# Patient Record
Sex: Female | Born: 1940
Health system: Southern US, Community
[De-identification: ages and names within clinical notes are randomized; demographics above are authoritative.]

## PROBLEM LIST (undated history)

## (undated) DIAGNOSIS — K589 Irritable bowel syndrome without diarrhea: Secondary | ICD-10-CM

## (undated) DIAGNOSIS — Z9289 Personal history of other medical treatment: Secondary | ICD-10-CM

## (undated) DIAGNOSIS — E785 Hyperlipidemia, unspecified: Secondary | ICD-10-CM

## (undated) DIAGNOSIS — R001 Bradycardia, unspecified: Secondary | ICD-10-CM

## (undated) DIAGNOSIS — R197 Diarrhea, unspecified: Secondary | ICD-10-CM

## (undated) DIAGNOSIS — Z860100 Personal history of colon polyps, unspecified: Secondary | ICD-10-CM

## (undated) DIAGNOSIS — I471 Supraventricular tachycardia: Secondary | ICD-10-CM

## (undated) DIAGNOSIS — I4719 Other supraventricular tachycardia: Secondary | ICD-10-CM

## (undated) DIAGNOSIS — E669 Obesity, unspecified: Secondary | ICD-10-CM

## (undated) DIAGNOSIS — K5732 Diverticulitis of large intestine without perforation or abscess without bleeding: Secondary | ICD-10-CM

## (undated) DIAGNOSIS — R931 Abnormal findings on diagnostic imaging of heart and coronary circulation: Secondary | ICD-10-CM

## (undated) DIAGNOSIS — D472 Monoclonal gammopathy: Secondary | ICD-10-CM

## (undated) DIAGNOSIS — K2970 Gastritis, unspecified, without bleeding: Secondary | ICD-10-CM

## (undated) DIAGNOSIS — R413 Other amnesia: Secondary | ICD-10-CM

## (undated) DIAGNOSIS — K449 Diaphragmatic hernia without obstruction or gangrene: Secondary | ICD-10-CM

## (undated) DIAGNOSIS — R296 Repeated falls: Secondary | ICD-10-CM

## (undated) DIAGNOSIS — I2510 Atherosclerotic heart disease of native coronary artery without angina pectoris: Secondary | ICD-10-CM

## (undated) DIAGNOSIS — E8881 Metabolic syndrome: Secondary | ICD-10-CM

## (undated) DIAGNOSIS — I639 Cerebral infarction, unspecified: Secondary | ICD-10-CM

## (undated) DIAGNOSIS — G8929 Other chronic pain: Secondary | ICD-10-CM

## (undated) DIAGNOSIS — I951 Orthostatic hypotension: Secondary | ICD-10-CM

## (undated) DIAGNOSIS — I4891 Unspecified atrial fibrillation: Secondary | ICD-10-CM

## (undated) DIAGNOSIS — G4700 Insomnia, unspecified: Secondary | ICD-10-CM

## (undated) DIAGNOSIS — G4733 Obstructive sleep apnea (adult) (pediatric): Secondary | ICD-10-CM

## (undated) DIAGNOSIS — J189 Pneumonia, unspecified organism: Secondary | ICD-10-CM

## (undated) DIAGNOSIS — G5600 Carpal tunnel syndrome, unspecified upper limb: Secondary | ICD-10-CM

## (undated) DIAGNOSIS — R32 Unspecified urinary incontinence: Secondary | ICD-10-CM

## (undated) DIAGNOSIS — D539 Nutritional anemia, unspecified: Secondary | ICD-10-CM

## (undated) DIAGNOSIS — K219 Gastro-esophageal reflux disease without esophagitis: Secondary | ICD-10-CM

## (undated) DIAGNOSIS — K222 Esophageal obstruction: Secondary | ICD-10-CM

## (undated) DIAGNOSIS — M1990 Unspecified osteoarthritis, unspecified site: Secondary | ICD-10-CM

## (undated) DIAGNOSIS — I482 Chronic atrial fibrillation, unspecified: Secondary | ICD-10-CM

## (undated) DIAGNOSIS — D649 Anemia, unspecified: Secondary | ICD-10-CM

## (undated) DIAGNOSIS — D509 Iron deficiency anemia, unspecified: Secondary | ICD-10-CM

## (undated) DIAGNOSIS — F419 Anxiety disorder, unspecified: Secondary | ICD-10-CM

## (undated) DIAGNOSIS — M549 Dorsalgia, unspecified: Secondary | ICD-10-CM

## (undated) DIAGNOSIS — I5032 Chronic diastolic (congestive) heart failure: Secondary | ICD-10-CM

## (undated) DIAGNOSIS — I10 Essential (primary) hypertension: Secondary | ICD-10-CM

## (undated) DIAGNOSIS — I209 Angina pectoris, unspecified: Secondary | ICD-10-CM

## (undated) DIAGNOSIS — F329 Major depressive disorder, single episode, unspecified: Secondary | ICD-10-CM

## (undated) DIAGNOSIS — F32A Depression, unspecified: Secondary | ICD-10-CM

## (undated) HISTORY — DX: Personal history of other medical treatment: Z92.89

## (undated) HISTORY — PX: DILATION AND CURETTAGE OF UTERUS: SHX78

## (undated) HISTORY — DX: Obesity, unspecified: E66.9

## (undated) HISTORY — DX: Chronic atrial fibrillation, unspecified: I48.20

## (undated) HISTORY — DX: Essential (primary) hypertension: I10

## (undated) HISTORY — DX: Metabolic syndrome: E88.81

## (undated) HISTORY — DX: Diverticulitis of large intestine without perforation or abscess without bleeding: K57.32

## (undated) HISTORY — DX: Hyperlipidemia, unspecified: E78.5

## (undated) HISTORY — DX: Gastritis, unspecified, without bleeding: K29.70

## (undated) HISTORY — DX: Diaphragmatic hernia without obstruction or gangrene: K44.9

## (undated) HISTORY — PX: JOINT REPLACEMENT: SHX530

## (undated) HISTORY — DX: Nutritional anemia, unspecified: D53.9

## (undated) HISTORY — DX: Carpal tunnel syndrome, unspecified upper limb: G56.00

## (undated) HISTORY — DX: Irritable bowel syndrome, unspecified: K58.9

## (undated) HISTORY — DX: Personal history of colonic polyps: Z86.010

## (undated) HISTORY — DX: Depression, unspecified: F32.A

## (undated) HISTORY — DX: Obstructive sleep apnea (adult) (pediatric): G47.33

## (undated) HISTORY — DX: Diarrhea, unspecified: R19.7

## (undated) HISTORY — DX: Esophageal obstruction: K22.2

## (undated) HISTORY — PX: TOTAL KNEE ARTHROPLASTY: SHX125

## (undated) HISTORY — DX: Gastro-esophageal reflux disease without esophagitis: K21.9

## (undated) HISTORY — PX: CATARACT EXTRACTION: SUR2

## (undated) HISTORY — DX: Abnormal findings on diagnostic imaging of heart and coronary circulation: R93.1

## (undated) HISTORY — PX: CORONARY STENT PLACEMENT: SHX1402

## (undated) HISTORY — PX: ESOPHAGOGASTRODUODENOSCOPY (EGD) WITH ESOPHAGEAL DILATION: SHX5812

## (undated) HISTORY — DX: Iron deficiency anemia, unspecified: D50.9

## (undated) HISTORY — PX: BACK SURGERY: SHX140

## (undated) HISTORY — DX: Insomnia, unspecified: G47.00

## (undated) HISTORY — DX: Personal history of colon polyps, unspecified: Z86.0100

## (undated) HISTORY — DX: Angina pectoris, unspecified: I20.9

## (undated) HISTORY — DX: Major depressive disorder, single episode, unspecified: F32.9

## (undated) HISTORY — DX: Other amnesia: R41.3

## (undated) HISTORY — DX: Unspecified urinary incontinence: R32

---

## 1979-08-22 HISTORY — PX: CARPAL TUNNEL RELEASE: SHX101

## 1993-12-21 HISTORY — PX: KNEE ARTHROSCOPY: SHX127

## 1998-04-02 ENCOUNTER — Other Ambulatory Visit: Admission: RE | Admit: 1998-04-02 | Discharge: 1998-04-02 | Payer: Self-pay | Admitting: Obstetrics and Gynecology

## 1998-07-31 ENCOUNTER — Ambulatory Visit (HOSPITAL_COMMUNITY): Admission: RE | Admit: 1998-07-31 | Discharge: 1998-07-31 | Payer: Self-pay | Admitting: Orthopedic Surgery

## 1998-10-07 ENCOUNTER — Encounter: Payer: Self-pay | Admitting: Orthopedic Surgery

## 1998-10-08 ENCOUNTER — Inpatient Hospital Stay (HOSPITAL_COMMUNITY): Admission: RE | Admit: 1998-10-08 | Discharge: 1998-10-10 | Payer: Self-pay | Admitting: Orthopedic Surgery

## 1998-10-10 ENCOUNTER — Inpatient Hospital Stay (HOSPITAL_COMMUNITY)
Admission: RE | Admit: 1998-10-10 | Discharge: 1998-10-16 | Payer: Self-pay | Admitting: Physical Medicine and Rehabilitation

## 1998-10-23 ENCOUNTER — Encounter: Admission: RE | Admit: 1998-10-23 | Discharge: 1999-01-21 | Payer: Self-pay | Admitting: Orthopedic Surgery

## 1999-01-24 ENCOUNTER — Encounter: Admission: RE | Admit: 1999-01-24 | Discharge: 1999-04-24 | Payer: Self-pay | Admitting: Orthopedic Surgery

## 1999-03-07 ENCOUNTER — Emergency Department (HOSPITAL_COMMUNITY): Admission: EM | Admit: 1999-03-07 | Discharge: 1999-03-07 | Payer: Self-pay | Admitting: Emergency Medicine

## 1999-03-07 ENCOUNTER — Encounter: Payer: Self-pay | Admitting: Emergency Medicine

## 1999-03-08 ENCOUNTER — Encounter: Payer: Self-pay | Admitting: Emergency Medicine

## 1999-03-13 ENCOUNTER — Ambulatory Visit (HOSPITAL_COMMUNITY): Admission: RE | Admit: 1999-03-13 | Discharge: 1999-03-13 | Payer: Self-pay | Admitting: Urology

## 1999-03-13 ENCOUNTER — Encounter: Payer: Self-pay | Admitting: Urology

## 1999-03-24 ENCOUNTER — Other Ambulatory Visit: Admission: RE | Admit: 1999-03-24 | Discharge: 1999-03-24 | Payer: Self-pay | Admitting: Obstetrics and Gynecology

## 1999-08-22 HISTORY — PX: CATARACT EXTRACTION W/ INTRAOCULAR LENS  IMPLANT, BILATERAL: SHX1307

## 2000-05-27 ENCOUNTER — Other Ambulatory Visit: Admission: RE | Admit: 2000-05-27 | Discharge: 2000-05-27 | Payer: Self-pay | Admitting: Obstetrics and Gynecology

## 2000-06-15 ENCOUNTER — Encounter: Payer: Self-pay | Admitting: Orthopedic Surgery

## 2000-06-15 ENCOUNTER — Ambulatory Visit (HOSPITAL_COMMUNITY): Admission: RE | Admit: 2000-06-15 | Discharge: 2000-06-15 | Payer: Self-pay | Admitting: Orthopedic Surgery

## 2000-06-29 ENCOUNTER — Encounter: Payer: Self-pay | Admitting: Orthopedic Surgery

## 2000-06-29 ENCOUNTER — Ambulatory Visit (HOSPITAL_COMMUNITY): Admission: RE | Admit: 2000-06-29 | Discharge: 2000-06-29 | Payer: Self-pay | Admitting: Orthopedic Surgery

## 2000-07-14 ENCOUNTER — Ambulatory Visit (HOSPITAL_COMMUNITY): Admission: RE | Admit: 2000-07-14 | Discharge: 2000-07-14 | Payer: Self-pay | Admitting: Orthopedic Surgery

## 2000-07-14 ENCOUNTER — Encounter: Payer: Self-pay | Admitting: Orthopedic Surgery

## 2000-10-23 ENCOUNTER — Emergency Department (HOSPITAL_COMMUNITY): Admission: EM | Admit: 2000-10-23 | Discharge: 2000-10-23 | Payer: Self-pay | Admitting: Emergency Medicine

## 2000-10-23 ENCOUNTER — Encounter: Payer: Self-pay | Admitting: Emergency Medicine

## 2000-10-23 ENCOUNTER — Encounter: Payer: Self-pay | Admitting: Specialist

## 2000-11-16 ENCOUNTER — Encounter: Admission: RE | Admit: 2000-11-16 | Discharge: 2000-12-08 | Payer: Self-pay | Admitting: Specialist

## 2001-01-31 ENCOUNTER — Encounter
Admission: RE | Admit: 2001-01-31 | Discharge: 2001-02-21 | Payer: Self-pay | Admitting: Physical Medicine and Rehabilitation

## 2001-02-23 ENCOUNTER — Ambulatory Visit (HOSPITAL_COMMUNITY): Admission: RE | Admit: 2001-02-23 | Discharge: 2001-02-23 | Payer: Self-pay | Admitting: Gastroenterology

## 2001-02-23 ENCOUNTER — Encounter: Payer: Self-pay | Admitting: Gastroenterology

## 2001-04-13 ENCOUNTER — Ambulatory Visit (HOSPITAL_COMMUNITY): Admission: RE | Admit: 2001-04-13 | Discharge: 2001-04-13 | Payer: Self-pay | Admitting: Specialist

## 2001-04-13 ENCOUNTER — Encounter: Payer: Self-pay | Admitting: Specialist

## 2001-12-06 ENCOUNTER — Inpatient Hospital Stay (HOSPITAL_COMMUNITY): Admission: EM | Admit: 2001-12-06 | Discharge: 2001-12-10 | Payer: Self-pay | Admitting: Emergency Medicine

## 2001-12-06 ENCOUNTER — Encounter: Payer: Self-pay | Admitting: Emergency Medicine

## 2001-12-08 ENCOUNTER — Encounter: Payer: Self-pay | Admitting: Emergency Medicine

## 2001-12-21 HISTORY — PX: LAPAROSCOPIC CHOLECYSTECTOMY: SUR755

## 2001-12-28 ENCOUNTER — Encounter: Admission: RE | Admit: 2001-12-28 | Discharge: 2001-12-28 | Payer: Self-pay | Admitting: Family Medicine

## 2002-01-04 ENCOUNTER — Other Ambulatory Visit: Admission: RE | Admit: 2002-01-04 | Discharge: 2002-01-04 | Payer: Self-pay | Admitting: Obstetrics and Gynecology

## 2002-01-11 ENCOUNTER — Encounter: Payer: Self-pay | Admitting: Surgery

## 2002-01-11 ENCOUNTER — Encounter (INDEPENDENT_AMBULATORY_CARE_PROVIDER_SITE_OTHER): Payer: Self-pay | Admitting: Specialist

## 2002-01-11 ENCOUNTER — Ambulatory Visit (HOSPITAL_COMMUNITY): Admission: RE | Admit: 2002-01-11 | Discharge: 2002-01-12 | Payer: Self-pay | Admitting: Surgery

## 2002-03-25 ENCOUNTER — Ambulatory Visit (HOSPITAL_COMMUNITY): Admission: RE | Admit: 2002-03-25 | Discharge: 2002-03-25 | Payer: Self-pay | Admitting: Family Medicine

## 2002-03-25 ENCOUNTER — Encounter: Payer: Self-pay | Admitting: Family Medicine

## 2002-04-04 ENCOUNTER — Encounter: Payer: Self-pay | Admitting: Orthopedic Surgery

## 2002-04-05 ENCOUNTER — Inpatient Hospital Stay (HOSPITAL_COMMUNITY): Admission: RE | Admit: 2002-04-05 | Discharge: 2002-04-11 | Payer: Self-pay | Admitting: Orthopedic Surgery

## 2002-04-11 ENCOUNTER — Inpatient Hospital Stay (HOSPITAL_COMMUNITY)
Admission: AD | Admit: 2002-04-11 | Discharge: 2002-04-15 | Payer: Self-pay | Admitting: Physical Medicine & Rehabilitation

## 2002-05-04 ENCOUNTER — Encounter: Admission: RE | Admit: 2002-05-04 | Discharge: 2002-06-15 | Payer: Self-pay | Admitting: Orthopedic Surgery

## 2003-10-25 ENCOUNTER — Encounter (INDEPENDENT_AMBULATORY_CARE_PROVIDER_SITE_OTHER): Payer: Self-pay | Admitting: Gastroenterology

## 2004-03-29 ENCOUNTER — Ambulatory Visit (HOSPITAL_COMMUNITY): Admission: RE | Admit: 2004-03-29 | Discharge: 2004-03-29 | Payer: Self-pay | Admitting: Neurology

## 2004-04-13 ENCOUNTER — Emergency Department (HOSPITAL_COMMUNITY): Admission: EM | Admit: 2004-04-13 | Discharge: 2004-04-13 | Payer: Self-pay | Admitting: Emergency Medicine

## 2004-05-08 ENCOUNTER — Ambulatory Visit (HOSPITAL_COMMUNITY): Admission: RE | Admit: 2004-05-08 | Discharge: 2004-05-08 | Payer: Self-pay | Admitting: Gastroenterology

## 2004-06-10 ENCOUNTER — Encounter: Admission: RE | Admit: 2004-06-10 | Discharge: 2004-06-10 | Payer: Self-pay | Admitting: Specialist

## 2004-08-28 ENCOUNTER — Emergency Department (HOSPITAL_COMMUNITY): Admission: EM | Admit: 2004-08-28 | Discharge: 2004-08-28 | Payer: Self-pay | Admitting: Emergency Medicine

## 2004-08-28 ENCOUNTER — Inpatient Hospital Stay (HOSPITAL_COMMUNITY): Admission: AD | Admit: 2004-08-28 | Discharge: 2004-09-02 | Payer: Self-pay | Admitting: Internal Medicine

## 2004-08-29 ENCOUNTER — Encounter: Payer: Self-pay | Admitting: Cardiology

## 2004-09-25 ENCOUNTER — Ambulatory Visit (HOSPITAL_COMMUNITY): Admission: RE | Admit: 2004-09-25 | Discharge: 2004-09-25 | Payer: Self-pay | Admitting: Cardiology

## 2004-12-01 ENCOUNTER — Encounter: Admission: RE | Admit: 2004-12-01 | Discharge: 2004-12-29 | Payer: Self-pay | Admitting: Neurosurgery

## 2005-03-10 ENCOUNTER — Ambulatory Visit: Admission: RE | Admit: 2005-03-10 | Discharge: 2005-03-10 | Payer: Self-pay | Admitting: Family Medicine

## 2005-08-31 ENCOUNTER — Ambulatory Visit (HOSPITAL_COMMUNITY): Admission: RE | Admit: 2005-08-31 | Discharge: 2005-08-31 | Payer: Self-pay | Admitting: Gastroenterology

## 2005-09-03 ENCOUNTER — Ambulatory Visit: Payer: Self-pay | Admitting: Gastroenterology

## 2005-09-07 ENCOUNTER — Ambulatory Visit: Payer: Self-pay | Admitting: Cardiology

## 2006-09-21 ENCOUNTER — Ambulatory Visit: Payer: Self-pay | Admitting: Cardiology

## 2006-10-06 ENCOUNTER — Ambulatory Visit: Payer: Self-pay | Admitting: Pulmonary Disease

## 2006-10-12 ENCOUNTER — Encounter: Payer: Self-pay | Admitting: Cardiology

## 2006-10-12 ENCOUNTER — Ambulatory Visit: Payer: Self-pay

## 2006-11-03 ENCOUNTER — Ambulatory Visit (HOSPITAL_BASED_OUTPATIENT_CLINIC_OR_DEPARTMENT_OTHER): Admission: RE | Admit: 2006-11-03 | Discharge: 2006-11-03 | Payer: Self-pay | Admitting: Cardiology

## 2006-11-13 ENCOUNTER — Ambulatory Visit: Payer: Self-pay | Admitting: Pulmonary Disease

## 2006-11-23 ENCOUNTER — Ambulatory Visit: Payer: Self-pay | Admitting: Pulmonary Disease

## 2006-12-06 ENCOUNTER — Ambulatory Visit: Payer: Self-pay | Admitting: Cardiology

## 2007-07-07 ENCOUNTER — Ambulatory Visit: Payer: Self-pay | Admitting: Pulmonary Disease

## 2007-08-02 ENCOUNTER — Ambulatory Visit (HOSPITAL_BASED_OUTPATIENT_CLINIC_OR_DEPARTMENT_OTHER): Admission: RE | Admit: 2007-08-02 | Discharge: 2007-08-02 | Payer: Self-pay | Admitting: Pulmonary Disease

## 2007-08-02 ENCOUNTER — Ambulatory Visit: Payer: Self-pay | Admitting: Pulmonary Disease

## 2007-08-18 ENCOUNTER — Ambulatory Visit: Payer: Self-pay | Admitting: Cardiology

## 2007-10-07 ENCOUNTER — Ambulatory Visit: Payer: Self-pay | Admitting: Pulmonary Disease

## 2007-10-07 ENCOUNTER — Encounter: Payer: Self-pay | Admitting: Internal Medicine

## 2007-10-31 ENCOUNTER — Ambulatory Visit: Payer: Self-pay | Admitting: Pulmonary Disease

## 2007-11-28 ENCOUNTER — Ambulatory Visit: Payer: Self-pay | Admitting: Cardiology

## 2008-01-09 ENCOUNTER — Ambulatory Visit (HOSPITAL_COMMUNITY): Admission: RE | Admit: 2008-01-09 | Discharge: 2008-01-09 | Payer: Self-pay | Admitting: Cardiology

## 2008-01-09 ENCOUNTER — Ambulatory Visit: Payer: Self-pay | Admitting: Cardiology

## 2008-04-18 ENCOUNTER — Ambulatory Visit: Payer: Self-pay | Admitting: Cardiology

## 2008-04-18 LAB — CONVERTED CEMR LAB
BUN: 14 mg/dL (ref 6–23)
Basophils Absolute: 0 10*3/uL (ref 0.0–0.1)
Basophils Relative: 0 % (ref 0.0–1.0)
CO2: 29 meq/L (ref 19–32)
Calcium: 9.1 mg/dL (ref 8.4–10.5)
Chloride: 102 meq/L (ref 96–112)
Creatinine, Ser: 1 mg/dL (ref 0.4–1.2)
Eosinophils Absolute: 0.2 10*3/uL (ref 0.0–0.7)
Eosinophils Relative: 2.5 % (ref 0.0–5.0)
GFR calc Af Amer: 71 mL/min
GFR calc non Af Amer: 59 mL/min
Glucose, Bld: 145 mg/dL — ABNORMAL HIGH (ref 70–99)
HCT: 39.4 % (ref 36.0–46.0)
Hemoglobin: 13.1 g/dL (ref 12.0–15.0)
INR: 1 (ref 0.8–1.0)
Lymphocytes Relative: 39.3 % (ref 12.0–46.0)
MCHC: 33.4 g/dL (ref 30.0–36.0)
MCV: 92.4 fL (ref 78.0–100.0)
Monocytes Absolute: 0.3 10*3/uL (ref 0.1–1.0)
Monocytes Relative: 4.3 % (ref 3.0–12.0)
Neutro Abs: 3.7 10*3/uL (ref 1.4–7.7)
Neutrophils Relative %: 53.9 % (ref 43.0–77.0)
Platelets: 219 10*3/uL (ref 150–400)
Potassium: 3.6 meq/L (ref 3.5–5.1)
Prothrombin Time: 12.1 s (ref 10.9–13.3)
RBC: 4.26 M/uL (ref 3.87–5.11)
RDW: 12.9 % (ref 11.5–14.6)
Sodium: 139 meq/L (ref 135–145)
WBC: 7 10*3/uL (ref 4.5–10.5)
aPTT: 30.4 s — ABNORMAL HIGH (ref 21.7–29.8)

## 2008-04-25 ENCOUNTER — Inpatient Hospital Stay (HOSPITAL_COMMUNITY): Admission: AD | Admit: 2008-04-25 | Discharge: 2008-04-26 | Payer: Self-pay | Admitting: Cardiology

## 2008-04-25 ENCOUNTER — Ambulatory Visit: Payer: Self-pay | Admitting: Cardiology

## 2008-05-01 ENCOUNTER — Encounter: Payer: Self-pay | Admitting: Cardiology

## 2008-05-01 ENCOUNTER — Ambulatory Visit: Payer: Self-pay

## 2008-05-02 ENCOUNTER — Ambulatory Visit: Payer: Self-pay | Admitting: Cardiology

## 2008-05-08 ENCOUNTER — Ambulatory Visit: Payer: Self-pay | Admitting: Cardiology

## 2008-05-08 ENCOUNTER — Ambulatory Visit: Payer: Self-pay

## 2008-05-08 LAB — CONVERTED CEMR LAB
Basophils Absolute: 0.1 10*3/uL (ref 0.0–0.1)
Basophils Relative: 0.9 % (ref 0.0–1.0)
Eosinophils Absolute: 0.2 10*3/uL (ref 0.0–0.7)
Eosinophils Relative: 3.3 % (ref 0.0–5.0)
HCT: 35.2 % — ABNORMAL LOW (ref 36.0–46.0)
Hemoglobin: 11.9 g/dL — ABNORMAL LOW (ref 12.0–15.0)
Lymphocytes Relative: 33.4 % (ref 12.0–46.0)
MCHC: 33.7 g/dL (ref 30.0–36.0)
MCV: 90.4 fL (ref 78.0–100.0)
Monocytes Absolute: 0.4 10*3/uL (ref 0.1–1.0)
Monocytes Relative: 7.1 % (ref 3.0–12.0)
Neutro Abs: 3.4 10*3/uL (ref 1.4–7.7)
Neutrophils Relative %: 55.3 % (ref 43.0–77.0)
Platelets: 201 10*3/uL (ref 150–400)
RBC: 3.9 M/uL (ref 3.87–5.11)
RDW: 12.7 % (ref 11.5–14.6)
WBC: 6.1 10*3/uL (ref 4.5–10.5)

## 2008-05-22 DIAGNOSIS — I10 Essential (primary) hypertension: Secondary | ICD-10-CM | POA: Insufficient documentation

## 2008-05-22 DIAGNOSIS — E669 Obesity, unspecified: Secondary | ICD-10-CM | POA: Insufficient documentation

## 2008-05-22 DIAGNOSIS — G4700 Insomnia, unspecified: Secondary | ICD-10-CM | POA: Insufficient documentation

## 2008-05-22 DIAGNOSIS — F32A Depression, unspecified: Secondary | ICD-10-CM | POA: Insufficient documentation

## 2008-05-22 DIAGNOSIS — F329 Major depressive disorder, single episode, unspecified: Secondary | ICD-10-CM | POA: Insufficient documentation

## 2008-05-22 DIAGNOSIS — E785 Hyperlipidemia, unspecified: Secondary | ICD-10-CM | POA: Insufficient documentation

## 2008-05-23 ENCOUNTER — Ambulatory Visit: Payer: Self-pay | Admitting: Pulmonary Disease

## 2008-05-23 DIAGNOSIS — R0602 Shortness of breath: Secondary | ICD-10-CM | POA: Insufficient documentation

## 2008-05-23 DIAGNOSIS — G4733 Obstructive sleep apnea (adult) (pediatric): Secondary | ICD-10-CM | POA: Insufficient documentation

## 2008-06-20 ENCOUNTER — Encounter (INDEPENDENT_AMBULATORY_CARE_PROVIDER_SITE_OTHER): Payer: Self-pay | Admitting: *Deleted

## 2008-06-20 ENCOUNTER — Ambulatory Visit: Payer: Self-pay | Admitting: Pulmonary Disease

## 2008-06-28 ENCOUNTER — Ambulatory Visit (HOSPITAL_COMMUNITY): Admission: RE | Admit: 2008-06-28 | Discharge: 2008-06-28 | Payer: Self-pay | Admitting: Pulmonary Disease

## 2008-06-28 ENCOUNTER — Encounter: Payer: Self-pay | Admitting: Internal Medicine

## 2008-07-04 ENCOUNTER — Ambulatory Visit: Payer: Self-pay | Admitting: Internal Medicine

## 2008-07-05 ENCOUNTER — Ambulatory Visit: Payer: Self-pay | Admitting: Cardiology

## 2008-08-07 ENCOUNTER — Ambulatory Visit: Payer: Self-pay | Admitting: Pulmonary Disease

## 2008-09-05 ENCOUNTER — Ambulatory Visit: Payer: Self-pay | Admitting: Cardiology

## 2008-11-12 DIAGNOSIS — K2980 Duodenitis without bleeding: Secondary | ICD-10-CM | POA: Insufficient documentation

## 2008-11-12 DIAGNOSIS — K5730 Diverticulosis of large intestine without perforation or abscess without bleeding: Secondary | ICD-10-CM | POA: Insufficient documentation

## 2008-11-12 DIAGNOSIS — Z860100 Personal history of colon polyps, unspecified: Secondary | ICD-10-CM | POA: Insufficient documentation

## 2008-11-12 DIAGNOSIS — J986 Disorders of diaphragm: Secondary | ICD-10-CM | POA: Insufficient documentation

## 2008-11-12 DIAGNOSIS — K222 Esophageal obstruction: Secondary | ICD-10-CM | POA: Insufficient documentation

## 2008-11-12 DIAGNOSIS — K219 Gastro-esophageal reflux disease without esophagitis: Secondary | ICD-10-CM | POA: Insufficient documentation

## 2008-11-12 DIAGNOSIS — K2900 Acute gastritis without bleeding: Secondary | ICD-10-CM | POA: Insufficient documentation

## 2008-11-12 DIAGNOSIS — K449 Diaphragmatic hernia without obstruction or gangrene: Secondary | ICD-10-CM

## 2008-11-13 ENCOUNTER — Ambulatory Visit: Payer: Self-pay | Admitting: Gastroenterology

## 2008-11-13 DIAGNOSIS — K589 Irritable bowel syndrome without diarrhea: Secondary | ICD-10-CM | POA: Insufficient documentation

## 2008-11-13 LAB — CONVERTED CEMR LAB: Tissue Transglutaminase Ab, IgA: 0.1 units (ref <7)

## 2008-11-14 ENCOUNTER — Telehealth: Payer: Self-pay | Admitting: Gastroenterology

## 2008-11-14 LAB — CONVERTED CEMR LAB
Basophils Absolute: 0 10*3/uL (ref 0.0–0.1)
Basophils Relative: 0.5 % (ref 0.0–3.0)
Eosinophils Absolute: 0.1 10*3/uL (ref 0.0–0.7)
Eosinophils Relative: 2.2 % (ref 0.0–5.0)
Ferritin: 7.8 ng/mL — ABNORMAL LOW (ref 10.0–291.0)
Folate: >20 ng/mL
HCT: 37.4 % (ref 36.0–46.0)
Hemoglobin: 12.8 g/dL (ref 12.0–15.0)
Iron: 42 ug/dL (ref 42–145)
Lymphocytes Relative: 38.4 % (ref 12.0–46.0)
MCHC: 34.3 g/dL (ref 30.0–36.0)
MCV: 92.2 fL (ref 78.0–100.0)
Monocytes Absolute: 0.4 10*3/uL (ref 0.1–1.0)
Monocytes Relative: 6.6 % (ref 3.0–12.0)
Neutro Abs: 3.6 10*3/uL (ref 1.4–7.7)
Neutrophils Relative %: 52.3 % (ref 43.0–77.0)
Platelets: 188 10*3/uL (ref 150–400)
RBC: 4.06 M/uL (ref 3.87–5.11)
RDW: 12.2 % (ref 11.5–14.6)
Saturation Ratios: 11.6 % — ABNORMAL LOW (ref 20.0–50.0)
Sed Rate: 23 mm/hr — ABNORMAL HIGH (ref 0–22)
Transferrin: 258.3 mg/dL (ref >=212.0)
Vitamin B-12: 559 pg/mL (ref 211–911)
WBC: 6.7 10*3/uL (ref 4.5–10.5)

## 2008-11-20 ENCOUNTER — Encounter: Payer: Self-pay | Admitting: Gastroenterology

## 2008-11-20 ENCOUNTER — Ambulatory Visit: Payer: Self-pay | Admitting: Gastroenterology

## 2008-11-26 ENCOUNTER — Telehealth: Payer: Self-pay | Admitting: Gastroenterology

## 2008-12-06 ENCOUNTER — Ambulatory Visit: Payer: Self-pay | Admitting: Gastroenterology

## 2008-12-06 ENCOUNTER — Encounter: Payer: Self-pay | Admitting: Gastroenterology

## 2008-12-06 ENCOUNTER — Ambulatory Visit (HOSPITAL_COMMUNITY): Admission: RE | Admit: 2008-12-06 | Discharge: 2008-12-06 | Payer: Self-pay | Admitting: Gastroenterology

## 2008-12-07 ENCOUNTER — Encounter: Payer: Self-pay | Admitting: Gastroenterology

## 2009-01-17 ENCOUNTER — Ambulatory Visit: Payer: Self-pay | Admitting: Gastroenterology

## 2009-01-17 DIAGNOSIS — R197 Diarrhea, unspecified: Secondary | ICD-10-CM | POA: Insufficient documentation

## 2009-04-04 DIAGNOSIS — G4721 Circadian rhythm sleep disorder, delayed sleep phase type: Secondary | ICD-10-CM | POA: Insufficient documentation

## 2009-04-04 DIAGNOSIS — G5600 Carpal tunnel syndrome, unspecified upper limb: Secondary | ICD-10-CM | POA: Insufficient documentation

## 2009-04-04 DIAGNOSIS — E669 Obesity, unspecified: Secondary | ICD-10-CM | POA: Insufficient documentation

## 2009-04-08 ENCOUNTER — Encounter: Payer: Self-pay | Admitting: Cardiology

## 2009-04-08 ENCOUNTER — Ambulatory Visit: Payer: Self-pay | Admitting: Cardiology

## 2009-04-17 ENCOUNTER — Encounter: Payer: Self-pay | Admitting: Cardiology

## 2009-04-17 ENCOUNTER — Encounter (INDEPENDENT_AMBULATORY_CARE_PROVIDER_SITE_OTHER): Payer: Self-pay

## 2009-04-17 LAB — CONVERTED CEMR LAB
ALT: 28 units/L
AST: 24 units/L
Alkaline Phosphatase: 77 units/L
BUN: 12 mg/dL
CO2: 25 meq/L
Calcium: 9.4 mg/dL
Chloride: 102 meq/L
Creatinine, Ser: 0.95 mg/dL
Glucose, Bld: 106 mg/dL
Potassium: 4.5 meq/L
Sodium: 138 meq/L
Total Bilirubin: 0.4 mg/dL

## 2009-04-30 ENCOUNTER — Encounter (INDEPENDENT_AMBULATORY_CARE_PROVIDER_SITE_OTHER): Payer: Self-pay

## 2009-09-16 ENCOUNTER — Encounter: Payer: Self-pay | Admitting: Cardiology

## 2009-10-25 ENCOUNTER — Encounter: Payer: Self-pay | Admitting: Cardiology

## 2009-11-05 ENCOUNTER — Ambulatory Visit: Payer: Self-pay | Admitting: Cardiology

## 2010-01-28 ENCOUNTER — Encounter: Admission: RE | Admit: 2010-01-28 | Discharge: 2010-04-28 | Payer: Self-pay | Admitting: Specialist

## 2010-03-05 ENCOUNTER — Encounter: Payer: Self-pay | Admitting: Cardiology

## 2010-03-10 ENCOUNTER — Ambulatory Visit (HOSPITAL_COMMUNITY): Admission: RE | Admit: 2010-03-10 | Discharge: 2010-03-10 | Payer: Self-pay | Admitting: Otolaryngology

## 2010-03-24 ENCOUNTER — Telehealth: Payer: Self-pay | Admitting: Gastroenterology

## 2010-04-08 ENCOUNTER — Ambulatory Visit: Payer: Self-pay | Admitting: Gastroenterology

## 2010-04-08 DIAGNOSIS — Z8669 Personal history of other diseases of the nervous system and sense organs: Secondary | ICD-10-CM | POA: Insufficient documentation

## 2010-04-08 DIAGNOSIS — M1990 Unspecified osteoarthritis, unspecified site: Secondary | ICD-10-CM | POA: Insufficient documentation

## 2010-04-10 LAB — CONVERTED CEMR LAB
ALT: 18 units/L (ref 0–35)
AST: 19 units/L (ref 0–37)
Albumin: 3.7 g/dL (ref 3.5–5.2)
Alkaline Phosphatase: 62 units/L (ref 39–117)
BUN: 13 mg/dL (ref 6–23)
Basophils Absolute: 0 10*3/uL (ref 0.0–0.1)
Basophils Relative: 0.4 % (ref 0.0–3.0)
Bilirubin, Direct: 0.1 mg/dL (ref 0.0–0.3)
CO2: 31 meq/L (ref 19–32)
CRP, High Sensitivity: 0.8 (ref 0.00–5.00)
Calcium: 9.2 mg/dL (ref 8.4–10.5)
Chloride: 104 meq/L (ref 96–112)
Creatinine, Ser: 0.9 mg/dL (ref 0.4–1.2)
Eosinophils Absolute: 0.1 10*3/uL (ref 0.0–0.7)
Eosinophils Relative: 1.1 % (ref 0.0–5.0)
Ferritin: 17.9 ng/mL (ref 10.0–291.0)
Folate: >20 ng/mL
GFR calc non Af Amer: 65.67 mL/min (ref >60)
Glucose, Bld: 92 mg/dL (ref 70–99)
HCT: 36.7 % (ref 36.0–46.0)
Hemoglobin: 12.4 g/dL (ref 12.0–15.0)
IgA: 170 mg/dL (ref 68–378)
Iron: 76 ug/dL (ref 42–145)
Lymphocytes Relative: 30.1 % (ref 12.0–46.0)
Lymphs Abs: 2.3 10*3/uL (ref 0.7–4.0)
MCHC: 33.7 g/dL (ref 30.0–36.0)
MCV: 93 fL (ref 78.0–100.0)
Magnesium: 1.9 mg/dL (ref 1.5–2.5)
Monocytes Absolute: 0.4 10*3/uL (ref 0.1–1.0)
Monocytes Relative: 6 % (ref 3.0–12.0)
Neutro Abs: 4.7 10*3/uL (ref 1.4–7.7)
Neutrophils Relative %: 62.4 % (ref 43.0–77.0)
Platelets: 213 10*3/uL (ref 150.0–400.0)
Potassium: 4 meq/L (ref 3.5–5.1)
RBC: 3.95 M/uL (ref 3.87–5.11)
RDW: 13.4 % (ref 11.5–14.6)
Rheumatoid fact SerPl-aCnc: <20 intl units/mL (ref 0.0–20.0)
Saturation Ratios: 26.2 % (ref 20.0–50.0)
Sed Rate: 25 mm/hr — ABNORMAL HIGH (ref 0–22)
Sodium: 142 meq/L (ref 135–145)
TSH: 1.82 microintl units/mL (ref 0.35–5.50)
Total Bilirubin: 0.4 mg/dL (ref 0.3–1.2)
Total Protein: 6.7 g/dL (ref 6.0–8.3)
Transferrin: 207 mg/dL — ABNORMAL LOW (ref 212.0–360.0)
Vitamin B-12: 591 pg/mL (ref 211–911)
WBC: 7.5 10*3/uL (ref 4.5–10.5)

## 2010-04-14 ENCOUNTER — Telehealth: Payer: Self-pay | Admitting: Gastroenterology

## 2010-04-14 LAB — CONVERTED CEMR LAB: Anti Nuclear Antibody(ANA): NEGATIVE

## 2010-04-16 ENCOUNTER — Encounter: Payer: Self-pay | Admitting: Gastroenterology

## 2010-04-19 ENCOUNTER — Encounter: Admission: RE | Admit: 2010-04-19 | Discharge: 2010-04-19 | Payer: Self-pay | Admitting: Family Medicine

## 2010-06-19 ENCOUNTER — Encounter: Admission: RE | Admit: 2010-06-19 | Discharge: 2010-06-19 | Payer: Self-pay | Admitting: Specialist

## 2010-06-24 ENCOUNTER — Encounter: Payer: Self-pay | Admitting: Cardiology

## 2010-06-25 ENCOUNTER — Ambulatory Visit: Payer: Self-pay | Admitting: Cardiology

## 2010-06-25 DIAGNOSIS — R609 Edema, unspecified: Secondary | ICD-10-CM | POA: Insufficient documentation

## 2010-07-04 ENCOUNTER — Ambulatory Visit: Payer: Self-pay

## 2010-07-04 ENCOUNTER — Ambulatory Visit (HOSPITAL_COMMUNITY): Admission: RE | Admit: 2010-07-04 | Discharge: 2010-07-04 | Payer: Self-pay | Admitting: Cardiology

## 2010-07-04 ENCOUNTER — Encounter: Payer: Self-pay | Admitting: Cardiology

## 2010-07-04 ENCOUNTER — Ambulatory Visit: Payer: Self-pay | Admitting: Cardiovascular Disease

## 2010-07-04 ENCOUNTER — Ambulatory Visit: Payer: Self-pay | Admitting: Cardiology

## 2010-10-28 ENCOUNTER — Ambulatory Visit: Payer: Self-pay | Admitting: Gastroenterology

## 2010-10-28 LAB — CONVERTED CEMR LAB
Basophils Absolute: 0 10*3/uL (ref 0.0–0.1)
Bilirubin, Direct: 0.1 mg/dL (ref 0.0–0.3)
CRP, High Sensitivity: 2.88 (ref 0.00–5.00)
Calcium: 9.3 mg/dL (ref 8.4–10.5)
Creatinine, Ser: 0.9 mg/dL (ref 0.4–1.2)
Eosinophils Absolute: 0.1 10*3/uL (ref 0.0–0.7)
Ferritin: 14.3 ng/mL (ref 10.0–291.0)
GFR calc non Af Amer: 63.13 mL/min (ref >60)
HCT: 35.5 % — ABNORMAL LOW (ref 36.0–46.0)
Hemoglobin: 12.2 g/dL (ref 12.0–15.0)
IgA: 141 mg/dL (ref 68–378)
Iron: 51 ug/dL (ref 42–145)
Lymphs Abs: 2 10*3/uL (ref 0.7–4.0)
MCHC: 34.3 g/dL (ref 30.0–36.0)
Neutro Abs: 4.3 10*3/uL (ref 1.4–7.7)
RDW: 12.8 % (ref 11.5–14.6)
Total Bilirubin: 0.3 mg/dL (ref 0.3–1.2)
Transferrin: 245.5 mg/dL (ref 212.0–360.0)
Vitamin B-12: 504 pg/mL (ref 211–911)

## 2010-10-29 ENCOUNTER — Encounter: Payer: Self-pay | Admitting: Gastroenterology

## 2010-10-31 ENCOUNTER — Ambulatory Visit: Payer: Self-pay | Admitting: Cardiology

## 2010-11-05 ENCOUNTER — Encounter: Payer: Self-pay | Admitting: Gastroenterology

## 2010-11-06 ENCOUNTER — Telehealth (INDEPENDENT_AMBULATORY_CARE_PROVIDER_SITE_OTHER): Payer: Self-pay | Admitting: *Deleted

## 2010-12-02 ENCOUNTER — Encounter: Payer: Self-pay | Admitting: Gastroenterology

## 2011-01-06 ENCOUNTER — Encounter: Payer: Self-pay | Admitting: Cardiology

## 2011-01-22 NOTE — Miscellaneous (Signed)
Summary: Orders Update  Clinical Lists Changes  Orders: Added new Test order of T-2 View CXR (71020TC) - Signed 

## 2011-01-22 NOTE — Letter (Signed)
Summary: Karie Soda MD/[facility]  Karie Soda MD/[facility]   Imported By: Lester  01/09/2011 09:43:53  _____________________________________________________________________  External Attachment:    Type:   Image     Comment:   External Document

## 2011-01-22 NOTE — Progress Notes (Signed)
Summary: Triage  Phone Note Call from Patient Call back at Home Phone [phone]   Caller: Patient Call For: Dr. Jarold Motto Reason for Call: Talk to Nurse Summary of Call: Pt was referred to a Neurologist on 05-12-10 and wants to know if she can get a sooner appt. Initial call taken by: Karna Christmas,  April 14, 2010 3:58 PM  Follow-up for Phone Call        Pt. will call his office to see if she can be put on the cx. list. Follow-up by: Teryl Lucy RN,  April 14, 2010 4:24 PM

## 2011-01-22 NOTE — Letter (Signed)
Summary: [facility]   Imported By: Lennie Odor 12/03/2010 16:12:55  _____________________________________________________________________  External Attachment:    Type:   Image     Comment:   External Document

## 2011-01-22 NOTE — Letter (Signed)
Summary: [facility]  [facility]   Imported By: Sherian Rein 04/25/2010 13:11:41  _____________________________________________________________________  External Attachment:    Type:   Image     Comment:   External Document

## 2011-01-22 NOTE — Assessment & Plan Note (Signed)
Summary: hernia--ch.   History of Present Illness Visit Type: Follow-up Visit Primary GI MD: Sheryn Bison MD FACP FAGA Primary Provider: Rudi Heap, MD Requesting Provider: Rudi Heap, MD Chief Complaint: Ongoing acid reflux symptoms that pt describes them as buring sensation and hoarseness that she has seen a ENT about. She keeps saying she doesn't know why she is here.  History of Present Illness:   70 year old Caucasian female with chronic gastroesophageal reflux disease manifested mostly by hoarseness, coughing, and throat clearing. She underwent endoscopy and colonoscopy approximately year ago and is doing well on daily Nexium 40 mg before bedtime. She recently saw ENT who of course diagnosed acid reflux causing pharyngitis and she took twice a day Nexium for one month's time. Today her main complaints are one of multiple neurological issues including loss of balance, ataxia, proximal muscular weakness in her legs, intentional tremor, general malaise, increased depression, sleep disorder, fatigue, and multiple somatic complaints.  She is on 15 different medications and is chronically on Plavix. She does take Crestor and WelChol, p.r.n. hydrocodone, fentanyl patch, Lunesta at bedtime, Prozac, Benicar, potassium chloride, and calcium with vitamin D. She has chronic back pain and is under the care of neurosurgery and orthopedics. Apparently neurosurgery has been denied because of her other multiple medical problems. She denies abuse of cigarettes, alcohol, or NSAIDs.she denies constipation, melena, hematochezia, or any specific hepatobiliary complaints.   GI Review of Systems    Reports acid reflux and  heartburn.      Denies abdominal pain, belching, bloating, chest pain, dysphagia with liquids, dysphagia with solids, loss of appetite, nausea, vomiting, vomiting blood, weight loss, and  weight gain.      Reports diarrhea.     Denies anal fissure, black tarry stools, change in bowel  habit, constipation, diverticulosis, fecal incontinence, heme positive stool, hemorrhoids, irritable bowel syndrome, jaundice, light color stool, liver problems, rectal bleeding, and  rectal pain.    Current Medications (verified): 1)  Norvasc 2.5 Mg  Tabs (Amlodipine Besylate) .... Take Once Daily.Marland Kitchen 2)  Lasix 40 Mg  Tabs (Furosemide) .... Take One Tablet Daily 3)  Plavix 75 Mg  Tabs (Clopidogrel Bisulfate) .... One Tablet Daily. 4)  Crestor 10 Mg  Tabs (Rosuvastatin Calcium) .... One Tablet Daily. 5)  Welchol 625 Mg  Tabs (Colesevelam Hcl) .... Take One Tablet Two Times A Day 6)  Nexium 40 Mg  Cpdr (Esomeprazole Magnesium) .... One Tablet Daily 7)  Hydrocodone-Acetaminophen 10-325 Mg  Tabs (Hydrocodone-Acetaminophen) .... One Tablet Two Times A Day. As Needed 8)  Fentanyl 50 Mcg/hr  Pt72 (Fentanyl) .... Use One Patch For Three Days. 9)  Lunesta 3 Mg  Tabs (Eszopiclone) .... Use One Tablet At Bedtime 10)  Prozac 20 Mg Caps (Fluoxetine Hcl) .Marland Kitchen.. 1 Tablet By Mouth Once Daily 11)  Vitamin C 500 Mg  Tabs (Ascorbic Acid) .Marland Kitchen.. 1 Tablet By Mouth Once Daily 12)  Multivitamins   Tabs (Multiple Vitamin) .Marland Kitchen.. 1 Tablet By Mouth Once Daily 13)  Benicar 20 Mg Tabs (Olmesartan Medoxomil) .... Take 1/2 Tablet Once Daily 14)  Klor-Con M20 20 Meq Cr-Tabs (Potassium Chloride Crys Cr) .... Take 1 Tablet By Mouth Once A Day 15)  Calcium 500 Mg Tabs (Calcium Carbonate) .... Take 1 Tablet By Mouth Two Times A Day  Allergies (verified): 1)  ! Percocet  Past History:  Past medical, surgical, family and social histories (including risk factors) reviewed for relevance to current acute and chronic problems.  Past Medical History: Reviewed history from 04/04/2009  and no changes required. OVERWEIGHT/OBESITY (ICD-278.02) CIRCADIAN RHYTHM SLEEP D/O DELAY SLEEP PHSE TYPE (ICD-327.31) CARPAL TUNNEL SYNDROME (ICD-354.0) DIASTOLIC HEART FAILURE, CHRONIC (ICD-428.32) CAD, UNSPECIFIED SITE (ICD-414.00) DIARRHEA  (ICD-787.91) IRRITABLE BOWEL SYNDROME (ICD-564.1) DIVERTICULOSIS, COLON (ICD-562.10) DUODENITIS, WITHOUT HEMORRHAGE (ICD-535.60) GASTRITIS, ACUTE (ICD-535.00) ESOPHAGEAL STRICTURE (ICD-530.3) COLONIC POLYPS, HX OF (ICD-V12.72) HIATAL HERNIA (ICD-553.3) GERD (ICD-530.81) DYSPNEA (ICD-786.05) OBSTRUCTIVE SLEEP APNEA (ICD-327.23) INSOMNIA (ICD-780.52) OBESITY (ICD-278.00) HYPERTENSION (ICD-401.9) HYPERLIPIDEMIA (ICD-272.4) DEPRESSION (ICD-311)    Past Surgical History: Reviewed history from 05/23/2008 and no changes required. Carpal Tunnel Release Cholecystectomy 01/03, Dr. Ovidio Kin Right total Knee Arthroplasty 04/03, Dr. Trudee Grip  Family History: Reviewed history from 04/08/2009 and no changes required. Father - CAD, PVD Mother - CAD, tetanus Brother - CAD, PVD, Emphysema Sister - Colon cancer, Emphysema Son - OSA  Social History: Reviewed history from 01/17/2009 and no changes required. Widowed.  One son, one daughter.  Retired Comptroller.  Disabled due to arthritis.  No history of alcohol or tobacco abuse. Daily Caffeine Use  Review of Systems       The patient complains of arthritis/joint pain, back pain, confusion, and fatigue.  The patient denies allergy/sinus, anemia, anxiety-new, blood in urine, breast changes/lumps, change in vision, cough, coughing up blood, depression-new, fainting, fever, headaches-new, hearing problems, heart murmur, heart rhythm changes, itching, menstrual pain, muscle pains/cramps, night sweats, nosebleeds, pregnancy symptoms, shortness of breath, skin rash, sleeping problems, swelling of feet/legs, swollen lymph glands, thirst - excessive , urination - excessive , urination changes/pain, urine leakage, vision changes, and voice change.   General:  Complains of fatigue, weakness, and malaise; denies fever, chills, sweats, anorexia, weight loss, and sleep disorder. Eyes:  Denies blurring, diplopia, irritation, discharge, vision loss,  scotoma, eye pain, and photophobia. ENT:  Complains of sore throat and hoarseness; denies earache, ear discharge, tinnitus, decreased hearing, nasal congestion, loss of smell, nosebleeds, and difficulty swallowing. CV:  Denies chest pains, angina, palpitations, syncope, dyspnea on exertion, orthopnea, PND, peripheral edema, and claudication. Resp:  Denies dyspnea at rest, dyspnea with exercise, cough, sputum, wheezing, coughing up blood, and pleurisy. GI:  Denies difficulty swallowing, pain on swallowing, nausea, indigestion/heartburn, vomiting, vomiting blood, abdominal pain, jaundice, gas/bloating, diarrhea, constipation, change in bowel habits, bloody BM's, black BMs, and fecal incontinence. GU:  Denies urinary burning, blood in urine, nocturnal urination, urinary frequency, urinary incontinence, abnormal vaginal bleeding, amenorrhea, menorrhagia, vaginal discharge, pelvic pain, genital sores, painful intercourse, and decreased libido. MS:  Complains of joint stiffness, low back pain, and muscle weakness; denies joint pain / LOM, joint swelling, joint deformity, muscle cramps, muscle atrophy, leg pain at night, leg pain with exertion, and shoulder pain / LOM hand / wrist pain (CTS). Neuro:  Complains of weakness, abnormal sensation, tremors, and difficulty walking; denies paralysis, seizures, syncope, vertigo, transient blindness, frequent falls, frequent headaches, headache, sciatica, radiculopathy other:, restless legs, memory loss, and confusion. Psych:  Complains of depression and memory loss; denies anxiety, suicidal ideation, hallucinations, paranoia, phobia, and confusion. Endo:  Complains of cold intolerance; denies heat intolerance, polydipsia, polyphagia, polyuria, unusual weight change, and hirsutism. Heme:  Denies bruising, bleeding, enlarged lymph nodes, and pagophagia.  Vital Signs:  Patient profile:   70 year old female Height:      67 inches Weight:      212.50 pounds BMI:      33.40 Pulse rate:   70 / minute Pulse rhythm:   regular BP sitting:   124 / 76  (right arm) Cuff size:   large  Vitals Entered  By: Christie Nottingham CMA Duncan Dull) (April 08, 2010 3:04 PM)  Physical Exam  General:  Well developed, well nourished, no acute distress.Spontaneous crying throughout the interview and exam. Head:  Normocephalic and atraumatic. Eyes:  PERRLA, no icterus.exam deferred to patient's ophthalmologist.   Mouth:  No deformity or lesions, dentition normal. Neck:  Supple; no masses or thyromegaly. Lungs:  Clear throughout to auscultation. Heart:  Regular rate and rhythm; no murmurs, rubs,  or bruits. Abdomen:  Soft, nontender and nondistended. No masses, hepatosplenomegaly or hernias noted. Normal bowel sounds. Msk:  Symmetrical with no gross deformities. Normal posture.arthritic changes.   Extremities:  No clubbing, cyanosis, edema or deformities noted.trace pedal edema.   Neurologic:  ataxia.  I cannot appreciate any specific proximal muscular weakness, tremor, or other neurologic dysfunction except for generalized ataxia. Cerebellar test and otherwise appears normal. Her mental status is entirely normal. Cervical Nodes:  No significant cervical adenopathy. Psych:  depressed affect and agitated.     Impression & Recommendations:  Problem # 1:  OTHER DISORDERS OF NERVOUS SYSTEM&SENSE ORGANS (ICD-V12.49) Assessment Deteriorated Multiple labs ordered to exclude vasculitis, autoimmune disorders, nutritional deficiencies. I have referred her to neurology for evaluation and also will check thyroid function tests. I suspect most of her problems are related to worsening depression, and dependency on hydrocodone and fentanyl. She is on statin medications and I have ordered aldolase and CPK levels.  Problem # 2:  IRRITABLE BOWEL SYNDROME (ICD-564.1) Assessment: Improved  Problem # 3:  GERD (ICD-530.81) Assessment: Improved Reflux regime reviewed with patient and she is to take  her Nexium before supper in the evening with twice a day usage if needed. She is not a good candidate for fundoplication surgery per her other multiple medical problems and medications. Orders: TLB-CBC Platelet - w/Differential (85025-CBCD) TLB-BMP (Basic Metabolic Panel-BMET) (80048-METABOL) TLB-Hepatic/Liver Function Pnl (80076-HEPATIC) TLB-TSH (Thyroid Stimulating Hormone) (84443-TSH) TLB-B12, Serum-Total ONLY (66063-K16) TLB-Folic Acid (Folate) (82746-FOL) TLB-Ferritin (82728-FER) TLB-IBC Pnl (Iron/FE;Transferrin) (83550-IBC) TLB-CRP-High Sensitivity (C-Reactive Protein) (86140-FCRP) TLB-Magnesium (Mg) (83735-MG) TLB-Rheumatoid Factor (RA) (01093-AT) TLB-Sedimentation Rate (ESR) (85652-ESR) T-ANA (55732-20254) TLB-IgA (Immunoglobulin A) (82784-IGA) T-Sprue Panel (Celiac Disease Aby Eval) (83516x3/[address]) T- * Misc. Laboratory test [phone])  Problem # 4:  DEPRESSION (ICD-311) Assessment: Deteriorated Consider psychiatric referral depending on clinical course and workup. Orders: TLB-CBC Platelet - w/Differential (85025-CBCD) TLB-BMP (Basic Metabolic Panel-BMET) (80048-METABOL) TLB-Hepatic/Liver Function Pnl (80076-HEPATIC) TLB-TSH (Thyroid Stimulating Hormone) (84443-TSH) TLB-B12, Serum-Total ONLY (37628-B15) TLB-Folic Acid (Folate) (82746-FOL) TLB-Ferritin (82728-FER) TLB-IBC Pnl (Iron/FE;Transferrin) (83550-IBC) TLB-CRP-High Sensitivity (C-Reactive Protein) (86140-FCRP) TLB-Magnesium (Mg) (83735-MG) TLB-Rheumatoid Factor (RA) (17616-WV) TLB-Sedimentation Rate (ESR) (85652-ESR) T-ANA (37106-26948) TLB-IgA (Immunoglobulin A) (82784-IGA) T-Sprue Panel (Celiac Disease Aby Eval) (83516x3/[address]) T- * Misc. Laboratory test [phone])  Patient Instructions: 1)  Please go to the basement for lab work. 2)  You are being referred to a neurologist for evaluation of weakness and tremor. 3)  Please continue current medications.  4)  The medication list was reviewed and  reconciled.  All changed / newly prescribed medications were explained.  A complete medication list was provided to the patient / caregiver. 5)  Copy sent to : Dr. Rudi Heap in [facility] 6)  Please continue current medications.  7)  Avoid foods high in acid content ( tomatoes, citrus juices, spicy foods) . Avoid eating within 3 to 4 hours of lying down or before exercising. Do not over eat; try smaller more frequent meals. Elevate head of bed four inches when sleeping.   Appended Document: hernia--ch.  Clinical Lists Changes  Orders: Added new Test order of TLB-CK Total Only(Creatine Kinase/CPK) (82550-CK) - Signed Added new Test order of T-Aldolase ([id number]) - Signed

## 2011-01-22 NOTE — Assessment & Plan Note (Addendum)
Summary: ROV   Visit Type:  Follow-up Referring Provider:  Rudi Heap, MD Primary Provider:  Rudi Heap, MD  CC:  Edema -Sob-Shaking.  History of Present Illness: [name] [name] is in for a follow up visit, and she has had recurrence of many of her symptoms.  She complains of exhaustion, fatigue, and shortness of breath.  She was doing much better for quite some time, but now is back to similar status.  She shakes quite a bit.  She admits to alot of stress.  She is accompanied by her daughter who seems perhaps even more terrified by the prospects of her mother being ill.  We had an extensive discussion in the clinic today about past workup, and evaluation.  Not having chest pain at rest, or even really with exertion to speak of.  She has been recently plagued by low back issues, and has also been evaluated by the neuro service for much of the same things.  Has also seen Dr. Jarold Motto, and seen Dr. Ethelene Hal for treatment.  Labs were done in Dr. Kathi Der office yesterday, and look pretty good.    Current Medications (verified): 1)  Norvasc 2.5 Mg  Tabs (Amlodipine Besylate) .... Take Once Daily.Marland Kitchen 2)  Lasix 40 Mg  Tabs (Furosemide) .... Take One Tablet Daily 3)  Plavix 75 Mg  Tabs (Clopidogrel Bisulfate) .... One Tablet Daily. 4)  Crestor 10 Mg  Tabs (Rosuvastatin Calcium) .... Take 1 Tablet By Mouth Every Other Day 5)  Welchol 625 Mg  Tabs (Colesevelam Hcl) .... Take One Tablet Two Times A Day 6)  Nexium 40 Mg  Cpdr (Esomeprazole Magnesium) .... One Tablet Daily 7)  Hydrocodone-Acetaminophen 10-325 Mg  Tabs (Hydrocodone-Acetaminophen) .... One Tablet Two Times A Day. As Needed 8)  Fentanyl 50 Mcg/hr  Pt72 (Fentanyl) .... Use One Patch For Three Days. 9)  Lunesta 3 Mg  Tabs (Eszopiclone) .... Use One Tablet At Bedtime 10)  Prozac 40 Mg Caps (Fluoxetine Hcl) .... Take 1 Tablet By Mouth Once A Day 11)  Vitamin C 500 Mg  Tabs (Ascorbic Acid) .Marland Kitchen.. 1 Tablet By Mouth Once Daily 12)  Multivitamins   Tabs  (Multiple Vitamin) .Marland Kitchen.. 1 Tablet By Mouth Once Daily 13)  Benicar 20 Mg Tabs (Olmesartan Medoxomil) .... Take 1/2 Tablet Once Daily 14)  Klor-Con M20 20 Meq Cr-Tabs (Potassium Chloride Crys Cr) .... Take 1 Tablet By Mouth Once A Day 15)  Calcium 500 Mg Tabs (Calcium Carbonate) .... Take 1 Tablet By Mouth Two Times A Day 16)  Crestor 20 Mg Tabs (Rosuvastatin Calcium) .... Take One Tablet By Mouth Every Other Day  Allergies: 1)  ! Percocet 2)  ! * Cymbalta  Past History:  Past Medical History: Last updated: 04/04/2009 OVERWEIGHT/OBESITY (ICD-278.02) CIRCADIAN RHYTHM SLEEP D/O DELAY SLEEP PHSE TYPE (ICD-327.31) CARPAL TUNNEL SYNDROME (ICD-354.0) DIASTOLIC HEART FAILURE, CHRONIC (ICD-428.32) CAD, UNSPECIFIED SITE (ICD-414.00) DIARRHEA (ICD-787.91) IRRITABLE BOWEL SYNDROME (ICD-564.1) DIVERTICULOSIS, COLON (ICD-562.10) DUODENITIS, WITHOUT HEMORRHAGE (ICD-535.60) GASTRITIS, ACUTE (ICD-535.00) ESOPHAGEAL STRICTURE (ICD-530.3) COLONIC POLYPS, HX OF (ICD-V12.72) HIATAL HERNIA (ICD-553.3) GERD (ICD-530.81) DYSPNEA (ICD-786.05) OBSTRUCTIVE SLEEP APNEA (ICD-327.23) INSOMNIA (ICD-780.52) OBESITY (ICD-278.00) HYPERTENSION (ICD-401.9) HYPERLIPIDEMIA (ICD-272.4) DEPRESSION (ICD-311)    Past Surgical History: Last updated: 05/23/2008 Carpal Tunnel Release Cholecystectomy 01/03, Dr. Ovidio Kin Right total Knee Arthroplasty 04/03, Dr. Trudee Grip  Family History: Last updated: 04/08/2009 Father - CAD, PVD Mother - CAD, tetanus Brother - CAD, PVD, Emphysema Sister - Colon cancer, Emphysema Son - OSA  Social History: Last updated: 01/17/2009 Widowed.  One son,  one daughter.  Retired Comptroller.  Disabled due to arthritis.  No history of alcohol or tobacco abuse. Daily Caffeine Use  Vital Signs:  Patient profile:   70 year old female Height:      67 inches Weight:      220.25 pounds BMI:     34.62 O2 Sat:      97 % on Room air Pulse rate:   74 / minute Pulse rhythm:    regular Resp:     20 per minute BP sitting:   116 / 64  (left arm) Cuff size:   large  Vitals Entered By: Vikki Ports (June 25, 2010 4:38 PM)  O2 Flow:  Room air  Physical Exam  General:  anxious, overweight white female in no major distress.  Appears a bit jittery, and a bit tearful. Head:  normocephalic and atraumatic Eyes:  PERRLA/EOM intact; conjunctiva and lids normal. Lungs:  Clear bilaterally to auscultation and percussion. Heart:  PMI non displaced.  Normal S1 and S2.  No definite murmur or rub.  No diastolic murmurs.   Abdomen:  Bowel sounds positive; abdomen soft and non-tender without masses, organomegaly, or hernias noted. No hepatosplenomegaly. Pulses:  pulses normal in all 4 extremities Extremities:  Puffy, but no major edema.  Neurologic:  Alert and oriented x 3.   EKG  Procedure date:  06/25/2010  Findings:      NSR.  WNL.  No acute ST or T wave changes.   MRI EXAM  Procedure date:  04/19/2010  Findings:       IMPRESSION:   1.  Multilevel lumbar spondylosis.  Central stenosis most   pronounced at L4-L5 associated with broad-based disc extrusion and   facet hypertrophy along with ligamentum flavum redundancy.  Left   greater than right lateral recess and foraminal stenosis.   2.  L3-L4 mild to moderate central stenosis with bilateral lateral   recess stenosis.  Moderate bilateral foraminal stenosis is roughly   symmetric.   3.  L5-S1 desiccated and degenerated disc with endplate   osteophytes.  Crowding of the lateral recesses without neural   compression.   4.  L2-L3 right lateral recess narrowing with the flexion of the   right descending L3 nerve root.    Read By:  Wynn Banker   Released By:  Andreas Newport T.  UGI  Procedure date:  03/10/2010  Findings:       IMPRESSION:   There is marked esophageal dysmotility with tertiary contractions   and delayed emptying of the esophagus.  There is a large hiatal   paraesophageal hernia  measures 9.7 x 6.3 cm.  No evidence of   gastric volvulus.  No gastroesophageal reflux was noted.  Reflux   was noted from distal esophagus in proximal esophagus.    Fluoroscopy time was 3.5 minutes.    Read By:  Kennieth Francois,  M.D.  CT Scan  Procedure date:  03/10/2010  Findings:       CT CHEST    Findings: There is no superior mediastinal lesion.  There is a   hiatal hernia.  The patient has rather extensive coronary artery   calcification.  No mass or adenopathy in the region.  The lungs are   clear.  No pleural or pericardial fluid.  Scans in the upper   abdomen show no significant lesion.    IMPRESSION:   No pathologic finding in the chest relating to the course of the   recurrent  laryngeal nerve.    Rather extensive coronary artery calcification.    Read By:  Thomasenia Sales,  M.D.  Cardiac Cath  Procedure date:  09/01/2004  Findings:       ANGIOGRAPHIC DATA:  1.  Ventriculography was performed in the RAO projection.  Overall systolic      function was preserved.  No segmental abnormalities or contracture were      identified.  2.  The coronary arteries were heavily calcified.  The right coronary was      particularly calcified.  However, no highly obstructive lesions were      noted.  3.  The left main was free of critical disease.  4.  The LAD coursed to the apex.  There is about 30-40% narrowing at most      just beyond the origin of the major diagonal.  The major diagonal itself      has about 40-50% ostial narrowing, but looks no worse than on the      previous angiogram.  The mid and distal LAD and the distal diagonal      branch are without critical narrowing.  5.  The circumflex has about 40% narrowing at the ostium.  There is a tiny      insignificant first marginal branch with probably 50% narrowing.  The      remainder of the vessel has luminal irregularity, but not significant      focal stenosis.  6.  The right coronary artery is a heavily  calcified vessel.  There is      probably no more than about 30-40% mid narrowing in the right coronary.      No other high grade lesions are identified.   CONCLUSION:  1.  Well preserved left ventricular function.  2.  Heavily calcified coronary arteries without significant focal      obstruction.   Impression & Recommendations:  Problem # 1:  DYSPNEA (ICD-786.05) Her main issue for cardiology today is that she has alot of dyspnea.  She also seems quite anxious about a number of things, and this could be playing a component. She has extensive calcification noted on CT, and by cath in 2005, had extensive calcification of the coronaries without significant obstruction.  The goal has been to be agressive with her prevention, although her BMI remains high.  She was tearful today, and she does not have chest pain.  Quite mindful that dyspnea could be a presenting symptom, I would prefer presently to follow her closely for a short bit, and recommend cath if she is not settled down symptom wise.  2 D echo would be valuable to eliminate WMA, or give other compellling reason to proceed, and CXR to eliminate other findings.  She has multiple issues, as noted with upper GI, and neuro status with MRI as noted.   Her updated medication list for this problem includes:    Norvasc 2.5 Mg Tabs (Amlodipine besylate) .Marland Kitchen... Take once daily..    Lasix 40 Mg Tabs (Furosemide) .Marland Kitchen... Take one tablet daily    Benicar 20 Mg Tabs (Olmesartan medoxomil) .Marland Kitchen... Take 1/2 tablet once daily  Orders: EKG w/ Interpretation (93000) Echocardiogram (Echo)  Problem # 2:  EDEMA (ICD-782.3) Not really extensive at this point Orders: EKG w/ Interpretation (93000) Echocardiogram (Echo)  Problem # 3:  HYPERTENSION, BENIGN (ICD-401.1) currently controlled at this point.   Her updated medication list for this problem includes:    Norvasc 2.5 Mg Tabs (Amlodipine besylate) .Marland KitchenMarland KitchenMarland KitchenMarland Kitchen  Take once daily..    Lasix 40 Mg Tabs (Furosemide)  .Marland Kitchen... Take one tablet daily    Benicar 20 Mg Tabs (Olmesartan medoxomil) .Marland Kitchen... Take 1/2 tablet once daily  Problem # 4:  HYPERCHOLESTEROLEMIA  IIA (ICD-272.0) being followed closely by Dr. Christell Constant Her updated medication list for this problem includes:    Crestor 10 Mg Tabs (Rosuvastatin calcium) .Marland Kitchen... Take 1 tablet by mouth every other day    Welchol 625 Mg Tabs (Colesevelam hcl) .Marland Kitchen... Take one tablet two times a day    Crestor 20 Mg Tabs (Rosuvastatin calcium) .Marland Kitchen... Take one tablet by mouth every other day  Patient Instructions: 1)  A chest x-ray takes a picture of the organs and structures inside the chest, including the heart, lungs, and blood vessels. This test can show several things, including, whether the heart is enlarged; whether fluid is building up in the lungs; and whether pacemaker / defibrillator leads are still in place. 2)  Your physician has requested that you have an echocardiogram.  Echocardiography is a painless test that uses sound waves to create images of your heart. It provides your doctor with information about the size and shape of your heart and how well your heart's chambers and valves are working.  This procedure takes approximately one hour. There are no restrictions for this procedure. 3)  Please decrease the salt in your diet.  4)  Your physician recommends that you schedule a follow-up appointment in: 2 MONTHS

## 2011-01-22 NOTE — Letter (Signed)
Summary: [facility]   Imported By: Lester [place] Lucas 12/16/2010 10:29:24  _____________________________________________________________________  External Attachment:    Type:   Image     Comment:   External Document

## 2011-01-22 NOTE — Progress Notes (Signed)
Summary: Change MD at [facility]  Phone Note Outgoing Call   Call placed by: Harlow Mares CMA [facility]),  November 06, 2010 10:03 AM Call placed to: Patient Summary of Call: called pt to cx her appt for next week since she has already been refered to [facility], she states that she did not like  Dr. Ezzard Standing and would not be returning to see him and asked if she could be refered to another MD. I have called [facility] and asked to have her change MDs they have changed her one the MDs who do this type of hernia. patient now has an appt with Dr. Michaell Cowing 12/02/2010 arriving at 10:15am for a 10:20am appt. Patient is aware but states that she can not do morning appts and she wants it resch it to the afternoon, I gave her the number for [facility] and advised her that she can call herself and resch her appt if needed I already had her appt changed to a different MD. She verablized understanding. Initial call taken by: Harlow Mares CMA [facility]),  November 06, 2010 10:09 AM

## 2011-01-22 NOTE — Assessment & Plan Note (Signed)
Summary: FOLLOW UP/YF   History of Present Illness Visit Type: Follow-up Visit Primary GI MD: Sheryn Bison MD FACP FAGA Primary Provider: Rudi Heap, MD Requesting Provider: na Chief Complaint: chronic abdominal pain, dysphagia, gas History of Present Illness:   Very Complex 70 year old white female with multiple medical problems 15 different medications including chronic fentanyl patch for  back pain.She Continues to Complain of Gas, bloating, and abdominal pain in the entire left side of her abdomen with associated diarrhea but no rectal bleeding. She denies any specific food intolerances, anorexia, weight loss, food intolerance, history of hepatitis or pancreatitis.  She had previous endoscopy and colonoscopy with random colon biopsies which were unremarkable. She continues on Nexium 40 mg a day for chronic GERD. She is with her daughter today who has severe IBS herself. Patient is tearful and agitated and obviously unhappy. Because of her multiple neurological complaints, would refer her to neurology, and she had a negative neurologic evaluation. She also has noncardiac chest pain. I think some of her current problem is withdrawal from hydrocodone which she is taken for many years. She has chronic anxiety and depression, obesity, and sleep apnea. She apparently had a psychotic break with Cymbalta therapy. It is unclear to me if this patient is under psychiatric therapy and care.  She says that within minutes of eating she has generalized abdominal discomfort and rectal urgency. She has not tried Imodium or probiotics. She has not had previous hepatitis or pancreatitis, and despite these complaints has had no anorexia or weight loss. Her daughter has such severe IBS we had refer to [facility] for therapy. The patient apparently has had previous laproscopic cholecystectomy. There is no history of severe peripheral vascular disease.   GI Review of Systems    Reports abdominal pain, acid  reflux, bloating, dysphagia with solids, loss of appetite, and  nausea.     Location of  Abdominal pain: left side.    Denies belching, chest pain, dysphagia with liquids, heartburn, vomiting, vomiting blood, weight loss, and  weight gain.      Reports diarrhea, fecal incontinence, and  light color stool.     Denies anal fissure, black tarry stools, change in bowel habit, constipation, diverticulosis, heme positive stool, hemorrhoids, irritable bowel syndrome, jaundice, liver problems, rectal bleeding, and  rectal pain.    Current Medications (verified): 1)  Norvasc 2.5 Mg  Tabs (Amlodipine Besylate) .... Take Once Daily.Marland Kitchen 2)  Lasix 40 Mg  Tabs (Furosemide) .... Take One Tablet Daily 3)  Plavix 75 Mg  Tabs (Clopidogrel Bisulfate) .... One Tablet Daily. 4)  Crestor 10 Mg  Tabs (Rosuvastatin Calcium) .... Take 1 Tablet By Mouth Every Other Day 5)  Welchol 625 Mg  Tabs (Colesevelam Hcl) .... Take One Tablet Two Times A Day 6)  Nexium 40 Mg  Cpdr (Esomeprazole Magnesium) .... One Tablet Daily 7)  Hydrocodone-Acetaminophen 10-325 Mg  Tabs (Hydrocodone-Acetaminophen) .... One Tablet Two Times A Day. As Needed 8)  Fentanyl 50 Mcg/hr  Pt72 (Fentanyl) .... Use One Patch For Three Days. 9)  Lunesta 3 Mg  Tabs (Eszopiclone) .... Use One Tablet At Bedtime 10)  Prozac 40 Mg Caps (Fluoxetine Hcl) .... Take 1 Tablet By Mouth Once A Day 11)  Vitamin C 500 Mg  Tabs (Ascorbic Acid) .Marland Kitchen.. 1 Tablet By Mouth Once Daily 12)  Multivitamins   Tabs (Multiple Vitamin) .Marland Kitchen.. 1 Tablet By Mouth Once Daily 13)  Benicar 20 Mg Tabs (Olmesartan Medoxomil) .... Take 1/2 Tablet Once Daily 14)  Klor-Con M20 20 Meq Cr-Tabs (Potassium Chloride Crys Cr) .... Take 1 Tablet By Mouth Once A Day 15)  Calcium 500 Mg Tabs (Calcium Carbonate) .... Take 1 Tablet By Mouth Two Times A Day  Allergies (verified): 1)  ! Percocet 2)  ! * Cymbalta  Past History:  Past medical, surgical, family and social histories (including risk factors)  reviewed for relevance to current acute and chronic problems.  Past Medical History: Reviewed history from 04/04/2009 and no changes required. OVERWEIGHT/OBESITY (ICD-278.02) CIRCADIAN RHYTHM SLEEP D/O DELAY SLEEP PHSE TYPE (ICD-327.31) CARPAL TUNNEL SYNDROME (ICD-354.0) DIASTOLIC HEART FAILURE, CHRONIC (ICD-428.32) CAD, UNSPECIFIED SITE (ICD-414.00) DIARRHEA (ICD-787.91) IRRITABLE BOWEL SYNDROME (ICD-564.1) DIVERTICULOSIS, COLON (ICD-562.10) DUODENITIS, WITHOUT HEMORRHAGE (ICD-535.60) GASTRITIS, ACUTE (ICD-535.00) ESOPHAGEAL STRICTURE (ICD-530.3) COLONIC POLYPS, HX OF (ICD-V12.72) HIATAL HERNIA (ICD-553.3) GERD (ICD-530.81) DYSPNEA (ICD-786.05) OBSTRUCTIVE SLEEP APNEA (ICD-327.23) INSOMNIA (ICD-780.52) OBESITY (ICD-278.00) HYPERTENSION (ICD-401.9) HYPERLIPIDEMIA (ICD-272.4) DEPRESSION (ICD-311)    Past Surgical History: Reviewed history from 05/23/2008 and no changes required. Carpal Tunnel Release Cholecystectomy 01/03, Dr. Ovidio Kin Right total Knee Arthroplasty 04/03, Dr. Trudee Grip  Family History: Reviewed history from 04/08/2009 and no changes required. Father - CAD, PVD Mother - CAD, tetanus Brother - CAD, PVD, Emphysema Sister - Colon cancer, Emphysema Son - OSA  Social History: Reviewed history from 01/17/2009 and no changes required. Widowed.  One son, one daughter.  Retired Comptroller.  Disabled due to arthritis.  No history of alcohol or tobacco abuse. Daily Caffeine Use  Review of Systems       The patient complains of arthritis/joint pain, back pain, depression-new, and fatigue.  The patient denies allergy/sinus, anemia, anxiety-new, blood in urine, breast changes/lumps, change in vision, confusion, cough, coughing up blood, fainting, fever, headaches-new, hearing problems, heart murmur, heart rhythm changes, itching, menstrual pain, muscle pains/cramps, night sweats, nosebleeds, pregnancy symptoms, shortness of breath, skin rash, sleeping problems,  sore throat, swelling of feet/legs, swollen lymph glands, thirst - excessive , urination - excessive , urination changes/pain, urine leakage, vision changes, and voice change.   General:  Complains of fatigue and weakness; denies fever, chills, sweats, anorexia, malaise, weight loss, and sleep disorder. CV:  Complains of dyspnea on exertion; denies chest pains, angina, palpitations, syncope, orthopnea, PND, peripheral edema, and claudication; previous extensive cardiac evaluations have been unremarkable without evidence of progressive cardiovascular coronary artery disease.Marland Kitchen Resp:  Complains of dyspnea with exercise; denies dyspnea at rest, cough, sputum, wheezing, coughing up blood, and pleurisy. GI:  Complains of difficulty swallowing, indigestion/heartburn, abdominal pain, gas/bloating, and diarrhea; denies pain on swallowing, nausea, vomiting, vomiting blood, jaundice, constipation, change in bowel habits, bloody BM's, black BMs, and fecal incontinence. Neuro:  Complains of weakness and radiculopathy other:; denies paralysis, abnormal sensation, seizures, syncope, tremors, vertigo, transient blindness, frequent falls, frequent headaches, difficulty walking, headache, sciatica, restless legs, memory loss, and confusion. Psych:  Complains of depression and anxiety; denies memory loss, suicidal ideation, hallucinations, paranoia, phobia, and confusion. Heme:  Complains of bruising; denies bleeding, enlarged lymph nodes, and pagophagia.  Vital Signs:  Patient profile:   70 year old female Height:      67 inches Weight:      214.38 pounds BMI:     33.70 Pulse rate:   60 / minute Pulse rhythm:   regular BP sitting:   122 / 68  (left arm) Cuff size:   regular  Vitals Entered By: June McMurray CMA Duncan Dull) (October 28, 2010 4:05 PM)  Physical Exam  General:  Well developed, well nourished, no acute distress.Agitated  and tearful white female who has no acute distress in terms of medical  problems. Head:  Normocephalic and atraumatic. Eyes:  PERRLA, no icterus. Lungs:  Clear throughout to auscultation. Heart:  Regular rate and rhythm; no murmurs, rubs,  or bruits. Abdomen:  Distended abdomen with mild hepatosplenomegaly noted but no definite ascites. Bowel sounds are very active and high pitched. There are no abdominal bruits noted. Rectal:  Normal exam.Formed stool in the rectal vault which is guaiac negative. Again,, her stool is formed and not liquidy or diarrhea like Msk:  Limited ambulation and movement because of chronic back pain which is generalized in nature. Extremities:  trace pedal edema.   Neurologic:  Alert and  oriented x4;  grossly normal neurologically. Psych:  anxious, easily distracted, poor concentration, and agitated.     Impression & Recommendations:  Problem # 1:  IRRITABLE BOWEL SYNDROME (ICD-564.1) Assessment Deteriorated I will be surprised if we uncover a different diagnosis in this patient. I think a lot of her problems currently are related to withdrawal from narcotics, particularly hydrocodone. She continues on fentanyl patch. She also is on Lunesta, Prozac, and has had reactions in the past to Cymbalta. She is markedly obese and has has not had weight loss despite her multiple complaints. I think she has IBS with some rapid intestinal transit related to her previous cholecystectomy. To complete her workup I have ordered CT scan of the abdomen and pelvis, repeat screening labs, stool fecalelastase-1, and will prescribe Colestid 1 g daily, p.r.n. tramadol 50 mg every 6-8 hours in place of hydrocodone, probiotic therapy, and consider referral to a tertiary Medical Center for repeat GI evaluation and psychiatric evaluation.She Does have mild hepatomegaly and probably has underlying fatty liver disease, and perhaps occult cirrhosis. Again CT scan labs have been ordered. There is no history of previous alcohol abuse.  Problem # 2:  DYSPNEA  (ICD-786.05) Assessment: Unchanged this appears related to her obesity and deconditioning. She had cardiac evaluation with Dr. Riley Kill, pulmonary evaluation with Dr. Shan Levans, and thorough neurological evaluation recently. For now she is to continue her Norvasc, Lasix, Plavix, Crestor, Benicar, and potassium supplementation. She is followed medically by Dr. Rudi Heap. She is a very difficult patient obviously.  Problem # 3:  OTHER DISORDERS OF NERVOUS SYSTEM&SENSE ORGANS (ICD-V12.49) Assessment: Deteriorated Exact neurologic diagnosis remains unclear, and exactly why she is on fentanyl may remains unclear.  Problem # 4:  DEGENERATIVE JOINT DISEASE (ICD-715.90) Assessment: Unchanged related to her obesity.  Problem # 5:  DIARRHEA (ICD-787.91) Assessment: Unchanged It is of note, her stool rectal exam today he is firm and hard. Repeat GI valuation for structural lesion that malabsorption again has been ordered as mentioned above. Orders: T- * Misc. Laboratory test [phone]) TLB-CBC Platelet - w/Differential (85025-CBCD) TLB-BMP (Basic Metabolic Panel-BMET) (80048-METABOL) TLB-Hepatic/Liver Function Pnl (80076-HEPATIC) TLB-TSH (Thyroid Stimulating Hormone) (84443-TSH) TLB-B12, Serum-Total ONLY (62130-Q65) TLB-Ferritin (82728-FER) TLB-Folic Acid (Folate) (82746-FOL) TLB-IBC Pnl (Iron/FE;Transferrin) (83550-IBC) TLB-Amylase (82150-AMYL) TLB-Lipase (83690-LIPASE) TLB-CRP-High Sensitivity (C-Reactive Protein) (86140-FCRP) TLB-Sedimentation Rate (ESR) (85652-ESR) T-igA (78469) T-Sprue Panel (Celiac Disease Aby Eval) (83516x3/[address]) CT Abdomen/Pelvis with Contrast (CT Abd/Pelvis w/con)  Patient Instructions: 1)  Copy sent to : Rudi Heap, MD 2)  Your Ct Scan is scheduled for 10/31/2010, please follow seperate instructions.  3)  Your prescription(s) have been sent to you pharmacy.  4)  Buy Align OTC and take one a day.  5)  Please go to the basement today for your labs.  6)   The medication list  was reviewed and reconciled.  All changed / newly prescribed medications were explained.  A complete medication list was provided to the patient / caregiver. Prescriptions: TRAMADOL HCL 50 MG TABS (TRAMADOL HCL) take one by mouth as needed  #60 x 0   Entered by:   Harlow Mares CMA (AAMA)   Authorized by:   Mardella Layman MD [facility]   Signed by:   Mardella Layman MD [facility] on 10/28/2010   Method used:   Electronically to        [facility] [phone]* (retail)       [address] [place], [place]  [address]       Ph: [phone] or [phone]       Fax: [phone]   RxID:   [id number] COLESTID 1 GM TABS (COLESTIPOL HCL) take one by mouth once daily  #30 x 6   Entered by:   Harlow Mares CMA (AAMA)   Authorized by:   Mardella Layman MD [facility]   Signed by:   Mardella Layman MD [facility] on 10/28/2010   Method used:   Electronically to        [facility] [phone]* (retail)       [address] [place], [place]  [address]       Ph: [phone] or [phone]       Fax: [phone]   RxID:   [id number]

## 2011-01-22 NOTE — Progress Notes (Signed)
Summary: switch doctors  Phone Note From Other Clinic Call back at [phone]   Caller: Debbie from Dr Rudi Heap office Call For: Jarold Motto Reason for Call: Schedule Patient Appt Summary of Call: Patient wants to switch from Dr Jarold Motto from Dr Christella Hartigan because she saw Dr Christella Hartigan last for a procedure and thinks she might need the same procedure and wants him to do it  Initial call taken by: Tawni Levy,  March 24, 2010 2:28 PM  Follow-up for Phone Call        ok Follow-up by: Mardella Layman MD Clementeen Graham,  March 25, 2010 8:44 AM  Additional Follow-up for Phone Call Additional follow up Details #1::        she should see Dr Jarold Motto to assess whether she needs repeat procedure (she just had colon/EGD 2 years ago, not sure she is going to really need them repeated) Additional Follow-up by: Rachael Fee MD,  March 25, 2010 9:30 AM    Additional Follow-up for Phone Call Additional follow up Details #2::    LM for pt to call... Lupita Leash Surface RN  March 26, 2010 3:28 PM  Debbie informed.  She will talk to pt. Follow-up by: Ashok Cordia RN,  March 26, 2010 3:54 PM

## 2011-02-06 ENCOUNTER — Telehealth: Payer: Self-pay | Admitting: Gastroenterology

## 2011-02-17 NOTE — Progress Notes (Signed)
Summary: Appt sooner than first avail  Phone Note From Other Clinic   Caller: Debbie @ Dr [facility] office [phone] Call For: Dr Jarold Motto Reason for Call: Schedule Patient Appt Summary of Call: Wants pt seen sooner tahn first avail on 02-19-11 for throat burning x1wk feels like someone sprayed pepper in her throat. Initial call taken by: Leanor Kail [facility],  February 06, 2011 8:29 AM  Follow-up for Phone Call        [facility] for Debbie @ [facility] office to call back. Graciella Freer RN  February 06, 2011 11:19 AM     Spoke with patient since Dr Kathi Der office did not call back. Patient was very apologetic stating they made an appointment with an ENT and she was diagnosed w/ Shingles and doesn't need to see [place]. Follow-up by: Graciella Freer RN,  February 09, 2011 3:27 PM

## 2011-03-07 ENCOUNTER — Emergency Department (HOSPITAL_COMMUNITY)
Admission: EM | Admit: 2011-03-07 | Discharge: 2011-03-07 | Disposition: A | Discharge status: Home / Self Care | Payer: 59 | Attending: Emergency Medicine | Admitting: Emergency Medicine

## 2011-03-07 DIAGNOSIS — F411 Generalized anxiety disorder: Secondary | ICD-10-CM | POA: Insufficient documentation

## 2011-03-07 DIAGNOSIS — I10 Essential (primary) hypertension: Secondary | ICD-10-CM | POA: Insufficient documentation

## 2011-03-07 DIAGNOSIS — Z79899 Other long term (current) drug therapy: Secondary | ICD-10-CM | POA: Insufficient documentation

## 2011-03-07 DIAGNOSIS — E7800 Pure hypercholesterolemia, unspecified: Secondary | ICD-10-CM | POA: Insufficient documentation

## 2011-03-07 DIAGNOSIS — M79609 Pain in unspecified limb: Secondary | ICD-10-CM | POA: Insufficient documentation

## 2011-03-07 LAB — CBC
MCH: 30.4 pg (ref 26.0–34.0)
MCHC: 34.8 g/dL (ref 30.0–36.0)
Platelets: 187 10*3/uL (ref 150–400)
RDW: 12.8 % (ref 11.5–15.5)

## 2011-03-07 LAB — POCT I-STAT, CHEM 8
Creatinine, Ser: 0.9 mg/dL (ref 0.4–1.2)
HCT: 37 % (ref 36.0–46.0)
Hemoglobin: 12.6 g/dL (ref 12.0–15.0)
Sodium: 139 mEq/L (ref 135–145)
TCO2: 24 mmol/L (ref 0–100)

## 2011-03-07 LAB — CK: Total CK: 168 U/L (ref 7–177)

## 2011-03-07 LAB — DIFFERENTIAL
Eosinophils Relative: 1 % (ref 0–5)
Lymphocytes Relative: 33 % (ref 12–46)
Lymphs Abs: 2.1 10*3/uL (ref 0.7–4.0)
Neutrophils Relative %: 59 % (ref 43–77)

## 2011-05-05 NOTE — Assessment & Plan Note (Signed)
HEALTHCARE                            CARDIOLOGY OFFICE NOTE   NAME:[name] [name], [name] [name]                      MRN:          [id number]  DATE:04/18/2008                            DOB:          27-Mar-1941    CHIEF COMPLAINT:  Shortness of breath.   HISTORY OF PRESENT ILLNESS:  [name] [name] comes in for a follow-up visit.  She is somewhat distressed.  Her overall activity level is significantly  reduced.  Her daughter feels as though there has been a substantial  change over the past few months.  In the past, the patient would be  given to talk, and develop a little bit of what sounds like dysphonia;  however, this has progressed over the past couple months to where, when  she exerts herself, she gets profoundly short of breath.  She has never  been a smoker.  This seems to have progressed.  She does not get a whole  lot of chest tightness.  She has had a fair amount of difficulty with  what is regarded to be pretty severe sleep apnea.  She has problems  wearing the mask, and adjustments have been tried to be made to try to  adjust for this.  She has seen Dr. Craige Cotta in followup, and he has done a  very nice job of trying to work with what appears to be fairly severe  sleep apnea.  The patient has had an extensive workup in the past.  She  had an echocardiogram done in October 2007.  At that time her left  ventricular function was normal without regional wall motion  abnormalities, and there was not significant right-sided enlargement.  A  chest x-ray in 2006 revealed minimal bronchitic changes.  More recently,  she has had a chest x-ray in January of this year which revealed  moderate peribronchial thickening suggesting chronic bronchitis and a  small-to-moderate hiatal hernia.  There was no acute cardiopulmonary  disease.  She has had a CAT scan done in the past in 2005, and this was  relatively unremarkable, but this was not a PE protocol.  Again, there  was  no evidence of any significant right-sided cardiac enlargement, or  changes on electrocardiogram.   PAST MEDICAL HISTORY:  1. History of hypertension  2. Hyperlipidemia.  3. Depression.  4. Insomnia.  5. Cholecystectomy.   ALLERGIES:  The patient has allergies to PERCOCET.   MEDICATIONS INCLUDE:  1. Norvasc 2.5 mg daily.  2. Prozac 20 mg daily.  3. Furosemide 40 mg daily.  4. Benicar 10 mg daily.  5. Vitamin C 500 mg daily.  6. Calcium 1000 mg daily.  7. Multivitamin daily.  8. Plavix 75 mg daily.  9. K-Dur 20 mEq daily.  10.Fentanyl patch 50 mg daily.  11.Crestor 10 mg daily.  12.Lunesta 3 mg a night.  13.Welchol 625 b.i.d.  14.Lovaza.  15.Omega 3 b.i.d.   She has not had sputum production, hemoptysis, diarrhea, bloody stools,  or asthma symptoms.   She has had a knee replacement in 1996.  A cardiac catheterization done  in 2002 revealed about  a 30% ostial stenosis in the circumflex, 40% LAD,  and 50% diagonal.  She had 30% and 30%-40% lesions in the right coronary  with otherwise normal left ventricular systolic function.   SOCIAL HISTORY:  The patient is widowed.  She does not smoke.  She has a  fair amount of stress related to a variety of issues.  She has had  previous evaluation with to rule out pheochromocytoma which has been  negative.  She has had previous pulmonary function studies done in 2005  with an FEV-1 of 1.8.  Her diffusion capacity, at that time, was normal.   FAMILY HISTORY:  Father died at 89 of heart failure.  Mother died at 20  had tetanus. She has a brother who has peripheral vascular disease, and  another brother who is in good health.  Sister died at 64 of colon  cancer.  She is 2 other surviving sisters.   PHYSICAL EXAMINATION:  She is alert and oriented, somewhat tearful.  Blood pressure 144/74, pulse is 78.  Lung fields reveal no significant expiratory wheezes presently.  The cardiac rhythm is regular.  ABDOMEN:  Soft.  Peripheral  pulses are intact.  NEUROLOGIC EXAM:  Nonfocal.  EXTREMITIES:  Without significant edema.   ELECTROCARDIOGRAM:  Demonstrates a normal sinus rhythm entirely within  normal limits.   IMPRESSION:  1. Marked exercise intolerance with progressive dyspnea of effort on      exertion.  2. Anxiety.  3. Prior cholecystectomy.  4. Prior knee replacement.  5. Hypertension.  6. Hyperlipidemia.   RECOMMENDATIONS:  I had a lengthy discussion with the patient and her  daughter.  They both feel that cardiac catheterization is necessary to  exclude progressive coronary disease.  We have talked about the various  options and strategies.  Our initial plan would be to go ahead and  proceed with cardiac catheterization.  Depending upon the results of  these findings, we would then consider whether or not to do further  studies.  We will likely obtain a D-dimer at the time of her  presentation, and we may consider a CT scan of chest.  I have also  encouraged the patient and her family to consider going back to Dr. Craige Cotta  to try to work out problems related to her current issues with regard to  the severe sleep apnea.  We may also decide to get another  echocardiogram depending upon the findings.  She is agreeable to this,  and desires to proceed.     [name] [name]. Riley Kill, MD, [facility]  Electronically Signed    TDS/MedQ  DD: 04/19/2008  DT: 04/19/2008  Job #: [id number]

## 2011-05-05 NOTE — Letter (Signed)
August 18, 2007    Ernestina Penna, M.D.  [address] [place], [place] [address]   RE:  Takeda, KALE  MRN:  [id number]  /  DOB:  10-24-1941   Dear Roe Coombs,   I had the pleasure of seeing Ms. Dalesandro in the office today in followup.  Her chief complaint continues to be that of moderate shortness of  breath. She says it continues to get worse. When she tries to have a  conversation she has a great deal of difficultly. She has gone through 3  or 4 masks now and not been able to find something successful for her  sleep apnea.   Today on examination, the blood pressure by me was 130/70 and the pulse  was 64. There was some delay in expiration. There were no rales noted.  The cardiac rhythm was regular.   Her electrocardiogram demonstrated normal sinus rhythm and was  essentially within normal limits.   She has sleep apnea. Dr. Craige Cotta has emphasized weight loss and reduction  and there is potentially a component based on her previous contrast  esophageal motility study of mild distal esophagitis. One wonders  whether she could have reflux at night. She is not on any specific  therapy for that and that might be something that would be worthwhile  considering. With regard to her lung exam, she does have prolonged  expiration. I have taken the liberty of ordering some  pulmonary function studies to see if there is some airway obstruction. I  appreciate the opportunity of sharing in her care. We will see her back  in follow up in a few months and call her regarding her pulmonary  function studies, and I will send them to your office.    Sincerely,      Arturo Morton. Riley Kill, MD, [facility]  Electronically Signed    TDS/MedQ  DD: 08/18/2007  DT: 08/19/2007  Job #: [id number]   CC:    Coralyn Helling, MD

## 2011-05-05 NOTE — Procedures (Signed)
NAME:  Enright, [name] [name]               ACCOUNT NO.:  [id number]   MEDICAL RECORD NO.:  [id number]          PATIENT TYPE:  OUT   LOCATION:  SLEEP CENTER                 FACILITY:  [facility]   PHYSICIAN:  Coralyn Helling, MD        DATE OF BIRTH:  October 21, 1941   DATE OF STUDY:  08/02/2007                            NOCTURNAL POLYSOMNOGRAM   REFERRING PHYSICIAN:   INDICATION FOR STUDY:  This is an individual who had an overnight  polysomnogram on November 03, 2006 that showed severe obstructive sleep  apnea with an apnea-hypopnea index of 68 and an oxygen saturation nadir  of 86%.  She returned to the sleep lab for a CPAP titration study.   EPWORTH SLEEPINESS SCORE:  2.   MEDICATIONS:  Zegerid, Norvasc, Prozac, Lasix, Benicar, Crestor, Plavix,  Lunesta, potassium, hydrocodone, Lovaza, vitamin C, calcium, Welchol,  fentanyl patch.  The patient took a Lunesta at 10 p.m. on the night of  the study.   SLEEP ARCHITECTURE:  The total recording time was 419 minutes.  Total  sleep time was 285 minutes.  Sleep efficiency is 68%, which is reduced.  Sleep latency was 21 minutes.  The study was notable for the lack of  slow wave sleep and REM sleep.  The patient slept in both the supine and  nonsupine positions.   RESPIRATORY DATA:  The average respiratory rate was 10.  The patient was  titrated initially from a CPAP pressure setting of 6 to 21 cm H2O.  She  was changed to BiPAP from 23/21 to 24/21.  She continued to have  persistent respiratory events with an apnea-hypopnea index of 13.3 on a  BiPAP pressure setting of 24/21.  She also had persistent episodes of  snoring.   OXYGEN DATA:  The baseline oxygenation was 98%.  The oxygen saturation  nadir was 87% at a BiPAP pressure setting of 24/21.  Her mean  oxygenation during non-REM sleep was 96%.  Her minimal oxygenation  during non-REM sleep was 91%.   CARDIAC DATA:  The rhythm strip showed normal sinus rhythm with  occasional PVCs.   MOVEMENT-PARASOMNIA:  The periodic limb movement index was 12.   IMPRESSIONS-RECOMMENDATIONS:  This was a suboptimal CPAP titration  study.  It appears that the patient would require increased pressure  levels to alleviate her sleep apnea.  What I would recommend is that she  undergo  an auto-BiPAP titration study for two weeks, then depending upon her  response to this, she will likely need to return to the sleep lab for an  in-lab BiPAP titration study after she has acclimatized to the use of  PAP therapy.      Coralyn Helling, MD  Diplomat, American Board of Sleep Medicine  Electronically Signed     VS/MEDQ  D:  08/11/2007 16:38:05  T:  08/12/2007 17:44:22  Job:  [id number]

## 2011-05-05 NOTE — Letter (Signed)
September 05, 2008    [name] [name], M.D.  [address] [place], [place] [address]   RE:  [name] [name]  MRN:  [id number]  /  DOB:  [birth year]   Dear [name] [name]:   I had the pleasure of seeing [name] [name] in the office today in a  followup visit.  To briefly summarize, she is really quite a lot better.  She stop taking Lovaza, and lost about 8 pounds in fluid weight.  She  thinks that this has made a substantial difference.  She denies any  ongoing chest pain at the present time.  She looks physically better and  stronger.   CURRENT MEDICATIONS:  1. Norvasc 2.5 daily.  2. Prozac 20 mg daily.  3. Furosemide 40 mg daily.  4. Benicar 10 mg daily.  5. Vitamin C.  6. Calcium.  7. Multivitamin.  8. Plavix 75 mg daily.  9. K-Dur 20 mEq daily.  10.Fentanyl patch 50 mcg daily.  11.Crestor 10 mg daily.  12.Lunesta 3 mg nightly.  13.Welchol b.i.d.  14.Nexium.   PHYSICAL EXAMINATION:  GENERAL:  She is alert and oriented.  VITAL SIGNS:  The weight is now 226 pounds, blood pressure 124/69 with  pulse of 65.  LUNGS:  Lung fields are clear.  CARDIAC:  Rhythm is really regular.  I did not appreciate a significant  murmur.   Her electrocardiogram demonstrates normal sinus rhythm, essentially  within normal limits.  The QT interval is 416 milliseconds.   Overall, I am quite pleased with how she is doing.  Her groin pain has  resolved since she had some shots in her back.  Her shortness of breath  is really dramatically better, and I have tried to encourage her to lose  an additional 10 pounds.  I think this would help her substantially.  We  will see her back in followup in 12 months' time.  I appreciate the  opportunity of sharing her care.    Sincerely,      [name] Morton. Riley Kill, MD, [facility]  Electronically Signed    TDS/MedQ  DD: 09/05/2008  DT: 09/06/2008  Job #: [id number]

## 2011-05-05 NOTE — Assessment & Plan Note (Signed)
[facility]                             PULMONARY OFFICE NOTE   NAME:Shrewsberry, SHERISE GEERDES                      MRN:          [id number]  DATE:07/07/2007                            DOB:          02-04-1941    PULMONARY FOLLOWUP VISIT   I saw Ms. Larayah today in followup for her obstructive sleep apnea.  I  had last seen her in December of 2007.  At that time, I had advised that  she be started on CPAP.  She said that she wanted to talk to Dr. Riley Kill  prior to doing this.  She said that she finally has come to the  realization that it would be in her best interest to do more for her  sleep apnea and, as a result, she has come back for further evaluation.  In summary again, she had her overnight polysomnogram on November 03, 2006 and this showed an apnea/hypopnea index of 68 with an oxygen  saturation of 86%.  I have again reviewed the treatment options for  sleep apnea, including CPAP therapy, oral appliance, and surgical  intervention.  She is agreeable to undergo CPAP therapy.  I will,  therefore, make arrangements for her to undergo a CPAP titration study.  In addition, I have emphasized to her the importance of diet, exercise,  and weight reduction, and I had reviewed various techniques she could do  to try and assist with this.   Once I have a chance to review her sleep study, I will initiate her on  CPAP therapy, and then follow up with her in the office approximately 4  weeks after that.     Coralyn Helling, MD  Electronically Signed    VS/MedQ  DD: 07/07/2007  DT: 07/08/2007  Job #: [id number]   cc:   Arturo Morton. Riley Kill, MD, [facility]

## 2011-05-05 NOTE — Assessment & Plan Note (Signed)
[facility]                            CARDIOLOGY OFFICE NOTE   NAME:[name] [name], [name] [name]                      MRN:          [id number]  DATE:05/02/2008                            DOB:          Dec 03, 1941    [name] [name] is in for followup.  Her chief complaint has been for some time  significant shortness of breath.  She has had some fairly significant  exercise limitation.  She says it gets bad enough at times that she can  barely speak.  She has had moderate complaints of this for some time.  We had been working with her closely.  We subsequently decided to bring  her in for a repeat cardiac catheterization.  At catheterization, the  patient had fairly significant calcification of the coronary vessels.  She did not have critical left main disease.  The left anterior  descending had some intermittent diffuse 30% and 40% area of disease.  The circumflex ostium had about a 50% area of stenosis.  The right  coronary artery also is heavily calcified but without significant  luminal reduction.  She also had well-preserved left ventricular  function.  At cath we did do a blood gas, and her PO2, interestingly  enough, was 65.  Her D-dimer was normal.  We went ahead and did a CT  scan.  The CT scan was done at [facility].  The CT scan  demonstrated some mild calcification in the right thyroid lobe.  The  heart size was normal.  There was no significant pericardial effusion.  The study was able to evaluate pulmonary embolism down to the second  order vessels, and there was no significant evidence of PE.  Moreover,  the D-dimer was normal.  We had her come in today for a 2-D  echocardiogram.  Overall ejection fraction was 55% to 65%.  The right  side was not significantly enlarged.  There was trivial AI and MR.  The  peak pulmonary artery systolic pressure was only mildly increased.  Importantly, the right-sided chambers were not significantly enlarged.   We  had previously had her undergo pulmonary function testing.  She has  also had a chest x-ray in January which suggested moderate peribronchial  thickening, possibly with bronchitis.  Her PFTs revealed an FEV1 of  1.81.  Her study was read by Dr. Fannie Knee as mild obstructive airways  disease with response to bronchodilators with normal lung volumes and  mildly reduced diffusion capacity.   We have made an appointment for her to be seen in the pulmonary clinic.   Her current medications include:  1. Norvasc 2.5 mg daily.  2. Prozac 20 mg daily.  3. Furosemide 40 mg daily.  4. Benicar 10 mg daily.  5. Vitamin C daily.  6. Calcium 1000 mg daily.  7. Multivitamin daily.  8. Plavix 75 mg daily.  9. K-Dur 20 mEq daily.  10.Fentanyl patch 50 mcg daily.  11.Crestor 10 mg daily.  12.Lunesta 3 mg nightly.  13.Welchol b.i.d.  14.Lovaza b.i.d.   PHYSICAL EXAMINATION:  On physical, she is alert and oriented  in no  distress.  The blood pressure is 130/72, the pulse is 60.  The lung fields reveal some mild expiratory prolongation without  definite rhonchi, but with a suggestion of slight wheezes.  There are no  inspiratory findings.  Her cardiac rhythm is regular.   [name] [name] has been a perplexing situation.  She does have a slightly low  PO2 by arterial blood gas testing.  She has some coarse bronchitic  changes by chest x-ray, and very mild pulmonary findings suggesting  possibly mild obstruction.  She does not have progression of her  coronary disease, and her right-sided chambers are normal.  While  chronic pulmonary embolism cannot be excluded, the right-sided chambers  are not enlarged.  There is no evidence of acute pulmonary embolism as  the etiology of her symptoms.  The patient does have evidence of sleep  apnea and is on continuous positive airway pressure.   In summary, we will try to have her see the pulmonologists to see if  they have an alternative opinion to explain some  of her shortness of  breath.  I would not pursue further cardiac studies at the present time.  I have spent nearly 30 to 45 minutes going over all of these studies  with [name] [name].  She will return to see [place] in 2 months.     Arturo Morton. Riley Kill, MD, [facility]  Electronically Signed    TDS/MedQ  DD: 05/02/2008  DT: 05/02/2008  Job #: [id number]   cc:   Ernestina Penna, M.D.  Coralyn Helling, MD

## 2011-05-05 NOTE — Cardiovascular Report (Signed)
NAME:  [name] [name], [name] [name]               ACCOUNT NO.:  [id number]   MEDICAL RECORD NO.:  [id number]          [name] TYPE:  INP   LOCATION:  6523                         FACILITY:  [facility]   PHYSICIAN:  Arturo Morton. Riley Kill, MD, FACCDATE OF BIRTH:  04-07-1941   DATE OF PROCEDURE:  04/25/2008  DATE OF DISCHARGE:                            CARDIAC CATHETERIZATION   INDICATIONS:  [name] [name] is a very nice 70 year old female well known to  me.  [name] [name] has a long history of shortness of breath, chest pain, and  hyperlipidemia.  [name] [name] has had prior cardiac catheterization.  [name] [name] has had  increasing shortness of breath as of late, and [name] [name] and [name] [name] family were  extensively concerned that perhaps [name] [name] had progression of coronary  disease.  We have been following [name] [name] fairly closely recently and after a  thorough discussion elected to recommend repeat catheterization.  [name] [name]  and [name] [name] family were pushing to proceed.   PROCEDURES:  1. Left heart catheterization.  2. Selective coronary arteriography.  3. Selective left ventriculography.   DESCRIPTION OF PROCEDURE:  [name] [name] was brought to [name] cath lab,  prepped and draped in [name] usual fashion.  Through an anterior puncture,  [name] femoral artery was easily entered, and 5-French sheath was placed.  Standard left Judkins catheter was utilized to engage [name] left coronary  and Gatha Mayer was used to engage [name] right coronary artery.  Central aortic  and left ventricular pressures were measured with [name] pigtail.  Ventriculography was then performed in [name] RAO projection.  There were  no complications.  [name] [name] was taken to [name] holding area in satisfactory  clinical condition after completion of [name] films.  I showed some of [name]  films to [name] [name] in [name] catheterization laboratory, then reviewed  [name] films with [name] [name] daughters in [name] holding area.  [name] [name] had no major  complications.   HEMODYNAMIC DATA:  1. Central aortic pressure 141/71, mean 93.  2. Left ventricular pressure  139/90.  3. There was no gradient pullback across [name] aortic valve.   ANGIOGRAPHIC DATA:  1. Ventriculography done in [name] RAO projection reveals vigorous global      systolic function without segmental wall motion abnormality.  2. [name] coronaries are generally calcified, similar to [name] previous      study with some perhaps advancement and calcification in [name] LAD.  3. [name] left main is free of critical disease.  4. [name] LAD has some diffuse mild plaquing of approximately 30% in [name]      mid vessel.  [name] first diagonal also has perhaps 30-40% narrowing.      There is some moderate calcification near [name] LAD diagonal      bifurcation.  [name] remainder of [name] vessel has luminal irregularity,      but no significant focal obstruction.  5. [name] circumflex demonstrates an ostial 50% stenosis.  [name] lumen does      not appear to be significantly compromised, and comparing shot per      shot with [name] previous angiographic study does not appear to      demonstrate significant progression.  Mild progression can      certainly not be excluded.  [name] remainder of [name] circumflex system      is without critical narrowing.  6. [name] right coronary artery is a large-caliber vessel with a smaller      bifurcating posterior descending and a larger posterolateral      branch.  [name] mid vessel demonstrates a fairly long segmental area      of plaquing that would measure 30-40% luminal reduction diffusely.   CONCLUSIONS:  1. Well-preserved left ventricular function.  2. Calcification of [name] coronary arteries.  3. Nonobstructive coronary plaque.   DISPOSITION:  We will continue to evaluate [name] [name].  We did do a  blood gas today; [name] pO2 was 65 and pH was normal.  Oxygen saturation is  92%.  D-dimer is being obtained.  We may consider CT scanning of [name]  chest and continued followup with Pulmonary Medicine.  [name] [name] has  severe sleep apnea.      Arturo Morton. Riley Kill, MD, [facility]  Electronically  Signed     TDS/MEDQ  D:  04/25/2008  T:  04/26/2008  Job:  [id number]   cc:   Arturo Morton. Riley Kill, MD, [facility]  Ernestina Penna, M.D.  CV Laboratory

## 2011-05-05 NOTE — Discharge Summary (Signed)
NAME:  [name], [name]               ACCOUNT NO.:  [id number]   MEDICAL RECORD NO.:  [id number]          PATIENT TYPE:  INP   LOCATION:  6523                         FACILITY:  [facility]   PHYSICIAN:  Arturo Morton. Riley Kill, MD, FACCDATE OF BIRTH:  Mar 28, 1941   DATE OF ADMISSION:  04/25/2008  DATE OF DISCHARGE:  04/26/2008                               DISCHARGE SUMMARY   PRIMARY FINAL DISCHARGE DIAGNOSES:  Dyspnea, cardiac catheterization  without critical coronary artery disease and chest CT without  significant abnormality.   SECONDARY DIAGNOSES:  1. Hypertension.  2. Hyperlipidemia.  3. Depression.  4. Insomnia.  5. Obesity.  6. Status post cholecystectomy.  7. Allergy or intolerance to PERCOCET.  8. Status post total knee replacement in 1996.  9. Family history of coronary artery disease and peripheral vascular      disease in her father and brother.   TIME AT DISCHARGE:  43 minutes.   HOSPITAL COURSE:  [name] [name] is a 70 year old female with a history of  nonobstructive coronary artery disease.  She was seen by Dr. Riley Kill in  the office complaining of significant dyspnea on exertion.  She was  admitted for further evaluation and catheterization.   The cardiac catheterization showed diffuse 30%-50% lesions in the  diagonal LAD, circumflex and RCA.  Her EF was normal.  A CT scan was  ordered.  The CT angiogram showed no pulmonary embolus.  She had a 4 mm  calcification in the right thyroid lobe as well as worsened central  trabeculation in the partly visualized L1 that likely represents a  benign hemangioma.  There was no acute cardiopulmonary process.   [name] [name] had an ABG performed, which showed a pH of 7.381, pCO2 of 45.4,  pO2 of 65, and bicarb 26.9.  Her O2 saturation was 92% at that time.  On  the day of discharge, her O2 saturation was 96% on room air and 99%  after ambulation.  She did complain of dyspnea on exertion despite the  normal O2 saturation.  Dr. Riley Kill felt  that no further inpatient workup  was indicated and she could follow up as an outpatient with Cardiology,  Pulmonary, and primary care.   DISCHARGE INSTRUCTIONS:  Her activity level is to be increased  gradually.  She is to call our office for any problems with the cath  site.  She is to stick to a low-sodium heart-healthy diet.  She is to  follow up with Dr. Riley Kill, on May 02, 2008, at 4:00 p.m. and with Rubye Oaks, nurse practitioner for Dr. Craige Cotta, whom she has seen in the past  on May 03, 2008, at 11:30.  She is to get an echocardiogram on May 01, 2008, at 7:30.  She is to follow up with Dr. Christell Constant, at [facility] as needed.   DISCHARGE MEDICATIONS:  1. Nexium 40 mg daily.  2. Vitamin C and calcium with vitamin D as well as Centrum Silver      daily.  3. Hydrocodone 10/325 mg b.i.d.  4. Plavix 75 mg a day.  5. Norvasc  2.5 mg daily.  6. Prozac 20 mg a day.  7. Lasix 40 mg a day.  8. Benicar 10 mg a day.  9. K-Dur 20 mEq a day.  10.Fentanyl patch 50 mcg every three days.  11.Crestor 10 mg a day.  12.Lunesta 3 mg nightly.  13.WelChol 625 mg b.i.d.  14.Lovaza 1 g b.i.d.      Theodore Demark, PA-C      Arturo Morton. Riley Kill, MD, [facility]  Electronically Signed    RB/MEDQ  D:  04/26/2008  T:  04/27/2008  Job:  [id number]   cc:   Rubye Oaks, NP  Ernestina Penna, M.D.

## 2011-05-05 NOTE — Assessment & Plan Note (Signed)
[facility]                            CARDIOLOGY OFFICE NOTE   NAME:Leap, SHAWNICE TILMON                      MRN:          [id number]  DATE:11/28/2007                            DOB:          [birth year]    [name] [name] is in for followup.  In general, she has been stable.  She does  think that the treatment of the sleep apnea has helped.  Unfortunately,  she says that it effects her face if she wears the night before, so if  she is going somewhere the next day, she does not use it.  She does have  sudden episodes of shortness of breath.  These do not awaken her from  her sleep.  She says they occur almost always when she is talking to  someone.  If she continues to talk, she suddenly gets short of breath  and has difficulty continuing her conversation.  The reasons for this do  remain unclear.  The patient had an echocardiogram done in October 2007,  and at that time, her overall left ventricular function was normal with  no regional wall motion abnormalities.  Her last chest x-ray in 2006 had  minimal bronchitic changes.  She also had a CT scan done because of  right paratracheal prominence on x-ray, and this was normal in the past.  She has not, however, had a PE protocol.   MEDICATIONS:  1. Norvasc 2.5 mg daily.  2. Prozac 20 mg daily.  3. Furosemide 40 mg daily.  4. Benicar 10 mg daily.  5. Vitamin C daily.  6. Calcium daily.  7. Multivitamin daily.  8. Plavix 75 mg daily.  9. K-Dur 20 mEq daily.  10.Fentanyl patch daily.  11.Zegerid 40 mg daily.  12.Crestor 10 mg daily.  13.Lunesta 3 mg at bedtime.  14.Welchol daily.  15.Lovaza.   PHYSICAL EXAMINATION:  She is alert and oriented, in no acute distress.  Blood pressure is 116/71, pulse 67.  The lung fields are entirely clear.  The cardiac rhythm is regular.  I do not appreciate a significant  murmur.   The EKG reveals normal sinus rhythm, essentially within normal limits.  There is no  increase in rate.   Overall, [name] remains relatively stable.  She has not had shortness  of breath episodes in general, and they are really quite sporadic.  They  also seem to be related to talking.  Her  echocardiogram has been unremarkable.  I will review her findings, but  at the present time, she appears relatively stable.     Arturo Morton. Riley Kill, MD, [facility]  Electronically Signed    TDS/MedQ  DD: 12/07/2007  DT: 12/07/2007  Job #: [id number]

## 2011-05-08 NOTE — H&P (Signed)
[place]. [facility]  Patient:    [name], [name] DORGAN Visit Number: [id number] MRN: [id number]          Service Type: [facility] Location: [id number] Attending Physician:  Herold Harms Dictated by:   Sammuel Cooper Mahar, P.A. Admit Date:  04/11/2002                           History and Physical  DATE OF BIRTH: 1941/02/11  CHIEF COMPLAINT: Right knee pain.  HISTORY OF PRESENT ILLNESS: The patient is a 70 year old female, with a long history of right knee pain.  She is status post left total knee arthroplasty by Dr. Fannie Knee in 1999 with good results.  Her right knee has failed conservative management.  She did have the right knee scoped back in 1995.  She has failed cortisone injections to the right knee more recently.  It has gotten to the point that it is near constant in nature, interfering with her activities of daily living and quality of life.  Risks and benefits of the proposed surgery were discussed with the patient by Dr. Ollen Gross as well as myself at length.  She indicated understanding and does wish to proceed with the surgery at this point.  ALLERGIES: No known drug allergies.  MEDICATIONS:  1. Norvasc 2.5 mg p.o. q.d.  2. Prilosec 20 mg p.o. q.d.  3. Prozac 20 mg p.o. b.i.d.  4. Lasix 40 mg p.o. q.d.  5. Ditropan XL 10 mg p.o. q.d.  6. Vicodin p.r.n.  7. Vitamin C supplement.  8. Vitamin E supplement.  9. Calcium supplement. 10. Aspirin 325 mg p.o. q.d., which the patient stopped on March 26, 2002 in     preparation for surgery. 11. Bextra 20 mg p.o. q.d.  The patient will discontinue her vitamins and calcium at this point.  PAST MEDICAL HISTORY:  1. Hypertension.  2. Coronary artery disease.  3. Osteoarthritis.  4. Incontinence.  5. Hiatal hernia.  6. Gastroesophageal reflux disease.  7. Anxiety.  PAST SURGICAL HISTORY:  1. Cardiac catheterization in January 2003.  2. Left knee total knee arthroplasty in 1999.  3.  Cholecystectomy in January 2003.  4. Carpel tunnel release in May 2000.  5. Arthroscopic right knee surgery in 1995.  SOCIAL HISTORY: The patient denies tobacco use.  She denies alcohol use.  She is a widow.  She is a retired Comptroller.  She has two grown children.  Her daughter as well as her son and her daughter-in-law will be available to help her through her postoperative course, though they will not be available until after 5 p.m.  She is very interested in [place] inpatient rehab if at all possible.  She did go there after her total knee replacement back in 1999 as well and was very pleased with her service in their rehabilitation program. The patient lives in a two-level home, with her bedroom on the second floor.  FAMILY HISTORY: Significant for osteoarthritis, coronary artery disease, hypertension, diabetes mellitus, and colon cancer.  REVIEW OF SYSTEMS: The patient denies any fever, chills, sweats, or bleeding tendencies.  CNS: Denies blurred vision, double vision, seizures, headaches, paralysis.  CARDIOVASCULAR: Denies chest pain, angina, orthopnea, claudication, or palpitations.  PULMONARY: Denies shortness of breath, productive cough, or hemoptysis.  GI: Denies nausea, vomiting, constipation. Positive for diarrhea off and on since her cholecystectomy.  Denies melena or bloody stool.  GU: Denies dysuria, hematuria, or  discharge.  Positive for incontinence.  PHYSICAL EXAMINATION:  VITAL SIGNS: Blood pressure 162/94, respirations 16 and unlabored, pulse 60 and regular.  GENERAL APPEARANCE: The patient is a 70 year old white female.  She is alert and oriented and in no acute distress.  She is well-nourished, well-developed, appears her stated age.  She is pleasant and cooperative to examination; however, she is very anxious about her upcoming surgery.  HEENT: Head normocephalic, atraumatic.  PERRL.  EOMI.  Nares patent.  Pharynx clear.  NECK: Soft to palpation.  No  lymphadenopathy.  No bruits.  No thyromegaly.  CHEST: Clear to auscultation.  No rales, rhonchi, stridor, wheezes, or friction rub.  BREAST: Not pertinent, not performed.  HEART: S1 and S2.  Regular rate and rhythm.  No murmurs, gallops, or rubs noted on examination today.  ABDOMEN: Positive bowel sounds.  Nontender, nondistended.  No organomegaly noted.  GU: Not pertinent, not performed.  EXTREMITIES: The patient has limited motion of the right knee.  Sensation and motor function are grossly intact.  Neurovascularly intact.  Positive posterior tibialis as well as dorsalis pedis pulses on todays examination.  SKIN: Intact without any lesions or rashes.  LABORATORY DATA: X-rays show severe medial compartment arthritis as well as patellofemoral degenerative changes.  IMPRESSION:  1. Right knee severe osteoarthritis.  2. Coronary artery disease, status post cardiac catheterization in January     2003.  She has two-vessel disease; however, did not require any stent     placement at that point.  3. Hypertension.  4. Urinary incontinence.  5. Hiatal hernia.  6. Gastroesophageal reflux disease.  7. Anxiety.  8. Status post cholecystectomy in January 2003.  PLAN:  1. Admit to [employer] [facility] for right total knee arthroplasty     by Dr. Ollen Gross on Wednesday, April 05, 2002.  2. The patients primary care physician is Dr. Rudi Heap.  She is     contacting him for medical clearance, to have faxed to our office.  This     is pending at this point.  3. The patients cardiologist is Dr. Riley Kill.  She is also contacting his     office to have cardiac clearance forwarded to [place].  Again, this is pending     at this point.Dictated by:   Sammuel Cooper. Mahar, P.A. Attending Physician:  Herold Harms DD:  03/29/02 TD:  03/29/02 Job: [id number]

## 2011-05-08 NOTE — H&P (Signed)
NAME:  Druckenmiller, MAKINZEE DURLEY                         ACCOUNT NO.:  [id number]   MEDICAL RECORD NO.:  [id number]                   PATIENT TYPE:  EMS   LOCATION:  ED                                   FACILITY:  [facility]   PHYSICIAN:  Pricilla Riffle, M.D.                 DATE OF BIRTH:  1941/02/14   DATE OF ADMISSION:  08/28/2004  DATE OF DISCHARGE:                                HISTORY & PHYSICAL   IDENTIFICATION:  Ms. Finklea is a 70 year old woman who presents for evaluation  of shortness of breath.   HISTORY OF PRESENT ILLNESS:  The patient has known coronary artery disease  by cath in 2002, by report has a 40% LAD lesion, 50% diagonal, 50% OM1, 30%  circumflex, 30% RCA, EF was normal, plan for medical therapy.  In talking  with the patient, she was doing fairly well.  She is followed by Dr. Riley Kill  periodically.  She denies chest pain recently, has had it intermittently in  the past but very short lived.  Today, she was in the car where a friend was  driving to [place], when she became acutely short of breath, worse with  talking.  Denied chest pain.  No diaphoresis, no palpitations, no nausea.  Her friend recommended she get evaluated and they drove to the [facility]  ER.   On arrival, blood pressure was 140/86, the patient appeared anxious.  The  symptoms eased off and then recurred during the stay.  The patient notes at  home she hears some wheezing, more often at night when she is lying in bed.  Overall, she is not very active secondary to back and knee problems.   ALLERGIES:  Morphine and Percocet.   MEDICATIONS ON ADMISSION:  Norvasc 2.5 mg daily, Prilosec p.r.n., Prozac 20  daily, Lasix 40 daily, Benicar 10 daily, Ambien 10 daily, Crestor 5 daily,  Plavix, and triglyceride pill started September 07.   PAST MEDICAL HISTORY:  1.  CAD.  2.  DJD.  3.  Obesity.  4.  Chronic pain.  5.  Dyslipidemia, no results.   SOCIAL HISTORY:  The patient lives in [place], has two children,  husband died  of PE at [employer].  Denies tobacco, no ETOH.   FAMILY HISTORY:  Mother died at age 62 with history of an MI, father died in  41, no history of CAD.  One brother died at age 19 with history of an MI.   REVIEW OF SYMPTOMS:  Overall, all systems reviewed, note some GE reflux.  Otherwise, all systems reviewed negative except as noted.   PHYSICAL EXAMINATION:  GENERAL:  She is a 70 year old who is intermittently tearful and  intermittently anxious individual, currently denies chest pain, mild  shortness of breath with talking.  VITAL SIGNS:  Blood pressure 140/86, pulse 66 and regular, respiratory rate  18, temperature 97.6.  HEENT:  Normocephalic, atraumatic, PERRLA.  NECK:  JVP is normal, no bruits.  LUNGS:  Mild bilateral wheezes, no rales.  CARDIAC:  Regular rate and rhythm, S1 and S2, no S3, S4, or murmurs.  ABDOMEN:  Supple, no hepatomegaly.  EXTREMITIES:  Good distal pulses, no lower extremity edema.   LABORATORY DATA:  12 lead EKG shows normal sinus rhythm, no acute ST  changes.  Chest x-ray pending.  Hemoglobin 11.8, hematocrit 35, WBC 8.9.  BUN 27, creatinine 1.2.  CK 599, MB 14.3 with a ratio of 2.4 (normal),  troponin 0.01 and D-Dimer 0.23.   IMPRESSION:  The patient is a 70 year old with a history of CAD who was  doing OK until today when she became short of breath, no chest pain, she  appears very anxious, notes some wheezing, and on exam, she has mild  wheezing bilaterally.  EKG is negative.  Labs show CK up but MB ratio is  normal, troponin negative, D-Dimer negative.  I am not convinced the  patient's shortness of breath is cardiac, question pulmonary, question if  this is exacerbated by reflux (especially with her timing history).   I would recommend admitting, rule out for MI, Lovenox, inhaler, Protonix,  echo, PFTs, question noninvasive evaluation.  If all above negative or not,  the patient needs reconsideration for anxiety Rx.                                                 Pricilla Riffle, M.D.    PVR/MEDQ  D:  08/28/2004  T:  08/28/2004  Job:  [id number]

## 2011-05-08 NOTE — Procedures (Signed)
NAME:  [name] [name], [name] [name] NO.:  [id number]   MEDICAL RECORD NO.:  [id number]          PATIENT TYPE:  OUT   LOCATION:  SLEEP CENTER                 FACILITY:  [facility]   PHYSICIAN:  Barbaraann Share, MD,FCCPDATE OF BIRTH:  04-17-39   DATE OF STUDY:  11/03/2006                              NOCTURNAL POLYSOMNOGRAM   REFERRING PHYSICIAN:  Dr. Shawnie Pons   INDICATION FOR STUDY:  Hypersomnia with sleep apnea. Also persistent  disorder of initiating and maintaining sleep.   EPWORTH SCORE:  0.   SLEEP ARCHITECTURE:  The patient had full sleep time of 359 minutes and  never achieved slow wave sleep or REM. Sleep onset latency was very  prolonged at 88 minutes. Sleep efficiency was decreased at 76%.   RESPIRATORY DATA:  The patient was found to have 235 obstructive hypopneas  with 163 obstructive apneas and 9 central apneas for a respiratory  disturbance index of 68 events per hour. There was moderate to loud snoring  noted throughout. The events were not positional, but the patient spent very  little time in the supine position.   OXYGEN DATA:  There was O2 desaturation as low as 86% with the patient's  obstructive events.   CARDIAC DATA:  Occasional PACs were noted but no clinically significant  arrhythmias.   MOVEMENT/PARASOMNIA:  Small numbers of leg jerks with no significant sleep  disruption.   IMPRESSION/RECOMMENDATION:  Severe obstructive sleep apnea/hypopnea syndrome  with a respiratory disturbance index of 68 events per hour and O2  desaturation as low as 86%. Treatment for this degree of sleep apnea should  focus primarily on weight loss as well as CPAP.      Barbaraann Share, MD,FCCP  Diplomate, American Board of Sleep  Medicine     KMC/MEDQ  D:  11/12/2006 15:12:17  T:  11/12/2006 15:31:04  Job:  [id number]   cc:   Arturo Morton. Riley Kill, MD, [facility]  [address]  [address]  [place]  [place] [address]

## 2011-05-08 NOTE — Assessment & Plan Note (Signed)
[facility]                              CARDIOLOGY OFFICE NOTE   NAME:[name], [name]                      MRN:          [id number]  DATE:09/21/2006                            DOB:          11-28-1941    [name] [name] is in for a followup visit.  In general, she has been stable. The  biggest problem that she has had is that she says she gets acute problems  when she tries to talk.  She is able to walk without any difficulty or  shortness of breath but she gets extremely short of breath when she tries to  talk.  She is not sure why this is.  The patient underwent cardiac  catheterization in December 2002 and, at that time, had a 40% lesion in the  LAD, 50% diagonal, 70 to 80% small marginal, 30 to 40% RCA stenosis.  She  has been on lipid-lowering therapy as well as Plavix.  She is also continued  on Norvasc and has been taking diuretics.  Her last echocardiogram suggested  perhaps mild increase in pulmonary artery pressures and she underwent a CT  of the pulmonary arteries to rule out pulmonary embolism.  There has been an  issue of possible mild esophagitis.   PHYSICAL EXAMINATION:  The blood pressure is 114/64.  The pulse is 59.  The  weight is 215 pounds.  We walked around the hallways for up to about 5 to 6 minutes and then on  repeat examination, the lungs were clear.  There was no definite expiratory  wheezing.  The cardiac rhythm was regular, and there was a systolic ejection murmur  that was 1-2/6.  There was no extremity edema.   EKG reveals normal sinus rhythm.  It is within normal limits with a rate of  60.   The patient has had some difficulty sleeping and this has been a lifelong  pattern; however, it has been more acute recently.  The exact etiology of  this is unclear and I think it would be appropriate for her to consider a  sleep study.  We will refer her to the pulmonary division.  She says that  she snores at times, and she does  have a thick neck.  I think that we will  also get a 2-dimensional echocardiogram given the mild increase in pulmonary  pressures previously.  We will see her back in 6 weeks and go over these  studies.       Arturo Morton. Riley Kill, MD, [facility]     TDS/MedQ  DD:  09/21/2006  DT:  09/22/2006  Job #:  [id number]   cc:   Ernestina Penna, M.D.

## 2011-05-08 NOTE — Assessment & Plan Note (Signed)
[place] [facility]                             PULMONARY OFFICE NOTE   NAME:Regino, [name] [name]                      MRN:          [id number]  DATE:11/23/2006                            DOB:          Sep 07, 1941    HISTORY:  I saw Ms. Loeffelholz today in followup, after she had undergone her  overnight polysomnogram on November 03, 2006.  This showed evidence for  severe obstructive sleep apnea with an apnea/hypopnea index of 68 and an  oxygen saturation Nadir of 86%.   I have reviewed the results of her sleep study with her.  I had also  gone over again the average consequences of untreated sleep apnea,  including the increased risks of hypertension, coronary artery disease,  cerebrovascular disease and diabetes.  I discussed with her the  importance of diet, exercise and weight reduction, as well as the  avoidance of alcohol and sedatives.  Driving precautions were discussed  with her as well.  I had reviewed the various treatment options for  sleep apnea with her, including CPAP therapy, oral appliance and  surgical intervention.   At this time it was my recommendation that she undergo CPAP therapy, and  to initiate this she would need to have a CPAP titration study.  She  would prefer to have a discussion with Dr. Arturo Morton. Stuckey prior to  proceeding with any form of treatment for her sleep apnea.  I had  advised her that this would be okay, but that she would definitely need  to have some form of treatment for her sleep apnea, particularly in  light of her other cardiovascular diseases.   I have tentatively scheduled her for a follow-up appointment with me in  four to six weeks, but have asked her to give me a call prior to this,  once she makes the decision as far as what form of therapy she would  like for her sleep apnea.     Coralyn Helling, MD  Electronically Signed    VS/MedQ  DD: 11/23/2006  DT: 11/24/2006  Job #: [id number]   cc:   Arturo Morton. Riley Kill,  MD, [facility]

## 2011-05-08 NOTE — Op Note (Signed)
[place]. [facility]  Patient:    [name] [name], [name] [name] Visit Number: [id number] MRN: [id number]          Service Type: DSU Location: [id number] Attending Physician:  [name] Dictated by:   Sandria Bales. Ezzard Standing, M.D. Proc. Date: 01/11/02 Admit Date:  01/11/2002   CC:         [name], M.D.  [name], M.D. [facility]  [name], M.D. [facility]   Operative Report  DATE OF BIRTH:  September 09, 1941  PREOPERATIVE DIAGNOSIS:  Symptomatic cholelithiasis.  POSTOPERATIVE DIAGNOSIS:  Chronic cholecystitis with cholelithiasis.  OPERATION PERFORMED:  Laparoscopic cholecystectomy, intraoperative cholangiogram.  SURGEON:  Sandria Bales. Ezzard Standing, M.D.  ASSISTANT:  Rose Phi. Maple Hudson, M.D.  ANESTHESIA:  General endotracheal.  ESTIMATED BLOOD LOSS:  Minimal.  INDICATIONS FOR PROCEDURE:  The patient is a 70 year old white female who was hospitalized in December for possible coronary artery disease.  Her evaluation showed no significant coronary artery disease.  She was also found to have gallstones and mildly elevated liver functions.  These were felt to be probably symptomatic.  She now comes for attempted laparoscopic cholecystectomy.  A discussion of the indications and possible complications were carried out with the patient.  I think she also is requesting a private room which I could not guarantee her while she was in the hospital.  DESCRIPTION OF PROCEDURE:  The patient was placed in supine position, given a general endotracheal anesthetic.  She was given 1 gm of Ancef at the initiation of the procedure, had PAS stockings in place.  Her abdomen was prepped with Betadine solution and sterilely draped.  An infraumbilical incision was made with sharp dissection carried down to the abdominal cavity. A 0 degree 10 mm laparoscope was inserted through a 12 mm Hasson trocar and this was secured with a 0 Vicryl suture.  The abdomen was then  inspected.  The right and left lobes of the liver were unremarkable.  The anterior wall of the stomach was unremarkable.  The remainder of the bowel was covered with omentum.  Three additional trocars were placed.  A 10 mm Ethicon trocar in the subxiphoid location, a 5 mm Ethicon trocar in the right midsubcostal and a 5 mm Ethicon trocar in the right lateral subcostal location.  The gallbladder was grasped and rotated cephalad.  It was noted to have some fatty infiltration around it.  Dissection was carried out along the gallbladder cystic duct junction.  The anterior branch of the cystic artery was divided, a clip placed on the cystic duct and intraoperative cholangiogram was obtained.  The intraoperative cholangiogram was obtained using a cut-off taut catheter inserted into the abdominal wall with a 14 gauge Jelco.  Under direct fluoroscopy, I injected about 8 cc of half strength Hypaque solution showing free flow of contrast down the cystic duct, into the common bile duct, into the duodenum and then refluxing back into the hepatic radicals.  There was no bile leak, no filling defect and this was felt to be a normal intraoperative cholangiogram.  The cystic duct was then triply endoclipped and divided.  The posterior branch of the cystic artery was then doubly endoclipped and divided.  The gallbladder was then sharply and bluntly dissected in the gallbladder bed using primarily hooked Bovie coagulation.  Because I got in to the gallbladder, I did place the gallbladder in a bag and delivered it through the umbilicus.  I delivered the stone to the  patient.  The gallbladder bed was then revisualized.  Bleeding was controlled with Bovie electrocautery.  There was no bile leak, no bleeding after I had irrigated and dried up the liver bed.  I then removed the trocars under direct visualization.  I have a 0 Vicryl suture in the umbilical port and the other ports all had no evidence of  bleeding.  The skinny site was closed with 5-0 Vicryl suture, painted with tincture of benzoin, steri-stripped and sterilely dressed.  The sponge and needle counts were correct at end of Ferrari.  The patient was transferred to the recovery room in good condition. Dictated by:   Sandria Bales. Ezzard Standing, M.D. Attending Physician:  [name] DD:  01/11/02 TD:  01/12/02 Job: [id number]

## 2011-05-08 NOTE — Assessment & Plan Note (Signed)
[place] [facility]                             PULMONARY OFFICE NOTE   NAME:[name], [name] [name]                      MRN:          [id number]  DATE:09/16/2007                            DOB:          29-Sep-1941    I received the auto BiPAP titration download for [name] and it  appeared that at a pressure of 17/13 would be optimal for her with a  significant improvement in her apnea-hypopnea index, although she had  somewhat limited usage of the machine.  I will start her on BiPAP of  17/13 with heated humidification and follow up with her in the office to  assess how well she is doing with this.     Coralyn Helling, MD  Electronically Signed    VS/MedQ  DD: 09/18/2007  DT: 09/18/2007  Job #: [id number]

## 2011-05-08 NOTE — Discharge Summary (Signed)
[name] [name]. [facility]  Patient:    [name], [name] [name] [name] Visit Number: [id number] MRN: [id number]          Service Type: [facility] Location: [id number] Attending Physician:  Herold Harms Dictated by:   Alexzandrew L. Perkins, P.A.-C. Admit Date:  04/11/2002 Discharge Date: 04/15/2002                             Discharge Summary  ADMISSION DIAGNOSIS  1. Right knee severe osteoarthritis.  2. Coronary arterial disease.  3. Status post cardiac catheterization, January 2003.  4. Hypertension.  5. Urinary incontinence.  6. Hiatal hernia.  7. Gastroesophageal reflux disease.  8. Anxiety.  9. Status post cholecystectomy in January 2003.  DISCHARGE DIAGNOSES  1. Osteoarthritis, right knee, status post right total knee replacement     arthroplasty.  2. Coronary arterial disease.  3. Status post cardiac catheterization, January 2003.  4. Hypertension.  5. Urinary incontinence.  6. Hiatal hernia.  7. Gastroesophageal reflux disease.  8. Anxiety.  9. Status post cholecystectomy in January 2003. 10. Mild postoperative hemorrhagic anemia.  DESCRIPTION OF THE PROCEDURE: The patient was taken to the operating room on on April 05, 2002, and underwent a right total knee replacement arthroplasty, surgeon Ollen Gross, M.D., assistant Marcie Bal. Troncale, P.A.C.. Surgery under general anesthesia. Hemovac drain x 1. Tourniquet time 53 minutes at 350 mmHg.  CONSULTS: Rehabilitation services.  BRIEF HISTORY: The patient is a 70 year old female with a long-standing history of right knee pain. She had a previous left total knee done by Dr. Fannie Knee back in 1999 with good results. Her right has failed conservative management. It was scoped back in 1995. She has failed cortisone injections recently. It has reached the point where it has interfered with her daily activities and would like to proceed with surgery.  LABORATORY DATA: CBC on admission hemoglobin 13.8,  hematocrit 39.8, white cell count 9.4, red cell count 4.58. Differential: Neutrophils 55, lymphocytes 36, monocytes 6, eosinophils 2, basos 1.  Postoperative hemoglobin and hematocrit 9.8 and 28.5. Last noted hemoglobin and hematocrit 9.4 and 27.4. PT and PTT were 13.3 and 30, respectively with an INR of 1.0 on admission. Serum protime was followed per Coumadin protocol, last noted PT and INR were 22.5 and 2.5 respectively. Chem panel on admission all within normal limits. Follow up BMET on postoperative day #1 all within normal limits. Urinalysis on admission was negative. Blood group type is O+.  I do not see a chest x-ray or EKG on this chart.  HOSPITAL COURSE: The patient was admitted to [facility] and was taken to the operating room and underwent the above stated procedure without complication. The patient tolerated the procedure well. She was later transferred to the recovery room and then to a lower floor for postoperative care. The patient was placed on PCA analgesia for pain control following surgery, supplemented with p.o. analgesia. She was placed on a Coumadin protocol. Physical therapy and occupational therapy were consulted postoperatively to assist with gait training, ambulation and ADLs. A Hemovac drain which was placed at the time of surgery was pulled on postoperative day #1. A rehabilitation consult was called.  The patient had some difficulty with her pain control the first 24 to 48 hours and medications were adjusted.  She did become quite anxious at times. Her concerns were addressed by the staff and also was given Ativan for anxiety. By postoperative day #2  she was improving a little better. She had been seen by rehabilitation. Rehabilitation services felt she was an appropriate candidate for an inpatient stay. It was decided the patient would be transferred at which time a bed became available.  The PCA and IV analgesics were discontinued on  postoperative day #2. A dressing change was initiated and the incision was healing well. She did proceed with a total knee protocol, utilizing CPM and also gait training ambulation. She was initially slow to progress on physical therapy, only ambulating approximately 3 feet by postoperative day #1. She did progress throughout the hospital course and got up to 100 feet by postoperative day #2 and 150 feet by postoperative day #3 and continued to make strides with physical therapy.  She had some increased pain in her right leg on the evening of postoperative day #4 and to the morning of postoperative day #5. The patient was quite concerned that she had developed a clot. Her INR was therapeutic at 2.5, and although there was low evidence suggesting a clot, Dopplers were ordered. The patient underwent a Doppler study on April 13, 2002, which did show no evidence of DVT, SVT or Bakers cyst. She continued to improve. It was felt that the increased pain was due to swelling.  By April 11, 2002, the following day she was much better.  She had been seen in rounds by Dr. Lequita Halt. Luckily a bed did become available later that  day, and it was decided since the patient was improving she could be transferred to [facility] rehabilitation.  DISCHARGE PLAN: The patient was transferred to the [facility] rehabilitation unit.  DISCHARGE MEDICATIONS  1. Benadryl 25 mg p.o. q.4-6h. p.r.n. itching.  2. Ativan 0.5 to 1 mg p.o. or IM q.6h. p.r.n. agitation.  3. Percocet 1 to 2 p.o. q.4-6h. p.r.n. pain.  4. Robaxin 500 mg p.o. or IV q.6h. p.r.n. spasm.  5. Trinsicon 1 p.o. t.i.d.  6. Laxative of choice.  7. Colace 100 mg p.o. b.i.d.  8. Norvasc 2.5 mg p.o. q.a.m.  9. Prozac 20 mg p.o. b.i.d. 10. Lasix 40 mg p.o. q.a.m. 11. Ditropan XL 10 mg p.o. q.d. 12. Prilosec 20 mg p.o. q.d. 13. Potassium chloride 20 mEq p.o. q.d. 14. Tylenol 1 to 2 p.o. q.4-6h. p.r.n. for mild pain.  DISCHARGE INSTRUCTIONS:  Diet as tolerated. Activity, weightbearing as tolerated on the right lower extremity. Continue gait training and ambulation  while in the [facility] rehabilitation unit, PT and OT for ADLs, gait training and ambulation per total knee protocol.  FOLLOW UP: The patient is to follow up with Dr. Lequita Halt in the office two weeks from the date of surgery or following discharge from the [facility] rehabilitation unit.  CONDITION ON DISCHARGE: Improved.  DISPOSITION: To [facility] rehabilitation. Dictated by:   Alexzandrew L. Perkins, P.A.-C. Attending Physician:  Herold Harms DD:  05/22/02 TD:  05/23/02 Job: [id number]

## 2011-05-08 NOTE — Discharge Summary (Signed)
NAME:  [name] [name], [name] [name]                         ACCOUNT NO.:  [id number]   MEDICAL RECORD NO.:  [id number]                   PATIENT TYPE:  INP   LOCATION:  3708                                 FACILITY:  [facility]   PHYSICIAN:  Arturo Morton. Riley Kill, M.D. [facility]         DATE OF BIRTH:  1941/10/08   DATE OF ADMISSION:  08/28/2004  DATE OF DISCHARGE:  09/02/2004                           DISCHARGE SUMMARY - REFERRING   SUMMARY OF HISTORY:  Ms. Scarola is a 70 year old obese white female who  presented with exertional chest discomfort.  She is supposed to see Dr.  Arturo Morton. Stuckey later this month, however, while driving to [place]  from [place], she developed sudden shortness of breath.  Her friend, who was  driving, recommended to go to the office, thus she drove to [facility], then  they decided to go to [employer].  On arrival to the  emergency room, vital signs were stable and her EKG did not show any acute  changes.  It was noted that the patient had reported a 4-1/2-hour drive to  [place], [place] approximately 4 weeks ago with frequent stops.   PAST MEDICAL HISTORY:  Her history is notable for:  1.  Obesity.  2.  Hypertension.  3.  Hyperlipidemia.  4.  TIA in 2005.  5.  Nonobstructive coronary artery disease, with her last cath being in '02;      she had a 40% LAD, 50% diagonal, 50% small OM-1, 30% ostial circumflex,      30% RCA with an EF of 65%.   She also has a history of:  1.  Chronic pain.  2.  Lumbar DJD.  3.  Bilateral TKR.  4.  Occasional wheezing.   LABORATORY DATA:  Admission CKs and MBs were elevated, however, relatives  indices were within normal limits; they ranged from the 400s to low 500s and  the MBs only 0.5 to 12.2; troponins were negative.  Sodium as 138, potassium  4.6, BUN 26, creatinine 1.3, glucose 135.  Subsequent chemistries were  unremarkable.  Hemoglobin and hematocrit 11.3 and 32.6, normal indices,  platelets 190,000, WBC was  5.0.   Chest x-ray did not show any active disease.   EKGs showed sinus bradycardia, normal sinus rhythm, normal axis, nonspecific  ST-T wave changes.  PFTs were performed and preliminary report shows a  minimal obstructive lung defect, confirmed by the decrease in flow rate at  peak flow and flow at 50% and 75% of a flow volume curve.  It was  recommended that a more detailed pulmonary function study may be useful if  clinically indicated.  FEV-1 was 1.83, 76% of predicted.   HOSPITAL COURSE:  Ms. Christell Constant was admitted to 3700 for further evaluation of  her shortness of breath.  Dr. Pricilla Riffle admitted the patient and noted  mild wheezing on exam, however, she had good O2 saturations.  She was put on  CVA prophylaxis.  Overnight, she did not have any further chest discomfort  or shortness of breath and an echocardiogram was performed.  Dr. Riley Kill  felt that her elevated CK-MBs may be related to Crestor, but he was not  sure.  D-dimer was within normal limits.  Dr. Riley Kill discussed the  possibility of a stress Cardiolite with the patient, however, the patient  did not want to do this; Dr. Riley Kill also felt like she may have had a panic  episode without chest discomfort.  Overnight, she did have some dyspnea and  some chest heaviness associated with palpitations at times.  Guy Franco,  P.A. noted that the patient has a history of increased leg pain while on  Zocor.  Dr. Watseka Bing saw the patient in conjunction with Guy Franco,  who felt that her symptoms were of a noncardiac source; however, with her  upcoming back surgery, Dr. Dietrich Pates felt that we needed to specifically  exclude significant coronary artery disease, thus a cardiac catheterization  was arranged.  The echocardiogram showed an EF of 55% to 65%, left atrial  enlargement.  Wall motion abnormalities were not able to be detected.  Catheterization on September 01, 2004 by Dr. Riley Kill showed a normal EF, 30%  to 40% mid  RCA, 40% to 50% ostial diagonal, 30% to 40% proximal LAD, 40%  ostial circumflex, 50% small OM-1.  Dr. Riley Kill felt that she should  continue medical treatment.  Due to the lateness of the catheterization and  her prolonged bedrest, she remained overnight.  Catheterization site was  intact and Dr. Riley Kill assessed her on September 02, 2004.  Hemoglobin and  hematocrit were 11.9 and 34.2, platelets 205,000, BUN 15 and creatinine 0.9,  potassium 3.5.  He felt that the patient could be discharged home.  Prior to  discharge, she did receive some potassium; apparently, the patient was on  potassium at home, however, this was not included on her list.  Dr. Riley Kill  discharged the patient home and will contact Dr. Christell Constant to discuss her  situation with him.   DISCHARGE DIAGNOSES:  1.  Shortness of breath of uncertain etiology.  2.  Nonobstructive coronary artery disease.  3.  Anxiety.  4.  History as previously.   DISPOSITION:  She is discharged home on the following medications:  1.  Norvasc 2.5 mg daily.  2.  Prozac 20 mg daily.  3.  Lasix 40 mg daily.  4.  Prilosec as needed.  5.  Plavix 75 mg daily.  6.  Ambien 10 mg nightly.  7.  Nitroglycerin 0.4 mg p.r.n.  8.  Benicar 10 mg daily.  9.  K-Dur 10 mEq daily.   SPECIAL DISCHARGE INSTRUCTIONS:  She was asked to bring all medicines to all  appointments.  Dr.  Riley Kill instructed her not to take her Crestor.   ACTIVITY:  She was advised no lifting, driving, sexual activity or heavy  exertion for 2 days.   DIET:  Maintain low-salt/-fat/-cholesterol diet.   WOUND CARE:  If she has any problems with the catheterization site, she was  asked to call.   FOLLOWUP:  She will see Dr. Riley Kill on her already previously scheduled  appointment on September 10, 2004 at 11:30 a.m.  She was asked to arrange a  followup appointment with Dr. Christell Constant in regards to her elevated CK-MBs, which Dr.  Riley Kill may feel to be chronic.  She was asked to call Dr.  Christell Constant to  discuss the continuation of her  triglyceride medication of which she does  not know the name.  At the time of followup with Dr. Riley Kill, Dr.  Riley Kill will review all information and determine if she is cleared from a  cardiovascular standpoint for her upcoming back surgery.  Again, the patient  was asked to bring all medicines to all appointments.  Dr. Christell Constant will  arrange referral to pulmonary clinic for further evaluation of shortness of  breath and wheezing.      Joellyn Rued, P.A. [facility]                    Thomas D. Riley Kill, M.D. [facility]    EW/MEDQ  D:  09/02/2004  T:  09/02/2004  Job:  [id number]   cc:   Ernestina Penna, M.D.  [address] [place]  [place] [address]  Fax: [phone]   Arturo Morton. Riley Kill, M.D. [facility]

## 2011-05-08 NOTE — Assessment & Plan Note (Signed)
[facility]                            CARDIOLOGY OFFICE NOTE   NAME:Partin, AUDRE CENCI                      MRN:          [id number]  DATE:12/06/2006                            DOB:          10-Oct-1941    Ms. Hartje is in for followup.  In general, she has done reasonably well.  She does get a little bit of shortness of breath.  She underwent sleep  study.  She has also seen Dr. Craige Cotta.  He thinks that she has severe sleep  apnea.  He has recommended CPAP.  She, however, does not want to do this  and she wanted to see me prior to initiating that potential therapy.  She denies any ongoing chest pain.   On her exam today, the blood pressure is 114/61, the pulse is 70.  The  lung fields are clear.  The cardiac rhythm is regular without a  significant murmur.   The patient has significant sleep apnea.  I have strongly encouraged her  to go on a program that would require CPAP.  She, from a cardiac  standpoint, has been stable on her medications.  She follows up with Dr.  Christell Constant.  I would recommend continued medical therapy from her coronary  standpoint, and I would strongly recommend that she consider going on to  the CPAP program.  She promised me that she would make an appointment  with Dr. Craige Cotta.     Arturo Morton. Riley Kill, MD, [facility]  Electronically Signed    TDS/MedQ  DD: 12/06/2006  DT: 12/06/2006  Job #: [id number]

## 2011-05-08 NOTE — Discharge Summary (Signed)
[place]. [facility]  Patient:    Limehouse, [name] [name] Visit Number: [id number] MRN: [id number]          Service Type: [facility] Location: [phone] Attending Physician:  Herold Harms Dictated by:   Lucita Lora, M.D. Admit Date:  04/11/2002 Discharge Date: 04/15/2002   CC:         Trudee Grip, M.D.  [name], M.D.   Discharge Summary  DISCHARGE DIAGNOSES: 1. Status post right total knee arthroplasty secondary to osteoarthritis. 2. Anemia. 3. Urinary tract infection.  HISTORY OF PRESENT ILLNESS:  The patient is a 70 year old white female with past medical history of left total knee arthroplasty and coronary artery disease and anxiety, who underwent a right total knee arthroplasty secondary to OA on April 05, 2002, by Trudee Grip, M.D.  The patient is on Coumadin for DVT prophylaxis.  Hospital course significant for anxiety and pain intolerance. PT report at this time indicated the patient is ambulating 150 feet with rolling walker at supervision level, could transfer sit to stand at supervision level, and is weightbearing as tolerated.  The patient was transferred to inpatient rehabilitation department.  PAST MEDICAL HISTORY:  Significant for coronary artery disease, hypertension, GERD, anxiety, hiatal hernia, urinary incontinence, and diverticulitis.  PRIMARY CARE Kinzi Frediani:  Dr. Christell Constant.  She sees Dr. Riley Kill at [facility].  ALLERGIES:  No known drug allergies.  FAMILY HISTORY:  Noncontributory.  SOCIAL HISTORY:  The patient lives alone [place]-level [place] 17 steps to entry.  Her daughter will assist her after discharge in the evenings.  She was independent prior to admission.  She is a widow and is on disability.  REVIEW OF SYSTEMS:  Denies any chest pain, shortness of breath, or nausea and vomiting.  HOSPITAL COURSE:  [name] was admitted to [facility] Department  on April 11, 2002, for comprehensive inpatient rehabilitation and received more than three hours of PT and OT therapy daily.  Overall [name] did fairly well during her four-day stay in rehabilitation and she was ambulating at supervision level at the time of discharge.  She remained on Coumadin for DVT prophylaxis during her entire stay.  Her hospital course was significant for a urinary tract infection, anxiety, and muscle spasms.  The patient received Ativan q.h.s. p.r.n. as needed for anxiety and she was started on Bactrim DS one tablet p.o. b.i.d. on April 24, for Escherichia coli UTI.  Pharmacy recommended discontinuing Bactrim and switching to Tequin for urinary tract infection since the patient is on Coumadin and therefore Bactrim was discontinued and the patient was started on Tequin on April 13, 2002.  She received Robaxin p.r.n. as needed for muscle spasm, after addition of this medication, her spasms did improve.  She also was started on Oxycontin CR p.o. q.12h. on April 12, 2002, due to increasing pain.  At the time of admission the patient was placed on K-Dur 20 mEq p.o. q.d. for hypokalemia secondary to Lasix.  Potassium stabilized.  No other major complications occurred while in rehabilitation.  Latest labs indicated that her hemoglobin was 9.4, hematocrit 27.3, white blood cell count 7.2, and platelet count 257.  Sodium 142, potassium 4.3, chloride 107, CO2 27, glucose 118, BUN 7, creatinine 0.7.  AST 25, ALT 25. The patients urine culture was performed on April 11, 2002, which demonstrated greater than 100,000 colonies of Escherichia coli.  At the time of discharge, blood pressure was 144/55, respiratory  rate 20, pulse 75, temperature 98.4. The surgical incision demonstrated no signs of infection.  PT report indicated that the patient was ambulating approximately 150 feet with standard walker at close supervision, could transfer sit to stand with close supervision,  could stand pivot close supervision.  She could perform most ADLs with minimal assist to independent level for setup.  The patient had approximately 60 degree flexion in her right knee.  She was discharged home with her family.  DISCHARGE MEDICATIONS: 1. Trinsicon one tablet twice daily for one month. 2. Coumadin 2 mg daily on April 27 through May 14. 3. Oxycontin standard release 10 mg every 12 hours for one week and then    taper off. 4. Oxycodone IR 5 to 10 mg every six hours as needed for pain. 5. Tequin 400 mg one tablet daily for four more days for urinary tract    infection. 6. Robaxin 5 mg tablet every six hours as needed for spasm.  The patient may resume aspirin when complete.  DIET:  Regular.  WOUND CARE:  Keep wound clean and dry.  ACTIVITY:  No driving until follow-up with Dr. Despina Hick.  FOLLOW-UP:  She can follow up with Dr. Thomasena Edis as needed.  Follow up with Dr. Despina Hick within two weeks, call for appointment.  She will have PT and OT from [facility] and they will monitor Coumadin and INR, first draw will be April 17, 2002. Dictated by:   Lucita Lora, M.D. Attending Physician:  Herold Harms DD:  06/07/02 TD:  06/08/02 Job: [id number]

## 2011-05-08 NOTE — Discharge Summary (Signed)
[place]. [facility]  Patient:    [name] [name], [name] [name] Visit Number: [id number] MRN: [id number]          Service Type: MED Location: [phone] Attending Physician:  Sanjuana Letters Dictated by:   Mont Dutton, M.D. Admit Date:  12/06/2001 Discharge Date: 12/10/2001   CC:         Rudi Heap, M.D., [facility] D. Riley Kill, M.D. [facility]   Discharge Summary  DATE OF BIRTH:  Apr 28, 1941  ADMITTING DIAGNOSES: 1. Abdominal pain, rule out myocardial infarction. 2. Urinary tract infection. 3. Hypertension. 4. Insomnia. 5. Depression. 6. Arthritis.  DISCHARGE DIAGNOSES: 1. Urinary tract infection. 2. Hypertension. 3. Hyperlipidemia. 4. Depression. 5. Arthritis.  CONSULTS:  [facility], Drs. Rothbart and Stuckey.  PROCEDURES:  On 12/09/01, the patient had cardiac catheterization by Dr. Riley Kill.  Findings include 30% narrowing in four areas of vessel, 40% narrowing in one area vessel, 50% narrowing in one area vessel, and 70-80% narrowing of other vessel.  Report to follow.  ADMISSION HISTORY AND PHYSICAL:  The patient is a 70 year old female with history of hypertension, hypercholesterolemia, and postmenopausal, presented with abdominal pain, felt that she was going to explode.  No radiation into jaw, did have some left shoulder discomfort but no lightheadedness, no numbness, tingling, nausea or vomiting.  Did complain of some shortness of breath and diaphoresis.  Pain continued into the ER.  No similar pain in past. The patient recently started on a new cholesterol medication.  Upon discussing with primary M.D., it was noted to be Zetia within the past approximately 2-3 weeks.  The patient also has history of heartburn, takes Prilosec.  Pain was relieved immediately with sublingual nitroglycerin.  The patient was admitted for rule out MI.  For remaining details of admission history and  physical, please consult the dictated history and physical.  HOSPITAL COURSE: #1 - ABDOMINAL PAIN:  The patients CK slightly elevated at 339, then 253, then 279 with MB fractions of 6.8, 5.4, and 4.8.  However, index was 2.0, 2.1, and 1.7 and troponins were .0, 1.01.  EKG showed no changes, no other acute findings.  The patient was noted to have a urinalysis suggestive of infection. Admission UA:  Positive nitrites, small leukocyte esterase, many bacteria, 7-10 white blood cells, 0-2 red blood cells.  On culture, was noted to be Proteus mirabilis appearing 100,000.  The patient treated with Bactrim for 3 days for this urinary tract infection.  The patient did rule out for MI; however, consulted cardiology, as the patient has a history of hypertension, hypercholesterolemia and postmenopausal placing her at higher risk for coronary artery disease.  Cardiology saw patient and recommended Cardiolite versus cardiac cath.  The patient had ultrasound to rule out cholecystitis. It was noted to have cholelithiasis but no findings of acute cholecystitis. Cardiology recommended possibly doing cath if patient did have a cholecystitis in the future, would need cardiac clearance.  Cardiac catheterization performed on 12/10/01.  No stents were placed.  Some mild narrowing noted in multiple areas with one area 70-80%.  Recommendations per cardiology were to check a D-dimer, treat the UTI, questionable spiral CT in that the patient would need to be placed on statin.  D-dimer was .35 which is within normal limits.  As patient was at high risk for pulmonary embolus and did not otherwise suspect it and was normal D-dimer, a CT scan was not performed.  The patients abdominal pain had resolved  throughout hospitalization.  As noted, this abdominal pain most likely is secondary to GI source, as when discussed with her primary physician, she did have a history of esophageal stricture, gastritis, and duodenitis as  well as reflux esophagitis which she had taken Prilosec for.  #2 - URINARY TRACT INFECTION:  Proteus mirabilis sensitive to Bactrim, 3 days of Bactrim treatment in hospital.  No fever or sequelae.  #3 - HYPERTENSION:  The patient was started on low-dose beta blocker in addition to her calcium channel blocker when in hospital.  However, patient on 12/10/01, morning prior to discharge, noted to have on rhythm strip bradycardia.  She was asymptomatic during that time.  Decision made to discharge patient home on calcium channel blocker regimen only.  #4 - HYPERCHOLESTEROLEMIA:  While in hospital, the patients lipid profile obtained.  Total cholesterol 218, triglycerides 206, HDL 45, LDL 132.  These numbers were improved from the reported values from primary M.D.; however still recommended to be placed on statin per cardiology.  Will defer this decision to primary M.D., as patient with history of trial of every statin per primary M.D. and now on Zetia.  Will allow primary M.D. to weight benefits and risks of various side effects the patient experienced on these statin medications against the risk of coronary artery disease.  Also, will hold on Zetia at this point in time as possibility of abdominal/chest pain resulting from the Zetia causing spasm.  The patient was discharged to home on 12/10/01.  DISCHARGE MEDICATIONS:  Discharged on her home medication regimen including her calcium channel blocker. 1. Norvasc 2.5 mg p.o. q.d. 2. Aspirin 325 mg 1 tab p.o. q.d.  ACTIVITY:  No restrictions.  DIET:  Low sodium diet.  WOUND CARE:  Not applicable.  The patient was to discuss with her doctor a cholesterol medication, as the cardiologist recommended her to be on one.  FOLLOW-UP:  The patient is to call Dr. Christell Constant, per primary M.D.s office in the next week for an appointment in 1-2 weeks for follow-up for hospital visit. She is also to call [facility], Dr. Riley Kill, [phone], for a  follow-up  appointment in the next 2-3 weeks. Dictated by:   Mont Dutton, M.D. Attending Physician:  Sanjuana Letters DD:  12/10/01 TD:  12/12/01 Job: [id number]

## 2011-05-08 NOTE — Assessment & Plan Note (Signed)
[facility]                             PULMONARY OFFICE NOTE   NAME:[name], [name] BELKO                      MRN:          [id number]  DATE:10/31/2007                            DOB:          September 10, 1941    SUBJECTIVE:  I saw [name] [name] today in followup for her severe sleep apnea  as well as sleep onset insomnia.   She is currently on BiPAP at 17/13 and she appears to be doing  reasonably well with this.  She is using a nasal mask with a heated  humidifier.  She says she always has to sleep on her right side.  As a  result, she feels that the mask is pressuring on the left side of her  face, but this is not too much of a problem.  She is still having quite  a bit of difficulty as far as maintaining a desirable sleep pattern.  She says she will usually go to bed at around 2 a.m.  She takes her  Lunesta at around 1 a.m.  She says sometimes it can take her quite a bit  of time to fall asleep, during which time she will watch TV or read.  She says she has a lot of thoughts on her mind and feels like she has  lots of racing thoughts.  She also is complaining of feeling depressed,  particularly with regards to her inability to do activities she was able  to do before, due to her arthritis; however, once she does fall asleep,  she will be able to sleep through until about 10 a.m. to 12 noon.  She  says that she would prefer, however, to be able to go to bed earlier and  wake up at a more reasonable time in the morning.   She also complains of having dryness in her mouth since she has been  started on BiPAP.   CURRENT MEDICATIONS:  The list was reviewed.   IMPRESSION/PLAN:  1. Severe obstructive sleep apnea:  I believe she is doing reasonably      well with her current BiPAP settings of 17/13.  I will continue her      on this.  I discussed with her the proper fitting of her mask.  I      have also advised her to try using a chin strap, to see if this  improves her mouth dryness.  If she is still having difficulty      after this, we may need to have her try an alternative mask.  In      addition, I have encouraged her to try to maintain a      diet/exercise/weight reduction program.  2. Sleep onset insomnia:  I discussed with her various techniques with      regards to improving her sleep hygiene, as well as stimulus      control, sleep restriction and relaxation techniques.  I am      concerned that a good portion of these difficulties may also be      related to underlying depression and anxiety,  and I have advised      her to follow up with her primary care physician for the assessment      of this.  3. Delayed sleep phase:  I discussed with her various techniques to      try to improve this.   FOLLOWUP:  I will follow up with her in approximately four months.     Coralyn Helling, MD  Electronically Signed    VS/MedQ  DD: 11/01/2007  DT: 11/01/2007  Job #: [id number]   cc:   Ernestina Penna, M.D.  Arturo Morton. Riley Kill, MD, [facility]

## 2011-05-08 NOTE — Assessment & Plan Note (Signed)
[facility]                               PULMONARY OFFICE NOTE   NAME:[name] [name], [name] [name]                      MRN:          [id number]  DATE:10/06/2006                            DOB:          September 03, 1941    I saw [name] [name] today for evaluation of her possible sleep difficulties.  She  says that she has always had difficulties with her sleep since she has been  an adult.  She has tried several different medications to help with her  sleep, including amitriptyline and Ambien.  She is currently on Lunesta in  addition to taking pain medications for her chronic back pain.  Her current  sleep pattern is that she will go to bed between 11 and 1 o'clock in the  morning.  She will take a pain pill as well as her Lunesta approximately 1  to 2 hours before going to bed.  She says that she must read for a good  while before she is able to get sleepy enough to go to sleep.  However, once  she is asleep, she wakes up only once to use the bathroom, but then goes  back to sleep, and does not get up out of bed until about 10 or 11 o'clock  in the morning.  She says that, in spite of sleeping for this amount of time  she will sometimes still feel tired initially in the morning, but that, if  she gets up and moves around then the tired feeling goes away.  She has been  told that she snores at night and she will often times wake up with a  choking feeling or feels like something is stuck in her throat.  She also  complains of a cough occasionally at night.  She has been told that she  talks in her sleep, as well as grinds her teeth when she is asleep, and she  tends to breathe more through her mouth.  She does also have very vivid  dreams.  She is not currently using anything to help her stay awake during  the day.  Her effort score today is zero.  She denies any symptoms of  restless leg syndrome.  She denies sleep hallucinations, sleep paralysis, or  cataplexy with no  history of sleep walking.   PAST MEDICAL HISTORY:  Otherwise, significant for gastroesophageal reflux  disease.  Hypertension.  Hyperlipidemia.  Coronary artery disease.  Borderline diabetes.  Obesity.  Vocal cord dysfunction.  Colon polyps.  Carpal tunnel syndrome.  She is status post cholecystectomy.  She says she  has valvular heart disease as well as a hiatal hernia.  She has had a  stroke.   CURRENT MEDICATIONS:  1. Norvasc 45 mg daily.  2. Prozac 20 mg daily.  3. Lasix 40 mg daily.  4. Benicar 10 mg daily.  5. Vitamin C 500 mg daily.  6. Calcium 1000 mg daily.  7. Multivitamin once daily.  8. Plavix 75 mg daily.  9. K-Dur 20 mEq daily.  10.Fentanyl patch 50 mcg q.72 hours.  11.Omacor 1 mg b.i.d.  12.Zegerid 40 mg daily.  13.Crestor 10 mg daily.  14.Lunesta 3 mg nightly.  15.Welchol 625 mg b.i.d.  16.Hydrocodone APAP p.r.n.   ALLERGIES:  PERCOCET.   FAMILY HISTORY:  Significant for a sister and brother who have emphysema.  Her parents also have heart disease, and she has a son who has sleep apnea.   SOCIAL HISTORY:  She denies a history of smoking or alcohol use.  She is  widowed.  She is a retired Catering manager.  She is currently on  disability because of knee pains.   REVIEW OF SYSTEMS:  Essentially negative, except for that stated above.   PHYSICAL EXAM:  She is 5 feet 6 inches tall, 214 pounds, temperature 98.6,  blood pressure 110/68.  Heart rate is 62, oxygen saturation 98% on room air.  HEENT:  Pupils are reactive.  There is no sinus tenderness.  No nasal  discharge.  She has Mallampati IV airway with an enlarged tongue.  There is  no lymphadenopathy.  HEART:  S1 and S2 regular rate and rhythm.  CHEST:  Clear to auscultation.  ABDOMEN:  Obese, soft, and nontender.  Positive bowel sounds.  EXTREMITIES:  There is no edema, cyanosis, or clubbing.  NEUROLOGIC:  There are no focal deficits appreciated.   IMPRESSION:  1. She certainly has symptoms as well  as clinical findings, which would be      suggestive of obstructive sleep disordered breathing.  Given the fact      that she has had a history of hypertension, heart disease, and      cerebrovascular disease, I do feel that it would be warranted for her      to undergo further evaluation with an overnight polysomnogram.  She      apparently has already been scheduled for this in the middle part of      November.  I will check with the sleep lab to make sure that she is on      the schedule.  Then I would plan on following up with her shortly after      this to review the results of her sleep study and then make further      recommendations with regards to this.  2. Sleep onset insomnia.  I would not make any adjustments in her medical      regimen at this time, but I would review her sleep study and then,      after her other sleep issues are optimized, then I would address this      issue as well.       Coralyn Helling, MD     VS/MedQ  DD:  10/06/2006  DT:  10/07/2006  Job #:  [id number]   cc:   Arturo Morton. Riley Kill, MD, [facility]

## 2011-05-08 NOTE — Op Note (Signed)
[place]. [facility]  Patient:    [name] [name], [name] [name] Visit Number: [id number] MRN: [id number]          Service Type: [facility] Location: [phone] Attending Physician:  Herold Harms Dictated by:   Ollen Gross, M.D. Proc. Date: 04/05/02 Admit Date:  04/11/2002 Discharge Date: 04/15/2002                             Operative Report  PREOPERATIVE DIAGNOSIS:  Osteoarthritis, right knee.  POSTOPERATIVE DIAGNOSIS:  Osteoarthritis, right knee.  PROCEDURE:  Right total knee arthroplasty.  SURGEON: Ollen Gross, M.D.  ASSISTANT: Marcie Bal. Troncale, P.A.C.  ANESTHESIA:  General.  ESTIMATED BLOOD LOSS:  Minimal.  DRAINS:  Hemovac x1.  TOURNIQUET TIME:  53 minutes at 350 mmHg.  COMPLICATIONS:  None.  CONDITION:  Stable to recovery.  BRIEF CLINICAL NOTE:  Ms. Brauner is a 70 year old female with significant osteoarthritis of the right knee, with pain refractory to nonoperative management.  She presents now for right total knee arthroplasty.  DESCRIPTION OF PROCEDURE:  After successful administration of general anesthetic, a tourniquet was placed high on the right thigh and the right lower extremity prepped and draped in the usual sterile fashion.  The extremity was wrapped in an Esmarch, the knee flexed, and a tourniquet inflated to 350 mmHg.  Standard midline incision was made with a 10 blade through the subcutaneous tissue to the level of the extensor mechanism.  A fresh blade was used to make a medial parapatellar arthrotomy and soft tissue over the proximal medial tibia subperiosteally elevated to the joint line with a knife and to the semimembranosus bursa with a curved osteotome.  Soft tissue over the proximal lateral tibia was also elevated with attention being paid to avoid the patellar tendon or tibial tubercle.  Lateral patellofemoral ligament is cut, patella everted, and the knee flexed to 90 degrees and the ACL and PCL removed.  A  drill is used to create a starting hole in the distal femur, and then the canal is irrigated and a 5 degree right valgus alignment guide placed. Referencing on the posterior condyles, rotation is marked and a block pinned to remove 10 mm off the distal femur.  Distal femoral resection is made with an oscillating saw.  The sizing block is placed, and a size 3 is the most appropriate.  Rotation is marked with the epicondylar axis.  The AP block is pinned and anterior-posterior cuts made.  Tibia subluxed forward and menisci removed.  Extramedullary tibial alignment guide is placed referencing proximally at the medial third of the tibial tubercle distally along the second metatarsal axis and tibial crest.  The block is pinned to remove 10 mm off the nondeficient lateral side.  Tibial resection is made with an oscillating saw.  The size 3 is the most appropriate tibia.  The base plate is placed on the bone with the area of most coverage, and then a block is pinned and proximal tibia prepared with the modular drill and keel punch for the mobile bearing and tibial tray.  The chamfer block is then placed on the femur and the intercondylar and chamfer cuts made.  Size 3 posterior-stabilized femoral trial with a size 3 mobile bearing tibial tray and a 10 mm PS rotating platform inserter placed. Just slight bit of varus-valgus laxity; thus, a 12.5 mm insert was placed, and she achieved full extension with excellent varus-valgus balance down past  100 degrees of flexion.  The patella was then everted, thickness measured to be 23 mm, free-hand resection taken down to 12 mm, and then the 38 template placed.  Lug holes are drilled and trial patella placed.  The patella tracks normally.  The osteophytes are subsequently removed off the posterior aspect of the femur with the trial in place.  All trials are removed and the cut bone surfaces prepared with pulsatile lavage.  The cement is mixed and once  ready for implantation, a size 3 mobile bearing tibial tray, size 3 posterior-stabilized femur, and 38 patella are cemented in place.  The patella is held with a clamp.  Trial 12.5 insert is placed, the knee held in full extension, all extruded cement removed.  Once the cement is fully hardened, then the permanent 12.5 size 3 rotating platform insert is placed into the tibial tray.  The knee is reduced and there is outstanding varus and valgus balance throughout her full range of motion.  The wound is copiously irrigated with antibiotic solution and the extensor mechanism closed over a Hemovac drain with interrupted #1 PDS.  Flexion against gravity is approximately 120 degrees.  The tourniquet was released with a total time of 53 minutes. The subcu was closed with interrupted 2-0 Vicryl, subcuticular running 4-0 Monocryl.  The incision was cleaned and dried and Steri-Strips and a bulky sterile dressing applied.  She was placed into a knee immobilizer, awakened, and transported to recovery in stable condition. Dictated by:   Ollen Gross, M.D. Attending Physician:  Herold Harms DD:  04/05/02 TD:  04/05/02 Job: [id number]

## 2011-05-08 NOTE — Cardiovascular Report (Signed)
[place]. [facility]  Patient:    [name], [name] PEERY Visit Number: [id number] MRN: [id number]          Service Type: MED Location: [id number] Attending Physician:  Sanjuana Letters Dictated by:   Arturo Morton Riley Kill, M.D. [facility] Proc. Date: 12/09/01 Admit Date:  12/06/2001 Discharge Date: 12/10/2001   CC:         William A. Leveda Anna, M.D.  Dietrich Pates, M.D. [facility]  CV Laboratory   Cardiac Catheterization  INDICATIONS:  Ms. Boberg is a 70 year old white female who presents with chest pain.  She had borderline CK-MB positivity.  However, the troponins were negative.  The current study is done to assess coronary anatomy.  PROCEDURE: 1. Left heart catheterization. 2. Selective coronary arteriography. 3. Selective left ventriculography.  CARDIOLOGIST:  Arturo Morton. Riley Kill, M.D. [facility]  DESCRIPTION OF THE PROCEDURE:  The procedure was performed from the right femoral artery using 6-French catheter.  She tolerated the procedure well, and there were no complications.  She was taken to the holding area in satisfactory clinical condition.  HEMODYNAMIC DATA: 1. The central aortic pressure was 145/65. 2. LV pressure 149/17. 3. No aortic-to-left ventricular gradient.  ANGIOGRAPHIC DATA: 1. The left main coronary artery was free of critical disease. 2. The left anterior descending artery coursed to the apex.  There was    mild coronary calcification.  There was also about 40% narrowing in    the LAD beyond the takeoff of the large diagonal.  The diagonal itself    had about 50% ostial narrowing.  Neither of these appeared to be really    high grade.  The second diagonal was free of critical disease. 3. The circumflex consisted of a tiny first marginal with about 50%    narrowing.  The second marginal had mild luminal irregularity but no    critical disease.  There were three additional marginals, all of which    were small and without critical narrowing.  There  was also about 30%    narrowing at the ostium of the circumflex 4. The right coronary artery was a fairly heavily calcified vessel.  There    was about 20 to 30% narrowing in the mid vessel followed by a 30% area    of narrowing in the mid vessel as well.  The distal vessel consisted of    a tiny posterior descending branch and then a large posterolateral    branch.  VENTRICULOGRAPHY:  Ventriculography in the RAO projected revealed preserved global systolic function.  Ejection fraction was calculated at 65%.  CONCLUSIONS: 1. Preserved left ventricular function. 2. Scattered coronary abnormalities with diffuse coronary calcification    and scattered focal irregularities without high-grade areas of narrowing.  DISPOSITION:  Aggressive medical therapy would be recommended.  If not previously present, one would consider adding a "statin" to her regimen.  We would also get a D-dimer Dictated by:   Arturo Morton. Riley Kill, M.D. [facility] Attending Physician:  Sanjuana Letters DD:  12/09/01 TD:  12/10/01 Job: [id number]

## 2011-05-08 NOTE — Cardiovascular Report (Signed)
NAME:  [name] [name], [name] [name]                         ACCOUNT NO.:  [id number]   MEDICAL RECORD NO.:  [id number]                   PATIENT TYPE:  INP   LOCATION:  3708                                 FACILITY:  [facility]   PHYSICIAN:  Arturo Morton. Riley Kill, M.D. [facility]         DATE OF BIRTH:  1941-10-31   DATE OF PROCEDURE:  09/01/2004  DATE OF DISCHARGE:                              CARDIAC CATHETERIZATION   INDICATIONS:  The patient is a 70 year old female well known to me.  She  presents with some recurrent sudden onset of shortness of breath.  D-dimer  was not significantly elevated.  Pulmonary function tests have not been  clearly revealing.  Current study was done to assess coronary anatomy.  She  has known disease.   PROCEDURES:  1.  Left heart catheterization.  2.  Selective coronary arteriography.  3.  Selective left ventriculography.   DESCRIPTION OF PROCEDURE:  The patient was brought to the catheterization  lab and prepped and draped in the usual fashion.  Through an anterior  puncture, the  right femoral artery was easily entered.  6 French sheath was  placed.  Central aortic and left ventricular pressures were measured with  pigtail catheter.  Ventriculography was performed in the RAO projection.  Standard coronary arteriograms were then performed using standard Judkins  catheters.  She tolerated the procedure well.  There were no complications.  She was taken to the holding area in satisfactory clinical condition.   HEMODYNAMIC DATA:  1.  Central aorta:  135/62, mean 90.  2.  Left ventricle:  130/11.  3.  No gradient on pullback across the aortic valve.   ANGIOGRAPHIC DATA:  1.  Ventriculography was performed in the RAO projection.  Overall systolic      function was preserved.  No segmental abnormalities or contracture were      identified.  2.  The coronary arteries were heavily calcified.  The right coronary was      particularly calcified.  However, no highly obstructive  lesions were      noted.  3.  The left main was free of critical disease.  4.  The LAD coursed to the apex.  There is about 30-40% narrowing at most      just beyond the origin of the major diagonal.  The major diagonal itself      has about 40-50% ostial narrowing, but looks no worse than on the      previous angiogram.  The mid and distal LAD and the distal diagonal      branch are without critical narrowing.  5.  The circumflex has about 40% narrowing at the ostium.  There is a tiny      insignificant first marginal branch with probably 50% narrowing.  The      remainder of the vessel has luminal irregularity, but not significant      focal stenosis.  6.  The right coronary  artery is a heavily calcified vessel.  There is      probably no more than about 30-40% mid narrowing in the right coronary.      No other high grade lesions are identified.   CONCLUSION:  1.  Well preserved left ventricular function.  2.  Heavily calcified coronary arteries without significant focal      obstruction.   DISPOSITION:  We will try to reassure the patient.  She may need to have  statins stopped as her CPKs and MBs are slightly elevated.  Whether or not  this from Crestor is uncertain, but certainly could be.                                               Arturo Morton. Riley Kill, M.D. [facility]    TDS/MEDQ  D:  09/01/2004  T:  09/01/2004  Job:  [id number]   cc:   Ernestina Penna, M.D.  [address] [place]  [place] [address]  Fax: [phone]

## 2011-08-27 ENCOUNTER — Encounter: Payer: Self-pay | Admitting: Cardiology

## 2011-09-02 ENCOUNTER — Encounter (HOSPITAL_BASED_OUTPATIENT_CLINIC_OR_DEPARTMENT_OTHER)
Admission: RE | Admit: 2011-09-02 | Discharge: 2011-09-02 | Disposition: A | Discharge status: Home / Self Care | Payer: Medicare Other | Source: Ambulatory Visit | Attending: Physical Medicine and Rehabilitation | Admitting: Physical Medicine and Rehabilitation

## 2011-09-02 ENCOUNTER — Other Ambulatory Visit: Payer: Self-pay | Admitting: Physical Medicine and Rehabilitation

## 2011-09-02 ENCOUNTER — Ambulatory Visit
Admission: RE | Admit: 2011-09-02 | Discharge: 2011-09-02 | Disposition: A | Discharge status: Home / Self Care | Payer: Medicare Other | Source: Ambulatory Visit | Attending: Physical Medicine and Rehabilitation | Admitting: Physical Medicine and Rehabilitation

## 2011-09-02 DIAGNOSIS — Z01811 Encounter for preprocedural respiratory examination: Secondary | ICD-10-CM

## 2011-09-02 LAB — BASIC METABOLIC PANEL
Calcium: 9.3 mg/dL (ref 8.4–10.5)
GFR calc Af Amer: >60 mL/min (ref >60)
GFR calc non Af Amer: >60 mL/min (ref >60)
Glucose, Bld: 89 mg/dL (ref 70–99)
Sodium: 140 mEq/L (ref 135–145)

## 2011-09-04 ENCOUNTER — Ambulatory Visit (HOSPITAL_BASED_OUTPATIENT_CLINIC_OR_DEPARTMENT_OTHER)
Admission: RE | Admit: 2011-09-04 | Discharge: 2011-09-04 | Disposition: A | Discharge status: Home / Self Care | Payer: Medicare Other | Source: Ambulatory Visit | Attending: Physical Medicine and Rehabilitation | Admitting: Physical Medicine and Rehabilitation

## 2011-09-04 ENCOUNTER — Ambulatory Visit (HOSPITAL_COMMUNITY): Payer: Medicare Other

## 2011-09-04 DIAGNOSIS — M48061 Spinal stenosis, lumbar region without neurogenic claudication: Secondary | ICD-10-CM | POA: Insufficient documentation

## 2011-09-04 DIAGNOSIS — M545 Low back pain, unspecified: Secondary | ICD-10-CM | POA: Insufficient documentation

## 2011-09-04 DIAGNOSIS — K219 Gastro-esophageal reflux disease without esophagitis: Secondary | ICD-10-CM | POA: Insufficient documentation

## 2011-09-04 DIAGNOSIS — G4733 Obstructive sleep apnea (adult) (pediatric): Secondary | ICD-10-CM | POA: Insufficient documentation

## 2011-09-04 DIAGNOSIS — G609 Hereditary and idiopathic neuropathy, unspecified: Secondary | ICD-10-CM | POA: Insufficient documentation

## 2011-09-04 DIAGNOSIS — F411 Generalized anxiety disorder: Secondary | ICD-10-CM | POA: Insufficient documentation

## 2011-09-04 DIAGNOSIS — E669 Obesity, unspecified: Secondary | ICD-10-CM | POA: Insufficient documentation

## 2011-09-04 DIAGNOSIS — IMO0002 Reserved for concepts with insufficient information to code with codable children: Secondary | ICD-10-CM | POA: Insufficient documentation

## 2011-09-04 DIAGNOSIS — Z01810 Encounter for preprocedural cardiovascular examination: Secondary | ICD-10-CM | POA: Insufficient documentation

## 2011-09-04 DIAGNOSIS — G894 Chronic pain syndrome: Secondary | ICD-10-CM | POA: Insufficient documentation

## 2011-09-04 DIAGNOSIS — I10 Essential (primary) hypertension: Secondary | ICD-10-CM | POA: Insufficient documentation

## 2011-09-04 DIAGNOSIS — Z01812 Encounter for preprocedural laboratory examination: Secondary | ICD-10-CM | POA: Insufficient documentation

## 2011-09-16 LAB — BASIC METABOLIC PANEL
BUN: 12
Calcium: 9
GFR calc non Af Amer: >60
Glucose, Bld: 112 — ABNORMAL HIGH
Sodium: 141

## 2011-09-16 LAB — POCT I-STAT 3, ART BLOOD GAS (G3+)
Acid-Base Excess: 1
Bicarbonate: 26.9 — ABNORMAL HIGH
Operator id: 149272

## 2011-09-25 LAB — DIFFERENTIAL
Eosinophils Absolute: 0.1 10*3/uL (ref 0.0–0.7)
Eosinophils Relative: 1 % (ref 0–5)
Lymphs Abs: 1.8 10*3/uL (ref 0.7–4.0)
Monocytes Absolute: 0.3 10*3/uL (ref 0.1–1.0)

## 2011-09-25 LAB — CBC
HCT: 37.1 % (ref 36.0–46.0)
Hemoglobin: 12.5 g/dL (ref 12.0–15.0)
MCV: 92.1 fL (ref 78.0–100.0)
Platelets: 175 10*3/uL (ref 150–400)
WBC: 5.6 10*3/uL (ref 4.0–10.5)

## 2011-09-25 LAB — COMPREHENSIVE METABOLIC PANEL
ALT: 31 U/L (ref 0–35)
AST: 33 U/L (ref 0–37)
Albumin: 3.9 g/dL (ref 3.5–5.2)
Alkaline Phosphatase: 69 U/L (ref 39–117)
Potassium: 4.5 mEq/L (ref 3.5–5.1)
Sodium: 140 mEq/L (ref 135–145)
Total Protein: 6.6 g/dL (ref 6.0–8.3)

## 2011-10-19 ENCOUNTER — Telehealth: Payer: Self-pay | Admitting: Cardiology

## 2011-10-19 NOTE — Telephone Encounter (Signed)
Pt returning your call

## 2011-10-19 NOTE — Telephone Encounter (Signed)
I spoke with the pt and she is trying to get clearance for cosmetic surgery (eyelids and breast reduction).  These surgeries are not scheduled at this time.  The pt said that she thinks she has been on plavix since 2003 or 2005.  She said that Dr Riley Kill started this medication due to a "slight stroke in 2005" and a blockage by cardiac cath.  I made the pt aware that she will need to be evaluated by Dr Riley Kill to address surgical clearance.  Appointment scheduled on 11/16/11.

## 2011-10-21 NOTE — Op Note (Signed)
  NAME:  [name], [name]               ACCOUNT NO.:  [id number]  MEDICAL RECORD NO.:  [id number]  LOCATION:                                 FACILITY:  PHYSICIAN:  Delrick Dehart D. Ethelene Hal, M.D. DATE OF BIRTH:  Jun 19, 1941  DATE OF PROCEDURE: DATE OF DISCHARGE:                              OPERATIVE REPORT   [name] is a 70 year old female who has chronic pain syndrome.  She has had chronic pain in her left side of her lower back going into the buttock and into the left lower limb.  She has been evaluated by Dr. Shon Baton, but surgery has not been recommended at this point in time.  She has failed conservative care including medications and physical therapy as well as interventional pain procedures.  DIAGNOSES:  Spinal stenosis, chronic pain syndrome, left leg pain, radiculopathy.  PROCEDURE:  Percutaneous placement of the St. Jude Medical Octrode trial lead kit, 60 cm length, reference number [id number], lot number [id number].  ANESTHESIA:  Conscious sedation.  DESCRIPTION:  After informed consent was signed, the patient was brought back to the operating room and placed prone on the operating table.  She was premedicated with a gram of Ancef IV.  The skin was cleansed with Chlorascrub prep.  She was sterilely draped.  With a 14 gauge Tuohy 3.5- inch spinal needle, I placed this in the L2-3 interspace in the midline using AP fluoroscopic imaging.  Epidural space was localized using loss of resistance technique.  I then placed the St. Jude trial lead into the introducer needle and got this to the left of midline to the T9 vertebral body.  I was primarily staying to the left of midline since she was only experiencing pain in the left lower limb.  We then hooked up the spinal cord stimulator to the impulse generator and got excellent coverage in her back.  She had been into the left lower limb.  She might have had some lack of coverage in the left lateral hip area and this was the only area that did  not cover her usual pain.  I felt that this was the most optimal position that I could get it in today.  I withdrew the introducer needle as well as the stylet.  I checked the spinal cord stimulator lead under AP fluoroscopic imaging to make sure that it had not moved and to make sure that it was appropriately in the posterior epidural space.  I then sutured the lead to the skin with 2-0 Prolene x2.  Pressure bandage was applied.  The patient tolerated the procedure fairly well.  She was brought back to the recovery room in stable condition.  She will follow up with me in a week.  Appropriate discharge instructions were given prior to the intervention.  I will assess her in a week to see if she got good coverage and whether or not she wants to convert this to a permanent stimulator.     Kashae Carstens D. Ethelene Hal, M.D.     RDR/MEDQ  D:  09/04/2011  T:  09/04/2011  Job:  [id number]  Electronically Signed by Sheran Luz M.D. on 10/21/2011 01:35:34 PM

## 2011-11-10 ENCOUNTER — Telehealth: Payer: Self-pay | Admitting: Gastroenterology

## 2011-11-10 NOTE — Telephone Encounter (Signed)
Spoke with pt to inform her she needs to keep her appt on 11/24/11 with Dr Jarold Motto; he will decide if Dr Christella Hartigan needs to do the procedure. Pt stated understanding.

## 2011-11-16 ENCOUNTER — Ambulatory Visit: Payer: Medicare Other | Admitting: Cardiology

## 2011-11-17 ENCOUNTER — Ambulatory Visit (INDEPENDENT_AMBULATORY_CARE_PROVIDER_SITE_OTHER): Payer: Medicare Other | Admitting: Cardiology

## 2011-11-17 VITALS — BP 107/68 | HR 78 | Wt 193.0 lb

## 2011-11-17 DIAGNOSIS — G4733 Obstructive sleep apnea (adult) (pediatric): Secondary | ICD-10-CM

## 2011-11-17 DIAGNOSIS — K449 Diaphragmatic hernia without obstruction or gangrene: Secondary | ICD-10-CM

## 2011-11-17 DIAGNOSIS — I2510 Atherosclerotic heart disease of native coronary artery without angina pectoris: Secondary | ICD-10-CM

## 2011-11-17 NOTE — Progress Notes (Signed)
Patient ID: [name] [name], female   DOB: 01-11-1941, 70 y.o.   MRN: [id number]

## 2011-11-17 NOTE — Assessment & Plan Note (Addendum)
She had non obstructive disease and was last studied in 2009.  Her activity level is enough that this should noit be prohibitive for eye surgery.  Does she really need this is the question---she does not seem to know that answer and I asked her to ask.  She does not have a stent, and plavix could be held.  She has had prior stroke, although I do not have information on this.  She will remain on chronic plavix.

## 2011-11-17 NOTE — Assessment & Plan Note (Signed)
On multiple drugs under the care of Dr. Christell Constant.

## 2011-11-17 NOTE — Progress Notes (Signed)
HPI:  Patient is in for follow up.  She wants to have a belpharoplasty with Co2 laser.  She has some chest discomfort, but she is convinced it is all related to her reflux.  It is there most of the time.  She is on plavix, which she has taken since the time of her stroke.  I did not see her at that time.  Cardiac wise, she gets around without exertional chest pain, and her cath day from 2009 is in the note.  This demonstrated non critical disease in a number of areas.  She also wants to have hiatal hernia surgery.  She does not have problems with exertion.  It should also be noted that she has a history of OSA, and was never able or successful using a CPAP device.  I do not know where this stands.  She is not having excessive fatigue, and has lost weight during which she had "esophageal" zoster.   The patient does have a large paraesophageal gastric hernia.    Current Outpatient Prescriptions  Medication Sig Dispense Refill  . amLODipine (NORVASC) 2.5 MG tablet Take 2.5 mg by mouth daily.        . calcium carbonate (TUMS - DOSED IN MG ELEMENTAL CALCIUM) 500 MG chewable tablet Chew 1 tablet by mouth 2 (two) times daily.        . colesevelam (WELCHOL) 625 MG tablet Take 1,875 mg by mouth 2 (two) times daily with a meal.        . esomeprazole (NEXIUM) 40 MG capsule Take 40 mg by mouth daily before breakfast.        . fentaNYL (DURAGESIC - DOSED MCG/HR) 50 MCG/HR Place 1 patch onto the skin every 3 (three) days.        . furosemide (LASIX) 40 MG tablet Take 40 mg by mouth at bedtime.        Marland Kitchen HYDROcodone-acetaminophen (NORCO) 10-325 MG per tablet Take 1 tablet by mouth every 6 (six) hours as needed.        . Multiple Vitamin (MULTIVITAMIN) tablet Take 1 tablet by mouth daily.        Marland Kitchen olmesartan (BENICAR) 20 MG tablet Take 20 mg by mouth daily. One half tablet daily       . potassium chloride SA (K-DUR,KLOR-CON) 20 MEQ tablet Take 20 mEq by mouth daily.        . rosuvastatin (CRESTOR) 10 MG tablet Take 10  mg by mouth daily. One tablet every other day       . vitamin C (ASCORBIC ACID) 500 MG tablet Take 500 mg by mouth daily.          Allergies  Allergen Reactions  . Duloxetine     REACTION: agitaded- weight gain  . Oxycodone-Acetaminophen     Past Medical History  Diagnosis Date  . Obesity   . Circadian rhythm sleep disorder   . CTS (carpal tunnel syndrome)   . Diarrhea   . IBS (irritable bowel syndrome)   . Diverticulitis of colon   . Gastritis   . Esophageal stricture   . Colonic polyp   . Hiatal hernia   . GERD (gastroesophageal reflux disease)   . Obstructive sleep apnea   . Insomnia   . HTN (hypertension)   . Hyperlipidemia   . Depression     Past Surgical History  Procedure Date  . Carpal tunnel release   . Cholecystectomy 2003  . Knee arthroplasty 2003    Right total  knee    Family History  Problem Relation Age of Onset  . Coronary artery disease Father   . Peripheral vascular disease Father   . Coronary artery disease Mother   . Coronary artery disease Brother   . Peripheral vascular disease Brother   . Emphysema Brother   . Colon cancer Sister   . Emphysema Sister   . Sleep apnea Son     History   Social History  . Marital Status: Widowed    Spouse Name: N/A    Number of Children: 2  . Years of Education: N/A   Occupational History  . librarian     retired   Social History Main Topics  . Smoking status: Never Smoker   . Smokeless tobacco: Not on file  . Alcohol Use: No  . Drug Use: Not on file  . Sexually Active: Not on file   Other Topics Concern  . Not on file   Social History Narrative  . No narrative on file    ROS: Please see the HPI.  All other systems reviewed and negative.  PHYSICAL EXAM:  BP 107/68  Pulse 78  Wt 87.544 kg (193 lb)  General: Well developed, well nourished, in no acute distress. Head:  Normocephalic and atraumatic. Neck: no JVD Lungs: Clear to auscultation and percussion. Heart: Normal S1 and  S2.  No murmur, rubs or gallops.  Abdomen:  Normal bowel sounds; soft; non tender; no organomegaly Pulses: Pulses normal in all 4 extremities. Extremities: No clubbing or cyanosis. No edema. Neurologic: Alert and oriented x 3.  EKG:  CATH 2009 ANGIOGRAPHIC DATA:   1. Ventriculography done in the RAO projection reveals vigorous global       systolic function without segmental wall motion abnormality.   2. The coronaries are generally calcified, similar to the previous       study with some perhaps advancement and calcification in the LAD.   3. The left main is free of critical disease.   4. The LAD has some diffuse mild plaquing of approximately 30% in the       mid vessel.  The first diagonal also has perhaps 30-40% narrowing.       There is some moderate calcification near the LAD diagonal       bifurcation.  The remainder of the vessel has luminal irregularity,       but no significant focal obstruction.   5. The circumflex demonstrates an ostial 50% stenosis.  The lumen does       not appear to be significantly compromised, and comparing shot per       shot with the previous angiographic study does not appear to       demonstrate significant progression.  Mild progression can       certainly not be excluded.  The remainder of the circumflex system       is without critical narrowing.   6. The right coronary artery is a large-caliber vessel with a smaller       bifurcating posterior descending and a larger posterolateral       branch.  The mid vessel demonstrates a fairly long segmental area       of plaquing that would measure 30-40% luminal reduction diffusely.      CONCLUSIONS:   1. Well-preserved left ventricular function.   2. Calcification of the coronary arteries.   3. Nonobstructive coronary plaque.      DISPOSITION:  We will continue to evaluate the  patient.  We did do a   blood gas today; the pO2 was 65 and pH was normal.  Oxygen saturation is   92%.  D-dimer is  being obtained.  We may consider CT scanning of the   chest and continued followup with Pulmonary Medicine.  The patient has   severe sleep apnea.       ASSESSMENT AND PLAN:

## 2011-11-17 NOTE — Patient Instructions (Signed)
Your physician recommends that you continue on your current medications as directed. Please refer to the Current Medication list given to you today.  Your physician wants you to follow-up in: 6 MONTHS. You will receive a reminder letter in the mail two months in advance. If you don't receive a letter, please call our office to schedule the follow-up appointment.  

## 2011-11-17 NOTE — Assessment & Plan Note (Signed)
Stable.  Controlled today.

## 2011-11-23 ENCOUNTER — Encounter: Payer: Self-pay | Admitting: *Deleted

## 2011-11-24 ENCOUNTER — Encounter: Payer: Self-pay | Admitting: Gastroenterology

## 2011-11-24 ENCOUNTER — Other Ambulatory Visit (INDEPENDENT_AMBULATORY_CARE_PROVIDER_SITE_OTHER): Payer: Medicare Other

## 2011-11-24 ENCOUNTER — Ambulatory Visit (INDEPENDENT_AMBULATORY_CARE_PROVIDER_SITE_OTHER): Payer: Medicare Other | Admitting: Gastroenterology

## 2011-11-24 DIAGNOSIS — K6389 Other specified diseases of intestine: Secondary | ICD-10-CM

## 2011-11-24 DIAGNOSIS — E739 Lactose intolerance, unspecified: Secondary | ICD-10-CM | POA: Insufficient documentation

## 2011-11-24 DIAGNOSIS — K219 Gastro-esophageal reflux disease without esophagitis: Secondary | ICD-10-CM

## 2011-11-24 DIAGNOSIS — R197 Diarrhea, unspecified: Secondary | ICD-10-CM

## 2011-11-24 DIAGNOSIS — Z86010 Personal history of colonic polyps: Secondary | ICD-10-CM

## 2011-11-24 DIAGNOSIS — K529 Noninfective gastroenteritis and colitis, unspecified: Secondary | ICD-10-CM

## 2011-11-24 MED ORDER — PEG-KCL-NACL-NASULF-NA ASC-C 100 G PO SOLR: 1.0000 | Freq: Once | ORAL | Status: DC

## 2011-11-24 MED ORDER — RIFAXIMIN 550 MG PO TABS: 550.0000 mg | ORAL_TABLET | Freq: Two times a day (BID) | ORAL | Status: DC

## 2011-11-24 MED ORDER — ALIGN 4 MG PO CAPS: 1.0000 | ORAL_CAPSULE | Freq: Every day | ORAL | Status: DC

## 2011-11-24 NOTE — Progress Notes (Addendum)
History of Present Illness:  This is a 70 year old Caucasian female with multiple medical problems and chronic somatic syndrome. She has numerous GI issues, mostly chronic diarrhea syndrome postcholecystectomy ,partially relieved with Welchol 2 tablets a day. She continues to complain of gas and bloating and acid reflux symptoms. She takes Nexium 40 mg twice a day. Her main complaint is mid abdominal pain with gas and bloating, no true reflux symptoms or dysphagia, melena or  hematochezia , anorexia or weight loss. She uses a large amount of sorbitol with diet gum that she uses because of dry mouth syndrome. Apparently she has not been diagnosed with Sjgren syndrome or any type of collagen vascular disease. She has multiple arthralgias and myalgias, but does not have Raynaud's phenomena. There is a history of previous TIA, and she apparently takes Plavix 75 mg a day, and is followed by Dr. Bonnee Quin in cardiology. There is no history of previous hepatitis or pancreatitis.  I have reviewed this patient's present history, medical and surgical past history, allergies and medications.     ROS: The remainder of the 10 point ROS is negative.... numerous arthralgias and myalgias. She apparently had shingles of her head and neck area in February of this year. Family history is remarkable for 2 sisters with colon cancer. Her last colonoscopy was 3 years ago with removal of 2 adenomatous polyps.     Physical Exam: General well developed well nourished patient in no acute distress, appearing her stated age Eyes PERRLA, no icterus, fundoscopic exam per opthamologist Skin no lesions noted Neck supple, no adenopathy, no thyroid enlargement, no tenderness Chest clear to percussion and auscultation Heart no significant murmurs, gallops or rubs noted Abdomen no hepatosplenomegaly masses or tenderness, BS normal.  Extremities no acute joint lesions, edema, phlebitis or evidence of cellulitis. Neurologic patient  oriented x 3, cranial nerves intact, no focal neurologic deficits noted. Psychological mental status normal and normal affect.  Assessment and plan:Probable bacterial overgrowth syndrome accounting for her abdominal discomfort, gas, bloating, and diarrhea with an element of bile-salt enteropathy post cholecystectomy. I have placed her on Xifaxan 550 mg twice a day for 2 weeks with Align probiotic therapy. I have scheduled followup colonoscopy and endoscopy with propofol sedation. We will hold her Plavix 5 days before these procedures. Hopefully we will be a to decrease her PPI doses.is to continue her other medications as listed and reviewed. She is to discontinue the use of sorbitol and fructose in her diet, and In have given her a list of these products to avoid. No diagnosis found.

## 2011-11-24 NOTE — Patient Instructions (Signed)
Your procedure has been scheduled for 12/02/2011, please follow the seperate instructions.  We will contact you about holding your Plavix, after we hear from Dr Tedra Senegal. Your prescription(s) have been sent to you pharmacy.

## 2011-11-25 ENCOUNTER — Telehealth: Payer: Self-pay | Admitting: *Deleted

## 2011-11-25 LAB — CELIAC PANEL 10
Endomysial Screen: NEGATIVE
Gliadin IgA: 6.5 U/mL (ref <20)
Tissue Transglut Ab: 5 U/mL (ref <20)
Tissue Transglutaminase Ab, IgA: 4 U/mL (ref <20)

## 2011-11-25 NOTE — Telephone Encounter (Signed)
Message copied by Leonette Monarch on Wed Nov 25, 2011  3:41 PM ------      Message from: Jarold Motto, DAVID R      Created: Wed Nov 25, 2011  3:26 PM       ALL LABS NORMAL,,PLEASE CALL HER.Marland KitchenMarland Kitchen

## 2011-11-25 NOTE — Telephone Encounter (Signed)
Pt aware.

## 2011-11-27 ENCOUNTER — Telehealth: Payer: Self-pay | Admitting: *Deleted

## 2011-11-27 NOTE — Telephone Encounter (Signed)
Pt aware.

## 2011-11-27 NOTE — Telephone Encounter (Signed)
Dr Jarold Motto I have sent the coag letter to Dr Riley Kill twice and flagged him and his nurse about the letter and clearance. I also called and have had no luck getting a response that way either. I called pt and she says that Dr Riley Kill told her to hold her Plavix but I can not see a note of that except in his office note from 11/17/2011. Cardiology says that Dr Riley Kill will not be back in the office until 12/02/2011 and pts appt is 12/02/2011. Do you want to hold plavix, keep pt on plavix or reschedule until we can get an answer from Dr Riley Kill??

## 2011-11-27 NOTE — Telephone Encounter (Signed)
HOLD PLAVIX 5 DAYS.Marland KitchenMarland Kitchen

## 2011-11-27 NOTE — Telephone Encounter (Signed)
Message copied by Leonette Monarch on Fri Nov 27, 2011  9:25 AM ------      Message from: Harlow Mares D      Created: Tue Nov 24, 2011  3:59 PM       Check on plavix letter

## 2011-11-30 ENCOUNTER — Encounter: Payer: Self-pay | Admitting: Cardiology

## 2011-12-02 ENCOUNTER — Encounter: Payer: Self-pay | Admitting: Gastroenterology

## 2011-12-02 ENCOUNTER — Ambulatory Visit (AMBULATORY_SURGERY_CENTER): Payer: Medicare Other | Admitting: Gastroenterology

## 2011-12-02 DIAGNOSIS — D1330 Benign neoplasm of unspecified part of small intestine: Secondary | ICD-10-CM

## 2011-12-02 DIAGNOSIS — K449 Diaphragmatic hernia without obstruction or gangrene: Secondary | ICD-10-CM

## 2011-12-02 DIAGNOSIS — Z86010 Personal history of colonic polyps: Secondary | ICD-10-CM

## 2011-12-02 DIAGNOSIS — K6389 Other specified diseases of intestine: Secondary | ICD-10-CM

## 2011-12-02 DIAGNOSIS — R197 Diarrhea, unspecified: Secondary | ICD-10-CM

## 2011-12-02 DIAGNOSIS — Z800 Family history of malignant neoplasm of digestive organs: Secondary | ICD-10-CM

## 2011-12-02 DIAGNOSIS — Z1211 Encounter for screening for malignant neoplasm of colon: Secondary | ICD-10-CM

## 2011-12-02 DIAGNOSIS — K219 Gastro-esophageal reflux disease without esophagitis: Secondary | ICD-10-CM

## 2011-12-02 DIAGNOSIS — D126 Benign neoplasm of colon, unspecified: Secondary | ICD-10-CM

## 2011-12-02 MED ORDER — SODIUM CHLORIDE 0.9 % IV SOLN: 500.0000 mL | INTRAVENOUS | Status: DC

## 2011-12-02 NOTE — Patient Instructions (Signed)
FOLLOW DISCHARGE INSTRUCTIONS ON THE GREEN AND BLUE INSTRUCTION SHEETS.  CONTINUE YOUR MEDICATIONS.  RESTART YOUR PLAVIX.  CONTINUE YOUR NEXIUM.  AWAIT PATHOLOGY RESULTS.

## 2011-12-02 NOTE — Progress Notes (Signed)
PROPOFOL ADMINISTERED BY S CAMP CRNA. SEE SCANNED INTRA PROCEDURE REPORT. EWM

## 2011-12-02 NOTE — Op Note (Signed)
[facility] [address] [place], [place]  [address]  ENDOSCOPY PROCEDURE REPORT  PATIENT:  [name], [name]  MR#:  [id number] BIRTHDATE:  1941-03-28, 70 yrs. old  GENDER:  female  ENDOSCOPIST:  Vania Rea. Jarold Motto, MD, [facility] Referred by:  PROCEDURE DATE:  12/02/2011 PROCEDURE:  EGD with biopsy, 19147 ASA CLASS:  Class III INDICATIONS:  GERD gas and bloating  MEDICATIONS:   There was residual sedation effect present from prior procedure., propofol (Diprivan) 140 mg IV TOPICAL ANESTHETIC:  DESCRIPTION OF PROCEDURE:   After the risks and benefits of the procedure were explained, informed consent was obtained.  The LB [id number] [id number] endoscope was introduced through the mouth and advanced to the second portion of the duodenum.  The instrument was slowly withdrawn as the mucosa was fully examined. <<PROCEDUREIMAGES>>  A hiatal hernia was found. prominant 5 cm HH NOTED.  A stricture was found at the gastroesophageal junction. DILATED WITH SCOPE The duodenal bulb was normal in appearance, as was the postbulbar duodenum. BIOPSIES DONE.    Retroflexed views revealed a hiatal hernia.    The scope was then withdrawn from the patient and the procedure completed.  COMPLICATIONS:  None  ENDOSCOPIC IMPRESSION: 1) Hiatal hernia 2) Stricture at the gastroesophageal junction 3) Normal duodenum 1.R/O CELIAC DISEASE 2.HH AND STRICTURE RECOMMENDATIONS: 1) Await biopsy results RESTART PPI RX.NEXIUM RX.  ______________________________ Vania Rea. Jarold Motto, MD, [facility]  CC:  Rudi Heap, MD  n. Rosalie Doctor:   Vania Rea. Mikenzi Raysor at 12/02/2011 03:19 PM  [place], [place], [phone]

## 2011-12-02 NOTE — Op Note (Signed)
[facility] [address] [place], [place]  [address]  COLONOSCOPY PROCEDURE REPORT  PATIENT:  [name], [name]  MR#:  [id number] BIRTHDATE:  08-29-1941, 70 yrs. old  GENDER:  female ENDOSCOPIST:  Vania Rea. Jarold Motto, MD, [facility] REF. BY: PROCEDURE DATE:  12/02/2011 PROCEDURE:  Colonoscopy with snare polypectomy ASA CLASS:  Class III INDICATIONS:  history of pre-cancerous (adenomatous) colon polyps, Abdominal pain gas and bloating MEDICATIONS:   propofol (Diprivan) 120 mg IV  DESCRIPTION OF PROCEDURE:   After the risks and benefits and of the procedure were explained, informed consent was obtained. Digital rectal exam was performed and revealed no abnormalities. The [id number] endoscope was introduced through the anus and advanced to the cecum, which was identified by both the appendix and ileocecal valve.  The quality of the prep was excellent, using MoviPrep.  The instrument was then slowly withdrawn as the colon was fully examined. <<PROCEDUREIMAGES>>  FINDINGS:  ENDOSCOPIC FINDINGS:   A sessile polyp was found at the hepatic flexure. 0mm sessile hepatic flexure polyp hot snare excised. There were mild diverticular changes in left colon. diverticulosis was found.  A sessile polyp was found in the cecum. 15 mm cecal polyp hot snare excised   Retroflexed views in the rectum revealed no abnormalities.    The scope was then withdrawn from the patient and the procedure completed.  COMPLICATIONS:  None ENDOSCOPIC IMPRESSION: 1) Diverticulosis,mild,left sided diverticulosis 2) Sessile polyp in the cecum 3) Sessile polyp at the hepatic flexure RECURRENT ADENOMAS. RECOMMENDATIONS: 1) Titrate to need 2) Repeat Colonoscopy in 3 years.  REPEAT EXAM:  No  ______________________________ Vania Rea. Jarold Motto, MD, [facility]  CC:  Rudi Heap, MD  n. Rosalie Doctor:   Vania Rea. Kalianna Verbeke at 12/02/2011 03:07 PM  [place], [place], [phone]

## 2011-12-02 NOTE — Progress Notes (Signed)
PATIENT GIVEN SAMPLES OF XIFAXAN BY DR. PATTERSON. PATIENT TO BATHROOM WITH DAUGHTER PRIOR TO DISCHARGE.

## 2011-12-02 NOTE — Progress Notes (Signed)
Patient did not experience any of the following events: a burn prior to discharge; a fall within the facility; wrong site/side/patient/procedure/implant event; or a hospital transfer or hospital admission upon discharge from the facility. (G8907) Patient did not have preoperative order for IV antibiotic SSI prophylaxis. (G8918)  

## 2011-12-03 ENCOUNTER — Telehealth: Payer: Self-pay | Admitting: Cardiology

## 2011-12-03 ENCOUNTER — Telehealth: Payer: Self-pay | Admitting: *Deleted

## 2011-12-03 NOTE — Telephone Encounter (Signed)
Spoke with Tiffany, pt is not going to have surgery until the end of jan. Will forward to dr Riley Kill and his nurse for review

## 2011-12-03 NOTE — Telephone Encounter (Signed)
Pt is having a breast reduction and needs a letter of clearance sent to office

## 2011-12-03 NOTE — Telephone Encounter (Signed)
Called number given in admitting 2 times. Someone picked up the phone each time and then would not speak. Called pts name but still no answer. ewm

## 2011-12-08 ENCOUNTER — Encounter: Payer: Self-pay | Admitting: Gastroenterology

## 2011-12-16 NOTE — Assessment & Plan Note (Signed)
Untreated and without long term plan.  This is likely an issue from a medical standpoint.

## 2011-12-16 NOTE — Assessment & Plan Note (Signed)
This apparently is significant, and she has a goal of having surgery.  If this is major, then she might need imaging before surgery, although her activity level does not suggest active ischemia is a problem.  If minimally invasive, further work up would not be required.  If an open chest procedure is required, then that might require reconsideration.

## 2011-12-18 NOTE — Telephone Encounter (Signed)
Per Dr Riley Kill he will append the pt's last office note about clearance for breast reduction.

## 2011-12-29 NOTE — Telephone Encounter (Signed)
Per Dr Riley Kill this pt can discontinue plavix all together. I will clarify if the pt can proceed with breast reduction.

## 2011-12-31 ENCOUNTER — Ambulatory Visit: Payer: Medicare Other | Admitting: Gastroenterology

## 2012-01-04 NOTE — Telephone Encounter (Signed)
She underwent cath in 2009, and is currently stable.  She has other co-morbid processes.  There is not a firm reason to limit her ability at present to undergo elective surgery.  She is a candidate and does not present prohibitive coronary risk..  Conversely, the type of procedure, how it will help her, and its importance to her long term outcome must be considered in the matrix.  The surgeon and patient must decide the relative risks and benefits in the overall context of risk given this type of procedure.  She has OSA, prior CVA, and is over 70 years.  This must be considered in all elective, noncritical procedures.

## 2012-01-06 NOTE — Telephone Encounter (Signed)
I spoke with the pt and made her aware that from a cardiac standpoint she can discontinue plavix.  At this time the pt would like to proceed with breast reduction.  I read the pt all of Dr Rosalyn Charters comments and made her aware that is up to her and the surgeon to make a final decision about proceeding with elective surgery. I will send this note to Dr Shon Hough.

## 2012-01-20 ENCOUNTER — Encounter: Payer: Self-pay | Admitting: *Deleted

## 2012-01-25 ENCOUNTER — Encounter: Payer: Self-pay | Admitting: Gastroenterology

## 2012-01-25 ENCOUNTER — Ambulatory Visit (INDEPENDENT_AMBULATORY_CARE_PROVIDER_SITE_OTHER): Payer: Medicare Other | Admitting: Gastroenterology

## 2012-01-25 VITALS — BP 100/60 | HR 65 | Ht 66.0 in | Wt 197.2 lb

## 2012-01-25 DIAGNOSIS — R197 Diarrhea, unspecified: Secondary | ICD-10-CM

## 2012-01-25 DIAGNOSIS — Z8719 Personal history of other diseases of the digestive system: Secondary | ICD-10-CM

## 2012-01-25 DIAGNOSIS — K529 Noninfective gastroenteritis and colitis, unspecified: Secondary | ICD-10-CM

## 2012-01-25 MED ORDER — RABEPRAZOLE SODIUM 20 MG PO TBEC: 20.0000 mg | DELAYED_RELEASE_TABLET | Freq: Every day | ORAL | Status: DC

## 2012-01-25 MED ORDER — BUDESONIDE 3 MG PO CP24: 9.0000 mg | ORAL_CAPSULE | Freq: Two times a day (BID) | ORAL | Status: DC

## 2012-01-25 NOTE — Progress Notes (Signed)
This is a complex 71 year old Caucasian female with multiple medical problems who has chronic, chronic diarrhea of unexplained etiology refractory over the last 10 years to all attempts to treat her diarrhea including recent treatment for bacterial overgrowth syndrome with probiotics and Xifaxan. Status post cholecystectomy and is on Welchol 625 mg twice a day. Recent colonoscopy was unremarkable except for adenomatous polyps which were excised. Ileal biopsy was negative. Cannot see where colon biopsies were obtained. Review of her records otherwise shows normal labs. This particular problem goes back over 10 years. She does have chronic GERD and has been on Nexium 40 mg a day for several years. She denies episodes of constipation or food intolerances. No systemic complaints such as fever, chills, skin rash, joint pains et Karie Soda. She has a daughter with severe IBS.  Current Medications, Allergies, Past Medical History, Past Surgical History, Family History and Social History were reviewed in [employer] record.  Pertinent Review of Systems Negative   Physical Exam: Healthy appearing patient with no evidence of chronic liver disease. Abdominal exam is unremarkable.    Assessment and Plan: Functional GI issues versus microscopic colitis. I still feel that much of her problem is IBS with an element of bile-salt enteropathy, but she has not responded to any therapy. I will give her an empiric 2 week trial OF Entocort 9 mg a day and discontinue Nexium and try AcipHex 20 mg a day. The patient complains that her diarrhea is worse since her colonoscopy exam supporting a functional diagnosis. She continues to have serious diarrhea, we will refer her to [facility] for another opinion before starting serotonin antagonists {lotronex]. Again, I am surprised that we did not obtain colon biopsies, but I am  reluctant to put her through another procedure at this time. She is to call in 2  weeks time for progress report. No diagnosis found.

## 2012-01-25 NOTE — Patient Instructions (Addendum)
Stop your Nexium and start Aciphex one tablet by mouth once a day.  Take Entocort three tabs every morning and call us in 3 weeks to give an update on how you are doing.     after pt left she decided she wanted rxs sent to local pharmacy I called mail order pharmacy and spoke with Renae Fickle, PhD and sent the rxs to the patients local pharmacy per her request.

## 2012-01-26 DIAGNOSIS — K529 Noninfective gastroenteritis and colitis, unspecified: Secondary | ICD-10-CM | POA: Insufficient documentation

## 2012-01-26 DIAGNOSIS — Z8719 Personal history of other diseases of the digestive system: Secondary | ICD-10-CM | POA: Insufficient documentation

## 2012-02-19 ENCOUNTER — Other Ambulatory Visit: Payer: Self-pay | Admitting: Gastroenterology

## 2012-04-14 ENCOUNTER — Telehealth: Payer: Self-pay | Admitting: Gastroenterology

## 2012-04-15 MED ORDER — DIPHENOXYLATE-ATROPINE 2.5-0.025 MG PO TABS: 1.0000 | ORAL_TABLET | Freq: Four times a day (QID) | ORAL | Status: DC | PRN

## 2012-04-15 NOTE — Telephone Encounter (Signed)
On 01/25/12 visit, pt was started on Budesonide 3mg  capsules. Per our records she was to take 9mg  bid and then cut back to 9mg  daily. Pt reports her s&s returned when she decreased the Entocort- diarrhea. Pt would like to increase to 9mg  BID again. Please advise. Thanks.

## 2012-04-15 NOTE — Telephone Encounter (Signed)
Faxed order for Lomotil. Called [facility], [phone] and pt scheduled for 07/18/12, 2013 at 10:30am with Dr Jacinto Reap. Marilynne Drivers will mail her information. Faxed records to [phone]. Mailed pt a letter with appt info.

## 2012-04-15 NOTE — Telephone Encounter (Signed)
Informed pt Dr Jarold Motto would like to refer her to [facility]; we will order Lomotil and he will not refill the Entocort. Pt stated understanding and will be out of town for a week in scheduling the referral.

## 2012-04-15 NOTE — Telephone Encounter (Signed)
Pt reports she can't go anywhere due to the diarrhea; she is taking welchol. Can we order lomotil for her? Ok to refill the Entocort for 9mg  daily? Thanks.

## 2012-04-15 NOTE — Telephone Encounter (Signed)
NEEDS REFERRAL TO [facility] FOR EVALUATION...LOMOTIL OK,STOP ENTOCORT

## 2012-04-15 NOTE — Telephone Encounter (Signed)
tOO MUCH...9 MG/DAY MAX !!!!

## 2012-05-03 ENCOUNTER — Ambulatory Visit (INDEPENDENT_AMBULATORY_CARE_PROVIDER_SITE_OTHER): Payer: Medicare Other | Admitting: Cardiology

## 2012-05-03 ENCOUNTER — Ambulatory Visit: Payer: Medicare Other | Admitting: Cardiology

## 2012-05-03 ENCOUNTER — Encounter: Payer: Self-pay | Admitting: Cardiology

## 2012-05-03 VITALS — BP 123/69 | HR 79 | Resp 18 | Ht 66.0 in | Wt 210.0 lb

## 2012-05-03 DIAGNOSIS — G4733 Obstructive sleep apnea (adult) (pediatric): Secondary | ICD-10-CM

## 2012-05-03 DIAGNOSIS — I2510 Atherosclerotic heart disease of native coronary artery without angina pectoris: Secondary | ICD-10-CM

## 2012-05-03 DIAGNOSIS — E785 Hyperlipidemia, unspecified: Secondary | ICD-10-CM

## 2012-05-03 NOTE — Patient Instructions (Signed)
Your physician wants you to follow-up in: 6 MONTHS.  You will receive a reminder letter in the mail two months in advance. If you don't receive a letter, please call our office to schedule the follow-up appointment.  Your physician recommends that you continue on your current medications as directed. Please refer to the Current Medication list given to you today.  

## 2012-05-03 NOTE — Progress Notes (Signed)
HPI:  Patient returns in followup.  She's been having frequent diarrhea, and according to her daughter it is way out of control. She has loose bowels all the time. Dr. Jarold Motto referred her to Dr. Jacinto Reap at [facility].  She has not been having any chest pain. The patient has a paraesophageal hernia, but has elected not to have this operated on. She wanted to have a breast reduction, but she said the surgeon felt that she should not have this done. She woke this morning with a conjunctival hemorrhage.      Current Outpatient Prescriptions  Medication Sig Dispense Refill  . amLODipine (NORVASC) 2.5 MG tablet Take 2.5 mg by mouth daily.        . colesevelam (WELCHOL) 625 MG tablet Take 1,875 mg by mouth 2 (two) times daily with a meal.        . diphenoxylate-atropine (LOMOTIL) 2.5-0.025 MG per tablet Take 1 tablet by mouth 4 (four) times daily as needed for diarrhea or loose stools.  30 tablet  1  . fentaNYL (DURAGESIC - DOSED MCG/HR) 50 MCG/HR Place 1 patch onto the skin every 3 (three) days.        Marland Kitchen FLUoxetine (PROZAC) 40 MG capsule Take 40 mg by mouth daily.        . furosemide (LASIX) 40 MG tablet Take 40 mg by mouth at bedtime.        Marland Kitchen HYDROcodone-acetaminophen (NORCO) 10-325 MG per tablet Take 1 tablet by mouth every 6 (six) hours as needed.        . Multiple Vitamin (MULTIVITAMIN) tablet Take 1 tablet by mouth daily.        Marland Kitchen olmesartan (BENICAR) 20 MG tablet Take 20 mg by mouth daily. One half tablet daily       . potassium chloride SA (K-DUR,KLOR-CON) 20 MEQ tablet Take 20 mEq by mouth daily.        . Probiotic Product (ALIGN) 4 MG CAPS Take 1 capsule by mouth daily.  14 capsule  0  . RABEprazole (ACIPHEX) 20 MG tablet Take 1 tablet (20 mg total) by mouth daily.  30 tablet  1  . rifaximin (XIFAXAN) 550 MG TABS Take 1 tablet (550 mg total) by mouth 2 (two) times daily.  20 tablet  0  . rosuvastatin (CRESTOR) 10 MG tablet Take 10 mg by mouth daily. One tablet every other day          Allergies  Allergen Reactions  . Duloxetine     REACTION: agitaded- weight gain  . Oxycodone-Acetaminophen     Past Medical History  Diagnosis Date  . Obesity   . Circadian rhythm sleep disorder   . CTS (carpal tunnel syndrome)   . Diarrhea   . IBS (irritable bowel syndrome)   . Diverticulitis of colon   . Gastritis   . Esophageal stricture   . Personal history of colonic polyps 10/25/2011 & 12/02/11    not retrieved Dr Victorino Dike & tubular adenomas  . Hiatal hernia   . GERD (gastroesophageal reflux disease)   . Obstructive sleep apnea   . Insomnia   . HTN (hypertension)   . Hyperlipidemia   . Depression     Past Surgical History  Procedure Date  . Carpal tunnel release   . Cholecystectomy 2003  . Total knee arthroplasty     bilateral    Family History  Problem Relation Age of Onset  . Coronary artery disease Father   . Peripheral vascular disease Father   .  Coronary artery disease Mother   . Coronary artery disease Brother   . Peripheral vascular disease Brother   . Emphysema Brother   . Colon cancer Sister   . Emphysema Sister   . Sleep apnea Son   . Colon cancer Sister     spread to her brain    History   Social History  . Marital Status: Widowed    Spouse Name: N/A    Number of Children: 2  . Years of Education: N/A   Occupational History  . librarian- retired     retired   Social History Main Topics  . Smoking status: Never Smoker   . Smokeless tobacco: Never Used  . Alcohol Use: No  . Drug Use: No  . Sexually Active: Not on file   Other Topics Concern  . Not on file   Social History Narrative  . No narrative on file    ROS: Please see the HPI.  All other systems reviewed and negative.  PHYSICAL EXAM:  BP 123/69  Pulse 79  Resp 18  Ht 5\' 6"  (1.676 m)  Wt 210 lb (95.255 kg)  BMI 33.89 kg/m2  General: Well developed, well nourished, in no acute distress. Eyes: Subconjunctival hemorrhage.  No vision changes.   Head:   Normocephalic and atraumatic. Neck: no JVD Lungs: Clear to auscultation and percussion. Heart: Normal S1 and S2.  Minimal SEM.  No DM.   Pulses: Pulses normal in all 4 extremities. Extremities: No clubbing or cyanosis. No edema. Neurologic: Alert and oriented x 3.  EKG:  NSR.  WNL.    ASSESSMENT AND PLAN:

## 2012-05-04 NOTE — Assessment & Plan Note (Signed)
This is likely severe.  Unfortunately, she and her daughter maintain that she is unable to use any of the the equipment.

## 2012-05-04 NOTE — Assessment & Plan Note (Signed)
Being followed by Dr. Christell Constant.

## 2012-05-04 NOTE — Assessment & Plan Note (Addendum)
See my notes from 2009.  The patient has been on plavix, but no cardiac symptoms at present.  I explained to her that from a cardiac standpoint, there is not a clear indication to continue plavix at present.  She could take either baby ASA or spread it out to three times per week.  Risk of bleeding with plavix would be increased.  Discussed medication options at length.  If she were to use ASA, she would need to be careful about use of ASA.

## 2012-05-09 ENCOUNTER — Other Ambulatory Visit: Payer: Self-pay | Admitting: Neurology

## 2012-05-09 DIAGNOSIS — M545 Low back pain, unspecified: Secondary | ICD-10-CM

## 2012-05-09 DIAGNOSIS — R251 Tremor, unspecified: Secondary | ICD-10-CM

## 2012-05-09 DIAGNOSIS — I679 Cerebrovascular disease, unspecified: Secondary | ICD-10-CM

## 2012-05-09 DIAGNOSIS — R413 Other amnesia: Secondary | ICD-10-CM

## 2012-05-09 DIAGNOSIS — R269 Unspecified abnormalities of gait and mobility: Secondary | ICD-10-CM

## 2012-05-12 ENCOUNTER — Other Ambulatory Visit: Payer: Medicare Other

## 2012-05-13 ENCOUNTER — Other Ambulatory Visit: Payer: Self-pay | Admitting: Neurology

## 2012-05-14 LAB — BUN: BUN: 19 mg/dL (ref 6–23)

## 2012-05-14 LAB — CREATININE, SERUM: Creat: 1.15 mg/dL — ABNORMAL HIGH (ref 0.50–1.10)

## 2012-05-17 ENCOUNTER — Ambulatory Visit
Admission: RE | Admit: 2012-05-17 | Discharge: 2012-05-17 | Disposition: A | Discharge status: Home / Self Care | Payer: Medicare Other | Source: Ambulatory Visit | Attending: Neurology | Admitting: Neurology

## 2012-05-17 DIAGNOSIS — M545 Low back pain, unspecified: Secondary | ICD-10-CM

## 2012-05-17 DIAGNOSIS — R251 Tremor, unspecified: Secondary | ICD-10-CM

## 2012-05-17 DIAGNOSIS — I679 Cerebrovascular disease, unspecified: Secondary | ICD-10-CM

## 2012-05-17 DIAGNOSIS — R269 Unspecified abnormalities of gait and mobility: Secondary | ICD-10-CM

## 2012-05-17 DIAGNOSIS — R413 Other amnesia: Secondary | ICD-10-CM

## 2012-05-17 MED ORDER — GADOBENATE DIMEGLUMINE 529 MG/ML IV SOLN
20.0000 mL | Freq: Once | INTRAVENOUS | Status: AC | PRN
  Administered 2012-05-17: 20 mL via INTRAVENOUS

## 2012-05-18 ENCOUNTER — Other Ambulatory Visit: Payer: Self-pay | Admitting: *Deleted

## 2012-05-18 MED ORDER — DIPHENOXYLATE-ATROPINE 2.5-0.025 MG PO TABS: 1.0000 | ORAL_TABLET | Freq: Four times a day (QID) | ORAL | Status: DC | PRN

## 2012-05-21 ENCOUNTER — Emergency Department (HOSPITAL_COMMUNITY): Payer: Medicare Other

## 2012-05-21 ENCOUNTER — Encounter (HOSPITAL_COMMUNITY): Payer: Self-pay | Admitting: *Deleted

## 2012-05-21 ENCOUNTER — Emergency Department (HOSPITAL_COMMUNITY)
Admission: EM | Admit: 2012-05-21 | Discharge: 2012-05-21 | Disposition: A | Discharge status: Home / Self Care | Payer: Medicare Other | Attending: Emergency Medicine | Admitting: Emergency Medicine

## 2012-05-21 DIAGNOSIS — I10 Essential (primary) hypertension: Secondary | ICD-10-CM | POA: Insufficient documentation

## 2012-05-21 DIAGNOSIS — S0990XA Unspecified injury of head, initial encounter: Secondary | ICD-10-CM

## 2012-05-21 DIAGNOSIS — F3289 Other specified depressive episodes: Secondary | ICD-10-CM | POA: Insufficient documentation

## 2012-05-21 DIAGNOSIS — R51 Headache: Secondary | ICD-10-CM | POA: Insufficient documentation

## 2012-05-21 DIAGNOSIS — E785 Hyperlipidemia, unspecified: Secondary | ICD-10-CM | POA: Insufficient documentation

## 2012-05-21 DIAGNOSIS — W108XXA Fall (on) (from) other stairs and steps, initial encounter: Secondary | ICD-10-CM | POA: Insufficient documentation

## 2012-05-21 DIAGNOSIS — S32509A Unspecified fracture of unspecified pubis, initial encounter for closed fracture: Secondary | ICD-10-CM | POA: Insufficient documentation

## 2012-05-21 DIAGNOSIS — M25559 Pain in unspecified hip: Secondary | ICD-10-CM | POA: Insufficient documentation

## 2012-05-21 DIAGNOSIS — Y92009 Unspecified place in unspecified non-institutional (private) residence as the place of occurrence of the external cause: Secondary | ICD-10-CM | POA: Insufficient documentation

## 2012-05-21 DIAGNOSIS — S0003XA Contusion of scalp, initial encounter: Secondary | ICD-10-CM | POA: Insufficient documentation

## 2012-05-21 DIAGNOSIS — S32599A Other specified fracture of unspecified pubis, initial encounter for closed fracture: Secondary | ICD-10-CM

## 2012-05-21 DIAGNOSIS — F329 Major depressive disorder, single episode, unspecified: Secondary | ICD-10-CM | POA: Insufficient documentation

## 2012-05-21 DIAGNOSIS — Z96659 Presence of unspecified artificial knee joint: Secondary | ICD-10-CM | POA: Insufficient documentation

## 2012-05-21 MED ORDER — HYDROCODONE-ACETAMINOPHEN 5-500 MG PO TABS: 1.0000 | ORAL_TABLET | Freq: Four times a day (QID) | ORAL | Status: AC | PRN

## 2012-05-21 NOTE — ED Provider Notes (Addendum)
History    71 year old female presenting with left groin pain after fall. Mechanical in nature. Patient was walking down a single step her house into the garage when she realized she forgot something. She turned around to return inside she lost her balance and fell. She fell in her left side. She did check her head. No loss of consciousness. Denies any significant headache. Denies use of blood thinning medication. Pain in left groin which is worse with movement and ambulation. All skin tear to left elbow but no significant pain there. No confusion per family.  CSN: [id number]  Arrival date & time 05/21/12  1152   First MD Initiated Contact with Patient 05/21/12 1304      Chief Complaint  Patient presents with  . Groin Pain    left  . Headache    (Consider location/radiation/quality/duration/timing/severity/associated sxs/prior treatment) HPI  Past Medical History  Diagnosis Date  . Obesity   . Circadian rhythm sleep disorder   . CTS (carpal tunnel syndrome)   . Diarrhea   . IBS (irritable bowel syndrome)   . Diverticulitis of colon   . Gastritis   . Esophageal stricture   . Personal history of colonic polyps 10/25/2011 & 12/02/11    not retrieved Dr Victorino Dike & tubular adenomas  . Hiatal hernia   . GERD (gastroesophageal reflux disease)   . Obstructive sleep apnea   . Insomnia   . HTN (hypertension)   . Hyperlipidemia   . Depression     Past Surgical History  Procedure Date  . Carpal tunnel release   . Cholecystectomy 2003  . Total knee arthroplasty     bilateral    Family History  Problem Relation Age of Onset  . Coronary artery disease Father   . Peripheral vascular disease Father   . Coronary artery disease Mother   . Coronary artery disease Brother   . Peripheral vascular disease Brother   . Emphysema Brother   . Colon cancer Sister   . Emphysema Sister   . Sleep apnea Son   . Colon cancer Sister     spread to her brain    History  Substance Use  Topics  . Smoking status: Never Smoker   . Smokeless tobacco: Never Used  . Alcohol Use: No    OB History    Grav Para Term Preterm Abortions TAB SAB Ect Mult Living                  Review of Systems   Review of symptoms negative unless otherwise noted in HPI.   Allergies  Morphine and related; Duloxetine; and Oxycodone-acetaminophen  Home Medications   Current Outpatient Rx  Name Route Sig Dispense Refill  . AMLODIPINE BESYLATE 2.5 MG PO TABS Oral Take 2.5 mg by mouth daily.      . AZITHROMYCIN 250 MG PO TABS Oral Take 250-500 mg by mouth daily. Started on 05/18/12; 5 day course of therapy    . COLESEVELAM HCL 625 MG PO TABS Oral Take 1,875 mg by mouth 2 (two) times daily with a meal.      . DIPHENOXYLATE-ATROPINE 2.5-0.025 MG PO TABS Oral Take 1 tablet by mouth 4 (four) times daily as needed for diarrhea or loose stools. 30 tablet 1  . ESOMEPRAZOLE MAGNESIUM 40 MG PO CPDR Oral Take 40 mg by mouth daily before breakfast.    . FENTANYL 50 MCG/HR TD PT72 Transdermal Place 1 patch onto the skin every 3 (three) days.      Marland Kitchen  FUROSEMIDE 40 MG PO TABS Oral Take 40 mg by mouth daily.     Marland Kitchen HYDROCODONE-ACETAMINOPHEN 10-325 MG PO TABS Oral Take 1 tablet by mouth every 6 (six) hours as needed. For pain.    Marland Kitchen LORATADINE 10 MG PO TABS Oral Take 10 mg by mouth daily. 5 day course of therapy, paired with zithromax.  Started 05/18/12    . ONE-DAILY MULTI VITAMINS PO TABS Oral Take 1 tablet by mouth daily.      Marland Kitchen OLMESARTAN MEDOXOMIL 20 MG PO TABS Oral Take 10 mg by mouth daily.     Marland Kitchen POTASSIUM CHLORIDE CRYS ER 20 MEQ PO TBCR Oral Take 20 mEq by mouth daily.      Marland Kitchen ROSUVASTATIN CALCIUM 10 MG PO TABS Oral Take 10-20 mg by mouth See admin instructions. Alternating 10mg  and 20mg  daily.      BP 118/56  Pulse 75  Temp(Src) 98.2 F (36.8 C) (Oral)  Resp 18  SpO2 94%  Physical Exam  Nursing note and vitals reviewed. Constitutional: She appears well-developed and well-nourished. No distress.    HENT:  Head: Normocephalic.       Left temporoparietal hematoma. Significant bony tenderness.  What appears to be sebaceous cyst right frontal region.  Eyes: Conjunctivae are normal. Right eye exhibits no discharge. Left eye exhibits no discharge.  Neck: Neck supple.  Cardiovascular: Normal rate, regular rhythm and normal heart sounds.  Exam reveals no gallop and no friction rub.   No murmur heard. Pulmonary/Chest: Effort normal and breath sounds normal. No respiratory distress.  Abdominal: Soft. She exhibits no distension. There is no tenderness.  Musculoskeletal: She exhibits no edema and no tenderness.       TNo midline tenderness. Pelvis is stable. Patient has severe pain with range of motion of her left hip, particularly flexion. LLE with no shortening or malrotation.  Neurological: She is alert.  Skin: Skin is warm and dry.       All skin tear to left elbow with minimal swelling ecchymosis. No active bleeding. The stomach and bony tenderness. No significant increase in pain with range of motion of the elbow. Neurovascular intact distally.  Psychiatric: She has a normal mood and affect. Her behavior is normal. Thought content normal.    ED Course  Procedures (including critical care time)  Labs Reviewed - No data to display Dg Hip Complete Left  05/21/2012  *RADIOLOGY REPORT*  Clinical Data: Fall, groin pain.  Left hip pain.  LEFT HIP - COMPLETE 2+ VIEW  Comparison: None  Findings: Mild degenerative changes in the hips bilaterally.  SI joints are symmetric and unremarkable. No acute bony abnormality. Specifically, no fracture, subluxation, or dislocation.  Soft tissues are intact.  IMPRESSION: No acute bony abnormality.  Original Report Authenticated By: Cyndie Chime, M.D.   Ct Head Wo Contrast  05/21/2012  *RADIOLOGY REPORT*  Clinical Data: Status post fall.  Left temporo-parietal hematoma and headache.  CT HEAD WITHOUT CONTRAST  Technique:  Contiguous axial images were obtained from  the base of the skull through the vertex without contrast.  Comparison: 05/17/2012  Findings: Mild nonspecific white matter abnormality is noted which may be due to chronic microvascular disease.  The brain has an otherwise normal appearance without evidence for hemorrhage, infarction, hydrocephalus, or mass lesion.  There is no extra axial fluid collection. There are multiple subcutaneous calcified nodules within the scalp.  These may represent calcified sebaceous cysts. The skull appears intact.  IMPRESSION:  1.  No acute intracranial  abnormalities.  Original Report Authenticated By: Rosealee Albee, M.D.   Ct Hip Left Wo Contrast  05/21/2012  *RADIOLOGY REPORT*  Clinical Data: Fall, left groin pain.  CT OF THE LEFT HIP WITHOUT CONTRAST  Technique:  Multidetector CT imaging was performed according to the standard protocol. Multiplanar CT image reconstructions were also generated.  Comparison: Plain films earlier today.  Findings: There is a nondisplaced left inferior pubic ramus fracture.  No superior pubic ramus fracture.  No femoral neck fracture.  Fracture is best seen on the axial imaging, image 34 of series 3.  No additional acute bony abnormality.  IMPRESSION: Nondisplaced left inferior pubic ramus fracture.  Original Report Authenticated By: Cyndie Chime, M.D.     1. Inferior pubic ramus fracture       MDM  70y female with left hip pain after fall. CT scan of her hip is significant for a nondisplaced left inferior pubic ramus fracture. This is a stable injury. Weightbearing as tolerated. Pain medication as needed. Orthopedic followup as needed. Pt has walker she can use for assistance. Nonfocal neurological exam. CT head neg for emrgent acute intracranial injury or fracture.        Raeford Razor, MD 05/21/12 1455  Raeford Razor, MD 05/21/12 1455

## 2012-05-21 NOTE — ED Notes (Signed)
Pt from home with reports of falling onto concrete after losing balance and hitting left side of head, left elbow (small skin tear noted) and left hip area. Pt denies LOC or taking blood thinners and reports headache and left groin pain.

## 2012-05-21 NOTE — Discharge Instructions (Signed)
Pelvic Fracture, Adult You have a fracture (this means there is a break in the bones) of the pelvis. The pelvis is the ring of bones that make up your hipbones. These are the bones you sit on and the lower part of the spine. It is like a boney ring where your legs attach and which supports your upper body. You have an un-displaced fracture. This means the bones are in good position. The pelvic fracture you have is a simple (uncomplicated) fracture. DIAGNOSIS  X-rays usually diagnose these fractures. TREATMENT  The goals of treating pelvic fractures are to get the bones to heal in a good position. The patient should return to normal activities as soon as possible. Such fractures are often treated with normal bed rest and conservative measures.  HOME CARE INSTRUCTIONS   You should be at bed rest for as long as directed by your caregiver. Following this, you may do usual activities, but avoid strenuous activities for as long as directed by your caregiver.   Only take over-the-counter or prescription medicines for pain, discomfort, or fever as directed by your caregiver.   Bed-rest may also be used for discomfort.   Resume your activities when you are able.   If you develop increased pain or discomfort not relieved with medications, contact your caregiver.   Warning: Do not drive a car or operate a motor vehicle until your caregiver specifically tells you it is safe to do so.  SEEK IMMEDIATE MEDICAL CARE IF:   You feel light-headed or faint, develop chest pain or shortness of breath.   An unexplained oral temperature above 102 F (38.9 C) develops.   You develop blood in the urine or in the stools.   There is difficulty urinating, and/or having a bowel movement, or pain with these efforts.   There is a difficulty or increased pain with walking.   There is swelling in one or both legs that is not normal.  Document Released: 02/15/2002 Document Revised: 11/26/2011 Document Reviewed:  07/20/2008 [employer] Patient Information 2012 [employer]. 

## 2012-06-13 ENCOUNTER — Encounter: Payer: Self-pay | Admitting: Cardiology

## 2012-06-16 ENCOUNTER — Encounter: Payer: Self-pay | Admitting: Cardiology

## 2012-09-20 ENCOUNTER — Encounter: Payer: Self-pay | Admitting: Cardiology

## 2012-10-13 DIAGNOSIS — R32 Unspecified urinary incontinence: Secondary | ICD-10-CM | POA: Insufficient documentation

## 2012-10-13 HISTORY — DX: Unspecified urinary incontinence: R32

## 2012-10-31 ENCOUNTER — Encounter: Payer: Self-pay | Admitting: Cardiology

## 2012-10-31 ENCOUNTER — Ambulatory Visit (INDEPENDENT_AMBULATORY_CARE_PROVIDER_SITE_OTHER): Payer: Medicare Other | Admitting: Cardiology

## 2012-10-31 VITALS — BP 112/66 | HR 71 | Ht 66.0 in | Wt 205.0 lb

## 2012-10-31 DIAGNOSIS — I2510 Atherosclerotic heart disease of native coronary artery without angina pectoris: Secondary | ICD-10-CM

## 2012-10-31 DIAGNOSIS — I10 Essential (primary) hypertension: Secondary | ICD-10-CM

## 2012-10-31 DIAGNOSIS — E7800 Pure hypercholesterolemia, unspecified: Secondary | ICD-10-CM

## 2012-10-31 NOTE — Assessment & Plan Note (Signed)
Mainly non obstructive plaque treated medically.  No change.

## 2012-10-31 NOTE — Patient Instructions (Addendum)
Your physician recommends that you schedule a follow-up appointment in: MARCH 2014  Your physician recommends that you continue on your current medications as directed. Please refer to the Current Medication list given to you today.  

## 2012-10-31 NOTE — Progress Notes (Signed)
HPI:  Patient seen today in a followup visit. From a cardiac standpoint she is stable. She's not been having chest pain. Her major problems are related to her back. She also has some diarrhea. Both her shoulders and arms on the right bother her as well. She is fairly limited in terms of her activity from all of this. She has a trigger finger that they talked about operating.  The patient has a paraesophageal hernia that is unrepaired.    Current Outpatient Prescriptions  Medication Sig Dispense Refill  . amLODipine (NORVASC) 2.5 MG tablet Take 2.5 mg by mouth daily.        . colesevelam (WELCHOL) 625 MG tablet Take 1,875 mg by mouth 2 (two) times daily with a meal.        . diphenoxylate-atropine (LOMOTIL) 2.5-0.025 MG per tablet Take 1 tablet by mouth 4 (four) times daily as needed for diarrhea or loose stools.  30 tablet  1  . esomeprazole (NEXIUM) 40 MG capsule Take 40 mg by mouth daily before breakfast.      . fentaNYL (DURAGESIC - DOSED MCG/HR) 50 MCG/HR Place 1 patch onto the skin every 3 (three) days.        . furosemide (LASIX) 40 MG tablet Take 40 mg by mouth daily.       Marland Kitchen HYDROcodone-acetaminophen (NORCO) 10-325 MG per tablet Take 1 tablet by mouth every 6 (six) hours as needed. For pain.      . Multiple Vitamin (MULTIVITAMIN) tablet Take 1 tablet by mouth daily.        Marland Kitchen olmesartan (BENICAR) 20 MG tablet Take 10 mg by mouth daily.       . potassium chloride SA (K-DUR,KLOR-CON) 20 MEQ tablet Take 20 mEq by mouth daily.        . rosuvastatin (CRESTOR) 10 MG tablet Take 10-20 mg by mouth See admin instructions. Alternating 10mg  and 20mg  daily.        Allergies  Allergen Reactions  . Morphine And Related Itching  . Duloxetine     REACTION: agitaded- weight gain  . Oxycodone-Acetaminophen     Past Medical History  Diagnosis Date  . Obesity   . Circadian rhythm sleep disorder   . CTS (carpal tunnel syndrome)   . Diarrhea   . IBS (irritable bowel syndrome)   . Diverticulitis  of colon   . Gastritis   . Esophageal stricture   . Personal history of colonic polyps 10/25/2011 & 12/02/11    not retrieved Dr Victorino Dike & tubular adenomas  . Hiatal hernia   . GERD (gastroesophageal reflux disease)   . Obstructive sleep apnea   . Insomnia   . HTN (hypertension)   . Hyperlipidemia   . Depression     Past Surgical History  Procedure Date  . Carpal tunnel release   . Cholecystectomy 2003  . Total knee arthroplasty     bilateral    Family History  Problem Relation Age of Onset  . Coronary artery disease Father   . Peripheral vascular disease Father   . Coronary artery disease Mother   . Coronary artery disease Brother   . Peripheral vascular disease Brother   . Emphysema Brother   . Colon cancer Sister   . Emphysema Sister   . Sleep apnea Son   . Colon cancer Sister     spread to her brain    History   Social History  . Marital Status: Widowed    Spouse Name: N/A  Number of Children: 2  . Years of Education: N/A   Occupational History  . librarian- retired     retired   Social History Main Topics  . Smoking status: Never Smoker   . Smokeless tobacco: Never Used  . Alcohol Use: No  . Drug Use: No  . Sexually Active: Not on file   Other Topics Concern  . Not on file   Social History Narrative  . No narrative on file    ROS: Please see the HPI.  All other systems reviewed and negative.  PHYSICAL EXAM:  BP 112/66  Pulse 71  Ht 5\' 6"  (1.676 m)  Wt 205 lb (92.987 kg)  BMI 33.09 kg/m2  General: Well developed, well nourished, in no acute distress. Head:  Normocephalic and atraumatic. Neck: no JVD Lungs: Clear to auscultation and percussion.  Minimal expiratory wheeze.   Heart: Normal S1 and S2.  No murmur, rubs or gallops.  Pulses: Pulses normal in all 4 extremities. Extremities: No clubbing or cyanosis. No edema. Neurologic: Alert and oriented x 3.  EKG:  NSR with sinus arrythmia.    ASSESSMENT AND PLAN:

## 2012-10-31 NOTE — Assessment & Plan Note (Signed)
Controlled at present.  

## 2012-10-31 NOTE — Assessment & Plan Note (Signed)
Managed by Dr. Christell Constant.

## 2013-03-02 ENCOUNTER — Ambulatory Visit (INDEPENDENT_AMBULATORY_CARE_PROVIDER_SITE_OTHER): Payer: Medicare Other | Admitting: Cardiology

## 2013-03-02 ENCOUNTER — Encounter: Payer: Self-pay | Admitting: Cardiology

## 2013-03-02 VITALS — BP 125/70 | HR 66 | Ht 66.0 in | Wt 204.8 lb

## 2013-03-02 DIAGNOSIS — I10 Essential (primary) hypertension: Secondary | ICD-10-CM

## 2013-03-02 DIAGNOSIS — I2510 Atherosclerotic heart disease of native coronary artery without angina pectoris: Secondary | ICD-10-CM

## 2013-03-02 DIAGNOSIS — E785 Hyperlipidemia, unspecified: Secondary | ICD-10-CM

## 2013-03-02 NOTE — Progress Notes (Signed)
HPI:  This very nice lady who I have known for many years is in for followup. She is continuing to have problems in her low back, and she is seen another surgeon for possible surgery. She is being sent for second opinion. She was somewhat tearful today, and feels like she needs to get something done but is afraid. She denies any chest pain, but is been somewhat limited over the past year or 2 because of back problems. She denies any specific ongoing cardiac problems.  Current Outpatient Prescriptions  Medication Sig Dispense Refill  . amLODipine (NORVASC) 2.5 MG tablet Take 2.5 mg by mouth daily.        . colesevelam (WELCHOL) 625 MG tablet Take 1,875 mg by mouth 2 (two) times daily with a meal.        . diphenoxylate-atropine (LOMOTIL) 2.5-0.025 MG per tablet Take 1 tablet by mouth 4 (four) times daily as needed for diarrhea or loose stools.  30 tablet  1  . esomeprazole (NEXIUM) 40 MG capsule Take 40 mg by mouth daily before breakfast.      . fentaNYL (DURAGESIC - DOSED MCG/HR) 50 MCG/HR Place 1 patch onto the skin every 3 (three) days.        . furosemide (LASIX) 40 MG tablet Take 40 mg by mouth daily.       Marland Kitchen HYDROcodone-acetaminophen (NORCO) 10-325 MG per tablet Take 1 tablet by mouth every 6 (six) hours as needed. For pain.      . Multiple Vitamin (MULTIVITAMIN) tablet Take 1 tablet by mouth daily.        Marland Kitchen olmesartan (BENICAR) 20 MG tablet Take 10 mg by mouth daily.       . potassium chloride SA (K-DUR,KLOR-CON) 20 MEQ tablet Take 20 mEq by mouth daily.        . rosuvastatin (CRESTOR) 10 MG tablet Take 10-20 mg by mouth See admin instructions. Alternating 10mg  and 20mg  daily.       No current facility-administered medications for this visit.    Allergies  Allergen Reactions  . Morphine And Related Itching  . Duloxetine     REACTION: agitaded- weight gain  . Oxycodone-Acetaminophen     Past Medical History  Diagnosis Date  . Obesity   . Circadian rhythm sleep disorder   . CTS  (carpal tunnel syndrome)   . Diarrhea   . IBS (irritable bowel syndrome)   . Diverticulitis of colon   . Gastritis   . Esophageal stricture   . Personal history of colonic polyps 10/25/2011 & 12/02/11    not retrieved Dr Victorino Dike & tubular adenomas  . Hiatal hernia   . GERD (gastroesophageal reflux disease)   . Obstructive sleep apnea   . Insomnia   . HTN (hypertension)   . Hyperlipidemia   . Depression     Past Surgical History  Procedure Laterality Date  . Carpal tunnel release    . Cholecystectomy  2003  . Total knee arthroplasty      bilateral    Family History  Problem Relation Age of Onset  . Coronary artery disease Father   . Peripheral vascular disease Father   . Coronary artery disease Mother   . Coronary artery disease Brother   . Peripheral vascular disease Brother   . Emphysema Brother   . Colon cancer Sister   . Emphysema Sister   . Sleep apnea Son   . Colon cancer Sister     spread to her brain  History   Social History  . Marital Status: Widowed    Spouse Name: N/A    Number of Children: 2  . Years of Education: N/A   Occupational History  . librarian- retired     retired   Social History Main Topics  . Smoking status: Never Smoker   . Smokeless tobacco: Never Used  . Alcohol Use: No  . Drug Use: No  . Sexually Active: Not on file   Other Topics Concern  . Not on file   Social History Narrative  . No narrative on file    ROS: Please see the HPI.  All other systems reviewed and negative.  PHYSICAL EXAM:  BP 125/70  Pulse 66  Ht 5\' 6"  (1.676 m)  Wt 204 lb 12.8 oz (92.897 kg)  BMI 33.07 kg/m2  SpO2 95%  General: Well developed, well nourished, in no acute distress. Head:  Normocephalic and atraumatic. Neck: no JVD Lungs: Clear to auscultation and percussion. Heart: Normal S1 and S2.  No murmur, rubs or gallops.  Pulses: Pulses normal in all 4 extremities. Extremities: No clubbing or cyanosis. No edema. Neurologic:  Alert and oriented x 3.  EKG:  NSR. WNL.    CATH 2009 ANGIOGRAPHIC DATA:  1. Ventriculography done in the RAO projection reveals vigorous global  systolic function without segmental wall motion abnormality.  2. The coronaries are generally calcified, similar to the previous  study with some perhaps advancement and calcification in the LAD.  3. The left main is free of critical disease.  4. The LAD has some diffuse mild plaquing of approximately 30% in the  mid vessel. The first diagonal also has perhaps 30-40% narrowing.  There is some moderate calcification near the LAD diagonal  bifurcation. The remainder of the vessel has luminal irregularity,  but no significant focal obstruction.  5. The circumflex demonstrates an ostial 50% stenosis. The lumen does  not appear to be significantly compromised, and comparing shot per  shot with the previous angiographic study does not appear to  demonstrate significant progression. Mild progression can  certainly not be excluded. The remainder of the circumflex system  is without critical narrowing.  6. The right coronary artery is a large-caliber vessel with a smaller  bifurcating posterior descending and a larger posterolateral  branch. The mid vessel demonstrates a fairly long segmental area  of plaquing that would measure 30-40% luminal reduction diffusely.  CONCLUSIONS:  1. Well-preserved left ventricular function.  2. Calcification of the coronary arteries.  3. Nonobstructive coronary plaque.  DISPOSITION: We will continue to evaluate the patient. We did do a  blood gas today; the pO2 was 65 and pH was normal. Oxygen saturation is  92%. D-dimer is being obtained. We may consider CT scanning of the  chest and continued followup with Pulmonary Medicine. The patient has  severe sleep apnea.  Arturo Morton. Riley Kill, MD, [facility]  Electronically Signed  TDS/MEDQ D: 04/25/2008 T: 04/26/2008 Job: [id number]     ASSESSMENT AND PLAN:  1.  Continue  medical therapy 2.  She will need to be evaluated prior to any surgery. 3.  FU with Dr. Shirlee Latch  (Dr. Christell Constant is her primary)

## 2013-03-02 NOTE — Patient Instructions (Addendum)
Your physician wants you to follow-up in: 6 MONTHS with Dr McLean (previous pt of Dr Stuckey). You will receive a reminder letter in the mail two months in advance. If you don't receive a letter, please call our office to schedule the follow-up appointment.  Your physician recommends that you continue on your current medications as directed. Please refer to the Current Medication list given to you today.  

## 2013-03-04 NOTE — Assessment & Plan Note (Signed)
BP is well controlled 

## 2013-03-04 NOTE — Assessment & Plan Note (Signed)
Dr. Christell Constant manages her lipid status.

## 2013-03-04 NOTE — Assessment & Plan Note (Signed)
No current cardiac symptoms.  She has been fine off of clopidogrel.

## 2013-04-13 ENCOUNTER — Ambulatory Visit (INDEPENDENT_AMBULATORY_CARE_PROVIDER_SITE_OTHER): Payer: Medicare Other | Admitting: Family Medicine

## 2013-04-13 ENCOUNTER — Encounter: Payer: Self-pay | Admitting: Family Medicine

## 2013-04-13 VITALS — BP 139/76 | HR 78 | Temp 99.5°F | Ht 66.0 in | Wt 204.2 lb

## 2013-04-13 DIAGNOSIS — E559 Vitamin D deficiency, unspecified: Secondary | ICD-10-CM

## 2013-04-13 DIAGNOSIS — R5383 Other fatigue: Secondary | ICD-10-CM

## 2013-04-13 DIAGNOSIS — E785 Hyperlipidemia, unspecified: Secondary | ICD-10-CM

## 2013-04-13 DIAGNOSIS — F4323 Adjustment disorder with mixed anxiety and depressed mood: Secondary | ICD-10-CM

## 2013-04-13 DIAGNOSIS — R5381 Other malaise: Secondary | ICD-10-CM

## 2013-04-13 LAB — POCT CBC
Granulocyte percent: 67.1 %G (ref 37–80)
HCT, POC: 38.7 % (ref 37.7–47.9)
Hemoglobin: 13.4 g/dL (ref 12.2–16.2)
MCHC: 34.5 g/dL (ref 31.8–35.4)
MCV: 91.4 fL (ref 80–97)
RDW, POC: 12.9 %
WBC: 7.5 10*3/uL (ref 4.6–10.2)

## 2013-04-13 LAB — THYROID PANEL WITH TSH
T3 Uptake: 38.5 % — ABNORMAL HIGH (ref 22.5–37.0)
T4, Total: 8.1 ug/dL (ref 5.0–12.5)
TSH: 1.603 u[IU]/mL (ref 0.350–4.500)

## 2013-04-13 LAB — HEPATIC FUNCTION PANEL
Alkaline Phosphatase: 67 U/L (ref 39–117)
Bilirubin, Direct: 0.1 mg/dL (ref 0.0–0.3)
Indirect Bilirubin: 0.4 mg/dL (ref 0.0–0.9)
Total Bilirubin: 0.5 mg/dL (ref 0.3–1.2)
Total Protein: 6.3 g/dL (ref 6.0–8.3)

## 2013-04-13 MED ORDER — DIPHENOXYLATE-ATROPINE 2.5-0.025 MG PO TABS: 1.0000 | ORAL_TABLET | Freq: Four times a day (QID) | ORAL | Status: AC | PRN

## 2013-04-13 MED ORDER — FENTANYL 50 MCG/HR TD PT72: 1.0000 | MEDICATED_PATCH | TRANSDERMAL | Status: DC

## 2013-04-13 MED ORDER — LORAZEPAM 0.5 MG PO TABS: 0.5000 mg | ORAL_TABLET | Freq: Two times a day (BID) | ORAL | Status: DC | PRN

## 2013-04-13 MED ORDER — OLMESARTAN MEDOXOMIL 20 MG PO TABS: 10.0000 mg | ORAL_TABLET | Freq: Every day | ORAL | Status: DC

## 2013-04-13 MED ORDER — AMLODIPINE BESYLATE 2.5 MG PO TABS: 2.5000 mg | ORAL_TABLET | Freq: Every day | ORAL | Status: DC

## 2013-04-13 MED ORDER — HYDROCODONE-ACETAMINOPHEN 10-325 MG PO TABS: 1.0000 | ORAL_TABLET | Freq: Four times a day (QID) | ORAL | Status: DC | PRN

## 2013-04-13 MED ORDER — COLESEVELAM HCL 625 MG PO TABS: 1875.0000 mg | ORAL_TABLET | Freq: Two times a day (BID) | ORAL | Status: DC

## 2013-04-13 MED ORDER — ESOMEPRAZOLE MAGNESIUM 40 MG PO CPDR: 40.0000 mg | DELAYED_RELEASE_CAPSULE | Freq: Every day | ORAL | Status: DC

## 2013-04-13 MED ORDER — ROSUVASTATIN CALCIUM 10 MG PO TABS: 10.0000 mg | ORAL_TABLET | ORAL | Status: DC

## 2013-04-13 MED ORDER — FUROSEMIDE 40 MG PO TABS: 40.0000 mg | ORAL_TABLET | Freq: Every day | ORAL | Status: DC

## 2013-04-13 NOTE — Progress Notes (Signed)
  Subjective:    Patient ID: [name] [name], female    DOB: 1941/09/05, 72 y.o.   MRN: [id number]  HPI  Patient continues to have trouble with her back. She is seeing a Dr. Lorenso Courier at [facility] and contemplating surgery.   Review of Systems  Constitutional: Positive for fatigue.  HENT: Positive for postnasal drip.   Eyes: Positive for itching (relieved by taking Allegra).  Respiratory: Positive for shortness of breath (with exertion).   Cardiovascular: Negative.   Gastrointestinal: Positive for abdominal pain and constipation (intermitent). Diarrhea: intermitent.  Genitourinary: Negative.   Musculoskeletal: Positive for back pain (arthritis) and arthralgias (bilateral knees).  Allergic/Immunologic: Positive for environmental allergies.  Neurological: Positive for dizziness (ocasional), tremors, weakness and headaches (occasional).  Psychiatric/Behavioral: The patient is nervous/anxious (constant).        Objective:   Physical Exam BP 139/76  Pulse 78  Temp(Src) 99.5 F (37.5 C) (Oral)  Ht 5\' 6"  (1.676 m)  Wt 204 lb 3.2 oz (92.625 kg)  BMI 32.97 kg/m2  The patient appeared well nourished and normally developed, alert and oriented to time and place. Speech, behavior and judgement appear normal except that she was quite anxious today and tearful regarding her impending back surgery and back pain Vital signs as documented.  Head exam is unremarkable. No scleral icterus or pallor noted. Nose ears and throat were normal. Neck is without jugular venous distension, thyromegally, or carotid bruits. Carotid upstrokes are brisk bilaterally. No cervical adenopathy. Lungs are clear anteriorly and posteriorly to auscultation. Normal respiratory effort. Cardiac exam reveals regular rate and rhythm. First and second heart sounds normal. No murmurs, rubs or gallops.  Abdominal exam reveals normal bowl sounds, no masses, no organomegaly and no aortic enlargement. There is abdominal  obesity. No inguinal adenopathy.  She does have pain in her back with laying and arising on the table. She is also tender in her low back Extremities are nonedematous and both femoral and pedal pulses are normal. Skin without pallor or jaundice.  Warm and dry, without rash. Neurologic exam reveals normal deep tendon reflexes and normal sensation.          Assessment & Plan:  1. Fatigue - POCT CBC; Standing - BASIC METABOLIC PANEL WITH GFR; Standing - Thyroid Panel With TSH - POCT CBC - BASIC METABOLIC PANEL WITH GFR  2. Vitamin D deficiency - Vitamin D 25 hydroxy; Standing - Vitamin D 25 hydroxy  3. Hyperlipemia - NMR Lipoprofile with Lipids; Standing - Hepatic function panel; Standing - NMR Lipoprofile with Lipids - Hepatic function panel  4. Adjustment disorder with mixed anxiety and depressed mood - LORazepam (ATIVAN) 0.5 MG tablet; Take 1 tablet (0.5 mg total) by mouth 2 (two) times daily as needed for anxiety.  Dispense: 30 tablet; Refill: 1  5. All of her prescriptions were written for her to mail off today   Patient Instructions  Continue current meds and therapeutic style changes Be careful don't fall   [facility] [place] is the name of the rehabilitation center.

## 2013-04-13 NOTE — Patient Instructions (Signed)
Continue current meds and therapeutic style changes Be careful don't fall

## 2013-04-14 ENCOUNTER — Telehealth: Payer: Self-pay | Admitting: Family Medicine

## 2013-04-14 LAB — NMR LIPOPROFILE WITH LIPIDS
Cholesterol, Total: 148 mg/dL (ref <200)
HDL Particle Number: 34.7 umol/L (ref >=30.5)
HDL-C: 46 mg/dL (ref >=40)
LDL (calc): 48 mg/dL (ref <100)
LDL Size: 19.8 nm — ABNORMAL LOW (ref >20.5)
LP-IR Score: 44 (ref <=45)
Large HDL-P: 8.6 umol/L (ref >=4.8)

## 2013-04-14 LAB — BASIC METABOLIC PANEL WITH GFR
BUN: 20 mg/dL (ref 6–23)
CO2: 29 mEq/L (ref 19–32)
GFR, Est African American: 76 mL/min
Glucose, Bld: 91 mg/dL (ref 70–99)
Potassium: 4.1 mEq/L (ref 3.5–5.3)
Sodium: 140 mEq/L (ref 135–145)

## 2013-04-14 LAB — VITAMIN D 25 HYDROXY (VIT D DEFICIENCY, FRACTURES): Vit D, 25-Hydroxy: 39 ng/mL (ref 30–89)

## 2013-04-17 ENCOUNTER — Telehealth: Payer: Self-pay | Admitting: *Deleted

## 2013-04-17 NOTE — Telephone Encounter (Signed)
Pt notified of results

## 2013-04-17 NOTE — Telephone Encounter (Signed)
Message copied by Bearl Mulberry on Mon Apr 17, 2013  9:56 AM ------      Message from: Ernestina Penna      Created: Fri Apr 14, 2013  8:23 PM       Electrolytes are good, blood sugar is good, creatinine a kidney function test is slightly elevated--- recheck BMP in 4 weeks      On the advanced lipid panel the total LDL particle number is at goal at 804, triglycerides are elevated at 268--- continue aggressive therapeutic lifestyle                      changes, as she is intolerant of fenofibrate      Vitamin D is good at 39      Liver function tests within normal limits       ------

## 2013-04-17 NOTE — Telephone Encounter (Signed)
No samples available Pt notified 

## 2013-05-01 ENCOUNTER — Other Ambulatory Visit: Payer: Self-pay | Admitting: *Deleted

## 2013-05-01 MED ORDER — FUROSEMIDE 40 MG PO TABS: 40.0000 mg | ORAL_TABLET | Freq: Every day | ORAL | Status: DC

## 2013-05-01 MED ORDER — ESZOPICLONE 3 MG PO TABS: 3.0000 mg | ORAL_TABLET | Freq: Every day | ORAL | Status: DC

## 2013-05-11 ENCOUNTER — Other Ambulatory Visit (INDEPENDENT_AMBULATORY_CARE_PROVIDER_SITE_OTHER): Payer: Medicare Other

## 2013-05-11 DIAGNOSIS — E559 Vitamin D deficiency, unspecified: Secondary | ICD-10-CM

## 2013-05-11 DIAGNOSIS — R5383 Other fatigue: Secondary | ICD-10-CM

## 2013-05-11 DIAGNOSIS — R5381 Other malaise: Secondary | ICD-10-CM

## 2013-05-11 DIAGNOSIS — E785 Hyperlipidemia, unspecified: Secondary | ICD-10-CM

## 2013-05-11 LAB — POCT CBC
Granulocyte percent: 64 %G (ref 37–80)
Hemoglobin: 12.3 g/dL (ref 12.2–16.2)
Lymph, poc: 2.3 (ref 0.6–3.4)
MCH, POC: 30.5 pg (ref 27–31.2)
MPV: 8.2 fL (ref 0–99.8)
POC Granulocyte: 4.8 (ref 2–6.9)
POC LYMPH PERCENT: 31 %L (ref 10–50)
Platelet Count, POC: 207 10*3/uL (ref 142–424)
RBC: 4 M/uL — AB (ref 4.04–5.48)

## 2013-05-11 LAB — HEPATIC FUNCTION PANEL
ALT: 31 U/L (ref 0–35)
AST: 29 U/L (ref 0–37)
Albumin: 3.9 g/dL (ref 3.5–5.2)
Total Bilirubin: 0.4 mg/dL (ref 0.3–1.2)

## 2013-05-12 LAB — BASIC METABOLIC PANEL WITH GFR
CO2: 30 mEq/L (ref 19–32)
Chloride: 103 mEq/L (ref 96–112)
Creat: 0.88 mg/dL (ref 0.50–1.10)
GFR, Est Non African American: 65 mL/min
Potassium: 4.9 mEq/L (ref 3.5–5.3)
Sodium: 139 mEq/L (ref 135–145)

## 2013-05-16 ENCOUNTER — Ambulatory Visit: Payer: Medicare Other | Admitting: Family Medicine

## 2013-05-16 LAB — NMR LIPOPROFILE WITH LIPIDS
Cholesterol, Total: 156 mg/dL (ref <200)
HDL Size: 8.8 nm — ABNORMAL LOW (ref >=9.2)
LDL (calc): 65 mg/dL (ref <100)
LDL Particle Number: 1264 nmol/L — ABNORMAL HIGH (ref <1000)
LP-IR Score: 66 — ABNORMAL HIGH (ref <=45)
Large VLDL-P: 6.8 nmol/L — ABNORMAL HIGH (ref <=2.7)
Small LDL Particle Number: 900 nmol/L — ABNORMAL HIGH (ref <=527)
Triglycerides: 239 mg/dL — ABNORMAL HIGH (ref <150)
VLDL Size: 52.3 nm — ABNORMAL HIGH (ref <=46.6)

## 2013-05-18 NOTE — Progress Notes (Signed)
Patient came in for labs only.

## 2013-05-22 ENCOUNTER — Telehealth: Payer: Self-pay | Admitting: *Deleted

## 2013-05-22 MED ORDER — ESOMEPRAZOLE MAGNESIUM 40 MG PO CPDR: 40.0000 mg | DELAYED_RELEASE_CAPSULE | Freq: Two times a day (BID) | ORAL | Status: DC

## 2013-05-22 NOTE — Telephone Encounter (Signed)
Pt notified of lab results RX printed and at the front for pick up

## 2013-07-06 DIAGNOSIS — K449 Diaphragmatic hernia without obstruction or gangrene: Secondary | ICD-10-CM | POA: Insufficient documentation

## 2013-07-06 DIAGNOSIS — R109 Unspecified abdominal pain: Secondary | ICD-10-CM | POA: Insufficient documentation

## 2013-07-06 HISTORY — DX: Diaphragmatic hernia without obstruction or gangrene: K44.9

## 2013-07-13 ENCOUNTER — Other Ambulatory Visit: Payer: Self-pay | Admitting: *Deleted

## 2013-07-13 MED ORDER — POTASSIUM CHLORIDE CRYS ER 20 MEQ PO TBCR: 20.0000 meq | EXTENDED_RELEASE_TABLET | Freq: Every day | ORAL | Status: DC

## 2013-08-07 ENCOUNTER — Other Ambulatory Visit: Payer: Self-pay | Admitting: *Deleted

## 2013-08-07 MED ORDER — ESZOPICLONE 3 MG PO TABS: 3.0000 mg | ORAL_TABLET | Freq: Every day | ORAL | Status: DC

## 2013-08-07 NOTE — Progress Notes (Signed)
Pt called requesting refill on [facility] for mail order RX printed and put up front for pick up

## 2013-08-16 ENCOUNTER — Ambulatory Visit: Payer: Medicare Other | Admitting: Family Medicine

## 2013-09-07 ENCOUNTER — Encounter: Payer: Self-pay | Admitting: Family Medicine

## 2013-09-07 ENCOUNTER — Ambulatory Visit (INDEPENDENT_AMBULATORY_CARE_PROVIDER_SITE_OTHER): Payer: Medicare Other | Admitting: Family Medicine

## 2013-09-07 ENCOUNTER — Ambulatory Visit (INDEPENDENT_AMBULATORY_CARE_PROVIDER_SITE_OTHER): Payer: Medicare Other

## 2013-09-07 VITALS — BP 115/67 | HR 59 | Temp 99.9°F | Ht 66.0 in | Wt 201.0 lb

## 2013-09-07 DIAGNOSIS — I10 Essential (primary) hypertension: Secondary | ICD-10-CM

## 2013-09-07 DIAGNOSIS — E785 Hyperlipidemia, unspecified: Secondary | ICD-10-CM

## 2013-09-07 DIAGNOSIS — F3289 Other specified depressive episodes: Secondary | ICD-10-CM

## 2013-09-07 DIAGNOSIS — E559 Vitamin D deficiency, unspecified: Secondary | ICD-10-CM

## 2013-09-07 DIAGNOSIS — F329 Major depressive disorder, single episode, unspecified: Secondary | ICD-10-CM

## 2013-09-07 DIAGNOSIS — E7800 Pure hypercholesterolemia, unspecified: Secondary | ICD-10-CM

## 2013-09-07 DIAGNOSIS — R5381 Other malaise: Secondary | ICD-10-CM

## 2013-09-07 LAB — POCT CBC
HCT, POC: 37 % — AB (ref 37.7–47.9)
Lymph, poc: 2 (ref 0.6–3.4)
MCHC: 33.9 g/dL (ref 31.8–35.4)
MCV: 89.3 fL (ref 80–97)
Platelet Count, POC: 197 10*3/uL (ref 142–424)
RDW, POC: 13 %
WBC: 7 10*3/uL (ref 4.6–10.2)

## 2013-09-07 NOTE — Progress Notes (Signed)
Subjective:    Patient ID: [name] [name], female    DOB: 19-Jul-1941, 72 y.o.   MRN: [id number]  HPI Pt here for follow up of chronic medical problems. He is still having a lot of problems with her back. She is trying to make a decision to go for surgery with a neurosurgery from [facility]. She is unable to walk but only short distances because of back pain. She is scheduled to see the cardiologist soon. She is followed by a gastroenterologist Dr. Jacinto Reap at [facility] also.    Patient Active Problem List   Diagnosis Date Noted  . Chronic diarrhea of unknown origin 01/26/2012  . History of IBS 01/26/2012  . Bacterial overgrowth syndrome 11/24/2011  . 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 EDEMA 06/25/2010  . DEGENERATIVE JOINT DISEASE 04/08/2010  . OTHER DISORDERS OF NERVOUS SYSTEM&SENSE ORGANS 04/08/2010  . HYPERCHOLESTEROLEMIA  IIA 04/08/2009  . OVERWEIGHT/OBESITY 04/04/2009  . CIRCADIAN RHYTHM SLEEP D/O DELAY SLEEP PHSE TYPE 04/04/2009  . CARPAL TUNNEL SYNDROME 04/04/2009  . CAD, UNSPECIFIED SITE 04/04/2009  . DIASTOLIC HEART FAILURE, CHRONIC 04/04/2009  . DIARRHEA 01/17/2009  . IRRITABLE BOWEL SYNDROME 11/13/2008  . ESOPHAGEAL STRICTURE 11/12/2008  . GERD 11/12/2008  . GASTRITIS, ACUTE 11/12/2008  . DUODENITIS, WITHOUT HEMORRHAGE 11/12/2008  . Diaphragmatic Hernia without Mention of Obstruction or Gangrene 11/12/2008  . DIVERTICULOSIS, COLON 11/12/2008  . COLONIC POLYPS, HX OF 11/12/2008  . OBSTRUCTIVE SLEEP APNEA 05/23/2008  . DYSPNEA 05/23/2008  . HYPERLIPIDEMIA 05/22/2008  . OBESITY 05/22/2008  . DEPRESSION 05/22/2008  . HYPERTENSION 05/22/2008  . INSOMNIA 05/22/2008   Outpatient Encounter Prescriptions as of 09/07/2013  Medication Sig Dispense Refill  . amLODipine (NORVASC) 2.5 MG tablet Take 1 tablet (2.5 mg total) by mouth daily.  90 tablet  3  . colesevelam (WELCHOL) 625 MG tablet Take 3 tablets (1,875 mg total) by  mouth 2 (two) times daily with a meal.  180 tablet  3  . diphenoxylate-atropine (LOMOTIL) 2.5-0.025 MG per tablet Take 1 tablet by mouth 4 (four) times daily as needed for diarrhea or loose stools.  30 tablet  1  . esomeprazole (NEXIUM) 40 MG capsule Take 1 capsule (40 mg total) by mouth daily before breakfast.  90 capsule  3  . esomeprazole (NEXIUM) 40 MG capsule Take 1 capsule (40 mg total) by mouth 2 (two) times daily.  180 capsule  3  . Eszopiclone (ESZOPICLONE) 3 MG TABS Take 1 tablet (3 mg total) by mouth at bedtime. Take immediately before bedtime  90 tablet  1  . fentaNYL (DURAGESIC - DOSED MCG/HR) 50 MCG/HR Place 1 patch (50 mcg total) onto the skin every 3 (three) days.  15 patch  0  . furosemide (LASIX) 40 MG tablet Take 1 tablet (40 mg total) by mouth daily.  90 tablet  3  . furosemide (LASIX) 40 MG tablet Take 1 tablet (40 mg total) by mouth daily.  90 tablet  3  . HYDROcodone-acetaminophen (NORCO) 10-325 MG per tablet Take 1 tablet by mouth every 6 (six) hours as needed. For pain.  120 tablet  1  . LORazepam (ATIVAN) 0.5 MG tablet Take 1 tablet (0.5 mg total) by mouth 2 (two) times daily as needed for anxiety.  30 tablet  1  . Multiple Vitamin (MULTIVITAMIN) tablet Take 1 tablet by mouth daily.        Marland Kitchen olmesartan (BENICAR) 20 MG tablet Take 0.5 tablets (10 mg total) by mouth daily.  90  tablet  4  . potassium chloride SA (K-DUR,KLOR-CON) 20 MEQ tablet Take 1 tablet (20 mEq total) by mouth daily.  90 tablet  4  . rosuvastatin (CRESTOR) 10 MG tablet Take 1-2 tablets (10-20 mg total) by mouth See admin instructions. Alternating 10mg  and 20mg  daily.  90 tablet  4   No facility-administered encounter medications on file as of 09/07/2013.     Review of Systems  Constitutional: Negative.   HENT: Negative.   Eyes: Positive for discharge (watery ). Eye itching: relieved by taking Allegra.  Respiratory: Positive for shortness of breath (with exertion).   Cardiovascular: Negative.    Gastrointestinal: Positive for abdominal pain (goes to GI for this) and constipation (intermitent). Diarrhea: intermitent.  Endocrine: Negative.   Genitourinary: Negative.   Musculoskeletal: Positive for back pain (arthritis) and arthralgias (bilateral knees).  Skin: Negative.   Allergic/Immunologic: Negative.   Neurological: Negative.   Hematological: Negative.   Psychiatric/Behavioral: The patient is nervous/anxious (better today).        Objective:   Physical Exam  Nursing note and vitals reviewed. Constitutional: She is oriented to person, place, and time. She appears well-developed and well-nourished.  Calmer than usual today  HENT:  Head: Normocephalic and atraumatic.  Right Ear: External ear normal.  Left Ear: External ear normal.  Nose: Mucosal edema present.  Mouth/Throat: Oropharynx is clear and moist.  patient has multiple epidermal cysts on her scalp  Eyes: Conjunctivae and EOM are normal. Pupils are equal, round, and reactive to light. Right eye exhibits no discharge. Left eye exhibits no discharge. No scleral icterus.  Neck: Normal range of motion. Neck supple. No JVD present. No thyromegaly present.  Cardiovascular: Normal rate, regular rhythm, normal heart sounds and intact distal pulses.  Exam reveals no gallop.   No murmur heard. At 60 per minute  Pulmonary/Chest: Effort normal and breath sounds normal. No respiratory distress. She has no wheezes. She has no rales.  Abdominal: Soft. Normal appearance and bowel sounds are normal. She exhibits no mass. There is tenderness (generalized). There is no rebound and no guarding.  Musculoskeletal: She exhibits tenderness (upper low back).  Range of motion was limited due to severe pain and stiffness and upper low back  Lymphadenopathy:    She has no cervical adenopathy.  Neurological: She is alert and oriented to person, place, and time. She has normal reflexes. No cranial nerve deficit.  Skin: Skin is warm and dry. No  rash noted. No erythema.  Psychiatric: She has a normal mood and affect. Her behavior is normal. Judgment and thought content normal.  More positive today in demeanor    BP 115/67  Pulse 59  Temp(Src) 99.9 F (37.7 C) (Oral)  Ht 5\' 6"  (1.676 m)  Wt 201 lb (91.173 kg)  BMI 32.46 kg/m2  Chest x-ray; degenerative changes and high diaphragm on the right Preliminary reading by Ernestina Penna, MD       Assessment & Plan:   1. HYPERCHOLESTEROLEMIA  IIA   2. HYPERLIPIDEMIA   3. DEPRESSION   4. HYPERTENSION   5. Vitamin D deficiency   6. Other malaise and fatigue    Orders Placed This Encounter  Procedures  . DG Chest 2 View    Standing Status: Future     Number of Occurrences: 1     Standing Expiration Date: 11/07/2014    Order Specific Question:  Reason for Exam (SYMPTOM  OR DIAGNOSIS REQUIRED)    Answer:  HTN    Order Specific Question:  Preferred imaging location?    Answer:  Internal  . Hepatic function panel  . NMR, lipoprofile  . BMP8+EGFR  . Vit D  25 hydroxy (rtn osteoporosis monitoring)  . POCT CBC   Continue current medication  Get flu shot in October Consider talking seriously with neurosurgeon about surgery on her back We will get the name of the  rehabilitation Center in [place] for recovery from back surgery  Nyra Capes MD

## 2013-09-07 NOTE — Patient Instructions (Addendum)
Continue current medications. Continue good therapeutic lifestyle changes.  Fall precautions discussed with patient. Schedule your flu vaccine the first of October. Follow up as planned and earlier as needed.   

## 2013-09-09 LAB — BMP8+EGFR
BUN: 14 mg/dL (ref 8–27)
Calcium: 9.3 mg/dL (ref 8.6–10.2)
Creatinine, Ser: 0.81 mg/dL (ref 0.57–1.00)
GFR calc non Af Amer: 72 mL/min/{1.73_m2} (ref >59)
Glucose: 93 mg/dL (ref 65–99)
Potassium: 4.4 mmol/L (ref 3.5–5.2)

## 2013-09-09 LAB — VITAMIN D 25 HYDROXY (VIT D DEFICIENCY, FRACTURES): Vit D, 25-Hydroxy: 36.3 ng/mL (ref 30.0–100.0)

## 2013-09-09 LAB — HEPATIC FUNCTION PANEL
ALT: 18 IU/L (ref 0–32)
AST: 20 IU/L (ref 0–40)
Alkaline Phosphatase: 72 IU/L (ref 39–117)

## 2013-09-09 LAB — NMR, LIPOPROFILE
LDL Particle Number: 1279 nmol/L — ABNORMAL HIGH (ref <1000)
LDLC SERPL CALC-MCNC: 69 mg/dL (ref <100)
LP-IR Score: 56 — ABNORMAL HIGH (ref <=45)

## 2013-10-09 ENCOUNTER — Encounter: Payer: Self-pay | Admitting: Cardiology

## 2013-10-09 ENCOUNTER — Ambulatory Visit (INDEPENDENT_AMBULATORY_CARE_PROVIDER_SITE_OTHER): Payer: Medicare Other | Admitting: Cardiology

## 2013-10-09 VITALS — BP 140/68 | Wt 203.0 lb

## 2013-10-09 DIAGNOSIS — I2510 Atherosclerotic heart disease of native coronary artery without angina pectoris: Secondary | ICD-10-CM

## 2013-10-09 DIAGNOSIS — I10 Essential (primary) hypertension: Secondary | ICD-10-CM

## 2013-10-09 DIAGNOSIS — E7800 Pure hypercholesterolemia, unspecified: Secondary | ICD-10-CM

## 2013-10-09 DIAGNOSIS — I5032 Chronic diastolic (congestive) heart failure: Secondary | ICD-10-CM

## 2013-10-09 DIAGNOSIS — R0602 Shortness of breath: Secondary | ICD-10-CM

## 2013-10-09 DIAGNOSIS — Z01818 Encounter for other preprocedural examination: Secondary | ICD-10-CM | POA: Insufficient documentation

## 2013-10-09 NOTE — Patient Instructions (Signed)
Your physician has requested that you have a lexiscan myoview. For further information please visit [email or web address] Please follow instruction sheet, as given.  Your physician recommends that you continue on your current medications as directed. Please refer to the Current Medication list given to you today.  Your physician wants you to follow-up in: 6 months with Dr. Shirlee Latch. You will receive a reminder letter in the mail two months in advance. If you don't receive a letter, please call our office to schedule the follow-up appointment.

## 2013-10-09 NOTE — Progress Notes (Signed)
Patient ID: [name] [name], female   DOB: Aug 15, 1941, 72 y.o.   MRN: [id number] PCP: Christell Constant  72 yo with history of HTN, hyperlipidemia, and nonobstructive CAD presents for cardiology followup. She has seen Dr. Riley Kill in the past and is seeing me for the first time today.  Main problem recently has been chronic low back pain.  She has had this for a while now but is now at the point where she cannot stand for more than five minutes at a time and any walking is painful.  She is unable to climb steps and unable to exercise.  No chest pain.  She has had chronic exertional dyspnea but not much recently as she has not been active.  She has seen a neurosurgeon at [facility] and is thinking about laminectomy.   ECG: NSR, normal  Labs (9/14) K 4.4, creatinine 0.81, LDL particle number 1279, LDL 69  PMH: 1. Chronic low back pain: spinal stenosis. 2. Obesity 3. H/o diverticulitis 4. IBS 5. H/o colon polyps 6. GERD: h/o hiatal hernia 7. OSA 8. HTN 9. Hyperlipidemia 10. Depression 11. Cholecystectomy 12. H/o bilateral TKR 13. CAD: LHC (5/09) with 50% ostial LCx, 30-40% mRCA, 30% mLAD.   SH: Widow, 2 children, retired Comptroller, nonsmoker.    FH: CAD in father, mother, brother.    ROS: All systems reviewed and negative except as per HPI.   Current Outpatient Prescriptions  Medication Sig Dispense Refill  . amLODipine (NORVASC) 2.5 MG tablet Take 1 tablet (2.5 mg total) by mouth daily.  90 tablet  3  . colesevelam (WELCHOL) 625 MG tablet Take 3 tablets (1,875 mg total) by mouth 2 (two) times daily with a meal.  180 tablet  3  . diphenoxylate-atropine (LOMOTIL) 2.5-0.025 MG per tablet Take 1 tablet by mouth 4 (four) times daily as needed for diarrhea or loose stools.  30 tablet  1  . esomeprazole (NEXIUM) 40 MG capsule Take 1 capsule (40 mg total) by mouth daily before breakfast.  90 capsule  3  . Eszopiclone (ESZOPICLONE) 3 MG TABS Take 1 tablet (3 mg total) by mouth at bedtime. Take immediately  before bedtime  90 tablet  1  . fentaNYL (DURAGESIC - DOSED MCG/HR) 50 MCG/HR Place 1 patch (50 mcg total) onto the skin every 3 (three) days.  15 patch  0  . furosemide (LASIX) 40 MG tablet Take 1 tablet (40 mg total) by mouth daily.  90 tablet  3  . HYDROcodone-acetaminophen (NORCO) 10-325 MG per tablet Take 1 tablet by mouth every 6 (six) hours as needed. For pain.  120 tablet  1  . LORazepam (ATIVAN) 0.5 MG tablet Take 1 tablet (0.5 mg total) by mouth 2 (two) times daily as needed for anxiety.  30 tablet  1  . Multiple Vitamin (MULTIVITAMIN) tablet Take 1 tablet by mouth daily.        Marland Kitchen olmesartan (BENICAR) 20 MG tablet Take 0.5 tablets (10 mg total) by mouth daily.  90 tablet  4  . potassium chloride SA (K-DUR,KLOR-CON) 20 MEQ tablet Take 1 tablet (20 mEq total) by mouth daily.  90 tablet  4  . PROZAC 40 MG capsule Take 40 mg by mouth daily.      . rosuvastatin (CRESTOR) 10 MG tablet Take 1-2 tablets (10-20 mg total) by mouth See admin instructions. Alternating 10mg  and 20mg  daily.  90 tablet  4   No current facility-administered medications for this visit.   BP 140/68  Wt 92.08 kg (  203 lb)  BMI 32.78 kg/m2 General: NAD Neck: No JVD, no thyromegaly or thyroid nodule.  Lungs: Clear to auscultation bilaterally with normal respiratory effort. CV: Nondisplaced PMI.  Heart regular S1/S2, no S3/S4, no murmur.  No peripheral edema.  No carotid bruit.  Normal pedal pulses.  Abdomen: Soft, nontender, no hepatosplenomegaly, no distention.  Skin: Intact without lesions or rashes.  Neurologic: Alert and oriented x 3.  Psych: Normal affect. Extremities: No clubbing or cyanosis.   Assessment/Plan: 1. Preoperative evaluation: Patient will likely undergo back surgery in the near future.  Given the severity of her symptoms, I think that this is needed.  She is very limited physically.  She does have a history of moderate nonobstructive CAD.  I think that pre-operative stress testing would be warranted  given her lack of mobility.  I will arrange a Lexiscan Cardiolite.  2. CAD: Moderate nonobstructive CAD on 5/09 cardiac cath.  No ischemic symptoms (chronic exertional dyspnea) but not very active due to back pain.  Continue ASA 81 mg daily and statin.  3. Hyperlipidemia: Excellent LDL though LDL particle number is still elevated.  Continue current Crestor dosing.  4. HTN: BP is borderline elevated today but she is in pain.  No changes.   Marca Ancona 10/09/2013

## 2013-10-13 ENCOUNTER — Ambulatory Visit: Payer: Medicare Other | Admitting: Cardiology

## 2013-10-16 ENCOUNTER — Ambulatory Visit (INDEPENDENT_AMBULATORY_CARE_PROVIDER_SITE_OTHER): Payer: Medicare Other

## 2013-10-16 DIAGNOSIS — Z23 Encounter for immunization: Secondary | ICD-10-CM

## 2013-10-23 ENCOUNTER — Other Ambulatory Visit: Payer: Self-pay | Admitting: *Deleted

## 2013-10-23 ENCOUNTER — Encounter (HOSPITAL_COMMUNITY): Payer: Medicare Other

## 2013-10-24 ENCOUNTER — Ambulatory Visit (HOSPITAL_COMMUNITY): Payer: Medicare Other | Attending: Cardiovascular Disease | Admitting: Radiology

## 2013-10-24 ENCOUNTER — Other Ambulatory Visit: Payer: Self-pay | Admitting: *Deleted

## 2013-10-24 VITALS — BP 110/65 | Ht 66.0 in | Wt 205.0 lb

## 2013-10-24 DIAGNOSIS — I5032 Chronic diastolic (congestive) heart failure: Secondary | ICD-10-CM

## 2013-10-24 DIAGNOSIS — I10 Essential (primary) hypertension: Secondary | ICD-10-CM | POA: Insufficient documentation

## 2013-10-24 DIAGNOSIS — E785 Hyperlipidemia, unspecified: Secondary | ICD-10-CM | POA: Insufficient documentation

## 2013-10-24 DIAGNOSIS — R0602 Shortness of breath: Secondary | ICD-10-CM

## 2013-10-24 DIAGNOSIS — Z01810 Encounter for preprocedural cardiovascular examination: Secondary | ICD-10-CM | POA: Insufficient documentation

## 2013-10-24 DIAGNOSIS — Z8249 Family history of ischemic heart disease and other diseases of the circulatory system: Secondary | ICD-10-CM | POA: Insufficient documentation

## 2013-10-24 DIAGNOSIS — Z8673 Personal history of transient ischemic attack (TIA), and cerebral infarction without residual deficits: Secondary | ICD-10-CM | POA: Insufficient documentation

## 2013-10-24 DIAGNOSIS — R0609 Other forms of dyspnea: Secondary | ICD-10-CM | POA: Insufficient documentation

## 2013-10-24 DIAGNOSIS — R0989 Other specified symptoms and signs involving the circulatory and respiratory systems: Secondary | ICD-10-CM | POA: Insufficient documentation

## 2013-10-24 DIAGNOSIS — I2510 Atherosclerotic heart disease of native coronary artery without angina pectoris: Secondary | ICD-10-CM

## 2013-10-24 MED ORDER — REGADENOSON 0.4 MG/5ML IV SOLN
0.4000 mg | Freq: Once | INTRAVENOUS | Status: AC
  Administered 2013-10-24: 0.4 mg via INTRAVENOUS

## 2013-10-24 MED ORDER — TECHNETIUM TC 99M SESTAMIBI GENERIC - CARDIOLITE
33.0000 | Freq: Once | INTRAVENOUS | Status: AC | PRN
  Administered 2013-10-24: 33 via INTRAVENOUS

## 2013-10-24 MED ORDER — FLUOXETINE HCL 40 MG PO CAPS: 40.0000 mg | ORAL_CAPSULE | Freq: Every day | ORAL | Status: DC

## 2013-10-24 MED ORDER — TECHNETIUM TC 99M SESTAMIBI GENERIC - CARDIOLITE
11.0000 | Freq: Once | INTRAVENOUS | Status: AC | PRN
  Administered 2013-10-24: 11 via INTRAVENOUS

## 2013-10-24 NOTE — Progress Notes (Addendum)
[facility] SITE 3 NUCLEAR MED [address], [place] [address] [phone]    Cardiology Nuclear Med [name] [name] is a 72 y.o. female     MRN : [id number]     DOB: [birth year]  Procedure Date: 10/24/2013  Nuclear Med Background Indication for Stress Test:  Evaluation for Ischemia and Surgical Clearance:Back Surgery with Dr.Alexander Powers([place]) History:  MPI: Done at hospital Cardiac Risk Factors: CVA, Family History - CAD, Hypertension and Lipids  Symptoms:  DOE and SOB   Nuclear Pre-Procedure Caffeine/Decaff Intake:  None NPO After: 11:30pm   Lungs:  clear O2 Sat: 99% on room air. IV 0.9% NS with Angio Cath:  22g  IV Site: R Hand  IV Started by:  Cathlyn Parsons, RN  Chest Size (in):  44 Cup Size: D  Height: 5\' 6"  (1.676 m)  Weight:  205 lb (92.987 kg)  BMI:  Body mass index is 33.1 kg/(m^2). Tech Comments:  Aminophylline 75mg  given for symptoms and all were resolved.    Nuclear Med Study 1 or 2 day study: 1 day  Stress Test Type:  Eugenie Birks  Reading MD: Charlton Haws, MD  Order Authorizing Provider:  Fransico Meadow  Resting Radionuclide: Technetium 77m Sestamibi  Resting Radionuclide Dose: 11.0 mCi   Stress Radionuclide:  Technetium 35m Sestamibi  Stress Radionuclide Dose: 33.0 mCi           Stress Protocol Rest HR: 58 Stress HR: 85  Rest BP: 110/65 Stress BP: 131/51  Exercise Time (min): n/a METS: n/a   Predicted Max HR: 146 bpm % Max HR: 58.22 bpm Rate Pressure Product: 21308   Dose of Adenosine (mg):  n/a Dose of Lexiscan: 0.4 mg  Dose of Atropine (mg): n/a Dose of Dobutamine: n/a mcg/kg/min (at max HR)  Stress Test Technologist: Milana Na, EMT-P  Nuclear Technologist:  Harlow Asa, CNMT     Rest Procedure:  Myocardial perfusion imaging was performed at rest 45 minutes following the intravenous administration of Technetium 78m Sestamibi. Rest ECG: NSR - Normal EKG  Stress Procedure:  The patient received  IV Lexiscan 0.4 mg over 15-seconds.  Technetium 92m Sestamibi injected at 30-seconds. This patient was sob and had abdominal pain with the Lexiscan injection. Quantitative spect images were obtained after a 45 minute delay. Stress ECG: No significant change from baseline ECG  QPS Raw Data Images:  There is interference from nuclear activity from structures below the diaphragm. This does not affect the ability to read the study. Stress Images:  Normal homogeneous uptake in all areas of the myocardium. Rest Images:  Normal homogeneous uptake in all areas of the myocardium. Subtraction (SDS):  Normal Transient Ischemic Dilatation (Normal <1.22):  0.80 Lung/Heart Ratio (Normal <0.45):  0.30  Quantitative Gated Spect Images QGS EDV:  86 ml QGS ESV:  27 ml  Impression Exercise Capacity:  Lexiscan with no exercise. BP Response:  Normal blood pressure response. Clinical Symptoms:  There is dyspnea. ECG Impression:  No significant ST segment change suggestive of ischemia. Comparison with Prior Nuclear Study: No images to compare  Overall Impression:  Normal stress nuclear study.Significant lateral bowel artifact on resting images  LV Ejection Fraction: 68%.  LV Wall Motion:  NL LV Function; NL Wall Motion   [name] [name]  Normal study.  Please inform patient.   [name] [name] 10/25/2013

## 2013-10-25 ENCOUNTER — Encounter: Payer: Self-pay | Admitting: Internal Medicine

## 2013-10-26 NOTE — Progress Notes (Signed)
The patient is aware of her results.  

## 2013-12-20 ENCOUNTER — Encounter (INDEPENDENT_AMBULATORY_CARE_PROVIDER_SITE_OTHER): Payer: Self-pay

## 2013-12-20 ENCOUNTER — Ambulatory Visit (INDEPENDENT_AMBULATORY_CARE_PROVIDER_SITE_OTHER): Payer: Medicare Other | Admitting: Family Medicine

## 2013-12-20 ENCOUNTER — Encounter: Payer: Self-pay | Admitting: Family Medicine

## 2013-12-20 VITALS — BP 115/65 | HR 62 | Temp 98.8°F | Ht 66.0 in | Wt 204.0 lb

## 2013-12-20 DIAGNOSIS — F4323 Adjustment disorder with mixed anxiety and depressed mood: Secondary | ICD-10-CM

## 2013-12-20 DIAGNOSIS — M47817 Spondylosis without myelopathy or radiculopathy, lumbosacral region: Secondary | ICD-10-CM

## 2013-12-20 DIAGNOSIS — E785 Hyperlipidemia, unspecified: Secondary | ICD-10-CM

## 2013-12-20 DIAGNOSIS — R5381 Other malaise: Secondary | ICD-10-CM

## 2013-12-20 DIAGNOSIS — M47816 Spondylosis without myelopathy or radiculopathy, lumbar region: Secondary | ICD-10-CM

## 2013-12-20 DIAGNOSIS — M545 Low back pain, unspecified: Secondary | ICD-10-CM

## 2013-12-20 DIAGNOSIS — R5383 Other fatigue: Secondary | ICD-10-CM

## 2013-12-20 DIAGNOSIS — I10 Essential (primary) hypertension: Secondary | ICD-10-CM

## 2013-12-20 DIAGNOSIS — K219 Gastro-esophageal reflux disease without esophagitis: Secondary | ICD-10-CM

## 2013-12-20 DIAGNOSIS — Z23 Encounter for immunization: Secondary | ICD-10-CM

## 2013-12-20 DIAGNOSIS — E559 Vitamin D deficiency, unspecified: Secondary | ICD-10-CM

## 2013-12-20 DIAGNOSIS — I2510 Atherosclerotic heart disease of native coronary artery without angina pectoris: Secondary | ICD-10-CM

## 2013-12-20 MED ORDER — ALPRAZOLAM 0.25 MG PO TABS: 0.2500 mg | ORAL_TABLET | Freq: Every evening | ORAL | Status: DC | PRN

## 2013-12-20 NOTE — Addendum Note (Signed)
Addended by: Magdalene River on: 12/20/2013 01:08 PM   Modules accepted: Orders

## 2013-12-20 NOTE — Progress Notes (Signed)
Subjective:    Patient ID: [name] [name], female    DOB: 1941/06/22, 72 y.o.   MRN: [id number]  HPI Pt here for follow up and management of chronic medical problems. : Health maintenance her DEXA scan is not view until April of 2015. Patient refuses to do an FOBT. She will get her lab work today. She is also due a Prevnar vaccine. She be visiting with the neurosurgeon in late January to schedule surgery for her back in [place].     Patient Active Problem List   Diagnosis Date Noted  . Preoperative evaluation of a medical condition to rule out surgical contraindications (TAR required) 10/09/2013  . History of IBS 01/26/2012  . 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 EDEMA 06/25/2010  . DEGENERATIVE JOINT DISEASE 04/08/2010  . OTHER DISORDERS OF NERVOUS SYSTEM&SENSE ORGANS 04/08/2010  . HYPERCHOLESTEROLEMIA  IIA 04/08/2009  . OVERWEIGHT/OBESITY 04/04/2009  . CIRCADIAN RHYTHM SLEEP D/O DELAY SLEEP PHSE TYPE 04/04/2009  . CARPAL TUNNEL SYNDROME 04/04/2009  . CAD, UNSPECIFIED SITE 04/04/2009  . DIASTOLIC HEART FAILURE, CHRONIC 04/04/2009  . DIARRHEA 01/17/2009  . IRRITABLE BOWEL SYNDROME 11/13/2008  . ESOPHAGEAL STRICTURE 11/12/2008  . GERD 11/12/2008  . DUODENITIS, WITHOUT HEMORRHAGE 11/12/2008  . Diaphragmatic Hernia without Mention of Obstruction or Gangrene 11/12/2008  . DIVERTICULOSIS, COLON 11/12/2008  . COLONIC POLYPS, HX OF 11/12/2008  . OBSTRUCTIVE SLEEP APNEA 05/23/2008  . DYSPNEA 05/23/2008  . HYPERLIPIDEMIA 05/22/2008  . DEPRESSION 05/22/2008  . HYPERTENSION 05/22/2008  . INSOMNIA 05/22/2008   Outpatient Encounter Prescriptions as of 12/20/2013  Medication Sig  . amLODipine (NORVASC) 2.5 MG tablet Take 1 tablet (2.5 mg total) by mouth daily.  Marland Kitchen aspirin EC 81 MG tablet Take 81 mg by mouth daily.  . colesevelam (WELCHOL) 625 MG tablet Take 3 tablets (1,875 mg total) by mouth 2 (two) times daily with a meal.  . esomeprazole (NEXIUM) 40 MG capsule Take 1  capsule (40 mg total) by mouth daily before breakfast.  . fentaNYL (DURAGESIC - DOSED MCG/HR) 50 MCG/HR Place 1 patch onto the skin every other day.  Marland Kitchen FLUoxetine (PROZAC) 40 MG capsule Take 1 capsule (40 mg total) by mouth daily.  . furosemide (LASIX) 40 MG tablet Take 1 tablet (40 mg total) by mouth daily.  Marland Kitchen HYDROcodone-acetaminophen (NORCO) 10-325 MG per tablet Take 1 tablet by mouth every 6 (six) hours as needed. For pain.  . Multiple Vitamin (MULTIVITAMIN) tablet Take 1 tablet by mouth daily.    Marland Kitchen olmesartan (BENICAR) 20 MG tablet Take 0.5 tablets (10 mg total) by mouth daily.  . potassium chloride SA (K-DUR,KLOR-CON) 20 MEQ tablet Take 1 tablet (20 mEq total) by mouth daily.  . rosuvastatin (CRESTOR) 10 MG tablet Take 1-2 tablets (10-20 mg total) by mouth See admin instructions. Alternating 10mg  and 20mg  daily.  . [DISCONTINUED] LORazepam (ATIVAN) 0.5 MG tablet Take 1 tablet (0.5 mg total) by mouth 2 (two) times daily as needed for anxiety.  . diphenoxylate-atropine (LOMOTIL) 2.5-0.025 MG per tablet Take 1 tablet by mouth 4 (four) times daily as needed for diarrhea or loose stools.  . Eszopiclone (ESZOPICLONE) 3 MG TABS Take 1 tablet (3 mg total) by mouth at bedtime. Take immediately before bedtime    Review of Systems  Constitutional: Negative.   HENT: Negative.   Eyes: Negative.   Respiratory: Negative.   Cardiovascular: Negative.   Gastrointestinal: Negative.   Endocrine: Negative.   Genitourinary: Negative.   Musculoskeletal: Positive for back pain (surgury to be scheduled).  Skin:  Negative.   Allergic/Immunologic: Negative.   Neurological: Negative.   Hematological: Negative.   Psychiatric/Behavioral: Positive for sleep disturbance (not sleeping well at night).       Objective:   Physical Exam  Nursing note and vitals reviewed. Constitutional: She is oriented to person, place, and time. She appears well-developed and well-nourished. She appears distressed (continues to  be distressed about her chronic pain in her back which seems to never go away and his words with standing and walking especially).  HENT:  Head: Normocephalic and atraumatic.  Right Ear: External ear normal.  Left Ear: External ear normal.  Nose: Nose normal.  Mouth/Throat: Oropharynx is clear and moist.  Eyes: Conjunctivae and EOM are normal. Pupils are equal, round, and reactive to light. Right eye exhibits no discharge. Left eye exhibits no discharge. No scleral icterus.  Neck: Normal range of motion. Neck supple. No thyromegaly present.  No carotid bruits audible  Cardiovascular: Normal rate, regular rhythm, normal heart sounds and intact distal pulses.  Exam reveals no gallop and no friction rub.   No murmur heard. regular rate and rhythm at 72 per minute  Pulmonary/Chest: Effort normal and breath sounds normal. No respiratory distress. She has no wheezes. She has no rales. She exhibits no tenderness.  Abdominal: Soft. Bowel sounds are normal. She exhibits no mass. There is tenderness (slight upper abdominal tenderness). There is no rebound and no guarding.  Musculoskeletal: She exhibits no edema and no tenderness.  Limited range of motion secondary to back pain  Lymphadenopathy:    She has no cervical adenopathy.  Neurological: She is alert and oriented to person, place, and time. She has normal reflexes. No cranial nerve deficit.  Skin: Skin is warm and dry.  Psychiatric: She has a normal mood and affect. Her behavior is normal. Judgment and thought content normal.  Depressed   BP 115/65  Pulse 62  Temp(Src) 98.8 F (37.1 C) (Oral)  Ht 5\' 6"  (1.676 m)  Wt 204 lb (92.534 kg)  BMI 32.94 kg/m2        Assessment & Plan:  1. CAD, UNSPECIFIED SITE - POCT CBC  2. GERD - POCT CBC  3. HYPERLIPIDEMIA - POCT CBC - NMR, lipoprofile  4. HYPERTENSION - POCT CBC - Hepatic function panel - BMP8+EGFR  5. Vitamin D deficiency - Vit D  25 hydroxy (rtn osteoporosis  monitoring)  6. Fatigue  7. Adjustment disorder with mixed anxiety and depressed mood  8. Low back pain  9. Osteoarthritis of lumbar spine -Pending surgery, has had approval by the cardiologist  Orders Placed This Encounter  Procedures  . Pneumococcal conjugate vaccine 13-valent  . Hepatic function panel  . BMP8+EGFR  . NMR, lipoprofile  . Vit D  25 hydroxy (rtn osteoporosis monitoring)  . POCT CBC   No orders of the defined types were placed in this encounter.   Patient Instructions  Continue current medications. Continue good therapeutic lifestyle changes which include good diet and exercise. Fall precautions discussed with patient. Schedule your flu vaccine if you haven't had it yet If you are over 78 years old - you may need Prevnar 13 or the adult Pneumonia vaccine. FOBT!!---- please return it Try to just walk as much as possible   Nyra Capes MD

## 2013-12-20 NOTE — Patient Instructions (Addendum)
Continue current medications. Continue good therapeutic lifestyle changes which include good diet and exercise. Fall precautions discussed with patient. Schedule your flu vaccine if you haven't had it yet If you are over 74 years old - you may need Prevnar 13 or the adult Pneumonia vaccine. FOBT!!---- please return it Try to just walk as much as possible

## 2013-12-22 LAB — HEPATIC FUNCTION PANEL
ALT: 22 IU/L (ref 0–32)
AST: 22 IU/L (ref 0–40)
Albumin: 4.3 g/dL (ref 3.5–4.8)
Alkaline Phosphatase: 68 IU/L (ref 39–117)
Bilirubin, Direct: 0.09 mg/dL (ref 0.00–0.40)
Total Bilirubin: 0.3 mg/dL (ref 0.0–1.2)
Total Protein: 6.1 g/dL (ref 6.0–8.5)

## 2013-12-22 LAB — POCT CBC
Granulocyte percent: 64.4 %G (ref 37–80)
HCT, POC: 38.7 % (ref 37.7–47.9)
Hemoglobin: 12.5 g/dL (ref 12.2–16.2)
Lymph, poc: 1.9 (ref 0.6–3.4)
MCH, POC: 29.4 pg (ref 27–31.2)
MCHC: 32.3 g/dL (ref 31.8–35.4)
MCV: 91.1 fL (ref 80–97)
MPV: 7.9 fL (ref 0–99.8)
POC Granulocyte: 4.2 (ref 2–6.9)
POC LYMPH PERCENT: 29.1 %L (ref 10–50)
Platelet Count, POC: 171 10*3/uL (ref 142–424)
RBC: 4.2 M/uL (ref 4.04–5.48)
RDW, POC: 12.8 %
WBC: 6.5 10*3/uL (ref 4.6–10.2)

## 2013-12-22 LAB — BMP8+EGFR
BUN/Creatinine Ratio: 17 (ref 11–26)
BUN: 14 mg/dL (ref 8–27)
CO2: 29 mmol/L (ref 18–29)
Calcium: 9.2 mg/dL (ref 8.6–10.2)
Chloride: 99 mmol/L (ref 97–108)
Creatinine, Ser: 0.81 mg/dL (ref 0.57–1.00)
GFR calc Af Amer: 83 mL/min/{1.73_m2} (ref >59)
GFR calc non Af Amer: 72 mL/min/{1.73_m2} (ref >59)
Glucose: 112 mg/dL — ABNORMAL HIGH (ref 65–99)
Potassium: 4.3 mmol/L (ref 3.5–5.2)
Sodium: 141 mmol/L (ref 134–144)

## 2013-12-22 LAB — VITAMIN D 25 HYDROXY (VIT D DEFICIENCY, FRACTURES): Vit D, 25-Hydroxy: 46.6 ng/mL (ref 30.0–100.0)

## 2013-12-22 NOTE — Addendum Note (Signed)
Addended by: Pollyann Kennedy F on: 12/22/2013 10:28 AM   Modules accepted: Orders

## 2013-12-24 LAB — NMR, LIPOPROFILE
CHOLESTEROL: 142 mg/dL (ref <200)
HDL Cholesterol by NMR: 52 mg/dL (ref >=40)
HDL Particle Number: 37.4 umol/L (ref >=30.5)
LDL PARTICLE NUMBER: 933 nmol/L (ref <1000)
LDL Size: 20.1 nm — ABNORMAL LOW (ref >20.5)
LDLC SERPL CALC-MCNC: 56 mg/dL (ref <100)
LP-IR Score: 63 — ABNORMAL HIGH (ref <=45)
SMALL LDL PARTICLE NUMBER: 737 nmol/L — AB (ref <=527)
TRIGLYCERIDES BY NMR: 172 mg/dL — AB (ref <150)

## 2014-02-01 ENCOUNTER — Other Ambulatory Visit: Payer: Self-pay | Admitting: Family Medicine

## 2014-02-13 ENCOUNTER — Other Ambulatory Visit: Payer: Self-pay

## 2014-02-13 MED ORDER — HYDROMORPHONE HCL 2 MG PO TABS: ORAL_TABLET | ORAL | Status: DC

## 2014-02-13 MED ORDER — FENTANYL 50 MCG/HR TD PT72: MEDICATED_PATCH | TRANSDERMAL | Status: DC

## 2014-02-13 MED ORDER — ESZOPICLONE 3 MG PO TABS: 3.0000 mg | ORAL_TABLET | Freq: Every day | ORAL | Status: DC

## 2014-02-13 NOTE — Telephone Encounter (Signed)
Rx faxed to [facility] @ [phone], phone number [phone]  

## 2014-02-14 ENCOUNTER — Non-Acute Institutional Stay (SKILLED_NURSING_FACILITY): Payer: Medicare Other | Admitting: Adult Health

## 2014-02-14 DIAGNOSIS — F3289 Other specified depressive episodes: Secondary | ICD-10-CM

## 2014-02-14 DIAGNOSIS — G4700 Insomnia, unspecified: Secondary | ICD-10-CM

## 2014-02-14 DIAGNOSIS — Z981 Arthrodesis status: Secondary | ICD-10-CM

## 2014-02-14 DIAGNOSIS — D62 Acute posthemorrhagic anemia: Secondary | ICD-10-CM

## 2014-02-14 DIAGNOSIS — K219 Gastro-esophageal reflux disease without esophagitis: Secondary | ICD-10-CM

## 2014-02-14 DIAGNOSIS — E785 Hyperlipidemia, unspecified: Secondary | ICD-10-CM

## 2014-02-14 DIAGNOSIS — F329 Major depressive disorder, single episode, unspecified: Secondary | ICD-10-CM

## 2014-02-14 DIAGNOSIS — G894 Chronic pain syndrome: Secondary | ICD-10-CM

## 2014-02-14 DIAGNOSIS — I10 Essential (primary) hypertension: Secondary | ICD-10-CM

## 2014-02-23 ENCOUNTER — Encounter: Payer: Self-pay | Admitting: Internal Medicine

## 2014-02-23 ENCOUNTER — Non-Acute Institutional Stay (SKILLED_NURSING_FACILITY): Payer: Medicare Other | Admitting: Internal Medicine

## 2014-02-23 ENCOUNTER — Encounter: Payer: Self-pay | Admitting: *Deleted

## 2014-02-23 DIAGNOSIS — G894 Chronic pain syndrome: Secondary | ICD-10-CM

## 2014-02-23 DIAGNOSIS — K589 Irritable bowel syndrome without diarrhea: Secondary | ICD-10-CM

## 2014-02-23 DIAGNOSIS — D62 Acute posthemorrhagic anemia: Secondary | ICD-10-CM

## 2014-02-23 DIAGNOSIS — K219 Gastro-esophageal reflux disease without esophagitis: Secondary | ICD-10-CM

## 2014-02-23 DIAGNOSIS — I10 Essential (primary) hypertension: Secondary | ICD-10-CM

## 2014-02-23 DIAGNOSIS — Z981 Arthrodesis status: Secondary | ICD-10-CM

## 2014-02-23 DIAGNOSIS — E785 Hyperlipidemia, unspecified: Secondary | ICD-10-CM

## 2014-02-23 DIAGNOSIS — E669 Obesity, unspecified: Secondary | ICD-10-CM

## 2014-02-23 DIAGNOSIS — G4733 Obstructive sleep apnea (adult) (pediatric): Secondary | ICD-10-CM

## 2014-02-23 NOTE — Assessment & Plan Note (Signed)
Discharge Hb was 9.3; will follow

## 2014-02-23 NOTE — Assessment & Plan Note (Signed)
Benicar, lasix, norvasc to continue

## 2014-02-23 NOTE — Assessment & Plan Note (Signed)
Would agree with this;pt is already on prozac 40 mg;psych consult

## 2014-02-23 NOTE — Assessment & Plan Note (Signed)
Correct diet and rehab;s/p surgery hopefully pain will br dec and acticity can be inc

## 2014-02-23 NOTE — Assessment & Plan Note (Signed)
Reported pt can not tolerate Cpap or Bipap

## 2014-02-23 NOTE — Assessment & Plan Note (Addendum)
For lumbar scoliosis and chronic pain; pt is having trouble with pain and pain meds are being reworked today to inc coverage within pt's allergies

## 2014-02-23 NOTE — Progress Notes (Signed)
MRN: [id number] Name: Chapin Hally Wiles  Sex: female Age: 73 y.o. DOB: [birth year]  [facility]: [facility] Facility/Room: 1202 Level Of Care: SNF Provider: Inocencio Homes D Emergency Contacts: Extended Emergency Contact Information Primary Emergency Contact: Kindall,[name] Address: [place], [place] [address] Johnnette Litter of [place] Phone: [phone] Mobile Phone: [phone] Relation: Daughter Secondary Emergency Contact: Larene Pickett Address: [place], [place] Phone: [phone] Work Phone: [phone] Relation: Relative    Allergies: Morphine and related; Altace; Duloxetine; Floxin; Lipitor; Lovaza; Oxycodone-acetaminophen; Percocet; Pravachol; Trilipix; Zetia; and Zocor  Chief Complaint  Patient presents with  . nursing home admission    HPI: Patient is 73 y.o. female who has been admitted for OT/PT after spinal fusion low back for stenosis and chronic pain.  Past Medical History  Diagnosis Date  . Obesity   . Circadian rhythm sleep disorder   . CTS (carpal tunnel syndrome)   . Diarrhea   . IBS (irritable bowel syndrome)   . Diverticulitis of colon   . Gastritis   . Esophageal stricture   . Personal history of colonic polyps 10/25/2011 & 12/02/11    not retrieved Dr Lyla Son & tubular adenomas  . Hiatal hernia   . GERD (gastroesophageal reflux disease)   . Obstructive sleep apnea   . Insomnia   . HTN (hypertension)   . Hyperlipidemia   . Depression   . DIASTOLIC HEART FAILURE, CHRONIC 04/04/2009    Qualifier: Diagnosis of  By: Romona Curls      Past Surgical History  Procedure Laterality Date  . Carpal tunnel release    . Cholecystectomy  2003  . Total knee arthroplasty      bilateral      Medication List       This list is accurate as of: 02/23/14  9:51 PM.  Always use your most recent med list.               ALPRAZolam 0.25 MG tablet  Commonly known as:  XANAX   Take 1 tablet (0.25 mg total) by mouth at bedtime as needed for anxiety.     amLODipine 2.5 MG tablet  Commonly known as:  NORVASC  Take 1 tablet by mouth  daily. For HTN     aspirin EC 81 MG tablet  Take 81 mg by mouth daily. For prophylasxis     colesevelam 625 MG tablet  Commonly known as:  WELCHOL  Take 1,875 mg by mouth 2 (two) times daily with a meal. For hyperlipidemia     diphenoxylate-atropine 2.5-0.025 MG per tablet  Commonly known as:  LOMOTIL  Take 1 tablet by mouth 4 (four) times daily as needed for diarrhea or loose stools.     esomeprazole 40 MG capsule  Commonly known as:  NEXIUM  Take 40 mg by mouth daily before breakfast. For GERD     Eszopiclone 3 MG Tabs  Take 3 mg by mouth at bedtime. For insomnia,take immediately before bedtime.     fentaNYL 50 MCG/HR  Commonly known as:  DURAGESIC - dosed mcg/hr  Apply 1 patch every 72 hours *REOMVE OLD PATCH* EXTERNAL USE ONLY *ROTATE SITES*CHECK PLACEMENT OF PATCH EVERY SHIFT*     FLUoxetine 40 MG capsule  Commonly known as:  PROZAC  Take 40 mg by mouth daily. For depression     furosemide  40 MG tablet  Commonly known as:  LASIX  Take 40 mg by mouth daily. For HTN/ Edema     HYDROcodone-acetaminophen 10-325 MG per tablet  Commonly known as:  NORCO  Take 1 tablet by mouth every 6 (six) hours as needed. For pain.     HYDROmorphone 2 MG tablet  Commonly known as:  DILAUDID  1/2 tablet (1 mg) by mouth every 4 hours as needed for pain     hyoscyamine 0.125 MG SL tablet  Commonly known as:  LEVSIN SL  Place 0.125 mg under the tongue every 4 (four) hours as needed. For abd pain.     multivitamin tablet  Take 1 tablet by mouth daily. For supplement     olmesartan 20 MG tablet  Commonly known as:  BENICAR  Take 10 mg by mouth daily. For HTN     potassium chloride SA 20 MEQ tablet  Commonly known as:  K-DUR,KLOR-CON  Take 20 mEq by mouth daily. Potassium supplement     rosuvastatin 10 MG tablet  Commonly  known as:  CRESTOR  - Take 10-20 mg by mouth See admin instructions. Alternating 10mg  and 20mg  daily.  - For hyperlipidemia        No orders of the defined types were placed in this encounter.    Immunization History  Administered Date(s) Administered  . Influenza,inj,Quad PF,36+ Mos 10/16/2013  . PPD Test 02/12/2014  . Pneumococcal Conjugate-13 12/20/2013    History  Substance Use Topics  . Smoking status: Never Smoker   . Smokeless tobacco: Never Used  . Alcohol Use: Yes     Comment: very rare    Family history is noncontributory    Review of Systems  DATA OBTAINED: from patient; pt c/o continued back pain GENERAL: Feels well no fevers, fatigue, appetite changes SKIN: No itching, rash or wounds EYES: No eye pain, redness, discharge EARS: No earache, tinnitus, change in hearing NOSE: No congestion, drainage or bleeding  MOUTH/THROAT: No mouth or tooth pain RESPIRATORY: No cough, wheezing, SOB CARDIAC: No chest pain, palpitations, lower extremity edema  GI: No abdominal pain, No N/V/D or constipation, No heartburn or reflux  GU: No dysuria, frequency or urgency, or incontinence  MUSCULOSKELETAL: No unrelieved bone/joint pain NEUROLOGIC: No headache, dizziness or focal weakness PSYCHIATRIC: admits anxious  Filed Vitals:   02/23/14 2117  BP: 142/58  Pulse: 88  Temp: 98.5 F (36.9 C)  Resp: 20    Physical Exam  GENERAL APPEARANCE: Alert, conversant. Appropriately groomed obese; appears in great distress SKIN: No diaphoresis r HEAD: Normocephalic, atraumatic  EYES: Conjunctiva/lids clear. Pupils round, reactive. EOMs intact.  EARS: External exam WNL, canals clear. Hearing grossly normal.  NOSE: No deformity or discharge.  MOUTH/THROAT: Lips w/o lesions. RESPIRATORY: Breathing is even, unlabored. Lung sounds are clear   CARDIOVASCULAR: Heart RRR no murmurs, rubs or gallops. No peripheral edema.   GASTROINTESTINAL: Abdomen is soft, non-tender, not distended  w/ normal bowel sounds GENITOURINARY: Bladder non tender, not distended  MUSCULOSKELETAL: No abnormal joints or musculature NEUROLOGIC: Oriented X3. Cranial nerves 2-12 grossly intact. Moves all extremities no tremor. PSYCHIATRIC: large emotional overlay to back pain  Patient Active Problem List   Diagnosis Date Noted  . S/P spinal fusion 02/23/2014  . Acute blood loss anemia 02/23/2014  . Chronic pain associated with significant psychosocial dysfunction 02/23/2014  . Preoperative evaluation of a medical condition to rule out surgical contraindications (TAR required) 10/09/2013  . History of IBS 01/26/2012  . 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  EDEMA 06/25/2010  . DEGENERATIVE JOINT DISEASE 04/08/2010  . OTHER DISORDERS OF NERVOUS SYSTEM&SENSE ORGANS 04/08/2010  . HYPERCHOLESTEROLEMIA  IIA 04/08/2009  . Obesity (BMI 30.0-34.9) 04/04/2009  . CIRCADIAN RHYTHM SLEEP D/O DELAY SLEEP PHSE TYPE 04/04/2009  . CARPAL TUNNEL SYNDROME 04/04/2009  . CAD, UNSPECIFIED SITE 04/04/2009  . DIASTOLIC HEART FAILURE, CHRONIC 04/04/2009  . DIARRHEA 01/17/2009  . IRRITABLE BOWEL SYNDROME 11/13/2008  . ESOPHAGEAL STRICTURE 11/12/2008  . GERD 11/12/2008  . DUODENITIS, WITHOUT HEMORRHAGE 11/12/2008  . Diaphragmatic Hernia without Mention of Obstruction or Gangrene 11/12/2008  . DIVERTICULOSIS, COLON 11/12/2008  . COLONIC POLYPS, HX OF 11/12/2008  . OBSTRUCTIVE SLEEP APNEA 05/23/2008  . DYSPNEA 05/23/2008  . HYPERLIPIDEMIA 05/22/2008  . DEPRESSION 05/22/2008  . HYPERTENSION 05/22/2008  . INSOMNIA 05/22/2008    CBC    Component Value Date/Time   WBC 6.5 12/22/2013 0923   WBC 6.2 03/07/2011 1436   RBC 4.2 12/22/2013 0923   RBC 4.28 03/07/2011 1436   HGB 12.5 12/22/2013 0923   HGB 11.9* 09/04/2011 0928   HCT 38.7 12/22/2013 0923   HCT 37.0 03/07/2011 1526   PLT 187 03/07/2011 1436   MCV 91.1 12/22/2013 0923   MCV 87.4 03/07/2011 1436   LYMPHSABS 2.1 03/07/2011 1436   MONOABS 0.4 03/07/2011 1436    EOSABS 0.0 03/07/2011 1436   BASOSABS 0.0 03/07/2011 1436    CMP     Component Value Date/Time   NA 141 12/20/2013 1231   NA 139 05/11/2013 1536   K 4.3 12/20/2013 1231   CL 99 12/20/2013 1231   CO2 29 12/20/2013 1231   GLUCOSE 112* 12/20/2013 1231   GLUCOSE 98 05/11/2013 1536   BUN 14 12/20/2013 1231   BUN 13 05/11/2013 1536   CREATININE 0.81 12/20/2013 1231   CREATININE 0.88 05/11/2013 1536   CALCIUM 9.2 12/20/2013 1231   PROT 6.1 12/20/2013 1231   PROT 6.2 05/11/2013 1536   ALBUMIN 3.9 05/11/2013 1536   AST 22 12/20/2013 1231   ALT 22 12/20/2013 1231   ALKPHOS 68 12/20/2013 1231   BILITOT 0.3 12/20/2013 1231   GFRNONAA 72 12/20/2013 1231   GFRAA 83 12/20/2013 1231    Assessment and Plan  S/P spinal fusion For lumbar scoliosis and chronic pain; pt is having trouble with pain and pain meds are being reworked today to [employer] coverage within pt's allergies  Acute blood loss anemia Discharge Hb was 9.3; will follow  Chronic pain associated with significant psychosocial dysfunction Would agree with this;pt is already on prozac 40 mg;psych consult  Obesity (BMI 30.0-34.9) Correct diet and rehab;s/p surgery hopefully pain will br dec and acticity can be inc  OBSTRUCTIVE SLEEP APNEA Reported pt can not tolerate Cpap or Bipap  HYPERTENSION Benicar, lasix, norvasc to continue  GERD Continue nexium 40 mg  IRRITABLE BOWEL SYNDROME levsin and lomotil prn  HYPERLIPIDEMIA Pt on crestor and welchol with an LDL of 65; LDL goal < 70 sec to non critical at this point) CAD    Hennie Duos, MD

## 2014-02-23 NOTE — Assessment & Plan Note (Signed)
levsin and lomotil prn

## 2014-02-23 NOTE — Assessment & Plan Note (Signed)
Continue nexium 40 mg

## 2014-02-23 NOTE — Assessment & Plan Note (Signed)
Pt on crestor and welchol with an LDL of 65; LDL goal < 70 sec to non critical at this point) CAD

## 2014-02-26 ENCOUNTER — Other Ambulatory Visit: Payer: Self-pay | Admitting: *Deleted

## 2014-02-26 MED ORDER — AMBULATORY NON FORMULARY MEDICATION: Status: DC

## 2014-02-26 NOTE — Telephone Encounter (Signed)
[facility] 

## 2014-03-06 ENCOUNTER — Other Ambulatory Visit: Payer: Self-pay | Admitting: *Deleted

## 2014-03-06 MED ORDER — HYDROCODONE-ACETAMINOPHEN 10-325 MG PO TABS: ORAL_TABLET | ORAL | Status: DC

## 2014-03-06 NOTE — Telephone Encounter (Signed)
[facility] 

## 2014-03-15 ENCOUNTER — Encounter: Payer: Self-pay | Admitting: Adult Health

## 2014-03-15 ENCOUNTER — Non-Acute Institutional Stay (SKILLED_NURSING_FACILITY): Payer: Medicare Other | Admitting: Adult Health

## 2014-03-15 DIAGNOSIS — D62 Acute posthemorrhagic anemia: Secondary | ICD-10-CM

## 2014-03-15 DIAGNOSIS — G894 Chronic pain syndrome: Secondary | ICD-10-CM

## 2014-03-15 DIAGNOSIS — E785 Hyperlipidemia, unspecified: Secondary | ICD-10-CM

## 2014-03-15 DIAGNOSIS — K219 Gastro-esophageal reflux disease without esophagitis: Secondary | ICD-10-CM

## 2014-03-15 DIAGNOSIS — I10 Essential (primary) hypertension: Secondary | ICD-10-CM

## 2014-03-15 DIAGNOSIS — G4700 Insomnia, unspecified: Secondary | ICD-10-CM

## 2014-03-15 DIAGNOSIS — F329 Major depressive disorder, single episode, unspecified: Secondary | ICD-10-CM

## 2014-03-15 DIAGNOSIS — F3289 Other specified depressive episodes: Secondary | ICD-10-CM

## 2014-03-15 DIAGNOSIS — Z981 Arthrodesis status: Secondary | ICD-10-CM

## 2014-03-15 NOTE — Progress Notes (Signed)
Patient ID: [name] [name], female   DOB: [birth year], 73 y.o.   MRN: [id number]                 PROGRESS NOTE  DATE: 03/15/14  FACILITY: Nursing Home Location: [facility]  LEVEL OF CARE: SNF (31)  Acute Visit  CHIEF COMPLAINT: Discharge Notes  HISTORY OF PRESENT ILLNESS: This is a 73 year old female who is for discharge home with Home health PT, OT and Nursing. DME: 3-in-1 commode. She has been admitted to [facility] on 02/12/14 from [facility] with Lumbar degenerative Scoliosis and chronic back pain S/P Spinal fusion. Patient was admitted to this facility for short-term rehabilitation after the patient's recent hospitalization.  Patient has completed SNF rehabilitation and therapy has cleared the patient for discharge.  REASSESSMENT OF ONGOING PROBLEM(S):  HTN: Pt 's HTN remains stable.  Denies CP, sob, DOE, pedal edema, headaches, dizziness or visual disturbances.  No complications from the medications currently being used.  Last BP : 114/60  INSOMNIA: The insomnia remains stable.  No complications noted from the medications presently being used. Patient denies ongoing insomnia, pain, hallucinations, delusions.Marland Kitchen  HYPERLIPIDEMIA: No complications from the medications presently being used.   PAST MEDICAL HISTORY : Reviewed.  No changes.  CURRENT MEDICATIONS: Reviewed per [facility]  REVIEW OF SYSTEMS:  GENERAL: no change in appetite, no fatigue, no weight changes, no fever, chills or weakness RESPIRATORY: no cough, SOB, DOE, wheezing, hemoptysis CARDIAC: no chest pain, edema or palpitations GI: no abdominal pain, diarrhea, constipation, heart burn, nausea or vomiting  PHYSICAL EXAMINATION  GENERAL: no acute distress, normal body habitus SKIN: surgical incision on back is healed EYES: conjunctivae normal, sclerae normal, normal eye lids NECK: supple, trachea midline, no neck masses, no thyroid tenderness, no thyromegaly LYMPHATICS: no  LAN in the neck, no supraclavicular LAN RESPIRATORY: breathing is even & unlabored, BS CTAB CARDIAC: RRR, no murmur,no extra heart sounds, no edema GI: abdomen soft, normal BS, no masses, no tenderness, no hepatomegaly, no splenomegaly EXTREMITIES:  Ambulates with walker PSYCHIATRIC: the patient is alert & oriented to person, affect and behavior are appropriate  LABS/RADIOLOGY: 3/10./15  WBC 5.6 hemoglobin 9.3 hematocrit 31.4 sodium 139 potassium 4.1 and glucose 134  BUN 13 creatinine 1.1 calcium 8.9 02/15/14  sodium 144 potassium 3.2 glucose 141 BUN 12 creatinine 1.5 calcium 8.0 02/13/14  WBC 7.5 hemoglobin 8.0 hematocrit 35.9 Labs reviewed: Basic Metabolic Panel:  Recent Labs  05/11/13 1536 09/07/13 1227 12/20/13 1231  NA 139 140 141  K 4.9 4.4 4.3  CL 103 98 99  CO2 30 28 29   GLUCOSE 98 93 112*  BUN 13 14 14   CREATININE 0.88 0.81 0.81  CALCIUM 9.3 9.3 9.2   Liver Function Tests:  Recent Labs  04/13/13 1320 05/11/13 1536 09/07/13 1227 12/20/13 1231  AST 25 29 20 22   ALT 19 31 18 22   ALKPHOS 67 60 72 68  BILITOT 0.5 0.4 0.4 0.3  PROT 6.3 6.2 6.0 6.1  ALBUMIN 3.8 3.9  --   --    CBC:  Recent Labs  05/11/13 1546 09/07/13 1250 12/22/13 0923  WBC 7.5 7.0 6.5  HGB 12.3 12.5 12.5  HCT 36.9* 37.0* 38.7  MCV 91.8 89.3 91.1    ASSESSMENT/PLAN:  Lumbar Degenerative scoliosis status post spinal fusion - for home health PT, OT and nursing Hypertension - well controlled; continue Amlodipine and Benicar Hyperlipidemia - continue WelChol and Crestor GERD - continue Prilosec Insomnia -  continue Lunesta Chronic back pain -  continue Duragesic patch and Norco Depression - continue Prozac Anemia - continue FeSO4   I have filled out patient's discharge paperwork and written prescriptions.  Patient will receive home health PT, OT and Nursing. DME provided: 3-in-1 commode  Total discharge time: Greater than 30 minutes Discharge time involved coordination of the  discharge process with social worker, nursing staff and therapy department. Medical justification for home health services/DME verified.   CPT CODE: 89211  Seth Bake - NP [facility] [phone]

## 2014-03-15 NOTE — Progress Notes (Signed)
Patient ID: [name] [name], female   DOB: June 24, 1941, 73 y.o.   MRN: [id number]               PROGRESS NOTE  DATE: 02/14/2014  FACILITY: Nursing Home Location: [facility]  LEVEL OF CARE: SNF (31)  Acute Visit  CHIEF COMPLAINT: Follow-up Hospitalization  HISTORY OF PRESENT ILLNESS: This is a 73 year old female who has been admitted to [facility] on 02/12/14 from [facility] with Lumbar degenerative Scoliosis and chronic back pain S/P Spinal fusion. She has been admitted for a short-term rehabilitation.  REASSESSMENT OF ONGOING PROBLEM(S):  HTN: Pt 's HTN remains stable.  Denies CP, sob, DOE, pedal edema, headaches, dizziness or visual disturbances.  No complications from the medications currently being used.  Last BP : 129/69  DEPRESSION: The depression remains stable. Patient denies ongoing feelings of sadness, insomnia, anedhonia or lack of appetite. No complications reported from the medications currently being used. Staff do not report behavioral problems.  GERD: pt's GERD is stable.  Denies ongoing heartburn, abd. Pain, nausea or vomiting.  Currently on a PPI & tolerates it without any adverse reactions.  PAST MEDICAL HISTORY : Reviewed.  No changes.  CURRENT MEDICATIONS: Reviewed per [facility]  REVIEW OF SYSTEMS:  GENERAL: no change in appetite, no fatigue, no weight changes, no fever, chills or weakness RESPIRATORY: no cough, SOB, DOE, wheezing, hemoptysis CARDIAC: no chest pain, edema or palpitations GI: no abdominal pain, diarrhea, constipation, heart burn, nausea or vomiting  PHYSICAL EXAMINATION  GENERAL: no acute distress, normal body habitus SKIN: surgical incision on back is dry, no redness EYES: conjunctivae normal, sclerae normal, normal eye lids NECK: supple, trachea midline, no neck masses, no thyroid tenderness, no thyromegaly LYMPHATICS: no LAN in the neck, no supraclavicular LAN RESPIRATORY: breathing is even &  unlabored, BS CTAB CARDIAC: RRR, no murmur,no extra heart sounds, no edema GI: abdomen soft, normal BS, no masses, no tenderness, no hepatomegaly, no splenomegaly PSYCHIATRIC: the patient is alert & oriented to person, screaming with back pain  LABS/RADIOLOGY: Labs reviewed: Basic Metabolic Panel:  Recent Labs  05/11/13 1536 09/07/13 1227 12/20/13 1231  NA 139 140 141  K 4.9 4.4 4.3  CL 103 98 99  CO2 30 28 29   GLUCOSE 98 93 112*  BUN 13 14 14   CREATININE 0.88 0.81 0.81  CALCIUM 9.3 9.3 9.2   Liver Function Tests:  Recent Labs  04/13/13 1320 05/11/13 1536 09/07/13 1227 12/20/13 1231  AST 25 29 20 22   ALT 19 31 18 22   ALKPHOS 67 60 72 68  BILITOT 0.5 0.4 0.4 0.3  PROT 6.3 6.2 6.0 6.1  ALBUMIN 3.8 3.9  --   --    CBC:  Recent Labs  05/11/13 1546 09/07/13 1250 12/22/13 0923  WBC 7.5 7.0 6.5  HGB 12.3 12.5 12.5  HCT 36.9* 37.0* 38.7  MCV 91.8 89.3 91.1    ASSESSMENT/PLAN:  Lumbar Degenerative scoliosis status post spinal fusion - for rehabilitation; remove staples from surgical back today Hypertension - well controlled; continue Amlodipine and Benicar Hyperlipidemia - continue WelChol and Crestor GERD - continue Nexium Insomnia - continue Lunesta Chronic back pain -  increase Duragesic 50 mcg/hr patch  Q 48 hours and add Norco 10/325 mg 1 tab by mouth twice a day Norco 10/325 mg 1 tab by mouth every 6 hours when necessary; start Robaxin 500 mg 1 tab by mouth every 6 hours when necessary  Depression - continue Prozac  Anemia - start FeSO4 325 mg PO BID   CPT CODE: 53794  [name] [name] - NP [facility] [phone]

## 2014-03-20 ENCOUNTER — Telehealth: Payer: Self-pay | Admitting: *Deleted

## 2014-03-20 MED ORDER — AZITHROMYCIN 250 MG PO TABS: 250.0000 mg | ORAL_TABLET | ORAL | Status: DC

## 2014-03-20 NOTE — Telephone Encounter (Signed)
Patient recently released from rehabilitation after undergoing back surgery. She is unable to come in to the office at this time but complains of chest congestion and is requesting an antibiotic.  Discussed with Dr. Laurance Flatten and he ordered a Zpak and suggested Mucinex OTC. Patient's daughter-in-law made aware and she will pass on this information.

## 2014-03-21 DIAGNOSIS — J189 Pneumonia, unspecified organism: Secondary | ICD-10-CM

## 2014-03-21 HISTORY — DX: Pneumonia, unspecified organism: J18.9

## 2014-03-27 ENCOUNTER — Ambulatory Visit (INDEPENDENT_AMBULATORY_CARE_PROVIDER_SITE_OTHER): Payer: Medicare Other | Admitting: Family Medicine

## 2014-03-27 ENCOUNTER — Ambulatory Visit (INDEPENDENT_AMBULATORY_CARE_PROVIDER_SITE_OTHER): Payer: Medicare Other

## 2014-03-27 VITALS — BP 134/82 | HR 84 | Temp 96.3°F

## 2014-03-27 DIAGNOSIS — R059 Cough, unspecified: Secondary | ICD-10-CM

## 2014-03-27 DIAGNOSIS — R0609 Other forms of dyspnea: Secondary | ICD-10-CM

## 2014-03-27 DIAGNOSIS — R0603 Acute respiratory distress: Secondary | ICD-10-CM

## 2014-03-27 DIAGNOSIS — R062 Wheezing: Secondary | ICD-10-CM

## 2014-03-27 DIAGNOSIS — R0989 Other specified symptoms and signs involving the circulatory and respiratory systems: Secondary | ICD-10-CM

## 2014-03-27 DIAGNOSIS — R05 Cough: Secondary | ICD-10-CM

## 2014-03-27 DIAGNOSIS — J189 Pneumonia, unspecified organism: Secondary | ICD-10-CM | POA: Insufficient documentation

## 2014-03-27 MED ORDER — LEVALBUTEROL HCL 1.25 MG/0.5ML IN NEBU: 1.2500 mg | INHALATION_SOLUTION | Freq: Once | RESPIRATORY_TRACT | Status: DC

## 2014-03-27 NOTE — Progress Notes (Signed)
   Subjective:    Patient ID: [name] [name], female    DOB: Aug 21, 1941, 73 y.o.   MRN: [id number]  HPI This 73 y.o. female presents for evaluation of shortness of breath and severe pain in her Side and back.  She is anxious and she is accompanied by her daughters.  She had back surgery Over a month ago and she was dc'd to rehab where she has recently been dc'd home.  She is having uri sx's and she is coughing and she is SOB.   Review of Systems C/o severe back and side pain, sob, cough, and uri sx's No chest pain, SOB, HA, dizziness, vision change, N/V, diarrhea, constipation, dysuria, urinary urgency or frequency, myalgias, arthralgias or rash.     Objective:   Physical Exam Vital signs noted  Anxious appearing female in respiratory distress.  HEENT - Head atraumatic Normocephalic                Eyes - PERRLA, Conjuctiva - clear Sclera- Clear EOMI                Ears - EAC's Wnl TM's Wnl Gross Hearing WNL                 Throat - oropharanx wnl Respiratory - Lungs with diffuse expiratory wheezes throughout  sonorohous rhonchi bilateral.  Oxygen sat 98% on room air Cardiac - RRR S1 and S2 without murmur GI - Abdomen soft Nontender and bowel sounds active x 4 MS - TTP bilateral lateral thorax       Assessment & Plan:  Cough - Plan: DG Chest 2 View Neb tx   Wheezing - Plan: DG Chest 2  View neb tx  Respiratory distress - Neb tx given immediately and patient does cough up copious amount of  Clear white resp secretions but is still tight and wheezing bilateral.  She is very Anxious and dyspniec and c/o severe pain in back and rib cage.  Discussed with patient that She is at risk of having respiratory failure and needs to go to ED and have labs done And she needs assistance with pulmonary toileting and adequate pain control.  911 is called and she Will be taken to the ED at [place]

## 2014-03-30 MED ORDER — LEVALBUTEROL HCL 1.25 MG/3ML IN NEBU
1.2500 mg | INHALATION_SOLUTION | Freq: Once | RESPIRATORY_TRACT | Status: AC
  Administered 2014-03-27: 1.25 mg via RESPIRATORY_TRACT

## 2014-03-30 NOTE — Addendum Note (Signed)
Addended by: Marin Olp on: 03/30/2014 05:08 PM   Modules accepted: Orders

## 2014-04-04 ENCOUNTER — Encounter: Payer: Self-pay | Admitting: Family Medicine

## 2014-04-04 ENCOUNTER — Ambulatory Visit (INDEPENDENT_AMBULATORY_CARE_PROVIDER_SITE_OTHER): Payer: Medicare Other | Admitting: Family Medicine

## 2014-04-04 VITALS — BP 159/98 | HR 68 | Temp 98.5°F | Ht 66.0 in | Wt 195.0 lb

## 2014-04-04 DIAGNOSIS — Z9889 Other specified postprocedural states: Secondary | ICD-10-CM

## 2014-04-04 DIAGNOSIS — F411 Generalized anxiety disorder: Secondary | ICD-10-CM

## 2014-04-04 DIAGNOSIS — R030 Elevated blood-pressure reading, without diagnosis of hypertension: Secondary | ICD-10-CM

## 2014-04-04 DIAGNOSIS — J189 Pneumonia, unspecified organism: Secondary | ICD-10-CM

## 2014-04-04 LAB — POCT CBC
GRANULOCYTE PERCENT: 73.5 % (ref 37–80)
HEMATOCRIT: 35.2 % — AB (ref 37.7–47.9)
HEMOGLOBIN: 11 g/dL — AB (ref 12.2–16.2)
Lymph, poc: 1.9 (ref 0.6–3.4)
MCH: 28.2 pg (ref 27–31.2)
MCHC: 31.2 g/dL — AB (ref 31.8–35.4)
MCV: 90.3 fL (ref 80–97)
MPV: 8.1 fL (ref 0–99.8)
PLATELET COUNT, POC: 231 10*3/uL (ref 142–424)
POC Granulocyte: 5.7 (ref 2–6.9)
POC LYMPH PERCENT: 24.1 %L (ref 10–50)
RBC: 3.9 M/uL — AB (ref 4.04–5.48)
RDW, POC: 15.2 %
WBC: 7.7 10*3/uL (ref 4.6–10.2)

## 2014-04-04 NOTE — Patient Instructions (Signed)
Continue to practice deep breathing Continue to walk as much as possible Continue to be careful and not fall Check blood pressures regularly at home Review these readings in 10-14 day Avoid sodium intake as much as possible At your next visit we will repeat the chest x-ray Continue Mucinex as needed for cough

## 2014-04-04 NOTE — Progress Notes (Signed)
Subjective:    Patient ID: [name] [name], female    DOB: [birth year], 73 y.o.   MRN: [id number]  HPI Patient here today for [facility]. Patient is accompanied today by her daughter. The patient has had recent back surgery with a long recuperation followed by a healthcare associated pneumonia for which she is here for followup today she has completed her Levaquin. We will check a CBC and BMP and we will have her come back in about 3 weeks for a chest x-ray. She is still having some back pain and congestion but she is improving. Temperature note that the patient has an additional stress fracture adjacent to the rod placement. She is going to [facility] and it will be decided be done about his stress fracture. She indicates she is breathing better. Her blood pressure is elevated today and before we had the Lasix back to her treatment regimen we will review some home blood pressures first. The family will bring these blood pressure readings in for review in 10-14 days.      Patient Active Problem List   Diagnosis Date Noted  . S/P spinal fusion 02/23/2014  . Acute blood loss anemia 02/23/2014  . Chronic pain associated with significant psychosocial dysfunction 02/23/2014  . Preoperative evaluation of a medical condition to rule out surgical contraindications (TAR required) 10/09/2013  . History of IBS 01/26/2012  . 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 EDEMA 06/25/2010  . DEGENERATIVE JOINT DISEASE 04/08/2010  . OTHER DISORDERS OF NERVOUS SYSTEM&SENSE ORGANS 04/08/2010  . HYPERCHOLESTEROLEMIA  IIA 04/08/2009  . Obesity (BMI 30.0-34.9) 04/04/2009  . CIRCADIAN RHYTHM SLEEP D/O DELAY SLEEP PHSE TYPE 04/04/2009  . CARPAL TUNNEL SYNDROME 04/04/2009  . CAD, UNSPECIFIED SITE 04/04/2009  . DIASTOLIC HEART FAILURE, CHRONIC 04/04/2009  . DIARRHEA 01/17/2009  . IRRITABLE BOWEL SYNDROME 11/13/2008  . ESOPHAGEAL STRICTURE 11/12/2008  . GERD  11/12/2008  . DUODENITIS, WITHOUT HEMORRHAGE 11/12/2008  . Diaphragmatic Hernia without Mention of Obstruction or Gangrene 11/12/2008  . DIVERTICULOSIS, COLON 11/12/2008  . COLONIC POLYPS, HX OF 11/12/2008  . OBSTRUCTIVE SLEEP APNEA 05/23/2008  . DYSPNEA 05/23/2008  . HYPERLIPIDEMIA 05/22/2008  . DEPRESSION 05/22/2008  . HYPERTENSION 05/22/2008  . INSOMNIA 05/22/2008   Outpatient Encounter Prescriptions as of 04/04/2014  Medication Sig  . ALPRAZolam (XANAX) 0.25 MG tablet Take 1 tablet (0.25 mg total) by mouth at bedtime as needed for anxiety.  Marland Kitchen amLODipine (NORVASC) 2.5 MG tablet Take 1 tablet by mouth  daily. For HTN  . aspirin EC 81 MG tablet Take 81 mg by mouth daily. For prophylasxis  . Calcium Carbonate-Vitamin D (CALCIUM-VITAMIN D) 500-200 MG-UNIT per tablet Take 1 tablet by mouth daily.  . colesevelam (WELCHOL) 625 MG tablet Take 1,875 mg by mouth 2 (two) times daily with a meal. For hyperlipidemia  . diphenoxylate-atropine (LOMOTIL) 2.5-0.025 MG per tablet Take 1 tablet by mouth 4 (four) times daily as needed for diarrhea or loose stools.  Marland Kitchen esomeprazole (NEXIUM) 40 MG capsule Take 40 mg by mouth daily before breakfast. For GERD  . Eszopiclone 3 MG TABS Take 3 mg by mouth at bedtime. For insomnia,take immediately before bedtime.  . fentaNYL (DURAGESIC - DOSED MCG/HR) 50 MCG/HR 75 mcg. Apply 1 patch every 72 hours *REOMVE OLD PATCH* EXTERNAL USE ONLY *ROTATE SITES*CHECK PLACEMENT OF PATCH EVERY SHIFT*  . ferrous sulfate 325 (65 FE) MG tablet Take 325 mg by mouth 2 (two) times daily.  Marland Kitchen FLUoxetine (PROZAC) 40  MG capsule Take 40 mg by mouth daily. For depression  . furosemide (LASIX) 40 MG tablet Take 40 mg by mouth daily. For HTN/ Edema  . HYDROmorphone (DILAUDID) 2 MG tablet Take 2 mg by mouth every 4 (four) hours as needed for severe pain.  . hyoscyamine (LEVSIN SL) 0.125 MG SL tablet Place 0.125 mg under the tongue every 4 (four) hours as needed. For abd pain.  . methocarbamol  (ROBAXIN) 500 MG tablet Take 500 mg by mouth every 6 (six) hours as needed for muscle spasms.  . Multiple Vitamin (MULTIVITAMIN) tablet Take 1 tablet by mouth daily. For supplement  . olmesartan (BENICAR) 20 MG tablet Take 10 mg by mouth daily. For HTN  . omeprazole (PRILOSEC) 20 MG capsule Take 20 mg by mouth daily.  . polyethylene glycol (MIRALAX / GLYCOLAX) packet Take 17 g by mouth daily.  . potassium chloride SA (K-DUR,KLOR-CON) 20 MEQ tablet Take 20 mEq by mouth daily. Potassium supplement  . rosuvastatin (CRESTOR) 10 MG tablet Take 10-20 mg by mouth See admin instructions. Alternating 68m and 216mdaily. For hyperlipidemia  . [DISCONTINUED] azithromycin (ZITHROMAX) 250 MG tablet Take 1 tablet (250 mg total) by mouth as directed.  . [DISCONTINUED] fentaNYL (DURAGESIC - DOSED MCG/HR) 12 MCG/HR Place 12.5 mcg onto the skin every 3 (three) days. Together with Fentanyl 50 mcg/hr q 72 hours  . [DISCONTINUED] fentaNYL (DURAGESIC - DOSED MCG/HR) 50 MCG/HR Apply 1 patch every 72 hours *REOMVE OLD PATCH* EXTERNAL USE ONLY *ROTATE SITES*CHECK PLACEMENT OF PATCH EVERY SHIFT*  . [DISCONTINUED] HYDROcodone-acetaminophen (NORCO) 10-325 MG per tablet Take one tablet by mouth every night at bedtime; Take one tablet by mouth every 6 hours as needed for pain    Review of Systems  Constitutional: Negative.  Negative for fever.  HENT: Positive for congestion.   Eyes: Negative.   Respiratory: Positive for cough (little).   Cardiovascular: Negative.   Gastrointestinal: Negative.   Endocrine: Negative.   Genitourinary: Negative.   Musculoskeletal: Positive for back pain and myalgias.  Skin: Negative.   Allergic/Immunologic: Negative.   Neurological: Positive for tremors.  Hematological: Negative.   Psychiatric/Behavioral: Negative.        Objective:   Physical Exam  Nursing note and vitals reviewed. Constitutional: She is oriented to person, place, and time. She appears well-developed and  well-nourished. She appears distressed.  HENT:  Head: Normocephalic and atraumatic.  Right Ear: External ear normal.  Left Ear: External ear normal.  Nose: Nose normal.  Mouth/Throat: Oropharynx is clear and moist.  Eyes: Conjunctivae and EOM are normal. Pupils are equal, round, and reactive to light. Right eye exhibits no discharge. Left eye exhibits no discharge. No scleral icterus.  Neck: Normal range of motion. Neck supple. No thyromegaly present.  Cardiovascular: Normal rate, regular rhythm, normal heart sounds and intact distal pulses.   No murmur heard. Pulmonary/Chest: Effort normal and breath sounds normal. No respiratory distress. She has no wheezes. She has no rales. She exhibits no tenderness.  Abdominal: Soft. Bowel sounds are normal. She exhibits no mass.  Musculoskeletal: Normal range of motion. She exhibits edema (minimal pedal edema). She exhibits no tenderness.  Lymphadenopathy:    She has no cervical adenopathy.  Neurological: She is alert and oriented to person, place, and time.  Skin: Skin is warm and dry. No rash noted.  Surgical wound healing well  Psychiatric: She has a normal mood and affect. Her behavior is normal. Judgment and thought content normal.  Depressed affect   BP  159/98  Pulse 68  Temp(Src) 98.5 F (36.9 C) (Oral)  Ht 5' 6"  (1.676 m)  Wt 195 lb (88.451 kg)  BMI 31.49 kg/m2        Assessment & Plan:  1. Pneumonia - POCT CBC - BMP8+EGFR  2. Status post lumbar surgery  3. Anxiety state, unspecified  4. Increased blood pressure  Patient Instructions  Continue to practice deep breathing Continue to walk as much as possible Continue to be careful and not fall Check blood pressures regularly at home Review these readings in 10-14 day Avoid sodium intake as much as possible At your next visit we will repeat the chest x-ray Continue Mucinex as needed for cough   Arrie Senate MD

## 2014-04-05 LAB — BMP8+EGFR
BUN / CREAT RATIO: 9 — AB (ref 11–26)
BUN: 7 mg/dL — AB (ref 8–27)
CO2: 22 mmol/L (ref 18–29)
Calcium: 9 mg/dL (ref 8.7–10.3)
Chloride: 102 mmol/L (ref 97–108)
Creatinine, Ser: 0.8 mg/dL (ref 0.57–1.00)
GFR calc Af Amer: 84 mL/min/{1.73_m2} (ref >59)
GFR, EST NON AFRICAN AMERICAN: 73 mL/min/{1.73_m2} (ref >59)
Glucose: 97 mg/dL (ref 65–99)
Potassium: 3.5 mmol/L (ref 3.5–5.2)
SODIUM: 141 mmol/L (ref 134–144)

## 2014-04-13 ENCOUNTER — Ambulatory Visit: Payer: Medicare Other | Admitting: Cardiology

## 2014-04-17 ENCOUNTER — Ambulatory Visit: Payer: Medicare Other | Admitting: Family Medicine

## 2014-04-24 ENCOUNTER — Ambulatory Visit (INDEPENDENT_AMBULATORY_CARE_PROVIDER_SITE_OTHER): Payer: Medicare Other | Admitting: Family Medicine

## 2014-04-24 ENCOUNTER — Ambulatory Visit (INDEPENDENT_AMBULATORY_CARE_PROVIDER_SITE_OTHER): Payer: Medicare Other

## 2014-04-24 ENCOUNTER — Encounter: Payer: Self-pay | Admitting: Family Medicine

## 2014-04-24 VITALS — BP 101/58 | HR 69 | Temp 99.5°F | Ht 66.0 in | Wt 190.0 lb

## 2014-04-24 DIAGNOSIS — K219 Gastro-esophageal reflux disease without esophagitis: Secondary | ICD-10-CM

## 2014-04-24 DIAGNOSIS — I10 Essential (primary) hypertension: Secondary | ICD-10-CM

## 2014-04-24 DIAGNOSIS — M47817 Spondylosis without myelopathy or radiculopathy, lumbosacral region: Secondary | ICD-10-CM

## 2014-04-24 DIAGNOSIS — E8881 Metabolic syndrome: Secondary | ICD-10-CM

## 2014-04-24 DIAGNOSIS — E559 Vitamin D deficiency, unspecified: Secondary | ICD-10-CM

## 2014-04-24 DIAGNOSIS — E785 Hyperlipidemia, unspecified: Secondary | ICD-10-CM

## 2014-04-24 DIAGNOSIS — I2510 Atherosclerotic heart disease of native coronary artery without angina pectoris: Secondary | ICD-10-CM

## 2014-04-24 DIAGNOSIS — J189 Pneumonia, unspecified organism: Secondary | ICD-10-CM

## 2014-04-24 DIAGNOSIS — M47816 Spondylosis without myelopathy or radiculopathy, lumbar region: Secondary | ICD-10-CM

## 2014-04-24 HISTORY — DX: Metabolic syndrome: E88.810

## 2014-04-24 HISTORY — DX: Metabolic syndrome: E88.81

## 2014-04-24 LAB — POCT CBC
Granulocyte percent: 59.6 %G (ref 37–80)
HCT, POC: 44.9 % (ref 37.7–47.9)
HEMOGLOBIN: 14 g/dL (ref 12.2–16.2)
LYMPH, POC: 1.7 (ref 0.6–3.4)
MCH: 27.5 pg (ref 27–31.2)
MCHC: 31.2 g/dL — AB (ref 31.8–35.4)
MCV: 88 fL (ref 80–97)
MPV: 9.2 fL (ref 0–99.8)
PLATELET COUNT, POC: 176 10*3/uL (ref 142–424)
POC GRANULOCYTE: 2.7 (ref 2–6.9)
POC LYMPH %: 37.3 % (ref 10–50)
RBC: 5.1 M/uL (ref 4.04–5.48)
RDW, POC: 14.2 %
WBC: 4.5 10*3/uL — AB (ref 4.6–10.2)

## 2014-04-24 LAB — POCT GLYCOSYLATED HEMOGLOBIN (HGB A1C): Hemoglobin A1C: 5.3

## 2014-04-24 NOTE — Progress Notes (Signed)
 Subjective:    Patient ID: [name] [name], female    DOB: 05/11/1941, 73 y.o.   MRN: [id number]  HPI Pt here for follow up and management of chronic medical problems. The patient is improving. She is walking more. She is having some swelling in her legs. She of course has had a recent bout with pneumonia but she is doing better with this.        Patient Active Problem List   Diagnosis Date Noted  . Metabolic syndrome 04/24/2014  . S/P spinal fusion 02/23/2014  . Acute blood loss anemia 02/23/2014  . Chronic pain associated with significant psychosocial dysfunction 02/23/2014  . Preoperative evaluation of a medical condition to rule out surgical contraindications (TAR required) 10/09/2013  . History of IBS 01/26/2012  . 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 EDEMA 06/25/2010  . DEGENERATIVE JOINT DISEASE 04/08/2010  . OTHER DISORDERS OF NERVOUS SYSTEM&SENSE ORGANS 04/08/2010  . HYPERCHOLESTEROLEMIA  IIA 04/08/2009  . Obesity (BMI 30.0-34.9) 04/04/2009  . CIRCADIAN RHYTHM SLEEP D/O DELAY SLEEP PHSE TYPE 04/04/2009  . CARPAL TUNNEL SYNDROME 04/04/2009  . CAD, UNSPECIFIED SITE 04/04/2009  . DIASTOLIC HEART FAILURE, CHRONIC 04/04/2009  . DIARRHEA 01/17/2009  . IRRITABLE BOWEL SYNDROME 11/13/2008  . ESOPHAGEAL STRICTURE 11/12/2008  . GERD 11/12/2008  . DUODENITIS, WITHOUT HEMORRHAGE 11/12/2008  . Diaphragmatic Hernia without Mention of Obstruction or Gangrene 11/12/2008  . DIVERTICULOSIS, COLON 11/12/2008  . COLONIC POLYPS, HX OF 11/12/2008  . OBSTRUCTIVE SLEEP APNEA 05/23/2008  . DYSPNEA 05/23/2008  . HYPERLIPIDEMIA 05/22/2008  . DEPRESSION 05/22/2008  . HYPERTENSION 05/22/2008  . INSOMNIA 05/22/2008   Outpatient Encounter Prescriptions as of 04/24/2014  Medication Sig  . albuterol (PROVENTIL HFA;VENTOLIN HFA) 108 (90 BASE) MCG/ACT inhaler Inhale into the lungs every 6 (six) hours as needed for wheezing or shortness of breath.  . ALPRAZolam (XANAX) 0.25 MG tablet Take 1  tablet (0.25 mg total) by mouth at bedtime as needed for anxiety.  . amLODipine (NORVASC) 2.5 MG tablet Take 1 tablet by mouth  daily. For HTN  . aspirin EC 81 MG tablet Take 81 mg by mouth daily. For prophylasxis  . Calcium Carbonate-Vitamin D (CALCIUM-VITAMIN D) 500-200 MG-UNIT per tablet Take 1 tablet by mouth daily.  . colesevelam (WELCHOL) 625 MG tablet Take 1,875 mg by mouth 2 (two) times daily with a meal. For hyperlipidemia  . esomeprazole (NEXIUM) 40 MG capsule Take 40 mg by mouth 2 (two) times daily before a meal. For GERD  . Eszopiclone 3 MG TABS Take 3 mg by mouth at bedtime. For insomnia,take immediately before bedtime.  . fentaNYL (DURAGESIC - DOSED MCG/HR) 50 MCG/HR 75 mcg. Apply 1 patch every 72 hours *REOMVE OLD PATCH* EXTERNAL USE ONLY *ROTATE SITES*CHECK PLACEMENT OF PATCH EVERY SHIFT*  . ferrous sulfate 325 (65 FE) MG tablet Take 325 mg by mouth 2 (two) times daily.  . FLUoxetine (PROZAC) 40 MG capsule Take 40 mg by mouth daily. For depression  . furosemide (LASIX) 40 MG tablet Take 40 mg by mouth daily. For HTN/ Edema  . HYDROmorphone (DILAUDID) 2 MG tablet Take 4 mg by mouth every 4 (four) hours as needed for severe pain.   . hyoscyamine (LEVSIN SL) 0.125 MG SL tablet Place 0.125 mg under the tongue every 4 (four) hours as needed. For abd pain.  . methocarbamol (ROBAXIN) 500 MG tablet Take 500 mg by mouth every 6 (six) hours as needed for muscle spasms.  . Multiple Vitamin (MULTIVITAMIN) tablet Take 1 tablet by mouth daily.   For supplement  . olmesartan (BENICAR) 20 MG tablet Take 10 mg by mouth daily. For HTN  . polyethylene glycol (MIRALAX / GLYCOLAX) packet Take 17 g by mouth daily.  . potassium chloride SA (K-DUR,KLOR-CON) 20 MEQ tablet Take 20 mEq by mouth daily. Potassium supplement  . rosuvastatin (CRESTOR) 10 MG tablet Take 10-20 mg by mouth See admin instructions. Alternating 46m and 220mdaily. For hyperlipidemia  . [DISCONTINUED] omeprazole (PRILOSEC) 20 MG  capsule Take 20 mg by mouth daily.  . [DISCONTINUED] levalbuterol (XOPENEX) nebulizer solution 1.25 mg     Review of Systems  Constitutional: Negative.   HENT: Negative.   Eyes: Negative.   Respiratory: Negative.   Cardiovascular: Positive for leg swelling.  Gastrointestinal: Negative.   Endocrine: Negative.   Genitourinary: Negative.   Musculoskeletal: Negative.   Skin: Negative.   Allergic/Immunologic: Negative.   Neurological: Negative.   Hematological: Negative.   Psychiatric/Behavioral: Negative.        Objective:   Physical Exam  Nursing note and vitals reviewed. Constitutional: She is oriented to person, place, and time. She appears well-developed and well-nourished. No distress.  HENT:  Head: Normocephalic and atraumatic.  Right Ear: External ear normal.  Left Ear: External ear normal.  Nose: Nose normal.  Mouth/Throat: Oropharynx is clear and moist. No oropharyngeal exudate.  Eyes: Conjunctivae and EOM are normal. Pupils are equal, round, and reactive to light. Right eye exhibits no discharge. Left eye exhibits no discharge. No scleral icterus.  Neck: Normal range of motion. Neck supple. No thyromegaly present.  Cardiovascular: Normal rate, regular rhythm, normal heart sounds and intact distal pulses.  Exam reveals no gallop and no friction rub.   No murmur heard. Regular rate and rhythm at 60 per minute  Pulmonary/Chest: Effort normal and breath sounds normal. No respiratory distress. She has no wheezes. She has no rales. She exhibits no tenderness.  The lungs were clear anteriorly and posteriorly  Abdominal: Soft. Bowel sounds are normal. She exhibits no mass. There is tenderness. There is no rebound and no guarding.  Generally tender, no masses  Musculoskeletal: Normal range of motion. She exhibits no edema and no tenderness.  20 good strength in lower extremities. Some diminished strength with flexing the lower leg. There were good pulses in both feet and skin  was intact bilaterally.  Lymphadenopathy:    She has no cervical adenopathy.  Neurological: She is alert and oriented to person, place, and time. She has normal reflexes. No cranial nerve deficit.  Skin: Skin is warm and dry. No rash noted.  Psychiatric: She has a normal mood and affect. Her behavior is normal. Judgment and thought content normal.   BP 101/58  Pulse 69  Temp(Src) 99.5 F (37.5 C) (Oral)  Ht 5' 6" (1.676 m)  Wt 190 lb (86.183 kg)  BMI 30.68 kg/m2  [facility] reading (PRIMARY) by  Dr. MoBrunilda Payor-ray-no active disease no indication of any residual pneumonia      Assessment & Plan:  1. CAD, UNSPECIFIED SITE - POCT CBC - BMP8+EGFR  2. GERD - POCT CBC  3. HYPERLIPIDEMIA - POCT CBC - NMR, lipoprofile  4. HYPERTENSION - POCT CBC - BMP8+EGFR - Hepatic function panel  5. Metabolic syndrome - POCT CBC - POCT glycosylated hemoglobin (Hb A1C)  6. Vitamin D deficiency - Vit D  25 hydroxy (rtn osteoporosis monitoring)  7. Pneumonia - DG Chest 2 View; Future  8. Osteoarthritis of lumbar spine Patient Instructions  Medicare Annual Wellness Visit  [place] and the medical providers at [facility] strive to bring you the best medical care.  In doing so we not only want to address your current medical conditions and concerns but also to detect new conditions early and prevent illness, disease and health-related problems.    Medicare offers a yearly Wellness Visit which allows our clinical staff to assess your need for preventative services including immunizations, lifestyle education, counseling to decrease risk of preventable diseases and screening for fall risk and other medical concerns.    This visit is provided free of charge (no copay) for all Medicare recipients. The clinical pharmacists at [facility] have begun to conduct these Wellness Visits which will also include a thorough review of all  your medications.    As you primary medical provider recommend that you make an appointment for your Annual Wellness Visit if you have not done so already this year.  You may set up this appointment before you leave today or you may call back ([phone]) and schedule an appointment.  Please make sure when you call that you mention that you are scheduling your Annual Wellness Visit with the clinical pharmacist so that the appointment may be made for the proper length of time.     Continue current medications. Continue good therapeutic lifestyle changes which include good diet and exercise. Fall precautions discussed with patient. If an FOBT was given today- please return it to our front desk. If you are over 50 years old - you may need Prevnar 13 or the adult Pneumonia vaccine.  Continue walking regularly and do resistance exercises with lower extremitys Continue drinking plenty of fluids Prior to decrease the quantity and frequency of pain medication Continue followup with neurosurgeon and orthopedist Continue Lasix only one half pill daily Continue to watch sodium intake   Don W. Moore MD   

## 2014-04-24 NOTE — Patient Instructions (Addendum)
Medicare Annual Wellness Visit  [place] and the medical providers at [facility] strive to bring you the best medical care.  In doing so we not only want to address your current medical conditions and concerns but also to detect new conditions early and prevent illness, disease and health-related problems.    Medicare offers a yearly Wellness Visit which allows our clinical staff to assess your need for preventative services including immunizations, lifestyle education, counseling to decrease risk of preventable diseases and screening for fall risk and other medical concerns.    This visit is provided free of charge (no copay) for all Medicare recipients. The clinical pharmacists at [facility] have begun to conduct these Wellness Visits which will also include a thorough review of all your medications.    As you primary medical provider recommend that you make an appointment for your Annual Wellness Visit if you have not done so already this year.  You may set up this appointment before you leave today or you may call back ([phone]) and schedule an appointment.  Please make sure when you call that you mention that you are scheduling your Annual Wellness Visit with the clinical pharmacist so that the appointment may be made for the proper length of time.     Continue current medications. Continue good therapeutic lifestyle changes which include good diet and exercise. Fall precautions discussed with patient. If an FOBT was given today- please return it to our front desk. If you are over 80 years old - you may need Prevnar 15 or the adult Pneumonia vaccine.  Continue walking regularly and do resistance exercises with lower extremitys Continue drinking plenty of fluids Prior to decrease the quantity and frequency of pain medication Continue followup with neurosurgeon and orthopedist Continue Lasix only one half pill  daily Continue to watch sodium intake

## 2014-04-25 LAB — BMP8+EGFR
BUN/Creatinine Ratio: 18 (ref 11–26)
BUN: 14 mg/dL (ref 8–27)
CO2: 25 mmol/L (ref 18–29)
CREATININE: 0.8 mg/dL (ref 0.57–1.00)
Calcium: 9.3 mg/dL (ref 8.7–10.3)
Chloride: 97 mmol/L (ref 97–108)
GFR calc Af Amer: 84 mL/min/{1.73_m2} (ref >59)
GFR calc non Af Amer: 73 mL/min/{1.73_m2} (ref >59)
Glucose: 95 mg/dL (ref 65–99)
POTASSIUM: 4.5 mmol/L (ref 3.5–5.2)
SODIUM: 138 mmol/L (ref 134–144)

## 2014-04-25 LAB — HEPATIC FUNCTION PANEL
ALBUMIN: 4 g/dL (ref 3.5–4.8)
ALT: 20 IU/L (ref 0–32)
AST: 46 IU/L — ABNORMAL HIGH (ref 0–40)
Alkaline Phosphatase: 107 IU/L (ref 39–117)
BILIRUBIN DIRECT: 0.09 mg/dL (ref 0.00–0.40)
TOTAL PROTEIN: 6.2 g/dL (ref 6.0–8.5)
Total Bilirubin: 0.3 mg/dL (ref 0.0–1.2)

## 2014-04-25 LAB — NMR, LIPOPROFILE
CHOLESTEROL: 132 mg/dL (ref <200)
HDL Cholesterol by NMR: 52 mg/dL (ref >=40)
HDL PARTICLE NUMBER: 33.5 umol/L (ref >=30.5)
LDL Particle Number: 745 nmol/L (ref <1000)
LDL SIZE: 20.6 nm (ref >20.5)
LDLC SERPL CALC-MCNC: 38 mg/dL (ref <100)
LP-IR Score: 44 (ref <=45)
SMALL LDL PARTICLE NUMBER: 240 nmol/L (ref <=527)
TRIGLYCERIDES BY NMR: 209 mg/dL — AB (ref <150)

## 2014-04-25 LAB — VITAMIN D 25 HYDROXY (VIT D DEFICIENCY, FRACTURES): Vit D, 25-Hydroxy: 43.1 ng/mL (ref 30.0–100.0)

## 2014-04-30 ENCOUNTER — Other Ambulatory Visit: Payer: Self-pay | Admitting: *Deleted

## 2014-04-30 MED ORDER — ROSUVASTATIN CALCIUM 10 MG PO TABS: ORAL_TABLET | ORAL | Status: DC

## 2014-04-30 MED ORDER — OLMESARTAN MEDOXOMIL 20 MG PO TABS: 10.0000 mg | ORAL_TABLET | Freq: Every day | ORAL | Status: DC

## 2014-04-30 MED ORDER — COLESEVELAM HCL 625 MG PO TABS: 1875.0000 mg | ORAL_TABLET | Freq: Two times a day (BID) | ORAL | Status: DC

## 2014-04-30 MED ORDER — ESZOPICLONE 3 MG PO TABS: 3.0000 mg | ORAL_TABLET | Freq: Every day | ORAL | Status: DC

## 2014-04-30 MED ORDER — FLUOXETINE HCL 40 MG PO CAPS: 40.0000 mg | ORAL_CAPSULE | Freq: Every day | ORAL | Status: DC

## 2014-04-30 NOTE — Telephone Encounter (Signed)
Needs mail order prescription signed

## 2014-05-08 ENCOUNTER — Telehealth: Payer: Self-pay | Admitting: *Deleted

## 2014-05-08 NOTE — Telephone Encounter (Signed)
How many Welchol is patient suppose to be taking daily? It is listed as 6. She normally receives samples and only takes one a day.   She is also taking one Tylenol BID. She states that she has done this for years per our instructions. This isn't listed on her med list. Do you want her to continue taking the Tylenol?

## 2014-05-08 NOTE — Telephone Encounter (Signed)
Continue taking WelChol as she has been doing and she may continue to take extra strength Tylenol twice daily as needed. Please put the correct quantity and dosing instructions in her record

## 2014-05-09 NOTE — Telephone Encounter (Signed)
Discussed with daughter in law who will notify patient.

## 2014-05-16 ENCOUNTER — Encounter: Payer: Self-pay | Admitting: Cardiology

## 2014-05-16 ENCOUNTER — Ambulatory Visit (INDEPENDENT_AMBULATORY_CARE_PROVIDER_SITE_OTHER): Payer: Medicare Other | Admitting: Cardiology

## 2014-05-16 VITALS — BP 120/64 | HR 72 | Ht 66.0 in | Wt 188.0 lb

## 2014-05-16 DIAGNOSIS — I2510 Atherosclerotic heart disease of native coronary artery without angina pectoris: Secondary | ICD-10-CM

## 2014-05-16 DIAGNOSIS — E785 Hyperlipidemia, unspecified: Secondary | ICD-10-CM

## 2014-05-16 DIAGNOSIS — I10 Essential (primary) hypertension: Secondary | ICD-10-CM

## 2014-05-16 NOTE — Progress Notes (Signed)
Patient ID: [name] [name], female   DOB: 10-17-1941, 73 y.o.   MRN: [id number] PCP: Laurance Flatten  73 y.o. with history of HTN, hyperlipidemia, and nonobstructive CAD presents for cardiology followup.  Since I last saw her, she had back surgery with spinal fusions.  She has rods in place now.  Prior to surgery, she had Lexiscan Cardiolite in 11/14 with no ischemic or infarction.  She is still in considerable pain.  Her back pain is actually worsening at this time and is considerably limiting her quality of life.  She is very discouraged.  She has been trying to walk in her driveway with her walker.  No exertional dyspnea though pain sometimes "takes her breath away."  No chest pain.  Weight is down 15 lbs.  She says that she has not had an appetite due to pain.   Labs (9/14) K 4.4, creatinine 0.81, LDL particle number 1279, LDL 69 Labs (5/15): K 4.5, creatinine 0.8, LDL-P 745, LDL 38  PMH: 1. Chronic low back pain: spinal stenosis.  Spinal fusion with rods in 2/15 at [facility].  2. Obesity 3. H/o diverticulitis 4. IBS 5. H/o colon polyps 6. GERD: h/o hiatal hernia 7. OSA 8. HTN 9. Hyperlipidemia 10. Depression 11. Cholecystectomy 12. H/o bilateral TKR 13. CAD: LHC (5/09) with 50% ostial LCx, 30-40% mRCA, 30% mLAD.  Lexiscan Cardiolite in 11/14 with EF 68%, no ischemia or infarction.  14. PNA in 3/15  SH: Widow, 2 children, retired Licensed conveyancer, nonsmoker.    FH: CAD in father, mother, brother.    ROS: All systems reviewed and negative except as per HPI.   Current Outpatient Prescriptions  Medication Sig Dispense Refill  . ALPRAZolam (XANAX) 0.25 MG tablet Take 1 tablet (0.25 mg total) by mouth at bedtime as needed for anxiety.  30 tablet  1  . amLODipine (NORVASC) 2.5 MG tablet Take 1 tablet by mouth  daily. For HTN      . aspirin EC 81 MG tablet Take 81 mg by mouth daily. For prophylasxis      . Calcium Carbonate-Vitamin D (CALCIUM-VITAMIN D) 500-200 MG-UNIT per tablet Take 1 tablet by mouth  daily.      . colesevelam (WELCHOL) 625 MG tablet Take 625 mg by mouth daily with breakfast.      . esomeprazole (NEXIUM) 40 MG capsule Take 40 mg by mouth 2 (two) times daily before a meal. For GERD      . Eszopiclone 3 MG TABS Take 1 tablet (3 mg total) by mouth at bedtime. For insomnia,take immediately before bedtime.  90 tablet  1  . fentaNYL (DURAGESIC - DOSED MCG/HR) 75 MCG/HR Place 75 mcg onto the skin as directed.      Marland Kitchen FLUoxetine (PROZAC) 40 MG capsule Take 1 capsule (40 mg total) by mouth daily. For depression  90 capsule  1  . HYDROmorphone (DILAUDID) 2 MG tablet Take 4 mg by mouth every 4 (four) hours as needed for severe pain.       . hyoscyamine (LEVSIN SL) 0.125 MG SL tablet Place 0.125 mg under the tongue every 4 (four) hours as needed. For abd pain.      . methocarbamol (ROBAXIN) 500 MG tablet Take 500 mg by mouth every 6 (six) hours as needed for muscle spasms.      . Multiple Vitamin (MULTIVITAMIN) tablet Take 1 tablet by mouth daily. For supplement      . olmesartan (BENICAR) 20 MG tablet Take 0.5 tablets (10 mg total) by  mouth daily. For HTN  45 tablet  1  . rosuvastatin (CRESTOR) 10 MG tablet Alternate 10mg  & 20mg  daily as directed  90 tablet  1  . furosemide (LASIX) 20 MG tablet Take 1 tablet (20 mg total) by mouth daily.       No current facility-administered medications for this visit.   BP 120/64  Pulse 72  Ht 5\' 6"  (1.676 m)  Wt 188 lb (85.276 kg)  BMI 30.36 kg/m2 General: NAD Neck: No JVD, no thyromegaly or thyroid nodule.  Lungs: Clear to auscultation bilaterally with normal respiratory effort. CV: Nondisplaced PMI.  Heart regular S1/S2, no S3/S4, no murmur.  No peripheral edema.  No carotid bruit.  Normal pedal pulses.  Abdomen: Soft, nontender, no hepatosplenomegaly, no distention.  Skin: Intact without lesions or rashes.  Neurologic: Alert and oriented x 3.  Psych: Normal affect.\  Assessment/Plan: 1. CAD: Moderate nonobstructive CAD on 5/09 cardiac  cath.  No ischemic symptoms but not very active due to ongoing back pain.  Continue ASA 81 mg daily and statin.  2. Hyperlipidemia: Excellent LDL and improved LDL particle number on lipid profile in 5/15.  Continue current Crestor dosing.  3. HTN: BP controllled on current medications.    4. Back pain: This continues to be her main problem.  She is going to call [facility] to try to get a followup appointment.   Larey Dresser 05/16/2014

## 2014-05-16 NOTE — Patient Instructions (Signed)
Your physician wants you to follow-up in: 6 months with Dr McLean. (November 2015).  You will receive a reminder letter in the mail two months in advance. If you don't receive a letter, please call our office to schedule the follow-up appointment.    

## 2014-05-17 ENCOUNTER — Encounter: Payer: Self-pay | Admitting: Cardiology

## 2014-05-17 ENCOUNTER — Encounter: Payer: Self-pay | Admitting: Pulmonary Disease

## 2014-05-21 ENCOUNTER — Ambulatory Visit: Payer: Medicare Other | Attending: Neurological Surgery | Admitting: Physical Therapy

## 2014-05-21 DIAGNOSIS — I10 Essential (primary) hypertension: Secondary | ICD-10-CM | POA: Diagnosis not present

## 2014-05-21 DIAGNOSIS — M545 Low back pain, unspecified: Secondary | ICD-10-CM | POA: Insufficient documentation

## 2014-05-21 DIAGNOSIS — Z981 Arthrodesis status: Secondary | ICD-10-CM | POA: Diagnosis not present

## 2014-05-21 DIAGNOSIS — IMO0001 Reserved for inherently not codable concepts without codable children: Secondary | ICD-10-CM | POA: Diagnosis present

## 2014-05-21 DIAGNOSIS — R5381 Other malaise: Secondary | ICD-10-CM | POA: Insufficient documentation

## 2014-05-21 DIAGNOSIS — R293 Abnormal posture: Secondary | ICD-10-CM | POA: Diagnosis not present

## 2014-05-23 ENCOUNTER — Ambulatory Visit: Payer: Medicare Other | Admitting: Physical Therapy

## 2014-05-23 ENCOUNTER — Other Ambulatory Visit (INDEPENDENT_AMBULATORY_CARE_PROVIDER_SITE_OTHER): Payer: Medicare Other

## 2014-05-23 DIAGNOSIS — R7989 Other specified abnormal findings of blood chemistry: Secondary | ICD-10-CM

## 2014-05-23 DIAGNOSIS — IMO0001 Reserved for inherently not codable concepts without codable children: Secondary | ICD-10-CM | POA: Diagnosis not present

## 2014-05-23 LAB — POCT CBC
GRANULOCYTE PERCENT: 62 % (ref 37–80)
HEMATOCRIT: 39.6 % (ref 37.7–47.9)
Hemoglobin: 12.1 g/dL — AB (ref 12.2–16.2)
Lymph, poc: 1.9 (ref 0.6–3.4)
MCH, POC: 26.6 pg — AB (ref 27–31.2)
MCHC: 30.6 g/dL — AB (ref 31.8–35.4)
MCV: 86.8 fL (ref 80–97)
MPV: 8.3 fL (ref 0–99.8)
POC GRANULOCYTE: 3.5 (ref 2–6.9)
POC LYMPH PERCENT: 33.4 %L (ref 10–50)
Platelet Count, POC: 205 10*3/uL (ref 142–424)
RBC: 4.6 M/uL (ref 4.04–5.48)
RDW, POC: 14.3 %
WBC: 5.7 10*3/uL (ref 4.6–10.2)

## 2014-05-23 NOTE — Progress Notes (Signed)
Patient came in for labs only.

## 2014-05-24 LAB — BMP8+EGFR
BUN/Creatinine Ratio: 17 (ref 11–26)
BUN: 13 mg/dL (ref 8–27)
CALCIUM: 9.5 mg/dL (ref 8.7–10.3)
CO2: 31 mmol/L — ABNORMAL HIGH (ref 18–29)
Chloride: 98 mmol/L (ref 97–108)
Creatinine, Ser: 0.77 mg/dL (ref 0.57–1.00)
GFR, EST AFRICAN AMERICAN: 88 mL/min/{1.73_m2} (ref >59)
GFR, EST NON AFRICAN AMERICAN: 76 mL/min/{1.73_m2} (ref >59)
GLUCOSE: 89 mg/dL (ref 65–99)
Potassium: 4.6 mmol/L (ref 3.5–5.2)
Sodium: 140 mmol/L (ref 134–144)

## 2014-05-28 ENCOUNTER — Ambulatory Visit: Payer: Medicare Other | Admitting: *Deleted

## 2014-05-28 DIAGNOSIS — IMO0001 Reserved for inherently not codable concepts without codable children: Secondary | ICD-10-CM | POA: Diagnosis not present

## 2014-05-30 ENCOUNTER — Ambulatory Visit: Payer: Medicare Other | Admitting: Physical Therapy

## 2014-05-30 DIAGNOSIS — IMO0001 Reserved for inherently not codable concepts without codable children: Secondary | ICD-10-CM | POA: Diagnosis not present

## 2014-06-01 ENCOUNTER — Ambulatory Visit: Payer: Medicare Other | Admitting: Physical Therapy

## 2014-06-01 DIAGNOSIS — IMO0001 Reserved for inherently not codable concepts without codable children: Secondary | ICD-10-CM | POA: Diagnosis not present

## 2014-06-04 ENCOUNTER — Ambulatory Visit: Payer: Medicare Other | Admitting: Physical Therapy

## 2014-06-04 DIAGNOSIS — IMO0001 Reserved for inherently not codable concepts without codable children: Secondary | ICD-10-CM | POA: Diagnosis not present

## 2014-06-06 ENCOUNTER — Ambulatory Visit: Payer: Medicare Other | Admitting: Physical Therapy

## 2014-06-06 DIAGNOSIS — IMO0001 Reserved for inherently not codable concepts without codable children: Secondary | ICD-10-CM | POA: Diagnosis not present

## 2014-06-08 ENCOUNTER — Encounter: Payer: Self-pay | Admitting: *Deleted

## 2014-06-11 ENCOUNTER — Ambulatory Visit: Payer: Medicare Other | Admitting: Physical Therapy

## 2014-06-11 DIAGNOSIS — IMO0001 Reserved for inherently not codable concepts without codable children: Secondary | ICD-10-CM | POA: Diagnosis not present

## 2014-06-13 ENCOUNTER — Encounter: Payer: Medicare Other | Admitting: Physical Therapy

## 2014-06-25 ENCOUNTER — Ambulatory Visit: Payer: Medicare Other | Attending: Neurological Surgery | Admitting: Physical Therapy

## 2014-06-25 DIAGNOSIS — IMO0001 Reserved for inherently not codable concepts without codable children: Secondary | ICD-10-CM | POA: Insufficient documentation

## 2014-06-25 DIAGNOSIS — Z981 Arthrodesis status: Secondary | ICD-10-CM | POA: Insufficient documentation

## 2014-06-25 DIAGNOSIS — M545 Low back pain, unspecified: Secondary | ICD-10-CM | POA: Diagnosis not present

## 2014-06-25 DIAGNOSIS — I10 Essential (primary) hypertension: Secondary | ICD-10-CM | POA: Insufficient documentation

## 2014-06-25 DIAGNOSIS — R5381 Other malaise: Secondary | ICD-10-CM | POA: Diagnosis not present

## 2014-06-25 DIAGNOSIS — R293 Abnormal posture: Secondary | ICD-10-CM | POA: Diagnosis not present

## 2014-06-27 ENCOUNTER — Encounter: Payer: Medicare Other | Admitting: Physical Therapy

## 2014-07-02 ENCOUNTER — Ambulatory Visit: Payer: Medicare Other | Admitting: Physical Therapy

## 2014-07-02 DIAGNOSIS — IMO0001 Reserved for inherently not codable concepts without codable children: Secondary | ICD-10-CM | POA: Diagnosis not present

## 2014-07-05 ENCOUNTER — Ambulatory Visit: Payer: Medicare Other | Admitting: Physical Therapy

## 2014-07-05 DIAGNOSIS — IMO0001 Reserved for inherently not codable concepts without codable children: Secondary | ICD-10-CM | POA: Diagnosis not present

## 2014-07-10 ENCOUNTER — Ambulatory Visit: Payer: Medicare Other | Admitting: Physical Therapy

## 2014-07-10 DIAGNOSIS — IMO0001 Reserved for inherently not codable concepts without codable children: Secondary | ICD-10-CM | POA: Diagnosis not present

## 2014-08-01 ENCOUNTER — Other Ambulatory Visit: Payer: Self-pay | Admitting: *Deleted

## 2014-08-01 MED ORDER — ESOMEPRAZOLE MAGNESIUM 40 MG PO CPDR: 40.0000 mg | DELAYED_RELEASE_CAPSULE | Freq: Two times a day (BID) | ORAL | Status: DC

## 2014-08-21 ENCOUNTER — Encounter: Payer: Self-pay | Admitting: Family Medicine

## 2014-08-21 ENCOUNTER — Ambulatory Visit (INDEPENDENT_AMBULATORY_CARE_PROVIDER_SITE_OTHER): Payer: Medicare Other | Admitting: Family Medicine

## 2014-08-21 VITALS — BP 126/80 | HR 71 | Temp 98.3°F | Ht 66.0 in | Wt 183.0 lb

## 2014-08-21 DIAGNOSIS — I10 Essential (primary) hypertension: Secondary | ICD-10-CM

## 2014-08-21 DIAGNOSIS — I2510 Atherosclerotic heart disease of native coronary artery without angina pectoris: Secondary | ICD-10-CM

## 2014-08-21 DIAGNOSIS — E8881 Metabolic syndrome: Secondary | ICD-10-CM

## 2014-08-21 DIAGNOSIS — E559 Vitamin D deficiency, unspecified: Secondary | ICD-10-CM

## 2014-08-21 DIAGNOSIS — M549 Dorsalgia, unspecified: Secondary | ICD-10-CM

## 2014-08-21 DIAGNOSIS — E785 Hyperlipidemia, unspecified: Secondary | ICD-10-CM

## 2014-08-21 LAB — POCT CBC
GRANULOCYTE PERCENT: 65.8 % (ref 37–80)
HCT, POC: 39.1 % (ref 37.7–47.9)
Hemoglobin: 12.8 g/dL (ref 12.2–16.2)
LYMPH, POC: 2.1 (ref 0.6–3.4)
MCH, POC: 28.9 pg (ref 27–31.2)
MCHC: 32.7 g/dL (ref 31.8–35.4)
MCV: 88.6 fL (ref 80–97)
MPV: 8.4 fL (ref 0–99.8)
PLATELET COUNT, POC: 221 10*3/uL (ref 142–424)
POC GRANULOCYTE: 4.7 (ref 2–6.9)
POC LYMPH PERCENT: 29.2 %L (ref 10–50)
RBC: 4.4 M/uL (ref 4.04–5.48)
RDW, POC: 14.5 %
WBC: 7.2 10*3/uL (ref 4.6–10.2)

## 2014-08-21 LAB — POCT GLYCOSYLATED HEMOGLOBIN (HGB A1C): Hemoglobin A1C: 5.6

## 2014-08-21 NOTE — Progress Notes (Signed)
Subjective:    Patient ID: [name] [name], female    DOB: 22-Apr-1941, 73 y.o.   MRN: [id number]  HPI Pt here for follow up and management of chronic medical problems. The patient comes in today with her daughter. She is using a rolling walker. He still is followed by the neurosurgeon at [facility]. She will get lab work done today. She does not need any refills. She is to have additional back surgery because of kyphosis and increased pain above the previous surgery. She sees the neurosurgeon again next week        Patient Active Problem List   Diagnosis Date Noted  . Metabolic syndrome 74/07/1447  . S/P spinal fusion 02/23/2014  . Acute blood loss anemia 02/23/2014  . Chronic pain associated with significant psychosocial dysfunction 02/23/2014  . Preoperative evaluation of a medical condition to rule out surgical contraindications (TAR required) 10/09/2013  . History of IBS 01/26/2012  . 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 EDEMA 06/25/2010  . DEGENERATIVE JOINT DISEASE 04/08/2010  . OTHER DISORDERS OF NERVOUS SYSTEM&SENSE ORGANS 04/08/2010  . HYPERCHOLESTEROLEMIA  IIA 04/08/2009  . Obesity (BMI 30.0-34.9) 04/04/2009  . CIRCADIAN RHYTHM SLEEP D/O DELAY SLEEP PHSE TYPE 04/04/2009  . CARPAL TUNNEL SYNDROME 04/04/2009  . CAD, UNSPECIFIED SITE 04/04/2009  . DIASTOLIC HEART FAILURE, CHRONIC 04/04/2009  . DIARRHEA 01/17/2009  . IRRITABLE BOWEL SYNDROME 11/13/2008  . ESOPHAGEAL STRICTURE 11/12/2008  . GERD 11/12/2008  . DUODENITIS, WITHOUT HEMORRHAGE 11/12/2008  . Diaphragmatic Hernia without Mention of Obstruction or Gangrene 11/12/2008  . DIVERTICULOSIS, COLON 11/12/2008  . COLONIC POLYPS, HX OF 11/12/2008  . OBSTRUCTIVE SLEEP APNEA 05/23/2008  . DYSPNEA 05/23/2008  . HYPERLIPIDEMIA 05/22/2008  . DEPRESSION 05/22/2008  . HYPERTENSION 05/22/2008  . INSOMNIA 05/22/2008   Outpatient Encounter Prescriptions as of 08/21/2014  Medication Sig  . ALPRAZolam  (XANAX) 0.25 MG tablet Take 1 tablet (0.25 mg total) by mouth at bedtime as needed for anxiety.  Marland Kitchen amLODipine (NORVASC) 2.5 MG tablet Take 1 tablet by mouth  daily. For HTN  . aspirin EC 81 MG tablet Take 81 mg by mouth daily. For prophylasxis  . Calcium Carbonate-Vitamin D (CALCIUM-VITAMIN D) 500-200 MG-UNIT per tablet Take 1 tablet by mouth daily.  . colesevelam (WELCHOL) 625 MG tablet Take 625 mg by mouth daily with breakfast.  . diphenoxylate-atropine (LOMOTIL) 2.5-0.025 MG per tablet TAKE 1 TABLET BY MOUTH 4 TIMES A DAY AS NEEDED  . esomeprazole (NEXIUM) 40 MG capsule Take 1 capsule (40 mg total) by mouth 2 (two) times daily before a meal. For GERD  . Eszopiclone 3 MG TABS Take 1 tablet (3 mg total) by mouth at bedtime. For insomnia,take immediately before bedtime.  . fentaNYL (DURAGESIC - DOSED MCG/HR) 75 MCG/HR Place 75 mcg onto the skin as directed.  Marland Kitchen FLUoxetine (PROZAC) 40 MG capsule Take 1 capsule (40 mg total) by mouth daily. For depression  . furosemide (LASIX) 20 MG tablet Take 1 tablet (20 mg total) by mouth daily.  Marland Kitchen HYDROmorphone (DILAUDID) 2 MG tablet Take 4 mg by mouth every 4 (four) hours as needed for severe pain.   . hyoscyamine (LEVSIN SL) 0.125 MG SL tablet Place 0.125 mg under the tongue every 4 (four) hours as needed. For abd pain.  . methocarbamol (ROBAXIN) 500 MG tablet Take 500 mg by mouth every 6 (six) hours as needed for muscle spasms.  . Multiple Vitamin (MULTIVITAMIN) tablet Take 1 tablet by mouth daily. For supplement  . rosuvastatin (CRESTOR)  10 MG tablet Alternate 19m & 248mdaily as directed  . [DISCONTINUED] olmesartan (BENICAR) 20 MG tablet Take 0.5 tablets (10 mg total) by mouth daily. For HTN    Review of Systems  Constitutional: Negative.   HENT: Negative.   Eyes: Negative.   Respiratory: Negative.   Cardiovascular: Negative.   Gastrointestinal: Negative.   Endocrine: Negative.   Genitourinary: Negative.   Musculoskeletal: Positive for back pain  (Dr PoPrince R[facility] follows this).  Skin: Negative.   Allergic/Immunologic: Negative.   Neurological: Negative.   Hematological: Negative.   Psychiatric/Behavioral: Negative.        Objective:   Physical Exam  Nursing note and vitals reviewed. Constitutional: She is oriented to person, place, and time. She appears distressed.  The patient is anxious because of ongoing upper back pain following her previous back surgery. She is somewhat tearful during the visit.  HENT:  Head: Normocephalic and atraumatic.  Right Ear: External ear normal.  Left Ear: External ear normal.  Nose: Nose normal.  Mouth/Throat: Oropharynx is clear and moist.  Eyes: Conjunctivae and EOM are normal. Pupils are equal, round, and reactive to light. Right eye exhibits no discharge. Left eye exhibits no discharge. No scleral icterus.  Neck: Normal range of motion. Neck supple. No JVD present. No thyromegaly present.  Cardiovascular: Normal rate, regular rhythm and normal heart sounds.   No murmur heard. At 72 per minute  Pulmonary/Chest: Effort normal and breath sounds normal. No respiratory distress. She has no wheezes. She has no rales. She exhibits no tenderness.  Abdominal: Soft. Bowel sounds are normal. She exhibits no mass. There is tenderness. There is no rebound and no guarding.  There is generalized abdominal tenderness  Musculoskeletal: She exhibits no edema and no tenderness.  The patient's range of motion is severely limited due to her back pain. Even in the exam chair she was unable to sit in one position for a period of time due to the pain.  Lymphadenopathy:    She has no cervical adenopathy.  Neurological: She is alert and oriented to person, place, and time. She has normal reflexes. No cranial nerve deficit.  Skin: Skin is warm and dry. No rash noted.  Psychiatric: She has a normal mood and affect. Her behavior is normal. Judgment and thought content normal.   BP 126/80  Pulse 71  Temp(Src)  98.3 F (36.8 C) (Oral)  Ht 5' 6"  (1.676 m)  Wt 183 lb (83.008 kg)  BMI 29.55 kg/m2        Assessment & Plan:  1. CAD, UNSPECIFIED SITE - POCT CBC  2. Metabolic syndrome - POCT CBC - POCT glycosylated hemoglobin (Hb A1C)  3. HYPERLIPIDEMIA - POCT CBC - NMR, lipoprofile  4. HYPERTENSION - POCT CBC - BMP8+EGFR - Hepatic function panel  5. Vitamin D deficiency - Vit D  25 hydroxy (rtn osteoporosis monitoring)  6. Upper back pain -Followup with neurosurgeon  Patient Instructions                       Medicare Annual Wellness Visit  [facility] the medical providers at [facility] to bring you the best medical care.  In doing so we not only want to address your current medical conditions and concerns but also to detect new conditions early and prevent illness, disease and health-related problems.    Medicare offers a yearly Wellness Visit which allows our clinical staff to assess your need for preventative  services including immunizations, lifestyle education, counseling to decrease risk of preventable diseases and screening for fall risk and other medical concerns.    This visit is provided free of charge (no copay) for all Medicare recipients. The clinical pharmacists at [facility] have begun to conduct these Wellness Visits which will also include a thorough review of all your medications.    As you primary medical provider recommend that you make an appointment for your Annual Wellness Visit if you have not done so already this year.  You may set up this appointment before you leave today or you may call back ([phone]) and schedule an appointment.  Please make sure when you call that you mention that you are scheduling your Annual Wellness Visit with the clinical pharmacist so that the appointment may be made for the proper length of time.      Continue current medications. Continue good therapeutic lifestyle  changes which include good diet and exercise. Fall precautions discussed with patient. If an FOBT was given today- please return it to our front desk. If you are over 77 years old - you may need Prevnar 40 or the adult Pneumonia vaccine.  Flu Shots will be available at our office starting mid- September. Please call and schedule a FLU CLINIC APPOINTMENT.   Please get your surgery done so you can start feeling better Continue current medication We will call you with your lab work results as soon as those results are available   Arrie Senate MD

## 2014-08-21 NOTE — Patient Instructions (Addendum)
Medicare Annual Wellness Visit  [facility] and the medical providers at [facility] strive to bring you the best medical care.  In doing so we not only want to address your current medical conditions and concerns but also to detect new conditions early and prevent illness, disease and health-related problems.    Medicare offers a yearly Wellness Visit which allows our clinical staff to assess your need for preventative services including immunizations, lifestyle education, counseling to decrease risk of preventable diseases and screening for fall risk and other medical concerns.    This visit is provided free of charge (no copay) for all Medicare recipients. The clinical pharmacists at [facility] have begun to conduct these Wellness Visits which will also include a thorough review of all your medications.    As you primary medical provider recommend that you make an appointment for your Annual Wellness Visit if you have not done so already this year.  You may set up this appointment before you leave today or you may call back ([phone]) and schedule an appointment.  Please make sure when you call that you mention that you are scheduling your Annual Wellness Visit with the clinical pharmacist so that the appointment may be made for the proper length of time.      Continue current medications. Continue good therapeutic lifestyle changes which include good diet and exercise. Fall precautions discussed with patient. If an FOBT was given today- please return it to our front desk. If you are over 25 years old - you may need Prevnar 68 or the adult Pneumonia vaccine.  Flu Shots will be available at our office starting mid- September. Please call and schedule a FLU CLINIC APPOINTMENT.   Please get your surgery done so you can start feeling better Continue current medication We will call you with your lab work results as soon as those  results are available

## 2014-08-22 ENCOUNTER — Other Ambulatory Visit: Payer: Self-pay | Admitting: Family Medicine

## 2014-08-22 DIAGNOSIS — R748 Abnormal levels of other serum enzymes: Secondary | ICD-10-CM

## 2014-08-22 LAB — NMR, LIPOPROFILE
CHOLESTEROL: 138 mg/dL (ref 100–199)
HDL Cholesterol by NMR: 54 mg/dL (ref >39)
HDL Particle Number: 31.3 umol/L (ref >=30.5)
LDL Particle Number: 693 nmol/L (ref <1000)
LDL SIZE: 20.3 nm (ref >20.5)
LDLC SERPL CALC-MCNC: 50 mg/dL (ref 0–99)
LP-IR Score: 44 (ref <=45)
Small LDL Particle Number: 289 nmol/L (ref <=527)
TRIGLYCERIDES BY NMR: 171 mg/dL — AB (ref 0–149)

## 2014-08-22 LAB — BMP8+EGFR
BUN/Creatinine Ratio: 14 (ref 11–26)
BUN: 12 mg/dL (ref 8–27)
CALCIUM: 9.5 mg/dL (ref 8.7–10.3)
CO2: 29 mmol/L (ref 18–29)
Chloride: 98 mmol/L (ref 97–108)
Creatinine, Ser: 0.84 mg/dL (ref 0.57–1.00)
GFR, EST AFRICAN AMERICAN: 79 mL/min/{1.73_m2} (ref >59)
GFR, EST NON AFRICAN AMERICAN: 69 mL/min/{1.73_m2} (ref >59)
GLUCOSE: 103 mg/dL — AB (ref 65–99)
Potassium: 4.8 mmol/L (ref 3.5–5.2)
Sodium: 141 mmol/L (ref 134–144)

## 2014-08-22 LAB — HEPATIC FUNCTION PANEL
ALBUMIN: 3.9 g/dL (ref 3.5–4.8)
ALK PHOS: 278 IU/L — AB (ref 39–117)
ALT: 42 IU/L — ABNORMAL HIGH (ref 0–32)
AST: 27 IU/L (ref 0–40)
BILIRUBIN DIRECT: 0.16 mg/dL (ref 0.00–0.40)
Total Bilirubin: 0.4 mg/dL (ref 0.0–1.2)
Total Protein: 6.2 g/dL (ref 6.0–8.5)

## 2014-08-22 LAB — VITAMIN D 25 HYDROXY (VIT D DEFICIENCY, FRACTURES): Vit D, 25-Hydroxy: 36.5 ng/mL (ref 30.0–100.0)

## 2014-08-28 ENCOUNTER — Other Ambulatory Visit (INDEPENDENT_AMBULATORY_CARE_PROVIDER_SITE_OTHER): Payer: Medicare Other

## 2014-08-28 ENCOUNTER — Other Ambulatory Visit: Payer: Self-pay

## 2014-08-28 DIAGNOSIS — R7989 Other specified abnormal findings of blood chemistry: Secondary | ICD-10-CM

## 2014-08-28 DIAGNOSIS — R945 Abnormal results of liver function studies: Principal | ICD-10-CM

## 2014-08-28 NOTE — Progress Notes (Signed)
Lab only 

## 2014-08-29 ENCOUNTER — Encounter: Payer: Self-pay | Admitting: Physician Assistant

## 2014-08-29 ENCOUNTER — Ambulatory Visit (INDEPENDENT_AMBULATORY_CARE_PROVIDER_SITE_OTHER): Payer: Medicare Other | Admitting: Physician Assistant

## 2014-08-29 VITALS — BP 122/70 | HR 72 | Ht 66.0 in | Wt 184.0 lb

## 2014-08-29 DIAGNOSIS — I2510 Atherosclerotic heart disease of native coronary artery without angina pectoris: Secondary | ICD-10-CM

## 2014-08-29 DIAGNOSIS — E669 Obesity, unspecified: Secondary | ICD-10-CM

## 2014-08-29 DIAGNOSIS — I10 Essential (primary) hypertension: Secondary | ICD-10-CM

## 2014-08-29 DIAGNOSIS — Z981 Arthrodesis status: Secondary | ICD-10-CM

## 2014-08-29 DIAGNOSIS — Z01810 Encounter for preprocedural cardiovascular examination: Secondary | ICD-10-CM

## 2014-08-29 DIAGNOSIS — I5032 Chronic diastolic (congestive) heart failure: Secondary | ICD-10-CM

## 2014-08-29 DIAGNOSIS — E785 Hyperlipidemia, unspecified: Secondary | ICD-10-CM

## 2014-08-29 LAB — HEPATIC FUNCTION PANEL
ALBUMIN: 3.9 g/dL (ref 3.5–4.8)
ALT: 21 IU/L (ref 0–32)
AST: 22 IU/L (ref 0–40)
Alkaline Phosphatase: 176 IU/L — ABNORMAL HIGH (ref 39–117)
BILIRUBIN TOTAL: 0.2 mg/dL (ref 0.0–1.2)
Bilirubin, Direct: 0.07 mg/dL (ref 0.00–0.40)
Total Protein: 5.9 g/dL — ABNORMAL LOW (ref 6.0–8.5)

## 2014-08-29 LAB — CK: Total CK: 167 U/L (ref 24–173)

## 2014-08-29 NOTE — Progress Notes (Signed)
Date:  08/29/2014   ID:  [name] [name], DOB Oct 05, 1941, MRN [id number]  PCP:  Redge Gainer, MD  Primary Cardiologist:  Aundra Dubin    History of Present Illness: [name] [name] is a 73 y.o. female with history of HTN, hyperlipidemia, and nonobstructive CAD presents for cardiology followup. She had back surgery with spinal fusions in Feb 2015. She has rods in place now. Prior to surgery, she had Lexiscan Cardiolite in 11/14 with no ischemic or infarction. She is still in considerable pain. Her back pain is actually worsening at this time and is considerably limiting her quality of life.  She is scheduled for another surgery in two weeks and wanted to make sure she was ok from a cardiology standpoint.  She reports no CP or exertional dyspnea however, the pain sometimes "takes her breath away."   She also denies nausea, vomiting, fever, orthopnea, dizziness, PND, cough, congestion, abdominal pain, hematochezia, melena, lower extremity edema, claudication.  Wt Readings from Last 3 Encounters:  08/29/14 184 lb (83.462 kg)  08/21/14 183 lb (83.008 kg)  05/16/14 188 lb (85.276 kg)     Past Medical History  Diagnosis Date  . Obesity   . Circadian rhythm sleep disorder   . CTS (carpal tunnel syndrome)   . Diarrhea   . IBS (irritable bowel syndrome)   . Diverticulitis of colon   . Gastritis   . Esophageal stricture   . Personal history of colonic polyps 10/25/2011 & 12/02/11    not retrieved Dr Lyla Son & tubular adenomas  . Hiatal hernia   . GERD (gastroesophageal reflux disease)   . Obstructive sleep apnea   . Insomnia   . HTN (hypertension)   . Hyperlipidemia   . Depression   . DIASTOLIC HEART FAILURE, CHRONIC 04/04/2009    Qualifier: Diagnosis of  By: Mare Ferrari, RMA, Sherri      Current Outpatient Prescriptions  Medication Sig Dispense Refill  . ALPRAZolam (XANAX) 0.25 MG tablet Take 1 tablet (0.25 mg total) by mouth at bedtime as needed for anxiety.  30 tablet  1  . amLODipine  (NORVASC) 2.5 MG tablet Take 1 tablet by mouth  daily. For HTN      . aspirin EC 81 MG tablet Take 81 mg by mouth daily. For prophylasxis      . Calcium Carbonate-Vitamin D (CALCIUM-VITAMIN D) 500-200 MG-UNIT per tablet Take 1 tablet by mouth daily.      . colesevelam (WELCHOL) 625 MG tablet Take 625 mg by mouth daily with breakfast.      . diphenoxylate-atropine (LOMOTIL) 2.5-0.025 MG per tablet TAKE 1 TABLET BY MOUTH 4 TIMES A DAY AS NEEDED      . esomeprazole (NEXIUM) 40 MG capsule Take 1 capsule (40 mg total) by mouth 2 (two) times daily before a meal. For GERD  180 capsule  1  . Eszopiclone 3 MG TABS Take 1 tablet (3 mg total) by mouth at bedtime. For insomnia,take immediately before bedtime.  90 tablet  1  . fentaNYL (DURAGESIC - DOSED MCG/HR) 75 MCG/HR Place 75 mcg onto the skin. Every other day      . FLUoxetine (PROZAC) 40 MG capsule Take 1 capsule (40 mg total) by mouth daily. For depression  90 capsule  1  . furosemide (LASIX) 20 MG tablet Take 1 tablet (20 mg total) by mouth daily.      Marland Kitchen HYDROmorphone (DILAUDID) 2 MG tablet Take 4 mg by mouth every 4 (four) hours as needed for  severe pain.       . hyoscyamine (LEVSIN SL) 0.125 MG SL tablet Place 0.125 mg under the tongue every 4 (four) hours as needed. For abd pain.      . methocarbamol (ROBAXIN) 500 MG tablet Take 500 mg by mouth every 6 (six) hours as needed for muscle spasms.      . Multiple Vitamin (MULTIVITAMIN) tablet Take 1 tablet by mouth daily. For supplement      . rosuvastatin (CRESTOR) 10 MG tablet Alternate 10mg  & 20mg  daily as directed  90 tablet  1   No current facility-administered medications for this visit.    Allergies:    Allergies  Allergen Reactions  . Morphine And Related Itching  . Altace [Ramipril]   . Duloxetine     REACTION: agitaded- weight gain  . Floxin [Ofloxacin]   . Lipitor [Atorvastatin]   . Lovaza [Omega-3-Acid Ethyl Esters]   . Oxycodone-Acetaminophen   . Percocet  [Oxycodone-Acetaminophen]   . Pravachol [Pravastatin Sodium]   . Trilipix [Choline Fenofibrate]   . Zetia [Ezetimibe]   . Zocor [Simvastatin]     Social History:  The patient  reports that she has never smoked. She has never used smokeless tobacco. She reports that she drinks alcohol. She reports that she does not use illicit drugs.   Family history:   Family History  Problem Relation Age of Onset  . Coronary artery disease Father   . Peripheral vascular disease Father   . Coronary artery disease Mother   . Coronary artery disease Brother   . Colon cancer Sister   . Emphysema Sister   . Sleep apnea Son   . Colon cancer Sister     spread to her brain    ROS:  Please see the history of present illness.  All other systems reviewed and negative.   PHYSICAL EXAM: VS:  BP 122/70  Pulse 72  Ht 5\' 6"  (1.676 m)  Wt 184 lb (83.462 kg)  BMI 29.71 kg/m2 Well nourished, well developed, in no acute distress HEENT: Pupils are equal round react to light accommodation extraocular movements are intact.  Neck:  No cervical lymphadenopathy. Cardiac: Regular rate and rhythm without murmurs rubs or gallops. Lungs:  clear to auscultation bilaterally, no wheezing, rhonchi or rales Abd: soft, Mildly tender in the LUQ, positive bowel sounds all quadrants, no hepatosplenomegaly Ext: no lower extremity edema.  2+ radial and dorsalis pedis pulses. Skin: warm and dry Neuro:  Grossly normal  EKG:  NSR 72 BPM, PACs.     ASSESSMENT AND PLAN:  Problem List Items Addressed This Visit   HYPERLIPIDEMIA       Lipid Panel     Component Value Date/Time   CHOL 138 08/21/2014 1141   TRIG 171* 08/21/2014 1141   TRIG 239* 05/11/2013 1536   HDL 54 08/21/2014 1141   LDLCALC 50 08/21/2014 1141   LDLCALC 65 05/11/2013 1536   Overall lipids have improved.  Continue statin     Obesity (BMI 30.0-34.9)     Weight decreased     HYPERTENSION     BP well controlled    CAD, UNSPECIFIED SITE     Lexiscan last  November was nonischemic.  No complaints of angina.  She did fine with surgery in Feb and I think she low risk again.    DIASTOLIC HEART FAILURE, CHRONIC     Euvolemic    S/P spinal fusion     She is scheduled for surgery in two weeks with  Dr. Prince Rome at [facility].     Other Visit Diagnoses   Preop cardiovascular exam    -  Primary    Relevant Orders       EKG 12-Lead

## 2014-08-29 NOTE — Patient Instructions (Signed)
[place] for surgery.  2.  Follow up with Dr. Aundra Dubin in 6 months.

## 2014-08-29 NOTE — Assessment & Plan Note (Signed)
Weight decreased

## 2014-08-29 NOTE — Assessment & Plan Note (Signed)
She is scheduled for surgery in two weeks with  Dr. Prince Rome at [facility].

## 2014-08-29 NOTE — Assessment & Plan Note (Signed)
  Lipid Panel     Component Value Date/Time   CHOL 138 08/21/2014 1141   TRIG 171* 08/21/2014 1141   TRIG 239* 05/11/2013 1536   HDL 54 08/21/2014 1141   LDLCALC 50 08/21/2014 1141   LDLCALC 65 05/11/2013 1536   Overall lipids have improved.  Continue statin

## 2014-08-29 NOTE — Assessment & Plan Note (Signed)
Euvolemic. 

## 2014-08-29 NOTE — Assessment & Plan Note (Signed)
BP well controlled.

## 2014-08-29 NOTE — Assessment & Plan Note (Signed)
Lexiscan last November was nonischemic.  No complaints of angina.  She did fine with surgery in Feb and I think she low risk again.

## 2014-09-18 DIAGNOSIS — M40209 Unspecified kyphosis, site unspecified: Secondary | ICD-10-CM | POA: Insufficient documentation

## 2014-09-18 DIAGNOSIS — M438X9 Other specified deforming dorsopathies, site unspecified: Secondary | ICD-10-CM | POA: Insufficient documentation

## 2014-10-04 ENCOUNTER — Telehealth: Payer: Self-pay | Admitting: *Deleted

## 2014-10-04 NOTE — Telephone Encounter (Signed)
Called pt lvm to call back need to move pt's appointment to 3:00 pm same day October 26.

## 2014-10-08 NOTE — Telephone Encounter (Signed)
Pt's daughter called up to r/s pt's appointment, pt still in rehab.  Moved appointment to November 11th.  Will try and get records from [facility] faxed over.

## 2014-10-08 NOTE — Telephone Encounter (Signed)
Moved pt's appointment on Monday October 26 from 1:30 to 3:00 pm.  LMOVM for pt to call back to confirm.  Per Truitt Merle, NP request.  Provider has [employer].

## 2014-10-15 ENCOUNTER — Encounter: Payer: Medicare Other | Admitting: Nurse Practitioner

## 2014-10-23 ENCOUNTER — Other Ambulatory Visit (HOSPITAL_COMMUNITY): Payer: Self-pay | Admitting: *Deleted

## 2014-10-24 ENCOUNTER — Ambulatory Visit (HOSPITAL_COMMUNITY)
Admission: RE | Admit: 2014-10-24 | Discharge: 2014-10-24 | Disposition: A | Discharge status: Home / Self Care | Payer: Medicare Other | Source: Ambulatory Visit | Attending: Family Medicine | Admitting: Family Medicine

## 2014-10-24 DIAGNOSIS — Z01812 Encounter for preprocedural laboratory examination: Secondary | ICD-10-CM | POA: Diagnosis not present

## 2014-10-24 LAB — PREPARE RBC (CROSSMATCH)

## 2014-10-24 LAB — ABO/RH: ABO/RH(D): O POS

## 2014-10-24 MED ORDER — SODIUM CHLORIDE 0.9 % IV SOLN: Freq: Once | INTRAVENOUS | Status: DC

## 2014-10-24 MED ORDER — METHOCARBAMOL 500 MG PO TABS
500.0000 mg | ORAL_TABLET | ORAL | Status: DC | PRN
Start: 2014-10-24 — End: 2014-10-25
  Filled 2014-10-24: qty 1

## 2014-10-24 MED ORDER — HYDROCODONE-ACETAMINOPHEN 5-325 MG PO TABS: 1.0000 | ORAL_TABLET | ORAL | Status: DC | PRN

## 2014-10-25 LAB — TYPE AND SCREEN
ABO/RH(D): O POS
Antibody Screen: NEGATIVE
UNIT DIVISION: 0
Unit division: 0

## 2014-10-27 ENCOUNTER — Encounter (HOSPITAL_COMMUNITY): Payer: Self-pay | Admitting: *Deleted

## 2014-10-27 ENCOUNTER — Observation Stay (HOSPITAL_COMMUNITY)
Admission: EM | Admit: 2014-10-27 | Discharge: 2014-10-28 | Disposition: A | Discharge status: Skilled Nursing Facility | Payer: Medicare Other | Attending: Internal Medicine | Admitting: Internal Medicine

## 2014-10-27 ENCOUNTER — Emergency Department (HOSPITAL_COMMUNITY): Payer: Medicare Other

## 2014-10-27 DIAGNOSIS — I10 Essential (primary) hypertension: Secondary | ICD-10-CM | POA: Diagnosis not present

## 2014-10-27 DIAGNOSIS — D509 Iron deficiency anemia, unspecified: Secondary | ICD-10-CM | POA: Diagnosis not present

## 2014-10-27 DIAGNOSIS — K219 Gastro-esophageal reflux disease without esophagitis: Secondary | ICD-10-CM | POA: Insufficient documentation

## 2014-10-27 DIAGNOSIS — Z6831 Body mass index (BMI) 31.0-31.9, adult: Secondary | ICD-10-CM | POA: Insufficient documentation

## 2014-10-27 DIAGNOSIS — F329 Major depressive disorder, single episode, unspecified: Secondary | ICD-10-CM | POA: Insufficient documentation

## 2014-10-27 DIAGNOSIS — S0003XA Contusion of scalp, initial encounter: Secondary | ICD-10-CM | POA: Insufficient documentation

## 2014-10-27 DIAGNOSIS — W19XXXA Unspecified fall, initial encounter: Secondary | ICD-10-CM

## 2014-10-27 DIAGNOSIS — E669 Obesity, unspecified: Secondary | ICD-10-CM | POA: Diagnosis not present

## 2014-10-27 DIAGNOSIS — R42 Dizziness and giddiness: Secondary | ICD-10-CM | POA: Diagnosis not present

## 2014-10-27 DIAGNOSIS — I5032 Chronic diastolic (congestive) heart failure: Secondary | ICD-10-CM | POA: Diagnosis not present

## 2014-10-27 DIAGNOSIS — Z79899 Other long term (current) drug therapy: Secondary | ICD-10-CM | POA: Insufficient documentation

## 2014-10-27 DIAGNOSIS — F32A Depression, unspecified: Secondary | ICD-10-CM | POA: Diagnosis present

## 2014-10-27 DIAGNOSIS — W1830XA Fall on same level, unspecified, initial encounter: Secondary | ICD-10-CM | POA: Diagnosis not present

## 2014-10-27 DIAGNOSIS — S060X0A Concussion without loss of consciousness, initial encounter: Secondary | ICD-10-CM

## 2014-10-27 DIAGNOSIS — Z7982 Long term (current) use of aspirin: Secondary | ICD-10-CM | POA: Insufficient documentation

## 2014-10-27 DIAGNOSIS — S161XXA Strain of muscle, fascia and tendon at neck level, initial encounter: Secondary | ICD-10-CM

## 2014-10-27 DIAGNOSIS — Z79891 Long term (current) use of opiate analgesic: Secondary | ICD-10-CM | POA: Insufficient documentation

## 2014-10-27 DIAGNOSIS — G4733 Obstructive sleep apnea (adult) (pediatric): Secondary | ICD-10-CM | POA: Diagnosis present

## 2014-10-27 DIAGNOSIS — R609 Edema, unspecified: Secondary | ICD-10-CM

## 2014-10-27 DIAGNOSIS — E785 Hyperlipidemia, unspecified: Secondary | ICD-10-CM | POA: Insufficient documentation

## 2014-10-27 LAB — BASIC METABOLIC PANEL
Anion gap: 10 (ref 5–15)
BUN: 11 mg/dL (ref 6–23)
CO2: 29 meq/L (ref 19–32)
CREATININE: 0.77 mg/dL (ref 0.50–1.10)
Calcium: 9.3 mg/dL (ref 8.4–10.5)
Chloride: 98 mEq/L (ref 96–112)
GFR calc Af Amer: >90 mL/min (ref >90)
GFR calc non Af Amer: 80 mL/min — ABNORMAL LOW (ref >90)
Glucose, Bld: 100 mg/dL — ABNORMAL HIGH (ref 70–99)
Potassium: 4.6 mEq/L (ref 3.7–5.3)
Sodium: 137 mEq/L (ref 137–147)

## 2014-10-27 LAB — URINALYSIS, ROUTINE W REFLEX MICROSCOPIC
Bilirubin Urine: NEGATIVE
GLUCOSE, UA: NEGATIVE mg/dL
HGB URINE DIPSTICK: NEGATIVE
Ketones, ur: NEGATIVE mg/dL
LEUKOCYTES UA: NEGATIVE
Nitrite: NEGATIVE
PH: 5.5 (ref 5.0–8.0)
Protein, ur: NEGATIVE mg/dL
Specific Gravity, Urine: 1.013 (ref 1.005–1.030)
Urobilinogen, UA: 0.2 mg/dL (ref 0.0–1.0)

## 2014-10-27 LAB — CBC
HCT: 35.1 % — ABNORMAL LOW (ref 36.0–46.0)
Hemoglobin: 10.8 g/dL — ABNORMAL LOW (ref 12.0–15.0)
MCH: 25.9 pg — ABNORMAL LOW (ref 26.0–34.0)
MCHC: 30.8 g/dL (ref 30.0–36.0)
MCV: 84.2 fL (ref 78.0–100.0)
Platelets: 235 10*3/uL (ref 150–400)
RBC: 4.17 MIL/uL (ref 3.87–5.11)
RDW: 14.5 % (ref 11.5–15.5)
WBC: 6.6 10*3/uL (ref 4.0–10.5)

## 2014-10-27 LAB — TROPONIN I
Troponin I: <0.3 ng/mL (ref <0.30)
Troponin I: <0.3 ng/mL (ref <0.30)

## 2014-10-27 LAB — LIPASE, BLOOD: Lipase: 13 U/L (ref 11–59)

## 2014-10-27 LAB — MRSA PCR SCREENING: MRSA by PCR: NEGATIVE

## 2014-10-27 MED ORDER — POLYSACCHARIDE IRON COMPLEX 150 MG PO CAPS
150.0000 mg | ORAL_CAPSULE | Freq: Every day | ORAL | Status: DC
  Administered 2014-10-27 – 2014-10-28 (×2): 150 mg via ORAL
  Filled 2014-10-27 (×2): qty 1

## 2014-10-27 MED ORDER — ALBUTEROL SULFATE HFA 108 (90 BASE) MCG/ACT IN AERS: 2.0000 | INHALATION_SPRAY | Freq: Four times a day (QID) | RESPIRATORY_TRACT | Status: DC | PRN

## 2014-10-27 MED ORDER — ENOXAPARIN SODIUM 40 MG/0.4ML ~~LOC~~ SOLN
40.0000 mg | SUBCUTANEOUS | Status: DC
  Administered 2014-10-27 – 2014-10-28 (×2): 40 mg via SUBCUTANEOUS
  Filled 2014-10-27 (×2): qty 0.4

## 2014-10-27 MED ORDER — PANTOPRAZOLE SODIUM 40 MG PO TBEC
40.0000 mg | DELAYED_RELEASE_TABLET | Freq: Every day | ORAL | Status: DC
  Administered 2014-10-27 – 2014-10-28 (×2): 40 mg via ORAL

## 2014-10-27 MED ORDER — FLUOXETINE HCL 20 MG PO CAPS
40.0000 mg | ORAL_CAPSULE | Freq: Every day | ORAL | Status: DC
  Administered 2014-10-27 – 2014-10-28 (×2): 40 mg via ORAL
  Filled 2014-10-27 (×2): qty 2

## 2014-10-27 MED ORDER — ASPIRIN EC 81 MG PO TBEC
81.0000 mg | DELAYED_RELEASE_TABLET | Freq: Every day | ORAL | Status: DC
  Administered 2014-10-27 – 2014-10-28 (×2): 81 mg via ORAL
  Filled 2014-10-27 (×3): qty 1

## 2014-10-27 MED ORDER — ROSUVASTATIN CALCIUM 20 MG PO TABS
20.0000 mg | ORAL_TABLET | ORAL | Status: DC
Start: 2014-10-27 — End: 2014-10-28
  Administered 2014-10-27: 20 mg via ORAL
  Filled 2014-10-27: qty 1

## 2014-10-27 MED ORDER — FLUOXETINE HCL 40 MG PO CAPS: 40.0000 mg | ORAL_CAPSULE | Freq: Every day | ORAL | Status: DC

## 2014-10-27 MED ORDER — SODIUM CHLORIDE 0.9 % IV SOLN
INTRAVENOUS | Status: DC
  Administered 2014-10-27 – 2014-10-28 (×3): via INTRAVENOUS

## 2014-10-27 MED ORDER — MECLIZINE HCL 25 MG PO TABS
25.0000 mg | ORAL_TABLET | Freq: Three times a day (TID) | ORAL | Status: DC
  Administered 2014-10-27 – 2014-10-28 (×4): 25 mg via ORAL
  Filled 2014-10-27 (×5): qty 1

## 2014-10-27 MED ORDER — ALBUTEROL SULFATE (2.5 MG/3ML) 0.083% IN NEBU: 2.5000 mg | INHALATION_SOLUTION | Freq: Four times a day (QID) | RESPIRATORY_TRACT | Status: DC | PRN

## 2014-10-27 MED ORDER — SODIUM CHLORIDE 0.9 % IJ SOLN: 3.0000 mL | Freq: Two times a day (BID) | INTRAMUSCULAR | Status: DC

## 2014-10-27 MED ORDER — FENTANYL 25 MCG/HR TD PT72
25.0000 ug | MEDICATED_PATCH | TRANSDERMAL | Status: DC
  Administered 2014-10-27: 25 ug via TRANSDERMAL
  Filled 2014-10-27: qty 1

## 2014-10-27 MED ORDER — SODIUM CHLORIDE 0.9 % IV BOLUS (SEPSIS)
500.0000 mL | Freq: Once | INTRAVENOUS | Status: AC
  Administered 2014-10-27: 500 mL via INTRAVENOUS

## 2014-10-27 NOTE — ED Notes (Addendum)
Spoke with MD regarding pt bed status. MD still discussing pt admission with hospitalist at this time. Pt still unassigned. MD will notify when report may be called. Charge notified.

## 2014-10-27 NOTE — ED Provider Notes (Signed)
CSN: [id number]     Arrival date & time 10/27/14  0419 History   First MD Initiated Contact with Patient 10/27/14 [phone]     Chief Complaint  Patient presents with  . Fall  . Dizziness      Patient is a 73 y.o. female presenting with fall and dizziness. The history is provided by the patient.  Fall This is a new problem. Episode onset: just prior to arrival. The problem has not changed since onset.Associated symptoms include headaches. Pertinent negatives include no chest pain, no abdominal pain and no shortness of breath. The symptoms are aggravated by standing. The symptoms are relieved by rest.  Dizziness Associated symptoms: headaches   Associated symptoms: no chest pain, no shortness of breath and no vomiting   Pt reports she was trying to stand up to use the restroom from her bed and felt dizzy and fell to the ground No LOC She did hit the crown of her head during the fall.  She has headache and neck pain No cp/sob While at rest, she has no dizziness However anytime she moves she has symptoms of vertigo  She reports these episodes of dizziness have been present for several weeks and she has fallen recently.  She is currently staying in nursing facility/rehab after back surgery She can usually ambulate during the day with walker, but at night episodes of dizziness will occur  No new back pain No cp/abd pain No new weakness No visual loss No diplopia  Past Medical History  Diagnosis Date  . Obesity   . Circadian rhythm sleep disorder   . CTS (carpal tunnel syndrome)   . Diarrhea   . IBS (irritable bowel syndrome)   . Diverticulitis of colon   . Gastritis   . Esophageal stricture   . Personal history of colonic polyps 10/25/2011 & 12/02/11    not retrieved Dr Lyla Son & tubular adenomas  . Hiatal hernia   . GERD (gastroesophageal reflux disease)   . Obstructive sleep apnea   . Insomnia   . HTN (hypertension)   . Hyperlipidemia   . Depression   . DIASTOLIC HEART  FAILURE, CHRONIC 04/04/2009    Qualifier: Diagnosis of  By: Romona Curls     Past Surgical History  Procedure Laterality Date  . Carpal tunnel release    . Cholecystectomy  2003  . Total knee arthroplasty      bilateral   Family History  Problem Relation Age of Onset  . Coronary artery disease Father   . Peripheral vascular disease Father   . Coronary artery disease Mother   . Coronary artery disease Brother   . Colon cancer Sister   . Emphysema Sister   . Sleep apnea Son   . Colon cancer Sister     spread to her brain   History  Substance Use Topics  . Smoking status: Never Smoker   . Smokeless tobacco: Never Used  . Alcohol Use: Yes     Comment: very rare   OB History    No data available     Review of Systems  Constitutional: Negative for fever.  Respiratory: Negative for shortness of breath.   Cardiovascular: Negative for chest pain.  Gastrointestinal: Negative for vomiting and abdominal pain.  Musculoskeletal: Positive for neck pain.  Neurological: Positive for dizziness and headaches. Negative for syncope.  All other systems reviewed and are negative.     Allergies  Morphine and related; Altace; Duloxetine; Floxin; Hydromorphone; Lipitor; Lovaza; Oxycodone-acetaminophen;  Percocet; Pravachol; Trilipix; Zetia; and Zocor  Home Medications   Prior to Admission medications   Medication Sig Start Date End Date Taking? Authorizing Provider  albuterol (PROVENTIL HFA;VENTOLIN HFA) 108 (90 BASE) MCG/ACT inhaler Inhale 2 puffs into the lungs every 6 (six) hours as needed for wheezing or shortness of breath.   Yes Historical Provider, MD  amLODipine (NORVASC) 2.5 MG tablet Take 1 tablet by mouth  daily. For HTN   Yes [name], MD  Calcium Carbonate-Vitamin D (CALCIUM-VITAMIN D) 500-200 MG-UNIT per tablet Take 1 tablet by mouth daily.   Yes Historical Provider, MD  diazepam (VALIUM) 5 MG tablet Take 5 mg by mouth every 6 (six) hours as needed for anxiety.    Yes Historical Provider, MD  diphenoxylate-atropine (LOMOTIL) 2.5-0.025 MG per tablet Take 1 tablet by mouth 4 (four) times daily as needed for diarrhea or loose stools.  08/17/14  Yes Historical Provider, MD  esomeprazole (NEXIUM) 40 MG capsule Take 1 capsule (40 mg total) by mouth 2 (two) times daily before a meal. For GERD 08/01/14  Yes [name], MD  Eszopiclone 3 MG TABS Take 1 tablet (3 mg total) by mouth at bedtime. For insomnia,take immediately before bedtime. 04/30/14  Yes [name], MD  fentaNYL (DURAGESIC - DOSED MCG/HR) 75 MCG/HR Place 75 mcg onto the skin every other day. Every other day   Yes Historical Provider, MD  FLUoxetine (PROZAC) 40 MG capsule Take 1 capsule (40 mg total) by mouth daily. For depression 04/30/14  Yes [name], MD  furosemide (LASIX) 20 MG tablet Take 20 mg by mouth daily.   Yes Historical Provider, MD  HYDROcodone-acetaminophen (NORCO/VICODIN) 5-325 MG per tablet Take 1 tablet by mouth every 4 (four) hours as needed for moderate pain.   Yes Historical Provider, MD  iron polysaccharides (NIFEREX) 150 MG capsule Take 150 mg by mouth daily.   Yes Historical Provider, MD  meclizine (ANTIVERT) 12.5 MG tablet Take 12.5 mg by mouth every 4 (four) hours as needed for dizziness.   Yes Historical Provider, MD  methocarbamol (ROBAXIN) 500 MG tablet Take 500 mg by mouth every 4 (four) hours as needed for muscle spasms.    Yes Historical Provider, MD  metoprolol tartrate (LOPRESSOR) 25 MG tablet Take 12.5 mg by mouth 2 (two) times daily.   Yes Historical Provider, MD  Multiple Vitamin (MULTIVITAMIN) tablet Take 1 tablet by mouth daily. For supplement   Yes Historical Provider, MD  PRESCRIPTION MEDICATION Take 120 mLs by mouth 3 (three) times daily. Med Pass   Yes Historical Provider, MD  rosuvastatin (CRESTOR) 10 MG tablet Alternate 10mg  & 20mg  daily as directed Patient taking differently: Take 10 mg by mouth every other day. Alternate with 20 mg 04/30/14  Yes  [name], MD  rosuvastatin (CRESTOR) 20 MG tablet Take 20 mg by mouth every other day. Alternate with 10 mg   Yes Historical Provider, MD  senna-docusate (SENOKOT-S) 8.6-50 MG per tablet Take 2 tablets by mouth at bedtime.   Yes Historical Provider, MD  ALPRAZolam (XANAX) 0.25 MG tablet Take 1 tablet (0.25 mg total) by mouth at bedtime as needed for anxiety. 12/20/13   [name], MD  aspirin EC 81 MG tablet Take 81 mg by mouth daily. For prophylasxis    Historical Provider, MD  colesevelam (WELCHOL) 625 MG tablet Take 625 mg by mouth daily with breakfast.    Historical Provider, MD  furosemide (LASIX) 20 MG tablet Take 1 tablet (  20 mg total) by mouth daily. 05/16/14   Larey Dresser, MD  HYDROmorphone (DILAUDID) 2 MG tablet Take 4 mg by mouth every 4 (four) hours as needed for severe pain.     Historical Provider, MD  hyoscyamine (LEVSIN SL) 0.125 MG SL tablet Place 0.125 mg under the tongue every 4 (four) hours as needed. For abd pain.    Historical Provider, MD   BP 107/49 mmHg  Pulse 55  Temp(Src) 98.1 F (36.7 C) (Oral)  Resp 15  Ht 5\' 7"  (1.702 m)  Wt 180 lb (81.647 kg)  BMI 28.19 kg/m2  SpO2 93% Physical Exam CONSTITUTIONAL: Well developed/well nourished HEAD: tenderness/hematoma to scalp.  No active bleeding EYES: EOMI/PERRL.  Small amt of swelling to left eyebrow from previous fall with bruising noted No nystagmus.  No ptosis ENMT: Mucous membranes moist, No evidence of facial/nasal trauma SPINE:cervical spine tenderness.  Well healed incision to low back.  No new tenderness to low back Patient maintained in spinal precautions/logroll utilized CV: S1/S2 noted, no murmurs/rubs/gallops noted LUNGS: Lungs are clear to auscultation bilaterally, no apparent distress ABDOMEN: soft, nontender, no rebound or guarding GU:no cva tenderness NEURO: Pt is awake/alert, moves all extremitiesx4.  No facial droop.  No arm/leg drift.  GCS 15 EXTREMITIES: pulses normal, full ROM, All  extremities/joints palpated/ranged and nontender SKIN: warm, color normal PSYCH: no abnormalities of mood noted  ED Course  Procedures   7:20 AM Pt reports episodes usually occur at night During the day she can ambulate with walker without dizziness It is noted from NH paperwork that she was given lunesta/valium/norco in the evening She also utiizes fentanyl patches Suspect this may be medication related.   Daughters report that this seems to occur with valium use Pt still reports dizziness with minimal movement  7:35 AM Imaging negative Pt stable At signout to dr ward, followup on urinalysis.  Pt can be ambulated.  If she fails ambulation test, she will likely need admitted She has no current signs of acute CVA (pt woke up with symptoms, no focal weakness, has had episode of dizziness intermittently for weeks) I doubt acute cardiac/dysrhythmia as cause of fall  Labs Review Labs Reviewed  CBC - Abnormal; Notable for the following:    Hemoglobin 10.8 (*)    HCT 35.1 (*)    MCH 25.9 (*)    All other components within normal limits  BASIC METABOLIC PANEL - Abnormal; Notable for the following:    Glucose, Bld 100 (*)    GFR calc non Af Amer 80 (*)    All other components within normal limits  LIPASE, BLOOD  TROPONIN I  URINALYSIS, ROUTINE W REFLEX MICROSCOPIC    Imaging Review Ct Head Wo Contrast  10/27/2014   CLINICAL DATA:  Dizzy and fell about a 12/2013 this morning. Hematoma to the top of the head. Head and neck pain in the posterior occipital region.  EXAM: CT HEAD WITHOUT CONTRAST  TECHNIQUE: Contiguous axial images were obtained from the base of the skull through the vertex without intravenous contrast.  COMPARISON:  05/21/2012  FINDINGS: Diffuse cerebral atrophy. Mild low attenuation change in the deep white matter consistent with small vessel ischemia. No mass effect or midline shift. No abnormal extra-axial fluid collections. Gray-white matter junctions are distinct.  Basal cisterns are not effaced. No evidence of acute intracranial hemorrhage. No depressed skull fractures. Visualized paranasal sinuses and mastoid air cells are not opacified. Vascular calcifications. Nodular soft tissue changes along the scalp with calcification  consistent with sebaceous cysts. No change since prior study.  IMPRESSION: No acute intracranial abnormalities. Chronic atrophy and small vessel ischemic changes.   Electronically Signed   By: Lucienne Capers M.D.   On: 10/27/2014 05:43     EKG Interpretation   Date/Time:  Saturday October 27 2014 04:23:38 EST Ventricular Rate:  57 PR Interval:  156 QRS Duration: 83 QT Interval:  457 QTC Calculation: 445 R Axis:   40 Text Interpretation:  Sinus rhythm Sinus bradycardia artifact noted No  significant change since last tracing Confirmed by Christy Gentles  MD, Elenore Rota  [phone]) on 10/27/2014 5:08:46 AM      MDM   Final diagnoses:  Cervical strain, acute, initial encounter  Vertigo  Concussion, without loss of consciousness, initial encounter    Nursing notes including past medical history and social history reviewed and considered in documentation Labs/vital reviewed and considered     Sharyon Cable, MD 10/27/14 [phone]

## 2014-10-27 NOTE — ED Notes (Signed)
Patient transported to CT 

## 2014-10-27 NOTE — Progress Notes (Signed)
NURSING PROGRESS NOTE  [name] [name] [id number] Admission Data: 10/27/2014 12:08 PM Attending Provider: Annia Belt, MD [id number], DONALD, MD Code Status: [name] [name] is a 73 y.o. female patient admitted from ED:  -No acute distress noted.  -No complaints of shortness of breath.  -No complaints of chest pain.    Blood pressure 124/67, pulse 57, temperature 98.5 F (36.9 C), temperature source Oral, resp. rate 18, height 5\' 6"  (1.676 m), weight 88.769 kg (195 lb 11.2 oz), SpO2 96 %.   IV Fluids:  IV in place, occlusive dsg intact without redness, IV cath antecubital right, condition patent and no redness normal saline.   Allergies:  Morphine and related; Altace; Duloxetine; Floxin; Hydromorphone; Lipitor; Lovaza; Oxycodone-acetaminophen; Percocet; Pravachol; Trilipix; Zetia; and Zocor  Past Medical History:   has a past medical history of Obesity; Circadian rhythm sleep disorder; CTS (carpal tunnel syndrome); Diarrhea; IBS (irritable bowel syndrome); Diverticulitis of colon; Gastritis; Esophageal stricture; Personal history of colonic polyps (10/25/2011 & 12/02/11); Hiatal hernia; GERD (gastroesophageal reflux disease); Obstructive sleep apnea; Insomnia; HTN (hypertension); Hyperlipidemia; Depression; and DIASTOLIC HEART FAILURE, CHRONIC (04/04/2009).  Past Surgical History:   has past surgical history that includes Carpal tunnel release; Cholecystectomy (2003); and Total knee arthroplasty.   Skin: intact. Healed mid back incision. No drainage or s/s of infection noted. Bruise noted to L side of face - pt states "when i fell"  Patient/Family orientated to room. Information packet given to patient/family. Admission inpatient armband information verified with patient/family to include name and date of birth and placed on patient arm. Side rails up x 2, fall assessment and education completed with patient/family. Patient/family able to verbalize understanding of risk  associated with falls and verbalized understanding to call for assistance before getting out of bed. Call light within reach. Patient/family able to voice and demonstrate understanding of unit orientation instructions.    Will continue to evaluate and treat per MD orders.  Wallie Renshaw, RN

## 2014-10-27 NOTE — ED Notes (Signed)
Pt. Was feeling dizzy at nursing home and fell and hit her head. Pt. Denies LOC. There is a hematoma present on top of head. Pt. Denies nausea and vomiting. Pt. Has a hx of vertigo. On EMS arrival pt. BP while sitting was 154/88. Then dropped to 121/64 while standing. Pt. Is alert and oriented x4.

## 2014-10-27 NOTE — ED Notes (Signed)
C-Collar placed on pt due to c/o neck pain

## 2014-10-27 NOTE — ED Notes (Signed)
MD at bedside. 

## 2014-10-27 NOTE — ED Notes (Signed)
Report given to 5W RN

## 2014-10-27 NOTE — ED Provider Notes (Addendum)
7:55 AM  Assumed care from Dr. Christy Gentles.  Pt is a 73 y.o. F with history of hypertension, hyperlipidemia, CHF who is currently living at a rehabilitation facility after back surgery who normally ambulates with a walker has had 2 weeks of intermittent vertigo that was worse last night. She had a fall today. No new injury on exam. Head and neck CT showed no acute abnormality. Labs including troponin normal. No urinary tract infection. She was not orthostatic in the emergency department. Family reports patient received Lunesta, Valium, Norco at 9 PM last night. She is also wearing a fentanyl patch. They believe this may be polypharmacy related. Patient has been in the emergency department several hours and is still symptomatic and unable to ambulate. Patient cannot even stand at the side of the bed without becoming extremely symptomatic. Per Dr. Christy Gentles, otherwise her neurologic exam is nonfocal. He thinks this is less likely stroke. Head CT showed no acute abnormalities.   Plan is for admission to medicine.   8:10 AM  Pt's PCP is Redge Gainer with [facility].  Admitted to unassigned IM.  Residents to admit.  9:13 AM  IM residents state [place] is assigned to [facility].  D/w Dr. Wynelle Cleveland for admission to medical obs bed.  9:25 AM  House sup confirmed that pt is unassigned.  Dr. Wynelle Cleveland updated.  She will be admitted to IM teaching service to Dr. Azucena Freed service.  [place], DO 10/27/14 [place], DO 10/27/14 [place], DO 10/27/14 [id number]

## 2014-10-27 NOTE — ED Notes (Signed)
Attempted to walk pt with assistance. Pt reports "too dizzy to sit up." Pt swaying while trying to sit her up. MD notified.

## 2014-10-27 NOTE — H&P (Signed)
Date: 10/27/2014               Patient Name:  [name] [name] MRN: [id number]  DOB: [birth year] Age / Sex: 73 y.o., female   PCP: [name], MD         Medical Service: Internal Medicine Teaching Service         Attending Physician: Dr. Annia Belt, MD    First Contact: Dr. Charlott Rakes Pager: [phone]  Second Contact: Dr. Bing Neighbors Pager: [phone]       After Hours (After 5p/  First Contact Pager: [phone]  weekends / holidays): Second Contact Pager: [phone]   Chief Complaint: fall, dizziness  History of Present Illness: [name] [name] is a 73 year old female with HTN, CAD, HLD, spinal stenosis s/p spinal fusion with rods (Feb 2015) who presents with fall and dizziness. Her daughters, [name] [name] and [name] [name], were present at bedside and contributed to the interview.  Last night, she reported feeling dizzy when she got up to go use the bathroom. She felt "the room started spinning" and remembers being "jarred" by the door of the bathroom before falling on the floor and hitting her head and neck. She feels this sensation whenever she gets up quickly or lies down quickly.  Per the daughters, she is coming from a rehab facility and is able to ambulated with a walker. She has had two back surgeries, one in February and one in late September. Both times, she was given Valium post-operatively after which she experienced intermittent dizziness/confusion which resolved the first time when it was discontinued one week post-op. After her most recent surgery however, the medication was continued, and they feel her symptoms have worsened. She had a small CVA about 10 years ago.  In addition to a family medicine doctor (Dr. Laurance Flatten), she sees a cardiologist and pain specialist. The majority of her medications are prescribed by her PCP and included Lunesta, Norco, Dilaudid, Soma, Robaxin.    She reports a headache from the pain in her head, back pain, hearing loss (chronic), dizziness,  diarrhea (IBS), palpitations when she gets dizzy but no changes in vision, tinnitus, chest pain, abdominal pain, nausea, vomiting, focal deficits.   In the ED, she received a 500cc NS bolus.   Meds: Current Facility-Administered Medications  Medication Dose Route Frequency Provider Last Rate Last Dose  . 0.9 %  sodium chloride infusion   Intravenous Continuous Kristen N Ward, DO 75 mL/hr at 10/27/14 3785      Allergies: Allergies as of 10/27/2014 - Review Complete 10/27/2014  Allergen Reaction Noted  . Morphine and related Itching 05/21/2012  . Altace [ramipril]  04/13/2013  . Duloxetine  06/25/2010  . Floxin [ofloxacin]  04/13/2013  . Hydromorphone Other (See Comments) 10/27/2014  . Lipitor [atorvastatin]  04/13/2013  . Lovaza [omega-3-acid ethyl esters]  04/13/2013  . Oxycodone-acetaminophen  05/22/2008  . Percocet [oxycodone-acetaminophen]  04/13/2013  . Pravachol [pravastatin sodium]  04/13/2013  . Trilipix [choline fenofibrate]  04/13/2013  . Zetia [ezetimibe]  04/13/2013  . Zocor [simvastatin]  04/13/2013   Past Medical History  Diagnosis Date  . Obesity   . Circadian rhythm sleep disorder   . CTS (carpal tunnel syndrome)   . Diarrhea   . IBS (irritable bowel syndrome)   . Diverticulitis of colon   . Gastritis   . Esophageal stricture   . Personal history of colonic polyps 10/25/2011 & 12/02/11    not retrieved Dr Lyla Son & tubular adenomas  .  Hiatal hernia   . GERD (gastroesophageal reflux disease)   . Obstructive sleep apnea   . Insomnia   . HTN (hypertension)   . Hyperlipidemia   . Depression   . DIASTOLIC HEART FAILURE, CHRONIC 04/04/2009    Qualifier: Diagnosis of  By: Romona Curls     Past Surgical History  Procedure Laterality Date  . Carpal tunnel release    . Cholecystectomy  2003  . Total knee arthroplasty      bilateral   Family History  Problem Relation Age of Onset  . Coronary artery disease Father   . Peripheral vascular disease  Father   . Coronary artery disease Mother   . Coronary artery disease Brother   . Colon cancer Sister   . Emphysema Sister   . Sleep apnea Son   . Colon cancer Sister     spread to her brain   History   Social History  . Marital Status: Widowed    Spouse Name: N/A    Number of Children: 2  . Years of Education: N/A   Occupational History  . librarian- retired     retired   Social History Main Topics  . Smoking status: Never Smoker   . Smokeless tobacco: Never Used  . Alcohol Use: Yes     Comment: very rare  . Drug Use: No  . Sexual Activity: Not on file   Other Topics Concern  . Not on file   Social History Narrative    Review of Systems: Review of Systems  Constitutional: Negative for fever.  HENT: Negative for congestion and tinnitus.   Eyes:       No changes in vision  Cardiovascular: Positive for palpitations. Negative for chest pain.       Mainly when she gets dizzy  Gastrointestinal: Positive for diarrhea. Negative for abdominal pain.  Musculoskeletal: Positive for back pain and falls.  Neurological: Positive for dizziness and headaches.     Physical Exam: Blood pressure 124/67, pulse 57, temperature 98.5 F (36.9 C), temperature source Oral, resp. rate 18, height 5\' 6"  (1.676 m), weight 195 lb 11.2 oz (88.769 kg), SpO2 96 %.  General: resting in bed, NAD HEENT: PERRL, EOMI, no scleral icterus, attempted Dix-Hallpike maneuver but unable to see nystagmus though worsen dizziness Cardiac: RRR, no rubs, murmurs or gallops Pulm: clear to auscultation bilaterally, no wheezes, rales, or rhonchi Abd: soft, nontender, nondistended, BS present Ext: scars present along her spinal column and knees bilaterally, warm and well perfused, trace pitting edema BLE, R>L though without warmth, erythematous lesion overlying R lateral leg just superior to ankle Neuro: alert and oriented to name Darrick Grinder), place [facility]), and year (2015) but not to month  (November by multiple choice), date ("a few days into the month"), cranial nerves II-XII intact   Lab results: Basic Metabolic Panel:  Recent Labs  10/27/14 0455  NA 137  K 4.6  CL 98  CO2 29  GLUCOSE 100*  BUN 11  CREATININE 0.77  CALCIUM 9.3    Recent Labs  10/27/14 0455  LIPASE 13   CBC:  Recent Labs  10/27/14 0455  WBC 6.6  HGB 10.8*  HCT 35.1*  MCV 84.2  PLT 235   Cardiac Enzymes:  Recent Labs  10/27/14 0533  TROPONINI <0.30   Urinalysis:  Recent Labs  10/27/14 0729  COLORURINE YELLOW  LABSPEC 1.013  PHURINE 5.5  GLUCOSEU NEGATIVE  HGBUR NEGATIVE  BILIRUBINUR NEGATIVE  KETONESUR NEGATIVE  PROTEINUR  NEGATIVE  UROBILINOGEN 0.2  NITRITE NEGATIVE  LEUKOCYTESUR NEGATIVE    Imaging results:  Ct Head Wo Contrast  10/27/2014   CLINICAL DATA:  Dizzy and fell about a 12/2013 this morning. Hematoma to the top of the head. Head and neck pain in the posterior occipital region.  EXAM: CT HEAD WITHOUT CONTRAST  TECHNIQUE: Contiguous axial images were obtained from the base of the skull through the vertex without intravenous contrast.  COMPARISON:  05/21/2012  FINDINGS: Diffuse cerebral atrophy. Mild low attenuation change in the deep white matter consistent with small vessel ischemia. No mass effect or midline shift. No abnormal extra-axial fluid collections. Gray-white matter junctions are distinct. Basal cisterns are not effaced. No evidence of acute intracranial hemorrhage. No depressed skull fractures. Visualized paranasal sinuses and mastoid air cells are not opacified. Vascular calcifications. Nodular soft tissue changes along the scalp with calcification consistent with sebaceous cysts. No change since prior study.  IMPRESSION: No acute intracranial abnormalities. Chronic atrophy and small vessel ischemic changes.   Electronically Signed   By: Lucienne Capers M.D.   On: 10/27/2014 05:43   Ct Cervical Spine Wo Contrast  10/27/2014   CLINICAL DATA:   73 year old female with dizziness resulting in fall earlier this morning. Patient complains of head and neck pain. Scalp hematoma.  EXAM: CT CERVICAL SPINE WITHOUT CONTRAST  TECHNIQUE: Multidetector CT imaging of the cervical spine was performed without intravenous contrast. Multiplanar CT image reconstructions were also generated.  COMPARISON:  Concurrently obtained CT scan of the head earlier today  FINDINGS: No acute fracture, malalignment or prevertebral soft tissue swelling. Unremarkable CT appearance of the thyroid gland. No acute soft tissue abnormality. The lung apices are unremarkable. Incompletely imaged posterior stabilization hardware beginning at T3. Multilevel cervical spondylosis including multilevel degenerative disc disease and uncovertebral joint hypertrophy without focality. Additionally, there is bilateral left slightly worse than right facet arthropathy across multiple levels. Degenerative changes also noted about the atlantodental interval. Normal bony mineralization. No lytic or blastic osseous lesion.  IMPRESSION: 1. No evidence of acute fracture or malalignment. 2. Multilevel cervical spondylosis and facet arthropathy without focality. 3. Atherosclerotic vascular calcifications in both carotid arteries.   Electronically Signed   By: Jacqulynn Cadet M.D.   On: 10/27/2014 07:12    Other results: EKG: Reviewed and compared with 08/29/14 Normal sinus rhythm Bradycardic: HR 55   Assessment & Plan by Problem: Principal Problem:   Vertigo Active Problems:   Obesity (BMI 30.0-34.9)   Depression   Obstructive sleep apnea   Essential hypertension  Ms. Mullan is a 73 year old female with HTN, CAD, HLD, spinal stenosis s/p spinal fusion with rods (Feb 2015) who presents with a fall 2/2 vertigo found to have anemia, bradycardia, and LE edema.  Vertigo: Differential includes polypharmacy, BPPV, TIA, cardiogenic. Polypharmacy suspect given the number of sedatives as well as how symptoms  resolved once Valium was discontinued following first surgery. BPPV possible given nature of symptoms though Dix-Hallpike unremarkable for nystagmus and time course does not fit. Cardiogenic possible given the associated palpitations when does feel dizzy and initial EKG with bradycardia. TIA possible given her risk factors and prior CVA though she does not report focal deficits. Head CT reassuring for no hemorrhage.  -Monitor on telemetry -Continue Fentanyl patch 23mcg but hold other sedatives, including Valium -Collect MAR from rehab facility -Continue Antivert 25mg  -Consult PT -Continue Neuro Checks q4h -Hold HTN meds: amlodipine, metoprolol, Lasix  Anemia: Normocytic but presumably Fe deficiency based off her home medication.  Per family, she has received blood transfusions and were seeking a hematologist. -Continue home Niferix -Review chart for more information  LE edema: DVT possible given asymmetry though leg did not appear inflamed. -Check LE Dupplex  HLD: Continue Crestor.  CAD: Continue ASA 81mg .  Depression: Continue Prozac.  #FEN:  -Diet: Heart Healthy -NS @ 75cc/hr  #DVT prophylaxis: Lovenox  #CODE STATUS: FULL CODE  Dispo: Disposition is deferred at this time, awaiting improvement of current medical problems. Anticipated discharge in approximately 1-2 day(s).   The patient does have a current PCP [name], MD) and does not need an [facility] hospital follow-up appointment after discharge.  The patient does have transportation limitations that hinder transportation to clinic appointments.  Signed: Charlott Rakes, MD 10/27/2014, 10:59 AM

## 2014-10-27 NOTE — Progress Notes (Signed)
Attempted to get report from [facility] who informed me that ED MD verbal order not to give report to 5w until patient has assigned MD. Shonna Chock states that she will call 5w back

## 2014-10-28 DIAGNOSIS — R42 Dizziness and giddiness: Secondary | ICD-10-CM

## 2014-10-28 DIAGNOSIS — M7989 Other specified soft tissue disorders: Secondary | ICD-10-CM

## 2014-10-28 MED ORDER — FENTANYL 25 MCG/HR TD PT72: 25.0000 ug | MEDICATED_PATCH | TRANSDERMAL | Status: DC

## 2014-10-28 MED ORDER — MECLIZINE HCL 25 MG PO TABS: 25.0000 mg | ORAL_TABLET | Freq: Three times a day (TID) | ORAL | Status: DC

## 2014-10-28 MED ORDER — MECLIZINE HCL 12.5 MG PO TABS: 25.0000 mg | ORAL_TABLET | ORAL | Status: DC | PRN

## 2014-10-28 MED ORDER — ROSUVASTATIN CALCIUM 20 MG PO TABS: 20.0000 mg | ORAL_TABLET | Freq: Every day | ORAL | Status: DC

## 2014-10-28 NOTE — Evaluation (Addendum)
Physical Therapy Evaluation Patient Details Name: [name] [name] MRN: [id number] DOB: 1941/07/05 Today's Date: 10/28/2014   History of Present Illness  Patient is a 73 yo female admitted 10/27/14 with dizziness and mult falls.  Patient with chronic back problems related to spinal stenosis. She had an initial surgical procedure in February of this year and a second procedure in September. She is currently in a rehabilitation facility since Sept 29 following back surgery.  She presents with clearly orthostatic dizziness resulting in a fall at the rehabilitation facility. There is a strong suspicion that this may be medication related per chart.  Clinical Impression  Patient presents with pain, weakness, and decreased mobility.  Patient with no dizziness during entire PT session.  Vestibular testing negative (see below).  Feel patient can return to SNF at discharge to receive needed PT to address problems listed below.  Patient to return to SNF.    Follow Up Recommendations SNF;Supervision/Assistance - 24 hour    Equipment Recommendations  None recommended by PT    Recommendations for Other Services       Precautions / Restrictions Precautions Precautions: Fall;Back Restrictions Weight Bearing Restrictions: No      Mobility  Bed Mobility Overal bed mobility: Needs Assistance Bed Mobility: Rolling;Sidelying to Sit;Sit to Sidelying Rolling: Modified independent (Device/Increase time) Sidelying to sit: Min assist     Sit to sidelying: Min guard General bed mobility comments: Verbal cues for technique.  Patient using rail to roll.  Assist to move trunk to sitting.  Once upright, patient with good balance.  Patient able to return to sidelying with min guard assist for safety.  Transfers Overall transfer level: Needs assistance Equipment used: Rolling walker (2 wheeled) Transfers: Sit to/from Stand Sit to Stand: Min assist         General transfer comment: Verbal cues for hand  placement.  Assist to power up to standing.  Assist for balance - improved with use of RW.  Performed orthostatic BP's - see vital sign section.  No orthostasis noted.  Ambulation/Gait Ambulation/Gait assistance: Min assist Ambulation Distance (Feet): 86 Feet Assistive device: Rolling walker (2 wheeled) Gait Pattern/deviations: Step-through pattern;Decreased step length - right;Decreased step length - left;Decreased stride length;Trunk flexed Gait velocity: Decreased Gait velocity interpretation: Below normal speed for age/gender General Gait Details: Verbal cues for safe use of RW.  Cues to stand upright during gait.  Balance slightly decreased with gait.  Stairs            Wheelchair Mobility    Modified Rankin (Stroke Patients Only)       Balance                                             Pertinent Vitals/Pain Pain Assessment: 0-10 Pain Score: 6  Pain Location: Back (chronic), and neck/head (acute from fall) Pain Descriptors / Indicators: Aching;Sore Pain Intervention(s): Monitored during session;Repositioned    Home Living Family/patient expects to be discharged to:: Skilled nursing facility                 Additional Comments: Patient to return to [facility] for continued therapy at discharge.    Prior Function Level of Independence: Needs assistance   Gait / Transfers Assistance Needed: Patient receiving gait training at SNF with RW.  ADL's / Homemaking Assistance Needed: Assist with ADL's  Hand Dominance        Extremity/Trunk Assessment   Upper Extremity Assessment: Generalized weakness           Lower Extremity Assessment: Generalized weakness         Communication   Communication: No difficulties  Cognition Arousal/Alertness: Awake/alert Behavior During Therapy: Anxious Overall Cognitive Status: Within Functional Limits for tasks assessed                      General Comments       Vestibular Evaluation *Oculomotor:  Normal smooth pursuits and saccades.  Good ROM. *Head thrust:  Tested normal to both sides. *Roll Test:  No nystagmus or dizziness during test to both sides. *Modified Dix-Hallpike:  Tested using trendelenberg position of bed due to prior surgery.  No nystagmus or dizziness to both directions.      Assessment/Plan    PT Assessment All further PT needs can be met in the next venue of care  PT Diagnosis Abnormality of gait;Difficulty walking;Acute pain   PT Problem List Decreased strength;Decreased activity tolerance;Decreased balance;Decreased mobility;Decreased knowledge of use of DME;Obesity;Pain  PT Treatment Interventions     PT Goals (Current goals can be found in the Care Plan section) Acute Rehab PT Goals Patient Stated Goal: To get stronger and eventually return home.    Frequency     Barriers to discharge        Co-evaluation               End of Session Equipment Utilized During Treatment: Gait belt Activity Tolerance: Patient limited by fatigue Patient left: in bed;with call bell/phone within reach;with bed alarm set;with family/visitor present Nurse Communication: Mobility status    Functional Assessment Tool Used: Clinical judgement Functional Limitation: Mobility: Walking and moving around Mobility: Walking and Moving Around Current Status (U1314): At least 20 percent but less than 40 percent impaired, limited or restricted Mobility: Walking and Moving Around Goal Status [phone]): At least 20 percent but less than 40 percent impaired, limited or restricted Mobility: Walking and Moving Around Discharge Status [phone]): At least 20 percent but less than 40 percent impaired, limited or restricted    Time: 1240-1330 PT Time Calculation (min): 50 min   Charges:   PT Evaluation $Initial PT Evaluation Tier I: 1 Procedure PT Treatments $Gait Training: 8-22 mins $Therapeutic Activity: 8-22 mins   PT G Codes:   Functional  Assessment Tool Used: Clinical judgement Functional Limitation: Mobility: Walking and moving around    Despina Pole 10/28/2014, 2:00 PM Carita Pian. Sanjuana Kava, [place] Pager [phone]

## 2014-10-28 NOTE — Progress Notes (Signed)
VASCULAR LAB PRELIMINARY  PRELIMINARY  PRELIMINARY  PRELIMINARY  Bilateral lower extremity venous Dopplers completed.    Preliminary report:  There is no DVT or SVT noted in the bilateral lower extremities.   Lynnsey Barbara, RVT 10/28/2014, 11:51 AM

## 2014-10-28 NOTE — Progress Notes (Signed)
Report given to nurse at [employer] [facility] [employer]

## 2014-10-28 NOTE — Discharge Instructions (Signed)
Thank you for trusting [place] with your medical care!  You were hospitalized for dizziness which we think was related to some of your medications. We made the following changes: -Valium was stopped. -Fentanyl was decreased to 36mcg/hour. Please change every THREE days (NOT every TWO days). -Crestor should be taken 20mg  every day (NOT alternated with 10mg ). -Antivert was increased to 25mg  THREE times a day to help with dizziness  Please follow-up with your family doctor, Dr. Laurance Flatten, to discuss these changes within the next week if possible.

## 2014-10-28 NOTE — Plan of Care (Signed)
Problem: Phase II Progression Outcomes Goal: Obtain order to discontinue catheter if appropriate Outcome: Not Applicable Date Met:  10/28/14     

## 2014-10-28 NOTE — Plan of Care (Signed)
Problem: Phase I Progression Outcomes Goal: Voiding-avoid urinary catheter unless indicated Outcome: Completed/Met Date Met:  10/28/14

## 2014-10-28 NOTE — Clinical Social Work Note (Signed)
CSW made aware patient ready for d/c to [facility]. CSW contacted facility and spoke with Judson Roch, who confirmed patient can return on this date. CSW spoke with patient's daughter [name] Buccheri who was present at beside regarding d/c. Patient's daughter agreeable to d/c plan. CSW faxed d/c summary to facility and prepared d/c packet. CSW provided patient's RN, Vaughan Basta with number for report. CSW to arrange transportation via [place]. No further needs. CSW signing off.  [place], [place] Weekend Clinical Social Worker [phone]

## 2014-10-28 NOTE — Progress Notes (Signed)
PTAR called to pick up patient to return to her facility. PTAR present, VS checked, and patient placed on stretcher to transport. [facility] RN, Loma Sousa, called to notify that patient is on her way back. All paperwork given along with prescriptions and all questions answered for patient.

## 2014-10-28 NOTE — Progress Notes (Signed)
UR completed 

## 2014-10-28 NOTE — Discharge Summary (Signed)
Name: [name] [name] MRN: [id number] DOB: May 03, 1941 73 y.o. PCP: [name], MD  Date of Admission: 10/27/2014  4:19 AM Date of Discharge: 10/28/2014 Attending Physician: Murriel Hopper, MD  Discharge Diagnosis: Principal Problem:   Vertigo Active Problems:   Obesity (BMI 30.0-34.9)   Depression   Obstructive sleep apnea   Essential hypertension  Discharge Medications:   Medication List    STOP taking these medications        ALPRAZolam 0.25 MG tablet  Commonly known as:  XANAX     diazepam 5 MG tablet  Commonly known as:  VALIUM     fentaNYL 75 MCG/HR  Commonly known as:  DURAGESIC - dosed mcg/hr  Replaced by:  fentaNYL 25 MCG/HR patch     HYDROmorphone 2 MG tablet  Commonly known as:  DILAUDID     hyoscyamine 0.125 MG SL tablet  Commonly known as:  LEVSIN SL     PRESCRIPTION MEDICATION      TAKE these medications        albuterol 108 (90 BASE) MCG/ACT inhaler  Commonly known as:  PROVENTIL HFA;VENTOLIN HFA  Inhale 2 puffs into the lungs every 6 (six) hours as needed for wheezing or shortness of breath.     amLODipine 2.5 MG tablet  Commonly known as:  NORVASC  Take 1 tablet by mouth  daily. For HTN     aspirin EC 81 MG tablet  Take 81 mg by mouth daily. For prophylasxis     calcium-vitamin D 500-200 MG-UNIT per tablet  Take 1 tablet by mouth daily.     colesevelam 625 MG tablet  Commonly known as:  WELCHOL  Take 625 mg by mouth daily with breakfast.     diphenoxylate-atropine 2.5-0.025 MG per tablet  Commonly known as:  LOMOTIL  Take 1 tablet by mouth 4 (four) times daily as needed for diarrhea or loose stools.     esomeprazole 40 MG capsule  Commonly known as:  NEXIUM  Take 1 capsule (40 mg total) by mouth 2 (two) times daily before a meal. For GERD     Eszopiclone 3 MG Tabs  Take 1 tablet (3 mg total) by mouth at bedtime. For insomnia,take immediately before bedtime.     fentaNYL 25 MCG/HR patch  Commonly known as:  DURAGESIC -  dosed mcg/hr  Place 1 patch (25 mcg total) onto the skin every 3 (three) days.     FLUoxetine 40 MG capsule  Commonly known as:  PROZAC  Take 1 capsule (40 mg total) by mouth daily. For depression     furosemide 20 MG tablet  Commonly known as:  LASIX  Take 1 tablet (20 mg total) by mouth daily.     HYDROcodone-acetaminophen 5-325 MG per tablet  Commonly known as:  NORCO/VICODIN  Take 1 tablet by mouth every 4 (four) hours as needed for moderate pain.     iron polysaccharides 150 MG capsule  Commonly known as:  NIFEREX  Take 150 mg by mouth daily.     meclizine 25 MG tablet  Commonly known as:  ANTIVERT  Take 1 tablet (25 mg total) by mouth 3 (three) times daily.     methocarbamol 500 MG tablet  Commonly known as:  ROBAXIN  Take 500 mg by mouth every 4 (four) hours as needed for muscle spasms.     metoprolol tartrate 25 MG tablet  Commonly known as:  LOPRESSOR  Take 12.5 mg by mouth 2 (two) times daily.  multivitamin tablet  Take 1 tablet by mouth daily. For supplement     rosuvastatin 20 MG tablet  Commonly known as:  CRESTOR  Take 1 tablet (20 mg total) by mouth daily.     senna-docusate 8.6-50 MG per tablet  Commonly known as:  Senokot-S  Take 2 tablets by mouth at bedtime.        Disposition and follow-up:   Ms.Xiadani W Hardiman was discharged from [facility] in Stable condition.  At the hospital follow up visit please address:  1.  Vertigo: resolution of symptoms, adjustment of Antivert  2.  Pain control: adjustment of Fentanyl  3.  Polypharmacy: need for dual statin therapy  4.  Labs / imaging needed at time of follow-up: none  5.  Pending labs/ test needing follow-up: none  Follow-up Appointments: Follow-up Information    Follow up with Redge Gainer, MD. Schedule an appointment as soon as possible for a visit in 1 week.   Specialty:  Family Medicine   Contact information:   [place] [address] [phone]         Discharge Instructions: Discharge Instructions    Call MD for:  difficulty breathing, headache or visual disturbances    Complete by:  As directed      Call MD for:  extreme fatigue    Complete by:  As directed      Call MD for:  persistant dizziness or light-headedness    Complete by:  As directed      Call MD for:  severe uncontrolled pain    Complete by:  As directed      Diet - low sodium heart healthy    Complete by:  As directed      Increase activity slowly    Complete by:  As directed            Consultations:    Procedures Performed:  Ct Head Wo Contrast  10/27/2014   CLINICAL DATA:  Dizzy and fell about a 12/2013 this morning. Hematoma to the top of the head. Head and neck pain in the posterior occipital region.  EXAM: CT HEAD WITHOUT CONTRAST  TECHNIQUE: Contiguous axial images were obtained from the base of the skull through the vertex without intravenous contrast.  COMPARISON:  05/21/2012  FINDINGS: Diffuse cerebral atrophy. Mild low attenuation change in the deep white matter consistent with small vessel ischemia. No mass effect or midline shift. No abnormal extra-axial fluid collections. Gray-white matter junctions are distinct. Basal cisterns are not effaced. No evidence of acute intracranial hemorrhage. No depressed skull fractures. Visualized paranasal sinuses and mastoid air cells are not opacified. Vascular calcifications. Nodular soft tissue changes along the scalp with calcification consistent with sebaceous cysts. No change since prior study.  IMPRESSION: No acute intracranial abnormalities. Chronic atrophy and small vessel ischemic changes.   Electronically Signed   By: Lucienne Capers M.D.   On: 10/27/2014 05:43   Ct Cervical Spine Wo Contrast  10/27/2014   CLINICAL DATA:  73 year old female with dizziness resulting in fall earlier this morning. Patient complains of head and neck pain. Scalp hematoma.  EXAM: CT CERVICAL SPINE WITHOUT CONTRAST  TECHNIQUE:  Multidetector CT imaging of the cervical spine was performed without intravenous contrast. Multiplanar CT image reconstructions were also generated.  COMPARISON:  Concurrently obtained CT scan of the head earlier today  FINDINGS: No acute fracture, malalignment or prevertebral soft tissue swelling. Unremarkable CT appearance of the thyroid gland. No acute  soft tissue abnormality. The lung apices are unremarkable. Incompletely imaged posterior stabilization hardware beginning at T3. Multilevel cervical spondylosis including multilevel degenerative disc disease and uncovertebral joint hypertrophy without focality. Additionally, there is bilateral left slightly worse than right facet arthropathy across multiple levels. Degenerative changes also noted about the atlantodental interval. Normal bony mineralization. No lytic or blastic osseous lesion.  IMPRESSION: 1. No evidence of acute fracture or malalignment. 2. Multilevel cervical spondylosis and facet arthropathy without focality. 3. Atherosclerotic vascular calcifications in both carotid arteries.   Electronically Signed   By: Jacqulynn Cadet M.D.   On: 10/27/2014 07:12    Admission HPI: Ms. Kalp is a 73 year old female with HTN, CAD, HLD, spinal stenosis s/p spinal fusion with rods (Feb 2015) who presents with fall and dizziness. Her daughters, Floyde Parkins and Bee Rost, were present at bedside and contributed to the interview.  Last night, she reported feeling dizzy when she got up to go use the bathroom. She felt "the room started spinning" and remembers being "jarred" by the door of the bathroom before falling on the floor and hitting her head and neck. She feels this sensation whenever she gets up quickly or lies down quickly.  Per the daughters, she is coming from a rehab facility and is able to ambulated with a walker. She has had two back surgeries, one in February and one in late September. Both times, she was given Valium post-operatively after  which she experienced intermittent dizziness/confusion which resolved the first time when it was discontinued one week post-op. After her most recent surgery however, the medication was continued, and they feel her symptoms have worsened. She had a small CVA about 10 years ago.  In addition to a family medicine doctor (Dr. Laurance Flatten), she sees a cardiologist and pain specialist. The majority of her medications are prescribed by her PCP and included Lunesta, Norco, Dilaudid, Soma, Robaxin.   She reports a headache from the pain in her head, back pain, hearing loss (chronic), dizziness, diarrhea (IBS), palpitations when she gets dizzy but no changes in vision, tinnitus, chest pain, abdominal pain, nausea, vomiting, focal deficits.   In the ED, she received a 500cc NS bolus.   Hospital Course by problem list:   Vertigo: Likely 2/2 polypharmacy. C-spine CT and head CT were unremarkable for trauma. Valium was discontinued as it had been noted to cause similar symptoms in the past. Her Fentanyl patch was adjusted to 56mcg/hour q72h. Her other pain medications were held overnight, and her symptoms resolved today. She was not found to have orthostatic hypotension. Physical therapy was consulted and felt she could return to SNF. Upon return to the SNF, she should NOT resume Valium, take a decreased dose of Fentanyl (71mcg/hour q72h), but may resume the remainder of her pain medications. Her home dose of Antivert was increased to 25mg  TID to help with dizziness. For more information, please corroborate with the list of medications above. Benzodiazepines should be avoided in her if possible given the adverse effects she has experienced.  Fe deficiency anemia: Hb 10.8 on admission. Niferix was continued, and her anemia should be followed up as an outpatient.  Hyperlipidemia: She was continued on Crestor 20mg  and should resume ONLY this dose at discharge. STOP Crestor 10mg  altogether.  GERD: Remained stable on home  medications.  Depression: Remained stable on home medications.  HTN: Remained stable on home medications.    Discharge Vitals:   BP 148/58 mmHg  Pulse 66  Temp(Src) 98.4 F (36.9  C) (Oral)  Resp 18  Ht 5\' 6"  (1.676 m)  Wt 195 lb 11.2 oz (88.769 kg)  BMI 31.60 kg/m2  SpO2 95%  Discharge Labs:  Results for orders placed or performed during the hospital encounter of 10/27/14 (from the past 24 hour(s))  MRSA PCR Screening     Status: None   Collection Time: 10/27/14  5:35 PM  Result Value Ref Range   MRSA by PCR NEGATIVE NEGATIVE  Troponin I     Status: None   Collection Time: 10/27/14  9:28 PM  Result Value Ref Range   Troponin I <0.30 <0.30 ng/mL    Signed: Charlott Rakes, MD 10/28/2014, 3:27 PM    Services Ordered on Discharge: SNF Equipment Ordered on Discharge: None

## 2014-10-28 NOTE — Progress Notes (Addendum)
Subjective: LE Dopplers unremarkable for DVT.  This PM, she was in bed eating lunch. Daughters and grandson were present at bedside.  She feels better, and her daughters report their mother is able to stand up without dizziness. Per PT, she is stable for discharge back to SNF.    Objective: Vital signs in last 24 hours: Filed Vitals:   10/27/14 1005 10/27/14 1314 10/27/14 2111 10/28/14 0523  BP: 124/67 123/50 132/63 148/58  Pulse: 57 63 69 66  Temp: 98.5 F (36.9 C) 97.8 F (36.6 C) 98.4 F (36.9 C) 98.4 F (36.9 C)  TempSrc: Oral Oral Oral Oral  Resp: 18 20 18 18   Height: 5\' 6"  (1.676 m)     Weight: 195 lb 11.2 oz (88.769 kg)     SpO2: 96% 98% 93% 95%   Weight change: 15 lb 11.2 oz (7.121 kg)  Intake/Output Summary (Last 24 hours) at 10/28/14 1554 Last data filed at 10/28/14 1358  Gross per 24 hour  Intake    600 ml  Output   2000 ml  Net  -1400 ml   General: resting in bed, NAD HEENT: PERRL, EOMI, no scleral icterus Cardiac: RRR, no rubs, murmurs or gallops Pulm: clear to auscultation bilaterally, no wheezes, rales, or rhonchi Abd: soft, nontender, nondistended, BS present Ext: warm and well perfused, no pedal edema, erythematous lesion overlying R lateral leg just superior to ankle Neuro: alert and oriented X3,   Lab Results: Basic Metabolic Panel:  Recent Labs Lab 10/27/14 0455  NA 137  K 4.6  CL 98  CO2 29  GLUCOSE 100*  BUN 11  CREATININE 0.77  CALCIUM 9.3    Recent Labs Lab 10/27/14 0455  LIPASE 13   CBC:  Recent Labs Lab 10/27/14 0455  WBC 6.6  HGB 10.8*  HCT 35.1*  MCV 84.2  PLT 235   Cardiac Enzymes:  Recent Labs Lab 10/27/14 0533 10/27/14 2128  TROPONINI <0.30 <0.30   Urinalysis:  Recent Labs Lab 10/27/14 0729  COLORURINE YELLOW  LABSPEC 1.013  PHURINE 5.5  GLUCOSEU NEGATIVE  HGBUR NEGATIVE  BILIRUBINUR NEGATIVE  KETONESUR NEGATIVE  PROTEINUR NEGATIVE  UROBILINOGEN 0.2  NITRITE NEGATIVE  LEUKOCYTESUR  NEGATIVE    Micro Results: Recent Results (from the past 240 hour(s))  MRSA PCR Screening     Status: None   Collection Time: 10/27/14  5:35 PM  Result Value Ref Range Status   MRSA by PCR NEGATIVE NEGATIVE Final    Comment:        The GeneXpert MRSA Assay (FDA approved for NASAL specimens only), is one component of a comprehensive MRSA colonization surveillance program. It is not intended to diagnose MRSA infection nor to guide or monitor treatment for MRSA infections.    Studies/Results: Ct Head Wo Contrast  10/27/2014   CLINICAL DATA:  Dizzy and fell about a 12/2013 this morning. Hematoma to the top of the head. Head and neck pain in the posterior occipital region.  EXAM: CT HEAD WITHOUT CONTRAST  TECHNIQUE: Contiguous axial images were obtained from the base of the skull through the vertex without intravenous contrast.  COMPARISON:  05/21/2012  FINDINGS: Diffuse cerebral atrophy. Mild low attenuation change in the deep white matter consistent with small vessel ischemia. No mass effect or midline shift. No abnormal extra-axial fluid collections. Gray-white matter junctions are distinct. Basal cisterns are not effaced. No evidence of acute intracranial hemorrhage. No depressed skull fractures. Visualized paranasal sinuses and mastoid air cells are not opacified. Vascular calcifications.  Nodular soft tissue changes along the scalp with calcification consistent with sebaceous cysts. No change since prior study.  IMPRESSION: No acute intracranial abnormalities. Chronic atrophy and small vessel ischemic changes.   Electronically Signed   By: Lucienne Capers M.D.   On: 10/27/2014 05:43   Ct Cervical Spine Wo Contrast  10/27/2014   CLINICAL DATA:  73 year old female with dizziness resulting in fall earlier this morning. Patient complains of head and neck pain. Scalp hematoma.  EXAM: CT CERVICAL SPINE WITHOUT CONTRAST  TECHNIQUE: Multidetector CT imaging of the cervical spine was performed  without intravenous contrast. Multiplanar CT image reconstructions were also generated.  COMPARISON:  Concurrently obtained CT scan of the head earlier today  FINDINGS: No acute fracture, malalignment or prevertebral soft tissue swelling. Unremarkable CT appearance of the thyroid gland. No acute soft tissue abnormality. The lung apices are unremarkable. Incompletely imaged posterior stabilization hardware beginning at T3. Multilevel cervical spondylosis including multilevel degenerative disc disease and uncovertebral joint hypertrophy without focality. Additionally, there is bilateral left slightly worse than right facet arthropathy across multiple levels. Degenerative changes also noted about the atlantodental interval. Normal bony mineralization. No lytic or blastic osseous lesion.  IMPRESSION: 1. No evidence of acute fracture or malalignment. 2. Multilevel cervical spondylosis and facet arthropathy without focality. 3. Atherosclerotic vascular calcifications in both carotid arteries.   Electronically Signed   By: Jacqulynn Cadet M.D.   On: 10/27/2014 07:12   Medications: I have reviewed the patient's current medications. Scheduled Meds: . aspirin EC  81 mg Oral Daily  . enoxaparin (LOVENOX) injection  40 mg Subcutaneous Q24H  . fentaNYL  25 mcg Transdermal Q48H  . FLUoxetine  40 mg Oral Daily  . iron polysaccharides  150 mg Oral Daily  . meclizine  25 mg Oral TID  . pantoprazole  40 mg Oral Daily  . rosuvastatin  20 mg Oral QODAY  . sodium chloride  3 mL Intravenous Q12H   Continuous Infusions: . sodium chloride 75 mL/hr at 10/28/14 1147   PRN Meds:.albuterol Assessment/Plan:  Ms. Shedrick is a 73 year old female with HTN, CAD, HLD, spinal stenosis s/p spinal fusion with rods (Feb 2015) who presents with a fall 2/2 vertigo likely from polypharmacy.  Vertigo: Likely polypharmacy as her symptoms improved with the discontinuation of Valium. Fentanyl was also dosed down to 3mcg/hr with a reduced  frequency (q72h).  -Continue meclizine 25mg  TID  -Ask her to follow-up with PCP  Anemia: Continue home Niferix.   HLD: Continue Crestor.  CAD: Continue ASA 81mg .  Depression: Continue Prozac.  #FEN:  -Diet: Heart Healthy -NS @ 75cc/hr  #DVT prophylaxis: Lovenox  #CODE STATUS: FULL CODE  Dispo: Today  The patient does have a current PCP [name], MD) and does not need an [facility] hospital follow-up appointment after discharge.  The patient does have transportation limitations that hinder transportation to clinic appointments.  .Services Needed at time of discharge: Y = Yes, Blank = No PT:   OT:   RN:   Equipment:   Other:     LOS: 1 day   Charlott Rakes, MD 10/28/2014, 3:54 PM

## 2014-10-31 ENCOUNTER — Telehealth: Payer: Self-pay | Admitting: *Deleted

## 2014-10-31 ENCOUNTER — Encounter: Payer: Medicare Other | Admitting: Nurse Practitioner

## 2014-10-31 MED ORDER — LORAZEPAM 0.5 MG PO TABS
0.5000 mg | ORAL_TABLET | Freq: Two times a day (BID) | ORAL | Status: DC | PRN
Start: 2014-10-31 — End: 2019-03-08

## 2014-10-31 MED ORDER — FENTANYL 50 MCG/HR TD PT72: 50.0000 ug | MEDICATED_PATCH | TRANSDERMAL | Status: DC

## 2014-10-31 NOTE — Telephone Encounter (Signed)
[name] [name] from [facility] called in reference to patients pain. They are unable to do Physical Therapy because of her pain. She was on fentanyl patch 75 mcg, hospital changed it to 25 mcg this past weekend. (See hosp notes). Daughter is requesting to go back up to atleast 50 mcg? Daughter thinks she is in withdrawals. They have also requested ativan.

## 2014-10-31 NOTE — Telephone Encounter (Signed)
[name] and Suanne Marker both think she needs these meds

## 2014-10-31 NOTE — Telephone Encounter (Signed)
Please discuss with patient's daughter and daughter-in-law before we address these 2 medications

## 2014-10-31 NOTE — Telephone Encounter (Signed)
Pt aware of change and aware to pick up the rx's in the am

## 2014-10-31 NOTE — Telephone Encounter (Signed)
Please refill of medication as requested with the 50 micrograms patch and the lorazepam.

## 2014-11-02 ENCOUNTER — Telehealth: Payer: Self-pay | Admitting: Hematology and Oncology

## 2014-11-02 NOTE — Telephone Encounter (Signed)
LEFT MESSAGE FOR PATIENT TO RETURN CALL TO SCHEDULE NP APPT.  °

## 2014-11-08 ENCOUNTER — Ambulatory Visit: Payer: Medicare Other

## 2014-11-08 ENCOUNTER — Ambulatory Visit (HOSPITAL_BASED_OUTPATIENT_CLINIC_OR_DEPARTMENT_OTHER): Payer: Medicare Other

## 2014-11-08 ENCOUNTER — Encounter: Payer: Self-pay | Admitting: Hematology

## 2014-11-08 ENCOUNTER — Ambulatory Visit (HOSPITAL_BASED_OUTPATIENT_CLINIC_OR_DEPARTMENT_OTHER): Payer: Medicare Other | Admitting: Hematology

## 2014-11-08 ENCOUNTER — Telehealth: Payer: Self-pay | Admitting: Hematology

## 2014-11-08 VITALS — BP 124/62 | HR 80 | Temp 98.9°F | Resp 18 | Ht 66.0 in | Wt 184.6 lb

## 2014-11-08 DIAGNOSIS — D649 Anemia, unspecified: Secondary | ICD-10-CM

## 2014-11-08 LAB — CBC & DIFF AND RETIC
BASO%: 0.5 % (ref 0.0–2.0)
BASOS ABS: 0 10*3/uL (ref 0.0–0.1)
EOS ABS: 0.3 10*3/uL (ref 0.0–0.5)
EOS%: 5.2 % (ref 0.0–7.0)
HCT: 38 % (ref 34.8–46.6)
HEMOGLOBIN: 11.8 g/dL (ref 11.6–15.9)
Immature Retic Fract: 3.4 % (ref 1.60–10.00)
LYMPH%: 48.1 % (ref 14.0–49.7)
MCH: 26.5 pg (ref 25.1–34.0)
MCHC: 31.1 g/dL — ABNORMAL LOW (ref 31.5–36.0)
MCV: 85.4 fL (ref 79.5–101.0)
MONO#: 0.4 10*3/uL (ref 0.1–0.9)
MONO%: 6.4 % (ref 0.0–14.0)
NEUT#: 2.6 10*3/uL (ref 1.5–6.5)
NEUT%: 39.8 % (ref 38.4–76.8)
Platelets: 247 10*3/uL (ref 145–400)
RBC: 4.45 10*6/uL (ref 3.70–5.45)
RDW: 16.2 % — ABNORMAL HIGH (ref 11.2–14.5)
RETIC CT ABS: 46.28 10*3/uL (ref 33.70–90.70)
Retic %: 1.04 % (ref 0.70–2.10)
WBC: 6.6 10*3/uL (ref 3.9–10.3)
lymph#: 3.2 10*3/uL (ref 0.9–3.3)

## 2014-11-08 LAB — PROTHROMBIN TIME
INR: 1.01 (ref <1.50)
Prothrombin Time: 13.4 seconds (ref 11.6–15.2)

## 2014-11-08 LAB — COMPREHENSIVE METABOLIC PANEL (CC13)
ALK PHOS: 104 U/L (ref 40–150)
ALT: 12 U/L (ref 0–55)
AST: 20 U/L (ref 5–34)
Albumin: 3.7 g/dL (ref 3.5–5.0)
Anion Gap: 7 mEq/L (ref 3–11)
BUN: 10.9 mg/dL (ref 7.0–26.0)
CO2: 29 meq/L (ref 22–29)
Calcium: 9.5 mg/dL (ref 8.4–10.4)
Chloride: 102 mEq/L (ref 98–109)
Creatinine: 0.7 mg/dL (ref 0.6–1.1)
GLUCOSE: 94 mg/dL (ref 70–140)
POTASSIUM: 4.3 meq/L (ref 3.5–5.1)
Sodium: 139 mEq/L (ref 136–145)
Total Bilirubin: 0.26 mg/dL (ref 0.20–1.20)
Total Protein: 6.7 g/dL (ref 6.4–8.3)

## 2014-11-08 LAB — MORPHOLOGY: PLT EST: ADEQUATE

## 2014-11-08 LAB — LACTATE DEHYDROGENASE (CC13): LDH: 189 U/L (ref 125–245)

## 2014-11-08 LAB — APTT: APTT: 28.8 s (ref 24–37)

## 2014-11-08 NOTE — Telephone Encounter (Signed)
Gave avs & cal for Feb 2016. °

## 2014-11-08 NOTE — Progress Notes (Signed)
Checked in new pt with no financial concerns. °

## 2014-11-08 NOTE — Progress Notes (Signed)
[facility] CONSULT NOTE  Patient Care Team: [name], MD as PCP - General Cindee Salt, MD (Physical Medicine and Rehabilitation) Hillary Bow, MD (Cardiology) Melina Schools, MD (Obstetrics and Gynecology)  CHIEF COMPLAINTS/PURPOSE OF CONSULTATION:  Anemia   HISTORY OF PRESENTING ILLNESS:  [name] [name] 73 y.o. female is being referred by Dr. Deatra Ina because of anemia.   She had lumbar and thoracic spine fusion surgery for kyphosis in Feb 2015 and Sep 2015, and required blood transfusion 2u each time after surgery. She was discharged to rehab after surgery, and she received 2u RBC in Nov 4th, 2015 for Hb 7.8g/dl. She was fatigued when her Hg was low, but still able to do some rehab. She had multiple full in the past few month and was hospitalized last month. She still has diffuse body pain from the recent fall, quite fatigued, is only able to walk a short distance, (+) dyspnea and chest heaviness on exertion. No nausea, no change of her bowel habits. Her appetite is moderate to low, she lost about 15-20lbs in the past year.  She denied any bleeding episodes including hematochezia, melana, hemoptysis, hematuria or epitaxis. No mucosal bleeding or easy bruising. She was finally discharged home on 10/30/14, still getting home PT/OT. She came in today with a wheelchair.   Per her daughter, she has had intermittent confusion and anxiety lately, she seems to have some cognitive function dificiency also. Pt her self contributes to some of her medications.   She has last colonoscopy about 3 years ago, she probably had polyps removed. Her last mammogram was one year ago which was normal. Per her daughter, she has a normal CT abdomen and pelvis about 5-6 month ago which was ordered for some abdominal symptoms. She has been on iron pill (1 daily) a few weeks ago.     MEDICAL HISTORY:  Past Medical History  Diagnosis Date  . Obesity   . Atrial fibrillation    . CTS  (carpal tunnel syndrome)   . Diarrhea   . IBS (irritable bowel syndrome)   . Diverticulitis of colon   . Gastritis   . Esophageal stricture, s/p dilation    . Personal history of colonic polyps 10/25/2011 & 12/02/11    not retrieved Dr Lyla Son & tubular adenomas  . Hiatal hernia   . GERD (gastroesophageal reflux disease)   . Obstructive sleep apnea   . Insomnia   . HTN (hypertension)   . Hyperlipidemia   . Depression/anxiety    . DIASTOLIC HEART FAILURE, CHRONIC 04/04/2009    Qualifier: Diagnosis of  By: Mare Ferrari, RMA, Sherri      SURGICAL HISTORY: Past Surgical History  Procedure Laterality Date  . Carpal tunnel release    . Cholecystectomy  2003  . Total knee arthroplasty      bilateral    SOCIAL HISTORY: History   Social History  . Marital Status: Widowed    Spouse Name: N/A    Number of Children: 2  . Years of Education: N/A   Occupational History  . librarian- retired     retired   Social History Main Topics  . Smoking status: Never Smoker   . Smokeless tobacco: Never Used  . Alcohol Use: Yes     Comment: very rare  . Drug Use: No  . Sexual Activity: Not on file   Other Topics Concern  . Not on file   Social History Narrative    FAMILY HISTORY:  Family History  Problem Relation Age of Onset  . Coronary artery disease Father   . Peripheral vascular disease Father   . Coronary artery disease Mother   . Coronary artery disease Brother   . Colon cancer Sister at 68   . Emphysema Sister   . Sleep apnea Son   . Colon cancer Sister at 41     spread to her brain  No family history of blood disorders or other malignancy   ALLERGIES:  is allergic to morphine and related; altace; cymbalta; duloxetine; floxin; hydromorphone; lipitor; lovaza; oxycodone-acetaminophen; percocet; pravachol; trilipix; valium; zetia; and zocor.  MEDICATIONS:  Current Outpatient Prescriptions  Medication Sig Dispense Refill  . amLODipine (NORVASC) 2.5 MG tablet Take 1  tablet by mouth  daily. For HTN    . aspirin EC 81 MG tablet Take 81 mg by mouth daily. For prophylasxis    . Calcium Carbonate-Vitamin D (CALCIUM-VITAMIN D) 500-200 MG-UNIT per tablet Take 1 tablet by mouth daily. 1213m-1000mg    . colesevelam (WELCHOL) 625 MG tablet Take 625 mg by mouth daily with breakfast.    . diphenoxylate-atropine (LOMOTIL) 2.5-0.025 MG per tablet Take 1 tablet by mouth 4 (four) times daily as needed for diarrhea or loose stools.     .Marland Kitchenesomeprazole (NEXIUM) 40 MG capsule Take 1 capsule (40 mg total) by mouth 2 (two) times daily before a meal. For GERD 180 capsule 1  . Eszopiclone 3 MG TABS Take 1 tablet (3 mg total) by mouth at bedtime. For insomnia,take immediately before bedtime. 90 tablet 1  . fentaNYL (DURAGESIC - DOSED MCG/HR) 50 MCG/HR Place 1 patch (50 mcg total) onto the skin every 3 (three) days. 10 patch 0  . FLUoxetine (PROZAC) 40 MG capsule Take 1 capsule (40 mg total) by mouth daily. For depression 90 capsule 1  . furosemide (LASIX) 20 MG tablet Take 1 tablet (20 mg total) by mouth daily.    .Marland KitchenHYDROcodone-acetaminophen (NORCO) 10-325 MG per tablet Take 1 tablet by mouth every 4 (four) hours as needed.    . iron polysaccharides (NIFEREX) 150 MG capsule Take 150 mg by mouth daily.    .Marland KitchenLORazepam (ATIVAN) 0.5 MG tablet Take 1 tablet (0.5 mg total) by mouth 2 (two) times daily as needed for anxiety. 60 tablet 1  . meclizine (ANTIVERT) 25 MG tablet Take 1 tablet (25 mg total) by mouth 3 (three) times daily. 30 tablet 0  . methocarbamol (ROBAXIN) 500 MG tablet Take 500 mg by mouth every 6 (six) hours as needed for muscle spasms.     . Multiple Vitamin (MULTIVITAMIN) tablet Take 1 tablet by mouth daily. For supplement    . rosuvastatin (CRESTOR) 20 MG tablet Take 1 tablet (20 mg total) by mouth daily. (Patient taking differently: Take 20 mg by mouth daily. 137malternating with 207maily)     No current facility-administered medications for this visit.    REVIEW  OF SYSTEMS:   Constitutional: Denies fevers, chills or abnormal night sweats Eyes: Denies blurriness of vision, double vision or watery eyes Ears, nose, mouth, throat, and face: Denies mucositis or sore throat Respiratory: Denies cough, dyspnea or wheezes Cardiovascular: Denies palpitation, chest discomfort or lower extremity swelling Gastrointestinal:  Denies nausea, heartburn or change in bowel habits Skin: Denies abnormal skin rashes Lymphatics: Denies new lymphadenopathy or easy bruising Neurological:Denies numbness, tingling or new weaknesses Behavioral/Psych: Mood is stable, no new changes  All other systems were reviewed with the patient and are negative.  PHYSICAL EXAMINATION:  Filed Vitals:   11/08/14 1343  BP: 124/62  Pulse: 80  Temp: 98.9 F (37.2 C)  Resp: 18   Filed Weights   11/08/14 1343  Weight: 184 lb 9.6 oz (83.734 kg)    GENERAL:alert, no distress and comfortable, sitting in wheelchair  SKIN: skin color, texture, turgor are normal, no rashes or significant lesions EYES: normal, conjunctiva are pink and non-injected, sclera clear OROPHARYNX:no exudate, no erythema and lips, buccal mucosa, and tongue normal  NECK: supple, thyroid normal size, non-tender, without nodularity LYMPH:  no palpable lymphadenopathy in the cervical, axillary or inguinal LUNGS: clear to auscultation and percussion with normal breathing effort HEART: regular rate & rhythm and no murmurs and no lower extremity edema ABDOMEN:abdomen soft, non-tender and normal bowel sounds Musculoskeletal:no cyanosis of digits and no clubbing. (+) surgical scar at her thoracic and lumbar spine  PSYCH: alert & oriented x 3 with fluent speech, but argues with her daughter a lot  NEURO: no focal motor/sensory deficits  LABORATORY DATA:  I have reviewed the data as listed Lab Results  Component Value Date   WBC 6.6 10/27/2014   HGB 10.8* 10/27/2014   HCT 35.1* 10/27/2014   MCV 84.2 10/27/2014   PLT  235 10/27/2014    Recent Labs  04/24/14 1205 05/23/14 1438 08/21/14 1141 08/28/14 1604 10/27/14 0455  NA 138 140 141  --  137  K 4.5 4.6 4.8  --  4.6  CL 97 98 98  --  98  CO2 25 31* 29  --  29  GLUCOSE 95 89 103*  --  100*  BUN 14 13 12   --  11  CREATININE 0.80 0.77 0.84  --  0.77  CALCIUM 9.3 9.5 9.5  --  9.3  GFRNONAA 73 76 69  --  80*  GFRAA 84 88 79  --  >90  PROT 6.2  --  6.2 5.9*  --   AST 46*  --  27 22  --   ALT 20  --  42* 21  --   ALKPHOS 107  --  278* 176*  --   BILITOT 0.3  --  0.4 0.2  --   BILIDIR 0.09  --  0.16 0.07  --    Her outside lab on 10/22/2014 showed serum iron of 16, TIBC 254, saturation 6.3%, ferritin was not checked.  RADIOGRAPHIC STUDIES: I have personally reviewed the radiological images as listed and agreed with the findings in the report. Ct Head Wo Contrast 10/27/2014   IMPRESSION: No acute intracranial abnormalities. Chronic atrophy and small vessel ischemic changes.     Ct Cervical Spine Wo Contrast 10/27/2014 IMPRESSION:  1. No evidence of acute fracture or malalignment.  2. Multilevel cervical spondylosis and facet arthropathy without focality.  3. Atherosclerotic vascular calcifications in both carotid arteries.      ASSESSMENT & PLAN:  Ms Wattenbarger is a 73 year old female with multiple comorbidities, who presents with moderate persistent anemia after her spinal fusion surgery which required blood transfusion 3 times.  1. Normocytic anemia, iron deficient anemia versus anemia of chronic disease. -Her outside lab reviewed low serum iron level, but her ferritin level is not available at this point. Her TIBC was at the lower normal limit, and MCV is normal, which supports anemia of chronic disease. To confirm it, I will check her reticular count to see if this is a hypo-productive anemia. She does have a lot of medical comorbidities, certainly anemia of chronic disease is also a possibility  if her lab shows a hypo-productive anemia but low iron  level and normal or high ferritin level. We'll check chronic inflammation marker ESR. -To complete anemia workup, I'll also check her E83 and folic acid levels and TSH. I would like to ruled out hemolytic anemia by checking reticular count, haptoglobin LDH and Coombs test.  -If her about anemia workup supports anemia of iron deficient anemia, I would suggest to repeat colonoscopy to ruled out GI bleeding. She had last colonoscopy 3 years ago was some polyps removed.  -If her anemia persistence, anemia of bone marrow disorder, such as MDS, is also possible. The diagnosis was dependent on bone marrow biopsy. Her CBC shows normal white count and differential and normal platelet count, so my suspicion is not very high. I'll review her peripheral blood smear, and hold off bone marrow biopsy at this point. -I'll also obtain SPEP to ruled out multiple myeloma, although she does not have any other multiple myeloma signs such as abnormal renal function or hypercalcemia.  All questions were answered. The patient knows to call the clinic with any problems, questions or concerns. I spent 25 minutes counseling the patient face to face. The total time spent in the appointment was 40 minutes and more than 50% was on counseling.  I will call her daughter Raelyn Ensign at [phone] (cell) next week to go over the above test results. I plan to see her back in 3 months for follow-up. If her anemia gets worse, we may consider further investigation such as bone marrow biopsy.  Orders Placed This Encounter  Procedures  . CBC & Diff and Retic    Standing Status: Future     Number of Occurrences:      Standing Expiration Date: 11/09/2015  . Morphology    Standing Status: Future     Number of Occurrences:      Standing Expiration Date: 11/09/2015  . Comprehensive metabolic panel (Cmet) - [facility]    Standing Status: Future     Number of Occurrences:      Standing Expiration Date: 11/09/2015  . Lactate dehydrogenase (LDH) - [facility]     Standing Status: Future     Number of Occurrences:      Standing Expiration Date: 11/09/2015  . Protime-INR    Standing Status: Future     Number of Occurrences:      Standing Expiration Date: 11/09/2015  . Sedimentation rate    Standing Status: Future     Number of Occurrences:      Standing Expiration Date: 11/09/2015  . Ferritin    Standing Status: Future     Number of Occurrences:      Standing Expiration Date: 11/09/2015  . Erythropoietin    Standing Status: Future     Number of Occurrences:      Standing Expiration Date: 11/09/2015  . Iron and TIBC [facility]    Standing Status: Future     Number of Occurrences:      Standing Expiration Date: 11/09/2015  . Folate RBC    Standing Status: Future     Number of Occurrences:      Standing Expiration Date: 11/09/2015  . Vitamin B12    Standing Status: Future     Number of Occurrences:      Standing Expiration Date: 11/09/2015  . Haptoglobin    Standing Status: Future     Number of Occurrences:      Standing Expiration Date: 11/09/2015  . Transferrin Receptor, Soluble    Standing  Status: Future     Number of Occurrences:      Standing Expiration Date: 11/09/2015  . SPEP with reflex to IFE    Standing Status: Future     Number of Occurrences:      Standing Expiration Date: 11/09/2015  . APTT    Standing Status: Future     Number of Occurrences:      Standing Expiration Date: 11/09/2015  . Direct antiglobulin test ([facility])    Standing Status: Future     Number of Occurrences:      Standing Expiration Date: 11/09/2015       Truitt Merle, MD 11/08/2014 3:22 PM

## 2014-11-09 LAB — IRON AND TIBC CHCC
%SAT: 13 % — ABNORMAL LOW (ref 21–57)
Iron: 44 ug/dL (ref 41–142)
TIBC: 324 ug/dL (ref 236–444)
UIBC: 280 ug/dL (ref 120–384)

## 2014-11-09 LAB — DIRECT ANTIGLOBULIN TEST (NOT AT ARMC)
DAT (Complement): NEGATIVE
DAT IgG: NEGATIVE

## 2014-11-09 LAB — FERRITIN CHCC: Ferritin: 107 ng/ml (ref 9–269)

## 2014-11-10 ENCOUNTER — Other Ambulatory Visit: Payer: Self-pay | Admitting: Hematology

## 2014-11-10 DIAGNOSIS — D539 Nutritional anemia, unspecified: Secondary | ICD-10-CM

## 2014-11-10 HISTORY — DX: Nutritional anemia, unspecified: D53.9

## 2014-11-12 ENCOUNTER — Encounter: Payer: Self-pay | Admitting: Family Medicine

## 2014-11-12 ENCOUNTER — Ambulatory Visit (INDEPENDENT_AMBULATORY_CARE_PROVIDER_SITE_OTHER): Payer: Medicare Other | Admitting: Family Medicine

## 2014-11-12 VITALS — BP 124/71 | HR 77 | Temp 98.8°F | Ht 66.0 in | Wt 183.0 lb

## 2014-11-12 DIAGNOSIS — S0990XA Unspecified injury of head, initial encounter: Secondary | ICD-10-CM

## 2014-11-12 DIAGNOSIS — R519 Headache, unspecified: Secondary | ICD-10-CM

## 2014-11-12 DIAGNOSIS — R42 Dizziness and giddiness: Secondary | ICD-10-CM

## 2014-11-12 DIAGNOSIS — W19XXXA Unspecified fall, initial encounter: Secondary | ICD-10-CM

## 2014-11-12 DIAGNOSIS — R410 Disorientation, unspecified: Secondary | ICD-10-CM

## 2014-11-12 DIAGNOSIS — F068 Other specified mental disorders due to known physiological condition: Secondary | ICD-10-CM

## 2014-11-12 DIAGNOSIS — M47812 Spondylosis without myelopathy or radiculopathy, cervical region: Secondary | ICD-10-CM

## 2014-11-12 DIAGNOSIS — Z09 Encounter for follow-up examination after completed treatment for conditions other than malignant neoplasm: Secondary | ICD-10-CM

## 2014-11-12 DIAGNOSIS — R51 Headache: Secondary | ICD-10-CM

## 2014-11-12 LAB — POCT CBC
GRANULOCYTE PERCENT: 57.4 % (ref 37–80)
HCT, POC: 38.9 % (ref 37.7–47.9)
Hemoglobin: 12 g/dL — AB (ref 12.2–16.2)
Lymph, poc: 2.4 (ref 0.6–3.4)
MCH: 25.3 pg — AB (ref 27–31.2)
MCHC: 30.8 g/dL — AB (ref 31.8–35.4)
MCV: 82.3 fL (ref 80–97)
MPV: 8.4 fL (ref 0–99.8)
PLATELET COUNT, POC: 246 10*3/uL (ref 142–424)
POC Granulocyte: 3.8 (ref 2–6.9)
POC LYMPH PERCENT: 37 %L (ref 10–50)
RBC: 4.7 M/uL (ref 4.04–5.48)
RDW, POC: 18.3 %
WBC: 6.6 10*3/uL (ref 4.6–10.2)

## 2014-11-12 LAB — HAPTOGLOBIN: Haptoglobin: 197 mg/dL (ref 45–215)

## 2014-11-12 LAB — VITAMIN B12: VITAMIN B 12: 861 pg/mL (ref 211–911)

## 2014-11-12 LAB — ERYTHROPOIETIN: ERYTHROPOIETIN: 13.5 m[IU]/mL (ref 2.6–18.5)

## 2014-11-12 LAB — FOLATE RBC: RBC Folate: 1296 ng/mL (ref >280)

## 2014-11-12 LAB — SEDIMENTATION RATE: SED RATE: 11 mm/h (ref 0–22)

## 2014-11-12 NOTE — Patient Instructions (Signed)
Please continue with physical therapy We will arrange for you to see the neurologist regarding the episodes of confusion and lack of understanding issues that you have since you hit your head. Try to be more positive and accept your family's willingness to help you recuperate Moves slowly and continue to be careful and not fall Use warm wet compresses to your posterior neck

## 2014-11-12 NOTE — Progress Notes (Signed)
Subjective:    Patient ID: [name] [name], female    DOB: February 07, 1941, 73 y.o.   MRN: [id number]  HPI Patient here today for hospital follow up. She has had a second back surgery on 09/12/14, been in Rehab, had 2 falls and has had recent low HGB. The patient had a CT of the cervical spine and this showed no evidence of acute fracture even though she does have multilevel cervical spondylosis. A CT of her head showed no acute intracranial abnormalities with chronic atrophy and small vessel ischemic changes. The daughter who comes with her today says that since this fall the patient seems to have more episodes of confusion and continued headache.          Patient Active Problem List   Diagnosis Date Noted  . Deficiency anemia 11/10/2014  . Vertigo 10/27/2014  . Metabolic syndrome 97/14/1067  . S/P spinal fusion 02/23/2014  . Acute blood loss anemia 02/23/2014  . Chronic pain associated with significant psychosocial dysfunction 02/23/2014  . Preoperative evaluation of a medical condition to rule out surgical contraindications (TAR required) 10/09/2013  . History of IBS 01/26/2012  . 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 EDEMA 06/25/2010  . DEGENERATIVE JOINT DISEASE 04/08/2010  . OTHER DISORDERS OF NERVOUS SYSTEM&SENSE ORGANS 04/08/2010  . HYPERCHOLESTEROLEMIA  IIA 04/08/2009  . Obesity (BMI 30.0-34.9) 04/04/2009  . CIRCADIAN RHYTHM SLEEP D/O DELAY SLEEP PHSE TYPE 04/04/2009  . CARPAL TUNNEL SYNDROME 04/04/2009  . CAD, UNSPECIFIED SITE 04/04/2009  . DIASTOLIC HEART FAILURE, CHRONIC 04/04/2009  . DIARRHEA 01/17/2009  . IRRITABLE BOWEL SYNDROME 11/13/2008  . ESOPHAGEAL STRICTURE 11/12/2008  . GERD 11/12/2008  . DUODENITIS, WITHOUT HEMORRHAGE 11/12/2008  . Diaphragmatic Hernia without Mention of Obstruction or Gangrene 11/12/2008  . DIVERTICULOSIS, COLON 11/12/2008  . COLONIC POLYPS, HX OF 11/12/2008  . Obstructive sleep apnea 05/23/2008  . DYSPNEA 05/23/2008  .  HYPERLIPIDEMIA 05/22/2008  . Depression 05/22/2008  . Essential hypertension 05/22/2008  . INSOMNIA 05/22/2008   Outpatient Encounter Prescriptions as of 11/12/2014  Medication Sig  . amLODipine (NORVASC) 2.5 MG tablet Take 1 tablet by mouth  daily. For HTN  . aspirin EC 81 MG tablet Take 81 mg by mouth daily. For prophylasxis  . Calcium Carbonate-Vitamin D (CALCIUM-VITAMIN D) 500-200 MG-UNIT per tablet Take 1 tablet by mouth daily. 1220m-1000mg  . colesevelam (WELCHOL) 625 MG tablet Take 625 mg by mouth daily with breakfast.  . diphenoxylate-atropine (LOMOTIL) 2.5-0.025 MG per tablet Take 1 tablet by mouth 4 (four) times daily as needed for diarrhea or loose stools.   .Marland Kitchenesomeprazole (NEXIUM) 40 MG capsule Take 1 capsule (40 mg total) by mouth 2 (two) times daily before a meal. For GERD  . Eszopiclone 3 MG TABS Take 1 tablet (3 mg total) by mouth at bedtime. For insomnia,take immediately before bedtime.  . fentaNYL (DURAGESIC - DOSED MCG/HR) 50 MCG/HR Place 1 patch (50 mcg total) onto the skin every 3 (three) days.  .Marland KitchenFLUoxetine (PROZAC) 40 MG capsule Take 1 capsule (40 mg total) by mouth daily. For depression  . furosemide (LASIX) 20 MG tablet Take 1 tablet (20 mg total) by mouth daily.  .Marland KitchenHYDROcodone-acetaminophen (NORCO) 10-325 MG per tablet Take 1 tablet by mouth every 4 (four) hours as needed.  . iron polysaccharides (NIFEREX) 150 MG capsule Take 150 mg by mouth daily.  .Marland KitchenLORazepam (ATIVAN) 0.5 MG tablet Take 1 tablet (0.5 mg total) by mouth 2 (two) times daily as needed for anxiety.  . meclizine (ANTIVERT) 25  MG tablet Take 1 tablet (25 mg total) by mouth 3 (three) times daily.  . methocarbamol (ROBAXIN) 500 MG tablet Take 500 mg by mouth every 6 (six) hours as needed for muscle spasms.   . Multiple Vitamin (MULTIVITAMIN) tablet Take 1 tablet by mouth daily. For supplement  . rosuvastatin (CRESTOR) 20 MG tablet Take 1 tablet (20 mg total) by mouth daily. (Patient taking differently:  Take 20 mg by mouth daily. 49m alternating with 238mdaily)    Review of Systems  Constitutional: Positive for fatigue.  Eyes: Negative.   Respiratory: Negative.   Cardiovascular: Negative.   Gastrointestinal: Negative.   Endocrine: Negative.   Genitourinary: Negative.   Musculoskeletal: Positive for back pain. Neck stiffness: since fall.  Skin: Negative.   Allergic/Immunologic: Negative.   Neurological: Positive for dizziness (doing some better), weakness and headaches.  Hematological: Negative.   Psychiatric/Behavioral: Positive for confusion. The patient is nervous/anxious.        Objective:   Physical Exam  Constitutional: She is oriented to person, place, and time. She appears well-developed and well-nourished. She appears distressed.  Somewhat depressed affect with tearfulness and crying  HENT:  Head: Normocephalic and atraumatic.  Eyes: Conjunctivae and EOM are normal. Pupils are equal, round, and reactive to light. Right eye exhibits no discharge. Left eye exhibits no discharge.  Neck: Neck supple. No thyromegaly present.  Range of motion of neck is somewhat limited due to pain.  Cardiovascular: Normal rate, regular rhythm and normal heart sounds.   No murmur heard. The rhythm is regular at 72/m  Pulmonary/Chest: Effort normal and breath sounds normal. She has no wheezes. She has no rales.  Abdominal: Soft. She exhibits no distension. There is no tenderness.  Musculoskeletal: She exhibits no edema.  Patient moves slowly and cautiously and has a rolling walker.  Neurological: She is alert and oriented to person, place, and time.  Skin: Skin is warm and dry. No rash noted.  Psychiatric: Her behavior is normal. Judgment and thought content normal.  Depressed and anxious  Nursing note and vitals reviewed.  BP 124/71 mmHg  Pulse 77  Temp(Src) 98.8 F (37.1 C) (Oral)  Ht 5' 6" (1.676 m)  Wt 183 lb (83.008 kg)  BMI 29.55 kg/m2        Assessment & Plan:  1.  Hospital discharge follow-up - POCT CBC - BMP8+EGFR  2. Fall, initial encounter - Ambulatory referral to Neurology  3. Dizziness - Ambulatory referral to Neurology  4. Headache, unspecified headache type - Ambulatory referral to Neurology  5. Confusion, postoperative - Ambulatory referral to Neurology  6. Cervical spondylosis without myelopathy  7. Head injury, initial encounter  Patient Instructions  Please continue with physical therapy We will arrange for you to see the neurologist regarding the episodes of confusion and lack of understanding issues that you have since you hit your head. Try to be more positive and accept your family's willingness to help you recuperate Moves slowly and continue to be careful and not fall Use warm wet compresses to your posterior neck   DoArrie SenateD

## 2014-11-13 ENCOUNTER — Ambulatory Visit: Payer: Medicare Other | Admitting: Hematology and Oncology

## 2014-11-13 ENCOUNTER — Ambulatory Visit: Payer: Medicare Other

## 2014-11-13 ENCOUNTER — Other Ambulatory Visit: Payer: Self-pay | Admitting: Hematology

## 2014-11-13 LAB — BMP8+EGFR
BUN / CREAT RATIO: 15 (ref 11–26)
BUN: 12 mg/dL (ref 8–27)
CO2: 27 mmol/L (ref 18–29)
CREATININE: 0.79 mg/dL (ref 0.57–1.00)
Calcium: 9.6 mg/dL (ref 8.7–10.3)
Chloride: 98 mmol/L (ref 97–108)
GFR calc Af Amer: 85 mL/min/{1.73_m2} (ref >59)
GFR calc non Af Amer: 73 mL/min/{1.73_m2} (ref >59)
Glucose: 102 mg/dL — ABNORMAL HIGH (ref 65–99)
Potassium: 4.7 mmol/L (ref 3.5–5.2)
Sodium: 140 mmol/L (ref 134–144)

## 2014-11-13 LAB — PROTEIN ELECTROPHORESIS, SERUM
ALPHA-2-GLOBULIN: 13.5 % — AB (ref 7.1–11.8)
Albumin ELP: 54.8 % — ABNORMAL LOW (ref 55.8–66.1)
Alpha-1-Globulin: 5.3 % — ABNORMAL HIGH (ref 2.9–4.9)
Beta 2: 5.2 % (ref 3.2–6.5)
Beta Globulin: 6.5 % (ref 4.7–7.2)
GAMMA GLOBULIN: 14.7 % (ref 11.1–18.8)
M-Spike, %: 0.26 g/dL
TOTAL PROTEIN, SERUM ELECTROPHOR: 6.6 g/dL (ref 6.0–8.3)

## 2014-11-13 LAB — SOLUBLE TRANSFERRIN RECEPTOR: Transferrin Receptor, Soluble: 2.57 mg/L — ABNORMAL HIGH (ref 0.76–1.76)

## 2014-11-13 MED ORDER — FLUOXETINE HCL 40 MG PO CAPS: 40.0000 mg | ORAL_CAPSULE | Freq: Every day | ORAL | Status: DC

## 2014-11-13 MED ORDER — POLYSACCHARIDE IRON COMPLEX 150 MG PO CAPS: 150.0000 mg | ORAL_CAPSULE | Freq: Every day | ORAL | Status: DC

## 2014-11-13 MED ORDER — ESOMEPRAZOLE MAGNESIUM 40 MG PO CPDR: 40.0000 mg | DELAYED_RELEASE_CAPSULE | Freq: Two times a day (BID) | ORAL | Status: DC

## 2014-11-13 MED ORDER — AMLODIPINE BESYLATE 2.5 MG PO TABS
ORAL_TABLET | ORAL | Status: DC
Start: 2014-11-13 — End: 2015-10-05

## 2014-11-13 MED ORDER — FUROSEMIDE 20 MG PO TABS: 20.0000 mg | ORAL_TABLET | Freq: Every day | ORAL | Status: DC

## 2014-11-13 MED ORDER — ROSUVASTATIN CALCIUM 20 MG PO TABS: 20.0000 mg | ORAL_TABLET | Freq: Every day | ORAL | Status: DC

## 2014-11-13 MED ORDER — ESZOPICLONE 3 MG PO TABS
3.0000 mg | ORAL_TABLET | Freq: Every day | ORAL | Status: DC
Start: 2014-11-13 — End: 2015-04-22

## 2014-11-13 NOTE — Addendum Note (Signed)
Addended by: Zannie Cove on: 11/13/2014 03:42 PM   Modules accepted: Orders

## 2014-11-14 ENCOUNTER — Telehealth: Payer: Self-pay | Admitting: Hematology

## 2014-11-14 NOTE — Telephone Encounter (Signed)
S/w daughter [name] confirming MD visit per 11/24 POF..... KJ

## 2014-11-20 HISTORY — PX: BONE MARROW BIOPSY: SHX1253

## 2014-11-26 ENCOUNTER — Encounter: Payer: Self-pay | Admitting: Hematology

## 2014-11-26 ENCOUNTER — Telehealth: Payer: Self-pay | Admitting: Hematology

## 2014-11-26 ENCOUNTER — Ambulatory Visit (HOSPITAL_BASED_OUTPATIENT_CLINIC_OR_DEPARTMENT_OTHER): Payer: Medicare Other | Admitting: Hematology

## 2014-11-26 VITALS — BP 114/48 | HR 69 | Temp 99.2°F | Resp 17 | Ht 66.0 in | Wt 186.7 lb

## 2014-11-26 DIAGNOSIS — D472 Monoclonal gammopathy: Secondary | ICD-10-CM

## 2014-11-26 DIAGNOSIS — D649 Anemia, unspecified: Secondary | ICD-10-CM

## 2014-11-26 DIAGNOSIS — E785 Hyperlipidemia, unspecified: Secondary | ICD-10-CM

## 2014-11-26 DIAGNOSIS — R410 Disorientation, unspecified: Secondary | ICD-10-CM

## 2014-11-26 DIAGNOSIS — I4891 Unspecified atrial fibrillation: Secondary | ICD-10-CM

## 2014-11-26 DIAGNOSIS — I10 Essential (primary) hypertension: Secondary | ICD-10-CM

## 2014-11-26 DIAGNOSIS — F419 Anxiety disorder, unspecified: Secondary | ICD-10-CM

## 2014-11-26 NOTE — Progress Notes (Signed)
[facility] CONSULT NOTE  Patient Care Team: [name], MD as PCP - General Cindee Salt, MD (Physical Medicine and Rehabilitation) Hillary Bow, MD (Cardiology) Melina Schools, MD (Obstetrics and Gynecology)  CHIEF COMPLAINTS Anemia   INITIAL PRESENTATION (first consult):  [name] [name] 73 y.o. female is being referred by Dr. Deatra Ina because of anemia.   She had lumbar and thoracic spine fusion surgery for kyphosis in Feb 2015 and Sep 2015, and required blood transfusion 2u each time after surgery. She was discharged to rehab after surgery, and she received 2u RBC in Nov 4th, 2015 for Hb 7.8g/dl. She was fatigued when her Hg was low, but still able to do some rehab. She had multiple full in the past few month and was hospitalized last month. She still has diffuse body pain from the recent fall, quite fatigued, is only able to walk a short distance, (+) dyspnea and chest heaviness on exertion. No nausea, no change of her bowel habits. Her appetite is moderate to low, she lost about 15-20lbs in the past year.  She denied any bleeding episodes including hematochezia, melana, hemoptysis, hematuria or epitaxis. No mucosal bleeding or easy bruising. She was finally discharged home on 10/30/14, still getting home PT/OT. She came in today with a wheelchair.   Per her daughter, she has had intermittent confusion and anxiety lately, she seems to have some cognitive function dificiency also. Pt her self contributes to some of her medications.   She has last colonoscopy about 3 years ago, she probably had polyps removed. Her last mammogram was one year ago which was normal. Per her daughter, she has a normal CT abdomen and pelvis about 5-6 month ago which was ordered for some abdominal symptoms. She has been on iron pill (1 daily) a few weeks ago.    INTERVAL HISTORY  She returns for follow up. She has finished her home PT last week, she is able to walk with a walker. Pt and  her daughter reports intermittent confusion, and short term memory lost since the spine surgery. She has goo appetite, She was seen by her PCP Dr. Laurance Flatten a few weeks ago, and is being referred to neurology. Her back pain is much better.      MEDICAL HISTORY:  Past Medical History  Diagnosis Date  . Obesity   . Atrial fibrillation    . CTS (carpal tunnel syndrome)   . Diarrhea   . IBS (irritable bowel syndrome)   . Diverticulitis of colon   . Gastritis   . Esophageal stricture, s/p dilation    . Personal history of colonic polyps 10/25/2011 & 12/02/11    not retrieved Dr Lyla Son & tubular adenomas  . Hiatal hernia   . GERD (gastroesophageal reflux disease)   . Obstructive sleep apnea   . Insomnia   . HTN (hypertension)   . Hyperlipidemia   . Depression/anxiety    . DIASTOLIC HEART FAILURE, CHRONIC 04/04/2009    Qualifier: Diagnosis of  By: Mare Ferrari, RMA, Sherri      SURGICAL HISTORY: Past Surgical History  Procedure Laterality Date  . Carpal tunnel release    . Cholecystectomy  2003  . Total knee arthroplasty      bilateral    SOCIAL HISTORY: History   Social History  . Marital Status: Widowed    Spouse Name: N/A    Number of Children: 2  . Years of Education: N/A   Occupational History  . librarian-  retired     retired   Social History Main Topics  . Smoking status: Never Smoker   . Smokeless tobacco: Never Used  . Alcohol Use: Yes     Comment: very rare  . Drug Use: No  . Sexual Activity: Not on file   Other Topics Concern  . Not on file   Social History Narrative    FAMILY HISTORY: Family History  Problem Relation Age of Onset  . Coronary artery disease Father   . Peripheral vascular disease Father   . Coronary artery disease Mother   . Coronary artery disease Brother   . Colon cancer Sister at 26   . Emphysema Sister   . Sleep apnea Son   . Colon cancer Sister at 14     spread to her brain  No family history of blood disorders or other  malignancy   ALLERGIES:  is allergic to morphine and related; altace; cymbalta; duloxetine; floxin; hydromorphone; lipitor; lovaza; oxycodone-acetaminophen; percocet; pravachol; trilipix; valium; zetia; and zocor.  MEDICATIONS:  Current Outpatient Prescriptions  Medication Sig Dispense Refill  . amLODipine (NORVASC) 2.5 MG tablet Take 1 tablet by mouth  daily. For HTN 90 tablet 3  . aspirin EC 81 MG tablet Take 81 mg by mouth daily. For prophylasxis    . Calcium Carbonate-Vitamin D (CALCIUM-VITAMIN D) 500-200 MG-UNIT per tablet Take 1 tablet by mouth daily. 121m-1000mg    . colesevelam (WELCHOL) 625 MG tablet Take 625 mg by mouth daily with breakfast.    . diphenoxylate-atropine (LOMOTIL) 2.5-0.025 MG per tablet Take 1 tablet by mouth 4 (four) times daily as needed for diarrhea or loose stools.     .Marland Kitchenesomeprazole (NEXIUM) 40 MG capsule Take 1 capsule (40 mg total) by mouth 2 (two) times daily before a meal. For GERD 180 capsule 3  . Eszopiclone 3 MG TABS Take 1 tablet (3 mg total) by mouth at bedtime. For insomnia,take immediately before bedtime. 90 tablet 1  . fentaNYL (DURAGESIC - DOSED MCG/HR) 50 MCG/HR Place 1 patch (50 mcg total) onto the skin every 3 (three) days. 10 patch 0  . FLUoxetine (PROZAC) 40 MG capsule Take 1 capsule (40 mg total) by mouth daily. For depression 90 capsule 3  . furosemide (LASIX) 20 MG tablet Take 1 tablet (20 mg total) by mouth daily. 90 tablet 3  . HYDROcodone-acetaminophen (NORCO) 10-325 MG per tablet Take 1 tablet by mouth every 4 (four) hours as needed.    . iron polysaccharides (NIFEREX) 150 MG capsule Take 1 capsule (150 mg total) by mouth daily. 90 capsule 3  . LORazepam (ATIVAN) 0.5 MG tablet Take 1 tablet (0.5 mg total) by mouth 2 (two) times daily as needed for anxiety. 60 tablet 1  . meclizine (ANTIVERT) 25 MG tablet Take 1 tablet (25 mg total) by mouth 3 (three) times daily. 30 tablet 0  . methocarbamol (ROBAXIN) 500 MG tablet Take 500 mg by mouth  every 6 (six) hours as needed for muscle spasms.     . Multiple Vitamin (MULTIVITAMIN) tablet Take 1 tablet by mouth daily. For supplement    . rosuvastatin (CRESTOR) 20 MG tablet Take 1 tablet (20 mg total) by mouth daily. 90 tablet 3   No current facility-administered medications for this visit.    REVIEW OF SYSTEMS:   Constitutional: Denies fevers, chills or abnormal night sweats Eyes: Denies blurriness of vision, double vision or watery eyes Ears, nose, mouth, throat, and face: Denies mucositis or sore throat Respiratory: Denies cough,  dyspnea or wheezes Cardiovascular: Denies palpitation, chest discomfort or lower extremity swelling Gastrointestinal:  Denies nausea, heartburn or change in bowel habits Skin: Denies abnormal skin rashes Lymphatics: Denies new lymphadenopathy or easy bruising Neurological:Denies numbness, tingling or new weaknesses, (+) intermittent confusion  Behavioral/Psych: (+) anxiety, no new changes  All other systems were reviewed with the patient and are negative.  PHYSICAL EXAMINATION:  Filed Vitals:   11/26/14 0858  BP: 114/48  Pulse: 69  Temp: 99.2 F (37.3 C)  Resp: 17   Filed Weights   11/26/14 0858  Weight: 186 lb 11.2 oz (84.687 kg)    GENERAL:alert, no distress and comfortable, sitting in wheelchair  SKIN: skin color, texture, turgor are normal, no rashes or significant lesions EYES: normal, conjunctiva are pink and non-injected, sclera clear OROPHARYNX:no exudate, no erythema and lips, buccal mucosa, and tongue normal  NECK: supple, thyroid normal size, non-tender, without nodularity LYMPH:  no palpable lymphadenopathy in the cervical, axillary or inguinal LUNGS: clear to auscultation and percussion with normal breathing effort HEART: regular rate & rhythm and no murmurs and no lower extremity edema ABDOMEN:abdomen soft, non-tender and normal bowel sounds Musculoskeletal:no cyanosis of digits and no clubbing. (+) surgical scar at her  thoracic and lumbar spine  PSYCH: alert & oriented x 3 with fluent speech, but argues with her daughter a lot  NEURO: no focal motor/sensory deficits  LABORATORY DATA:  I have reviewed the data as listed CBC Latest Ref Rng 11/12/2014 11/08/2014 10/27/2014  WBC 4.6 - 10.2 K/uL 6.6 6.6 6.6  Hemoglobin 12.2 - 16.2 g/dL 12.0(A) 11.8 10.8(L)  Hematocrit 37.7 - 47.9 % 38.9 38.0 35.1(L)  Platelets 145 - 400 10e3/uL - 247 235      Recent Labs  04/24/14 1205  08/21/14 1141 08/28/14 1604 10/27/14 0455 11/08/14 1552 11/12/14 1155  NA 138  < > 141  --  137 139 140  K 4.5  < > 4.8  --  4.6 4.3 4.7  CL 97  < > 98  --  98  --  98  CO2 25  < > 29  --  29 29 27   GLUCOSE 95  < > 103*  --  100* 94 102*  BUN 14  < > 12  --  11 10.9 12  CREATININE 0.80  < > 0.84  --  0.77 0.7 0.79  CALCIUM 9.3  < > 9.5  --  9.3 9.5 9.6  GFRNONAA 73  < > 69  --  80*  --  73  GFRAA 84  < > 79  --  >90  --  85  PROT 6.2  --  6.2 5.9*  --  6.7  --   ALBUMIN  --   --   --   --   --  3.7  --   AST 46*  --  27 22  --  20  --   ALT 20  --  42* 21  --  12  --   ALKPHOS 107  --  278* 176*  --  104  --   BILITOT 0.3  --  0.4 0.2  --  0.26  --   BILIDIR 0.09  --  0.16 0.07  --   --   --   < > = values in this interval not displayed.  Protein electrophoresis, serum  Status: Finalresult Visible to patient:  MyChart Nextappt: 11/28/2014 at 02:30 PM in Cardiology Truitt Merle, NP)  Ref Range 2wk ago    Total Protein, Serum Electrophoresis 6.0 - 8.3 g/dL 6.6   Albumin ELP 55.8 - 66.1 % 54.8 (L)   Alpha-1-Globulin 2.9 - 4.9 % 5.3 (H)   Alpha-2-Globulin 7.1 - 11.8 % 13.5 (H)   Beta Globulin 4.7 - 7.2 % 6.5   Beta 2 3.2 - 6.5 % 5.2   Gamma Globulin 11.1 - 18.8 % 14.7   M-Spike, % g/dL 0.26      Vitamin B12 (Order [id number])      Vitamin B12  Status: Finalresult Visible to patient:  MyChart Nextappt: 11/28/2014 at 02:30 PM in Cardiology Truitt Merle, NP)             Ref Range 2wk ago (11/08/14) 8yrago (10/28/10) 465yrgo (04/08/10) 6y25yro (11/13/08)    Vitamin B-12 211 - 911 pg/mL 861 504R 591R 559R      Ferritin  Status: Finalresult Visible to patient:  MyChart Nextappt: 11/28/2014 at 02:30 PM in Cardiology (LOTruitt MerleP) Dx:  Anemia, unspecified anemia type           Ref Range 2wk ago (11/08/14) 23yr71yr (10/28/10) 23yr 63yr(04/08/10) 58yr a72yr11/24/09)    Ferritin 9 - 269 ng/ml 107 14.3R 17.9R 7.8 (L)R      Iron and TIBC [facility]  Status: Finalresult Visible to patient:  MyChart Nextappt: 11/28/2014 at 02:30 PM in Cardiology (LORI Truitt MerleDx:  Anemia, unspecified anemia type              Ref Range 2wk ago (11/08/14) 23yr ag75yr1/8/11) 25yr ago22yr19/11) 60yr ago 55yr24/09)    Iron 41 - 142 ug/dL 44 51R 76R 42R    TIBC 236 - 444 ug/dL 324       UIBC 120 - 384 ug/dL 280       %SAT 21 - 57 % 13 (L)         Soluble transferrin receptor (Order [id number] transferrin receptor  Status: Finalresult Visible to patient:  MyChart Nextappt: 11/28/2014 at 02:30 PM in Cardiology (LORI GERCecille Rubin, NP)            Ref Range 2wk ago    Transferrin Receptor, Soluble 0.76 - 1.76 mg/L 2.57 (H)       Erythropoietin  Status: Finalresult Visible to patient:  MyChart Nextappt: 11/28/2014 at 02:30 PM in Cardiology (LORI GERTruitt Merle  Anemia, unspecified anemia type         Ref Range 2wk ago    Erythropoietin 2.6 - 18.5 mIU/mL 13.5      Morphology  Status: Finalresult Visible to patient:  MyChart Nextappt: 11/28/2014 at 02:30 PM in Cardiology (LORI GERTruitt Merle  Anemia, unspecified anemia type         Ref Range 2wk ago    Polychromasia Slight  Slight   Tear Drop Cells Negative  Few   White Cell Comments  Variant Lymphs   PLT EST Adequate  Adequate   Platelet Morphology Within Normal Limits   Large Platelets         RADIOGRAPHIC STUDIES: I have personally reviewed the radiological images as listed and agreed with the findings in the report. Ct Head Wo Contrast 10/27/2014   IMPRESSION: No acute intracranial abnormalities. Chronic atrophy and small vessel ischemic changes.     Ct Cervical Spine Wo Contrast 10/27/2014 IMPRESSION:  1. No evidence of acute fracture or malalignment.  2. Multilevel cervical spondylosis and facet arthropathy without focality.  3. Atherosclerotic vascular calcifications in both carotid arteries.      ASSESSMENT & PLAN:  [name] [name] is a 73 year old female with multiple comorbidities, who presents with moderate persistent anemia after her spinal fusion surgery which required blood transfusion 3 times.  1. Normocytic hypoproductive anemia, mild anemia of iron deficiency, rule out MM  -I reviewed her the above lab result with her and  Her daughter. No lab evidence of megaloblastic anemia, or hemolysis.   -She has normal iron level and ferritin, but low iron saturation and elevated soluble transferrin receptor, and history of very low ferritin in the past (<10), which support IDA.  -given her positive M-protein and, will like to rule out MM.   2. MGUS -SPEP showed low level M-protein, she likely has MGUS. Will obtain IFE and immunoglobin levels, and light chain  -given her anemia, will do a bone survey and bone marrow biopsy to rule out MM -follow up   3. Confusion, anxiety -she is scheduled to see neurology   4. HTN, AF, dyslipidemia  -follow up with PCP   Plan -bone marrow biopsy -IFE, Ig, light chain level, UPEP and IFE -Return in one month    All questions were answered. The patient knows to call the clinic with any problems, questions or concerns. I spent 20 minutes counseling the patient face to face. The total time spent in the appointment was 30 minutes and more than 50% was on counseling.     Orders Placed This Encounter   Procedures  . DG Bone Survey Met    Standing Status: Future     Number of Occurrences:      Standing Expiration Date: 01/28/2016    Order Specific Question:  Reason for Exam (SYMPTOM  OR DIAGNOSIS REQUIRED)    Answer:  MGUS, rule out MM    Order Specific Question:  Preferred imaging location?    Answer:  [facility]  . CT Biopsy    Standing Status: Future     Number of Occurrences:      Standing Expiration Date: 11/26/2015    Order Specific Question:  Lab orders requested (DO NOT place separate lab orders, these will be automatically ordered during procedure specimen collection):    Answer:  Surgical Pathology    Order Specific Question:  Reason for Exam (SYMPTOM  OR DIAGNOSIS REQUIRED)    Answer:  MGUS and anemia, rule out multiple myeloma    Order Specific Question:  Preferred imaging location?    Answer:  [facility]  . CBC & Diff and Retic    Standing Status: Future     Number of Occurrences:      Standing Expiration Date: 11/26/2015       Truitt Merle, MD 11/26/2014 8:56 PM

## 2014-11-26 NOTE — Telephone Encounter (Signed)
Gave avs & cal for Dec. °

## 2014-11-28 ENCOUNTER — Encounter: Payer: Self-pay | Admitting: Nurse Practitioner

## 2014-11-28 ENCOUNTER — Ambulatory Visit (INDEPENDENT_AMBULATORY_CARE_PROVIDER_SITE_OTHER): Payer: Medicare Other | Admitting: Nurse Practitioner

## 2014-11-28 ENCOUNTER — Emergency Department (HOSPITAL_COMMUNITY)
Admission: EM | Admit: 2014-11-28 | Discharge: 2014-11-29 | Disposition: A | Discharge status: Home / Self Care | Payer: Medicare Other | Attending: Emergency Medicine | Admitting: Emergency Medicine

## 2014-11-28 ENCOUNTER — Telehealth: Payer: Self-pay | Admitting: Physician Assistant

## 2014-11-28 ENCOUNTER — Other Ambulatory Visit: Payer: Self-pay

## 2014-11-28 ENCOUNTER — Emergency Department (HOSPITAL_COMMUNITY): Payer: Medicare Other

## 2014-11-28 ENCOUNTER — Encounter (HOSPITAL_COMMUNITY): Payer: Self-pay | Admitting: Adult Health

## 2014-11-28 ENCOUNTER — Other Ambulatory Visit: Payer: Self-pay | Admitting: Nurse Practitioner

## 2014-11-28 VITALS — BP 128/62 | HR 62 | Ht 67.0 in | Wt 185.8 lb

## 2014-11-28 DIAGNOSIS — F329 Major depressive disorder, single episode, unspecified: Secondary | ICD-10-CM | POA: Diagnosis not present

## 2014-11-28 DIAGNOSIS — R079 Chest pain, unspecified: Secondary | ICD-10-CM | POA: Diagnosis not present

## 2014-11-28 DIAGNOSIS — R7989 Other specified abnormal findings of blood chemistry: Secondary | ICD-10-CM | POA: Diagnosis not present

## 2014-11-28 DIAGNOSIS — Z7982 Long term (current) use of aspirin: Secondary | ICD-10-CM | POA: Insufficient documentation

## 2014-11-28 DIAGNOSIS — I10 Essential (primary) hypertension: Secondary | ICD-10-CM

## 2014-11-28 DIAGNOSIS — E7800 Pure hypercholesterolemia, unspecified: Secondary | ICD-10-CM

## 2014-11-28 DIAGNOSIS — I5032 Chronic diastolic (congestive) heart failure: Secondary | ICD-10-CM | POA: Diagnosis not present

## 2014-11-28 DIAGNOSIS — G4700 Insomnia, unspecified: Secondary | ICD-10-CM | POA: Insufficient documentation

## 2014-11-28 DIAGNOSIS — E669 Obesity, unspecified: Secondary | ICD-10-CM | POA: Insufficient documentation

## 2014-11-28 DIAGNOSIS — Z79899 Other long term (current) drug therapy: Secondary | ICD-10-CM | POA: Insufficient documentation

## 2014-11-28 DIAGNOSIS — E785 Hyperlipidemia, unspecified: Secondary | ICD-10-CM | POA: Insufficient documentation

## 2014-11-28 DIAGNOSIS — K219 Gastro-esophageal reflux disease without esophagitis: Secondary | ICD-10-CM | POA: Insufficient documentation

## 2014-11-28 DIAGNOSIS — I480 Paroxysmal atrial fibrillation: Secondary | ICD-10-CM

## 2014-11-28 DIAGNOSIS — K2970 Gastritis, unspecified, without bleeding: Secondary | ICD-10-CM | POA: Diagnosis not present

## 2014-11-28 DIAGNOSIS — Z86010 Personal history of colonic polyps: Secondary | ICD-10-CM | POA: Diagnosis not present

## 2014-11-28 LAB — I-STAT TROPONIN, ED: Troponin i, poc: 0 ng/mL (ref 0.00–0.08)

## 2014-11-28 LAB — PRO B NATRIURETIC PEPTIDE: Pro B Natriuretic peptide (BNP): 365.9 pg/mL (ref 0–450)

## 2014-11-28 LAB — BASIC METABOLIC PANEL
Anion gap: 12 (ref 5–15)
BUN: 11 mg/dL (ref 6–23)
CALCIUM: 9.1 mg/dL (ref 8.4–10.5)
CO2: 24 mEq/L (ref 19–32)
Chloride: 99 mEq/L (ref 96–112)
Creatinine, Ser: 0.63 mg/dL (ref 0.50–1.10)
GFR calc non Af Amer: 86 mL/min — ABNORMAL LOW (ref >90)
GLUCOSE: 141 mg/dL — AB (ref 70–99)
Potassium: 4 mEq/L (ref 3.7–5.3)
Sodium: 135 mEq/L — ABNORMAL LOW (ref 137–147)

## 2014-11-28 LAB — CBC
HCT: 36.3 % (ref 36.0–46.0)
Hemoglobin: 11.6 g/dL — ABNORMAL LOW (ref 12.0–15.0)
MCH: 26.7 pg (ref 26.0–34.0)
MCHC: 32 g/dL (ref 30.0–36.0)
MCV: 83.6 fL (ref 78.0–100.0)
PLATELETS: 194 10*3/uL (ref 150–400)
RBC: 4.34 MIL/uL (ref 3.87–5.11)
RDW: 17.2 % — AB (ref 11.5–15.5)
WBC: 4.7 10*3/uL (ref 4.0–10.5)

## 2014-11-28 LAB — D-DIMER, QUANTITATIVE: D-Dimer, Quant: 1.12 ug/mL-FEU — ABNORMAL HIGH (ref 0.00–0.48)

## 2014-11-28 MED ORDER — IOHEXOL 350 MG/ML SOLN: 100.0000 mL | Freq: Once | INTRAVENOUS | Status: AC | PRN

## 2014-11-28 NOTE — Progress Notes (Addendum)
 [name] [name] Date of Birth: 08/27/1941 Medical Record #[id number]  History of Present Illness: [name] [name] is seen back today for a follow up visit - seen for Dr. McLean. She is a 73 year old female with HTN, HLD and nonobstructive CAD. She has had back issues with past fusion/rods, etc. CHADSVASC is 5 with annual stroke risk of 6.7% (due to age, gender, HTN, vascular disease)  Seen here in early September for a pre op clearance - was felt to be doing well.   Had posterior T3-12 spinal fusion later in September over at [facility] and had post op AF - she was cardioverted to NSR. Seen by EP at [facility]. Was to consider amiodarone if had recurrence. EF 65 to 70% noted.   Comes back today. Here with her daughter. Her daughter provides most of the history. She has lots of complaints. She has had chest soreness - pretty constant since her surgery in September. She is short of breath. Has had swelling in her legs/feet. Hard to say how much salt she is getting. She has been to rehab for PT. Has had 2 falls. Has gotten anemic - required transfusion. Now with an elevated protein and M spike - to see oncology and have bone marrow biopsy and bone survey. She has had some confusion as well. No real palpitations reported. She had no awareness of her AF. No cough. Pretty sedentary.   Current Outpatient Prescriptions  Medication Sig Dispense Refill  . amLODipine (NORVASC) 2.5 MG tablet Take 1 tablet by mouth  daily. For HTN 90 tablet 3  . aspirin EC 81 MG tablet Take 81 mg by mouth daily. For prophylasxis    . Calcium Carbonate-Vitamin D (CALCIUM-VITAMIN D) 500-200 MG-UNIT per tablet Take 1 tablet by mouth daily. 1200mg-1000mg    . colesevelam (WELCHOL) 625 MG tablet Take 625 mg by mouth daily with breakfast.    . diphenoxylate-atropine (LOMOTIL) 2.5-0.025 MG per tablet Take 1 tablet by mouth 4 (four) times daily as needed for diarrhea or loose stools.     . esomeprazole (NEXIUM) 40 MG capsule Take 1 capsule (40 mg  total) by mouth 2 (two) times daily before a meal. For GERD 180 capsule 3  . Eszopiclone 3 MG TABS Take 1 tablet (3 mg total) by mouth at bedtime. For insomnia,take immediately before bedtime. 90 tablet 1  . fentaNYL (DURAGESIC - DOSED MCG/HR) 50 MCG/HR Place 1 patch (50 mcg total) onto the skin every 3 (three) days. 10 patch 0  . FLUoxetine (PROZAC) 40 MG capsule Take 1 capsule (40 mg total) by mouth daily. For depression 90 capsule 3  . furosemide (LASIX) 20 MG tablet Take 1 tablet (20 mg total) by mouth daily. 90 tablet 3  . HYDROcodone-acetaminophen (NORCO) 10-325 MG per tablet Take 1 tablet by mouth every 4 (four) hours as needed.    . iron polysaccharides (NIFEREX) 150 MG capsule Take 1 capsule (150 mg total) by mouth daily. 90 capsule 3  . LORazepam (ATIVAN) 0.5 MG tablet Take 1 tablet (0.5 mg total) by mouth 2 (two) times daily as needed for anxiety. 60 tablet 1  . meclizine (ANTIVERT) 25 MG tablet Take 1 tablet (25 mg total) by mouth 3 (three) times daily. (Patient taking differently: Take 25 mg by mouth 3 (three) times daily as needed. ) 30 tablet 0  . methocarbamol (ROBAXIN) 500 MG tablet Take 500 mg by mouth every 6 (six) hours as needed for muscle spasms.     . Multiple   Vitamin (MULTIVITAMIN) tablet Take 1 tablet by mouth daily. For supplement    . rosuvastatin (CRESTOR) 20 MG tablet Take 1 tablet (20 mg total) by mouth daily. (Patient taking differently: Take 20 mg by mouth daily. 20 mg one day alternate 30 mg the next day) 90 tablet 3   No current facility-administered medications for this visit.    Allergies  Allergen Reactions  . Morphine And Related Itching  . Altace [Ramipril]   . Cymbalta [Duloxetine Hcl] Other (See Comments)    Personality changes - crying  2014  . Duloxetine     REACTION: agitaded- weight gain  . Floxin [Ofloxacin]   . Hydromorphone Other (See Comments)  . Lipitor [Atorvastatin]   . Lovaza [Omega-3-Acid Ethyl Esters]   . Oxycodone-Acetaminophen   .  Percocet [Oxycodone-Acetaminophen]   . Pravachol [Pravastatin Sodium]   . Trilipix [Choline Fenofibrate]   . Valium [Diazepam] Other (See Comments)    Personality changes - "in another world" ;  Hallucinations  2015  . Zetia [Ezetimibe]   . Zocor [Simvastatin]     Past Medical History  Diagnosis Date  . Obesity   . Circadian rhythm sleep disorder   . CTS (carpal tunnel syndrome)   . Diarrhea   . IBS (irritable bowel syndrome)   . Diverticulitis of colon   . Gastritis   . Esophageal stricture   . Personal history of colonic polyps 10/25/2011 & 12/02/11    not retrieved Dr Lyla Son & tubular adenomas  . Hiatal hernia   . GERD (gastroesophageal reflux disease)   . Obstructive sleep apnea   . Insomnia   . HTN (hypertension)   . Hyperlipidemia   . Depression   . DIASTOLIC HEART FAILURE, CHRONIC 04/04/2009    Qualifier: Diagnosis of  By: Romona Curls      Past Surgical History  Procedure Laterality Date  . Carpal tunnel release    . Cholecystectomy  2003  . Total knee arthroplasty      bilateral    History  Smoking status  . Never Smoker   Smokeless tobacco  . Never Used    History  Alcohol Use  . Yes    Comment: very rare    Family History  Problem Relation Age of Onset  . Coronary artery disease Father   . Peripheral vascular disease Father   . Coronary artery disease Mother   . Coronary artery disease Brother   . Colon cancer Sister   . Emphysema Sister   . Sleep apnea Son   . Colon cancer Sister     spread to her brain    Review of Systems: The review of systems is per the HPI.  All other systems were reviewed and are negative.  Physical Exam: BP 128/62 mmHg  Pulse 62  Ht 5' 7" (1.702 m)  Wt 185 lb 12.8 oz (84.278 kg)  BMI 29.09 kg/m2 Patient is alert and in no acute distress. Affect flat. Seems depressed. Crying at times. Skin is warm and dry. Color is normal.  HEENT is unremarkable. Normocephalic/atraumatic. PERRL. Sclera are  nonicteric. Neck is supple. No masses. No JVD. Lungs are clear. Cardiac exam shows a regular rate and rhythm. Abdomen is soft. Extremities are fleshy with trace edema. Gait and ROM are intact. She is using a walker.  No gross neurologic deficits noted.  Wt Readings from Last 3 Encounters:  11/28/14 185 lb 12.8 oz (84.278 kg)  11/26/14 186 lb 11.2 oz (84.687 kg)  11/12/14 183  lb (83.008 kg)    LABORATORY DATA/PROCEDURES:  Lab Results  Component Value Date   WBC 6.6 11/12/2014   HGB 12.0* 11/12/2014   HCT 38.9 11/12/2014   PLT 247 11/08/2014   GLUCOSE 102* 11/12/2014   CHOL 138 08/21/2014   TRIG 171* 08/21/2014   HDL 54 08/21/2014   LDLCALC 50 08/21/2014   ALT 12 11/08/2014   AST 20 11/08/2014   NA 140 11/12/2014   K 4.7 11/12/2014   CL 98 11/12/2014   CREATININE 0.79 11/12/2014   BUN 12 11/12/2014   CO2 27 11/12/2014   TSH 1.603 04/13/2013   INR 1.01 11/08/2014   HGBA1C 5.6 08/21/2014    BNP (last 3 results) No results for input(s): PROBNP in the last 8760 hours.   CARDIAC CATHETERIZATION  INDICATIONS: Ms. Detamore is a very nice 72 year old female well known to me. She has a long history of shortness of breath, chest pain, and hyperlipidemia. She has had prior cardiac catheterization. She has had increasing shortness of breath as of late, and she and her family were extensively concerned that perhaps she had progression of coronary disease. We have been following her fairly closely recently and after a thorough discussion elected to recommend repeat catheterization. She and her family were pushing to proceed.  PROCEDURES: 1. Left heart catheterization. 2. Selective coronary arteriography. 3. Selective left ventriculography.  DESCRIPTION OF PROCEDURE: The patient was brought to the cath lab, prepped and draped in the usual fashion. Through an anterior puncture, the femoral artery was easily entered, and 5-French sheath was  placed. Standard left Judkins catheter was utilized to engage the left coronary and Jodelle Red was used to engage the right coronary artery. Central aortic and left ventricular pressures were measured with the pigtail. Ventriculography was then performed in the RAO projection. There were no complications. She was taken to the holding area in satisfactory clinical condition after completion of the films. I showed some of the films to the patient in the catheterization laboratory, then reviewed the films with her daughters in the holding area. She had no major complications.  HEMODYNAMIC DATA: 1. Central aortic pressure 141/71, mean 93. 2. Left ventricular pressure 139/90. 3. There was no gradient pullback across the aortic valve.  ANGIOGRAPHIC DATA: 1. Ventriculography done in the RAO projection reveals vigorous global  systolic function without segmental wall motion abnormality. 2. The coronaries are generally calcified, similar to the previous  study with some perhaps advancement and calcification in the LAD. 3. The left main is free of critical disease. 4. The LAD has some diffuse mild plaquing of approximately 30% in the  mid vessel. The first diagonal also has perhaps 30-40% narrowing.  There is some moderate calcification near the LAD diagonal  bifurcation. The remainder of the vessel has luminal irregularity,  but no significant focal obstruction. 5. The circumflex demonstrates an ostial 50% stenosis. The lumen does  not appear to be significantly compromised, and comparing shot per  shot with the previous angiographic study does not appear to  demonstrate significant progression. Mild progression can  certainly not be excluded. The remainder of the circumflex system  is without critical narrowing. 6. The right coronary artery is a large-caliber vessel with a smaller  bifurcating posterior descending and  a larger posterolateral  branch. The mid vessel demonstrates a fairly long segmental area  of plaquing that would measure 30-40% luminal reduction diffusely.  CONCLUSIONS: 1. Well-preserved left ventricular function. 2. Calcification of the coronary arteries. 3. Nonobstructive coronary plaque.  DISPOSITION: We will continue to evaluate the patient. We did do a blood gas today; the pO2 was 65 and pH was normal. Oxygen saturation is 92%. D-dimer is being obtained. We may consider CT scanning of the chest and continued followup with Pulmonary Medicine. The patient has severe sleep apnea.   Thomas D. Stuckey, MD, FACC Electronically Signed   Myoview Impression from November 2014 Exercise Capacity: Lexiscan with no exercise. BP Response: Normal blood pressure response. Clinical Symptoms: There is dyspnea. ECG Impression: No significant ST segment change suggestive of ischemia. Comparison with Prior Nuclear Study: No images to compare  Overall Impression: Normal stress nuclear study.Significant lateral bowel artifact on resting images  LV Ejection Fraction: 68%. LV Wall Motion: NL LV Function; NL Wall Motion  Peter Nishan  Normal study. Please inform patient.   Dalton McLean 10/25/2013   Assessment / Plan: 1. PAF - cardioverted back in September following back surgery -  EKG today shows sinus. May have short PR but looks unchanged from prior tracings. Lots of artifact on today's tracing as well. She has an elevated CHADSVASC score but has had falls, is unsteady and has been transfused and now with elevated protein and M spike reported. I do not think anticoagulation other than aspirin is warranted at this time.   2. HTN -  BP ok at this time  3. HLD  4. Chest pain - known CAD - does not sound cardiac - will update her Myoview. EKG not acute.   5. Dyspnea - negative doppler for DVT last month - recheck d dimer - may need further  testing. Most likely from deconditioning.   Prognosis tenuous at best. Will get her back to see Dr. McLean for discussion.   Patient is agreeable to this plan and will call if any problems develop in the interim.    C. , RN, ANP-C [facility] [address] [place], [place]  [address] [phone]   

## 2014-11-28 NOTE — Patient Instructions (Addendum)
We will be checking the following labs today BNP, BMET, d dimer  Based on these labs - we may need to do further testing  We need to update your stress test Carlton Adam)   See Dr. Aundra Dubin in follow up for discussion.  Call the [place] office at [phone] if you have any questions, problems or concerns.

## 2014-11-28 NOTE — Telephone Encounter (Signed)
Contacted by [facility] regarding abnormal d-dimer of 1.12, normal range 0.00-0.48. Called the patient's home phoen, there was no answer. Called her cell phone, patient's daughter picked up. Explained positive d-dimer may means blood clot however nonspecific, her mother should seek medical attention if continue to have SOB at local ED for CTA of chest or U/S of leg. Patient's daughter is aware and will relay the message to her mother.   Hilbert Corrigan PA Pager: [phone]

## 2014-11-28 NOTE — ED Notes (Signed)
IV start unsuccessful; 2nd RN to attempt IV start for CT Angio

## 2014-11-28 NOTE — ED Notes (Signed)
Patient transported to CT 

## 2014-11-28 NOTE — Telephone Encounter (Signed)
I have also called this result to the patient's daughter. Patient does not wish to go to ER. Explained the risks associated with a + d dimer. She wishes to come back here in the AM for CT angio of the chest. Order has been placed. Explained multiple times to the daughter - [name] - to go to the nearest ER or call EMS if problems arise or she has worsening symptoms.

## 2014-11-28 NOTE — ED Notes (Signed)
PA at bedside.

## 2014-11-28 NOTE — ED Notes (Signed)
Presents from doctor office with an elevated D-Dimer. Pt had back surgery in September and is recently having confusion and being tested for Multiple myeloma-seeing Dr. Morey Hummingbird, she reports chest soreness and pain with inspiration as well as SOB that comes and goes. Left leg more edamatous than right.

## 2014-11-28 NOTE — ED Provider Notes (Signed)
CSN: [id number]     Arrival date & time 11/28/14  2031 History   First MD Initiated Contact with Patient 11/28/14 2120     Chief Complaint  Patient presents with  . Abnormal Lab     (Consider location/radiation/quality/duration/timing/severity/associated sxs/prior Treatment) The history is provided by the patient and medical records.   This is a 73 year old female with past medical history significant for hypertension, hyperlipidemia, depression, GERD, chronic diastolic heart failure, sleep apnea, presenting to the ED for abnormal lab.  Patient was seen in the cardiology office today for routine scheduled follow-up. She had a d-dimer drawn which was positive at 1.12.  Patient initially was going to follow-up in the office tomorrow to have CTA of chest performed, however family convinced her to come to the ED for more emergent evaluation.  Patient states since having back surgery in September she has been having a "soreness" in the left side of her chest with radiation to her back. She denies any recent cough, fever, sweats, or chills.  No prior history of DVT or PE. She does note some increased peripheral edema since her cardiologist, Dr. Aundra Dubin, decreased her lasix from 40mg  to 20mg .  She denies any calf pain.  Vital signs stable on arrival.  Past Medical History  Diagnosis Date  . Obesity   . Circadian rhythm sleep disorder   . CTS (carpal tunnel syndrome)   . Diarrhea   . IBS (irritable bowel syndrome)   . Diverticulitis of colon   . Gastritis   . Esophageal stricture   . Personal history of colonic polyps 10/25/2011 & 12/02/11    not retrieved Dr Lyla Son & tubular adenomas  . Hiatal hernia   . GERD (gastroesophageal reflux disease)   . Obstructive sleep apnea   . Insomnia   . HTN (hypertension)   . Hyperlipidemia   . Depression   . DIASTOLIC HEART FAILURE, CHRONIC 04/04/2009    Qualifier: Diagnosis of  By: Romona Curls     Past Surgical History  Procedure Laterality  Date  . Carpal tunnel release    . Cholecystectomy  2003  . Total knee arthroplasty      bilateral   Family History  Problem Relation Age of Onset  . Coronary artery disease Father   . Peripheral vascular disease Father   . Coronary artery disease Mother   . Coronary artery disease Brother   . Colon cancer Sister   . Emphysema Sister   . Sleep apnea Son   . Colon cancer Sister     spread to her brain   History  Substance Use Topics  . Smoking status: Never Smoker   . Smokeless tobacco: Never Used  . Alcohol Use: Yes     Comment: very rare   OB History    No data available     Review of Systems  Respiratory: Positive for shortness of breath.   Cardiovascular: Positive for chest pain.  All other systems reviewed and are negative.     Allergies  Morphine and related; Altace; Cymbalta; Duloxetine; Floxin; Hydromorphone; Lipitor; Lovaza; Oxycodone-acetaminophen; Percocet; Pravachol; Trilipix; Valium; Zetia; and Zocor  Home Medications   Prior to Admission medications   Medication Sig Start Date End Date Taking? Authorizing Provider  amLODipine (NORVASC) 2.5 MG tablet Take 1 tablet by mouth  daily. For HTN 11/13/14   [name], MD  aspirin EC 81 MG tablet Take 81 mg by mouth daily. For prophylasxis    Historical Provider, MD  Calcium Carbonate-Vitamin D (CALCIUM-VITAMIN D) 500-200 MG-UNIT per tablet Take 1 tablet by mouth daily. 1200mg -1000mg     Historical Provider, MD  colesevelam (WELCHOL) 625 MG tablet Take 625 mg by mouth daily with breakfast.    Historical Provider, MD  diphenoxylate-atropine (LOMOTIL) 2.5-0.025 MG per tablet Take 1 tablet by mouth 4 (four) times daily as needed for diarrhea or loose stools.  08/17/14   Historical Provider, MD  esomeprazole (NEXIUM) 40 MG capsule Take 1 capsule (40 mg total) by mouth 2 (two) times daily before a meal. For GERD 11/13/14   [name], MD  Eszopiclone 3 MG TABS Take 1 tablet (3 mg total) by mouth at bedtime. For  insomnia,take immediately before bedtime. 11/13/14   [name], MD  fentaNYL (DURAGESIC - DOSED MCG/HR) 50 MCG/HR Place 1 patch (50 mcg total) onto the skin every 3 (three) days. 10/31/14   [name], MD  FLUoxetine (PROZAC) 40 MG capsule Take 1 capsule (40 mg total) by mouth daily. For depression 11/13/14   [name], MD  furosemide (LASIX) 20 MG tablet Take 1 tablet (20 mg total) by mouth daily. 11/13/14   [name], MD  HYDROcodone-acetaminophen [facility]) 10-325 MG per tablet Take 1 tablet by mouth every 4 (four) hours as needed.    Historical Provider, MD  iron polysaccharides (NIFEREX) 150 MG capsule Take 1 capsule (150 mg total) by mouth daily. 11/13/14   [name], MD  LORazepam (ATIVAN) 0.5 MG tablet Take 1 tablet (0.5 mg total) by mouth 2 (two) times daily as needed for anxiety. 10/31/14   [name], MD  meclizine (ANTIVERT) 25 MG tablet Take 1 tablet (25 mg total) by mouth 3 (three) times daily. Patient taking differently: Take 25 mg by mouth 3 (three) times daily as needed.  10/28/14   Charlott Rakes, MD  methocarbamol (ROBAXIN) 500 MG tablet Take 500 mg by mouth every 6 (six) hours as needed for muscle spasms.     Historical Provider, MD  Multiple Vitamin (MULTIVITAMIN) tablet Take 1 tablet by mouth daily. For supplement    Historical Provider, MD  rosuvastatin (CRESTOR) 20 MG tablet Take 1 tablet (20 mg total) by mouth daily. Patient taking differently: Take 20 mg by mouth daily. 20 mg one day alternate 30 mg the next day 11/13/14   [name], MD   BP 135/50 mmHg  Pulse 64  Temp(Src) 98.6 F (37 C) (Oral)  Resp 16  SpO2 97%   Physical Exam  Constitutional: She is oriented to person, place, and time. She appears well-developed and well-nourished. No distress.  HENT:  Head: Normocephalic and atraumatic.  Mouth/Throat: Oropharynx is clear and moist.  Eyes: Conjunctivae and EOM are normal. Pupils are equal, round, and reactive to light.  Neck: Normal  range of motion. Neck supple.  Cardiovascular: Normal rate, regular rhythm and normal heart sounds.   Pulmonary/Chest: Effort normal and breath sounds normal. No respiratory distress. She has no wheezes.  Abdominal: Soft. Bowel sounds are normal. There is no tenderness. There is no guarding.  Musculoskeletal: Normal range of motion. She exhibits no edema.  Trace pitting edema bilateral lower extremities No appreciable calf asymmetry, tenderness, or palpable cords; no overlying erythema or warmth to touch; DP pulses intact bilaterally  Neurological: She is alert and oriented to person, place, and time.  Skin: Skin is warm and dry. She is not diaphoretic.  Psychiatric: She has a normal mood and affect.  Nursing note and vitals  reviewed.   ED Course  Procedures (including critical care time) Labs Review Labs Reviewed  CBC - Abnormal; Notable for the following:    Hemoglobin 11.6 (*)    RDW 17.2 (*)    All other components within normal limits  BASIC METABOLIC PANEL - Abnormal; Notable for the following:    Sodium 135 (*)    Glucose, Bld 141 (*)    GFR calc non Af Amer 86 (*)    All other components within normal limits  PRO B NATRIURETIC PEPTIDE  I-STAT TROPOININ, ED    Imaging Review Ct Angio Chest Pe W/cm &/or Wo Cm  11/28/2014   CLINICAL DATA:  Elevated D-dimer, chest soreness, pain with inspiration, shortness of breath, leg edema LEFT greater than RIGHT  EXAM: CT ANGIOGRAPHY CHEST WITH CONTRAST  TECHNIQUE: Multidetector CT imaging of the chest was performed using the standard protocol during bolus administration of intravenous contrast. Multiplanar CT image reconstructions and MIPs were obtained to evaluate the vascular anatomy.  CONTRAST:  100 cc Omnipaque 350 IV  COMPARISON:  03/10/2010  FINDINGS: Aorta normal caliber were without aneurysm or dissection.  Scattered atherosclerotic calcifications aorta and coronary arteries.  Extensive beam hardening artifacts from orthopedic prior  hardware in the spine.  Pulmonary arteries patent.  No evidence of pulmonary embolism.  Large hiatal hernia.  No thoracic adenopathy.  Visualized portion of upper abdomen significant only for prior cholecystectomy.  Within limitations of beam hardening artifacts, no gross pulmonary infiltrate, pleural effusion or pneumothorax identified.  No acute osseous findings.  Review of the MIP images confirms the above findings.  IMPRESSION: No evidence of pulmonary embolism.  Large hiatal hernia.   Electronically Signed   By: Lavonia Dana M.D.   On: 11/28/2014 23:47     EKG Interpretation None      MDM   Final diagnoses:  Chest pain   73 year old female here for evaluation of positive d-dimer drawn at cardiology office. On exam, patient no acute distress. She has no peripheral signs of DVT, mild pitting edema of BLE. Her respirations are unlabored and lungs are clear.  Will proceed with lab work and CT angio of chest.  EKG sinus rhythm without ischemic change. Labwork as above.  Troponin negative.  CT angiogram negative for PE, hiatal hernia noted.  At this time, low suspicion for ACS, PE, dissection, or other acute cardiac event. Clinically do not feel that patient has a DVT, however I have discussed option of vascular studies of her bilateral lower extremities to exclude DVT-- patient has deferred at this time.  Patient will follow up with her cardiologist.  Discussed plan with patient, he/she acknowledged understanding and agreed with plan of care.  Return precautions given for new or worsening symptoms.  [place] discussed with attending physician, Dr. Colin Rhein, who evaluated patient and agrees with assessment and plan of care.  Larene Pickett, PA-C 11/29/14 0101  Debby Freiberg, MD 12/03/14 [phone]

## 2014-11-28 NOTE — ED Notes (Signed)
Pt placed in gown and in bed. Pt monitored by pulse ox, bp cuff, and 12-lead. 

## 2014-11-29 ENCOUNTER — Inpatient Hospital Stay: Admission: RE | Admit: 2014-11-29 | Payer: Medicare Other | Source: Ambulatory Visit

## 2014-11-29 LAB — BASIC METABOLIC PANEL
BUN: 11 mg/dL (ref 6–23)
CO2: 29 mEq/L (ref 19–32)
Calcium: 9 mg/dL (ref 8.4–10.5)
Chloride: 101 mEq/L (ref 96–112)
Creatinine, Ser: 0.7 mg/dL (ref 0.4–1.2)
GFR: 85.24 mL/min (ref >60.00)
Glucose, Bld: 85 mg/dL (ref 70–99)
Potassium: 4.5 mEq/L (ref 3.5–5.1)
Sodium: 137 mEq/L (ref 135–145)

## 2014-11-29 LAB — BRAIN NATRIURETIC PEPTIDE: Pro B Natriuretic peptide (BNP): 120 pg/mL — ABNORMAL HIGH (ref 0.0–100.0)

## 2014-11-29 NOTE — Discharge Instructions (Signed)
Your chest CT was negative for blood clot in your lungs today. Follow-up with Dr. Aundra Dubin. Return to the ED for new or worsening symptoms.

## 2014-12-03 ENCOUNTER — Other Ambulatory Visit: Payer: Medicare Other

## 2014-12-03 ENCOUNTER — Telehealth: Payer: Self-pay | Admitting: *Deleted

## 2014-12-03 MED ORDER — SULFAMETHOXAZOLE-TRIMETHOPRIM 800-160 MG PO TABS: 1.0000 | ORAL_TABLET | Freq: Two times a day (BID) | ORAL | Status: DC

## 2014-12-03 NOTE — Telephone Encounter (Signed)
Sick - antiobio-  CIPRO - per dwm sent

## 2014-12-04 ENCOUNTER — Ambulatory Visit (INDEPENDENT_AMBULATORY_CARE_PROVIDER_SITE_OTHER): Payer: Medicare Other | Admitting: Neurology

## 2014-12-04 ENCOUNTER — Encounter: Payer: Self-pay | Admitting: Neurology

## 2014-12-04 VITALS — BP 119/69 | HR 68 | Ht 67.0 in | Wt 184.6 lb

## 2014-12-04 DIAGNOSIS — R5382 Chronic fatigue, unspecified: Secondary | ICD-10-CM

## 2014-12-04 DIAGNOSIS — R413 Other amnesia: Secondary | ICD-10-CM | POA: Insufficient documentation

## 2014-12-04 DIAGNOSIS — R5381 Other malaise: Secondary | ICD-10-CM

## 2014-12-04 HISTORY — DX: Other amnesia: R41.3

## 2014-12-04 NOTE — Progress Notes (Signed)
Reason for visit: Memory disturbance  [name] [name] is a 73 y.o. female  History of present illness:  [name] [name] is a 73 year old white female with a history of a memory disturbance that has been present for at least one year, possibly several years. The patient has had at least 2 back surgeries within the last year. The patient been placed on medications that include hydrocodone and Robaxin. The patient believes that the medications have had an adverse effect on her ability to remember things and to concentrate. The family indicates that the patient may have had some mild memory issues dating back as far 3 years. The patient is having some difficulty with her medications, and she gets assistance with this from her daughter. The patient oftentimes goes into rooms and she cannot remember why she went in. She will misplace things about the house frequently. She has not operated a motor vehicle since the beginning of 2015, but this has mainly been secondary to her back issues. The patient still does her finances, but this is becoming increasingly difficult. The patient does have some occasional episodes of dizziness when she first gets out of bed, and she will have brief dizziness lasting only a few seconds. She does not have dizziness at other times during the day. The patient does report some problems with fatigue, but she indicates that she sleeps fairly well. She does have some balance issues, and she uses a walker for ambulation. The patient fell at in the beginning of November 2015, and a CT scan of the brain was done around that time, and it was unremarkable. This was reviewed on line. The patient does have some tingling in the feet at times, and she has had a long-standing history of bowel and bladder incontinence issues. The patient is being evaluated for a monoclonal antibody, she will be sent for a bone marrow biopsy in the near future. She is sent to this office for further evaluation. A vitamin  B12 level previously was unremarkable.  Past Medical History  Diagnosis Date  . Obesity   . Circadian rhythm sleep disorder   . CTS (carpal tunnel syndrome)   . Diarrhea   . IBS (irritable bowel syndrome)   . Diverticulitis of colon   . Gastritis   . Esophageal stricture   . Personal history of colonic polyps 10/25/2011 & 12/02/11    not retrieved Dr Lyla Son & tubular adenomas  . Hiatal hernia   . GERD (gastroesophageal reflux disease)   . Obstructive sleep apnea   . Insomnia   . HTN (hypertension)   . Hyperlipidemia   . Depression   . DIASTOLIC HEART FAILURE, CHRONIC 04/04/2009    Qualifier: Diagnosis of  By: Mare Ferrari, RMA, Sherri    . Memory disorder 12/04/2014    Past Surgical History  Procedure Laterality Date  . Carpal tunnel release    . Cholecystectomy  2003  . Total knee arthroplasty      bilateral  . Cataract extraction Bilateral     Family History  Problem Relation Age of Onset  . Coronary artery disease Father   . Peripheral vascular disease Father   . Coronary artery disease Mother   . Coronary artery disease Brother   . Colon cancer Sister   . Emphysema Sister   . Sleep apnea Son   . Colon cancer Sister     spread to her brain  . Dementia Neg Hx   . Emphysema Brother  Social history:  reports that she has never smoked. She has never used smokeless tobacco. She reports that she drinks alcohol. She reports that she does not use illicit drugs.  Medications:  Current Outpatient Prescriptions on File Prior to Visit  Medication Sig Dispense Refill  . amLODipine (NORVASC) 2.5 MG tablet Take 1 tablet by mouth  daily. For HTN (Patient taking differently: Take 2.5 mg by mouth daily. ) 90 tablet 3  . aspirin EC 81 MG tablet Take 81 mg by mouth daily. For prophylasxis    . Calcium Carbonate-Vit D-Min (CALCIUM 1200) 1200-1000 MG-UNIT CHEW Chew 1 each by mouth daily.    . colesevelam (WELCHOL) 625 MG tablet Take 625 mg by mouth daily with breakfast.    .  diphenoxylate-atropine (LOMOTIL) 2.5-0.025 MG per tablet Take 1 tablet by mouth 4 (four) times daily as needed for diarrhea or loose stools.     Marland Kitchen esomeprazole (NEXIUM) 40 MG capsule Take 1 capsule (40 mg total) by mouth 2 (two) times daily before a meal. For GERD 180 capsule 3  . Eszopiclone 3 MG TABS Take 1 tablet (3 mg total) by mouth at bedtime. For insomnia,take immediately before bedtime. 90 tablet 1  . fentaNYL (DURAGESIC - DOSED MCG/HR) 50 MCG/HR Place 1 patch (50 mcg total) onto the skin every 3 (three) days. 10 patch 0  . FLUoxetine (PROZAC) 40 MG capsule Take 1 capsule (40 mg total) by mouth daily. For depression 90 capsule 3  . furosemide (LASIX) 20 MG tablet Take 1 tablet (20 mg total) by mouth daily. 90 tablet 3  . HYDROcodone-acetaminophen (NORCO) 10-325 MG per tablet Take 1 tablet by mouth every 4 (four) hours as needed for moderate pain.     . iron polysaccharides (NIFEREX) 150 MG capsule Take 1 capsule (150 mg total) by mouth daily. 90 capsule 3  . LORazepam (ATIVAN) 0.5 MG tablet Take 1 tablet (0.5 mg total) by mouth 2 (two) times daily as needed for anxiety. 60 tablet 1  . meclizine (ANTIVERT) 25 MG tablet Take 1 tablet (25 mg total) by mouth 3 (three) times daily. (Patient taking differently: Take 25 mg by mouth 3 (three) times daily as needed for dizziness. ) 30 tablet 0  . methocarbamol (ROBAXIN) 500 MG tablet Take 500 mg by mouth every 6 (six) hours as needed for muscle spasms.     . Multiple Vitamin (MULTIVITAMIN) tablet Take 1 tablet by mouth daily. For supplement    . rosuvastatin (CRESTOR) 20 MG tablet Take 1 tablet (20 mg total) by mouth daily. (Patient taking differently: Take 10-20 mg by mouth every other day. 20 mg one day alternate 10 mg the next day and repeats) 90 tablet 3  . sulfamethoxazole-trimethoprim (BACTRIM DS,SEPTRA DS) 800-160 MG per tablet Take 1 tablet by mouth 2 (two) times daily. 20 tablet 0   No current facility-administered medications on file prior to  visit.      Allergies  Allergen Reactions  . Cymbalta [Duloxetine Hcl] Other (See Comments)    Personality changes - crying  2014  . Valium [Diazepam] Other (See Comments)    Personality changes - "in another world" ;  Hallucinations  2015  . Duloxetine Other (See Comments)    REACTION: agitated- weight gain  . Morphine And Related Itching  . Altace [Ramipril] Other (See Comments)    unknown  . Floxin [Ofloxacin] Other (See Comments)    unknown  . Hydromorphone Other (See Comments)    unknown  . Lipitor [Atorvastatin] Other (  See Comments)    unknown  . Lovaza [Omega-3-Acid Ethyl Esters] Other (See Comments)    unknown  . Oxycodone-Acetaminophen Other (See Comments)    unknown  . Percocet [Oxycodone-Acetaminophen] Other (See Comments)    unknown  . Pravachol [Pravastatin Sodium] Other (See Comments)    unknown  . Trilipix [Choline Fenofibrate] Other (See Comments)    unknown  . Zetia [Ezetimibe] Other (See Comments)    unknown  . Zocor [Simvastatin] Other (See Comments)    unknown    ROS:  Out of a complete 14 system review of symptoms, the patient complains only of the following symptoms, and all other reviewed systems are negative.  Fatigue Chest pain, heart murmur, swelling in the legs Difficulty swallowing Shortness of breath Diarrhea, constipation, irritable bowel syndrome Urination problems, incontinence Anemia Joint pain, achy muscles Memory loss, confusion, weakness, dizziness, tremor Depression, anxiety  Blood pressure 119/69, pulse 68, height $RemoveBe'5\' 7"'ieAHclvVT$  (1.702 m), weight 184 lb 9.6 oz (83.734 kg).  Physical Exam  General: The patient is alert and cooperative at the time of the examination.  Eyes: Pupils are equal, round, and reactive to light. Discs are flat bilaterally.  Neck: The neck is supple, no carotid bruits are noted.  Respiratory: The respiratory examination is clear.  Cardiovascular: The cardiovascular examination reveals a regular rate  and rhythm, no obvious murmurs or rubs are noted.  Skin: Extremities are without significant edema.  Neurologic Exam  Mental status: The Mini-Mental Status Examination done today shows a total score 25/30.  Cranial nerves: Facial symmetry is present. There is good sensation of the face to pinprick and soft touch bilaterally. The strength of the facial muscles and the muscles to head turning and shoulder shrug are normal bilaterally. Speech is well enunciated, no aphasia or dysarthria is noted. Extraocular movements are full. Visual fields are full. The tongue is midline, and the patient has symmetric elevation of the soft palate. No obvious hearing deficits are noted.  Motor: The motor testing reveals 5 over 5 strength of all 4 extremities. Good symmetric motor tone is noted throughout.  Sensory: Sensory testing is intact to pinprick, soft touch, vibration sensation, and position sense on all 4 extremities, with exception that there is some slight decrease in pinprick sensation on the left arm and left leg. No evidence of extinction is noted.  Coordination: Cerebellar testing reveals good finger-nose-finger and heel-to-shin bilaterally.  Gait and station: Gait is wide-based, unsteady. Tandem gait was not attempted. The patient uses a walker for ambulation. Romberg is negative. No drift is seen.  Reflexes: Deep tendon reflexes are symmetric, but are depressed bilaterally. Toes are downgoing bilaterally.   CT head 10/27/14:  IMPRESSION: No acute intracranial abnormalities. Chronic atrophy and small vessel ischemic changes.   Assessment/Plan:  1. Memory disturbance  2. Gait disturbance  The patient is having some mild issues with memory. She has been on several medications for her low back that may impair her cognitive functioning. The patient likely does have a mild memory impairment underlying this, however. The patient has already had a CT scan of the brain, further blood work will  be done today. I have offered to start the patient on medication for memory, but she does not wish to pursue this at this time. We will follow-up in 6 months and reassess the memory at that time.   Jill Alexanders MD 12/04/2014 7:35 PM  [facility] [address] [place], [place] [address]  Phone [phone] Fax [phone]

## 2014-12-06 ENCOUNTER — Other Ambulatory Visit: Payer: Self-pay | Admitting: Radiology

## 2014-12-06 LAB — COPPER, SERUM: Copper: 134 ug/dL (ref 72–166)

## 2014-12-06 LAB — RPR: RPR: NONREACTIVE

## 2014-12-06 LAB — TSH: TSH: 3.12 u[IU]/mL (ref 0.450–4.500)

## 2014-12-07 ENCOUNTER — Ambulatory Visit (HOSPITAL_COMMUNITY)
Admission: RE | Admit: 2014-12-07 | Discharge: 2014-12-07 | Disposition: A | Discharge status: Home / Self Care | Payer: Medicare Other | Source: Ambulatory Visit | Attending: Hematology | Admitting: Hematology

## 2014-12-07 ENCOUNTER — Encounter (HOSPITAL_COMMUNITY): Payer: Self-pay

## 2014-12-07 VITALS — BP 132/58 | HR 77 | Temp 98.2°F | Resp 16

## 2014-12-07 DIAGNOSIS — D472 Monoclonal gammopathy: Secondary | ICD-10-CM

## 2014-12-07 DIAGNOSIS — D649 Anemia, unspecified: Secondary | ICD-10-CM | POA: Diagnosis present

## 2014-12-07 LAB — PROTIME-INR
INR: 1 (ref 0.00–1.49)
Prothrombin Time: 13.3 seconds (ref 11.6–15.2)

## 2014-12-07 LAB — CBC
HEMATOCRIT: 37.2 % (ref 36.0–46.0)
Hemoglobin: 11.4 g/dL — ABNORMAL LOW (ref 12.0–15.0)
MCH: 26.7 pg (ref 26.0–34.0)
MCHC: 30.6 g/dL (ref 30.0–36.0)
MCV: 87.1 fL (ref 78.0–100.0)
Platelets: 201 10*3/uL (ref 150–400)
RBC: 4.27 MIL/uL (ref 3.87–5.11)
RDW: 17.8 % — ABNORMAL HIGH (ref 11.5–15.5)
WBC: 5.5 10*3/uL (ref 4.0–10.5)

## 2014-12-07 LAB — BONE MARROW EXAM

## 2014-12-07 LAB — APTT: APTT: 27 s (ref 24–37)

## 2014-12-07 MED ORDER — MIDAZOLAM HCL 2 MG/2ML IJ SOLN
INTRAMUSCULAR | Status: AC | PRN
  Administered 2014-12-07: 2 mg via INTRAVENOUS
  Administered 2014-12-07: 1 mg via INTRAVENOUS

## 2014-12-07 MED ORDER — MIDAZOLAM HCL 2 MG/2ML IJ SOLN
INTRAMUSCULAR | Status: AC
  Filled 2014-12-07: qty 4

## 2014-12-07 MED ORDER — FENTANYL CITRATE 0.05 MG/ML IJ SOLN
INTRAMUSCULAR | Status: AC
  Filled 2014-12-07: qty 4

## 2014-12-07 MED ORDER — FENTANYL CITRATE 0.05 MG/ML IJ SOLN
INTRAMUSCULAR | Status: AC | PRN
  Administered 2014-12-07: 100 ug via INTRAVENOUS
  Administered 2014-12-07 (×2): 50 ug via INTRAVENOUS

## 2014-12-07 MED ORDER — SODIUM CHLORIDE 0.9 % IV SOLN
INTRAVENOUS | Status: DC
  Administered 2014-12-07: 10:00:00 via INTRAVENOUS

## 2014-12-07 NOTE — Procedures (Signed)
Interventional Radiology Procedure Note  Procedure: CT guided aspirate and core biopsy of right posterior iliac bone Complications: None Recommendations: - Bedrest supine x 3 hrs  - Follow biopsy results  Signed,  Dulcy Fanny. Earleen Newport, DO

## 2014-12-07 NOTE — H&P (Signed)
Chief Complaint: "I'm having a bone marrow biopsy"  Referring Physician(s): Feng,Yan  History of Present Illness: [name] [name] is a 73 y.o. female with history of moderate persistent anemia who presents today for CT guided bone marrow biopsy for further evaluation.  Past Medical History  Diagnosis Date  . Obesity   . Circadian rhythm sleep disorder   . CTS (carpal tunnel syndrome)   . Diarrhea   . IBS (irritable bowel syndrome)   . Diverticulitis of colon   . Gastritis   . Esophageal stricture   . Personal history of colonic polyps 10/25/2011 & 12/02/11    not retrieved Dr Lyla Son & tubular adenomas  . Hiatal hernia   . GERD (gastroesophageal reflux disease)   . Obstructive sleep apnea   . Insomnia   . HTN (hypertension)   . Hyperlipidemia   . Depression   . DIASTOLIC HEART FAILURE, CHRONIC 04/04/2009    Qualifier: Diagnosis of  By: Mare Ferrari, RMA, Sherri    . Memory disorder 12/04/2014    Past Surgical History  Procedure Laterality Date  . Carpal tunnel release    . Cholecystectomy  2003  . Total knee arthroplasty      bilateral  . Cataract extraction Bilateral     Allergies: Cymbalta; Morphine and related; Oxycodone-acetaminophen; Valium; Altace; Floxin; Lipitor; Lovaza; Pravachol; Trilipix; Zetia; and Zocor  Medications: Prior to Admission medications   Medication Sig Start Date End Date Taking? Authorizing Provider  amLODipine (NORVASC) 2.5 MG tablet Take 1 tablet by mouth  daily. For HTN Patient taking differently: Take 2.5 mg by mouth daily.  11/13/14  Yes [name], MD  aspirin EC 81 MG tablet Take 81 mg by mouth daily. For prophylasxis   Yes Historical Provider, MD  Calcium Carbonate-Vit D-Min (CALCIUM 1200) 1200-1000 MG-UNIT CHEW Chew 1 each by mouth daily.   Yes Historical Provider, MD  colesevelam (WELCHOL) 625 MG tablet Take 625 mg by mouth daily with breakfast.   Yes Historical Provider, MD  diphenoxylate-atropine (LOMOTIL) 2.5-0.025 MG  per tablet Take 1 tablet by mouth 4 (four) times daily as needed for diarrhea or loose stools.  08/17/14  Yes Historical Provider, MD  esomeprazole (NEXIUM) 40 MG capsule Take 1 capsule (40 mg total) by mouth 2 (two) times daily before a meal. For GERD 11/13/14  Yes [name], MD  Eszopiclone 3 MG TABS Take 1 tablet (3 mg total) by mouth at bedtime. For insomnia,take immediately before bedtime. 11/13/14  Yes [name], MD  fentaNYL (DURAGESIC - DOSED MCG/HR) 50 MCG/HR Place 1 patch (50 mcg total) onto the skin every 3 (three) days. 10/31/14  Yes [name], MD  FLUoxetine (PROZAC) 40 MG capsule Take 1 capsule (40 mg total) by mouth daily. For depression 11/13/14  Yes [name], MD  furosemide (LASIX) 20 MG tablet Take 1 tablet (20 mg total) by mouth daily. 11/13/14  Yes [name], MD  HYDROcodone-acetaminophen [facility]) 10-325 MG per tablet Take 1 tablet by mouth every 4 (four) hours as needed for moderate pain.    Yes Historical Provider, MD  iron polysaccharides (NIFEREX) 150 MG capsule Take 1 capsule (150 mg total) by mouth daily. 11/13/14  Yes [name], MD  LORazepam (ATIVAN) 0.5 MG tablet Take 1 tablet (0.5 mg total) by mouth 2 (two) times daily as needed for anxiety. 10/31/14  Yes [name], MD  meclizine (ANTIVERT) 25 MG tablet Take 1 tablet (25 mg total) by  mouth 3 (three) times daily. Patient taking differently: Take 25 mg by mouth 3 (three) times daily as needed for dizziness.  10/28/14  Yes Charlott Rakes, MD  methocarbamol (ROBAXIN) 500 MG tablet Take 500 mg by mouth every 6 (six) hours as needed for muscle spasms.    Yes Historical Provider, MD  Multiple Vitamin (MULTIVITAMIN) tablet Take 1 tablet by mouth daily. For supplement   Yes Historical Provider, MD  rosuvastatin (CRESTOR) 20 MG tablet Take 1 tablet (20 mg total) by mouth daily. Patient taking differently: Take 10-20 mg by mouth every other day. 20 mg one day alternate 10 mg the next day and repeats  11/13/14  Yes [name], MD  sulfamethoxazole-trimethoprim (BACTRIM DS,SEPTRA DS) 800-160 MG per tablet Take 1 tablet by mouth 2 (two) times daily. Patient not taking: Reported on 12/07/2014 12/03/14   [name], MD    Family History  Problem Relation Age of Onset  . Coronary artery disease Father   . Peripheral vascular disease Father   . Coronary artery disease Mother   . Coronary artery disease Brother   . Colon cancer Sister   . Emphysema Sister   . Sleep apnea Son   . Colon cancer Sister     spread to her brain  . Dementia Neg Hx   . Emphysema Brother     History   Social History  . Marital Status: Widowed    Spouse Name: N/A    Number of Children: 2  . Years of Education: HS   Occupational History  . librarian- retired     retired   Social History Main Topics  . Smoking status: Never Smoker   . Smokeless tobacco: Never Used  . Alcohol Use: Yes     Comment: very rare  . Drug Use: No  . Sexual Activity: None   Other Topics Concern  . None   Social History Narrative   Patient is right handed   Patient drinks some caffeine daily.   patlient lives alone.            Review of Systems  Constitutional: Positive for fatigue. Negative for fever and chills.  Respiratory: Positive for shortness of breath. Negative for cough.   Cardiovascular: Negative for chest pain.  Gastrointestinal: Negative for nausea, vomiting, abdominal pain and blood in stool.  Genitourinary: Negative for dysuria and hematuria.  Musculoskeletal: Positive for back pain.  Neurological: Negative for headaches.  Hematological: Does not bruise/bleed easily.    Vital Signs: Temp(Src) 98.5 F (36.9 C)  Physical Exam  Constitutional: She is oriented to person, place, and time. She appears well-developed and well-nourished.  Cardiovascular: Normal rate and regular rhythm.   Pulmonary/Chest: Effort normal and breath sounds normal.  Abdominal: Soft. Bowel sounds are normal.  There is tenderness.  Musculoskeletal: Normal range of motion. She exhibits no edema.  Neurological: She is alert and oriented to person, place, and time.    Imaging: Ct Angio Chest Pe W/cm &/or Wo Cm  11/28/2014   CLINICAL DATA:  Elevated D-dimer, chest soreness, pain with inspiration, shortness of breath, leg edema LEFT greater than RIGHT  EXAM: CT ANGIOGRAPHY CHEST WITH CONTRAST  TECHNIQUE: Multidetector CT imaging of the chest was performed using the standard protocol during bolus administration of intravenous contrast. Multiplanar CT image reconstructions and MIPs were obtained to evaluate the vascular anatomy.  CONTRAST:  100 cc Omnipaque 350 IV  COMPARISON:  03/10/2010  FINDINGS: Aorta normal caliber were without aneurysm or dissection.  Scattered atherosclerotic calcifications aorta and coronary arteries.  Extensive beam hardening artifacts from orthopedic prior hardware in the spine.  Pulmonary arteries patent.  No evidence of pulmonary embolism.  Large hiatal hernia.  No thoracic adenopathy.  Visualized portion of upper abdomen significant only for prior cholecystectomy.  Within limitations of beam hardening artifacts, no gross pulmonary infiltrate, pleural effusion or pneumothorax identified.  No acute osseous findings.  Review of the MIP images confirms the above findings.  IMPRESSION: No evidence of pulmonary embolism.  Large hiatal hernia.   Electronically Signed   By: Lavonia Dana M.D.   On: 11/28/2014 23:47    Labs:  CBC:  Recent Labs  10/27/14 0455 11/08/14 1546 11/12/14 1218 11/28/14 2106 12/07/14 0945  WBC 6.6 6.6 6.6 4.7 5.5  HGB 10.8* 11.8 12.0* 11.6* 11.4*  HCT 35.1* 38.0 38.9 36.3 37.2  PLT 235 247  --  194 201    COAGS:  Recent Labs  11/08/14 1552 12/07/14 0945  INR 1.01 1.00  APTT 28.8 27    BMP:  Recent Labs  08/21/14 1141 10/27/14 0455 11/08/14 1552 11/12/14 1155 11/28/14 1608 11/28/14 2106  NA 141 137 139 140 137 135*  K 4.8 4.6 4.3 4.7 4.5  4.0  CL 98 98  --  98 101 99  CO2 _0 GLUCOSE 103* 100* 94 102* 85 141*  BUN 12 11 10._1 CALCIUM 9.5 9.3 9.5 9.6 9.0 9.1  CREATININE 0.84 0.77 0.7 0.79 0.7 0.63  GFRNONAA 69 80*  --  73  --  86*  GFRAA 79 >90  --  85  --  >90    LIVER FUNCTION TESTS:  Recent Labs  04/24/14 1205 08/21/14 1141 08/28/14 1604 11/08/14 1552  BILITOT 0.3 0.4 0.2 0.26  AST 46* _2 ALT 20 42* 21 12  ALKPHOS 107 278* 176* 104  PROT 6.2 6.2 5.9* 6.7  ALBUMIN  --   --   --  3.7    TUMOR MARKERS: No results for input(s): AFPTM, CEA, CA199, CHROMGRNA in the last 8760 hours.  Assessment and Plan: [address] is a 73 y.o. female with history of moderate persistent anemia who presents today for CT guided bone marrow biopsy for further evaluation. Details/risks of procedure d/w pt/daughter with their understanding and consent.        Signed: Autumn Messing 12/07/2014, 10:56 AM

## 2014-12-07 NOTE — Discharge Instructions (Signed)
May remove your gauze tomorrow and shower. Call MD if red, swollen, or draining or running a fever.      Conscious Sedation Sedation is the use of medicines to promote relaxation and relieve discomfort and anxiety. Conscious sedation is a type of sedation. Under conscious sedation you are less alert than normal but are still able to respond to instructions or stimulation. Conscious sedation is used during short medical and dental procedures. It is milder than deep sedation or general anesthesia and allows you to return to your regular activities sooner.  LET [facility] CARE PROVIDER KNOW ABOUT:   Any allergies you have.  All medicines you are taking, including vitamins, herbs, eye drops, creams, and over-the-counter medicines.  Use of steroids (by mouth or creams).  Previous problems you or members of your family have had with the use of anesthetics.  Any blood disorders you have.  Previous surgeries you have had.  Medical conditions you have.  Possibility of pregnancy, if this applies.  Use of cigarettes, alcohol, or illegal drugs. RISKS AND COMPLICATIONS Generally, this is a safe procedure. However, as with any procedure, problems can occur. Possible problems include:  Oversedation.  Trouble breathing on your own. You may need to have a breathing tube until you are awake and breathing on your own.  Allergic reaction to any of the medicines used for the procedure. BEFORE THE PROCEDURE  You may have blood tests done. These tests can help show how well your kidneys and liver are working. They can also show how well your blood clots.  A physical exam will be done.  Only take medicines as directed by your health care provider. You may need to stop taking medicines (such as blood thinners, aspirin, or nonsteroidal anti-inflammatory drugs) before the procedure.   Do not eat or drink at least 6 hours before the procedure or as directed by your health care provider.  Arrange  for a responsible adult, family member, or friend to take you home after the procedure. He or she should stay with you for at least 24 hours after the procedure, until the medicine has worn off. PROCEDURE   An intravenous (IV) catheter will be inserted into one of your veins. Medicine will be able to flow directly into your body through this catheter. You may be given medicine through this tube to help prevent pain and help you relax.  The medical or dental procedure will be done. AFTER THE PROCEDURE  You will stay in a recovery area until the medicine has worn off. Your blood pressure and pulse will be checked.   Depending on the procedure you had, you may be allowed to go home when you can tolerate liquids and your pain is under control. Document Released: 09/01/2001 Document Revised: 12/12/2013 Document Reviewed: 08/14/2013 [facility] Patient Information 2015 [place], [place]. This information is not intended to replace advice given to you by your health care provider. Make sure you discuss any questions you have with your health care provider.    Bone Marrow Aspiration and Bone Biopsy Examination of the bone marrow is a valuable test to diagnose blood disorders. A bone marrow biopsy takes a sample of bone and a small amount of fluid and cells from inside the bone. A bone marrow aspiration removes only the marrow. Bone marrow aspiration and bone biopsies are used to stage different disorders of the blood, such as leukemia. Staging will help your caregiver understand how far the disease has progressed.  The tests are also  useful in diagnosing:  Fever of unknown origin (FUO).  Bacterial infections and other widespread fungal infections.  Cancers that have spread (metastasized) to the bone marrow.  Diseases that are characterized by a deficiency of an enzyme (storage diseases). This includes:  Niemann-Pick disease.  Gaucher disease. PROCEDURE  Sites used to get samples include:    Back of your hip bone (posterior iliac crest).  Both aspiration and biopsy.  Front of your hip bone (anterior iliac crest).  Both aspiration and biopsy.  Breastbone (sternum).  Aspiration from your breastbone (done only in adults). This method is rarely used. When you get a hip bone aspiration:  You are placed lying on your side with the upper knee brought up and flexed with the lower leg straight.  The site is prepared, cleaned with an antiseptic scrub, and draped. This keeps the biopsy area clean.  The skin and the area down to the lining of the bone (periosteum) are made numb with a local anesthetic.  The bone marrow aspiration needle is inserted. You will feel pressure on your bone.  Once inside the marrow cavity, a sample of bone marrow is sucked out (aspirated) for pathology slides.  The material collected for bone marrow slides is processed immediately by a technologist.  The technician selects the marrow particles to make the slides for pathology.  The marrow aspiration needle is removed. Then pressure is applied to the site with gauze until bleeding has stopped. Following an aspiration, a bone marrow biopsy may be performed as well. The technique for this is very similar. A dressing is then applied.  RISKS AND COMPLICATIONS  The main complications of a bone marrow aspiration and biopsy include infection and bleeding.  Complications are uncommon. The procedure may not be performed in patients with bleeding tendencies.  A very rare complication from the procedure is injury to the heart during a breastbone (sternal) marrow aspiration. Only bone marrow aspirations are performed in this area.  Long-lasting pain at the site of the bone marrow aspiration and biopsy is uncommon. Your caregiver will let you know when you are to get your results and will discuss them with you. You may make an appointment with your caregiver to find out the results. Do not assume everything is  normal if you have not heard from your caregiver or the medical facility. It is important for you to follow up on all of your test results. Document Released: 12/10/2004 Document Revised: 02/29/2012 Document Reviewed: 12/04/2008 [facility] Patient Information 2015 [place], [place]. This information is not intended to replace advice given to you by your health care provider. Make sure you discuss any questions you have with your health care provider.

## 2014-12-11 ENCOUNTER — Ambulatory Visit (HOSPITAL_COMMUNITY): Payer: Medicare Other | Attending: Cardiology | Admitting: Radiology

## 2014-12-11 DIAGNOSIS — R0602 Shortness of breath: Secondary | ICD-10-CM | POA: Diagnosis not present

## 2014-12-11 DIAGNOSIS — I480 Paroxysmal atrial fibrillation: Secondary | ICD-10-CM

## 2014-12-11 DIAGNOSIS — E7800 Pure hypercholesterolemia, unspecified: Secondary | ICD-10-CM

## 2014-12-11 DIAGNOSIS — E785 Hyperlipidemia, unspecified: Secondary | ICD-10-CM | POA: Insufficient documentation

## 2014-12-11 DIAGNOSIS — R0789 Other chest pain: Secondary | ICD-10-CM

## 2014-12-11 DIAGNOSIS — I10 Essential (primary) hypertension: Secondary | ICD-10-CM | POA: Diagnosis not present

## 2014-12-11 DIAGNOSIS — R079 Chest pain, unspecified: Secondary | ICD-10-CM | POA: Diagnosis present

## 2014-12-11 MED ORDER — AMINOPHYLLINE 25 MG/ML IV SOLN
75.0000 mg | Freq: Once | INTRAVENOUS | Status: AC
  Administered 2014-12-11: 75 mg via INTRAVENOUS

## 2014-12-11 MED ORDER — REGADENOSON 0.4 MG/5ML IV SOLN
0.4000 mg | Freq: Once | INTRAVENOUS | Status: AC
Start: 2014-12-11 — End: 2014-12-11
  Administered 2014-12-11: 0.4 mg via INTRAVENOUS

## 2014-12-11 MED ORDER — TECHNETIUM TC 99M SESTAMIBI GENERIC - CARDIOLITE
30.0000 | Freq: Once | INTRAVENOUS | Status: AC | PRN
  Administered 2014-12-11: 30 via INTRAVENOUS

## 2014-12-11 MED ORDER — TECHNETIUM TC 99M SESTAMIBI GENERIC - CARDIOLITE
10.0000 | Freq: Once | INTRAVENOUS | Status: AC | PRN
  Administered 2014-12-11: 10 via INTRAVENOUS

## 2014-12-11 NOTE — Progress Notes (Signed)
[facility] NUCLEAR [facility] [address] [place], [place] [address] [phone]    Cardiology Nuclear Med [name] [name] is a 73 y.o. female     MRN : [id number]     DOB: 12-Sep-1941  Procedure Date: 12/11/2014  Nuclear Med Background Indication for Stress Test:  Evaluation for Ischemia and Follow up CAD History:  CAD, '14 MPI: 68% NL Cardiac Risk Factors: Hypertension and Lipids  Symptoms:  Chest Pain and SOB   Nuclear Pre-Procedure Caffeine/Decaff Intake:  11:00pm NPO After: 11:00pm   Lungs:  clear O2 Sat: 98% on room air. IV 0.9% NS with Angio Cath:  22g  IV Site: R Hand  IV Started by:  Matilde Haymaker, RN  Chest Size (in):  44 Cup Size: D  Height: 5\' 7"  (1.702 m)  Weight:  185 lb (83.915 kg)  BMI:  Body mass index is 28.97 kg/(m^2). Tech Comments:  n/a    Nuclear Med Study 1 or 2 day study: 1 day  Stress Test Type:  Lexiscan  Reading MD: n/a  Order Authorizing Provider:  Theador Hawthorne and Kathrene Alu  Resting Radionuclide: Technetium 72m Sestamibi  Resting Radionuclide Dose: 11.0 mCi   Stress Radionuclide:  Technetium 59m Sestamibi  Stress Radionuclide Dose: 33.0 mCi           Stress Protocol Rest HR: 61 Stress HR: 91  Rest BP: 162//79 Stress BP: 153/55  Exercise Time (min): n/a METS: n/a   Predicted Max HR: 145 bpm % Max HR: 62.76 bpm Rate Pressure Product: 14742   Dose of Adenosine (mg):  n/a Dose of Lexiscan: 0.4 mg  Dose of Atropine (mg): n/a Dose of Dobutamine: n/a mcg/kg/min (at max HR)  Stress Test Technologist: Perrin Maltese, EMT-P  Nuclear Technologist:  Earl Many, CNMT     Rest Procedure:  Myocardial perfusion imaging was performed at rest 45 minutes following the intravenous administration of Technetium 29m Sestamibi. Rest ECG: NSR - Normal EKG  Stress Procedure:  The patient received IV Lexiscan 0.4 mg over 15-seconds.  Technetium 38m Sestamibi injected at 30-seconds. This patient had chest tightness and  felt weird with the Lexiscan injection. Quantitative spect images were obtained after a 45 minute delay. Stress ECG: No significant change from baseline ECG  QPS Raw Data Images:  Patient motion noted. Stress Images:  Abnormal Rest Images:  Abnormal Subtraction (SDS):  Abnormal Transient Ischemic Dilatation (Normal <1.22):  0.82  Lung/Heart ratio:0.20  Quantitative Gated Spect Images QGS EDV:  111 ml QGS ESV:  60 ml  Impression Exercise Capacity:  Lexiscan with no exercise. BP Response:  Normal blood pressure response. Clinical Symptoms:  Atypical chest pain. ECG Impression:  No significant ST segment change suggestive of ischemia. Comparison with Prior Nuclear Study: No images to compare  Overall Impression:  Intermediate risk stress nuclear study Moderate area of anteroapical and anteroseptal ischemia Also inferolateral wall infarct with moderate peri infarct ischemia  Study suggests multi vessel diseae.  LV Ejection Fraction: 57%.  LV Wall Motion:  NL LV Function; NL Wall Motion   Jenkins Rouge

## 2014-12-12 ENCOUNTER — Telehealth: Payer: Self-pay | Admitting: Nurse Practitioner

## 2014-12-12 NOTE — Telephone Encounter (Signed)
Follow up  Pt daughter wants to speak with Rn about pt stress test (12/22).  Please call daughter back on her cellphone. Not the Pt's phone. [employer]

## 2014-12-12 NOTE — Telephone Encounter (Signed)
New Msg      Pt daughter returning call.    Please contact [name] Blazina pt daughter about results of Echo.

## 2014-12-12 NOTE — Telephone Encounter (Signed)
Told pt's daughter DPR was not filled out cannot give any information

## 2014-12-12 NOTE — Telephone Encounter (Signed)
New Msg     Pt daughter returning call.     Please contact [name] Tesmer pt daughter about results of Echo.

## 2014-12-17 ENCOUNTER — Ambulatory Visit (HOSPITAL_BASED_OUTPATIENT_CLINIC_OR_DEPARTMENT_OTHER): Payer: Medicare Other

## 2014-12-17 ENCOUNTER — Ambulatory Visit (HOSPITAL_COMMUNITY)
Admission: RE | Admit: 2014-12-17 | Discharge: 2014-12-17 | Disposition: A | Discharge status: Home / Self Care | Payer: Medicare Other | Source: Ambulatory Visit | Attending: Hematology | Admitting: Hematology

## 2014-12-17 ENCOUNTER — Ambulatory Visit (HOSPITAL_COMMUNITY): Payer: Medicare Other

## 2014-12-17 ENCOUNTER — Other Ambulatory Visit: Payer: Medicare Other

## 2014-12-17 DIAGNOSIS — D649 Anemia, unspecified: Secondary | ICD-10-CM | POA: Insufficient documentation

## 2014-12-17 DIAGNOSIS — D472 Monoclonal gammopathy: Secondary | ICD-10-CM

## 2014-12-18 LAB — CHROMOSOME ANALYSIS, BONE MARROW

## 2014-12-19 LAB — KAPPA/LAMBDA LIGHT CHAINS
KAPPA LAMBDA RATIO: 1.01 (ref 0.26–1.65)
Kappa free light chain: 2.39 mg/dL — ABNORMAL HIGH (ref 0.33–1.94)
Lambda Free Lght Chn: 2.37 mg/dL (ref 0.57–2.63)

## 2014-12-19 LAB — IMMUNOFIXATION ELECTROPHORESIS
IGG (IMMUNOGLOBIN G), SERUM: 906 mg/dL (ref 690–1700)
IgA: 167 mg/dL (ref 69–380)
IgM, Serum: 115 mg/dL (ref 52–322)
TOTAL PROTEIN, SERUM ELECTROPHOR: 6.2 g/dL (ref 6.0–8.3)

## 2014-12-20 ENCOUNTER — Encounter (HOSPITAL_COMMUNITY): Payer: Self-pay | Admitting: Cardiology

## 2014-12-20 ENCOUNTER — Other Ambulatory Visit: Payer: Self-pay

## 2014-12-20 ENCOUNTER — Ambulatory Visit (HOSPITAL_COMMUNITY)
Admission: RE | Admit: 2014-12-20 | Discharge: 2014-12-20 | Disposition: A | Discharge status: Home / Self Care | Payer: Medicare Other | Source: Ambulatory Visit | Attending: Cardiology | Admitting: Cardiology

## 2014-12-20 ENCOUNTER — Encounter (HOSPITAL_COMMUNITY): Payer: Self-pay

## 2014-12-20 VITALS — BP 120/62 | HR 67 | Wt 188.1 lb

## 2014-12-20 DIAGNOSIS — E669 Obesity, unspecified: Secondary | ICD-10-CM | POA: Diagnosis not present

## 2014-12-20 DIAGNOSIS — Z7982 Long term (current) use of aspirin: Secondary | ICD-10-CM | POA: Insufficient documentation

## 2014-12-20 DIAGNOSIS — F329 Major depressive disorder, single episode, unspecified: Secondary | ICD-10-CM | POA: Insufficient documentation

## 2014-12-20 DIAGNOSIS — I2510 Atherosclerotic heart disease of native coronary artery without angina pectoris: Secondary | ICD-10-CM | POA: Insufficient documentation

## 2014-12-20 DIAGNOSIS — I480 Paroxysmal atrial fibrillation: Secondary | ICD-10-CM

## 2014-12-20 DIAGNOSIS — G4733 Obstructive sleep apnea (adult) (pediatric): Secondary | ICD-10-CM | POA: Insufficient documentation

## 2014-12-20 DIAGNOSIS — K449 Diaphragmatic hernia without obstruction or gangrene: Secondary | ICD-10-CM | POA: Diagnosis not present

## 2014-12-20 DIAGNOSIS — K219 Gastro-esophageal reflux disease without esophagitis: Secondary | ICD-10-CM | POA: Insufficient documentation

## 2014-12-20 DIAGNOSIS — I10 Essential (primary) hypertension: Secondary | ICD-10-CM | POA: Insufficient documentation

## 2014-12-20 DIAGNOSIS — E785 Hyperlipidemia, unspecified: Secondary | ICD-10-CM | POA: Insufficient documentation

## 2014-12-20 DIAGNOSIS — Z79899 Other long term (current) drug therapy: Secondary | ICD-10-CM | POA: Diagnosis not present

## 2014-12-20 DIAGNOSIS — I25118 Atherosclerotic heart disease of native coronary artery with other forms of angina pectoris: Secondary | ICD-10-CM

## 2014-12-20 NOTE — Patient Instructions (Signed)
Your physician has requested that you have a cardiac catheterization. Cardiac catheterization is used to diagnose and/or treat various heart conditions. Doctors may recommend this procedure for a number of different reasons. The most common reason is to evaluate chest pain. Chest pain can be a symptom of coronary artery disease (CAD), and cardiac catheterization can show whether plaque is narrowing or blocking your heart's arteries. This procedure is also used to evaluate the valves, as well as measure the blood flow and oxygen levels in different parts of your heart. For further information please visit [email or web address]. Please follow instruction sheet, as given.  Your physician recommends that you schedule a follow-up appointment in:  3 weeks with Dr. Aundra Dubin at [facility]

## 2014-12-22 ENCOUNTER — Emergency Department (HOSPITAL_COMMUNITY): Payer: Medicare Other

## 2014-12-22 ENCOUNTER — Encounter (HOSPITAL_COMMUNITY): Payer: Self-pay | Admitting: Vascular Surgery

## 2014-12-22 ENCOUNTER — Emergency Department (HOSPITAL_COMMUNITY)
Admission: EM | Admit: 2014-12-22 | Discharge: 2014-12-22 | Disposition: A | Discharge status: Home / Self Care | Payer: Medicare Other | Attending: Emergency Medicine | Admitting: Emergency Medicine

## 2014-12-22 DIAGNOSIS — Y9389 Activity, other specified: Secondary | ICD-10-CM | POA: Diagnosis not present

## 2014-12-22 DIAGNOSIS — E669 Obesity, unspecified: Secondary | ICD-10-CM | POA: Diagnosis not present

## 2014-12-22 DIAGNOSIS — Z79899 Other long term (current) drug therapy: Secondary | ICD-10-CM | POA: Diagnosis not present

## 2014-12-22 DIAGNOSIS — S023XXA Fracture of orbital floor, initial encounter for closed fracture: Secondary | ICD-10-CM | POA: Insufficient documentation

## 2014-12-22 DIAGNOSIS — S01511A Laceration without foreign body of lip, initial encounter: Secondary | ICD-10-CM | POA: Insufficient documentation

## 2014-12-22 DIAGNOSIS — I10 Essential (primary) hypertension: Secondary | ICD-10-CM | POA: Insufficient documentation

## 2014-12-22 DIAGNOSIS — K2970 Gastritis, unspecified, without bleeding: Secondary | ICD-10-CM | POA: Diagnosis not present

## 2014-12-22 DIAGNOSIS — Y9259 Other trade areas as the place of occurrence of the external cause: Secondary | ICD-10-CM | POA: Diagnosis not present

## 2014-12-22 DIAGNOSIS — Y998 Other external cause status: Secondary | ICD-10-CM | POA: Diagnosis not present

## 2014-12-22 DIAGNOSIS — I2510 Atherosclerotic heart disease of native coronary artery without angina pectoris: Secondary | ICD-10-CM | POA: Insufficient documentation

## 2014-12-22 DIAGNOSIS — G4700 Insomnia, unspecified: Secondary | ICD-10-CM | POA: Diagnosis not present

## 2014-12-22 DIAGNOSIS — Z7982 Long term (current) use of aspirin: Secondary | ICD-10-CM | POA: Diagnosis not present

## 2014-12-22 DIAGNOSIS — F329 Major depressive disorder, single episode, unspecified: Secondary | ICD-10-CM | POA: Insufficient documentation

## 2014-12-22 DIAGNOSIS — W109XXA Fall (on) (from) unspecified stairs and steps, initial encounter: Secondary | ICD-10-CM | POA: Diagnosis not present

## 2014-12-22 DIAGNOSIS — I5030 Unspecified diastolic (congestive) heart failure: Secondary | ICD-10-CM | POA: Insufficient documentation

## 2014-12-22 DIAGNOSIS — I4821 Permanent atrial fibrillation: Secondary | ICD-10-CM | POA: Insufficient documentation

## 2014-12-22 DIAGNOSIS — W228XXA Striking against or struck by other objects, initial encounter: Secondary | ICD-10-CM | POA: Insufficient documentation

## 2014-12-22 DIAGNOSIS — S299XXA Unspecified injury of thorax, initial encounter: Secondary | ICD-10-CM | POA: Diagnosis present

## 2014-12-22 DIAGNOSIS — G8929 Other chronic pain: Secondary | ICD-10-CM | POA: Insufficient documentation

## 2014-12-22 DIAGNOSIS — Z86010 Personal history of colonic polyps: Secondary | ICD-10-CM | POA: Diagnosis not present

## 2014-12-22 DIAGNOSIS — K219 Gastro-esophageal reflux disease without esophagitis: Secondary | ICD-10-CM | POA: Diagnosis not present

## 2014-12-22 DIAGNOSIS — W19XXXA Unspecified fall, initial encounter: Secondary | ICD-10-CM

## 2014-12-22 DIAGNOSIS — T1490XA Injury, unspecified, initial encounter: Secondary | ICD-10-CM

## 2014-12-22 LAB — COMPREHENSIVE METABOLIC PANEL
ALK PHOS: 113 U/L (ref 39–117)
ALT: 24 U/L (ref 0–35)
ANION GAP: 10 (ref 5–15)
AST: 30 U/L (ref 0–37)
Albumin: 3.5 g/dL (ref 3.5–5.2)
BILIRUBIN TOTAL: 0.4 mg/dL (ref 0.3–1.2)
BUN: 14 mg/dL (ref 6–23)
CO2: 27 mmol/L (ref 19–32)
CREATININE: 0.74 mg/dL (ref 0.50–1.10)
Calcium: 9.2 mg/dL (ref 8.4–10.5)
Chloride: 103 mEq/L (ref 96–112)
GFR calc Af Amer: >90 mL/min (ref >90)
GFR, EST NON AFRICAN AMERICAN: 81 mL/min — AB (ref >90)
GLUCOSE: 104 mg/dL — AB (ref 70–99)
Potassium: 3.7 mmol/L (ref 3.5–5.1)
Sodium: 140 mmol/L (ref 135–145)
Total Protein: 6.3 g/dL (ref 6.0–8.3)

## 2014-12-22 LAB — CBC WITH DIFFERENTIAL/PLATELET
Basophils Absolute: 0 10*3/uL (ref 0.0–0.1)
Basophils Relative: 0 % (ref 0–1)
EOS ABS: 0.1 10*3/uL (ref 0.0–0.7)
Eosinophils Relative: 1 % (ref 0–5)
HCT: 39.5 % (ref 36.0–46.0)
HEMOGLOBIN: 12.5 g/dL (ref 12.0–15.0)
Lymphocytes Relative: 14 % (ref 12–46)
Lymphs Abs: 1.1 10*3/uL (ref 0.7–4.0)
MCH: 27.6 pg (ref 26.0–34.0)
MCHC: 31.6 g/dL (ref 30.0–36.0)
MCV: 87.2 fL (ref 78.0–100.0)
MONO ABS: 0.6 10*3/uL (ref 0.1–1.0)
Monocytes Relative: 8 % (ref 3–12)
NEUTROS ABS: 5.7 10*3/uL (ref 1.7–7.7)
Neutrophils Relative %: 77 % (ref 43–77)
Platelets: 183 10*3/uL (ref 150–400)
RBC: 4.53 MIL/uL (ref 3.87–5.11)
RDW: 18.7 % — ABNORMAL HIGH (ref 11.5–15.5)
WBC: 7.4 10*3/uL (ref 4.0–10.5)

## 2014-12-22 MED ORDER — AMOXICILLIN-POT CLAVULANATE 875-125 MG PO TABS: 1.0000 | ORAL_TABLET | Freq: Two times a day (BID) | ORAL | Status: DC

## 2014-12-22 MED ORDER — HYDROMORPHONE HCL 1 MG/ML IJ SOLN
1.0000 mg | Freq: Once | INTRAMUSCULAR | Status: AC
  Administered 2014-12-22: 1 mg via INTRAVENOUS
  Filled 2014-12-22: qty 1

## 2014-12-22 MED ORDER — ONDANSETRON HCL 4 MG/2ML IJ SOLN
4.0000 mg | Freq: Once | INTRAMUSCULAR | Status: AC
  Administered 2014-12-22: 4 mg via INTRAVENOUS
  Filled 2014-12-22: qty 2

## 2014-12-22 NOTE — Discharge Instructions (Signed)
Take augmentin twice a day for a week.   Increase your hydrocodone for pain.   Follow up with ENT and your spine doctor.   You will likely have more bruising.   Return to ER if you have severe pain, vomiting, neck pain, passing out.

## 2014-12-22 NOTE — ED Notes (Signed)
Patient returned from Radiology. 
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
## 2014-12-22 NOTE — Progress Notes (Signed)
Patient ID: [name] [name], female   DOB: [birth year], 74 y.o.   MRN: [id number] PCP: Laurance Flatten  74 yo with history of HTN, hyperlipidemia, and nonobstructive CAD presents for cardiology followup.  Prior to a back surgery, she had Lexiscan Cardiolite in 11/14 with no ischemia or infarction.  After her first operation, she remained in considerable pain.  She had repeat operation in 9/15 (spinal fusion T3-T12).  Post-op, she had atrial fibrillation.  She had DCCV to NSR but was not sent home on anticoagulation.  She remains in NSR. Her post-operative course was difficult.  She was orthostatic with multiple falls as well as anemia and confusion.  She was kept off anticoagulation because of this.  Confusion has improved.  She is still mildly lightheaded with standing but no falls over the last few weeks.    In 12/15, she was seen by Truitt Merle with chest pain and dyspnea.  Lexiscan Cardiolite was done in 12/15 showing normal EF but areas of ischemia concerning for multivessel disease. She has episodes of chest soreness that occur daily.  Not related to exertion.  She also has pain in her back related to recent surgery.    Labs (9/14) K 4.4, creatinine 0.81, LDL particle number 1279, LDL 69 Labs (5/15): K 4.5, creatinine 0.8, LDL-P 745, LDL 38 Labs (12/15): K 4, creatinine 0.63, HCT 37.2, BNP 366  PMH: 1. Chronic low back pain: spinal stenosis.  Spinal fusion with rods in 2/15 at [facility].  Repeat spinal fusion 9/15.  2. Obesity 3. H/o diverticulitis 4. IBS 5. H/o colon polyps 6. GERD: h/o hiatal hernia 7. OSA 8. HTN 9. Hyperlipidemia 10. Depression 11. Cholecystectomy 12. H/o bilateral TKR 13. CAD: LHC (5/09) with 50% ostial LCx, 30-40% mRCA, 30% mLAD.  Lexiscan Cardiolite in 11/14 with EF 68%, no ischemia or infarction. Lexiscan Cardiolite (12/15) with EF 57%, moderate anteroapical and anteroseptal ischemia, inferolateral infarct with peri-infarct ischemia => suggestive of multivessel disease.   14. PNA in 3/15 15. Atrial fibrillation: Paroxysmal.  Has only been noted post-op 9/15 spinal fusion.  She had cardioversion, not on anticoagulation.  16. MGUS: Negative bone marrow biopsy 12/15.  17. Anemia  SH: Widow, 2 children, retired Licensed conveyancer, nonsmoker.    FH: CAD in father, mother, brother.    ROS: All systems reviewed and negative except as per HPI.   Current Outpatient Prescriptions  Medication Sig Dispense Refill  . amLODipine (NORVASC) 2.5 MG tablet Take 1 tablet by mouth  daily. For HTN (Patient taking differently: Take 2.5 mg by mouth daily. ) 90 tablet 3  . aspirin EC 81 MG tablet Take 81 mg by mouth daily. For prophylasxis    . Calcium Carbonate-Vit D-Min (CALCIUM 1200) 1200-1000 MG-UNIT CHEW Chew 1 each by mouth daily.    . colesevelam (WELCHOL) 625 MG tablet Take 625 mg by mouth daily with breakfast.    . diphenoxylate-atropine (LOMOTIL) 2.5-0.025 MG per tablet Take 1 tablet by mouth 4 (four) times daily as needed for diarrhea or loose stools.     Marland Kitchen esomeprazole (NEXIUM) 40 MG capsule Take 1 capsule (40 mg total) by mouth 2 (two) times daily before a meal. For GERD 180 capsule 3  . Eszopiclone 3 MG TABS Take 1 tablet (3 mg total) by mouth at bedtime. For insomnia,take immediately before bedtime. 90 tablet 1  . fentaNYL (DURAGESIC - DOSED MCG/HR) 50 MCG/HR Place 1 patch (50 mcg total) onto the skin every 3 (three) days. 10 patch 0  . FLUoxetine (  PROZAC) 40 MG capsule Take 1 capsule (40 mg total) by mouth daily. For depression 90 capsule 3  . furosemide (LASIX) 20 MG tablet Take 1 tablet (20 mg total) by mouth daily. 90 tablet 3  . HYDROcodone-acetaminophen (NORCO) 10-325 MG per tablet Take 1 tablet by mouth every 4 (four) hours as needed for moderate pain.     . iron polysaccharides (NIFEREX) 150 MG capsule Take 1 capsule (150 mg total) by mouth daily. 90 capsule 3  . LORazepam (ATIVAN) 0.5 MG tablet Take 1 tablet (0.5 mg total) by mouth 2 (two) times daily as needed for  anxiety. 60 tablet 1  . meclizine (ANTIVERT) 25 MG tablet Take 1 tablet (25 mg total) by mouth 3 (three) times daily. (Patient taking differently: Take 25 mg by mouth 3 (three) times daily as needed for dizziness. ) 30 tablet 0  . methocarbamol (ROBAXIN) 500 MG tablet Take 500 mg by mouth every 6 (six) hours as needed for muscle spasms.     . Multiple Vitamin (MULTIVITAMIN) tablet Take 1 tablet by mouth daily. For supplement    . rosuvastatin (CRESTOR) 20 MG tablet Take 1 tablet (20 mg total) by mouth daily. (Patient taking differently: Take 10-20 mg by mouth every other day. 20 mg one day alternate 10 mg the next day and repeats) 90 tablet 3  . amoxicillin-clavulanate (AUGMENTIN) 875-125 MG per tablet Take 1 tablet by mouth every 12 (twelve) hours. 14 tablet 0   No current facility-administered medications for this encounter.   BP 120/62 mmHg  Pulse 67  Wt 188 lb 1.9 oz (85.331 kg)  SpO2 94% General: NAD Neck: No JVD, no thyromegaly or thyroid nodule.  Lungs: Clear to auscultation bilaterally with normal respiratory effort. CV: Nondisplaced PMI.  Heart regular S1/S2, no S3/S4, no murmur.  No peripheral edema.  No carotid bruit.  Normal pedal pulses.  Abdomen: Soft, nontender, no hepatosplenomegaly, no distention.  Skin: Intact without lesions or rashes.  Neurologic: Alert and oriented x 3.  Psych: Normal affect.  Assessment/Plan: 1. CAD: Atypical chest pain but recent abnormal Cardiolite concerning for multivessel disease. - Continue ASA 81 and statin.  - I will arrange for left heart cath next week.  2. Hyperlipidemia: Excellent LDL and improved LDL particle number on lipid profile in 5/15.  Continue current Crestor dosing.  3. HTN: BP controlled on current medications.    4. Atrial fibrillation: Paroxysmal.  Has only occurred post-op spinal fusion.  She was not anticoagulated given frequent falls.  I will leave her off for now but if she has recurrent atrial fibrillation, would start  anticoagulation as she is now less orthostatic.    Dalton McLean 12/22/2014  

## 2014-12-22 NOTE — ED Notes (Signed)
Patient transported to CT 

## 2014-12-22 NOTE — ED Notes (Signed)
Pt reports to the ED for eval of facial pain following a fall. Pt reports she was walking down the one step in her garage and she fell and struck her face and chin. Laceration noted not lip and swelling noted to her jaw, left periorbital swelling, and left cheek swelling. Pt denies any LOC or blood thinner use. Pt reports chronic back pain and a HA as well. Pt anxious and tearful at this time. She also reports she is supposed to get a cardiac cath sometime this week. 12 lead unremarkable en route. Pt A&Ox4, resp e/u, and skin warm and dry.

## 2014-12-24 ENCOUNTER — Telehealth: Payer: Self-pay | Admitting: Hematology

## 2014-12-24 ENCOUNTER — Ambulatory Visit (HOSPITAL_BASED_OUTPATIENT_CLINIC_OR_DEPARTMENT_OTHER): Payer: Medicare Other | Admitting: Hematology

## 2014-12-24 ENCOUNTER — Telehealth: Payer: Self-pay | Admitting: Cardiology

## 2014-12-24 ENCOUNTER — Encounter: Payer: Self-pay | Admitting: Hematology

## 2014-12-24 VITALS — BP 127/49 | HR 56 | Temp 98.2°F | Resp 18 | Ht 67.0 in | Wt 189.0 lb

## 2014-12-24 DIAGNOSIS — F411 Generalized anxiety disorder: Secondary | ICD-10-CM

## 2014-12-24 DIAGNOSIS — I10 Essential (primary) hypertension: Secondary | ICD-10-CM

## 2014-12-24 DIAGNOSIS — I4891 Unspecified atrial fibrillation: Secondary | ICD-10-CM

## 2014-12-24 DIAGNOSIS — E785 Hyperlipidemia, unspecified: Secondary | ICD-10-CM

## 2014-12-24 DIAGNOSIS — D649 Anemia, unspecified: Secondary | ICD-10-CM

## 2014-12-24 DIAGNOSIS — D472 Monoclonal gammopathy: Secondary | ICD-10-CM

## 2014-12-24 DIAGNOSIS — R410 Disorientation, unspecified: Secondary | ICD-10-CM

## 2014-12-24 NOTE — Telephone Encounter (Signed)
Pt's daughter aware.

## 2014-12-24 NOTE — Telephone Encounter (Signed)
New message    Daughter calling questions regarding upcoming heart cath . Due to facial fracture under eye.

## 2014-12-24 NOTE — Telephone Encounter (Signed)
OK to go ahead with cath as long as she is not actively bleeding the day of procedure.

## 2014-12-24 NOTE — Progress Notes (Signed)
[facility] CONSULT NOTE  Patient Care Team: [name], MD as PCP - General Cindee Salt, MD (Physical Medicine and Rehabilitation) Hillary Bow, MD (Cardiology) Melina Schools, MD (Obstetrics and Gynecology)  CHIEF COMPLAINTS Anemia   INITIAL PRESENTATION (first consult):  [name] [name] 74 y.o. female is being referred by Dr. Deatra Ina because of anemia.   She had lumbar and thoracic spine fusion surgery for kyphosis in Feb 2015 and Sep 2015, and required blood transfusion 2u each time after surgery. She was discharged to rehab after surgery, and she received 2u RBC in Nov 4th, 2015 for Hb 7.8g/dl. She was fatigued when her Hg was low, but still able to do some rehab. She had multiple full in the past few month and was hospitalized last month. She still has diffuse body pain from the recent fall, quite fatigued, is only able to walk a short distance, (+) dyspnea and chest heaviness on exertion. No nausea, no change of her bowel habits. Her appetite is moderate to low, she lost about 15-20lbs in the past year.  She denied any bleeding episodes including hematochezia, melana, hemoptysis, hematuria or epitaxis. No mucosal bleeding or easy bruising. She was finally discharged home on 10/30/14, still getting home PT/OT. She came in today with a wheelchair.   Per her daughter, she has had intermittent confusion and anxiety lately, she seems to have some cognitive function dificiency also. Pt her self contributes to some of her medications.   She has last colonoscopy about 3 years ago, she probably had polyps removed. Her last mammogram was one year ago which was normal. Per her daughter, she has a normal CT abdomen and pelvis about 5-6 month ago which was ordered for some abdominal symptoms. She has been on iron pill (1 daily) a few weeks ago.    INTERVAL HISTORY  She returns for follow up. She had a mechanicall fall 2 days ago at home, she was seen by ED, and CT head  showed a acute left orbital floor fracture. She still has some bruise on left base, neck and around left eye. She had a abnormal stress test lately, will have more work up. Her confusion has improved lately, per her daughter. She otherwise feels well, eats well. She walks better, tries to wean off her walker, she also has a cane.  MEDICAL HISTORY:  Past Medical History  Diagnosis Date  . Obesity   . Atrial fibrillation    . CTS (carpal tunnel syndrome)   . Diarrhea   . IBS (irritable bowel syndrome)   . Diverticulitis of colon   . Gastritis   . Esophageal stricture, s/p dilation    . Personal history of colonic polyps 10/25/2011 & 12/02/11    not retrieved Dr Lyla Son & tubular adenomas  . Hiatal hernia   . GERD (gastroesophageal reflux disease)   . Obstructive sleep apnea   . Insomnia   . HTN (hypertension)   . Hyperlipidemia   . Depression/anxiety    . DIASTOLIC HEART FAILURE, CHRONIC 04/04/2009    Qualifier: Diagnosis of  By: Mare Ferrari, RMA, Sherri      SURGICAL HISTORY: Past Surgical History  Procedure Laterality Date  . Carpal tunnel release    . Cholecystectomy  2003  . Total knee arthroplasty      bilateral  . Cataract extraction Bilateral     SOCIAL HISTORY: History   Social History  . Marital Status: Widowed    Spouse Name: N/A  Number of Children: 2  . Years of Education: HS   Occupational History  . librarian- retired     retired   Social History Main Topics  . Smoking status: Never Smoker   . Smokeless tobacco: Never Used  . Alcohol Use: Yes     Comment: very rare  . Drug Use: No  . Sexual Activity: Not on file   Other Topics Concern  . Not on file   Social History Narrative   Patient is right handed   Patient drinks some caffeine daily.   patlient lives alone.       FAMILY HISTORY: Family History  Problem Relation Age of Onset  . Coronary artery disease Father   . Peripheral vascular disease Father   . Coronary artery disease  Mother   . Coronary artery disease Brother   . Colon cancer Sister at 69   . Emphysema Sister   . Sleep apnea Son   . Colon cancer Sister at 33     spread to her brain  No family history of blood disorders or other malignancy   ALLERGIES:  is allergic to cymbalta; morphine and related; oxycodone-acetaminophen; valium; altace; floxin; lipitor; lovaza; pravachol; trilipix; zetia; and zocor.  MEDICATIONS:  Current Outpatient Prescriptions  Medication Sig Dispense Refill  . amLODipine (NORVASC) 2.5 MG tablet Take 1 tablet by mouth  daily. For HTN (Patient taking differently: Take 2.5 mg by mouth daily. ) 90 tablet 3  . amoxicillin-clavulanate (AUGMENTIN) 875-125 MG per tablet Take 1 tablet by mouth every 12 (twelve) hours. 14 tablet 0  . aspirin EC 81 MG tablet Take 81 mg by mouth daily. For prophylasxis    . Calcium Carbonate-Vit D-Min (CALCIUM 1200) 1200-1000 MG-UNIT CHEW Chew 1 each by mouth daily.    . colesevelam (WELCHOL) 625 MG tablet Take 625 mg by mouth daily with breakfast.    . diphenoxylate-atropine (LOMOTIL) 2.5-0.025 MG per tablet Take 1 tablet by mouth 4 (four) times daily as needed for diarrhea or loose stools.     Marland Kitchen esomeprazole (NEXIUM) 40 MG capsule Take 1 capsule (40 mg total) by mouth 2 (two) times daily before a meal. For GERD 180 capsule 3  . Eszopiclone 3 MG TABS Take 1 tablet (3 mg total) by mouth at bedtime. For insomnia,take immediately before bedtime. 90 tablet 1  . fentaNYL (DURAGESIC - DOSED MCG/HR) 50 MCG/HR Place 1 patch (50 mcg total) onto the skin every 3 (three) days. 10 patch 0  . FLUoxetine (PROZAC) 40 MG capsule Take 1 capsule (40 mg total) by mouth daily. For depression 90 capsule 3  . furosemide (LASIX) 20 MG tablet Take 1 tablet (20 mg total) by mouth daily. 90 tablet 3  . HYDROcodone-acetaminophen (NORCO) 10-325 MG per tablet Take 1 tablet by mouth every 4 (four) hours as needed for moderate pain.     . iron polysaccharides (NIFEREX) 150 MG capsule Take  1 capsule (150 mg total) by mouth daily. 90 capsule 3  . LORazepam (ATIVAN) 0.5 MG tablet Take 1 tablet (0.5 mg total) by mouth 2 (two) times daily as needed for anxiety. 60 tablet 1  . meclizine (ANTIVERT) 25 MG tablet Take 1 tablet (25 mg total) by mouth 3 (three) times daily. (Patient taking differently: Take 25 mg by mouth 3 (three) times daily as needed for dizziness. ) 30 tablet 0  . methocarbamol (ROBAXIN) 500 MG tablet Take 500 mg by mouth every 6 (six) hours as needed for muscle spasms.     Marland Kitchen  Multiple Vitamin (MULTIVITAMIN) tablet Take 1 tablet by mouth daily. For supplement    . rosuvastatin (CRESTOR) 20 MG tablet Take 1 tablet (20 mg total) by mouth daily. (Patient taking differently: Take 10-20 mg by mouth every other day. 20 mg one day alternate 10 mg the next day and repeats) 90 tablet 3   No current facility-administered medications for this visit.    REVIEW OF SYSTEMS:   Constitutional: Denies fevers, chills or abnormal night sweats Eyes: Denies blurriness of vision, double vision or watery eyes Ears, nose, mouth, throat, and face: Denies mucositis or sore throat Respiratory: Denies cough, dyspnea or wheezes Cardiovascular: Denies palpitation, chest discomfort or lower extremity swelling Gastrointestinal:  Denies nausea, heartburn or change in bowel habits Skin: Denies abnormal skin rashes Lymphatics: Denies new lymphadenopathy or easy bruising Neurological:Denies numbness, tingling or new weaknesses, (+) intermittent confusion  Behavioral/Psych: (+) anxiety, no new changes  All other systems were reviewed with the patient and are negative.  PHYSICAL EXAMINATION:  Filed Vitals:   12/24/14 1435  BP: 127/49  Pulse: 56  Temp: 98.2 F (36.8 C)  Resp: 18   Filed Weights   12/24/14 1435  Weight: 189 lb (85.73 kg)    GENERAL:alert, no distress and comfortable, sitting in wheelchair  SKIN: skin color, texture, turgor are normal, no rashes or significant lesions EYES:  normal, conjunctiva are pink and non-injected, sclera clear OROPHARYNX:no exudate, no erythema and lips, buccal mucosa, and tongue normal  NECK: supple, thyroid normal size, non-tender, without nodularity LYMPH:  no palpable lymphadenopathy in the cervical, axillary or inguinal LUNGS: clear to auscultation and percussion with normal breathing effort HEART: regular rate & rhythm and no murmurs and no lower extremity edema ABDOMEN:abdomen soft, non-tender and normal bowel sounds Musculoskeletal:no cyanosis of digits and no clubbing. (+) surgical scar at her thoracic and lumbar spine  PSYCH: alert & oriented x 3 with fluent speech, but argues with her daughter a lot  NEURO: no focal motor/sensory deficits  LABORATORY DATA:  I have reviewed the data as listed CBC Latest Ref Rng 12/22/2014 12/07/2014 11/28/2014  WBC 4.0 - 10.5 K/uL 7.4 5.5 4.7  Hemoglobin 12.0 - 15.0 g/dL 12.5 11.4(L) 11.6(L)  Hematocrit 36.0 - 46.0 % 39.5 37.2 36.3  Platelets 150 - 400 K/uL 183 201 194      Recent Labs  04/24/14 1205  08/21/14 1141 08/28/14 1604  11/08/14 1552 11/12/14 1155 11/28/14 1608 11/28/14 2106 12/22/14 1340  NA 138  < > 141  --   < > 139 140 137 135* 140  K 4.5  < > 4.8  --   < > 4.3 4.7 4.5 4.0 3.7  CL 97  < > 98  --   < >  --  98 101 99 103  CO2 25  < > 29  --   < > _0 GLUCOSE 95  < > 103*  --   < > 94 102* 85 141* 104*  BUN 14  < > 12  --   < > 10._1 CREATININE 0.80  < > 0.84  --   < > 0.7 0.79 0.7 0.63 0.74  CALCIUM 9.3  < > 9.5  --   < > 9.5 9.6 9.0 9.1 9.2  GFRNONAA 73  < > 69  --   < >  --  73  --  86* 81*  GFRAA 84  < > 79  --   < >  --  85  --  >90 >90  PROT 6.2  --  6.2 5.9*  --  6.7  --   --   --  6.3  ALBUMIN  --   --   --   --   --  3.7  --   --   --  3.5  AST 46*  --  27 22  --  20  --   --   --  30  ALT 20  --  42* 21  --  12  --   --   --  24  ALKPHOS 107  --  278* 176*  --  104  --   --   --  113  BILITOT 0.3  --  0.4 0.2  --  0.26  --   --    --  0.4  BILIDIR 0.09  --  0.16 0.07  --   --   --   --   --   --   < > = values in this interval not displayed.  Protein electrophoresis, serum  Status: Finalresult Visible to patient:  MyChart Nextappt: 11/28/2014 at 02:30 PM in Cardiology Truitt Merle, NP)            Ref Range 2wk ago    Total Protein, Serum Electrophoresis 6.0 - 8.3 g/dL 6.6   Albumin ELP 55.8 - 66.1 % 54.8 (L)   Alpha-1-Globulin 2.9 - 4.9 % 5.3 (H)   Alpha-2-Globulin 7.1 - 11.8 % 13.5 (H)   Beta Globulin 4.7 - 7.2 % 6.5   Beta 2 3.2 - 6.5 % 5.2   Gamma Globulin 11.1 - 18.8 % 14.7   M-Spike, % g/dL 0.26      IFE with QIG (immunofixation electrophoresis)  Status: Finalresult Visible to patient:  Not Released Nextappt: 01/03/2015 at 11:30 AM in [place] (Laurance Flatten, Elenore Rota, MD) Dx:  MGUS (monoclonal gammopathy of unknow...           Ref Range 7d ago  37moago  373yrgo     Total Protein, Serum Electrophoresis 6.0 - 8.3 g/dL 6.2 6.6     IgG (Immunoglobin G), Serum 690 - 1700 mg/dL 906      IgA 69 - 380 mg/dL 167  141    IgM, Serum 52 - 322 mg/dL 115      Immunofix Electr Int  *       Comments: Monoclonal IgG lambda protein is present.Reviewed by MaFrancis Gainesammarappallil MD (Electronic Signature on File)   Vitamin B12 (Order [id number]     Vitamin B12  Status: Finalresult Visible to patient:  MyChart Nextappt: 11/28/2014 at 02:30 PM in [facility])           Ref Range 2wk ago (11/08/14) 4y80yro (10/28/10) 42yr70yr (04/08/10) 11yr 2yr(11/13/08)    Vitamin B-12 211 - 911 pg/mL 861 504R 591R 559R      Ferritin  Status: Finalresult Visible to patient:  MyChart Nextappt: 11/28/2014 at 02:30 PM in Cardiology (LORITruitt Merle Dx:  Anemia, unspecified anemia type           Ref Range 2wk ago (11/08/14) 40yr a41yr11/8/11) 42yr ag68yr/19/11) 11yr ago37yr/24/09)    Ferritin 9 - 269 ng/ml 107  14.3R 17.9R 7.8 (L)R      Iron and TIBC [facility]  Status: Finalresult Visible to patient:  MyChart Nextappt: 11/28/2014 at 02:30 PM in Cardiology (LORI GETruitt Merle:  Anemia, unspecified anemia type  Ref Range 2wk ago (11/08/14) 86yrago (10/28/10) 480yrgo (04/08/10) 6y9yro (11/13/08)    Iron 41 - 142 ug/dL 44 51R 76R 42R    TIBC 236 - 444 ug/dL 324       UIBC 120 - 384 ug/dL 280       %SAT 21 - 57 % 13 (L)         Soluble transferrin receptor (Order [id number]    Soluble transferrin receptor  Status: Finalresult Visible to patient:  MyChart Nextappt: 11/28/2014 at 02:30 PM in Cardiology (LOTruitt MerleP)            Ref Range 2wk ago    Transferrin Receptor, Soluble 0.76 - 1.76 mg/L 2.57 (H)       Erythropoietin  Status: Finalresult Visible to patient:  MyChart Nextappt: 11/28/2014 at 02:30 PM in Cardiology (LOTruitt MerleP) Dx:  Anemia, unspecified anemia type         Ref Range 2wk ago    Erythropoietin 2.6 - 18.5 mIU/mL 13.5      Morphology  Status: Finalresult Visible to patient:  MyChart Nextappt: 11/28/2014 at 02:30 PM in Cardiology (LOTruitt MerleP) Dx:  Anemia, unspecified anemia type         Ref Range 2wk ago    Polychromasia Slight  Slight   Tear Drop Cells Negative  Few   White Cell Comments  Variant Lymphs   PLT EST Adequate  Adequate   Platelet Morphology Within Normal Limits  Large Platelets      SURGICAL PATH 12/07/14 Bone Marrow, Aspirate,Biopsy, and Clot, right post iliac - HYPERCELLULAR BONE MARROW FOR AGE WITH TRILINEAGE HEMATOPOIESIS AND 5% PLASMA CELLS. - SEE COMMENT. PERIPHERAL BLOOD: - NORMOCYTIC-NORMOCHROMIC ANEMIA. Diagnosis Note The bone marrow is hypercellular for age with trilineage hematopoiesis and essentially orderly and progressive maturation of all myeloid cell types. The plasma cells represent 5% of all cells  associated with occasional small clusters. Immunohistochemical stains apparently show polyclonal staining pattern in plasma cells for kappa and lambda light chains. The appearance is non specific and not diagnostic of plasma cell dyscrasia/neoplasm. Correlation   RADIOGRAPHIC STUDIES: I have personally reviewed the radiological images as listed and agreed with the findings in the report. Ct Head Wo Contrast 10/27/2014   IMPRESSION: No acute intracranial abnormalities. Chronic atrophy and small vessel ischemic changes.     Ct Cervical Spine Wo Contrast 10/27/2014 IMPRESSION:  1. No evidence of acute fracture or malalignment.  2. Multilevel cervical spondylosis and facet arthropathy without focality.  3. Atherosclerotic vascular calcifications in both carotid arteries.      ASSESSMENT & PLAN:  [name] [name] is a 73 91ar old female with multiple comorbidities, who presents with moderate persistent anemia after her spinal fusion surgery which required blood transfusion 3 times.  1. Normocytic hypoproductive anemia, mild anemia of iron deficiency, rule out MM  -I reviewed her the above lab result with her and  Her daughter. No lab evidence of megaloblastic anemia, or hemolysis.   -She has normal iron level and ferritin, but low iron saturation and elevated soluble transferrin receptor, and history of very low ferritin in the past (<10), which support IDA. Her bone marrow biopsy also showed low iron staining. -Anemia has spontaneously resolved. I suggest her to take by mouth iron 2 tablets a day. -Follow-up her CBC.   2. MGUS, IgG  -SPEP showed low level M-protein, immunofixation was consistent with IgG lambda protein.  Quantitative immunoglobulin level and light chain levels were grossly normal. -  I reviewed her bone marrow biopsy results, which showed 5% plasma cell, no monoclonal light chain deposition. -she likely has MGUS. No evidence of multiple myeloma. -Follow up.  3. Confusion,  anxiety -she is scheduled to see neurology  -Improved lately.  4. HTN, AF, dyslipidemia  -follow up with PCP   Plan -I will call you for the bone marrow cytogenetics test result  -Return to clinic in 6 months with repeated CBC and CMP, ferritin, iron level and TIBC, SPEP/UPEP with immunofixation.  All questions were answered. The patient knows to call the clinic with any problems, questions or concerns.  I spent 20 minutes counseling the patient face to face. The total time spent in the appointment was 30 minutes and more than 50% was on counseling.     Orders Placed This Encounter  Procedures  . Comprehensive metabolic panel (Cmet) - [facility]    Standing Status: Future     Number of Occurrences:      Standing Expiration Date: 12/25/2015  . Ferritin    Standing Status: Future     Number of Occurrences:      Standing Expiration Date: 12/25/2015  . Iron and TIBC [facility]    Standing Status: Future     Number of Occurrences:      Standing Expiration Date: 12/25/2015  . SPEP & IFE with QIG    Standing Status: Future     Number of Occurrences:      Standing Expiration Date: 12/25/2015  . IFE, Urine (with Tot Prot)    Standing Status: Future     Number of Occurrences:      Standing Expiration Date: 12/25/2015       Truitt Merle, MD 12/24/2014 11:43 PM

## 2014-12-24 NOTE — Telephone Encounter (Signed)
Patient had a fall down her garage steps on Saturday 12/22/14 Has orbital fracture with a lot of bruising. Wanted to make sure this will not interfere with heart cath scheduled 12/27/14. Patient and daughter want to proceed as scheduled but want to make sure Dr. Aundra Dubin will be in agreement.  Advised it will not likely be a reason to reschedule the cath. Aware that I will forward to Dr. Aundra Dubin and his primary nurse.  Daughter states only need to call back if theres a concern or need to reschedule.

## 2014-12-24 NOTE — Telephone Encounter (Signed)
gv and printed appt sched and avs fo rpt for July °

## 2014-12-25 LAB — UPEP/TP, 24-HR URINE
COLLECTION INTERVAL: 24 h
TOTAL PROTEIN, URINE/DAY: 80 mg/d (ref 50–100)
TOTAL PROTEIN, URINE: 16 mg/dL
TOTAL VOLUME, URINE: 500 mL

## 2014-12-26 LAB — UIFE/LIGHT CHAINS/TP QN, 24-HR UR
ALBUMIN, U: DETECTED
ALPHA 2 UR: DETECTED — AB
Alpha 1, Urine: DETECTED — AB
BETA UR: DETECTED — AB
Gamma Globulin, Urine: DETECTED — AB
TOTAL PROTEIN, URINE-UR/DAY: 75 mg/d (ref <150)
Time: 24 hours
Total Protein, Urine: 15 mg/dL (ref 5–24)
Volume, Urine: 500 mL

## 2014-12-26 LAB — 24 HR URINE,KAPPA/LAMBDA LIGHT CHAINS
MEASURED LAMBDA CHAIN: 0.42 mg/dL (ref <2.00)
Measured Kappa Chain: 1.65 mg/dL (ref <2.00)
TOTAL LAMBDA CHAIN: 2.1 mg/(24.h)
Total Kappa Chain: 8.25 mg/24 h
URINE VOLUME: 500 mL/(24.h)

## 2014-12-27 ENCOUNTER — Encounter (HOSPITAL_COMMUNITY)
Admission: RE | Disposition: A | Discharge status: Home / Self Care | Payer: Self-pay | Source: Ambulatory Visit | Attending: Cardiology

## 2014-12-27 ENCOUNTER — Encounter (HOSPITAL_COMMUNITY): Payer: Self-pay | Admitting: Cardiology

## 2014-12-27 ENCOUNTER — Telehealth (HOSPITAL_COMMUNITY): Payer: Self-pay | Admitting: Cardiology

## 2014-12-27 ENCOUNTER — Ambulatory Visit (HOSPITAL_COMMUNITY)
Admission: RE | Admit: 2014-12-27 | Discharge: 2014-12-27 | Disposition: A | Discharge status: Home / Self Care | Payer: Medicare Other | Source: Ambulatory Visit | Attending: Cardiology | Admitting: Cardiology

## 2014-12-27 ENCOUNTER — Encounter: Payer: Self-pay | Admitting: Gastroenterology

## 2014-12-27 DIAGNOSIS — K219 Gastro-esophageal reflux disease without esophagitis: Secondary | ICD-10-CM | POA: Diagnosis not present

## 2014-12-27 DIAGNOSIS — I10 Essential (primary) hypertension: Secondary | ICD-10-CM | POA: Diagnosis not present

## 2014-12-27 DIAGNOSIS — I209 Angina pectoris, unspecified: Secondary | ICD-10-CM | POA: Insufficient documentation

## 2014-12-27 DIAGNOSIS — F329 Major depressive disorder, single episode, unspecified: Secondary | ICD-10-CM | POA: Insufficient documentation

## 2014-12-27 DIAGNOSIS — I2584 Coronary atherosclerosis due to calcified coronary lesion: Secondary | ICD-10-CM | POA: Diagnosis not present

## 2014-12-27 DIAGNOSIS — Z79899 Other long term (current) drug therapy: Secondary | ICD-10-CM | POA: Diagnosis not present

## 2014-12-27 DIAGNOSIS — E785 Hyperlipidemia, unspecified: Secondary | ICD-10-CM | POA: Diagnosis not present

## 2014-12-27 DIAGNOSIS — I2510 Atherosclerotic heart disease of native coronary artery without angina pectoris: Secondary | ICD-10-CM | POA: Insufficient documentation

## 2014-12-27 DIAGNOSIS — G4733 Obstructive sleep apnea (adult) (pediatric): Secondary | ICD-10-CM | POA: Insufficient documentation

## 2014-12-27 DIAGNOSIS — I480 Paroxysmal atrial fibrillation: Secondary | ICD-10-CM | POA: Diagnosis not present

## 2014-12-27 DIAGNOSIS — Z6828 Body mass index (BMI) 28.0-28.9, adult: Secondary | ICD-10-CM | POA: Diagnosis not present

## 2014-12-27 DIAGNOSIS — Z7982 Long term (current) use of aspirin: Secondary | ICD-10-CM | POA: Diagnosis not present

## 2014-12-27 DIAGNOSIS — Z79891 Long term (current) use of opiate analgesic: Secondary | ICD-10-CM | POA: Insufficient documentation

## 2014-12-27 DIAGNOSIS — D649 Anemia, unspecified: Secondary | ICD-10-CM | POA: Diagnosis not present

## 2014-12-27 DIAGNOSIS — E669 Obesity, unspecified: Secondary | ICD-10-CM | POA: Insufficient documentation

## 2014-12-27 DIAGNOSIS — R0789 Other chest pain: Secondary | ICD-10-CM | POA: Diagnosis present

## 2014-12-27 DIAGNOSIS — K449 Diaphragmatic hernia without obstruction or gangrene: Secondary | ICD-10-CM | POA: Insufficient documentation

## 2014-12-27 HISTORY — PX: CARDIAC CATHETERIZATION: SHX172

## 2014-12-27 HISTORY — PX: LEFT HEART CATHETERIZATION WITH CORONARY ANGIOGRAM: SHX5451

## 2014-12-27 LAB — CBC
HEMATOCRIT: 36 % (ref 36.0–46.0)
HEMOGLOBIN: 11.3 g/dL — AB (ref 12.0–15.0)
MCH: 26.7 pg (ref 26.0–34.0)
MCHC: 31.4 g/dL (ref 30.0–36.0)
MCV: 85.1 fL (ref 78.0–100.0)
Platelets: 184 10*3/uL (ref 150–400)
RBC: 4.23 MIL/uL (ref 3.87–5.11)
RDW: 18.6 % — ABNORMAL HIGH (ref 11.5–15.5)
WBC: 5.6 10*3/uL (ref 4.0–10.5)

## 2014-12-27 LAB — PROTIME-INR
INR: 1.03 (ref 0.00–1.49)
PROTHROMBIN TIME: 13.6 s (ref 11.6–15.2)

## 2014-12-27 LAB — BASIC METABOLIC PANEL
ANION GAP: 6 (ref 5–15)
BUN: 10 mg/dL (ref 6–23)
CHLORIDE: 103 meq/L (ref 96–112)
CO2: 29 mmol/L (ref 19–32)
CREATININE: 0.75 mg/dL (ref 0.50–1.10)
Calcium: 9 mg/dL (ref 8.4–10.5)
GFR calc Af Amer: >90 mL/min (ref >90)
GFR calc non Af Amer: 81 mL/min — ABNORMAL LOW (ref >90)
Glucose, Bld: 101 mg/dL — ABNORMAL HIGH (ref 70–99)
POTASSIUM: 4.1 mmol/L (ref 3.5–5.1)
SODIUM: 138 mmol/L (ref 135–145)

## 2014-12-27 LAB — POCT ACTIVATED CLOTTING TIME
Activated Clotting Time: 165 seconds
Activated Clotting Time: 214 seconds

## 2014-12-27 SURGERY — LEFT HEART CATHETERIZATION WITH CORONARY ANGIOGRAM
Anesthesia: LOCAL

## 2014-12-27 MED ORDER — FENTANYL CITRATE 0.05 MG/ML IJ SOLN
INTRAMUSCULAR | Status: AC
  Filled 2014-12-27: qty 2

## 2014-12-27 MED ORDER — SODIUM CHLORIDE 0.9 % IV SOLN
INTRAVENOUS | Status: DC
  Administered 2014-12-27: 11:00:00 via INTRAVENOUS

## 2014-12-27 MED ORDER — HEPARIN (PORCINE) IN NACL 2-0.9 UNIT/ML-% IJ SOLN
INTRAMUSCULAR | Status: AC
  Filled 2014-12-27: qty 1500

## 2014-12-27 MED ORDER — SODIUM CHLORIDE 0.9 % IV SOLN: 250.0000 mL | INTRAVENOUS | Status: DC | PRN

## 2014-12-27 MED ORDER — HEPARIN SODIUM (PORCINE) 1000 UNIT/ML IJ SOLN
INTRAMUSCULAR | Status: AC
  Filled 2014-12-27: qty 1

## 2014-12-27 MED ORDER — SODIUM CHLORIDE 0.9 % IJ SOLN: 3.0000 mL | INTRAMUSCULAR | Status: DC | PRN

## 2014-12-27 MED ORDER — LIDOCAINE HCL (PF) 1 % IJ SOLN
INTRAMUSCULAR | Status: AC
  Filled 2014-12-27: qty 30

## 2014-12-27 MED ORDER — NITROGLYCERIN 1 MG/10 ML FOR IR/CATH LAB
INTRA_ARTERIAL | Status: AC
  Filled 2014-12-27: qty 10

## 2014-12-27 MED ORDER — VERAPAMIL HCL 2.5 MG/ML IV SOLN
INTRAVENOUS | Status: AC
  Filled 2014-12-27: qty 2

## 2014-12-27 MED ORDER — MIDAZOLAM HCL 2 MG/2ML IJ SOLN
INTRAMUSCULAR | Status: AC
  Filled 2014-12-27: qty 2

## 2014-12-27 MED ORDER — ASPIRIN 81 MG PO CHEW: 81.0000 mg | CHEWABLE_TABLET | ORAL | Status: DC

## 2014-12-27 MED ORDER — ADENOSINE 12 MG/4ML IV SOLN
16.0000 mL | Freq: Once | INTRAVENOUS | Status: AC
  Administered 2014-12-27: 48 mg via INTRAVENOUS
  Filled 2014-12-27: qty 16

## 2014-12-27 MED ORDER — SODIUM CHLORIDE 0.9 % IJ SOLN: 3.0000 mL | Freq: Two times a day (BID) | INTRAMUSCULAR | Status: DC

## 2014-12-27 NOTE — CV Procedure (Signed)
    CARDIAC CATH NOTE  Name: [name] [name] MRN: [id number] DOB: June 08, 1941  Procedure: FFR of the distal LM/ostial LCx/LAD  Indication: moderate distal left main stenosis with heavy calcification  Procedural Details: There was an indwelling sheath in the right radial artery. Dr. Aundra Dubin completed diagnostic procedure which demonstrated heavily calcified moderate distal left main disease leading into a patent LAD and a stenotic ostial left circumflex lesion. After review of her films, we elected to proceed with pressure wire analysis of the left main interface with the LAD and left circumflex in order to determine whether revascularization may be indicated. Intravenous heparin was administered. Once a therapeutic ACT was achieved, a 5 [place] EBU guide catheter was inserted.  A cougar coronary guidewire was used to cross the lesion and was advanced into the distal circumflex.  The navvus catheter was equalized at the guide tip. The ostial left circumflex lesion was interrogated first. Intravenous adenosine was administered. The baseline FFR was 0.86. The FFR at peak hyperemia was 0.66. The guidewire was then redirected into the LAD while intravenous adenosine was continued. The FFR of the proximal LAD was 0.82. The patient tolerated the procedure well. There were no immediate procedural complications. A TR band was used for radial hemostasis. The patient was transferred to the post catheterization recovery area for further monitoring.  Estimated Blood Loss: Minimal  Conclusions:  Hemodynamically significant distal left main/ostial circumflex stenosis. Borderline significant stenosis extending into the LAD.  Recommendations: Consideration of CABG versus PCI for revascularization. If PCI were chosen, I think the patient would require rotational atherectomy of the left main into the circumflex followed by bifurcation stenting. Would recommend surgical evaluation and if the patient is considered to be a  poor candidate for surgery, PCI would be reasonable.  Sherren Mocha MD, [facility] 12/27/2014, 1:29 PM

## 2014-12-27 NOTE — CV Procedure (Addendum)
    Cardiac Catheterization Procedure Note  Name: [name] [name] MRN: [id number] DOB: [birth year]  Procedure: Selective Coronary Angiography  Indication: Atypical chest pain, intermediate risk Cardiolite.    Procedural Details: The right wrist was prepped, draped, and anesthetized with 1% lidocaine. Using the modified Seldinger technique, a 6 French Slender sheath was introduced into the right radial artery. 3 mg of verapamil was administered through the sheath, weight-based unfractionated heparin was administered intravenously. [place] and JL3.5n were used for selective coronary angiography. Catheter exchanges were performed over an exchange length guidewire. There were no immediate procedural complications. The patient will undergo FFR evaluation of left main stenosis.   Procedural Findings: Hemodynamics: AO 120/52  Coronary angiography: Coronary dominance: right  Left mainstem: Calcified plaque distal left main.  In some views, this is concerning for 50%+ stenosis.   Left anterior descending (LAD): 30% proximal LAD stenosis.   Left circumflex (LCx): up to 70% ostial LCX stenosis.   Right coronary artery (RCA): 40% mid RCA stenosis.   Left ventriculography: Not done.    Final Conclusions:  Calcified distal left main, in some views concerning for significant (>50% stenosis).  Ostial approximate 70% LCx stenosis.  Will plan FFR of the distal left main (Dr Burt Knack).   Loralie Champagne MD, [facility] 12/27/2014, 12:55 PM   FFR positive down both LAD (borderline) and LCx (markedly).  Suspect tight ostial LCx stenosis and significant distal left main stenosis.  Discussed with Dr Burt Knack, PCI feasible but difficult.  Will get surgical evaluation first, but think she would have a lot of trouble rehabbing from CABG.  Will start Imdur 30 mg daily, followup with me in 1 week.   Loralie Champagne 12/27/2014 1:30 PM

## 2014-12-27 NOTE — Telephone Encounter (Signed)
Pt scheduled for r/l heart cath on 12/27/14 with Dr.McLean Cpt code 93458 With pts current insurance [facility] Pre cert #[id number]

## 2014-12-27 NOTE — Discharge Instructions (Signed)
Radial Site Care °Refer to this sheet in the next few weeks. These instructions provide you with information on caring for yourself after your procedure. Your caregiver may also give you more specific instructions. Your treatment has been planned according to current medical practices, but problems sometimes occur. Call your caregiver if you have any problems or questions after your procedure. °HOME CARE INSTRUCTIONS °· You may shower the day after the procedure. Remove the bandage (dressing) and gently wash the site with plain soap and water. Gently pat the site dry. °· Do not apply powder or lotion to the site. °· Do not submerge the affected site in water for 3 to 5 days. °· Inspect the site at least twice daily. °· Do not flex or bend the affected arm for 24 hours. °· No lifting over 5 pounds (2.3 kg) for 5 days after your procedure. °· Do not drive home if you are discharged the same day of the procedure. Have someone else drive you. °· You may drive 24 hours after the procedure unless otherwise instructed by your caregiver. °· Do not operate machinery or power tools for 24 hours. °· A responsible adult should be with you for the first 24 hours after you arrive home. °What to expect: °· Any bruising will usually fade within 1 to 2 weeks. °· Blood that collects in the tissue (hematoma) may be painful to the touch. It should usually decrease in size and tenderness within 1 to 2 weeks. °SEEK IMMEDIATE MEDICAL CARE IF: °· You have unusual pain at the radial site. °· You have redness, warmth, swelling, or pain at the radial site. °· You have drainage (other than a small amount of blood on the dressing). °· You have chills. °· You have a fever or persistent symptoms for more than 72 hours. °· You have a fever and your symptoms suddenly get worse. °· Your arm becomes pale, cool, tingly, or numb. °· You have heavy bleeding from the site. Hold pressure on the site. °Document Released: 01/09/2011 Document Revised:  02/29/2012 Document Reviewed: 01/09/2011 °ExitCare® Patient Information ©2015 [employer]. This information is not intended to replace advice given to you by your health care provider. Make sure you discuss any questions you have with your health care provider. ° °

## 2014-12-27 NOTE — H&P (View-Only) (Signed)
Patient ID: [name] [name], female   DOB: 02/14/1941, 74 y.o.   MRN: [id number] PCP: Laurance Flatten  74 yo with history of HTN, hyperlipidemia, and nonobstructive CAD presents for cardiology followup.  Prior to a back surgery, she had Lexiscan Cardiolite in 11/14 with no ischemia or infarction.  After her first operation, she remained in considerable pain.  She had repeat operation in 9/15 (spinal fusion T3-T12).  Post-op, she had atrial fibrillation.  She had DCCV to NSR but was not sent home on anticoagulation.  She remains in NSR. Her post-operative course was difficult.  She was orthostatic with multiple falls as well as anemia and confusion.  She was kept off anticoagulation because of this.  Confusion has improved.  She is still mildly lightheaded with standing but no falls over the last few weeks.    In 12/15, she was seen by Truitt Merle with chest pain and dyspnea.  Lexiscan Cardiolite was done in 12/15 showing normal EF but areas of ischemia concerning for multivessel disease. She has episodes of chest soreness that occur daily.  Not related to exertion.  She also has pain in her back related to recent surgery.    Labs (9/14) K 4.4, creatinine 0.81, LDL particle number 1279, LDL 69 Labs (5/15): K 4.5, creatinine 0.8, LDL-P 745, LDL 38 Labs (12/15): K 4, creatinine 0.63, HCT 37.2, BNP 366  PMH: 1. Chronic low back pain: spinal stenosis.  Spinal fusion with rods in 2/15 at [facility].  Repeat spinal fusion 9/15.  2. Obesity 3. H/o diverticulitis 4. IBS 5. H/o colon polyps 6. GERD: h/o hiatal hernia 7. OSA 8. HTN 9. Hyperlipidemia 10. Depression 11. Cholecystectomy 12. H/o bilateral TKR 13. CAD: LHC (5/09) with 50% ostial LCx, 30-40% mRCA, 30% mLAD.  Lexiscan Cardiolite in 11/14 with EF 68%, no ischemia or infarction. Lexiscan Cardiolite (12/15) with EF 57%, moderate anteroapical and anteroseptal ischemia, inferolateral infarct with peri-infarct ischemia => suggestive of multivessel disease.   14. PNA in 3/15 15. Atrial fibrillation: Paroxysmal.  Has only been noted post-op 9/15 spinal fusion.  She had cardioversion, not on anticoagulation.  16. MGUS: Negative bone marrow biopsy 12/15.  17. Anemia  SH: Widow, 2 children, retired Licensed conveyancer, nonsmoker.    FH: CAD in father, mother, brother.    ROS: All systems reviewed and negative except as per HPI.   Current Outpatient Prescriptions  Medication Sig Dispense Refill  . amLODipine (NORVASC) 2.5 MG tablet Take 1 tablet by mouth  daily. For HTN (Patient taking differently: Take 2.5 mg by mouth daily. ) 90 tablet 3  . aspirin EC 81 MG tablet Take 81 mg by mouth daily. For prophylasxis    . Calcium Carbonate-Vit D-Min (CALCIUM 1200) 1200-1000 MG-UNIT CHEW Chew 1 each by mouth daily.    . colesevelam (WELCHOL) 625 MG tablet Take 625 mg by mouth daily with breakfast.    . diphenoxylate-atropine (LOMOTIL) 2.5-0.025 MG per tablet Take 1 tablet by mouth 4 (four) times daily as needed for diarrhea or loose stools.     Marland Kitchen esomeprazole (NEXIUM) 40 MG capsule Take 1 capsule (40 mg total) by mouth 2 (two) times daily before a meal. For GERD 180 capsule 3  . Eszopiclone 3 MG TABS Take 1 tablet (3 mg total) by mouth at bedtime. For insomnia,take immediately before bedtime. 90 tablet 1  . fentaNYL (DURAGESIC - DOSED MCG/HR) 50 MCG/HR Place 1 patch (50 mcg total) onto the skin every 3 (three) days. 10 patch 0  . FLUoxetine (  PROZAC) 40 MG capsule Take 1 capsule (40 mg total) by mouth daily. For depression 90 capsule 3  . furosemide (LASIX) 20 MG tablet Take 1 tablet (20 mg total) by mouth daily. 90 tablet 3  . HYDROcodone-acetaminophen (NORCO) 10-325 MG per tablet Take 1 tablet by mouth every 4 (four) hours as needed for moderate pain.     . iron polysaccharides (NIFEREX) 150 MG capsule Take 1 capsule (150 mg total) by mouth daily. 90 capsule 3  . LORazepam (ATIVAN) 0.5 MG tablet Take 1 tablet (0.5 mg total) by mouth 2 (two) times daily as needed for  anxiety. 60 tablet 1  . meclizine (ANTIVERT) 25 MG tablet Take 1 tablet (25 mg total) by mouth 3 (three) times daily. (Patient taking differently: Take 25 mg by mouth 3 (three) times daily as needed for dizziness. ) 30 tablet 0  . methocarbamol (ROBAXIN) 500 MG tablet Take 500 mg by mouth every 6 (six) hours as needed for muscle spasms.     . Multiple Vitamin (MULTIVITAMIN) tablet Take 1 tablet by mouth daily. For supplement    . rosuvastatin (CRESTOR) 20 MG tablet Take 1 tablet (20 mg total) by mouth daily. (Patient taking differently: Take 10-20 mg by mouth every other day. 20 mg one day alternate 10 mg the next day and repeats) 90 tablet 3  . amoxicillin-clavulanate (AUGMENTIN) 875-125 MG per tablet Take 1 tablet by mouth every 12 (twelve) hours. 14 tablet 0   No current facility-administered medications for this encounter.   BP 120/62 mmHg  Pulse 67  Wt 188 lb 1.9 oz (85.331 kg)  SpO2 94% General: NAD Neck: No JVD, no thyromegaly or thyroid nodule.  Lungs: Clear to auscultation bilaterally with normal respiratory effort. CV: Nondisplaced PMI.  Heart regular S1/S2, no S3/S4, no murmur.  No peripheral edema.  No carotid bruit.  Normal pedal pulses.  Abdomen: Soft, nontender, no hepatosplenomegaly, no distention.  Skin: Intact without lesions or rashes.  Neurologic: Alert and oriented x 3.  Psych: Normal affect.  Assessment/Plan: 1. CAD: Atypical chest pain but recent abnormal Cardiolite concerning for multivessel disease. - Continue ASA 81 and statin.  - I will arrange for left heart cath next week.  2. Hyperlipidemia: Excellent LDL and improved LDL particle number on lipid profile in 5/15.  Continue current Crestor dosing.  3. HTN: BP controlled on current medications.    4. Atrial fibrillation: Paroxysmal.  Has only occurred post-op spinal fusion.  She was not anticoagulated given frequent falls.  I will leave her off for now but if she has recurrent atrial fibrillation, would start  anticoagulation as she is now less orthostatic.    Loralie Champagne 12/22/2014

## 2014-12-27 NOTE — Interval H&P Note (Signed)
Cath Lab Visit (complete for each Cath Lab visit)  Clinical Evaluation Leading to the Procedure:   ACS: No.  Non-ACS:    Anginal Classification: CCS III  Anti-ischemic medical therapy: Minimal Therapy (1 class of medications)  Non-Invasive Test Results: Intermediate-risk stress test findings: cardiac mortality 1-3%/year  Prior CABG: No previous CABG      History and Physical Interval Note:  12/27/2014 12:07 PM  [name]  has presented today for surgery, with the diagnosis of hf  The various methods of treatment have been discussed with the patient and family. After consideration of risks, benefits and other options for treatment, the patient has consented to  Procedure(s): LEFT HEART CATHETERIZATION WITH CORONARY ANGIOGRAM (N/A) as a surgical intervention .  The patient's history has been reviewed, patient examined, no change in status, stable for surgery.  I have reviewed the patient's chart and labs.  Questions were answered to the patient's satisfaction.     [name] [name] [employer]

## 2014-12-28 ENCOUNTER — Institutional Professional Consult (permissible substitution) (INDEPENDENT_AMBULATORY_CARE_PROVIDER_SITE_OTHER): Payer: 59 | Admitting: Cardiothoracic Surgery

## 2014-12-28 ENCOUNTER — Telehealth: Payer: Self-pay | Admitting: *Deleted

## 2014-12-28 ENCOUNTER — Other Ambulatory Visit: Payer: Self-pay | Admitting: *Deleted

## 2014-12-28 ENCOUNTER — Encounter: Payer: Self-pay | Admitting: *Deleted

## 2014-12-28 ENCOUNTER — Encounter: Payer: Self-pay | Admitting: Cardiothoracic Surgery

## 2014-12-28 VITALS — BP 136/68 | HR 73 | Resp 20 | Ht 67.0 in | Wt 187.0 lb

## 2014-12-28 DIAGNOSIS — I25118 Atherosclerotic heart disease of native coronary artery with other forms of angina pectoris: Secondary | ICD-10-CM

## 2014-12-28 DIAGNOSIS — I2510 Atherosclerotic heart disease of native coronary artery without angina pectoris: Secondary | ICD-10-CM

## 2014-12-28 DIAGNOSIS — I209 Angina pectoris, unspecified: Secondary | ICD-10-CM | POA: Insufficient documentation

## 2014-12-28 MED ORDER — CLOPIDOGREL BISULFATE 75 MG PO TABS: 75.0000 mg | ORAL_TABLET | Freq: Every day | ORAL | Status: DC

## 2014-12-28 MED ORDER — PANTOPRAZOLE SODIUM 40 MG PO TBEC: 40.0000 mg | DELAYED_RELEASE_TABLET | Freq: Every day | ORAL | Status: DC

## 2014-12-28 NOTE — Progress Notes (Signed)
PCP is Redge Gainer, MD Referring Provider is Larey Dresser, MD  Chief Complaint  Patient presents with  . Coronary Artery Disease    Surgical eval, Cardiac Cath 12/27/13   patient examined, cardiac catheterization and most recent echocardiogram reviewed  HPI: Chronically ill 74 year old Caucasian female non-smoker nondiabetic presents one day after cardiac catheterization showing significant left main and ostial circumflex stenosis with preserved LV function. The patient has had symptoms including chest discomfort, dizziness, falls, and history of atrial fibrillation . The patient has had major spinal reconstructive surgery in February 2015 and repeated in September 2015 with rods and spinal fusion from her lumbar to or thoracic vertebrae performed at [facility]. She denies any prior history of MI but after her second spinal fusion operation at September she developed atrial fibrillation and was cardioverted.she is not placed on oral anticoagulation because of her history of dizziness and several falls. The patient is very poorly mobile and uses a rolling walker. She has history of reduced mini stroke. She is generalized weakness and was in a skilled nursing facility for 6-8 weeks after her last spinal operation. She presents now for evaluation of potential surgical coronary revascularization for her left main and proximal circumflex disease.  Past Medical History  Diagnosis Date  . Obesity   . Circadian rhythm sleep disorder   . CTS (carpal tunnel syndrome)   . Diarrhea   . IBS (irritable bowel syndrome)   . Diverticulitis of colon   . Gastritis   . Esophageal stricture   . Personal history of colonic polyps 10/25/2011 & 12/02/11    not retrieved Dr Lyla Son & tubular adenomas  . Hiatal hernia   . GERD (gastroesophageal reflux disease)   . Obstructive sleep apnea   . Insomnia   . HTN (hypertension)   . Hyperlipidemia   . Depression   . DIASTOLIC HEART FAILURE, CHRONIC  04/04/2009    Qualifier: Diagnosis of  By: Mare Ferrari, RMA, Sherri    . Memory disorder 12/04/2014    Past Surgical History  Procedure Laterality Date  . Carpal tunnel release    . Cholecystectomy  2003  . Total knee arthroplasty      bilateral  . Cataract extraction Bilateral   . Left heart catheterization with coronary angiogram N/A 12/27/2014    Procedure: LEFT HEART CATHETERIZATION WITH CORONARY ANGIOGRAM;  Surgeon: Larey Dresser, MD;  Location: [facility] CATH LAB;  Service: Cardiovascular;  Laterality: N/A;  . Cardiac catheterization  12/27/2014    Procedure: INTRAVASCULAR PRESSURE WIRE/FFR STUDY;  Surgeon: Larey Dresser, MD;  Location: [facility] CATH LAB;  Service: Cardiovascular;;    Family History  Problem Relation Age of Onset  . Coronary artery disease Father   . Peripheral vascular disease Father   . Coronary artery disease Mother   . Coronary artery disease Brother   . Colon cancer Sister   . Emphysema Sister   . Sleep apnea Son   . Colon cancer Sister     spread to her brain  . Dementia Neg Hx   . Emphysema Brother     Social History History  Substance Use Topics  . Smoking status: Never Smoker   . Smokeless tobacco: Never Used  . Alcohol Use: Yes     Comment: very rare    Current Outpatient Prescriptions  Medication Sig Dispense Refill  . amLODipine (NORVASC) 2.5 MG tablet Take 1 tablet by mouth  daily. For HTN (Patient taking differently: Take 2.5 mg by mouth daily. )  90 tablet 3  . aspirin EC 81 MG tablet Take 81 mg by mouth daily. For prophylasxis    . Calcium Carbonate-Vit D-Min (CALCIUM 1200) 1200-1000 MG-UNIT CHEW Chew 1 each by mouth daily.    . colesevelam (WELCHOL) 625 MG tablet Take 625 mg by mouth daily with breakfast.    . diphenoxylate-atropine (LOMOTIL) 2.5-0.025 MG per tablet Take 1 tablet by mouth 4 (four) times daily as needed for diarrhea or loose stools.     Marland Kitchen esomeprazole (NEXIUM) 40 MG capsule Take 1 capsule (40 mg total) by mouth 2 (two) times daily  before a meal. For GERD 180 capsule 3  . Eszopiclone 3 MG TABS Take 1 tablet (3 mg total) by mouth at bedtime. For insomnia,take immediately before bedtime. 90 tablet 1  . fentaNYL (DURAGESIC - DOSED MCG/HR) 50 MCG/HR Place 1 patch (50 mcg total) onto the skin every 3 (three) days. 10 patch 0  . FLUoxetine (PROZAC) 40 MG capsule Take 1 capsule (40 mg total) by mouth daily. For depression 90 capsule 3  . furosemide (LASIX) 20 MG tablet Take 1 tablet (20 mg total) by mouth daily. 90 tablet 3  . HYDROcodone-acetaminophen (NORCO) 10-325 MG per tablet Take 1 tablet by mouth every 4 (four) hours as needed for moderate pain.     . iron polysaccharides (NIFEREX) 150 MG capsule Take 1 capsule (150 mg total) by mouth daily. 90 capsule 3  . isosorbide mononitrate (IMDUR) 30 MG 24 hr tablet Take 30 mg by mouth daily.     Marland Kitchen LORazepam (ATIVAN) 0.5 MG tablet Take 1 tablet (0.5 mg total) by mouth 2 (two) times daily as needed for anxiety. 60 tablet 1  . meclizine (ANTIVERT) 25 MG tablet Take 1 tablet (25 mg total) by mouth 3 (three) times daily. (Patient taking differently: Take 25 mg by mouth 3 (three) times daily as needed for dizziness. ) 30 tablet 0  . methocarbamol (ROBAXIN) 500 MG tablet Take 500 mg by mouth every 6 (six) hours as needed for muscle spasms.     . Multiple Vitamin (MULTIVITAMIN) tablet Take 1 tablet by mouth daily. For supplement    . rosuvastatin (CRESTOR) 20 MG tablet Take 1 tablet (20 mg total) by mouth daily. (Patient taking differently: Take 10-20 mg by mouth every other day. 20 mg one day alternate 10 mg the next day and repeats) 90 tablet 3   No current facility-administered medications for this visit.    Allergies  Allergen Reactions  . Cymbalta [Duloxetine Hcl] Other (See Comments)    Personality changes - crying  2014  . Morphine And Related Itching  . Oxycodone-Acetaminophen Other (See Comments)    Personality changes   . Valium [Diazepam] Other (See Comments)    Personality  changes - "in another world" ;  Hallucinations  2015  . Altace [Ramipril] Other (See Comments)    unknown  . Floxin [Ofloxacin] Other (See Comments)    unknown  . Lipitor [Atorvastatin] Other (See Comments)    Muscle weakness   . Lovaza [Omega-3-Acid Ethyl Esters] Other (See Comments)    Muscle weakness   . Pravachol [Pravastatin Sodium] Other (See Comments)    Muscle weakness   . Trilipix [Choline Fenofibrate] Other (See Comments)    Muscle weakness   . Zetia [Ezetimibe] Other (See Comments)    Muscle weakness   . Zocor [Simvastatin] Other (See Comments)    Muscle weakness     Review of Systems  The patient is right-hand dominant The  patient denies any serious bleeding problems after surgical procedures but has had chronic anemia The patient has had previous cholecystectomy and bilateral total knee replacement surgery. The patient denies history of varicose veins or DVT The patient denies history of thoracic trauma pneumothorax or rib fracture.  BP 136/68 mmHg  Pulse 73  Resp 20  Ht 5\' 7"  (1.702 m)  Wt 187 lb (84.823 kg)  BMI 29.28 kg/m2  SpO2 96% Physical Exam Chronically ill obese female accompanied by daughter no acute distress. She is very weak and has trouble even standing for exam HEENT ecchymoses under the left orbit and over the left mandible from a recent fall. Pupils equal dentition adequate Neck with ecchymoses from a recent fall, no JVD or bruit Lymphatics-no palpable cervical or supralevator adenopathy Thorax-diminished breath sounds bilaterally, no deformity   Very poor pulmonary mechanics by bedside assessment Cardiac-regular rhythm, positive S4 no murmur Abdomen-soft nontender without pulsatile mass Extremities-dressing over her right wrist for the radial artery puncture for cardiac catheterization, some swelling but no hematoma, 2+ ankle edema Neurologic-. Very un-steady on her feet , generally weak  Diagnostic Tests:  coronary arterio- gram,  echocardiogram,reviewed  Impression: Significant left main, proximal circumflex stenosis Obese with extreme deconditioning ,several falls, and poor pulmonary mechanics The patient would have a very high likelihood of a poor outcome after multivessel CABG with a prolonged hospitalization, possible permanent nursing home placement or mortality. Recommend percutaneous intervention by cardiology.  Plan:would not recommend CABG for this patient because of her severe comorbid and multiple medical problems

## 2014-12-28 NOTE — Telephone Encounter (Signed)
Procedure instructions left at front desk for patient to pick up when she comes for lab 01/01/15.

## 2014-12-28 NOTE — Telephone Encounter (Signed)
Pt scheduled for a rotational atherectomy with stents 01/07/15 12 Noon--Dr Burt Knack. Pre-procedure lab scheduled for 01/01/15 [facility]  Discussed with pt's daughter,Bille. She verbalized good understanding and states she will discuss with patient.

## 2015-01-01 ENCOUNTER — Other Ambulatory Visit (INDEPENDENT_AMBULATORY_CARE_PROVIDER_SITE_OTHER): Payer: Medicare Other | Admitting: *Deleted

## 2015-01-01 DIAGNOSIS — I25118 Atherosclerotic heart disease of native coronary artery with other forms of angina pectoris: Secondary | ICD-10-CM

## 2015-01-01 LAB — CBC WITH DIFFERENTIAL/PLATELET
BASOS ABS: 0 10*3/uL (ref 0.0–0.1)
BASOS PCT: 0.4 % (ref 0.0–3.0)
EOS PCT: 4.2 % (ref 0.0–5.0)
Eosinophils Absolute: 0.2 10*3/uL (ref 0.0–0.7)
HCT: 36.2 % (ref 36.0–46.0)
HEMOGLOBIN: 11.7 g/dL — AB (ref 12.0–15.0)
LYMPHS PCT: 43.2 % (ref 12.0–46.0)
Lymphs Abs: 2.4 10*3/uL (ref 0.7–4.0)
MCHC: 32.3 g/dL (ref 30.0–36.0)
MCV: 85.1 fl (ref 78.0–100.0)
Monocytes Absolute: 0.4 10*3/uL (ref 0.1–1.0)
Monocytes Relative: 7.7 % (ref 3.0–12.0)
NEUTROS ABS: 2.4 10*3/uL (ref 1.4–7.7)
Neutrophils Relative %: 44.5 % (ref 43.0–77.0)
Platelets: 205 10*3/uL (ref 150.0–400.0)
RBC: 4.25 Mil/uL (ref 3.87–5.11)
RDW: 20.8 % — ABNORMAL HIGH (ref 11.5–15.5)
WBC: 5.5 10*3/uL (ref 4.0–10.5)

## 2015-01-01 LAB — BASIC METABOLIC PANEL
BUN: 12 mg/dL (ref 6–23)
CHLORIDE: 101 meq/L (ref 96–112)
CO2: 32 meq/L (ref 19–32)
CREATININE: 0.7 mg/dL (ref 0.4–1.2)
Calcium: 9 mg/dL (ref 8.4–10.5)
GFR: 86.62 mL/min (ref >60.00)
Glucose, Bld: 97 mg/dL (ref 70–99)
Potassium: 4.6 mEq/L (ref 3.5–5.1)
Sodium: 136 mEq/L (ref 135–145)

## 2015-01-01 LAB — PROTIME-INR
INR: 1 ratio (ref 0.8–1.0)
Prothrombin Time: 10.9 s (ref 9.6–13.1)

## 2015-01-02 ENCOUNTER — Encounter (HOSPITAL_COMMUNITY): Payer: Self-pay | Admitting: Pharmacy Technician

## 2015-01-03 ENCOUNTER — Ambulatory Visit (INDEPENDENT_AMBULATORY_CARE_PROVIDER_SITE_OTHER): Payer: Medicare Other | Admitting: Family Medicine

## 2015-01-03 ENCOUNTER — Encounter: Payer: Self-pay | Admitting: Family Medicine

## 2015-01-03 VITALS — BP 118/66 | HR 66 | Temp 98.4°F | Ht 67.0 in | Wt 187.0 lb

## 2015-01-03 DIAGNOSIS — K219 Gastro-esophageal reflux disease without esophagitis: Secondary | ICD-10-CM

## 2015-01-03 DIAGNOSIS — S023XXS Fracture of orbital floor, sequela: Secondary | ICD-10-CM

## 2015-01-03 DIAGNOSIS — I10 Essential (primary) hypertension: Secondary | ICD-10-CM

## 2015-01-03 DIAGNOSIS — M47812 Spondylosis without myelopathy or radiculopathy, cervical region: Secondary | ICD-10-CM

## 2015-01-03 DIAGNOSIS — E8881 Metabolic syndrome: Secondary | ICD-10-CM

## 2015-01-03 DIAGNOSIS — E785 Hyperlipidemia, unspecified: Secondary | ICD-10-CM

## 2015-01-03 DIAGNOSIS — S0232XS Fracture of orbital floor, left side, sequela: Secondary | ICD-10-CM

## 2015-01-03 DIAGNOSIS — F32A Depression, unspecified: Secondary | ICD-10-CM

## 2015-01-03 DIAGNOSIS — E559 Vitamin D deficiency, unspecified: Secondary | ICD-10-CM

## 2015-01-03 DIAGNOSIS — M4716 Other spondylosis with myelopathy, lumbar region: Secondary | ICD-10-CM

## 2015-01-03 DIAGNOSIS — F329 Major depressive disorder, single episode, unspecified: Secondary | ICD-10-CM

## 2015-01-03 LAB — POCT CBC
Granulocyte percent: 64.1 % (ref 37–80)
HCT, POC: 40.1 % (ref 37.7–47.9)
Hemoglobin: 12.3 g/dL (ref 12.2–16.2)
Lymph, poc: 2 (ref 0.6–3.4)
MCH, POC: 26.2 pg — AB (ref 27–31.2)
MCHC: 30.8 g/dL — AB (ref 31.8–35.4)
MCV: 85.2 fL (ref 80–97)
MPV: 8.9 fL (ref 0–99.8)
POC Granulocyte: 4.2 (ref 2–6.9)
POC LYMPH PERCENT: 31.5 % (ref 10–50)
Platelet Count, POC: 232 10*3/uL (ref 142–424)
RBC: 4.7 M/uL (ref 4.04–5.48)
RDW, POC: 20.8 %
WBC: 6.5 10*3/uL (ref 4.6–10.2)

## 2015-01-03 MED ORDER — AMOXICILLIN 500 MG PO CAPS: 500.0000 mg | ORAL_CAPSULE | Freq: Three times a day (TID) | ORAL | Status: DC

## 2015-01-03 NOTE — Patient Instructions (Addendum)
Medicare Annual Wellness Visit  [facility] and the medical providers at [facility] strive to bring you the best medical care.  In doing so we not only want to address your current medical conditions and concerns but also to detect new conditions early and prevent illness, disease and health-related problems.    Medicare offers a yearly Wellness Visit which allows our clinical staff to assess your need for preventative services including immunizations, lifestyle education, counseling to decrease risk of preventable diseases and screening for fall risk and other medical concerns.    This visit is provided free of charge (no copay) for all Medicare recipients. The clinical pharmacists at [facility] have begun to conduct these Wellness Visits which will also include a thorough review of all your medications.    As you primary medical provider recommend that you make an appointment for your Annual Wellness Visit if you have not done so already this year.  You may set up this appointment before you leave today or you may call back ([phone]) and schedule an appointment.  Please make sure when you call that you mention that you are scheduling your Annual Wellness Visit with the clinical pharmacist so that the appointment may be made for the proper length of time.     Continue current medications. Continue good therapeutic lifestyle changes which include good diet and exercise. Fall precautions discussed with patient. If an FOBT was given today- please return it to our front desk. If you are over 63 years old - you may need Prevnar 34 or the adult Pneumonia vaccine.  Flu Shots will be available at our office starting mid- September. Please call and schedule a FLU CLINIC APPOINTMENT.   Follow through with stenting procedure on Monday Take antibiotic as directed We will arrange another appointment for you to see the ear nose and throat  specialist  regarding the left orbital fracture from the most recent fall Continue to be careful and did not put yourself at risk for falling Have more faith

## 2015-01-03 NOTE — Progress Notes (Signed)
Subjective:    Patient ID: [name] [name], female    DOB: [birth year], 74 y.o.   MRN: [id number]  HPI Pt here for follow up and management of chronic medical problems which include CAD, Atrial fibrillation, hypertension, and hyperlipidemia. She is taking medications regularly. The patient comes to the visit today with her daughter. She continues to have had a lot of problems with her neck and back and has fallen again and had received a laceration. She was seen in the emergency room and this was taken care of. She is also seen the cardiologist and all of this information is in the computer record. Additional studies were done and even though it was recommended that she have bypass surgery it was felt like she was not a good candidate for this and instead stents are being planned. The patient remains somewhat depressed as usual about all the things that are happening to her and she was given of peptic talk during the visit to be more positive and to move ahead with the medical things that are planned and get them behind her.       Patient Active Problem List   Diagnosis Date Noted  . Ischemic chest pain   . Paroxysmal atrial fibrillation 12/22/2014  . CAD (coronary artery disease) 12/22/2014  . Memory disorder 12/04/2014  . Deficiency anemia 11/10/2014  . Vertigo 10/27/2014  . Metabolic syndrome 41/93/7902  . S/P spinal fusion 02/23/2014  . Acute blood loss anemia 02/23/2014  . Chronic pain associated with significant psychosocial dysfunction 02/23/2014  . Preoperative evaluation of a medical condition to rule out surgical contraindications (TAR required) 10/09/2013  . History of IBS 01/26/2012  . 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 EDEMA 06/25/2010  . DEGENERATIVE JOINT DISEASE 04/08/2010  . OTHER DISORDERS OF NERVOUS SYSTEM&SENSE ORGANS 04/08/2010  . HYPERCHOLESTEROLEMIA  IIA 04/08/2009  . Obesity (BMI 30.0-34.9) 04/04/2009  . CIRCADIAN RHYTHM SLEEP D/O DELAY SLEEP PHSE TYPE  04/04/2009  . CARPAL TUNNEL SYNDROME 04/04/2009  . CAD, UNSPECIFIED SITE 04/04/2009  . DIASTOLIC HEART FAILURE, CHRONIC 04/04/2009  . DIARRHEA 01/17/2009  . IRRITABLE BOWEL SYNDROME 11/13/2008  . ESOPHAGEAL STRICTURE 11/12/2008  . GERD 11/12/2008  . DUODENITIS, WITHOUT HEMORRHAGE 11/12/2008  . Diaphragmatic Hernia without Mention of Obstruction or Gangrene 11/12/2008  . DIVERTICULOSIS, COLON 11/12/2008  . COLONIC POLYPS, HX OF 11/12/2008  . Obstructive sleep apnea 05/23/2008  . DYSPNEA 05/23/2008  . HYPERLIPIDEMIA 05/22/2008  . Depression 05/22/2008  . Essential hypertension 05/22/2008  . INSOMNIA 05/22/2008   Outpatient Encounter Prescriptions as of 01/03/2015  Medication Sig  . amLODipine (NORVASC) 2.5 MG tablet Take 1 tablet by mouth  daily. For HTN (Patient taking differently: Take 2.5 mg by mouth daily. )  . aspirin EC 81 MG tablet Take 81 mg by mouth daily. For prophylasxis  . Calcium Carbonate-Vit D-Min (CALCIUM 1200) 1200-1000 MG-UNIT CHEW Chew 1 each by mouth daily.  . clopidogrel (PLAVIX) 75 MG tablet Take 1 tablet (75 mg total) by mouth daily.  . colesevelam (WELCHOL) 625 MG tablet Take 625 mg by mouth daily with breakfast.  . diphenoxylate-atropine (LOMOTIL) 2.5-0.025 MG per tablet Take 1 tablet by mouth 4 (four) times daily as needed for diarrhea or loose stools.   . Eszopiclone 3 MG TABS Take 1 tablet (3 mg total) by mouth at bedtime. For insomnia,take immediately before bedtime.  . fentaNYL (DURAGESIC - DOSED MCG/HR) 50 MCG/HR Place 1 patch (50 mcg total) onto the skin every 3 (three) days.  Marland Kitchen FLUoxetine (PROZAC)  40 MG capsule Take 1 capsule (40 mg total) by mouth daily. For depression  . furosemide (LASIX) 20 MG tablet Take 1 tablet (20 mg total) by mouth daily.  Marland Kitchen HYDROcodone-acetaminophen (NORCO) 10-325 MG per tablet Take 1 tablet by mouth every 4 (four) hours as needed for moderate pain.   . iron polysaccharides (NIFEREX) 150 MG capsule Take 1 capsule (150 mg total)  by mouth daily.  . isosorbide mononitrate (IMDUR) 30 MG 24 hr tablet Take 30 mg by mouth daily.   Marland Kitchen LORazepam (ATIVAN) 0.5 MG tablet Take 1 tablet (0.5 mg total) by mouth 2 (two) times daily as needed for anxiety.  . meclizine (ANTIVERT) 25 MG tablet Take 1 tablet (25 mg total) by mouth 3 (three) times daily. (Patient taking differently: Take 25 mg by mouth 3 (three) times daily as needed for dizziness. )  . methocarbamol (ROBAXIN) 500 MG tablet Take 500 mg by mouth every 6 (six) hours as needed for muscle spasms.   . Multiple Vitamin (MULTIVITAMIN) tablet Take 1 tablet by mouth daily. For supplement  . pantoprazole (PROTONIX) 40 MG tablet Take 1 tablet (40 mg total) by mouth daily.  . rosuvastatin (CRESTOR) 20 MG tablet Take 1 tablet (20 mg total) by mouth daily. (Patient taking differently: Take 10-20 mg by mouth every other day. 20 mg one day alternate 10 mg the next day and repeats)    Review of Systems  Constitutional: Negative.   HENT: Negative.   Eyes: Negative.   Respiratory: Negative.   Cardiovascular: Negative.   Gastrointestinal: Negative.   Endocrine: Negative.   Genitourinary: Negative.   Musculoskeletal: Positive for back pain.  Skin: Negative.        Multiple areas of bruising   Knot on right wrist - since Cath.   Allergic/Immunologic: Negative.   Neurological: Negative.   Hematological: Negative.   Psychiatric/Behavioral: Negative.        Objective:   Physical Exam  Constitutional: She is oriented to person, place, and time. No distress.  Tearful, bruised on face, good jaw movement with no complaints with headache  HENT:  Head: Normocephalic.  Right Ear: External ear normal.  Left Ear: External ear normal.  Nose: Nose normal.  Mouth/Throat: Oropharynx is clear and moist. No oropharyngeal exudate.  Status post left orbital fracture and facial and chin contusion, this patient continues to have a lot of bruising around the neck and chin.  Eyes: Conjunctivae and  EOM are normal. Pupils are equal, round, and reactive to light. Right eye exhibits no discharge. Left eye exhibits no discharge. No scleral icterus.  Neck: Normal range of motion. Neck supple. No thyromegaly present.  Cardiovascular: Normal rate, regular rhythm and normal heart sounds.   No murmur heard. Pulmonary/Chest: Effort normal and breath sounds normal. No respiratory distress. She has no wheezes. She has no rales. She exhibits no tenderness.  Abdominal: Soft. Bowel sounds are normal. She exhibits no mass. There is no tenderness. There is no rebound and no guarding.  Musculoskeletal: Normal range of motion. She exhibits tenderness. She exhibits no edema.  The patient has pretty good movement with her back and spine and is using only a cane and was able to sit get up and walk with using the cane. She did not need any assistance.  Lymphadenopathy:    She has no cervical adenopathy.  Neurological: She is alert and oriented to person, place, and time.  Skin: Skin is warm and dry. No rash noted. No erythema.  The patient  has bruising on her right distal forearm around the wrist where the recent cath was done with a resolving lump or not from the hematoma that she had there. She also has bruising around the anterior neck and chin area from the fall that she had recently.  Psychiatric: Her behavior is normal. Judgment and thought content normal.  The patient's mood and affect were somewhat depressed and she admits to this and will try to do better and be more positive about her life.  Nursing note and vitals reviewed.  BP 118/66 mmHg  Pulse 66  Temp(Src) 98.4 F (36.9 C) (Oral)  Ht _0  (1.702 m)  Wt 187 lb (84.823 kg)  BMI 29.28 kg/m2        Assessment & Plan:  1. Metabolic syndrome -Lab work is pending - POCT CBC  2. Vitamin D deficiency -Results and lab work are pending, continue current treatment - POCT CBC - Vit D  25 hydroxy (rtn osteoporosis monitoring)  3.  Gastroesophageal reflux disease, esophagitis presence not specified -Continue pantoprazole - POCT CBC - Hepatic function panel  4. Hyperlipemia -Continue Crestor as doing as this is been well tolerated by her. - POCT CBC - Lipid panel  5. Essential hypertension -Continue current medication for blood pressure - POCT CBC - BMP8+EGFR - Hepatic function panel  6. Lumbar spondylosis with myelopathy -Continue with physical therapy and make all efforts not to have any more falling episodes  7. Cervical spondylosis without myelopathy -Continue with current physical therapy  8. Fracture of orbital floor, blow-out, left, closed, sequela -Follow-up with air nose and throat  9. Depression -Continue Prozac  Meds ordered this encounter  Medications  . amoxicillin (AMOXIL) 500 MG capsule    Sig: Take 1 capsule (500 mg total) by mouth 3 (three) times daily.    Dispense:  21 capsule    Refill:  0   Patient Instructions                       Medicare Annual Wellness Visit  [facility] and the medical providers at [facility] strive to bring you the best medical care.  In doing so we not only want to address your current medical conditions and concerns but also to detect new conditions early and prevent illness, disease and health-related problems.    Medicare offers a yearly Wellness Visit which allows our clinical staff to assess your need for preventative services including immunizations, lifestyle education, counseling to decrease risk of preventable diseases and screening for fall risk and other medical concerns.    This visit is provided free of charge (no copay) for all Medicare recipients. The clinical pharmacists at [facility] have begun to conduct these Wellness Visits which will also include a thorough review of all your medications.    As you primary medical provider recommend that you make an appointment for your Annual Wellness  Visit if you have not done so already this year.  You may set up this appointment before you leave today or you may call back ([phone]) and schedule an appointment.  Please make sure when you call that you mention that you are scheduling your Annual Wellness Visit with the clinical pharmacist so that the appointment may be made for the proper length of time.     Continue current medications. Continue good therapeutic lifestyle changes which include good diet and exercise. Fall precautions discussed with patient. If an FOBT was given today-  please return it to our front desk. If you are over 25 years old - you may need Prevnar 48 or the adult Pneumonia vaccine.  Flu Shots will be available at our office starting mid- September. Please call and schedule a FLU CLINIC APPOINTMENT.   Follow through with stenting procedure on Monday Take antibiotic as directed We will arrange another appointment for you to see the ear nose and throat specialist  regarding the left orbital fracture from the most recent fall Continue to be careful and did not put yourself at risk for falling Have more faith   Arrie Senate MD

## 2015-01-04 ENCOUNTER — Ambulatory Visit (INDEPENDENT_AMBULATORY_CARE_PROVIDER_SITE_OTHER): Payer: 59 | Admitting: Cardiovascular Disease

## 2015-01-04 ENCOUNTER — Encounter: Payer: Self-pay | Admitting: Cardiovascular Disease

## 2015-01-04 VITALS — BP 132/62 | HR 67 | Ht 67.0 in | Wt 187.0 lb

## 2015-01-04 DIAGNOSIS — I209 Angina pectoris, unspecified: Secondary | ICD-10-CM

## 2015-01-04 LAB — LIPID PANEL
Chol/HDL Ratio: 2.6 ratio units (ref 0.0–4.4)
Cholesterol, Total: 158 mg/dL (ref 100–199)
HDL: 61 mg/dL (ref >39)
LDL CALC: 63 mg/dL (ref 0–99)
TRIGLYCERIDES: 171 mg/dL — AB (ref 0–149)
VLDL Cholesterol Cal: 34 mg/dL (ref 5–40)

## 2015-01-04 LAB — HEPATIC FUNCTION PANEL
ALT: 15 IU/L (ref 0–32)
AST: 16 IU/L (ref 0–40)
Albumin: 4.1 g/dL (ref 3.5–4.8)
Alkaline Phosphatase: 110 IU/L (ref 39–117)
Bilirubin, Direct: 0.14 mg/dL (ref 0.00–0.40)
TOTAL PROTEIN: 6.6 g/dL (ref 6.0–8.5)
Total Bilirubin: 0.4 mg/dL (ref 0.0–1.2)

## 2015-01-04 LAB — BMP8+EGFR
BUN/Creatinine Ratio: 19 (ref 11–26)
BUN: 14 mg/dL (ref 8–27)
CHLORIDE: 97 mmol/L (ref 97–108)
CO2: 29 mmol/L (ref 18–29)
Calcium: 9.3 mg/dL (ref 8.7–10.3)
Creatinine, Ser: 0.75 mg/dL (ref 0.57–1.00)
GFR calc Af Amer: 90 mL/min/{1.73_m2} (ref >59)
GFR calc non Af Amer: 78 mL/min/{1.73_m2} (ref >59)
Glucose: 94 mg/dL (ref 65–99)
Potassium: 4.5 mmol/L (ref 3.5–5.2)
Sodium: 139 mmol/L (ref 134–144)

## 2015-01-04 LAB — VITAMIN D 25 HYDROXY (VIT D DEFICIENCY, FRACTURES): Vit D, 25-Hydroxy: 29.9 ng/mL — ABNORMAL LOW (ref 30.0–100.0)

## 2015-01-04 NOTE — Progress Notes (Signed)
Cardiology Office Note   Date:  01/04/2015   ID:  [name] [name], DOB 1941/11/26, MRN [id number]  PCP:  Redge Gainer, MD  Cardiologist:  Loralie Champagne, MD   Chief Complaint  Patient presents with  . Chest Pain     History of Present Illness: [name] [name] is a 74 y.o. female who presents for evaluation of left main CAD. The patient has had chronic chest discomfort. She's had a lot of significant spine problems requiring surgery. She's become somewhat debilitated and walks only with a walker. She is short of breath with very short distances. She describes an almost constant pressure in her chest, the pain is nonradiating. She had a nuclear stress scan in 2014 demonstrating no significant ischemia. She was evaluated for chest pain and paroxysmal atrial fibrillation by Truitt Merle in December 2015. A nuclear scan at that time demonstrated a moderate area of anteroapical and anteroseptal ischemia as well as inferolateral infarct with moderate peri-infarct ischemia. The study was suggestive of multivessel coronary artery disease. The patient's LVEF was gated and calculated at 57%.     Past Medical History  Diagnosis Date  . Obesity   . Circadian rhythm sleep disorder   . CTS (carpal tunnel syndrome)   . Diarrhea   . IBS (irritable bowel syndrome)   . Diverticulitis of colon   . Gastritis   . Esophageal stricture   . Personal history of colonic polyps 10/25/2011 & 12/02/11    not retrieved Dr Lyla Son & tubular adenomas  . Hiatal hernia   . GERD (gastroesophageal reflux disease)   . Obstructive sleep apnea   . Insomnia   . HTN (hypertension)   . Hyperlipidemia   . Depression   . DIASTOLIC HEART FAILURE, CHRONIC 04/04/2009    Qualifier: Diagnosis of  By: Mare Ferrari, RMA, Sherri    . Memory disorder 12/04/2014    Past Surgical History  Procedure Laterality Date  . Carpal tunnel release    . Cholecystectomy  2003  . Total knee arthroplasty      bilateral  . Cataract  extraction Bilateral   . Left heart catheterization with coronary angiogram N/A 12/27/2014    Procedure: LEFT HEART CATHETERIZATION WITH CORONARY ANGIOGRAM;  Surgeon: Larey Dresser, MD;  Location: [facility] CATH LAB;  Service: Cardiovascular;  Laterality: N/A;  . Cardiac catheterization  12/27/2014    Procedure: INTRAVASCULAR PRESSURE WIRE/FFR STUDY;  Surgeon: Larey Dresser, MD;  Location: [facility] CATH LAB;  Service: Cardiovascular;;    Current Outpatient Prescriptions  Medication Sig Dispense Refill  . amLODipine (NORVASC) 2.5 MG tablet Take 1 tablet by mouth  daily. For HTN (Patient taking differently: Take 2.5 mg by mouth daily. ) 90 tablet 3  . amoxicillin (AMOXIL) 500 MG capsule Take 1 capsule (500 mg total) by mouth 3 (three) times daily. 21 capsule 0  . aspirin EC 81 MG tablet Take 81 mg by mouth daily. For prophylasxis    . Calcium Carbonate-Vit D-Min (CALCIUM 1200) 1200-1000 MG-UNIT CHEW Chew 1 each by mouth daily.    . clopidogrel (PLAVIX) 75 MG tablet Take 1 tablet (75 mg total) by mouth daily. 30 tablet 3  . colesevelam (WELCHOL) 625 MG tablet Take 625 mg by mouth daily with breakfast.    . diphenoxylate-atropine (LOMOTIL) 2.5-0.025 MG per tablet Take 1 tablet by mouth 4 (four) times daily as needed for diarrhea or loose stools.     . Eszopiclone 3 MG TABS Take 1 tablet (3 mg total) by  mouth at bedtime. For insomnia,take immediately before bedtime. 90 tablet 1  . fentaNYL (DURAGESIC - DOSED MCG/HR) 50 MCG/HR Place 1 patch (50 mcg total) onto the skin every 3 (three) days. 10 patch 0  . FLUoxetine (PROZAC) 40 MG capsule Take 1 capsule (40 mg total) by mouth daily. For depression 90 capsule 3  . furosemide (LASIX) 20 MG tablet Take 1 tablet (20 mg total) by mouth daily. 90 tablet 3  . HYDROcodone-acetaminophen (NORCO) 10-325 MG per tablet Take 1 tablet by mouth every 4 (four) hours as needed for moderate pain.     . iron polysaccharides (NIFEREX) 150 MG capsule Take 1 capsule (150 mg total) by  mouth daily. 90 capsule 3  . isosorbide mononitrate (IMDUR) 30 MG 24 hr tablet Take 30 mg by mouth daily.     Marland Kitchen LORazepam (ATIVAN) 0.5 MG tablet Take 1 tablet (0.5 mg total) by mouth 2 (two) times daily as needed for anxiety. 60 tablet 1  . meclizine (ANTIVERT) 25 MG tablet Take 1 tablet (25 mg total) by mouth 3 (three) times daily. (Patient taking differently: Take 25 mg by mouth 3 (three) times daily as needed for dizziness. ) 30 tablet 0  . methocarbamol (ROBAXIN) 500 MG tablet Take 500 mg by mouth every 6 (six) hours as needed for muscle spasms.     . Multiple Vitamin (MULTIVITAMIN) tablet Take 1 tablet by mouth daily. For supplement    . pantoprazole (PROTONIX) 40 MG tablet Take 1 tablet (40 mg total) by mouth daily. 30 tablet 3  . rosuvastatin (CRESTOR) 20 MG tablet Take 1 tablet (20 mg total) by mouth daily. (Patient taking differently: Take 10-20 mg by mouth every other day. 20 mg one day alternate 10 mg the next day and repeats) 90 tablet 3   No current facility-administered medications for this visit.    Allergies:   Cymbalta; Morphine and related; Oxycodone-acetaminophen; Valium; Altace; Floxin; Lipitor; Lovaza; Pravachol; Trilipix; Zetia; and Zocor   Social History:  The patient  reports that she has never smoked. She has never used smokeless tobacco. She reports that she drinks alcohol. She reports that she does not use illicit drugs.   Family History:  The patient's  family history includes Colon cancer in her sister and sister; Coronary artery disease in her brother, father, and mother; Emphysema in her brother and sister; Peripheral vascular disease in her father; Sleep apnea in her son. There is no history of Dementia.    ROS:  Please see the history of present illness.  Otherwise, review of systems is positive for hearing loss, shortness of breath, diarrhea, depression, back pain, muscle pain, dizziness, chest pressure, snoring, constipation, anxiety, joint swelling, and balance  problems.  All other systems are reviewed and negative.    PHYSICAL EXAM: VS:  BP 132/62 mmHg  Pulse 67  Ht 5\' 7"  (1.702 m)  Wt 187 lb (84.823 kg)  BMI 29.28 kg/m2  SpO2 94% , BMI Body mass index is 29.28 kg/(m^2). GEN: Somewhat frail elderly woman, in no acute distress HEENT: normal except for healing ecchymoses Neck: no JVD, carotid bruits, or masses Cardiac: RRR; no murmurs, rubs, or gallops,no edema  Respiratory:  clear to auscultation bilaterally, normal work of breathing GI: soft, nontender, nondistended MS: no deformity or atrophy Skin: warm and dry, no rash Psych: Tearful at times, flat affect  EKG:  EKG is not ordered today.  Recent Labs: 11/28/2014: Pro B Natriuretic peptide (BNP) 365.9 12/04/2014: TSH 3.120 01/01/2015: Platelets 205.0 01/03/2015:  ALT 15; BUN 14; Creatinine 0.75; Hemoglobin 12.3; Potassium 4.5; Sodium 139   Lipid Panel     Component Value Date/Time   CHOL 138 08/21/2014 1141   TRIG 171* 01/03/2015 1310   TRIG 171* 08/21/2014 1141   TRIG 239* 05/11/2013 1536   HDL 61 01/03/2015 1310   HDL 54 08/21/2014 1141   CHOLHDL 2.6 01/03/2015 1310   LDLCALC 63 01/03/2015 1310   LDLCALC 50 08/21/2014 1141   LDLCALC 65 05/11/2013 1536      Wt Readings from Last 3 Encounters:  01/04/15 187 lb (84.823 kg)  01/03/15 187 lb (84.823 kg)  12/28/14 187 lb (84.823 kg)     Other studies Reviewed: Lexiscan Myoview stress test Overall Impression: Intermediate risk stress nuclear study Moderate area of anteroapical and anteroseptal ischemia Also inferolateral wall infarct with moderate peri infarct ischemia Study suggests multi vessel diseae.  LV Ejection Fraction: 57%. LV Wall Motion: NL LV Function; NL Wall Motion  Cardiac catheterization 12/27/2014: Final Conclusions: Calcified distal left main, in some views concerning for significant (>50% stenosis). Ostial approximate 70% LCx stenosis. Will plan FFR of the distal left main (Dr Burt Knack).   Pressure  wire analysis 12/27/2014: Conclusions:  Hemodynamically significant distal left main/ostial circumflex stenosis. Borderline significant stenosis extending into the LAD.  Recommendations: Consideration of CABG versus PCI for revascularization. If PCI were chosen, I think the patient would require rotational atherectomy of the left main into the circumflex followed by bifurcation stenting. Would recommend surgical evaluation and if the patient is considered to be a poor candidate for surgery, PCI would be reasonable.  ASSESSMENT AND PLAN: 1.  Coronary artery disease, native vessel, with ischemic chest pain. The patient has it a stress nuclear scan demonstrating multivessel territory ischemia. She has severe distal left main stem stenosis with a complex heavily calcified lesion in the distal left main extending into the ostium of the left circumflex. I have personally reviewed her cardiac catheterization films as well as the results of her pressure wire analysis and nuclear scan. Her antianginal program includes amlodipine and isosorbide. She is now treated appropriately with aspirin and Plavix in anticipation of PCI. She has been evaluated by cardiac surgery and was declined because of her poor functional capacity. I agree that she would have a very difficult recovery from surgery and may never return to her current functional capacity which is already quite limited. I have had a lengthy discussion with the patient and her daughter today about treatment options. This includes palliative medical therapy versus high risk PCI. Considering the functional significance of her distal left main stenosis, PCI is certainly appropriate. I think the best result will be obtained with rotational atherectomy followed by bifurcation stenting. I reviewed our specific plans.  I have reviewed the specific risks, indications, and alternatives to PCI. The patient understands that risks include bleeding, vascular injury,  myocardial infarction, emergency CABG, stroke, acute kidney injury requiring hemodialysis, radiation injury, and death. Despite the higher risk nature of the specific procedure, the overall risk of a successful PCI without major adverse event in this setting remains greater than 90%. The patient understands this and agrees to proceed.  Current medicines are reviewed with the patient today.  The patient does not have concerns regarding medicines.  The following changes have been made:   Continue dual antiplatelet therapy with aspirin and Plavix. PCI as planned above. Patient instructed to take all of her medications prior to arrival at the hospital for her PCI procedure.  Labs/  tests ordered today include: None  No orders of the defined types were placed in this encounter.    Disposition:   Elective PCI Monday, January 18th.  Signed, Sherren Mocha, MD  01/04/2015 2:26 PM    [facility] [place], [place], [place]  [address] Phone: [phone]; Fax: [phone]

## 2015-01-06 ENCOUNTER — Other Ambulatory Visit: Payer: Self-pay | Admitting: Cardiovascular Disease

## 2015-01-06 DIAGNOSIS — I209 Angina pectoris, unspecified: Secondary | ICD-10-CM

## 2015-01-06 MED ORDER — DIAZEPAM 2 MG PO TABS: 5.0000 mg | ORAL_TABLET | ORAL | Status: AC

## 2015-01-07 ENCOUNTER — Encounter (HOSPITAL_COMMUNITY): Payer: Self-pay | Admitting: General Practice

## 2015-01-07 ENCOUNTER — Encounter (HOSPITAL_COMMUNITY)
Admission: RE | Disposition: A | Discharge status: Home / Self Care | Payer: Medicare Other | Source: Ambulatory Visit | Attending: Cardiovascular Disease

## 2015-01-07 ENCOUNTER — Ambulatory Visit (HOSPITAL_COMMUNITY)
Admission: RE | Admit: 2015-01-07 | Discharge: 2015-01-08 | Disposition: A | Discharge status: Home / Self Care | Payer: Medicare Other | Source: Ambulatory Visit | Attending: Cardiovascular Disease | Admitting: Cardiovascular Disease

## 2015-01-07 DIAGNOSIS — I10 Essential (primary) hypertension: Secondary | ICD-10-CM | POA: Diagnosis not present

## 2015-01-07 DIAGNOSIS — D472 Monoclonal gammopathy: Secondary | ICD-10-CM | POA: Diagnosis not present

## 2015-01-07 DIAGNOSIS — I471 Supraventricular tachycardia: Secondary | ICD-10-CM | POA: Insufficient documentation

## 2015-01-07 DIAGNOSIS — G4733 Obstructive sleep apnea (adult) (pediatric): Secondary | ICD-10-CM | POA: Insufficient documentation

## 2015-01-07 DIAGNOSIS — I5032 Chronic diastolic (congestive) heart failure: Secondary | ICD-10-CM | POA: Insufficient documentation

## 2015-01-07 DIAGNOSIS — F419 Anxiety disorder, unspecified: Secondary | ICD-10-CM | POA: Insufficient documentation

## 2015-01-07 DIAGNOSIS — I209 Angina pectoris, unspecified: Secondary | ICD-10-CM

## 2015-01-07 DIAGNOSIS — E785 Hyperlipidemia, unspecified: Secondary | ICD-10-CM | POA: Diagnosis not present

## 2015-01-07 DIAGNOSIS — F329 Major depressive disorder, single episode, unspecified: Secondary | ICD-10-CM | POA: Diagnosis not present

## 2015-01-07 DIAGNOSIS — I25110 Atherosclerotic heart disease of native coronary artery with unstable angina pectoris: Secondary | ICD-10-CM | POA: Insufficient documentation

## 2015-01-07 DIAGNOSIS — Z7982 Long term (current) use of aspirin: Secondary | ICD-10-CM | POA: Diagnosis not present

## 2015-01-07 DIAGNOSIS — K219 Gastro-esophageal reflux disease without esophagitis: Secondary | ICD-10-CM | POA: Diagnosis not present

## 2015-01-07 DIAGNOSIS — I480 Paroxysmal atrial fibrillation: Secondary | ICD-10-CM | POA: Insufficient documentation

## 2015-01-07 DIAGNOSIS — Z981 Arthrodesis status: Secondary | ICD-10-CM

## 2015-01-07 DIAGNOSIS — I25118 Atherosclerotic heart disease of native coronary artery with other forms of angina pectoris: Secondary | ICD-10-CM | POA: Diagnosis not present

## 2015-01-07 DIAGNOSIS — Z86010 Personal history of colonic polyps: Secondary | ICD-10-CM | POA: Diagnosis not present

## 2015-01-07 DIAGNOSIS — K589 Irritable bowel syndrome without diarrhea: Secondary | ICD-10-CM | POA: Insufficient documentation

## 2015-01-07 DIAGNOSIS — D649 Anemia, unspecified: Secondary | ICD-10-CM

## 2015-01-07 DIAGNOSIS — I4821 Permanent atrial fibrillation: Secondary | ICD-10-CM | POA: Diagnosis present

## 2015-01-07 HISTORY — DX: Bradycardia, unspecified: R00.1

## 2015-01-07 HISTORY — PX: PERCUTANEOUS CORONARY ROTOBLATOR INTERVENTION (PCI-R): SHX6015

## 2015-01-07 HISTORY — DX: Monoclonal gammopathy: D47.2

## 2015-01-07 HISTORY — PX: CORONARY ANGIOPLASTY WITH STENT PLACEMENT: SHX49

## 2015-01-07 HISTORY — DX: Supraventricular tachycardia: I47.1

## 2015-01-07 HISTORY — DX: Orthostatic hypotension: I95.1

## 2015-01-07 HISTORY — PX: PERCUTANEOUS CORONARY ROTOBLATOR INTERVENTION (PCI-R): SHX5484

## 2015-01-07 HISTORY — DX: Repeated falls: R29.6

## 2015-01-07 HISTORY — DX: Atherosclerotic heart disease of native coronary artery without angina pectoris: I25.10

## 2015-01-07 HISTORY — DX: Other chronic pain: G89.29

## 2015-01-07 HISTORY — DX: Anxiety disorder, unspecified: F41.9

## 2015-01-07 HISTORY — DX: Unspecified osteoarthritis, unspecified site: M19.90

## 2015-01-07 HISTORY — DX: Pneumonia, unspecified organism: J18.9

## 2015-01-07 HISTORY — DX: Chronic diastolic (congestive) heart failure: I50.32

## 2015-01-07 HISTORY — DX: Personal history of other medical treatment: Z92.89

## 2015-01-07 HISTORY — DX: Other supraventricular tachycardia: I47.19

## 2015-01-07 HISTORY — DX: Cerebral infarction, unspecified: I63.9

## 2015-01-07 HISTORY — DX: Anemia, unspecified: D64.9

## 2015-01-07 HISTORY — DX: Dorsalgia, unspecified: M54.9

## 2015-01-07 LAB — POCT ACTIVATED CLOTTING TIME: Activated Clotting Time: 429 seconds

## 2015-01-07 SURGERY — PERCUTANEOUS CORONARY ROTOBLATOR INTERVENTION (PCI-R)
Anesthesia: LOCAL

## 2015-01-07 MED ORDER — NITROGLYCERIN 0.2 MG/ML ON CALL CATH LAB
INTRAVENOUS | Status: AC
  Filled 2015-01-07: qty 1

## 2015-01-07 MED ORDER — ONDANSETRON HCL 4 MG/2ML IJ SOLN: 4.0000 mg | Freq: Four times a day (QID) | INTRAMUSCULAR | Status: DC | PRN

## 2015-01-07 MED ORDER — HEPARIN (PORCINE) IN NACL 2-0.9 UNIT/ML-% IJ SOLN
INTRAMUSCULAR | Status: AC
Start: 2015-01-07 — End: 2015-01-07
  Filled 2015-01-07: qty 1000

## 2015-01-07 MED ORDER — ADULT MULTIVITAMIN W/MINERALS CH
1.0000 | ORAL_TABLET | Freq: Every day | ORAL | Status: DC
  Administered 2015-01-08: 10:00:00 1 via ORAL
  Filled 2015-01-07: qty 1

## 2015-01-07 MED ORDER — LIDOCAINE-EPINEPHRINE 1 %-1:100000 IJ SOLN
INTRAMUSCULAR | Status: AC
  Filled 2015-01-07: qty 1

## 2015-01-07 MED ORDER — AMLODIPINE BESYLATE 2.5 MG PO TABS
2.5000 mg | ORAL_TABLET | Freq: Every day | ORAL | Status: DC
  Administered 2015-01-08: 10:00:00 2.5 mg via ORAL
  Filled 2015-01-07: qty 1

## 2015-01-07 MED ORDER — SODIUM CHLORIDE 0.9 % IV SOLN
0.2500 mg/kg/h | INTRAVENOUS | Status: AC
  Administered 2015-01-07: 0.25 mg/kg/h via INTRAVENOUS
  Filled 2015-01-07: qty 250

## 2015-01-07 MED ORDER — ZOLPIDEM TARTRATE 5 MG PO TABS
5.0000 mg | ORAL_TABLET | Freq: Every evening | ORAL | Status: DC | PRN
  Administered 2015-01-07: 21:00:00 5 mg via ORAL
  Filled 2015-01-07: qty 1

## 2015-01-07 MED ORDER — ACETAMINOPHEN 325 MG PO TABS: 650.0000 mg | ORAL_TABLET | ORAL | Status: DC | PRN

## 2015-01-07 MED ORDER — SODIUM CHLORIDE 0.9 % IJ SOLN: 3.0000 mL | INTRAMUSCULAR | Status: DC | PRN

## 2015-01-07 MED ORDER — BIVALIRUDIN 250 MG IV SOLR
INTRAVENOUS | Status: AC
  Filled 2015-01-07: qty 250

## 2015-01-07 MED ORDER — HEPARIN (PORCINE) IN NACL 2-0.9 UNIT/ML-% IJ SOLN
INTRAMUSCULAR | Status: AC
  Filled 2015-01-07: qty 500

## 2015-01-07 MED ORDER — SODIUM CHLORIDE 0.9 % IV SOLN: 1.0000 mL/kg/h | INTRAVENOUS | Status: AC

## 2015-01-07 MED ORDER — ISOSORBIDE MONONITRATE ER 30 MG PO TB24
30.0000 mg | ORAL_TABLET | Freq: Every day | ORAL | Status: DC
  Administered 2015-01-08: 30 mg via ORAL
  Filled 2015-01-07: qty 1

## 2015-01-07 MED ORDER — AMOXICILLIN 500 MG PO CAPS
500.0000 mg | ORAL_CAPSULE | Freq: Three times a day (TID) | ORAL | Status: DC
  Administered 2015-01-07 – 2015-01-08 (×3): 500 mg via ORAL
  Filled 2015-01-07 (×5): qty 1

## 2015-01-07 MED ORDER — MECLIZINE HCL 25 MG PO TABS
25.0000 mg | ORAL_TABLET | Freq: Three times a day (TID) | ORAL | Status: DC | PRN
  Filled 2015-01-07: qty 1

## 2015-01-07 MED ORDER — FUROSEMIDE 20 MG PO TABS
20.0000 mg | ORAL_TABLET | Freq: Every day | ORAL | Status: DC
  Filled 2015-01-07 (×2): qty 1

## 2015-01-07 MED ORDER — LIDOCAINE HCL (PF) 1 % IJ SOLN
INTRAMUSCULAR | Status: AC
  Filled 2015-01-07: qty 30

## 2015-01-07 MED ORDER — SODIUM CHLORIDE 0.9 % IV SOLN: 250.0000 mL | INTRAVENOUS | Status: DC | PRN

## 2015-01-07 MED ORDER — SODIUM CHLORIDE 0.9 % IJ SOLN: 3.0000 mL | Freq: Two times a day (BID) | INTRAMUSCULAR | Status: DC

## 2015-01-07 MED ORDER — POLYSACCHARIDE IRON COMPLEX 150 MG PO CAPS
150.0000 mg | ORAL_CAPSULE | Freq: Every day | ORAL | Status: DC
  Administered 2015-01-08: 150 mg via ORAL
  Filled 2015-01-07 (×2): qty 1

## 2015-01-07 MED ORDER — FENTANYL 50 MCG/HR TD PT72: 50.0000 ug | MEDICATED_PATCH | TRANSDERMAL | Status: DC

## 2015-01-07 MED ORDER — DIPHENOXYLATE-ATROPINE 2.5-0.025 MG PO TABS: 1.0000 | ORAL_TABLET | Freq: Four times a day (QID) | ORAL | Status: DC | PRN

## 2015-01-07 MED ORDER — ASPIRIN 81 MG PO CHEW: 81.0000 mg | CHEWABLE_TABLET | ORAL | Status: DC

## 2015-01-07 MED ORDER — FLUOXETINE HCL 20 MG PO CAPS
40.0000 mg | ORAL_CAPSULE | Freq: Every day | ORAL | Status: DC
  Administered 2015-01-08: 40 mg via ORAL
  Filled 2015-01-07: qty 2

## 2015-01-07 MED ORDER — SODIUM CHLORIDE 0.9 % IV SOLN
INTRAVENOUS | Status: DC
  Administered 2015-01-07: 12:00:00 via INTRAVENOUS

## 2015-01-07 MED ORDER — ZOLPIDEM TARTRATE 5 MG PO TABS: 5.0000 mg | ORAL_TABLET | Freq: Every evening | ORAL | Status: DC | PRN

## 2015-01-07 MED ORDER — METHOCARBAMOL 500 MG PO TABS: 500.0000 mg | ORAL_TABLET | Freq: Four times a day (QID) | ORAL | Status: DC | PRN

## 2015-01-07 MED ORDER — MECLIZINE HCL 25 MG PO TABS: 25.0000 mg | ORAL_TABLET | Freq: Three times a day (TID) | ORAL | Status: DC

## 2015-01-07 MED ORDER — MIDAZOLAM HCL 2 MG/2ML IJ SOLN
INTRAMUSCULAR | Status: AC
  Filled 2015-01-07: qty 2

## 2015-01-07 MED ORDER — ASPIRIN EC 81 MG PO TBEC
81.0000 mg | DELAYED_RELEASE_TABLET | Freq: Every day | ORAL | Status: DC
  Administered 2015-01-08: 81 mg via ORAL
  Filled 2015-01-07: qty 1

## 2015-01-07 MED ORDER — ROSUVASTATIN CALCIUM 20 MG PO TABS
20.0000 mg | ORAL_TABLET | Freq: Every day | ORAL | Status: DC
  Administered 2015-01-08: 10:00:00 20 mg via ORAL
  Filled 2015-01-07: qty 1

## 2015-01-07 MED ORDER — FENTANYL CITRATE 0.05 MG/ML IJ SOLN
INTRAMUSCULAR | Status: AC
  Filled 2015-01-07: qty 2

## 2015-01-07 MED ORDER — VERAPAMIL HCL 2.5 MG/ML IV SOLN
INTRAVENOUS | Status: AC
  Filled 2015-01-07: qty 2

## 2015-01-07 MED ORDER — HEPARIN SODIUM (PORCINE) 1000 UNIT/ML IJ SOLN
INTRAMUSCULAR | Status: AC
  Filled 2015-01-07: qty 1

## 2015-01-07 MED ORDER — NITROGLYCERIN 1 MG/10 ML FOR IR/CATH LAB
INTRA_ARTERIAL | Status: AC
  Filled 2015-01-07: qty 10

## 2015-01-07 MED ORDER — HYDROCODONE-ACETAMINOPHEN 10-325 MG PO TABS
1.0000 | ORAL_TABLET | ORAL | Status: DC | PRN
  Administered 2015-01-07: 19:00:00 1 via ORAL
  Filled 2015-01-07: qty 1

## 2015-01-07 MED ORDER — LORAZEPAM 0.5 MG PO TABS: 0.5000 mg | ORAL_TABLET | Freq: Two times a day (BID) | ORAL | Status: DC | PRN

## 2015-01-07 MED ORDER — CLOPIDOGREL BISULFATE 75 MG PO TABS
75.0000 mg | ORAL_TABLET | Freq: Every day | ORAL | Status: DC
  Administered 2015-01-07 – 2015-01-08 (×2): 75 mg via ORAL
  Filled 2015-01-07 (×2): qty 1

## 2015-01-07 MED ORDER — ENSURE COMPLETE PO LIQD
237.0000 mL | Freq: Two times a day (BID) | ORAL | Status: DC
  Administered 2015-01-08: 237 mL via ORAL
  Filled 2015-01-07 (×5): qty 237

## 2015-01-07 MED ORDER — PANTOPRAZOLE SODIUM 40 MG PO TBEC
40.0000 mg | DELAYED_RELEASE_TABLET | Freq: Every day | ORAL | Status: DC
  Administered 2015-01-08: 10:00:00 40 mg via ORAL
  Filled 2015-01-07: qty 1

## 2015-01-07 NOTE — CV Procedure (Addendum)
CARDIAC CATH NOTE  Name: [name] [name] Treat MRN: [id number] DOB: [birth year]  Procedure: Temporary transvenous pacemaker placement, rotational atherectomy, PTCA and stenting of the ostial LCx and left main into the LAD (crush technique), and IVUS of the LAD/Left main  Indication: CCS Class 3 angina with severe left main/LCx stenosis, moderate risk Myoview, turned down for CABG because of very poor functional capacity. Previous FFR 0.66 LCx and 0.82 LAD  Procedural Details: The right groin was prepped, draped, and anesthetized with 1% lidocaine. Using the modified Seldinger technique, a 7 Fr sheath was introduced into the right femoral artery.  A 6 Fr sheath was introduced into the right femoral vein. A balloontipped temporary transvenous pacemaker was inserted into the right ventricular apex. The patient had been adequately pretreated with aspirin and clopidogrel. Weight-based bivalirudin was given for anticoagulation. Once a therapeutic ACT was achieved, a 7 [place] XB 3.0 cm guide catheter was inserted.  A long cougar coronary guidewire was used to cross the lesion and was advanced into the distal left circumflex.  This was changed out for a Rota-floppy wire over an over-the-wire balloon. Rotational atherectomy was then performed with a 1.5 mm bur. The patient tolerated this well. The Rota-floppy wire was again changed out back to the long cougar wire. The lesion was predilated with a 2.5 mm balloon.  A short cougar wire was then advanced into the LAD. Because of the patient's anatomic considerations, I felt that mini-crush would the best bifurcational technique. She had severe calcification and ostial stenosis of the left circumflex. There was significant vessel mismatch from her large left mainstem compared with the left circumflex. A 2.75 x 12 mm Synergy DES was positioned across the LCx ostium. A 4.0x16 mm Synergy DES was positioned from the proximal LAD back to the mid shaft of the left mainstem. The  circumflex stent was first deployed at 12 atm. The stent balloon was then pulled back and the LAD/left main stent was deployed at 14 atm. The trapped circumflex wire was left in place. A whisper wire was used to rewire the circumflex. The trapped cougar wire and the circumflex was then removed. The stent struts were dilated into the circumflex with a 1.5 mm balloon. A 3.0 mm noncompliant balloon was then advanced from the left mainstem into the circumflex box. A 4.0 mm noncompliant balloon was advanced from the left mainstem into the LAD. Simultaneous kissing balloons were inflated in both vessels extending back into the left main. The patient tolerated this well. The angiographic result was excellent with 0% residual stenosis at the left main site in the left circumflex site. Intravascular ultrasound was then performed from the LAD back through the left mainstem using mechanical pullback. This confirmed good stent apposition and expansion throughout. Final angiographic images were taken. The patient tolerated the procedure well.  Femoral hemostasis was achieved with a Perclose device. Initially the device appeared to achieve hemostasis. However, after a few minutes of manual pressure to control tract oozing, there was more pulsatile flow noted. Device failure was suspected. A Femostop was placed. The patient was transferred to the post catheterization recovery area for further monitoring.  Lesion Data: Lesion 1 Vessel: LCx/ostium Percent stenosis (pre): 80 TIMI-flow (pre):  3 Stent:  2.75x12 mm Synergy DES Percent stenosis (post): 0 TIMI-flow (post): 3  Lesion 2 Vessel: Left main/distal Percent stenosis (pre): 70 TIMI-flow (pre):  3 Stent:  4.0 x 16 mm Synergy DES Percent stenosis (post): 0 TIMI-flow (post): 3 Contrast: 135 cc  Omnipaque  Radiation dose/Fluoro time: 22.2 minutes  Estimated Blood Loss: minimal  Complications: failed femoral artery closure device (Femstop  placed)  Conclusions:  Successful complex bifurcation stenting of the distal left main and ostial circumflex with adjunctive rotational atherectomy, guided by intravascular ultrasound  Recommendations: DAPT at least 12 months and favor long-term as tolerated  Sherren Mocha MD, [facility] 01/07/2015, 2:43 PM

## 2015-01-07 NOTE — Interval H&P Note (Signed)
History and Physical Interval Note:  01/07/2015 12:21 PM  [name] [name]  has presented today for surgery, with the diagnosis of known cad  The various methods of treatment have been discussed with the patient and family. After consideration of risks, benefits and other options for treatment, the patient has consented to  Procedure(s): PERCUTANEOUS CORONARY ROTOBLATOR INTERVENTION (PCI-R) (N/A) as a surgical intervention .  The patient's history has been reviewed, patient examined, no change in status, stable for surgery.  I have reviewed the patient's chart and labs.  Questions were answered to the patient's satisfaction.    Cath Lab Visit (complete for each Cath Lab visit)  Clinical Evaluation Leading to the Procedure:   ACS: No.  Non-ACS:    Anginal Classification: CCS III  Anti-ischemic medical therapy: Maximal Therapy (2 or more classes of medications)  Non-Invasive Test Results: Intermediate-risk stress test findings: cardiac mortality 1-3%/year  Prior CABG: No previous CABG       Sherren Mocha

## 2015-01-07 NOTE — H&P (View-Only) (Signed)
Cardiology Office Note   Date:  01/04/2015   ID:  [name] [name], DOB 12-11-1941, MRN [id number]  PCP:  [name] Gainer, MD  Cardiologist:  [name] Champagne, MD   Chief Complaint  Patient presents with  . Chest Pain     History of Present Illness: [name] [name] is a 73 y.o. female who presents for evaluation of left main CAD. The patient has had chronic chest discomfort. She's had a lot of significant spine problems requiring surgery. She's become somewhat debilitated and walks only with a walker. She is short of breath with very short distances. She describes an almost constant pressure in her chest, the pain is nonradiating. She had a nuclear stress scan in 2014 demonstrating no significant ischemia. She was evaluated for chest pain and paroxysmal atrial fibrillation by Truitt Merle in December 2015. A nuclear scan at that time demonstrated a moderate area of anteroapical and anteroseptal ischemia as well as inferolateral infarct with moderate peri-infarct ischemia. The study was suggestive of multivessel coronary artery disease. The patient's LVEF was gated and calculated at 57%.     Past Medical History  Diagnosis Date  . Obesity   . Circadian rhythm sleep disorder   . CTS (carpal tunnel syndrome)   . Diarrhea   . IBS (irritable bowel syndrome)   . Diverticulitis of colon   . Gastritis   . Esophageal stricture   . Personal history of colonic polyps 10/25/2011 & 12/02/11    not retrieved Dr Lyla Son & tubular adenomas  . Hiatal hernia   . GERD (gastroesophageal reflux disease)   . Obstructive sleep apnea   . Insomnia   . HTN (hypertension)   . Hyperlipidemia   . Depression   . DIASTOLIC HEART FAILURE, CHRONIC 04/04/2009    Qualifier: Diagnosis of  By: Mare Ferrari, RMA, Sherri    . Memory disorder 12/04/2014    Past Surgical History  Procedure Laterality Date  . Carpal tunnel release    . Cholecystectomy  2003  . Total knee arthroplasty      bilateral  . Cataract  extraction Bilateral   . Left heart catheterization with coronary angiogram N/A 12/27/2014    Procedure: LEFT HEART CATHETERIZATION WITH CORONARY ANGIOGRAM;  Surgeon: [name] Dresser, MD;  Location: [facility] CATH LAB;  Service: Cardiovascular;  Laterality: N/A;  . Cardiac catheterization  12/27/2014    Procedure: INTRAVASCULAR PRESSURE WIRE/FFR STUDY;  Surgeon: [name] Dresser, MD;  Location: [facility] CATH LAB;  Service: Cardiovascular;;    Current Outpatient Prescriptions  Medication Sig Dispense Refill  . amLODipine (NORVASC) 2.5 MG tablet Take 1 tablet by mouth  daily. For HTN (Patient taking differently: Take 2.5 mg by mouth daily. ) 90 tablet 3  . amoxicillin (AMOXIL) 500 MG capsule Take 1 capsule (500 mg total) by mouth 3 (three) times daily. 21 capsule 0  . aspirin EC 81 MG tablet Take 81 mg by mouth daily. For prophylasxis    . Calcium Carbonate-Vit D-Min (CALCIUM 1200) 1200-1000 MG-UNIT CHEW Chew 1 each by mouth daily.    . clopidogrel (PLAVIX) 75 MG tablet Take 1 tablet (75 mg total) by mouth daily. 30 tablet 3  . colesevelam (WELCHOL) 625 MG tablet Take 625 mg by mouth daily with breakfast.    . diphenoxylate-atropine (LOMOTIL) 2.5-0.025 MG per tablet Take 1 tablet by mouth 4 (four) times daily as needed for diarrhea or loose stools.     . Eszopiclone 3 MG TABS Take 1 tablet (3 mg total) by  mouth at bedtime. For insomnia,take immediately before bedtime. 90 tablet 1  . fentaNYL (DURAGESIC - DOSED MCG/HR) 50 MCG/HR Place 1 patch (50 mcg total) onto the skin every 3 (three) days. 10 patch 0  . FLUoxetine (PROZAC) 40 MG capsule Take 1 capsule (40 mg total) by mouth daily. For depression 90 capsule 3  . furosemide (LASIX) 20 MG tablet Take 1 tablet (20 mg total) by mouth daily. 90 tablet 3  . HYDROcodone-acetaminophen (NORCO) 10-325 MG per tablet Take 1 tablet by mouth every 4 (four) hours as needed for moderate pain.     . iron polysaccharides (NIFEREX) 150 MG capsule Take 1 capsule (150 mg total) by  mouth daily. 90 capsule 3  . isosorbide mononitrate (IMDUR) 30 MG 24 hr tablet Take 30 mg by mouth daily.     Marland Kitchen LORazepam (ATIVAN) 0.5 MG tablet Take 1 tablet (0.5 mg total) by mouth 2 (two) times daily as needed for anxiety. 60 tablet 1  . meclizine (ANTIVERT) 25 MG tablet Take 1 tablet (25 mg total) by mouth 3 (three) times daily. (Patient taking differently: Take 25 mg by mouth 3 (three) times daily as needed for dizziness. ) 30 tablet 0  . methocarbamol (ROBAXIN) 500 MG tablet Take 500 mg by mouth every 6 (six) hours as needed for muscle spasms.     . Multiple Vitamin (MULTIVITAMIN) tablet Take 1 tablet by mouth daily. For supplement    . pantoprazole (PROTONIX) 40 MG tablet Take 1 tablet (40 mg total) by mouth daily. 30 tablet 3  . rosuvastatin (CRESTOR) 20 MG tablet Take 1 tablet (20 mg total) by mouth daily. (Patient taking differently: Take 10-20 mg by mouth every other day. 20 mg one day alternate 10 mg the next day and repeats) 90 tablet 3   No current facility-administered medications for this visit.    Allergies:   Cymbalta; Morphine and related; Oxycodone-acetaminophen; Valium; Altace; Floxin; Lipitor; Lovaza; Pravachol; Trilipix; Zetia; and Zocor   Social History:  The patient  reports that she has never smoked. She has never used smokeless tobacco. She reports that she drinks alcohol. She reports that she does not use illicit drugs.   Family History:  The patient's  family history includes Colon cancer in her sister and sister; Coronary artery disease in her brother, father, and mother; Emphysema in her brother and sister; Peripheral vascular disease in her father; Sleep apnea in her son. There is no history of Dementia.    ROS:  Please see the history of present illness.  Otherwise, review of systems is positive for hearing loss, shortness of breath, diarrhea, depression, back pain, muscle pain, dizziness, chest pressure, snoring, constipation, anxiety, joint swelling, and balance  problems.  All other systems are reviewed and negative.    PHYSICAL EXAM: VS:  BP 132/62 mmHg  Pulse 67  Ht 5\' 7"  (1.702 m)  Wt 187 lb (84.823 kg)  BMI 29.28 kg/m2  SpO2 94% , BMI Body mass index is 29.28 kg/(m^2). GEN: Somewhat frail elderly woman, in no acute distress HEENT: normal except for healing ecchymoses Neck: no JVD, carotid bruits, or masses Cardiac: RRR; no murmurs, rubs, or gallops,no edema  Respiratory:  clear to auscultation bilaterally, normal work of breathing GI: soft, nontender, nondistended MS: no deformity or atrophy Skin: warm and dry, no rash Psych: Tearful at times, flat affect  EKG:  EKG is not ordered today.  Recent Labs: 11/28/2014: Pro B Natriuretic peptide (BNP) 365.9 12/04/2014: TSH 3.120 01/01/2015: Platelets 205.0 01/03/2015:  ALT 15; BUN 14; Creatinine 0.75; Hemoglobin 12.3; Potassium 4.5; Sodium 139   Lipid Panel     Component Value Date/Time   CHOL 138 08/21/2014 1141   TRIG 171* 01/03/2015 1310   TRIG 171* 08/21/2014 1141   TRIG 239* 05/11/2013 1536   HDL 61 01/03/2015 1310   HDL 54 08/21/2014 1141   CHOLHDL 2.6 01/03/2015 1310   LDLCALC 63 01/03/2015 1310   LDLCALC 50 08/21/2014 1141   LDLCALC 65 05/11/2013 1536      Wt Readings from Last 3 Encounters:  01/04/15 187 lb (84.823 kg)  01/03/15 187 lb (84.823 kg)  12/28/14 187 lb (84.823 kg)     Other studies Reviewed: Lexiscan Myoview stress test Overall Impression: Intermediate risk stress nuclear study Moderate area of anteroapical and anteroseptal ischemia Also inferolateral wall infarct with moderate peri infarct ischemia Study suggests multi vessel diseae.  LV Ejection Fraction: 57%. LV Wall Motion: NL LV Function; NL Wall Motion  Cardiac catheterization 12/27/2014: Final Conclusions: Calcified distal left main, in some views concerning for significant (>50% stenosis). Ostial approximate 70% LCx stenosis. Will plan FFR of the distal left main (Dr Burt Knack).   Pressure  wire analysis 12/27/2014: Conclusions:  Hemodynamically significant distal left main/ostial circumflex stenosis. Borderline significant stenosis extending into the LAD.  Recommendations: Consideration of CABG versus PCI for revascularization. If PCI were chosen, I think the patient would require rotational atherectomy of the left main into the circumflex followed by bifurcation stenting. Would recommend surgical evaluation and if the patient is considered to be a poor candidate for surgery, PCI would be reasonable.  ASSESSMENT AND PLAN: 1.  Coronary artery disease, native vessel, with ischemic chest pain. The patient has it a stress nuclear scan demonstrating multivessel territory ischemia. She has severe distal left main stem stenosis with a complex heavily calcified lesion in the distal left main extending into the ostium of the left circumflex. I have personally reviewed her cardiac catheterization films as well as the results of her pressure wire analysis and nuclear scan. Her antianginal program includes amlodipine and isosorbide. She is now treated appropriately with aspirin and Plavix in anticipation of PCI. She has been evaluated by cardiac surgery and was declined because of her poor functional capacity. I agree that she would have a very difficult recovery from surgery and may never return to her current functional capacity which is already quite limited. I have had a lengthy discussion with the patient and her daughter today about treatment options. This includes palliative medical therapy versus high risk PCI. Considering the functional significance of her distal left main stenosis, PCI is certainly appropriate. I think the best result will be obtained with rotational atherectomy followed by bifurcation stenting. I reviewed our specific plans.  I have reviewed the specific risks, indications, and alternatives to PCI. The patient understands that risks include bleeding, vascular injury,  myocardial infarction, emergency CABG, stroke, acute kidney injury requiring hemodialysis, radiation injury, and death. Despite the higher risk nature of the specific procedure, the overall risk of a successful PCI without major adverse event in this setting remains greater than 90%. The patient understands this and agrees to proceed.  Current medicines are reviewed with the patient today.  The patient does not have concerns regarding medicines.  The following changes have been made:   Continue dual antiplatelet therapy with aspirin and Plavix. PCI as planned above. Patient instructed to take all of her medications prior to arrival at the hospital for her PCI procedure.  Labs/  tests ordered today include: None  No orders of the defined types were placed in this encounter.    Disposition:   Elective PCI Monday, January 18th.  Signed, Sherren Mocha, MD  01/04/2015 2:26 PM    [facility] [place], [place], [place]  [address] Phone: [phone]; Fax: [phone]

## 2015-01-08 ENCOUNTER — Encounter (HOSPITAL_COMMUNITY): Payer: Self-pay | Admitting: Physician Assistant

## 2015-01-08 DIAGNOSIS — I25110 Atherosclerotic heart disease of native coronary artery with unstable angina pectoris: Secondary | ICD-10-CM | POA: Diagnosis not present

## 2015-01-08 DIAGNOSIS — I5032 Chronic diastolic (congestive) heart failure: Secondary | ICD-10-CM | POA: Diagnosis not present

## 2015-01-08 DIAGNOSIS — D649 Anemia, unspecified: Secondary | ICD-10-CM

## 2015-01-08 DIAGNOSIS — K219 Gastro-esophageal reflux disease without esophagitis: Secondary | ICD-10-CM | POA: Diagnosis not present

## 2015-01-08 DIAGNOSIS — E785 Hyperlipidemia, unspecified: Secondary | ICD-10-CM

## 2015-01-08 DIAGNOSIS — Z9861 Coronary angioplasty status: Secondary | ICD-10-CM

## 2015-01-08 DIAGNOSIS — D472 Monoclonal gammopathy: Secondary | ICD-10-CM

## 2015-01-08 DIAGNOSIS — I10 Essential (primary) hypertension: Secondary | ICD-10-CM | POA: Diagnosis not present

## 2015-01-08 DIAGNOSIS — I209 Angina pectoris, unspecified: Secondary | ICD-10-CM

## 2015-01-08 DIAGNOSIS — I2510 Atherosclerotic heart disease of native coronary artery without angina pectoris: Secondary | ICD-10-CM

## 2015-01-08 LAB — BASIC METABOLIC PANEL
ANION GAP: 5 (ref 5–15)
BUN: 12 mg/dL (ref 6–23)
CALCIUM: 8.8 mg/dL (ref 8.4–10.5)
CO2: 31 mmol/L (ref 19–32)
CREATININE: 0.77 mg/dL (ref 0.50–1.10)
Chloride: 103 mEq/L (ref 96–112)
GFR, EST NON AFRICAN AMERICAN: 80 mL/min — AB (ref >90)
Glucose, Bld: 102 mg/dL — ABNORMAL HIGH (ref 70–99)
Potassium: 4.7 mmol/L (ref 3.5–5.1)
Sodium: 139 mmol/L (ref 135–145)

## 2015-01-08 LAB — CBC
HEMATOCRIT: 34.4 % — AB (ref 36.0–46.0)
Hemoglobin: 10.8 g/dL — ABNORMAL LOW (ref 12.0–15.0)
MCH: 27.5 pg (ref 26.0–34.0)
MCHC: 31.4 g/dL (ref 30.0–36.0)
MCV: 87.5 fL (ref 78.0–100.0)
Platelets: 161 10*3/uL (ref 150–400)
RBC: 3.93 MIL/uL (ref 3.87–5.11)
RDW: 18.9 % — ABNORMAL HIGH (ref 11.5–15.5)
WBC: 5.2 10*3/uL (ref 4.0–10.5)

## 2015-01-08 MED ORDER — NEBIVOLOL HCL 2.5 MG PO TABS
2.5000 mg | ORAL_TABLET | Freq: Every day | ORAL | Status: DC
  Administered 2015-01-08: 10:00:00 2.5 mg via ORAL
  Filled 2015-01-08: qty 1

## 2015-01-08 MED ORDER — ROSUVASTATIN CALCIUM 20 MG PO TABS: 10.0000 mg | ORAL_TABLET | ORAL | Status: DC

## 2015-01-08 MED ORDER — NITROGLYCERIN 0.4 MG SL SUBL: 0.4000 mg | SUBLINGUAL_TABLET | SUBLINGUAL | Status: DC | PRN

## 2015-01-08 MED ORDER — CLOPIDOGREL BISULFATE 75 MG PO TABS: 75.0000 mg | ORAL_TABLET | Freq: Every day | ORAL | Status: DC

## 2015-01-08 MED ORDER — NEBIVOLOL HCL 2.5 MG PO TABS: 2.5000 mg | ORAL_TABLET | Freq: Every day | ORAL | Status: DC

## 2015-01-08 MED ORDER — ASPIRIN EC 81 MG PO TBEC: 81.0000 mg | DELAYED_RELEASE_TABLET | Freq: Every day | ORAL | Status: DC

## 2015-01-08 MED ORDER — MECLIZINE HCL 25 MG PO TABS
25.0000 mg | ORAL_TABLET | Freq: Three times a day (TID) | ORAL | Status: DC | PRN
Start: 2015-01-08 — End: 2015-03-27

## 2015-01-08 MED FILL — Sodium Chloride IV Soln 0.9%: INTRAVENOUS | Qty: 50 | Status: AC

## 2015-01-08 NOTE — Discharge Summary (Signed)
Discharge Summary   Patient ID: [name] [name] MRN: [id number], DOB/AGE: 1941/01/26 74 y.o. Admit date: 01/07/2015 D/C date:     01/08/2015  Primary Care Provider: Redge Gainer, MD Primary Cardiologist: Aundra Dubin  Primary Discharge Diagnoses:  1. CAD/unstable angina - turned down for CABG early this month s/p TTVP, rotational atherectomy, PTCA and stenting of the ostial LCx and left main into the LAD (crush technique), and IVUS of the LAD/Left main this admission 2. H/o PAF, noted only post-op 08/2014 spinal fusion, s/p cardioversion, not on anticoag due to anemia, fall risk 3. Poor functional capacity at baseline 4. Spine problems s/p major reconstructive surgery 01/2014, 08/2014 5. MGUS/anemia, followed by heme 6. HTN  7. HLD 8. Paroxysmal atrial tachycardia, started on BB  Comprehensive PMH:  Past Medical History  Diagnosis Date  . Obesity   . Circadian rhythm sleep disorder   . CTS (carpal tunnel syndrome)   . Diarrhea   . IBS (irritable bowel syndrome)   . Diverticulitis of colon   . Gastritis   . Esophageal stricture   . Personal history of colonic polyps 10/25/2011 & 12/02/11    not retrieved Dr Lyla Son & tubular adenomas  . Hiatal hernia   . GERD (gastroesophageal reflux disease)   . Insomnia   . HTN (hypertension)   . Hyperlipidemia   . Depression   . Chronic diastolic CHF (congestive heart failure)   . Memory disorder 12/04/2014  . Pneumonia 03/2014  . Obstructive sleep apnea     "have mask; don't wear it" (01/07/2015)  . Anemia   . History of blood transfusion     "most of them related to OR's"   . MGUS (monoclonal gammopathy of unknown significance) dx'd 11/2014    a. Neg BMB 11/2014.  . Stroke early 2000's    "small"; denies residual on 01/07/2015)  . Arthritis     "knees, back, fingers, toes; joints" (01/07/2015)  . Chronic back pain greater than 3 months duration     a. spinal stenosis. Spinal fusion with rods in 2/15 at [facility] spinal fusion  9/15.  Marland Kitchen Anxiety   . CAD (coronary artery disease)     a. Abnl nuc 11/2014. Cath 12/2014 - turned down for CABG. Ultimately s/p TTVP, rotational atherectomy, PTCA and stenting of the ostial LCx and left main into the LAD (crush technique), and IVUS of the LAD/Left main.  Marland Kitchen PAF (paroxysmal atrial fibrillation)     a.  Paroxysmal. Has only been noted post-op 9/15 spinal fusion. She had cardioversion, not on anticoagulation. Fall risk, unsteady.  . Sinus bradycardia     a. Baseline HR 50s-60s.  Marland Kitchen PAT (paroxysmal atrial tachycardia)   . Multiple falls   . Orthostasis    Hospital Course: [name] [name] is a 74 y/o F with history of HTN, hyperlipidemia, spinal stenosis (s/p surgery 01/2014, 08/2014 - PAF following latter surgery), MGUS/anemia who presented to [facility] for planned PCI of CAD.   Prior to a back surgery, she had Lexiscan Cardiolite in 11/14 with no ischemia or infarction. After her first operation 01/2014, she remained in considerable back pain.She had repeat operation in 9/15 (spinal fusion T3-T12).Post-op, she had atrial fibrillation. She had DCCV to NSR but was not sent home on anticoagulation. She remains in NSR. Her post-operative course was difficult.She was orthostatic with multiple falls as well as anemia and confusion.She was kept off anticoagulation because of this.Confusion has improved.She is still mildly lightheaded with standing but no falls  over the last few weeks.In 12/15, she was seen by Truitt Merle with chest pain and dyspnea.Lexiscan Cardiolite was done in 12/15 showing normal EF but areas of ischemia concerning for multivessel disease. She underwent LHC 12/27/2014 with multivessel disease. She was seen by CVTS and turned down for CABG. Ultimately the decision was made to proceed with palliative PCI. She was brought in yesterday for this procedure and underwent temporary transvenous pacemaker placement, rotational atherectomy, PTCA and stenting of the ostial LCx  and left main into the LAD (crush technique), guided by IVUS. DAPT for at least 12 months was recommended, favor long-term as tolerated. Overnight she was noted to have 2 brief episodes of PAT on telemetry. Low dose BB was added - bisoprolol 2.5mg  daily. Baseline HR 50s-60s. Post-procedurally she did note some chronic abdominal discomfort but states this is longstanding - no abdominal or flank/bank ecchymosis, good pulses, no femoral bruits. Hgb slightly decreased from prior (baseline 11 range) but likely procedurally/blood draw related. She had no evidence of bleeding. Dr. Ellyn Hack has seen and examined the patient today and feels he is stable for discharge.  I have sent a message to our [facility] office's scheduler requesting a follow-up appointment, and our office will call the patient with this information.  Discharge Vitals: Blood pressure 138/61, pulse 73, temperature 98.8 F (37.1 C), temperature source Oral, resp. rate 18, height 5\' 7"  (1.702 m), weight 185 lb (83.915 kg), SpO2 93 %.  Labs: Lab Results  Component Value Date   WBC 5.2 01/08/2015   HGB 10.8* 01/08/2015   HCT 34.4* 01/08/2015   MCV 87.5 01/08/2015   PLT 161 01/08/2015    Recent Labs Lab 01/03/15 1310 01/08/15 0337  NA 139 139  K 4.5 4.7  CL 97 103  CO2 29 31  BUN 14 12  CREATININE 0.75 0.77  CALCIUM 9.3 8.8  PROT 6.6  --   BILITOT 0.4  --   ALKPHOS 110  --   ALT 15  --   AST 16  --   GLUCOSE 94 102*    Lab Results  Component Value Date   CHOL 138 08/21/2014   HDL 61 01/03/2015   LDLCALC 63 01/03/2015   TRIG 171* 01/03/2015   Lab Results  Component Value Date   DDIMER 1.12* 11/28/2014    Diagnostic Studies/Procedures  Cardiac catheterization this admission, please see full report and above for summary.  Discharge Medications   Current Discharge Medication List    START taking these medications   Details  nebivolol (BYSTOLIC) 2.5 MG tablet Take 1 tablet (2.5 mg total) by mouth daily. Qty: 30  tablet, Refills: 6    nitroGLYCERIN (NITROSTAT) 0.4 MG SL tablet Place 1 tablet (0.4 mg total) under the tongue every 5 (five) minutes as needed for chest pain (up to 3 doses). Qty: 25 tablet, Refills: 3      CONTINUE these medications which have CHANGED   Details  aspirin EC 81 MG tablet Take 1 tablet (81 mg total) by mouth daily.    clopidogrel (PLAVIX) 75 MG tablet Take 1 tablet (75 mg total) by mouth daily. Qty: 30 tablet, Refills: 11    meclizine (ANTIVERT) 25 MG tablet Take 1 tablet (25 mg total) by mouth 3 (three) times daily as needed for dizziness.Patient taking this way prior to admission    rosuvastatin (CRESTOR) 20 MG tablet Take 0.5-1 tablets (10-20 mg total) by mouth every other day. 20 mg one day alternate 10 mg the next day  and repeats Patient taking this way prior to admission      CONTINUE these medications which have NOT CHANGED   Details  amLODipine (NORVASC) 2.5 MG tablet Take 1 tablet by mouth  daily. For HTN     amoxicillin (AMOXIL) 500 MG capsule Take 1 capsule (500 mg total) by mouth 3 (three) times daily.     Calcium Carbonate-Vit D-Min (CALCIUM 1200) 1200-1000 MG-UNIT CHEW Chew 1 each by mouth daily.    colesevelam (WELCHOL) 625 MG tablet Take 625 mg by mouth daily with breakfast.    diphenoxylate-atropine (LOMOTIL) 2.5-0.025 MG per tablet Take 1 tablet by mouth 4 (four) times daily as needed for diarrhea or loose stools.     Eszopiclone 3 MG TABS Take 1 tablet (3 mg total) by mouth at bedtime. For insomnia,take immediately before bedtime.     fentaNYL (DURAGESIC - DOSED MCG/HR) 50 MCG/HR Place 1 patch (50 mcg total) onto the skin every 3 (three) days.     FLUoxetine (PROZAC) 40 MG capsule Take 1 capsule (40 mg total) by mouth daily. For depression     furosemide (LASIX) 20 MG tablet Take 1 tablet (20 mg total) by mouth daily.     HYDROcodone-acetaminophen (NORCO) 10-325 MG per tablet Take 1 tablet by mouth every 4 (four) hours as needed for  moderate pain.    Associated Diagnoses: Anemia, unspecified anemia type    iron polysaccharides (NIFEREX) 150 MG capsule Take 1 capsule (150 mg total) by mouth daily.     isosorbide mononitrate (IMDUR) 30 MG 24 hr tablet Take 30 mg by mouth daily.     LORazepam (ATIVAN) 0.5 MG tablet Take 1 tablet (0.5 mg total) by mouth 2 (two) times daily as needed for anxiety.     Multiple Vitamin (MULTIVITAMIN) tablet Take 1 tablet by mouth daily. For supplement    pantoprazole (PROTONIX) 40 MG tablet Take 1 tablet (40 mg total) by mouth daily.     methocarbamol (ROBAXIN) 500 MG tablet Take 500 mg by mouth every 6 (six) hours as needed for muscle spasms.         Disposition   The patient will be discharged in stable condition to home. Discharge Instructions    Diet - low sodium heart healthy    Complete by:  As directed      Increase activity slowly    Complete by:  As directed   No driving for 2 days (or as otherwise instructed by your doctor). No lifting over 5 lbs for 1 week. No sexual activity for 1 week. Keep procedure site clean & dry. If you notice increased pain, swelling, bleeding or pus, call/return!  You may shower, but no soaking baths/hot tubs/pools for 1 week.          Follow-up Information    Follow up with Loralie Champagne, MD.   Specialty:  Cardiology   Why:  Office will call you for your followup appointment. Call office if you have not heard back in 3 days.   Contact information:   [address] [place] [address] [phone]         Duration of Discharge Encounter: Greater than 30 minutes including physician and PA time.  Signed, Dayna Dunn PA-C 01/08/2015, 8:03 AM   I saw & examined the patient this AM prior to d/c.  Agree with D/C summary - stable s/p LM-Cx-LAD bifurcation PCI. OK for d/c - See final PN for details.  Leonie Man, MD

## 2015-01-08 NOTE — Progress Notes (Signed)
CARDIAC REHAB PHASE I   PRE:  Rate/Rhythm: 73 SR  BP:  Supine: 117/53  Sitting: 136/54  Standing:    SaO2: 95 RA  MODE:  Ambulation: 380 ft   POST:  Rate/Rhythm: 82 SR  BP:  Supine:   Sitting: 157/60  Standing:    SaO2: 96 RA 2992-4268 Pt tolerated ambulation with assist X 1 and using walker. Gait steady with walker. Pt able to walk 380 feet, without c/o of cp. She c/o of SOB during walk, but room air sat after walk 96%. Pt to recliner after walk with call light in reach. Completed discharge education with pt. She lives alone,but her daughter and son lives close by. Pt takes her medications,but her daughter fills her medication box for her and call her to remind her. She does not drive but her daughter takes her to appointments. Pt voices understanding. She need some reminder with teachback with the sl NTG. She is not interested in attending Outpt. CRP, Pt wants to do more Physical Therapy due to her back problems. I encouraged her to walk as she could tolerate at home.  Rodney Langton RN 01/08/2015 9:28 AM

## 2015-01-08 NOTE — Progress Notes (Signed)
Patient: [name] [name] / Admit Date: 01/07/2015 / Date of Encounter: 01/08/2015, 7:02 AM   Subjective: C/o chronic abdominal pain (not worse recently). No chest pain or dyspnea.    Objective: Telemetry: NSR, 2 brief runs of PAT Physical Exam: Blood pressure 122/67, pulse 58, temperature 98.8 F (37.1 C), temperature source Oral, resp. rate 18, height 5\' 7"  (1.702 m), weight 185 lb (83.915 kg), SpO2 94 %. General: Well developed, well nourished WF, in no acute distress. Lying flat in bed. Head: Normocephalic, atraumatic, sclera non-icteric, no xanthomas, nares are without discharge. Neck: JVP not elevated. Lungs: Clear bilaterally to auscultation without wheezes, rales, or rhonchi. Breathing is unlabored. Heart: RRR S1 S2 without murmurs, rubs, or gallops.  Abdomen: Soft, non-tender, non-distended with normoactive bowel sounds. No rebound/guarding. Extremities: No clubbing or cyanosis. No edema. Distal pedal pulses are 2+ and equal bilaterally. R femoral site without ecchymosis, hematoma or bruit. Neuro: Alert and oriented X 3. Moves all extremities spontaneously. Psych:  Responds to questions appropriately with a normal affect.   Intake/Output Summary (Last 24 hours) at 01/08/15 4097 Last data filed at 01/07/15 2203  Gross per 24 hour  Intake  659.5 ml  Output    300 ml  Net  359.5 ml    Inpatient Medications:  . amLODipine  2.5 mg Oral Daily  . amoxicillin  500 mg Oral TID  . aspirin EC  81 mg Oral Daily  . clopidogrel  75 mg Oral Daily  . feeding supplement (ENSURE COMPLETE)  237 mL Oral BID BM  . [START ON 01/10/2015] fentaNYL  50 mcg Transdermal Q72H  . FLUoxetine  40 mg Oral Daily  . furosemide  20 mg Oral Daily  . iron polysaccharides  150 mg Oral Daily  . isosorbide mononitrate  30 mg Oral Daily  . multivitamin with minerals  1 tablet Oral Daily  . pantoprazole  40 mg Oral Daily  . rosuvastatin  20 mg Oral Daily  . sodium chloride  3 mL Intravenous Q12H    Infusions:    Labs:  Recent Labs  01/08/15 0337  NA 139  K 4.7  CL 103  CO2 31  GLUCOSE 102*  BUN 12  CREATININE 0.77  CALCIUM 8.8   No results for input(s): AST, ALT, ALKPHOS, BILITOT, PROT, ALBUMIN in the last 72 hours.  Recent Labs  01/08/15 0337  WBC 5.2  HGB 10.8*  HCT 34.4*  MCV 87.5  PLT 161   No results for input(s): CKTOTAL, CKMB, TROPONINI in the last 72 hours. Invalid input(s): POCBNP No results for input(s): HGBA1C in the last 72 hours.   Radiology/Studies:  See cath report   Assessment and Plan  1. CAD/unstable angina - turned down for CABG early this month. , s/p TTVP, rotational atherectomy, PTCA and stenting of the ostial LCx and left main into the LAD (crush technique), and IVUS of the LAD/Left main this admission. Recommendation for DAPT for at least 12 months, long term as tolerated. Note normal EF by nuc 11/2014. 2. H/o PAF, noted only post-op 08/2014 spinal fusion, s/p cardioversion, not on anticoag due to anemia, fall risk 3. Poor functional capacity at baseline 4. Spine problems s/p major reconstructive surgery 01/2014, 08/2014 5. MGUS/anemia - followed by heme. Hgb slightly down from prior, maybe procedurally related. 6. HTN - controlled.  7. HLD - LDL 63. Continue Crestor. 8. Paroxysmal atrial tachycardia - will start low dose BB - Bystolic 2.5mg  per d/w MD.  Signed, Melina Copa PA-C  I have seen, examined and evaluated the patient this AM along with Melina Copa, PA-C.  After reviewing all the available data and chart,  I agree with her findings, examination as well as impression recommendations.  Stable s/p Bifurcation PCI of dLM-Ostial Cx-LAD.  Has yet to ambulate.  Groin stable. Short run of PAT on tele (& reported h/o PAF) - agree with trial of low dose BB (Bystolic would be best option given relatively low resting HR).  On DAPT, + Statin.  Needs to ambulate with CRH - if stable, OK to d/c.   Leonie Man, M.D.,  M.S. Interventional Cardiologist   Pager # [phone]

## 2015-01-11 ENCOUNTER — Encounter (HOSPITAL_COMMUNITY): Payer: Self-pay

## 2015-01-14 ENCOUNTER — Encounter: Payer: Self-pay | Admitting: Cardiology

## 2015-01-14 ENCOUNTER — Ambulatory Visit (INDEPENDENT_AMBULATORY_CARE_PROVIDER_SITE_OTHER): Payer: Medicare Other | Admitting: Cardiology

## 2015-01-14 VITALS — BP 124/56 | HR 57 | Ht 67.0 in | Wt 185.0 lb

## 2015-01-14 DIAGNOSIS — I480 Paroxysmal atrial fibrillation: Secondary | ICD-10-CM

## 2015-01-14 DIAGNOSIS — I2510 Atherosclerotic heart disease of native coronary artery without angina pectoris: Secondary | ICD-10-CM

## 2015-01-14 NOTE — Progress Notes (Signed)
Patient ID: [name] [name], female   DOB: 1941/04/19, 74 y.o.   MRN: [id number] PCP: Dr. Laurance Flatten  74 yo with history of HTN, hyperlipidemia, and nonobstructive CAD presents for cardiology followup.  Prior to a back surgery, she had Lexiscan Cardiolite in 11/14 with no ischemia or infarction.  After her first operation, she remained in considerable pain.  She had repeat operation in 9/15 (spinal fusion T3-T12).  Post-op, she had atrial fibrillation.  She had DCCV to NSR but was not sent home on anticoagulation.  She remains in NSR. Her post-operative course was difficult.  She was orthostatic with multiple falls as well as anemia and confusion.  She was kept off anticoagulation because of this.  Confusion has improved.  She is still mildly lightheaded with standing but no falls over the last few weeks.    In 12/15, she was seen by Truitt Merle with chest pain and dyspnea.  Lexiscan Cardiolite was done in 12/15 showing normal EF but areas of ischemia concerning for multivessel disease. She was having episodes of chest soreness that occurred daily.  Not related to exertion.  She also had pain in her back related to recent surgery.     I took her for left heart cath in 1/16.  This showed severe distal left main/ostial LCx disease.  She was deemed a poor candidate for CABG due to the difficulty she would have with rehabilitation after the procedure.  She therefore went for bifurcation stenting of the distal LM/ostial LCx with rotational atherectomy.  She says that her chest soreness is now better.  She has some residual chest pain that seems to radiate from her back and may be musculoskeletal.  She is short of breath walking around her house still but this is also improved.   Labs (9/14) K 4.4, creatinine 0.81, LDL particle number 1279, LDL 69 Labs (5/15): K 4.5, creatinine 0.8, LDL-P 745, LDL 38 Labs (12/15): K 4, creatinine 0.63 => 0.77, HCT 37.2, BNP 366, LDL 63, HDL 62  PMH: 1. Chronic low back pain:  spinal stenosis.  Spinal fusion with rods in 2/15 at [facility].  Repeat spinal fusion 9/15.  2. Obesity 3. H/o diverticulitis 4. IBS 5. H/o colon polyps 6. GERD: h/o hiatal hernia 7. OSA 8. HTN 9. Hyperlipidemia 10. Depression 11. Cholecystectomy 12. H/o bilateral TKR 13. CAD: LHC (5/09) with 50% ostial LCx, 30-40% mRCA, 30% mLAD.  Lexiscan Cardiolite in 11/14 with EF 68%, no ischemia or infarction. Lexiscan Cardiolite (12/15) with EF 57%, moderate anteroapical and anteroseptal ischemia, inferolateral infarct with peri-infarct ischemia => suggestive of multivessel disease. LHC (1/16) with severe distal left main and ostial LCx stenosis confirmed by FFR.  Patient had bifurcation stenting of the distal LM/ostial LCx with rotational atherectomy.  14. PNA in 3/15 15. Atrial fibrillation: Paroxysmal.  Has only been noted post-op 9/15 spinal fusion.  She had cardioversion, not on anticoagulation.  16. MGUS: Negative bone marrow biopsy 12/15.  17. Anemia  SH: Widow, 2 children, retired Licensed conveyancer, nonsmoker.    FH: CAD in father, mother, brother.    ROS: All systems reviewed and negative except as per HPI.   Current Outpatient Prescriptions  Medication Sig Dispense Refill  . amLODipine (NORVASC) 2.5 MG tablet Take 1 tablet by mouth  daily. For HTN (Patient taking differently: Take 2.5 mg by mouth daily. ) 90 tablet 3  . amoxicillin (AMOXIL) 500 MG capsule Take 1 capsule (500 mg total) by mouth 3 (three) times daily. 21 capsule 0  .  aspirin EC 81 MG tablet Take 1 tablet (81 mg total) by mouth daily.    . Calcium Carbonate-Vit D-Min (CALCIUM 1200) 1200-1000 MG-UNIT CHEW Chew 1 each by mouth daily.    . clopidogrel (PLAVIX) 75 MG tablet Take 1 tablet (75 mg total) by mouth daily. 30 tablet 11  . colesevelam (WELCHOL) 625 MG tablet Take 625 mg by mouth daily with breakfast.    . diphenoxylate-atropine (LOMOTIL) 2.5-0.025 MG per tablet Take 1 tablet by mouth 4 (four) times daily as needed for  diarrhea or loose stools.     . Eszopiclone 3 MG TABS Take 1 tablet (3 mg total) by mouth at bedtime. For insomnia,take immediately before bedtime. 90 tablet 1  . fentaNYL (DURAGESIC - DOSED MCG/HR) 50 MCG/HR Place 1 patch (50 mcg total) onto the skin every 3 (three) days. 10 patch 0  . FLUoxetine (PROZAC) 40 MG capsule Take 1 capsule (40 mg total) by mouth daily. For depression 90 capsule 3  . furosemide (LASIX) 20 MG tablet Take 1 tablet (20 mg total) by mouth daily. 90 tablet 3  . HYDROcodone-acetaminophen (NORCO) 10-325 MG per tablet Take 1 tablet by mouth every 4 (four) hours as needed for moderate pain.     . iron polysaccharides (NIFEREX) 150 MG capsule Take 1 capsule (150 mg total) by mouth daily. 90 capsule 3  . isosorbide mononitrate (IMDUR) 30 MG 24 hr tablet Take 30 mg by mouth daily.     Marland Kitchen LORazepam (ATIVAN) 0.5 MG tablet Take 1 tablet (0.5 mg total) by mouth 2 (two) times daily as needed for anxiety. 60 tablet 1  . meclizine (ANTIVERT) 25 MG tablet Take 1 tablet (25 mg total) by mouth 3 (three) times daily as needed for dizziness.    . Multiple Vitamin (MULTIVITAMIN) tablet Take 1 tablet by mouth daily. For supplement    . nebivolol (BYSTOLIC) 2.5 MG tablet Take 1 tablet (2.5 mg total) by mouth daily. 30 tablet 6  . nitroGLYCERIN (NITROSTAT) 0.4 MG SL tablet Place 1 tablet (0.4 mg total) under the tongue every 5 (five) minutes as needed for chest pain (up to 3 doses). 25 tablet 3  . pantoprazole (PROTONIX) 40 MG tablet Take 1 tablet (40 mg total) by mouth daily. 30 tablet 3  . rosuvastatin (CRESTOR) 20 MG tablet Take 0.5-1 tablets (10-20 mg total) by mouth every other day. 20 mg one day alternate 10 mg the next day and repeats     No current facility-administered medications for this visit.   BP 124/56 mmHg  Pulse 57  Ht _0  (1.702 m)  Wt 185 lb (83.915 kg)  BMI 28.97 kg/m2  SpO2 96% General: NAD Neck: No JVD, no thyromegaly or thyroid nodule.  Lungs: Clear to auscultation  bilaterally with normal respiratory effort. CV: Nondisplaced PMI.  Heart regular S1/S2, no S3/S4, no murmur.  No peripheral edema.  No carotid bruit.  Normal pedal pulses.  Abdomen: Soft, nontender, no hepatosplenomegaly, no distention.  Skin: Intact without lesions or rashes.  Neurologic: Alert and oriented x 3.  Psych: Normal affect.  Assessment/Plan: 1. CAD: Atypical chest pain but recent abnormal Cardiolite concerning for multivessel disease. She is now s/p bifurcation stenting of the distal LM/ostial LCx with rotational atherectomy as she was deemed a poor candidate for CABG.  She does seem to be having less chest pain and dyspnea now.   - Continue ASA 81, Plavix, and statin.  - I will have her continue Imdur for now.  2. Hyperlipidemia:  Good lipids on Crestor.   3. HTN: BP controlled on current medications.    4. Atrial fibrillation: Paroxysmal.  Has only occurred post-op spinal fusion.  She was not anticoagulated given frequent falls.  I will leave her off for now but if she has recurrent atrial fibrillation, would start anticoagulation as she is now less orthostatic.   Followup in 2 months.    Loralie Champagne 01/14/2015

## 2015-01-14 NOTE — Patient Instructions (Signed)
Your physician recommends that you schedule a follow-up appointment in: 2 months with Dr McLean.   

## 2015-01-21 HISTORY — PX: POSTERIOR LUMBAR FUSION: SHX6036

## 2015-01-28 ENCOUNTER — Ambulatory Visit: Payer: Medicare Other | Attending: Neurological Surgery | Admitting: Physical Therapy

## 2015-01-28 DIAGNOSIS — Z9181 History of falling: Secondary | ICD-10-CM | POA: Diagnosis not present

## 2015-01-28 DIAGNOSIS — I10 Essential (primary) hypertension: Secondary | ICD-10-CM | POA: Insufficient documentation

## 2015-01-28 DIAGNOSIS — Z4789 Encounter for other orthopedic aftercare: Secondary | ICD-10-CM | POA: Insufficient documentation

## 2015-01-28 DIAGNOSIS — R42 Dizziness and giddiness: Secondary | ICD-10-CM | POA: Diagnosis not present

## 2015-01-28 DIAGNOSIS — Z96659 Presence of unspecified artificial knee joint: Secondary | ICD-10-CM | POA: Insufficient documentation

## 2015-01-31 ENCOUNTER — Ambulatory Visit: Payer: Medicare Other | Admitting: *Deleted

## 2015-01-31 DIAGNOSIS — Z4789 Encounter for other orthopedic aftercare: Secondary | ICD-10-CM | POA: Diagnosis not present

## 2015-02-01 ENCOUNTER — Telehealth: Payer: Self-pay | Admitting: Cardiology

## 2015-02-01 NOTE — Telephone Encounter (Signed)
Called patient back to inform them of Dr. Claris Gladden input regarding the nebivolol. Patient's daughter will call us back next week to let MD know how patient is doing off the nebivolol.

## 2015-02-01 NOTE — Telephone Encounter (Signed)
New message      Pt is dizzy.  Family is concerned that it may be coming from her medications and they do not want her to fall. She has not had her bp checked recently. Please advise.

## 2015-02-01 NOTE — Telephone Encounter (Signed)
Patient c/o constant dizziness for the last week.  She blames Bystolic for her dizziness. She takes her antivert as directed and she st it helps a little. Instructed patient's daughter to take HR, and it was 39. Patient is otherwise asymptomatic.  Per Melina Copa, patient to STOP Bystolic for the weekend and call back Monday for updates as Dr. Aundra Dubin is out of the office.  Instructed patient to go to the ED over the weekend if any more symptoms develop.  Patient and her daughter agree with treatment plan.

## 2015-02-01 NOTE — Telephone Encounter (Signed)
She can stop the nebivolol.

## 2015-02-05 ENCOUNTER — Encounter: Payer: Medicare Other | Admitting: *Deleted

## 2015-02-07 ENCOUNTER — Ambulatory Visit: Payer: Medicare Other | Admitting: Hematology

## 2015-03-13 IMAGING — CT CT ANGIO CHEST
2 of 9 series · 19 of 46 positions shown · IV contrast (OMNI)
Comparison: 03/10/2010

CLINICAL DATA: Elevated D-dimer, chest soreness, pain with
inspiration, shortness of breath, leg edema LEFT greater than RIGHT

EXAM:
CT ANGIOGRAPHY CHEST WITH CONTRAST
TECHNIQUE: Multidetector CT imaging of the chest was performed using the
standard protocol during bolus administration of intravenous
contrast. Multiplanar CT image reconstructions and MIPs were
obtained to evaluate the vascular anatomy.
CONTRAST:  100 cc Omnipaque 350 IV

[Series 5: thins · axial · 0.58mm/px · z∈[-274,-0]mm · 16 of 310 slices shown]
[im 18/310  lung]
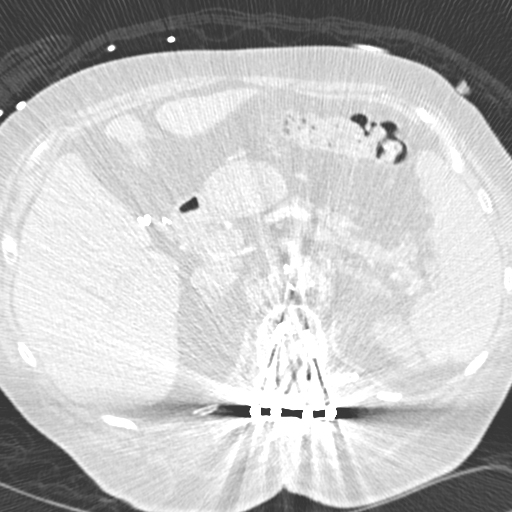
[im 35/310  soft-tissue]
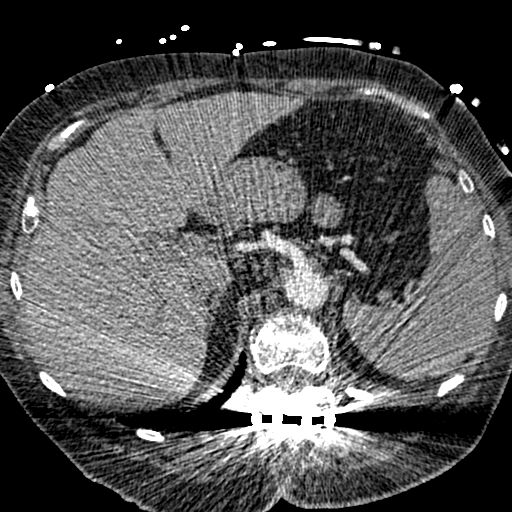
[im 52/310  lung]
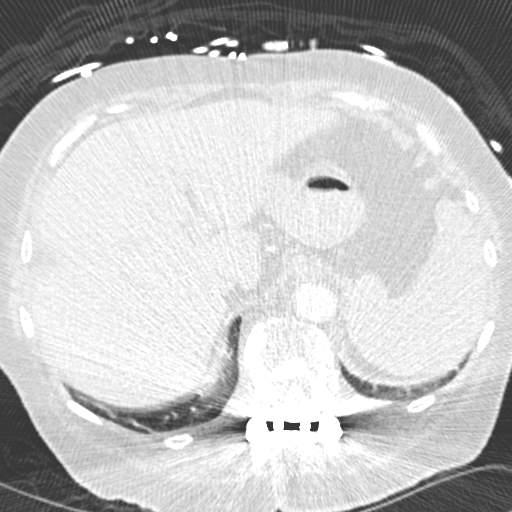
[im 69/310  soft-tissue]
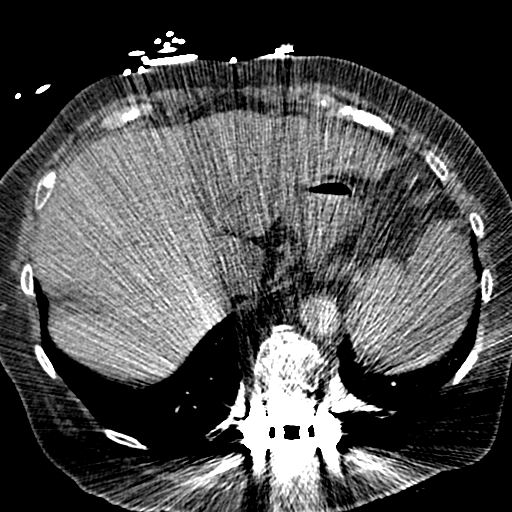
[im 86/310  lung]
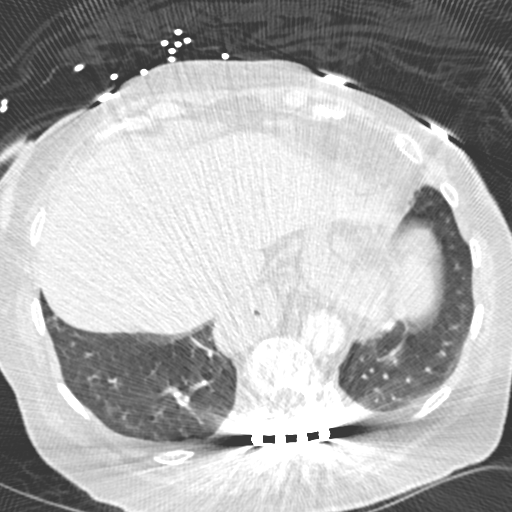
[im 104/310  soft-tissue]
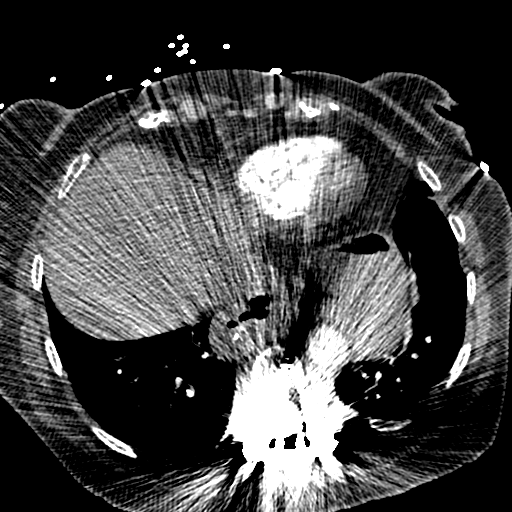
[im 121/310  lung]
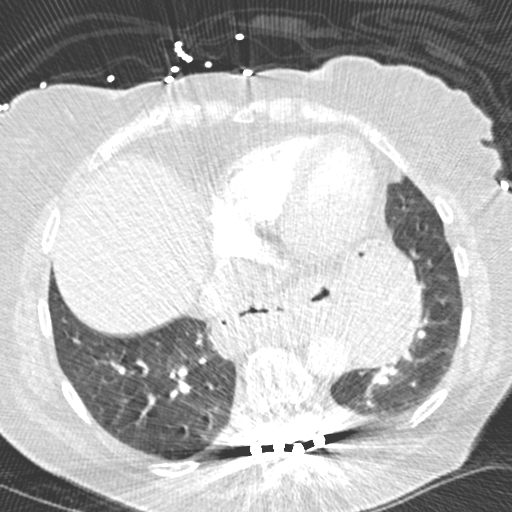
[im 138/310  soft-tissue]
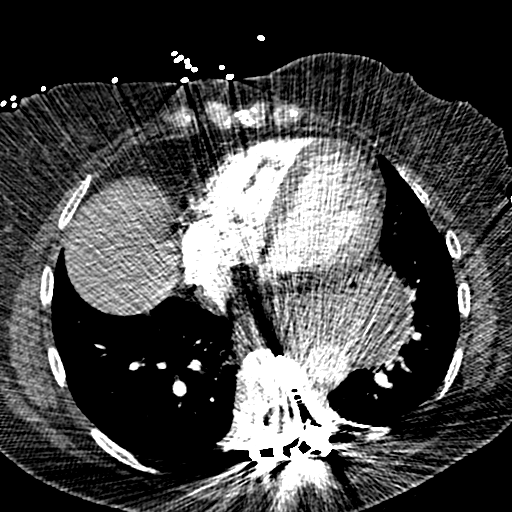
[im 172/310  lung]
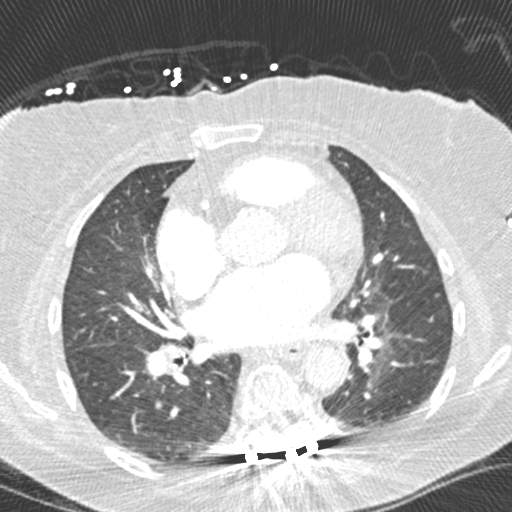
[im 189/310  soft-tissue]
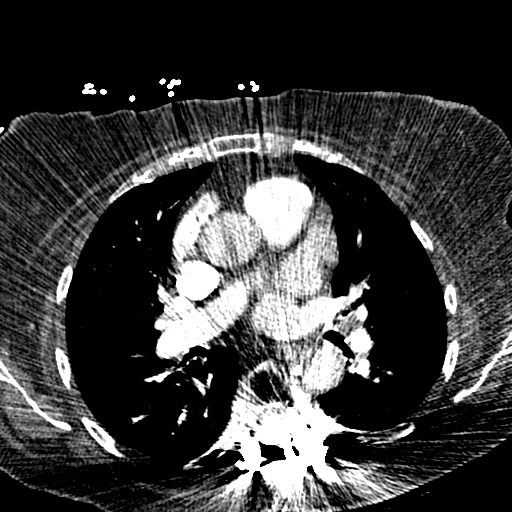
[im 207/310  lung]
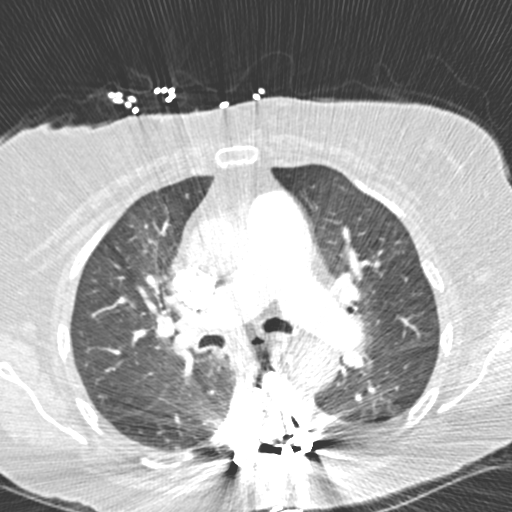
[im 224/310  soft-tissue]
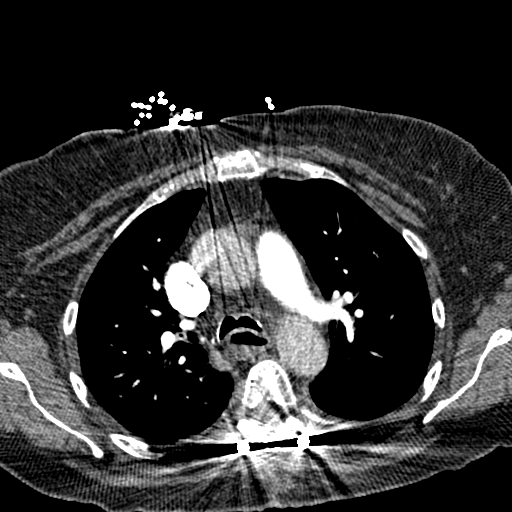
[im 241/310  lung]
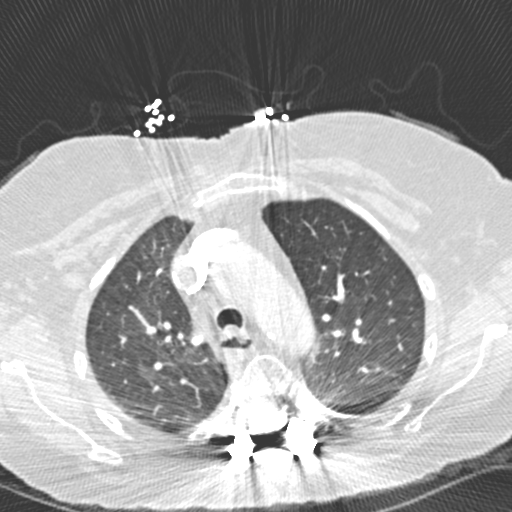
[im 258/310  soft-tissue]
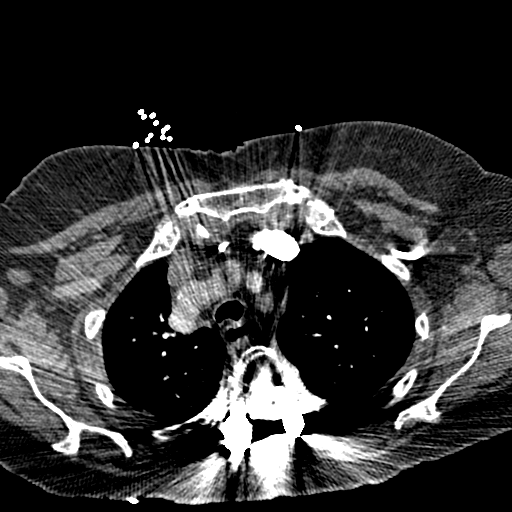
[im 275/310  lung]
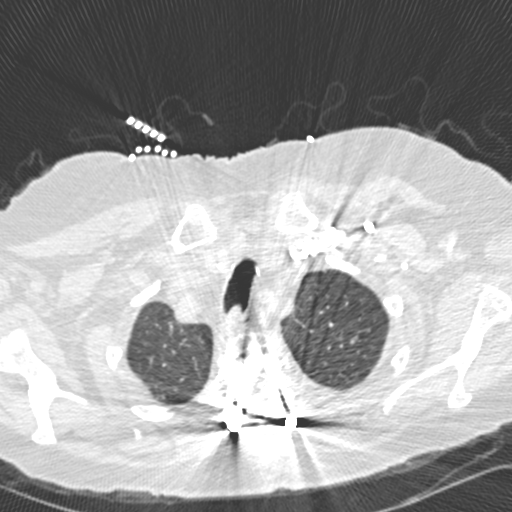
[im 292/310  soft-tissue]
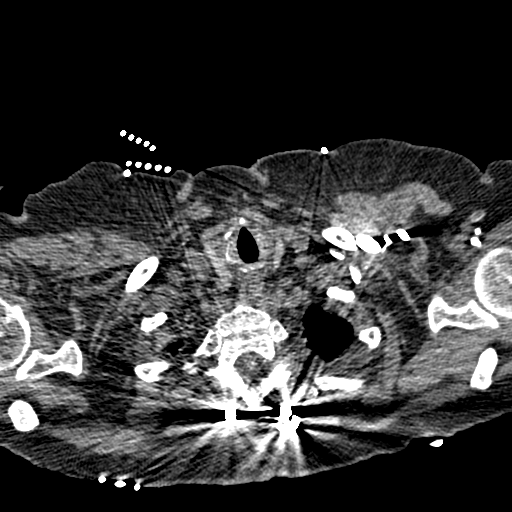

[Series 7: coronal mpr · coronal · 0.61mm/px · 3 of 117 slices shown]
[im 30/117  soft-tissue]
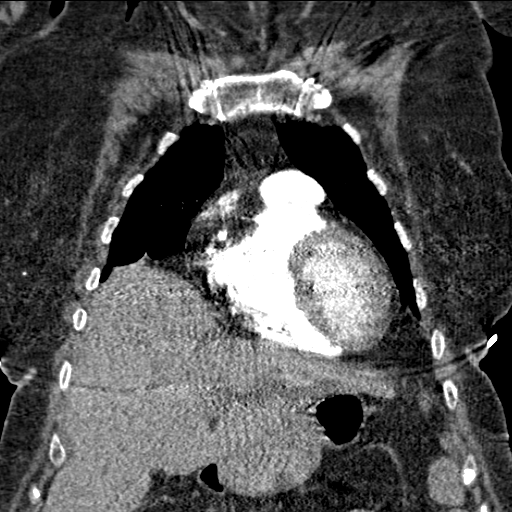
[im 59/117  soft-tissue]
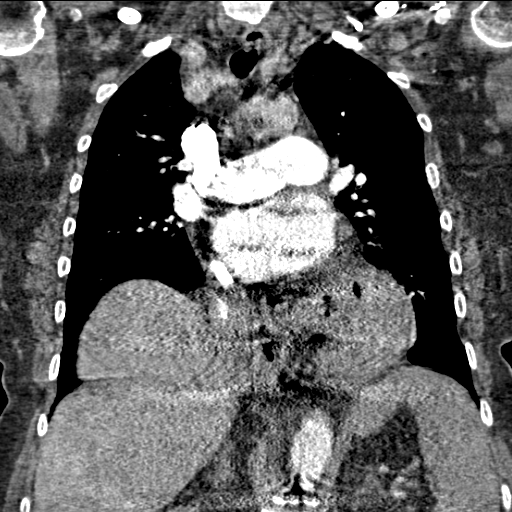
[im 88/117  soft-tissue]
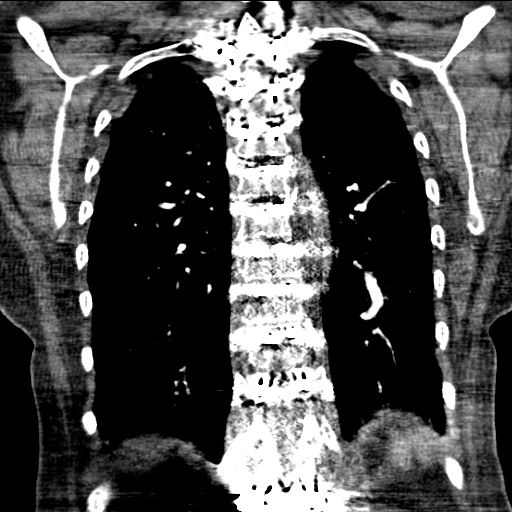

[19 of 46 positions shown; findings below may reference images not displayed]

FINDINGS: Aorta normal caliber were without aneurysm or dissection.

Scattered atherosclerotic calcifications aorta and coronary
arteries.

Extensive beam hardening artifacts from orthopedic prior hardware in
the spine.

Pulmonary arteries patent.

No evidence of pulmonary embolism.

Large hiatal hernia.

No thoracic adenopathy.

Visualized portion of upper abdomen significant only for prior
cholecystectomy.

Within limitations of beam hardening artifacts, no gross pulmonary
infiltrate, pleural effusion or pneumothorax identified.

No acute osseous findings.

Review of the MIP images confirms the above findings.
IMPRESSION: No evidence of pulmonary embolism.

Large hiatal hernia.

## 2015-03-22 ENCOUNTER — Encounter: Payer: Self-pay | Admitting: Cardiology

## 2015-03-22 ENCOUNTER — Ambulatory Visit (INDEPENDENT_AMBULATORY_CARE_PROVIDER_SITE_OTHER): Payer: Medicare Other | Admitting: Cardiology

## 2015-03-22 VITALS — BP 136/82 | HR 65 | Ht 67.0 in | Wt 189.0 lb

## 2015-03-22 DIAGNOSIS — K449 Diaphragmatic hernia without obstruction or gangrene: Secondary | ICD-10-CM | POA: Insufficient documentation

## 2015-03-22 DIAGNOSIS — H9190 Unspecified hearing loss, unspecified ear: Secondary | ICD-10-CM | POA: Insufficient documentation

## 2015-03-22 DIAGNOSIS — Z8781 Personal history of (healed) traumatic fracture: Secondary | ICD-10-CM | POA: Insufficient documentation

## 2015-03-22 DIAGNOSIS — I480 Paroxysmal atrial fibrillation: Secondary | ICD-10-CM

## 2015-03-22 DIAGNOSIS — M5136 Other intervertebral disc degeneration, lumbar region: Secondary | ICD-10-CM | POA: Insufficient documentation

## 2015-03-22 DIAGNOSIS — W19XXXA Unspecified fall, initial encounter: Secondary | ICD-10-CM | POA: Insufficient documentation

## 2015-03-22 DIAGNOSIS — I2510 Atherosclerotic heart disease of native coronary artery without angina pectoris: Secondary | ICD-10-CM | POA: Diagnosis not present

## 2015-03-22 DIAGNOSIS — M419 Scoliosis, unspecified: Secondary | ICD-10-CM | POA: Insufficient documentation

## 2015-03-22 DIAGNOSIS — Z9189 Other specified personal risk factors, not elsewhere classified: Secondary | ICD-10-CM | POA: Insufficient documentation

## 2015-03-22 DIAGNOSIS — K589 Irritable bowel syndrome without diarrhea: Secondary | ICD-10-CM | POA: Insufficient documentation

## 2015-03-22 DIAGNOSIS — L280 Lichen simplex chronicus: Secondary | ICD-10-CM | POA: Insufficient documentation

## 2015-03-22 DIAGNOSIS — B029 Zoster without complications: Secondary | ICD-10-CM | POA: Insufficient documentation

## 2015-03-22 DIAGNOSIS — S329XXA Fracture of unspecified parts of lumbosacral spine and pelvis, initial encounter for closed fracture: Secondary | ICD-10-CM

## 2015-03-22 HISTORY — DX: Diaphragmatic hernia without obstruction or gangrene: K44.9

## 2015-03-22 MED ORDER — ISOSORBIDE MONONITRATE ER 30 MG PO TB24: 30.0000 mg | ORAL_TABLET | Freq: Every day | ORAL | Status: DC

## 2015-03-22 MED ORDER — CLOPIDOGREL BISULFATE 75 MG PO TABS: 75.0000 mg | ORAL_TABLET | Freq: Every day | ORAL | Status: DC

## 2015-03-22 MED ORDER — PANTOPRAZOLE SODIUM 40 MG PO TBEC: 40.0000 mg | DELAYED_RELEASE_TABLET | Freq: Every day | ORAL | Status: DC

## 2015-03-22 NOTE — Patient Instructions (Addendum)
Your physician wants you to follow-up in: 3 months with Dr Aundra Dubin. (April 2016). You will receive a reminder letter in the mail two months in advance. If you don't receive a letter, please call our office to schedule the follow-up appointment.   You have been referred to Cardiac Rehab at [facility].

## 2015-03-24 NOTE — Progress Notes (Signed)
Patient ID: [name] [name], female   DOB: [birth year], 74 y.o.   MRN: [id number] PCP: Dr. Laurance Flatten  74 yo with history of HTN, hyperlipidemia, and nonobstructive CAD presents for cardiology followup.  Prior to a back surgery, she had Lexiscan Cardiolite in 11/14 with no ischemia or infarction.  After her first operation, she remained in considerable pain.  She had repeat operation in 9/15 (spinal fusion T3-T12).  Post-op, she had atrial fibrillation.  She had DCCV to NSR but was not sent home on anticoagulation.  She remains in NSR. Her post-operative course was difficult.  She was orthostatic with multiple falls as well as anemia and confusion.  She was kept off anticoagulation because of this.  Confusion has improved.  She is still mildly lightheaded with standing but no falls over the last few weeks.    In 12/15, she was seen by Truitt Merle with chest pain and dyspnea.  Lexiscan Cardiolite was done in 12/15 showing normal EF but areas of ischemia concerning for multivessel disease. She was having episodes of chest soreness that occurred daily.  Not related to exertion.  She also had pain in her back related to recent surgery.    I took her for left heart cath in 1/16.  This showed severe distal left main/ostial LCx disease.  She was deemed a poor candidate for CABG due to the difficulty she would have with rehabilitation after the procedure.  She therefore went for bifurcation stenting of the distal LM/ostial LCx with rotational atherectomy.  No significant chest pain.  She walks in her house without dyspnea but is short of breath with long distances.  She continues to have trouble with balance and reports ongoing "dizziness."  There seems to be a positional component.  However, she was not orthostatic when checked today.   ECG: NSR, sinus arrhythmia  Labs (9/14) K 4.4, creatinine 0.81, LDL particle number 1279, LDL 69 Labs (5/15): K 4.5, creatinine 0.8, LDL-P 745, LDL 38 Labs (12/15): K 4, creatinine  0.63 => 0.77, HCT 37.2, BNP 366, LDL 63, HDL 62 Labs (1/16): K 4.7, creatinine 0.77, HCT 34.4  PMH: 1. Chronic low back pain: spinal stenosis.  Spinal fusion with rods in 2/15 at [facility].  Repeat spinal fusion 9/15.  2. Obesity 3. H/o diverticulitis 4. IBS 5. H/o colon polyps 6. GERD: h/o hiatal hernia 7. OSA 8. HTN 9. Hyperlipidemia 10. Depression 11. Cholecystectomy 12. H/o bilateral TKR 13. CAD: LHC (5/09) with 50% ostial LCx, 30-40% mRCA, 30% mLAD.  Lexiscan Cardiolite in 11/14 with EF 68%, no ischemia or infarction. Lexiscan Cardiolite (12/15) with EF 57%, moderate anteroapical and anteroseptal ischemia, inferolateral infarct with peri-infarct ischemia => suggestive of multivessel disease. LHC (1/16) with severe distal left main and ostial LCx stenosis confirmed by FFR.  Patient had bifurcation stenting of the distal LM/ostial LCx with rotational atherectomy.  14. PNA in 3/15 15. Atrial fibrillation: Paroxysmal.  Has only been noted post-op 9/15 spinal fusion.  She had cardioversion, not on anticoagulation.  16. MGUS: Negative bone marrow biopsy 12/15.  17. Anemia  SH: Widow, 2 children, retired Licensed conveyancer, nonsmoker.    FH: CAD in father, mother, brother.    ROS: All systems reviewed and negative except as per HPI.   Current Outpatient Prescriptions  Medication Sig Dispense Refill  . amLODipine (NORVASC) 2.5 MG tablet Take 1 tablet by mouth  daily. For HTN 90 tablet 3  . aspirin EC 81 MG tablet Take 1 tablet (81 mg total) by  mouth daily.    . Calcium Carbonate-Vit D-Min (CALCIUM 1200) 1200-1000 MG-UNIT CHEW Chew 1 each by mouth daily.    . clopidogrel (PLAVIX) 75 MG tablet Take 1 tablet (75 mg total) by mouth daily. 90 tablet 3  . colesevelam (WELCHOL) 625 MG tablet Take 625 mg by mouth daily with breakfast.    . diphenoxylate-atropine (LOMOTIL) 2.5-0.025 MG per tablet Take 1 tablet by mouth 4 (four) times daily as needed for diarrhea or loose stools.     . Eszopiclone 3  MG TABS Take 1 tablet (3 mg total) by mouth at bedtime. For insomnia,take immediately before bedtime. 90 tablet 1  . fentaNYL (DURAGESIC - DOSED MCG/HR) 50 MCG/HR Place 1 patch (50 mcg total) onto the skin every 3 (three) days. 10 patch 0  . FLUoxetine (PROZAC) 40 MG capsule Take 1 capsule (40 mg total) by mouth daily. For depression 90 capsule 3  . furosemide (LASIX) 20 MG tablet Take 1 tablet (20 mg total) by mouth daily. 90 tablet 3  . HYDROcodone-acetaminophen (NORCO) 10-325 MG per tablet Take 1 tablet by mouth every 4 (four) hours as needed for moderate pain.     . iron polysaccharides (NIFEREX) 150 MG capsule Take 1 capsule (150 mg total) by mouth daily. 90 capsule 3  . isosorbide mononitrate (IMDUR) 30 MG 24 hr tablet Take 1 tablet (30 mg total) by mouth daily. 90 tablet 3  . LORazepam (ATIVAN) 0.5 MG tablet Take 1 tablet (0.5 mg total) by mouth 2 (two) times daily as needed for anxiety. 60 tablet 1  . meclizine (ANTIVERT) 25 MG tablet Take 1 tablet (25 mg total) by mouth 3 (three) times daily as needed for dizziness.    . Multiple Vitamin (MULTIVITAMIN) tablet Take 1 tablet by mouth daily. For supplement    . nitroGLYCERIN (NITROSTAT) 0.4 MG SL tablet Place 1 tablet (0.4 mg total) under the tongue every 5 (five) minutes as needed for chest pain (up to 3 doses). 25 tablet 3  . pantoprazole (PROTONIX) 40 MG tablet Take 1 tablet (40 mg total) by mouth daily. 90 tablet 3  . rosuvastatin (CRESTOR) 20 MG tablet Take 0.5-1 tablets (10-20 mg total) by mouth every other day. 20 mg one day alternate 10 mg the next day and repeats     No current facility-administered medications for this visit.   BP 136/82 mmHg  Pulse 65  Ht 5' 7"  (1.702 m)  Wt 189 lb (85.73 kg)  BMI 29.59 kg/m2 General: NAD Neck: No JVD, no thyromegaly or thyroid nodule.  Lungs: Clear to auscultation bilaterally with normal respiratory effort. CV: Nondisplaced PMI.  Heart regular S1/S2, no S3/S4, no murmur.  No peripheral  edema.  No carotid bruit.  Normal pedal pulses.  Abdomen: Soft, nontender, no hepatosplenomegaly, no distention.  Skin: Intact without lesions or rashes.  Neurologic: Alert and oriented x 3.  Psych: Normal affect.  Assessment/Plan: 1. CAD:  She is now s/p bifurcation stenting of the distal LM/ostial LCx with rotational atherectomy in 1/16 as she was deemed a poor candidate for CABG.  She does seem to be having less chest pain and dyspnea now.   - Continue ASA 81, Plavix, and statin.  - I will have her continue Imdur for now.  - I encouraged her to do cardiac rehab at [facility].  2. Hyperlipidemia: Good lipids on Crestor.   3. HTN: BP controlled on current medications.    4. Atrial fibrillation: Paroxysmal.  Has only occurred post-op spinal fusion.  She was not anticoagulated given frequent falls.  I will leave her off for now but if she has recurrent atrial fibrillation, would start anticoagulation.  She was not orthostatic when checked today.  Followup in 3 months.    Loralie Champagne 03/24/2015

## 2015-03-25 ENCOUNTER — Telehealth: Payer: Self-pay

## 2015-03-25 NOTE — Telephone Encounter (Signed)
Erroneous entry

## 2015-03-27 ENCOUNTER — Telehealth: Payer: Self-pay | Admitting: *Deleted

## 2015-03-27 MED ORDER — MECLIZINE HCL 25 MG PO TABS: 25.0000 mg | ORAL_TABLET | Freq: Three times a day (TID) | ORAL | Status: DC | PRN

## 2015-03-27 NOTE — Telephone Encounter (Signed)
Pt was on Meclizine at [place] and wants an rx called into [facility], NOT [facility]

## 2015-04-04 ENCOUNTER — Telehealth: Payer: Self-pay

## 2015-04-04 ENCOUNTER — Other Ambulatory Visit: Payer: Self-pay

## 2015-04-04 MED ORDER — SUVOREXANT 10 MG PO TABS: 1.0000 | ORAL_TABLET | Freq: Every evening | ORAL | Status: DC | PRN

## 2015-04-04 NOTE — Telephone Encounter (Signed)
Insurance denied prior authorization for Lunesta  Patient needs to try Belsomra

## 2015-04-04 NOTE — Telephone Encounter (Signed)
This is the med that the insurance will approve other than [place]

## 2015-04-04 NOTE — Telephone Encounter (Signed)
This is okay to fill this medicine but give her #30 pills, with 2 refills , 10 mg, 1 nightly

## 2015-04-04 NOTE — Telephone Encounter (Signed)
Needs a #10 tab - 10 day trial  This is so we can use a coupon   Please PRINT it out!!!

## 2015-04-18 ENCOUNTER — Telehealth: Payer: Self-pay

## 2015-04-18 NOTE — Telephone Encounter (Signed)
[facility] prior authorized Lunesta 3 mg after she tried and failed Belsomra  Approved till the end of the year

## 2015-04-22 ENCOUNTER — Telehealth: Payer: Self-pay | Admitting: Family Medicine

## 2015-04-22 MED ORDER — ESZOPICLONE 3 MG PO TABS: 3.0000 mg | ORAL_TABLET | Freq: Every day | ORAL | Status: DC

## 2015-04-22 NOTE — Telephone Encounter (Signed)
printed

## 2015-04-26 ENCOUNTER — Encounter: Payer: Self-pay | Admitting: Gastroenterology

## 2015-05-07 ENCOUNTER — Encounter: Payer: Self-pay | Admitting: Family Medicine

## 2015-05-07 ENCOUNTER — Encounter: Payer: Self-pay | Admitting: *Deleted

## 2015-05-07 ENCOUNTER — Ambulatory Visit (INDEPENDENT_AMBULATORY_CARE_PROVIDER_SITE_OTHER): Payer: Medicare Other | Admitting: Family Medicine

## 2015-05-07 VITALS — BP 128/65 | HR 83 | Temp 97.9°F | Ht 67.0 in | Wt 189.0 lb

## 2015-05-07 DIAGNOSIS — R232 Flushing: Secondary | ICD-10-CM

## 2015-05-07 DIAGNOSIS — M5440 Lumbago with sciatica, unspecified side: Secondary | ICD-10-CM

## 2015-05-07 DIAGNOSIS — I10 Essential (primary) hypertension: Secondary | ICD-10-CM | POA: Diagnosis not present

## 2015-05-07 DIAGNOSIS — F4321 Adjustment disorder with depressed mood: Secondary | ICD-10-CM | POA: Diagnosis not present

## 2015-05-07 DIAGNOSIS — D509 Iron deficiency anemia, unspecified: Secondary | ICD-10-CM

## 2015-05-07 DIAGNOSIS — E785 Hyperlipidemia, unspecified: Secondary | ICD-10-CM | POA: Diagnosis not present

## 2015-05-07 DIAGNOSIS — K219 Gastro-esophageal reflux disease without esophagitis: Secondary | ICD-10-CM | POA: Diagnosis not present

## 2015-05-07 DIAGNOSIS — N951 Menopausal and female climacteric states: Secondary | ICD-10-CM

## 2015-05-07 DIAGNOSIS — E559 Vitamin D deficiency, unspecified: Secondary | ICD-10-CM

## 2015-05-07 NOTE — Progress Notes (Signed)
Tammy,  Can you review the patient's medication list and see if she would be ok to start [facility].  She is having hot flashes.  Thank you

## 2015-05-07 NOTE — Progress Notes (Signed)
Subjective:    Patient ID: [name] [name], female    DOB: July 26, 1941, 74 y.o.   MRN: [id number]  HPI Pt here for follow up and management of chronic medical problems which includes GERD, Hypertension, and hyperlipidemia. She is taking medications regularly. She complains today of nervousness and anxiety and continued problems with her back. She does complain of some hot flashes also. She thinks the Imdur is responsible for this and it could be. The patient has been through a lot. She is had multiple back surgeries rehabilitation treatments and cardiovascular issues. She seems to be somewhat phobic about wanting to get out and stretch her wings some more and strengthen herself some more and she needs to be encouraged to do this and the family has tried to do this. She denies any chest pain shortness of breath trouble swallowing heartburn indigestion nausea vomiting or diarrhea. She is also having no problems with voiding. She just has back pain and weakness and gait instability.      Patient Active Problem List   Diagnosis Date Noted  . DDD (degenerative disc disease), lumbar 03/22/2015  . Breathlessness on exertion 03/22/2015  . Can't get food down 03/22/2015  . Fall 03/22/2015  . Acid reflux 03/22/2015  . Cardiac murmur 03/22/2015  . 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 Difficulty hearing 03/22/2015  . Adaptive colitis 03/22/2015  . Lichen 35/46/5681  . Lumbar scoliosis 03/22/2015  . Fracture of pelvis 03/22/2015  . PONV (postoperative nausea and vomiting) 03/22/2015  . Herpes zona 03/22/2015  . History of other specified conditions presenting hazards to health 03/22/2015  . CAD s/p rot atherectomy, PTCA/stenting of ostial Cx and LM into LAD (crush technique), IVUS of LAD/LM 12/2014 01/08/2015  . Anemia 01/08/2015  . MGUS (monoclonal gammopathy of unknown significance) 01/08/2015  . Ischemic chest pain   . Paroxysmal atrial fibrillation 12/22/2014  . CAD (coronary artery disease)  12/22/2014  . Memory disorder 12/04/2014  . Deficiency anemia 11/10/2014  . Vertigo 10/27/2014  . Angulation of spine 09/18/2014  . Metabolic syndrome 27/51/7001  . HCAP (healthcare-associated pneumonia) 03/27/2014  . S/P spinal fusion 02/23/2014  . Acute blood loss anemia 02/23/2014  . Chronic pain associated with significant psychosocial dysfunction 02/23/2014  . Preoperative evaluation of a medical condition to rule out surgical contraindications (TAR required) 10/09/2013  . Abdominal pain 07/06/2013  . Gastroesophageal hernia 07/06/2013  . Incontinence 10/13/2012  . History of IBS 01/26/2012  . 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 EDEMA 06/25/2010  . DEGENERATIVE JOINT DISEASE 04/08/2010  . OTHER DISORDERS OF NERVOUS SYSTEM&SENSE ORGANS 04/08/2010  . HYPERCHOLESTEROLEMIA  IIA 04/08/2009  . Obesity (BMI 30.0-34.9) 04/04/2009  . CIRCADIAN RHYTHM SLEEP D/O DELAY SLEEP PHSE TYPE 04/04/2009  . CARPAL TUNNEL SYNDROME 04/04/2009  . Coronary atherosclerosis 04/04/2009  . DIASTOLIC HEART FAILURE, CHRONIC 04/04/2009  . Diarrhea 01/17/2009  . IRRITABLE BOWEL SYNDROME 11/13/2008  . ESOPHAGEAL STRICTURE 11/12/2008  . GERD 11/12/2008  . DUODENITIS, WITHOUT HEMORRHAGE 11/12/2008  . Diaphragmatic hernia without mention of obstruction or gangrene 11/12/2008  . DIVERTICULOSIS, COLON 11/12/2008  . COLONIC POLYPS, HX OF 11/12/2008  . Obstructive sleep apnea 05/23/2008  . DYSPNEA 05/23/2008  . Hyperlipidemia 05/22/2008  . Depression 05/22/2008  . Essential hypertension 05/22/2008  . INSOMNIA 05/22/2008   Outpatient Encounter Prescriptions as of 05/07/2015  Medication Sig  . amLODipine (NORVASC) 2.5 MG tablet Take 1 tablet by mouth  daily. For HTN  . aspirin EC 81 MG tablet Take 1 tablet (81 mg total) by mouth  daily.  . Calcium Carbonate-Vit D-Min (CALCIUM 1200) 1200-1000 MG-UNIT CHEW Chew 1 each by mouth daily.  . clopidogrel (PLAVIX) 75 MG tablet Take 1 tablet (75 mg total) by mouth  daily.  . colesevelam (WELCHOL) 625 MG tablet Take 625 mg by mouth daily with breakfast.  . diphenoxylate-atropine (LOMOTIL) 2.5-0.025 MG per tablet Take 1 tablet by mouth 4 (four) times daily as needed for diarrhea or loose stools.   . Eszopiclone 3 MG TABS Take 1 tablet (3 mg total) by mouth at bedtime. For insomnia,take immediately before bedtime.  . fentaNYL (DURAGESIC - DOSED MCG/HR) 50 MCG/HR Place 1 patch (50 mcg total) onto the skin every 3 (three) days.  Marland Kitchen FLUoxetine (PROZAC) 40 MG capsule Take 1 capsule (40 mg total) by mouth daily. For depression  . furosemide (LASIX) 20 MG tablet Take 1 tablet (20 mg total) by mouth daily.  Marland Kitchen HYDROcodone-acetaminophen (NORCO) 10-325 MG per tablet Take 1 tablet by mouth every 4 (four) hours as needed for moderate pain.   . iron polysaccharides (NIFEREX) 150 MG capsule Take 1 capsule (150 mg total) by mouth daily.  . isosorbide mononitrate (IMDUR) 30 MG 24 hr tablet Take 1 tablet (30 mg total) by mouth daily.  Marland Kitchen LORazepam (ATIVAN) 0.5 MG tablet Take 1 tablet (0.5 mg total) by mouth 2 (two) times daily as needed for anxiety.  . meclizine (ANTIVERT) 25 MG tablet Take 1 tablet (25 mg total) by mouth 3 (three) times daily as needed for dizziness.  . methocarbamol (ROBAXIN) 500 MG tablet Take 1 tablet by mouth 3 (three) times daily as needed. PRN  . Multiple Vitamin (MULTIVITAMIN) tablet Take 1 tablet by mouth daily. For supplement  . nitroGLYCERIN (NITROSTAT) 0.4 MG SL tablet Place 1 tablet (0.4 mg total) under the tongue every 5 (five) minutes as needed for chest pain (up to 3 doses).  . pantoprazole (PROTONIX) 40 MG tablet Take 1 tablet (40 mg total) by mouth daily.  . rosuvastatin (CRESTOR) 20 MG tablet Take 0.5-1 tablets (10-20 mg total) by mouth every other day. 20 mg one day alternate 10 mg the next day and repeats  . Suvorexant (BELSOMRA) 10 MG TABS Take 1 tablet by mouth at bedtime as needed.   No facility-administered encounter medications on file  as of 05/07/2015.     Review of Systems  Constitutional: Negative.   HENT: Negative.   Eyes: Negative.   Respiratory: Negative.   Cardiovascular: Negative.   Gastrointestinal: Negative.   Endocrine: Negative.        Hot flashes  Genitourinary: Negative.   Musculoskeletal: Positive for back pain.  Skin: Negative.   Allergic/Immunologic: Negative.   Neurological: Negative.   Hematological: Negative.   Psychiatric/Behavioral: The patient is nervous/anxious.        Objective:   Physical Exam  Constitutional: She is oriented to person, place, and time. She appears well-developed and well-nourished. She appears distressed.  The patient is alert and cooperative and cheerful at times about her chronic ongoing back pain  HENT:  Head: Normocephalic and atraumatic.  Right Ear: External ear normal.  Left Ear: External ear normal.  Nose: Nose normal.  Mouth/Throat: Oropharynx is clear and moist.  Eyes: Conjunctivae and EOM are normal. Pupils are equal, round, and reactive to light. Right eye exhibits no discharge. Left eye exhibits no discharge. No scleral icterus.  Neck: Normal range of motion. Neck supple. No thyromegaly present.  Cardiovascular: Normal rate, regular rhythm and normal heart sounds.   No murmur heard. At  72/m  Pulmonary/Chest: Effort normal and breath sounds normal. No respiratory distress. She has no wheezes. She has no rales. She exhibits no tenderness.  Abdominal: Soft. Bowel sounds are normal. She exhibits no mass. There is no tenderness. There is no rebound and no guarding.  Musculoskeletal: She exhibits no edema or tenderness.  The patient is using a cane today and a has a somewhat limited range of motion because of all the back surgeries she is had and the pain she is still experiencing from this.  Lymphadenopathy:    She has no cervical adenopathy.  Neurological: She is alert and oriented to person, place, and time. She has normal reflexes. No cranial nerve  deficit.  Skin: Skin is warm and dry. No rash noted.  Psychiatric: She has a normal mood and affect. Her behavior is normal. Judgment and thought content normal.  Despite all the issues that she is having, she does appear to be more positive and not as bad office she was over 18 months ago.  Nursing note and vitals reviewed.  BP 128/65 mmHg  Pulse 83  Temp(Src) 97.9 F (36.6 C) (Oral)  Ht 5' 7"  (1.702 m)  Wt 189 lb (85.73 kg)  BMI 29.59 kg/m2        Assessment & Plan:  1. Vitamin D deficiency -Continue taking current treatment pending results of lab work---please confirm in the computer record how much vitamin D she is taking daily - Vit D  25 hydroxy (rtn osteoporosis monitoring)  2. Gastroesophageal reflux disease, esophagitis presence not specified -The patient is having no problems with reflux today. She should continue with her pantoprazole as she is doing.  3. Hyperlipemia -The patient has been intolerant of multiple statin drugs and does tolerate the Crestor better than any in the past. She will continue this pending results of lab work - NMR, lipoprofile  4. Essential hypertension -The blood pressure is good today and she will continue with her current treatment - BMP8+EGFR - Hepatic function panel  5. Anemia, iron deficiency -She will continue with her current iron preparation pending results of CBC results - Anemia Profile B  6. Hot flashes -We will look into the black coash if she can take this in conjunction with all her other medicines to make sure there is no drug interaction problem  7. Adjustment disorder with depressed mood -The patient should continue with her lorazepam as needed and her current Prozac.  8. Midline low back pain with sciatica, sciatica laterality unspecified -We will try to see if we can encourage her to go back for some more physical therapy and that decision will ultimately be left up to the patient  Meds ordered this encounter    Medications  . methocarbamol (ROBAXIN) 500 MG tablet    Sig: Take 1 tablet by mouth 3 (three) times daily as needed. PRN    Refill:  0   Patient Instructions                       Medicare Annual Wellness Visit  [facility] and the medical providers at [facility] strive to bring you the best medical care.  In doing so we not only want to address your current medical conditions and concerns but also to detect new conditions early and prevent illness, disease and health-related problems.    Medicare offers a yearly Wellness Visit which allows our clinical staff to assess your need for preventative services including immunizations, lifestyle  education, counseling to decrease risk of preventable diseases and screening for fall risk and other medical concerns.    This visit is provided free of charge (no copay) for all Medicare recipients. The clinical pharmacists at [facility] have begun to conduct these Wellness Visits which will also include a thorough review of all your medications.    As you primary medical provider recommend that you make an appointment for your Annual Wellness Visit if you have not done so already this year.  You may set up this appointment before you leave today or you may call back ([phone]) and schedule an appointment.  Please make sure when you call that you mention that you are scheduling your Annual Wellness Visit with the clinical pharmacist so that the appointment may be made for the proper length of time.     Continue current medications. Continue good therapeutic lifestyle changes which include good diet and exercise. Fall precautions discussed with patient. If an FOBT was given today- please return it to our front desk. If you are over 39 years old - you may need Prevnar 42 or the adult Pneumonia vaccine.  Flu Shots are still available at our office. If you still haven't had one please call to set up a nurse visit  to get one.   After your visit with [place] today you will receive a survey in the mail or online from [employer] regarding your care with [place]. Please take a moment to fill this out. Your feedback is very important to [place] as you can help [place] better understand your patient needs as well as improve your experience and satisfaction. WE CARE ABOUT YOU!!!   The patient is encouraged to go next-door to the rehabilitation center and become involved more with strengthening her body her legs or back set that she can become more independent and not have the fovea and fear of falling, she is weak She promises to call the [facility] rehabilitation center and arrange visits for this. She should continue to follow-up with her cardiologist She should follow up as aggressive therapeutic lifestyle changes as possible watching her diet and eating healthy and staying as active as possible    Arrie Senate MD

## 2015-05-07 NOTE — Patient Instructions (Addendum)
Medicare Annual Wellness Visit  [facility] and the medical providers at [facility] strive to bring you the best medical care.  In doing so we not only want to address your current medical conditions and concerns but also to detect new conditions early and prevent illness, disease and health-related problems.    Medicare offers a yearly Wellness Visit which allows our clinical staff to assess your need for preventative services including immunizations, lifestyle education, counseling to decrease risk of preventable diseases and screening for fall risk and other medical concerns.    This visit is provided free of charge (no copay) for all Medicare recipients. The clinical pharmacists at [facility] have begun to conduct these Wellness Visits which will also include a thorough review of all your medications.    As you primary medical provider recommend that you make an appointment for your Annual Wellness Visit if you have not done so already this year.  You may set up this appointment before you leave today or you may call back ([phone]) and schedule an appointment.  Please make sure when you call that you mention that you are scheduling your Annual Wellness Visit with the clinical pharmacist so that the appointment may be made for the proper length of time.     Continue current medications. Continue good therapeutic lifestyle changes which include good diet and exercise. Fall precautions discussed with patient. If an FOBT was given today- please return it to our front desk. If you are over 56 years old - you may need Prevnar 17 or the adult Pneumonia vaccine.  Flu Shots are still available at our office. If you still haven't had one please call to set up a nurse visit to get one.   After your visit with [place] today you will receive a survey in the mail or online from [employer] regarding your care with [place]. Please take a moment to  fill this out. Your feedback is very important to [place] as you can help [place] better understand your patient needs as well as improve your experience and satisfaction. WE CARE ABOUT YOU!!!   The patient is encouraged to go next-door to the rehabilitation center and become involved more with strengthening her body her legs or back set that she can become more independent and not have the fovea and fear of falling, she is weak She promises to call the [facility] and arrange visits for this. She should continue to follow-up with her cardiologist She should follow up as aggressive therapeutic lifestyle changes as possible watching her diet and eating healthy and staying as active as possible

## 2015-05-08 LAB — ANEMIA PROFILE B
Basophils Absolute: 0 10*3/uL (ref 0.0–0.2)
Basos: 0 %
EOS (ABSOLUTE): 0.2 10*3/uL (ref 0.0–0.4)
Eos: 3 %
Ferritin: 135 ng/mL (ref 15–150)
HEMATOCRIT: 42.6 % (ref 34.0–46.6)
Hemoglobin: 14.1 g/dL (ref 11.1–15.9)
IMMATURE GRANS (ABS): 0 10*3/uL (ref 0.0–0.1)
IRON SATURATION: 26 % (ref 15–55)
IRON: 76 ug/dL (ref 27–139)
Immature Granulocytes: 0 %
Lymphocytes Absolute: 2.2 10*3/uL (ref 0.7–3.1)
Lymphs: 35 %
MCH: 30.9 pg (ref 26.6–33.0)
MCHC: 33.1 g/dL (ref 31.5–35.7)
MCV: 93 fL (ref 79–97)
Monocytes Absolute: 0.6 10*3/uL (ref 0.1–0.9)
Monocytes: 10 %
Neutrophils Absolute: 3.3 10*3/uL (ref 1.4–7.0)
Neutrophils: 52 %
Platelets: 188 10*3/uL (ref 150–379)
RBC: 4.56 x10E6/uL (ref 3.77–5.28)
RDW: 14.2 % (ref 12.3–15.4)
RETIC CT PCT: 1.4 % (ref 0.6–2.6)
Total Iron Binding Capacity: 294 ug/dL (ref 250–450)
UIBC: 218 ug/dL (ref 118–369)
Vitamin B-12: 977 pg/mL — ABNORMAL HIGH (ref 211–946)
WBC: 6.3 10*3/uL (ref 3.4–10.8)

## 2015-05-08 LAB — BMP8+EGFR
BUN/Creatinine Ratio: 23 (ref 11–26)
BUN: 18 mg/dL (ref 8–27)
CO2: 28 mmol/L (ref 18–29)
CREATININE: 0.78 mg/dL (ref 0.57–1.00)
Calcium: 9.5 mg/dL (ref 8.7–10.3)
Chloride: 99 mmol/L (ref 97–108)
GFR, EST AFRICAN AMERICAN: 86 mL/min/{1.73_m2} (ref >59)
GFR, EST NON AFRICAN AMERICAN: 75 mL/min/{1.73_m2} (ref >59)
Glucose: 94 mg/dL (ref 65–99)
Potassium: 4.7 mmol/L (ref 3.5–5.2)
SODIUM: 142 mmol/L (ref 134–144)

## 2015-05-08 LAB — NMR, LIPOPROFILE
Cholesterol: 144 mg/dL (ref 100–199)
HDL CHOLESTEROL BY NMR: 61 mg/dL (ref >39)
HDL Particle Number: 36.7 umol/L (ref >=30.5)
LDL PARTICLE NUMBER: 821 nmol/L (ref <1000)
LDL SIZE: 20.2 nm (ref >20.5)
LDL-C: 52 mg/dL (ref 0–99)
LP-IR Score: 55 — ABNORMAL HIGH (ref <=45)
Small LDL Particle Number: 540 nmol/L — ABNORMAL HIGH (ref <=527)
TRIGLYCERIDES BY NMR: 156 mg/dL — AB (ref 0–149)

## 2015-05-08 LAB — HEPATIC FUNCTION PANEL
ALT: 11 IU/L (ref 0–32)
AST: 15 IU/L (ref 0–40)
Albumin: 4.5 g/dL (ref 3.5–4.8)
Alkaline Phosphatase: 85 IU/L (ref 39–117)
BILIRUBIN TOTAL: 0.4 mg/dL (ref 0.0–1.2)
BILIRUBIN, DIRECT: 0.14 mg/dL (ref 0.00–0.40)
Total Protein: 6.7 g/dL (ref 6.0–8.5)

## 2015-05-08 LAB — VITAMIN D 25 HYDROXY (VIT D DEFICIENCY, FRACTURES): Vit D, 25-Hydroxy: 32.2 ng/mL (ref 30.0–100.0)

## 2015-05-08 NOTE — Progress Notes (Signed)
Please let the patient be aware of the fact that she cannot use this supplement for hot flashes because of its side effect

## 2015-05-08 NOTE — Progress Notes (Signed)
Pt's daughter [name] aware - not to use black cohosh

## 2015-05-08 NOTE — Progress Notes (Signed)
I would not recommend Black Cohosh due possible increase in clotting with any estrogenic compound.  Patient is taking clopidogrel for stent / CAD.   Possible alternatives to treating hot flashes is change fluoxetine to either escitalopram 20mg  1 tablet daily or venlafaxine XR 150mg  daily which have been shown to help with menopausal hot flashes.

## 2015-05-09 ENCOUNTER — Telehealth: Payer: Self-pay | Admitting: *Deleted

## 2015-05-09 NOTE — Telephone Encounter (Signed)
-----   Message from [name], MD sent at 05/08/2015  7:13 AM EDT ----- All liver function tests are within normal limits The total LDL particle number with advanced lipid testing is excellent and at goal at 821. The LDL C is good at 52. The triglycerides are slightly elevated at 156 but lower than 8 months ago. The good cholesterol or the HDL particle number is higher than it has been recently. And this is also good. The patient should continue with her current treatment and with as aggressive therapeutic lifestyle changes as possible which include diet and yes exercise. Please make sure that she makes arrangements to go to the rehabilitation center next-door to help strengthen her and exercise more The vitamin D level is 32.2 and this is at the low end of the normal range.----- please confirm with patient that she is taking vitamin D3 at least 1000 units daily The total iron binding capacity was within normal limits. The serum iron was within normal limits the ferritin level was good. Vitamin B12 level was good. The hemoglobin was excellent at 14.1 and white blood cell count was not elevated and the platelet count was adequate.   LAB-----please note the serial absence of hemoglobins The blood sugar is good at 94. The creatinine, the most important kidney function test is within normal limits. Electrolytes including potassium are good.

## 2015-05-09 NOTE — Telephone Encounter (Signed)
Pt notified of results Verbalizes understanding 

## 2015-06-10 ENCOUNTER — Ambulatory Visit: Payer: Medicare Other | Admitting: Neurology

## 2015-06-20 ENCOUNTER — Ambulatory Visit (INDEPENDENT_AMBULATORY_CARE_PROVIDER_SITE_OTHER): Payer: Medicare Other | Admitting: Neurology

## 2015-06-20 ENCOUNTER — Encounter: Payer: Self-pay | Admitting: Neurology

## 2015-06-20 VITALS — BP 129/71 | HR 64 | Ht 67.0 in | Wt 195.8 lb

## 2015-06-20 DIAGNOSIS — R413 Other amnesia: Secondary | ICD-10-CM | POA: Diagnosis not present

## 2015-06-20 DIAGNOSIS — R42 Dizziness and giddiness: Secondary | ICD-10-CM

## 2015-06-20 MED ORDER — DONEPEZIL HCL 5 MG PO TABS: 5.0000 mg | ORAL_TABLET | Freq: Every day | ORAL | Status: DC

## 2015-06-20 NOTE — Patient Instructions (Signed)
   Begin Aricept (donepezil) at 5 mg at night for one month. If this medication is well-tolerated, please call our office and we will call in a prescription for the 10 mg tablets. Look out for side effects that may include nausea, diarrhea, weight loss, or stomach cramps. This medication will also cause a runny nose, therefore there is no need for allergy medications for this purpose.  Vertigo Vertigo means you feel like you or your surroundings are moving when they are not. Vertigo can be dangerous if it occurs when you are at work, driving, or performing difficult activities.  CAUSES  Vertigo occurs when there is a conflict of signals sent to your brain from the visual and sensory systems in your body. There are many different causes of vertigo, including:  Infections, especially in the inner ear.  A bad reaction to a drug or misuse of alcohol and medicines.  Withdrawal from drugs or alcohol.  Rapidly changing positions, such as lying down or rolling over in bed.  A migraine headache.  Decreased blood flow to the brain.  Increased pressure in the brain from a head injury, infection, tumor, or bleeding. SYMPTOMS  You may feel as though the world is spinning around or you are falling to the ground. Because your balance is upset, vertigo can cause nausea and vomiting. You may have involuntary eye movements (nystagmus). DIAGNOSIS  Vertigo is usually diagnosed by physical exam. If the cause of your vertigo is unknown, your caregiver may perform imaging tests, such as an MRI scan (magnetic resonance imaging). TREATMENT  Most cases of vertigo resolve on their own, without treatment. Depending on the cause, your caregiver may prescribe certain medicines. If your vertigo is related to body position issues, your caregiver may recommend movements or procedures to correct the problem. In rare cases, if your vertigo is caused by certain inner ear problems, you may need surgery. HOME CARE INSTRUCTIONS    Follow your caregiver's instructions.  Avoid driving.  Avoid operating heavy machinery.  Avoid performing any tasks that would be dangerous to you or others during a vertigo episode.  Tell your caregiver if you notice that certain medicines seem to be causing your vertigo. Some of the medicines used to treat vertigo episodes can actually make them worse in some people. SEEK IMMEDIATE MEDICAL CARE IF:   Your medicines do not relieve your vertigo or are making it worse.  You develop problems with talking, walking, weakness, or using your arms, hands, or legs.  You develop severe headaches.  Your nausea or vomiting continues or gets worse.  You develop visual changes.  A family member notices behavioral changes.  Your condition gets worse. MAKE SURE YOU:  Understand these instructions.  Will watch your condition.  Will get help right away if you are not doing well or get worse. Document Released: 09/16/2005 Document Revised: 02/29/2012 Document Reviewed: 06/25/2011 [facility] Patient Information 2015 [place], [place]. This information is not intended to replace advice given to you by your health care provider. Make sure you discuss any questions you have with your health care provider.

## 2015-06-20 NOTE — Progress Notes (Signed)
Reason for visit: Memory disorder  [name] [name] is an 74 y.o. female  History of present illness:  [name] is a 74 year old right-handed white female with a history of multiple medical issues. The patient has chronic low back pain, irritable bowel syndrome, episodic vertigo, and a progressive memory disorder. The patient has had ongoing issues repeating herself, and difficulty remembering names for people and things. She oftentimes gets lost in the middle of a conversation. She indicates that the memory issues have gradually worsened over time. The patient was seen in December 2015, she did not wish to go on medications for memory at that time. She also reports episodic vertigo which may be positional, occurring with rolling on one side or the other, and this may occur when she sits up. She takes meclizine for this. The patient is on a fentanyl patch, and she takes muscle relaxants on occasion. She returns to this office for an evaluation. She needs some assistance keeping up with medications and appointments. She does drive a car without difficulty.  Past Medical History  Diagnosis Date  . Obesity   . Circadian rhythm sleep disorder   . CTS (carpal tunnel syndrome)   . Diarrhea   . IBS (irritable bowel syndrome)   . Diverticulitis of colon   . Gastritis   . Esophageal stricture   . Personal history of colonic polyps 10/25/2011 & 12/02/11    not retrieved Dr Lyla Son & tubular adenomas  . Hiatal hernia   . GERD (gastroesophageal reflux disease)   . Insomnia   . HTN (hypertension)   . Hyperlipidemia   . Depression   . Chronic diastolic CHF (congestive heart failure)   . Memory disorder 12/04/2014  . Pneumonia 03/2014  . Obstructive sleep apnea     "have mask; don't wear it" (01/07/2015)  . Anemia   . History of blood transfusion     "most of them related to OR's"   . MGUS (monoclonal gammopathy of unknown significance) dx'd 11/2014    a. Neg BMB 11/2014.  . Stroke early  2000's    "small"; denies residual on 01/07/2015)  . Arthritis     "knees, back, fingers, toes; joints" (01/07/2015)  . Chronic back pain greater than 3 months duration     a. spinal stenosis. Spinal fusion with rods in 2/15 at [facility] spinal fusion 9/15.  Marland Kitchen Anxiety   . CAD (coronary artery disease)     a. Abnl nuc 11/2014. Cath 12/2014 - turned down for CABG. Ultimately s/p TTVP, rotational atherectomy, PTCA and stenting of the ostial LCx and left main into the LAD (crush technique), and IVUS of the LAD/Left main.  Marland Kitchen PAF (paroxysmal atrial fibrillation)     a.  Paroxysmal. Has only been noted post-op 9/15 spinal fusion. She had cardioversion, not on anticoagulation. Fall risk, unsteady.  . Sinus bradycardia     a. Baseline HR 50s-60s.  Marland Kitchen PAT (paroxysmal atrial tachycardia)   . Multiple falls   . Orthostasis   . PONV (postoperative nausea and vomiting) 03/22/2015    Past Surgical History  Procedure Laterality Date  . Carpal tunnel release Right 1980's  . Total knee arthroplasty Bilateral 1990's - 2000's  . Cataract extraction Bilateral   . Left heart catheterization with coronary angiogram N/A 12/27/2014    Procedure: LEFT HEART CATHETERIZATION WITH CORONARY ANGIOGRAM;  Surgeon: Larey Dresser, MD;  Location: [facility] CATH LAB;  Service: Cardiovascular;  Laterality: N/A;  . Percutaneous coronary rotoblator  intervention (pci-r)  01/07/2015  . Cardiac catheterization  12/27/2014    Procedure: INTRAVASCULAR PRESSURE WIRE/FFR STUDY;  Surgeon: Larey Dresser, MD;  Location: [facility] CATH LAB;  Service: Cardiovascular;;  . Coronary angioplasty with stent placement  01/07/2015    "2"  . Laparoscopic cholecystectomy  2003  . Joint replacement    . Knee arthroscopy Left 1995  . Back surgery    . Posterior lumbar fusion  01/2015  . Posterior fusion thoracic spine  08/2015  . Dilation and curettage of uterus    . Cataract extraction w/ intraocular lens  implant, bilateral  2000's  .  Esophagogastroduodenoscopy (egd) with esophageal dilation  "several times"  . Bone marrow biopsy  11/2014  . Percutaneous coronary rotoblator intervention (pci-r) N/A 01/07/2015    Procedure: PERCUTANEOUS CORONARY ROTOBLATOR INTERVENTION (PCI-R);  Surgeon: Blane Ohara, MD;  Location: [facility] CATH LAB;  Service: Cardiovascular;  Laterality: N/A;  . Coronary stent placement      Family History  Problem Relation Age of Onset  . Coronary artery disease Father   . Peripheral vascular disease Father   . Coronary artery disease Mother   . Coronary artery disease Brother   . Colon cancer Sister   . Emphysema Sister   . Sleep apnea Son   . Colon cancer Sister     spread to her brain  . Dementia Neg Hx   . Emphysema Brother     Social history:  reports that she has never smoked. She has never used smokeless tobacco. She reports that she does not drink alcohol or use illicit drugs.    Allergies  Allergen Reactions  . Cymbalta [Duloxetine Hcl] Other (See Comments)    Personality changes - crying  2014  . Morphine And Related Itching  . Oxycodone-Acetaminophen Other (See Comments)    Personality changes   . Valium [Diazepam] Other (See Comments)    Personality changes - "in another world" ;  Hallucinations  2015  . Altace [Ramipril] Other (See Comments)    unknown  . Floxin [Ofloxacin] Other (See Comments)    unknown  . Lipitor [Atorvastatin] Other (See Comments)    Muscle weakness   . Lovaza [Omega-3-Acid Ethyl Esters] Other (See Comments)    Muscle weakness   . Pravachol [Pravastatin Sodium] Other (See Comments)    Muscle weakness   . Trilipix [Choline Fenofibrate] Other (See Comments)    Muscle weakness   . Zetia [Ezetimibe] Other (See Comments)    Muscle weakness   . Zocor [Simvastatin] Other (See Comments)    Muscle weakness     Medications:  Prior to Admission medications   Medication Sig Start Date End Date Taking? Authorizing Provider  amLODipine (NORVASC) 2.5 MG  tablet Take 1 tablet by mouth  daily. For HTN 11/13/14  Yes [name], MD  aspirin EC 81 MG tablet Take 1 tablet (81 mg total) by mouth daily. 01/08/15  Yes Dayna N Dunn, PA-C  Calcium Carbonate-Vit D-Min (CALCIUM 1200) 1200-1000 MG-UNIT CHEW Chew 1 each by mouth daily.   Yes Historical Provider, MD  clopidogrel (PLAVIX) 75 MG tablet Take 1 tablet (75 mg total) by mouth daily. 03/22/15  Yes Larey Dresser, MD  colesevelam [facility]) 625 MG tablet Take 625 mg by mouth daily with breakfast.   Yes Historical Provider, MD  diphenoxylate-atropine (LOMOTIL) 2.5-0.025 MG per tablet Take 1 tablet by mouth 4 (four) times daily as needed for diarrhea or loose stools.  08/17/14  Yes Historical Provider, MD  Eszopiclone 3 MG TABS Take 1 tablet (3 mg total) by mouth at bedtime. For insomnia,take immediately before bedtime. 04/22/15  Yes [name], MD  fentaNYL (DURAGESIC - DOSED MCG/HR) 50 MCG/HR Place 1 patch (50 mcg total) onto the skin every 3 (three) days. 10/31/14  Yes [name], MD  FLUoxetine (PROZAC) 40 MG capsule Take 1 capsule (40 mg total) by mouth daily. For depression 11/13/14  Yes [name], MD  furosemide (LASIX) 20 MG tablet Take 1 tablet (20 mg total) by mouth daily. 11/13/14  Yes [name], MD  HYDROcodone-acetaminophen [facility]) 10-325 MG per tablet Take 1 tablet by mouth every 4 (four) hours as needed for moderate pain.    Yes Historical Provider, MD  iron polysaccharides (NIFEREX) 150 MG capsule Take 1 capsule (150 mg total) by mouth daily. 11/13/14  Yes [name], MD  isosorbide mononitrate (IMDUR) 30 MG 24 hr tablet Take 1 tablet (30 mg total) by mouth daily. 03/22/15  Yes Larey Dresser, MD  LORazepam (ATIVAN) 0.5 MG tablet Take 1 tablet (0.5 mg total) by mouth 2 (two) times daily as needed for anxiety. 10/31/14  Yes [name], MD  meclizine (ANTIVERT) 25 MG tablet Take 1 tablet (25 mg total) by mouth 3 (three) times daily as needed for dizziness. 03/27/15  Yes [name], MD  methocarbamol (ROBAXIN) 500 MG tablet Take 1 tablet by mouth 3 (three) times daily as needed. PRN 03/27/15  Yes Historical Provider, MD  Multiple Vitamin (MULTIVITAMIN) tablet Take 1 tablet by mouth daily. For supplement   Yes Historical Provider, MD  nitroGLYCERIN (NITROSTAT) 0.4 MG SL tablet Place 1 tablet (0.4 mg total) under the tongue every 5 (five) minutes as needed for chest pain (up to 3 doses). 01/08/15  Yes Dayna N Dunn, PA-C  pantoprazole (PROTONIX) 40 MG tablet Take 1 tablet (40 mg total) by mouth daily. 03/22/15  Yes Larey Dresser, MD  rosuvastatin (CRESTOR) 20 MG tablet Take 0.5-1 tablets (10-20 mg total) by mouth every other day. 20 mg one day alternate 10 mg the next day and repeats 01/08/15  Yes Dayna N Dunn, PA-C    ROS:  Out of a complete 14 system review of symptoms, the patient complains only of the following symptoms, and all other reviewed systems are negative.  Memory disturbance Chronic low back pain Vertigo  Blood pressure 129/71, pulse 64, height _0  (1.702 m), weight 195 lb 12.8 oz (88.814 kg).  Physical Exam  General: The patient is alert and cooperative at the time of the examination. The patient is moderately obese.  Skin: 1+ edema at the ankles is noted bilaterally.   Neurologic Exam  Mental status: The patient is alert and oriented x 3 at the time of the examination. The patient has apparent normal recent and remote memory, with an apparently normal attention span and concentration ability. Mini-Mental Status Examination done today shows a total score of 28/30. The patient is able to name 9 animals in 30 seconds.   Cranial nerves: Facial symmetry is present. Speech is normal, no aphasia or dysarthria is noted. Extraocular movements are full. Visual fields are full.  Motor: The patient has good strength in all 4 extremities.  Sensory examination: Soft touch sensation is symmetric on the face, arms, and legs.  Coordination: The patient has  good finger-nose-finger and heel-to-shin bilaterally.  Gait and station: The patient has a slightly wide-based, unsteady gait. The patient walks with a walker. Tandem  gait was not attempted. Romberg is negative. No drift is seen.  Reflexes: Deep tendon reflexes are symmetric.   Assessment/Plan:  1. Memory disturbance  2. Episodic vertigo  3. Gait disturbance  4. Chronic low back pain  The patient is amenable to starting medications for memory. I will call in a prescription for Aricept, but if this adversely affects her bowel function, she will contact our office. The patient is not interested in a referral for physical therapy for vestibular rehabilitation. She will follow-up through this office in about 6 months, sooner if needed.  Jill Alexanders MD 06/20/2015 7:28 PM  [facility] [address] [place], [place] [address]  Phone [phone] Fax [phone]

## 2015-06-26 ENCOUNTER — Ambulatory Visit: Payer: Medicare Other | Admitting: Neurology

## 2015-07-02 ENCOUNTER — Encounter: Payer: Self-pay | Admitting: Hematology

## 2015-07-02 ENCOUNTER — Ambulatory Visit (HOSPITAL_BASED_OUTPATIENT_CLINIC_OR_DEPARTMENT_OTHER): Payer: Medicare Other

## 2015-07-02 ENCOUNTER — Ambulatory Visit (HOSPITAL_BASED_OUTPATIENT_CLINIC_OR_DEPARTMENT_OTHER): Payer: Medicare Other | Admitting: Hematology

## 2015-07-02 ENCOUNTER — Telehealth: Payer: Self-pay | Admitting: Hematology

## 2015-07-02 VITALS — BP 136/67 | HR 72 | Temp 98.6°F | Resp 18 | Ht 67.0 in | Wt 196.5 lb

## 2015-07-02 DIAGNOSIS — D509 Iron deficiency anemia, unspecified: Secondary | ICD-10-CM

## 2015-07-02 DIAGNOSIS — D472 Monoclonal gammopathy: Secondary | ICD-10-CM | POA: Diagnosis not present

## 2015-07-02 DIAGNOSIS — D649 Anemia, unspecified: Secondary | ICD-10-CM

## 2015-07-02 DIAGNOSIS — D500 Iron deficiency anemia secondary to blood loss (chronic): Secondary | ICD-10-CM | POA: Insufficient documentation

## 2015-07-02 HISTORY — DX: Iron deficiency anemia, unspecified: D50.9

## 2015-07-02 LAB — CBC & DIFF AND RETIC
BASO%: 0.1 % (ref 0.0–2.0)
Basophils Absolute: 0 10*3/uL (ref 0.0–0.1)
EOS ABS: 0.2 10*3/uL (ref 0.0–0.5)
EOS%: 2.3 % (ref 0.0–7.0)
HEMATOCRIT: 41.4 % (ref 34.8–46.6)
HEMOGLOBIN: 13.8 g/dL (ref 11.6–15.9)
Immature Retic Fract: 4.5 % (ref 1.60–10.00)
LYMPH%: 26 % (ref 14.0–49.7)
MCH: 31.1 pg (ref 25.1–34.0)
MCHC: 33.3 g/dL (ref 31.5–36.0)
MCV: 93.2 fL (ref 79.5–101.0)
MONO#: 0.5 10*3/uL (ref 0.1–0.9)
MONO%: 7.7 % (ref 0.0–14.0)
NEUT%: 63.9 % (ref 38.4–76.8)
NEUTROS ABS: 4.4 10*3/uL (ref 1.5–6.5)
Platelets: 177 10*3/uL (ref 145–400)
RBC: 4.44 10*6/uL (ref 3.70–5.45)
RDW: 12.8 % (ref 11.2–14.5)
Retic %: 1.63 % (ref 0.70–2.10)
Retic Ct Abs: 72.37 10*3/uL (ref 33.70–90.70)
WBC: 6.9 10*3/uL (ref 3.9–10.3)
lymph#: 1.8 10*3/uL (ref 0.9–3.3)

## 2015-07-02 LAB — FERRITIN CHCC: FERRITIN: 142 ng/mL (ref 9–269)

## 2015-07-02 LAB — COMPREHENSIVE METABOLIC PANEL (CC13)
ALT: 19 U/L (ref 0–55)
AST: 22 U/L (ref 5–34)
Albumin: 3.6 g/dL (ref 3.5–5.0)
Alkaline Phosphatase: 85 U/L (ref 40–150)
Anion Gap: 8 mEq/L (ref 3–11)
BUN: 17.6 mg/dL (ref 7.0–26.0)
CHLORIDE: 104 meq/L (ref 98–109)
CO2: 28 mEq/L (ref 22–29)
Calcium: 9.5 mg/dL (ref 8.4–10.4)
Creatinine: 0.8 mg/dL (ref 0.6–1.1)
EGFR: 73 mL/min/{1.73_m2} — ABNORMAL LOW (ref >90)
GLUCOSE: 115 mg/dL (ref 70–140)
Potassium: 4.2 mEq/L (ref 3.5–5.1)
Sodium: 140 mEq/L (ref 136–145)
Total Bilirubin: 0.46 mg/dL (ref 0.20–1.20)
Total Protein: 6.6 g/dL (ref 6.4–8.3)

## 2015-07-02 LAB — IRON AND TIBC CHCC
%SAT: 30 % (ref 21–57)
Iron: 83 ug/dL (ref 41–142)
TIBC: 281 ug/dL (ref 236–444)
UIBC: 198 ug/dL (ref 120–384)

## 2015-07-02 NOTE — Progress Notes (Signed)
[place] FOLLOW UP NOTE  Patient Care Team: [name], MD as PCP - General Suella Broad, MD (Physical Medicine and Rehabilitation) Hillary Bow, MD (Cardiology) Newton Pigg, MD (Obstetrics and Gynecology)  CHIEF COMPLAINTS Anemia   INITIAL PRESENTATION (first consult):  [name] [name] 74 y.o. female is being referred by Dr. Deatra Ina because of anemia.   She had lumbar and thoracic spine fusion surgery for kyphosis in Feb 2015 and Sep 2015, and required blood transfusion 2u each time after surgery. She was discharged to rehab after surgery, and she received 2u RBC in Nov 4th, 2015 for Hb 7.8g/dl. She was fatigued when her Hg was low, but still able to do some rehab. She had multiple full in the past few month and was hospitalized last month. She still has diffuse body pain from the recent fall, quite fatigued, is only able to walk a short distance, (+) dyspnea and chest heaviness on exertion. No nausea, no change of her bowel habits. Her appetite is moderate to low, she lost about 15-20lbs in the past year.  She denied any bleeding episodes including hematochezia, melana, hemoptysis, hematuria or epitaxis. No mucosal bleeding or easy bruising. She was finally discharged home on 10/30/14, still getting home PT/OT. She came in today with a wheelchair.   Per her daughter, she has had intermittent confusion and anxiety lately, she seems to have some cognitive function dificiency also. Pt her self contributes to some of her medications.   She has last colonoscopy about 3 years ago, she probably had polyps removed. Her last mammogram was one year ago which was normal. Per her daughter, she has a normal CT abdomen and pelvis about 5-6 month ago which was ordered for some abdominal symptoms. She has been on iron pill (1 daily) a few weeks ago.    INTERVAL HISTORY  She returns for follow up. She had cardiac stents placement in jan 2016. She is doing better overall in the past  6 months. She is able to move around more, still uses walker when she goes out. She states she has balance issue, no fall recently. She still has some dizziness since her back surgery last year. She has chronic back pain, stable overall. No other new pain. She has good appetite, energy level is low, not sure if it's related to her Arecpt. She gained about 10lbs in the past 6 months.   MEDICAL HISTORY:  Past Medical History  Diagnosis Date  . Obesity   . Atrial fibrillation    . CTS (carpal tunnel syndrome)   . Diarrhea   . IBS (irritable bowel syndrome)   . Diverticulitis of colon   . Gastritis   . Esophageal stricture, s/p dilation    . Personal history of colonic polyps 10/25/2011 & 12/02/11    not retrieved Dr Lyla Son & tubular adenomas  . Hiatal hernia   . GERD (gastroesophageal reflux disease)   . Obstructive sleep apnea   . Insomnia   . HTN (hypertension)   . Hyperlipidemia   . Depression/anxiety    . DIASTOLIC HEART FAILURE, CHRONIC 04/04/2009    Qualifier: Diagnosis of  By: Mare Ferrari, RMA, Sherri      SURGICAL HISTORY: Past Surgical History  Procedure Laterality Date  . Carpal tunnel release Right 1980's  . Total knee arthroplasty Bilateral 1990's - 2000's  . Cataract extraction Bilateral   . Left heart catheterization with coronary angiogram N/A 12/27/2014    Procedure: LEFT HEART CATHETERIZATION WITH  CORONARY ANGIOGRAM;  Surgeon: Larey Dresser, MD;  Location: [facility] CATH LAB;  Service: Cardiovascular;  Laterality: N/A;  . Percutaneous coronary rotoblator intervention (pci-r)  01/07/2015  . Cardiac catheterization  12/27/2014    Procedure: INTRAVASCULAR PRESSURE WIRE/FFR STUDY;  Surgeon: Larey Dresser, MD;  Location: [facility] CATH LAB;  Service: Cardiovascular;;  . Coronary angioplasty with stent placement  01/07/2015    "2"  . Laparoscopic cholecystectomy  2003  . Joint replacement    . Knee arthroscopy Left 1995  . Back surgery    . Posterior lumbar fusion  01/2015  .  Posterior fusion thoracic spine  08/2015  . Dilation and curettage of uterus    . Cataract extraction w/ intraocular lens  implant, bilateral  2000's  . Esophagogastroduodenoscopy (egd) with esophageal dilation  "several times"  . Bone marrow biopsy  11/2014  . Percutaneous coronary rotoblator intervention (pci-r) N/A 01/07/2015    Procedure: PERCUTANEOUS CORONARY ROTOBLATOR INTERVENTION (PCI-R);  Surgeon: Blane Ohara, MD;  Location: [facility] C[facility] LAB;  Service: Cardiovascular;  Laterality: N/A;  . Coronary stent placement      SOCIAL HISTORY: History   Social History  . Marital Status: Widowed    Spouse Name: N/A  . Number of Children: 2  . Years of Education: HS   Occupational History  . librarian- retired     retired   Social History Main Topics  . Smoking status: Never Smoker   . Smokeless tobacco: Never Used  . Alcohol Use: No     Comment: 01/07/2015 "glass of wine at Christmas, maybe"  . Drug Use: No  . Sexual Activity: No   Other Topics Concern  . Not on file   Social History Narrative   Patient is right handed   Patient drinks 1-2 sodas daily.   patlient lives alone.       FAMILY HISTORY: Family History  Problem Relation Age of Onset  . Coronary artery disease Father   . Peripheral vascular disease Father   . Coronary artery disease Mother   . Coronary artery disease Brother   . Colon cancer Sister at 33   . Emphysema Sister   . Sleep apnea Son   . Colon cancer Sister at 81     spread to her brain  No family history of blood disorders or other malignancy   ALLERGIES:  is allergic to cymbalta; morphine and related; oxycodone-acetaminophen; valium; altace; floxin; lipitor; lovaza; pravachol; trilipix; zetia; and zocor.  MEDICATIONS:  Current Outpatient Prescriptions  Medication Sig Dispense Refill  . amLODipine (NORVASC) 2.5 MG tablet Take 1 tablet by mouth  daily. For HTN 90 tablet 3  . aspirin EC 81 MG tablet Take 1 tablet (81 mg total) by mouth daily.     . Calcium Carbonate-Vit D-Min (CALCIUM 1200) 1200-1000 MG-UNIT CHEW Chew 1 each by mouth daily.    . clopidogrel (PLAVIX) 75 MG tablet Take 1 tablet (75 mg total) by mouth daily. 90 tablet 3  . colesevelam (WELCHOL) 625 MG tablet Take 625 mg by mouth daily with breakfast.    . donepezil (ARICEPT) 5 MG tablet Take 1 tablet (5 mg total) by mouth at bedtime. 30 tablet 1  . Eszopiclone 3 MG TABS Take 1 tablet (3 mg total) by mouth at bedtime. For insomnia,take immediately before bedtime. 90 tablet 1  . fentaNYL (DURAGESIC - DOSED MCG/HR) 50 MCG/HR Place 1 patch (50 mcg total) onto the skin every 3 (three) days. 10 patch 0  . FLUoxetine (PROZAC)  40 MG capsule Take 1 capsule (40 mg total) by mouth daily. For depression 90 capsule 3  . furosemide (LASIX) 20 MG tablet Take 1 tablet (20 mg total) by mouth daily. 90 tablet 3  . HYDROcodone-acetaminophen (NORCO) 10-325 MG per tablet Take 1 tablet by mouth every 4 (four) hours as needed for moderate pain.     . iron polysaccharides (NIFEREX) 150 MG capsule Take 1 capsule (150 mg total) by mouth daily. 90 capsule 3  . isosorbide mononitrate (IMDUR) 30 MG 24 hr tablet Take 1 tablet (30 mg total) by mouth daily. 90 tablet 3  . LORazepam (ATIVAN) 0.5 MG tablet Take 1 tablet (0.5 mg total) by mouth 2 (two) times daily as needed for anxiety. 60 tablet 1  . meclizine (ANTIVERT) 25 MG tablet Take 1 tablet (25 mg total) by mouth 3 (three) times daily as needed for dizziness. 60 tablet 0  . methocarbamol (ROBAXIN) 500 MG tablet Take 1 tablet by mouth 3 (three) times daily as needed. PRN  0  . Multiple Vitamin (MULTIVITAMIN) tablet Take 1 tablet by mouth daily. For supplement    . nitroGLYCERIN (NITROSTAT) 0.4 MG SL tablet Place 1 tablet (0.4 mg total) under the tongue every 5 (five) minutes as needed for chest pain (up to 3 doses). 25 tablet 3  . pantoprazole (PROTONIX) 40 MG tablet Take 1 tablet (40 mg total) by mouth daily. 90 tablet 3  . rosuvastatin (CRESTOR) 20  MG tablet Take 0.5-1 tablets (10-20 mg total) by mouth every other day. 20 mg one day alternate 10 mg the next day and repeats    . diphenoxylate-atropine (LOMOTIL) 2.5-0.025 MG per tablet Take 1 tablet by mouth 4 (four) times daily as needed for diarrhea or loose stools.      No current facility-administered medications for this visit.    REVIEW OF SYSTEMS:   Constitutional: Denies fevers, chills or abnormal night sweats, (+) fatigue  Eyes: Denies blurriness of vision, double vision or watery eyes Ears, nose, mouth, throat, and face: Denies mucositis or sore throat Respiratory: Denies cough, dyspnea or wheezes Cardiovascular: Denies palpitation, chest discomfort or lower extremity swelling Gastrointestinal:  Denies nausea, heartburn or change in bowel habits Skin: Denies abnormal skin rashes Lymphatics: Denies new lymphadenopathy or easy bruising Neurological:Denies numbness, tingling or new weaknesses, (+) intermittent confusion, (+) chronic back pain  Behavioral/Psych: (+) anxiety, no new changes  All other systems were reviewed with the patient and are negative.  PHYSICAL EXAMINATION:  Filed Vitals:   07/02/15 1409  BP: 136/67  Pulse: 72  Temp: 98.6 F (37 C)  Resp: 18   Filed Weights   07/02/15 1409  Weight: 196 lb 8 oz (89.132 kg)    GENERAL:alert, no distress and comfortable, sitting in wheelchair  SKIN: skin color, texture, turgor are normal, no rashes or significant lesions EYES: normal, conjunctiva are pink and non-injected, sclera clear OROPHARYNX:no exudate, no erythema and lips, buccal mucosa, and tongue normal  NECK: supple, thyroid normal size, non-tender, without nodularity LYMPH:  no palpable lymphadenopathy in the cervical, axillary or inguinal LUNGS: clear to auscultation and percussion with normal breathing effort HEART: regular rate & rhythm and no murmurs and no lower extremity edema ABDOMEN:abdomen soft, non-tender and normal bowel  sounds Musculoskeletal:no cyanosis of digits and no clubbing. (+) surgical scar at her thoracic and lumbar spine  PSYCH: alert & oriented x 3 with fluent speech, but argues with her daughter a lot  NEURO: no focal motor/sensory deficits  LABORATORY DATA:  CBC Latest Ref Rng 07/02/2015 05/07/2015 01/08/2015  WBC 3.9 - 10.3 10e3/uL 6.9 6.3 5.2  Hemoglobin 11.6 - 15.9 g/dL 13.8 - 10.8(L)  Hematocrit 34.8 - 46.6 % 41.4 42.6 34.4(L)  Platelets 145 - 400 10e3/uL 177 - 161   CMP Latest Ref Rng 07/02/2015 05/07/2015 01/08/2015  Glucose 70 - 140 mg/dl 115 94 102(H)  BUN 7.0 - 26.0 mg/dL 17._0 Creatinine 0.6 - 1.1 mg/dL 0.8 0.78 0.77  Sodium 136 - 145 mEq/L 140 142 139  Potassium 3.5 - 5.1 mEq/L 4.2 4.7 4.7  Chloride 97 - 108 mmol/L - 99 103  CO2 22 - 29 mEq/L _1 Calcium 8.4 - 10.4 mg/dL 9.5 9.5 8.8  Total Protein 6.4 - 8.3 g/dL 6.6 6.7 -  Albumin 3.5 - 4.8 g/dL - 4.5 -  Total Bilirubin 0.20 - 1.20 mg/dL 0.46 0.4 -  Alkaline Phos 40 - 150 U/L 85 85 -  AST 5 - 34 U/L 22 15 -  ALT 0 - 55 U/L 19 11 -     SURGICAL PATH 12/07/14 Bone Marrow, Aspirate,Biopsy, and Clot, right post iliac - HYPERCELLULAR BONE MARROW FOR AGE WITH TRILINEAGE HEMATOPOIESIS AND 5% PLASMA CELLS. - SEE COMMENT. PERIPHERAL BLOOD: - NORMOCYTIC-NORMOCHROMIC ANEMIA. Diagnosis Note The bone marrow is hypercellular for age with trilineage hematopoiesis and essentially orderly and progressive maturation of all myeloid cell types. The plasma cells represent 5% of all cells associated with occasional small clusters. Immunohistochemical stains apparently show polyclonal staining pattern in plasma cells for kappa and lambda light chains. The appearance is non specific and not diagnostic of plasma cell dyscrasia/neoplasm. Correlation  Cytogenetics: normal    RADIOGRAPHIC STUDIES: I have personally reviewed the radiological images as listed and agreed with the findings in the report. Ct Head Wo Contrast 10/27/2014    IMPRESSION: No acute intracranial abnormalities. Chronic atrophy and small vessel ischemic changes.     Ct Cervical Spine Wo Contrast 10/27/2014 IMPRESSION:  1. No evidence of acute fracture or malalignment.  2. Multilevel cervical spondylosis and facet arthropathy without focality.  3. Atherosclerotic vascular calcifications in both carotid arteries.      ASSESSMENT & PLAN:  Ms Brunton is a 74 year old female with multiple comorbidities, who presents with moderate persistent anemia after her spinal fusion surgery which required blood transfusion 3 times.  1. Aanemia of iron deficiency -I reviewed her the above lab result with her and  Her daughter. No lab evidence of megaloblastic anemia, or hemolysis.   -She has normal iron level and ferritin, but low iron saturation and elevated soluble transferrin receptor, and history of very low ferritin in the past (<10), which support IDA. Her bone marrow biopsy also showed low iron staining. -Anemia has spontaneously resolved. I suggest her to take by mouth iron 1-2 tablets a day, she is tolerating well, we'll continue -Follow-up her CBC.   2. MGUS, IgG  -SPEP showed low level M-protein, immunofixation was consistent with IgG lambda protein.  Quantitative immunoglobulin level and light chain levels were grossly normal. -I reviewed her bone marrow biopsy results, which showed 5% plasma cell, no monoclonal light chain deposition. -she likely has MGUS. No evidence of multiple myeloma. -Her SPEP results is still pending from today. I'll call her next week with the result.  3. Dementia, anxiety -She is on Aricept now. Follow up with neurologist.  4. HTN, AF, dyslipidemia  -follow up with PCP   Plan -I'll call her next week for her lab results. -Return  to clinic for follow-up in 6 months with lab  All questions were answered. The patient knows to call the clinic with any problems, questions or concerns.  I spent 20 minutes counseling the  patient face to face. The total time spent in the appointment was 30 minutes and more than 50% was on counseling.     Truitt Merle, MD 07/02/2015 2:46 PM

## 2015-07-02 NOTE — Telephone Encounter (Signed)
Gave patient avs report and appointments for January 2017. °

## 2015-07-04 LAB — SPEP & IFE WITH QIG
ABNORMAL PROTEIN BAND1: 0.3 g/dL
ALPHA-1-GLOBULIN: 0.3 g/dL (ref 0.2–0.3)
Albumin ELP: 3.7 g/dL — ABNORMAL LOW (ref 3.8–4.8)
Alpha-2-Globulin: 0.8 g/dL (ref 0.5–0.9)
BETA 2: 0.3 g/dL (ref 0.2–0.5)
Beta Globulin: 0.4 g/dL (ref 0.4–0.6)
Gamma Globulin: 0.9 g/dL (ref 0.8–1.7)
IGA: 157 mg/dL (ref 69–380)
IgG (Immunoglobin G), Serum: 927 mg/dL (ref 690–1700)
IgM, Serum: 101 mg/dL (ref 52–322)
Total Protein, Serum Electrophoresis: 6.3 g/dL (ref 6.1–8.1)

## 2015-07-11 ENCOUNTER — Encounter: Payer: Self-pay | Admitting: Gastroenterology

## 2015-07-13 LAB — UIFE/LIGHT CHAINS/TP QN, 24-HR UR
ALBUMIN, U: DETECTED
Alpha 1, Urine: DETECTED — AB
Alpha 2, Urine: DETECTED — AB
Beta, Urine: DETECTED — AB
GAMMA UR: DETECTED — AB
TIME-UPE24: 24 h
TOTAL PROTEIN, URINE-UPE24: 6 mg/dL (ref 5–24)
TOTAL PROTEIN, URINE-UR/DAY: 60 mg/d (ref <150)
Volume, Urine: 1000 mL

## 2015-07-13 LAB — 24 HR URINE,KAPPA/LAMBDA LIGHT CHAINS
Measured Kappa Chain: <0.4 mg/dL (ref <2.00)
URINE VOLUME: 1000 mL/(24.h)

## 2015-07-15 ENCOUNTER — Telehealth: Payer: Self-pay | Admitting: Hematology

## 2015-07-15 NOTE — Telephone Encounter (Signed)
I called pt and  [name] [name] , whom I met  With her in my clinic before.  I explained her SPEP and other lab test results,  And answer her questions.  Truitt Merle  07/15/2015

## 2015-07-16 ENCOUNTER — Encounter: Payer: Self-pay | Admitting: Cardiology

## 2015-07-16 ENCOUNTER — Ambulatory Visit (INDEPENDENT_AMBULATORY_CARE_PROVIDER_SITE_OTHER): Payer: Medicare Other | Admitting: Cardiology

## 2015-07-16 VITALS — BP 128/64 | HR 64 | Ht 67.0 in | Wt 195.0 lb

## 2015-07-16 DIAGNOSIS — I480 Paroxysmal atrial fibrillation: Secondary | ICD-10-CM

## 2015-07-16 DIAGNOSIS — E7800 Pure hypercholesterolemia, unspecified: Secondary | ICD-10-CM

## 2015-07-16 DIAGNOSIS — I5032 Chronic diastolic (congestive) heart failure: Secondary | ICD-10-CM

## 2015-07-16 DIAGNOSIS — I2510 Atherosclerotic heart disease of native coronary artery without angina pectoris: Secondary | ICD-10-CM | POA: Diagnosis not present

## 2015-07-16 MED ORDER — PANTOPRAZOLE SODIUM 40 MG PO TBEC: 40.0000 mg | DELAYED_RELEASE_TABLET | Freq: Two times a day (BID) | ORAL | Status: DC

## 2015-07-16 MED ORDER — FUROSEMIDE 40 MG PO TABS: ORAL_TABLET | ORAL | Status: DC

## 2015-07-16 NOTE — Patient Instructions (Signed)
Medication Instructions:  Increase lasix to take 1 tablet daily (40mg ) alternating with 1/2 tablet (20mg ) daily.  Increase protonix to 40mg  two times a day.  Labwork: BMET/BNP in 2 weeks--I have given you an order for this. Please fax the results to Dr Aundra Dubin [phone]  Testing/Procedures: None today  Follow-Up: Your physician recommends that you schedule a follow-up appointment in: 3-4 months with Dr Aundra Dubin.

## 2015-07-17 DIAGNOSIS — I5032 Chronic diastolic (congestive) heart failure: Secondary | ICD-10-CM | POA: Insufficient documentation

## 2015-07-17 NOTE — Progress Notes (Signed)
Patient ID: [name] [name], female   DOB: 10-Jul-1941, 74 y.o.   MRN: [id number] PCP: Dr. Laurance Flatten  74 yo with history of HTN, hyperlipidemia, and nonobstructive CAD presents for cardiology followup.  Prior to a back surgery, she had Lexiscan Cardiolite in 11/14 with no ischemia or infarction.  After her first operation, she remained in considerable pain.  She had repeat operation in 9/15 (spinal fusion T3-T12).  Post-op, she had atrial fibrillation.  She had DCCV to NSR but was not sent home on anticoagulation.  She remains in NSR. Her post-operative course was difficult.  She was orthostatic with multiple falls as well as anemia and confusion.  She was kept off anticoagulation because of this.  Confusion has improved.  She is still mildly lightheaded with standing but no falls over the last few weeks.    In 12/15, she was seen by Truitt Merle with chest pain and dyspnea.  Lexiscan Cardiolite was done in 12/15 showing normal EF but areas of ischemia concerning for multivessel disease. She was having episodes of chest soreness that occurred daily.  Not related to exertion.  She also had pain in her back related to recent surgery.    I took her for left heart cath in 1/16.  This showed severe distal left main/ostial LCx disease.  She was deemed a poor candidate for CABG due to the difficulty she would have with rehabilitation after the procedure.  She therefore went for bifurcation stenting of the distal LM/ostial LCx with rotational atherectomy.    She has gained some weight, up 6 lbs on our scale.  She is now noted more chest burning when lying down in bed at night.  She is not having any exertional chest pain.  She is not very active. She walks around the house without dyspnea, mild dyspnea if she walks longer distances.  Her ankle swelling has increased.   ECG: NSR, PAC  Labs (9/14) K 4.4, creatinine 0.81, LDL particle number 1279, LDL 69 Labs (5/15): K 4.5, creatinine 0.8, LDL-P 745, LDL 38 Labs  (12/15): K 4, creatinine 0.63 => 0.77, HCT 37.2, BNP 366, LDL 63, HDL 62 Labs (1/16): K 4.7, creatinine 0.77, HCT 34.4 Labs (5/16): LDL-P 821, LDL 52, HDL 61 Labs (7/16): K 4.2, creatinine 0.8, HCT 41.4  PMH: 1. Chronic low back pain: spinal stenosis.  Spinal fusion with rods in 2/15 at [facility].  Repeat spinal fusion 9/15.  2. Obesity 3. H/o diverticulitis 4. IBS 5. H/o colon polyps 6. GERD: h/o hiatal hernia 7. OSA 8. HTN 9. Hyperlipidemia 10. Depression 11. Cholecystectomy 12. H/o bilateral TKR 13. CAD: LHC (5/09) with 50% ostial LCx, 30-40% mRCA, 30% mLAD.  Lexiscan Cardiolite in 11/14 with EF 68%, no ischemia or infarction. Lexiscan Cardiolite (12/15) with EF 57%, moderate anteroapical and anteroseptal ischemia, inferolateral infarct with peri-infarct ischemia => suggestive of multivessel disease. LHC (1/16) with severe distal left main and ostial LCx stenosis confirmed by FFR.  Patient had bifurcation stenting of the distal LM/ostial LCx with rotational atherectomy.  14. PNA in 3/15 15. Atrial fibrillation: Paroxysmal.  Has only been noted post-op 9/15 spinal fusion.  She had cardioversion, not on anticoagulation.  16. MGUS: Negative bone marrow biopsy 12/15.  17. Anemia  SH: Widow, 2 children, retired Licensed conveyancer, nonsmoker.    FH: CAD in father, mother, brother.    ROS: All systems reviewed and negative except as per HPI.   Current Outpatient Prescriptions  Medication Sig Dispense Refill  . amLODipine (NORVASC)  2.5 MG tablet Take 1 tablet by mouth  daily. For HTN 90 tablet 3  . aspirin EC 81 MG tablet Take 1 tablet (81 mg total) by mouth daily.    . Calcium Carbonate-Vit D-Min (CALCIUM 1200) 1200-1000 MG-UNIT CHEW Chew 1 each by mouth daily.    . clopidogrel (PLAVIX) 75 MG tablet Take 1 tablet (75 mg total) by mouth daily. 90 tablet 3  . colesevelam (WELCHOL) 625 MG tablet Take 625 mg by mouth daily with breakfast.    . diphenoxylate-atropine (LOMOTIL) 2.5-0.025 MG per  tablet Take 1 tablet by mouth 4 (four) times daily as needed for diarrhea or loose stools.     . donepezil (ARICEPT) 5 MG tablet Take 1 tablet (5 mg total) by mouth at bedtime. (Patient taking differently: Take 5 mg by mouth at bedtime. CANT TAKE DUE TO WEAKNESS) 30 tablet 1  . Eszopiclone 3 MG TABS Take 1 tablet (3 mg total) by mouth at bedtime. For insomnia,take immediately before bedtime. 90 tablet 1  . fentaNYL (DURAGESIC - DOSED MCG/HR) 50 MCG/HR Place 1 patch (50 mcg total) onto the skin every 3 (three) days. 10 patch 0  . FLUoxetine (PROZAC) 40 MG capsule Take 1 capsule (40 mg total) by mouth daily. For depression 90 capsule 3  . HYDROcodone-acetaminophen (NORCO) 10-325 MG per tablet Take 1 tablet by mouth every 4 (four) hours as needed for moderate pain.     . iron polysaccharides (NIFEREX) 150 MG capsule Take 1 capsule (150 mg total) by mouth daily. 90 capsule 3  . isosorbide mononitrate (IMDUR) 30 MG 24 hr tablet Take 1 tablet (30 mg total) by mouth daily. 90 tablet 3  . LORazepam (ATIVAN) 0.5 MG tablet Take 1 tablet (0.5 mg total) by mouth 2 (two) times daily as needed for anxiety. 60 tablet 1  . meclizine (ANTIVERT) 25 MG tablet Take 1 tablet (25 mg total) by mouth 3 (three) times daily as needed for dizziness. 60 tablet 0  . methocarbamol (ROBAXIN) 500 MG tablet Take 1 tablet by mouth 3 (three) times daily as needed. PRN  0  . Multiple Vitamin (MULTIVITAMIN) tablet Take 1 tablet by mouth daily. For supplement    . nitroGLYCERIN (NITROSTAT) 0.4 MG SL tablet Place 1 tablet (0.4 mg total) under the tongue every 5 (five) minutes as needed for chest pain (up to 3 doses). 25 tablet 3  . rosuvastatin (CRESTOR) 20 MG tablet Take 0.5-1 tablets (10-20 mg total) by mouth every other day. 20 mg one day alternate 10 mg the next day and repeats    . furosemide (LASIX) 40 MG tablet 1 tablet (53m)  by mouth daily alternating with 1/2 tablet (2105m  by mouth daily 30 tablet 4  . pantoprazole (PROTONIX)  40 MG tablet Take 1 tablet (40 mg total) by mouth 2 (two) times daily. 60 tablet 4   No current facility-administered medications for this visit.   BP 128/64 mmHg  Pulse 64  Ht _0  (1.702 m)  Wt 195 lb (88.451 kg)  BMI 30.53 kg/m2 General: NAD Neck: No JVD, no thyromegaly or thyroid nodule.  Lungs: Clear to auscultation bilaterally with normal respiratory effort. CV: Nondisplaced PMI.  Heart regular S1/S2, no S3/S4, no murmur.  Trace bilateral ankle edema.  No carotid bruit.  Normal pedal pulses.  Abdomen: Soft, nontender, no hepatosplenomegaly, no distention.  Skin: Intact without lesions or rashes.  Neurologic: Alert and oriented x 3.  Psych: Normal affect.  Assessment/Plan: 1. CAD:  She is  now s/p bifurcation stenting of the distal LM/ostial LCx with rotational atherectomy in 1/16 as she was deemed a poor candidate for CABG.  She does seem to be having less chest pain and dyspnea now.  She has been having some chest burning while lying in bed at night that I suspect is due to GERD.    - Continue ASA 81, Plavix, Imdur, and statin.  - I will have her increase Protonix to 40 mg bid as I think that the nocturnal chest pain is related to GERD.  2. Hyperlipidemia: Good lipids on Crestor.   3. HTN: BP controlled on current medications.    4. Atrial fibrillation: Paroxysmal.  Has only occurred post-op spinal fusion.  She was not anticoagulated given frequent falls.  I will leave her off for now but if she has recurrent atrial fibrillation, would start anticoagulation.   5. Chronic diastolic CHF: She has been on Lasix 20 mg daily long-term.  She feels like she has been retaining fluid.  Weight is up and she has some lower extremity edema but overall does not appear markedly volume overloaded.  - She will increase Lasix to 40 daily alternating with 20 daily.  BMET/BNP in 2 wks.  6. GI: She needs screening colonoscopy.  I told her that she ought to wait 1 year post-stent prior to holding Plavix  as long as c-scope is not urgent.   Followup in 3 months.    Loralie Champagne 07/17/2015

## 2015-07-22 ENCOUNTER — Telehealth: Payer: Self-pay

## 2015-07-22 NOTE — Telephone Encounter (Signed)
Insurance denied teir exception for [employer]

## 2015-07-31 ENCOUNTER — Other Ambulatory Visit: Payer: Self-pay | Admitting: *Deleted

## 2015-07-31 DIAGNOSIS — I2510 Atherosclerotic heart disease of native coronary artery without angina pectoris: Secondary | ICD-10-CM

## 2015-07-31 DIAGNOSIS — I480 Paroxysmal atrial fibrillation: Secondary | ICD-10-CM

## 2015-07-31 MED ORDER — PANTOPRAZOLE SODIUM 40 MG PO TBEC: 40.0000 mg | DELAYED_RELEASE_TABLET | Freq: Two times a day (BID) | ORAL | Status: DC

## 2015-08-15 ENCOUNTER — Other Ambulatory Visit (INDEPENDENT_AMBULATORY_CARE_PROVIDER_SITE_OTHER): Payer: Medicare Other

## 2015-08-15 DIAGNOSIS — C130 Malignant neoplasm of postcricoid region: Secondary | ICD-10-CM | POA: Diagnosis not present

## 2015-08-15 NOTE — Progress Notes (Signed)
Lab only 

## 2015-08-16 LAB — BMP8+EGFR
BUN / CREAT RATIO: 15 (ref 11–26)
BUN: 12 mg/dL (ref 8–27)
CO2: 28 mmol/L (ref 18–29)
Calcium: 9.3 mg/dL (ref 8.7–10.3)
Chloride: 96 mmol/L — ABNORMAL LOW (ref 97–108)
Creatinine, Ser: 0.81 mg/dL (ref 0.57–1.00)
GFR calc Af Amer: 82 mL/min/{1.73_m2} (ref >59)
GFR calc non Af Amer: 71 mL/min/{1.73_m2} (ref >59)
GLUCOSE: 99 mg/dL (ref 65–99)
Potassium: 4 mmol/L (ref 3.5–5.2)
SODIUM: 141 mmol/L (ref 134–144)

## 2015-08-16 LAB — BRAIN NATRIURETIC PEPTIDE: BNP: 43.7 pg/mL (ref 0.0–100.0)

## 2015-08-21 ENCOUNTER — Telehealth: Payer: Self-pay | Admitting: Cardiology

## 2015-08-21 NOTE — Telephone Encounter (Signed)
New Message       Pt's daughter calling to get lab results. Please call back and advise.

## 2015-08-21 NOTE — Telephone Encounter (Signed)
Spoke with pt's daughter, [name], about lab results done 08/15/15

## 2015-08-22 HISTORY — PX: POSTERIOR FUSION THORACIC SPINE: SUR1045

## 2015-09-02 ENCOUNTER — Telehealth: Payer: Self-pay | Admitting: *Deleted

## 2015-09-02 NOTE — Telephone Encounter (Signed)
[name] [name] at Dr. Cathey Endow office aware that it will have to come from cardiologist

## 2015-09-02 NOTE — Telephone Encounter (Signed)
-----   Message from [name], MD sent at 09/02/2015 10:14 AM EDT ----- Regarding: FW: Medical Clearance for Dental work Contact: [phone] Please have Dr. Berdine Addison to get in touch with patient's cardiologist. ----- Message -----    From: Jonathon Jordan    Sent: 09/02/2015   9:02 AM      To: Zannie Cove, LPN, [name], MD Subject: Medical Clearance for Dental work              Dr. Zenovia Jordan at Dr. Cathey Endow office called to see if you have received the letter regarding [name] [name].  They need to have medical clearance on her to perform some dental work.  Could you please let them know as soon as you can.  There numbers are [phone] or [phone].  Thank you

## 2015-09-09 ENCOUNTER — Ambulatory Visit: Payer: Medicare Other | Admitting: Family Medicine

## 2015-09-16 ENCOUNTER — Telehealth: Payer: Self-pay | Admitting: Cardiology

## 2015-09-16 NOTE — Telephone Encounter (Signed)
Would consider doing it at the oral surgeon's office.

## 2015-09-16 NOTE — Telephone Encounter (Signed)
Form was faxed to oral surgeon att: to Manuela Schwartz.

## 2015-09-16 NOTE — Telephone Encounter (Signed)
She does not need antibiotics.  Would not hold Plavix if possible until after 1/16.

## 2015-09-16 NOTE — Telephone Encounter (Signed)
Will forward to Dr McLean for review. 

## 2015-09-16 NOTE — Telephone Encounter (Signed)
New message   Office calling    1. What dental office are you calling from? susan calling from Dr.Mark Hill   2. What is your office phone and fax number? [phone] / fax fax [phone]   3. What type of procedure is the patient having performed? Teeth extraction   4. What date is procedure scheduled? Pending   5. What is your question (ex. Antibiotics prior to procedure, holding medication-we need to know how long dentist wants pt to hold med)? Is she stable enough to have at the office or oral surgeion office.

## 2015-09-16 NOTE — Telephone Encounter (Signed)
Dentist office wanted to know if you felt pt was stable enough to have extraction at the Dentist office or should she have it at an Oral Surgeon Office?

## 2015-09-27 ENCOUNTER — Ambulatory Visit: Payer: Medicare Other | Admitting: Family Medicine

## 2015-10-05 ENCOUNTER — Other Ambulatory Visit: Payer: Self-pay | Admitting: Family Medicine

## 2015-10-11 ENCOUNTER — Ambulatory Visit (INDEPENDENT_AMBULATORY_CARE_PROVIDER_SITE_OTHER): Payer: Medicare Other | Admitting: Family Medicine

## 2015-10-11 ENCOUNTER — Encounter: Payer: Self-pay | Admitting: Family Medicine

## 2015-10-11 VITALS — BP 108/68 | HR 71 | Temp 98.6°F | Ht 67.0 in | Wt 196.0 lb

## 2015-10-11 DIAGNOSIS — I10 Essential (primary) hypertension: Secondary | ICD-10-CM | POA: Diagnosis not present

## 2015-10-11 DIAGNOSIS — Z23 Encounter for immunization: Secondary | ICD-10-CM

## 2015-10-11 DIAGNOSIS — I5032 Chronic diastolic (congestive) heart failure: Secondary | ICD-10-CM | POA: Diagnosis not present

## 2015-10-11 DIAGNOSIS — I480 Paroxysmal atrial fibrillation: Secondary | ICD-10-CM | POA: Diagnosis not present

## 2015-10-11 DIAGNOSIS — E559 Vitamin D deficiency, unspecified: Secondary | ICD-10-CM

## 2015-10-11 DIAGNOSIS — E785 Hyperlipidemia, unspecified: Secondary | ICD-10-CM | POA: Diagnosis not present

## 2015-10-11 DIAGNOSIS — Z981 Arthrodesis status: Secondary | ICD-10-CM

## 2015-10-11 DIAGNOSIS — I209 Angina pectoris, unspecified: Secondary | ICD-10-CM | POA: Diagnosis not present

## 2015-10-11 DIAGNOSIS — K219 Gastro-esophageal reflux disease without esophagitis: Secondary | ICD-10-CM | POA: Diagnosis not present

## 2015-10-11 DIAGNOSIS — D509 Iron deficiency anemia, unspecified: Secondary | ICD-10-CM

## 2015-10-11 DIAGNOSIS — M5136 Other intervertebral disc degeneration, lumbar region: Secondary | ICD-10-CM

## 2015-10-11 NOTE — Progress Notes (Signed)
Subjective:    Patient ID: [name] [name], female    DOB: 15-Jan-1941, 74 y.o.   MRN: [id number]  HPI  Pt here for follow up and management of chronic medical problems which includes hypertension and hyperlipidemia. She is taking medications regularly. The biggest complaint today is some dizziness. The patient is due to return an FOBT and get lab work and also to get her flu shot. The patient looks the best that she is "in a good while. She denies chest pain  trouble swallowing heartburn indigestion nausea vomiting or trouble passing her water. She does have occasional shortness of breath. She has a chronic problem with loose bowel movements and has had this worked up by the gastroenterologist and she says that she was told that she just has poor muscle control in her rectum. She is to see the cardiologist soon.     Patient Active Problem List   Diagnosis Date Noted  . Chronic diastolic CHF (congestive heart failure) ([place]) 07/17/2015  . Anemia, iron deficiency 07/02/2015  . DDD (degenerative disc disease), lumbar 03/22/2015  . Breathlessness on exertion 03/22/2015  . Can't get food down 03/22/2015  . Fall 03/22/2015  . Acid reflux 03/22/2015  . Cardiac murmur 03/22/2015  . 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 Difficulty hearing 03/22/2015  . Adaptive colitis 03/22/2015  . Lichen 30/06/6225  . Lumbar scoliosis 03/22/2015  . Fracture of pelvis ([place]) 03/22/2015  . PONV (postoperative nausea and vomiting) 03/22/2015  . Herpes zona 03/22/2015  . History of other specified conditions presenting hazards to health 03/22/2015  . CAD s/p rot atherectomy, PTCA/stenting of ostial Cx and LM into LAD (crush technique), IVUS of LAD/LM 12/2014 01/08/2015  . Anemia 01/08/2015  . MGUS (monoclonal gammopathy of unknown significance) 01/08/2015  . Ischemic chest pain ([place])   . Paroxysmal atrial fibrillation ([place]) 12/22/2014  . CAD (coronary artery disease) 12/22/2014  . Memory disorder 12/04/2014  .  Deficiency anemia 11/10/2014  . Vertigo 10/27/2014  . Angulation of spine 09/18/2014  . Metabolic syndrome 33/35/4562  . HCAP (healthcare-associated pneumonia) 03/27/2014  . S/P spinal fusion 02/23/2014  . Acute blood loss anemia 02/23/2014  . Chronic pain associated with significant psychosocial dysfunction 02/23/2014  . Preoperative evaluation of a medical condition to rule out surgical contraindications (TAR required) 10/09/2013  . Abdominal pain 07/06/2013  . Gastroesophageal hernia 07/06/2013  . Incontinence 10/13/2012  . History of IBS 01/26/2012  . 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 EDEMA 06/25/2010  . DEGENERATIVE JOINT DISEASE 04/08/2010  . OTHER DISORDERS OF NERVOUS SYSTEM&SENSE ORGANS 04/08/2010  . HYPERCHOLESTEROLEMIA  IIA 04/08/2009  . Obesity (BMI 30.0-34.9) 04/04/2009  . CIRCADIAN RHYTHM SLEEP D/O DELAY SLEEP PHSE TYPE 04/04/2009  . CARPAL TUNNEL SYNDROME 04/04/2009  . Coronary atherosclerosis 04/04/2009  . DIASTOLIC HEART FAILURE, CHRONIC 04/04/2009  . Diarrhea 01/17/2009  . IRRITABLE BOWEL SYNDROME 11/13/2008  . ESOPHAGEAL STRICTURE 11/12/2008  . GERD 11/12/2008  . DUODENITIS, WITHOUT HEMORRHAGE 11/12/2008  . Diaphragmatic hernia without mention of obstruction or gangrene 11/12/2008  . DIVERTICULOSIS, COLON 11/12/2008  . COLONIC POLYPS, HX OF 11/12/2008  . Obstructive sleep apnea 05/23/2008  . DYSPNEA 05/23/2008  . Hyperlipidemia 05/22/2008  . Depression 05/22/2008  . Essential hypertension 05/22/2008  . INSOMNIA 05/22/2008   Outpatient Encounter Prescriptions as of 10/11/2015  Medication Sig  . amLODipine (NORVASC) 2.5 MG tablet Take 1 tablet by mouth  daily for hypertension  . aspirin EC 81 MG tablet Take 1 tablet (81 mg total) by mouth daily.  Marland Kitchen  Calcium Carbonate-Vit D-Min (CALCIUM 1200) 1200-1000 MG-UNIT CHEW Chew 1 each by mouth daily.  . clopidogrel (PLAVIX) 75 MG tablet Take 1 tablet (75 mg total) by mouth daily.  . colesevelam (WELCHOL) 625  MG tablet Take 625 mg by mouth daily with breakfast.  . diphenoxylate-atropine (LOMOTIL) 2.5-0.025 MG per tablet Take 1 tablet by mouth 4 (four) times daily as needed for diarrhea or loose stools.   . donepezil (ARICEPT) 5 MG tablet Take 1 tablet (5 mg total) by mouth at bedtime. (Patient taking differently: Take 5 mg by mouth at bedtime. CANT TAKE DUE TO WEAKNESS)  . Eszopiclone 3 MG TABS Take 1 tablet (3 mg total) by mouth at bedtime. For insomnia,take immediately before bedtime.  . fentaNYL (DURAGESIC - DOSED MCG/HR) 50 MCG/HR Place 1 patch (50 mcg total) onto the skin every 3 (three) days.  Marland Kitchen FLUoxetine (PROZAC) 40 MG capsule Take 1 capsule by mouth  daily for depression  . furosemide (LASIX) 40 MG tablet 1 tablet (7m)  by mouth daily alternating with 1/2 tablet (228m  by mouth daily  . HYDROcodone-acetaminophen (NORCO) 10-325 MG per tablet Take 1 tablet by mouth every 4 (four) hours as needed for moderate pain.   . iron polysaccharides (NIFEREX) 150 MG capsule Take 1 capsule (150 mg total) by mouth daily.  . isosorbide mononitrate (IMDUR) 30 MG 24 hr tablet Take 1 tablet (30 mg total) by mouth daily.  . Marland KitchenORazepam (ATIVAN) 0.5 MG tablet Take 1 tablet (0.5 mg total) by mouth 2 (two) times daily as needed for anxiety.  . meclizine (ANTIVERT) 25 MG tablet Take 1 tablet (25 mg total) by mouth 3 (three) times daily as needed for dizziness.  . methocarbamol (ROBAXIN) 500 MG tablet Take 1 tablet by mouth 3 (three) times daily as needed. PRN  . Multiple Vitamin (MULTIVITAMIN) tablet Take 1 tablet by mouth daily. For supplement  . nitroGLYCERIN (NITROSTAT) 0.4 MG SL tablet Place 1 tablet (0.4 mg total) under the tongue every 5 (five) minutes as needed for chest pain (up to 3 doses).  . pantoprazole (PROTONIX) 40 MG tablet Take 1 tablet (40 mg total) by mouth 2 (two) times daily.  . rosuvastatin (CRESTOR) 20 MG tablet Take 0.5-1 tablets (10-20 mg total) by mouth every other day. 20 mg one day alternate  10 mg the next day and repeats  . [DISCONTINUED] furosemide (LASIX) 20 MG tablet Take 1 tablet by mouth  daily  . [DISCONTINUED] rosuvastatin (CRESTOR) 20 MG tablet Take 1 tablet by mouth  daily   No facility-administered encounter medications on file as of 10/11/2015.      Review of Systems  Constitutional: Negative.   HENT: Negative.   Eyes: Negative.   Respiratory: Negative.   Cardiovascular: Negative.   Gastrointestinal: Negative.   Endocrine: Negative.   Genitourinary: Negative.   Musculoskeletal: Negative.   Skin: Negative.   Allergic/Immunologic: Negative.   Neurological: Positive for dizziness.  Hematological: Negative.   Psychiatric/Behavioral: Negative.        Objective:   Physical Exam  Constitutional: She is oriented to person, place, and time. She appears well-developed and well-nourished. No distress.  The patient seems more relaxed than usual. She was alert and pleasant and is using a cane for ambulation. She drove to the office by herself in her car.  HENT:  Head: Normocephalic and atraumatic.  Right Ear: External ear normal.  Left Ear: External ear normal.  Nose: Nose normal.  Mouth/Throat: Oropharynx is clear and moist.  Eyes: Conjunctivae  and EOM are normal. Pupils are equal, round, and reactive to light. Right eye exhibits no discharge. Left eye exhibits no discharge. No scleral icterus.  Neck: Normal range of motion. Neck supple. No thyromegaly present.  Cardiovascular: Normal rate, regular rhythm, normal heart sounds and intact distal pulses.   No murmur heard. At 72/m  Pulmonary/Chest: Effort normal and breath sounds normal. No respiratory distress. She has no wheezes. She has no rales. She exhibits no tenderness.  Clear anteriorly and posteriorly  Abdominal: Soft. Bowel sounds are normal. She exhibits no mass. There is no tenderness. There is no rebound and no guarding.  No abdominal tenderness elicited with the exam. No bruits.  Musculoskeletal:  She exhibits no edema or tenderness.  The patient uses a cane for ambulation she has had both thoracic spine and lumbar spine surgeries. She has trouble raising both legs with some pain and stiffness in her back.  Lymphadenopathy:    She has no cervical adenopathy.  Neurological: She is alert and oriented to person, place, and time. She has normal reflexes. No cranial nerve deficit.  Skin: Skin is warm and dry. No rash noted.  Psychiatric: She has a normal mood and affect. Her behavior is normal. Judgment and thought content normal.  Nursing note and vitals reviewed.   BP 108/68 mmHg  Pulse 71  Temp(Src) 98.6 F (37 C) (Oral)  Ht 5' 7"  (1.702 m)  Wt 196 lb (88.905 kg)  BMI 30.69 kg/m2       Assessment & Plan:  1. Vitamin D deficiency -Continue current treatment pending results of lab work - CBC with Differential/Platelet - Vit D  25 hydroxy (rtn osteoporosis monitoring)  2. Gastroesophageal reflux disease, esophagitis presence not specified -The patient is doing well with this today and she will continue with her pantoprazole - CBC with Differential/Platelet - Hepatic function panel  3. Hyperlipemia -The patient is currently taking Crestor and seems to have tolerated this well and she will continue taking this pending results of lab work - CBC with Differential/Platelet - Hepatic function panel - NMR, lipoprofile  4. Essential hypertension -Blood pressure is slightly decreased today and he'll be no change in treatment and we will continue to monitor the dizziness and make sure that is not associated with the blood pressure. She will check some home blood pressure readings and take those with her when she goes to visit the cardiologist - BMP8+EGFR - CBC with Differential/Platelet - Hepatic function panel  5. Anemia, iron deficiency - CBC with Differential/Platelet  6. S/P spinal fusion -Overall she is doing well with her spinal fusion which is been less than a year and  she will continue to work at exercising and strengthening her body with walking and stationary bicycle  7. Paroxysmal atrial fibrillation ([facility]) -The heart was actually regular today at 72/m  8. Ischemic chest pain ([place]) -She is not having any chest pain she will follow-up with the cardiologist.  9. Hyperlipidemia -Continue current treatment pending results of lab work  Patient Instructions                       Medicare Annual Wellness Visit  [facility] and the medical providers at [facility] strive to bring you the best medical care.  In doing so we not only want to address your current medical conditions and concerns but also to detect new conditions early and prevent illness, disease and health-related problems.    Medicare offers a  yearly Wellness Visit which allows our clinical staff to assess your need for preventative services including immunizations, lifestyle education, counseling to decrease risk of preventable diseases and screening for fall risk and other medical concerns.    This visit is provided free of charge (no copay) for all Medicare recipients. The clinical pharmacists at [facility] have begun to conduct these Wellness Visits which will also include a thorough review of all your medications.    As you primary medical provider recommend that you make an appointment for your Annual Wellness Visit if you have not done so already this year.  You may set up this appointment before you leave today or you may call back ([phone]) and schedule an appointment.  Please make sure when you call that you mention that you are scheduling your Annual Wellness Visit with the clinical pharmacist so that the appointment may be made for the proper length of time.     Continue current medications. Continue good therapeutic lifestyle changes which include good diet and exercise. Fall precautions discussed with patient. If an FOBT was given  today- please return it to our front desk. If you are over 90 years old - you may need Prevnar 63 or the adult Pneumonia vaccine.  **Flu shots are available--- please call and schedule a FLU-CLINIC appointment**  After your visit with [place] today you will receive a survey in the mail or online from [employer] regarding your care with [place]. Please take a moment to fill this out. Your feedback is very important to [place] as you can help [place] better understand your patient needs as well as improve your experience and satisfaction. WE CARE ABOUT YOU!!!   Continue to follow-up with cardiology  return the FOBT The flu shot that you received today may make your arm sore Monitor blood pressures at home if possible and take these readings with you to see the cardiologist   Arrie Senate MD   10. DDD (degenerative disc disease), lumbar -She is recovering slowly from this but is doing better with her back pain and she was prior to the surgery.  11. Chronic diastolic CHF (congestive heart failure) ([place]) -Continue follow-up with cardiology and continue current medications with no change  Arrie Senate MD

## 2015-10-11 NOTE — Patient Instructions (Addendum)
Medicare Annual Wellness Visit  [facility] and the medical providers at [facility] strive to bring you the best medical care.  In doing so we not only want to address your current medical conditions and concerns but also to detect new conditions early and prevent illness, disease and health-related problems.    Medicare offers a yearly Wellness Visit which allows our clinical staff to assess your need for preventative services including immunizations, lifestyle education, counseling to decrease risk of preventable diseases and screening for fall risk and other medical concerns.    This visit is provided free of charge (no copay) for all Medicare recipients. The clinical pharmacists at [facility] have begun to conduct these Wellness Visits which will also include a thorough review of all your medications.    As you primary medical provider recommend that you make an appointment for your Annual Wellness Visit if you have not done so already this year.  You may set up this appointment before you leave today or you may call back ([phone]) and schedule an appointment.  Please make sure when you call that you mention that you are scheduling your Annual Wellness Visit with the clinical pharmacist so that the appointment may be made for the proper length of time.     Continue current medications. Continue good therapeutic lifestyle changes which include good diet and exercise. Fall precautions discussed with patient. If an FOBT was given today- please return it to our front desk. If you are over 60 years old - you may need Prevnar 82 or the adult Pneumonia vaccine.  **Flu shots are available--- please call and schedule a FLU-CLINIC appointment**  After your visit with [place] today you will receive a survey in the mail or online from [employer] regarding your care with [place]. Please take a moment to fill this out. Your feedback is very  important to [place] as you can help [place] better understand your patient needs as well as improve your experience and satisfaction. WE CARE ABOUT YOU!!!   Continue to follow-up with cardiology  return the FOBT The flu shot that you received today may make your arm sore Monitor blood pressures at home if possible and take these readings with you to see the cardiologist

## 2015-10-12 LAB — HEPATIC FUNCTION PANEL
ALBUMIN: 4.3 g/dL (ref 3.5–4.8)
ALT: 17 IU/L (ref 0–32)
AST: 27 IU/L (ref 0–40)
Alkaline Phosphatase: 85 IU/L (ref 39–117)
Bilirubin Total: 0.6 mg/dL (ref 0.0–1.2)
Bilirubin, Direct: 0.18 mg/dL (ref 0.00–0.40)
TOTAL PROTEIN: 6.6 g/dL (ref 6.0–8.5)

## 2015-10-12 LAB — CBC WITH DIFFERENTIAL/PLATELET
BASOS ABS: 0 10*3/uL (ref 0.0–0.2)
Basos: 1 %
EOS (ABSOLUTE): 0.2 10*3/uL (ref 0.0–0.4)
EOS: 3 %
HEMATOCRIT: 40.6 % (ref 34.0–46.6)
Hemoglobin: 13.7 g/dL (ref 11.1–15.9)
IMMATURE GRANULOCYTES: 0 %
Immature Grans (Abs): 0 10*3/uL (ref 0.0–0.1)
Lymphocytes Absolute: 1.9 10*3/uL (ref 0.7–3.1)
Lymphs: 30 %
MCH: 30.9 pg (ref 26.6–33.0)
MCHC: 33.7 g/dL (ref 31.5–35.7)
MCV: 92 fL (ref 79–97)
MONOS ABS: 0.5 10*3/uL (ref 0.1–0.9)
Monocytes: 8 %
NEUTROS PCT: 58 %
Neutrophils Absolute: 3.7 10*3/uL (ref 1.4–7.0)
PLATELETS: 226 10*3/uL (ref 150–379)
RBC: 4.43 x10E6/uL (ref 3.77–5.28)
RDW: 13.7 % (ref 12.3–15.4)
WBC: 6.4 10*3/uL (ref 3.4–10.8)

## 2015-10-12 LAB — BMP8+EGFR
BUN/Creatinine Ratio: 19 (ref 11–26)
BUN: 15 mg/dL (ref 8–27)
CHLORIDE: 97 mmol/L (ref 97–106)
CO2: 24 mmol/L (ref 18–29)
CREATININE: 0.77 mg/dL (ref 0.57–1.00)
Calcium: 9.3 mg/dL (ref 8.7–10.3)
GFR calc Af Amer: 87 mL/min/{1.73_m2} (ref >59)
GFR calc non Af Amer: 75 mL/min/{1.73_m2} (ref >59)
GLUCOSE: 104 mg/dL — AB (ref 65–99)
Potassium: 4.3 mmol/L (ref 3.5–5.2)
SODIUM: 141 mmol/L (ref 136–144)

## 2015-10-12 LAB — NMR, LIPOPROFILE
CHOLESTEROL: 151 mg/dL (ref 100–199)
HDL CHOLESTEROL BY NMR: 56 mg/dL (ref >39)
HDL PARTICLE NUMBER: 37.5 umol/L (ref >=30.5)
LDL Particle Number: 925 nmol/L (ref <1000)
LDL Size: 20.1 nm (ref >20.5)
LDL-C: 57 mg/dL (ref 0–99)
LP-IR Score: 56 — ABNORMAL HIGH (ref <=45)
SMALL LDL PARTICLE NUMBER: 559 nmol/L — AB (ref <=527)
TRIGLYCERIDES BY NMR: 189 mg/dL — AB (ref 0–149)

## 2015-10-12 LAB — VITAMIN D 25 HYDROXY (VIT D DEFICIENCY, FRACTURES): Vit D, 25-Hydroxy: 36.8 ng/mL (ref 30.0–100.0)

## 2015-10-21 ENCOUNTER — Other Ambulatory Visit: Payer: Self-pay | Admitting: *Deleted

## 2015-10-21 MED ORDER — ESZOPICLONE 3 MG PO TABS: 3.0000 mg | ORAL_TABLET | Freq: Every day | ORAL | Status: DC

## 2015-10-21 NOTE — Telephone Encounter (Signed)
Last filled 09/22/15, last seen 10/11/15. Print for mail order

## 2015-10-21 NOTE — Telephone Encounter (Signed)
Patient aware that rx is ready to be picked up.  

## 2015-10-24 ENCOUNTER — Ambulatory Visit (INDEPENDENT_AMBULATORY_CARE_PROVIDER_SITE_OTHER): Payer: Medicare Other | Admitting: Cardiology

## 2015-10-24 ENCOUNTER — Encounter: Payer: Self-pay | Admitting: Cardiology

## 2015-10-24 VITALS — BP 118/56 | HR 64 | Ht 67.0 in | Wt 197.8 lb

## 2015-10-24 DIAGNOSIS — I2510 Atherosclerotic heart disease of native coronary artery without angina pectoris: Secondary | ICD-10-CM | POA: Diagnosis not present

## 2015-10-24 DIAGNOSIS — I480 Paroxysmal atrial fibrillation: Secondary | ICD-10-CM | POA: Diagnosis not present

## 2015-10-24 DIAGNOSIS — I10 Essential (primary) hypertension: Secondary | ICD-10-CM | POA: Diagnosis not present

## 2015-10-24 DIAGNOSIS — Z9861 Coronary angioplasty status: Secondary | ICD-10-CM

## 2015-10-24 NOTE — Patient Instructions (Signed)
Medication Instructions:  No changes today  Labwork: None today  Testing/Procedures: None today  Follow-Up: Your physician wants you to follow-up in: 6 months with Dr McLean. (May 2017).  You will receive a reminder letter in the mail two months in advance. If you don't receive a letter, please call our office to schedule the follow-up appointment.        If you need a refill on your cardiac medications before your next appointment, please call your pharmacy.   

## 2015-10-25 ENCOUNTER — Telehealth: Payer: Self-pay | Admitting: Family Medicine

## 2015-10-25 DIAGNOSIS — R42 Dizziness and giddiness: Secondary | ICD-10-CM

## 2015-10-25 NOTE — Telephone Encounter (Signed)
Please do a referral to Dr. Erik Obey as soon as possible

## 2015-10-25 NOTE — Telephone Encounter (Signed)
Patients daughter wants a referral to Dr. Erik Obey for dizziness. Please advise

## 2015-10-26 NOTE — Progress Notes (Signed)
Patient ID: [name] [name], female   DOB: April 10, 1941, 74 y.o.   MRN: [id number] PCP: Dr. Laurance Flatten  74 yo with history of HTN, hyperlipidemia, and nonobstructive CAD presents for cardiology followup.  Prior to a back surgery, she had Lexiscan Cardiolite in 11/14 with no ischemia or infarction.  After her first operation, she remained in considerable pain.  She had repeat operation in 9/15 (spinal fusion T3-T12).  Post-op, she had atrial fibrillation.  She had DCCV to NSR but was not sent home on anticoagulation.  She remains in NSR. Her post-operative course was difficult.  She was orthostatic with multiple falls as well as anemia and confusion.  She was kept off anticoagulation because of this.  Confusion has improved.  She is still mildly lightheaded with standing but no falls over the last few weeks.    In 12/15, she was seen by Truitt Merle with chest pain and dyspnea.  Lexiscan Cardiolite was done in 12/15 showing normal EF but areas of ischemia concerning for multivessel disease. She was having episodes of chest soreness that occurred daily.  Not related to exertion.  She also had pain in her back related to recent surgery.    I took her for left heart cath in 1/16.  This showed severe distal left main/ostial LCx disease.  She was deemed a poor candidate for CABG due to the difficulty she would have with rehabilitation after the procedure.  She therefore went for bifurcation stenting of the distal LM/ostial LCx with rotational atherectomy.    Currently, she is symptomatically stable.  She is short of breath walking 50-100 feet.  No chest pain.  She had a lot of difficulty with diarrhea recently and stool cultures grew E coli.  This resolved with antibiotics.  For the last 3 days, she has had fairly constant vertigo.  She has tried meclizine but this has not helped much.  Weight stable.   ECG: NSR, PACs  Labs (9/14) K 4.4, creatinine 0.81, LDL particle number 1279, LDL 69 Labs (5/15): K 4.5, creatinine  0.8, LDL-P 745, LDL 38 Labs (12/15): K 4, creatinine 0.63 => 0.77, HCT 37.2, BNP 366, LDL 63, HDL 62 Labs (1/16): K 4.7, creatinine 0.77, HCT 34.4 Labs (5/16): LDL-P 821, LDL 52, HDL 61 Labs (7/16): K 4.2, creatinine 0.8, HCT 41.4 Labs (10/16): K 4.3, creatinine 0.77, LDL-P 925, LDL 57  PMH: 1. Chronic low back pain: spinal stenosis.  Spinal fusion with rods in 2/15 at [facility].  Repeat spinal fusion 9/15.  2. Obesity 3. H/o diverticulitis 4. IBS 5. H/o colon polyps 6. GERD: h/o hiatal hernia 7. OSA 8. HTN 9. Hyperlipidemia 10. Depression 11. Cholecystectomy 12. H/o bilateral TKR 13. CAD: LHC (5/09) with 50% ostial LCx, 30-40% mRCA, 30% mLAD.  Lexiscan Cardiolite in 11/14 with EF 68%, no ischemia or infarction. Lexiscan Cardiolite (12/15) with EF 57%, moderate anteroapical and anteroseptal ischemia, inferolateral infarct with peri-infarct ischemia => suggestive of multivessel disease. LHC (1/16) with severe distal left main and ostial LCx stenosis confirmed by FFR.  Patient had bifurcation stenting of the distal LM/ostial LCx with rotational atherectomy.  14. PNA in 3/15 15. Atrial fibrillation: Paroxysmal.  Has only been noted post-op 9/15 spinal fusion.  She had cardioversion, not on anticoagulation.  16. MGUS: Negative bone marrow biopsy 12/15.  17. Anemia  SH: Widow, 2 children, retired Licensed conveyancer, nonsmoker.    FH: CAD in father, mother, brother.    ROS: All systems reviewed and negative except as per HPI.  Current Outpatient Prescriptions  Medication Sig Dispense Refill  . amLODipine (NORVASC) 2.5 MG tablet Take 1 tablet by mouth  daily for hypertension 90 tablet 0  . aspirin EC 81 MG tablet Take 1 tablet (81 mg total) by mouth daily.    . Calcium Carbonate-Vit D-Min (CALCIUM 1200) 1200-1000 MG-UNIT CHEW Chew 1 each by mouth daily.    . clopidogrel (PLAVIX) 75 MG tablet Take 1 tablet (75 mg total) by mouth daily. 90 tablet 3  . colesevelam (WELCHOL) 625 MG tablet Take  625 mg by mouth 2 (two) times daily with a meal.     . diphenoxylate-atropine (LOMOTIL) 2.5-0.025 MG per tablet Take 1 tablet by mouth 4 (four) times daily as needed for diarrhea or loose stools.     . Eszopiclone 3 MG TABS Take 1 tablet (3 mg total) by mouth at bedtime. For insomnia,take immediately before bedtime. 90 tablet 1  . fentaNYL (DURAGESIC - DOSED MCG/HR) 50 MCG/HR Place 1 patch (50 mcg total) onto the skin every 3 (three) days. 10 patch 0  . FLUoxetine (PROZAC) 40 MG capsule Take 1 capsule by mouth  daily for depression 90 capsule 0  . furosemide (LASIX) 20 MG tablet Take 20 mg by mouth.    . furosemide (LASIX) 40 MG tablet 1 tablet (52m)  by mouth daily alternating with 1/2 tablet (279m  by mouth daily 30 tablet 4  . HYDROcodone-acetaminophen (NORCO) 10-325 MG per tablet Take 1 tablet by mouth every 4 (four) hours as needed for moderate pain.     . Marland KitchenYDROcodone-acetaminophen (NORCO) 10-325 MG tablet Take 1 tablet by mouth.    . iron polysaccharides (NIFEREX) 150 MG capsule Take 1 capsule (150 mg total) by mouth daily. 90 capsule 3  . isosorbide mononitrate (IMDUR) 30 MG 24 hr tablet Take 1 tablet (30 mg total) by mouth daily. 90 tablet 3  . LORazepam (ATIVAN) 0.5 MG tablet Take 1 tablet (0.5 mg total) by mouth 2 (two) times daily as needed for anxiety. 60 tablet 1  . meclizine (ANTIVERT) 25 MG tablet Take 1 tablet (25 mg total) by mouth 3 (three) times daily as needed for dizziness. 60 tablet 0  . methocarbamol (ROBAXIN) 500 MG tablet Take 1 tablet by mouth 3 (three) times daily as needed. PRN  0  . Multiple Vitamin (MULTIVITAMIN) tablet Take 1 tablet by mouth daily. For supplement    . nitroGLYCERIN (NITROSTAT) 0.4 MG SL tablet Place 1 tablet (0.4 mg total) under the tongue every 5 (five) minutes as needed for chest pain (up to 3 doses). 25 tablet 3  . pantoprazole (PROTONIX) 40 MG tablet Take 1 tablet (40 mg total) by mouth 2 (two) times daily. 180 tablet 1  . rosuvastatin (CRESTOR)  20 MG tablet Take 0.5-1 tablets (10-20 mg total) by mouth every other day. 20 mg one day alternate 10 mg the next day and repeats     No current facility-administered medications for this visit.   BP 118/56 mmHg  Pulse 64  Ht 5' 7" (1.702 m)  Wt 197 lb 12.8 oz (89.721 kg)  BMI 30.97 kg/m2 General: NAD Neck: No JVD, no thyromegaly or thyroid nodule.  Lungs: Clear to auscultation bilaterally with normal respiratory effort. CV: Nondisplaced PMI.  Heart regular S1/S2, no S3/S4, no murmur.  No edema.  No carotid bruit.  Normal pedal pulses.  Abdomen: Soft, nontender, no hepatosplenomegaly, no distention.  Skin: Intact without lesions or rashes.  Neurologic: Alert and oriented x 3.  Psych: Normal  affect.  Assessment/Plan: 1. CAD:  She is now s/p bifurcation stenting of the distal LM/ostial LCx with rotational atherectomy in 1/16 as she was deemed a poor candidate for CABG.  No further chest pain. - Continue ASA 81, Plavix, Imdur, and statin.  2. Hyperlipidemia: Good lipids on Crestor.   3. HTN: BP controlled on current medications.    4. Atrial fibrillation: Paroxysmal.  Has only occurred post-op spinal fusion.  She was not anticoagulated given frequent falls.  I will leave her off for now but if she has recurrent atrial fibrillation, would start anticoagulation.   5. Chronic diastolic CHF: Dyspnea stable and not volume overloaded on exam.  Continue Lasix 40 mg daily alternating with 20 mg daily.   6. GI: She needs screening colonoscopy.  I told her that she ought to wait 1 year post-stent prior to holding Plavix as long as c-scope is not urgent.   Followup in 6 months.    Loralie Champagne 10/26/2015

## 2015-10-29 NOTE — Telephone Encounter (Signed)
Referral placed.

## 2015-11-25 ENCOUNTER — Other Ambulatory Visit: Payer: Self-pay

## 2015-11-25 ENCOUNTER — Other Ambulatory Visit (INDEPENDENT_AMBULATORY_CARE_PROVIDER_SITE_OTHER): Payer: Medicare Other

## 2015-11-25 DIAGNOSIS — I2510 Atherosclerotic heart disease of native coronary artery without angina pectoris: Secondary | ICD-10-CM

## 2015-11-25 DIAGNOSIS — H9110 Presbycusis, unspecified ear: Secondary | ICD-10-CM | POA: Diagnosis not present

## 2015-11-25 DIAGNOSIS — M316 Other giant cell arteritis: Secondary | ICD-10-CM

## 2015-11-25 DIAGNOSIS — I480 Paroxysmal atrial fibrillation: Secondary | ICD-10-CM

## 2015-11-25 MED ORDER — FUROSEMIDE 40 MG PO TABS: ORAL_TABLET | ORAL | Status: DC

## 2015-11-25 MED ORDER — FUROSEMIDE 20 MG PO TABS: 20.0000 mg | ORAL_TABLET | Freq: Every day | ORAL | Status: DC

## 2015-11-25 MED ORDER — PANTOPRAZOLE SODIUM 40 MG PO TBEC: 40.0000 mg | DELAYED_RELEASE_TABLET | Freq: Two times a day (BID) | ORAL | Status: DC

## 2015-11-25 NOTE — Progress Notes (Signed)
Lab only 

## 2015-11-26 ENCOUNTER — Other Ambulatory Visit: Payer: Self-pay | Admitting: Otolaryngology

## 2015-11-26 DIAGNOSIS — H9110 Presbycusis, unspecified ear: Secondary | ICD-10-CM

## 2015-11-26 DIAGNOSIS — H8110 Benign paroxysmal vertigo, unspecified ear: Secondary | ICD-10-CM

## 2015-11-26 LAB — CBC WITH DIFFERENTIAL/PLATELET
BASOS ABS: 0 10*3/uL (ref 0.0–0.2)
BASOS: 0 %
EOS (ABSOLUTE): 0.2 10*3/uL (ref 0.0–0.4)
EOS: 3 %
HEMATOCRIT: 39 % (ref 34.0–46.6)
HEMOGLOBIN: 13.3 g/dL (ref 11.1–15.9)
IMMATURE GRANS (ABS): 0 10*3/uL (ref 0.0–0.1)
Immature Granulocytes: 0 %
LYMPHS: 30 %
Lymphocytes Absolute: 1.8 10*3/uL (ref 0.7–3.1)
MCH: 31.3 pg (ref 26.6–33.0)
MCHC: 34.1 g/dL (ref 31.5–35.7)
MCV: 92 fL (ref 79–97)
MONOCYTES: 8 %
Monocytes Absolute: 0.5 10*3/uL (ref 0.1–0.9)
NEUTROS ABS: 3.6 10*3/uL (ref 1.4–7.0)
Neutrophils: 59 %
Platelets: 194 10*3/uL (ref 150–379)
RBC: 4.25 x10E6/uL (ref 3.77–5.28)
RDW: 13.9 % (ref 12.3–15.4)
WBC: 6.2 10*3/uL (ref 3.4–10.8)

## 2015-11-26 LAB — SEDIMENTATION RATE: SED RATE: 8 mm/h (ref 0–40)

## 2015-11-26 NOTE — Addendum Note (Signed)
Addended by: Selmer Dominion on: 11/26/2015 04:18 PM   Modules accepted: Orders

## 2015-11-27 LAB — HIGH SENSITIVITY CRP: CRP, High Sensitivity: 0.83 mg/L (ref 0.00–3.00)

## 2015-11-27 LAB — C-REACTIVE PROTEIN: CRP: 0.9 mg/L (ref 0.0–4.9)

## 2015-11-28 LAB — HM MAMMOGRAPHY

## 2015-12-03 ENCOUNTER — Emergency Department (HOSPITAL_COMMUNITY)
Admission: EM | Admit: 2015-12-03 | Discharge: 2015-12-03 | Disposition: A | Discharge status: Home / Self Care | Payer: Medicare Other | Attending: Emergency Medicine | Admitting: Emergency Medicine

## 2015-12-03 ENCOUNTER — Encounter (HOSPITAL_COMMUNITY): Payer: Self-pay

## 2015-12-03 ENCOUNTER — Emergency Department (HOSPITAL_COMMUNITY): Payer: Medicare Other

## 2015-12-03 DIAGNOSIS — E669 Obesity, unspecified: Secondary | ICD-10-CM | POA: Insufficient documentation

## 2015-12-03 DIAGNOSIS — Y92008 Other place in unspecified non-institutional (private) residence as the place of occurrence of the external cause: Secondary | ICD-10-CM | POA: Insufficient documentation

## 2015-12-03 DIAGNOSIS — I480 Paroxysmal atrial fibrillation: Secondary | ICD-10-CM | POA: Insufficient documentation

## 2015-12-03 DIAGNOSIS — G4700 Insomnia, unspecified: Secondary | ICD-10-CM | POA: Diagnosis not present

## 2015-12-03 DIAGNOSIS — Z7982 Long term (current) use of aspirin: Secondary | ICD-10-CM | POA: Insufficient documentation

## 2015-12-03 DIAGNOSIS — I2510 Atherosclerotic heart disease of native coronary artery without angina pectoris: Secondary | ICD-10-CM | POA: Insufficient documentation

## 2015-12-03 DIAGNOSIS — Y998 Other external cause status: Secondary | ICD-10-CM | POA: Diagnosis not present

## 2015-12-03 DIAGNOSIS — D649 Anemia, unspecified: Secondary | ICD-10-CM | POA: Diagnosis not present

## 2015-12-03 DIAGNOSIS — I5032 Chronic diastolic (congestive) heart failure: Secondary | ICD-10-CM | POA: Insufficient documentation

## 2015-12-03 DIAGNOSIS — F419 Anxiety disorder, unspecified: Secondary | ICD-10-CM | POA: Insufficient documentation

## 2015-12-03 DIAGNOSIS — Z79899 Other long term (current) drug therapy: Secondary | ICD-10-CM | POA: Diagnosis not present

## 2015-12-03 DIAGNOSIS — Z9889 Other specified postprocedural states: Secondary | ICD-10-CM | POA: Diagnosis not present

## 2015-12-03 DIAGNOSIS — W19XXXA Unspecified fall, initial encounter: Secondary | ICD-10-CM

## 2015-12-03 DIAGNOSIS — Y9301 Activity, walking, marching and hiking: Secondary | ICD-10-CM | POA: Insufficient documentation

## 2015-12-03 DIAGNOSIS — K219 Gastro-esophageal reflux disease without esophagitis: Secondary | ICD-10-CM | POA: Diagnosis not present

## 2015-12-03 DIAGNOSIS — W01198A Fall on same level from slipping, tripping and stumbling with subsequent striking against other object, initial encounter: Secondary | ICD-10-CM | POA: Diagnosis not present

## 2015-12-03 DIAGNOSIS — Z7902 Long term (current) use of antithrombotics/antiplatelets: Secondary | ICD-10-CM | POA: Insufficient documentation

## 2015-12-03 DIAGNOSIS — I10 Essential (primary) hypertension: Secondary | ICD-10-CM | POA: Insufficient documentation

## 2015-12-03 DIAGNOSIS — Z86010 Personal history of colonic polyps: Secondary | ICD-10-CM | POA: Insufficient documentation

## 2015-12-03 DIAGNOSIS — M158 Other polyosteoarthritis: Secondary | ICD-10-CM | POA: Diagnosis not present

## 2015-12-03 DIAGNOSIS — Z8673 Personal history of transient ischemic attack (TIA), and cerebral infarction without residual deficits: Secondary | ICD-10-CM | POA: Insufficient documentation

## 2015-12-03 DIAGNOSIS — S0993XA Unspecified injury of face, initial encounter: Secondary | ICD-10-CM | POA: Diagnosis present

## 2015-12-03 DIAGNOSIS — F329 Major depressive disorder, single episode, unspecified: Secondary | ICD-10-CM | POA: Insufficient documentation

## 2015-12-03 DIAGNOSIS — Z9861 Coronary angioplasty status: Secondary | ICD-10-CM | POA: Insufficient documentation

## 2015-12-03 DIAGNOSIS — Z8701 Personal history of pneumonia (recurrent): Secondary | ICD-10-CM | POA: Insufficient documentation

## 2015-12-03 DIAGNOSIS — S0083XA Contusion of other part of head, initial encounter: Secondary | ICD-10-CM | POA: Diagnosis not present

## 2015-12-03 DIAGNOSIS — G8929 Other chronic pain: Secondary | ICD-10-CM | POA: Insufficient documentation

## 2015-12-03 DIAGNOSIS — M542 Cervicalgia: Secondary | ICD-10-CM

## 2015-12-03 DIAGNOSIS — E785 Hyperlipidemia, unspecified: Secondary | ICD-10-CM | POA: Insufficient documentation

## 2015-12-03 DIAGNOSIS — S199XXA Unspecified injury of neck, initial encounter: Secondary | ICD-10-CM | POA: Diagnosis not present

## 2015-12-03 MED ORDER — IBUPROFEN 800 MG PO TABS: 800.0000 mg | ORAL_TABLET | Freq: Three times a day (TID) | ORAL | Status: DC

## 2015-12-03 MED ORDER — ONDANSETRON HCL 4 MG/2ML IJ SOLN
4.0000 mg | Freq: Once | INTRAMUSCULAR | Status: AC
  Administered 2015-12-03: 4 mg via INTRAVENOUS
  Filled 2015-12-03: qty 2

## 2015-12-03 MED ORDER — METHOCARBAMOL 500 MG PO TABS: 500.0000 mg | ORAL_TABLET | Freq: Two times a day (BID) | ORAL | Status: DC

## 2015-12-03 MED ORDER — METHOCARBAMOL 500 MG PO TABS
750.0000 mg | ORAL_TABLET | Freq: Once | ORAL | Status: AC
  Administered 2015-12-03: 750 mg via ORAL
  Filled 2015-12-03: qty 2

## 2015-12-03 MED ORDER — FENTANYL CITRATE (PF) 100 MCG/2ML IJ SOLN
50.0000 ug | Freq: Once | INTRAMUSCULAR | Status: AC
  Administered 2015-12-03: 50 ug via INTRAVENOUS
  Filled 2015-12-03: qty 2

## 2015-12-03 NOTE — Discharge Instructions (Signed)
You were seen in the emergency room today for evaluation following a fall. Your CT scans were normal. I will give you a prescription for Robaxin, a muscle relaxant, and ibuprofen to take for pain/swelling. You may also take the Norco that you have at home as needed for pain. You may use ice on your face to help minimize bruising and swelling. Please follow up with your primary care provider within one week. Return to the ER for new or worsening symptoms.   Please obtain all of your results from medical records or have your doctors office obtain the results - share them with your doctor - you should be seen at your doctors office in the next 2 days. Call today to arrange your follow up. Take the medications as prescribed. Please review all of the medicines and only take them if you do not have an allergy to them. Please be aware that if you are taking birth control pills, taking other prescriptions, ESPECIALLY ANTIBIOTICS may make the birth control ineffective - if this is the [place], either do not engage in sexual activity or use alternative methods of birth control such as condoms until you have finished the medicine and your family doctor says it is OK to restart them. If you are on a blood thinner such as COUMADIN, be aware that any other medicine that you take may cause the coumadin to either work too much, or not enough - you should have your coumadin level rechecked in next 7 days if this is the [place].  ?  It is also a possibility that you have an allergic reaction to any of the medicines that you have been prescribed - Everybody reacts differently to medications and while MOST people have no trouble with most medicines, you may have a reaction such as nausea, vomiting, rash, swelling, shortness of breath. If this is the [place], please stop taking the medicine immediately and contact your physician.  ?  You should return to the ER if you develop severe or worsening symptoms.

## 2015-12-03 NOTE — ED Provider Notes (Signed)
CSN: [id number]     Arrival date & time 12/03/15  1736 History   First MD Initiated Contact with Patient 12/03/15 1745     Chief Complaint  Patient presents with  . Fall    HPI   [name] is an 74 y.o. female who presents to the ED for evaluation after a fall. She states she was walking off her porch, tripped, and fell off her porch. She states she hit the ground face first and was unable to brace her fall with her hands. She states at baseline she walks with a cane but has been having more trouble getting around over the past few months. Pt states she did not lose consciousness. She reports neck pain and soreness as well as pain along the entire left side of her back. She states she is nervous because she had spinal fusion surgery last year. Denies any numbness or weakness. Denies headache, visual disturbance. She is on Plavix.   Past Medical History  Diagnosis Date  . Obesity   . Circadian rhythm sleep disorder   . CTS (carpal tunnel syndrome)   . Diarrhea   . IBS (irritable bowel syndrome)   . Diverticulitis of colon   . Gastritis   . Esophageal stricture   . Personal history of colonic polyps 10/25/2011 & 12/02/11    not retrieved Dr Lyla Son & tubular adenomas  . Hiatal hernia   . GERD (gastroesophageal reflux disease)   . Insomnia   . HTN (hypertension)   . Hyperlipidemia   . Depression   . Chronic diastolic CHF (congestive heart failure) ([place])   . Memory disorder 12/04/2014  . Pneumonia 03/2014  . Obstructive sleep apnea     "have mask; don't wear it" (01/07/2015)  . Anemia   . History of blood transfusion     "most of them related to OR's"   . MGUS (monoclonal gammopathy of unknown significance) dx'd 11/2014    a. Neg BMB 11/2014.  . Stroke [facility] early 2000's    "small"; denies residual on 01/07/2015)  . Arthritis     "knees, back, fingers, toes; joints" (01/07/2015)  . Chronic back pain greater than 3 months duration     a. spinal stenosis. Spinal fusion with rods  in 2/15 at [facility] spinal fusion 9/15.  Marland Kitchen Anxiety   . CAD (coronary artery disease)     a. Abnl nuc 11/2014. Cath 12/2014 - turned down for CABG. Ultimately s/p TTVP, rotational atherectomy, PTCA and stenting of the ostial LCx and left main into the LAD (crush technique), and IVUS of the LAD/Left main.  Marland Kitchen PAF (paroxysmal atrial fibrillation) ([place])     a.  Paroxysmal. Has only been noted post-op 9/15 spinal fusion. She had cardioversion, not on anticoagulation. Fall risk, unsteady.  . Sinus bradycardia     a. Baseline HR 50s-60s.  Marland Kitchen PAT (paroxysmal atrial tachycardia) ([place])   . Multiple falls   . Orthostasis   . PONV (postoperative nausea and vomiting) 03/22/2015   Past Surgical History  Procedure Laterality Date  . Carpal tunnel release Right 1980's  . Total knee arthroplasty Bilateral 1990's - 2000's  . Cataract extraction Bilateral   . Left heart catheterization with coronary angiogram N/A 12/27/2014    Procedure: LEFT HEART CATHETERIZATION WITH CORONARY ANGIOGRAM;  Surgeon: Larey Dresser, MD;  Location: [facility] CATH LAB;  Service: Cardiovascular;  Laterality: N/A;  . Percutaneous coronary rotoblator intervention (pci-r)  01/07/2015  . Cardiac catheterization  12/27/2014  Procedure: INTRAVASCULAR PRESSURE WIRE/FFR STUDY;  Surgeon: Larey Dresser, MD;  Location[facility] CATH LAB;  Service: Cardiovascular;;  . Coronary angioplasty with stent placement  01/07/2015    "2"  . Laparoscopic cholecystectomy  2003  . Joint replacement    . Knee arthroscopy Left 1995  . Back surgery    . Posterior lumbar fusion  01/2015  . Posterior fusion thoracic spine  08/2015  . Dilation and curettage of uterus    . Cataract extraction w/ intraocular lens  implant, bilateral  2000's  . Esophagogastroduodenoscopy (egd) with esophageal dilation  "several times"  . Bone marrow biopsy  11/2014  . Percutaneous coronary rotoblator intervention (pci-r) N/A 01/07/2015    Procedure: PERCUTANEOUS CORONARY  ROTOBLATOR INTERVENTION (PCI-R);  Surgeon: Blane Ohara, MD;  Location[facility] CATH LAB;  Service: Cardiovascular;  Laterality: N/A;  . Coronary stent placement     Family History  Problem Relation Age of Onset  . Coronary artery disease Father   . Peripheral vascular disease Father   . Coronary artery disease Mother   . Coronary artery disease Brother   . Colon cancer Sister   . Emphysema Sister   . Sleep apnea Son   . Colon cancer Sister     spread to her brain  . Dementia Neg Hx   . Emphysema Brother    Social History  Substance Use Topics  . Smoking status: Never Smoker   . Smokeless tobacco: Never Used  . Alcohol Use: No     Comment: 01/07/2015 "glass of wine at Christmas, maybe"   OB History    No data available     Review of Systems  All other systems reviewed and are negative.     Allergies  Cymbalta; Morphine and related; Oxycodone-acetaminophen; Valium; Altace; Floxin; Lipitor; Lovaza; Pravachol; Trilipix; Zetia; and Zocor  Home Medications   Prior to Admission medications   Medication Sig Start Date End Date Taking? Authorizing Provider  amLODipine (NORVASC) 2.5 MG tablet Take 1 tablet by mouth  daily for hypertension 10/07/15   [name], MD  aspirin EC 81 MG tablet Take 1 tablet (81 mg total) by mouth daily. 01/08/15   Dayna N Dunn, PA-C  Calcium Carbonate-Vit D-Min (CALCIUM 1200) 1200-1000 MG-UNIT CHEW Chew 1 each by mouth daily.    Historical Provider, MD  clopidogrel (PLAVIX) 75 MG tablet Take 1 tablet (75 mg total) by mouth daily. 03/22/15   Larey Dresser, MD  colesevelam [facility]) 625 MG tablet Take 625 mg by mouth 2 (two) times daily with a meal.     Historical Provider, MD  diphenoxylate-atropine (LOMOTIL) 2.5-0.025 MG per tablet Take 1 tablet by mouth 4 (four) times daily as needed for diarrhea or loose stools.  08/17/14   Historical Provider, MD  Eszopiclone 3 MG TABS Take 1 tablet (3 mg total) by mouth at bedtime. For insomnia,take immediately  before bedtime. 10/21/15   [name], MD  fentaNYL (DURAGESIC - DOSED MCG/HR) 50 MCG/HR Place 1 patch (50 mcg total) onto the skin every 3 (three) days. 10/31/14   [name], MD  FLUoxetine (PROZAC) 40 MG capsule Take 1 capsule by mouth  daily for depression 10/07/15   [name], MD  furosemide (LASIX) 20 MG tablet Take 1 tablet (20 mg total) by mouth daily. 11/25/15   [name], MD  furosemide (LASIX) 40 MG tablet 1 tablet (92m)  by mouth daily alternating with 1/2 tablet (222m  by mouth daily 11/25/15  [name], MD  HYDROcodone-acetaminophen [facility]) 10-325 MG per tablet Take 1 tablet by mouth every 4 (four) hours as needed for moderate pain.     Historical Provider, MD  HYDROcodone-acetaminophen (NORCO) 10-325 MG tablet Take 1 tablet by mouth.    Historical Provider, MD  iron polysaccharides (NIFEREX) 150 MG capsule Take 1 capsule (150 mg total) by mouth daily. 11/13/14   [name], MD  isosorbide mononitrate (IMDUR) 30 MG 24 hr tablet Take 1 tablet (30 mg total) by mouth daily. 03/22/15   Larey Dresser, MD  LORazepam (ATIVAN) 0.5 MG tablet Take 1 tablet (0.5 mg total) by mouth 2 (two) times daily as needed for anxiety. 10/31/14   [name], MD  meclizine (ANTIVERT) 25 MG tablet Take 1 tablet (25 mg total) by mouth 3 (three) times daily as needed for dizziness. 03/27/15   [name], MD  methocarbamol (ROBAXIN) 500 MG tablet Take 1 tablet by mouth 3 (three) times daily as needed. PRN 03/27/15   Historical Provider, MD  Multiple Vitamin (MULTIVITAMIN) tablet Take 1 tablet by mouth daily. For supplement    Historical Provider, MD  nitroGLYCERIN (NITROSTAT) 0.4 MG SL tablet Place 1 tablet (0.4 mg total) under the tongue every 5 (five) minutes as needed for chest pain (up to 3 doses). 01/08/15   Dayna N Dunn, PA-C  pantoprazole (PROTONIX) 40 MG tablet Take 1 tablet (40 mg total) by mouth 2 (two) times daily. 11/25/15   [name], MD  rosuvastatin (CRESTOR) 20 MG  tablet Take 0.5-1 tablets (10-20 mg total) by mouth every other day. 20 mg one day alternate 10 mg the next day and repeats 01/08/15   Dayna N Dunn, PA-C   SpO2 98% Physical Exam  Constitutional: She is oriented to person, place, and time. No distress. Cervical collar in place.  HENT:  Head:    Right Ear: External ear normal.  Left Ear: External ear normal.  Nose: Nose normal.  Mouth/Throat: Oropharynx is clear and moist. No oropharyngeal exudate.  No crepitus  Eyes: Conjunctivae and EOM are normal. Pupils are equal, round, and reactive to light.  Neck: No tracheal deviation present.  +C-spine tenderness. Diffuse paraspinal tenderness.  Cardiovascular: Normal rate, regular rhythm, normal heart sounds and intact distal pulses.   Pulmonary/Chest: Effort normal and breath sounds normal. No respiratory distress. She has no wheezes. She exhibits no tenderness.  Abdominal: Soft. Bowel sounds are normal. She exhibits no distension. There is no tenderness.  Musculoskeletal: Normal range of motion. She exhibits no edema.  Diffuse left paraspinal ttp and spasm.   Neurological: She is alert and oriented to person, place, and time. She has normal strength. No cranial nerve deficit or sensory deficit.  Skin: Skin is warm and dry. She is not diaphoretic.  Psychiatric: She has a normal mood and affect.  Nursing note and vitals reviewed.   ED Course  Procedures (including critical care time) Labs Review Labs Reviewed - No data to display  Imaging Review Ct Head Wo Contrast  12/03/2015  CLINICAL DATA:  Fall.  Pain in neck. EXAM: CT HEAD WITHOUT CONTRAST CT CERVICAL SPINE WITHOUT CONTRAST TECHNIQUE: Multidetector CT imaging of the head and cervical spine was performed following the standard protocol without intravenous contrast. Multiplanar CT image reconstructions of the cervical spine were also generated. COMPARISON:  12/22/2014 FINDINGS: CT HEAD FINDINGS Sinuses/Soft tissues: No significant soft  tissue swelling. No skull fracture. Intracranial: Mild for age low density in the periventricular white  matter likely related to small vessel disease. No mass lesion, hemorrhage, hydrocephalus, acute infarct, intra-axial, or extra-axial fluid collection. CT CERVICAL SPINE FINDINGS Spinal visualization through the bottom of T2. Incompletely imaged thoracic spine fixation. Prevertebral soft tissues are within normal limits. No apical pneumothorax. Multilevel cervical spondylosis, with resultant and areas of central canal and primarily left-sided neural foraminal narrowing. Skull base intact. Facets are well-aligned. Coronal reformats demonstrate a normal C1-C2 articulation. IMPRESSION: 1.  No acute intracranial abnormality. 2. Advanced cervical spondylosis, without acute fracture or subluxation. Electronically Signed   By: Abigail Miyamoto M.D.   On: 12/03/2015 19:22   Ct Cervical Spine Wo Contrast  12/03/2015  CLINICAL DATA:  Fall.  Pain in neck. EXAM: CT HEAD WITHOUT CONTRAST CT CERVICAL SPINE WITHOUT CONTRAST TECHNIQUE: Multidetector CT imaging of the head and cervical spine was performed following the standard protocol without intravenous contrast. Multiplanar CT image reconstructions of the cervical spine were also generated. COMPARISON:  12/22/2014 FINDINGS: CT HEAD FINDINGS Sinuses/Soft tissues: No significant soft tissue swelling. No skull fracture. Intracranial: Mild for age low density in the periventricular white matter likely related to small vessel disease. No mass lesion, hemorrhage, hydrocephalus, acute infarct, intra-axial, or extra-axial fluid collection. CT CERVICAL SPINE FINDINGS Spinal visualization through the bottom of T2. Incompletely imaged thoracic spine fixation. Prevertebral soft tissues are within normal limits. No apical pneumothorax. Multilevel cervical spondylosis, with resultant and areas of central canal and primarily left-sided neural foraminal narrowing. Skull base intact. Facets are  well-aligned. Coronal reformats demonstrate a normal C1-C2 articulation. IMPRESSION: 1.  No acute intracranial abnormality. 2. Advanced cervical spondylosis, without acute fracture or subluxation. Electronically Signed   By: Abigail Miyamoto M.D.   On: 12/03/2015 19:22   I have personally reviewed and evaluated these images and lab results as part of my medical decision-making.   EKG Interpretation None      MDM   Final diagnoses:  Fall, initial encounter  Contusion of face, initial encounter  Neck pain   Pt is an 74 y.o. female presenting after mechanical fall off her porch. Given age and c-spine tenderness, CT head and c-spine were obtained. Scans were negative. Pt reported some improvement in pain with IV fentanyl. Given her muscular spasm and tenderness, will give Robaxin and will give rx for home. Pt states she has some robaxin at home and takes from time to time for her chronic pain. She is unsure how many she has left so will give short course rx of robaxin and ibuprofen. Pt has norco at home. ER return precautions given.    Anne Ng, PA-C 12/04/15 1437  Charlesetta Shanks, MD 12/05/15 [phone]

## 2015-12-03 NOTE — ED Notes (Addendum)
Pt. Presents following fall off porch today approx 8 inch. Pt. With hx of L orbital fx 1 year ago and 2 spinal surgeries this year. Pt. Complaint of left sided pain.

## 2015-12-03 NOTE — ED Notes (Signed)
Patient transported to CT 

## 2015-12-07 ENCOUNTER — Other Ambulatory Visit: Payer: Medicare Other

## 2015-12-10 ENCOUNTER — Encounter: Payer: Self-pay | Admitting: *Deleted

## 2015-12-18 ENCOUNTER — Ambulatory Visit
Admission: RE | Admit: 2015-12-18 | Discharge: 2015-12-18 | Disposition: A | Discharge status: Home / Self Care | Payer: Medicare Other | Source: Ambulatory Visit | Attending: Otolaryngology | Admitting: Otolaryngology

## 2015-12-18 DIAGNOSIS — H9110 Presbycusis, unspecified ear: Secondary | ICD-10-CM

## 2015-12-18 DIAGNOSIS — H8110 Benign paroxysmal vertigo, unspecified ear: Secondary | ICD-10-CM

## 2015-12-18 MED ORDER — GADOBENATE DIMEGLUMINE 529 MG/ML IV SOLN
20.0000 mL | Freq: Once | INTRAVENOUS | Status: AC | PRN
  Administered 2015-12-18: 17 mL via INTRAVENOUS

## 2015-12-24 ENCOUNTER — Ambulatory Visit: Payer: Medicare Other | Admitting: Neurology

## 2015-12-31 DIAGNOSIS — Z9889 Other specified postprocedural states: Secondary | ICD-10-CM | POA: Diagnosis not present

## 2015-12-31 DIAGNOSIS — Z09 Encounter for follow-up examination after completed treatment for conditions other than malignant neoplasm: Secondary | ICD-10-CM | POA: Diagnosis not present

## 2015-12-31 DIAGNOSIS — I2510 Atherosclerotic heart disease of native coronary artery without angina pectoris: Secondary | ICD-10-CM | POA: Diagnosis not present

## 2015-12-31 DIAGNOSIS — K449 Diaphragmatic hernia without obstruction or gangrene: Secondary | ICD-10-CM | POA: Diagnosis not present

## 2015-12-31 DIAGNOSIS — Z888 Allergy status to other drugs, medicaments and biological substances status: Secondary | ICD-10-CM | POA: Diagnosis not present

## 2015-12-31 DIAGNOSIS — I110 Hypertensive heart disease with heart failure: Secondary | ICD-10-CM | POA: Diagnosis not present

## 2015-12-31 DIAGNOSIS — E119 Type 2 diabetes mellitus without complications: Secondary | ICD-10-CM | POA: Diagnosis not present

## 2015-12-31 DIAGNOSIS — I5032 Chronic diastolic (congestive) heart failure: Secondary | ICD-10-CM | POA: Diagnosis not present

## 2015-12-31 DIAGNOSIS — E785 Hyperlipidemia, unspecified: Secondary | ICD-10-CM | POA: Diagnosis not present

## 2015-12-31 DIAGNOSIS — Z8673 Personal history of transient ischemic attack (TIA), and cerebral infarction without residual deficits: Secondary | ICD-10-CM | POA: Diagnosis not present

## 2015-12-31 DIAGNOSIS — Z9181 History of falling: Secondary | ICD-10-CM | POA: Diagnosis not present

## 2016-01-02 ENCOUNTER — Ambulatory Visit (HOSPITAL_BASED_OUTPATIENT_CLINIC_OR_DEPARTMENT_OTHER): Payer: Medicare HMO | Admitting: Hematology

## 2016-01-02 ENCOUNTER — Ambulatory Visit (HOSPITAL_BASED_OUTPATIENT_CLINIC_OR_DEPARTMENT_OTHER): Payer: Medicare HMO

## 2016-01-02 ENCOUNTER — Encounter: Payer: Self-pay | Admitting: Hematology

## 2016-01-02 ENCOUNTER — Telehealth: Payer: Self-pay | Admitting: Hematology

## 2016-01-02 VITALS — BP 116/87 | HR 80 | Temp 99.1°F | Resp 18 | Ht 67.0 in | Wt 199.6 lb

## 2016-01-02 DIAGNOSIS — D509 Iron deficiency anemia, unspecified: Secondary | ICD-10-CM

## 2016-01-02 DIAGNOSIS — I10 Essential (primary) hypertension: Secondary | ICD-10-CM | POA: Diagnosis not present

## 2016-01-02 DIAGNOSIS — D472 Monoclonal gammopathy: Secondary | ICD-10-CM

## 2016-01-02 DIAGNOSIS — I4891 Unspecified atrial fibrillation: Secondary | ICD-10-CM | POA: Diagnosis not present

## 2016-01-02 LAB — COMPREHENSIVE METABOLIC PANEL
ALK PHOS: 81 U/L (ref 40–150)
ALT: 15 U/L (ref 0–55)
ANION GAP: 9 meq/L (ref 3–11)
AST: 19 U/L (ref 5–34)
Albumin: 3.6 g/dL (ref 3.5–5.0)
BUN: 11.9 mg/dL (ref 7.0–26.0)
CO2: 27 meq/L (ref 22–29)
Calcium: 9.1 mg/dL (ref 8.4–10.4)
Chloride: 106 mEq/L (ref 98–109)
Creatinine: 0.9 mg/dL (ref 0.6–1.1)
EGFR: 65 mL/min/{1.73_m2} — AB (ref >90)
GLUCOSE: 143 mg/dL — AB (ref 70–140)
POTASSIUM: 4 meq/L (ref 3.5–5.1)
SODIUM: 141 meq/L (ref 136–145)
Total Bilirubin: 0.65 mg/dL (ref 0.20–1.20)
Total Protein: 6.6 g/dL (ref 6.4–8.3)

## 2016-01-02 LAB — CBC WITH DIFFERENTIAL/PLATELET
BASO%: 0.6 % (ref 0.0–2.0)
BASOS ABS: 0 10*3/uL (ref 0.0–0.1)
EOS ABS: 0.2 10*3/uL (ref 0.0–0.5)
EOS%: 3.7 % (ref 0.0–7.0)
HCT: 40.5 % (ref 34.8–46.6)
HGB: 13.3 g/dL (ref 11.6–15.9)
LYMPH%: 29 % (ref 14.0–49.7)
MCH: 30.5 pg (ref 25.1–34.0)
MCHC: 32.8 g/dL (ref 31.5–36.0)
MCV: 92.9 fL (ref 79.5–101.0)
MONO#: 0.3 10*3/uL (ref 0.1–0.9)
MONO%: 7 % (ref 0.0–14.0)
NEUT#: 2.8 10*3/uL (ref 1.5–6.5)
NEUT%: 59.7 % (ref 38.4–76.8)
PLATELETS: 165 10*3/uL (ref 145–400)
RBC: 4.37 10*6/uL (ref 3.70–5.45)
RDW: 13.4 % (ref 11.2–14.5)
WBC: 4.7 10*3/uL (ref 3.9–10.3)
lymph#: 1.4 10*3/uL (ref 0.9–3.3)

## 2016-01-02 LAB — IRON AND TIBC
%SAT: 27 % (ref 21–57)
Iron: 71 ug/dL (ref 41–142)
TIBC: 263 ug/dL (ref 236–444)
UIBC: 192 ug/dL (ref 120–384)

## 2016-01-02 LAB — FERRITIN: Ferritin: 118 ng/ml (ref 9–269)

## 2016-01-02 NOTE — Progress Notes (Signed)
[facility] FOLLOW UP NOTE  Patient Care Team: [name], MD as PCP - General Suella Broad, MD (Physical Medicine and Rehabilitation) Hillary Bow, MD (Cardiology) Newton Pigg, MD (Obstetrics and Gynecology)  CHIEF COMPLAINTS Follow up anemia and MGUS   INITIAL PRESENTATION (first consult):  [name] [name] 75 y.o. female is being referred by Dr. Deatra Ina because of anemia.   She had lumbar and thoracic spine fusion surgery for kyphosis in Feb 2015 and Sep 2015, and required blood transfusion 2u each time after surgery. She was discharged to rehab after surgery, and she received 2u RBC in Nov 4th, 2015 for Hb 7.8g/dl. She was fatigued when her Hg was low, but still able to do some rehab. She had multiple full in the past few month and was hospitalized last month. She still has diffuse body pain from the recent fall, quite fatigued, is only able to walk a short distance, (+) dyspnea and chest heaviness on exertion. No nausea, no change of her bowel habits. Her appetite is moderate to low, she lost about 15-20lbs in the past year.  She denied any bleeding episodes including hematochezia, melana, hemoptysis, hematuria or epitaxis. No mucosal bleeding or easy bruising. She was finally discharged home on 10/30/14, still getting home PT/OT. She came in today with a wheelchair.   Per her daughter, she has had intermittent confusion and anxiety lately, she seems to have some cognitive function dificiency also. Pt her self contributes to some of her medications.   She has last colonoscopy about 3 years ago, she probably had polyps removed. Her last mammogram was one year ago which was normal. Per her daughter, she has a normal CT abdomen and pelvis about 5-6 month ago which was ordered for some abdominal symptoms. She has been on iron pill (1 daily) a few weeks ago.    CURRENT THERAPY: Observation   INTERVAL HISTORY  [name] Eick returns for follow up. She is accompanied by her  daughter to clinic today. She doing well overall, able take your herself at home, and he uses a walker. Her daughter reports she has some memory issue and intermittent confusion, was seen by a neurologist, was diagnosed with dementia. She did try Aricept but could not tolerate it. Patient denies any other neurological symptoms. Her energy level is decent, she denies any episodes of bleeding. She takes iron pill once daily at night.   MEDICAL HISTORY:  Past Medical History  Diagnosis Date  . Obesity   . Atrial fibrillation    . CTS (carpal tunnel syndrome)   . Diarrhea   . IBS (irritable bowel syndrome)   . Diverticulitis of colon   . Gastritis   . Esophageal stricture, s/p dilation    . Personal history of colonic polyps 10/25/2011 & 12/02/11    not retrieved Dr Lyla Son & tubular adenomas  . Hiatal hernia   . GERD (gastroesophageal reflux disease)   . Obstructive sleep apnea   . Insomnia   . HTN (hypertension)   . Hyperlipidemia   . Depression/anxiety    . DIASTOLIC HEART FAILURE, CHRONIC 04/04/2009    Qualifier: Diagnosis of  By: Mare Ferrari, RMA, Sherri      SURGICAL HISTORY: Past Surgical History  Procedure Laterality Date  . Carpal tunnel release Right 1980's  . Total knee arthroplasty Bilateral 1990's - 2000's  . Cataract extraction Bilateral   . Left heart catheterization with coronary angiogram N/A 12/27/2014    Procedure: LEFT HEART CATHETERIZATION WITH  CORONARY ANGIOGRAM;  Surgeon: Larey Dresser, MD;  Location: [facility] CATH LAB;  Service: Cardiovascular;  Laterality: N/A;  . Percutaneous coronary rotoblator intervention (pci-r)  01/07/2015  . Cardiac catheterization  12/27/2014    Procedure: INTRAVASCULAR PRESSURE WIRE/FFR STUDY;  Surgeon: Larey Dresser, MD;  Location: [facility] CATH LAB;  Service: Cardiovascular;;  . Coronary angioplasty with stent placement  01/07/2015    "2"  . Laparoscopic cholecystectomy  2003  . Joint replacement    . Knee arthroscopy Left 1995  . Back  surgery    . Posterior lumbar fusion  01/2015  . Posterior fusion thoracic spine  08/2015  . Dilation and curettage of uterus    . Cataract extraction w/ intraocular lens  implant, bilateral  2000's  . Esophagogastroduodenoscopy (egd) with esophageal dilation  "several times"  . Bone marrow biopsy  11/2014  . Percutaneous coronary rotoblator intervention (pci-r) N/A 01/07/2015    Procedure: PERCUTANEOUS CORONARY ROTOBLATOR INTERVENTION (PCI-R);  Surgeon: Blane Ohara, MD;  Location: [facility] CATH LAB;  Service: Cardiovascular;  Laterality: N/A;  . Coronary stent placement      SOCIAL HISTORY: Social History   Social History  . Marital Status: Widowed    Spouse Name: N/A  . Number of Children: 2  . Years of Education: HS   Occupational History  . librarian- retired     retired   Social History Main Topics  . Smoking status: Never Smoker   . Smokeless tobacco: Never Used  . Alcohol Use: No     Comment: 01/07/2015 "glass of wine at Christmas, maybe"  . Drug Use: No  . Sexual Activity: No   Other Topics Concern  . Not on file   Social History Narrative   Patient is right handed   Patient drinks 1-2 sodas daily.   patlient lives alone.       FAMILY HISTORY: Family History  Problem Relation Age of Onset  . Coronary artery disease Father   . Peripheral vascular disease Father   . Coronary artery disease Mother   . Coronary artery disease Brother   . Colon cancer Sister at 1   . Emphysema Sister   . Sleep apnea Son   . Colon cancer Sister at 61     spread to her brain  No family history of blood disorders or other malignancy   ALLERGIES:  is allergic to cymbalta; morphine and related; oxycodone-acetaminophen; valium; altace; floxin; lipitor; lovaza; pravachol; trilipix; zetia; and zocor.  MEDICATIONS:  Current Outpatient Prescriptions  Medication Sig Dispense Refill  . amLODipine (NORVASC) 2.5 MG tablet Take 1 tablet by mouth  daily for hypertension 90 tablet 0  .  aspirin EC 81 MG tablet Take 1 tablet (81 mg total) by mouth daily.    . Calcium Carbonate-Vit D-Min (CALCIUM 1200) 1200-1000 MG-UNIT CHEW Chew 1 each by mouth daily.    . clopidogrel (PLAVIX) 75 MG tablet Take 1 tablet (75 mg total) by mouth daily. 90 tablet 3  . colesevelam (WELCHOL) 625 MG tablet Take 625 mg by mouth 2 (two) times daily with a meal.     . diphenoxylate-atropine (LOMOTIL) 2.5-0.025 MG per tablet Take 1 tablet by mouth 4 (four) times daily as needed for diarrhea or loose stools.     . Eszopiclone 3 MG TABS Take 1 tablet (3 mg total) by mouth at bedtime. For insomnia,take immediately before bedtime. 90 tablet 1  . fentaNYL (DURAGESIC - DOSED MCG/HR) 50 MCG/HR Place 1 patch (50 mcg total) onto  the skin every 3 (three) days. 10 patch 0  . FLUoxetine (PROZAC) 40 MG capsule Take 1 capsule by mouth  daily for depression 90 capsule 0  . furosemide (LASIX) 20 MG tablet Take 1 tablet (20 mg total) by mouth daily. 90 tablet 1  . furosemide (LASIX) 40 MG tablet 1 tablet (48m)  by mouth daily alternating with 1/2 tablet (239m  by mouth daily 45 tablet 1  . HYDROcodone-acetaminophen (NORCO) 10-325 MG per tablet Take 1 tablet by mouth every 4 (four) hours as needed for moderate pain.     . Marland Kitchenbuprofen (ADVIL,MOTRIN) 800 MG tablet Take 1 tablet (800 mg total) by mouth 3 (three) times daily. 21 tablet 0  . iron polysaccharides (NIFEREX) 150 MG capsule Take 1 capsule (150 mg total) by mouth daily. 90 capsule 3  . isosorbide mononitrate (IMDUR) 30 MG 24 hr tablet Take 1 tablet (30 mg total) by mouth daily. 90 tablet 3  . LORazepam (ATIVAN) 0.5 MG tablet Take 1 tablet (0.5 mg total) by mouth 2 (two) times daily as needed for anxiety. 60 tablet 1  . meclizine (ANTIVERT) 25 MG tablet Take 1 tablet (25 mg total) by mouth 3 (three) times daily as needed for dizziness. 60 tablet 0  . methocarbamol (ROBAXIN) 500 MG tablet Take 1 tablet by mouth 3 (three) times daily as needed. PRN  0  . methocarbamol  (ROBAXIN) 500 MG tablet Take 1 tablet (500 mg total) by mouth 2 (two) times daily. 20 tablet 0  . Multiple Vitamin (MULTIVITAMIN) tablet Take 1 tablet by mouth daily. For supplement    . nitroGLYCERIN (NITROSTAT) 0.4 MG SL tablet Place 1 tablet (0.4 mg total) under the tongue every 5 (five) minutes as needed for chest pain (up to 3 doses). 25 tablet 3  . pantoprazole (PROTONIX) 40 MG tablet Take 1 tablet (40 mg total) by mouth 2 (two) times daily. 180 tablet 1  . rosuvastatin (CRESTOR) 20 MG tablet Take 0.5-1 tablets (10-20 mg total) by mouth every other day. 20 mg one day alternate 10 mg the next day and repeats     No current facility-administered medications for this visit.    REVIEW OF SYSTEMS:   Constitutional: Denies fevers, chills or abnormal night sweats Eyes: Denies blurriness of vision, double vision or watery eyes Ears, nose, mouth, throat, and face: Denies mucositis or sore throat Respiratory: Denies cough, dyspnea or wheezes Cardiovascular: Denies palpitation, chest discomfort or lower extremity swelling Gastrointestinal:  Denies nausea, heartburn or change in bowel habits Skin: Denies abnormal skin rashes Lymphatics: Denies new lymphadenopathy or easy bruising Neurological:Denies numbness, tingling or new weaknesses, (+) intermittent confusion, (+) chronic back pain  Behavioral/Psych: (+) anxiety, no new changes  All other systems were reviewed with the patient and are negative.  PHYSICAL EXAMINATION:  Filed Vitals:   01/02/16 1338  BP: 116/87  Pulse: 80  Temp: 99.1 F (37.3 C)  Resp: 18   Filed Weights   01/02/16 1338  Weight: 199 lb 9.6 oz (90.538 kg)    GENERAL:alert, no distress and comfortable, sitting in wheelchair  SKIN: skin color, texture, turgor are normal, no rashes or significant lesions EYES: normal, conjunctiva are pink and non-injected, sclera clear OROPHARYNX:no exudate, no erythema and lips, buccal mucosa, and tongue normal  NECK: supple,  thyroid normal size, non-tender, without nodularity LYMPH:  no palpable lymphadenopathy in the cervical, axillary or inguinal LUNGS: clear to auscultation and percussion with normal breathing effort HEART: regular rate & rhythm and no murmurs  and no lower extremity edema ABDOMEN:abdomen soft, non-tender and normal bowel sounds Musculoskeletal:no cyanosis of digits and no clubbing. (+) surgical scar at her thoracic and lumbar spine  PSYCH: alert & oriented x 3 with fluent speech, but argues with her daughter a lot  NEURO: no focal motor/sensory deficits  LABORATORY DATA:  CBC Latest Ref Rng 01/02/2016 11/25/2015 10/11/2015  WBC 3.9 - 10.3 10e3/uL 4.7 6.2 6.4  Hemoglobin 11.6 - 15.9 g/dL 13.3 - -  Hematocrit 34.8 - 46.6 % 40.5 39.0 40.6  Platelets 145 - 400 10e3/uL 165 194 226   CMP Latest Ref Rng 01/02/2016 10/11/2015 08/15/2015  Glucose 70 - 140 mg/dl 143(H) 104(H) 99  BUN 7.0 - 26.0 mg/dL 11.9 15 12   Creatinine 0.6 - 1.1 mg/dL 0.9 0.77 0.81  Sodium 136 - 145 mEq/L 141 141 141  Potassium 3.5 - 5.1 mEq/L 4.0 4.3 4.0  Chloride 97 - 106 mmol/L - 97 96(L)  CO2 22 - 29 mEq/L 27 24 28   Calcium 8.4 - 10.4 mg/dL 9.1 9.3 9.3  Total Protein 6.4 - 8.3 g/dL 6.6 6.6 -  Total Bilirubin 0.20 - 1.20 mg/dL 0.65 0.6 -  Alkaline Phos 40 - 150 U/L 81 85 -  AST 5 - 34 U/L 19 27 -  ALT 0 - 55 U/L 15 17 -     SURGICAL PATH 12/07/14 Bone Marrow, Aspirate,Biopsy, and Clot, right post iliac - HYPERCELLULAR BONE MARROW FOR AGE WITH TRILINEAGE HEMATOPOIESIS AND 5% PLASMA CELLS. - SEE COMMENT. PERIPHERAL BLOOD: - NORMOCYTIC-NORMOCHROMIC ANEMIA. Diagnosis Note The bone marrow is hypercellular for age with trilineage hematopoiesis and essentially orderly and progressive maturation of all myeloid cell types. The plasma cells represent 5% of all cells associated with occasional small clusters. Immunohistochemical stains apparently show polyclonal staining pattern in plasma cells for kappa and lambda light  chains. The appearance is non specific and not diagnostic of plasma cell dyscrasia/neoplasm. Correlation  Cytogenetics: normal    RADIOGRAPHIC STUDIES: I have personally reviewed the radiological images as listed and agreed with the findings in the report. Ct Head Wo Contrast 10/27/2014   IMPRESSION: No acute intracranial abnormalities. Chronic atrophy and small vessel ischemic changes.     Ct Cervical Spine Wo Contrast 10/27/2014 IMPRESSION:  1. No evidence of acute fracture or malalignment.  2. Multilevel cervical spondylosis and facet arthropathy without focality.  3. Atherosclerotic vascular calcifications in both carotid arteries.      ASSESSMENT & PLAN:  [name] [name] is a 75 year old female with multiple comorbidities, who presents with moderate persistent anemia after her spinal fusion surgery which required blood transfusion 3 times.  1. Aanemia of iron deficiency -I previously reviewed her lab result with her and  Her daughter. No lab evidence of megaloblastic anemia, or hemolysis.   -She has normal iron level and ferritin, but low iron saturation and elevated soluble transferrin receptor, and history of very low ferritin in the past (<10), which support IDA. Her bone marrow biopsy also showed low iron staining. -Anemia has spontaneously resolved. I suggest her continue to take by mouth niferex 1 tab daily.  -We'll continue monitor her CBC periodically.  2. MGUS, IgG  -SPEP showed low level M-protein (0.3), immunofixation was consistent with IgG lambda protein.  Quantitative immunoglobulin level and light chain levels were grossly normal. -I reviewed her bone marrow biopsy results, which showed 5% plasma cell, no monoclonal light chain deposition. -she likely has MGUS. No evidence of multiple myeloma. -Her SPEP results is still pending from today. I'll  call her next week with the result.  3. Dementia, anxiety -She could not tolerate Aricept. Follow up with neurologist.  4. HTN,  AF, dyslipidemia  -follow up with PCP   Plan -I'll call her next week for her lab results. -Return to clinic for follow-up in 6 months with lab  All questions were answered. The patient knows to call the clinic with any problems, questions or concerns.  I spent 20 minutes counseling the patient face to face. The total time spent in the appointment was 30 minutes and more than 50% was on counseling.     Truitt Merle, MD 01/02/2016 9:51 PM

## 2016-01-02 NOTE — Telephone Encounter (Signed)
Scheduled patient appt per pof, avs report printed.  °

## 2016-01-03 LAB — KAPPA/LAMBDA LIGHT CHAINS
IG KAPPA FREE LIGHT CHAIN: 24.17 mg/L — AB (ref 3.30–19.40)
IG LAMBDA FREE LIGHT CHAIN: 23.7 mg/L (ref 5.71–26.30)
Kappa/Lambda FluidC Ratio: 1.02 (ref 0.26–1.65)

## 2016-01-09 LAB — MULTIPLE MYELOMA PANEL, SERUM
Albumin SerPl Elph-Mcnc: 3.6 g/dL (ref 2.9–4.4)
Albumin/Glob SerPl: 1.4 (ref 0.7–1.7)
Alpha 1: 0.2 g/dL (ref 0.0–0.4)
Alpha2 Glob SerPl Elph-Mcnc: 0.8 g/dL (ref 0.4–1.0)
B-GLOBULIN SERPL ELPH-MCNC: 0.9 g/dL (ref 0.7–1.3)
GAMMA GLOB SERPL ELPH-MCNC: 0.8 g/dL (ref 0.4–1.8)
Globulin, Total: 2.6 g/dL (ref 2.2–3.9)
IgA, Qn, Serum: 156 mg/dL (ref 64–422)
IgG, Qn, Serum: 832 mg/dL (ref 700–1600)
IgM, Qn, Serum: 86 mg/dL (ref 26–217)
M PROTEIN SERPL ELPH-MCNC: 0.2 g/dL — AB
TOTAL PROTEIN: 6.2 g/dL (ref 6.0–8.5)

## 2016-01-13 ENCOUNTER — Other Ambulatory Visit: Payer: Self-pay | Admitting: Family Medicine

## 2016-01-13 MED ORDER — AMLODIPINE BESYLATE 2.5 MG PO TABS: ORAL_TABLET | ORAL | Status: DC

## 2016-01-13 MED ORDER — ROSUVASTATIN CALCIUM 20 MG PO TABS: ORAL_TABLET | ORAL | Status: DC

## 2016-01-13 MED ORDER — FLUOXETINE HCL 40 MG PO CAPS: ORAL_CAPSULE | ORAL | Status: DC

## 2016-01-16 ENCOUNTER — Encounter: Payer: Self-pay | Admitting: Physician Assistant

## 2016-01-16 ENCOUNTER — Ambulatory Visit (INDEPENDENT_AMBULATORY_CARE_PROVIDER_SITE_OTHER): Payer: Medicare HMO | Admitting: Physician Assistant

## 2016-01-16 ENCOUNTER — Telehealth: Payer: Self-pay | Admitting: Cardiology

## 2016-01-16 ENCOUNTER — Other Ambulatory Visit: Payer: Self-pay | Admitting: *Deleted

## 2016-01-16 ENCOUNTER — Telehealth: Payer: Self-pay | Admitting: *Deleted

## 2016-01-16 VITALS — BP 100/60 | HR 110 | Ht 67.0 in | Wt 199.0 lb

## 2016-01-16 DIAGNOSIS — E785 Hyperlipidemia, unspecified: Secondary | ICD-10-CM | POA: Diagnosis not present

## 2016-01-16 DIAGNOSIS — I4891 Unspecified atrial fibrillation: Secondary | ICD-10-CM

## 2016-01-16 DIAGNOSIS — I480 Paroxysmal atrial fibrillation: Secondary | ICD-10-CM

## 2016-01-16 DIAGNOSIS — I4901 Ventricular fibrillation: Secondary | ICD-10-CM | POA: Diagnosis not present

## 2016-01-16 DIAGNOSIS — I2510 Atherosclerotic heart disease of native coronary artery without angina pectoris: Secondary | ICD-10-CM

## 2016-01-16 DIAGNOSIS — Z01810 Encounter for preprocedural cardiovascular examination: Secondary | ICD-10-CM | POA: Diagnosis not present

## 2016-01-16 DIAGNOSIS — I482 Chronic atrial fibrillation: Secondary | ICD-10-CM

## 2016-01-16 DIAGNOSIS — I10 Essential (primary) hypertension: Secondary | ICD-10-CM | POA: Diagnosis not present

## 2016-01-16 DIAGNOSIS — R9431 Abnormal electrocardiogram [ECG] [EKG]: Secondary | ICD-10-CM | POA: Diagnosis not present

## 2016-01-16 DIAGNOSIS — I5032 Chronic diastolic (congestive) heart failure: Secondary | ICD-10-CM

## 2016-01-16 LAB — CBC WITH DIFFERENTIAL/PLATELET
BASOS ABS: 0 10*3/uL (ref 0.0–0.1)
Basophils Relative: 0 % (ref 0–1)
Eosinophils Absolute: 0.2 10*3/uL (ref 0.0–0.7)
Eosinophils Relative: 3 % (ref 0–5)
HEMATOCRIT: 39 % (ref 36.0–46.0)
Hemoglobin: 13 g/dL (ref 12.0–15.0)
LYMPHS ABS: 2.6 10*3/uL (ref 0.7–4.0)
LYMPHS PCT: 40 % (ref 12–46)
MCH: 31.3 pg (ref 26.0–34.0)
MCHC: 33.3 g/dL (ref 30.0–36.0)
MCV: 93.8 fL (ref 78.0–100.0)
MONOS PCT: 7 % (ref 3–12)
MPV: 10.7 fL (ref 8.6–12.4)
Monocytes Absolute: 0.5 10*3/uL (ref 0.1–1.0)
NEUTROS PCT: 50 % (ref 43–77)
Neutro Abs: 3.3 10*3/uL (ref 1.7–7.7)
Platelets: 197 10*3/uL (ref 150–400)
RBC: 4.16 MIL/uL (ref 3.87–5.11)
RDW: 13.3 % (ref 11.5–15.5)
WBC: 6.6 10*3/uL (ref 4.0–10.5)

## 2016-01-16 MED ORDER — METOPROLOL SUCCINATE ER 25 MG PO TB24: 25.0000 mg | ORAL_TABLET | Freq: Two times a day (BID) | ORAL | Status: DC

## 2016-01-16 MED ORDER — FUROSEMIDE 40 MG PO TABS: ORAL_TABLET | ORAL | Status: DC

## 2016-01-16 MED ORDER — PANTOPRAZOLE SODIUM 40 MG PO TBEC: 40.0000 mg | DELAYED_RELEASE_TABLET | Freq: Two times a day (BID) | ORAL | Status: DC

## 2016-01-16 MED ORDER — AMLODIPINE BESYLATE 2.5 MG PO TABS: ORAL_TABLET | ORAL | Status: DC

## 2016-01-16 MED ORDER — CLOPIDOGREL BISULFATE 75 MG PO TABS: 75.0000 mg | ORAL_TABLET | Freq: Every day | ORAL | Status: DC

## 2016-01-16 MED ORDER — ROSUVASTATIN CALCIUM 20 MG PO TABS: ORAL_TABLET | ORAL | Status: DC

## 2016-01-16 MED ORDER — ISOSORBIDE MONONITRATE ER 30 MG PO TB24: 30.0000 mg | ORAL_TABLET | Freq: Every day | ORAL | Status: DC

## 2016-01-16 MED ORDER — APIXABAN 5 MG PO TABS: 5.0000 mg | ORAL_TABLET | Freq: Two times a day (BID) | ORAL | Status: DC

## 2016-01-16 MED ORDER — FLUOXETINE HCL 40 MG PO CAPS: ORAL_CAPSULE | ORAL | Status: DC

## 2016-01-16 MED ORDER — ISOSORBIDE MONONITRATE ER 30 MG PO TB24: 15.0000 mg | ORAL_TABLET | Freq: Every day | ORAL | Status: DC

## 2016-01-16 NOTE — Telephone Encounter (Signed)
New message     Pt was scheduled to have a pre-op appt at [facility] for a colonscopy. They took her pulse and her HR was high.  They did an ekg to confirm that she is in afib.HR was as high as 138.  They are leaving [facility] now.  Please call and let them know if pt needs to be seen or go to the ER.

## 2016-01-16 NOTE — Telephone Encounter (Signed)
Reviewed with Almyra Deforest, PA and pt should see provider in office tomorrow on flex schedule.  I have scheduled this appt with Cecilie Kicks, NP tomorrow at 11:30.  I returned call to pt's daughter and she requests pt be seen today. Will review with provider in office.

## 2016-01-16 NOTE — Patient Instructions (Signed)
Medication Instructions:  1. STOP NORVASC   2. STOP ASPIRIN  3. STOP PLAVIX  4. DECREASE IMDUR TO 1/2 TABLET = 15 MG DAILY  5. START ELIQUIS 5 MG DAILY; YOU R FIRST DOSE TONIGHT ; STARTING TOMORROW YOU WILL BE ON ELIQUIS 5 MG TWICE DAILY; MAKE SURE TO SPACE OUT 12 HOURS APART.  6. START TOPROL XL 25 MG 1 TABLET TWICE DAILY  Labwork: 1. TODAY BMET, CBC W/DIFF, TSH  Testing/Procedures: 1. Your physician has requested that you have an echocardiogram. Echocardiography is a painless test that uses sound waves to create images of your heart. It provides your doctor with information about the size and shape of your heart and how well your heart's chambers and valves are working. This procedure takes approximately one hour. There are no restrictions for this procedure.   Follow-Up: 3 WEEKS WITH DR. Aundra Dubin OR [facility], PAC SAME DAY DR. Aundra Dubin IS IN THE OFFICE  Any Other Special Instructions Will Be Listed Below (If Applicable). YOU HAVE BEEN GIVEN AN RX FOR TO HAVE AN EKG AND BP CHECK WITH PRIMARY CARE TO BE DONE IN 1 WEEK; PLEASE HAVE THE RESULTS FAXED TO [facility], [place]  If you need a refill on your cardiac medications before your next appointment, please call your pharmacy.

## 2016-01-16 NOTE — Progress Notes (Addendum)
Cardiology Office Note:    Date:  01/17/2016   ID:  [name] [name], DOB [birth year], MRN [id number]  PCP:  Redge Gainer, MD  Cardiologist:  Dr. Loralie Champagne   Electrophysiologist:  n/a  Chief Complaint  Patient presents with  . Atrial Fibrillation    History of Present Illness:    [name] [name] is a 75 y.o. female with a hx of CAD, diastolic HF, HTN, HL, PAF.  She has had multiple back surgeries.  She has a hx of orthostatic hypotension and multiple falls as well as confusion.  She has been kept off of anticoagulation.  She had chest pain in 12/15 and Myoview was abnormal.  LHC demonstrated complex bifurcational distal LM/ostial LCx disease. She was felt to be a poor CABG candidate due to difficulty with rehab.  She underwent bifurcational PCI with DES to the distal LM and ostial LCx.  Last seen by Dr. Loralie Champagne 11/16.  At that time, it was noted if she had recurrent AFib, anticoagulation would need to be started.   She was at [facility] today for pre-op workup for upcoming colonoscopy.  She was noted to be in AFib and asked to FU here. She was added on to my schedule for evaluation.  She is here today with her daughter.  She has noted increased HR with activities over the last several weeks. She has chronic DOE.  She denies any worsening.  She denies chest pain.  She denies orthopnea, PND, edema.  She denies syncope.  She has fallen x 3 in the last 12 mos.  Last fall was in Dec and she hit her head.  CT of the head was neg in the ED 11/2015.  She uses a walker to ambulate.     Past Medical History  Diagnosis Date  . Obesity   . Circadian rhythm sleep disorder   . CTS (carpal tunnel syndrome)   . Diarrhea   . IBS (irritable bowel syndrome)   . Diverticulitis of colon   . Gastritis   . Esophageal stricture   . Personal history of colonic polyps 10/25/2011 & 12/02/11    not retrieved Dr Lyla Son & tubular adenomas  . Hiatal hernia   . GERD (gastroesophageal reflux disease)   .  Insomnia   . HTN (hypertension)   . Hyperlipidemia   . Depression   . Chronic diastolic CHF (congestive heart failure) ([place])   . Memory disorder 12/04/2014  . Pneumonia 03/2014  . Obstructive sleep apnea     "have mask; don't wear it" (01/07/2015)  . Anemia   . History of blood transfusion     "most of them related to OR's"   . MGUS (monoclonal gammopathy of unknown significance) dx'd 11/2014    a. Neg BMB 11/2014.  . Stroke [facility]) early 2000's    "small"; denies residual on 01/07/2015)  . Arthritis     "knees, back, fingers, toes; joints" (01/07/2015)  . Chronic back pain greater than 3 months duration     a. spinal stenosis. Spinal fusion with rods in 2/15 at [facility] spinal fusion 9/15.  Marland Kitchen Anxiety   . CAD (coronary artery disease)     a. Abnl nuc 11/2014. Cath 12/2014 - turned down for CABG. Ultimately s/p TTVP, rotational atherectomy, PTCA and stenting of the ostial LCx and left main into the LAD (crush technique), and IVUS of the LAD/Left main.  Marland Kitchen PAF (paroxysmal atrial fibrillation) ([place])     a.  Paroxysmal. Has  only been noted post-op 9/15 spinal fusion. She had cardioversion, not on anticoagulation. Fall risk, unsteady.  . Sinus bradycardia     a. Baseline HR 50s-60s.  Marland Kitchen PAT (paroxysmal atrial tachycardia) ([place])   . Multiple falls   . Orthostasis   . PONV (postoperative nausea and vomiting) 03/22/2015  1. Chronic low back pain: spinal stenosis. Spinal fusion with rods in 2/15 at [facility]. Repeat spinal fusion 9/15.  2. Obesity 3. H/o diverticulitis 4. IBS 5. H/o colon polyps 6. GERD: h/o hiatal hernia 7. OSA 8. HTN 9. Hyperlipidemia 10. Depression 11. Cholecystectomy 12. H/o bilateral TKR 13. CAD: LHC (5/09) with 50% ostial LCx, 30-40% mRCA, 30% mLAD. Lexiscan Cardiolite in 11/14 with EF 68%, no ischemia or infarction. Lexiscan Cardiolite (12/15) with EF 57%, moderate anteroapical and anteroseptal ischemia, inferolateral infarct with peri-infarct  ischemia => suggestive of multivessel disease. LHC (1/16) with severe distal left main and ostial LCx stenosis confirmed by FFR. Patient had bifurcation stenting of the distal LM/ostial LCx with rotational atherectomy.  14. PNA in 3/15 15. Atrial fibrillation: Paroxysmal. Has only been noted post-op 9/15 spinal fusion. She had cardioversion, not on anticoagulation.  16. MGUS: Negative bone marrow biopsy 12/15.  17. Anemia   Past Surgical History  Procedure Laterality Date  . Carpal tunnel release Right 1980's  . Total knee arthroplasty Bilateral 1990's - 2000's  . Cataract extraction Bilateral   . Left heart catheterization with coronary angiogram N/A 12/27/2014    Procedure: LEFT HEART CATHETERIZATION WITH CORONARY ANGIOGRAM;  Surgeon: Larey Dresser, MD;  Location: [facility] CATH LAB;  Service: Cardiovascular;  Laterality: N/A;  . Percutaneous coronary rotoblator intervention (pci-r)  01/07/2015  . Cardiac catheterization  12/27/2014    Procedure: INTRAVASCULAR PRESSURE WIRE/FFR STUDY;  Surgeon: Larey Dresser, MD;  Location: [facility] CATH LAB;  Service: Cardiovascular;;  . Coronary angioplasty with stent placement  01/07/2015    "2"  . Laparoscopic cholecystectomy  2003  . Joint replacement    . Knee arthroscopy Left 1995  . Back surgery    . Posterior lumbar fusion  01/2015  . Posterior fusion thoracic spine  08/2015  . Dilation and curettage of uterus    . Cataract extraction w/ intraocular lens  implant, bilateral  2000's  . Esophagogastroduodenoscopy (egd) with esophageal dilation  "several times"  . Bone marrow biopsy  11/2014  . Percutaneous coronary rotoblator intervention (pci-r) N/A 01/07/2015    Procedure: PERCUTANEOUS CORONARY ROTOBLATOR INTERVENTION (PCI-R);  Surgeon: Blane Ohara, MD;  Location: [facility] CATH LAB;  Service: Cardiovascular;  Laterality: N/A;  . Coronary stent placement      Current Medications: Outpatient Prescriptions Prior to Visit  Medication Sig Dispense Refill   . Calcium Carbonate-Vit D-Min (CALCIUM 1200) 1200-1000 MG-UNIT CHEW Chew 1 each by mouth daily.    . colesevelam (WELCHOL) 625 MG tablet Take 625 mg by mouth 2 (two) times daily with a meal.     . diphenoxylate-atropine (LOMOTIL) 2.5-0.025 MG per tablet Take 1 tablet by mouth 4 (four) times daily as needed for diarrhea or loose stools.     . Eszopiclone 3 MG TABS Take 1 tablet (3 mg total) by mouth at bedtime. For insomnia,take immediately before bedtime. 90 tablet 1  . fentaNYL (DURAGESIC - DOSED MCG/HR) 50 MCG/HR Place 1 patch (50 mcg total) onto the skin every 3 (three) days. 10 patch 0  . FLUoxetine (PROZAC) 40 MG capsule Take 1 capsule by mouth  daily for depression 90 capsule 3  .  HYDROcodone-acetaminophen (NORCO) 10-325 MG per tablet Take 1 tablet by mouth every 4 (four) hours as needed for moderate pain.     Marland Kitchen ibuprofen (ADVIL,MOTRIN) 800 MG tablet Take 1 tablet (800 mg total) by mouth 3 (three) times daily. 21 tablet 0  . iron polysaccharides (NIFEREX) 150 MG capsule Take 1 capsule (150 mg total) by mouth daily. 90 capsule 3  . LORazepam (ATIVAN) 0.5 MG tablet Take 1 tablet (0.5 mg total) by mouth 2 (two) times daily as needed for anxiety. 60 tablet 1  . meclizine (ANTIVERT) 25 MG tablet Take 1 tablet (25 mg total) by mouth 3 (three) times daily as needed for dizziness. 60 tablet 0  . methocarbamol (ROBAXIN) 500 MG tablet Take 1 tablet (500 mg total) by mouth 2 (two) times daily. 20 tablet 0  . Multiple Vitamin (MULTIVITAMIN) tablet Take 1 tablet by mouth daily. For supplement    . nitroGLYCERIN (NITROSTAT) 0.4 MG SL tablet Place 1 tablet (0.4 mg total) under the tongue every 5 (five) minutes as needed for chest pain (up to 3 doses). 25 tablet 3  . pantoprazole (PROTONIX) 40 MG tablet Take 1 tablet (40 mg total) by mouth 2 (two) times daily. 180 tablet 3  . amLODipine (NORVASC) 2.5 MG tablet Take 1 tablet by mouth  daily for hypertension 90 tablet 3  . aspirin EC 81 MG tablet Take 1 tablet  (81 mg total) by mouth daily.    . clopidogrel (PLAVIX) 75 MG tablet Take 1 tablet (75 mg total) by mouth daily. 90 tablet 3  . isosorbide mononitrate (IMDUR) 30 MG 24 hr tablet Take 1 tablet (30 mg total) by mouth daily. 90 tablet 3  . furosemide (LASIX) 40 MG tablet Take 1 tab alternating with 1/2 tab daily (Patient not taking: Reported on 01/16/2016) 135 tablet 3  . methocarbamol (ROBAXIN) 500 MG tablet Take 1 tablet by mouth 3 (three) times daily as needed. Reported on 01/16/2016  0  . rosuvastatin (CRESTOR) 20 MG tablet Take 1/2 to whole tab daily as directed (Patient not taking: Reported on 01/16/2016) 90 tablet 3   No facility-administered medications prior to visit.     Allergies:   Cymbalta; Morphine and related; Oxycodone-acetaminophen; Percocet; Valium; Duloxetine; Altace; Floxin; Hydromorphone; Lipitor; Lovaza; Pravachol; Trilipix; Zetia; and Zocor   Social History   Social History  . Marital Status: Widowed    Spouse Name: N/A  . Number of Children: 2  . Years of Education: HS   Occupational History  . librarian- retired     retired   Social History Main Topics  . Smoking status: Never Smoker   . Smokeless tobacco: Never Used  . Alcohol Use: No     Comment: 01/07/2015 "glass of wine at Christmas, maybe"  . Drug Use: No  . Sexual Activity: No   Other Topics Concern  . None   Social History Narrative   Patient is right handed   Patient drinks 1-2 sodas daily.   patlient lives alone.        Family History:  The patient's family history includes Colon cancer in her sister and sister; Coronary artery disease in her brother, father, and mother; Emphysema in her brother and sister; Peripheral vascular disease in her father; Sleep apnea in her son. There is no history of Dementia.   ROS:   Please see the history of present illness.    Review of Systems  Constitution: Negative for fever.  Respiratory: Negative for cough.   Gastrointestinal: Negative  for hematochezia  and melena.  Genitourinary: Negative for hematuria.  All other systems reviewed and are negative.   Physical Exam:    VS:  BP 100/60 mmHg  Pulse 110  Ht '5\' 7"'$  (1.702 m)  Wt 199 lb (90.266 kg)  BMI 31.16 kg/m2       GEN: Well nourished, well developed, in no acute distress HEENT: normal Neck: no JVD, no masses Cardiac: Normal S1/S2, irreg irreg rhythm; no murmurs  Respiratory:  clear to auscultation bilaterally; no wheezing, rhonchi or rales GI: soft, nontender  MS: no deformity or atrophy Skin: warm and dry, no rash Neuro:   no focal deficits  Psych: Alert and oriented x 3, normal affect  Wt Readings from Last 3 Encounters:  01/16/16 199 lb (90.266 kg)  01/02/16 199 lb 9.6 oz (90.538 kg)  10/24/15 197 lb 12.8 oz (89.721 kg)      Studies/Labs Reviewed:    EKG:  EKG is  ordered today.  The ekg ordered today demonstrates AFib, HR 115  Recent Labs: 08/15/2015: BNP 43.7 01/02/2016: ALT 15 01/16/2016: BUN 11; Creat 0.72; Hemoglobin 13.0; Platelets 197; Potassium 4.4; Sodium 138; TSH 1.690   Recent Lipid Panel    Component Value Date/Time   CHOL 151 10/11/2015 1431   CHOL 158 01/03/2015 1310   CHOL 156 05/11/2013 1536   TRIG 189* 10/11/2015 1431   TRIG 171* 01/03/2015 1310   TRIG 239* 05/11/2013 1536   HDL 56 10/11/2015 1431   HDL 61 01/03/2015 1310   HDL 43 05/11/2013 1536   CHOLHDL 2.6 01/03/2015 1310   LDLCALC 63 01/03/2015 1310   LDLCALC 50 08/21/2014 1141   LDLCALC 65 05/11/2013 1536    Additional studies/ records that were reviewed today include:   LHC 1/16 LM dist ? 50% LAD prox 30% - FFR 0.82 LCx ostial 70% - FFR 0.86 >> 0.66 RCA mid 40% CABG deferred b/c of poor functional capacity PCI:  Complex bifurcational distal LM and ostial LCx with rotational atherectomy and IVUS guided with 2.75 x 12 mm Synergy DES to ostial LCx;  4 x 16 mm Synergy DES to distal LM  Myoview 12/15 Intermediate risk stress nuclear study Moderate area of anteroapical and  anteroseptal ischemia Also inferolateral wall infarct with moderate peri infarct ischemia Study suggests multi vessel diseae.  LV Ejection Fraction: 57%  Echo 7/11 Mild LVH, EF 55-60%, no RWMA, mild MR   ASSESSMENT:    1. Atrial fibrillation with RVR ([place])   2. Coronary artery disease involving native coronary artery of native heart without angina pectoris   3. Chronic diastolic CHF (congestive heart failure) ([place])   4. Essential hypertension   5. Hyperlipidemia     PLAN:    In order of problems listed above:  1. Atrial Fibrillation with RVR - She has a remote hx of PAF at the time of her back surgery.  She has remained in NSR up until this point.  She was a high fall risk in previously and with the need for dual antiplatelet Rx, she has not been treated with anticoagulation.  CHADS2-VASc=4 (age, female, HTN, vascular dz).  With recurrent AFib, she should be on anticoagulation.  I called Dr. Aundra Dubin to discuss her [place].  We agreed that it was safe to start her on anticoagulation. I reviewed the risks and benefits of anticoagulation with the patient and her daughter today.  She is 12 mos out from her PCI and given the need for anticoagulation, Dr. Aundra Dubin recommended stopping  her ASA and Plavix.    -  DC ASA and Plavix  -  Start Eliquis 5 mg bid (age 12, wt 90 kg, Creatinine 0.9)  -  Start Toprol-XL 25 mg bid  -  Get ECG and BP check 1 week with PCP (lives in [place])  -  Schedule Echo   -  Labs today: TSH, BMET, CBC  -  Close FU in 3 weeks.  If in AF >> consider DCCV  -  If cost prohibitive with Eliquis, consider transition to coumadin in future  2. CAD - No angina.  As noted, with need for Eliquis, DC ASA and Plavix.  3. Chronic Diastolic CHF - Volume appears stable.    4. HTN - BP running low. As noted, start Toprol for rate control.  DC Amlodipine.  Decrease Imdur to 15 mg QD.  5. HL- continue statin.    Time spent with patient 45 minutes.  Face to face time > 50%.     Medication Adjustments/Labs and Tests Ordered: Current medicines are reviewed at length with the patient today.  Concerns regarding medicines are outlined above.  Medication changes, Labs and Tests ordered today are outlined in the Patient Instructions noted below. Patient Instructions  Medication Instructions:  1. STOP NORVASC   2. STOP ASPIRIN  3. STOP PLAVIX  4. DECREASE IMDUR TO 1/2 TABLET = 15 MG DAILY  5. START ELIQUIS 5 MG DAILY; YOU R FIRST DOSE TONIGHT ; STARTING TOMORROW YOU WILL BE ON ELIQUIS 5 MG TWICE DAILY; MAKE SURE TO SPACE OUT 12 HOURS APART.  6. START TOPROL XL 25 MG 1 TABLET TWICE DAILY  Labwork: 1. TODAY BMET, CBC W/DIFF, TSH  Testing/Procedures: 1. Your physician has requested that you have an echocardiogram. Echocardiography is a painless test that uses sound waves to create images of your heart. It provides your doctor with information about the size and shape of your heart and how well your heart's chambers and valves are working. This procedure takes approximately one hour. There are no restrictions for this procedure.   Follow-Up: 3 WEEKS WITH DR. Aundra Dubin OR Chidinma Clites, PAC SAME DAY DR. Aundra Dubin IS IN THE OFFICE  Any Other Special Instructions Will Be Listed Below (If Applicable). YOU HAVE BEEN GIVEN AN RX FOR TO HAVE AN EKG AND BP CHECK WITH PRIMARY CARE TO BE DONE IN 1 WEEK; PLEASE HAVE THE RESULTS FAXED TO [place], [place]  If you need a refill on your cardiac medications before your next appointment, please call your pharmacy.       Signed, Richardson Dopp, PA-C  01/17/2016 12:31 PM    [facility] [place], [place], [place]  [address] Phone: [phone]; Fax: [phone]

## 2016-01-16 NOTE — Telephone Encounter (Signed)
Spoke with pt's daughter. Pt was at [facility] today for pre op appt prior to colonoscopy. She was noted to be in atrial fib. Rate ranged from 112-138.  Does not know blood pressure but reports it was OK.  Daughter reports this may have started a couple of days ago when pt's daughter visibly noted rapid heart beat in chest.  Did not check heart rate at that time.  Pt without complaints at this time except for chronic shortness of breath. Daughter reports pt was on beta blocker after stent placed but this has since been stopped.  They are currently driving back to [facility] from [place] and would like instruction on how to proceed.  Will review with provider in office.

## 2016-01-16 NOTE — Telephone Encounter (Signed)
done

## 2016-01-16 NOTE — Telephone Encounter (Signed)
Reviewed with Richardson Dopp, PA and he will see pt this afternoon.  I spoke with pt's daughter and they are currently driving on [facility] and will come in for appt today.

## 2016-01-17 ENCOUNTER — Other Ambulatory Visit: Payer: Self-pay | Admitting: *Deleted

## 2016-01-17 ENCOUNTER — Telehealth: Payer: Self-pay | Admitting: Physician Assistant

## 2016-01-17 ENCOUNTER — Ambulatory Visit: Payer: Medicare HMO | Admitting: Cardiology

## 2016-01-17 DIAGNOSIS — I4891 Unspecified atrial fibrillation: Secondary | ICD-10-CM

## 2016-01-17 LAB — BASIC METABOLIC PANEL
BUN: 11 mg/dL (ref 7–25)
CHLORIDE: 102 mmol/L (ref 98–110)
CO2: 29 mmol/L (ref 20–31)
CREATININE: 0.72 mg/dL (ref 0.60–0.93)
Calcium: 8.9 mg/dL (ref 8.6–10.4)
GLUCOSE: 85 mg/dL (ref 65–99)
Potassium: 4.4 mmol/L (ref 3.5–5.3)
Sodium: 138 mmol/L (ref 135–146)

## 2016-01-17 LAB — TSH: TSH: 1.69 u[IU]/mL (ref 0.350–4.500)

## 2016-01-17 MED ORDER — ESZOPICLONE 3 MG PO TABS: 3.0000 mg | ORAL_TABLET | Freq: Every day | ORAL | Status: DC

## 2016-01-17 MED ORDER — APIXABAN 5 MG PO TABS: 5.0000 mg | ORAL_TABLET | Freq: Two times a day (BID) | ORAL | Status: DC

## 2016-01-17 NOTE — Telephone Encounter (Signed)
*  STAT* If patient is at the pharmacy, call can be transferred to refill team.  Rx sent in yesterday was to [facility]- [facility] [place]. .please advise  1. Which medications need to be refilled? (please list name of each medication and dose if known) eliquis 5mg   2. Which pharmacy/location (including street and city if local pharmacy) is medication to be sent to? [facility] [place]  3. Do they need a 30 day or 90 day supply? [place]

## 2016-01-17 NOTE — Telephone Encounter (Signed)
[facility], patient aware.

## 2016-01-23 ENCOUNTER — Telehealth: Payer: Self-pay | Admitting: *Deleted

## 2016-01-23 ENCOUNTER — Ambulatory Visit (INDEPENDENT_AMBULATORY_CARE_PROVIDER_SITE_OTHER): Payer: Medicare HMO | Admitting: *Deleted

## 2016-01-23 DIAGNOSIS — I4891 Unspecified atrial fibrillation: Secondary | ICD-10-CM | POA: Diagnosis not present

## 2016-01-23 MED ORDER — METOPROLOL SUCCINATE ER 25 MG PO TB24: 50.0000 mg | ORAL_TABLET | ORAL | Status: DC

## 2016-01-23 NOTE — Patient Instructions (Signed)
Follow up with your cardiologist as suggested

## 2016-01-23 NOTE — Progress Notes (Signed)
Patient came in today with an order for an EKG and BP from Richardson Dopp, PA-C with [facility]. Patient was seen by him on 01/16/16. She was found to be in A Fib and prescribed Eliquis 5mg  BID and Toprol XL 25mg  BID. She complains that since her visit with cardio she has had an increase in fatigue and complains of bilateral leg pain. This is worse when at rest. She questions if these are side effects of her medications.  EKG was done and sent electronically to Richardson Dopp, PA-C for interpreation and signature. BP was 98/70 in left arm with manual cuff. Pulse was 105.  Advised that I will forward this office note to Mr. Kathlen Mody for review and that I expect she will receive a phone call from their office. She is to contact them if she doesn't.

## 2016-01-23 NOTE — Telephone Encounter (Signed)
DPR on file for daughter [name] [name] who has been notified of EKG results that were sent over for review by Brynda Rim. PA. PA d/c Imdur; decrease Lasix to 20 mg daily; increase Toprol to 50 mg AM/25 mg PM. Daughter verbalized understanding to plan of care.

## 2016-01-27 DIAGNOSIS — Z1389 Encounter for screening for other disorder: Secondary | ICD-10-CM | POA: Diagnosis not present

## 2016-01-27 DIAGNOSIS — Z130 Encounter for screening for diseases of the blood and blood-forming organs and certain disorders involving the immune mechanism: Secondary | ICD-10-CM | POA: Diagnosis not present

## 2016-01-27 DIAGNOSIS — L900 Lichen sclerosus et atrophicus: Secondary | ICD-10-CM | POA: Diagnosis not present

## 2016-01-27 DIAGNOSIS — Z01419 Encounter for gynecological examination (general) (routine) without abnormal findings: Secondary | ICD-10-CM | POA: Diagnosis not present

## 2016-01-29 ENCOUNTER — Other Ambulatory Visit: Payer: Self-pay

## 2016-01-29 ENCOUNTER — Other Ambulatory Visit: Payer: Self-pay | Admitting: Physician Assistant

## 2016-01-29 ENCOUNTER — Telehealth: Payer: Self-pay | Admitting: *Deleted

## 2016-01-29 ENCOUNTER — Encounter: Payer: Self-pay | Admitting: Physician Assistant

## 2016-01-29 ENCOUNTER — Ambulatory Visit (HOSPITAL_COMMUNITY): Payer: Medicare HMO | Attending: Cardiovascular Disease

## 2016-01-29 DIAGNOSIS — I340 Nonrheumatic mitral (valve) insufficiency: Secondary | ICD-10-CM | POA: Insufficient documentation

## 2016-01-29 DIAGNOSIS — I10 Essential (primary) hypertension: Secondary | ICD-10-CM | POA: Insufficient documentation

## 2016-01-29 DIAGNOSIS — I4891 Unspecified atrial fibrillation: Secondary | ICD-10-CM | POA: Diagnosis not present

## 2016-01-29 DIAGNOSIS — E785 Hyperlipidemia, unspecified: Secondary | ICD-10-CM | POA: Diagnosis not present

## 2016-01-29 DIAGNOSIS — I351 Nonrheumatic aortic (valve) insufficiency: Secondary | ICD-10-CM | POA: Insufficient documentation

## 2016-01-29 DIAGNOSIS — E119 Type 2 diabetes mellitus without complications: Secondary | ICD-10-CM | POA: Diagnosis not present

## 2016-01-29 DIAGNOSIS — G4733 Obstructive sleep apnea (adult) (pediatric): Secondary | ICD-10-CM | POA: Diagnosis not present

## 2016-01-29 DIAGNOSIS — E669 Obesity, unspecified: Secondary | ICD-10-CM | POA: Insufficient documentation

## 2016-01-29 DIAGNOSIS — Z6831 Body mass index (BMI) 31.0-31.9, adult: Secondary | ICD-10-CM | POA: Insufficient documentation

## 2016-01-29 NOTE — Telephone Encounter (Signed)
Pt needs letter for mailman to bring mail to her door  - done

## 2016-01-30 ENCOUNTER — Telehealth: Payer: Self-pay | Admitting: *Deleted

## 2016-01-30 NOTE — Telephone Encounter (Signed)
DPR on file for daughter [name]. Both pt and daughter notified of echo results by phone. I asked if pt snores or feeling sleepy during the day, they bth answered pt feels tired during the day and yes snores. Possible sleep apnea causing large LA . Daughter stated pt tried t wear CPAP in the past, though was not able to wear. I asked how pt's weight is doing and the pt said her weight is doing well. I did advise to keep appt 2/16 with Richardson Dopp, PA, who may even discuss about possibly a consult with Dr. Radford Pax about sleep apnea. Both the daughter and pt have verbalized understanding to plan of care. I also advised to keep monitoring weight and to call the office [phone] if weight is up 3 lb's x 1 day.

## 2016-02-05 NOTE — Progress Notes (Signed)
Cardiology Office Note:    Date:  02/06/2016   ID:  [name] [name], DOB 09-Sep-1941, MRN [id number]  PCP:  Redge Gainer, MD  Cardiologist:  Dr. Loralie Champagne   Electrophysiologist:  n/a  Chief Complaint  Patient presents with  . Atrial Fibrillation    Follow up    History of Present Illness:     [name] [name] is a 75 y.o. female with a hx of CAD, diastolic HF, HTN, HL, PAF. She has had multiple back surgeries. She has a hx of orthostatic hypotension and multiple falls as well as confusion. She has been kept off of anticoagulation. She had chest pain in 12/15 and Myoview was abnormal. LHC demonstrated complex bifurcational distal LM/ostial LCx disease. She was felt to be a poor CABG candidate due to difficulty with rehab. She underwent bifurcational PCI with DES to the distal LM and ostial LCx. Last seen by Dr. Loralie Champagne 11/16. At that time, it was noted if she had recurrent AFib, anticoagulation would need to be started.   I saw her a few weeks ago.  She was noted to be in AF with RVR when she presented to [facility] for pre-op workup for upcoming colonoscopy. I reviewed her [place] with Dr. Loralie Champagne.  We put her on Eliquis 5 bid and Toprol.  Echo 01/29/16 demonstrated normal LVF, mod LAE, mild MR and PASP 37 mmHg.  TSH was normal.   She returns for FU.  Here with her daughter. She feels weak. She also notes shortness of breath with minimal activity. She denies orthopnea, PND or pedal edema. She denies syncope. She does note atypical chest discomfort. This has been a chronic symptom without significant change.   Past Medical History  Diagnosis Date  . Obesity   . Circadian rhythm sleep disorder   . CTS (carpal tunnel syndrome)   . Diarrhea   . IBS (irritable bowel syndrome)   . Diverticulitis of colon   . Gastritis   . Esophageal stricture   . Personal history of colonic polyps 10/25/2011 & 12/02/11    not retrieved Dr Lyla Son & tubular adenomas  . Hiatal hernia   .  GERD (gastroesophageal reflux disease)   . Insomnia   . HTN (hypertension)   . Hyperlipidemia   . Depression   . Chronic diastolic CHF (congestive heart failure) ([place])   . Memory disorder 12/04/2014  . Pneumonia 03/2014  . Obstructive sleep apnea     "have mask; don't wear it" (01/07/2015)  . Anemia   . History of blood transfusion     "most of them related to OR's"   . MGUS (monoclonal gammopathy of unknown significance) dx'd 11/2014    a. Neg BMB 11/2014.  . Stroke [facility]) early 2000's    "small"; denies residual on 01/07/2015)  . Arthritis     "knees, back, fingers, toes; joints" (01/07/2015)  . Chronic back pain greater than 3 months duration     a. spinal stenosis. Spinal fusion with rods in 2/15 at [facility] spinal fusion 9/15.  Marland Kitchen Anxiety   . CAD (coronary artery disease)     a. Abnl nuc 11/2014. Cath 12/2014 - turned down for CABG. Ultimately s/p TTVP, rotational atherectomy, PTCA and stenting of the ostial LCx and left main into the LAD (crush technique), and IVUS of the LAD/Left main.  Marland Kitchen PAF (paroxysmal atrial fibrillation) ([place])     a.  Paroxysmal. Has only been noted post-op 9/15 spinal fusion. She had cardioversion, not  on anticoagulation. Fall risk, unsteady.  . Sinus bradycardia     a. Baseline HR 50s-60s.  Marland Kitchen PAT (paroxysmal atrial tachycardia) ([place])   . Multiple falls   . Orthostasis   . History of echocardiogram     a. Echo 2/17:  EF 50-55%, trivial AI, midl MR, mod LAE, PASP 37 mmHg  1. Chronic low back pain: spinal stenosis. Spinal fusion with rods in 2/15 at [facility]. Repeat spinal fusion 9/15.  2. Obesity 3. H/o diverticulitis 4. IBS 5. H/o colon polyps 6. GERD: h/o hiatal hernia 7. OSA 8. HTN 9. Hyperlipidemia 10. Depression 11. Cholecystectomy 12. H/o bilateral TKR 13. CAD: LHC (5/09) with 50% ostial LCx, 30-40% mRCA, 30% mLAD. Lexiscan Cardiolite in 11/14 with EF 68%, no ischemia or infarction. Lexiscan Cardiolite (12/15) with EF 57%,  moderate anteroapical and anteroseptal ischemia, inferolateral infarct with peri-infarct ischemia => suggestive of multivessel disease. LHC (1/16) with severe distal left main and ostial LCx stenosis confirmed by FFR. Patient had bifurcation stenting of the distal LM/ostial LCx with rotational atherectomy.  14. PNA in 3/15 15. Atrial fibrillation: Paroxysmal. Has only been noted post-op 9/15 spinal fusion. She had cardioversion, not on anticoagulation.  16. MGUS: Negative bone marrow biopsy 12/15.  17. Anemia  Past Surgical History  Procedure Laterality Date  . Carpal tunnel release Right 1980's  . Total knee arthroplasty Bilateral 1990's - 2000's  . Cataract extraction Bilateral   . Left heart catheterization with coronary angiogram N/A 12/27/2014    Procedure: LEFT HEART CATHETERIZATION WITH CORONARY ANGIOGRAM;  Surgeon: Larey Dresser, MD;  Location: [facility] CATH LAB;  Service: Cardiovascular;  Laterality: N/A;  . Percutaneous coronary rotoblator intervention (pci-r)  01/07/2015  . Cardiac catheterization  12/27/2014    Procedure: INTRAVASCULAR PRESSURE WIRE/FFR STUDY;  Surgeon: Larey Dresser, MD;  Location: [facility] CATH LAB;  Service: Cardiovascular;;  . Coronary angioplasty with stent placement  01/07/2015    "2"  . Laparoscopic cholecystectomy  2003  . Joint replacement    . Knee arthroscopy Left 1995  . Back surgery    . Posterior lumbar fusion  01/2015  . Posterior fusion thoracic spine  08/2015  . Dilation and curettage of uterus    . Cataract extraction w/ intraocular lens  implant, bilateral  2000's  . Esophagogastroduodenoscopy (egd) with esophageal dilation  "several times"  . Bone marrow biopsy  11/2014  . Percutaneous coronary rotoblator intervention (pci-r) N/A 01/07/2015    Procedure: PERCUTANEOUS CORONARY ROTOBLATOR INTERVENTION (PCI-R);  Surgeon: Blane Ohara, MD;  Location: [facility] CATH LAB;  Service: Cardiovascular;  Laterality: N/A;  . Coronary stent placement      Current  Medications: Outpatient Prescriptions Prior to Visit  Medication Sig Dispense Refill  . apixaban (ELIQUIS) 5 MG TABS tablet Take 1 tablet (5 mg total) by mouth 2 (two) times daily. 60 tablet 11  . colesevelam (WELCHOL) 625 MG tablet Take 625 mg by mouth 2 (two) times daily with a meal.     . diphenoxylate-atropine (LOMOTIL) 2.5-0.025 MG per tablet Take 1 tablet by mouth 4 (four) times daily as needed for diarrhea or loose stools.     . Eszopiclone 3 MG TABS Take 1 tablet (3 mg total) by mouth at bedtime. For insomnia,take immediately before bedtime. 90 tablet 1  . fentaNYL (DURAGESIC - DOSED MCG/HR) 50 MCG/HR Place 1 patch (50 mcg total) onto the skin every 3 (three) days. 10 patch 0  . FLUoxetine (PROZAC) 40 MG capsule Take 1 capsule by mouth  daily for depression 90 capsule 3  . furosemide (LASIX) 40 MG tablet Take 20 mg by mouth daily.    Marland Kitchen HYDROcodone-acetaminophen (NORCO) 10-325 MG per tablet Take 1 tablet by mouth every 4 (four) hours as needed for moderate pain.     . iron polysaccharides (NIFEREX) 150 MG capsule Take 1 capsule (150 mg total) by mouth daily. 90 capsule 3  . LORazepam (ATIVAN) 0.5 MG tablet Take 1 tablet (0.5 mg total) by mouth 2 (two) times daily as needed for anxiety. 60 tablet 1  . meclizine (ANTIVERT) 25 MG tablet Take 1 tablet (25 mg total) by mouth 3 (three) times daily as needed for dizziness. 60 tablet 0  . methocarbamol (ROBAXIN) 500 MG tablet Take 1 tablet (500 mg total) by mouth 2 (two) times daily. 20 tablet 0  . metoprolol succinate (TOPROL-XL) 25 MG 24 hr tablet Take 2 tablets (50 mg total) by mouth as directed. 50 mg in the AM; 25 mg PM; Take with or immediately following a meal. 90 tablet 11  . Multiple Vitamin (MULTIVITAMIN) tablet Take 1 tablet by mouth daily. For supplement    . nitroGLYCERIN (NITROSTAT) 0.4 MG SL tablet Place 1 tablet (0.4 mg total) under the tongue every 5 (five) minutes as needed for chest pain (up to 3 doses). 25 tablet 3  .  pantoprazole (PROTONIX) 40 MG tablet Take 1 tablet (40 mg total) by mouth 2 (two) times daily. 180 tablet 3  . rosuvastatin (CRESTOR) 20 MG tablet Take 20 mg by mouth. Patient alternating taking 1 whole tablet (20 mg) by mouth once daily and 1/2 tablet (10 mg ) by mouth once daily the next day.    . Calcium Carbonate-Vit D-Min (CALCIUM 1200) 1200-1000 MG-UNIT CHEW Chew 1 each by mouth daily. Reported on 02/06/2016    . ibuprofen (ADVIL,MOTRIN) 800 MG tablet Take 1 tablet (800 mg total) by mouth 3 (three) times daily. (Patient not taking: Reported on 02/06/2016) 21 tablet 0   No facility-administered medications prior to visit.     Allergies:   Cymbalta; Morphine and related; Oxycodone-acetaminophen; Percocet; Valium; Duloxetine; Altace; Floxin; Hydromorphone; Lipitor; Lovaza; Pravachol; Trilipix; Zetia; and Zocor   Social History   Social History  . Marital Status: Widowed    Spouse Name: N/A  . Number of Children: 2  . Years of Education: HS   Occupational History  . librarian- retired     retired   Social History Main Topics  . Smoking status: Never Smoker   . Smokeless tobacco: Never Used  . Alcohol Use: No     Comment: 01/07/2015 "glass of wine at Christmas, maybe"  . Drug Use: No  . Sexual Activity: No   Other Topics Concern  . None   Social History Narrative   Patient is right handed   Patient drinks 1-2 sodas daily.   patlient lives alone.        Family History:  The patient's family history includes Colon cancer in her sister and sister; Coronary artery disease in her brother, father, and mother; Emphysema in her brother and sister; Peripheral vascular disease in her father; Sleep apnea in her son. There is no history of Dementia.   ROS:   Please see the history of present illness.    Review of Systems  Constitution: Positive for malaise/fatigue.  Cardiovascular: Positive for chest pain and dyspnea on exertion.  Musculoskeletal: Positive for back pain and myalgias.   Neurological: Positive for loss of balance.  All other systems reviewed  and are negative.   Physical Exam:    VS:  BP 110/70 mmHg  Pulse 100  Ht 5' 7"  (1.702 m)  Wt 196 lb (88.905 kg)  BMI 30.69 kg/m2  SpO2 98%   GEN: Well nourished, well developed, in no acute distress HEENT: normal Neck: no JVD, no masses Cardiac: Normal S1/S2, irregularly irregular rhythm; no murmur,  no edema;    Respiratory:  clear to auscultation bilaterally; no wheezing, rhonchi or rales GI: soft, nontender  MS: no deformity or atrophy Skin: warm and dry,  Neuro:  no focal deficits  Psych: Alert and oriented x 3, normal affect  Wt Readings from Last 3 Encounters:  02/06/16 196 lb (88.905 kg)  01/16/16 199 lb (90.266 kg)  01/02/16 199 lb 9.6 oz (90.538 kg)      Studies/Labs Reviewed:     EKG:  EKG is  ordered today.  The ekg ordered today demonstrates atrial fibrillation, HR 97  Recent Labs: 08/15/2015: BNP 43.7 01/02/2016: ALT 15 01/16/2016: BUN 11; Creat 0.72; Hemoglobin 13.0; Platelets 197; Potassium 4.4; Sodium 138; TSH 1.690   Recent Lipid Panel    Component Value Date/Time   CHOL 151 10/11/2015 1431   CHOL 158 01/03/2015 1310   CHOL 156 05/11/2013 1536   TRIG 189* 10/11/2015 1431   TRIG 171* 01/03/2015 1310   TRIG 239* 05/11/2013 1536   HDL 56 10/11/2015 1431   HDL 61 01/03/2015 1310   HDL 43 05/11/2013 1536   CHOLHDL 2.6 01/03/2015 1310   LDLCALC 63 01/03/2015 1310   LDLCALC 50 08/21/2014 1141   LDLCALC 65 05/11/2013 1536    Additional studies/ records that were reviewed today include:   Echo 01/29/16 EF 50-55%, trivial AI, mild MR, mod LAE, PASP 37 mmHg  LHC 1/16 LM dist ? 50% LAD prox 30% - FFR 0.82 LCx ostial 70% - FFR 0.86 >> 0.66 RCA mid 40% CABG deferred b/c of poor functional capacity PCI: Complex bifurcational distal LM and ostial LCx with rotational atherectomy and IVUS guided with 2.75 x 12 mm Synergy DES to ostial LCx; 4 x 16 mm Synergy DES to distal  LM  Myoview 12/15 Intermediate risk stress nuclear study Moderate area of anteroapical and anteroseptal ischemia Also inferolateral wall infarct with moderate peri infarct ischemia Study suggests multi vessel diseae. LV Ejection Fraction: 57%  Echo 7/11 Mild LVH, EF 55-60%, no RWMA, mild MR   ASSESSMENT:     1. Persistent atrial fibrillation ([place])   2. Coronary artery disease involving native coronary artery of native heart without angina pectoris   3. Chronic diastolic CHF (congestive heart failure) ([place])   4. Essential hypertension   5. Hyperlipidemia     PLAN:     In order of problems listed above:  1. Persistent AFib - She remains in atrial fibrillation. I suspect that she is symptomatic with fatigue and dyspnea. Heart rate is better controlled. She has been on Eliquis for 3 weeks. I will arrange cardioversion next week. Change Toprol-XL 25 mg to twice a day on the day of her cardioversion. She will need to remain on long-term anticoagulation.  Follow-up one week after cardioversion.   2. CAD - She has atypical chest pain. She is no longer on ASA or Plavix as she is on Eliquis. Continue statin.   3. Chronic Diastolic CHF - Volume appears stable.   4. HTN - Controlled.   5. HL- continue statin.   6. Mild Pulmonary HTN - She has a hx of OSA  but was intol to CPAP.  Consider referral to Dr. Fransico Him in the future.    Medication Adjustments/Labs and Tests Ordered: Current medicines are reviewed at length with the patient today.  Concerns regarding medicines are outlined above.  Medication changes, Labs and Tests ordered today are outlined in the Patient Instructions noted below. Patient Instructions  Medication Instructions:  1. STARTING AS OF 02/13/16 THE MORNING OF YOUR CARDIOVERSION; YOU WILL DECREASE TOPROL XL TO 25 MG TWICE DAILY   Labwork: TODAY BMET  Testing/Procedures: Your physician has recommended that you have a Cardioversion (DCCV). Electrical  Cardioversion uses a jolt of electricity to your heart either through paddles or wired patches attached to your chest. This is a controlled, usually prescheduled, procedure. Defibrillation is done under light anesthesia in the hospital, and you usually go home the day of the procedure. This is done to get your heart back into a normal rhythm. You are not awake for the procedure. Please see the instruction sheet given to you today.  Follow-Up: 02/20/16 @ 2:20 WITH Milbern Doescher, PAC   Any Other Special Instructions Will Be Listed Below (If Applicable).  If you need a refill on your cardiac medications before your next appointment, please call your pharmacy.   Signed, Richardson Dopp, PA-C  02/06/2016 7:32 PM    [facility] [address] [place], [place], [place]  [address] Phone: [phone]; Fax: [phone]

## 2016-02-06 ENCOUNTER — Encounter: Payer: Self-pay | Admitting: *Deleted

## 2016-02-06 ENCOUNTER — Encounter: Payer: Self-pay | Admitting: Physician Assistant

## 2016-02-06 ENCOUNTER — Ambulatory Visit (INDEPENDENT_AMBULATORY_CARE_PROVIDER_SITE_OTHER): Payer: Medicare HMO | Admitting: Physician Assistant

## 2016-02-06 VITALS — BP 110/70 | HR 100 | Ht 67.0 in | Wt 196.0 lb

## 2016-02-06 DIAGNOSIS — I10 Essential (primary) hypertension: Secondary | ICD-10-CM | POA: Diagnosis not present

## 2016-02-06 DIAGNOSIS — I4819 Other persistent atrial fibrillation: Secondary | ICD-10-CM

## 2016-02-06 DIAGNOSIS — I4891 Unspecified atrial fibrillation: Secondary | ICD-10-CM

## 2016-02-06 DIAGNOSIS — I481 Persistent atrial fibrillation: Secondary | ICD-10-CM

## 2016-02-06 DIAGNOSIS — I5032 Chronic diastolic (congestive) heart failure: Secondary | ICD-10-CM

## 2016-02-06 DIAGNOSIS — E785 Hyperlipidemia, unspecified: Secondary | ICD-10-CM

## 2016-02-06 DIAGNOSIS — I509 Heart failure, unspecified: Secondary | ICD-10-CM | POA: Diagnosis not present

## 2016-02-06 DIAGNOSIS — I2510 Atherosclerotic heart disease of native coronary artery without angina pectoris: Secondary | ICD-10-CM | POA: Diagnosis not present

## 2016-02-06 LAB — BASIC METABOLIC PANEL
BUN: 17 mg/dL (ref 7–25)
CALCIUM: 9.2 mg/dL (ref 8.6–10.4)
CO2: 30 mmol/L (ref 20–31)
CREATININE: 0.87 mg/dL (ref 0.60–0.93)
Chloride: 102 mmol/L (ref 98–110)
GLUCOSE: 106 mg/dL — AB (ref 65–99)
Potassium: 4.5 mmol/L (ref 3.5–5.3)
Sodium: 137 mmol/L (ref 135–146)

## 2016-02-06 NOTE — Patient Instructions (Addendum)
Medication Instructions:  1. STARTING AS OF 02/13/16 THE MORNING OF YOUR CARDIOVERSION; YOU WILL DECREASE TOPROL XL TO 25 MG TWICE DAILY   Labwork: TODAY BMET  Testing/Procedures: Your physician has recommended that you have a Cardioversion (DCCV). Electrical Cardioversion uses a jolt of electricity to your heart either through paddles or wired patches attached to your chest. This is a controlled, usually prescheduled, procedure. Defibrillation is done under light anesthesia in the hospital, and you usually go home the day of the procedure. This is done to get your heart back into a normal rhythm. You are not awake for the procedure. Please see the instruction sheet given to you today.  Follow-Up: 02/20/16 @ 2:20 WITH SCOTT WEAVER, PAC   Any Other Special Instructions Will Be Listed Below (If Applicable).  If you need a refill on your cardiac medications before your next appointment, please call your pharmacy.

## 2016-02-13 ENCOUNTER — Encounter (HOSPITAL_COMMUNITY)
Admission: RE | Disposition: A | Discharge status: Home / Self Care | Payer: Self-pay | Source: Ambulatory Visit | Attending: Cardiovascular Disease

## 2016-02-13 ENCOUNTER — Ambulatory Visit (HOSPITAL_COMMUNITY)
Admission: RE | Admit: 2016-02-13 | Discharge: 2016-02-13 | Disposition: A | Discharge status: Home / Self Care | Payer: Medicare HMO | Source: Ambulatory Visit | Attending: Cardiovascular Disease | Admitting: Cardiovascular Disease

## 2016-02-13 ENCOUNTER — Ambulatory Visit (HOSPITAL_COMMUNITY): Payer: Medicare HMO | Admitting: Certified Registered Nurse Anesthetist

## 2016-02-13 ENCOUNTER — Encounter (HOSPITAL_COMMUNITY): Payer: Self-pay

## 2016-02-13 DIAGNOSIS — Z981 Arthrodesis status: Secondary | ICD-10-CM | POA: Insufficient documentation

## 2016-02-13 DIAGNOSIS — Z7901 Long term (current) use of anticoagulants: Secondary | ICD-10-CM | POA: Diagnosis not present

## 2016-02-13 DIAGNOSIS — K589 Irritable bowel syndrome without diarrhea: Secondary | ICD-10-CM | POA: Insufficient documentation

## 2016-02-13 DIAGNOSIS — I480 Paroxysmal atrial fibrillation: Secondary | ICD-10-CM | POA: Diagnosis not present

## 2016-02-13 DIAGNOSIS — I481 Persistent atrial fibrillation: Secondary | ICD-10-CM | POA: Insufficient documentation

## 2016-02-13 DIAGNOSIS — Z8673 Personal history of transient ischemic attack (TIA), and cerebral infarction without residual deficits: Secondary | ICD-10-CM | POA: Diagnosis not present

## 2016-02-13 DIAGNOSIS — R296 Repeated falls: Secondary | ICD-10-CM | POA: Insufficient documentation

## 2016-02-13 DIAGNOSIS — Z79899 Other long term (current) drug therapy: Secondary | ICD-10-CM | POA: Diagnosis not present

## 2016-02-13 DIAGNOSIS — I272 Other secondary pulmonary hypertension: Secondary | ICD-10-CM | POA: Diagnosis not present

## 2016-02-13 DIAGNOSIS — I471 Supraventricular tachycardia: Secondary | ICD-10-CM | POA: Insufficient documentation

## 2016-02-13 DIAGNOSIS — M159 Polyosteoarthritis, unspecified: Secondary | ICD-10-CM | POA: Insufficient documentation

## 2016-02-13 DIAGNOSIS — G8929 Other chronic pain: Secondary | ICD-10-CM | POA: Diagnosis not present

## 2016-02-13 DIAGNOSIS — I110 Hypertensive heart disease with heart failure: Secondary | ICD-10-CM | POA: Insufficient documentation

## 2016-02-13 DIAGNOSIS — Z96653 Presence of artificial knee joint, bilateral: Secondary | ICD-10-CM | POA: Diagnosis not present

## 2016-02-13 DIAGNOSIS — F329 Major depressive disorder, single episode, unspecified: Secondary | ICD-10-CM | POA: Diagnosis not present

## 2016-02-13 DIAGNOSIS — I509 Heart failure, unspecified: Secondary | ICD-10-CM | POA: Diagnosis not present

## 2016-02-13 DIAGNOSIS — K219 Gastro-esophageal reflux disease without esophagitis: Secondary | ICD-10-CM | POA: Diagnosis not present

## 2016-02-13 DIAGNOSIS — I4819 Other persistent atrial fibrillation: Secondary | ICD-10-CM

## 2016-02-13 DIAGNOSIS — E785 Hyperlipidemia, unspecified: Secondary | ICD-10-CM | POA: Diagnosis not present

## 2016-02-13 DIAGNOSIS — G4733 Obstructive sleep apnea (adult) (pediatric): Secondary | ICD-10-CM | POA: Diagnosis not present

## 2016-02-13 DIAGNOSIS — Z6830 Body mass index (BMI) 30.0-30.9, adult: Secondary | ICD-10-CM | POA: Diagnosis not present

## 2016-02-13 DIAGNOSIS — I5032 Chronic diastolic (congestive) heart failure: Secondary | ICD-10-CM | POA: Insufficient documentation

## 2016-02-13 DIAGNOSIS — I2510 Atherosclerotic heart disease of native coronary artery without angina pectoris: Secondary | ICD-10-CM | POA: Diagnosis not present

## 2016-02-13 DIAGNOSIS — E669 Obesity, unspecified: Secondary | ICD-10-CM | POA: Diagnosis not present

## 2016-02-13 DIAGNOSIS — M549 Dorsalgia, unspecified: Secondary | ICD-10-CM | POA: Insufficient documentation

## 2016-02-13 DIAGNOSIS — I4891 Unspecified atrial fibrillation: Secondary | ICD-10-CM | POA: Diagnosis not present

## 2016-02-13 DIAGNOSIS — I10 Essential (primary) hypertension: Secondary | ICD-10-CM | POA: Diagnosis not present

## 2016-02-13 HISTORY — PX: CARDIOVERSION: SHX1299

## 2016-02-13 SURGERY — CARDIOVERSION
Anesthesia: General

## 2016-02-13 MED ORDER — LIDOCAINE HCL (CARDIAC) 20 MG/ML IV SOLN
INTRAVENOUS | Status: DC | PRN
  Administered 2016-02-13: 60 mg via INTRATRACHEAL

## 2016-02-13 MED ORDER — SODIUM CHLORIDE 0.9 % IV SOLN
250.0000 mL | INTRAVENOUS | Status: DC
  Administered 2016-02-13: 500 mL via INTRAVENOUS
  Administered 2016-02-13: 08:00:00 via INTRAVENOUS

## 2016-02-13 MED ORDER — HYDROCORTISONE 1 % EX CREA: 1.0000 "application " | TOPICAL_CREAM | Freq: Three times a day (TID) | CUTANEOUS | Status: DC | PRN

## 2016-02-13 MED ORDER — EPHEDRINE SULFATE 50 MG/ML IJ SOLN
INTRAMUSCULAR | Status: DC | PRN
  Administered 2016-02-13: 5 mg via INTRAVENOUS

## 2016-02-13 MED ORDER — SODIUM CHLORIDE 0.9% FLUSH: 3.0000 mL | INTRAVENOUS | Status: DC | PRN

## 2016-02-13 MED ORDER — PROPOFOL 10 MG/ML IV BOLUS
INTRAVENOUS | Status: DC | PRN
  Administered 2016-02-13: 60 mg via INTRAVENOUS

## 2016-02-13 MED ORDER — SODIUM CHLORIDE 0.9% FLUSH: 3.0000 mL | Freq: Two times a day (BID) | INTRAVENOUS | Status: DC

## 2016-02-13 NOTE — Anesthesia Preprocedure Evaluation (Signed)
Anesthesia Evaluation  Patient identified by MRN, date of birth, ID band Patient awake    Reviewed: Allergy & Precautions, NPO status , Patient's Chart, lab work & pertinent test results  Airway Mallampati: II  TM Distance: >3 FB Neck ROM: Full    Dental no notable dental hx.    Pulmonary sleep apnea ,    Pulmonary exam normal breath sounds clear to auscultation       Cardiovascular hypertension, Pt. on medications +CHF  Normal cardiovascular exam+ dysrhythmias Atrial Fibrillation  Rhythm:Irregular Rate:Normal     Neuro/Psych negative neurological ROS  negative psych ROS   GI/Hepatic negative GI ROS, Neg liver ROS,   Endo/Other  negative endocrine ROS  Renal/GU negative Renal ROS  negative genitourinary   Musculoskeletal negative musculoskeletal ROS (+)   Abdominal   Peds negative pediatric ROS (+)  Hematology negative hematology ROS (+)   Anesthesia Other Findings   Reproductive/Obstetrics negative OB ROS                             Anesthesia Physical Anesthesia Plan  ASA: III  Anesthesia Plan: General   Post-op Pain Management:    Induction: Intravenous  Airway Management Planned: Mask  Additional Equipment:   Intra-op Plan:   Post-operative Plan:   Informed Consent: I have reviewed the patients History and Physical, chart, labs and discussed the procedure including the risks, benefits and alternatives for the proposed anesthesia with the patient or authorized representative who has indicated his/her understanding and acceptance.   Dental advisory given  Plan Discussed with: CRNA and Surgeon  Anesthesia Plan Comments:         Anesthesia Quick Evaluation

## 2016-02-13 NOTE — CV Procedure (Signed)
Anesthesia:  Dr Kalman Shan 60 mg propofol 60 mg lidocaine On Rx Eliquis NPO  Muse x1 12J sync biphasic Converted from afib rate 115 to NSR rate 70  No immediate neurologic sequelae  Jenkins Rouge

## 2016-02-13 NOTE — Interval H&P Note (Signed)
History and Physical Interval Note:  02/13/2016 7:39 AM  [name] [name]  has presented today for surgery, with the diagnosis of AFIB  The various methods of treatment have been discussed with the patient and family. After consideration of risks, benefits and other options for treatment, the patient has consented to  Procedure(s): CARDIOVERSION (N/A) as a surgical intervention .  The patient's history has been reviewed, patient examined, no change in status, stable for surgery.  I have reviewed the patient's chart and labs.  Questions were answered to the patient's satisfaction.     Jenkins Rouge

## 2016-02-13 NOTE — Transfer of Care (Signed)
Immediate Anesthesia Transfer of Care Note  Patient: [name] [name]  Procedure(s) Performed: Procedure(s): CARDIOVERSION (N/A)  Patient Location: PACU  Anesthesia Type:MAC  Level of Consciousness: awake and lethargic  Airway & Oxygen Therapy: Patient Spontanous Breathing  Post-op Assessment: Report given to RN and Post -op Vital signs reviewed and stable  Post vital signs: stable  Last Vitals:  Filed Vitals:   02/13/16 0739  BP: 129/87  Pulse: 104  Resp: 12    Complications: No apparent anesthesia complications

## 2016-02-13 NOTE — Anesthesia Postprocedure Evaluation (Signed)
Anesthesia Post Note  Patient: [name] [name]  Procedure(s) Performed: Procedure(s) (LRB): CARDIOVERSION (N/A)  Patient location during evaluation: PACU Anesthesia Type: General Level of consciousness: awake and alert Pain management: pain level controlled Vital Signs Assessment: post-procedure vital signs reviewed and stable Respiratory status: spontaneous breathing, nonlabored ventilation, respiratory function stable and patient connected to nasal cannula oxygen Cardiovascular status: blood pressure returned to baseline and stable Postop Assessment: no signs of nausea or vomiting Anesthetic complications: no    Last Vitals:  Filed Vitals:   02/13/16 0837 02/13/16 0840  BP: 107/52 107/52  Pulse: 50 49  Resp: 14 11    Last Pain: There were no vitals filed for this visit.               Lulu Hirschmann S

## 2016-02-13 NOTE — Discharge Instructions (Signed)
Monitored Anesthesia Care °Monitored anesthesia care is an anesthesia service for a medical procedure. Anesthesia is the loss of the ability to feel pain. It is produced by medicines called anesthetics. It may affect a small area of your body (local anesthesia), a large area of your body (regional anesthesia), or your entire body (general anesthesia). The need for monitored anesthesia care depends your procedure, your condition, and the potential need for regional or general anesthesia. It is often provided during procedures where:  °· General anesthesia may be needed if there are complications. This is because you need special care when you are under general anesthesia.   °· You will be under local or regional anesthesia. This is so that you are able to have higher levels of anesthesia if needed.   °· You will receive calming medicines (sedatives). This is especially the [place] if sedatives are given to put you in a semi-conscious state of relaxation (deep sedation). This is because the amount of sedative needed to produce this state can be hard to predict. Too much of a sedative can produce general anesthesia. °Monitored anesthesia care is performed by one or more health care providers who have special training in all types of anesthesia. You will need to meet with these health care providers before your procedure. During this meeting, they will ask you about your medical history. They will also give you instructions to follow. (For example, you will need to stop eating and drinking before your procedure. You may also need to stop or change medicines you are taking.) During your procedure, your health care providers will stay with you. They will:  °· Watch your condition. This includes watching your blood pressure, breathing, and level of pain.   °· Diagnose and treat problems that occur.   °· Give medicines if they are needed. These may include calming medicines (sedatives) and anesthetics.   °· Make sure you are  comfortable.   °Having monitored anesthesia care does not necessarily mean that you will be under anesthesia. It does mean that your health care providers will be able to manage anesthesia if you need it or if it occurs. It also means that you will be able to have a different type of anesthesia than you are having if you need it. When your procedure is complete, your health care providers will continue to watch your condition. They will make sure any medicines wear off before you are allowed to go home.  °  °This information is not intended to replace advice given to you by your health care provider. Make sure you discuss any questions you have with your health care provider. °  °Document Released: 09/02/2005 Document Revised: 12/28/2014 Document Reviewed: 01/18/2013 °Elsevier Interactive Patient Education ©2016 Elsevier Inc. °Electrical Cardioversion, Care After °Refer to this sheet in the next few weeks. These instructions provide you with information on caring for yourself after your procedure. Your health care provider may also give you more specific instructions. Your treatment has been planned according to current medical practices, but problems sometimes occur. Call your health care provider if you have any problems or questions after your procedure. °WHAT TO EXPECT AFTER THE PROCEDURE °After your procedure, it is typical to have the following sensations: °· Some redness on the skin where the shocks were delivered. If this is tender, a sunburn lotion or hydrocortisone cream may help. °· Possible return of an abnormal heart rhythm within hours or days after the procedure. °HOME CARE INSTRUCTIONS °· Take medicines only as directed by your health care provider.   Be sure you understand how and when to take your medicine. °· Learn how to feel your pulse and check it often. °· Limit your activity for 48 hours after the procedure or as directed by your health care provider. °· Avoid or minimize caffeine and other  stimulants as directed by your health care provider. °SEEK MEDICAL CARE IF: °· You feel like your heart is beating too fast or your pulse is not regular. °· You have any questions about your medicines. °· You have bleeding that will not stop. °SEEK IMMEDIATE MEDICAL CARE IF: °· You are dizzy or feel faint. °· It is hard to breathe or you feel short of breath. °· There is a change in discomfort in your chest. °· Your speech is slurred or you have trouble moving an arm or leg on one side of your body. °· You get a serious muscle cramp that does not go away. °· Your fingers or toes turn cold or blue. °  °This information is not intended to replace advice given to you by your health care provider. Make sure you discuss any questions you have with your health care provider. °  °Document Released: 09/27/2013 Document Revised: 12/28/2014 Document Reviewed: 09/27/2013 °Elsevier Interactive Patient Education ©2016 Elsevier Inc. ° °

## 2016-02-13 NOTE — H&P (View-Only) (Signed)
Cardiology Office Note:    Date:  02/06/2016   ID:  [name] [name], DOB August 28, 1941, MRN [id number]  PCP:  Redge Gainer, MD  Cardiologist:  Dr. Loralie Champagne   Electrophysiologist:  n/a  Chief Complaint  Patient presents with  . Atrial Fibrillation    Follow up    History of Present Illness:     [name] [name] is a 75 y.o. female with a hx of CAD, diastolic HF, HTN, HL, PAF. She has had multiple back surgeries. She has a hx of orthostatic hypotension and multiple falls as well as confusion. She has been kept off of anticoagulation. She had chest pain in 12/15 and Myoview was abnormal. LHC demonstrated complex bifurcational distal LM/ostial LCx disease. She was felt to be a poor CABG candidate due to difficulty with rehab. She underwent bifurcational PCI with DES to the distal LM and ostial LCx. Last seen by Dr. Loralie Champagne 11/16. At that time, it was noted if she had recurrent AFib, anticoagulation would need to be started.   I saw her a few weeks ago.  She was noted to be in AF with RVR when she presented to [facility] for pre-op workup for upcoming colonoscopy. I reviewed her [place] with Dr. Loralie Champagne.  We put her on Eliquis 5 bid and Toprol.  Echo 01/29/16 demonstrated normal LVF, mod LAE, mild MR and PASP 37 mmHg.  TSH was normal.   She returns for FU.  Here with her daughter. She feels weak. She also notes shortness of breath with minimal activity. She denies orthopnea, PND or pedal edema. She denies syncope. She does note atypical chest discomfort. This has been a chronic symptom without significant change.   Past Medical History  Diagnosis Date  . Obesity   . Circadian rhythm sleep disorder   . CTS (carpal tunnel syndrome)   . Diarrhea   . IBS (irritable bowel syndrome)   . Diverticulitis of colon   . Gastritis   . Esophageal stricture   . Personal history of colonic polyps 10/25/2011 & 12/02/11    not retrieved Dr Lyla Son & tubular adenomas  . Hiatal hernia   .  GERD (gastroesophageal reflux disease)   . Insomnia   . HTN (hypertension)   . Hyperlipidemia   . Depression   . Chronic diastolic CHF (congestive heart failure) ([place])   . Memory disorder 12/04/2014  . Pneumonia 03/2014  . Obstructive sleep apnea     "have mask; don't wear it" (01/07/2015)  . Anemia   . History of blood transfusion     "most of them related to OR's"   . MGUS (monoclonal gammopathy of unknown significance) dx'd 11/2014    a. Neg BMB 11/2014.  . Stroke [facility]) early 2000's    "small"; denies residual on 01/07/2015)  . Arthritis     "knees, back, fingers, toes; joints" (01/07/2015)  . Chronic back pain greater than 3 months duration     a. spinal stenosis. Spinal fusion with rods in 2/15 at [facility] spinal fusion 9/15.  Marland Kitchen Anxiety   . CAD (coronary artery disease)     a. Abnl nuc 11/2014. Cath 12/2014 - turned down for CABG. Ultimately s/p TTVP, rotational atherectomy, PTCA and stenting of the ostial LCx and left main into the LAD (crush technique), and IVUS of the LAD/Left main.  Marland Kitchen PAF (paroxysmal atrial fibrillation) ([place])     a.  Paroxysmal. Has only been noted post-op 9/15 spinal fusion. She had cardioversion, not  on anticoagulation. Fall risk, unsteady.  . Sinus bradycardia     a. Baseline HR 50s-60s.  Marland Kitchen PAT (paroxysmal atrial tachycardia) ([place])   . Multiple falls   . Orthostasis   . History of echocardiogram     a. Echo 2/17:  EF 50-55%, trivial AI, midl MR, mod LAE, PASP 37 mmHg  1. Chronic low back pain: spinal stenosis. Spinal fusion with rods in 2/15 at [facility]. Repeat spinal fusion 9/15.  2. Obesity 3. H/o diverticulitis 4. IBS 5. H/o colon polyps 6. GERD: h/o hiatal hernia 7. OSA 8. HTN 9. Hyperlipidemia 10. Depression 11. Cholecystectomy 12. H/o bilateral TKR 13. CAD: LHC (5/09) with 50% ostial LCx, 30-40% mRCA, 30% mLAD. Lexiscan Cardiolite in 11/14 with EF 68%, no ischemia or infarction. Lexiscan Cardiolite (12/15) with EF 57%,  moderate anteroapical and anteroseptal ischemia, inferolateral infarct with peri-infarct ischemia => suggestive of multivessel disease. LHC (1/16) with severe distal left main and ostial LCx stenosis confirmed by FFR. Patient had bifurcation stenting of the distal LM/ostial LCx with rotational atherectomy.  14. PNA in 3/15 15. Atrial fibrillation: Paroxysmal. Has only been noted post-op 9/15 spinal fusion. She had cardioversion, not on anticoagulation.  16. MGUS: Negative bone marrow biopsy 12/15.  17. Anemia  Past Surgical History  Procedure Laterality Date  . Carpal tunnel release Right 1980's  . Total knee arthroplasty Bilateral 1990's - 2000's  . Cataract extraction Bilateral   . Left heart catheterization with coronary angiogram N/A 12/27/2014    Procedure: LEFT HEART CATHETERIZATION WITH CORONARY ANGIOGRAM;  Surgeon: Larey Dresser, MD;  Location[facility] CATH LAB;  Service: Cardiovascular;  Laterality: N/A;  . Percutaneous coronary rotoblator intervention (pci-r)  01/07/2015  . Cardiac catheterization  12/27/2014    Procedure: INTRAVASCULAR PRESSURE WIRE/FFR STUDY;  Surgeon: Larey Dresser, MD;  Location: [facility] CATH LAB;  Service: Cardiovascular;;  . Coronary angioplasty with stent placement  01/07/2015    "2"  . Laparoscopic cholecystectomy  2003  . Joint replacement    . Knee arthroscopy Left 1995  . Back surgery    . Posterior lumbar fusion  01/2015  . Posterior fusion thoracic spine  08/2015  . Dilation and curettage of uterus    . Cataract extraction w/ intraocular lens  implant, bilateral  2000's  . Esophagogastroduodenoscopy (egd) with esophageal dilation  "several times"  . Bone marrow biopsy  11/2014  . Percutaneous coronary rotoblator intervention (pci-r) N/A 01/07/2015    Procedure: PERCUTANEOUS CORONARY ROTOBLATOR INTERVENTION (PCI-R);  Surgeon: Blane Ohara, MD;  Location: [facility] CATH LAB;  Service: Cardiovascular;  Laterality: N/A;  . Coronary stent placement      Current  Medications: Outpatient Prescriptions Prior to Visit  Medication Sig Dispense Refill  . apixaban (ELIQUIS) 5 MG TABS tablet Take 1 tablet (5 mg total) by mouth 2 (two) times daily. 60 tablet 11  . colesevelam (WELCHOL) 625 MG tablet Take 625 mg by mouth 2 (two) times daily with a meal.     . diphenoxylate-atropine (LOMOTIL) 2.5-0.025 MG per tablet Take 1 tablet by mouth 4 (four) times daily as needed for diarrhea or loose stools.     . Eszopiclone 3 MG TABS Take 1 tablet (3 mg total) by mouth at bedtime. For insomnia,take immediately before bedtime. 90 tablet 1  . fentaNYL (DURAGESIC - DOSED MCG/HR) 50 MCG/HR Place 1 patch (50 mcg total) onto the skin every 3 (three) days. 10 patch 0  . FLUoxetine (PROZAC) 40 MG capsule Take 1 capsule by mouth  daily for depression 90 capsule 3  . furosemide (LASIX) 40 MG tablet Take 20 mg by mouth daily.    Marland Kitchen HYDROcodone-acetaminophen (NORCO) 10-325 MG per tablet Take 1 tablet by mouth every 4 (four) hours as needed for moderate pain.     . iron polysaccharides (NIFEREX) 150 MG capsule Take 1 capsule (150 mg total) by mouth daily. 90 capsule 3  . LORazepam (ATIVAN) 0.5 MG tablet Take 1 tablet (0.5 mg total) by mouth 2 (two) times daily as needed for anxiety. 60 tablet 1  . meclizine (ANTIVERT) 25 MG tablet Take 1 tablet (25 mg total) by mouth 3 (three) times daily as needed for dizziness. 60 tablet 0  . methocarbamol (ROBAXIN) 500 MG tablet Take 1 tablet (500 mg total) by mouth 2 (two) times daily. 20 tablet 0  . metoprolol succinate (TOPROL-XL) 25 MG 24 hr tablet Take 2 tablets (50 mg total) by mouth as directed. 50 mg in the AM; 25 mg PM; Take with or immediately following a meal. 90 tablet 11  . Multiple Vitamin (MULTIVITAMIN) tablet Take 1 tablet by mouth daily. For supplement    . nitroGLYCERIN (NITROSTAT) 0.4 MG SL tablet Place 1 tablet (0.4 mg total) under the tongue every 5 (five) minutes as needed for chest pain (up to 3 doses). 25 tablet 3  .  pantoprazole (PROTONIX) 40 MG tablet Take 1 tablet (40 mg total) by mouth 2 (two) times daily. 180 tablet 3  . rosuvastatin (CRESTOR) 20 MG tablet Take 20 mg by mouth. Patient alternating taking 1 whole tablet (20 mg) by mouth once daily and 1/2 tablet (10 mg ) by mouth once daily the next day.    . Calcium Carbonate-Vit D-Min (CALCIUM 1200) 1200-1000 MG-UNIT CHEW Chew 1 each by mouth daily. Reported on 02/06/2016    . ibuprofen (ADVIL,MOTRIN) 800 MG tablet Take 1 tablet (800 mg total) by mouth 3 (three) times daily. (Patient not taking: Reported on 02/06/2016) 21 tablet 0   No facility-administered medications prior to visit.     Allergies:   Cymbalta; Morphine and related; Oxycodone-acetaminophen; Percocet; Valium; Duloxetine; Altace; Floxin; Hydromorphone; Lipitor; Lovaza; Pravachol; Trilipix; Zetia; and Zocor   Social History   Social History  . Marital Status: Widowed    Spouse Name: N/A  . Number of Children: 2  . Years of Education: HS   Occupational History  . librarian- retired     retired   Social History Main Topics  . Smoking status: Never Smoker   . Smokeless tobacco: Never Used  . Alcohol Use: No     Comment: 01/07/2015 "glass of wine at Christmas, maybe"  . Drug Use: No  . Sexual Activity: No   Other Topics Concern  . None   Social History Narrative   Patient is right handed   Patient drinks 1-2 sodas daily.   patlient lives alone.        Family History:  The patient's family history includes Colon cancer in her sister and sister; Coronary artery disease in her brother, father, and mother; Emphysema in her brother and sister; Peripheral vascular disease in her father; Sleep apnea in her son. There is no history of Dementia.   ROS:   Please see the history of present illness.    Review of Systems  Constitution: Positive for malaise/fatigue.  Cardiovascular: Positive for chest pain and dyspnea on exertion.  Musculoskeletal: Positive for back pain and myalgias.   Neurological: Positive for loss of balance.  All other systems reviewed  and are negative.   Physical Exam:    VS:  BP 110/70 mmHg  Pulse 100  Ht 5' 7"  (1.702 m)  Wt 196 lb (88.905 kg)  BMI 30.69 kg/m2  SpO2 98%   GEN: Well nourished, well developed, in no acute distress HEENT: normal Neck: no JVD, no masses Cardiac: Normal S1/S2, irregularly irregular rhythm; no murmur,  no edema;    Respiratory:  clear to auscultation bilaterally; no wheezing, rhonchi or rales GI: soft, nontender  MS: no deformity or atrophy Skin: warm and dry,  Neuro:  no focal deficits  Psych: Alert and oriented x 3, normal affect  Wt Readings from Last 3 Encounters:  02/06/16 196 lb (88.905 kg)  01/16/16 199 lb (90.266 kg)  01/02/16 199 lb 9.6 oz (90.538 kg)      Studies/Labs Reviewed:     EKG:  EKG is  ordered today.  The ekg ordered today demonstrates atrial fibrillation, HR 97  Recent Labs: 08/15/2015: BNP 43.7 01/02/2016: ALT 15 01/16/2016: BUN 11; Creat 0.72; Hemoglobin 13.0; Platelets 197; Potassium 4.4; Sodium 138; TSH 1.690   Recent Lipid Panel    Component Value Date/Time   CHOL 151 10/11/2015 1431   CHOL 158 01/03/2015 1310   CHOL 156 05/11/2013 1536   TRIG 189* 10/11/2015 1431   TRIG 171* 01/03/2015 1310   TRIG 239* 05/11/2013 1536   HDL 56 10/11/2015 1431   HDL 61 01/03/2015 1310   HDL 43 05/11/2013 1536   CHOLHDL 2.6 01/03/2015 1310   LDLCALC 63 01/03/2015 1310   LDLCALC 50 08/21/2014 1141   LDLCALC 65 05/11/2013 1536    Additional studies/ records that were reviewed today include:   Echo 01/29/16 EF 50-55%, trivial AI, mild MR, mod LAE, PASP 37 mmHg  LHC 1/16 LM dist ? 50% LAD prox 30% - FFR 0.82 LCx ostial 70% - FFR 0.86 >> 0.66 RCA mid 40% CABG deferred b/c of poor functional capacity PCI: Complex bifurcational distal LM and ostial LCx with rotational atherectomy and IVUS guided with 2.75 x 12 mm Synergy DES to ostial LCx; 4 x 16 mm Synergy DES to distal  LM  Myoview 12/15 Intermediate risk stress nuclear study Moderate area of anteroapical and anteroseptal ischemia Also inferolateral wall infarct with moderate peri infarct ischemia Study suggests multi vessel diseae. LV Ejection Fraction: 57%  Echo 7/11 Mild LVH, EF 55-60%, no RWMA, mild MR   ASSESSMENT:     1. Persistent atrial fibrillation ([place])   2. Coronary artery disease involving native coronary artery of native heart without angina pectoris   3. Chronic diastolic CHF (congestive heart failure) ([place])   4. Essential hypertension   5. Hyperlipidemia     PLAN:     In order of problems listed above:  1. Persistent AFib - She remains in atrial fibrillation. I suspect that she is symptomatic with fatigue and dyspnea. Heart rate is better controlled. She has been on Eliquis for 3 weeks. I will arrange cardioversion next week. Change Toprol-XL 25 mg to twice a day on the day of her cardioversion. She will need to remain on long-term anticoagulation.  Follow-up one week after cardioversion.   2. CAD - She has atypical chest pain. She is no longer on ASA or Plavix as she is on Eliquis. Continue statin.   3. Chronic Diastolic CHF - Volume appears stable.   4. HTN - Controlled.   5. HL- continue statin.   6. Mild Pulmonary HTN - She has a hx of OSA  but was intol to CPAP.  Consider referral to Dr. Fransico Him in the future.    Medication Adjustments/Labs and Tests Ordered: Current medicines are reviewed at length with the patient today.  Concerns regarding medicines are outlined above.  Medication changes, Labs and Tests ordered today are outlined in the Patient Instructions noted below. Patient Instructions  Medication Instructions:  1. STARTING AS OF 02/13/16 THE MORNING OF YOUR CARDIOVERSION; YOU WILL DECREASE TOPROL XL TO 25 MG TWICE DAILY   Labwork: TODAY BMET  Testing/Procedures: Your physician has recommended that you have a Cardioversion (DCCV). Electrical  Cardioversion uses a jolt of electricity to your heart either through paddles or wired patches attached to your chest. This is a controlled, usually prescheduled, procedure. Defibrillation is done under light anesthesia in the hospital, and you usually go home the day of the procedure. This is done to get your heart back into a normal rhythm. You are not awake for the procedure. Please see the instruction sheet given to you today.  Follow-Up: 02/20/16 @ 2:20 WITH Jeneen Doutt, PAC   Any Other Special Instructions Will Be Listed Below (If Applicable).  If you need a refill on your cardiac medications before your next appointment, please call your pharmacy.   Signed, Richardson Dopp, PA-C  02/06/2016 7:32 PM    [facility] [address], [place], [place]  [address] Phone: [phone]; Fax: [phone]

## 2016-02-14 ENCOUNTER — Encounter (HOSPITAL_COMMUNITY): Payer: Self-pay | Admitting: Cardiovascular Disease

## 2016-02-18 ENCOUNTER — Ambulatory Visit (INDEPENDENT_AMBULATORY_CARE_PROVIDER_SITE_OTHER): Payer: Medicare HMO | Admitting: Family Medicine

## 2016-02-18 ENCOUNTER — Encounter: Payer: Self-pay | Admitting: Family Medicine

## 2016-02-18 VITALS — BP 103/55 | HR 50 | Temp 97.2°F | Ht 67.0 in | Wt 197.0 lb

## 2016-02-18 DIAGNOSIS — I2510 Atherosclerotic heart disease of native coronary artery without angina pectoris: Secondary | ICD-10-CM | POA: Diagnosis not present

## 2016-02-18 DIAGNOSIS — I10 Essential (primary) hypertension: Secondary | ICD-10-CM | POA: Diagnosis not present

## 2016-02-18 DIAGNOSIS — E785 Hyperlipidemia, unspecified: Secondary | ICD-10-CM

## 2016-02-18 DIAGNOSIS — I209 Angina pectoris, unspecified: Secondary | ICD-10-CM

## 2016-02-18 DIAGNOSIS — K219 Gastro-esophageal reflux disease without esophagitis: Secondary | ICD-10-CM | POA: Diagnosis not present

## 2016-02-18 DIAGNOSIS — I480 Paroxysmal atrial fibrillation: Secondary | ICD-10-CM

## 2016-02-18 DIAGNOSIS — I5032 Chronic diastolic (congestive) heart failure: Secondary | ICD-10-CM

## 2016-02-18 DIAGNOSIS — E559 Vitamin D deficiency, unspecified: Secondary | ICD-10-CM | POA: Diagnosis not present

## 2016-02-18 DIAGNOSIS — Z981 Arthrodesis status: Secondary | ICD-10-CM

## 2016-02-18 DIAGNOSIS — D509 Iron deficiency anemia, unspecified: Secondary | ICD-10-CM | POA: Diagnosis not present

## 2016-02-18 DIAGNOSIS — I2583 Coronary atherosclerosis due to lipid rich plaque: Secondary | ICD-10-CM

## 2016-02-18 NOTE — Patient Instructions (Addendum)
Medicare Annual Wellness Visit  [facility] and the medical providers at [facility] strive to bring you the best medical care.  In doing so we not only want to address your current medical conditions and concerns but also to detect new conditions early and prevent illness, disease and health-related problems.    Medicare offers a yearly Wellness Visit which allows our clinical staff to assess your need for preventative services including immunizations, lifestyle education, counseling to decrease risk of preventable diseases and screening for fall risk and other medical concerns.    This visit is provided free of charge (no copay) for all Medicare recipients. The clinical pharmacists at [facility] have begun to conduct these Wellness Visits which will also include a thorough review of all your medications.    As you primary medical provider recommend that you make an appointment for your Annual Wellness Visit if you have not done so already this year.  You may set up this appointment before you leave today or you may call back [phone]) and schedule an appointment.  Please make sure when you call that you mention that you are scheduling your Annual Wellness Visit with the clinical pharmacist so that the appointment may be made for the proper length of time.     Continue current medications. Continue good therapeutic lifestyle changes which include good diet and exercise. Fall precautions discussed with patient. If an FOBT was given today- please return it to our front desk. If you are over 77 years old - you may need Prevnar 25 or the adult Pneumonia vaccine.  **Flu shots are available--- please call and schedule a FLU-CLINIC appointment**  After your visit with [place] today you will receive a survey in the mail or online from [employer] regarding your care with [place]. Please take a moment to fill this out. Your feedback is very  important to [place] as you can help [place] better understand your patient needs as well as improve your experience and satisfaction. WE CARE ABOUT YOU!!!    the patient should continue to follow up with the cardiologist as planned  She should keep following up with her gynecologist for her pelvic exams.

## 2016-02-18 NOTE — Progress Notes (Signed)
Subjective:    Patient ID: [name] [name], female    DOB: 22-Nov-1941, 75 y.o.   MRN: [id number]  HPI Pt here for follow up and management of chronic medical problems which includes anemia, a fib, hyperlipidemia and hypertension. She is taking medications regularly. This patient is a complicated lady there has had multiple problems with her back and multiple surgeries on her back secondary to degenerative disc disease and spinal stenosis. She is also had paroxysmal atrial fibrillation and coronary artery disease. She is followed regularly by the cardiologist. She complains today of just being weak. The patient had a recent bout with atrial fibrillation and had to be cardioverted. She has an appointment with the cardiologist this week for follow-up. He much confined to her home and unless she is outside she has assistance. She uses her walker around the house. Her daughter does her grocery shopping and her son keeps the house cleaning for her. She uses her walker regularly. She is independent pretty much around the house. At nighttime she especially gets short winded as she prepares to go to bed and says that she hears herself wheezing at nighttime. She continues to have a chronic ongoing back pain. She denies any chest pain and has no problem with her GI tract as far as nausea or vomiting but does have intermittent bouts with constipation and loose bowel movements. She is passing her water without problems. She is not seeing any blood in stool or had any black tarry bowel movements.      Patient Active Problem List   Diagnosis Date Noted  . Chronic diastolic CHF (congestive heart failure) ([place]) 07/17/2015  . Anemia, iron deficiency 07/02/2015  . DDD (degenerative disc disease), lumbar 03/22/2015  . Fall 03/22/2015  . 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 Difficulty hearing 03/22/2015  . Adaptive colitis 03/22/2015  . Lichen 50/56/9794  . Lumbar scoliosis 03/22/2015  . Fracture of pelvis ([place])  03/22/2015  . Herpes zona 03/22/2015  . History of other specified conditions presenting hazards to health 03/22/2015  . Anemia 01/08/2015  . MGUS (monoclonal gammopathy of unknown significance) 01/08/2015  . Ischemic chest pain ([place])   . Paroxysmal atrial fibrillation ([place]) 12/22/2014  . CAD (coronary artery disease) 12/22/2014  . Memory disorder 12/04/2014  . Deficiency anemia 11/10/2014  . Vertigo 10/27/2014  . Angulation of spine 09/18/2014  . Metabolic syndrome 80/16/5537  . HCAP (healthcare-associated pneumonia) 03/27/2014  . S/P spinal fusion 02/23/2014  . Chronic pain associated with significant psychosocial dysfunction 02/23/2014  . Preoperative evaluation of a medical condition to rule out surgical contraindications (TAR required) 10/09/2013  . Abdominal pain 07/06/2013  . Gastroesophageal hernia 07/06/2013  . Incontinence 10/13/2012  . History of IBS 01/26/2012  . 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 EDEMA 06/25/2010  . DEGENERATIVE JOINT DISEASE 04/08/2010  . OTHER DISORDERS OF NERVOUS SYSTEM&SENSE ORGANS 04/08/2010  . Obesity (BMI 30.0-34.9) 04/04/2009  . CIRCADIAN RHYTHM SLEEP D/O DELAY SLEEP PHSE TYPE 04/04/2009  . CARPAL TUNNEL SYNDROME 04/04/2009  . Diarrhea 01/17/2009  . IRRITABLE BOWEL SYNDROME 11/13/2008  . ESOPHAGEAL STRICTURE 11/12/2008  . GERD 11/12/2008  . DUODENITIS, WITHOUT HEMORRHAGE 11/12/2008  . Diaphragmatic hernia without mention of obstruction or gangrene 11/12/2008  . DIVERTICULOSIS, COLON 11/12/2008  . COLONIC POLYPS, HX OF 11/12/2008  . Obstructive sleep apnea 05/23/2008  . DYSPNEA 05/23/2008  . Hyperlipidemia 05/22/2008  . Depression 05/22/2008  . Essential hypertension 05/22/2008  . INSOMNIA 05/22/2008   Outpatient Encounter Prescriptions as of 02/18/2016  Medication Sig  .  apixaban (ELIQUIS) 5 MG TABS tablet Take 1 tablet (5 mg total) by mouth 2 (two) times daily.  . Calcium Citrate-Vitamin D (CALCIUM + D PO) Take 1 tablet by  mouth daily.  . Cholecalciferol (VITAMIN D) 2000 units tablet Take 2,000 Units by mouth daily.  . colesevelam (WELCHOL) 625 MG tablet Take 625 mg by mouth 2 (two) times daily with a meal.   . diphenoxylate-atropine (LOMOTIL) 2.5-0.025 MG per tablet Take 1 tablet by mouth 4 (four) times daily as needed for diarrhea or loose stools.   . Eszopiclone 3 MG TABS Take 1 tablet (3 mg total) by mouth at bedtime. For insomnia,take immediately before bedtime.  . fentaNYL (DURAGESIC - DOSED MCG/HR) 50 MCG/HR Place 1 patch (50 mcg total) onto the skin every 3 (three) days.  Marland Kitchen FLUoxetine (PROZAC) 40 MG capsule Take 1 capsule by mouth  daily for depression  . furosemide (LASIX) 40 MG tablet Take 20 mg by mouth daily.  Marland Kitchen HYDROcodone-acetaminophen (NORCO) 10-325 MG per tablet Take 1 tablet by mouth every 4 (four) hours as needed for moderate pain.   . iron polysaccharides (NIFEREX) 150 MG capsule Take 1 capsule (150 mg total) by mouth daily.  Marland Kitchen LORazepam (ATIVAN) 0.5 MG tablet Take 1 tablet (0.5 mg total) by mouth 2 (two) times daily as needed for anxiety.  . meclizine (ANTIVERT) 25 MG tablet Take 1 tablet (25 mg total) by mouth 3 (three) times daily as needed for dizziness.  . methocarbamol (ROBAXIN) 500 MG tablet Take 1 tablet (500 mg total) by mouth 2 (two) times daily.  . metoprolol succinate (TOPROL-XL) 25 MG 24 hr tablet Take 2 tablets (50 mg total) by mouth as directed. 50 mg in the AM; 25 mg PM; Take with or immediately following a meal.  . Multiple Vitamin (MULTIVITAMIN) tablet Take 1 tablet by mouth daily. For supplement  . nitroGLYCERIN (NITROSTAT) 0.4 MG SL tablet Place 1 tablet (0.4 mg total) under the tongue every 5 (five) minutes as needed for chest pain (up to 3 doses).  . pantoprazole (PROTONIX) 40 MG tablet Take 1 tablet (40 mg total) by mouth 2 (two) times daily.  . rosuvastatin (CRESTOR) 20 MG tablet Take 20 mg by mouth. Patient alternating taking 1 whole tablet (20 mg) by mouth once daily and  1/2 tablet (10 mg ) by mouth once daily the next day.   No facility-administered encounter medications on file as of 02/18/2016.      Review of Systems  HENT: Negative.   Eyes: Negative.   Respiratory: Negative.   Cardiovascular: Negative.   Gastrointestinal: Negative.   Endocrine: Negative.   Genitourinary: Negative.   Musculoskeletal: Negative.   Skin: Negative.   Allergic/Immunologic: Negative.   Neurological: Positive for weakness.  Hematological: Negative.   Psychiatric/Behavioral: Negative.        Objective:   Physical Exam  Constitutional: She is oriented to person, place, and time. She appears well-developed and well-nourished. No distress.  The patient I believe is doing better overall considering all the issues she has been through over the past 2-3 years with her back.  HENT:  Head: Normocephalic and atraumatic.  Right Ear: External ear normal.  Left Ear: External ear normal.  Nose: Nose normal.  Mouth/Throat: Oropharynx is clear and moist.  Eyes: Conjunctivae and EOM are normal. Pupils are equal, round, and reactive to light. Right eye exhibits no discharge. Left eye exhibits no discharge. No scleral icterus.  Neck: Normal range of motion. Neck supple. No thyromegaly present.  No carotid bruits audible  Cardiovascular: Normal rate, regular rhythm, normal heart sounds and intact distal pulses.   No murmur heard. The rhythm appears to be regular today at 56-60/m.  Pulmonary/Chest: Effort normal and breath sounds normal. No respiratory distress. She has no wheezes. She has no rales. She exhibits no tenderness.  Clear ant and post  Abdominal: Soft. Bowel sounds are normal. She exhibits no mass. There is tenderness (there is generalized abdominal tenderness without massesor organ enlargement). There is no rebound and no guarding.  Musculoskeletal: She exhibits no edema or tenderness.  The patient's range of motion is limited because of the chronic back pain and she  uses her walker persistently. She is able to get on the table with assistance.  Lymphadenopathy:    She has no cervical adenopathy.  Neurological: She is alert and oriented to person, place, and time. She has normal reflexes. No cranial nerve deficit.  Skin: Skin is warm and dry. No rash noted.  Psychiatric: She has a normal mood and affect. Her behavior is normal. Judgment and thought content normal.  Nursing note and vitals reviewed.  . BP 103/55 mmHg  Pulse 50  Temp(Src) 97.2 F (36.2 C) (Oral)  Ht _0  (1.702 m)  Wt 197 lb (89.359 kg)  BMI 30.85 kg/m2       Assessment & Plan:  1. Paroxysmal atrial fibrillation (HCC) -The patient should continue with her follow-up with the cardiologist and should continue with her eliquis - CBC with Differential/Platelet  2. Vitamin D deficiency -Continue with vitamin D replacement pending results of lab work - CBC with Differential/Platelet - VITAMIN D 25 Hydroxy (Vit-D Deficiency, Fractures)  3. Essential hypertension -The blood pressure is good today she should continue with - BMP8+EGFR - CBC with Differential/Platelet - Hepatic function panel  4. Hyperlipemia -Continue with Crestor and as aggressive therapeutic lifestyle changes as possible - CBC with Differential/Platelet - NMR, lipoprofile  5. Gastroesophageal reflux disease, esophagitis presence not specified -She is currently having no problems with reflux and she should continue with her pantoprazole - CBC with Differential/Platelet - Hepatic function panel  6. Anemia, iron deficiency -Continue with multivitamin and Niferex pending results of lab work - CBC with Differential/Platelet  7. S/P spinal fusion -The patient continues to improve slowly from her multiple back surgeries.  8. Ischemic chest pain ([place]) -Continue follow-up with cardiology  9. Hyperlipidemia -Continue with Crestor  10. Chronic diastolic CHF (congestive heart failure) ([place]) -Follow-up with  cardiology  11. Coronary artery disease due to lipid rich plaque -Follow-up with cardiology and continue with aggressive therapeutic lifestyle changes  Patient Instructions                       Medicare Annual Wellness Visit  [facility] and the medical providers at [facility] strive to bring you the best medical care.  In doing so we not only want to address your current medical conditions and concerns but also to detect new conditions early and prevent illness, disease and health-related problems.    Medicare offers a yearly Wellness Visit which allows our clinical staff to assess your need for preventative services including immunizations, lifestyle education, counseling to decrease risk of preventable diseases and screening for fall risk and other medical concerns.    This visit is provided free of charge (no copay) for all Medicare recipients. The clinical pharmacists at [facility] have begun to conduct these Wellness Visits which will also  include a thorough review of all your medications.    As you primary medical provider recommend that you make an appointment for your Annual Wellness Visit if you have not done so already this year.  You may set up this appointment before you leave today or you may call back ([phone]) and schedule an appointment.  Please make sure when you call that you mention that you are scheduling your Annual Wellness Visit with the clinical pharmacist so that the appointment may be made for the proper length of time.     Continue current medications. Continue good therapeutic lifestyle changes which include good diet and exercise. Fall precautions discussed with patient. If an FOBT was given today- please return it to our front desk. If you are over 6 years old - you may need Prevnar 41 or the adult Pneumonia vaccine.  **Flu shots are available--- please call and schedule a FLU-CLINIC appointment**  After your  visit with [place] today you will receive a survey in the mail or online from [employer] regarding your care with [place]. Please take a moment to fill this out. Your feedback is very important to [place] as you can help [place] better understand your patient needs as well as improve your experience and satisfaction. WE CARE ABOUT YOU!!!    the patient should continue to follow up with the cardiologist as planned  She should keep following up with her gynecologist for her pelvic exams.   Arrie Senate MD

## 2016-02-19 LAB — VITAMIN D 25 HYDROXY (VIT D DEFICIENCY, FRACTURES): Vit D, 25-Hydroxy: 35.7 ng/mL (ref 30.0–100.0)

## 2016-02-19 LAB — CBC WITH DIFFERENTIAL/PLATELET
BASOS ABS: 0 10*3/uL (ref 0.0–0.2)
Basos: 1 %
EOS (ABSOLUTE): 0.2 10*3/uL (ref 0.0–0.4)
Eos: 5 %
HEMOGLOBIN: 13.2 g/dL (ref 11.1–15.9)
Hematocrit: 40 % (ref 34.0–46.6)
IMMATURE GRANS (ABS): 0 10*3/uL (ref 0.0–0.1)
IMMATURE GRANULOCYTES: 0 %
LYMPHS: 29 %
Lymphocytes Absolute: 1.4 10*3/uL (ref 0.7–3.1)
MCH: 30 pg (ref 26.6–33.0)
MCHC: 33 g/dL (ref 31.5–35.7)
MCV: 91 fL (ref 79–97)
MONOCYTES: 8 %
Monocytes Absolute: 0.4 10*3/uL (ref 0.1–0.9)
Neutrophils Absolute: 2.8 10*3/uL (ref 1.4–7.0)
Neutrophils: 57 %
Platelets: 181 10*3/uL (ref 150–379)
RBC: 4.4 x10E6/uL (ref 3.77–5.28)
RDW: 13.4 % (ref 12.3–15.4)
WBC: 4.8 10*3/uL (ref 3.4–10.8)

## 2016-02-19 LAB — BMP8+EGFR
BUN/Creatinine Ratio: 14 (ref 11–26)
BUN: 10 mg/dL (ref 8–27)
CALCIUM: 9 mg/dL (ref 8.7–10.3)
CHLORIDE: 99 mmol/L (ref 96–106)
CO2: 27 mmol/L (ref 18–29)
Creatinine, Ser: 0.72 mg/dL (ref 0.57–1.00)
GFR calc non Af Amer: 82 mL/min/{1.73_m2} (ref >59)
GFR, EST AFRICAN AMERICAN: 94 mL/min/{1.73_m2} (ref >59)
GLUCOSE: 104 mg/dL — AB (ref 65–99)
Potassium: 4.3 mmol/L (ref 3.5–5.2)
Sodium: 141 mmol/L (ref 134–144)

## 2016-02-19 LAB — HEPATIC FUNCTION PANEL
ALBUMIN: 3.7 g/dL (ref 3.5–4.8)
ALT: 17 IU/L (ref 0–32)
AST: 21 IU/L (ref 0–40)
Alkaline Phosphatase: 81 IU/L (ref 39–117)
BILIRUBIN, DIRECT: 0.18 mg/dL (ref 0.00–0.40)
Bilirubin Total: 0.5 mg/dL (ref 0.0–1.2)
TOTAL PROTEIN: 5.9 g/dL — AB (ref 6.0–8.5)

## 2016-02-19 LAB — NMR, LIPOPROFILE
Cholesterol: 119 mg/dL (ref 100–199)
HDL CHOLESTEROL BY NMR: 57 mg/dL (ref >39)
HDL Particle Number: 35.1 umol/L (ref >=30.5)
LDL PARTICLE NUMBER: 698 nmol/L (ref <1000)
LDL Size: 20.4 nm (ref >20.5)
LDL-C: 38 mg/dL (ref 0–99)
LP-IR Score: 41 (ref <=45)
Small LDL Particle Number: 323 nmol/L (ref <=527)
TRIGLYCERIDES BY NMR: 121 mg/dL (ref 0–149)

## 2016-02-19 NOTE — Progress Notes (Signed)
Cardiology Office Note:    Date:  02/20/2016   ID:  [name] [name], DOB Nov 10, 1941, MRN [id number]  PCP:  Redge Gainer, MD  Cardiologist:  Dr. Loralie Champagne  >> patient requests Dr. Sherren Mocha  Electrophysiologist:  n/a  Chief Complaint  Patient presents with  . Atrial Fibrillation    Follow up s/p DCCV    History of Present Illness:     [name] [name] is a 75 y.o. female with a hx of CAD, diastolic HF, HTN, HL, PAF. She has had multiple back surgeries. She has a hx of orthostatic hypotension and multiple falls as well as confusion. She has been kept off of anticoagulation. She had chest pain in 12/15 and Myoview was abnormal. LHC demonstrated complex bifurcational distal LM/ostial LCx disease. She was felt to be a poor CABG candidate due to difficulty with rehab. She underwent bifurcational PCI with DES to the distal LM and ostial LCx.   I saw her 1/17 and she was noted to be in AF with RVR when she presented to [facility] for pre-op workup for upcoming colonoscopy. I reviewed her Varin with Dr. Loralie Champagne.  We put her on Eliquis 5 bid and Toprol.  Echo 01/29/16 demonstrated normal LVF, mod LAE, mild MR and PASP 37 mmHg.  TSH was normal.   She was set up for cardioversion after 4 weeks of uninterrupted anticoagulation 02/13/16. She returns for follow-up. She remains in atrial fibrillation. She continues to feel fatigued. She has chronic dyspnea without change. Denies significant change in her chronic chest pain. Denies orthopnea, PND. Denies significant edema. Denies syncope.   Past Medical History  Diagnosis Date  . Obesity   . Circadian rhythm sleep disorder   . CTS (carpal tunnel syndrome)   . Diarrhea   . IBS (irritable bowel syndrome)   . Diverticulitis of colon   . Gastritis   . Esophageal stricture   . Personal history of colonic polyps 10/25/2011 & 12/02/11    not retrieved Dr Lyla Son & tubular adenomas  . Hiatal hernia   . GERD (gastroesophageal reflux disease)    . Insomnia   . HTN (hypertension)   . Hyperlipidemia   . Depression   . Chronic diastolic CHF (congestive heart failure) ([place])   . Memory disorder 12/04/2014  . Pneumonia 03/2014  . Obstructive sleep apnea     "have mask; don't wear it" (01/07/2015)  . Anemia   . History of blood transfusion     "most of them related to OR's"   . MGUS (monoclonal gammopathy of unknown significance) dx'd 11/2014    a. Neg BMB 11/2014.  . Stroke [facility]) early 2000's    "small"; denies residual on 01/07/2015)  . Arthritis     "knees, back, fingers, toes; joints" (01/07/2015)  . Chronic back pain greater than 3 months duration     a. spinal stenosis. Spinal fusion with rods in 2/15 at [facility] spinal fusion 9/15.  Marland Kitchen Anxiety   . CAD (coronary artery disease)     a. Abnl nuc 11/2014. Cath 12/2014 - turned down for CABG. Ultimately s/p TTVP, rotational atherectomy, PTCA and stenting of the ostial LCx and left main into the LAD (crush technique), and IVUS of the LAD/Left main.  Marland Kitchen PAF (paroxysmal atrial fibrillation) ([place])     a.  Paroxysmal. Has only been noted post-op 9/15 spinal fusion. She had cardioversion, not on anticoagulation. Fall risk, unsteady.  . Sinus bradycardia     a. Baseline HR  50s-60s.  Marland Kitchen PAT (paroxysmal atrial tachycardia) (HCC)   . Multiple falls   . Orthostasis   . History of echocardiogram     a. Echo 2/17:  EF 50-55%, trivial AI, midl MR, mod LAE, PASP 37 mmHg  1. Chronic low back pain: spinal stenosis. Spinal fusion with rods in 2/15 at [facility]. Repeat spinal fusion 9/15.  2. Obesity 3. H/o diverticulitis 4. IBS 5. H/o colon polyps 6. GERD: h/o hiatal hernia 7. OSA 8. HTN 9. Hyperlipidemia 10. Depression 11. Cholecystectomy 12. H/o bilateral TKR 13. CAD: LHC (5/09) with 50% ostial LCx, 30-40% mRCA, 30% mLAD. Lexiscan Cardiolite in 11/14 with EF 68%, no ischemia or infarction. Lexiscan Cardiolite (12/15) with EF 57%, moderate anteroapical and anteroseptal  ischemia, inferolateral infarct with peri-infarct ischemia => suggestive of multivessel disease. LHC (1/16) with severe distal left main and ostial LCx stenosis confirmed by FFR. Patient had bifurcation stenting of the distal LM/ostial LCx with rotational atherectomy.  14. PNA in 3/15 15. Atrial fibrillation: Paroxysmal. Has only been noted post-op 9/15 spinal fusion. She had cardioversion, not on anticoagulation.  16. MGUS: Negative bone marrow biopsy 12/15.  17. Anemia  Past Surgical History  Procedure Laterality Date  . Carpal tunnel release Right 1980's  . Total knee arthroplasty Bilateral 1990's - 2000's  . Cataract extraction Bilateral   . Left heart catheterization with coronary angiogram N/A 12/27/2014    Procedure: LEFT HEART CATHETERIZATION WITH CORONARY ANGIOGRAM;  Surgeon: Laurey Morale, MD;  Location: [facility] CATH LAB;  Service: Cardiovascular;  Laterality: N/A;  . Percutaneous coronary rotoblator intervention (pci-r)  01/07/2015  . Cardiac catheterization  12/27/2014    Procedure: INTRAVASCULAR PRESSURE WIRE/FFR STUDY;  Surgeon: Laurey Morale, MD;  Location: [facility] CATH LAB;  Service: Cardiovascular;;  . Coronary angioplasty with stent placement  01/07/2015    "2"  . Laparoscopic cholecystectomy  2003  . Joint replacement    . Knee arthroscopy Left 1995  . Back surgery    . Posterior lumbar fusion  01/2015  . Posterior fusion thoracic spine  08/2015  . Dilation and curettage of uterus    . Cataract extraction w/ intraocular lens  implant, bilateral  2000's  . Esophagogastroduodenoscopy (egd) with esophageal dilation  "several times"  . Bone marrow biopsy  11/2014  . Percutaneous coronary rotoblator intervention (pci-r) N/A 01/07/2015    Procedure: PERCUTANEOUS CORONARY ROTOBLATOR INTERVENTION (PCI-R);  Surgeon: Micheline Chapman, MD;  Location: [facility] CATH LAB;  Service: Cardiovascular;  Laterality: N/A;  . Coronary stent placement    . Cardioversion N/A 02/13/2016    Procedure:  CARDIOVERSION;  Surgeon: Wendall Stade, MD;  Location: [facility] ENDOSCOPY;  Service: Cardiovascular;  Laterality: N/A;    Current Medications: Outpatient Prescriptions Prior to Visit  Medication Sig Dispense Refill  . apixaban (ELIQUIS) 5 MG TABS tablet Take 1 tablet (5 mg total) by mouth 2 (two) times daily. 60 tablet 11  . Calcium Citrate-Vitamin D (CALCIUM + D PO) Take 1 tablet by mouth daily.    . Cholecalciferol (VITAMIN D) 2000 units tablet Take 2,000 Units by mouth daily.    . colesevelam (WELCHOL) 625 MG tablet Take 625 mg by mouth 2 (two) times daily with a meal.     . diphenoxylate-atropine (LOMOTIL) 2.5-0.025 MG per tablet Take 1 tablet by mouth 4 (four) times daily as needed for diarrhea or loose stools.     . Eszopiclone 3 MG TABS Take 1 tablet (3 mg total) by mouth at bedtime. For insomnia,take  immediately before bedtime. 90 tablet 1  . fentaNYL (DURAGESIC - DOSED MCG/HR) 50 MCG/HR Place 1 patch (50 mcg total) onto the skin every 3 (three) days. 10 patch 0  . FLUoxetine (PROZAC) 40 MG capsule Take 1 capsule by mouth  daily for depression 90 capsule 3  . furosemide (LASIX) 40 MG tablet Take 20 mg by mouth daily.    Marland Kitchen HYDROcodone-acetaminophen (NORCO) 10-325 MG per tablet Take 1 tablet by mouth every 4 (four) hours as needed for moderate pain.     . iron polysaccharides (NIFEREX) 150 MG capsule Take 1 capsule (150 mg total) by mouth daily. 90 capsule 3  . LORazepam (ATIVAN) 0.5 MG tablet Take 1 tablet (0.5 mg total) by mouth 2 (two) times daily as needed for anxiety. 60 tablet 1  . meclizine (ANTIVERT) 25 MG tablet Take 1 tablet (25 mg total) by mouth 3 (three) times daily as needed for dizziness. 60 tablet 0  . Multiple Vitamin (MULTIVITAMIN) tablet Take 1 tablet by mouth daily. For supplement    . nitroGLYCERIN (NITROSTAT) 0.4 MG SL tablet Place 1 tablet (0.4 mg total) under the tongue every 5 (five) minutes as needed for chest pain (up to 3 doses). 25 tablet 3  . pantoprazole  (PROTONIX) 40 MG tablet Take 1 tablet (40 mg total) by mouth 2 (two) times daily. 180 tablet 3  . rosuvastatin (CRESTOR) 20 MG tablet Take 20 mg by mouth. Patient alternating taking 1 whole tablet (20 mg) by mouth once daily and 1/2 tablet (10 mg ) by mouth once daily the next day.    . metoprolol succinate (TOPROL-XL) 25 MG 24 hr tablet Take 2 tablets (50 mg total) by mouth as directed. 50 mg in the AM; 25 mg PM; Take with or immediately following a meal. 90 tablet 11  . methocarbamol (ROBAXIN) 500 MG tablet Take 1 tablet (500 mg total) by mouth 2 (two) times daily. 20 tablet 0   No facility-administered medications prior to visit.     Allergies:   Cymbalta; Morphine and related; Oxycodone-acetaminophen; Percocet; Valium; Duloxetine; Altace; Floxin; Hydromorphone; Lipitor; Lovaza; Pravachol; Trilipix; Zetia; and Zocor   Social History   Social History  . Marital Status: Widowed    Spouse Name: N/A  . Number of Children: 2  . Years of Education: HS   Occupational History  . librarian- retired     retired   Social History Main Topics  . Smoking status: Never Smoker   . Smokeless tobacco: Never Used  . Alcohol Use: No     Comment: 01/07/2015 "glass of wine at Christmas, maybe"  . Drug Use: No  . Sexual Activity: No   Other Topics Concern  . None   Social History Narrative   Patient is right handed   Patient drinks 1-2 sodas daily.   patlient lives alone.        Family History:  The patient's family history includes Colon cancer in her sister and sister; Coronary artery disease in her brother, father, and mother; Emphysema in her brother and sister; Peripheral vascular disease in her father; Sleep apnea in her son. There is no history of Dementia.   ROS:   Please see the history of present illness.    Review of Systems  Constitution: Positive for malaise/fatigue.  Cardiovascular: Positive for dyspnea on exertion.  Musculoskeletal: Positive for joint pain.  Neurological:  Positive for loss of balance.  All other systems reviewed and are negative.   Physical Exam:  VS:  BP 98/50 mmHg  Pulse 84  Ht _0  (1.702 m)  Wt 195 lb (88.451 kg)  BMI 30.53 kg/m2   GEN: Well nourished, well developed, in no acute distress HEENT: normal Neck: no JVD, no masses Cardiac: Normal S1/S2, irregularly irregular rhythm; no murmur,  no edema;    Respiratory:  clear to auscultation bilaterally; no wheezing, rhonchi or rales GI: soft, nontender  MS: no deformity or atrophy Skin: warm and dry,  Neuro:  no focal deficits  Psych: Alert and oriented x 3, normal affect  Wt Readings from Last 3 Encounters:  02/20/16 195 lb (88.451 kg)  02/18/16 197 lb (89.359 kg)  02/13/16 194 lb (87.998 kg)      Studies/Labs Reviewed:     EKG:  EKG is  ordered today.  The ekg ordered today demonstrates AFib, HR 80  Recent Labs: 08/15/2015: BNP 43.7 01/16/2016: Hemoglobin 13.0; TSH 1.690 02/18/2016: ALT 17; BUN 10; Creatinine, Ser 0.72; Platelets 181; Potassium 4.3; Sodium 141   Recent Lipid Panel    Component Value Date/Time   CHOL 119 02/18/2016 1504   CHOL 158 01/03/2015 1310   CHOL 156 05/11/2013 1536   TRIG 121 02/18/2016 1504   TRIG 171* 01/03/2015 1310   TRIG 239* 05/11/2013 1536   HDL 57 02/18/2016 1504   HDL 61 01/03/2015 1310   HDL 43 05/11/2013 1536   CHOLHDL 2.6 01/03/2015 1310   LDLCALC 63 01/03/2015 1310   LDLCALC 50 08/21/2014 1141   LDLCALC 65 05/11/2013 1536    Additional studies/ records that were reviewed today include:   Echo 01/29/16 EF 50-55%, trivial AI, mild MR, mod LAE, PASP 37 mmHg  LHC 1/16 LM dist ? 50% LAD prox 30% - FFR 0.82 LCx ostial 70% - FFR 0.86 >> 0.66 RCA mid 40% CABG deferred b/c of poor functional capacity PCI: Complex bifurcational distal LM and ostial LCx with rotational atherectomy and IVUS guided with 2.75 x 12 mm Synergy DES to ostial LCx; 4 x 16 mm Synergy DES to distal LM  Myoview 12/15 Intermediate risk stress  nuclear study Moderate area of anteroapical and anteroseptal ischemia Also inferolateral wall infarct with moderate peri infarct ischemia Study suggests multi vessel diseae. LV Ejection Fraction: 57%  Echo 7/11 Mild LVH, EF 55-60%, no RWMA, mild MR   ASSESSMENT:     1. Persistent atrial fibrillation ([place])   2. Coronary artery disease involving native coronary artery of native heart without angina pectoris   3. Chronic diastolic CHF (congestive heart failure) ([place])   4. Essential hypertension   5. Hyperlipidemia   6. Pulmonary HTN ([place])     PLAN:     In order of problems listed above:  1. Persistent AFib - s/p DCCV.  Unfortunately, she is back in atrial fibrillation. She never really remembers feeling any better. She has significant fatigue. She is either fatigued from beta blocker therapy or atrial fibrillation. I will stop her Toprol-XL. I will start her on diltiazem CD 120 mg daily. Continue Eliquis. Recent CBC and BMET stable. Refer to atrial fibrillation clinic for further evaluation and management (Amiodarone versus Tikosyn).  2. CAD - She has a hx of atypical chest pain. She is no longer on ASA or Plavix as she is on Eliquis. Continue statin.   3. Chronic Diastolic CHF -  Volume appears stable.   4. HTN - Controlled.    5. HL-  Continue statin.   6. Mild Pulmonary HTN - She has a hx of  OSA but was intol to CPAP.  Consider referral to Dr. Fransico Him in the future.    Medication Adjustments/Labs and Tests Ordered: Current medicines are reviewed at length with the patient today.  Concerns regarding medicines are outlined above.  Medication changes, Labs and Tests ordered today are outlined in the Patient Instructions noted below. Patient Instructions  Medication Instructions:  1. STOP TOPROL XL 2. START CARDIZEM CD 120 MG DAILY; RX SENT IN  Labwork: NONE  Testing/Procedures: NONE  Follow-Up: 1. YOU ARE BEING REFERRED TO DONNA CARROLL AT THE A-FIB CLINIC 2. DR.  Burt Knack IN 3 MONTHS  Any Other Special Instructions Will Be Listed Below (If Applicable).  If you need a refill on your cardiac medications before your next appointment, please call your pharmacy.   Signed, Richardson Dopp, PA-C  02/20/2016 4:39 PM    [facility] [address], [place], [place]  [address] Phone: [phone]; Fax: [phone]

## 2016-02-20 ENCOUNTER — Ambulatory Visit (INDEPENDENT_AMBULATORY_CARE_PROVIDER_SITE_OTHER): Payer: Medicare HMO | Admitting: Physician Assistant

## 2016-02-20 ENCOUNTER — Encounter: Payer: Self-pay | Admitting: Physician Assistant

## 2016-02-20 VITALS — BP 98/50 | HR 84 | Ht 67.0 in | Wt 195.0 lb

## 2016-02-20 DIAGNOSIS — I4819 Other persistent atrial fibrillation: Secondary | ICD-10-CM

## 2016-02-20 DIAGNOSIS — I2510 Atherosclerotic heart disease of native coronary artery without angina pectoris: Secondary | ICD-10-CM | POA: Diagnosis not present

## 2016-02-20 DIAGNOSIS — I5032 Chronic diastolic (congestive) heart failure: Secondary | ICD-10-CM | POA: Diagnosis not present

## 2016-02-20 DIAGNOSIS — I10 Essential (primary) hypertension: Secondary | ICD-10-CM | POA: Diagnosis not present

## 2016-02-20 DIAGNOSIS — I272 Other secondary pulmonary hypertension: Secondary | ICD-10-CM

## 2016-02-20 DIAGNOSIS — I481 Persistent atrial fibrillation: Secondary | ICD-10-CM

## 2016-02-20 DIAGNOSIS — E785 Hyperlipidemia, unspecified: Secondary | ICD-10-CM

## 2016-02-20 MED ORDER — DILTIAZEM HCL ER COATED BEADS 120 MG PO CP24: 120.0000 mg | ORAL_CAPSULE | Freq: Every day | ORAL | Status: DC

## 2016-02-20 NOTE — Patient Instructions (Addendum)
Medication Instructions:  1. STOP TOPROL XL 2. START CARDIZEM CD 120 MG DAILY; RX SENT IN  Labwork: NONE  Testing/Procedures: NONE  Follow-Up: 1. YOU ARE BEING REFERRED TO DONNA CARROLL AT THE A-FIB CLINIC 2. DR. Burt Knack IN 3 MONTHS  Any Other Special Instructions Will Be Listed Below (If Applicable).  If you need a refill on your cardiac medications before your next appointment, please call your pharmacy.

## 2016-02-26 ENCOUNTER — Ambulatory Visit (HOSPITAL_COMMUNITY)
Admission: RE | Admit: 2016-02-26 | Discharge: 2016-02-26 | Disposition: A | Discharge status: Home / Self Care | Payer: Medicare HMO | Source: Ambulatory Visit | Attending: Nurse Practitioner | Admitting: Nurse Practitioner

## 2016-02-26 VITALS — BP 100/56 | HR 73 | Ht 67.0 in | Wt 198.8 lb

## 2016-02-26 DIAGNOSIS — M549 Dorsalgia, unspecified: Secondary | ICD-10-CM | POA: Insufficient documentation

## 2016-02-26 DIAGNOSIS — F329 Major depressive disorder, single episode, unspecified: Secondary | ICD-10-CM | POA: Insufficient documentation

## 2016-02-26 DIAGNOSIS — Z825 Family history of asthma and other chronic lower respiratory diseases: Secondary | ICD-10-CM | POA: Diagnosis not present

## 2016-02-26 DIAGNOSIS — Z8673 Personal history of transient ischemic attack (TIA), and cerebral infarction without residual deficits: Secondary | ICD-10-CM | POA: Diagnosis not present

## 2016-02-26 DIAGNOSIS — E669 Obesity, unspecified: Secondary | ICD-10-CM | POA: Insufficient documentation

## 2016-02-26 DIAGNOSIS — I110 Hypertensive heart disease with heart failure: Secondary | ICD-10-CM | POA: Diagnosis not present

## 2016-02-26 DIAGNOSIS — E785 Hyperlipidemia, unspecified: Secondary | ICD-10-CM | POA: Insufficient documentation

## 2016-02-26 DIAGNOSIS — I480 Paroxysmal atrial fibrillation: Secondary | ICD-10-CM | POA: Diagnosis not present

## 2016-02-26 DIAGNOSIS — F419 Anxiety disorder, unspecified: Secondary | ICD-10-CM | POA: Diagnosis not present

## 2016-02-26 DIAGNOSIS — K219 Gastro-esophageal reflux disease without esophagitis: Secondary | ICD-10-CM | POA: Insufficient documentation

## 2016-02-26 DIAGNOSIS — Z885 Allergy status to narcotic agent status: Secondary | ICD-10-CM | POA: Diagnosis not present

## 2016-02-26 DIAGNOSIS — I5032 Chronic diastolic (congestive) heart failure: Secondary | ICD-10-CM | POA: Diagnosis not present

## 2016-02-26 DIAGNOSIS — Z6831 Body mass index (BMI) 31.0-31.9, adult: Secondary | ICD-10-CM | POA: Diagnosis not present

## 2016-02-26 DIAGNOSIS — Z79891 Long term (current) use of opiate analgesic: Secondary | ICD-10-CM | POA: Diagnosis not present

## 2016-02-26 DIAGNOSIS — Z79899 Other long term (current) drug therapy: Secondary | ICD-10-CM | POA: Diagnosis not present

## 2016-02-26 DIAGNOSIS — Z888 Allergy status to other drugs, medicaments and biological substances status: Secondary | ICD-10-CM | POA: Diagnosis not present

## 2016-02-26 DIAGNOSIS — D472 Monoclonal gammopathy: Secondary | ICD-10-CM | POA: Diagnosis not present

## 2016-02-26 DIAGNOSIS — I4891 Unspecified atrial fibrillation: Secondary | ICD-10-CM | POA: Insufficient documentation

## 2016-02-26 DIAGNOSIS — I2510 Atherosclerotic heart disease of native coronary artery without angina pectoris: Secondary | ICD-10-CM | POA: Diagnosis not present

## 2016-02-26 DIAGNOSIS — G8929 Other chronic pain: Secondary | ICD-10-CM | POA: Diagnosis not present

## 2016-02-26 DIAGNOSIS — Z7901 Long term (current) use of anticoagulants: Secondary | ICD-10-CM | POA: Diagnosis not present

## 2016-02-26 DIAGNOSIS — G4733 Obstructive sleep apnea (adult) (pediatric): Secondary | ICD-10-CM | POA: Diagnosis not present

## 2016-02-27 ENCOUNTER — Encounter (HOSPITAL_COMMUNITY): Payer: Self-pay | Admitting: Nurse Practitioner

## 2016-02-27 NOTE — Progress Notes (Signed)
Patient ID: [name] [name], female   DOB: [birth year], 75 y.o.   MRN: [id number]     Primary Care Physician: Redge Gainer, MD Referring Physician: Richardson Dopp, PA   [name] [name] is a 75 y.o. female with a h/o CAD, diastolic HF, HTN, HL, PAF. She has had multiple back surgeries. She has a hx of orthostatic hypotension and multiple falls as well as confusion. She has been kept off of anticoagulation. She had chest pain in 12/15 and Myoview was abnormal. LHC demonstrated complex bifurcational distal LM/ostial LCx disease. She was felt to be a poor CABG candidate due to difficulty with rehab. She underwent bifurcational PCI with DES to the distal LM and ostial LCx.   She was seen by Richardson Dopp 1/17 and was noted to be in AF with RVR when she presented to [facility] for pre-op workup for upcoming colonoscopy. I reviewed her [place] with Dr. Loralie Champagne. We put her on Eliquis 5 bid and Toprol. Echo 01/29/16 demonstrated normal LVF, mod LAE, mild MR and PASP 37 mmHg. TSH was normal.   She was set up for cardioversion after 4 weeks of uninterrupted anticoagulation 02/13/16. She returns for follow-up. She remains in atrial fibrillation. She continues to feel fatigued. She has chronic dyspnea without change. Denies significant change in her chronic chest pain. Denies orthopnea, PND. Denies significant edema. Denies syncope.  She was referred to afib clinic after ERAF following cardioversion to discuss antiarrythmic's.. Pt has multi[ple comorbidities and chronic fatigue, poor balance and frequent falls.  Takes narcotics on a daily basis due to chronic back pain.Pt in the past has been told that she had mild dementia and the daughter is concerned re her mother's memory. She is having difficulties taking her medicine because she sleeps to noon and stays up watching TV til 2am. The daughter pre pours her meds and the am eliquis is rarely taken but she does take her pm dose. She states that she feels terrible  today, no energy but her EKG shows SR. She was recently changed form Toprol due to fatigue to Cardizem. She is very sedentary and deconditioning may be playing into her overall fatigue as well.   Today, she denies symptoms of palpitations, chest pain, shortness of breath, orthopnea, PND, lower extremity edema, dizziness, presyncope, syncope, or neurologic sequela. She describes fatigue, chronic back pain, poor balance, no recent falls. She is not reliable in taking prescription meds.  Past Medical History  Diagnosis Date  . Obesity   . Circadian rhythm sleep disorder   . CTS (carpal tunnel syndrome)   . Diarrhea   . IBS (irritable bowel syndrome)   . Diverticulitis of colon   . Gastritis   . Esophageal stricture   . Personal history of colonic polyps 10/25/2011 & 12/02/11    not retrieved Dr Lyla Son & tubular adenomas  . Hiatal hernia   . GERD (gastroesophageal reflux disease)   . Insomnia   . HTN (hypertension)   . Hyperlipidemia   . Depression   . Chronic diastolic CHF (congestive heart failure) ([place])   . Memory disorder 12/04/2014  . Pneumonia 03/2014  . Obstructive sleep apnea     "have mask; don't wear it" (01/07/2015)  . Anemia   . History of blood transfusion     "most of them related to OR's"   . MGUS (monoclonal gammopathy of unknown significance) dx'd 11/2014    a. Neg BMB 11/2014.  . Stroke [facility]) early 2000's    "small"; denies  residual on 01/07/2015)  . Arthritis     "knees, back, fingers, toes; joints" (01/07/2015)  . Chronic back pain greater than 3 months duration     a. spinal stenosis. Spinal fusion with rods in 2/15 at [facility] spinal fusion 9/15.  Marland Kitchen Anxiety   . CAD (coronary artery disease)     a. Abnl nuc 11/2014. Cath 12/2014 - turned down for CABG. Ultimately s/p TTVP, rotational atherectomy, PTCA and stenting of the ostial LCx and left main into the LAD (crush technique), and IVUS of the LAD/Left main.  Marland Kitchen PAF (paroxysmal atrial fibrillation)  ([place])     a.  Paroxysmal. Has only been noted post-op 9/15 spinal fusion. She had cardioversion, not on anticoagulation. Fall risk, unsteady.  . Sinus bradycardia     a. Baseline HR 50s-60s.  Marland Kitchen PAT (paroxysmal atrial tachycardia) ([place])   . Multiple falls   . Orthostasis   . History of echocardiogram     a. Echo 2/17:  EF 50-55%, trivial AI, midl MR, mod LAE, PASP 37 mmHg   Past Surgical History  Procedure Laterality Date  . Carpal tunnel release Right 1980's  . Total knee arthroplasty Bilateral 1990's - 2000's  . Cataract extraction Bilateral   . Left heart catheterization with coronary angiogram N/A 12/27/2014    Procedure: LEFT HEART CATHETERIZATION WITH CORONARY ANGIOGRAM;  Surgeon: Larey Dresser, MD;  Location[facility] CATH LAB;  Service: Cardiovascular;  Laterality: N/A;  . Percutaneous coronary rotoblator intervention (pci-r)  01/07/2015  . Cardiac catheterization  12/27/2014    Procedure: INTRAVASCULAR PRESSURE WIRE/FFR STUDY;  Surgeon: Larey Dresser, MD;  Location: [facility] CATH LAB;  Service: Cardiovascular;;  . Coronary angioplasty with stent placement  01/07/2015    "2"  . Laparoscopic cholecystectomy  2003  . Joint replacement    . Knee arthroscopy Left 1995  . Back surgery    . Posterior lumbar fusion  01/2015  . Posterior fusion thoracic spine  08/2015  . Dilation and curettage of uterus    . Cataract extraction w/ intraocular lens  implant, bilateral  2000's  . Esophagogastroduodenoscopy (egd) with esophageal dilation  "several times"  . Bone marrow biopsy  11/2014  . Percutaneous coronary rotoblator intervention (pci-r) N/A 01/07/2015    Procedure: PERCUTANEOUS CORONARY ROTOBLATOR INTERVENTION (PCI-R);  Surgeon: Blane Ohara, MD;  Location: [facility] CATH LAB;  Service: Cardiovascular;  Laterality: N/A;  . Coronary stent placement    . Cardioversion N/A 02/13/2016    Procedure: CARDIOVERSION;  Surgeon: Josue Hector, MD;  Location: [facility] ENDOSCOPY;  Service: Cardiovascular;   Laterality: N/A;    Current Outpatient Prescriptions  Medication Sig Dispense Refill  . apixaban (ELIQUIS) 5 MG TABS tablet Take 1 tablet (5 mg total) by mouth 2 (two) times daily. 60 tablet 11  . Calcium Citrate-Vitamin D (CALCIUM + D PO) Take 1 tablet by mouth daily.    . Cholecalciferol (VITAMIN D) 2000 units tablet Take 2,000 Units by mouth daily.    . colesevelam (WELCHOL) 625 MG tablet Take 625 mg by mouth 2 (two) times daily with a meal.     . diltiazem (CARDIZEM CD) 120 MG 24 hr capsule Take 1 capsule (120 mg total) by mouth daily. 30 capsule 11  . diphenoxylate-atropine (LOMOTIL) 2.5-0.025 MG per tablet Take 1 tablet by mouth 4 (four) times daily as needed for diarrhea or loose stools.     . Eszopiclone 3 MG TABS Take 1 tablet (3 mg total) by mouth at bedtime. For  insomnia,take immediately before bedtime. 90 tablet 1  . fentaNYL (DURAGESIC - DOSED MCG/HR) 50 MCG/HR Place 1 patch (50 mcg total) onto the skin every 3 (three) days. 10 patch 0  . FLUoxetine (PROZAC) 40 MG capsule Take 1 capsule by mouth  daily for depression 90 capsule 3  . furosemide (LASIX) 40 MG tablet Take 20 mg by mouth daily.    Marland Kitchen HYDROcodone-acetaminophen (NORCO) 10-325 MG per tablet Take 1 tablet by mouth every 4 (four) hours as needed for moderate pain.     . iron polysaccharides (NIFEREX) 150 MG capsule Take 1 capsule (150 mg total) by mouth daily. 90 capsule 3  . meclizine (ANTIVERT) 25 MG tablet Take 1 tablet (25 mg total) by mouth 3 (three) times daily as needed for dizziness. 60 tablet 0  . methocarbamol (ROBAXIN) 500 MG tablet Take 500 mg by mouth 4 (four) times daily as needed for muscle spasms.    . Multiple Vitamin (MULTIVITAMIN) tablet Take 1 tablet by mouth daily. For supplement    . nitroGLYCERIN (NITROSTAT) 0.4 MG SL tablet Place 1 tablet (0.4 mg total) under the tongue every 5 (five) minutes as needed for chest pain (up to 3 doses). 25 tablet 3  . pantoprazole (PROTONIX) 40 MG tablet Take 1 tablet (40  mg total) by mouth 2 (two) times daily. 180 tablet 3  . rosuvastatin (CRESTOR) 20 MG tablet Take 20 mg by mouth. Patient alternating taking 1 whole tablet (20 mg) by mouth once daily and 1/2 tablet (10 mg ) by mouth once daily the next day.    Marland Kitchen LORazepam (ATIVAN) 0.5 MG tablet Take 1 tablet (0.5 mg total) by mouth 2 (two) times daily as needed for anxiety. (Patient not taking: Reported on 02/26/2016) 60 tablet 1   No current facility-administered medications for this encounter.    Allergies  Allergen Reactions  . Cymbalta [Duloxetine Hcl] Other (See Comments)    Personality changes - crying  2014  . Morphine And Related Itching  . Oxycodone-Acetaminophen Other (See Comments)    Personality changes   . Percocet [Oxycodone-Acetaminophen] Other (See Comments)    unknown  . Valium [Diazepam] Other (See Comments)    Personality changes - "in another world" ;  Hallucinations  2015  . Duloxetine Other (See Comments)    REACTION: agitated- weight gain  . Altace [Ramipril] Other (See Comments)    unknown  . Floxin [Ofloxacin] Other (See Comments)    unknown  . Hydromorphone Other (See Comments)    unknown  . Lipitor [Atorvastatin] Other (See Comments)    Muscle weakness   . Lovaza [Omega-3-Acid Ethyl Esters] Other (See Comments)    Muscle weakness   . Pravachol [Pravastatin Sodium] Other (See Comments)    Muscle weakness   . Trilipix [Choline Fenofibrate] Other (See Comments)    Muscle weakness   . Zetia [Ezetimibe] Other (See Comments)    Muscle weakness   . Zocor [Simvastatin] Other (See Comments)    Muscle weakness     Social History   Social History  . Marital Status: Widowed    Spouse Name: N/A  . Number of Children: 2  . Years of Education: HS   Occupational History  . librarian- retired     retired   Social History Main Topics  . Smoking status: Never Smoker   . Smokeless tobacco: Never Used  . Alcohol Use: No     Comment: 01/07/2015 "glass of wine at Christmas,  maybe"  . Drug Use: No  .  Sexual Activity: No   Other Topics Concern  . Not on file   Social History Narrative   Patient is right handed   Patient drinks 1-2 sodas daily.   patlient lives alone.       Family History  Problem Relation Age of Onset  . Coronary artery disease Father   . Peripheral vascular disease Father   . Coronary artery disease Mother   . Coronary artery disease Brother   . Colon cancer Sister   . Emphysema Sister   . Sleep apnea Son   . Colon cancer Sister     spread to her brain  . Dementia Neg Hx   . Emphysema Brother     ROS- All systems are reviewed and negative except as per the HPI above  Physical Exam: Filed Vitals:   02/26/16 1529  BP: 100/56  Pulse: 73  Height: _0  (1.702 m)  Weight: 198 lb 12.8 oz (90.175 kg)    GEN- The patient is chronically ill apperaing, alert and oriented x 3 today.  Walks with a walker. Head- normocephalic, atraumatic Eyes-  Sclera clear, conjunctiva pink Ears- hearing intact Oropharynx- clear Neck- supple, no JVP Lymph- no cervical lymphadenopathy Lungs- Clear to ausculation bilaterally, normal work of breathing Heart- Regular rate and rhythm, no murmurs, rubs or gallops, PMI not laterally displaced GI- soft, NT, ND, + BS Extremities- no clubbing, cyanosis, or edema MS- no significant deformity or atrophy Skin- no rash or lesion Psych- euthymic mood, full affect Neuro- strength and sensation are intact  EKG- SR with PAC's, pr int 144 ms, qrs int 70 ms, qtc 467 ms Epic records reviewed  Assessment and Plan:  1. Afib Pt is in SR today but can't tell a difference in her symptoms when she is in afib vrs SR. She didn't feel any different right after cardioversion even though she converted to SR. She states that she does not want any more meds. She is having difficulty remembering to take what she currently takes. She definitely would not be a tikosyn candidate due to non compliance issues and I don't know  if she would tolerate amiodarone from side effect profile and if she would remember to take correctly.Discussed at length how she was putting herself at risk not taking Eliquis on a regular basis. Daughter will try to keep a better eye on this. We discussed to take at noon and midnight if she could remember these hours better. We also discussed if daughter is concerned re her mother's memory then she should possibly take her back to neurologist for revaluation of dementia.  I will place a 48 hour monitor on pt to check afib burden but I really am concerned if afib is contributing to her symptoms.   Geroge Baseman Carnell Casamento, [facility] [address] [place], [place] [address] [phone]

## 2016-03-03 ENCOUNTER — Ambulatory Visit (INDEPENDENT_AMBULATORY_CARE_PROVIDER_SITE_OTHER): Payer: Medicare HMO

## 2016-03-03 DIAGNOSIS — I480 Paroxysmal atrial fibrillation: Secondary | ICD-10-CM

## 2016-03-03 DIAGNOSIS — M961 Postlaminectomy syndrome, not elsewhere classified: Secondary | ICD-10-CM | POA: Diagnosis not present

## 2016-03-03 DIAGNOSIS — G894 Chronic pain syndrome: Secondary | ICD-10-CM | POA: Diagnosis not present

## 2016-03-03 DIAGNOSIS — M5136 Other intervertebral disc degeneration, lumbar region: Secondary | ICD-10-CM | POA: Diagnosis not present

## 2016-03-03 DIAGNOSIS — M5134 Other intervertebral disc degeneration, thoracic region: Secondary | ICD-10-CM | POA: Diagnosis not present

## 2016-03-10 ENCOUNTER — Telehealth (HOSPITAL_COMMUNITY): Payer: Self-pay | Admitting: Nurse Practitioner

## 2016-03-10 ENCOUNTER — Other Ambulatory Visit (HOSPITAL_COMMUNITY): Payer: Self-pay | Admitting: *Deleted

## 2016-03-10 MED ORDER — RIVAROXABAN 20 MG PO TABS: 20.0000 mg | ORAL_TABLET | Freq: Every day | ORAL | Status: DC

## 2016-03-10 NOTE — Telephone Encounter (Signed)
I called daughter to review holter results. The daughter is noticing personality changes in the mother as being irritable and short tempered. She mentioned this to me somewhat in the clinic but was with her mother and did not feel comfortable expanding on her mood changes. The pt does have a baseline dx of dementia and the daughter has noticed more memory issues and asked the mom if she would go back to neurologist but she refuses.  These personality changes have seemed more evident over the last few months and the daughter read on the Internet that other people on eliquis have also noted "anxiety". She wants to switch her off eliquis to another blood thinner. I told her I did not usually see this with eliquis but we could try to see if it makes a difference. Xarelto may be better for her anyway, because she sleeps until noon and often does not take her am eliquis.  I also believe that she needs to f/u with neurology. Her creat cl calculated is 92.8, so she will qaulify for 20 mg xarelto. I instructed the daughter when her evening dose of eliquis is due, start that xarelto with the supper meal and then it is once a day with supper. She will call me and let me know in 1-2 weeks if it has made a difference.   Otherwise, reviewed the monitor with Dr. Rayann Heman that pt wore x 48 hours. Showed afib, reasonably  rate controlled. Some tachy/brady but nonsustained and pauses up to 3 seconds. Again,not really symptomatic with pauses. She was in SR when I saw her and she was feeling "terrible" and  I don't think she would a good candidate for antiarrythmic, pt does not want any more pills, plus she is not the most reliable to take meds.. And I don't know if she would feel any better in SR. So will continue to rate control.

## 2016-03-19 ENCOUNTER — Ambulatory Visit: Payer: Medicare HMO | Admitting: Family Medicine

## 2016-03-30 ENCOUNTER — Other Ambulatory Visit: Payer: Self-pay | Admitting: *Deleted

## 2016-03-30 ENCOUNTER — Telehealth: Payer: Self-pay | Admitting: *Deleted

## 2016-03-30 DIAGNOSIS — I4819 Other persistent atrial fibrillation: Secondary | ICD-10-CM

## 2016-03-30 MED ORDER — DILTIAZEM HCL ER COATED BEADS 120 MG PO CP24: 120.0000 mg | ORAL_CAPSULE | Freq: Every day | ORAL | Status: DC

## 2016-03-30 NOTE — Telephone Encounter (Signed)
Patient called and requested a refill on eliquis 5mg  to be sent to [facility]. Per note from 03/10/16 she was to switch to xarelto, but she stated that she never started that medication due to cost. Ok to reorder eliquis? Please advise. Thanks, MI

## 2016-03-31 MED ORDER — APIXABAN 5 MG PO TABS: 5.0000 mg | ORAL_TABLET | Freq: Two times a day (BID) | ORAL | Status: DC

## 2016-03-31 NOTE — Telephone Encounter (Signed)
RX sent in for eliquis 5mg  BID.

## 2016-04-20 DIAGNOSIS — L900 Lichen sclerosus et atrophicus: Secondary | ICD-10-CM | POA: Diagnosis not present

## 2016-04-23 ENCOUNTER — Other Ambulatory Visit: Payer: Self-pay

## 2016-04-23 MED ORDER — COLESEVELAM HCL 625 MG PO TABS: 625.0000 mg | ORAL_TABLET | Freq: Two times a day (BID) | ORAL | Status: DC

## 2016-04-27 ENCOUNTER — Telehealth: Payer: Self-pay

## 2016-04-27 NOTE — Telephone Encounter (Signed)
Check with clinical pharmacy about the best substitute for WelChol. And call this in.

## 2016-04-27 NOTE — Telephone Encounter (Signed)
[employer] called and Welchol is non formulary  Formulary are Pravalite, Cholesteromine and Cholestopole

## 2016-04-28 ENCOUNTER — Other Ambulatory Visit: Payer: Self-pay | Admitting: Pharmacist Clinician (PhC)/ Clinical Pharmacy Specialist

## 2016-04-28 MED ORDER — COLESTIPOL HCL 1 G PO TABS: 2.0000 g | ORAL_TABLET | Freq: Two times a day (BID) | ORAL | Status: DC

## 2016-04-28 NOTE — Telephone Encounter (Signed)
Sharyn Lull, please address  Thank you

## 2016-05-08 ENCOUNTER — Other Ambulatory Visit: Payer: Self-pay | Admitting: Family Medicine

## 2016-05-08 ENCOUNTER — Other Ambulatory Visit: Payer: Self-pay

## 2016-05-08 NOTE — Telephone Encounter (Signed)
Called patient her daughter answered and told her no prior auth needed good till 12/20/16 and shouldn't need a new RX because in Jan gave her a printed RX for 90 with 1 refill so should be good till end of July

## 2016-05-11 NOTE — Telephone Encounter (Signed)
x

## 2016-05-14 ENCOUNTER — Other Ambulatory Visit: Payer: Self-pay | Admitting: *Deleted

## 2016-05-14 MED ORDER — ESZOPICLONE 3 MG PO TABS: 3.0000 mg | ORAL_TABLET | Freq: Every day | ORAL | Status: DC

## 2016-05-25 DIAGNOSIS — L900 Lichen sclerosus et atrophicus: Secondary | ICD-10-CM | POA: Diagnosis not present

## 2016-06-02 DIAGNOSIS — M961 Postlaminectomy syndrome, not elsewhere classified: Secondary | ICD-10-CM | POA: Diagnosis not present

## 2016-06-02 DIAGNOSIS — M5134 Other intervertebral disc degeneration, thoracic region: Secondary | ICD-10-CM | POA: Diagnosis not present

## 2016-06-02 DIAGNOSIS — M5136 Other intervertebral disc degeneration, lumbar region: Secondary | ICD-10-CM | POA: Diagnosis not present

## 2016-06-02 DIAGNOSIS — Z79891 Long term (current) use of opiate analgesic: Secondary | ICD-10-CM | POA: Diagnosis not present

## 2016-06-04 ENCOUNTER — Ambulatory Visit (INDEPENDENT_AMBULATORY_CARE_PROVIDER_SITE_OTHER): Payer: Medicare HMO | Admitting: Cardiovascular Disease

## 2016-06-04 ENCOUNTER — Encounter: Payer: Self-pay | Admitting: Cardiovascular Disease

## 2016-06-04 VITALS — BP 118/70 | HR 108 | Ht 67.0 in | Wt 197.8 lb

## 2016-06-04 DIAGNOSIS — I481 Persistent atrial fibrillation: Secondary | ICD-10-CM

## 2016-06-04 DIAGNOSIS — I5032 Chronic diastolic (congestive) heart failure: Secondary | ICD-10-CM

## 2016-06-04 DIAGNOSIS — I4819 Other persistent atrial fibrillation: Secondary | ICD-10-CM

## 2016-06-04 MED ORDER — DILTIAZEM HCL ER COATED BEADS 180 MG PO CP24: 180.0000 mg | ORAL_CAPSULE | Freq: Every day | ORAL | Status: DC

## 2016-06-04 NOTE — Progress Notes (Signed)
Cardiology Office Note Date:  06/04/2016   ID:  [name] [name], DOB [birth year], MRN [id number]  PCP:  Redge Gainer, MD  Cardiologist:  Sherren Mocha, MD    Chief Complaint  Patient presents with  . Coronary Artery Disease  . Hypertension  . Atrial Fibrillation    Paroxysmal   History of Present Illness: [name] [name] is a 75 y.o. female who presents for cardiology follow-up. She has paroxysmal atrial fibrillation, CAD s/p left main stenting in 2015, and chronic pain/immobility from back problems. She is here with her daughter today. Previously followed by Dr Aundra Dubin.   The patient has failed DCCV. She really hasn't felt any different whether in AF or sinus rhythm. Has a lot of complaints and just 'feels bad.' Complains of having no energy. Thinks this is due to 'heart mediction.' She complains of DOE, excessive fatigue as primary issues. She has chronic chest pain with no recent change. No edema, orthopnea, or PND.     Past Medical History  Diagnosis Date  . Obesity   . Circadian rhythm sleep disorder   . CTS (carpal tunnel syndrome)   . Diarrhea   . IBS (irritable bowel syndrome)   . Diverticulitis of colon   . Gastritis   . Esophageal stricture   . Personal history of colonic polyps 10/25/2011 & 12/02/11    not retrieved Dr Lyla Son & tubular adenomas  . Hiatal hernia   . GERD (gastroesophageal reflux disease)   . Insomnia   . HTN (hypertension)   . Hyperlipidemia   . Depression   . Chronic diastolic CHF (congestive heart failure) ([place])   . Memory disorder 12/04/2014  . Pneumonia 03/2014  . Obstructive sleep apnea     "have mask; don't wear it" (01/07/2015)  . Anemia   . History of blood transfusion     "most of them related to OR's"   . MGUS (monoclonal gammopathy of unknown significance) dx'd 11/2014    a. Neg BMB 11/2014.  . Stroke [facility]) early 2000's    "small"; denies residual on 01/07/2015)  . Arthritis     "knees, back, fingers, toes; joints"  (01/07/2015)  . Chronic back pain greater than 3 months duration     a. spinal stenosis. Spinal fusion with rods in 2/15 at [facility] spinal fusion 9/15.  Marland Kitchen Anxiety   . CAD (coronary artery disease)     a. Abnl nuc 11/2014. Cath 12/2014 - turned down for CABG. Ultimately s/p TTVP, rotational atherectomy, PTCA and stenting of the ostial LCx and left main into the LAD (crush technique), and IVUS of the LAD/Left main.  Marland Kitchen PAF (paroxysmal atrial fibrillation) ([place])     a.  Paroxysmal. Has only been noted post-op 9/15 spinal fusion. She had cardioversion, not on anticoagulation. Fall risk, unsteady.  . Sinus bradycardia     a. Baseline HR 50s-60s.  Marland Kitchen PAT (paroxysmal atrial tachycardia) ([place])   . Multiple falls   . Orthostasis   . History of echocardiogram     a. Echo 2/17:  EF 50-55%, trivial AI, midl MR, mod LAE, PASP 37 mmHg    Past Surgical History  Procedure Laterality Date  . Carpal tunnel release Right 1980's  . Total knee arthroplasty Bilateral 1990's - 2000's  . Cataract extraction Bilateral   . Left heart catheterization with coronary angiogram N/A 12/27/2014    Procedure: LEFT HEART CATHETERIZATION WITH CORONARY ANGIOGRAM;  Surgeon: Larey Dresser, MD;  Location: [facility] CATH LAB;  Service:  Cardiovascular;  Laterality: N/A;  . Percutaneous coronary rotoblator intervention (pci-r)  01/07/2015  . Cardiac catheterization  12/27/2014    Procedure: INTRAVASCULAR PRESSURE WIRE/FFR STUDY;  Surgeon: Larey Dresser, MD;  Location: [facility] CATH LAB;  Service: Cardiovascular;;  . Coronary angioplasty with stent placement  01/07/2015    "2"  . Laparoscopic cholecystectomy  2003  . Joint replacement    . Knee arthroscopy Left 1995  . Back surgery    . Posterior lumbar fusion  01/2015  . Posterior fusion thoracic spine  08/2015  . Dilation and curettage of uterus    . Cataract extraction w/ intraocular lens  implant, bilateral  2000's  . Esophagogastroduodenoscopy (egd) with esophageal dilation   "several times"  . Bone marrow biopsy  11/2014  . Percutaneous coronary rotoblator intervention (pci-r) N/A 01/07/2015    Procedure: PERCUTANEOUS CORONARY ROTOBLATOR INTERVENTION (PCI-R);  Surgeon: Blane Ohara, MD;  Location: [facility] CATH LAB;  Service: Cardiovascular;  Laterality: N/A;  . Coronary stent placement    . Cardioversion N/A 02/13/2016    Procedure: CARDIOVERSION;  Surgeon: Josue Hector, MD;  Location: [facility] ENDOSCOPY;  Service: Cardiovascular;  Laterality: N/A;    Current Outpatient Prescriptions  Medication Sig Dispense Refill  . apixaban (ELIQUIS) 5 MG TABS tablet Take 1 tablet (5 mg total) by mouth 2 (two) times daily. 180 tablet 3  . Calcium Citrate-Vitamin D (CALCIUM + D PO) Take 1 tablet by mouth daily.    . Cholecalciferol (VITAMIN D) 2000 units tablet Take 2,000 Units by mouth daily.    . colestipol (COLESTID) 1 g tablet Take 2 tablets (2 g total) by mouth 2 (two) times daily. 120 tablet 6  . diltiazem (CARDIZEM CD) 120 MG 24 hr capsule Take 1 capsule (120 mg total) by mouth daily. 90 capsule 3  . diphenoxylate-atropine (LOMOTIL) 2.5-0.025 MG per tablet Take 1 tablet by mouth 4 (four) times daily as needed for diarrhea or loose stools.     . Eszopiclone 3 MG TABS Take 1 tablet (3 mg total) by mouth at bedtime. For insomnia,take immediately before bedtime. 90 tablet 0  . fentaNYL (DURAGESIC - DOSED MCG/HR) 50 MCG/HR Place 1 patch (50 mcg total) onto the skin every 3 (three) days. 10 patch 0  . FLUoxetine (PROZAC) 40 MG capsule Take 1 capsule by mouth  daily for depression 90 capsule 3  . furosemide (LASIX) 40 MG tablet Take 20 mg by mouth daily.    Marland Kitchen HYDROcodone-acetaminophen (NORCO) 10-325 MG per tablet Take 1 tablet by mouth every 4 (four) hours as needed for moderate pain.     . iron polysaccharides (NIFEREX) 150 MG capsule Take 1 capsule (150 mg total) by mouth daily. 90 capsule 3  . LORazepam (ATIVAN) 0.5 MG tablet Take 1 tablet (0.5 mg total) by mouth 2 (two) times daily  as needed for anxiety. 60 tablet 1  . meclizine (ANTIVERT) 25 MG tablet Take 1 tablet (25 mg total) by mouth 3 (three) times daily as needed for dizziness. 60 tablet 0  . methocarbamol (ROBAXIN) 500 MG tablet Take 500 mg by mouth 4 (four) times daily as needed for muscle spasms.    . Multiple Vitamin (MULTIVITAMIN) tablet Take 1 tablet by mouth daily. For supplement    . nitroGLYCERIN (NITROSTAT) 0.4 MG SL tablet Place 1 tablet (0.4 mg total) under the tongue every 5 (five) minutes as needed for chest pain (up to 3 doses). 25 tablet 3  . pantoprazole (PROTONIX) 40 MG tablet Take 1  tablet (40 mg total) by mouth 2 (two) times daily. 180 tablet 3  . rosuvastatin (CRESTOR) 20 MG tablet Take 20 mg by mouth. Patient alternating taking 1 whole tablet (20 mg) by mouth once daily and 1/2 tablet (10 mg ) by mouth once daily the next day.    Marland Kitchen NARCAN 4 MG/0.1ML LIQD Place 1 spray into the nose as directed. For drug overdose     No current facility-administered medications for this visit.    Allergies:   Cymbalta; Morphine and related; Oxycodone-acetaminophen; Oxycodone-acetaminophen; Percocet; Valium; Duloxetine; Altace; Floxin; Hydromorphone; Lipitor; Lovaza; Pravachol; Trilipix; Zetia; and Zocor   Social History:  The patient  reports that she has never smoked. She has never used smokeless tobacco. She reports that she does not drink alcohol or use illicit drugs.   Family History:  The patient's  family history includes Colon cancer in her sister and sister; Coronary artery disease in her brother, father, and mother; Emphysema in her brother and sister; Peripheral vascular disease in her father; Sleep apnea in her son. There is no history of Dementia.    ROS:  Please see the history of present illness.  Otherwise, review of systems is positive for diarrhea, back pain, anxiety, balance problems.  All other systems are reviewed and negative.    PHYSICAL EXAM: VS:  BP 118/70 mmHg  Pulse 108  Ht 5' 7"   (1.702 m)  Wt 197 lb 12.8 oz (89.721 kg)  BMI 30.97 kg/m2 , BMI Body mass index is 30.97 kg/(m^2). GEN: Well nourished, well developed, in no acute distress HEENT: normal Neck: no JVD, no masses. No carotid bruits Cardiac: irregularly irregular without murmur or gallop          Respiratory:  clear to auscultation bilaterally, normal work of breathing GI: soft, nontender, nondistended, + BS MS: no deformity or atrophy Ext: no pretibial edema, pedal pulses 2+= bilaterally Skin: warm and dry, no rash Neuro:  Strength and sensation are intact Psych: euthymic mood, full affect  EKG:  EKG is ordered today. The ekg ordered today shows Atrial fibrillation with RVR, HR 111 bpm  Recent Labs: 08/15/2015: BNP 43.7 01/16/2016: Hemoglobin 13.0; TSH 1.690 02/18/2016: ALT 17; BUN 10; Creatinine, Ser 0.72; Platelets 181; Potassium 4.3; Sodium 141   Lipid Panel     Component Value Date/Time   CHOL 119 02/18/2016 1504   CHOL 158 01/03/2015 1310   CHOL 156 05/11/2013 1536   TRIG 121 02/18/2016 1504   TRIG 171* 01/03/2015 1310   TRIG 239* 05/11/2013 1536   HDL 57 02/18/2016 1504   HDL 61 01/03/2015 1310   HDL 43 05/11/2013 1536   CHOLHDL 2.6 01/03/2015 1310   LDLCALC 63 01/03/2015 1310   LDLCALC 50 08/21/2014 1141   LDLCALC 65 05/11/2013 1536      Wt Readings from Last 3 Encounters:  06/04/16 197 lb 12.8 oz (89.721 kg)  02/26/16 198 lb 12.8 oz (90.175 kg)  02/20/16 195 lb (88.451 kg)     Cardiac Studies Reviewed: 2D Echo 01-29-2016: Study Conclusions  - Left ventricle: Systolic function was normal. The estimated  ejection fraction was in the range of 50% to 55%. - Aortic valve: There was trivial regurgitation. - Mitral valve: There was mild regurgitation. - Left atrium: The atrium was moderately dilated. - Atrial septum: No defect or patent foramen ovale was identified. - Pulmonary arteries: PA peak pressure: 37 mm Hg (S). Cardiac Cath January 2016: Procedure: Temporary transvenous  pacemaker placement, rotational atherectomy, PTCA and stenting  of the ostial LCx and left main into the LAD (crush technique), and IVUS of the LAD/Left main  Indication: CCS Class 3 angina with severe left main/LCx stenosis, moderate risk Myoview, turned down for CABG because of very poor functional capacity. Previous FFR 0.66 LCx and 0.82 LAD  Procedural Details: The right groin was prepped, draped, and anesthetized with 1% lidocaine. Using the modified Seldinger technique, a 7 Fr sheath was introduced into the right femoral artery. A 6 Fr sheath was introduced into the right femoral vein. A balloontipped temporary transvenous pacemaker was inserted into the right ventricular apex. The patient had been adequately pretreated with aspirin and clopidogrel. Weight-based bivalirudin was given for anticoagulation. Once a therapeutic ACT was achieved, a 7 [place] XB 3.0 cm guide catheter was inserted. A long cougar coronary guidewire was used to cross the lesion and was advanced into the distal left circumflex. This was changed out for a Rota-floppy wire over an over-the-wire balloon. Rotational atherectomy was then performed with a 1.5 mm bur. The patient tolerated this well. The Rota-floppy wire was again changed out back to the long cougar wire. The lesion was predilated with a 2.5 mm balloon. A short cougar wire was then advanced into the LAD. Because of the patient's anatomic considerations, I felt that mini-crush would the best bifurcational technique. She had severe calcification and ostial stenosis of the left circumflex. There was significant vessel mismatch from her large left mainstem compared with the left circumflex. A 2.75 x 12 mm Synergy DES was positioned across the LCx ostium. A 4.0x16 mm Synergy DES was positioned from the proximal LAD back to the mid shaft of the left mainstem. The circumflex stent was first deployed at 12 atm. The stent balloon was then pulled back and the LAD/left main stent  was deployed at 14 atm. The trapped circumflex wire was left in place. A whisper wire was used to rewire the circumflex. The trapped cougar wire and the circumflex was then removed. The stent struts were dilated into the circumflex with a 1.5 mm balloon. A 3.0 mm noncompliant balloon was then advanced from the left mainstem into the circumflex box. A 4.0 mm noncompliant balloon was advanced from the left mainstem into the LAD. Simultaneous kissing balloons were inflated in both vessels extending back into the left main. The patient tolerated this well. The angiographic result was excellent with 0% residual stenosis at the left main site in the left circumflex site. Intravascular ultrasound was then performed from the LAD back through the left mainstem using mechanical pullback. This confirmed good stent apposition and expansion throughout. Final angiographic images were taken. The patient tolerated the procedure well. Femoral hemostasis was achieved with a Perclose device. Initially the device appeared to achieve hemostasis. However, after a few minutes of manual pressure to control tract oozing, there was more pulsatile flow noted. Device failure was suspected. A Femostop was placed. The patient was transferred to the post catheterization recovery area for further monitoring.  Lesion Data: Lesion 1 Vessel: LCx/ostium Percent stenosis (pre): 80 TIMI-flow (pre): 3 Stent: 2.75x12 mm Synergy DES Percent stenosis (post): 0 TIMI-flow (post): 3  Lesion 2 Vessel: Left main/distal Percent stenosis (pre): 70 TIMI-flow (pre): 3 Stent: 4.0 x 16 mm Synergy DES Percent stenosis (post): 0 TIMI-flow (post): 3 Contrast: 135 cc Omnipaque  Radiation dose/Fluoro time: 22.2 minutes  Estimated Blood Loss: minimal  Complications: failed femoral artery closure device (Femstop placed)  Conclusions:  Successful complex bifurcation stenting of the distal left main and  ostial circumflex with adjunctive  rotational atherectomy, guided by intravascular ultrasound  Recommendations: DAPT at least 12 months and favor long-term as tolerated  ASSESSMENT AND PLAN: 1.  Atrial fibrillation with RVR: anticoagulated with Eliquis. Will increase cardizem to 180 mg daily for better rate control. The patient seems confused about her medications. I stressed the importance of medication adherence with them today. Probably no role to try to restore sinus rhythm.   2. CAD: She continues on Eliquis, off of antiplatelet Rx. Continue statin  3. Chronic diastolic CHF: volume status looks ok today. Continue same Rx.   4. HTN: BP well-controlled  Current medicines are reviewed with the patient today.  The patient does not have concerns regarding medicines.  Labs/ tests ordered today include:  No orders of the defined types were placed in this encounter.    Disposition:   FU 2 months Richardson Dopp, PA-C. I will plan to see her back in 6 months.  Deatra James, MD  06/04/2016 3:28 PM    [facility] [place], [place], [place]  [address] Phone: [phone]; Fax: [phone]

## 2016-06-04 NOTE — Patient Instructions (Addendum)
Medication Instructions:  Your physician has recommended you make the following change in your medication:  1. INCREASE Cardizem to 180mg  take one tablet by mouth daily  Labwork: No new orders.   Testing/Procedures: No new orders.   Follow-Up: Your physician recommends that you schedule a follow-up appointment in: 2 MONTHS with Richardson Dopp PA-C  Your physician wants you to follow-up in: 6 MONTHS with Dr Burt Knack.  You will receive a reminder letter in the mail two months in advance. If you don't receive a letter, please call our office to schedule the follow-up appointment.   Any Other Special Instructions Will Be Listed Below (If Applicable).     If you need a refill on your cardiac medications before your next appointment, please call your pharmacy.

## 2016-06-22 ENCOUNTER — Encounter: Payer: Self-pay | Admitting: Family Medicine

## 2016-06-22 ENCOUNTER — Ambulatory Visit (INDEPENDENT_AMBULATORY_CARE_PROVIDER_SITE_OTHER): Payer: Medicare HMO | Admitting: Family Medicine

## 2016-06-22 VITALS — BP 122/90 | HR 59 | Temp 99.4°F | Ht 67.0 in | Wt 197.0 lb

## 2016-06-22 DIAGNOSIS — E785 Hyperlipidemia, unspecified: Secondary | ICD-10-CM | POA: Diagnosis not present

## 2016-06-22 DIAGNOSIS — E559 Vitamin D deficiency, unspecified: Secondary | ICD-10-CM

## 2016-06-22 DIAGNOSIS — I480 Paroxysmal atrial fibrillation: Secondary | ICD-10-CM

## 2016-06-22 DIAGNOSIS — D509 Iron deficiency anemia, unspecified: Secondary | ICD-10-CM | POA: Diagnosis not present

## 2016-06-22 DIAGNOSIS — M549 Dorsalgia, unspecified: Secondary | ICD-10-CM | POA: Diagnosis not present

## 2016-06-22 DIAGNOSIS — I481 Persistent atrial fibrillation: Secondary | ICD-10-CM

## 2016-06-22 DIAGNOSIS — Z1211 Encounter for screening for malignant neoplasm of colon: Secondary | ICD-10-CM

## 2016-06-22 DIAGNOSIS — I4819 Other persistent atrial fibrillation: Secondary | ICD-10-CM

## 2016-06-22 DIAGNOSIS — I10 Essential (primary) hypertension: Secondary | ICD-10-CM

## 2016-06-22 DIAGNOSIS — K219 Gastro-esophageal reflux disease without esophagitis: Secondary | ICD-10-CM | POA: Diagnosis not present

## 2016-06-22 DIAGNOSIS — G8929 Other chronic pain: Secondary | ICD-10-CM | POA: Diagnosis not present

## 2016-06-22 NOTE — Progress Notes (Signed)
Subjective:    Patient ID: [name] [name], female    DOB: 05/31/1941, 75 y.o.   MRN: [id number]  HPI Pt here for follow up and management of chronic medical problems which includes hypertension and hyperlipidemia. She is taking medications regularly.The patient is doing well overall. She continues to have her chronic back pain. She also sees a cardiologist regularly and had a cardioversion back in the winter. She still has atrial fibrillation. This was noted after reviewing the notes from the other doctors. She is allergic to many medications. He is taking Eliquis and does tolerate Crestor. She continues to hurt a lot especially in her lower back but especially more in her upper back and across her shoulders. She does see the neurosurgeon regularly and a cardiologist regularly. She appears to be somewhat depressed and tearful today because of all the stuff she is having to deal with. She denies any chest pain or shortness of breath. She denies any trouble with swallowing heartburn indigestion nausea vomiting diarrhea or blood in the stool. She does not have any trouble with passing her water. She also has a follow-up visit scheduled with neurosurgery because of all the multiple back surgeries.     Patient Active Problem List   Diagnosis Date Noted  . Chronic diastolic CHF (congestive heart failure) ([place]) 07/17/2015  . Anemia, iron deficiency 07/02/2015  . DDD (degenerative disc disease), lumbar 03/22/2015  . Fall 03/22/2015  . 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 Difficulty hearing 03/22/2015  . Adaptive colitis 03/22/2015  . Lichen 62/83/6629  . Lumbar scoliosis 03/22/2015  . Fracture of pelvis ([place]) 03/22/2015  . Herpes zona 03/22/2015  . History of other specified conditions presenting hazards to health 03/22/2015  . Anemia 01/08/2015  . MGUS (monoclonal gammopathy of unknown significance) 01/08/2015  . Ischemic chest pain ([place])   . Paroxysmal atrial fibrillation ([place]) 12/22/2014  . CAD  (coronary artery disease) 12/22/2014  . Memory disorder 12/04/2014  . Deficiency anemia 11/10/2014  . Vertigo 10/27/2014  . Angulation of spine 09/18/2014  . Metabolic syndrome 47/65/4650  . HCAP (healthcare-associated pneumonia) 03/27/2014  . S/P spinal fusion 02/23/2014  . Chronic pain associated with significant psychosocial dysfunction 02/23/2014  . Preoperative evaluation of a medical condition to rule out surgical contraindications (TAR required) 10/09/2013  . Abdominal pain 07/06/2013  . Gastroesophageal hernia 07/06/2013  . Incontinence 10/13/2012  . History of IBS 01/26/2012  . 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 EDEMA 06/25/2010  . DEGENERATIVE JOINT DISEASE 04/08/2010  . OTHER DISORDERS OF NERVOUS SYSTEM&SENSE ORGANS 04/08/2010  . Obesity (BMI 30.0-34.9) 04/04/2009  . CIRCADIAN RHYTHM SLEEP D/O DELAY SLEEP PHSE TYPE 04/04/2009  . CARPAL TUNNEL SYNDROME 04/04/2009  . Diarrhea 01/17/2009  . IRRITABLE BOWEL SYNDROME 11/13/2008  . ESOPHAGEAL STRICTURE 11/12/2008  . GERD 11/12/2008  . DUODENITIS, WITHOUT HEMORRHAGE 11/12/2008  . Diaphragmatic hernia without mention of obstruction or gangrene 11/12/2008  . DIVERTICULOSIS, COLON 11/12/2008  . COLONIC POLYPS, HX OF 11/12/2008  . Obstructive sleep apnea 05/23/2008  . DYSPNEA 05/23/2008  . Hyperlipidemia 05/22/2008  . Depression 05/22/2008  . Essential hypertension 05/22/2008  . INSOMNIA 05/22/2008   Outpatient Encounter Prescriptions as of 06/22/2016  Medication Sig  . apixaban (ELIQUIS) 5 MG TABS tablet Take 1 tablet (5 mg total) by mouth 2 (two) times daily.  . Calcium Citrate-Vitamin D (CALCIUM + D PO) Take 1 tablet by mouth daily.  . Cholecalciferol (VITAMIN D) 2000 units tablet Take 2,000 Units by mouth daily.  . colestipol (COLESTID) 1 g  tablet Take 2 tablets (2 g total) by mouth 2 (two) times daily.  Marland Kitchen diltiazem (CARDIZEM CD) 180 MG 24 hr capsule Take 1 capsule (180 mg total) by mouth daily.  .  diphenoxylate-atropine (LOMOTIL) 2.5-0.025 MG per tablet Take 1 tablet by mouth 4 (four) times daily as needed for diarrhea or loose stools.   . Eszopiclone 3 MG TABS Take 1 tablet (3 mg total) by mouth at bedtime. For insomnia,take immediately before bedtime.  . fentaNYL (DURAGESIC - DOSED MCG/HR) 50 MCG/HR Place 1 patch (50 mcg total) onto the skin every 3 (three) days.  Marland Kitchen FLUoxetine (PROZAC) 40 MG capsule Take 1 capsule by mouth  daily for depression  . furosemide (LASIX) 40 MG tablet Take 20 mg by mouth daily.  Marland Kitchen HYDROcodone-acetaminophen (NORCO) 10-325 MG per tablet Take 1 tablet by mouth every 4 (four) hours as needed for moderate pain.   . iron polysaccharides (NIFEREX) 150 MG capsule Take 1 capsule (150 mg total) by mouth daily.  Marland Kitchen LORazepam (ATIVAN) 0.5 MG tablet Take 1 tablet (0.5 mg total) by mouth 2 (two) times daily as needed for anxiety.  . meclizine (ANTIVERT) 25 MG tablet Take 1 tablet (25 mg total) by mouth 3 (three) times daily as needed for dizziness.  . methocarbamol (ROBAXIN) 500 MG tablet Take 500 mg by mouth 4 (four) times daily as needed for muscle spasms.  . Multiple Vitamin (MULTIVITAMIN) tablet Take 1 tablet by mouth daily. For supplement  . NARCAN 4 MG/0.1ML LIQD Place 1 spray into the nose as directed. For drug overdose  . nitroGLYCERIN (NITROSTAT) 0.4 MG SL tablet Place 1 tablet (0.4 mg total) under the tongue every 5 (five) minutes as needed for chest pain (up to 3 doses).  . pantoprazole (PROTONIX) 40 MG tablet Take 1 tablet (40 mg total) by mouth 2 (two) times daily.  . rosuvastatin (CRESTOR) 20 MG tablet Take 20 mg by mouth. Patient alternating taking 1 whole tablet (20 mg) by mouth once daily and 1/2 tablet (10 mg ) by mouth once daily the next day.   No facility-administered encounter medications on file as of 06/22/2016.      Review of Systems  Constitutional: Negative.   HENT: Negative.   Eyes: Negative.   Respiratory: Negative.   Cardiovascular: Negative.    Gastrointestinal: Negative.   Endocrine: Negative.   Genitourinary: Negative.   Musculoskeletal: Negative.   Skin: Negative.   Allergic/Immunologic: Negative.   Neurological: Negative.   Hematological: Negative.   Psychiatric/Behavioral: Negative.        Objective:   Physical Exam  Constitutional: She is oriented to person, place, and time. She appears well-developed and well-nourished. She appears distressed.  HENT:  Head: Normocephalic and atraumatic.  Right Ear: External ear normal.  Left Ear: External ear normal.  Nose: Nose normal.  Mouth/Throat: Oropharynx is clear and moist.  Eyes: Conjunctivae and EOM are normal. Pupils are equal, round, and reactive to light. Right eye exhibits no discharge. Left eye exhibits no discharge. No scleral icterus.  Neck: Normal range of motion. Neck supple. No thyromegaly present.  No bruits or thyromegaly or adenopathy  Cardiovascular: Normal rate, normal heart sounds and intact distal pulses.   No murmur heard. The heart is in an irregular irregular rhythm at 84-96/m  Pulmonary/Chest: Effort normal and breath sounds normal. No respiratory distress. She has no wheezes. She has no rales. She exhibits no tenderness.  Clear anteriorly and posteriorly  Abdominal: Soft. Bowel sounds are normal. She exhibits no mass.  There is tenderness. There is no rebound and no guarding.  Generalized upper abdominal tenderness left upper quadrant greater than right upper quadrant. No liver or spleen enlargement no masses no bruits and no inguinal adenopathy  Musculoskeletal: She exhibits tenderness. She exhibits no edema.  Limited range of motion secondary to multiple surgeries on the upper and lower back with follow-up visit scheduled with neurosurgery  Lymphadenopathy:    She has no cervical adenopathy.  Neurological: She is alert and oriented to person, place, and time.  Skin: Skin is warm and dry. No rash noted.  Psychiatric: Her behavior is normal.  Judgment and thought content normal.  Mood is somewhat depressed and this is similar to usual area  Nursing note and vitals reviewed.  BP 122/90 mmHg  Pulse 59  Temp(Src) 99.4 F (37.4 C) (Oral)  Ht 5' 7"  (1.702 m)  Wt 197 lb (89.359 kg)  BMI 30.85 kg/m2        Assessment & Plan:  1. Paroxysmal atrial fibrillation (HCC) -Continue to follow-up with cardiology in atrial fibrillation clinic - CBC with Differential/Platelet  2. Vitamin D deficiency -Continue current treatment pending results of lab work - CBC with Differential/Platelet - VITAMIN D 25 Hydroxy (Vit-D Deficiency, Fractures)  3. Essential hypertension -The blood pressure was borderline today. On repeat in the left arm sitting manually it was 150/88. Initially it was 122/90. No change in treatment. - BMP8+EGFR - CBC with Differential/Platelet - Hepatic function panel  4. Hyperlipemia -Continue Crestor and as aggressive therapeutic lifestyle changes as possible - CBC with Differential/Platelet - Lipid panel  5. Gastroesophageal reflux disease, esophagitis presence not specified -Continue with current treatment - CBC with Differential/Platelet - Hepatic function panel  6. Anemia, iron deficiency - CBC with Differential/Platelet  7. Special screening for malignant neoplasms, colon - Fecal occult blood, imunochemical; Future  8. Chronic back pain greater than 3 months duration -Continue follow-up with neurosurgery in current pain management  9. Persistent atrial fibrillation ([place]) -Continue cardiology follow-up and current treatment as per their recommendations  Patient Instructions                       Medicare Annual Wellness Visit  [facility] and the medical providers at [facility] strive to bring you the best medical care.  In doing so we not only want to address your current medical conditions and concerns but also to detect new conditions early and prevent illness, disease  and health-related problems.    Medicare offers a yearly Wellness Visit which allows our clinical staff to assess your need for preventative services including immunizations, lifestyle education, counseling to decrease risk of preventable diseases and screening for fall risk and other medical concerns.    This visit is provided free of charge (no copay) for all Medicare recipients. The clinical pharmacists at [facility] have begun to conduct these Wellness Visits which will also include a thorough review of all your medications.    As you primary medical provider recommend that you make an appointment for your Annual Wellness Visit if you have not done so already this year.  You may set up this appointment before you leave today or you may call back [phone]) and schedule an appointment.  Please make sure when you call that you mention that you are scheduling your Annual Wellness Visit with the clinical pharmacist so that the appointment may be made for the proper length of time.  Continue current medications. Continue good therapeutic lifestyle changes which include good diet and exercise. Fall precautions discussed with patient. If an FOBT was given today- please return it to our front desk. If you are over 85 years old - you may need Prevnar 41 or the adult Pneumonia vaccine.  **Flu shots are available--- please call and schedule a FLU-CLINIC appointment**  After your visit with [place] today you will receive a survey in the mail or online from [employer] regarding your care with [place]. Please take a moment to fill this out. Your feedback is very important to [place] as you can help [place] better understand your patient needs as well as improve your experience and satisfaction. WE CARE ABOUT YOU!!!   The patient should continue to follow-up with neurosurgery and cardiology She should continue to be careful not put herself at risk for falling She should use her cane and/or a  walker   Arrie Senate MD

## 2016-06-22 NOTE — Patient Instructions (Addendum)
Medicare Annual Wellness Visit  [facility] and the medical providers at [facility] strive to bring you the best medical care.  In doing so we not only want to address your current medical conditions and concerns but also to detect new conditions early and prevent illness, disease and health-related problems.    Medicare offers a yearly Wellness Visit which allows our clinical staff to assess your need for preventative services including immunizations, lifestyle education, counseling to decrease risk of preventable diseases and screening for fall risk and other medical concerns.    This visit is provided free of charge (no copay) for all Medicare recipients. The clinical pharmacists at [facility] have begun to conduct these Wellness Visits which will also include a thorough review of all your medications.    As you primary medical provider recommend that you make an appointment for your Annual Wellness Visit if you have not done so already this year.  You may set up this appointment before you leave today or you may call back [phone] and schedule an appointment.  Please make sure when you call that you mention that you are scheduling your Annual Wellness Visit with the clinical pharmacist so that the appointment may be made for the proper length of time.     Continue current medications. Continue good therapeutic lifestyle changes which include good diet and exercise. Fall precautions discussed with patient. If an FOBT was given today- please return it to our front desk. If you are over 21 years old - you may need Prevnar 41 or the adult Pneumonia vaccine.  **Flu shots are available--- please call and schedule a FLU-CLINIC appointment**  After your visit with [place] today you will receive a survey in the mail or online from [employer] regarding your care with [place]. Please take a moment to fill this out. Your feedback is very  important to [place] as you can help [place] better understand your patient needs as well as improve your experience and satisfaction. WE CARE ABOUT YOU!!!   The patient should continue to follow-up with neurosurgery and cardiology She should continue to be careful not put herself at risk for falling She should use her cane and/or a walker

## 2016-06-23 LAB — HEPATIC FUNCTION PANEL
ALT: 20 IU/L (ref 0–32)
AST: 21 IU/L (ref 0–40)
Albumin: 4.2 g/dL (ref 3.5–4.8)
Alkaline Phosphatase: 103 IU/L (ref 39–117)
BILIRUBIN TOTAL: 0.4 mg/dL (ref 0.0–1.2)
BILIRUBIN, DIRECT: 0.17 mg/dL (ref 0.00–0.40)
TOTAL PROTEIN: 6.4 g/dL (ref 6.0–8.5)

## 2016-06-23 LAB — BMP8+EGFR
BUN / CREAT RATIO: 18 (ref 12–28)
BUN: 17 mg/dL (ref 8–27)
CO2: 27 mmol/L (ref 18–29)
CREATININE: 0.92 mg/dL (ref 0.57–1.00)
Calcium: 9.8 mg/dL (ref 8.7–10.3)
Chloride: 97 mmol/L (ref 96–106)
GFR calc Af Amer: 70 mL/min/{1.73_m2} (ref >59)
GFR calc non Af Amer: 61 mL/min/{1.73_m2} (ref >59)
GLUCOSE: 106 mg/dL — AB (ref 65–99)
POTASSIUM: 4.1 mmol/L (ref 3.5–5.2)
SODIUM: 141 mmol/L (ref 134–144)

## 2016-06-23 LAB — CBC WITH DIFFERENTIAL/PLATELET
BASOS: 0 %
Basophils Absolute: 0 10*3/uL (ref 0.0–0.2)
EOS (ABSOLUTE): 0.1 10*3/uL (ref 0.0–0.4)
EOS: 2 %
HEMATOCRIT: 41.2 % (ref 34.0–46.6)
Hemoglobin: 13.3 g/dL (ref 11.1–15.9)
Immature Grans (Abs): 0 10*3/uL (ref 0.0–0.1)
Immature Granulocytes: 0 %
LYMPHS ABS: 1.8 10*3/uL (ref 0.7–3.1)
Lymphs: 27 %
MCH: 29.5 pg (ref 26.6–33.0)
MCHC: 32.3 g/dL (ref 31.5–35.7)
MCV: 91 fL (ref 79–97)
MONOS ABS: 0.6 10*3/uL (ref 0.1–0.9)
Monocytes: 9 %
Neutrophils Absolute: 4 10*3/uL (ref 1.4–7.0)
Neutrophils: 62 %
Platelets: 206 10*3/uL (ref 150–379)
RBC: 4.51 x10E6/uL (ref 3.77–5.28)
RDW: 13.8 % (ref 12.3–15.4)
WBC: 6.6 10*3/uL (ref 3.4–10.8)

## 2016-06-23 LAB — LIPID PANEL
CHOL/HDL RATIO: 2 ratio (ref 0.0–4.4)
Cholesterol, Total: 143 mg/dL (ref 100–199)
HDL: 73 mg/dL (ref >39)
LDL CALC: 39 mg/dL (ref 0–99)
TRIGLYCERIDES: 157 mg/dL — AB (ref 0–149)
VLDL Cholesterol Cal: 31 mg/dL (ref 5–40)

## 2016-06-23 LAB — THYROID PANEL WITH TSH
FREE THYROXINE INDEX: 2.1 (ref 1.2–4.9)
T3 UPTAKE RATIO: 29 % (ref 24–39)
T4 TOTAL: 7.2 ug/dL (ref 4.5–12.0)
TSH: 4.34 u[IU]/mL (ref 0.450–4.500)

## 2016-06-23 LAB — VITAMIN D 25 HYDROXY (VIT D DEFICIENCY, FRACTURES): VIT D 25 HYDROXY: 37.4 ng/mL (ref 30.0–100.0)

## 2016-06-24 DIAGNOSIS — L900 Lichen sclerosus et atrophicus: Secondary | ICD-10-CM | POA: Diagnosis not present

## 2016-06-29 ENCOUNTER — Telehealth: Payer: Self-pay | Admitting: *Deleted

## 2016-06-29 MED ORDER — COLESTIPOL HCL 1 G PO TABS: 2.0000 g | ORAL_TABLET | Freq: Two times a day (BID) | ORAL | Status: DC

## 2016-06-29 NOTE — Telephone Encounter (Signed)
done

## 2016-07-02 ENCOUNTER — Telehealth: Payer: Self-pay | Admitting: Hematology

## 2016-07-02 ENCOUNTER — Other Ambulatory Visit (HOSPITAL_BASED_OUTPATIENT_CLINIC_OR_DEPARTMENT_OTHER): Payer: Medicare HMO

## 2016-07-02 ENCOUNTER — Encounter: Payer: Self-pay | Admitting: Hematology

## 2016-07-02 ENCOUNTER — Ambulatory Visit (HOSPITAL_BASED_OUTPATIENT_CLINIC_OR_DEPARTMENT_OTHER): Payer: Medicare HMO | Admitting: Hematology

## 2016-07-02 VITALS — BP 127/85 | HR 105 | Temp 98.9°F | Resp 17 | Ht 67.0 in | Wt 199.3 lb

## 2016-07-02 DIAGNOSIS — D509 Iron deficiency anemia, unspecified: Secondary | ICD-10-CM

## 2016-07-02 DIAGNOSIS — I10 Essential (primary) hypertension: Secondary | ICD-10-CM

## 2016-07-02 DIAGNOSIS — I4891 Unspecified atrial fibrillation: Secondary | ICD-10-CM

## 2016-07-02 DIAGNOSIS — D472 Monoclonal gammopathy: Secondary | ICD-10-CM | POA: Diagnosis not present

## 2016-07-02 DIAGNOSIS — F419 Anxiety disorder, unspecified: Secondary | ICD-10-CM

## 2016-07-02 DIAGNOSIS — M25551 Pain in right hip: Secondary | ICD-10-CM

## 2016-07-02 DIAGNOSIS — F0390 Unspecified dementia without behavioral disturbance: Secondary | ICD-10-CM

## 2016-07-02 LAB — FERRITIN: Ferritin: 142 ng/ml (ref 9–269)

## 2016-07-02 LAB — CBC WITH DIFFERENTIAL/PLATELET
BASO%: 0.3 % (ref 0.0–2.0)
Basophils Absolute: 0 10*3/uL (ref 0.0–0.1)
EOS ABS: 0.2 10*3/uL (ref 0.0–0.5)
EOS%: 2.1 % (ref 0.0–7.0)
HEMATOCRIT: 41.9 % (ref 34.8–46.6)
HEMOGLOBIN: 13.9 g/dL (ref 11.6–15.9)
LYMPH%: 23.2 % (ref 14.0–49.7)
MCH: 30.3 pg (ref 25.1–34.0)
MCHC: 33.2 g/dL (ref 31.5–36.0)
MCV: 91.5 fL (ref 79.5–101.0)
MONO#: 0.5 10*3/uL (ref 0.1–0.9)
MONO%: 6.7 % (ref 0.0–14.0)
NEUT#: 4.7 10*3/uL (ref 1.5–6.5)
NEUT%: 67.7 % (ref 38.4–76.8)
Platelets: 179 10*3/uL (ref 145–400)
RBC: 4.58 10*6/uL (ref 3.70–5.45)
RDW: 13.5 % (ref 11.2–14.5)
WBC: 7 10*3/uL (ref 3.9–10.3)
lymph#: 1.6 10*3/uL (ref 0.9–3.3)

## 2016-07-02 LAB — COMPREHENSIVE METABOLIC PANEL
ALBUMIN: 3.7 g/dL (ref 3.5–5.0)
ALK PHOS: 110 U/L (ref 40–150)
ALT: 19 U/L (ref 0–55)
AST: 23 U/L (ref 5–34)
Anion Gap: 12 mEq/L — ABNORMAL HIGH (ref 3–11)
BUN: 12.6 mg/dL (ref 7.0–26.0)
CALCIUM: 9.4 mg/dL (ref 8.4–10.4)
CHLORIDE: 100 meq/L (ref 98–109)
CO2: 28 mEq/L (ref 22–29)
CREATININE: 0.9 mg/dL (ref 0.6–1.1)
EGFR: 63 mL/min/{1.73_m2} — ABNORMAL LOW (ref >90)
GLUCOSE: 145 mg/dL — AB (ref 70–140)
Potassium: 3.7 mEq/L (ref 3.5–5.1)
Sodium: 139 mEq/L (ref 136–145)
Total Bilirubin: 0.63 mg/dL (ref 0.20–1.20)
Total Protein: 7 g/dL (ref 6.4–8.3)

## 2016-07-02 LAB — IRON AND TIBC
%SAT: 26 % (ref 21–57)
IRON: 76 ug/dL (ref 41–142)
TIBC: 288 ug/dL (ref 236–444)
UIBC: 212 ug/dL (ref 120–384)

## 2016-07-02 NOTE — Telephone Encounter (Signed)
Gave patient avs report and appointments for January 2018. No pof sent/created per office note lab/uf 6 months. Message to YF to confirm/send pof.

## 2016-07-02 NOTE — Progress Notes (Signed)
[name] [name] FOLLOW UP NOTE  Patient Care Team: [name], MD as PCP - General Suella Broad, MD (Physical Medicine and Rehabilitation) Hillary Bow, MD (Cardiology) Newton Pigg, MD (Obstetrics and Gynecology)  CHIEF COMPLAINTS Follow up anemia and MGUS   INITIAL PRESENTATION (first consult):  [name] [name] 75 y.o. female is being referred by Dr. Deatra Ina because of anemia.   She had lumbar and thoracic spine fusion surgery for kyphosis in Feb 2015 and Sep 2015, and required blood transfusion 2u each time after surgery. She was discharged to rehab after surgery, and she received 2u RBC in Nov 4th, 2015 for Hb 7.8g/dl. She was fatigued when her Hg was low, but still able to do some rehab. She had multiple full in the past few month and was hospitalized last month. She still has diffuse body pain from the recent fall, quite fatigued, is only able to walk a short distance, (+) dyspnea and chest heaviness on exertion. No nausea, no change of her bowel habits. Her appetite is moderate to low, she lost about 15-20lbs in the past year.  She denied any bleeding episodes including hematochezia, melana, hemoptysis, hematuria or epitaxis. No mucosal bleeding or easy bruising. She was finally discharged home on 10/30/14, still getting home PT/OT. She came in today with a wheelchair.   Per her daughter, she has had intermittent confusion and anxiety lately, she seems to have some cognitive function dificiency also. Pt her self contributes to some of her medications.   She has last colonoscopy about 3 years ago, she probably had polyps removed. Her last mammogram was one year ago which was normal. Per her daughter, she has a normal CT abdomen and pelvis about 5-6 month ago which was ordered for some abdominal symptoms. She has been on iron pill (1 daily) a few weeks ago.    CURRENT THERAPY: Observation   INTERVAL HISTORY  [name] [name] returns for follow up. She is accompanied by her  daughter to clinic today. She has not been doing well lately. Her main complaint is her right hip and upper inner thigh pain, especially when she gets up. She feels unsteady on her feet, she has been using a walker. She had multiple for in the past. She also complains of worsening fatigue, doesn't feel have energy to do things. She is only able to walk for short distance. Her daughter feels her fatigue, worse since she changed metoprolol to Cardizem a few months ago.  MEDICAL HISTORY:  Past Medical History  Diagnosis Date  . Obesity   . Atrial fibrillation    . CTS (carpal tunnel syndrome)   . Diarrhea   . IBS (irritable bowel syndrome)   . Diverticulitis of colon   . Gastritis   . Esophageal stricture, s/p dilation    . Personal history of colonic polyps 10/25/2011 & 12/02/11    not retrieved Dr Lyla Son & tubular adenomas  . Hiatal hernia   . GERD (gastroesophageal reflux disease)   . Obstructive sleep apnea   . Insomnia   . HTN (hypertension)   . Hyperlipidemia   . Depression/anxiety    . DIASTOLIC HEART FAILURE, CHRONIC 04/04/2009    Qualifier: Diagnosis of  By: Mare Ferrari, RMA, Sherri      SURGICAL HISTORY: Past Surgical History  Procedure Laterality Date  . Carpal tunnel release Right 1980's  . Total knee arthroplasty Bilateral 1990's - 2000's  . Cataract extraction Bilateral   . Left heart catheterization with coronary  angiogram N/A 12/27/2014    Procedure: LEFT HEART CATHETERIZATION WITH CORONARY ANGIOGRAM;  Surgeon: Dalton S McLean, MD;  Location: [facility]ATH [facility];  Service: Cardiovascular;  Laterality: N/A;  . Percutaneous coronary rotoblator intervention (pci-r)  01/07/2015  . Cardiac catheterization  12/27/2014    Procedure: INTRAVASCULAR PRESSURE WIRE/FFR STUDY;  Surgeon: Dalton S McLean, MD;  Location: [facility]ATH [facility];  Service: Cardiovascular;;  . Coronary angioplasty with stent placement  01/07/2015    "2"  . Laparoscopic cholecystectomy  2003  . Joint replacement    . Knee  arthroscopy Left 1995  . Back surgery    . Posterior lumbar fusion  01/2015  . Posterior fusion thoracic spine  08/2015  . Dilation and curettage of uterus    . Cataract extraction w/ intraocular lens  implant, bilateral  2000's  . Esophagogastroduodenoscopy (egd) with esophageal dilation  "several times"  . Bone marrow biopsy  11/2014  . Percutaneous coronary rotoblator intervention (pci-r) N/A 01/07/2015    Procedure: PERCUTANEOUS CORONARY ROTOBLATOR INTERVENTION (PCI-R);  Surgeon: Michael D Cooper, MD;  Location: [facility]ATH [facility];  Service: Cardiovascular;  Laterality: N/A;  . Coronary stent placement    . Cardioversion N/A 02/13/2016    Procedure: CARDIOVERSION;  Surgeon: Peter C Nishan, MD;  Location: [facility] ENDOS[facility];  Service: Cardiovascular;  Laterality: N/A;    SOCIAL HISTORY: Social History   Social History  . Marital Status: Widowed    Spouse Name: N/A  . Number of Children: 2  . Years of Education: HS   Occupational History  . librarian- retired     retired   Social History Main Topics  . Smoking status: Never Smoker   . Smokeless tobacco: Never Used  . Alcohol Use: No     Comment: 01/07/2015 "glass of wine at Christmas, maybe"  . Drug Use: No  . Sexual Activity: No   Other Topics Concern  . Not on file   Social History Narrative   Patient is right handed   Patient drinks 1-2 sodas daily.   patlient lives alone.       FAMILY HISTORY: Family History  Problem Relation Age of Onset  . Coronary artery disease Father   . Peripheral vascular disease Father   . Coronary artery disease Mother   . Coronary artery disease Brother   . Colon cancer Sister at 68   . Emphysema Sister   . Sleep apnea Son   . Colon cancer Sister at 72     spread to her brain  No family history of blood disorders or other malignancy   ALLERGIES:  is allergic to cymbalta; morphine and related; oxycodone-acetaminophen; oxycodone-acetaminophen; percocet; valium; duloxetine; altace; floxin;  hydromorphone; lipitor; lovaza; pravachol; trilipix; zetia; and zocor.  MEDICATIONS:  Current Outpatient Prescriptions  Medication Sig Dispense Refill  . apixaban (ELIQUIS) 5 MG TABS tablet Take 1 tablet (5 mg total) by mouth 2 (two) times daily. 180 tablet 3  . Calcium Citrate-Vitamin D (CALCIUM + D PO) Take 1 tablet by mouth daily.    . Cholecalciferol (VITAMIN D) 2000 units tablet Take 2,000 Units by mouth daily.    . colestipol (COLESTID) 1 g tablet Take 2 tablets (2 g total) by mouth 2 (two) times daily. 360 tablet 1  . diltiazem (CARDIZEM CD) 180 MG 24 hr capsule Take 1 capsule (180 mg total) by mouth daily. 90 capsule 3  . diphenoxylate-atropine (LOMOTIL) 2.5-0.025 MG per tablet Take 1 tablet by mouth 4 (four) times daily as needed for diarrhea or   loose stools.     . Eszopiclone 3 MG TABS Take 1 tablet (3 mg total) by mouth at bedtime. For insomnia,take immediately before bedtime. 90 tablet 0  . fentaNYL (DURAGESIC - DOSED MCG/HR) 50 MCG/HR Place 1 patch (50 mcg total) onto the skin every 3 (three) days. 10 patch 0  . FLUoxetine (PROZAC) 40 MG capsule Take 1 capsule by mouth  daily for depression 90 capsule 3  . furosemide (LASIX) 40 MG tablet Take 20 mg by mouth daily.    Marland Kitchen HYDROcodone-acetaminophen (NORCO) 10-325 MG per tablet Take 1 tablet by mouth every 4 (four) hours as needed for moderate pain.     . iron polysaccharides (NIFEREX) 150 MG capsule Take 1 capsule (150 mg total) by mouth daily. 90 capsule 3  . LORazepam (ATIVAN) 0.5 MG tablet Take 1 tablet (0.5 mg total) by mouth 2 (two) times daily as needed for anxiety. 60 tablet 1  . meclizine (ANTIVERT) 25 MG tablet Take 1 tablet (25 mg total) by mouth 3 (three) times daily as needed for dizziness. 60 tablet 0  . methocarbamol (ROBAXIN) 500 MG tablet Take 500 mg by mouth 4 (four) times daily as needed for muscle spasms.    . Multiple Vitamin (MULTIVITAMIN) tablet Take 1 tablet by mouth daily. For supplement    . NARCAN 4 MG/0.1ML  LIQD Place 1 spray into the nose as directed. For drug overdose    . nitroGLYCERIN (NITROSTAT) 0.4 MG SL tablet Place 1 tablet (0.4 mg total) under the tongue every 5 (five) minutes as needed for chest pain (up to 3 doses). 25 tablet 3  . pantoprazole (PROTONIX) 40 MG tablet Take 1 tablet (40 mg total) by mouth 2 (two) times daily. 180 tablet 3  . rosuvastatin (CRESTOR) 20 MG tablet Take 20 mg by mouth. Patient alternating taking 1 whole tablet (20 mg) by mouth once daily and 1/2 tablet (10 mg ) by mouth once daily the next day.     No current facility-administered medications for this visit.    REVIEW OF SYSTEMS:   Constitutional: Denies fevers, chills or abnormal night sweats Eyes: Denies blurriness of vision, double vision or watery eyes Ears, nose, mouth, throat, and face: Denies mucositis or sore throat Respiratory: Denies cough, dyspnea or wheezes Cardiovascular: Denies palpitation, chest discomfort or lower extremity swelling Gastrointestinal:  Denies nausea, heartburn or change in bowel habits Skin: Denies abnormal skin rashes Lymphatics: Denies new lymphadenopathy or easy bruising Neurological:Denies numbness, tingling or new weaknesses, (+) intermittent confusion, (+) chronic back pain  Behavioral/Psych: (+) anxiety, no new changes  All other systems were reviewed with the patient and are negative.  PHYSICAL EXAMINATION:  Filed Vitals:   07/02/16 1324  BP: 127/85  Pulse: 105  Temp: 98.9 F (37.2 C)  Resp: 17   Filed Weights   07/02/16 1324  Weight: 199 lb 4.8 oz (90.402 kg)    GENERAL:alert, no distress and comfortable, sitting in wheelchair  SKIN: skin color, texture, turgor are normal, no rashes or significant lesions EYES: normal, conjunctiva are pink and non-injected, sclera clear OROPHARYNX:no exudate, no erythema and lips, buccal mucosa, and tongue normal  NECK: supple, thyroid normal size, non-tender, without nodularity LYMPH:  no palpable lymphadenopathy in  the cervical, axillary or inguinal LUNGS: clear to auscultation and percussion with normal breathing effort HEART: regular rate & rhythm and no murmurs and no lower extremity edema ABDOMEN:abdomen soft, non-tender and normal bowel sounds Musculoskeletal:no cyanosis of digits and no clubbing. (+) surgical scar at  her thoracic and lumbar spine  PSYCH: alert & oriented x 3 with fluent speech, but argues with her daughter a lot  NEURO: no focal motor/sensory deficits  LABORATORY DATA:  CBC Latest Ref Rng 07/02/2016 06/22/2016 02/18/2016  WBC 3.9 - 10.3 10e3/uL 7.0 6.6 4.8  Hemoglobin 11.6 - 15.9 g/dL 13.9 - -  Hematocrit 34.8 - 46.6 % 41.9 41.2 40.0  Platelets 145 - 400 10e3/uL 179 206 181   CMP Latest Ref Rng 07/02/2016 06/22/2016 02/18/2016  Glucose 70 - 140 mg/dl 145(H) 106(H) 104(H)  BUN 7.0 - 26.0 mg/dL 12.6 17 10  Creatinine 0.6 - 1.1 mg/dL 0.9 0.92 0.72  Sodium 136 - 145 mEq/L 139 141 141  Potassium 3.5 - 5.1 mEq/L 3.7 4.1 4.3  Chloride 96 - 106 mmol/L - 97 99  CO2 22 - 29 mEq/L 28 27 27  Calcium 8.4 - 10.4 mg/dL 9.4 9.8 9.0  Total Protein 6.4 - 8.3 g/dL 7.0 6.4 5.9(L)  Total Bilirubin 0.20 - 1.20 mg/dL 0.63 0.4 0.5  Alkaline Phos 40 - 150 U/L 110 103 81  AST 5 - 34 U/L 23 21 21  ALT 0 - 55 U/L 19 20 17     SURGICAL PATH 12/07/14 Bone Marrow, Aspirate,Biopsy, and Clot, right post iliac - HYPERCELLULAR BONE MARROW FOR AGE WITH TRILINEAGE HEMATOPOIESIS AND 5% PLASMA CELLS. - SEE COMMENT. PERIPHERAL BLOOD: - NORMOCYTIC-NORMOCHROMIC ANEMIA. Diagnosis Note The bone marrow is hypercellular for age with trilineage hematopoiesis and essentially orderly and progressive maturation of all myeloid cell types. The plasma cells represent 5% of all cells associated with occasional small clusters. Immunohistochemical stains apparently show polyclonal staining pattern in plasma cells for kappa and lambda light chains. The appearance is non specific and not diagnostic of plasma  cell dyscrasia/neoplasm. Correlation  Cytogenetics: normal    RADIOGRAPHIC STUDIES: I have personally reviewed the radiological images as listed and agreed with the findings in the report. Ct Head Wo Contrast 10/27/2014   IMPRESSION: No acute intracranial abnormalities. Chronic atrophy and small vessel ischemic changes.     Ct Cervical Spine Wo Contrast 10/27/2014 IMPRESSION:  1. No evidence of acute fracture or malalignment.  2. Multilevel cervical spondylosis and facet arthropathy without focality.  3. Atherosclerotic vascular calcifications in both carotid arteries.      ASSESSMENT & PLAN:  [name] [name] is a 74-year-old female with multiple comorbidities, who presents with moderate persistent anemia after her spinal fusion surgery which required blood transfusion 3 times.  1. Aanemia of iron deficiency -I previously reviewed her lab result with her and  Her daughter. No lab evidence of megaloblastic anemia, or hemolysis.   -She has normal iron level and ferritin, but low iron saturation and elevated soluble transferrin receptor, and history of very low ferritin in the past (<10), which support IDA. Her bone marrow biopsy also showed low iron staining. -Anemia has spontaneously resolved. I suggest her continue to take by mouth niferex 1 tab daily.  -We'll continue monitor her CBC periodically.  2. MGUS, IgG  -SPEP showed low level M-protein (0.3), immunofixation was consistent with IgG lambda protein.  Quantitative immunoglobulin level and light chain levels were grossly normal. -I reviewed her bone marrow biopsy results, which showed 5% plasma cell, no monoclonal light chain deposition. -she likely has MGUS. No evidence of multiple myeloma. -Her CBC and BMP are normal, I think her right hip pain is more related to her arthritis, no clinical concern for disease progression to multiple myeloma -Her SPEP results is still   pending from today, M protein was 0.26 months ago. I'll call her next  week with the result.  3. Dementia, anxiety -She could not tolerate Aricept. Follow up with neurologist.  4. HTN, AF, dyslipidemia  -follow up with PCP and cardiologist  5. Right hip and thigh pain -I strongly encouraged her to follow up with her orthopedic surgeon  6. Fatigue -Probably related to her sedentary lifestyle, I strongly encouraged her to consider physical therapy   Plan -I'll call her next week for her lab results. -Return to clinic for follow-up in 6 months with lab  All questions were answered. The patient knows to call the clinic with any problems, questions or concerns.  I spent 20 minutes counseling the patient face to face. The total time spent in the appointment was 30 minutes and more than 50% was on counseling.     , , MD 07/02/2016 1:46 PM     

## 2016-07-03 DIAGNOSIS — M961 Postlaminectomy syndrome, not elsewhere classified: Secondary | ICD-10-CM | POA: Diagnosis not present

## 2016-07-03 DIAGNOSIS — M25551 Pain in right hip: Secondary | ICD-10-CM | POA: Diagnosis not present

## 2016-07-03 DIAGNOSIS — M5134 Other intervertebral disc degeneration, thoracic region: Secondary | ICD-10-CM | POA: Diagnosis not present

## 2016-07-03 DIAGNOSIS — M5136 Other intervertebral disc degeneration, lumbar region: Secondary | ICD-10-CM | POA: Diagnosis not present

## 2016-07-03 LAB — MULTIPLE MYELOMA PANEL, SERUM
ALBUMIN/GLOB SERPL: 1.3 (ref 0.7–1.7)
Albumin SerPl Elph-Mcnc: 3.5 g/dL (ref 2.9–4.4)
Alpha 1: 0.2 g/dL (ref 0.0–0.4)
Alpha2 Glob SerPl Elph-Mcnc: 0.8 g/dL (ref 0.4–1.0)
B-Globulin SerPl Elph-Mcnc: 1 g/dL (ref 0.7–1.3)
Gamma Glob SerPl Elph-Mcnc: 0.9 g/dL (ref 0.4–1.8)
Globulin, Total: 2.9 g/dL (ref 2.2–3.9)
IGA/IMMUNOGLOBULIN A, SERUM: 166 mg/dL (ref 64–422)
IGM (IMMUNOGLOBIN M), SRM: 86 mg/dL (ref 26–217)
IgG, Qn, Serum: 857 mg/dL (ref 700–1600)
M Protein SerPl Elph-Mcnc: 0.3 g/dL — ABNORMAL HIGH
Total Protein: 6.4 g/dL (ref 6.0–8.5)

## 2016-07-03 LAB — KAPPA/LAMBDA LIGHT CHAINS
IG KAPPA FREE LIGHT CHAIN: 30 mg/L — AB (ref 3.3–19.4)
IG LAMBDA FREE LIGHT CHAIN: 27.3 mg/L — AB (ref 5.7–26.3)
KAPPA/LAMBDA FLC RATIO: 1.1 (ref 0.26–1.65)

## 2016-07-10 ENCOUNTER — Other Ambulatory Visit: Payer: Self-pay | Admitting: Physical Medicine and Rehabilitation

## 2016-07-10 DIAGNOSIS — R5381 Other malaise: Secondary | ICD-10-CM

## 2016-07-13 ENCOUNTER — Other Ambulatory Visit: Payer: Self-pay | Admitting: Physical Medicine and Rehabilitation

## 2016-07-13 DIAGNOSIS — M25551 Pain in right hip: Secondary | ICD-10-CM

## 2016-07-14 ENCOUNTER — Ambulatory Visit
Admission: RE | Admit: 2016-07-14 | Discharge: 2016-07-14 | Disposition: A | Discharge status: Home / Self Care | Payer: Medicare HMO | Source: Ambulatory Visit | Attending: Physical Medicine and Rehabilitation | Admitting: Physical Medicine and Rehabilitation

## 2016-07-14 DIAGNOSIS — M25551 Pain in right hip: Secondary | ICD-10-CM | POA: Diagnosis not present

## 2016-07-16 DIAGNOSIS — H00029 Hordeolum internum unspecified eye, unspecified eyelid: Secondary | ICD-10-CM | POA: Diagnosis not present

## 2016-07-16 DIAGNOSIS — H40003 Preglaucoma, unspecified, bilateral: Secondary | ICD-10-CM | POA: Diagnosis not present

## 2016-07-16 DIAGNOSIS — I10 Essential (primary) hypertension: Secondary | ICD-10-CM | POA: Diagnosis not present

## 2016-07-16 DIAGNOSIS — R231 Pallor: Secondary | ICD-10-CM | POA: Diagnosis not present

## 2016-07-21 DIAGNOSIS — F419 Anxiety disorder, unspecified: Secondary | ICD-10-CM | POA: Insufficient documentation

## 2016-07-21 DIAGNOSIS — Z862 Personal history of diseases of the blood and blood-forming organs and certain disorders involving the immune mechanism: Secondary | ICD-10-CM | POA: Insufficient documentation

## 2016-07-28 DIAGNOSIS — I10 Essential (primary) hypertension: Secondary | ICD-10-CM | POA: Diagnosis not present

## 2016-07-28 DIAGNOSIS — Z1211 Encounter for screening for malignant neoplasm of colon: Secondary | ICD-10-CM | POA: Diagnosis not present

## 2016-07-28 DIAGNOSIS — Z86010 Personal history of colonic polyps: Secondary | ICD-10-CM | POA: Diagnosis not present

## 2016-07-28 DIAGNOSIS — D649 Anemia, unspecified: Secondary | ICD-10-CM | POA: Diagnosis not present

## 2016-07-28 DIAGNOSIS — K219 Gastro-esophageal reflux disease without esophagitis: Secondary | ICD-10-CM | POA: Diagnosis not present

## 2016-07-28 DIAGNOSIS — K589 Irritable bowel syndrome without diarrhea: Secondary | ICD-10-CM | POA: Diagnosis not present

## 2016-07-28 DIAGNOSIS — R69 Illness, unspecified: Secondary | ICD-10-CM | POA: Diagnosis not present

## 2016-07-28 DIAGNOSIS — Z800 Family history of malignant neoplasm of digestive organs: Secondary | ICD-10-CM | POA: Diagnosis not present

## 2016-07-28 DIAGNOSIS — D472 Monoclonal gammopathy: Secondary | ICD-10-CM | POA: Diagnosis not present

## 2016-07-28 DIAGNOSIS — G894 Chronic pain syndrome: Secondary | ICD-10-CM | POA: Diagnosis not present

## 2016-07-28 DIAGNOSIS — M549 Dorsalgia, unspecified: Secondary | ICD-10-CM | POA: Diagnosis not present

## 2016-08-06 DIAGNOSIS — M1611 Unilateral primary osteoarthritis, right hip: Secondary | ICD-10-CM | POA: Diagnosis not present

## 2016-08-09 NOTE — Progress Notes (Signed)
Cardiology Office Note:    Date:  08/10/2016   ID:  [name] [name], DOB Apr 28, 1941, MRN [id number]  PCP:  Redge Gainer, MD  Cardiologist:  Dr. Loralie Champagne  >> patient requests Dr. Sherren Mocha   Electrophysiologist:  N/a Ortho/Pain:  Dr. Nelva Bush  Referring MD: [name], MD   Chief Complaint  Patient presents with  . Follow-up    Atrial Fibrillation   History of Present Illness:    [name] [name] is a 75 y.o. female with a hx of CAD, diastolic HF, HTN, HL, PAF. She has had multiple back surgeries and is on chronic narcotic pain medication. She has a hx of orthostatic hypotension and multiple falls as well as confusion. She had previously been kept off of anticoagulation. She had chest pain in 12/15 and Myoview was abnormal. LHC demonstrated complex bifurcational distal LM/ostial LCx disease. She was felt to be a poor CABG candidate due to difficulty with rehab. She underwent bifurcational PCI with DES to the distal LM and ostial LCx.   I saw her in 1/17 and she was noted to be in AF with RVR when she presented to [facility] for pre-op workup for upcoming colonoscopy. We put her on Eliquis 5 bid and Toprol.  Echo 01/29/16 demonstrated normal LVF, mod LAE, mild MR and PASP 37 mmHg.  TSH was normal.  She underwent DCCV in 2/17.  But, she had recurrent AF after DCCV. I referred her to [facility]ib Clinic.  While there, she was in NSR.  She has had return of AFib since.  She has not felt any different in NSR vs AFib.  Therefore, she has been tx with a rate control strategy.    Last seen by Dr. Burt Knack 6/17. She remained in atrial fibrillation. Heart rate was uncontrolled. Her calcium channel blocker dose was adjusted. She returns for follow-up.  She is here with her daughter. She complains of significant fatigue. She denies chest pain, syncope, dyspnea, orthopnea, PND, edema.  Her fatigue has been persistent since starting rate controlling medications.     Prior CV studies that were reviewed  today include:    Holter 3/17  The baseline rhythm is atrial fibrillation  The average ventricular rate is 96 bpm  There are periods of rapid atrial fib and slow atrial fib with pauses up to 3 seconds  There are single PVC's or aberrated beats with a rare ventricular couplet  Echo 01/29/16 EF 50-55%, trivial AI, mild MR, mod LAE, PASP 37 mmHg  LHC 1/16 LM dist ? 50% LAD prox 30% - FFR 0.82 LCx ostial 70% - FFR 0.86 >> 0.66 RCA mid 40% CABG deferred b/c of poor functional capacity PCI: Complex bifurcational distal LM and ostial LCx with rotational atherectomy and IVUS guided with 2.75 x 12 mm Synergy DES to ostial LCx; 4 x 16 mm Synergy DES to distal LM  Myoview 12/15   Intermediate risk stress nuclear study Moderate area of anteroapical and anteroseptal ischemia Also inferolateral wall infarct with moderate peri infarct ischemia Study suggests multi vessel diseae. LV Ejection Fraction: 57%  Echo 7/11 Mild LVH, EF 55-60%, no RWMA, mild MR  Past Medical History:  Diagnosis Date  . Anemia   . Anxiety   . Arthritis    "knees, back, fingers, toes; joints" (01/07/2015)  . CAD (coronary artery disease)    a. Abnl nuc 11/2014. Cath 12/2014 - turned down for CABG. Ultimately s/p TTVP, rotational atherectomy, PTCA and stenting of the ostial LCx and left  main into the LAD (crush technique), and IVUS of the LAD/Left main.  . Chronic back pain greater than 3 months duration    a. spinal stenosis. Spinal fusion with rods in 2/15 at [facility] spinal fusion 9/15.  Marland Kitchen Chronic diastolic CHF (congestive heart failure) ([place])   . Circadian rhythm sleep disorder   . CTS (carpal tunnel syndrome)   . Depression   . Diarrhea   . Diverticulitis of colon   . Esophageal stricture   . Gastritis   . GERD (gastroesophageal reflux disease)   . Hiatal hernia   . History of blood transfusion    "most of them related to OR's"   . History of echocardiogram    a. Echo 2/17:  EF 50-55%,  trivial AI, midl MR, mod LAE, PASP 37 mmHg  . HTN (hypertension)   . Hyperlipidemia   . IBS (irritable bowel syndrome)   . Insomnia   . Memory disorder 12/04/2014  . MGUS (monoclonal gammopathy of unknown significance) dx'd 11/2014   a. Neg BMB 11/2014.  . Multiple falls   . Obesity   . Obstructive sleep apnea    "have mask; don't wear it" (01/07/2015)  . Orthostasis   . PAF (paroxysmal atrial fibrillation) ([place])    a.  Paroxysmal. Has only been noted post-op 9/15 spinal fusion. She had cardioversion, not on anticoagulation. Fall risk, unsteady.  Marland Kitchen PAT (paroxysmal atrial tachycardia) ([place])   . Personal history of colonic polyps 10/25/2011 & 12/02/11   not retrieved Dr Lyla Son & tubular adenomas  . Pneumonia 03/2014  . Sinus bradycardia    a. Baseline HR 50s-60s.  . Stroke [facility]) early 2000's   "small"; denies residual on 01/07/2015)  1. Chronic low back pain: spinal stenosis. Spinal fusion with rods in 2/15 at [facility]. Repeat spinal fusion 9/15.  2. Obesity 3. H/o diverticulitis 4. IBS 5. H/o colon polyps 6. GERD: h/o hiatal hernia 7. OSA 8. HTN 9. Hyperlipidemia 10. Depression 11. Cholecystectomy 12. H/o bilateral TKR 13. CAD: LHC (5/09) with 50% ostial LCx, 30-40% mRCA, 30% mLAD. Lexiscan Cardiolite in 11/14 with EF 68%, no ischemia or infarction. Lexiscan Cardiolite (12/15) with EF 57%, moderate anteroapical and anteroseptal ischemia, inferolateral infarct with peri-infarct ischemia => suggestive of multivessel disease. LHC (1/16) with severe distal left main and ostial LCx stenosis confirmed by FFR. Patient had bifurcation stenting of the distal LM/ostial LCx with rotational atherectomy.  14. PNA in 3/15 15. Atrial fibrillation: Paroxysmal. Has only been noted post-op 9/15 spinal fusion. She had cardioversion, not on anticoagulation.  16. MGUS: Negative bone marrow biopsy 12/15.  17. Anemia  Past Surgical History:  Procedure Laterality Date  . BACK  SURGERY    . BONE MARROW BIOPSY  11/2014  . CARDIAC CATHETERIZATION  12/27/2014   Procedure: INTRAVASCULAR PRESSURE WIRE/FFR STUDY;  Surgeon: Larey Dresser, MD;  Location: [facility] CATH LAB;  Service: Cardiovascular;;  . CARDIOVERSION N/A 02/13/2016   Procedure: CARDIOVERSION;  Surgeon: Josue Hector, MD;  Location: [facility] ENDOSCOPY;  Service: Cardiovascular;  Laterality: N/A;  . CARPAL TUNNEL RELEASE Right 1980's  . CATARACT EXTRACTION Bilateral   . CATARACT EXTRACTION W/ INTRAOCULAR LENS  IMPLANT, BILATERAL  2000's  . CORONARY ANGIOPLASTY WITH STENT PLACEMENT  01/07/2015   "2"  . CORONARY STENT PLACEMENT    . DILATION AND CURETTAGE OF UTERUS    . ESOPHAGOGASTRODUODENOSCOPY (EGD) WITH ESOPHAGEAL DILATION  "several times"  . JOINT REPLACEMENT    . KNEE ARTHROSCOPY Left 1995  .  LAPAROSCOPIC CHOLECYSTECTOMY  2003  . LEFT HEART CATHETERIZATION WITH CORONARY ANGIOGRAM N/A 12/27/2014   Procedure: LEFT HEART CATHETERIZATION WITH CORONARY ANGIOGRAM;  Surgeon: Larey Dresser, MD;  Location: [facility] CATH LAB;  Service: Cardiovascular;  Laterality: N/A;  . PERCUTANEOUS CORONARY ROTOBLATOR INTERVENTION (PCI-R)  01/07/2015  . PERCUTANEOUS CORONARY ROTOBLATOR INTERVENTION (PCI-R) N/A 01/07/2015   Procedure: PERCUTANEOUS CORONARY ROTOBLATOR INTERVENTION (PCI-R);  Surgeon: Blane Ohara, MD;  Location: [facility] CATH LAB;  Service: Cardiovascular;  Laterality: N/A;  . POSTERIOR FUSION THORACIC SPINE  08/2015  . POSTERIOR LUMBAR FUSION  01/2015  . TOTAL KNEE ARTHROPLASTY Bilateral 1990's - 2000's    Current Medications: Outpatient Medications Prior to Visit  Medication Sig Dispense Refill  . apixaban (ELIQUIS) 5 MG TABS tablet Take 1 tablet (5 mg total) by mouth 2 (two) times daily. 180 tablet 3  . Calcium Citrate-Vitamin D (CALCIUM + D PO) Take 1 tablet by mouth daily.    . Cholecalciferol (VITAMIN D) 2000 units tablet Take 2,000 Units by mouth daily.    . colestipol (COLESTID) 1 g tablet Take 2 tablets (2 g total) by  mouth 2 (two) times daily. 360 tablet 1  . diphenoxylate-atropine (LOMOTIL) 2.5-0.025 MG per tablet Take 1 tablet by mouth 4 (four) times daily as needed for diarrhea or loose stools.     . Eszopiclone 3 MG TABS Take 1 tablet (3 mg total) by mouth at bedtime. For insomnia,take immediately before bedtime. 90 tablet 0  . fentaNYL (DURAGESIC - DOSED MCG/HR) 50 MCG/HR Place 1 patch (50 mcg total) onto the skin every 3 (three) days. 10 patch 0  . FLUoxetine (PROZAC) 40 MG capsule Take 1 capsule by mouth  daily for depression 90 capsule 3  . furosemide (LASIX) 40 MG tablet Take 20 mg by mouth daily.    Marland Kitchen HYDROcodone-acetaminophen (NORCO) 10-325 MG per tablet Take 1 tablet by mouth every 4 (four) hours as needed for moderate pain.     Marland Kitchen LORazepam (ATIVAN) 0.5 MG tablet Take 1 tablet (0.5 mg total) by mouth 2 (two) times daily as needed for anxiety. 60 tablet 1  . meclizine (ANTIVERT) 25 MG tablet Take 1 tablet (25 mg total) by mouth 3 (three) times daily as needed for dizziness. 60 tablet 0  . methocarbamol (ROBAXIN) 500 MG tablet Take 500 mg by mouth 4 (four) times daily as needed for muscle spasms.    . Multiple Vitamin (MULTIVITAMIN) tablet Take 1 tablet by mouth daily. For supplement    . NARCAN 4 MG/0.1ML LIQD Place 1 spray into the nose as directed. For drug overdose    . nitroGLYCERIN (NITROSTAT) 0.4 MG SL tablet Place 1 tablet (0.4 mg total) under the tongue every 5 (five) minutes as needed for chest pain (up to 3 doses). 25 tablet 3  . pantoprazole (PROTONIX) 40 MG tablet Take 1 tablet (40 mg total) by mouth 2 (two) times daily. 180 tablet 3  . rosuvastatin (CRESTOR) 20 MG tablet Patient alternating taking 1 whole tablet (20 mg) by mouth once daily and 1/2 tablet (10 mg ) by mouth once daily the next day.     . diltiazem (CARDIZEM CD) 180 MG 24 hr capsule Take 1 capsule (180 mg total) by mouth daily. 90 capsule 3  . iron polysaccharides (NIFEREX) 150 MG capsule Take 1 capsule (150 mg total) by  mouth daily. (Patient not taking: Reported on 08/10/2016) 90 capsule 3   No facility-administered medications prior to visit.       Allergies:  Cymbalta [duloxetine hcl]; Morphine and related; Oxycodone-acetaminophen; Oxycodone-acetaminophen; Percocet [oxycodone-acetaminophen]; Valium [diazepam]; Duloxetine; Morphine; Altace [ramipril]; Floxin [ofloxacin]; Hydromorphone; Lipitor [atorvastatin]; Lovaza [omega-3-acid ethyl esters]; Pravachol [pravastatin sodium]; Trilipix [choline fenofibrate]; Zetia [ezetimibe]; and Zocor [simvastatin]   Social History   Social History  . Marital status: Widowed    Spouse name: N/A  . Number of children: 2  . Years of education: HS   Occupational History  . librarian- retired     retired   Social History Main Topics  . Smoking status: Never Smoker  . Smokeless tobacco: Never Used  . Alcohol use No     Comment: 01/07/2015 "glass of wine at Christmas, maybe"  . Drug use: No  . Sexual activity: No   Other Topics Concern  . None   Social History Narrative   Patient is right handed   Patient drinks 1-2 sodas daily.   patlient lives alone.        Family History:  The patient's family history includes Colon cancer in her sister and sister; Coronary artery disease in her brother, father, and mother; Emphysema in her brother and sister; Peripheral vascular disease in her father; Sleep apnea in her son.   ROS:   Please see the history of present illness.    ROS All other systems reviewed and are negative.   EKGs/Labs/Other Test Reviewed:    EKG:  EKG is  ordered today.  The ekg ordered today demonstrates AFib, HR 128  Recent Labs: 08/15/2015: BNP 43.7 06/22/2016: TSH 4.340 07/02/2016: ALT 19; BUN 12.6; Creatinine 0.9; HGB 13.9; Platelets 179; Potassium 3.7; Sodium 139   Recent Lipid Panel    Component Value Date/Time   CHOL 143 06/22/2016 1515   CHOL 156 05/11/2013 1536   TRIG 157 (H) 06/22/2016 1515   TRIG 121 02/18/2016 1504   TRIG 239  (H) 05/11/2013 1536   HDL 73 06/22/2016 1515   HDL 57 02/18/2016 1504   HDL 43 05/11/2013 1536   CHOLHDL 2.0 06/22/2016 1515   LDLCALC 39 06/22/2016 1515   LDLCALC 50 08/21/2014 1141   LDLCALC 65 05/11/2013 1536     Physical Exam:    VS:  BP 120/70   Ht 5' 6"  (1.676 m)   Wt 197 lb 1.9 oz (89.4 kg)   BMI 31.82 kg/m     Wt Readings from Last 3 Encounters:  08/10/16 197 lb 1.9 oz (89.4 kg)  07/02/16 199 lb 4.8 oz (90.4 kg)  06/22/16 197 lb (89.4 kg)     Physical Exam  Constitutional: She is oriented to person, place, and time. She appears well-developed and well-nourished. No distress.  HENT:  Head: Normocephalic and atraumatic.  Eyes: No scleral icterus.  Neck: Normal range of motion. No JVD present.  Cardiovascular: S1 normal and S2 normal.  An irregularly irregular rhythm present. Tachycardia present.  Exam reveals no gallop and no friction rub.   No murmur heard. Pulmonary/Chest: Breath sounds normal. She has no wheezes. She has no rhonchi. She has no rales.  Abdominal: Soft. There is no tenderness.  Musculoskeletal: She exhibits no edema.  Neurological: She is alert and oriented to person, place, and time.  Skin: Skin is warm and dry.  Psychiatric: She has a normal mood and affect.    ASSESSMENT:    1. Chronic atrial fibrillation ([place])   2. Chronic pain   3. Coronary artery disease involving native coronary artery of native heart without angina pectoris   4. Chronic diastolic heart failure ([place])  5. Essential hypertension    PLAN:    In order of problems listed above:  1. Chronic AFib - s/p failed DCCV.  Her HR remains uncontrolled.  She admits compliance with medications.  Question if fatigue related to uncontrolled HR.  Recent Hgb, Creatinine ok.  -  Increase Dilt to 120 bid  -  24 hr Holter in 1 month to assess avg HR  2. Chronic Pain - I have asked her to FU with her pain doctor to reduce the dose of Fentanyl as Diltiazem can increase Fentanyl  levels.  3. CAD - No angina. She is no longer on ASA or Plavix as she is on Eliquis. Continue statin.   4. Chronic Diastolic CHF -  Volume appears stable.   5. HTN - Controlled.     Medication Adjustments/Labs and Tests Ordered: Current medicines are reviewed at length with the patient today.  Concerns regarding medicines are outlined above.  Medication changes, Labs and Tests ordered today are outlined in the Patient Instructions noted below. Patient Instructions  Medication Instructions:  STOP taking Diltiazem 180 mg START Diltiazem CD 120 mg Twice daily (every 12 hours) Labwork: None  Testing/Procedures: Schedule 24 Hr Holter monitor in 1 month. Follow-Up: Richardson Dopp, PA-C in 3 mos Any Other Special Instructions Will Be Listed Below (If Applicable). You need to call your pain doctor (Dr. Nelva Bush) and get your Fentanyl dose reduce.  The Diltiazem can cause levels of Fentanyl to increase in your body. If you need a refill on your cardiac medications before your next appointment, please call your pharmacy.   Signed, Richardson Dopp, PA-C  08/10/2016 5:03 PM    [facility] [place], [place], [place]  [address] Phone: [phone]; Fax: [phone]

## 2016-08-10 ENCOUNTER — Ambulatory Visit (INDEPENDENT_AMBULATORY_CARE_PROVIDER_SITE_OTHER): Payer: Medicare HMO | Admitting: Physician Assistant

## 2016-08-10 ENCOUNTER — Encounter: Payer: Self-pay | Admitting: Physician Assistant

## 2016-08-10 VITALS — BP 120/70 | Ht 66.0 in | Wt 197.1 lb

## 2016-08-10 DIAGNOSIS — I5032 Chronic diastolic (congestive) heart failure: Secondary | ICD-10-CM

## 2016-08-10 DIAGNOSIS — I10 Essential (primary) hypertension: Secondary | ICD-10-CM

## 2016-08-10 DIAGNOSIS — I482 Chronic atrial fibrillation, unspecified: Secondary | ICD-10-CM

## 2016-08-10 DIAGNOSIS — I2510 Atherosclerotic heart disease of native coronary artery without angina pectoris: Secondary | ICD-10-CM | POA: Diagnosis not present

## 2016-08-10 DIAGNOSIS — G8929 Other chronic pain: Secondary | ICD-10-CM | POA: Diagnosis not present

## 2016-08-10 MED ORDER — DILTIAZEM HCL ER COATED BEADS 120 MG PO CP24: 120.0000 mg | ORAL_CAPSULE | Freq: Every day | ORAL | 3 refills | Status: DC

## 2016-08-10 MED ORDER — DILTIAZEM HCL ER COATED BEADS 120 MG PO CP24: 120.0000 mg | ORAL_CAPSULE | Freq: Two times a day (BID) | ORAL | 3 refills | Status: DC

## 2016-08-10 NOTE — Patient Instructions (Addendum)
Medication Instructions:  STOP taking Diltiazem 180 mg START Diltiazem CD 120 mg Twice daily (every 12 hours) Labwork: None  Testing/Procedures: Schedule 24 Hr Holter monitor in 1 month. Follow-Up: Richardson Dopp, PA-C in 3 mos Any Other Special Instructions Will Be Listed Below (If Applicable). You need to call your pain doctor (Dr. Nelva Bush) and get your Fentanyl dose reduce.  The Diltiazem can cause levels of Fentanyl to increase in your body. If you need a refill on your cardiac medications before your next appointment, please call your pharmacy.

## 2016-08-11 ENCOUNTER — Telehealth: Payer: Self-pay | Admitting: Physician Assistant

## 2016-08-11 NOTE — Telephone Encounter (Signed)
Pt's daughter called because pt's medication Cardizem CD medication dose was decreased from 180 to 120 mg twice a day yesterday 8/21 on the O/V with scott Kathlen Mody PA. Pt had called the mail order and they don't have the prescription. Daughter also said that pt had asked to send the prescription send to the [facility] in [place] for 30 days and 1 refill, daughter was made aware that the prescription was sent to [facility] . I spoke with Altha Harm at [facility] she verify that they have the prescription. Daughter states that was fine,  because pt have 120 mg medication left from before.

## 2016-08-11 NOTE — Telephone Encounter (Signed)
New message   Pt daughter verbalized that pt has appt with Nicki Reaper and that they was told medication was going to be sent in, she went tot he pharmacy and they still don't have prescription  Pt daughter wants to speak to rn to see if it was accidentally sent into the mail order

## 2016-08-20 DIAGNOSIS — G8929 Other chronic pain: Secondary | ICD-10-CM | POA: Diagnosis not present

## 2016-08-20 DIAGNOSIS — K581 Irritable bowel syndrome with constipation: Secondary | ICD-10-CM | POA: Diagnosis not present

## 2016-08-20 DIAGNOSIS — R1084 Generalized abdominal pain: Secondary | ICD-10-CM | POA: Diagnosis not present

## 2016-08-20 DIAGNOSIS — Z7901 Long term (current) use of anticoagulants: Secondary | ICD-10-CM | POA: Diagnosis not present

## 2016-08-20 DIAGNOSIS — M549 Dorsalgia, unspecified: Secondary | ICD-10-CM | POA: Diagnosis not present

## 2016-08-20 DIAGNOSIS — Z79899 Other long term (current) drug therapy: Secondary | ICD-10-CM | POA: Diagnosis not present

## 2016-08-20 DIAGNOSIS — I4891 Unspecified atrial fibrillation: Secondary | ICD-10-CM | POA: Diagnosis not present

## 2016-08-21 ENCOUNTER — Other Ambulatory Visit: Payer: Self-pay

## 2016-08-21 MED ORDER — ESZOPICLONE 3 MG PO TABS: 3.0000 mg | ORAL_TABLET | Freq: Every day | ORAL | 1 refills | Status: DC

## 2016-08-21 NOTE — Telephone Encounter (Signed)
Pt aware written Rx is at front desk ready for pickup  

## 2016-09-09 ENCOUNTER — Encounter: Payer: Self-pay | Admitting: Physician Assistant

## 2016-09-10 ENCOUNTER — Ambulatory Visit (INDEPENDENT_AMBULATORY_CARE_PROVIDER_SITE_OTHER): Payer: Medicare HMO

## 2016-09-10 DIAGNOSIS — I482 Chronic atrial fibrillation, unspecified: Secondary | ICD-10-CM

## 2016-09-17 DIAGNOSIS — M7751 Other enthesopathy of right foot: Secondary | ICD-10-CM | POA: Diagnosis not present

## 2016-09-17 DIAGNOSIS — M21621 Bunionette of right foot: Secondary | ICD-10-CM | POA: Diagnosis not present

## 2016-09-17 DIAGNOSIS — M71571 Other bursitis, not elsewhere classified, right ankle and foot: Secondary | ICD-10-CM | POA: Diagnosis not present

## 2016-09-23 DIAGNOSIS — L900 Lichen sclerosus et atrophicus: Secondary | ICD-10-CM | POA: Diagnosis not present

## 2016-09-23 DIAGNOSIS — Z130 Encounter for screening for diseases of the blood and blood-forming organs and certain disorders involving the immune mechanism: Secondary | ICD-10-CM | POA: Diagnosis not present

## 2016-09-27 ENCOUNTER — Encounter: Payer: Self-pay | Admitting: Physician Assistant

## 2016-10-16 DIAGNOSIS — M961 Postlaminectomy syndrome, not elsewhere classified: Secondary | ICD-10-CM | POA: Diagnosis not present

## 2016-10-26 ENCOUNTER — Ambulatory Visit: Payer: Medicare HMO | Admitting: Family Medicine

## 2016-10-27 ENCOUNTER — Ambulatory Visit (INDEPENDENT_AMBULATORY_CARE_PROVIDER_SITE_OTHER): Payer: Medicare HMO

## 2016-10-27 ENCOUNTER — Other Ambulatory Visit (INDEPENDENT_AMBULATORY_CARE_PROVIDER_SITE_OTHER): Payer: Medicare HMO

## 2016-10-27 DIAGNOSIS — I5032 Chronic diastolic (congestive) heart failure: Secondary | ICD-10-CM | POA: Diagnosis not present

## 2016-10-27 DIAGNOSIS — L720 Epidermal cyst: Secondary | ICD-10-CM | POA: Diagnosis not present

## 2016-10-27 DIAGNOSIS — E7800 Pure hypercholesterolemia, unspecified: Secondary | ICD-10-CM

## 2016-10-27 DIAGNOSIS — Z23 Encounter for immunization: Secondary | ICD-10-CM | POA: Diagnosis not present

## 2016-10-27 DIAGNOSIS — R609 Edema, unspecified: Secondary | ICD-10-CM

## 2016-10-27 DIAGNOSIS — I10 Essential (primary) hypertension: Secondary | ICD-10-CM | POA: Diagnosis not present

## 2016-10-27 DIAGNOSIS — D1801 Hemangioma of skin and subcutaneous tissue: Secondary | ICD-10-CM | POA: Diagnosis not present

## 2016-10-27 DIAGNOSIS — B353 Tinea pedis: Secondary | ICD-10-CM | POA: Diagnosis not present

## 2016-10-27 DIAGNOSIS — K219 Gastro-esophageal reflux disease without esophagitis: Secondary | ICD-10-CM | POA: Diagnosis not present

## 2016-10-27 DIAGNOSIS — L814 Other melanin hyperpigmentation: Secondary | ICD-10-CM | POA: Diagnosis not present

## 2016-10-27 DIAGNOSIS — E559 Vitamin D deficiency, unspecified: Secondary | ICD-10-CM | POA: Diagnosis not present

## 2016-10-27 DIAGNOSIS — I480 Paroxysmal atrial fibrillation: Secondary | ICD-10-CM | POA: Diagnosis not present

## 2016-10-27 DIAGNOSIS — Z85828 Personal history of other malignant neoplasm of skin: Secondary | ICD-10-CM | POA: Diagnosis not present

## 2016-10-27 DIAGNOSIS — L821 Other seborrheic keratosis: Secondary | ICD-10-CM | POA: Diagnosis not present

## 2016-10-27 DIAGNOSIS — L570 Actinic keratosis: Secondary | ICD-10-CM | POA: Diagnosis not present

## 2016-10-28 ENCOUNTER — Ambulatory Visit: Payer: Medicare HMO | Admitting: Family Medicine

## 2016-10-28 LAB — CBC WITH DIFFERENTIAL/PLATELET
BASOS: 0 %
Basophils Absolute: 0 10*3/uL (ref 0.0–0.2)
EOS (ABSOLUTE): 0.1 10*3/uL (ref 0.0–0.4)
EOS: 1 %
HEMATOCRIT: 39.6 % (ref 34.0–46.6)
Hemoglobin: 13.2 g/dL (ref 11.1–15.9)
IMMATURE GRANULOCYTES: 0 %
Immature Grans (Abs): 0 10*3/uL (ref 0.0–0.1)
LYMPHS ABS: 1.9 10*3/uL (ref 0.7–3.1)
Lymphs: 28 %
MCH: 30.4 pg (ref 26.6–33.0)
MCHC: 33.3 g/dL (ref 31.5–35.7)
MCV: 91 fL (ref 79–97)
MONOS ABS: 0.5 10*3/uL (ref 0.1–0.9)
Monocytes: 7 %
NEUTROS ABS: 4.3 10*3/uL (ref 1.4–7.0)
Neutrophils: 64 %
Platelets: 220 10*3/uL (ref 150–379)
RBC: 4.34 x10E6/uL (ref 3.77–5.28)
RDW: 13.8 % (ref 12.3–15.4)
WBC: 6.7 10*3/uL (ref 3.4–10.8)

## 2016-10-28 LAB — LIPID PANEL
CHOL/HDL RATIO: 2.3 ratio (ref 0.0–4.4)
CHOLESTEROL TOTAL: 146 mg/dL (ref 100–199)
HDL: 64 mg/dL (ref >39)
LDL CALC: 51 mg/dL (ref 0–99)
TRIGLYCERIDES: 154 mg/dL — AB (ref 0–149)
VLDL CHOLESTEROL CAL: 31 mg/dL (ref 5–40)

## 2016-10-28 LAB — BMP8+EGFR
BUN/Creatinine Ratio: 16 (ref 12–28)
BUN: 12 mg/dL (ref 8–27)
CHLORIDE: 98 mmol/L (ref 96–106)
CO2: 29 mmol/L (ref 18–29)
Calcium: 9.3 mg/dL (ref 8.7–10.3)
Creatinine, Ser: 0.76 mg/dL (ref 0.57–1.00)
GFR, EST AFRICAN AMERICAN: 88 mL/min/{1.73_m2} (ref >59)
GFR, EST NON AFRICAN AMERICAN: 76 mL/min/{1.73_m2} (ref >59)
Glucose: 100 mg/dL — ABNORMAL HIGH (ref 65–99)
POTASSIUM: 4.2 mmol/L (ref 3.5–5.2)
SODIUM: 141 mmol/L (ref 134–144)

## 2016-10-28 LAB — HEPATIC FUNCTION PANEL
ALT: 43 IU/L — AB (ref 0–32)
AST: 63 IU/L — AB (ref 0–40)
Albumin: 3.9 g/dL (ref 3.5–4.8)
Alkaline Phosphatase: 147 IU/L — ABNORMAL HIGH (ref 39–117)
BILIRUBIN TOTAL: 0.5 mg/dL (ref 0.0–1.2)
Bilirubin, Direct: 0.19 mg/dL (ref 0.00–0.40)
Total Protein: 6.2 g/dL (ref 6.0–8.5)

## 2016-10-28 LAB — BRAIN NATRIURETIC PEPTIDE: BNP: 215.8 pg/mL — ABNORMAL HIGH (ref 0.0–100.0)

## 2016-10-28 LAB — VITAMIN D 25 HYDROXY (VIT D DEFICIENCY, FRACTURES): VIT D 25 HYDROXY: 32.5 ng/mL (ref 30.0–100.0)

## 2016-10-29 ENCOUNTER — Telehealth: Payer: Self-pay | Admitting: Family Medicine

## 2016-10-29 DIAGNOSIS — I10 Essential (primary) hypertension: Secondary | ICD-10-CM

## 2016-10-29 DIAGNOSIS — I5032 Chronic diastolic (congestive) heart failure: Secondary | ICD-10-CM

## 2016-10-29 NOTE — Telephone Encounter (Signed)
Aware. 

## 2016-10-30 ENCOUNTER — Telehealth: Payer: Self-pay | Admitting: Family Medicine

## 2016-10-30 NOTE — Telephone Encounter (Signed)
DPR ok to s/w daughter [name]. [name] aware of [employer]. PA recommendations to have pt take lasix 60 mg daily for 3 days then resume current dose of lasix 40 mg daily as well as to increase dietary K+ for 3 days while on the increased dose of lasix . Daughter [name] verbalized understanding to plan of care. BMET 11/17, advised keep appt with Brynda Rim. PA 11/10/16.

## 2016-10-30 NOTE — Telephone Encounter (Signed)
BNP is just mildly elevated. Please see notes on labs >> if dyspnea or edema seem to be much worse than usual, ok to increase Lasix to 60 QD x 3 days with FU BMET in 1 week (also increase dietary potassium x 3 days). Ok to schedule one day next week with me for FU. Richardson Dopp, PA-C   10/30/2016 3:06 PM

## 2016-10-30 NOTE — Telephone Encounter (Signed)
Patient has apt with Dr. Kathlen Mody 11/10/16 and daughter [name] is wanting to know if her appoint should be moved sooner due to her elevated BNP and lower extremity swelling. Lab and note sent to Dr. Kathlen Mody to see if he wants patient to follow up sooner.

## 2016-10-30 NOTE — Telephone Encounter (Signed)
DPR of to s/w daughter [name]. I asked daughter if pt has any increased swelling, sob or weight gain.   Daughter [name] states to me that the pt has had increased edema, sob. I orignally advised for the pt to follow recommendations per Richardson Dopp, PA to take extra dose of lasix x 2 days. Daughter [name] states pt increased her lasix on her own about 11/2 weeks to lasix 40 mg daily up from lasix 20 mg daily. I advised pt needs to limit salt in her diet and though she may not be adding salt additionally, there is so much salt in foods already. Daughter states this is a problem for the pt and states the pt needs to stop eating the "junk" that she has been eating. Daughter states this real;y needs to be stressed to the pt through the dr because she is not listening to her.   I advised I pt at this time let me d/w Brynda Rim. PA. Advised I will need to let him know that the pt increased lasix to 40 mg and what new recommendations does he have for the pt. I advised daughter to make sure pt keeps her appt 11/21. Daughter agreeable to plan of care.

## 2016-11-06 ENCOUNTER — Other Ambulatory Visit: Payer: Medicare HMO | Admitting: *Deleted

## 2016-11-06 DIAGNOSIS — I10 Essential (primary) hypertension: Secondary | ICD-10-CM

## 2016-11-06 DIAGNOSIS — L7211 Pilar cyst: Secondary | ICD-10-CM | POA: Diagnosis not present

## 2016-11-06 DIAGNOSIS — R208 Other disturbances of skin sensation: Secondary | ICD-10-CM | POA: Diagnosis not present

## 2016-11-06 LAB — BASIC METABOLIC PANEL
BUN: 14 mg/dL (ref 7–25)
CALCIUM: 9 mg/dL (ref 8.6–10.4)
CO2: 28 mmol/L (ref 20–31)
Chloride: 102 mmol/L (ref 98–110)
Creat: 0.79 mg/dL (ref 0.60–0.93)
GLUCOSE: 116 mg/dL — AB (ref 65–99)
Potassium: 4 mmol/L (ref 3.5–5.3)
Sodium: 140 mmol/L (ref 135–146)

## 2016-11-06 NOTE — Addendum Note (Signed)
Addended by: Eulis Foster on: 11/06/2016 02:06 PM   Modules accepted: Orders

## 2016-11-10 ENCOUNTER — Ambulatory Visit (INDEPENDENT_AMBULATORY_CARE_PROVIDER_SITE_OTHER): Payer: Medicare HMO | Admitting: Physician Assistant

## 2016-11-10 ENCOUNTER — Encounter: Payer: Self-pay | Admitting: Physician Assistant

## 2016-11-10 VITALS — BP 118/60 | HR 98 | Ht 67.0 in | Wt 204.0 lb

## 2016-11-10 DIAGNOSIS — R0602 Shortness of breath: Secondary | ICD-10-CM

## 2016-11-10 DIAGNOSIS — E7800 Pure hypercholesterolemia, unspecified: Secondary | ICD-10-CM

## 2016-11-10 DIAGNOSIS — I5032 Chronic diastolic (congestive) heart failure: Secondary | ICD-10-CM

## 2016-11-10 DIAGNOSIS — I482 Chronic atrial fibrillation: Secondary | ICD-10-CM | POA: Diagnosis not present

## 2016-11-10 DIAGNOSIS — I10 Essential (primary) hypertension: Secondary | ICD-10-CM

## 2016-11-10 DIAGNOSIS — I2510 Atherosclerotic heart disease of native coronary artery without angina pectoris: Secondary | ICD-10-CM

## 2016-11-10 DIAGNOSIS — I4821 Permanent atrial fibrillation: Secondary | ICD-10-CM

## 2016-11-10 MED ORDER — FUROSEMIDE 40 MG PO TABS: 60.0000 mg | ORAL_TABLET | Freq: Every day | ORAL | 3 refills | Status: DC

## 2016-11-10 MED ORDER — POTASSIUM CHLORIDE CRYS ER 20 MEQ PO TBCR: 20.0000 meq | EXTENDED_RELEASE_TABLET | Freq: Every day | ORAL | 3 refills | Status: DC

## 2016-11-10 NOTE — Patient Instructions (Addendum)
Medication Instructions:  1. INCREASE LASIX TO 60 MG DAILY; NEW RX WAS SENT IN WITH THE NEW DIRECTIONS 2. START POTASSIUM 20 MEQ DAILY; RX HAS BEEN SENT IN  Labwork: 1. LAB WORK TO BE DONE IN 1 WEEK BMET, BNP; (YOU HAVE ALSO BEEN GIVEN AN RX FOR THE LAB WORK TO BE DONE AT DR. MOORE'S OFFICE IF YOU WOULD LIKE); IF SO PLEASE MAKE SURE RESULTS ARE FAXED TO [place], [place]  Testing/Procedures: 1. Your physician has requested that you have a lexiscan myoview. For further information please visit [email or web address]. Please follow instruction sheet, as given.  Follow-Up: SCOTT WEAVER, PAC 3-4 WEEKS SAME DAY DR. Burt Knack IS IN THE OFFICE  Any Other Special Instructions Will Be Listed Below (If Applicable).  Low-Sodium Eating Plan -Sodium raises blood pressure and causes water to be held in the body. Getting less sodium from food will help lower your blood pressure, reduce any swelling, and protect your heart, liver, and kidneys. We get sodium by adding salt (sodium chloride) to food. Most of our sodium comes from canned, boxed, and frozen foods. Restaurant foods, fast foods, and pizza are also very high in sodium. Even if you take medicine to lower your blood pressure or to reduce fluid in your body, getting less sodium from your food is important. What is my plan? Most people should limit their sodium intake to 2,300 mg a day. Your health care provider recommends that you limit your sodium intake to __________ a day. What do I need to know about this eating plan? For the low-sodium eating plan, you will follow these general guidelines:  Choose foods with a % Daily Value for sodium of less than 5% (as listed on the food label).  Use salt-free seasonings or herbs instead of table salt or sea salt.  Check with your health care provider or pharmacist before using salt substitutes.  Eat fresh foods.  Eat more vegetables and fruits.  Limit canned vegetables. If you do use them, rinse  them well to decrease the sodium.  Limit cheese to 1 oz (28 g) per day.  Eat lower-sodium products, often labeled as "lower sodium" or "no salt added."  Avoid foods that contain monosodium glutamate (MSG). MSG is sometimes added to [place] food and some canned foods.  Check food labels (Nutrition Facts labels) on foods to learn how much sodium is in one serving.  Eat more home-cooked food and less restaurant, buffet, and fast food.  When eating at a restaurant, ask that your food be prepared with less salt, or no salt if possible. How do I read food labels for sodium information? The Nutrition Facts label lists the amount of sodium in one serving of the food. If you eat more than one serving, you must multiply the listed amount of sodium by the number of servings. Food labels may also identify foods as:  Sodium free-Less than 5 mg in a serving.  Very low sodium-35 mg or less in a serving.  Low sodium-140 mg or less in a serving.  Light in sodium-50% less sodium in a serving. For example, if a food that usually has 300 mg of sodium is changed to become light in sodium, it will have 150 mg of sodium.  Reduced sodium-25% less sodium in a serving. For example, if a food that usually has 400 mg of sodium is changed to reduced sodium, it will have 300 mg of sodium. What foods can I eat? Grains  Low-sodium cereals, including oats, puffed  wheat and rice, and shredded wheat cereals. Low-sodium crackers. Unsalted rice and pasta. Lower-sodium bread. Vegetables  Frozen or fresh vegetables. Low-sodium or reduced-sodium canned vegetables. Low-sodium or reduced-sodium tomato sauce and paste. Low-sodium or reduced-sodium tomato and vegetable juices. Fruits  Fresh, frozen, and canned fruit. Fruit juice. Meat and Other Protein Products  Low-sodium canned tuna and salmon. Fresh or frozen meat, poultry, seafood, and fish. Lamb. Unsalted nuts. Dried beans, peas, and lentils without added salt. Unsalted  canned beans. Homemade soups without salt. Eggs. Dairy  Milk. Soy milk. Ricotta cheese. Low-sodium or reduced-sodium cheeses. Yogurt. Condiments  Fresh and dried herbs and spices. Salt-free seasonings. Onion and garlic powders. Low-sodium varieties of mustard and ketchup. Fresh or refrigerated horseradish. Lemon juice. Fats and Oils  Reduced-sodium salad dressings. Unsalted butter. Other  Unsalted popcorn and pretzels. The items listed above may not be a complete list of recommended foods or beverages. Contact your dietitian for more options.  What foods are not recommended? Grains  Instant hot cereals. Bread stuffing, pancake, and biscuit mixes. Croutons. Seasoned rice or pasta mixes. Noodle soup cups. Boxed or frozen macaroni and cheese. Self-rising flour. Regular salted crackers. Vegetables  Regular canned vegetables. Regular canned tomato sauce and paste. Regular tomato and vegetable juices. Frozen vegetables in sauces. Salted [place] fries. Olives. Angie Fava. Relishes. Sauerkraut. Salsa. Meat and Other Protein Products  Salted, canned, smoked, spiced, or pickled meats, seafood, or fish. Bacon, ham, sausage, hot dogs, corned beef, chipped beef, and packaged luncheon meats. Salt pork. Jerky. Pickled herring. Anchovies, regular canned tuna, and sardines. Salted nuts. Dairy  Processed cheese and cheese spreads. Cheese curds. Blue cheese and cottage cheese. Buttermilk. Condiments  Onion and garlic salt, seasoned salt, table salt, and sea salt. Canned and packaged gravies. Worcestershire sauce. Tartar sauce. Barbecue sauce. Teriyaki sauce. Soy sauce, including reduced sodium. Steak sauce. Fish sauce. Oyster sauce. Cocktail sauce. Horseradish that you find on the shelf. Regular ketchup and mustard. Meat flavorings and tenderizers. Bouillon cubes. Hot sauce. Tabasco sauce. Marinades. Taco seasonings. Relishes. Fats and Oils  Regular salad dressings. Salted butter. Margarine. Ghee. Bacon fat. Other   Potato and tortilla chips. Corn chips and puffs. Salted popcorn and pretzels. Canned or dried soups. Pizza. Frozen entrees and pot pies. The items listed above may not be a complete list of foods and beverages to avoid. Contact your dietitian for more information.  This information is not intended to replace advice given to you by your health care provider. Make sure you discuss any questions you have with your health care provider. Document Released: 05/29/2002 Document Revised: 05/14/2016 Document Reviewed: 10/11/2013 Elsevier Interactive Patient Education  2017 [employer].  If you need a refill on your cardiac medications before your next appointment, please call your pharmacy.

## 2016-11-10 NOTE — Progress Notes (Signed)
Cardiology Office Note:    Date:  11/10/2016   ID:  [name] [name], DOB 1941/09/12, MRN [id number]  PCP:  [name] Gainer, MD  Cardiologist:  Dr. Kirk Ruths McLean>>patient requests Dr. Sherren Mocha   Electrophysiologist:  N/a Ortho/Pain:  Dr. Nelva Bush  Referring MD: [name], MD   Chief Complaint  Patient presents with  . Leg Swelling    History of Present Illness:    [name] [name] is a 75 y.o. female with a hx of CAD, diastolic HF, HTN, HL, PAF. She has had multiple back surgeries and is on chronic narcotic pain medication. She has a hx of orthostatic hypotension and multiple falls as well as confusion. She had previously been kept off of anticoagulation. She had chest pain in 12/15 and Myoview was abnormal. LHC demonstrated complex bifurcational distal LM/ostial LCx disease. She was felt to be a poor CABG candidate due to difficulty with rehab. She underwent bifurcational PCI with DES to the distal LM and ostial LCx.   I saw her in 1/17 and she was noted to be in AF with RVR when she presented to [facility] for pre-op workup for upcoming colonoscopy. We put her on Eliquis 5 bid and Toprol. Echo 01/29/16 demonstrated normal LVF, mod LAE, mild MR and PASP 37 mmHg. TSH was normal.  She underwent DCCV in 2/17.  But, she had recurrent AF after DCCV.  She was seen in the AFib Clinic but was back in NSR.  She has had return of AFib since.  She has not felt any different in NSR vs AFib.  Therefore, she has been tx with a rate control strategy.    Last seen by me 08/10/16.  Holter monitor in 9/17 showed fair HR control with avg HR 97.  She had a BNP obtained by her PCP recently that was minimally elevated.  The labs were sent to me and we contacted the patient.  She did have some increased edema and I had her take extra Lasix for 3 days.    She returns for FU.  She is here with her daughter.  The patient notes that her legs have been more swollen for a few weeks.  She did have some  improvement with the extra Lasix for a few days.  She has chronic dyspnea on exertion.  She feels this may be getting worse.  She denies syncope, orthopnea, PND.  She denies cough or wheezing.   Prior CV studies that were reviewed today include:    Holter 09/10/16 he basic rhythm is atrial fibrillation with average HR 97 bpm There are occasional ventricular ectopics No other arrhythmia identified No bradycardic events  Holter 3/17  The baseline rhythm is atrial fibrillation  The average ventricular rate is 96 bpm  There are periods of rapid atrial fib and slow atrial fib with pauses up to 3 seconds  There are single PVC's or aberrated beats with a rare ventricular couplet  Echo 01/29/16 EF 50-55%, trivial AI, mild MR, mod LAE, PASP 37 mmHg  LHC 1/16 LM dist ? 50% LAD prox 30% - FFR 0.82 LCx ostial 70% - FFR 0.86 >> 0.66 RCA mid 40% CABG deferred b/c of poor functional capacity PCI: Complex bifurcational distal LM and ostial LCx with rotational atherectomy and IVUS guided with 2.75 x 12 mm Synergy DES to ostial LCx; 4 x 16 mm Synergy DES to distal LM  Myoview 12/15   Intermediate risk stress nuclear study Moderate area of anteroapical and anteroseptal ischemia Also  inferolateral wall infarct with moderate peri infarct ischemia Study suggests multi vessel diseae. LV Ejection Fraction: 57%  Echo 7/11 Mild LVH, EF 55-60%, no RWMA, mild MR  Past Medical History:  Diagnosis Date  . Anemia   . Anxiety   . Arthritis    "knees, back, fingers, toes; joints" (01/07/2015)  . CAD (coronary artery disease)    a. Abnl nuc 11/2014. Cath 12/2014 - turned down for CABG. Ultimately s/p TTVP, rotational atherectomy, PTCA and stenting of the ostial LCx and left main into the LAD (crush technique), and IVUS of the LAD/Left main.  . Chronic atrial fibrillation ([place])    a.  First noted post-op 9/15 spinal fusion. She had cardioversion, not on anticoagulation. Fall risk, unsteady. // failed  DCCV // Holter 10/17: AFib, Avg HR 97, PVCs, no other arrhythmia  . Chronic back pain greater than 3 months duration    a. spinal stenosis. Spinal fusion with rods in 2/15 at [facility] spinal fusion 9/15.  Marland Kitchen Chronic diastolic CHF (congestive heart failure) ([place])   . Circadian rhythm sleep disorder   . CTS (carpal tunnel syndrome)   . Depression   . Diarrhea   . Diverticulitis of colon   . Esophageal stricture   . Gastritis   . GERD (gastroesophageal reflux disease)   . Hiatal hernia   . History of blood transfusion    "most of them related to OR's"   . History of echocardiogram    a. Echo 2/17:  EF 50-55%, trivial AI, midl MR, mod LAE, PASP 37 mmHg  . HTN (hypertension)   . Hyperlipidemia   . IBS (irritable bowel syndrome)   . Insomnia   . Memory disorder 12/04/2014  . MGUS (monoclonal gammopathy of unknown significance) dx'd 11/2014   a. Neg BMB 11/2014.  . Multiple falls   . Obesity   . Obstructive sleep apnea    "have mask; don't wear it" (01/07/2015)  . Orthostasis   . PAT (paroxysmal atrial tachycardia) ([place])   . Personal history of colonic polyps 10/25/2011 & 12/02/11   not retrieved Dr Lyla Son & tubular adenomas  . Pneumonia 03/2014  . Sinus bradycardia    a. Baseline HR 50s-60s.  . Stroke [facility]) early 2000's   "small"; denies residual on 01/07/2015)  1. Chronic low back pain: spinal stenosis. Spinal fusion with rods in 2/15 at [facility]. Repeat spinal fusion 9/15.  2. Obesity 3. H/o diverticulitis 4. IBS 5. H/o colon polyps 6. GERD: h/o hiatal hernia 7. OSA 8. HTN 9. Hyperlipidemia 10. Depression 11. Cholecystectomy 12. H/o bilateral TKR 13. CAD: LHC (5/09) with 50% ostial LCx, 30-40% mRCA, 30% mLAD. Lexiscan Cardiolite in 11/14 with EF 68%, no ischemia or infarction. Lexiscan Cardiolite (12/15) with EF 57%, moderate anteroapical and anteroseptal ischemia, inferolateral infarct with peri-infarct ischemia =>suggestive of multivessel disease. LHC  (1/16) with severe distal left main and ostial LCx stenosis confirmed by FFR. Patient had bifurcation stenting of the distal LM/ostial LCx with rotational atherectomy.  14. PNA in 3/15 15. Atrial fibrillation: Paroxysmal. Has only been noted post-op 9/15 spinal fusion. She had cardioversion, not on anticoagulation.  16. MGUS: Negative bone marrow biopsy 12/15.  17. Anemia  Past Surgical History:  Procedure Laterality Date  . BACK SURGERY    . BONE MARROW BIOPSY  11/2014  . CARDIAC CATHETERIZATION  12/27/2014   Procedure: INTRAVASCULAR PRESSURE WIRE/FFR STUDY;  Surgeon: Larey Dresser, MD;  Location: [facility] CATH LAB;  Service: Cardiovascular;;  . CARDIOVERSION N/A  02/13/2016   Procedure: CARDIOVERSION;  Surgeon: Josue Hector, MD;  Location: [facility] ENDOSCOPY;  Service: Cardiovascular;  Laterality: N/A;  . CARPAL TUNNEL RELEASE Right 1980's  . CATARACT EXTRACTION Bilateral   . CATARACT EXTRACTION W/ INTRAOCULAR LENS  IMPLANT, BILATERAL  2000's  . CORONARY ANGIOPLASTY WITH STENT PLACEMENT  01/07/2015   "2"  . CORONARY STENT PLACEMENT    . DILATION AND CURETTAGE OF UTERUS    . ESOPHAGOGASTRODUODENOSCOPY (EGD) WITH ESOPHAGEAL DILATION  "several times"  . JOINT REPLACEMENT    . KNEE ARTHROSCOPY Left 1995  . LAPAROSCOPIC CHOLECYSTECTOMY  2003  . LEFT HEART CATHETERIZATION WITH CORONARY ANGIOGRAM N/A 12/27/2014   Procedure: LEFT HEART CATHETERIZATION WITH CORONARY ANGIOGRAM;  Surgeon: Larey Dresser, MD;  Location: [facility] CATH LAB;  Service: Cardiovascular;  Laterality: N/A;  . PERCUTANEOUS CORONARY ROTOBLATOR INTERVENTION (PCI-R)  01/07/2015  . PERCUTANEOUS CORONARY ROTOBLATOR INTERVENTION (PCI-R) N/A 01/07/2015   Procedure: PERCUTANEOUS CORONARY ROTOBLATOR INTERVENTION (PCI-R);  Surgeon: Blane Ohara, MD;  Location: [facility] CATH LAB;  Service: Cardiovascular;  Laterality: N/A;  . POSTERIOR FUSION THORACIC SPINE  08/2015  . POSTERIOR LUMBAR FUSION  01/2015  . TOTAL KNEE ARTHROPLASTY Bilateral 1990's -  2000's    Current Medications: Current Meds  Medication Sig  . apixaban (ELIQUIS) 5 MG TABS tablet Take 1 tablet (5 mg total) by mouth 2 (two) times daily.  . Calcium Citrate-Vitamin D (CALCIUM + D PO) Take 1 tablet by mouth daily.  . Cholecalciferol (VITAMIN D) 2000 units tablet Take 2,000 Units by mouth daily.  . colestipol (COLESTID) 1 g tablet Take 2 tablets (2 g total) by mouth 2 (two) times daily.  Marland Kitchen diltiazem (CARDIZEM CD) 120 MG 24 hr capsule Take 1 capsule (120 mg total) by mouth 2 (two) times daily.  . diphenoxylate-atropine (LOMOTIL) 2.5-0.025 MG per tablet Take 1 tablet by mouth 4 (four) times daily as needed for diarrhea or loose stools.   . Eszopiclone 3 MG TABS Take 1 tablet (3 mg total) by mouth at bedtime. For insomnia,take immediately before bedtime.  . fentaNYL (DURAGESIC - DOSED MCG/HR) 50 MCG/HR Place 1 patch (50 mcg total) onto the skin every 3 (three) days.  Marland Kitchen FLUoxetine (PROZAC) 40 MG capsule Take 1 capsule by mouth  daily for depression  . HYDROcodone-acetaminophen (NORCO) 10-325 MG per tablet Take 1 tablet by mouth every 4 (four) hours as needed for moderate pain.   Marland Kitchen LORazepam (ATIVAN) 0.5 MG tablet Take 1 tablet (0.5 mg total) by mouth 2 (two) times daily as needed for anxiety.  . meclizine (ANTIVERT) 25 MG tablet Take 1 tablet (25 mg total) by mouth 3 (three) times daily as needed for dizziness.  . methocarbamol (ROBAXIN) 500 MG tablet Take 500 mg by mouth 4 (four) times daily as needed for muscle spasms.  . Multiple Vitamin (MULTIVITAMIN) tablet Take 1 tablet by mouth daily. For supplement  . NARCAN 4 MG/0.1ML LIQD Place 1 spray into the nose as directed. For drug overdose  . nitroGLYCERIN (NITROSTAT) 0.4 MG SL tablet Place 1 tablet (0.4 mg total) under the tongue every 5 (five) minutes as needed for chest pain (up to 3 doses).  . pantoprazole (PROTONIX) 40 MG tablet Take 1 tablet (40 mg total) by mouth 2 (two) times daily.  . rosuvastatin (CRESTOR) 20 MG tablet  Patient alternating taking 1 whole tablet (20 mg) by mouth once daily and 1/2 tablet (10 mg ) by mouth once daily the next day.   . [DISCONTINUED] furosemide (LASIX) 40  MG tablet Take 40 mg by mouth daily.      Allergies:   Cymbalta [duloxetine hcl]; Morphine and related; Oxycodone-acetaminophen; Oxycodone-acetaminophen; Percocet [oxycodone-acetaminophen]; Valium [diazepam]; Duloxetine; Morphine; Altace [ramipril]; Floxin [ofloxacin]; Hydromorphone; Lipitor [atorvastatin]; Lovaza [omega-3-acid ethyl esters]; Pravachol [pravastatin sodium]; Trilipix [choline fenofibrate]; Zetia [ezetimibe]; and Zocor [simvastatin]   Social History   Social History  . Marital status: Widowed    Spouse name: N/A  . Number of children: 2  . Years of education: HS   Occupational History  . librarian- retired     retired   Social History Main Topics  . Smoking status: Never Smoker  . Smokeless tobacco: Never Used  . Alcohol use No     Comment: 01/07/2015 "glass of wine at Christmas, maybe"  . Drug use: No  . Sexual activity: No   Other Topics Concern  . None   Social History Narrative   Patient is right handed   Patient drinks 1-2 sodas daily.   patlient lives alone.        Family History:  The patient's family history includes Colon cancer in her sister and sister; Coronary artery disease in her brother, father, and mother; Emphysema in her brother and sister; Peripheral vascular disease in her father; Sleep apnea in her son.   ROS:   Please see the history of present illness.    Review of Systems  Constitution: Positive for malaise/fatigue and weight gain.  Cardiovascular: Positive for dyspnea on exertion, irregular heartbeat and leg swelling.  Musculoskeletal: Positive for back pain and myalgias.  Gastrointestinal: Positive for diarrhea.  Neurological: Positive for loss of balance.  Psychiatric/Behavioral: The patient is nervous/anxious.    All other systems reviewed and are  negative.   EKGs/Labs/Other Test Reviewed:    EKG:  EKG is  ordered today.  The ekg ordered today demonstrates AFib, HR 99  Recent Labs: 06/22/2016: TSH 4.340 07/02/2016: HGB 13.9 10/27/2016: ALT 43; BNP 215.8; Platelets 220 11/06/2016: BUN 14; Creat 0.79; Potassium 4.0; Sodium 140   Recent Lipid Panel    Component Value Date/Time   CHOL 146 10/27/2016 1539   CHOL 156 05/11/2013 1536   TRIG 154 (H) 10/27/2016 1539   TRIG 121 02/18/2016 1504   TRIG 239 (H) 05/11/2013 1536   HDL 64 10/27/2016 1539   HDL 57 02/18/2016 1504   HDL 43 05/11/2013 1536   CHOLHDL 2.3 10/27/2016 1539   LDLCALC 51 10/27/2016 1539   LDLCALC 50 08/21/2014 1141   LDLCALC 65 05/11/2013 1536     Physical Exam:    VS:  BP 118/60   Pulse 98   Ht 5' 7"  (1.702 m)   Wt 204 lb (92.5 kg)   BMI 31.95 kg/m     Wt Readings from Last 3 Encounters:  11/10/16 204 lb (92.5 kg)  08/10/16 197 lb 1.9 oz (89.4 kg)  07/02/16 199 lb 4.8 oz (90.4 kg)     Physical Exam  Constitutional: She is oriented to person, place, and time. She appears well-developed and well-nourished. No distress.  HENT:  Head: Normocephalic and atraumatic.  Eyes: No scleral icterus.  Neck: No JVD present.  Cardiovascular: Normal rate.  An irregularly irregular rhythm present.  No murmur heard. Pulmonary/Chest: Effort normal. She has no wheezes. She has no rales.  Abdominal: She exhibits distension. There is no tenderness.  Musculoskeletal: She exhibits edema.  Trace-1+ bilateral LE edema  Neurological: She is alert and oriented to person, place, and time.  Skin: Skin is warm and dry.  Psychiatric: She has a normal mood and affect.    ASSESSMENT:    1. Chronic diastolic CHF (congestive heart failure) ([place])   2. Permanent atrial fibrillation ([place])   3. Coronary artery disease involving native coronary artery of native heart without angina pectoris   4. Essential hypertension   5. Pure hypercholesterolemia   6. Shortness of breath     PLAN:    In order of problems listed above:  1. Chronic diastolic CHF - She notes increasing LE edema and worsening dyspnea on exertion.  Her BNP was recently mildly elevated.  She has had some improvement in her edema with taking extra Lasix for a few days.  She does eat a lot of salt.  We discussed limiting her salt to 2 gm Na a day.  She still has some evidence of volume excess on exam.  I will keep her on Lasix 60 mg QD.  Check BMET, BNP in 1 week.   2. Chronic AFib - s/p failed DCCV. Rate with fair control. Recent Creatinine and Hgb normal.   -  Continue Eliquis and Dilt to 120 bid  3. CAD - She denies chest pain.  But, she feels her shortness of breath is getting worse.  It has been 2 year since her PCI.  She is no longer on ASA or Plavix as she is on Eliquis.   -  Arrange Lexiscan Myoview  -  Continue statin.   4. HTN - Controlled.   5. HL - LDL in 11/17 was 51.  Continue Crestor 20.  6. Dyspnea - Prob multifactorial and related to deconditioning, CAD, CHF.  I will obtain a nuclear stress test first.  If this is low risk and her dyspnea continues, consider CPX test or refer to Pulmonology.     Medication Adjustments/Labs and Tests Ordered: Current medicines are reviewed at length with the patient today.  Concerns regarding medicines are outlined above.  Medication changes, Labs and Tests ordered today are outlined in the Patient Instructions noted below. Patient Instructions  Medication Instructions:  1. INCREASE LASIX TO 60 MG DAILY; NEW RX WAS SENT IN WITH THE NEW DIRECTIONS 2. START POTASSIUM 20 MEQ DAILY; RX HAS BEEN SENT IN  Labwork: 1. LAB WORK TO BE DONE IN 1 WEEK BMET, BNP; (YOU HAVE ALSO BEEN GIVEN AN RX FOR THE LAB WORK TO BE DONE AT DR. MOORE'S OFFICE IF YOU WOULD LIKE); IF SO PLEASE MAKE SURE RESULTS ARE FAXED TO [place], [place]  Testing/Procedures: 1. Your physician has requested that you have a lexiscan myoview. For further information please  visit [email or web address]. Please follow instruction sheet, as given.  Follow-Up: Romney Compean, PAC 3-4 WEEKS SAME DAY DR. Burt Knack IS IN THE OFFICE  Any Other Special Instructions Will Be Listed Below (If Applicable). If you need a refill on your cardiac medications before your next appointment, please call your pharmacy.    Signed, Richardson Dopp, PA-C  11/10/2016 [facility] [address], [place], [place]  [address] Phone: [phone]; Fax: [phone]

## 2016-11-17 ENCOUNTER — Telehealth (HOSPITAL_COMMUNITY): Payer: Self-pay | Admitting: *Deleted

## 2016-11-17 ENCOUNTER — Other Ambulatory Visit: Payer: Medicare HMO

## 2016-11-17 DIAGNOSIS — I5032 Chronic diastolic (congestive) heart failure: Secondary | ICD-10-CM

## 2016-11-17 DIAGNOSIS — R7989 Other specified abnormal findings of blood chemistry: Secondary | ICD-10-CM | POA: Diagnosis not present

## 2016-11-17 DIAGNOSIS — R0602 Shortness of breath: Secondary | ICD-10-CM

## 2016-11-17 DIAGNOSIS — R945 Abnormal results of liver function studies: Secondary | ICD-10-CM

## 2016-11-17 NOTE — Telephone Encounter (Signed)
Left message on voicemail in reference to upcoming appointment scheduled for 11/19/16. Phone number given for a call back so details instructions can be given. [name] [name], Ranae Palms

## 2016-11-18 ENCOUNTER — Telehealth (HOSPITAL_COMMUNITY): Payer: Self-pay | Admitting: *Deleted

## 2016-11-18 LAB — HEPATIC FUNCTION PANEL
ALK PHOS: 142 IU/L — AB (ref 39–117)
ALT: 29 IU/L (ref 0–32)
AST: 26 IU/L (ref 0–40)
Albumin: 4.2 g/dL (ref 3.5–4.8)
BILIRUBIN, DIRECT: 0.19 mg/dL (ref 0.00–0.40)
Bilirubin Total: 0.5 mg/dL (ref 0.0–1.2)
Total Protein: 6.6 g/dL (ref 6.0–8.5)

## 2016-11-18 LAB — BMP8+EGFR
BUN / CREAT RATIO: 16 (ref 12–28)
BUN: 15 mg/dL (ref 8–27)
CO2: 29 mmol/L (ref 18–29)
CREATININE: 0.94 mg/dL (ref 0.57–1.00)
Calcium: 9.4 mg/dL (ref 8.7–10.3)
Chloride: 98 mmol/L (ref 96–106)
GFR calc non Af Amer: 59 mL/min/{1.73_m2} — ABNORMAL LOW (ref >59)
GFR, EST AFRICAN AMERICAN: 68 mL/min/{1.73_m2} (ref >59)
GLUCOSE: 110 mg/dL — AB (ref 65–99)
Potassium: 4.8 mmol/L (ref 3.5–5.2)
SODIUM: 142 mmol/L (ref 134–144)

## 2016-11-18 LAB — BRAIN NATRIURETIC PEPTIDE: BNP: 257.4 pg/mL — AB (ref 0.0–100.0)

## 2016-11-18 NOTE — Telephone Encounter (Signed)
Patient given detailed instructions per Myocardial Perfusion Study Information Sheet for the test on 11/19/16 at 10:00. Patient notified to arrive 15 minutes early and that it is imperative to arrive on time for appointment to keep from having the test rescheduled.  If you need to cancel or reschedule your appointment, please call the office within 24 hours of your appointment. Failure to do so may result in a cancellation of your appointment, and a $50 no show fee. Patient verbalized understanding.[name] [name]

## 2016-11-19 ENCOUNTER — Ambulatory Visit (HOSPITAL_COMMUNITY): Payer: Medicare HMO | Attending: Internal Medicine

## 2016-11-19 VITALS — Ht 67.0 in | Wt 204.0 lb

## 2016-11-19 DIAGNOSIS — I10 Essential (primary) hypertension: Secondary | ICD-10-CM | POA: Insufficient documentation

## 2016-11-19 DIAGNOSIS — E785 Hyperlipidemia, unspecified: Secondary | ICD-10-CM | POA: Diagnosis not present

## 2016-11-19 DIAGNOSIS — R110 Nausea: Secondary | ICD-10-CM

## 2016-11-19 DIAGNOSIS — I2510 Atherosclerotic heart disease of native coronary artery without angina pectoris: Secondary | ICD-10-CM

## 2016-11-19 DIAGNOSIS — I4891 Unspecified atrial fibrillation: Secondary | ICD-10-CM | POA: Insufficient documentation

## 2016-11-19 DIAGNOSIS — R0602 Shortness of breath: Secondary | ICD-10-CM | POA: Insufficient documentation

## 2016-11-19 LAB — MYOCARDIAL PERFUSION IMAGING
CHL CUP NUCLEAR SDS: 7
CHL CUP NUCLEAR SRS: 3
CSEPPHR: 107 {beats}/min
LHR: 0.29
LV dias vol: 102 mL (ref 46–106)
LVSYSVOL: 53 mL
Rest HR: 89 {beats}/min
SSS: 10
TID: 1

## 2016-11-19 MED ORDER — TECHNETIUM TC 99M TETROFOSMIN IV KIT
11.0000 | PACK | Freq: Once | INTRAVENOUS | Status: AC | PRN
  Administered 2016-11-19: 11 via INTRAVENOUS
  Filled 2016-11-19: qty 11

## 2016-11-19 MED ORDER — REGADENOSON 0.4 MG/5ML IV SOLN
0.4000 mg | Freq: Once | INTRAVENOUS | Status: AC
  Administered 2016-11-19: 0.4 mg via INTRAVENOUS

## 2016-11-19 MED ORDER — AMINOPHYLLINE 25 MG/ML IV SOLN
75.0000 mg | Freq: Once | INTRAVENOUS | Status: AC
  Administered 2016-11-19: 75 mg via INTRAVENOUS

## 2016-11-19 MED ORDER — TECHNETIUM TC 99M TETROFOSMIN IV KIT
31.7000 | PACK | Freq: Once | INTRAVENOUS | Status: AC | PRN
  Administered 2016-11-19: 31.7 via INTRAVENOUS
  Filled 2016-11-19: qty 32

## 2016-11-20 ENCOUNTER — Encounter: Payer: Self-pay | Admitting: Physician Assistant

## 2016-11-20 ENCOUNTER — Telehealth: Payer: Self-pay | Admitting: *Deleted

## 2016-11-20 NOTE — Telephone Encounter (Signed)
DPR for daughter [name] who has been notified of pt's myoview results and findings. Pt's daughter is agreeable to appt 12/4 with Brynda Rim. PA 2:15 same day Dr. Burt Knack is in the office to discuss possible cath.

## 2016-11-23 ENCOUNTER — Encounter: Payer: Self-pay | Admitting: Cardiology

## 2016-11-23 ENCOUNTER — Ambulatory Visit: Payer: Medicare HMO | Admitting: Family Medicine

## 2016-11-23 ENCOUNTER — Ambulatory Visit (INDEPENDENT_AMBULATORY_CARE_PROVIDER_SITE_OTHER): Payer: Medicare HMO | Admitting: Cardiology

## 2016-11-23 ENCOUNTER — Ambulatory Visit
Admission: RE | Admit: 2016-11-23 | Discharge: 2016-11-23 | Disposition: A | Discharge status: Home / Self Care | Payer: Medicare HMO | Source: Ambulatory Visit | Attending: Cardiology | Admitting: Cardiology

## 2016-11-23 VITALS — BP 120/76 | HR 71 | Ht 67.0 in | Wt 200.0 lb

## 2016-11-23 DIAGNOSIS — I4821 Permanent atrial fibrillation: Secondary | ICD-10-CM

## 2016-11-23 DIAGNOSIS — I482 Chronic atrial fibrillation: Secondary | ICD-10-CM

## 2016-11-23 DIAGNOSIS — I2510 Atherosclerotic heart disease of native coronary artery without angina pectoris: Secondary | ICD-10-CM

## 2016-11-23 DIAGNOSIS — R0602 Shortness of breath: Secondary | ICD-10-CM

## 2016-11-23 DIAGNOSIS — I10 Essential (primary) hypertension: Secondary | ICD-10-CM

## 2016-11-23 DIAGNOSIS — R931 Abnormal findings on diagnostic imaging of heart and coronary circulation: Secondary | ICD-10-CM | POA: Diagnosis not present

## 2016-11-23 DIAGNOSIS — Z01818 Encounter for other preprocedural examination: Secondary | ICD-10-CM

## 2016-11-23 DIAGNOSIS — I5032 Chronic diastolic (congestive) heart failure: Secondary | ICD-10-CM

## 2016-11-23 DIAGNOSIS — Z9861 Coronary angioplasty status: Secondary | ICD-10-CM

## 2016-11-23 DIAGNOSIS — R918 Other nonspecific abnormal finding of lung field: Secondary | ICD-10-CM | POA: Diagnosis not present

## 2016-11-23 DIAGNOSIS — G8929 Other chronic pain: Secondary | ICD-10-CM

## 2016-11-23 LAB — BASIC METABOLIC PANEL
BUN: 13 mg/dL (ref 7–25)
CHLORIDE: 103 mmol/L (ref 98–110)
CO2: 32 mmol/L — AB (ref 20–31)
Calcium: 9.5 mg/dL (ref 8.6–10.4)
Creat: 0.84 mg/dL (ref 0.60–0.93)
GLUCOSE: 97 mg/dL (ref 65–99)
POTASSIUM: 4.9 mmol/L (ref 3.5–5.3)
Sodium: 140 mmol/L (ref 135–146)

## 2016-11-23 LAB — CBC WITH DIFFERENTIAL/PLATELET
BASOS ABS: 0 {cells}/uL (ref 0–200)
Basophils Relative: 0 %
EOS ABS: 60 {cells}/uL (ref 15–500)
Eosinophils Relative: 1 %
HEMATOCRIT: 39.7 % (ref 35.0–45.0)
Hemoglobin: 12.8 g/dL (ref 11.7–15.5)
LYMPHS PCT: 35 %
Lymphs Abs: 2100 cells/uL (ref 850–3900)
MCH: 30.2 pg (ref 27.0–33.0)
MCHC: 32.2 g/dL (ref 32.0–36.0)
MCV: 93.6 fL (ref 80.0–100.0)
MONO ABS: 360 {cells}/uL (ref 200–950)
MPV: 10.6 fL (ref 7.5–12.5)
Monocytes Relative: 6 %
NEUTROS PCT: 58 %
Neutro Abs: 3480 cells/uL (ref 1500–7800)
Platelets: 195 10*3/uL (ref 140–400)
RBC: 4.24 MIL/uL (ref 3.80–5.10)
RDW: 13.5 % (ref 11.0–15.0)
WBC: 6 10*3/uL (ref 3.8–10.8)

## 2016-11-23 MED ORDER — ASPIRIN EC 81 MG PO TBEC: 81.0000 mg | DELAYED_RELEASE_TABLET | Freq: Every day | ORAL | 3 refills | Status: DC

## 2016-11-23 NOTE — Progress Notes (Signed)
Cardiology Office Note   Date:  11/23/2016   ID:  [name] [name], DOB 1941-06-22, MRN [id number]  PCP:  Redge Gainer, MD  Cardiologist:  Dr. Burt Knack was Dr. Aundra Dubin    Chief Complaint  Patient presents with  . Shortness of Breath    results of nuc      History of Present Illness: [name] [name] is a 75 y.o. female who presents for results of nuc study and eval of her symptoms.   She has a hx of CAD, diastolic HF, HTN, HL, PAF. She has had multiple back surgeries and is on chronic narcotic pain medication. She has a hx of orthostatic hypotension and multiple falls as well as confusion. She had previously been kept off of anticoagulation. She had chest pain in 12/15 and Myoview was abnormal. LHC demonstrated complex bifurcational distal LM/ostial LCx disease. She was felt to be a poor CABG candidate due to difficulty with rehab. She underwent bifurcational PCI with DES to the distal LM and ostial LCx.   Seen in 1/17 and she was noted to be in AF with RVR when she presented to [facility] for pre-op workup for upcoming colonoscopy. We put her on Eliquis 5 bid and Toprol. Echo 01/29/16 demonstrated normal LVF, mod LAE, mild MR and PASP 37 mmHg. TSH was normal. She underwent DCCV in 2/17. But, she had recurrent AF after DCCV.  She was seen in the AFib Clinic but was back in NSR. She has had return of AFib since. She has not felt any different in NSR vs AFib. Therefore, she has been tx with a rate control strategy.   Last visit with lower ext edema and increased SOB.  No chest pain but her SOB has increased since the previous stents, she can hardly walk room to room due to DOE.     When she saw Mr. Kathlen Mody her lasix was increased and her wt is down 4 lbs.  Her edema has improved though some swelling at ankles.  She is very anxious.  She is tearful because she is "scared of what we may want to dot".  She and her daughter discuss she is alo more anxious than she has been and after exertion  she has trembling hands and is anxious.  Her PCP has prescribed ativan but she does not take.    The nuc study is worrisome for restenosis in the LCX.  + ischemia in the basal inferolateral, mid inferior and mid inferolateral location.     Past Medical History:  Diagnosis Date  . Anemia   . Anxiety   . Arthritis    "knees, back, fingers, toes; joints" (01/07/2015)  . CAD (coronary artery disease)    a. Abnl nuc 11/2014. Cath 12/2014 - turned down for CABG. Ultimately s/p TTVP, rotational atherectomy, PTCA and stenting of the ostial LCx and left main into the LAD (crush technique), and IVUS of the LAD/Left main. // b. Myoview 11/17: EF 48, poor quality/significant artifact; inf-lateral, inferior ischemia; Intermediate Risk  . Chronic atrial fibrillation ([place])    a.  First noted post-op 9/15 spinal fusion. She had cardioversion, not on anticoagulation. Fall risk, unsteady. // failed DCCV // Holter 10/17: AFib, Avg HR 97, PVCs, no other arrhythmia  . Chronic back pain greater than 3 months duration    a. spinal stenosis. Spinal fusion with rods in 2/15 at [facility] spinal fusion 9/15.  Marland Kitchen Chronic diastolic CHF (congestive heart failure) ([place])   . Circadian rhythm sleep disorder   .  CTS (carpal tunnel syndrome)   . Depression   . Diarrhea   . Diverticulitis of colon   . Esophageal stricture   . Gastritis   . GERD (gastroesophageal reflux disease)   . Hiatal hernia   . History of blood transfusion    "most of them related to OR's"   . History of echocardiogram    a. Echo 2/17:  EF 50-55%, trivial AI, midl MR, mod LAE, PASP 37 mmHg  . HTN (hypertension)   . Hyperlipidemia   . IBS (irritable bowel syndrome)   . Insomnia   . Memory disorder 12/04/2014  . MGUS (monoclonal gammopathy of unknown significance) dx'd 11/2014   a. Neg BMB 11/2014.  . Multiple falls   . Obesity   . Obstructive sleep apnea    "have mask; don't wear it" (01/07/2015)  . Orthostasis   . PAT (paroxysmal  atrial tachycardia) ([place])   . Personal history of colonic polyps 10/25/2011 & 12/02/11   not retrieved Dr Lyla Son & tubular adenomas  . Pneumonia 03/2014  . Sinus bradycardia    a. Baseline HR 50s-60s.  . Stroke [facility]) early 2000's   "small"; denies residual on 01/07/2015)    Past Surgical History:  Procedure Laterality Date  . BACK SURGERY    . BONE MARROW BIOPSY  11/2014  . CARDIAC CATHETERIZATION  12/27/2014   Procedure: INTRAVASCULAR PRESSURE WIRE/FFR STUDY;  Surgeon: Larey Dresser, MD;  Location: [facility] CATH LAB;  Service: Cardiovascular;;  . CARDIOVERSION N/A 02/13/2016   Procedure: CARDIOVERSION;  Surgeon: Josue Hector, MD;  Location: [facility] ENDOSCOPY;  Service: Cardiovascular;  Laterality: N/A;  . CARPAL TUNNEL RELEASE Right 1980's  . CATARACT EXTRACTION Bilateral   . CATARACT EXTRACTION W/ INTRAOCULAR LENS  IMPLANT, BILATERAL  2000's  . CORONARY ANGIOPLASTY WITH STENT PLACEMENT  01/07/2015   "2"  . CORONARY STENT PLACEMENT    . DILATION AND CURETTAGE OF UTERUS    . ESOPHAGOGASTRODUODENOSCOPY (EGD) WITH ESOPHAGEAL DILATION  "several times"  . JOINT REPLACEMENT    . KNEE ARTHROSCOPY Left 1995  . LAPAROSCOPIC CHOLECYSTECTOMY  2003  . LEFT HEART CATHETERIZATION WITH CORONARY ANGIOGRAM N/A 12/27/2014   Procedure: LEFT HEART CATHETERIZATION WITH CORONARY ANGIOGRAM;  Surgeon: Larey Dresser, MD;  Location: [facility] CATH LAB;  Service: Cardiovascular;  Laterality: N/A;  . PERCUTANEOUS CORONARY ROTOBLATOR INTERVENTION (PCI-R)  01/07/2015  . PERCUTANEOUS CORONARY ROTOBLATOR INTERVENTION (PCI-R) N/A 01/07/2015   Procedure: PERCUTANEOUS CORONARY ROTOBLATOR INTERVENTION (PCI-R);  Surgeon: Blane Ohara, MD;  Location: [facility] CATH LAB;  Service: Cardiovascular;  Laterality: N/A;  . POSTERIOR FUSION THORACIC SPINE  08/2015  . POSTERIOR LUMBAR FUSION  01/2015  . TOTAL KNEE ARTHROPLASTY Bilateral 1990's - 2000's     Current Outpatient Prescriptions  Medication Sig Dispense Refill  . Calcium  Citrate-Vitamin D (CALCIUM + D PO) Take 1 tablet by mouth daily.    . Cholecalciferol (VITAMIN D) 2000 units tablet Take 2,000 Units by mouth daily.    . colestipol (COLESTID) 1 g tablet Take 2 tablets (2 g total) by mouth 2 (two) times daily. 360 tablet 1  . diltiazem (CARDIZEM CD) 120 MG 24 hr capsule Take 1 capsule (120 mg total) by mouth 2 (two) times daily. 180 capsule 3  . diphenoxylate-atropine (LOMOTIL) 2.5-0.025 MG per tablet Take 1 tablet by mouth 4 (four) times daily as needed for diarrhea or loose stools.     . Eszopiclone 3 MG TABS Take 1 tablet (3 mg total) by mouth at bedtime. For  insomnia,take immediately before bedtime. 90 tablet 1  . fentaNYL (DURAGESIC - DOSED MCG/HR) 50 MCG/HR Place 1 patch (50 mcg total) onto the skin every 3 (three) days. 10 patch 0  . FLUoxetine (PROZAC) 40 MG capsule Take 1 capsule by mouth  daily for depression 90 capsule 3  . furosemide (LASIX) 40 MG tablet Take 1.5 tablets (60 mg total) by mouth daily. 135 tablet 3  . HYDROcodone-acetaminophen (NORCO) 10-325 MG per tablet Take 1 tablet by mouth every 4 (four) hours as needed for moderate pain.     Marland Kitchen LORazepam (ATIVAN) 0.5 MG tablet Take 1 tablet (0.5 mg total) by mouth 2 (two) times daily as needed for anxiety. 60 tablet 1  . meclizine (ANTIVERT) 25 MG tablet Take 1 tablet (25 mg total) by mouth 3 (three) times daily as needed for dizziness. 60 tablet 0  . methocarbamol (ROBAXIN) 500 MG tablet Take 500 mg by mouth 4 (four) times daily as needed for muscle spasms.    . Multiple Vitamin (MULTIVITAMIN) tablet Take 1 tablet by mouth daily. For supplement    . NARCAN 4 MG/0.1ML LIQD Place 1 spray into the nose as directed. For drug overdose    . nitroGLYCERIN (NITROSTAT) 0.4 MG SL tablet Place 1 tablet (0.4 mg total) under the tongue every 5 (five) minutes as needed for chest pain (up to 3 doses). 25 tablet 3  . pantoprazole (PROTONIX) 40 MG tablet Take 1 tablet (40 mg total) by mouth 2 (two) times daily. 180  tablet 3  . potassium chloride SA (K-DUR,KLOR-CON) 20 MEQ tablet Take 1 tablet (20 mEq total) by mouth daily. 90 tablet 3  . rosuvastatin (CRESTOR) 20 MG tablet Patient alternating taking 1 whole tablet (20 mg) by mouth once daily and 1/2 tablet (10 mg ) by mouth once daily the next day.     Marland Kitchen aspirin EC 81 MG tablet Take 1 tablet (81 mg total) by mouth daily. 90 tablet 3   No current facility-administered medications for this visit.     Allergies:   Cymbalta [duloxetine hcl]; Morphine and related; Oxycodone-acetaminophen; Oxycodone-acetaminophen; Percocet [oxycodone-acetaminophen]; Valium [diazepam]; Duloxetine; Morphine; Altace [ramipril]; Floxin [ofloxacin]; Hydromorphone; Lipitor [atorvastatin]; Lovaza [omega-3-acid ethyl esters]; Pravachol [pravastatin sodium]; Trilipix [choline fenofibrate]; Zetia [ezetimibe]; and Zocor [simvastatin]    Social History:  The patient  reports that she has never smoked. She has never used smokeless tobacco. She reports that she does not drink alcohol or use drugs.   Family History:  The patient's family history includes Colon cancer in her sister and sister; Coronary artery disease in her brother, father, and mother; Emphysema in her brother and sister; Peripheral vascular disease in her father; Sleep apnea in her son.    ROS:  General:no colds or fevers, + weight down 4 lbs. Skin:no rashes or ulcers HEENT:no blurred vision, no congestion CV:see HPI PUL:see HPI GI:no diarrhea constipation or melena, no indigestion GU:no hematuria, no dysuria MS:no joint pain, no claudication Neuro:no syncope, no lightheadedness Endo:no diabetes, no thyroid disease Psych + anxiety  Wt Readings from Last 3 Encounters:  11/23/16 200 lb (90.7 kg)  11/19/16 204 lb (92.5 kg)  11/10/16 204 lb (92.5 kg)     PHYSICAL EXAM: VS:  BP 120/76   Pulse 71   Ht _0  (1.702 m)   Wt 200 lb (90.7 kg)   BMI 31.32 kg/m  , BMI Body mass index is 31.32 kg/m. General:Pleasant  affect but tearful at times, NAD Skin:Warm and dry, brisk capillary refill HEENT:normocephalic,  sclera clear, mucus membranes moist Neck:supple, no JVD, no bruits  Heart:irreg irreg without murmur, gallup, rub or click Lungs:clear without rales, rhonchi, or wheezes KMV:QVFAP, soft, non tender, + BS, do not palpate liver spleen or masses Ext:no lower ext edema, 2+ pedal pulses, 2+ radial pulses Neuro:alert and oriented X 3, MAE, follows commands, + facial symmetry    EKG:  EKG is not ordered today. See previous EKG 11/10/16   Recent Labs: 06/22/2016: TSH 4.340 07/02/2016: HGB 13.9 10/27/2016: Platelets 220 11/17/2016: ALT 29; BNP 257.4; BUN 15; Creatinine, Ser 0.94; Potassium 4.8; Sodium 142    Lipid Panel    Component Value Date/Time   CHOL 146 10/27/2016 1539   CHOL 156 05/11/2013 1536   TRIG 154 (H) 10/27/2016 1539   TRIG 121 02/18/2016 1504   TRIG 239 (H) 05/11/2013 1536   HDL 64 10/27/2016 1539   HDL 57 02/18/2016 1504   HDL 43 05/11/2013 1536   CHOLHDL 2.3 10/27/2016 1539   LDLCALC 51 10/27/2016 1539   LDLCALC 50 08/21/2014 1141   LDLCALC 65 05/11/2013 1536       Other studies Reviewed: Additional studies/ records that were reviewed today include:   nuc study. 11/19/2016 Study Highlights     The left ventricular ejection fraction is mildly decreased (45-54%).  Nuclear stress EF: 48%.  There was no ST segment deviation noted during stress.  Findings consistent with ischemia.  This is an intermediate risk study.  Defect 1: There is a medium defect of moderate severity present in the basal inferolateral, mid inferior and mid inferolateral location.   This study if of poor quality with significant artifact.  However, there is possible medium size, mild severity reversible defect in the basal and mid inferolateral and mid inferior walls consistent with ischemia in the LCX territory (SDS 4).    Stress Findings from 2015  Lexiscan Cardiolite was done in  12/15 showing normal EF but areas of ischemia concerning for multivessel disease. She has episodes of chest soreness that occur daily.  Not related to exertion.  She also has pain in her back related to recent surgery.    Cath 12/27/14 Conclusions:  Hemodynamically significant distal left main/ostial circumflex stenosis. Borderline significant stenosis extending into the LAD.  Recommendations: Consideration of CABG versus PCI for revascularization. If PCI were chosen, I think the patient would require rotational atherectomy of the left main into the circumflex followed by bifurcation stenting. Would recommend surgical evaluation and if the patient is considered to be a poor candidate for surgery, PCI would be reasonable.  PCI Cath 12/2014   Conclusions:  Successful complex bifurcation stenting of the distal left main and ostial circumflex with adjunctive rotational atherectomy, guided by intravascular ultrasound  ASSESSMENT AND PLAN:  1. Increasing DOE is most likely her angina and now with + nuc study for inf. Lateral ischemia.  Dr. Excell Seltzer has reviewed and is concerned about LCX vessel.  Plan for cardiac cath.  She will begin ASA 81 mg daily.  And no more Eliquis. Until post procedure.  Plan for cath Wed. With Dr. Excell Seltzer.    Discussed risks which she is afraid she will not survive.    The patient understands that risks included but are not limited to stroke (1 in 1000), death (1 in 1000), kidney failure [usually temporary] (1 in 500), bleeding (1 in 200), allergic reaction [possibly serious] (1 in 200).   2. Chronic diastolic CHF - She has noted increasing LE edema and worsening dyspnea on exertion.  Her BNP was recently mildly elevated.  She has had some improvement in her edema with taking extra Lasix for a few days.  She does eat a lot of salt.   limiting her salt to 2 gm Na a day had been discussed on previous visit.   She still has some evidence of volume excess on exam.   Lasix 60 mg QD.     3. Chronic AFib - s/p failedDCCV. Rate with fair control. Recent Creatinine and Hgb normal.              -  Continue Eliquis and Dilt to 120 bid  3. CAD - She denies chest pain.  But, she feels her shortness of breath is getting worse.  It has been 2 year since her PCI.  She is no longer on ASA or Plavix as she is on Eliquis.              -  see above.             -  Continue statin.   4. HTN - Controlled.   5. HL - LDL in 11/17 was 51.  Continue Crestor 20.  6. Dyspnea - Prob multifactorial and related to deconditioning, CAD, CHF.    7. Anxiety follow up with PCP.   Current medicines are reviewed with the patient today.  The patient Has no concerns regarding medicines.  The following changes have been made:  See above Labs/ tests ordered today include:see above  Disposition:   FU:  see above  Signed, Cecilie Kicks, NP  11/23/2016 4:24 PM    [address], [place], [place], [place] Phone: [phone]; Fax: [phone]

## 2016-11-23 NOTE — Patient Instructions (Signed)
Medication Instructions: STOP Eliquis  Start Aspirin 81 mg daily.   Labwork: Your physician recommends that you return for lab work: CBC, BMET, PT-INR   Testing/Procedures: Your physician has requested that you have a cardiac catheterization. Cardiac catheterization is used to diagnose and/or treat various heart conditions. Doctors may recommend this procedure for a number of different reasons. The most common reason is to evaluate chest pain. Chest pain can be a symptom of coronary artery disease (CAD), and cardiac catheterization can show whether plaque is narrowing or blocking your heart's arteries. This procedure is also used to evaluate the valves, as well as measure the blood flow and oxygen levels in different parts of your heart. For further information please visit [email or web address].   Following your catheterization, you will not be allowed to drive for 3 days.  No lifting, pushing, or pulling greater that 10 pounds is allowed for 1 week.  You will be required to have the following tests prior to the procedure:  1. Blood work-the blood work can be done no more than 14 days prior to the procedure.  It can be done at any [facility] lab.  There is one downstairs on the first floor of this building and one in the [facility] building [phone]).  2. Chest Xray-the chest xray order has already been placed at the [facility].

## 2016-11-24 LAB — PROTIME-INR
INR: 1.1
Prothrombin Time: 11.3 s (ref 9.0–11.5)

## 2016-11-25 ENCOUNTER — Ambulatory Visit: Payer: Medicare HMO | Admitting: Family Medicine

## 2016-11-25 ENCOUNTER — Encounter (HOSPITAL_COMMUNITY)
Admission: RE | Disposition: A | Discharge status: Home / Self Care | Payer: Self-pay | Source: Ambulatory Visit | Attending: Cardiovascular Disease

## 2016-11-25 ENCOUNTER — Ambulatory Visit (HOSPITAL_COMMUNITY)
Admission: RE | Admit: 2016-11-25 | Discharge: 2016-11-25 | Disposition: A | Discharge status: Home / Self Care | Payer: Medicare HMO | Source: Ambulatory Visit | Attending: Cardiovascular Disease | Admitting: Cardiovascular Disease

## 2016-11-25 ENCOUNTER — Ambulatory Visit (HOSPITAL_COMMUNITY): Payer: Medicare HMO

## 2016-11-25 DIAGNOSIS — Z7982 Long term (current) use of aspirin: Secondary | ICD-10-CM | POA: Diagnosis not present

## 2016-11-25 DIAGNOSIS — I5032 Chronic diastolic (congestive) heart failure: Secondary | ICD-10-CM | POA: Diagnosis not present

## 2016-11-25 DIAGNOSIS — F419 Anxiety disorder, unspecified: Secondary | ICD-10-CM | POA: Insufficient documentation

## 2016-11-25 DIAGNOSIS — G4700 Insomnia, unspecified: Secondary | ICD-10-CM | POA: Diagnosis not present

## 2016-11-25 DIAGNOSIS — F329 Major depressive disorder, single episode, unspecified: Secondary | ICD-10-CM | POA: Diagnosis not present

## 2016-11-25 DIAGNOSIS — Z96653 Presence of artificial knee joint, bilateral: Secondary | ICD-10-CM | POA: Diagnosis not present

## 2016-11-25 DIAGNOSIS — I482 Chronic atrial fibrillation: Secondary | ICD-10-CM | POA: Diagnosis not present

## 2016-11-25 DIAGNOSIS — Z79899 Other long term (current) drug therapy: Secondary | ICD-10-CM | POA: Insufficient documentation

## 2016-11-25 DIAGNOSIS — E785 Hyperlipidemia, unspecified: Secondary | ICD-10-CM | POA: Insufficient documentation

## 2016-11-25 DIAGNOSIS — I110 Hypertensive heart disease with heart failure: Secondary | ICD-10-CM | POA: Insufficient documentation

## 2016-11-25 DIAGNOSIS — Z981 Arthrodesis status: Secondary | ICD-10-CM | POA: Diagnosis not present

## 2016-11-25 DIAGNOSIS — K219 Gastro-esophageal reflux disease without esophagitis: Secondary | ICD-10-CM | POA: Diagnosis not present

## 2016-11-25 DIAGNOSIS — I2510 Atherosclerotic heart disease of native coronary artery without angina pectoris: Secondary | ICD-10-CM | POA: Insufficient documentation

## 2016-11-25 DIAGNOSIS — R931 Abnormal findings on diagnostic imaging of heart and coronary circulation: Secondary | ICD-10-CM | POA: Insufficient documentation

## 2016-11-25 DIAGNOSIS — Z8249 Family history of ischemic heart disease and other diseases of the circulatory system: Secondary | ICD-10-CM | POA: Diagnosis not present

## 2016-11-25 DIAGNOSIS — Z8673 Personal history of transient ischemic attack (TIA), and cerebral infarction without residual deficits: Secondary | ICD-10-CM | POA: Insufficient documentation

## 2016-11-25 DIAGNOSIS — Z955 Presence of coronary angioplasty implant and graft: Secondary | ICD-10-CM | POA: Diagnosis not present

## 2016-11-25 DIAGNOSIS — G4733 Obstructive sleep apnea (adult) (pediatric): Secondary | ICD-10-CM | POA: Diagnosis not present

## 2016-11-25 DIAGNOSIS — R918 Other nonspecific abnormal finding of lung field: Secondary | ICD-10-CM

## 2016-11-25 DIAGNOSIS — Z79891 Long term (current) use of opiate analgesic: Secondary | ICD-10-CM | POA: Insufficient documentation

## 2016-11-25 HISTORY — PX: CARDIAC CATHETERIZATION: SHX172

## 2016-11-25 SURGERY — LEFT HEART CATH AND CORONARY ANGIOGRAPHY

## 2016-11-25 MED ORDER — SODIUM CHLORIDE 0.9 % IV SOLN: 250.0000 mL | INTRAVENOUS | Status: DC | PRN

## 2016-11-25 MED ORDER — ASPIRIN 81 MG PO CHEW: 81.0000 mg | CHEWABLE_TABLET | ORAL | Status: DC

## 2016-11-25 MED ORDER — VERAPAMIL HCL 2.5 MG/ML IV SOLN
INTRAVENOUS | Status: AC
  Filled 2016-11-25: qty 2

## 2016-11-25 MED ORDER — SODIUM CHLORIDE 0.9% FLUSH: 3.0000 mL | Freq: Two times a day (BID) | INTRAVENOUS | Status: DC

## 2016-11-25 MED ORDER — LIDOCAINE HCL (PF) 1 % IJ SOLN
INTRAMUSCULAR | Status: AC
  Filled 2016-11-25: qty 30

## 2016-11-25 MED ORDER — MIDAZOLAM HCL 2 MG/2ML IJ SOLN
INTRAMUSCULAR | Status: AC
  Filled 2016-11-25: qty 2

## 2016-11-25 MED ORDER — FENTANYL CITRATE (PF) 100 MCG/2ML IJ SOLN
INTRAMUSCULAR | Status: AC
  Filled 2016-11-25: qty 2

## 2016-11-25 MED ORDER — HEPARIN SODIUM (PORCINE) 1000 UNIT/ML IJ SOLN
INTRAMUSCULAR | Status: AC
  Filled 2016-11-25: qty 1

## 2016-11-25 MED ORDER — MIDAZOLAM HCL 2 MG/2ML IJ SOLN
INTRAMUSCULAR | Status: DC | PRN
  Administered 2016-11-25: 1 mg via INTRAVENOUS
  Administered 2016-11-25: 2 mg via INTRAVENOUS

## 2016-11-25 MED ORDER — HEPARIN (PORCINE) IN NACL 2-0.9 UNIT/ML-% IJ SOLN
INTRAMUSCULAR | Status: AC
  Filled 2016-11-25: qty 1000

## 2016-11-25 MED ORDER — LIDOCAINE HCL (PF) 1 % IJ SOLN
INTRAMUSCULAR | Status: DC | PRN
  Administered 2016-11-25: 2 mL

## 2016-11-25 MED ORDER — SODIUM CHLORIDE 0.9 % WEIGHT BASED INFUSION: 1.0000 mL/kg/h | INTRAVENOUS | Status: DC

## 2016-11-25 MED ORDER — HEPARIN SODIUM (PORCINE) 1000 UNIT/ML IJ SOLN
INTRAMUSCULAR | Status: DC | PRN
  Administered 2016-11-25: 5000 [IU] via INTRAVENOUS

## 2016-11-25 MED ORDER — SODIUM CHLORIDE 0.9% FLUSH: 3.0000 mL | INTRAVENOUS | Status: DC | PRN

## 2016-11-25 MED ORDER — HEPARIN (PORCINE) IN NACL 2-0.9 UNIT/ML-% IJ SOLN
INTRAMUSCULAR | Status: DC | PRN
  Administered 2016-11-25: 1000 mL

## 2016-11-25 MED ORDER — IOPAMIDOL (ISOVUE-370) INJECTION 76%
INTRAVENOUS | Status: DC | PRN
  Administered 2016-11-25: 75 mL via INTRAVENOUS

## 2016-11-25 MED ORDER — SODIUM CHLORIDE 0.9 % WEIGHT BASED INFUSION
3.0000 mL/kg/h | INTRAVENOUS | Status: DC
  Administered 2016-11-25: 3 mL/kg/h via INTRAVENOUS

## 2016-11-25 MED ORDER — SODIUM CHLORIDE 0.9 % IV SOLN: INTRAVENOUS | Status: DC

## 2016-11-25 MED ORDER — IOPAMIDOL (ISOVUE-370) INJECTION 76%
INTRAVENOUS | Status: AC
  Filled 2016-11-25: qty 100

## 2016-11-25 MED ORDER — FENTANYL CITRATE (PF) 100 MCG/2ML IJ SOLN
INTRAMUSCULAR | Status: DC | PRN
  Administered 2016-11-25: 25 ug via INTRAVENOUS

## 2016-11-25 MED ORDER — ONDANSETRON HCL 4 MG/2ML IJ SOLN: 4.0000 mg | Freq: Four times a day (QID) | INTRAMUSCULAR | Status: DC | PRN

## 2016-11-25 SURGICAL SUPPLY — 9 items
CATH 5FR JL3.5 JR4 ANG PIG MP (CATHETERS) ×2 IMPLANT
DEVICE RAD COMP TR BAND LRG (VASCULAR PRODUCTS) ×2 IMPLANT
GLIDESHEATH SLEND SS 6F .021 (SHEATH) ×2 IMPLANT
GUIDEWIRE INQWIRE 1.5J.035X260 (WIRE) ×1 IMPLANT
INQWIRE 1.5J .035X260CM (WIRE) ×2
KIT HEART LEFT (KITS) ×2 IMPLANT
PACK CARDIAC CATHETERIZATION (CUSTOM PROCEDURE TRAY) ×2 IMPLANT
TRANSDUCER W/STOPCOCK (MISCELLANEOUS) ×2 IMPLANT
TUBING CIL FLEX 10 FLL-RA (TUBING) ×2 IMPLANT

## 2016-11-25 NOTE — H&P (View-Only) (Signed)
Cardiology Office Note   Date:  11/23/2016   ID:  [name] [name], DOB 1941-06-22, MRN [id number]  PCP:  Redge Gainer, MD  Cardiologist:  Dr. Burt Knack was Dr. Aundra Dubin    Chief Complaint  Patient presents with  . Shortness of Breath    results of nuc      History of Present Illness: [name] [name] is a 75 y.o. female who presents for results of nuc study and eval of her symptoms.   She has a hx of CAD, diastolic HF, HTN, HL, PAF. She has had multiple back surgeries and is on chronic narcotic pain medication. She has a hx of orthostatic hypotension and multiple falls as well as confusion. She had previously been kept off of anticoagulation. She had chest pain in 12/15 and Myoview was abnormal. LHC demonstrated complex bifurcational distal LM/ostial LCx disease. She was felt to be a poor CABG candidate due to difficulty with rehab. She underwent bifurcational PCI with DES to the distal LM and ostial LCx.   Seen in 1/17 and she was noted to be in AF with RVR when she presented to [facility] for pre-op workup for upcoming colonoscopy. We put her on Eliquis 5 bid and Toprol. Echo 01/29/16 demonstrated normal LVF, mod LAE, mild MR and PASP 37 mmHg. TSH was normal. She underwent DCCV in 2/17. But, she had recurrent AF after DCCV.  She was seen in the AFib Clinic but was back in NSR. She has had return of AFib since. She has not felt any different in NSR vs AFib. Therefore, she has been tx with a rate control strategy.   Last visit with lower ext edema and increased SOB.  No chest pain but her SOB has increased since the previous stents, she can hardly walk room to room due to DOE.     When she saw Mr. Kathlen Mody her lasix was increased and her wt is down 4 lbs.  Her edema has improved though some swelling at ankles.  She is very anxious.  She is tearful because she is "scared of what we may want to dot".  She and her daughter discuss she is alo more anxious than she has been and after exertion  she has trembling hands and is anxious.  Her PCP has prescribed ativan but she does not take.    The nuc study is worrisome for restenosis in the LCX.  + ischemia in the basal inferolateral, mid inferior and mid inferolateral location.     Past Medical History:  Diagnosis Date  . Anemia   . Anxiety   . Arthritis    "knees, back, fingers, toes; joints" (01/07/2015)  . CAD (coronary artery disease)    a. Abnl nuc 11/2014. Cath 12/2014 - turned down for CABG. Ultimately s/p TTVP, rotational atherectomy, PTCA and stenting of the ostial LCx and left main into the LAD (crush technique), and IVUS of the LAD/Left main. // b. Myoview 11/17: EF 48, poor quality/significant artifact; inf-lateral, inferior ischemia; Intermediate Risk  . Chronic atrial fibrillation ([place])    a.  First noted post-op 9/15 spinal fusion. She had cardioversion, not on anticoagulation. Fall risk, unsteady. // failed DCCV // Holter 10/17: AFib, Avg HR 97, PVCs, no other arrhythmia  . Chronic back pain greater than 3 months duration    a. spinal stenosis. Spinal fusion with rods in 2/15 at [facility] spinal fusion 9/15.  Marland Kitchen Chronic diastolic CHF (congestive heart failure) ([place])   . Circadian rhythm sleep disorder   .  CTS (carpal tunnel syndrome)   . Depression   . Diarrhea   . Diverticulitis of colon   . Esophageal stricture   . Gastritis   . GERD (gastroesophageal reflux disease)   . Hiatal hernia   . History of blood transfusion    "most of them related to OR's"   . History of echocardiogram    a. Echo 2/17:  EF 50-55%, trivial AI, midl MR, mod LAE, PASP 37 mmHg  . HTN (hypertension)   . Hyperlipidemia   . IBS (irritable bowel syndrome)   . Insomnia   . Memory disorder 12/04/2014  . MGUS (monoclonal gammopathy of unknown significance) dx'd 11/2014   a. Neg BMB 11/2014.  . Multiple falls   . Obesity   . Obstructive sleep apnea    "have mask; don't wear it" (01/07/2015)  . Orthostasis   . PAT (paroxysmal  atrial tachycardia) ([place])   . Personal history of colonic polyps 10/25/2011 & 12/02/11   not retrieved Dr Lyla Son & tubular adenomas  . Pneumonia 03/2014  . Sinus bradycardia    a. Baseline HR 50s-60s.  . Stroke [facility]) early 2000's   "small"; denies residual on 01/07/2015)    Past Surgical History:  Procedure Laterality Date  . BACK SURGERY    . BONE MARROW BIOPSY  11/2014  . CARDIAC CATHETERIZATION  12/27/2014   Procedure: INTRAVASCULAR PRESSURE WIRE/FFR STUDY;  Surgeon: Larey Dresser, MD;  Location: [facility] CATH LAB;  Service: Cardiovascular;;  . CARDIOVERSION N/A 02/13/2016   Procedure: CARDIOVERSION;  Surgeon: Josue Hector, MD;  Location: [facility] ENDOSCOPY;  Service: Cardiovascular;  Laterality: N/A;  . CARPAL TUNNEL RELEASE Right 1980's  . CATARACT EXTRACTION Bilateral   . CATARACT EXTRACTION W/ INTRAOCULAR LENS  IMPLANT, BILATERAL  2000's  . CORONARY ANGIOPLASTY WITH STENT PLACEMENT  01/07/2015   "2"  . CORONARY STENT PLACEMENT    . DILATION AND CURETTAGE OF UTERUS    . ESOPHAGOGASTRODUODENOSCOPY (EGD) WITH ESOPHAGEAL DILATION  "several times"  . JOINT REPLACEMENT    . KNEE ARTHROSCOPY Left 1995  . LAPAROSCOPIC CHOLECYSTECTOMY  2003  . LEFT HEART CATHETERIZATION WITH CORONARY ANGIOGRAM N/A 12/27/2014   Procedure: LEFT HEART CATHETERIZATION WITH CORONARY ANGIOGRAM;  Surgeon: Larey Dresser, MD;  Location: [facility] CATH LAB;  Service: Cardiovascular;  Laterality: N/A;  . PERCUTANEOUS CORONARY ROTOBLATOR INTERVENTION (PCI-R)  01/07/2015  . PERCUTANEOUS CORONARY ROTOBLATOR INTERVENTION (PCI-R) N/A 01/07/2015   Procedure: PERCUTANEOUS CORONARY ROTOBLATOR INTERVENTION (PCI-R);  Surgeon: Blane Ohara, MD;  Location[facility] CATH LAB;  Service: Cardiovascular;  Laterality: N/A;  . POSTERIOR FUSION THORACIC SPINE  08/2015  . POSTERIOR LUMBAR FUSION  01/2015  . TOTAL KNEE ARTHROPLASTY Bilateral 1990's - 2000's     Current Outpatient Prescriptions  Medication Sig Dispense Refill  . Calcium  Citrate-Vitamin D (CALCIUM + D PO) Take 1 tablet by mouth daily.    . Cholecalciferol (VITAMIN D) 2000 units tablet Take 2,000 Units by mouth daily.    . colestipol (COLESTID) 1 g tablet Take 2 tablets (2 g total) by mouth 2 (two) times daily. 360 tablet 1  . diltiazem (CARDIZEM CD) 120 MG 24 hr capsule Take 1 capsule (120 mg total) by mouth 2 (two) times daily. 180 capsule 3  . diphenoxylate-atropine (LOMOTIL) 2.5-0.025 MG per tablet Take 1 tablet by mouth 4 (four) times daily as needed for diarrhea or loose stools.     . Eszopiclone 3 MG TABS Take 1 tablet (3 mg total) by mouth at bedtime. For  insomnia,take immediately before bedtime. 90 tablet 1  . fentaNYL (DURAGESIC - DOSED MCG/HR) 50 MCG/HR Place 1 patch (50 mcg total) onto the skin every 3 (three) days. 10 patch 0  . FLUoxetine (PROZAC) 40 MG capsule Take 1 capsule by mouth  daily for depression 90 capsule 3  . furosemide (LASIX) 40 MG tablet Take 1.5 tablets (60 mg total) by mouth daily. 135 tablet 3  . HYDROcodone-acetaminophen (NORCO) 10-325 MG per tablet Take 1 tablet by mouth every 4 (four) hours as needed for moderate pain.     Marland Kitchen LORazepam (ATIVAN) 0.5 MG tablet Take 1 tablet (0.5 mg total) by mouth 2 (two) times daily as needed for anxiety. 60 tablet 1  . meclizine (ANTIVERT) 25 MG tablet Take 1 tablet (25 mg total) by mouth 3 (three) times daily as needed for dizziness. 60 tablet 0  . methocarbamol (ROBAXIN) 500 MG tablet Take 500 mg by mouth 4 (four) times daily as needed for muscle spasms.    . Multiple Vitamin (MULTIVITAMIN) tablet Take 1 tablet by mouth daily. For supplement    . NARCAN 4 MG/0.1ML LIQD Place 1 spray into the nose as directed. For drug overdose    . nitroGLYCERIN (NITROSTAT) 0.4 MG SL tablet Place 1 tablet (0.4 mg total) under the tongue every 5 (five) minutes as needed for chest pain (up to 3 doses). 25 tablet 3  . pantoprazole (PROTONIX) 40 MG tablet Take 1 tablet (40 mg total) by mouth 2 (two) times daily. 180  tablet 3  . potassium chloride SA (K-DUR,KLOR-CON) 20 MEQ tablet Take 1 tablet (20 mEq total) by mouth daily. 90 tablet 3  . rosuvastatin (CRESTOR) 20 MG tablet Patient alternating taking 1 whole tablet (20 mg) by mouth once daily and 1/2 tablet (10 mg ) by mouth once daily the next day.     Marland Kitchen aspirin EC 81 MG tablet Take 1 tablet (81 mg total) by mouth daily. 90 tablet 3   No current facility-administered medications for this visit.     Allergies:   Cymbalta [duloxetine hcl]; Morphine and related; Oxycodone-acetaminophen; Oxycodone-acetaminophen; Percocet [oxycodone-acetaminophen]; Valium [diazepam]; Duloxetine; Morphine; Altace [ramipril]; Floxin [ofloxacin]; Hydromorphone; Lipitor [atorvastatin]; Lovaza [omega-3-acid ethyl esters]; Pravachol [pravastatin sodium]; Trilipix [choline fenofibrate]; Zetia [ezetimibe]; and Zocor [simvastatin]    Social History:  The patient  reports that she has never smoked. She has never used smokeless tobacco. She reports that she does not drink alcohol or use drugs.   Family History:  The patient's family history includes Colon cancer in her sister and sister; Coronary artery disease in her brother, father, and mother; Emphysema in her brother and sister; Peripheral vascular disease in her father; Sleep apnea in her son.    ROS:  General:no colds or fevers, + weight down 4 lbs. Skin:no rashes or ulcers HEENT:no blurred vision, no congestion CV:see HPI PUL:see HPI GI:no diarrhea constipation or melena, no indigestion GU:no hematuria, no dysuria MS:no joint pain, no claudication Neuro:no syncope, no lightheadedness Endo:no diabetes, no thyroid disease Psych + anxiety  Wt Readings from Last 3 Encounters:  11/23/16 200 lb (90.7 kg)  11/19/16 204 lb (92.5 kg)  11/10/16 204 lb (92.5 kg)     PHYSICAL EXAM: VS:  BP 120/76   Pulse 71   Ht _0  (1.702 m)   Wt 200 lb (90.7 kg)   BMI 31.32 kg/m  , BMI Body mass index is 31.32 kg/m. General:Pleasant  affect but tearful at times, NAD Skin:Warm and dry, brisk capillary refill HEENT:normocephalic,  sclera clear, mucus membranes moist Neck:supple, no JVD, no bruits  Heart:irreg irreg without murmur, gallup, rub or click Lungs:clear without rales, rhonchi, or wheezes WFU:XNATF, soft, non tender, + BS, do not palpate liver spleen or masses Ext:no lower ext edema, 2+ pedal pulses, 2+ radial pulses Neuro:alert and oriented X 3, MAE, follows commands, + facial symmetry    EKG:  EKG is not ordered today. See previous EKG 11/10/16   Recent Labs: 06/22/2016: TSH 4.340 07/02/2016: HGB 13.9 10/27/2016: Platelets 220 11/17/2016: ALT 29; BNP 257.4; BUN 15; Creatinine, Ser 0.94; Potassium 4.8; Sodium 142    Lipid Panel    Component Value Date/Time   CHOL 146 10/27/2016 1539   CHOL 156 05/11/2013 1536   TRIG 154 (H) 10/27/2016 1539   TRIG 121 02/18/2016 1504   TRIG 239 (H) 05/11/2013 1536   HDL 64 10/27/2016 1539   HDL 57 02/18/2016 1504   HDL 43 05/11/2013 1536   CHOLHDL 2.3 10/27/2016 1539   LDLCALC 51 10/27/2016 1539   LDLCALC 50 08/21/2014 1141   LDLCALC 65 05/11/2013 1536       Other studies Reviewed: Additional studies/ records that were reviewed today include:   nuc study. 11/19/2016 Study Highlights     The left ventricular ejection fraction is mildly decreased (45-54%).  Nuclear stress EF: 48%.  There was no ST segment deviation noted during stress.  Findings consistent with ischemia.  This is an intermediate risk study.  Defect 1: There is a medium defect of moderate severity present in the basal inferolateral, mid inferior and mid inferolateral location.   This study if of poor quality with significant artifact.  However, there is possible medium size, mild severity reversible defect in the basal and mid inferolateral and mid inferior walls consistent with ischemia in the LCX territory (SDS 4).    Stress Findings from 2015  Lexiscan Cardiolite was done in  12/15 showing normal EF but areas of ischemia concerning for multivessel disease. She has episodes of chest soreness that occur daily.  Not related to exertion.  She also has pain in her back related to recent surgery.    Cath 12/27/14 Conclusions:  Hemodynamically significant distal left main/ostial circumflex stenosis. Borderline significant stenosis extending into the LAD.  Recommendations: Consideration of CABG versus PCI for revascularization. If PCI were chosen, I think the patient would require rotational atherectomy of the left main into the circumflex followed by bifurcation stenting. Would recommend surgical evaluation and if the patient is considered to be a poor candidate for surgery, PCI would be reasonable.  PCI Cath 12/2014   Conclusions:  Successful complex bifurcation stenting of the distal left main and ostial circumflex with adjunctive rotational atherectomy, guided by intravascular ultrasound  ASSESSMENT AND PLAN:  1. Increasing DOE is most likely her angina and now with + nuc study for inf. Lateral ischemia.  Dr. Burt Knack has reviewed and is concerned about LCX vessel.  Plan for cardiac cath.  She will begin ASA 81 mg daily.  And no more Eliquis. Until post procedure.  Plan for cath Wed. With Dr. Burt Knack.    Discussed risks which she is afraid she will not survive.    The patient understands that risks included but are not limited to stroke (1 in 1000), death (1 in 15), kidney failure [usually temporary] (1 in 500), bleeding (1 in 200), allergic reaction [possibly serious] (1 in 200).   2. Chronic diastolic CHF - She has noted increasing LE edema and worsening dyspnea on exertion.  Her BNP was recently mildly elevated.  She has had some improvement in her edema with taking extra Lasix for a few days.  She does eat a lot of salt.   limiting her salt to 2 gm Na a day had been discussed on previous visit.   She still has some evidence of volume excess on exam.   Lasix 60 mg QD.     3. Chronic AFib - s/p failedDCCV. Rate with fair control. Recent Creatinine and Hgb normal.              -  Continue Eliquis and Dilt to 120 bid  3. CAD - She denies chest pain.  But, she feels her shortness of breath is getting worse.  It has been 2 year since her PCI.  She is no longer on ASA or Plavix as she is on Eliquis.              -  see above.             -  Continue statin.   4. HTN - Controlled.   5. HL - LDL in 11/17 was 51.  Continue Crestor 20.  6. Dyspnea - Prob multifactorial and related to deconditioning, CAD, CHF.    7. Anxiety follow up with PCP.   Current medicines are reviewed with the patient today.  The patient Has no concerns regarding medicines.  The following changes have been made:  See above Labs/ tests ordered today include:see above  Disposition:   FU:  see above  Signed, Cecilie Kicks, NP  11/23/2016 4:24 PM    [address], [place], [place], [place] Phone: [phone]; Fax: [phone]

## 2016-11-25 NOTE — Interval H&P Note (Signed)
History and Physical Interval Note:  11/25/2016 11:05 AM  [name] [name]  has presented today for surgery, with the diagnosis of chf  The various methods of treatment have been discussed with the patient and family. After consideration of risks, benefits and other options for treatment, the patient has consented to  Procedure(s): Left Heart Cath and Coronary Angiography (N/A) as a surgical intervention .  The patient's history has been reviewed, patient examined, no change in status, stable for surgery.  I have reviewed the patient's chart and labs.  Questions were answered to the patient's satisfaction.     Sherren Mocha

## 2016-11-25 NOTE — Discharge Instructions (Signed)

## 2016-11-25 NOTE — Progress Notes (Signed)
Right wrist with bruising, slight swelling.  Erin PA notified.  Ace wrap to right wrist for 24 hours.  Will continue to monitor.

## 2016-11-26 ENCOUNTER — Telehealth: Payer: Self-pay | Admitting: Cardiovascular Disease

## 2016-11-26 ENCOUNTER — Encounter (HOSPITAL_COMMUNITY): Payer: Self-pay | Admitting: Cardiovascular Disease

## 2016-11-26 NOTE — Telephone Encounter (Signed)
New message  Pt's daughter is calling concerning results of CT scan done yesterday  Please call back and advise

## 2016-11-26 NOTE — Telephone Encounter (Signed)
Informed daughter that we are waiting on Dr. Burt Knack to review the CT scan and we will call with the results. Daughter verbalized understanding and appreciated the call.

## 2016-11-27 MED FILL — Verapamil HCl IV Soln 2.5 MG/ML: INTRAVENOUS | Qty: 2 | Status: AC

## 2016-11-27 NOTE — Telephone Encounter (Signed)
Notes Recorded by Sherren Mocha, MD on 11/27/2016 at 12:21 PM EST Reviewed results with patient's daughter, [name] on the telephone. Reassuring result.    Dr Burt Knack called pt's daughter with CT results. He advised that she keep planned follow-up with Richardson Dopp PA-C to reassess symptoms.

## 2016-12-02 ENCOUNTER — Other Ambulatory Visit: Payer: Self-pay

## 2016-12-02 DIAGNOSIS — M25431 Effusion, right wrist: Secondary | ICD-10-CM

## 2016-12-02 DIAGNOSIS — Z1231 Encounter for screening mammogram for malignant neoplasm of breast: Secondary | ICD-10-CM | POA: Diagnosis not present

## 2016-12-02 DIAGNOSIS — T148XXA Other injury of unspecified body region, initial encounter: Secondary | ICD-10-CM

## 2016-12-02 NOTE — Progress Notes (Signed)
Late Entry 2:15 PM  The pt and daughter came into the office to have someone check her right wrist site due to knot.  The pt has a scheduled mammogram on 2nd floor at 2:45. Assessed wrist site and palpable radial pulse, hand warm to touch with normal movement, slight puffiness at wrist site and nickel size hematoma at site which is new per the patient and sore.  The pt also has healing bruise on arm.  Discussed with Dr Burt Knack and order given for pt to proceed with duplex of site.  I contacted [facility] and they are unable to add pt on today. Appointment arranged for duplex tomorrow morning at 10:00. Pt's daughter aware.

## 2016-12-03 ENCOUNTER — Ambulatory Visit (HOSPITAL_COMMUNITY)
Admission: RE | Admit: 2016-12-03 | Discharge: 2016-12-03 | Disposition: A | Discharge status: Home / Self Care | Payer: Medicare HMO | Source: Ambulatory Visit | Attending: Cardiology | Admitting: Cardiology

## 2016-12-03 DIAGNOSIS — T148XXA Other injury of unspecified body region, initial encounter: Secondary | ICD-10-CM | POA: Diagnosis not present

## 2016-12-03 DIAGNOSIS — X58XXXA Exposure to other specified factors, initial encounter: Secondary | ICD-10-CM | POA: Insufficient documentation

## 2016-12-03 DIAGNOSIS — M25431 Effusion, right wrist: Secondary | ICD-10-CM

## 2016-12-07 ENCOUNTER — Ambulatory Visit: Payer: Medicare HMO | Admitting: Physician Assistant

## 2016-12-08 ENCOUNTER — Telehealth: Payer: Self-pay | Admitting: Hematology

## 2016-12-08 NOTE — Telephone Encounter (Signed)
Mailed records to [facility]/ risk adjustment °

## 2016-12-09 ENCOUNTER — Ambulatory Visit: Payer: Medicare HMO | Admitting: Family Medicine

## 2016-12-25 ENCOUNTER — Ambulatory Visit: Payer: Medicare HMO | Admitting: Physician Assistant

## 2016-12-30 ENCOUNTER — Telehealth: Payer: Self-pay | Admitting: Family Medicine

## 2016-12-30 ENCOUNTER — Ambulatory Visit: Payer: Medicare HMO | Admitting: Family Medicine

## 2016-12-30 NOTE — Telephone Encounter (Signed)
appt rescheduled w/[facility] Pt notified

## 2016-12-31 ENCOUNTER — Ambulatory Visit: Payer: Medicare HMO | Admitting: Hematology

## 2016-12-31 ENCOUNTER — Other Ambulatory Visit: Payer: Medicare HMO

## 2016-12-31 DIAGNOSIS — I10 Essential (primary) hypertension: Secondary | ICD-10-CM | POA: Diagnosis not present

## 2016-12-31 DIAGNOSIS — H40003 Preglaucoma, unspecified, bilateral: Secondary | ICD-10-CM | POA: Diagnosis not present

## 2016-12-31 DIAGNOSIS — H00029 Hordeolum internum unspecified eye, unspecified eyelid: Secondary | ICD-10-CM | POA: Diagnosis not present

## 2016-12-31 DIAGNOSIS — R231 Pallor: Secondary | ICD-10-CM | POA: Diagnosis not present

## 2017-01-01 ENCOUNTER — Other Ambulatory Visit (HOSPITAL_BASED_OUTPATIENT_CLINIC_OR_DEPARTMENT_OTHER): Payer: Medicare HMO

## 2017-01-01 ENCOUNTER — Encounter: Payer: Self-pay | Admitting: Hematology

## 2017-01-01 ENCOUNTER — Telehealth: Payer: Self-pay | Admitting: Hematology

## 2017-01-01 ENCOUNTER — Ambulatory Visit (HOSPITAL_BASED_OUTPATIENT_CLINIC_OR_DEPARTMENT_OTHER): Payer: Medicare HMO | Admitting: Hematology

## 2017-01-01 VITALS — BP 129/61 | HR 87 | Temp 98.9°F | Resp 18 | Ht 67.0 in | Wt 200.5 lb

## 2017-01-01 DIAGNOSIS — I4891 Unspecified atrial fibrillation: Secondary | ICD-10-CM

## 2017-01-01 DIAGNOSIS — I10 Essential (primary) hypertension: Secondary | ICD-10-CM

## 2017-01-01 DIAGNOSIS — D509 Iron deficiency anemia, unspecified: Secondary | ICD-10-CM | POA: Diagnosis not present

## 2017-01-01 DIAGNOSIS — D472 Monoclonal gammopathy: Secondary | ICD-10-CM

## 2017-01-01 LAB — CBC WITH DIFFERENTIAL/PLATELET
BASO%: 0.6 % (ref 0.0–2.0)
BASOS ABS: 0 10*3/uL (ref 0.0–0.1)
EOS%: 2.3 % (ref 0.0–7.0)
Eosinophils Absolute: 0.1 10*3/uL (ref 0.0–0.5)
HCT: 40.6 % (ref 34.8–46.6)
HGB: 13.4 g/dL (ref 11.6–15.9)
LYMPH%: 25.2 % (ref 14.0–49.7)
MCH: 31.1 pg (ref 25.1–34.0)
MCHC: 33 g/dL (ref 31.5–36.0)
MCV: 94.3 fL (ref 79.5–101.0)
MONO#: 0.3 10*3/uL (ref 0.1–0.9)
MONO%: 6.8 % (ref 0.0–14.0)
NEUT#: 3.3 10*3/uL (ref 1.5–6.5)
NEUT%: 65.1 % (ref 38.4–76.8)
Platelets: 187 10*3/uL (ref 145–400)
RBC: 4.31 10*6/uL (ref 3.70–5.45)
RDW: 13.6 % (ref 11.2–14.5)
WBC: 5 10*3/uL (ref 3.9–10.3)
lymph#: 1.3 10*3/uL (ref 0.9–3.3)

## 2017-01-01 LAB — COMPREHENSIVE METABOLIC PANEL
ALT: 39 U/L (ref 0–55)
AST: 43 U/L — AB (ref 5–34)
Albumin: 3.5 g/dL (ref 3.5–5.0)
Alkaline Phosphatase: 140 U/L (ref 40–150)
Anion Gap: 8 mEq/L (ref 3–11)
BUN: 12.1 mg/dL (ref 7.0–26.0)
CHLORIDE: 102 meq/L (ref 98–109)
CO2: 32 meq/L — AB (ref 22–29)
Calcium: 9.6 mg/dL (ref 8.4–10.4)
Creatinine: 0.9 mg/dL (ref 0.6–1.1)
EGFR: 62 mL/min/{1.73_m2} — AB (ref >90)
GLUCOSE: 156 mg/dL — AB (ref 70–140)
POTASSIUM: 4.1 meq/L (ref 3.5–5.1)
SODIUM: 141 meq/L (ref 136–145)
Total Bilirubin: 0.56 mg/dL (ref 0.20–1.20)
Total Protein: 6.4 g/dL (ref 6.4–8.3)

## 2017-01-01 LAB — IRON AND TIBC
%SAT: 32 % (ref 21–57)
Iron: 89 ug/dL (ref 41–142)
TIBC: 273 ug/dL (ref 236–444)
UIBC: 184 ug/dL (ref 120–384)

## 2017-01-01 LAB — FERRITIN: Ferritin: 157 ng/ml (ref 9–269)

## 2017-01-01 NOTE — Progress Notes (Signed)
[name] [name] FOLLOW UP NOTE  Patient Care Team: [name], MD as PCP - General Suella Broad, MD (Physical Medicine and Rehabilitation) Hillary Bow, MD (Cardiology) Newton Pigg, MD (Obstetrics and Gynecology)  CHIEF COMPLAINTS Follow up anemia and MGUS   INITIAL PRESENTATION (first consult):  [name] [name] 76 y.o. female is being referred by Dr. Deatra Ina because of anemia.   She had lumbar and thoracic spine fusion surgery for kyphosis in Feb 2015 and Sep 2015, and required blood transfusion 2u each time after surgery. She was discharged to rehab after surgery, and she received 2u RBC in Nov 4th, 2015 for Hb 7.8g/dl. She was fatigued when her Hg was low, but still able to do some rehab. She had multiple full in the past few month and was hospitalized last month. She still has diffuse body pain from the recent fall, quite fatigued, is only able to walk a short distance, (+) dyspnea and chest heaviness on exertion. No nausea, no change of her bowel habits. Her appetite is moderate to low, she lost about 15-20lbs in the past year.  She denied any bleeding episodes including hematochezia, melana, hemoptysis, hematuria or epitaxis. No mucosal bleeding or easy bruising. She was finally discharged home on 10/30/14, still getting home PT/OT. She came in today with a wheelchair.   Per her daughter, she has had intermittent confusion and anxiety lately, she seems to have some cognitive function dificiency also. Pt her self contributes to some of her medications.   She has last colonoscopy about 3 years ago, she probably had polyps removed. Her last mammogram was one year ago which was normal. Per her daughter, she has a normal CT abdomen and pelvis about 5-6 month ago which was ordered for some abdominal symptoms. She has been on iron pill (1 daily) a few weeks ago.    CURRENT THERAPY: Observation   INTERVAL HISTORY  [name] [name] returns for follow up. She is accompanied by her  daughter to clinic today. She has multiple complains, including chronic back and leg pain, she is not very physically active, uses a cane or walker. She also complains of fatigue, does not have the energy to do things she used to do. Daughter also reports memory loss. Her symptoms are chronic, has not gotten worse lately.  MEDICAL HISTORY:  Past Medical History  Diagnosis Date  . Obesity   . Atrial fibrillation    . CTS (carpal tunnel syndrome)   . Diarrhea   . IBS (irritable bowel syndrome)   . Diverticulitis of colon   . Gastritis   . Esophageal stricture, s/p dilation    . Personal history of colonic polyps 10/25/2011 & 12/02/11    not retrieved Dr Lyla Son & tubular adenomas  . Hiatal hernia   . GERD (gastroesophageal reflux disease)   . Obstructive sleep apnea   . Insomnia   . HTN (hypertension)   . Hyperlipidemia   . Depression/anxiety    . DIASTOLIC HEART FAILURE, CHRONIC 04/04/2009    Qualifier: Diagnosis of  By: Mare Ferrari, RMA, Sherri      SURGICAL HISTORY: Past Surgical History:  Procedure Laterality Date  . BACK SURGERY    . BONE MARROW BIOPSY  11/2014  . CARDIAC CATHETERIZATION  12/27/2014   Procedure: INTRAVASCULAR PRESSURE WIRE/FFR STUDY;  Surgeon: Larey Dresser, MD;  Location: [facility] CATH LAB;  Service: Cardiovascular;;  . CARDIAC CATHETERIZATION N/A 11/25/2016   Procedure: Left Heart Cath and Coronary Angiography;  Surgeon: Legrand Como  Burt Knack, MD;  Location: [facility] CV LAB;  Service: Cardiovascular;  Laterality: N/A;  . CARDIOVERSION N/A 02/13/2016   Procedure: CARDIOVERSION;  Surgeon: Josue Hector, MD;  Location: [facility] ENDOSCOPY;  Service: Cardiovascular;  Laterality: N/A;  . CARPAL TUNNEL RELEASE Right 1980's  . CATARACT EXTRACTION Bilateral   . CATARACT EXTRACTION W/ INTRAOCULAR LENS  IMPLANT, BILATERAL  2000's  . CORONARY ANGIOPLASTY WITH STENT PLACEMENT  01/07/2015   "2"  . CORONARY STENT PLACEMENT    . DILATION AND CURETTAGE OF UTERUS    .  ESOPHAGOGASTRODUODENOSCOPY (EGD) WITH ESOPHAGEAL DILATION  "several times"  . JOINT REPLACEMENT    . KNEE ARTHROSCOPY Left 1995  . LAPAROSCOPIC CHOLECYSTECTOMY  2003  . LEFT HEART CATHETERIZATION WITH CORONARY ANGIOGRAM N/A 12/27/2014   Procedure: LEFT HEART CATHETERIZATION WITH CORONARY ANGIOGRAM;  Surgeon: Larey Dresser, MD;  Location: [facility] CATH LAB;  Service: Cardiovascular;  Laterality: N/A;  . PERCUTANEOUS CORONARY ROTOBLATOR INTERVENTION (PCI-R)  01/07/2015  . PERCUTANEOUS CORONARY ROTOBLATOR INTERVENTION (PCI-R) N/A 01/07/2015   Procedure: PERCUTANEOUS CORONARY ROTOBLATOR INTERVENTION (PCI-R);  Surgeon: Blane Ohara, MD;  Location: [facility] CATH LAB;  Service: Cardiovascular;  Laterality: N/A;  . POSTERIOR FUSION THORACIC SPINE  08/2015  . POSTERIOR LUMBAR FUSION  01/2015  . TOTAL KNEE ARTHROPLASTY Bilateral 1990's - 2000's    SOCIAL HISTORY: Social History   Social History  . Marital status: Widowed    Spouse name: N/A  . Number of children: 2  . Years of education: HS   Occupational History  . librarian- retired     retired   Social History Main Topics  . Smoking status: Never Smoker  . Smokeless tobacco: Never Used  . Alcohol use No     Comment: 01/07/2015 "glass of wine at Christmas, maybe"  . Drug use: No  . Sexual activity: No   Other Topics Concern  . Not on file   Social History Narrative   Patient is right handed   Patient drinks 1-2 sodas daily.   patlient lives alone.       FAMILY HISTORY: Family History  Problem Relation Age of Onset  . Coronary artery disease Father   . Peripheral vascular disease Father   . Coronary artery disease Mother   . Coronary artery disease Brother   . Colon cancer Sister at 8   . Emphysema Sister   . Sleep apnea Son   . Colon cancer Sister at 41     spread to her brain  No family history of blood disorders or other malignancy   ALLERGIES:  is allergic to cymbalta [duloxetine hcl]; morphine and related;  oxycodone-acetaminophen; valium [diazepam]; statins; altace [ramipril]; floxin [ofloxacin]; hydromorphone; lovaza [omega-3-acid ethyl esters]; trilipix [choline fenofibrate]; and zetia [ezetimibe].  MEDICATIONS:  Current Outpatient Prescriptions  Medication Sig Dispense Refill  . aspirin EC 81 MG tablet Take 1 tablet (81 mg total) by mouth daily. 90 tablet 3  . Calcium Citrate-Vitamin D (CALCIUM + D PO) Take 1 tablet by mouth daily.    . Cholecalciferol (VITAMIN D) 2000 units tablet Take 2,000 Units by mouth daily.    . clobetasol cream (TEMOVATE) 9.41 % Apply 1 application topically See admin instructions. Applies to vaginal area twice a week.    . colestipol (COLESTID) 1 g tablet Take 2 tablets (2 g total) by mouth 2 (two) times daily. (Patient taking differently: Take 1 g by mouth 2 (two) times daily. ) 360 tablet 1  . diltiazem (CARDIZEM CD) 120 MG 24 hr  capsule Take 1 capsule (120 mg total) by mouth 2 (two) times daily. 180 capsule 3  . diphenoxylate-atropine (LOMOTIL) 2.5-0.025 MG per tablet Take 1 tablet by mouth 4 (four) times daily as needed for diarrhea or loose stools.     Marland Kitchen ELIQUIS 5 MG TABS tablet Take 5 mg by mouth 2 (two) times daily.    . Eszopiclone 3 MG TABS Take 1 tablet (3 mg total) by mouth at bedtime. For insomnia,take immediately before bedtime. 90 tablet 1  . fentaNYL (DURAGESIC - DOSED MCG/HR) 50 MCG/HR Place 1 patch (50 mcg total) onto the skin every 3 (three) days. 10 patch 0  . FLUoxetine (PROZAC) 40 MG capsule Take 1 capsule by mouth  daily for depression (Patient taking differently: Take 40 mg by mouth daily. Take 1 capsule by mouth  daily for depression) 90 capsule 3  . furosemide (LASIX) 40 MG tablet Take 1.5 tablets (60 mg total) by mouth daily. 135 tablet 3  . HYDROcodone-acetaminophen (NORCO) 10-325 MG per tablet Take 1 tablet by mouth every 4 (four) hours as needed for moderate pain.     Marland Kitchen LORazepam (ATIVAN) 0.5 MG tablet Take 1 tablet (0.5 mg total) by mouth 2  (two) times daily as needed for anxiety. 60 tablet 1  . meclizine (ANTIVERT) 25 MG tablet Take 1 tablet (25 mg total) by mouth 3 (three) times daily as needed for dizziness. 60 tablet 0  . methocarbamol (ROBAXIN) 500 MG tablet Take 500 mg by mouth 4 (four) times daily as needed for muscle spasms.    . Multiple Vitamin (MULTIVITAMIN) tablet Take 1 tablet by mouth daily. For supplement    . nitroGLYCERIN (NITROSTAT) 0.4 MG SL tablet Place 1 tablet (0.4 mg total) under the tongue every 5 (five) minutes as needed for chest pain (up to 3 doses). 25 tablet 3  . pantoprazole (PROTONIX) 40 MG tablet Take 1 tablet (40 mg total) by mouth 2 (two) times daily. 180 tablet 3  . potassium chloride SA (K-DUR,KLOR-CON) 20 MEQ tablet Take 1 tablet (20 mEq total) by mouth daily. 90 tablet 3  . rosuvastatin (CRESTOR) 20 MG tablet Take 10-20 mg by mouth daily. Patient alternating taking 1 whole tablet (20 mg) by mouth once daily and 1/2 tablet (10 mg ) by mouth once daily the next day.      No current facility-administered medications for this visit.     REVIEW OF SYSTEMS:   Constitutional: Denies fevers, chills or abnormal night sweats Eyes: Denies blurriness of vision, double vision or watery eyes Ears, nose, mouth, throat, and face: Denies mucositis or sore throat Respiratory: Denies cough, dyspnea or wheezes Cardiovascular: Denies palpitation, chest discomfort or lower extremity swelling Gastrointestinal:  Denies nausea, heartburn or change in bowel habits Skin: Denies abnormal skin rashes Lymphatics: Denies new lymphadenopathy or easy bruising Neurological:Denies numbness, tingling or new weaknesses, (+) intermittent confusion, (+) chronic back pain  Behavioral/Psych: (+) anxiety, no new changes  All other systems were reviewed with the patient and are negative.  PHYSICAL EXAMINATION:  Vitals:   01/01/17 1456  BP: 129/61  Pulse: 87  Resp: 18  Temp: 98.9 F (37.2 C)   Filed Weights   01/01/17 1456   Weight: 200 lb 8 oz (90.9 kg)    GENERAL:alert, no distress and comfortable, sitting in wheelchair  SKIN: skin color, texture, turgor are normal, no rashes or significant lesions EYES: normal, conjunctiva are pink and non-injected, sclera clear OROPHARYNX:no exudate, no erythema and lips, buccal mucosa, and tongue  normal  NECK: supple, thyroid normal size, non-tender, without nodularity LYMPH:  no palpable lymphadenopathy in the cervical, axillary or inguinal LUNGS: clear to auscultation and percussion with normal breathing effort HEART: regular rate & rhythm and no murmurs and no lower extremity edema ABDOMEN:abdomen soft, non-tender and normal bowel sounds Musculoskeletal:no cyanosis of digits and no clubbing. (+) surgical scar at her thoracic and lumbar spine  PSYCH: alert & oriented x 3 with fluent speech, but argues with her daughter a lot  NEURO: no focal motor/sensory deficits  LABORATORY DATA:  CBC Latest Ref Rng & Units 01/01/2017 11/23/2016 10/27/2016  WBC 3.9 - 10.3 10e3/uL 5.0 6.0 6.7  Hemoglobin 11.6 - 15.9 g/dL 13.4 12.8 -  Hematocrit 34.8 - 46.6 % 40.6 39.7 39.6  Platelets 145 - 400 10e3/uL 187 195 220   CMP Latest Ref Rng & Units 01/01/2017 11/23/2016 11/17/2016  Glucose 70 - 140 mg/dl 156(H) 97 110(H)  BUN 7.0 - 26.0 mg/dL 12.1 13 15   Creatinine 0.6 - 1.1 mg/dL 0.9 0.84 0.94  Sodium 136 - 145 mEq/L 141 140 142  Potassium 3.5 - 5.1 mEq/L 4.1 4.9 4.8  Chloride 98 - 110 mmol/L - 103 98  CO2 22 - 29 mEq/L 32(H) 32(H) 29  Calcium 8.4 - 10.4 mg/dL 9.6 9.5 9.4  Total Protein 6.4 - 8.3 g/dL 6.4 - 6.6  Total Bilirubin 0.20 - 1.20 mg/dL 0.56 - 0.5  Alkaline Phos 40 - 150 U/L 140 - 142(H)  AST 5 - 34 U/L 43(H) - 26  ALT 0 - 55 U/L 39 - 29     SURGICAL PATH 12/07/14 Bone Marrow, Aspirate,Biopsy, and Clot, right post iliac - HYPERCELLULAR BONE MARROW FOR AGE WITH TRILINEAGE HEMATOPOIESIS AND 5% PLASMA CELLS. - SEE COMMENT. PERIPHERAL BLOOD: - NORMOCYTIC-NORMOCHROMIC  ANEMIA. Diagnosis Note The bone marrow is hypercellular for age with trilineage hematopoiesis and essentially orderly and progressive maturation of all myeloid cell types. The plasma cells represent 5% of all cells associated with occasional small clusters. Immunohistochemical stains apparently show polyclonal staining pattern in plasma cells for kappa and lambda light chains. The appearance is non specific and not diagnostic of plasma cell dyscrasia/neoplasm. Correlation  Cytogenetics: normal    RADIOGRAPHIC STUDIES: I have personally reviewed the radiological images as listed and agreed with the findings in the report. Ct Head Wo Contrast 10/27/2014   IMPRESSION: No acute intracranial abnormalities. Chronic atrophy and small vessel ischemic changes.     Ct Cervical Spine Wo Contrast 10/27/2014 IMPRESSION:  1. No evidence of acute fracture or malalignment.  2. Multilevel cervical spondylosis and facet arthropathy without focality.  3. Atherosclerotic vascular calcifications in both carotid arteries.      ASSESSMENT & PLAN:  [name] Pegues is a 76 y.o.  female with multiple comorbidities, who presents with moderate persistent anemia after her spinal fusion surgery which required blood transfusion 3 times.  1. Aanemia of iron deficiency -I previously reviewed her lab result with her and  Her daughter. No lab evidence of megaloblastic anemia, or hemolysis.   -She has normal iron level and ferritin, but low iron saturation and elevated soluble transferrin receptor, and history of very low ferritin in the past (<10), which support IDA. Her bone marrow biopsy also showed low iron staining. -Anemia has spontaneously resolved. Her CBC is normal today. Repeated ferritin and iron study today are normal. I suggest her continue to take by mouth niferex 1 tab daily.  -We'll continue monitor her CBC periodically.  2. MGUS, IgG  -SPEP  showed low level M-protein (0.3), immunofixation was consistent with IgG  lambda protein.  Quantitative immunoglobulin level and light chain levels were grossly normal. -I reviewed her bone marrow biopsy results, which showed 5% plasma cell, no monoclonal light chain deposition. -she has MGUS. No evidence of multiple myeloma. -Her CBC and BMP are normal, I think her chronic back and leg pain are most related to her arthritis, no clinical concern for disease progression to multiple myeloma -Her SPEP results is still pending from today, M protein was 0.3 6 months ago. I'll call her next week with the result.  3. Dementia, anxiety -She could not tolerate Aricept. Follow up with neurologist.  4. HTN, AF, dyslipidemia  -follow up with PCP and cardiologist  5. Right hip and thigh pain -I strongly encouraged her to follow up with her orthopedic surgeon  6. Fatigue -Probably related to her sedentary lifestyle, I strongly encouraged her to consider physical therapy   Plan -I'll call her next week for her lab results. -Return to clinic for follow-up in 6 months with lab  All questions were answered. The patient knows to call the clinic with any problems, questions or concerns.  I spent 15 minutes counseling the patient face to face. The total time spent in the appointment was 20 minutes and more than 50% was on counseling.     Truitt Merle, MD 01/01/2017

## 2017-01-01 NOTE — Telephone Encounter (Signed)
Appointments scheduled per 11/2 LOS. Patient given AVS report and calendars with future scheduled appointments °

## 2017-01-02 ENCOUNTER — Encounter: Payer: Self-pay | Admitting: Hematology

## 2017-01-04 ENCOUNTER — Ambulatory Visit (INDEPENDENT_AMBULATORY_CARE_PROVIDER_SITE_OTHER): Payer: Medicare HMO

## 2017-01-04 ENCOUNTER — Encounter: Payer: Self-pay | Admitting: Family Medicine

## 2017-01-04 ENCOUNTER — Ambulatory Visit (INDEPENDENT_AMBULATORY_CARE_PROVIDER_SITE_OTHER): Payer: Medicare HMO | Admitting: Family Medicine

## 2017-01-04 VITALS — BP 134/70 | HR 68 | Temp 98.2°F | Ht 67.0 in | Wt 199.0 lb

## 2017-01-04 DIAGNOSIS — E7800 Pure hypercholesterolemia, unspecified: Secondary | ICD-10-CM | POA: Diagnosis not present

## 2017-01-04 DIAGNOSIS — Z13820 Encounter for screening for osteoporosis: Secondary | ICD-10-CM

## 2017-01-04 DIAGNOSIS — I208 Other forms of angina pectoris: Secondary | ICD-10-CM

## 2017-01-04 DIAGNOSIS — Z780 Asymptomatic menopausal state: Secondary | ICD-10-CM

## 2017-01-04 DIAGNOSIS — M549 Dorsalgia, unspecified: Secondary | ICD-10-CM

## 2017-01-04 DIAGNOSIS — K219 Gastro-esophageal reflux disease without esophagitis: Secondary | ICD-10-CM | POA: Diagnosis not present

## 2017-01-04 DIAGNOSIS — I10 Essential (primary) hypertension: Secondary | ICD-10-CM | POA: Diagnosis not present

## 2017-01-04 DIAGNOSIS — E559 Vitamin D deficiency, unspecified: Secondary | ICD-10-CM

## 2017-01-04 DIAGNOSIS — I5032 Chronic diastolic (congestive) heart failure: Secondary | ICD-10-CM

## 2017-01-04 DIAGNOSIS — G8929 Other chronic pain: Secondary | ICD-10-CM

## 2017-01-04 DIAGNOSIS — Z981 Arthrodesis status: Secondary | ICD-10-CM

## 2017-01-04 DIAGNOSIS — I480 Paroxysmal atrial fibrillation: Secondary | ICD-10-CM

## 2017-01-04 DIAGNOSIS — I4819 Other persistent atrial fibrillation: Secondary | ICD-10-CM

## 2017-01-04 DIAGNOSIS — I481 Persistent atrial fibrillation: Secondary | ICD-10-CM

## 2017-01-04 LAB — KAPPA/LAMBDA LIGHT CHAINS
IG KAPPA FREE LIGHT CHAIN: 25.6 mg/L — AB (ref 3.3–19.4)
IG LAMBDA FREE LIGHT CHAIN: 25.1 mg/L (ref 5.7–26.3)
Kappa/Lambda FluidC Ratio: 1.02 (ref 0.26–1.65)

## 2017-01-04 NOTE — Patient Instructions (Addendum)
Medicare Annual Wellness Visit  [facility] and the medical providers at [facility] strive to bring you the best medical care.  In doing so we not only want to address your current medical conditions and concerns but also to detect new conditions early and prevent illness, disease and health-related problems.    Medicare offers a yearly Wellness Visit which allows our clinical staff to assess your need for preventative services including immunizations, lifestyle education, counseling to decrease risk of preventable diseases and screening for fall risk and other medical concerns.    This visit is provided free of charge (no copay) for all Medicare recipients. The clinical pharmacists at [facility] have begun to conduct these Wellness Visits which will also include a thorough review of all your medications.    As you primary medical provider recommend that you make an appointment for your Annual Wellness Visit if you have not done so already this year.  You may set up this appointment before you leave today or you may call back [phone]) and schedule an appointment.  Please make sure when you call that you mention that you are scheduling your Annual Wellness Visit with the clinical pharmacist so that the appointment may be made for the proper length of time.     Continue current medications. Continue good therapeutic lifestyle changes which include good diet and exercise. Fall precautions discussed with patient. If an FOBT was given today- please return it to our front desk. If you are over 67 years old - you may need Prevnar 69 or the adult Pneumonia vaccine.  **Flu shots are available--- please call and schedule a FLU-CLINIC appointment**  After your visit with [place] today you will receive a survey in the mail or online from [employer] regarding your care with [place]. Please take a moment to fill this out. Your feedback is very  important to [place] as you can help [place] better understand your patient needs as well as improve your experience and satisfaction. WE CARE ABOUT YOU!!!  Continue to follow-up with specialists as planned Don't forget to take her vitamin D every day When the flu season has diminished, please plan to check back into physical therapy next-door so that you can get your strength back and walk better without having some much pain----please do not forget to do this.

## 2017-01-04 NOTE — Progress Notes (Signed)
Subjective:    Patient ID: [name] [name], female    DOB: Mar 18, 1941, 76 y.o.   MRN: [id number]  HPI Pt here for follow up and management of chronic medical problems which includes a fib, hyperlipidemia, and hypertension. She is taking medications regularly.The patient has had blood work done around November 1. These were done in our office. She has since been seeing the cardiologist. 3 liver function tests were elevated at the time. We will repeat these today. Cholesterol numbers blood sugar kidney function and CBC were all within normal limits. The vitamin D level was at the low end of normal range and we'll make sure she still taking her vitamin D. A BNP was done and this was slightly elevated and she has seen the cardiologist since that time. Cholesterol numbers as mentioned were excellent. Patient appears to be in good spirits. She has an appointment with the cardiology group coming up this week. She also has an appointment with the neurosurgeons and [facility] regarding her back and has been almost a year since she has seen them. She denies any chest pain or shortness of breath. She denies any trouble with her stoma other than off and on loose bowel movements and constipation with irritable bowel syndrome. She denies any blood in the stool or black tarry bowel movements. She is passing her water well. She appears very calm today and is pleasant and smiling.   Patient Active Problem List   Diagnosis Date Noted  . Abnormal nuclear cardiac imaging test   . Chronic diastolic CHF (congestive heart failure) ([place]) 07/17/2015  . Anemia, iron deficiency 07/02/2015  . DDD (degenerative disc disease), lumbar 03/22/2015  . Fall 03/22/2015  . 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 Difficulty hearing 03/22/2015  . Adaptive colitis 03/22/2015  . Lichen [id number]  . Lumbar scoliosis 03/22/2015  . Fracture of pelvis ([place]) 03/22/2015  . Herpes zona 03/22/2015  . History of other specified conditions  presenting hazards to health 03/22/2015  . Anemia 01/08/2015  . MGUS (monoclonal gammopathy of unknown significance) 01/08/2015  . Ischemic chest pain ([place])   . Permanent atrial fibrillation ([place]) 12/22/2014  . CAD (coronary artery disease) 12/22/2014  . Memory disorder 12/04/2014  . Deficiency anemia 11/10/2014  . Vertigo 10/27/2014  . Angulation of spine 09/18/2014  . Metabolic syndrome [id number]  . HCAP (healthcare-associated pneumonia) 03/27/2014  . S/P spinal fusion 02/23/2014  . Chronic pain associated with significant psychosocial dysfunction 02/23/2014  . Preoperative evaluation of a medical condition to rule out surgical contraindications (TAR required) 10/09/2013  . Abdominal pain 07/06/2013  . Gastroesophageal hernia 07/06/2013  . Incontinence 10/13/2012  . History of IBS 01/26/2012  . 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 EDEMA 06/25/2010  . DEGENERATIVE JOINT DISEASE 04/08/2010  . OTHER DISORDERS OF NERVOUS SYSTEM&SENSE ORGANS 04/08/2010  . Obesity (BMI 30.0-34.9) 04/04/2009  . CIRCADIAN RHYTHM SLEEP D/O DELAY SLEEP PHSE TYPE 04/04/2009  . CARPAL TUNNEL SYNDROME 04/04/2009  . Diarrhea 01/17/2009  . IRRITABLE BOWEL SYNDROME 11/13/2008  . ESOPHAGEAL STRICTURE 11/12/2008  . GERD 11/12/2008  . DUODENITIS, WITHOUT HEMORRHAGE 11/12/2008  . Diaphragmatic hernia without mention of obstruction or gangrene 11/12/2008  . DIVERTICULOSIS, COLON 11/12/2008  . COLONIC POLYPS, HX OF 11/12/2008  . Obstructive sleep apnea 05/23/2008  . DYSPNEA 05/23/2008  . Hyperlipidemia 05/22/2008  . Depression 05/22/2008  . Essential hypertension 05/22/2008  . INSOMNIA 05/22/2008   Outpatient Encounter Prescriptions as of 01/04/2017  Medication Sig  . aspirin EC 81 MG tablet Take 1 tablet (81  mg total) by mouth daily.  . Calcium Citrate-Vitamin D (CALCIUM + D PO) Take 1 tablet by mouth daily.  . Cholecalciferol (VITAMIN D) 2000 units tablet Take 2,000 Units by mouth daily.  . clobetasol  cream (TEMOVATE) [id number] % Apply 1 application topically See admin instructions. Applies to vaginal area twice a week.  . colestipol (COLESTID) 1 g tablet Take 2 tablets (2 g total) by mouth 2 (two) times daily. (Patient taking differently: Take 1 g by mouth 2 (two) times daily. )  . diltiazem (CARDIZEM CD) 120 MG 24 hr capsule Take 1 capsule (120 mg total) by mouth 2 (two) times daily.  . diphenoxylate-atropine (LOMOTIL) 2.5-0.025 MG per tablet Take 1 tablet by mouth 4 (four) times daily as needed for diarrhea or loose stools.   Marland Kitchen ELIQUIS 5 MG TABS tablet Take 5 mg by mouth 2 (two) times daily.  . Eszopiclone 3 MG TABS Take 1 tablet (3 mg total) by mouth at bedtime. For insomnia,take immediately before bedtime.  . fentaNYL (DURAGESIC - DOSED MCG/HR) 50 MCG/HR Place 1 patch (50 mcg total) onto the skin every 3 (three) days.  Marland Kitchen FLUoxetine (PROZAC) 40 MG capsule Take 1 capsule by mouth  daily for depression (Patient taking differently: Take 40 mg by mouth daily. Take 1 capsule by mouth  daily for depression)  . furosemide (LASIX) 40 MG tablet Take 1.5 tablets (60 mg total) by mouth daily.  Marland Kitchen HYDROcodone-acetaminophen (NORCO) 10-325 MG per tablet Take 1 tablet by mouth every 4 (four) hours as needed for moderate pain.   Marland Kitchen LORazepam (ATIVAN) 0.5 MG tablet Take 1 tablet (0.5 mg total) by mouth 2 (two) times daily as needed for anxiety.  . meclizine (ANTIVERT) 25 MG tablet Take 1 tablet (25 mg total) by mouth 3 (three) times daily as needed for dizziness.  . methocarbamol (ROBAXIN) 500 MG tablet Take 500 mg by mouth 4 (four) times daily as needed for muscle spasms.  . Multiple Vitamin (MULTIVITAMIN) tablet Take 1 tablet by mouth daily. For supplement  . nitroGLYCERIN (NITROSTAT) 0.4 MG SL tablet Place 1 tablet (0.4 mg total) under the tongue every 5 (five) minutes as needed for chest pain (up to 3 doses).  . pantoprazole (PROTONIX) 40 MG tablet Take 1 tablet (40 mg total) by mouth 2 (two) times daily.  .  potassium chloride SA (K-DUR,KLOR-CON) 20 MEQ tablet Take 1 tablet (20 mEq total) by mouth daily.  . rosuvastatin (CRESTOR) 20 MG tablet Take 10-20 mg by mouth daily. Patient alternating taking 1 whole tablet (20 mg) by mouth once daily and 1/2 tablet (10 mg ) by mouth once daily the next day.    No facility-administered encounter medications on file as of 01/04/2017.       Review of Systems  Constitutional: Negative.   HENT: Negative.   Eyes: Negative.   Respiratory: Negative.   Cardiovascular: Negative.   Gastrointestinal: Negative.   Endocrine: Negative.   Genitourinary: Negative.   Musculoskeletal: Negative.   Skin: Negative.   Allergic/Immunologic: Negative.   Neurological: Negative.   Hematological: Negative.   Psychiatric/Behavioral: Negative.        Objective:   Physical Exam  Constitutional: She is oriented to person, place, and time. She appears well-developed and well-nourished. No distress.  HENT:  Head: Normocephalic and atraumatic.  Right Ear: External ear normal.  Left Ear: External ear normal.  Nose: Nose normal.  Mouth/Throat: Oropharynx is clear and moist.  Eyes: Conjunctivae and EOM are normal. Pupils are equal, round,  and reactive to light. Right eye exhibits no discharge. Left eye exhibits no discharge. No scleral icterus.  Neck: Normal range of motion. Neck supple. No thyromegaly present.  Cardiovascular: Normal rate, normal heart sounds and intact distal pulses.   No murmur heard. The heart is irregular irregular at 10 8/m  Pulmonary/Chest: Effort normal and breath sounds normal. No respiratory distress. She has no wheezes. She has no rales.  Clear anteriorly and posteriorly  Abdominal: Soft. Bowel sounds are normal. She exhibits no mass. There is no tenderness. There is no rebound and no guarding.  Generalized abdominal tenderness but no worse than it has been in the past. No liver or spleen enlargement. No masses.  Musculoskeletal: She exhibits no  edema or tenderness.  The patient uses a walker for ambulation because of her chronic back pain and problems since her surgery.  Lymphadenopathy:    She has no cervical adenopathy.  Neurological: She is alert and oriented to person, place, and time. She has normal reflexes. No cranial nerve deficit.  Skin: Skin is warm and dry. No rash noted.  Psychiatric: She has a normal mood and affect. Her behavior is normal. Judgment and thought content normal.  Nursing note and vitals reviewed.  BP 134/70 (BP Location: Left Arm)   Pulse 68   Temp 98.2 F (36.8 C) (Oral)   Ht 5\' 7"  (1.702 m)   Wt 199 lb (90.3 kg)   BMI 31.17 kg/m         Assessment & Plan:  1. Essential hypertension -The blood pressure is good today and she will continue with current treatment - Hepatic function panel  2. Chronic diastolic CHF (congestive heart failure) ([place]) -Follow-up with cardiology as planned  3. Paroxysmal atrial fibrillation ([facility]) -Follow-up with cardiology as planned. The rate was irregular irregular today at 10 8/m  4. Vitamin D deficiency -Continue to take vitamin D 2000 units daily - DG [facility] DEXA; Future  5. Pure hypercholesterolemia -Most recent cholesterol numbers were good other than a slightly increased triglyceride. She will continue with current treatment. - Hepatic function panel  6. Gastroesophageal reflux disease, esophagitis presence not specified -No complaints with reflux today. - Hepatic function panel  7. Screening for osteoporosis - DG [facility] DEXA; Future  8. Postmenopausal - DG [facility] DEXA; Future  9. Chronic back pain greater than 3 months duration -Continue with physical therapy once flu season is over.  10. S/P spinal fusion -Follow-up with neurosurgery in [place] as planned  11. Persistent atrial fibrillation ([place]) -Follow-up with cardiology as planned  12. Stable angina pectoris ([place]) -Continue current treatment and follow-up with cardiology  Patient  Instructions                       Medicare Annual Wellness Visit  [facility] and the medical providers at [facility] strive to bring you the best medical care.  In doing so we not only want to address your current medical conditions and concerns but also to detect new conditions early and prevent illness, disease and health-related problems.    Medicare offers a yearly Wellness Visit which allows our clinical staff to assess your need for preventative services including immunizations, lifestyle education, counseling to decrease risk of preventable diseases and screening for fall risk and other medical concerns.    This visit is provided free of charge (no copay) for all Medicare recipients. The clinical pharmacists at [facility] have begun to conduct these  Wellness Visits which will also include a thorough review of all your medications.    As you primary medical provider recommend that you make an appointment for your Annual Wellness Visit if you have not done so already this year.  You may set up this appointment before you leave today or you may call back [phone]) and schedule an appointment.  Please make sure when you call that you mention that you are scheduling your Annual Wellness Visit with the clinical pharmacist so that the appointment may be made for the proper length of time.     Continue current medications. Continue good therapeutic lifestyle changes which include good diet and exercise. Fall precautions discussed with patient. If an FOBT was given today- please return it to our front desk. If you are over 57 years old - you may need Prevnar 55 or the adult Pneumonia vaccine.  **Flu shots are available--- please call and schedule a FLU-CLINIC appointment**  After your visit with [place] today you will receive a survey in the mail or online from [employer] regarding your care with [place]. Please take a moment to fill this out. Your feedback  is very important to [place] as you can help [place] better understand your patient needs as well as improve your experience and satisfaction. WE CARE ABOUT YOU!!!  Continue to follow-up with specialists as planned Don't forget to take her vitamin D every day When the flu season has diminished, please plan to check back into physical therapy [place] so that you can get your strength back and walk better without having some much pain----please do not forget to do this.    Arrie Senate MD

## 2017-01-05 LAB — HEPATIC FUNCTION PANEL
ALT: 42 IU/L — AB (ref 0–32)
AST: 55 IU/L — ABNORMAL HIGH (ref 0–40)
Albumin: 4.2 g/dL (ref 3.5–4.8)
Alkaline Phosphatase: 143 IU/L — ABNORMAL HIGH (ref 39–117)
BILIRUBIN, DIRECT: 0.2 mg/dL (ref 0.00–0.40)
Bilirubin Total: 0.6 mg/dL (ref 0.0–1.2)
Total Protein: 6.3 g/dL (ref 6.0–8.5)

## 2017-01-05 LAB — MULTIPLE MYELOMA PANEL, SERUM
ALBUMIN SERPL ELPH-MCNC: 3.2 g/dL (ref 2.9–4.4)
ALPHA 1: 0.2 g/dL (ref 0.0–0.4)
Albumin/Glob SerPl: 1.2 (ref 0.7–1.7)
Alpha2 Glob SerPl Elph-Mcnc: 0.8 g/dL (ref 0.4–1.0)
B-Globulin SerPl Elph-Mcnc: 1 g/dL (ref 0.7–1.3)
GAMMA GLOB SERPL ELPH-MCNC: 0.8 g/dL (ref 0.4–1.8)
GLOBULIN, TOTAL: 2.8 g/dL (ref 2.2–3.9)
IGA/IMMUNOGLOBULIN A, SERUM: 154 mg/dL (ref 64–422)
IgG, Qn, Serum: 796 mg/dL (ref 700–1600)
IgM, Qn, Serum: 93 mg/dL (ref 26–217)
Total Protein: 6 g/dL (ref 6.0–8.5)

## 2017-01-06 ENCOUNTER — Ambulatory Visit: Payer: Medicare HMO | Admitting: Physician Assistant

## 2017-01-07 ENCOUNTER — Telehealth: Payer: Self-pay | Admitting: *Deleted

## 2017-01-07 NOTE — Telephone Encounter (Signed)
Called & spoke to daughter, [name] & informed of good M-protein & no MGUS progression per Dr Ernestina Penna request. Daughter expressed appreciation.

## 2017-01-07 NOTE — Telephone Encounter (Signed)
-----   Message from Truitt Merle, MD sent at 01/06/2017  5:47 PM EST ----- Please call pt and let her know the lab results: M-protein was negative (0.3 6 months ago), light chain levels are also good, no concerns of her MGUS progression.   Truitt Merle  01/06/2017

## 2017-02-01 ENCOUNTER — Other Ambulatory Visit: Payer: Self-pay | Admitting: *Deleted

## 2017-02-01 DIAGNOSIS — I2510 Atherosclerotic heart disease of native coronary artery without angina pectoris: Secondary | ICD-10-CM

## 2017-02-01 DIAGNOSIS — I480 Paroxysmal atrial fibrillation: Secondary | ICD-10-CM

## 2017-02-01 MED ORDER — PANTOPRAZOLE SODIUM 40 MG PO TBEC: 40.0000 mg | DELAYED_RELEASE_TABLET | Freq: Two times a day (BID) | ORAL | 3 refills | Status: DC

## 2017-02-01 MED ORDER — FLUOXETINE HCL 40 MG PO CAPS: ORAL_CAPSULE | ORAL | 3 refills | Status: DC

## 2017-02-02 DIAGNOSIS — E669 Obesity, unspecified: Secondary | ICD-10-CM | POA: Diagnosis not present

## 2017-02-02 DIAGNOSIS — E785 Hyperlipidemia, unspecified: Secondary | ICD-10-CM | POA: Diagnosis not present

## 2017-02-02 DIAGNOSIS — M40294 Other kyphosis, thoracic region: Secondary | ICD-10-CM | POA: Diagnosis not present

## 2017-02-02 DIAGNOSIS — I5032 Chronic diastolic (congestive) heart failure: Secondary | ICD-10-CM | POA: Diagnosis not present

## 2017-02-02 DIAGNOSIS — Z981 Arthrodesis status: Secondary | ICD-10-CM | POA: Diagnosis not present

## 2017-02-02 DIAGNOSIS — M542 Cervicalgia: Secondary | ICD-10-CM | POA: Diagnosis not present

## 2017-02-02 DIAGNOSIS — G4733 Obstructive sleep apnea (adult) (pediatric): Secondary | ICD-10-CM | POA: Diagnosis not present

## 2017-02-02 DIAGNOSIS — M5387 Other specified dorsopathies, lumbosacral region: Secondary | ICD-10-CM | POA: Diagnosis not present

## 2017-02-02 DIAGNOSIS — Z8673 Personal history of transient ischemic attack (TIA), and cerebral infarction without residual deficits: Secondary | ICD-10-CM | POA: Diagnosis not present

## 2017-02-02 DIAGNOSIS — I2510 Atherosclerotic heart disease of native coronary artery without angina pectoris: Secondary | ICD-10-CM | POA: Diagnosis not present

## 2017-02-02 DIAGNOSIS — E119 Type 2 diabetes mellitus without complications: Secondary | ICD-10-CM | POA: Diagnosis not present

## 2017-02-02 DIAGNOSIS — I110 Hypertensive heart disease with heart failure: Secondary | ICD-10-CM | POA: Diagnosis not present

## 2017-02-02 DIAGNOSIS — M549 Dorsalgia, unspecified: Secondary | ICD-10-CM | POA: Diagnosis not present

## 2017-02-02 DIAGNOSIS — Z9889 Other specified postprocedural states: Secondary | ICD-10-CM | POA: Diagnosis not present

## 2017-02-02 DIAGNOSIS — M5137 Other intervertebral disc degeneration, lumbosacral region: Secondary | ICD-10-CM | POA: Diagnosis not present

## 2017-02-02 DIAGNOSIS — M4185 Other forms of scoliosis, thoracolumbar region: Secondary | ICD-10-CM | POA: Diagnosis not present

## 2017-02-09 DIAGNOSIS — M2578 Osteophyte, vertebrae: Secondary | ICD-10-CM | POA: Diagnosis not present

## 2017-02-09 DIAGNOSIS — M4314 Spondylolisthesis, thoracic region: Secondary | ICD-10-CM | POA: Diagnosis not present

## 2017-02-10 DIAGNOSIS — Z79891 Long term (current) use of opiate analgesic: Secondary | ICD-10-CM | POA: Diagnosis not present

## 2017-02-10 DIAGNOSIS — M5136 Other intervertebral disc degeneration, lumbar region: Secondary | ICD-10-CM | POA: Diagnosis not present

## 2017-02-10 DIAGNOSIS — M961 Postlaminectomy syndrome, not elsewhere classified: Secondary | ICD-10-CM | POA: Diagnosis not present

## 2017-02-16 ENCOUNTER — Other Ambulatory Visit: Payer: Self-pay | Admitting: *Deleted

## 2017-02-16 DIAGNOSIS — Z9889 Other specified postprocedural states: Secondary | ICD-10-CM | POA: Diagnosis not present

## 2017-02-16 DIAGNOSIS — Z981 Arthrodesis status: Secondary | ICD-10-CM | POA: Diagnosis not present

## 2017-02-16 DIAGNOSIS — M40209 Unspecified kyphosis, site unspecified: Secondary | ICD-10-CM | POA: Diagnosis not present

## 2017-02-16 MED ORDER — ESZOPICLONE 3 MG PO TABS: 3.0000 mg | ORAL_TABLET | Freq: Every day | ORAL | 1 refills | Status: DC

## 2017-02-23 ENCOUNTER — Ambulatory Visit (INDEPENDENT_AMBULATORY_CARE_PROVIDER_SITE_OTHER): Payer: Medicare HMO | Admitting: Physician Assistant

## 2017-02-23 ENCOUNTER — Encounter: Payer: Self-pay | Admitting: Physician Assistant

## 2017-02-23 VITALS — BP 140/50 | HR 106 | Ht 67.0 in | Wt 199.8 lb

## 2017-02-23 DIAGNOSIS — I5032 Chronic diastolic (congestive) heart failure: Secondary | ICD-10-CM | POA: Diagnosis not present

## 2017-02-23 DIAGNOSIS — I2510 Atherosclerotic heart disease of native coronary artery without angina pectoris: Secondary | ICD-10-CM

## 2017-02-23 DIAGNOSIS — E7800 Pure hypercholesterolemia, unspecified: Secondary | ICD-10-CM | POA: Diagnosis not present

## 2017-02-23 DIAGNOSIS — I482 Chronic atrial fibrillation: Secondary | ICD-10-CM

## 2017-02-23 DIAGNOSIS — I4821 Permanent atrial fibrillation: Secondary | ICD-10-CM

## 2017-02-23 DIAGNOSIS — I10 Essential (primary) hypertension: Secondary | ICD-10-CM | POA: Diagnosis not present

## 2017-02-23 NOTE — Addendum Note (Signed)
Addended by: Michae Kava on: 02/23/2017 11:40 AM   Modules accepted: Orders

## 2017-02-23 NOTE — Progress Notes (Signed)
Cardiology Office Note:    Date:  02/23/2017   ID:  [name] [name], DOB 09-Jun-1941, MRN [id number]  PCP:  Redge Gainer, MD  Cardiologist:Dr. Kirk Ruths McLean>>patient requests Dr. Sherren Mocha  Electrophysiologist: N/a Ortho/Pain: Dr. Nelva Bush Heme/Onc: Dr. Truitt Merle  Referring MD: [name], MD   Chief Complaint  Patient presents with  . Follow-up    CAD, atrial fibrillation, heart failure    History of Present Illness:    [name] [name] is a 76 y.o. female with a hx of CAD, diastolic HF, HTN, HL, PAF. She has had multiple back surgeries and is on chronic narcotic pain medication. She has a hx of orthostatic hypotension and multiple falls as well as confusion. She had previously been kept off of anticoagulation. She had chest pain in 12/15 and Myoview was abnormal. LHC demonstrated complex bifurcational distal LM/ostial LCx disease. She was felt to be a poor CABG candidate due to difficulty with rehab. She underwent bifurcational PCI with DES to the distal LM and ostial LCx.   In 1/17, she was noted to be in AF with RVR and was placed on Eliquis.  She underwent DCCV in 2/17, but had recurrent AF. She has been managed with a rate control strategy.   In 11/17, she complained of worsening shortness of breath. Myoview was obtained and was abnormal with inferior ischemia.  LHC was arranged and demonstrated patent LM/LCx stent and no significant disease in the RCA.  She had a knot over her R wrist post cath and an ultrasound demonstrated no signs of pseudoaneurysm or AV fistula.   She returns for Cardiology follow up.  She has been experiencing more issues with her back. She seems to be relying on her walker more. She has developed some discomfort in her chest related to this. She denies exertional symptoms. She denies significant dyspnea. She denies orthopnea, PND. She has LE edema that is fairly stable without significant change. Her edema does improve after lying flat.  She denies syncope. She does note occasional palpitations.  Prior CV studies:   The following studies were reviewed today:  UE Vascular US 12/17 Patent right radial artery and veins, without evidence of fistula or pseudoaneurysm. Probable hematoma anterior to the distal right radial artery.  LHC 11/25/16 LM stent patent (stent extends from LM into pLAD) LAD irregs LCx stent patent with 25 RCA mid 40   Myoview 11/19/16 EF 48, poor quality/significant artifact; inf-lateral, inferior ischemia; Intermediate Risk  Holter 09/10/16 The basic rhythm is atrial fibrillation with average HR 97 bpm There are occasional ventricular ectopics No other arrhythmia identified No bradycardic events  Holter 3/17 The baseline rhythm is atrial fibrillation The average ventricular rate is 96 bpm There are periods of rapid atrial fib and slow atrial fib with pauses up to 3 seconds There are single PVC's or aberrated beats with a rare ventricular couplet  Echo 01/29/16 EF 50-55%, trivial AI, mild MR, mod LAE, PASP 37 mmHg  LHC 1/16 LM dist ? 50% LAD prox 30% - FFR 0.82 LCx ostial 70% - FFR 0.86 >> 0.66 RCA mid 40% CABG deferred b/c of poor functional capacity PCI: Complex bifurcational distal LM and ostial LCx with rotational atherectomy and IVUS guided with 2.75 x 12 mm Synergy DES to ostial LCx; 4 x 16 mm Synergy DES to distal LM  Myoview 12/15 Intermediate risk stress nuclear study Moderate area of anteroapical and anteroseptal ischemia Also inferolateral wall infarct with moderate peri infarct ischemia Study suggests multi  vessel diseae. LV Ejection Fraction: 57%  Echo 7/11 Mild LVH, EF 55-60%, no RWMA, mild MR  Past Medical History:  Diagnosis Date  . Anemia   . Anxiety   . Arthritis    "knees, back, fingers, toes; joints" (01/07/2015)  . CAD (coronary artery disease)    a. Abnl nuc 11/2014. Cath 12/2014 - turned down for CABG. Ultimately s/p TTVP, rotational atherectomy, PTCA  and stenting of the ostial LCx and left main into the LAD (crush technique), and IVUS of the LAD/Left main. // b. Myoview 11/17: EF 48, poor quality/significant artifact; inf-lateral, inferior ischemia; Intermediate Risk  . Chronic atrial fibrillation ([place])    a.  First noted post-op 9/15 spinal fusion. She had cardioversion, not on anticoagulation. Fall risk, unsteady. // failed DCCV // Holter 10/17: AFib, Avg HR 97, PVCs, no other arrhythmia  . Chronic back pain greater than 3 months duration    a. spinal stenosis. Spinal fusion with rods in 2/15 at [facility] spinal fusion 9/15.  Marland Kitchen Chronic diastolic CHF (congestive heart failure) ([place])   . Circadian rhythm sleep disorder   . CTS (carpal tunnel syndrome)   . Depression   . Diarrhea   . Diverticulitis of colon   . Esophageal stricture   . Gastritis   . GERD (gastroesophageal reflux disease)   . Hiatal hernia   . History of blood transfusion    "most of them related to OR's"   . History of echocardiogram    a. Echo 2/17:  EF 50-55%, trivial AI, midl MR, mod LAE, PASP 37 mmHg  . HTN (hypertension)   . Hyperlipidemia   . IBS (irritable bowel syndrome)   . Insomnia   . Memory disorder 12/04/2014  . MGUS (monoclonal gammopathy of unknown significance) dx'd 11/2014   a. Neg BMB 11/2014.  . Multiple falls   . Obesity   . Obstructive sleep apnea    "have mask; don't wear it" (01/07/2015)  . Orthostasis   . PAT (paroxysmal atrial tachycardia) ([place])   . Personal history of colonic polyps 10/25/2011 & 12/02/11   not retrieved Dr Lyla Son & tubular adenomas  . Pneumonia 03/2014  . Sinus bradycardia    a. Baseline HR 50s-60s.  . Stroke [facility]) early 2000's   "small"; denies residual on 01/07/2015)  1. Chronic low back pain: spinal stenosis. Spinal fusion with rods in 2/15 at [facility]. Repeat spinal fusion 9/15.  2. Obesity 3. H/o diverticulitis 4. IBS 5. H/o colon polyps 6. GERD: h/o hiatal hernia 7. OSA 8. HTN 9.  Hyperlipidemia 10. Depression 11. Cholecystectomy 12. H/o bilateral TKR 13. CAD: LHC (5/09) with 50% ostial LCx, 30-40% mRCA, 30% mLAD. Lexiscan Cardiolite in 11/14 with EF 68%, no ischemia or infarction. Lexiscan Cardiolite (12/15) with EF 57%, moderate anteroapical and anteroseptal ischemia, inferolateral infarct with peri-infarct ischemia =>suggestive of multivessel disease. LHC (1/16) with severe distal left main and ostial LCx stenosis confirmed by FFR. Patient had bifurcation stenting of the distal LM/ostial LCx with rotational atherectomy.  14. PNA in 3/15 15. Atrial fibrillation: Paroxysmal. Has only been noted post-op 9/15 spinal fusion. She had cardioversion, not on anticoagulation.  16. MGUS: Negative bone marrow biopsy 12/15.  17. Anemia  Past Surgical History:  Procedure Laterality Date  . BACK SURGERY    . BONE MARROW BIOPSY  11/2014  . CARDIAC CATHETERIZATION  12/27/2014   Procedure: INTRAVASCULAR PRESSURE WIRE/FFR STUDY;  Surgeon: Larey Dresser, MD;  Location: [facility] C[facility] LAB;  Service: Cardiovascular;;  .  CARDIAC CATHETERIZATION N/A 11/25/2016   Procedure: Left Heart Cath and Coronary Angiography;  Surgeon: Sherren Mocha, MD;  Location: [place] CV LAB;  Service: Cardiovascular;  Laterality: N/A;  . CARDIOVERSION N/A 02/13/2016   Procedure: CARDIOVERSION;  Surgeon: Josue Hector, MD;  Location: [facility] ENDOSCOPY;  Service: Cardiovascular;  Laterality: N/A;  . CARPAL TUNNEL RELEASE Right 1980's  . CATARACT EXTRACTION Bilateral   . CATARACT EXTRACTION W/ INTRAOCULAR LENS  IMPLANT, BILATERAL  2000's  . CORONARY ANGIOPLASTY WITH STENT PLACEMENT  01/07/2015   "2"  . CORONARY STENT PLACEMENT    . DILATION AND CURETTAGE OF UTERUS    . ESOPHAGOGASTRODUODENOSCOPY (EGD) WITH ESOPHAGEAL DILATION  "several times"  . JOINT REPLACEMENT    . KNEE ARTHROSCOPY Left 1995  . LAPAROSCOPIC CHOLECYSTECTOMY  2003  . LEFT HEART CATHETERIZATION WITH CORONARY ANGIOGRAM N/A 12/27/2014    Procedure: LEFT HEART CATHETERIZATION WITH CORONARY ANGIOGRAM;  Surgeon: Larey Dresser, MD;  Location: [facility] CATH LAB;  Service: Cardiovascular;  Laterality: N/A;  . PERCUTANEOUS CORONARY ROTOBLATOR INTERVENTION (PCI-R)  01/07/2015  . PERCUTANEOUS CORONARY ROTOBLATOR INTERVENTION (PCI-R) N/A 01/07/2015   Procedure: PERCUTANEOUS CORONARY ROTOBLATOR INTERVENTION (PCI-R);  Surgeon: Blane Ohara, MD;  Location: [facility] CATH LAB;  Service: Cardiovascular;  Laterality: N/A;  . POSTERIOR FUSION THORACIC SPINE  08/2015  . POSTERIOR LUMBAR FUSION  01/2015  . TOTAL KNEE ARTHROPLASTY Bilateral 1990's - 2000's    Current Medications: Current Meds  Medication Sig  . aspirin EC 81 MG tablet Take 1 tablet (81 mg total) by mouth daily.  . Calcium Citrate-Vitamin D (CALCIUM + D PO) Take 1 tablet by mouth daily.  . Cholecalciferol (VITAMIN D) 2000 units tablet Take 2,000 Units by mouth daily.  . clobetasol cream (TEMOVATE) 0.03 % Apply 1 application topically See admin instructions. Applies to vaginal area twice a week.  . colestipol (COLESTID) 1 g tablet Take 2 tablets (2 g total) by mouth 2 (two) times daily.  Marland Kitchen diltiazem (CARDIZEM CD) 120 MG 24 hr capsule Take 1 capsule (120 mg total) by mouth 2 (two) times daily.  . diphenoxylate-atropine (LOMOTIL) 2.5-0.025 MG per tablet Take 1 tablet by mouth 4 (four) times daily as needed for diarrhea or loose stools.   Marland Kitchen ELIQUIS 5 MG TABS tablet Take 5 mg by mouth 2 (two) times daily.  . Eszopiclone 3 MG TABS Take 1 tablet (3 mg total) by mouth at bedtime. For insomnia,take immediately before bedtime.  . fentaNYL (DURAGESIC - DOSED MCG/HR) 50 MCG/HR Place 1 patch (50 mcg total) onto the skin every 3 (three) days.  Marland Kitchen FLUoxetine (PROZAC) 40 MG capsule Take 1 capsule by mouth  daily for depression  . furosemide (LASIX) 40 MG tablet TAKE 1.5 TABLETS (60 MG TOTAL) BY MOUTH DAILY  . HYDROcodone-acetaminophen (NORCO) 10-325 MG per tablet Take 1 tablet by mouth every 4 (four) hours  as needed for moderate pain.   Marland Kitchen LORazepam (ATIVAN) 0.5 MG tablet Take 1 tablet (0.5 mg total) by mouth 2 (two) times daily as needed for anxiety.  . meclizine (ANTIVERT) 25 MG tablet Take 1 tablet (25 mg total) by mouth 3 (three) times daily as needed for dizziness.  . methocarbamol (ROBAXIN) 500 MG tablet Take 500 mg by mouth 4 (four) times daily as needed for muscle spasms.  . Multiple Vitamin (MULTIVITAMIN) tablet Take 1 tablet by mouth daily. For supplement  . nitroGLYCERIN (NITROSTAT) 0.4 MG SL tablet Place 1 tablet (0.4 mg total) under the tongue every 5 (five) minutes as  needed for chest pain (up to 3 doses).  . pantoprazole (PROTONIX) 40 MG tablet Take 1 tablet (40 mg total) by mouth 2 (two) times daily.  . potassium chloride SA (K-DUR,KLOR-CON) 20 MEQ tablet Take 1 tablet (20 mEq total) by mouth daily.  . rosuvastatin (CRESTOR) 20 MG tablet Take 10-20 mg by mouth daily. Patient alternating taking 1 whole tablet (20 mg) by mouth once daily and 1/2 tablet (10 mg ) by mouth once daily the next day.      Allergies:   Buprenorphine hcl; Cymbalta [duloxetine hcl]; Morphine and related; Oxycodone-acetaminophen; Valium [diazepam]; Statins; Altace [ramipril]; Floxin [ofloxacin]; Hydromorphone; Lovaza [omega-3-acid ethyl esters]; Trilipix [choline fenofibrate]; and Zetia [ezetimibe]   Social History   Social History  . Marital status: Widowed    Spouse name: N/A  . Number of children: 2  . Years of education: HS   Occupational History  . librarian- retired     retired   Social History Main Topics  . Smoking status: Never Smoker  . Smokeless tobacco: Never Used  . Alcohol use No     Comment: 01/07/2015 "glass of wine at Christmas, maybe"  . Drug use: No  . Sexual activity: No   Other Topics Concern  . None   Social History Narrative   Patient is right handed   Patient drinks 1-2 sodas daily.   patlient lives alone.        Family History  Problem Relation Age of Onset  .  Coronary artery disease Father   . Peripheral vascular disease Father   . Coronary artery disease Mother   . Coronary artery disease Brother   . Colon cancer Sister   . Emphysema Sister   . Sleep apnea Son   . Colon cancer Sister     spread to her brain  . Emphysema Brother   . Dementia Neg Hx      ROS:   Please see the history of present illness.    Review of Systems  Cardiovascular: Positive for chest pain.   All other systems reviewed and are negative.   EKGs/Labs/Other Test Reviewed:    EKG:  EKG is  ordered today.  The ekg ordered today demonstrates AFib, HR 112  Recent Labs: 06/22/2016: TSH 4.340 11/17/2016: BNP 257.4 01/01/2017: BUN 12.1; Creatinine 0.9; HGB 13.4; Platelets 187; Potassium 4.1; Sodium 141 01/04/2017: ALT 42   Recent Lipid Panel    Component Value Date/Time   CHOL 146 10/27/2016 1539   CHOL 156 05/11/2013 1536   TRIG 154 (H) 10/27/2016 1539   TRIG 121 02/18/2016 1504   TRIG 239 (H) 05/11/2013 1536   HDL 64 10/27/2016 1539   HDL 57 02/18/2016 1504   HDL 43 05/11/2013 1536   CHOLHDL 2.3 10/27/2016 1539   LDLCALC 51 10/27/2016 1539   LDLCALC 50 08/21/2014 1141   LDLCALC 65 05/11/2013 1536     Physical Exam:    VS:  BP (!) 140/50 (BP Location: Left Arm)   Pulse (!) 106   Ht '5\' 7"'$  (1.702 m)   Wt 199 lb 12.8 oz (90.6 kg)   BMI 31.29 kg/m     Wt Readings from Last 3 Encounters:  02/23/17 199 lb 12.8 oz (90.6 kg)  01/04/17 199 lb (90.3 kg)  01/01/17 200 lb 8 oz (90.9 kg)     Physical Exam  Constitutional: She is oriented to person, place, and time. She appears well-developed and well-nourished. No distress.  HENT:  Head: Normocephalic and atraumatic.  Eyes:  No scleral icterus.  Neck: No JVD present.  Cardiovascular: Normal rate.  An irregularly irregular rhythm present.  No murmur heard. Pulmonary/Chest: She has no wheezes. She has no rales.  Abdominal: There is no tenderness.  Musculoskeletal: She exhibits edema.  Trace bilateral  ankle edema  Neurological: She is alert and oriented to person, place, and time.  Skin: Skin is warm and dry. Ecchymosis noted.     Psychiatric: She has a normal mood and affect.    ASSESSMENT:    1. Coronary artery disease involving native coronary artery of native heart without angina pectoris   2. Chronic diastolic CHF (congestive heart failure) (HCC)   3. Permanent atrial fibrillation ([place])   4. Essential hypertension   5. Pure hypercholesterolemia    PLAN:    In order of problems listed above:  1. Coronary artery disease involving native coronary artery of native heart without angina pectoris - s/p bifurcational PCI with DES to the distal LM and ostial LCx in 1/16.  Recent Myoview suggested inferior ischemia but LHC demonstrated patent stent and no significant disease elsewhere.  She has some atypical chest discomfort that is likely related to muscular strain from using her walker. Continue aspirin, statin.  2. Chronic diastolic CHF (congestive heart failure) (HCC) - Volume appears stable. Continue current therapy. Recent creatinine normal.  3. Permanent atrial fibrillation (HCC) - s/p failed DCCV.  Holter in 9/17 demonstrated controlled ventricular response. Continue current dose of diltiazem.  4. Essential hypertension - Fair control. Continue current therapy.  5. Pure hypercholesterolemia - LDL in 11/17 was 51.  Continue Crestor 20.   Medication Adjustments/Labs and Tests Ordered: Current medicines are reviewed at length with the patient today.  Concerns regarding medicines are outlined above.  Medication changes, Labs and Tests ordered today are outlined in the Patient Instructions noted below. Patient Instructions  Medication Instructions:  1. Your physician recommends that you continue on your current medications as directed. Please refer to the Current Medication list given to you today.  Labwork: 2. BMET AND CBC TO BE DONE IN 3-4 MONTHS WHEN YOU SEE SCOTT WEAVER,  PAC   Testing/Procedures: NONE  Follow-Up: 1. SCOTT WEAVER,PAC 3-4 MONTHS  2. Your physician wants you to follow-up in: 9 MONTHS ; 11/2017 WITH DR. Emelda Fear will receive a reminder letter in the mail two months in advance. If you don't receive a letter, please call our office to schedule the follow-up appointment.  Any Other Special Instructions Will Be Listed Below (If Applicable).  If you need a refill on your cardiac medications before your next appointment, please call your pharmacy.   Return in about 3 months (around 05/26/2017) for Routine Follow Up, w/ Richardson Dopp, PA-C.   Signed, Richardson Dopp, PA-C  02/23/2017 10:43 AM    [facility] [address], [place], [place]  [address] Phone: [phone]; Fax: [phone]

## 2017-02-23 NOTE — Patient Instructions (Addendum)
Medication Instructions:  1. Your physician recommends that you continue on your current medications as directed. Please refer to the Current Medication list given to you today.  Labwork: 2. CMET AND CBC TO BE DONE IN 3-4 MONTHS WHEN YOU SEE SCOTT WEAVER, PAC   Testing/Procedures: NONE  Follow-Up: 1. SCOTT WEAVER,PAC 3-4 MONTHS  2. Your physician wants you to follow-up in: 9 MONTHS ; 11/2017 WITH DR. Emelda Fear will receive a reminder letter in the mail two months in advance. If you don't receive a letter, please call our office to schedule the follow-up appointment.  Any Other Special Instructions Will Be Listed Below (If Applicable).  If you need a refill on your cardiac medications before your next appointment, please call your pharmacy.

## 2017-02-24 ENCOUNTER — Ambulatory Visit (INDEPENDENT_AMBULATORY_CARE_PROVIDER_SITE_OTHER): Payer: Medicare HMO | Admitting: Podiatry

## 2017-02-24 ENCOUNTER — Encounter: Payer: Self-pay | Admitting: Podiatry

## 2017-02-24 VITALS — BP 130/83 | HR 76 | Resp 18

## 2017-02-24 DIAGNOSIS — M79676 Pain in unspecified toe(s): Secondary | ICD-10-CM

## 2017-02-24 DIAGNOSIS — L84 Corns and callosities: Secondary | ICD-10-CM

## 2017-02-24 DIAGNOSIS — M2041 Other hammer toe(s) (acquired), right foot: Secondary | ICD-10-CM

## 2017-02-24 DIAGNOSIS — B351 Tinea unguium: Secondary | ICD-10-CM

## 2017-02-24 NOTE — Patient Instructions (Signed)
Today your exam demonstrated mycotic or fungal toenails that I would recommend trimming with electric bur or as needed For the hammertoe I would try the simple nonsurgical treatments described below Return as needed  Hammer Toe Hammer toe is a change in the shape (a deformity) of your second, third, or fourth toe. The deformity causes the middle joint of your toe to stay bent. This causes pain, especially when you are wearing shoes. Hammer toe starts gradually. At first, the toe can be straightened. Gradually over time, the deformity becomes stiff and permanent. Early treatments to keep the toe straight may relieve pain. As the deformity becomes stiff and permanent, surgery may be needed to straighten the toe. What are the causes? Hammer toe is caused by abnormal bending of the toe joint that is closest to your foot. It happens gradually over time. This pulls on the muscles and connections (tendons) of the toe joint, making them weak and stiff. It is often related to wearing shoes that are too short or narrow and do not let your toes straighten. What increases the risk? You may be at greater risk for hammer toe if you:  Are female.  Are older.  Wear shoes that are too small.  Wear high-heeled shoes that pinch your toes.  Are a Engineer, mining.  Have a second toe that is longer than your big toe (first toe).  Injure your foot or toe.  Have arthritis.  Have a family history of hammer toe.  Have a nerve or muscle disorder. What are the signs or symptoms? The main symptoms of this condition are pain and deformity of the toe. The pain is worse when wearing shoes, walking, or running. Other symptoms may include:  Corns or calluses over the bent part of the toe or between the toes.  Redness and a burning feeling on the toe.  An open sore that forms on the top of the toe.  Not being able to straighten the toe. How is this diagnosed? This condition is diagnosed based on your symptoms  and a physical exam. During the exam, your health care provider will try to straighten your toe to see how stiff the deformity is. You may also have tests, such as:  A blood test to check for rheumatoid arthritis.  An X-ray to show how severe the deformity is. How is this treated? Treatment for this condition will depend on how stiff the deformity is. Surgery is often needed. However, sometimes a hammer toe can be straightened without surgery. Treatments that do not involve surgery include:  Taping the toe into a straightened position.  Using pads and cushions to protect the toe (orthotics).  Wearing shoes that provide enough room for the toes.  Doing toe-stretching exercises at home.  Taking an NSAID to reduce pain and swelling. If these treatments do not help or the toe cannot be straightened, surgery is the next option. The most common surgeries used to straighten a hammer toe include:  Arthroplasty. In this procedure, part of the joint is removed, and that allows the toe to straighten.  Fusion. In this procedure, cartilage between the two bones of the joint is taken out and the bones are fused together into one longer bone.  Implantation. In this procedure, part of the bone is removed and replaced with an implant to let the toe move again.  Flexor tendon transfer. In this procedure, the tendons that curl the toes down (flexor tendons) are repositioned. Follow these instructions at home:  Take over-the-counter and prescription medicines only as told by your health care provider.  Do toe straightening and stretching exercises as told by your health care provider.  Keep all follow-up visits as told by your health care provider. This is important. How is this prevented?  Wear shoes that give your toes enough room and do not cause pain.  Do not wear high-heeled shoes. Contact a health care provider if:  Your pain gets worse.  Your toe becomes red or swollen.  You develop an  open sore on your toe. This information is not intended to replace advice given to you by your health care provider. Make sure you discuss any questions you have with your health care provider. Document Released: 12/04/2000 Document Revised: 06/26/2016 Document Reviewed: 04/01/2016 Elsevier Interactive Patient Education  2017 [employer].

## 2017-02-24 NOTE — Progress Notes (Signed)
   Subjective:    Patient ID: [name] [name], female    DOB: [birth year], 76 y.o.   MRN: [id number]  HPI     This patient presents today with her daughter present in the treatment room. Patient is concerned about thickened toenails and inability to trim the toenails because the thickness. The daughter does not wish to trim the toenails. The nails have become gradually thicker and more difficult over a multiple year. Of time Patient has been to a podiatrist in the last several months and describes debridement of the nails as well as a callus. She is also complaining of pain in the second right toe and more recently bought a soft maryjane type shoe to accommodate to the hammertoe Patient patient denies smoking history  Review of Systems  Constitutional: Positive for activity change and fatigue.  Respiratory: Positive for shortness of breath.   Cardiovascular: Positive for leg swelling.  Gastrointestinal: Positive for abdominal pain and diarrhea.  Genitourinary: Positive for urgency.  Musculoskeletal: Positive for back pain and joint swelling.  Neurological: Positive for tremors and weakness.  Hematological: Bruises/bleeds easily.  All other systems reviewed and are negative.      Objective:   Physical Exam  Patient appears responsive and orientated 3  Vascular: Bilateral edema DP pulses 2/4 bilaterally PT pulses 1/4 bilaterally Capillary reflex immediate bilaterally  Neurological: Sensation to 10 g monofilament wire intact 5//5 bilaterally Vibratory sensation nonreactive bilaterally Ankle reflexes weakly reactive bilaterally  Dermatological: No open skin lesions bilaterally Partial nails on hallux bilaterally The remaining toenails are elongated, brittle with texture and color changes times a day Plantar callus fifth MPJ right  Musculoskeletal: Patient walks slowly with walker Hammered second right toe flexible at PIPJ without any skin lesions Manual motor testing dorsi  flexion, plantar flexion, inversion, eversion 5/5 bilaterally          Assessment & Plan:   Assessment: Mycotic toenails 8 Peripheral neuropathy Hammertoe second right Plantar callus fifth MPJ right  Plan: Recommended debridement of toenails and plantar callus as needed Demonstrated padding to the second right toe Discuss conservative care for treatment of hammer toes  Reappoint at patient's request

## 2017-03-29 DIAGNOSIS — L900 Lichen sclerosus et atrophicus: Secondary | ICD-10-CM | POA: Diagnosis not present

## 2017-03-29 DIAGNOSIS — Z01419 Encounter for gynecological examination (general) (routine) without abnormal findings: Secondary | ICD-10-CM | POA: Diagnosis not present

## 2017-03-29 DIAGNOSIS — Z130 Encounter for screening for diseases of the blood and blood-forming organs and certain disorders involving the immune mechanism: Secondary | ICD-10-CM | POA: Diagnosis not present

## 2017-03-29 DIAGNOSIS — Z1389 Encounter for screening for other disorder: Secondary | ICD-10-CM | POA: Diagnosis not present

## 2017-03-29 DIAGNOSIS — D649 Anemia, unspecified: Secondary | ICD-10-CM | POA: Diagnosis not present

## 2017-03-31 DIAGNOSIS — R319 Hematuria, unspecified: Secondary | ICD-10-CM | POA: Diagnosis not present

## 2017-04-28 DIAGNOSIS — D485 Neoplasm of uncertain behavior of skin: Secondary | ICD-10-CM | POA: Diagnosis not present

## 2017-04-28 DIAGNOSIS — L720 Epidermal cyst: Secondary | ICD-10-CM | POA: Diagnosis not present

## 2017-04-28 DIAGNOSIS — C44529 Squamous cell carcinoma of skin of other part of trunk: Secondary | ICD-10-CM | POA: Diagnosis not present

## 2017-04-29 ENCOUNTER — Other Ambulatory Visit: Payer: Self-pay | Admitting: *Deleted

## 2017-04-29 MED ORDER — ROSUVASTATIN CALCIUM 20 MG PO TABS: 10.0000 mg | ORAL_TABLET | Freq: Every day | ORAL | 3 refills | Status: DC

## 2017-04-29 MED ORDER — ELIQUIS 5 MG PO TABS: 5.0000 mg | ORAL_TABLET | Freq: Two times a day (BID) | ORAL | 3 refills | Status: DC

## 2017-04-30 ENCOUNTER — Other Ambulatory Visit: Payer: Self-pay | Admitting: *Deleted

## 2017-04-30 MED ORDER — ROSUVASTATIN CALCIUM 20 MG PO TABS: 20.0000 mg | ORAL_TABLET | Freq: Every day | ORAL | 3 refills | Status: DC

## 2017-05-10 ENCOUNTER — Ambulatory Visit (INDEPENDENT_AMBULATORY_CARE_PROVIDER_SITE_OTHER): Payer: Medicare HMO | Admitting: Family Medicine

## 2017-05-10 ENCOUNTER — Encounter: Payer: Self-pay | Admitting: Family Medicine

## 2017-05-10 VITALS — BP 105/59 | HR 120 | Temp 97.3°F | Ht 67.0 in | Wt 199.0 lb

## 2017-05-10 DIAGNOSIS — K219 Gastro-esophageal reflux disease without esophagitis: Secondary | ICD-10-CM

## 2017-05-10 DIAGNOSIS — E7800 Pure hypercholesterolemia, unspecified: Secondary | ICD-10-CM

## 2017-05-10 DIAGNOSIS — M549 Dorsalgia, unspecified: Secondary | ICD-10-CM | POA: Diagnosis not present

## 2017-05-10 DIAGNOSIS — R1310 Dysphagia, unspecified: Secondary | ICD-10-CM | POA: Diagnosis not present

## 2017-05-10 DIAGNOSIS — I5032 Chronic diastolic (congestive) heart failure: Secondary | ICD-10-CM | POA: Diagnosis not present

## 2017-05-10 DIAGNOSIS — D509 Iron deficiency anemia, unspecified: Secondary | ICD-10-CM | POA: Diagnosis not present

## 2017-05-10 DIAGNOSIS — E559 Vitamin D deficiency, unspecified: Secondary | ICD-10-CM | POA: Diagnosis not present

## 2017-05-10 DIAGNOSIS — I4819 Other persistent atrial fibrillation: Secondary | ICD-10-CM

## 2017-05-10 DIAGNOSIS — G8929 Other chronic pain: Secondary | ICD-10-CM | POA: Diagnosis not present

## 2017-05-10 DIAGNOSIS — I208 Other forms of angina pectoris: Secondary | ICD-10-CM | POA: Diagnosis not present

## 2017-05-10 DIAGNOSIS — I10 Essential (primary) hypertension: Secondary | ICD-10-CM

## 2017-05-10 DIAGNOSIS — I481 Persistent atrial fibrillation: Secondary | ICD-10-CM

## 2017-05-10 DIAGNOSIS — I480 Paroxysmal atrial fibrillation: Secondary | ICD-10-CM

## 2017-05-10 MED ORDER — FLUTICASONE PROPIONATE 50 MCG/ACT NA SUSP: 2.0000 | Freq: Every day | NASAL | 3 refills | Status: DC

## 2017-05-10 NOTE — Progress Notes (Signed)
Subjective:    Patient ID: [name] [name], female    DOB: 1941-05-26, 76 y.o.   MRN: [id number]  HPI Pt here for follow up and management of chronic medical problems which includes a fib, hyperlipidemia and hypertension. She is taking medication regularly.This patient has multiple medical issues and has been followed regularly by the cardiologist because of congestive heart failure. She has anxiety depression arthritis GERD hyperlipidemia and hypertension. One of her most disabling problems has been her ongoing chronic back pain for which she has had multiple surgeries. She has had coronary angioplasty with stent replacement. She has had statin intolerance in the past but has tolerated Crestor regularly. She is taking pantoprazole for her reflux. She has atrial fibrillation and is on eliquis for this. It is important to note that she had a recent CT scan of the chest because of an abnormality at the left lung base on a recent chest x-ray and this was due to a large hiatal hernia and adjacent chronic lung scarring. No acute findings were found at that time and there were no suspicious pulmonary nodules or masses. The patient will be given a copy of this report for her records. She denies any chest pain or shortness of breath. She does see Dr. Burt Knack periodically and there is no certain date for this visit. She does have trouble swallowing and with liquids going down. She does see a gastroenterologist at [facility] and we will make arrangements for her to see this person again sometime soon. She denies any blood in the stool and denies any black tarry bowel movements. She is passing her water without problems. She is going back to see the neurosurgeon next week because of her ongoing chronic back pain and indicates that he wants to do more surgery but she is very very hesitant about proceeding with any further surgery at this time. Comes to the visit today with her walker and  says she is not able to get on the table because of the severity of the back pain. She indicates she's only taking one pain pill a day because of her inactivity. The patient is also somewhat anxious and nervous today. And tearful.   Patient Active Problem List   Diagnosis Date Noted  . Abnormal nuclear cardiac imaging test   . Chronic diastolic CHF (congestive heart failure) ([place]) 07/17/2015  . Anemia, iron deficiency 07/02/2015  . DDD (degenerative disc disease), lumbar 03/22/2015  . Fall 03/22/2015  . 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 Difficulty hearing 03/22/2015  . Adaptive colitis 03/22/2015  . Lichen 38/09/1750  . Lumbar scoliosis 03/22/2015  . Fracture of pelvis ([place]) 03/22/2015  . Herpes zona 03/22/2015  . History of other specified conditions presenting hazards to health 03/22/2015  . Anemia 01/08/2015  . MGUS (monoclonal gammopathy of unknown significance) 01/08/2015  . Ischemic chest pain   . Permanent atrial fibrillation ([place]) 12/22/2014  . CAD (coronary artery disease) 12/22/2014  . Memory disorder 12/04/2014  . Deficiency anemia 11/10/2014  . Vertigo 10/27/2014  . Angulation of spine 09/18/2014  . Metabolic syndrome 02/58/5277  . HCAP (healthcare-associated pneumonia) 03/27/2014  . S/P spinal fusion 02/23/2014  . Chronic pain associated with significant psychosocial dysfunction 02/23/2014  . Preoperative evaluation of a medical condition to rule out surgical contraindications (TAR required) 10/09/2013  . Abdominal pain 07/06/2013  . Gastroesophageal hernia 07/06/2013  . Incontinence 10/13/2012  . History of IBS 01/26/2012  . 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 EDEMA 06/25/2010  .  DEGENERATIVE JOINT DISEASE 04/08/2010  . OTHER DISORDERS OF NERVOUS SYSTEM&SENSE ORGANS 04/08/2010  . Obesity (BMI 30.0-34.9) 04/04/2009  . CIRCADIAN RHYTHM SLEEP D/O DELAY SLEEP PHSE TYPE 04/04/2009  . CARPAL TUNNEL SYNDROME 04/04/2009  . Diarrhea 01/17/2009  . IRRITABLE BOWEL  SYNDROME 11/13/2008  . ESOPHAGEAL STRICTURE 11/12/2008  . GERD 11/12/2008  . DUODENITIS, WITHOUT HEMORRHAGE 11/12/2008  . Diaphragmatic hernia without mention of obstruction or gangrene 11/12/2008  . DIVERTICULOSIS, COLON 11/12/2008  . COLONIC POLYPS, HX OF 11/12/2008  . Obstructive sleep apnea 05/23/2008  . DYSPNEA 05/23/2008  . Hyperlipidemia 05/22/2008  . Depression 05/22/2008  . Essential hypertension 05/22/2008  . INSOMNIA 05/22/2008   Outpatient Encounter Prescriptions as of 05/10/2017  Medication Sig  . aspirin EC 81 MG tablet Take 1 tablet (81 mg total) by mouth daily.  . Calcium Citrate-Vitamin D (CALCIUM + D PO) Take 1 tablet by mouth daily.  . Cholecalciferol (VITAMIN D) 2000 units tablet Take 2,000 Units by mouth daily.  . clobetasol cream (TEMOVATE) 5.17 % Apply 1 application topically See admin instructions. Applies to vaginal area twice a week.  . colestipol (COLESTID) 1 g tablet Take 2 tablets (2 g total) by mouth 2 (two) times daily.  Marland Kitchen diltiazem (CARDIZEM CD) 120 MG 24 hr capsule Take 1 capsule (120 mg total) by mouth 2 (two) times daily.  . diphenoxylate-atropine (LOMOTIL) 2.5-0.025 MG per tablet Take 1 tablet by mouth 4 (four) times daily as needed for diarrhea or loose stools.   Marland Kitchen ELIQUIS 5 MG TABS tablet Take 1 tablet (5 mg total) by mouth 2 (two) times daily.  . Eszopiclone 3 MG TABS Take 1 tablet (3 mg total) by mouth at bedtime. For insomnia,take immediately before bedtime.  . fentaNYL (DURAGESIC - DOSED MCG/HR) 50 MCG/HR Place 1 patch (50 mcg total) onto the skin every 3 (three) days.  Marland Kitchen FLUoxetine (PROZAC) 40 MG capsule Take 1 capsule by mouth  daily for depression  . furosemide (LASIX) 40 MG tablet TAKE 1.5 TABLETS (60 MG TOTAL) BY MOUTH DAILY  . HYDROcodone-acetaminophen (NORCO) 10-325 MG per tablet Take 1 tablet by mouth every 4 (four) hours as needed for moderate pain.   Marland Kitchen LORazepam (ATIVAN) 0.5 MG tablet Take 1 tablet (0.5 mg total) by mouth 2 (two) times  daily as needed for anxiety.  . meclizine (ANTIVERT) 25 MG tablet Take 1 tablet (25 mg total) by mouth 3 (three) times daily as needed for dizziness.  . methocarbamol (ROBAXIN) 500 MG tablet Take 500 mg by mouth 4 (four) times daily as needed for muscle spasms.  . Multiple Vitamin (MULTIVITAMIN) tablet Take 1 tablet by mouth daily. For supplement  . nitroGLYCERIN (NITROSTAT) 0.4 MG SL tablet Place 1 tablet (0.4 mg total) under the tongue every 5 (five) minutes as needed for chest pain (up to 3 doses).  . pantoprazole (PROTONIX) 40 MG tablet Take 1 tablet (40 mg total) by mouth 2 (two) times daily.  . potassium chloride SA (K-DUR,KLOR-CON) 20 MEQ tablet Take 1 tablet (20 mEq total) by mouth daily.  . rosuvastatin (CRESTOR) 20 MG tablet Take 1 tablet (20 mg total) by mouth daily. As directed   No facility-administered encounter medications on file as of 05/10/2017.       Review of Systems  Constitutional: Negative.   HENT: Negative.   Eyes: Negative.   Respiratory: Negative.   Cardiovascular: Negative.   Gastrointestinal: Negative.   Endocrine: Negative.   Genitourinary: Negative.   Musculoskeletal: Negative.   Skin: Negative.  Allergic/Immunologic: Negative.   Neurological: Negative.   Hematological: Negative.   Psychiatric/Behavioral: Negative.        Objective:   Physical Exam  Constitutional: She is oriented to person, place, and time. She appears well-developed and well-nourished. She appears distressed.  The patient is tearful and somewhat nervous because of her ongoing back pain and current medical condition.  HENT:  Head: Normocephalic and atraumatic.  Right Ear: External ear normal.  Left Ear: External ear normal.  Mouth/Throat: Oropharynx is clear and moist. No oropharyngeal exudate.  Nasal congestion and turbinate swelling left greater than right  Eyes: Conjunctivae and EOM are normal. Pupils are equal, round, and reactive to light. Right eye exhibits no discharge.  Left eye exhibits no discharge.  Neck: Normal range of motion. Neck supple. No thyromegaly present.  No bruits thyromegaly or anterior cervical adenopathy  Cardiovascular: Normal rate, regular rhythm and normal heart sounds.   No murmur heard. The heart is irregular irregular at about 84/m  Pulmonary/Chest: Effort normal and breath sounds normal. No respiratory distress. She has no wheezes. She has no rales.  Clear anteriorly and posteriorly   Abdominal: Soft. Bowel sounds are normal. She exhibits no mass. There is tenderness. There is no rebound and no guarding.  Slight epigastric tenderness  Musculoskeletal: She exhibits no edema.  The patient uses a walker for ambulation because of need for support with moving her legs.  Lymphadenopathy:    She has no cervical adenopathy.  Neurological: She is alert and oriented to person, place, and time. She has normal reflexes. No cranial nerve deficit.  Skin: Skin is warm and dry. No rash noted.  Psychiatric: She has a normal mood and affect. Her behavior is normal. Judgment and thought content normal.  Tearful  Nursing note and vitals reviewed.  BP (!) 105/59 (BP Location: Left Arm)   Pulse (!) 120   Temp 97.3 F (36.3 C) (Oral)   Ht _0  (1.702 m)   Wt 199 lb (90.3 kg)   BMI 31.17 kg/m         Assessment & Plan:  1. Essential hypertension -The blood pressure is good today she will continue with current treatment - CBC with Differential/Platelet - BMP8+EGFR - Hepatic function panel  2. Paroxysmal atrial fibrillation (HCC) -Continue with Eliquis and follow-up with cardiology as planned - CBC with Differential/Platelet  3. Pure hypercholesterolemia -Continue with Crestor and as aggressive therapeutic lifestyle changes as possible pending results of lab work - CBC with Differential/Platelet - Lipid panel  4. Vitamin D deficiency -Continue with vitamin D replacement pending results of lab work - CBC with  Differential/Platelet - VITAMIN D 25 Hydroxy (Vit-D Deficiency, Fractures)  5. Gastroesophageal reflux disease, esophagitis presence not specified -Continue with proton X twice daily before breakfast and supper and because of increase problems with swallowing we will arrange for her to see the gastroenterologist that she has been seeing. We may need to speak with her daughter to find out the name of the specialist. - CBC with Differential/Platelet  6. Iron deficiency anemia, unspecified iron deficiency anemia type - CBC with Differential/Platelet  7. Dysphagia, unspecified type -Refer back to gastroenterology  8. Chronic diastolic CHF (congestive heart failure) ([place]) -Follow-up with cardiology as planned   9. Stable angina pectoris ([place]) -No chest pain complaints today but continue to follow-up with cardiology  10. Persistent atrial fibrillation ([place]) -Continue blood thinner and follow-up with cardiology  11. Chronic back pain greater than 3 months duration -Keep appointment with  neurosurgery  Patient Instructions                       Medicare Annual Wellness Visit  [facility] and the medical providers at [facility] strive to bring you the best medical care.  In doing so we not only want to address your current medical conditions and concerns but also to detect new conditions early and prevent illness, disease and health-related problems.    Medicare offers a yearly Wellness Visit which allows our clinical staff to assess your need for preventative services including immunizations, lifestyle education, counseling to decrease risk of preventable diseases and screening for fall risk and other medical concerns.    This visit is provided free of charge (no copay) for all Medicare recipients. The clinical pharmacists at [facility] have begun to conduct these Wellness Visits which will also include a thorough review of all your medications.     As you primary medical provider recommend that you make an appointment for your Annual Wellness Visit if you have not done so already this year.  You may set up this appointment before you leave today or you may call back ([phone]) and schedule an appointment.  Please make sure when you call that you mention that you are scheduling your Annual Wellness Visit with the clinical pharmacist so that the appointment may be made for the proper length of time.      Continue current medications. Continue good therapeutic lifestyle changes which include good diet and exercise. Fall precautions discussed with patient. If an FOBT was given today- please return it to our front desk. If you are over 83 years old - you may need Prevnar 39 or the adult Pneumonia vaccine.  **Flu shots are available--- please call and schedule a FLU-CLINIC appointment**  After your visit with [place] today you will receive a survey in the mail or online from [employer] regarding your care with [place]. Please take a moment to fill this out. Your feedback is very important to [place] as you can help [place] better understand your patient needs as well as improve your experience and satisfaction. WE CARE ABOUT YOU!!!  Because of the problems swallowing, we will try to get you back in with the gastroenterologist to further evaluate this. Keep the appointment with the neurosurgeon for next week and listened to him about what he can do to help you before you say no. Stay is active physically as possible Get the neurosurgeon to give you a prescription for physical therapy   Arrie Senate MD

## 2017-05-10 NOTE — Patient Instructions (Addendum)
Medicare Annual Wellness Visit  [facility] and the medical providers at [facility] strive to bring you the best medical care.  In doing so we not only want to address your current medical conditions and concerns but also to detect new conditions early and prevent illness, disease and health-related problems.    Medicare offers a yearly Wellness Visit which allows our clinical staff to assess your need for preventative services including immunizations, lifestyle education, counseling to decrease risk of preventable diseases and screening for fall risk and other medical concerns.    This visit is provided free of charge (no copay) for all Medicare recipients. The clinical pharmacists at [facility] have begun to conduct these Wellness Visits which will also include a thorough review of all your medications.    As you primary medical provider recommend that you make an appointment for your Annual Wellness Visit if you have not done so already this year.  You may set up this appointment before you leave today or you may call back ([phone]) and schedule an appointment.  Please make sure when you call that you mention that you are scheduling your Annual Wellness Visit with the clinical pharmacist so that the appointment may be made for the proper length of time.      Continue current medications. Continue good therapeutic lifestyle changes which include good diet and exercise. Fall precautions discussed with patient. If an FOBT was given today- please return it to our front desk. If you are over 57 years old - you may need Prevnar 70 or the adult Pneumonia vaccine.  **Flu shots are available--- please call and schedule a FLU-CLINIC appointment**  After your visit with [place] today you will receive a survey in the mail or online from [employer] regarding your care with [place]. Please take a moment to fill this out. Your feedback is very  important to [place] as you can help [place] better understand your patient needs as well as improve your experience and satisfaction. WE CARE ABOUT YOU!!!  Because of the problems swallowing, we will try to get you back in with the gastroenterologist to further evaluate this. Keep the appointment with the neurosurgeon for next week and listened to him about what he can do to help you before you say no. Stay is active physically as possible Get the neurosurgeon to give you a prescription for physical therapy

## 2017-05-11 LAB — BMP8+EGFR
BUN/Creatinine Ratio: 11 — ABNORMAL LOW (ref 12–28)
BUN: 10 mg/dL (ref 8–27)
CALCIUM: 9.3 mg/dL (ref 8.7–10.3)
CHLORIDE: 98 mmol/L (ref 96–106)
CO2: 28 mmol/L (ref 18–29)
Creatinine, Ser: 0.91 mg/dL (ref 0.57–1.00)
GFR calc Af Amer: 70 mL/min/{1.73_m2} (ref >59)
GFR, EST NON AFRICAN AMERICAN: 61 mL/min/{1.73_m2} (ref >59)
Glucose: 110 mg/dL — ABNORMAL HIGH (ref 65–99)
POTASSIUM: 4.5 mmol/L (ref 3.5–5.2)
Sodium: 139 mmol/L (ref 134–144)

## 2017-05-11 LAB — HEPATIC FUNCTION PANEL
ALBUMIN: 3.9 g/dL (ref 3.5–4.8)
ALT: 82 IU/L — ABNORMAL HIGH (ref 0–32)
AST: 46 IU/L — ABNORMAL HIGH (ref 0–40)
Alkaline Phosphatase: 195 IU/L — ABNORMAL HIGH (ref 39–117)
BILIRUBIN TOTAL: 0.4 mg/dL (ref 0.0–1.2)
Bilirubin, Direct: 0.15 mg/dL (ref 0.00–0.40)
Total Protein: 6.5 g/dL (ref 6.0–8.5)

## 2017-05-11 LAB — LIPID PANEL
CHOL/HDL RATIO: 2.3 ratio (ref 0.0–4.4)
Cholesterol, Total: 142 mg/dL (ref 100–199)
HDL: 63 mg/dL (ref >39)
LDL Calculated: 59 mg/dL (ref 0–99)
TRIGLYCERIDES: 101 mg/dL (ref 0–149)
VLDL Cholesterol Cal: 20 mg/dL (ref 5–40)

## 2017-05-11 LAB — CBC WITH DIFFERENTIAL/PLATELET
Basophils Absolute: 0 10*3/uL (ref 0.0–0.2)
Basos: 0 %
EOS (ABSOLUTE): 0.2 10*3/uL (ref 0.0–0.4)
EOS: 2 %
HEMATOCRIT: 38.7 % (ref 34.0–46.6)
Hemoglobin: 12.5 g/dL (ref 11.1–15.9)
IMMATURE GRANS (ABS): 0 10*3/uL (ref 0.0–0.1)
IMMATURE GRANULOCYTES: 0 %
LYMPHS: 19 %
Lymphocytes Absolute: 1.6 10*3/uL (ref 0.7–3.1)
MCH: 29.8 pg (ref 26.6–33.0)
MCHC: 32.3 g/dL (ref 31.5–35.7)
MCV: 92 fL (ref 79–97)
MONOS ABS: 0.8 10*3/uL (ref 0.1–0.9)
Monocytes: 9 %
NEUTROS PCT: 70 %
Neutrophils Absolute: 5.6 10*3/uL (ref 1.4–7.0)
PLATELETS: 213 10*3/uL (ref 150–379)
RBC: 4.19 x10E6/uL (ref 3.77–5.28)
RDW: 13.9 % (ref 12.3–15.4)
WBC: 8.1 10*3/uL (ref 3.4–10.8)

## 2017-05-11 LAB — VITAMIN D 25 HYDROXY (VIT D DEFICIENCY, FRACTURES): Vit D, 25-Hydroxy: 48.8 ng/mL (ref 30.0–100.0)

## 2017-05-12 ENCOUNTER — Other Ambulatory Visit: Payer: Self-pay | Admitting: *Deleted

## 2017-05-12 DIAGNOSIS — R945 Abnormal results of liver function studies: Principal | ICD-10-CM

## 2017-05-12 DIAGNOSIS — R7989 Other specified abnormal findings of blood chemistry: Secondary | ICD-10-CM

## 2017-05-18 DIAGNOSIS — Z9889 Other specified postprocedural states: Secondary | ICD-10-CM | POA: Diagnosis not present

## 2017-05-18 DIAGNOSIS — Z981 Arthrodesis status: Secondary | ICD-10-CM | POA: Diagnosis not present

## 2017-05-18 DIAGNOSIS — M40209 Unspecified kyphosis, site unspecified: Secondary | ICD-10-CM | POA: Diagnosis not present

## 2017-05-21 ENCOUNTER — Ambulatory Visit (HOSPITAL_COMMUNITY): Payer: Medicare HMO

## 2017-05-24 ENCOUNTER — Ambulatory Visit (HOSPITAL_COMMUNITY)
Admission: RE | Admit: 2017-05-24 | Discharge: 2017-05-24 | Disposition: A | Discharge status: Home / Self Care | Payer: Medicare HMO | Source: Ambulatory Visit | Attending: Family Medicine | Admitting: Family Medicine

## 2017-05-24 DIAGNOSIS — R945 Abnormal results of liver function studies: Secondary | ICD-10-CM

## 2017-05-24 DIAGNOSIS — N261 Atrophy of kidney (terminal): Secondary | ICD-10-CM | POA: Diagnosis not present

## 2017-05-24 DIAGNOSIS — R7989 Other specified abnormal findings of blood chemistry: Secondary | ICD-10-CM | POA: Insufficient documentation

## 2017-05-24 DIAGNOSIS — Z9049 Acquired absence of other specified parts of digestive tract: Secondary | ICD-10-CM | POA: Diagnosis not present

## 2017-05-24 DIAGNOSIS — R932 Abnormal findings on diagnostic imaging of liver and biliary tract: Secondary | ICD-10-CM | POA: Diagnosis not present

## 2017-05-26 ENCOUNTER — Ambulatory Visit (INDEPENDENT_AMBULATORY_CARE_PROVIDER_SITE_OTHER): Payer: Medicare HMO | Admitting: Physician Assistant

## 2017-05-26 ENCOUNTER — Other Ambulatory Visit: Payer: Medicare HMO

## 2017-05-26 ENCOUNTER — Encounter: Payer: Self-pay | Admitting: Physician Assistant

## 2017-05-26 VITALS — BP 130/60 | HR 105 | Ht 67.0 in | Wt 197.0 lb

## 2017-05-26 DIAGNOSIS — E7800 Pure hypercholesterolemia, unspecified: Secondary | ICD-10-CM | POA: Diagnosis not present

## 2017-05-26 DIAGNOSIS — I2510 Atherosclerotic heart disease of native coronary artery without angina pectoris: Secondary | ICD-10-CM | POA: Diagnosis not present

## 2017-05-26 DIAGNOSIS — I10 Essential (primary) hypertension: Secondary | ICD-10-CM

## 2017-05-26 DIAGNOSIS — I4821 Permanent atrial fibrillation: Secondary | ICD-10-CM

## 2017-05-26 DIAGNOSIS — I482 Chronic atrial fibrillation: Secondary | ICD-10-CM

## 2017-05-26 DIAGNOSIS — I5032 Chronic diastolic (congestive) heart failure: Secondary | ICD-10-CM | POA: Diagnosis not present

## 2017-05-26 NOTE — Progress Notes (Signed)
Cardiology Office Note:    Date:  05/26/2017   ID:  [name] [name], DOB 06/17/1941, MRN [id number]  PCP:  [name] Herb, MD  Cardiologist:  Dr. Loralie [name]  >>   Dr. Sherren [name]   Electrophysiologist:  N/a Ortho/Pain:  Dr. Nelva [name] Heme/Onc: Dr. Truitt [name]  Referring MD: [name] Herb, MD   Chief Complaint  Patient presents with  . Follow-up    CAD, AFib, CHF    History of Present Illness:    [name] [name] is a 76 y.o. female with a hx of CAD, diastolic HF, HTN, HL, PAF.  She has had multiple back surgeries and is on chronic narcotic pain medication.  She has a hx of orthostatic hypotension and multiple falls as well as confusion.  She had previously been kept off of anticoagulation.  She had chest pain in 12/15 and Myoview was abnormal.  LHC demonstrated complex bifurcational distal LM/ostial LCx disease. She was felt to be a poor CABG candidate due to difficulty with rehab.  She underwent bifurcational PCI with DES to the distal LM and ostial LCx.  She had recurrent atrial fibrillation in 1/17 and was placed on apixaban for anticoagulation. She underwent cardioversion but had recurrent atrial fibrillation and has been managed with rate control. She had an abnormal nuclear stress test in 11/17 with evidence of inferior ischemia (done for worsening shortness of breath). Cardiac catheterization demonstrated patent stents and no significant disease in the RCA.  She was last seen in 02/2017.  She did have follow-up blood work with her primary care doctor recently. This demonstrated elevated LFTs in her statin was placed on hold. She had an ultrasound that demonstrated evidence of hepatic steatosis.   [name] [name] returns for cardiology follow-up. She is here with her daughter. She continues to feel about the same. She has chronic dyspnea without change. She denies chest discomfort. She denies syncope. She denies orthopnea or PND. She does have LE edema that worsens throughout the day. She denies  any bleeding issues. She does have some occasional bruising.  Prior CV studies:   The following studies were reviewed today:  UE Vascular US 12/17 Patent right radial artery and veins, without evidence of fistula or pseudoaneurysm. Probable hematoma anterior to the distal right radial artery.   LHC 11/25/16 LM stent patent (stent extends from LM into pLAD) LAD irregs LCx stent patent with 25 RCA mid 40   Myoview 11/19/16 EF 48, poor quality/significant artifact; inf-lateral, inferior ischemia; Intermediate Risk   Holter 09/10/16 The basic rhythm is atrial fibrillation with average HR 97 bpm There are occasional ventricular ectopics No other arrhythmia identified No bradycardic events   Holter 3/17 The baseline rhythm is atrial fibrillation The average ventricular rate is 96 bpm There are periods of rapid atrial fib and slow atrial fib with pauses up to 3 seconds There are single PVC's or aberrated beats with a rare ventricular couplet   Echo 01/29/16 EF 50-55%, trivial AI, mild MR, mod LAE, PASP 37 mmHg   LHC 1/16 LM dist ? 50% LAD prox 30% - FFR 0.82 LCx ostial 70% - FFR 0.86 >> 0.66 RCA mid 40% CABG deferred b/c of poor functional capacity PCI:  Complex bifurcational distal LM and ostial LCx with rotational atherectomy and IVUS guided with 2.75 x 12 mm Synergy DES to ostial LCx;  4 x 16 mm Synergy DES to distal LM   Myoview 12/15   Intermediate risk stress nuclear study Moderate area of anteroapical  and anteroseptal ischemia Also inferolateral wall infarct with moderate peri infarct ischemia  Study suggests multi vessel diseae.  LV Ejection Fraction: 57%   Echo 7/11 Mild LVH, EF 55-60%, no RWMA, mild MR   Past Medical History:  Diagnosis Date  . Anemia   . Anxiety   . Arthritis    "knees, back, fingers, toes; joints" (01/07/2015)  . CAD (coronary artery disease)    a. Abnl nuc 11/2014. Cath 12/2014 - turned down for CABG. Ultimately s/p TTVP, rotational atherectomy,  PTCA and stenting of the ostial LCx and left main into the LAD (crush technique), and IVUS of the LAD/Left main. // b. Myoview 11/17: EF 48, poor quality/significant artifact; inf-lateral, inferior ischemia; Intermediate Risk  . Chronic atrial fibrillation ([place])    a.  First noted post-op 9/15 spinal fusion. She had cardioversion, not on anticoagulation. Fall risk, unsteady. // failed DCCV // Holter 10/17: AFib, Avg HR 97, PVCs, no other arrhythmia  . Chronic back pain greater than 3 months duration    a. spinal stenosis. Spinal fusion with rods in 2/15 at [facility] fusion 9/15.  Marland Kitchen Chronic diastolic CHF (congestive heart failure) ([place])   . Circadian rhythm sleep disorder   . CTS (carpal tunnel syndrome)   . Depression   . Diarrhea   . Diverticulitis of colon   . Esophageal stricture   . Gastritis   . GERD (gastroesophageal reflux disease)   . Hiatal hernia   . History of blood transfusion    "most of them related to OR's"   . History of echocardiogram    a. Echo 2/17:  EF 50-55%, trivial AI, midl MR, mod LAE, PASP 37 mmHg  . HTN (hypertension)   . Hyperlipidemia   . IBS (irritable bowel syndrome)   . Insomnia   . Memory disorder 12/04/2014  . MGUS (monoclonal gammopathy of unknown significance) dx'd 11/2014   a. Neg BMB 11/2014.  . Multiple falls   . Obesity   . Obstructive sleep apnea    "have mask; don't wear it" (01/07/2015)  . Orthostasis   . PAT (paroxysmal atrial tachycardia) ([place])   . Personal history of colonic polyps 10/25/2011 & 12/02/11   not retrieved Dr Lyla Son & tubular adenomas  . Pneumonia 03/2014  . Sinus bradycardia    a. Baseline HR 50s-60s.  . Stroke [facility]) early 2000's   "small"; denies residual on 01/07/2015)  1. Chronic low back pain: spinal stenosis.  Spinal fusion with rods in 2/15 at [facility].  Repeat spinal fusion 9/15.   2. Obesity 3. H/o diverticulitis 4. IBS 5. H/o colon polyps 6. GERD: h/o hiatal hernia 7. OSA 8. HTN 9.  Hyperlipidemia 10. Depression 11. Cholecystectomy 12. H/o bilateral TKR 13. CAD: LHC (5/09) with 50% ostial LCx, 30-40% mRCA, 30% mLAD.  Lexiscan Cardiolite in 11/14 with EF 68%, no ischemia or infarction. Lexiscan Cardiolite (12/15) with EF 57%, moderate anteroapical and anteroseptal ischemia, inferolateral infarct with peri-infarct ischemia => suggestive of multivessel disease. LHC (1/16) with severe distal left main and ostial LCx stenosis confirmed by FFR.  Patient had bifurcation stenting of the distal LM/ostial LCx with rotational atherectomy.   14. PNA in 3/15 15. Atrial fibrillation: Paroxysmal.  Has only been noted post-op 9/15 spinal fusion.  She had cardioversion, not on anticoagulation.   16. MGUS: Negative bone marrow biopsy 12/15.   17. Anemia   Past Surgical History:  Procedure Laterality Date  . BACK SURGERY    . BONE MARROW  BIOPSY  11/2014  . CARDIAC CATHETERIZATION  12/27/2014   Procedure: INTRAVASCULAR PRESSURE WIRE/FFR STUDY;  Surgeon: Larey Dresser, MD;  Location: [facility] CATH LAB;  Service: Cardiovascular;;  . CARDIAC CATHETERIZATION N/A 11/25/2016   Procedure: Left Heart Cath and Coronary Angiography;  Surgeon: [name] Mocha, MD;  Location: [place] CV LAB;  Service: Cardiovascular;  Laterality: N/A;  . CARDIOVERSION N/A 02/13/2016   Procedure: CARDIOVERSION;  Surgeon: Josue Hector, MD;  Location: [facility] ENDOSCOPY;  Service: Cardiovascular;  Laterality: N/A;  . CARPAL TUNNEL RELEASE Right 1980's  . CATARACT EXTRACTION Bilateral   . CATARACT EXTRACTION W/ INTRAOCULAR LENS  IMPLANT, BILATERAL  2000's  . CORONARY ANGIOPLASTY WITH STENT PLACEMENT  01/07/2015   "2"  . CORONARY STENT PLACEMENT    . DILATION AND CURETTAGE OF UTERUS    . ESOPHAGOGASTRODUODENOSCOPY (EGD) WITH ESOPHAGEAL DILATION  "several times"  . JOINT REPLACEMENT    . KNEE ARTHROSCOPY Left 1995  . LAPAROSCOPIC CHOLECYSTECTOMY  2003  . LEFT HEART CATHETERIZATION WITH CORONARY ANGIOGRAM N/A 12/27/2014    Procedure: LEFT HEART CATHETERIZATION WITH CORONARY ANGIOGRAM;  Surgeon: Larey Dresser, MD;  Location: [facility] CATH LAB;  Service: Cardiovascular;  Laterality: N/A;  . PERCUTANEOUS CORONARY ROTOBLATOR INTERVENTION (PCI-R)  01/07/2015  . PERCUTANEOUS CORONARY ROTOBLATOR INTERVENTION (PCI-R) N/A 01/07/2015   Procedure: PERCUTANEOUS CORONARY ROTOBLATOR INTERVENTION (PCI-R);  Surgeon: Blane Ohara, MD;  Location: [facility] CATH LAB;  Service: Cardiovascular;  Laterality: N/A;  . POSTERIOR FUSION THORACIC SPINE  08/2015  . POSTERIOR LUMBAR FUSION  01/2015  . TOTAL KNEE ARTHROPLASTY Bilateral 1990's - 2000's    Current Medications: Current Meds  Medication Sig  . aspirin EC 81 MG tablet Take 1 tablet (81 mg total) by mouth daily.  . Calcium Citrate-Vitamin D (CALCIUM + D PO) Take 1 tablet by mouth daily.  . Cholecalciferol (VITAMIN D) 2000 units tablet Take 2,000 Units by mouth daily.  . clobetasol cream (TEMOVATE) 2.54 % Apply 1 application topically See admin instructions. Applies to vaginal area twice a week.  . colestipol (COLESTID) 1 g tablet Take 2 tablets (2 g total) by mouth 2 (two) times daily.  Marland Kitchen diltiazem (CARDIZEM CD) 120 MG 24 hr capsule Take 1 capsule (120 mg total) by mouth 2 (two) times daily.  . diphenoxylate-atropine (LOMOTIL) 2.5-0.025 MG per tablet Take 1 tablet by mouth 4 (four) times daily as needed for diarrhea or loose stools.   Marland Kitchen ELIQUIS 5 MG TABS tablet Take 1 tablet (5 mg total) by mouth 2 (two) times daily.  . Eszopiclone 3 MG TABS Take 1 tablet (3 mg total) by mouth at bedtime. For insomnia,take immediately before bedtime.  . fentaNYL (DURAGESIC - DOSED MCG/HR) 50 MCG/HR Place 1 patch (50 mcg total) onto the skin every 3 (three) days.  Marland Kitchen FLUoxetine (PROZAC) 40 MG capsule Take 1 capsule by mouth  daily for depression  . fluticasone (FLONASE) 50 MCG/ACT nasal spray Place 2 sprays into both nostrils daily.  . furosemide (LASIX) 40 MG tablet TAKE 1.5 TABLETS (60 MG TOTAL) BY MOUTH  DAILY  . HYDROcodone-acetaminophen (NORCO) 10-325 MG per tablet Take 1 tablet by mouth every 4 (four) hours as needed for moderate pain.   Marland Kitchen LORazepam (ATIVAN) 0.5 MG tablet Take 1 tablet (0.5 mg total) by mouth 2 (two) times daily as needed for anxiety.  . meclizine (ANTIVERT) 25 MG tablet Take 1 tablet (25 mg total) by mouth 3 (three) times daily as needed for dizziness.  . methocarbamol (ROBAXIN) 500 MG tablet  Take 500 mg by mouth 4 (four) times daily as needed for muscle spasms.  . Multiple Vitamin (MULTIVITAMIN) tablet Take 1 tablet by mouth daily. For supplement  . nitroGLYCERIN (NITROSTAT) 0.4 MG SL tablet Place 1 tablet (0.4 mg total) under the tongue every 5 (five) minutes as needed for chest pain (up to 3 doses).  . pantoprazole (PROTONIX) 40 MG tablet Take 1 tablet (40 mg total) by mouth 2 (two) times daily.  . potassium chloride SA (K-DUR,KLOR-CON) 20 MEQ tablet Take 1 tablet (20 mEq total) by mouth daily.  . rosuvastatin (CRESTOR) 20 MG tablet Take 20 mg by mouth daily. ON HOLD FOR 3 WEEKS     Allergies:   Buprenorphine hcl; Cymbalta [duloxetine hcl]; Morphine and related; Oxycodone-acetaminophen; Valium [diazepam]; Statins; Altace [ramipril]; Floxin [ofloxacin]; Hydromorphone; Lovaza [omega-3-acid ethyl esters]; Trilipix [choline fenofibrate]; and Zetia [ezetimibe]   Social History   Social History  . Marital status: Widowed    Spouse name: N/A  . Number of children: 2  . Years of education: HS   Occupational History  . librarian- retired     retired   Social History Main Topics  . Smoking status: Never Smoker  . Smokeless tobacco: Never Used  . Alcohol use No     Comment: 01/07/2015 "glass of wine at Christmas, maybe"  . Drug use: No  . Sexual activity: No   Other Topics Concern  . None   Social History Narrative   Patient is right handed   Patient drinks 1-2 sodas daily.   patlient lives alone.        Family Hx: The patient's family history includes Colon  cancer in her sister and sister; Coronary artery disease in her brother, father, and mother; Emphysema in her brother and sister; Peripheral vascular disease in her father; Sleep apnea in her son. There is no history of Dementia.  ROS:   Please see the history of present illness.    Review of Systems  Cardiovascular: Positive for dyspnea on exertion.   All other systems reviewed and are negative.   EKGs/Labs/Other Test Reviewed:    EKG:  EKG is  ordered today.  The ekg ordered today demonstrates Atrial fibrillation, HR 105  Recent Labs: 06/22/2016: TSH 4.340 11/17/2016: BNP 257.4 05/10/2017: ALT 82; BUN 10; Creatinine, Ser 0.91; Hemoglobin 12.5; Platelets 213; Potassium 4.5; Sodium 139   Recent Lipid Panel Lab Results  Component Value Date/Time   CHOL 142 05/10/2017 12:34 PM   CHOL 156 05/11/2013 03:36 PM   TRIG 101 05/10/2017 12:34 PM   TRIG 121 02/18/2016 03:04 PM   TRIG 239 (H) 05/11/2013 03:36 PM   HDL 63 05/10/2017 12:34 PM   HDL 57 02/18/2016 03:04 PM   HDL 43 05/11/2013 03:36 PM   CHOLHDL 2.3 05/10/2017 12:34 PM   LDLCALC 59 05/10/2017 12:34 PM   LDLCALC 50 08/21/2014 11:41 AM   LDLCALC 65 05/11/2013 03:36 PM    Physical Exam:    VS:  BP 130/60   Pulse (!) 105   Ht _0  (1.702 m)   Wt 197 lb (89.4 kg)   BMI 30.85 kg/m     Wt Readings from Last 3 Encounters:  05/26/17 197 lb (89.4 kg)  05/10/17 199 lb (90.3 kg)  02/23/17 199 lb 12.8 oz (90.6 kg)     Physical Exam  Constitutional: She is oriented to person, place, and time. She appears well-developed and well-nourished.  HENT:  Head: Normocephalic and atraumatic.  Eyes: No scleral icterus.  Neck: No JVD ( no JVD at 90) present.  Cardiovascular: Normal rate.  An irregularly irregular rhythm present.  No murmur heard. Pulmonary/Chest: She has no wheezes. She has no rales.  Abdominal: Soft.  Musculoskeletal: She exhibits edema ( trace bilateral LE edema).  Neurological: She is alert and oriented to  person, place, and time.  Skin: Skin is warm and dry.  Psychiatric: She has a normal mood and affect.    ASSESSMENT:    1. Coronary artery disease involving native coronary artery of native heart without angina pectoris   2. Permanent atrial fibrillation ([place])   3. Chronic diastolic CHF (congestive heart failure) ([place])   4. Essential hypertension   5. Pure hypercholesterolemia    PLAN:    In order of problems listed above:  1. Coronary artery disease involving native coronary artery of native heart without angina pectoris -  s/p bifurcational PCI with DES to the distal LM and ostial LCx in 1/16.  LHC in 12/17 demonstrated patent distal left main and ostial LCx stents. She denies anginal symptoms. Given her complex left main bifurcational stenting, I think she should remain on aspirin. Statin is currently on hold secondary to elevated LFTs.  2. Permanent atrial fibrillation ([facility]) - Heart rate control is adequate. She continues to tolerate apixaban. Recent creatinine and hemoglobin stable.  3. Chronic diastolic CHF (congestive heart failure) ([facility]) - NYHA 2b-3. Volume currently stable. I suspect her dyspnea is multifactorial.  4. Essential hypertension - The patient's blood pressure is controlled on her current regimen.  Continue current therapy.    5. Pure hypercholesterolemia - Statin currently on hold. Ultrasound demonstrated hepatic steatosis. Hopefully statin can be resumed for secondary prevention. Her PCP is currently managing this.   Dispo:  Return in about 6 months (around 11/25/2017) for Routine Follow Up, w/ Dr. Burt Knack.   Medication Adjustments/Labs and Tests Ordered: Current medicines are reviewed at length with the patient today.  Concerns regarding medicines are outlined above.  Orders/Tests:  Orders Placed This Encounter  Procedures  . EKG 12-Lead   Medication changes: No orders of the defined types were placed in this encounter.  Signed, Richardson Dopp, PA-C    05/26/2017 2:49 PM    [facility] [place], [place], [place]  [address] Phone: [phone]; Fax: [phone]

## 2017-05-26 NOTE — Patient Instructions (Signed)
Medication Instructions:  None   Labwork: None   Testing/Procedures: None   Follow-Up: Dr. Sherren Mocha in 6 months.   Any Other Special Instructions Will Be Listed Below (If Applicable).  If you need a refill on your cardiac medications before your next appointment, please call your pharmacy.

## 2017-06-01 ENCOUNTER — Other Ambulatory Visit: Payer: Medicare HMO

## 2017-06-01 DIAGNOSIS — R7989 Other specified abnormal findings of blood chemistry: Secondary | ICD-10-CM

## 2017-06-02 ENCOUNTER — Encounter: Payer: Self-pay | Admitting: *Deleted

## 2017-06-02 LAB — HEPATIC FUNCTION PANEL
ALBUMIN: 4 g/dL (ref 3.5–4.8)
ALT: 22 IU/L (ref 0–32)
AST: 26 IU/L (ref 0–40)
Alkaline Phosphatase: 142 IU/L — ABNORMAL HIGH (ref 39–117)
BILIRUBIN TOTAL: 0.4 mg/dL (ref 0.0–1.2)
Bilirubin, Direct: 0.13 mg/dL (ref 0.00–0.40)
Total Protein: 6.2 g/dL (ref 6.0–8.5)

## 2017-06-14 DIAGNOSIS — G894 Chronic pain syndrome: Secondary | ICD-10-CM | POA: Diagnosis not present

## 2017-06-16 ENCOUNTER — Telehealth: Payer: Self-pay | Admitting: *Deleted

## 2017-06-16 NOTE — Telephone Encounter (Signed)
Please make sure this is okay with cardiology

## 2017-06-16 NOTE — Telephone Encounter (Signed)
OK to hold Eliquis 48 hours prior to procedure

## 2017-06-17 DIAGNOSIS — L720 Epidermal cyst: Secondary | ICD-10-CM | POA: Diagnosis not present

## 2017-06-20 ENCOUNTER — Telehealth: Payer: Self-pay | Admitting: Hematology

## 2017-06-20 NOTE — Telephone Encounter (Signed)
S/w pt, advised appt 7/12 (md call day) moved to 8/2 @ 2pm. Pt says she will have her daughter call us if she needs to chg appt.

## 2017-06-22 DIAGNOSIS — L905 Scar conditions and fibrosis of skin: Secondary | ICD-10-CM | POA: Diagnosis not present

## 2017-06-22 DIAGNOSIS — Z85828 Personal history of other malignant neoplasm of skin: Secondary | ICD-10-CM | POA: Diagnosis not present

## 2017-06-22 DIAGNOSIS — L728 Other follicular cysts of the skin and subcutaneous tissue: Secondary | ICD-10-CM | POA: Diagnosis not present

## 2017-07-01 ENCOUNTER — Ambulatory Visit: Payer: Medicare HMO | Admitting: Hematology

## 2017-07-01 ENCOUNTER — Other Ambulatory Visit: Payer: Medicare HMO

## 2017-07-07 ENCOUNTER — Other Ambulatory Visit: Payer: Medicare HMO

## 2017-07-07 DIAGNOSIS — R7989 Other specified abnormal findings of blood chemistry: Secondary | ICD-10-CM | POA: Diagnosis not present

## 2017-07-07 DIAGNOSIS — R945 Abnormal results of liver function studies: Principal | ICD-10-CM

## 2017-07-08 ENCOUNTER — Other Ambulatory Visit: Payer: Self-pay | Admitting: *Deleted

## 2017-07-08 DIAGNOSIS — R748 Abnormal levels of other serum enzymes: Secondary | ICD-10-CM

## 2017-07-08 LAB — HEPATIC FUNCTION PANEL
ALBUMIN: 4.1 g/dL (ref 3.5–4.8)
ALT: 31 IU/L (ref 0–32)
AST: 29 IU/L (ref 0–40)
Alkaline Phosphatase: 175 IU/L — ABNORMAL HIGH (ref 39–117)
Bilirubin Total: 0.3 mg/dL (ref 0.0–1.2)
Bilirubin, Direct: 0.11 mg/dL (ref 0.00–0.40)
Total Protein: 6.5 g/dL (ref 6.0–8.5)

## 2017-07-15 DIAGNOSIS — I10 Essential (primary) hypertension: Secondary | ICD-10-CM | POA: Diagnosis not present

## 2017-07-15 DIAGNOSIS — R231 Pallor: Secondary | ICD-10-CM | POA: Diagnosis not present

## 2017-07-15 DIAGNOSIS — H40003 Preglaucoma, unspecified, bilateral: Secondary | ICD-10-CM | POA: Diagnosis not present

## 2017-07-15 DIAGNOSIS — H00029 Hordeolum internum unspecified eye, unspecified eyelid: Secondary | ICD-10-CM | POA: Diagnosis not present

## 2017-07-22 ENCOUNTER — Telehealth: Payer: Self-pay | Admitting: Hematology

## 2017-07-22 ENCOUNTER — Ambulatory Visit (HOSPITAL_BASED_OUTPATIENT_CLINIC_OR_DEPARTMENT_OTHER): Payer: Medicare HMO | Admitting: Hematology

## 2017-07-22 ENCOUNTER — Other Ambulatory Visit (HOSPITAL_BASED_OUTPATIENT_CLINIC_OR_DEPARTMENT_OTHER): Payer: Medicare HMO

## 2017-07-22 ENCOUNTER — Encounter: Payer: Self-pay | Admitting: Hematology

## 2017-07-22 VITALS — BP 121/60 | HR 100 | Resp 17 | Ht 67.0 in | Wt 197.4 lb

## 2017-07-22 DIAGNOSIS — I10 Essential (primary) hypertension: Secondary | ICD-10-CM

## 2017-07-22 DIAGNOSIS — I4891 Unspecified atrial fibrillation: Secondary | ICD-10-CM | POA: Diagnosis not present

## 2017-07-22 DIAGNOSIS — D472 Monoclonal gammopathy: Secondary | ICD-10-CM

## 2017-07-22 DIAGNOSIS — R5383 Other fatigue: Secondary | ICD-10-CM

## 2017-07-22 DIAGNOSIS — D509 Iron deficiency anemia, unspecified: Secondary | ICD-10-CM

## 2017-07-22 LAB — COMPREHENSIVE METABOLIC PANEL
ALBUMIN: 3.5 g/dL (ref 3.5–5.0)
ALK PHOS: 142 U/L (ref 40–150)
ALT: 29 U/L (ref 0–55)
AST: 27 U/L (ref 5–34)
Anion Gap: 8 mEq/L (ref 3–11)
BILIRUBIN TOTAL: 0.52 mg/dL (ref 0.20–1.20)
BUN: 10.2 mg/dL (ref 7.0–26.0)
CALCIUM: 9.3 mg/dL (ref 8.4–10.4)
CO2: 30 mEq/L — ABNORMAL HIGH (ref 22–29)
Chloride: 102 mEq/L (ref 98–109)
Creatinine: 0.8 mg/dL (ref 0.6–1.1)
EGFR: 67 mL/min/{1.73_m2} — ABNORMAL LOW (ref >90)
GLUCOSE: 106 mg/dL (ref 70–140)
Potassium: 3.9 mEq/L (ref 3.5–5.1)
SODIUM: 140 meq/L (ref 136–145)
TOTAL PROTEIN: 6.5 g/dL (ref 6.4–8.3)

## 2017-07-22 LAB — CBC WITH DIFFERENTIAL/PLATELET
BASO%: 0.5 % (ref 0.0–2.0)
Basophils Absolute: 0 10*3/uL (ref 0.0–0.1)
EOS ABS: 0.1 10*3/uL (ref 0.0–0.5)
EOS%: 1.1 % (ref 0.0–7.0)
HCT: 39.6 % (ref 34.8–46.6)
HEMOGLOBIN: 13 g/dL (ref 11.6–15.9)
LYMPH%: 21.4 % (ref 14.0–49.7)
MCH: 30.2 pg (ref 25.1–34.0)
MCHC: 33 g/dL (ref 31.5–36.0)
MCV: 91.7 fL (ref 79.5–101.0)
MONO#: 0.5 10*3/uL (ref 0.1–0.9)
MONO%: 6.1 % (ref 0.0–14.0)
NEUT#: 5.3 10*3/uL (ref 1.5–6.5)
NEUT%: 70.9 % (ref 38.4–76.8)
PLATELETS: 193 10*3/uL (ref 145–400)
RBC: 4.31 10*6/uL (ref 3.70–5.45)
RDW: 14.3 % (ref 11.2–14.5)
WBC: 7.5 10*3/uL (ref 3.9–10.3)
lymph#: 1.6 10*3/uL (ref 0.9–3.3)

## 2017-07-22 NOTE — Telephone Encounter (Signed)
Gave patient avs and calendars for Jan and Aug of 2019.

## 2017-07-22 NOTE — Progress Notes (Signed)
[facility] FOLLOW UP NOTE  Patient Care Team: [name], MD as PCP - Geoffry Paradise, MD (Physical Medicine and Rehabilitation) Hillary Bow, MD (Cardiology) Newton Pigg, MD (Obstetrics and Gynecology)  CHIEF COMPLAINTS Follow up anemia and MGUS   INITIAL PRESENTATION (first consult):  [name] [name] 76 y.o. female is being referred by Dr. Deatra Ina because of anemia.   She had lumbar and thoracic spine fusion surgery for kyphosis in Feb 2015 and Sep 2015, and required blood transfusion 2u each time after surgery. She was discharged to rehab after surgery, and she received 2u RBC in Nov 4th, 2015 for Hb 7.8g/dl. She was fatigued when her Hg was low, but still able to do some rehab. She had multiple full in the past few month and was hospitalized last month. She still has diffuse body pain from the recent fall, quite fatigued, is only able to walk a short distance, (+) dyspnea and chest heaviness on exertion. No nausea, no change of her bowel habits. Her appetite is moderate to low, she lost about 15-20lbs in the past year.  She denied any bleeding episodes including hematochezia, melana, hemoptysis, hematuria or epitaxis. No mucosal bleeding or easy bruising. She was finally discharged home on 10/30/14, still getting home PT/OT. She came in today with a wheelchair.   Per her daughter, she has had intermittent confusion and anxiety lately, she seems to have some cognitive function dificiency also. Pt her self contributes to some of her medications.   She has last colonoscopy about 3 years ago, she probably had polyps removed. Her last mammogram was one year ago which was normal. Per her daughter, she has a normal CT abdomen and pelvis about 5-6 month ago which was ordered for some abdominal symptoms. She has been on iron pill (1 daily) a few weeks ago.    CURRENT THERAPY: Observation    INTERVAL HISTORY  [name] returns for follow up. She is accompanied by her  daughter to clinic today. Pt reports that she has extreme fatigue that is exacerbated intermittently following taking a shower. Pt reports that she is still having upper back pain and her lower back pain has been mildly alleviated. Pt states that she has had 2 surgeries to her back (upper and lower) and that her doctor would like to have a 3rd cervical surgery completed at this time. Daughter states that the pt ambulates with a walker due to her gait instability s/p multiple back surgeries and residual back pain. Denies new medical issues in the past 6 months. Denies needing a refill of her medications today. Daughter reports that the pt memory loss and confusion has been gradually worsening.   On review of systems, pt denies fever, chills, weight loss, decreased appetite. Pt reports increased fatigue. Pt reports back pain. Denies pain. Pt denies abdominal pain, nausea, vomiting. Pt reports gradually worsening memory loss and confusion.    MEDICAL HISTORY:  Past Medical History  Diagnosis Date  . Obesity   . Atrial fibrillation    . CTS (carpal tunnel syndrome)   . Diarrhea   . IBS (irritable bowel syndrome)   . Diverticulitis of colon   . Gastritis   . Esophageal stricture, s/p dilation    . Personal history of colonic polyps 10/25/2011 & 12/02/11    not retrieved Dr Lyla Son & tubular adenomas  . Hiatal hernia   . GERD (gastroesophageal reflux disease)   . Obstructive sleep apnea   . Insomnia   .  HTN (hypertension)   . Hyperlipidemia   . Depression/anxiety    . DIASTOLIC HEART FAILURE, CHRONIC 04/04/2009    Qualifier: Diagnosis of  By: Mare Ferrari, RMA, Sherri      SURGICAL HISTORY: Past Surgical History:  Procedure Laterality Date  . BACK SURGERY    . BONE MARROW BIOPSY  11/2014  . CARDIAC CATHETERIZATION  12/27/2014   Procedure: INTRAVASCULAR PRESSURE WIRE/FFR STUDY;  Surgeon: Larey Dresser, MD;  Location: [facility] CATH LAB;  Service: Cardiovascular;;  . CARDIAC CATHETERIZATION N/A  11/25/2016   Procedure: Left Heart Cath and Coronary Angiography;  Surgeon: Sherren Mocha, MD;  Location: [facility] CV LAB;  Service: Cardiovascular;  Laterality: N/A;  . CARDIOVERSION N/A 02/13/2016   Procedure: CARDIOVERSION;  Surgeon: Josue Hector, MD;  Location: [facility] ENDOSCOPY;  Service: Cardiovascular;  Laterality: N/A;  . CARPAL TUNNEL RELEASE Right 1980's  . CATARACT EXTRACTION Bilateral   . CATARACT EXTRACTION W/ INTRAOCULAR LENS  IMPLANT, BILATERAL  2000's  . CORONARY ANGIOPLASTY WITH STENT PLACEMENT  01/07/2015   "2"  . CORONARY STENT PLACEMENT    . DILATION AND CURETTAGE OF UTERUS    . ESOPHAGOGASTRODUODENOSCOPY (EGD) WITH ESOPHAGEAL DILATION  "several times"  . JOINT REPLACEMENT    . KNEE ARTHROSCOPY Left 1995  . LAPAROSCOPIC CHOLECYSTECTOMY  2003  . LEFT HEART CATHETERIZATION WITH CORONARY ANGIOGRAM N/A 12/27/2014   Procedure: LEFT HEART CATHETERIZATION WITH CORONARY ANGIOGRAM;  Surgeon: Larey Dresser, MD;  Location: [facility] CATH LAB;  Service: Cardiovascular;  Laterality: N/A;  . PERCUTANEOUS CORONARY ROTOBLATOR INTERVENTION (PCI-R)  01/07/2015  . PERCUTANEOUS CORONARY ROTOBLATOR INTERVENTION (PCI-R) N/A 01/07/2015   Procedure: PERCUTANEOUS CORONARY ROTOBLATOR INTERVENTION (PCI-R);  Surgeon: Blane Ohara, MD;  Location: [facility] CATH LAB;  Service: Cardiovascular;  Laterality: N/A;  . POSTERIOR FUSION THORACIC SPINE  08/2015  . POSTERIOR LUMBAR FUSION  01/2015  . TOTAL KNEE ARTHROPLASTY Bilateral 1990's - 2000's    SOCIAL HISTORY: Social History   Social History  . Marital status: Widowed    Spouse name: N/A  . Number of children: 2  . Years of education: HS   Occupational History  . librarian- retired     retired   Social History Main Topics  . Smoking status: Never Smoker  . Smokeless tobacco: Never Used  . Alcohol use No     Comment: 01/07/2015 "glass of wine at Christmas, maybe"  . Drug use: No  . Sexual activity: No   Other Topics Concern  . Not on file    Social History Narrative   Patient is right handed   Patient drinks 1-2 sodas daily.   patlient lives alone.       FAMILY HISTORY: Family History  Problem Relation Age of Onset  . Coronary artery disease Father   . Peripheral vascular disease Father   . Coronary artery disease Mother   . Coronary artery disease Brother   . Colon cancer Sister at 78   . Emphysema Sister   . Sleep apnea Son   . Colon cancer Sister at 81     spread to her brain  No family history of blood disorders or other malignancy   ALLERGIES:  is allergic to buprenorphine hcl; cymbalta [duloxetine hcl]; morphine and related; oxycodone-acetaminophen; valium [diazepam]; statins; altace [ramipril]; floxin [ofloxacin]; hydromorphone; lovaza [omega-3-acid ethyl esters]; trilipix [choline fenofibrate]; and zetia [ezetimibe].  MEDICATIONS:  Current Outpatient Prescriptions  Medication Sig Dispense Refill  . aspirin EC 81 MG tablet Take 1 tablet (81 mg total) by mouth  daily. 90 tablet 3  . Calcium Citrate-Vitamin D (CALCIUM + D PO) Take 1 tablet by mouth daily.    . Cholecalciferol (VITAMIN D) 2000 units tablet Take 2,000 Units by mouth daily.    . clobetasol cream (TEMOVATE) 0.05 % Apply 1 application topically See admin instructions. Applies to vaginal area twice a week.    . diltiazem (CARDIZEM CD) 120 MG 24 hr capsule Take 1 capsule (120 mg total) by mouth 2 (two) times daily. 180 capsule 3  . diphenoxylate-atropine (LOMOTIL) 2.5-0.025 MG per tablet Take 1 tablet by mouth 4 (four) times daily as needed for diarrhea or loose stools.     Marland Kitchen ELIQUIS 5 MG TABS tablet Take 1 tablet (5 mg total) by mouth 2 (two) times daily. 180 tablet 3  . Eszopiclone 3 MG TABS Take 1 tablet (3 mg total) by mouth at bedtime. For insomnia,take immediately before bedtime. 90 tablet 1  . fentaNYL (DURAGESIC - DOSED MCG/HR) 50 MCG/HR Place 1 patch (50 mcg total) onto the skin every 3 (three) days. 10 patch 0  . FLUoxetine (PROZAC) 40 MG  capsule Take 1 capsule by mouth  daily for depression 90 capsule 3  . fluticasone (FLONASE) 50 MCG/ACT nasal spray Place 2 sprays into both nostrils daily. 48 g 3  . furosemide (LASIX) 40 MG tablet TAKE 1.5 TABLETS (60 MG TOTAL) BY MOUTH DAILY    . HYDROcodone-acetaminophen (NORCO) 10-325 MG per tablet Take 1 tablet by mouth every 4 (four) hours as needed for moderate pain.     Marland Kitchen LORazepam (ATIVAN) 0.5 MG tablet Take 1 tablet (0.5 mg total) by mouth 2 (two) times daily as needed for anxiety. 60 tablet 1  . meclizine (ANTIVERT) 25 MG tablet Take 1 tablet (25 mg total) by mouth 3 (three) times daily as needed for dizziness. 60 tablet 0  . methocarbamol (ROBAXIN) 500 MG tablet Take 500 mg by mouth 4 (four) times daily as needed for muscle spasms.    . Multiple Vitamin (MULTIVITAMIN) tablet Take 1 tablet by mouth daily. For supplement    . nitroGLYCERIN (NITROSTAT) 0.4 MG SL tablet Place 1 tablet (0.4 mg total) under the tongue every 5 (five) minutes as needed for chest pain (up to 3 doses). 25 tablet 3  . pantoprazole (PROTONIX) 40 MG tablet Take 1 tablet (40 mg total) by mouth 2 (two) times daily. 180 tablet 3  . potassium chloride SA (K-DUR,KLOR-CON) 20 MEQ tablet Take 1 tablet (20 mEq total) by mouth daily. 90 tablet 3  . rosuvastatin (CRESTOR) 20 MG tablet Take 20 mg by mouth as directed. Take  20 mg alternate with 10 mg daily.     No current facility-administered medications for this visit.     REVIEW OF SYSTEMS:   Constitutional: Denies fevers, chills or abnormal night sweats (+) increased fatigue Eyes: Denies blurriness of vision, double vision or watery eyes Ears, nose, mouth, throat, and face: Denies mucositis or sore throat Respiratory: Denies cough, dyspnea or wheezes Cardiovascular: Denies palpitation, chest discomfort or lower extremity swelling Gastrointestinal:  Denies nausea, heartburn or change in bowel habits Skin: Denies abnormal skin rashes Lymphatics: Denies new  lymphadenopathy or easy bruising Neurological:Denies numbness, tingling or new weaknesses, (+) intermittent confusion,  Musculoskeletal: (+) chronic back pain  Behavioral/Psych: (+) anxiety, no new changes  All other systems were reviewed with the patient and are negative.  PHYSICAL EXAMINATION:  Vitals:   07/22/17 1531  BP: 121/60  Pulse: 100  Resp: 17   Filed Weights  07/22/17 1531  Weight: 197 lb 6.4 oz (89.5 kg)    GENERAL:alert, no distress and comfortable, sitting in wheelchair  SKIN: skin color, texture, turgor are normal, no rashes or significant lesions EYES: normal, conjunctiva are pink and non-injected, sclera clear OROPHARYNX:no exudate, no erythema and lips, buccal mucosa, and tongue normal  NECK: supple, thyroid normal size, non-tender, without nodularity LYMPH:  no palpable lymphadenopathy in the cervical, axillary or inguinal LUNGS: clear to auscultation and percussion with normal breathing effort HEART: regular rate & rhythm and no murmurs and no lower extremity edema ABDOMEN:abdomen soft, non-tender and normal bowel sounds Musculoskeletal:no cyanosis of digits and no clubbing. (+) surgical scar at her thoracic and lumbar spine  PSYCH: alert & oriented x 3 with fluent speech, but argues with her daughter a lot  NEURO: no focal motor/sensory deficits  LABORATORY DATA:  CBC Latest Ref Rng & Units 07/22/2017 05/10/2017 01/01/2017  WBC 3.9 - 10.3 10e3/uL 7.5 8.1 5.0  Hemoglobin 11.6 - 15.9 g/dL 13.0 12.5 13.4  Hematocrit 34.8 - 46.6 % 39.6 38.7 40.6  Platelets 145 - 400 10e3/uL 193 213 187   CMP Latest Ref Rng & Units 07/22/2017 07/07/2017 06/01/2017  Glucose 70 - 140 mg/dl 106 - -  BUN 7.0 - 26.0 mg/dL 10.2 - -  Creatinine 0.6 - 1.1 mg/dL 0.8 - -  Sodium 136 - 145 mEq/L 140 - -  Potassium 3.5 - 5.1 mEq/L 3.9 - -  Chloride 96 - 106 mmol/L - - -  CO2 22 - 29 mEq/L 30(H) - -  Calcium 8.4 - 10.4 mg/dL 9.3 - -  Total Protein 6.4 - 8.3 g/dL 6.5 6.5 6.2  Total  Bilirubin 0.20 - 1.20 mg/dL 0.52 0.3 0.4  Alkaline Phos 40 - 150 U/L 142 175(H) 142(H)  AST 5 - 34 U/L _0 ALT 0 - 55 U/L _1 SURGICAL PATH 12/07/14 Bone Marrow, Aspirate,Biopsy, and Clot, right post iliac - HYPERCELLULAR BONE MARROW FOR AGE WITH TRILINEAGE HEMATOPOIESIS AND 5% PLASMA CELLS. - SEE COMMENT. PERIPHERAL BLOOD: - NORMOCYTIC-NORMOCHROMIC ANEMIA. Diagnosis Note The bone marrow is hypercellular for age with trilineage hematopoiesis and essentially orderly and progressive maturation of all myeloid cell types. The plasma cells represent 5% of all cells associated with occasional small clusters. Immunohistochemical stains apparently show polyclonal staining pattern in plasma cells for kappa and lambda light chains. The appearance is non specific and not diagnostic of plasma cell dyscrasia/neoplasm. Correlation  Cytogenetics: normal    RADIOGRAPHIC STUDIES: I have personally reviewed the radiological images as listed and agreed with the findings in the report.  Ct Head Wo Contrast 10/27/2014   IMPRESSION: No acute intracranial abnormalities. Chronic atrophy and small vessel ischemic changes.     Ct Cervical Spine Wo Contrast 10/27/2014 IMPRESSION:  1. No evidence of acute fracture or malalignment.  2. Multilevel cervical spondylosis and facet arthropathy without focality.  3. Atherosclerotic vascular calcifications in both carotid arteries.      ASSESSMENT & PLAN:  Ms Troup is a 76 y.o.  female with multiple comorbidities, who presents with moderate persistent anemia after her spinal fusion surgery which required blood transfusion 3 times.  1. Anemia of iron deficiency -I previously reviewed her lab result with her and  Her daughter. No lab evidence of megaloblastic anemia, or hemolysis.   -She has normal iron level and ferritin, but low iron saturation and elevated soluble transferrin receptor, and history of very low ferritin in the past (<10),  which  support IDA. Her bone marrow biopsy also showed low iron staining. -Anemia has resolved. Her CBC is normal today. Repeated ferritin and iron study today are normal. I suggest her continue to take by mouth niferex 1 tab daily.  -We'll continue monitor her CBC periodically. -07/22/17 Hgb 13, results reviewed with pt and family today  2. MGUS, IgG  -SPEP showed low level M-protein (0.3), immunofixation was consistent with IgG lambda protein.  Quantitative immunoglobulin level and light chain levels were grossly normal. -I reviewed her bone marrow biopsy results, which showed 5% plasma cell, no monoclonal light chain deposition. -she has MGUS. No evidence of multiple myeloma. -Her CBC and BMP are normal, I think her chronic back and leg pain are most related to her arthritis, no clinical concern for disease progression to multiple myeloma -Her SPEP results is still pending from today, M protein was 0.3 in 06/2016 and negative in 12/2016. I'll call her next week with the result.  3. Dementia, anxiety -She could not tolerate Aricept. Follow up with neurologist.  4. HTN, AF, dyslipidemia  -follow up with PCP and cardiologist  5. Back, right hip and thigh pain -I strongly encouraged her to follow up with her orthopedic surgeon  6. Fatigue -Probably related to her sedentary lifestyle and multiple comorbidities, I strongly encouraged her to consider physical therapy   Plan -follow up for labs in 6 months -follow up in the office in 1 year with lab   All questions were answered. The patient knows to call the clinic with any problems, questions or concerns.  I spent 15 minutes counseling the patient face to face. The total time spent in the appointment was 20 minutes and more than 50% was on counseling.  This document serves as a record of services personally performed by Truitt Merle, MD. It was created on her behalf by Steva Colder, a trained medical scribe. The creation of this record is based on the  scribe's personal observations and the provider's statements to them. This document has been checked and approved by the attending provider.     Truitt Merle, MD 07/22/2017

## 2017-07-23 LAB — IRON AND TIBC
%SAT: 22 % (ref 21–57)
Iron: 64 ug/dL (ref 41–142)
TIBC: 298 ug/dL (ref 236–444)
UIBC: 233 ug/dL (ref 120–384)

## 2017-07-23 LAB — FERRITIN: FERRITIN: 96 ng/mL (ref 9–269)

## 2017-07-23 LAB — KAPPA/LAMBDA LIGHT CHAINS
Ig Kappa Free Light Chain: 27.1 mg/L — ABNORMAL HIGH (ref 3.3–19.4)
Ig Lambda Free Light Chain: 28.4 mg/L — ABNORMAL HIGH (ref 5.7–26.3)
Kappa/Lambda FluidC Ratio: 0.95 (ref 0.26–1.65)

## 2017-08-17 ENCOUNTER — Other Ambulatory Visit: Payer: Self-pay | Admitting: *Deleted

## 2017-08-17 ENCOUNTER — Telehealth: Payer: Self-pay | Admitting: Family Medicine

## 2017-08-17 DIAGNOSIS — I482 Chronic atrial fibrillation, unspecified: Secondary | ICD-10-CM

## 2017-08-17 DIAGNOSIS — I5032 Chronic diastolic (congestive) heart failure: Secondary | ICD-10-CM

## 2017-08-17 MED ORDER — ESZOPICLONE 3 MG PO TABS: 3.0000 mg | ORAL_TABLET | Freq: Every day | ORAL | 1 refills | Status: DC

## 2017-08-17 MED ORDER — DILTIAZEM HCL ER COATED BEADS 120 MG PO CP24: 120.0000 mg | ORAL_CAPSULE | Freq: Two times a day (BID) | ORAL | 2 refills | Status: DC

## 2017-08-17 NOTE — Telephone Encounter (Signed)
Pt aware to pick up rx 

## 2017-08-18 ENCOUNTER — Other Ambulatory Visit: Payer: Medicare HMO

## 2017-08-18 DIAGNOSIS — R748 Abnormal levels of other serum enzymes: Secondary | ICD-10-CM

## 2017-08-19 LAB — HEPATIC FUNCTION PANEL
ALT: 50 [IU]/L — ABNORMAL HIGH (ref 0–32)
AST: 35 [IU]/L (ref 0–40)
Albumin: 4 g/dL (ref 3.5–4.8)
Alkaline Phosphatase: 167 [IU]/L — ABNORMAL HIGH (ref 39–117)
Bilirubin Total: 0.5 mg/dL (ref 0.0–1.2)
Bilirubin, Direct: 0.16 mg/dL (ref 0.00–0.40)
Total Protein: 6.2 g/dL (ref 6.0–8.5)

## 2017-09-14 ENCOUNTER — Ambulatory Visit: Payer: Medicare HMO | Admitting: Family Medicine

## 2017-09-16 ENCOUNTER — Encounter: Payer: Self-pay | Admitting: Family Medicine

## 2017-09-17 DIAGNOSIS — D131 Benign neoplasm of stomach: Secondary | ICD-10-CM | POA: Diagnosis not present

## 2017-09-17 DIAGNOSIS — Z7901 Long term (current) use of anticoagulants: Secondary | ICD-10-CM | POA: Diagnosis not present

## 2017-09-17 DIAGNOSIS — I4891 Unspecified atrial fibrillation: Secondary | ICD-10-CM | POA: Diagnosis not present

## 2017-09-17 DIAGNOSIS — K449 Diaphragmatic hernia without obstruction or gangrene: Secondary | ICD-10-CM | POA: Diagnosis not present

## 2017-09-17 DIAGNOSIS — R1314 Dysphagia, pharyngoesophageal phase: Secondary | ICD-10-CM | POA: Diagnosis not present

## 2017-09-17 DIAGNOSIS — K228 Other specified diseases of esophagus: Secondary | ICD-10-CM | POA: Diagnosis not present

## 2017-09-17 DIAGNOSIS — R1310 Dysphagia, unspecified: Secondary | ICD-10-CM | POA: Diagnosis not present

## 2017-09-17 DIAGNOSIS — K317 Polyp of stomach and duodenum: Secondary | ICD-10-CM | POA: Diagnosis not present

## 2017-09-17 DIAGNOSIS — Z955 Presence of coronary angioplasty implant and graft: Secondary | ICD-10-CM | POA: Diagnosis not present

## 2017-09-17 DIAGNOSIS — K635 Polyp of colon: Secondary | ICD-10-CM | POA: Diagnosis not present

## 2017-10-06 ENCOUNTER — Ambulatory Visit (INDEPENDENT_AMBULATORY_CARE_PROVIDER_SITE_OTHER): Payer: Medicare HMO | Admitting: Family Medicine

## 2017-10-06 ENCOUNTER — Encounter: Payer: Self-pay | Admitting: Family Medicine

## 2017-10-06 VITALS — BP 105/73 | HR 108 | Temp 99.6°F | Ht 67.0 in | Wt 197.0 lb

## 2017-10-06 DIAGNOSIS — D509 Iron deficiency anemia, unspecified: Secondary | ICD-10-CM

## 2017-10-06 DIAGNOSIS — I480 Paroxysmal atrial fibrillation: Secondary | ICD-10-CM

## 2017-10-06 DIAGNOSIS — E7800 Pure hypercholesterolemia, unspecified: Secondary | ICD-10-CM

## 2017-10-06 DIAGNOSIS — E559 Vitamin D deficiency, unspecified: Secondary | ICD-10-CM | POA: Diagnosis not present

## 2017-10-06 DIAGNOSIS — G8929 Other chronic pain: Secondary | ICD-10-CM

## 2017-10-06 DIAGNOSIS — R6889 Other general symptoms and signs: Secondary | ICD-10-CM | POA: Diagnosis not present

## 2017-10-06 DIAGNOSIS — Z23 Encounter for immunization: Secondary | ICD-10-CM | POA: Diagnosis not present

## 2017-10-06 DIAGNOSIS — H6121 Impacted cerumen, right ear: Secondary | ICD-10-CM

## 2017-10-06 DIAGNOSIS — I10 Essential (primary) hypertension: Secondary | ICD-10-CM | POA: Diagnosis not present

## 2017-10-06 DIAGNOSIS — K219 Gastro-esophageal reflux disease without esophagitis: Secondary | ICD-10-CM | POA: Diagnosis not present

## 2017-10-06 DIAGNOSIS — M549 Dorsalgia, unspecified: Secondary | ICD-10-CM | POA: Diagnosis not present

## 2017-10-06 DIAGNOSIS — R0602 Shortness of breath: Secondary | ICD-10-CM | POA: Diagnosis not present

## 2017-10-06 MED ORDER — NITROGLYCERIN 0.4 MG SL SUBL: 0.4000 mg | SUBLINGUAL_TABLET | SUBLINGUAL | 3 refills | Status: DC | PRN

## 2017-10-06 NOTE — Patient Instructions (Addendum)
Continue to follow-up with cardiology and neurosurgery The flu shot that you received today may make your arm sore We will call you with the results the lab work as soon as these results become available We will irrigate the cerumen from the right ear canal and in the future use of Debrox drops may help soften the earwax Also continue to follow-up with gastroenterologist as planned

## 2017-10-06 NOTE — Progress Notes (Signed)
Subjective:    Patient ID: [name] [name], female    DOB: 1941-11-07, 76 y.o.   MRN: [id number]  HPI Pt here for follow up and management of chronic medical problems which includes a fib, hypertension and hyperlipidemia. She is taking medication regularly. Patient is doing well overall with no new complaints. She does have ongoing shortness of breath. She is due to return an FOBT will be given one today. She will get lab work today and will get her flu shot today. She is requesting a refill on her nitroglycerin. This patient has many ongoing problems and complaints including iron deficiency anemia atherosclerotic heart disease hyperlipidemia chronic atrial fibrillation and chronic diastolic heart failure. She is followed regularly by the cardiologist. She did have a chest CT 1 year ago because of an abnormal chest x-ray and the abnormality that was seen on the chest x-ray per CT scan was due to a large hiatal hernia with adjacent atelectasis and scarring. With the CT scan that was done in December 2017 no suspicious pulmonary nodule or mass was noted. The patient continues to have neck and back pain. She denies any chest pain or shortness of breath anymore than usual. She continues to have problems with reflux and does have her bed elevated slightly. She denies any blood in the stool or black tarry bowel movements. She is currently being followed by the cardiologist in [place]. She is also being followed by gastroenterologist with a return appointment soon to evaluate the swallowing issues that she is having currently. With her neck and back issues she does have a follow-up visit scheduled soon also with the neurosurgeons in [place]. She is also been told that she has to follow-up with the cardiologist in [place] soon as she is waiting for an appointment with them. She denies any burning with passing her water.     Patient Active Problem List   Diagnosis Date Noted  . Abnormal nuclear  cardiac imaging test   . Chronic diastolic CHF (congestive heart failure) ([place]) 07/17/2015  . Anemia, iron deficiency 07/02/2015  . DDD (degenerative disc disease), lumbar 03/22/2015  . Fall 03/22/2015  . 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 Difficulty hearing 03/22/2015  . Adaptive colitis 03/22/2015  . Lichen 28/00/3491  . Lumbar scoliosis 03/22/2015  . Fracture of pelvis ([place]) 03/22/2015  . Herpes zona 03/22/2015  . History of other specified conditions presenting hazards to health 03/22/2015  . Anemia 01/08/2015  . MGUS (monoclonal gammopathy of unknown significance) 01/08/2015  . Ischemic chest pain   . Permanent atrial fibrillation ([place]) 12/22/2014  . CAD (coronary artery disease) 12/22/2014  . Memory disorder 12/04/2014  . Deficiency anemia 11/10/2014  . Vertigo 10/27/2014  . Angulation of spine 09/18/2014  . Metabolic syndrome 79/15/0569  . HCAP (healthcare-associated pneumonia) 03/27/2014  . S/P spinal fusion 02/23/2014  . Chronic pain associated with significant psychosocial dysfunction 02/23/2014  . Preoperative evaluation of a medical condition to rule out surgical contraindications (TAR required) 10/09/2013  . Abdominal pain 07/06/2013  . Gastroesophageal hernia 07/06/2013  . Incontinence 10/13/2012  . History of IBS 01/26/2012  . 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 EDEMA 06/25/2010  . DEGENERATIVE JOINT DISEASE 04/08/2010  . OTHER DISORDERS OF NERVOUS SYSTEM&SENSE ORGANS 04/08/2010  . Obesity (BMI 30.0-34.9) 04/04/2009  . CIRCADIAN RHYTHM SLEEP D/O DELAY SLEEP PHSE TYPE 04/04/2009  . CARPAL TUNNEL SYNDROME 04/04/2009  . Diarrhea 01/17/2009  . IRRITABLE BOWEL SYNDROME 11/13/2008  . ESOPHAGEAL STRICTURE 11/12/2008  . GERD 11/12/2008  . DUODENITIS,  WITHOUT HEMORRHAGE 11/12/2008  . Diaphragmatic hernia without mention of obstruction or gangrene 11/12/2008  . DIVERTICULOSIS, COLON 11/12/2008  . COLONIC POLYPS, HX OF 11/12/2008  . Obstructive sleep apnea  05/23/2008  . DYSPNEA 05/23/2008  . Hyperlipidemia 05/22/2008  . Depression 05/22/2008  . Essential hypertension 05/22/2008  . INSOMNIA 05/22/2008   Outpatient Encounter Prescriptions as of 10/06/2017  Medication Sig  . aspirin EC 81 MG tablet Take 1 tablet (81 mg total) by mouth daily.  . Calcium Citrate-Vitamin D (CALCIUM + D PO) Take 1 tablet by mouth daily.  . Cholecalciferol (VITAMIN D) 2000 units tablet Take 2,000 Units by mouth daily.  . clobetasol cream (TEMOVATE) 4.65 % Apply 1 application topically See admin instructions. Applies to vaginal area twice a week.  . diltiazem (CARDIZEM CD) 120 MG 24 hr capsule Take 1 capsule (120 mg total) by mouth 2 (two) times daily.  . diphenoxylate-atropine (LOMOTIL) 2.5-0.025 MG per tablet Take 1 tablet by mouth 4 (four) times daily as needed for diarrhea or loose stools.   Marland Kitchen ELIQUIS 5 MG TABS tablet Take 1 tablet (5 mg total) by mouth 2 (two) times daily.  . Eszopiclone 3 MG TABS Take 1 tablet (3 mg total) by mouth at bedtime. For insomnia,take immediately before bedtime.  . fentaNYL (DURAGESIC - DOSED MCG/HR) 50 MCG/HR Place 1 patch (50 mcg total) onto the skin every 3 (three) days.  Marland Kitchen FLUoxetine (PROZAC) 40 MG capsule Take 1 capsule by mouth  daily for depression  . fluticasone (FLONASE) 50 MCG/ACT nasal spray Place 2 sprays into both nostrils daily.  . furosemide (LASIX) 40 MG tablet TAKE 1.5 TABLETS (60 MG TOTAL) BY MOUTH DAILY  . HYDROcodone-acetaminophen (NORCO) 10-325 MG per tablet Take 1 tablet by mouth every 4 (four) hours as needed for moderate pain.   Marland Kitchen LORazepam (ATIVAN) 0.5 MG tablet Take 1 tablet (0.5 mg total) by mouth 2 (two) times daily as needed for anxiety.  . meclizine (ANTIVERT) 25 MG tablet Take 1 tablet (25 mg total) by mouth 3 (three) times daily as needed for dizziness.  . methocarbamol (ROBAXIN) 500 MG tablet Take 500 mg by mouth 4 (four) times daily as needed for muscle spasms.  . Multiple Vitamin (MULTIVITAMIN) tablet  Take 1 tablet by mouth daily. For supplement  . pantoprazole (PROTONIX) 40 MG tablet Take 1 tablet (40 mg total) by mouth 2 (two) times daily.  . potassium chloride SA (K-DUR,KLOR-CON) 20 MEQ tablet Take 1 tablet (20 mEq total) by mouth daily.  . rosuvastatin (CRESTOR) 20 MG tablet Take 20 mg by mouth as directed. Take  20 mg alternate with 10 mg daily.  . nitroGLYCERIN (NITROSTAT) 0.4 MG SL tablet Place 1 tablet (0.4 mg total) under the tongue every 5 (five) minutes as needed for chest pain (up to 3 doses). (Patient not taking: Reported on 10/06/2017)   No facility-administered encounter medications on file as of 10/06/2017.      Review of Systems  Constitutional: Negative.   HENT: Negative.   Eyes: Negative.   Respiratory: Positive for shortness of breath.   Cardiovascular: Negative.   Gastrointestinal: Negative.   Endocrine: Negative.   Genitourinary: Negative.   Musculoskeletal: Negative.   Skin: Negative.   Allergic/Immunologic: Negative.   Neurological: Negative.   Hematological: Negative.   Psychiatric/Behavioral: Negative.        Objective:   Physical Exam  Constitutional: She is oriented to person, place, and time. She appears well-developed and well-nourished. No distress.  HENT:  Head: Normocephalic  and atraumatic.  Left Ear: External ear normal.  Nose: Nose normal.  Mouth/Throat: Oropharynx is clear and moist.  Ears cerumen right ear canal  Eyes: Pupils are equal, round, and reactive to light. Conjunctivae and EOM are normal. Right eye exhibits no discharge. Left eye exhibits no discharge. No scleral icterus.  Neck: Normal range of motion. Neck supple. No thyromegaly present.  Limited mobility without thyromegaly or anterior cervical adenopathy  Cardiovascular: Normal rate, normal heart sounds and intact distal pulses.   No murmur heard. Heart is irregular irregular at 96/m  Pulmonary/Chest: Effort normal and breath sounds normal. No respiratory distress. She has  no wheezes. She has no rales.  Clear anteriorly and posteriorly  Abdominal: Soft. Bowel sounds are normal. She exhibits no mass. There is tenderness. There is no rebound and no guarding.  Epigastric tenderness without liver or spleen enlargement or masses or bruits  Musculoskeletal: She exhibits tenderness. She exhibits no edema.  The patient has limited range of motion due to multiple back surgeries and ongoing chronic back pain and neck pain.  Lymphadenopathy:    She has no cervical adenopathy.  Neurological: She is alert and oriented to person, place, and time. She has normal reflexes. No cranial nerve deficit.  Skin: Skin is warm and dry. No rash noted.  Psychiatric: She has a normal mood and affect. Her behavior is normal. Judgment and thought content normal.  Nursing note and vitals reviewed.  BP 105/73 (BP Location: Left Arm)   Pulse (!) 108   Temp 99.6 F (37.6 C) (Oral)   Ht 5' 7"  (1.702 m)   Wt 197 lb (89.4 kg)   SpO2 91% Comment: 91 - 93 % on room air  BMI 30.85 kg/m         Assessment & Plan:  1. Paroxysmal atrial fibrillation ([place]) -The patient will continue to follow-up with her cardiologist, Dr. Burt Knack. She remains in atrial fibrillation at about 96/m.  2. Essential hypertension -The blood pressure is good today and she will continue with current treatment - BMP8+EGFR - Hepatic function panel  3. Pure hypercholesterolemia -Continue with current treatment pending results of lab work - Lipid panel  4. Vitamin D deficiency -Continue with current treatment pending results of lab work - VITAMIN D 25 Hydroxy (Vit-D Deficiency, Fractures)  5. Gastroesophageal reflux disease, esophagitis presence not specified -Follow-up with gastroenterologist at [facility] for difficulty with swallowing - Hepatic function panel  6. Iron deficiency anemia, unspecified iron deficiency anemia type -Follow-up with hematologist as planned. -  Anemia Profile B  7. SOB (shortness of breath) -This is an ongoing problem and is not any worse than usual but most likely related to inactivity. - Brain natriuretic peptide  8. Right ear impacted cerumen -Ear irrigation  9. Chronic back pain greater than 3 months duration -Follow-up with neurosurgery as planned  Meds ordered this encounter  Medications  . nitroGLYCERIN (NITROSTAT) 0.4 MG SL tablet    Sig: Place 1 tablet (0.4 mg total) under the tongue every 5 (five) minutes as needed for chest pain (up to 3 doses).    Dispense:  25 tablet    Refill:  3   Patient Instructions  Continue to follow-up with cardiology and neurosurgery The flu shot that you received today may make your arm sore We will call you with the results the lab work as soon as these results become available We will irrigate the cerumen from the right ear canal and in the  future use of Debrox drops may help soften the earwax Also continue to follow-up with gastroenterologist as planned  Arrie Senate MD

## 2017-10-07 ENCOUNTER — Other Ambulatory Visit: Payer: Self-pay

## 2017-10-07 DIAGNOSIS — R7989 Other specified abnormal findings of blood chemistry: Secondary | ICD-10-CM

## 2017-10-07 DIAGNOSIS — R945 Abnormal results of liver function studies: Principal | ICD-10-CM

## 2017-10-07 LAB — ANEMIA PROFILE B
BASOS ABS: 0 10*3/uL (ref 0.0–0.2)
Basos: 0 %
EOS (ABSOLUTE): 0.1 10*3/uL (ref 0.0–0.4)
EOS: 2 %
Ferritin: 105 ng/mL (ref 15–150)
Folate: >20 ng/mL (ref >3.0)
HEMOGLOBIN: 13.1 g/dL (ref 11.1–15.9)
Hematocrit: 39.5 % (ref 34.0–46.6)
IMMATURE GRANS (ABS): 0 10*3/uL (ref 0.0–0.1)
Immature Granulocytes: 0 %
Iron Saturation: 33 % (ref 15–55)
Iron: 98 ug/dL (ref 27–139)
LYMPHS ABS: 1.6 10*3/uL (ref 0.7–3.1)
Lymphs: 26 %
MCH: 31 pg (ref 26.6–33.0)
MCHC: 33.2 g/dL (ref 31.5–35.7)
MCV: 93 fL (ref 79–97)
MONOS ABS: 0.5 10*3/uL (ref 0.1–0.9)
Monocytes: 9 %
NEUTROS PCT: 63 %
Neutrophils Absolute: 3.8 10*3/uL (ref 1.4–7.0)
PLATELETS: 208 10*3/uL (ref 150–379)
RBC: 4.23 x10E6/uL (ref 3.77–5.28)
RDW: 13.9 % (ref 12.3–15.4)
RETIC CT PCT: 1.9 % (ref 0.6–2.6)
Total Iron Binding Capacity: 295 ug/dL (ref 250–450)
UIBC: 197 ug/dL (ref 118–369)
VITAMIN B 12: 1171 pg/mL (ref 232–1245)
WBC: 6 10*3/uL (ref 3.4–10.8)

## 2017-10-07 LAB — BMP8+EGFR
BUN / CREAT RATIO: 15 (ref 12–28)
BUN: 15 mg/dL (ref 8–27)
CHLORIDE: 95 mmol/L — AB (ref 96–106)
CO2: 26 mmol/L (ref 20–29)
Calcium: 9.6 mg/dL (ref 8.7–10.3)
Creatinine, Ser: 0.99 mg/dL (ref 0.57–1.00)
GFR calc Af Amer: 63 mL/min/{1.73_m2} (ref >59)
GFR calc non Af Amer: 55 mL/min/{1.73_m2} — ABNORMAL LOW (ref >59)
GLUCOSE: 115 mg/dL — AB (ref 65–99)
POTASSIUM: 3.8 mmol/L (ref 3.5–5.2)
SODIUM: 137 mmol/L (ref 134–144)

## 2017-10-07 LAB — LIPID PANEL
CHOLESTEROL TOTAL: 140 mg/dL (ref 100–199)
Chol/HDL Ratio: 2.2 ratio (ref 0.0–4.4)
HDL: 63 mg/dL (ref >39)
LDL Calculated: 48 mg/dL (ref 0–99)
TRIGLYCERIDES: 144 mg/dL (ref 0–149)
VLDL Cholesterol Cal: 29 mg/dL (ref 5–40)

## 2017-10-07 LAB — HEPATIC FUNCTION PANEL
ALBUMIN: 4.2 g/dL (ref 3.5–4.8)
ALK PHOS: 172 IU/L — AB (ref 39–117)
ALT: 61 IU/L — ABNORMAL HIGH (ref 0–32)
AST: 64 IU/L — ABNORMAL HIGH (ref 0–40)
BILIRUBIN TOTAL: 0.6 mg/dL (ref 0.0–1.2)
BILIRUBIN, DIRECT: 0.21 mg/dL (ref 0.00–0.40)
TOTAL PROTEIN: 6.4 g/dL (ref 6.0–8.5)

## 2017-10-07 LAB — BRAIN NATRIURETIC PEPTIDE: BNP: 165.8 pg/mL — AB (ref 0.0–100.0)

## 2017-10-07 LAB — VITAMIN D 25 HYDROXY (VIT D DEFICIENCY, FRACTURES): Vit D, 25-Hydroxy: 42.5 ng/mL (ref 30.0–100.0)

## 2017-10-14 DIAGNOSIS — M25551 Pain in right hip: Secondary | ICD-10-CM | POA: Diagnosis not present

## 2017-10-14 DIAGNOSIS — M961 Postlaminectomy syndrome, not elsewhere classified: Secondary | ICD-10-CM | POA: Diagnosis not present

## 2017-10-14 DIAGNOSIS — M5136 Other intervertebral disc degeneration, lumbar region: Secondary | ICD-10-CM | POA: Diagnosis not present

## 2017-10-14 DIAGNOSIS — G894 Chronic pain syndrome: Secondary | ICD-10-CM | POA: Diagnosis not present

## 2017-11-16 DIAGNOSIS — M40204 Unspecified kyphosis, thoracic region: Secondary | ICD-10-CM | POA: Diagnosis not present

## 2017-11-16 DIAGNOSIS — Z981 Arthrodesis status: Secondary | ICD-10-CM | POA: Diagnosis not present

## 2017-11-16 DIAGNOSIS — Z9889 Other specified postprocedural states: Secondary | ICD-10-CM | POA: Diagnosis not present

## 2017-11-17 ENCOUNTER — Other Ambulatory Visit: Payer: Medicare HMO

## 2017-11-17 DIAGNOSIS — R945 Abnormal results of liver function studies: Principal | ICD-10-CM

## 2017-11-17 DIAGNOSIS — R7989 Other specified abnormal findings of blood chemistry: Secondary | ICD-10-CM

## 2017-11-18 LAB — HEPATIC FUNCTION PANEL
ALBUMIN: 4.2 g/dL (ref 3.5–4.8)
ALT: 21 IU/L (ref 0–32)
AST: 25 IU/L (ref 0–40)
Alkaline Phosphatase: 124 IU/L — ABNORMAL HIGH (ref 39–117)
Bilirubin Total: 0.4 mg/dL (ref 0.0–1.2)
Bilirubin, Direct: 0.13 mg/dL (ref 0.00–0.40)
TOTAL PROTEIN: 6.5 g/dL (ref 6.0–8.5)

## 2017-12-06 DIAGNOSIS — L853 Xerosis cutis: Secondary | ICD-10-CM | POA: Diagnosis not present

## 2017-12-06 DIAGNOSIS — Z1231 Encounter for screening mammogram for malignant neoplasm of breast: Secondary | ICD-10-CM | POA: Diagnosis not present

## 2017-12-06 DIAGNOSIS — B353 Tinea pedis: Secondary | ICD-10-CM | POA: Diagnosis not present

## 2017-12-06 DIAGNOSIS — Z85828 Personal history of other malignant neoplasm of skin: Secondary | ICD-10-CM | POA: Diagnosis not present

## 2017-12-06 DIAGNOSIS — D225 Melanocytic nevi of trunk: Secondary | ICD-10-CM | POA: Diagnosis not present

## 2017-12-06 DIAGNOSIS — D1801 Hemangioma of skin and subcutaneous tissue: Secondary | ICD-10-CM | POA: Diagnosis not present

## 2017-12-06 DIAGNOSIS — L821 Other seborrheic keratosis: Secondary | ICD-10-CM | POA: Diagnosis not present

## 2017-12-06 DIAGNOSIS — L814 Other melanin hyperpigmentation: Secondary | ICD-10-CM | POA: Diagnosis not present

## 2017-12-06 LAB — HM MAMMOGRAPHY

## 2017-12-08 ENCOUNTER — Ambulatory Visit: Payer: Medicare HMO | Admitting: Cardiovascular Disease

## 2017-12-08 ENCOUNTER — Encounter: Payer: Self-pay | Admitting: Cardiovascular Disease

## 2017-12-08 VITALS — BP 110/74 | HR 96 | Ht 67.0 in | Wt 199.8 lb

## 2017-12-08 DIAGNOSIS — I10 Essential (primary) hypertension: Secondary | ICD-10-CM | POA: Diagnosis not present

## 2017-12-08 DIAGNOSIS — R0602 Shortness of breath: Secondary | ICD-10-CM

## 2017-12-08 DIAGNOSIS — I482 Chronic atrial fibrillation: Secondary | ICD-10-CM

## 2017-12-08 DIAGNOSIS — I4821 Permanent atrial fibrillation: Secondary | ICD-10-CM

## 2017-12-08 DIAGNOSIS — I5032 Chronic diastolic (congestive) heart failure: Secondary | ICD-10-CM | POA: Diagnosis not present

## 2017-12-08 DIAGNOSIS — M79604 Pain in right leg: Secondary | ICD-10-CM

## 2017-12-08 DIAGNOSIS — M79605 Pain in left leg: Secondary | ICD-10-CM | POA: Diagnosis not present

## 2017-12-08 MED ORDER — POTASSIUM CHLORIDE CRYS ER 20 MEQ PO TBCR: 20.0000 meq | EXTENDED_RELEASE_TABLET | Freq: Every day | ORAL | 3 refills | Status: DC

## 2017-12-08 NOTE — Progress Notes (Signed)
Cardiology Office Note Date:  12/08/2017   ID:  Afrika Brick Fraley, DOB [birth year], MRN [id number]  PCP:  [name], MD  Cardiologist:  Sherren Mocha, MD    Chief Complaint  Patient presents with  . Leg Pain     History of Present Illness: [name] [name] is a 76 y.o. female who presents for follow-up of atrial fibrillation, coronary artery disease, and chronic shortness of breath.  She has been fairly debilitated from multiple back surgeries.  She is now managed with chronic narcotics.  Her atrial fib is managed with a strategy of heart rate control and oral anticoagulation.  The patient is here with her daughter today.  She has not been feeling too well.  Continues to have shortness of breath with almost any activity.  She denies chest pain or pressure.  Complains of leg swelling.  No orthopnea or PND.  No heart palpitations.  She is very concerned about her feet swelling and redness and pain in her legs.  She is been maintained on long-term oral anticoagulation in the setting of permanent atrial fibrillation.  She wakes up at night sometimes with pain in her calves.  Past Medical History:  Diagnosis Date  . Anemia   . Anxiety   . Arthritis    "knees, back, fingers, toes; joints" (01/07/2015)  . CAD (coronary artery disease)    a. Abnl nuc 11/2014. Cath 12/2014 - turned down for CABG. Ultimately s/p TTVP, rotational atherectomy, PTCA and stenting of the ostial LCx and left main into the LAD (crush technique), and IVUS of the LAD/Left main. // b. Myoview 11/17: EF 48, poor quality/significant artifact; inf-lateral, inferior ischemia; Intermediate Risk  . Chronic atrial fibrillation ([place])    a.  First noted post-op 9/15 spinal fusion. She had cardioversion, not on anticoagulation. Fall risk, unsteady. // failed DCCV // Holter 10/17: AFib, Avg HR 97, PVCs, no other arrhythmia  . Chronic back pain greater than 3 months duration    a. spinal stenosis. Spinal fusion with rods in 2/15  at [facility] spinal fusion 9/15.  Marland Kitchen Chronic diastolic CHF (congestive heart failure) ([place])   . Circadian rhythm sleep disorder   . CTS (carpal tunnel syndrome)   . Depression   . Diarrhea   . Diverticulitis of colon   . Esophageal stricture   . Gastritis   . GERD (gastroesophageal reflux disease)   . Hiatal hernia   . History of blood transfusion    "most of them related to OR's"   . History of echocardiogram    a. Echo 2/17:  EF 50-55%, trivial AI, midl MR, mod LAE, PASP 37 mmHg  . HTN (hypertension)   . Hyperlipidemia   . IBS (irritable bowel syndrome)   . Insomnia   . Memory disorder 12/04/2014  . MGUS (monoclonal gammopathy of unknown significance) dx'd 11/2014   a. Neg BMB 11/2014.  . Multiple falls   . Obesity   . Obstructive sleep apnea    "have mask; don't wear it" (01/07/2015)  . Orthostasis   . PAT (paroxysmal atrial tachycardia) ([place])   . Personal history of colonic polyps 10/25/2011 & 12/02/11   not retrieved Dr Lyla Son & tubular adenomas  . Pneumonia 03/2014  . Sinus bradycardia    a. Baseline HR 50s-60s.  . Stroke [facility]) early 2000's   "small"; denies residual on 01/07/2015)    Past Surgical History:  Procedure Laterality Date  . BACK SURGERY    . BONE MARROW  BIOPSY  11/2014  . CARDIAC CATHETERIZATION  12/27/2014   Procedure: INTRAVASCULAR PRESSURE WIRE/FFR STUDY;  Surgeon: Larey Dresser, MD;  Location: [facility] CATH LAB;  Service: Cardiovascular;;  . CARDIAC CATHETERIZATION N/A 11/25/2016   Procedure: Left Heart Cath and Coronary Angiography;  Surgeon: Sherren Mocha, MD;  Location: [place] CV LAB;  Service: Cardiovascular;  Laterality: N/A;  . CARDIOVERSION N/A 02/13/2016   Procedure: CARDIOVERSION;  Surgeon: Josue Hector, MD;  Location: [facility] ENDOSCOPY;  Service: Cardiovascular;  Laterality: N/A;  . CARPAL TUNNEL RELEASE Right 1980's  . CATARACT EXTRACTION Bilateral   . CATARACT EXTRACTION W/ INTRAOCULAR LENS  IMPLANT, BILATERAL  2000's  .  CORONARY ANGIOPLASTY WITH STENT PLACEMENT  01/07/2015   "2"  . CORONARY STENT PLACEMENT    . DILATION AND CURETTAGE OF UTERUS    . ESOPHAGOGASTRODUODENOSCOPY (EGD) WITH ESOPHAGEAL DILATION  "several times"  . JOINT REPLACEMENT    . KNEE ARTHROSCOPY Left 1995  . LAPAROSCOPIC CHOLECYSTECTOMY  2003  . LEFT HEART CATHETERIZATION WITH CORONARY ANGIOGRAM N/A 12/27/2014   Procedure: LEFT HEART CATHETERIZATION WITH CORONARY ANGIOGRAM;  Surgeon: Larey Dresser, MD;  Location: [facility] CATH LAB;  Service: Cardiovascular;  Laterality: N/A;  . PERCUTANEOUS CORONARY ROTOBLATOR INTERVENTION (PCI-R)  01/07/2015  . PERCUTANEOUS CORONARY ROTOBLATOR INTERVENTION (PCI-R) N/A 01/07/2015   Procedure: PERCUTANEOUS CORONARY ROTOBLATOR INTERVENTION (PCI-R);  Surgeon: Blane Ohara, MD;  Location[facility] CATH LAB;  Service: Cardiovascular;  Laterality: N/A;  . POSTERIOR FUSION THORACIC SPINE  08/2015  . POSTERIOR LUMBAR FUSION  01/2015  . TOTAL KNEE ARTHROPLASTY Bilateral 1990's - 2000's    Current Outpatient Medications  Medication Sig Dispense Refill  . aspirin EC 81 MG tablet Take 1 tablet (81 mg total) by mouth daily. 90 tablet 3  . Calcium Citrate-Vitamin D (CALCIUM + D PO) Take 1 tablet by mouth daily.    . Cholecalciferol (VITAMIN D) 2000 units tablet Take 2,000 Units by mouth daily.    . clobetasol cream (TEMOVATE) 6.64 % Apply 1 application topically See admin instructions. Applies to vaginal area twice a week.    . diltiazem (CARDIZEM CD) 120 MG 24 hr capsule Take 1 capsule (120 mg total) by mouth 2 (two) times daily. 180 capsule 2  . diphenoxylate-atropine (LOMOTIL) 2.5-0.025 MG per tablet Take 1 tablet by mouth 4 (four) times daily as needed for diarrhea or loose stools.     Marland Kitchen ELIQUIS 5 MG TABS tablet Take 1 tablet (5 mg total) by mouth 2 (two) times daily. 180 tablet 3  . Eszopiclone 3 MG TABS Take 1 tablet (3 mg total) by mouth at bedtime. For insomnia,take immediately before bedtime. 90 tablet 1  . fentaNYL  (DURAGESIC - DOSED MCG/HR) 50 MCG/HR Place 1 patch (50 mcg total) onto the skin every 3 (three) days. 10 patch 0  . FLUoxetine (PROZAC) 40 MG capsule Take 1 capsule by mouth  daily for depression 90 capsule 3  . furosemide (LASIX) 40 MG tablet TAKE 1.5 TABLETS (60 MG TOTAL) BY MOUTH DAILY    . HYDROcodone-acetaminophen (NORCO) 10-325 MG per tablet Take 1 tablet by mouth every 4 (four) hours as needed for moderate pain.     Marland Kitchen LORazepam (ATIVAN) 0.5 MG tablet Take 1 tablet (0.5 mg total) by mouth 2 (two) times daily as needed for anxiety. 60 tablet 1  . meclizine (ANTIVERT) 25 MG tablet Take 1 tablet (25 mg total) by mouth 3 (three) times daily as needed for dizziness. 60 tablet 0  . methocarbamol (ROBAXIN)  500 MG tablet Take 500 mg by mouth 4 (four) times daily as needed for muscle spasms.    . Multiple Vitamin (MULTIVITAMIN) tablet Take 1 tablet by mouth daily. For supplement    . nitroGLYCERIN (NITROSTAT) 0.4 MG SL tablet Place 1 tablet (0.4 mg total) under the tongue every 5 (five) minutes as needed for chest pain (up to 3 doses). 25 tablet 3  . pantoprazole (PROTONIX) 40 MG tablet Take 1 tablet (40 mg total) by mouth 2 (two) times daily. 180 tablet 3  . potassium chloride SA (K-DUR,KLOR-CON) 20 MEQ tablet Take 1 tablet (20 mEq total) by mouth daily. 90 tablet 3  . rosuvastatin (CRESTOR) 10 MG tablet Take 10 mg by mouth as directed.    . rosuvastatin (CRESTOR) 5 MG tablet Take 5 mg by mouth as directed.     No current facility-administered medications for this visit.     Allergies:   Buprenorphine hcl; Cymbalta [duloxetine hcl]; Morphine and related; Oxycodone-acetaminophen; Valium [diazepam]; Statins; Altace [ramipril]; Floxin [ofloxacin]; Hydromorphone; Lovaza [omega-3-acid ethyl esters]; Trilipix [choline fenofibrate]; and Zetia [ezetimibe]   Social History:  The patient  reports that  has never smoked. she has never used smokeless tobacco. She reports that she does not drink alcohol or use  drugs.   Family History:  The patient's family history includes Colon cancer in her sister and sister; Coronary artery disease in her brother, father, and mother; Emphysema in her brother and sister; Peripheral vascular disease in her father; Sleep apnea in her son.    ROS:  Please see the history of present illness.  Otherwise, review of systems is positive for back pain, leg swelling, hearing loss, abdominal pain, diarrhea, muscle pain, easy bruising, leg pain, snoring, anxiety, balance problems.  All other systems are reviewed and negative.    PHYSICAL EXAM: VS:  BP 110/74   Pulse 96   Ht _0  (1.702 m)   Wt 199 lb 12.8 oz (90.6 kg)   SpO2 92%   BMI 31.29 kg/m  , BMI Body mass index is 31.29 kg/m. GEN: Well nourished, well developed, pleasant elderly woman in no acute distress  HEENT: normal  Neck: no JVD, no masses. No carotid bruits Cardiac: Irregularly irregular without murmur or gallop                Respiratory:  clear to auscultation bilaterally, normal work of breathing GI: soft, nontender, nondistended, + BS MS: no deformity or atrophy  Ext: no pretibial edema, pedal pulses 2+= bilaterally Skin: warm and dry, no rash.  The toes are red in both feet are warm.  There is nailbed thickening consistent with onychomycosis Neuro:  Strength and sensation are intact Psych: euthymic mood, full affect  EKG:  EKG is ordered today. The ekg ordered today shows atrial fibrillation 96 bpm, otherwise within normal limits.  Recent Labs: 10/06/2017: BNP 165.8; BUN 15; Creatinine, Ser 0.99; Hemoglobin 13.1; Platelets 208; Potassium 3.8; Sodium 137 11/17/2017: ALT 21   Lipid Panel     Component Value Date/Time   CHOL 140 10/06/2017 1222   CHOL 156 05/11/2013 1536   TRIG 144 10/06/2017 1222   TRIG 121 02/18/2016 1504   TRIG 239 (H) 05/11/2013 1536   HDL 63 10/06/2017 1222   HDL 57 02/18/2016 1504   HDL 43 05/11/2013 1536   CHOLHDL 2.2 10/06/2017 1222   LDLCALC 48 10/06/2017 1222    LDLCALC 50 08/21/2014 1141   LDLCALC 65 05/11/2013 1536      Wt  Readings from Last 3 Encounters:  12/08/17 199 lb 12.8 oz (90.6 kg)  10/06/17 197 lb (89.4 kg)  07/22/17 197 lb 6.4 oz (89.5 kg)     Cardiac Studies Reviewed: 2D Echo 01-29-2016: Study Conclusions  - Left ventricle: Systolic function was normal. The estimated   ejection fraction was in the range of 50% to 55%. - Aortic valve: There was trivial regurgitation. - Mitral valve: There was mild regurgitation. - Left atrium: The atrium was moderately dilated. - Atrial septum: No defect or patent foramen ovale was identified. - Pulmonary arteries: PA peak pressure: 37 mm Hg (S).  Cardiac Cath 11-25-2016: Conclusion   Continued patency of the distal left mainstem/proximal LAD/proximal circumflex bifurcation stents with mild ISR of the proximal circumflex  Mild calcific, nonobstructive CAD  Recommend: continued medical therapy for CAD, CHF, and atrial fibrillation  Indications   Abnormal nuclear cardiac imaging test [R93.1 (ICD-10-CM)]  Procedural Details/Technique   Technical Details INDICATION: Abnormal nuclear scan with inferolateral ischemia. Previous bifurcation stenting of the distal left main/LAD/LCx.  PROCEDURAL DETAILS: The right wrist was prepped, draped, and anesthetized with 1% lidocaine. Using the modified Seldinger technique, a 5/6 French Slender sheath was introduced into the right radial artery. 3 mg of verapamil was administered through the sheath, weight-based unfractionated heparin was administered intravenously. Standard Judkins catheters were used for selective coronary angiography and left ventricular pressure recording, Ao pullback done. Catheter exchanges were performed over an exchange length guidewire. There were no immediate procedural complications. A TR band was used for radial hemostasis at the completion of the procedure. The patient was transferred to the post catheterization recovery area  for further monitoring.   Estimated blood loss <50 mL.  During this procedure the patient was administered the following to achieve and maintain moderate conscious sedation: Versed 3 mg, Fentanyl 25 mcg, while the patient's heart rate, blood pressure, and oxygen saturation were continuously monitored. The period of conscious sedation was 34 minutes, of which I was present face-to-face 100% of this time.  Coronary Findings   Diagnostic  Dominance: Right  Left Main  Ost LM to LM lesion 0% stenosed  Previously placed Ost LM to LM drug eluting stent is widely patent. There is a stent extending from the midportion of the left mainstem into the proximal LAD. There is no significant stenosis throughout the stented segment.  Left Anterior Descending  The vessel exhibits minimal luminal irregularities.  Left Circumflex  The vessel exhibits minimal luminal irregularities.  Ost Cx to Prox Cx lesion 25% stenosed  The lesion was previously treated using a drug eluting stent between 1-2 years ago. The left circumflex stent was previously 'crushed' as part of the distal left main intervention. The stent is widely patent with mild 25% in-stent restenosis at the origin of the circumflex.  Right Coronary Artery  The vessel exhibits minimal luminal irregularities.  Mid RCA lesion 40% stenosed  The lesion is moderately calcified. nonobstructive calcified mid-RCA stenosis  Intervention   No interventions have been documented.  Coronary Diagrams   Diagnostic Diagram       Implants     No implant documentation for this [employer].  MERGE Images   Show images for Cardiac catheterization   Link to Procedure Log   Procedure Log    Hemo Data    Most Recent Value  AO Systolic Pressure 585 mmHg  AO Diastolic Pressure 55 mmHg  AO Mean 81 mmHg  LV Systolic Pressure 277 mmHg  LV Diastolic Pressure 3 mmHg  LV EDP 10 mmHg  Arterial Occlusion Pressure Extended Systolic Pressure 923 mmHg  Arterial  Occlusion Pressure Extended Diastolic Pressure 53 mmHg  Arterial Occlusion Pressure Extended Mean Pressure 81 mmHg  Left Ventricular Apex Extended Systolic Pressure 300 mmHg  Left Ventricular Apex Extended Diastolic Pressure 4 mmHg  Left Ventricular Apex Extended EDP Pressure 10 mmHg    ASSESSMENT AND PLAN: 1.  Permanent atrial fibrillation: We will continue with a strategy of heart rate control and anticoagulation.  She appears to be tolerating apixaban.  Would like to update her echocardiogram which was last done in February 2017.  2.  Chronic diastolic heart failure: Overall appears stable.  I do not appreciate much edema on her exam today.  JVP appears normal.  Will reassess with an echocardiogram.  She has New York Heart Association functional class III symptoms which I suspect are multifactorial.  She is severely deconditioned from her back problems.  3.  Coronary artery disease, native vessel, without angina: She is undergone complex left main stenting in the past.  Last catheterization one year ago was reviewed and demonstrated patency of her stent sites.  No changes are recommended today.  She remains on aspirin 81 mg daily.  4.  Hyperlipidemia: Continue Crestor on her current regimen.  5.  Leg pain: Peripheral pulse exam is essentially normal.  I can palpate DP and PT pulses bilaterally.  However they are very concerned about her leg pain.  Will check ABIs.  Current medicines are reviewed with the patient today.  The patient does not have concerns regarding medicines.  Labs/ tests ordered today include:   Orders Placed This Encounter  Procedures  . EKG 12-Lead  . ECHOCARDIOGRAM COMPLETE    Disposition:   FU 6 months  Signed, Sherren Mocha, MD  12/08/2017 5:42 PM    [facility] [address], [place], [place]  [address] Phone: [phone]; Fax: [phone]

## 2017-12-08 NOTE — Patient Instructions (Signed)
Medication Instructions:  Your provider recommends that you continue on your current medications as directed. Please refer to the Current Medication list given to you today.    Labwork: None  Testing/Procedures: Your provider has requested that you have an echocardiogram. Echocardiography is a painless test that uses sound waves to create images of your heart. It provides your doctor with information about the size and shape of your heart and how well your heart's chambers and valves are working. This procedure takes approximately one hour. There are no restrictions for this procedure.    Your physician has requested that you have an ankle brachial index (ABI). During this test an ultrasound and blood pressure cuff are used to evaluate the arteries that supply the arms and legs with blood. Allow thirty minutes for this exam. There are no restrictions or special instructions.   Follow-Up: Your provider wants you to follow-up in: 6 months with Dr. Burt Knack. You will receive a reminder letter in the mail two months in advance. If you don't receive a letter, please call our office to schedule the follow-up appointment.    Any Other Special Instructions Will Be Listed Below (If Applicable).     If you need a refill on your cardiac medications before your next appointment, please call your pharmacy.

## 2017-12-09 ENCOUNTER — Other Ambulatory Visit: Payer: Self-pay | Admitting: Cardiovascular Disease

## 2017-12-09 MED ORDER — FUROSEMIDE 40 MG PO TABS: ORAL_TABLET | ORAL | 3 refills | Status: DC

## 2017-12-16 ENCOUNTER — Other Ambulatory Visit: Payer: Self-pay | Admitting: Cardiovascular Disease

## 2017-12-16 DIAGNOSIS — I739 Peripheral vascular disease, unspecified: Secondary | ICD-10-CM

## 2017-12-17 ENCOUNTER — Encounter: Payer: Self-pay | Admitting: *Deleted

## 2017-12-22 ENCOUNTER — Other Ambulatory Visit: Payer: Self-pay

## 2017-12-22 ENCOUNTER — Ambulatory Visit (HOSPITAL_COMMUNITY): Payer: Medicare HMO | Attending: Cardiovascular Disease

## 2017-12-22 DIAGNOSIS — I4821 Permanent atrial fibrillation: Secondary | ICD-10-CM

## 2017-12-22 DIAGNOSIS — I5032 Chronic diastolic (congestive) heart failure: Secondary | ICD-10-CM

## 2017-12-22 DIAGNOSIS — I361 Nonrheumatic tricuspid (valve) insufficiency: Secondary | ICD-10-CM | POA: Diagnosis not present

## 2017-12-24 ENCOUNTER — Ambulatory Visit (HOSPITAL_COMMUNITY): Admission: RE | Admit: 2017-12-24 | Payer: Medicare HMO | Source: Ambulatory Visit

## 2017-12-24 ENCOUNTER — Encounter (HOSPITAL_COMMUNITY): Payer: Medicare HMO

## 2017-12-27 ENCOUNTER — Other Ambulatory Visit (HOSPITAL_COMMUNITY): Payer: Medicare HMO

## 2018-01-17 DIAGNOSIS — H40013 Open angle with borderline findings, low risk, bilateral: Secondary | ICD-10-CM | POA: Diagnosis not present

## 2018-01-18 ENCOUNTER — Ambulatory Visit (HOSPITAL_COMMUNITY)
Admission: RE | Admit: 2018-01-18 | Discharge: 2018-01-18 | Disposition: A | Discharge status: Home / Self Care | Payer: Medicare HMO | Source: Ambulatory Visit | Attending: Internal Medicine | Admitting: Internal Medicine

## 2018-01-18 DIAGNOSIS — I739 Peripheral vascular disease, unspecified: Secondary | ICD-10-CM

## 2018-01-18 DIAGNOSIS — I10 Essential (primary) hypertension: Secondary | ICD-10-CM | POA: Insufficient documentation

## 2018-01-18 DIAGNOSIS — M79606 Pain in leg, unspecified: Secondary | ICD-10-CM | POA: Insufficient documentation

## 2018-01-18 DIAGNOSIS — E785 Hyperlipidemia, unspecified: Secondary | ICD-10-CM | POA: Diagnosis not present

## 2018-01-24 ENCOUNTER — Inpatient Hospital Stay: Payer: Medicare HMO | Attending: Hematology

## 2018-01-24 DIAGNOSIS — D509 Iron deficiency anemia, unspecified: Secondary | ICD-10-CM | POA: Diagnosis not present

## 2018-01-24 DIAGNOSIS — D472 Monoclonal gammopathy: Secondary | ICD-10-CM

## 2018-01-24 LAB — CBC WITH DIFFERENTIAL/PLATELET
BASOS ABS: 0 10*3/uL (ref 0.0–0.1)
BASOS PCT: 0 %
Eosinophils Absolute: 0.2 10*3/uL (ref 0.0–0.5)
Eosinophils Relative: 3 %
HEMATOCRIT: 37.5 % (ref 34.8–46.6)
HEMOGLOBIN: 12 g/dL (ref 11.6–15.9)
LYMPHS PCT: 26 %
Lymphs Abs: 1.6 10*3/uL (ref 0.9–3.3)
MCH: 30.8 pg (ref 25.1–34.0)
MCHC: 32 g/dL (ref 31.5–36.0)
MCV: 96.4 fL (ref 79.5–101.0)
Monocytes Absolute: 0.3 10*3/uL (ref 0.1–0.9)
Monocytes Relative: 5 %
NEUTROS ABS: 4 10*3/uL (ref 1.5–6.5)
NEUTROS PCT: 66 %
Platelets: 195 10*3/uL (ref 145–400)
RBC: 3.89 MIL/uL (ref 3.70–5.45)
RDW: 14.1 % (ref 11.2–14.5)
WBC: 6.1 10*3/uL (ref 3.9–10.3)

## 2018-01-24 LAB — COMPREHENSIVE METABOLIC PANEL
ALT: 52 U/L (ref 0–55)
ANION GAP: 8 (ref 3–11)
AST: 46 U/L — ABNORMAL HIGH (ref 5–34)
Albumin: 3.3 g/dL — ABNORMAL LOW (ref 3.5–5.0)
Alkaline Phosphatase: 161 U/L — ABNORMAL HIGH (ref 40–150)
BUN: 14 mg/dL (ref 7–26)
CALCIUM: 9.2 mg/dL (ref 8.4–10.4)
CO2: 31 mmol/L — ABNORMAL HIGH (ref 22–29)
CREATININE: 0.86 mg/dL (ref 0.60–1.10)
Chloride: 101 mmol/L (ref 98–109)
Glucose, Bld: 115 mg/dL (ref 70–140)
Potassium: 4.2 mmol/L (ref 3.5–5.1)
Sodium: 140 mmol/L (ref 136–145)
Total Bilirubin: 0.4 mg/dL (ref 0.2–1.2)
Total Protein: 6.4 g/dL (ref 6.4–8.3)

## 2018-01-25 LAB — KAPPA/LAMBDA LIGHT CHAINS
KAPPA FREE LGHT CHN: 30.3 mg/L — AB (ref 3.3–19.4)
Kappa, lambda light chain ratio: 1.1 (ref 0.26–1.65)
Lambda free light chains: 27.6 mg/L — ABNORMAL HIGH (ref 5.7–26.3)

## 2018-01-25 LAB — IRON AND TIBC
IRON: 71 ug/dL (ref 41–142)
SATURATION RATIOS: 24 % (ref 21–57)
TIBC: 301 ug/dL (ref 236–444)
UIBC: 230 ug/dL

## 2018-01-25 LAB — FERRITIN: Ferritin: 78 ng/mL (ref 9–269)

## 2018-01-26 LAB — MULTIPLE MYELOMA PANEL, SERUM
ALBUMIN SERPL ELPH-MCNC: 3.3 g/dL (ref 2.9–4.4)
ALPHA 1: 0.3 g/dL (ref 0.0–0.4)
ALPHA2 GLOB SERPL ELPH-MCNC: 0.7 g/dL (ref 0.4–1.0)
Albumin/Glob SerPl: 1.3 (ref 0.7–1.7)
B-GLOBULIN SERPL ELPH-MCNC: 0.8 g/dL (ref 0.7–1.3)
Gamma Glob SerPl Elph-Mcnc: 0.8 g/dL (ref 0.4–1.8)
Globulin, Total: 2.6 g/dL (ref 2.2–3.9)
IGG (IMMUNOGLOBIN G), SERUM: 830 mg/dL (ref 700–1600)
IgA: 153 mg/dL (ref 64–422)
IgM (Immunoglobulin M), Srm: 91 mg/dL (ref 26–217)
M PROTEIN SERPL ELPH-MCNC: 0.2 g/dL — AB
TOTAL PROTEIN ELP: 5.9 g/dL — AB (ref 6.0–8.5)

## 2018-01-31 ENCOUNTER — Other Ambulatory Visit: Payer: Self-pay

## 2018-01-31 MED ORDER — FLUOXETINE HCL 40 MG PO CAPS: ORAL_CAPSULE | ORAL | 0 refills | Status: DC

## 2018-01-31 NOTE — Telephone Encounter (Signed)
Last seen 10/07/17  DWM

## 2018-02-07 ENCOUNTER — Other Ambulatory Visit: Payer: Self-pay

## 2018-02-07 DIAGNOSIS — I2510 Atherosclerotic heart disease of native coronary artery without angina pectoris: Secondary | ICD-10-CM

## 2018-02-07 DIAGNOSIS — I480 Paroxysmal atrial fibrillation: Secondary | ICD-10-CM

## 2018-02-07 MED ORDER — PANTOPRAZOLE SODIUM 40 MG PO TBEC: 40.0000 mg | DELAYED_RELEASE_TABLET | Freq: Two times a day (BID) | ORAL | 0 refills | Status: DC

## 2018-02-14 DIAGNOSIS — G894 Chronic pain syndrome: Secondary | ICD-10-CM | POA: Insufficient documentation

## 2018-02-14 DIAGNOSIS — Z79891 Long term (current) use of opiate analgesic: Secondary | ICD-10-CM | POA: Insufficient documentation

## 2018-02-14 DIAGNOSIS — M545 Low back pain: Secondary | ICD-10-CM | POA: Diagnosis not present

## 2018-02-15 ENCOUNTER — Other Ambulatory Visit: Payer: Self-pay | Admitting: *Deleted

## 2018-02-15 ENCOUNTER — Encounter: Payer: Self-pay | Admitting: Family Medicine

## 2018-02-15 ENCOUNTER — Ambulatory Visit: Payer: Medicare HMO | Admitting: Family Medicine

## 2018-02-15 VITALS — BP 100/69 | HR 81 | Temp 97.7°F | Ht 67.0 in | Wt 200.0 lb

## 2018-02-15 DIAGNOSIS — E7800 Pure hypercholesterolemia, unspecified: Secondary | ICD-10-CM

## 2018-02-15 DIAGNOSIS — M150 Primary generalized (osteo)arthritis: Secondary | ICD-10-CM

## 2018-02-15 DIAGNOSIS — D509 Iron deficiency anemia, unspecified: Secondary | ICD-10-CM | POA: Diagnosis not present

## 2018-02-15 DIAGNOSIS — I10 Essential (primary) hypertension: Secondary | ICD-10-CM

## 2018-02-15 DIAGNOSIS — I5032 Chronic diastolic (congestive) heart failure: Secondary | ICD-10-CM

## 2018-02-15 DIAGNOSIS — M8949 Other hypertrophic osteoarthropathy, multiple sites: Secondary | ICD-10-CM

## 2018-02-15 DIAGNOSIS — M549 Dorsalgia, unspecified: Secondary | ICD-10-CM

## 2018-02-15 DIAGNOSIS — I480 Paroxysmal atrial fibrillation: Secondary | ICD-10-CM | POA: Diagnosis not present

## 2018-02-15 DIAGNOSIS — I208 Other forms of angina pectoris: Secondary | ICD-10-CM

## 2018-02-15 DIAGNOSIS — K219 Gastro-esophageal reflux disease without esophagitis: Secondary | ICD-10-CM | POA: Diagnosis not present

## 2018-02-15 DIAGNOSIS — K589 Irritable bowel syndrome without diarrhea: Secondary | ICD-10-CM | POA: Diagnosis not present

## 2018-02-15 DIAGNOSIS — M159 Polyosteoarthritis, unspecified: Secondary | ICD-10-CM

## 2018-02-15 DIAGNOSIS — G8929 Other chronic pain: Secondary | ICD-10-CM

## 2018-02-15 DIAGNOSIS — E559 Vitamin D deficiency, unspecified: Secondary | ICD-10-CM | POA: Diagnosis not present

## 2018-02-15 MED ORDER — ESZOPICLONE 3 MG PO TABS: 3.0000 mg | ORAL_TABLET | Freq: Every day | ORAL | 1 refills | Status: DC

## 2018-02-15 NOTE — Progress Notes (Addendum)
Subjective:    Patient ID: [name] [name], female    DOB: [birth year], 77 y.o.   MRN: [id number]  HPI Pt here for follow up and management of chronic medical problems which includes hypertension, hyperlipidemia and a fib. She is taking medication regularly.  The patient has been followed regularly by her cardiologist, Dr. Burt Knack for chronic diastolic heart failure.  She was also scheduled for arterial studies of the left extremity but this was canceled by the patient.  She is followed for her ongoing back pain by neurosurgery.  She is also having problems with osteoarthritis in her knees.  She does have paroxysmal atrial fibrillation.  She has kyphosis and spinal fusion of T3 through T12 and this was done in September 2015.  She was instructed by the specialist to walk as much as she is able daily and to follow-up with them again in 6 months. She is on a blood thinner takes Lunesta 3 mg and takes medication for pain ongoing in her back along with pantoprazole and Crestor 10 mg for cholesterol.  She still has her right hip pain.  She is requesting a refill on her Lunesta.  The patient denies any chest pain.  She has shortness of breath on a daily basis and this is just from the stress of walking with her back pain and using the walker.  This is no more than usual.  She has irritable bowel syndrome and ongoing problems with constipation and loose bowel movements with occasional blood in the stool.  She had 2 siblings, sisters, that had colon cancer and she says that she is supposed to get a colonoscopy every 3 years.  She will check with the gastroenterologist about her need for this as this would have been done in December 2018.  She is passing her water without problems.  Continues to have her back problems and has trouble standing from a sitting position because of ongoing back pain and neuropathy associated with this.  Also, with osteoarthritis in both knees she has a lot of stiffness with arising and  mobility.   Patient Active Problem List   Diagnosis Date Noted  . Abnormal nuclear cardiac imaging test   . Chronic diastolic CHF (congestive heart failure) ([place]) 07/17/2015  . Anemia, iron deficiency 07/02/2015  . DDD (degenerative disc disease), lumbar 03/22/2015  . Fall 03/22/2015  . 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 Difficulty hearing 03/22/2015  . Adaptive colitis 03/22/2015  . Lichen 62/69/4854  . Lumbar scoliosis 03/22/2015  . Fracture of pelvis ([place]) 03/22/2015  . Herpes zona 03/22/2015  . History of other specified conditions presenting hazards to health 03/22/2015  . Anemia 01/08/2015  . MGUS (monoclonal gammopathy of unknown significance) 01/08/2015  . Ischemic chest pain   . Permanent atrial fibrillation ([place]) 12/22/2014  . CAD (coronary artery disease) 12/22/2014  . Memory disorder 12/04/2014  . Deficiency anemia 11/10/2014  . Vertigo 10/27/2014  . Angulation of spine 09/18/2014  . Metabolic syndrome 62/70/3500  . HCAP (healthcare-associated pneumonia) 03/27/2014  . S/P spinal fusion 02/23/2014  . Chronic pain associated with significant psychosocial dysfunction 02/23/2014  . Preoperative evaluation of a medical condition to rule out surgical contraindications (TAR required) 10/09/2013  . Abdominal pain 07/06/2013  . Gastroesophageal hernia 07/06/2013  . Incontinence 10/13/2012  . History of IBS 01/26/2012  . 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 EDEMA 06/25/2010  . DEGENERATIVE JOINT DISEASE 04/08/2010  . OTHER DISORDERS OF NERVOUS SYSTEM&SENSE ORGANS 04/08/2010  . Obesity (BMI 30.0-34.9) 04/04/2009  .  CIRCADIAN RHYTHM SLEEP D/O DELAY SLEEP PHSE TYPE 04/04/2009  . CARPAL TUNNEL SYNDROME 04/04/2009  . Diarrhea 01/17/2009  . IRRITABLE BOWEL SYNDROME 11/13/2008  . ESOPHAGEAL STRICTURE 11/12/2008  . GERD 11/12/2008  . DUODENITIS, WITHOUT HEMORRHAGE 11/12/2008  . Diaphragmatic hernia without mention of obstruction or gangrene 11/12/2008  . DIVERTICULOSIS,  COLON 11/12/2008  . COLONIC POLYPS, HX OF 11/12/2008  . Obstructive sleep apnea 05/23/2008  . DYSPNEA 05/23/2008  . Hyperlipidemia 05/22/2008  . Depression 05/22/2008  . Essential hypertension 05/22/2008  . INSOMNIA 05/22/2008   Outpatient Encounter Medications as of 02/15/2018  Medication Sig  . aspirin EC 81 MG tablet Take 1 tablet (81 mg total) by mouth daily.  . Calcium Citrate-Vitamin D (CALCIUM + D PO) Take 1 tablet by mouth daily.  . Cholecalciferol (VITAMIN D) 2000 units tablet Take 2,000 Units by mouth daily.  . clobetasol cream (TEMOVATE) 7.42 % Apply 1 application topically See admin instructions. Applies to vaginal area twice a week.  . diltiazem (CARDIZEM CD) 120 MG 24 hr capsule Take 1 capsule (120 mg total) by mouth 2 (two) times daily.  . diphenoxylate-atropine (LOMOTIL) 2.5-0.025 MG per tablet Take 1 tablet by mouth 4 (four) times daily as needed for diarrhea or loose stools.   Marland Kitchen ELIQUIS 5 MG TABS tablet Take 1 tablet (5 mg total) by mouth 2 (two) times daily.  . Eszopiclone 3 MG TABS Take 1 tablet (3 mg total) by mouth at bedtime. For insomnia,take immediately before bedtime.  . fentaNYL (DURAGESIC - DOSED MCG/HR) 50 MCG/HR Place 1 patch (50 mcg total) onto the skin every 3 (three) days.  Marland Kitchen FLUoxetine (PROZAC) 40 MG capsule Take 1 capsule by mouth  daily for depression  . furosemide (LASIX) 40 MG tablet TAKE 1.5 TABLETS (60 MG TOTAL) BY MOUTH DAILY  . HYDROcodone-acetaminophen (NORCO) 10-325 MG per tablet Take 1 tablet by mouth every 4 (four) hours as needed for moderate pain.   Marland Kitchen LORazepam (ATIVAN) 0.5 MG tablet Take 1 tablet (0.5 mg total) by mouth 2 (two) times daily as needed for anxiety.  . meclizine (ANTIVERT) 25 MG tablet Take 1 tablet (25 mg total) by mouth 3 (three) times daily as needed for dizziness.  . methocarbamol (ROBAXIN) 500 MG tablet Take 500 mg by mouth 4 (four) times daily as needed for muscle spasms.  . Multiple Vitamin (MULTIVITAMIN) tablet Take 1  tablet by mouth daily. For supplement  . nitroGLYCERIN (NITROSTAT) 0.4 MG SL tablet Place 1 tablet (0.4 mg total) under the tongue every 5 (five) minutes as needed for chest pain (up to 3 doses).  . pantoprazole (PROTONIX) 40 MG tablet Take 1 tablet (40 mg total) by mouth 2 (two) times daily.  . potassium chloride SA (K-DUR,KLOR-CON) 20 MEQ tablet Take 1 tablet (20 mEq total) by mouth daily.  . rosuvastatin (CRESTOR) 10 MG tablet Take 10 mg by mouth as directed.  . [DISCONTINUED] rosuvastatin (CRESTOR) 5 MG tablet Take 5 mg by mouth as directed.   No facility-administered encounter medications on file as of 02/15/2018.       Review of Systems  Constitutional: Negative.   HENT: Negative.   Eyes: Negative.   Respiratory: Negative.   Cardiovascular: Negative.   Gastrointestinal: Negative.   Endocrine: Negative.   Genitourinary: Negative.   Musculoskeletal: Positive for arthralgias (still has soreness in right hip).  Skin: Negative.   Allergic/Immunologic: Negative.   Neurological: Negative.   Hematological: Negative.   Psychiatric/Behavioral: Negative.        Objective:  Physical Exam  Constitutional: She is oriented to person, place, and time. She appears well-developed and well-nourished. No distress.  The patient is pleasant and alert and using her walker to come to the visit today.  HENT:  Head: Normocephalic and atraumatic.  Right Ear: External ear normal.  Left Ear: External ear normal.  Mouth/Throat: Oropharynx is clear and moist. No oropharyngeal exudate.  Nasal congestion left greater than right  Eyes: Conjunctivae and EOM are normal. Pupils are equal, round, and reactive to light. Right eye exhibits no discharge. Left eye exhibits no discharge. No scleral icterus.  Neck: Normal range of motion. Neck supple. No thyromegaly present.  Cardiovascular: Normal rate and normal heart sounds.  No murmur heard. Irregular irregular rhythm at 96/min  Pulmonary/Chest: Effort  normal and breath sounds normal. No respiratory distress. She has no wheezes. She has no rales.  Clear anteriorly and posteriorly  Abdominal: Soft. Bowel sounds are normal. She exhibits no mass. There is no tenderness. There is no rebound and no guarding.  No abdominal masses tenderness or organ enlargement or bruits.  Musculoskeletal: She exhibits tenderness. She exhibits no edema.  Range of motion is limited due to chronic ongoing back pain and status post fusion of thoracic and lumbar spine.  Patient needs assistance with getting off the table and laying down and sitting up.  There is stiffness and decreased mobility also of both knees  Lymphadenopathy:    She has no cervical adenopathy.  Neurological: She is alert and oriented to person, place, and time. She has normal reflexes. No cranial nerve deficit.  Skin: Skin is warm and dry. No rash noted.  Psychiatric: She has a normal mood and affect. Her behavior is normal. Judgment and thought content normal.  Nursing note and vitals reviewed.   BP 100/69 (BP Location: Left Arm)   Pulse 81   Temp 97.7 F (36.5 C) (Oral)   Ht 5' 7"  (1.702 m)   Wt 200 lb (90.7 kg)   BMI 31.32 kg/m        Assessment & Plan:  1. Essential hypertension -The blood pressure is good today and she will continue with current treatment - BMP8+EGFR - CBC with Differential/Platelet  2. Pure hypercholesterolemia -Continue with Crestor pending results of lab work - CBC with Differential/Platelet - Lipid panel  3. Vitamin D deficiency -Continue with vitamin D replacement pending results of lab work - CBC with Differential/Platelet - VITAMIN D 25 Hydroxy (Vit-D Deficiency, Fractures)  4. Iron deficiency anemia, unspecified iron deficiency anemia type - CBC with Differential/Platelet  5. Paroxysmal atrial fibrillation (HCC) -Follow-up with cardiology as planned - CBC with Differential/Platelet  6. Gastroesophageal reflux disease, esophagitis presence  not specified -Continue with pantoprazole as patient is not having any trouble with her swallowing or heartburn. - CBC with Differential/Platelet - Hepatic function panel  7. Chronic diastolic CHF (congestive heart failure) ([place]) -Follow-up with cardiology as planned  8. Adaptive colitis -Follow-up with gastroenterology as planned  9. Stable angina pectoris [facility]) -Follow-up with cardiology as planned  10.  Chronic back pain greater than 3 months duration -Would recommend a lift chair to assist patient with arising from a sitting position  11.  Osteoarthritis multiple joints -Joint stiffness and pain in both knees as well as back pain with neuropathy.  This definitely creates problems with mobility for this patient and places her in an increased risk of falling.  Meds ordered this encounter  Medications  . Eszopiclone 3 MG TABS    Sig:  Take 1 tablet (3 mg total) by mouth at bedtime. For insomnia,take immediately before bedtime.    Dispense:  90 tablet    Refill:  1   Patient Instructions                       Medicare Annual Wellness Visit  [facility] and the medical providers at [facility] strive to bring you the best medical care.  In doing so we not only want to address your current medical conditions and concerns but also to detect new conditions early and prevent illness, disease and health-related problems.    Medicare offers a yearly Wellness Visit which allows our clinical staff to assess your need for preventative services including immunizations, lifestyle education, counseling to decrease risk of preventable diseases and screening for fall risk and other medical concerns.    This visit is provided free of charge (no copay) for all Medicare recipients. The clinical pharmacists at [facility] have begun to conduct these Wellness Visits which will also include a thorough review of all your medications.    As you primary  medical provider recommend that you make an appointment for your Annual Wellness Visit if you have not done so already this year.  You may set up this appointment before you leave today or you may call back ([phone]) and schedule an appointment.  Please make sure when you call that you mention that you are scheduling your Annual Wellness Visit with the clinical pharmacist so that the appointment may be made for the proper length of time.     Continue current medications. Continue good therapeutic lifestyle changes which include good diet and exercise. Fall precautions discussed with patient. If an FOBT was given today- please return it to our front desk. If you are over 29 years old - you may need Prevnar 62 or the adult Pneumonia vaccine.  **Flu shots are available--- please call and schedule a FLU-CLINIC appointment**  After your visit with [place] today you will receive a survey in the mail or online from [employer] regarding your care with [place]. Please take a moment to fill this out. Your feedback is very important to [place] as you can help [place] better understand your patient needs as well as improve your experience and satisfaction. WE CARE ABOUT YOU!!!  Please call gastroenterology to check on date of next colonoscopy, Dr. Carlton Adam Discuss cannabis oil with orthopedic surgeon Continue to follow up regularly with orthopedic surgeon and with cardiology and with neurology. Continue to be careful do not put yourself at risk for falling    Arrie Senate MD

## 2018-02-15 NOTE — Patient Instructions (Addendum)
Medicare Annual Wellness Visit  [facility] and the medical providers at [facility] strive to bring you the best medical care.  In doing so we not only want to address your current medical conditions and concerns but also to detect new conditions early and prevent illness, disease and health-related problems.    Medicare offers a yearly Wellness Visit which allows our clinical staff to assess your need for preventative services including immunizations, lifestyle education, counseling to decrease risk of preventable diseases and screening for fall risk and other medical concerns.    This visit is provided free of charge (no copay) for all Medicare recipients. The clinical pharmacists at [facility] have begun to conduct these Wellness Visits which will also include a thorough review of all your medications.    As you primary medical provider recommend that you make an appointment for your Annual Wellness Visit if you have not done so already this year.  You may set up this appointment before you leave today or you may call back ([phone]) and schedule an appointment.  Please make sure when you call that you mention that you are scheduling your Annual Wellness Visit with the clinical pharmacist so that the appointment may be made for the proper length of time.     Continue current medications. Continue good therapeutic lifestyle changes which include good diet and exercise. Fall precautions discussed with patient. If an FOBT was given today- please return it to our front desk. If you are over 6 years old - you may need Prevnar 1 or the adult Pneumonia vaccine.  **Flu shots are available--- please call and schedule a FLU-CLINIC appointment**  After your visit with [place] today you will receive a survey in the mail or online from [employer] regarding your care with [place]. Please take a moment to fill this out. Your feedback is very  important to [place] as you can help [place] better understand your patient needs as well as improve your experience and satisfaction. WE CARE ABOUT YOU!!!  Please call gastroenterology to check on date of next colonoscopy, Dr. Carlton Adam Discuss cannabis oil with orthopedic surgeon Continue to follow up regularly with orthopedic surgeon and with cardiology and with neurology. Continue to be careful do not put yourself at risk for falling

## 2018-02-16 LAB — HEPATIC FUNCTION PANEL
ALT: 21 IU/L (ref 0–32)
AST: 25 IU/L (ref 0–40)
Albumin: 4.3 g/dL (ref 3.5–4.8)
Alkaline Phosphatase: 113 IU/L (ref 39–117)
BILIRUBIN TOTAL: 0.4 mg/dL (ref 0.0–1.2)
BILIRUBIN, DIRECT: 0.13 mg/dL (ref 0.00–0.40)
Total Protein: 6.4 g/dL (ref 6.0–8.5)

## 2018-02-16 LAB — CBC WITH DIFFERENTIAL/PLATELET
BASOS: 0 %
Basophils Absolute: 0 10*3/uL (ref 0.0–0.2)
EOS (ABSOLUTE): 0.1 10*3/uL (ref 0.0–0.4)
EOS: 2 %
HEMATOCRIT: 38.5 % (ref 34.0–46.6)
Hemoglobin: 12.5 g/dL (ref 11.1–15.9)
Immature Grans (Abs): 0 10*3/uL (ref 0.0–0.1)
Immature Granulocytes: 0 %
LYMPHS ABS: 1.8 10*3/uL (ref 0.7–3.1)
Lymphs: 28 %
MCH: 30.2 pg (ref 26.6–33.0)
MCHC: 32.5 g/dL (ref 31.5–35.7)
MCV: 93 fL (ref 79–97)
MONOS ABS: 0.6 10*3/uL (ref 0.1–0.9)
Monocytes: 9 %
NEUTROS ABS: 4 10*3/uL (ref 1.4–7.0)
Neutrophils: 61 %
PLATELETS: 246 10*3/uL (ref 150–379)
RBC: 4.14 x10E6/uL (ref 3.77–5.28)
RDW: 13.9 % (ref 12.3–15.4)
WBC: 6.6 10*3/uL (ref 3.4–10.8)

## 2018-02-16 LAB — BMP8+EGFR
BUN / CREAT RATIO: 14 (ref 12–28)
BUN: 13 mg/dL (ref 8–27)
CO2: 26 mmol/L (ref 20–29)
Calcium: 9.6 mg/dL (ref 8.7–10.3)
Chloride: 99 mmol/L (ref 96–106)
Creatinine, Ser: 0.92 mg/dL (ref 0.57–1.00)
GFR, EST AFRICAN AMERICAN: 69 mL/min/{1.73_m2} (ref >59)
GFR, EST NON AFRICAN AMERICAN: 60 mL/min/{1.73_m2} (ref >59)
Glucose: 113 mg/dL — ABNORMAL HIGH (ref 65–99)
POTASSIUM: 4.4 mmol/L (ref 3.5–5.2)
Sodium: 144 mmol/L (ref 134–144)

## 2018-02-16 LAB — LIPID PANEL
CHOL/HDL RATIO: 2.2 ratio (ref 0.0–4.4)
Cholesterol, Total: 144 mg/dL (ref 100–199)
HDL: 66 mg/dL (ref >39)
LDL Calculated: 56 mg/dL (ref 0–99)
Triglycerides: 112 mg/dL (ref 0–149)
VLDL Cholesterol Cal: 22 mg/dL (ref 5–40)

## 2018-02-16 LAB — VITAMIN D 25 HYDROXY (VIT D DEFICIENCY, FRACTURES): VIT D 25 HYDROXY: 46.6 ng/mL (ref 30.0–100.0)

## 2018-02-17 ENCOUNTER — Telehealth: Payer: Self-pay | Admitting: Family Medicine

## 2018-02-17 NOTE — Telephone Encounter (Signed)
Pt aware of all labs 

## 2018-02-24 ENCOUNTER — Telehealth: Payer: Self-pay | Admitting: Family Medicine

## 2018-02-24 ENCOUNTER — Telehealth: Payer: Self-pay | Admitting: Cardiovascular Disease

## 2018-02-24 NOTE — Telephone Encounter (Signed)
Daughter aware of recommendations.

## 2018-02-24 NOTE — Telephone Encounter (Signed)
Call patient.  Take Tylenol, 2 extra strength every 4-6 hours and get plenty of clear liquids and and call us in the morning if things are worse.

## 2018-02-24 NOTE — Telephone Encounter (Signed)
Patient's daughter reports the patient is nauseous and she thinks she might be getting the flu. She wants to know if she can give the patient a dose of her (the daughter's) Zofran to alleviate nausea. Informed patient's daughter no one should take prescription medications that are not prescribed to them personally. Instructed her to call the patient's PCP for assistance and to maybe schedule an appointment for evaluation. She was grateful for call and agrees with treatment plan.

## 2018-02-24 NOTE — Telephone Encounter (Signed)
New Message  Pt c/o medication issue:  1. Name of Medication: Zofran  2. How are you currently taking this medication (dosage and times per day)? 800 mg  3. Are you having a reaction (difficulty breathing--STAT)? No  4. What is your medication issue? Pt daughter verbalized that the pt is very nauseated and she want to know if she can give her some of the Zofran she has on hand. Please call

## 2018-02-24 NOTE — Telephone Encounter (Signed)
What symptoms do you have? Headache, Nausea, Chills and Temp was 99.1  How long have you been sick? Last three hours   Have you been seen for this problem? NO  If your provider decides to give you a prescription, which pharmacy would you like for it to be sent to? [facility] in [place]   Patient informed that this information will be sent to the clinical staff for review and that they should receive a follow up call.

## 2018-03-09 ENCOUNTER — Telehealth: Payer: Self-pay | Admitting: Family Medicine

## 2018-03-10 NOTE — Telephone Encounter (Signed)
[name] [name] - have you already taken care of this - I have not seen the form

## 2018-03-10 NOTE — Telephone Encounter (Signed)
Written script placed on providers desk for signature

## 2018-03-10 NOTE — Telephone Encounter (Signed)
Completed and at front desk for patient to pickup

## 2018-03-21 ENCOUNTER — Other Ambulatory Visit: Payer: Self-pay | Admitting: *Deleted

## 2018-04-12 ENCOUNTER — Ambulatory Visit: Payer: Medicare HMO | Admitting: Family Medicine

## 2018-04-20 DIAGNOSIS — L900 Lichen sclerosus et atrophicus: Secondary | ICD-10-CM | POA: Diagnosis not present

## 2018-04-20 DIAGNOSIS — Z01419 Encounter for gynecological examination (general) (routine) without abnormal findings: Secondary | ICD-10-CM | POA: Diagnosis not present

## 2018-04-27 ENCOUNTER — Ambulatory Visit: Payer: Medicare HMO | Admitting: Family Medicine

## 2018-04-28 ENCOUNTER — Other Ambulatory Visit: Payer: Self-pay

## 2018-04-28 MED ORDER — ELIQUIS 5 MG PO TABS: 5.0000 mg | ORAL_TABLET | Freq: Two times a day (BID) | ORAL | 2 refills | Status: DC

## 2018-04-28 NOTE — Telephone Encounter (Signed)
Pt is a 77 yr old female who last saw Dr Burt Knack on 12/08/17, she has a pending appt on 06/06/18. Last weight and SCr on 02/15/18 were 90.7Kg and 0.92 respectively. Will refill her Eliquis 5mg  BID.

## 2018-05-09 ENCOUNTER — Other Ambulatory Visit: Payer: Self-pay | Admitting: Family Medicine

## 2018-05-10 NOTE — Telephone Encounter (Signed)
OV 06/21/18

## 2018-05-17 DIAGNOSIS — M4126 Other idiopathic scoliosis, lumbar region: Secondary | ICD-10-CM | POA: Diagnosis not present

## 2018-05-17 DIAGNOSIS — Z981 Arthrodesis status: Secondary | ICD-10-CM | POA: Diagnosis not present

## 2018-05-17 DIAGNOSIS — M5136 Other intervertebral disc degeneration, lumbar region: Secondary | ICD-10-CM | POA: Diagnosis not present

## 2018-05-23 ENCOUNTER — Other Ambulatory Visit: Payer: Self-pay | Admitting: Physician Assistant

## 2018-05-23 ENCOUNTER — Other Ambulatory Visit: Payer: Self-pay | Admitting: Family Medicine

## 2018-05-23 DIAGNOSIS — I5032 Chronic diastolic (congestive) heart failure: Secondary | ICD-10-CM

## 2018-05-23 DIAGNOSIS — I480 Paroxysmal atrial fibrillation: Secondary | ICD-10-CM

## 2018-05-23 DIAGNOSIS — L900 Lichen sclerosus et atrophicus: Secondary | ICD-10-CM | POA: Diagnosis not present

## 2018-05-23 DIAGNOSIS — I2510 Atherosclerotic heart disease of native coronary artery without angina pectoris: Secondary | ICD-10-CM

## 2018-05-23 DIAGNOSIS — I482 Chronic atrial fibrillation, unspecified: Secondary | ICD-10-CM

## 2018-05-26 DIAGNOSIS — K6289 Other specified diseases of anus and rectum: Secondary | ICD-10-CM | POA: Diagnosis not present

## 2018-05-26 DIAGNOSIS — R159 Full incontinence of feces: Secondary | ICD-10-CM | POA: Diagnosis not present

## 2018-05-26 DIAGNOSIS — L900 Lichen sclerosus et atrophicus: Secondary | ICD-10-CM | POA: Diagnosis not present

## 2018-05-26 DIAGNOSIS — Z8719 Personal history of other diseases of the digestive system: Secondary | ICD-10-CM | POA: Diagnosis not present

## 2018-05-26 DIAGNOSIS — K625 Hemorrhage of anus and rectum: Secondary | ICD-10-CM | POA: Diagnosis not present

## 2018-05-26 DIAGNOSIS — N904 Leukoplakia of vulva: Secondary | ICD-10-CM | POA: Diagnosis not present

## 2018-05-26 DIAGNOSIS — R2242 Localized swelling, mass and lump, left lower limb: Secondary | ICD-10-CM | POA: Diagnosis not present

## 2018-05-26 DIAGNOSIS — Z7901 Long term (current) use of anticoagulants: Secondary | ICD-10-CM | POA: Diagnosis not present

## 2018-05-26 DIAGNOSIS — R194 Change in bowel habit: Secondary | ICD-10-CM | POA: Diagnosis not present

## 2018-06-06 ENCOUNTER — Encounter: Payer: Self-pay | Admitting: Cardiovascular Disease

## 2018-06-06 ENCOUNTER — Ambulatory Visit: Payer: Medicare HMO | Admitting: Cardiovascular Disease

## 2018-06-06 VITALS — BP 112/70 | HR 120 | Ht 67.0 in | Wt 197.8 lb

## 2018-06-06 DIAGNOSIS — I5032 Chronic diastolic (congestive) heart failure: Secondary | ICD-10-CM | POA: Diagnosis not present

## 2018-06-06 DIAGNOSIS — I482 Chronic atrial fibrillation: Secondary | ICD-10-CM

## 2018-06-06 DIAGNOSIS — I4821 Permanent atrial fibrillation: Secondary | ICD-10-CM

## 2018-06-06 NOTE — Progress Notes (Signed)
Cardiology Office Note Date:  06/08/2018   ID:  [name] [name], DOB 19-Jan-1941, MRN [id number]  PCP:  [name], MD  Cardiologist:  Sherren Mocha, MD    Chief Complaint  Patient presents with  . Shortness of Breath     History of Present Illness: [name] [name] is a 77 y.o. female who presents for follow-up of atrial fibrillation, CAD with angina, and chronic diastolic CHF.   She is here with her daughter today. Continues to struggle with weakness, back problems, and shortness of breath with nearly any activity. No change in her chest pain pattern.  No exertional chest pain.  She continues to have episodes of left-sided chest discomfort which occur rarely.  She does not require sublingual nitroglycerin.  Her chest pain is nonradiating.  She does complain of leg swelling.  She denies orthopnea or PND.  Past Medical History:  Diagnosis Date  . Abnormal nuclear cardiac imaging test   . Anemia   . Anemia, iron deficiency 07/02/2015  . Anxiety   . Arthritis    "knees, back, fingers, toes; joints" (01/07/2015)  . 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 CAD (coronary artery disease)    a. Abnl nuc 11/2014. Cath 12/2014 - turned down for CABG. Ultimately s/p TTVP, rotational atherectomy, PTCA and stenting of the ostial LCx and left main into the LAD (crush technique), and IVUS of the LAD/Left main. // b. Myoview 11/17: EF 48, poor quality/significant artifact; inf-lateral, inferior ischemia; Intermediate Risk  . Chronic atrial fibrillation ([place])    a.  First noted post-op 9/15 spinal fusion. She had cardioversion, not on anticoagulation. Fall risk, unsteady. // failed DCCV // Holter 10/17: AFib, Avg HR 97, PVCs, no other arrhythmia  . Chronic back pain greater than 3 months duration    a. spinal stenosis. Spinal fusion with rods in 2/15 at [facility] spinal fusion 9/15.  Marland Kitchen Chronic diastolic CHF (congestive heart failure) ([place])   . Circadian rhythm sleep disorder   . CTS (carpal  tunnel syndrome)   . Deficiency anemia 11/10/2014  . Depression   . Diarrhea   . Diverticulitis of colon   . Esophageal stricture   . Gastritis   . Gastroesophageal hernia 07/06/2013  . GERD (gastroesophageal reflux disease)   . Hiatal hernia   . History of blood transfusion    "most of them related to OR's"   . History of echocardiogram    a. Echo 2/17:  EF 50-55%, trivial AI, midl MR, mod LAE, PASP 37 mmHg  . HTN (hypertension)   . Hyperlipidemia   . IBS (irritable bowel syndrome)   . Incontinence 10/13/2012  . Insomnia   . Ischemic chest pain   . Memory disorder 12/04/2014  . Metabolic syndrome 02/25/1695  . MGUS (monoclonal gammopathy of unknown significance) dx'd 11/2014   a. Neg BMB 11/2014.  . Multiple falls   . Obesity   . Obstructive sleep apnea    "have mask; don't wear it" (01/07/2015)  . Orthostasis   . PAT (paroxysmal atrial tachycardia) ([place])   . Personal history of colonic polyps 10/25/2011 & 12/02/11   not retrieved Dr Lyla Son & tubular adenomas  . Pneumonia 03/2014  . Sinus bradycardia    a. Baseline HR 50s-60s.  . Stroke [facility] early 2000's   "small"; denies residual on 01/07/2015)    Past Surgical History:  Procedure Laterality Date  . BACK SURGERY    . BONE MARROW BIOPSY  11/2014  . CARDIAC CATHETERIZATION  12/27/2014   Procedure: INTRAVASCULAR PRESSURE WIRE/FFR STUDY;  Surgeon: Larey Dresser, MD;  Location: [facility] CATH LAB;  Service: Cardiovascular;;  . CARDIAC CATHETERIZATION N/A 11/25/2016   Procedure: Left Heart Cath and Coronary Angiography;  Surgeon: Sherren Mocha, MD;  Location: [place] CV LAB;  Service: Cardiovascular;  Laterality: N/A;  . CARDIOVERSION N/A 02/13/2016   Procedure: CARDIOVERSION;  Surgeon: Josue Hector, MD;  Location: [facility] ENDOSCOPY;  Service: Cardiovascular;  Laterality: N/A;  . CARPAL TUNNEL RELEASE Right 1980's  . CATARACT EXTRACTION Bilateral   . CATARACT EXTRACTION W/ INTRAOCULAR LENS  IMPLANT, BILATERAL  2000's  .  CORONARY ANGIOPLASTY WITH STENT PLACEMENT  01/07/2015   "2"  . CORONARY STENT PLACEMENT    . DILATION AND CURETTAGE OF UTERUS    . ESOPHAGOGASTRODUODENOSCOPY (EGD) WITH ESOPHAGEAL DILATION  "several times"  . JOINT REPLACEMENT    . KNEE ARTHROSCOPY Left 1995  . LAPAROSCOPIC CHOLECYSTECTOMY  2003  . LEFT HEART CATHETERIZATION WITH CORONARY ANGIOGRAM N/A 12/27/2014   Procedure: LEFT HEART CATHETERIZATION WITH CORONARY ANGIOGRAM;  Surgeon: Larey Dresser, MD;  Location: [facility] CATH LAB;  Service: Cardiovascular;  Laterality: N/A;  . PERCUTANEOUS CORONARY ROTOBLATOR INTERVENTION (PCI-R)  01/07/2015  . PERCUTANEOUS CORONARY ROTOBLATOR INTERVENTION (PCI-R) N/A 01/07/2015   Procedure: PERCUTANEOUS CORONARY ROTOBLATOR INTERVENTION (PCI-R);  Surgeon: Blane Ohara, MD;  Location: [facility] CATH LAB;  Service: Cardiovascular;  Laterality: N/A;  . POSTERIOR FUSION THORACIC SPINE  08/2015  . POSTERIOR LUMBAR FUSION  01/2015  . TOTAL KNEE ARTHROPLASTY Bilateral 1990's - 2000's    Current Outpatient Medications  Medication Sig Dispense Refill  . aspirin EC 81 MG tablet Take 1 tablet (81 mg total) by mouth daily. 90 tablet 3  . Calcium Citrate-Vitamin D (CALCIUM + D PO) Take 1 tablet by mouth daily.    . Cholecalciferol (VITAMIN D) 2000 units tablet Take 2,000 Units by mouth daily.    . clobetasol cream (TEMOVATE) 2.97 % Apply 1 application topically See admin instructions. Applies to vaginal area twice a week.    . diltiazem (CARDIZEM CD) 120 MG 24 hr capsule Take 1 capsule (120 mg total) by mouth 2 (two) times daily. 180 capsule 1  . diphenoxylate-atropine (LOMOTIL) 2.5-0.025 MG per tablet Take 1 tablet by mouth 4 (four) times daily as needed for diarrhea or loose stools.     Marland Kitchen ELIQUIS 5 MG TABS tablet Take 1 tablet (5 mg total) by mouth 2 (two) times daily. 180 tablet 2  . Eszopiclone 3 MG TABS Take 1 tablet (3 mg total) by mouth at bedtime. For insomnia,take immediately before bedtime. 90 tablet 1  . fentaNYL  (DURAGESIC - DOSED MCG/HR) 50 MCG/HR Place 1 patch (50 mcg total) onto the skin every 3 (three) days. 10 patch 0  . FLUoxetine (PROZAC) 40 MG capsule Take 40 mg by mouth daily.    . furosemide (LASIX) 40 MG tablet TAKE 1.5 TABLETS (60 MG TOTAL) BY MOUTH DAILY 135 tablet 3  . HYDROcodone-acetaminophen (NORCO) 10-325 MG per tablet Take 1 tablet by mouth every 4 (four) hours as needed for moderate pain.     Marland Kitchen LORazepam (ATIVAN) 0.5 MG tablet Take 1 tablet (0.5 mg total) by mouth 2 (two) times daily as needed for anxiety. 60 tablet 1  . meclizine (ANTIVERT) 25 MG tablet Take 1 tablet (25 mg total) by mouth 3 (three) times daily as needed for dizziness. 60 tablet 0  . methocarbamol (ROBAXIN) 500 MG tablet Take 500 mg by mouth 4 (four) times  daily as needed for muscle spasms.    . Multiple Vitamin (MULTIVITAMIN) tablet Take 1 tablet by mouth daily. For supplement    . nitroGLYCERIN (NITROSTAT) 0.4 MG SL tablet Place 1 tablet (0.4 mg total) under the tongue every 5 (five) minutes as needed for chest pain (up to 3 doses). 25 tablet 3  . pantoprazole (PROTONIX) 40 MG tablet TAKE 1 TABLET TWICE A DAY 180 tablet 0  . potassium chloride SA (K-DUR,KLOR-CON) 20 MEQ tablet Take 1 tablet (20 mEq total) by mouth daily. 90 tablet 3  . rosuvastatin (CRESTOR) 10 MG tablet Take 10 mg by mouth as directed.     No current facility-administered medications for this visit.     Allergies:   Buprenorphine hcl; Cymbalta [duloxetine hcl]; Morphine and related; Oxycodone-acetaminophen; Valium [diazepam]; Statins; Altace [ramipril]; Floxin [ofloxacin]; Hydromorphone; Lovaza [omega-3-acid ethyl esters]; Trilipix [choline fenofibrate]; and Zetia [ezetimibe]   Social History:  The patient  reports that she has never smoked. She has never used smokeless tobacco. She reports that she does not drink alcohol or use drugs.   Family History:  The patient's family history includes Colon cancer in her sister and sister; Coronary artery  disease in her brother, father, and mother; Emphysema in her brother and sister; Peripheral vascular disease in her father; Sleep apnea in her son.    ROS:  Please see the history of present illness.  Otherwise, review of systems is positive for leg swelling, hearing loss, back pain, muscle pain, dizziness, easy bruising, balance problems.  All other systems are reviewed and negative.    PHYSICAL EXAM: VS:  BP 112/70   Pulse (!) 120   Ht _0  (1.702 m)   Wt 197 lb 12.8 oz (89.7 kg)   SpO2 97%   BMI 30.98 kg/m  , BMI Body mass index is 30.98 kg/m. GEN: Well nourished, well developed, in no acute distress  HEENT: normal  Neck: no JVD, no masses. No carotid bruits Cardiac: tachy and irregular without murmur or gallop                Respiratory:  clear to auscultation bilaterally, normal work of breathing GI: soft, nontender, nondistended, + BS MS: no deformity or atrophy  Ext: no pretibial edema, pedal pulses 2+= bilaterally Skin: warm and dry, no rash Neuro:  Strength and sensation are intact Psych: euthymic mood, full affect  EKG:  EKG is not ordered today.  Recent Labs: 10/06/2017: BNP 165.8 02/15/2018: ALT 21; BUN 13; Creatinine, Ser 0.92; Hemoglobin 12.5; Platelets 246; Potassium 4.4; Sodium 144   Lipid Panel     Component Value Date/Time   CHOL 144 02/15/2018 1246   CHOL 156 05/11/2013 1536   TRIG 112 02/15/2018 1246   TRIG 121 02/18/2016 1504   TRIG 239 (H) 05/11/2013 1536   HDL 66 02/15/2018 1246   HDL 57 02/18/2016 1504   HDL 43 05/11/2013 1536   CHOLHDL 2.2 02/15/2018 1246   LDLCALC 56 02/15/2018 1246   LDLCALC 50 08/21/2014 1141   LDLCALC 65 05/11/2013 1536      Wt Readings from Last 3 Encounters:  06/06/18 197 lb 12.8 oz (89.7 kg)  02/15/18 200 lb (90.7 kg)  12/08/17 199 lb 12.8 oz (90.6 kg)     Cardiac Studies Reviewed: 2D Echo 12/22/2017: Study Conclusions  - Left ventricle: The cavity size was normal. Systolic function was   normal. The  estimated ejection fraction was in the range of 55%   to 60%. Wall motion  was normal; there were no regional wall   motion abnormalities. - Aortic valve: Transvalvular velocity was within the normal range.   There was no stenosis. There was no regurgitation. - Mitral valve: Mildly calcified annulus. Transvalvular velocity   was within the normal range. There was no evidence for stenosis.   There was trivial regurgitation. - Left atrium: The atrium was severely dilated. - Right ventricle: The cavity size was normal. Wall thickness was   normal. Systolic function was normal. - Tricuspid valve: There was mild regurgitation. - Pulmonary arteries: Systolic pressure was within the normal   range. PA peak pressure: 31 mm Hg (S).  ASSESSMENT AND PLAN: 1.  Chronic diastolic heart failure, New York Heart Association functional class III symptoms: Her symptoms are multifactorial with severe deconditioning being a major problem.  We had extensive discussion today.  She does not appear volume overloaded on exam.  Her echo from January 2019 is reviewed and shows no significant valvular disease.  She will continue on her current medical program.  We talked about initiation of a walking program, even if it is just a few steps at a time at first.  She will work on this.  I will see her back in 6 months with a repeat echocardiogram.  2.  Permanent atrial fibrillation: Rate control has been difficult.  Fortunately her LV function has not deteriorated over time.  She is anticoagulated with apixaban.  Continue diltiazem for heart rate control.  I will see her back in 6 months with an echocardiogram.  3.  Coronary artery disease, native vessel, with angina: Her medication program is reviewed and will be continued without change.  She is on aspirin 81 mg after left main stenting in addition to oral anticoagulation.  She continues on diltiazem.  No exertional angina or progressive symptoms noted.  Current medicines  are reviewed with the patient today.  The patient does not have concerns regarding medicines.  Labs/ tests ordered today include:   Orders Placed This Encounter  Procedures  . ECHOCARDIOGRAM COMPLETE    Disposition:   FU 6 months  Signed, Sherren Mocha, MD  06/08/2018 10:25 PM    [facility] [place], [place], [place]  [address] Phone: [phone]; Fax: [phone]

## 2018-06-06 NOTE — Patient Instructions (Addendum)
Medication Instructions:  Your provider recommends that you continue on your current medications as directed. Please refer to the Current Medication list given to you today.    Labwork: None  Testing/Procedures: Your provider has requested that you have an echocardiogram. Echocardiography is a painless test that uses sound waves to create images of your heart. It provides your doctor with information about the size and shape of your heart and how well your heart's chambers and valves are working. This procedure takes approximately one hour. There are no restrictions for this procedure. You have been scheduled for your echo on Thursday, 11/24/2018 at 2:00PM. Please arrive at 1:30PM.  Follow-Up: You have an appointment scheduled with Dr. Burt Knack on Monday, November 28, 2018 at 2:20PM.  Any Other Special Instructions Will Be Listed Below (If Applicable).     If you need a refill on your cardiac medications before your next appointment, please call your pharmacy.

## 2018-06-08 DIAGNOSIS — G894 Chronic pain syndrome: Secondary | ICD-10-CM | POA: Diagnosis not present

## 2018-06-21 ENCOUNTER — Encounter: Payer: Self-pay | Admitting: Family Medicine

## 2018-06-21 ENCOUNTER — Ambulatory Visit (INDEPENDENT_AMBULATORY_CARE_PROVIDER_SITE_OTHER): Payer: Medicare HMO | Admitting: Family Medicine

## 2018-06-21 VITALS — BP 98/76 | HR 82 | Temp 97.2°F | Ht 67.0 in | Wt 192.0 lb

## 2018-06-21 DIAGNOSIS — M549 Dorsalgia, unspecified: Secondary | ICD-10-CM | POA: Diagnosis not present

## 2018-06-21 DIAGNOSIS — G8929 Other chronic pain: Secondary | ICD-10-CM | POA: Diagnosis not present

## 2018-06-21 DIAGNOSIS — E7800 Pure hypercholesterolemia, unspecified: Secondary | ICD-10-CM

## 2018-06-21 DIAGNOSIS — I480 Paroxysmal atrial fibrillation: Secondary | ICD-10-CM | POA: Diagnosis not present

## 2018-06-21 DIAGNOSIS — E559 Vitamin D deficiency, unspecified: Secondary | ICD-10-CM | POA: Diagnosis not present

## 2018-06-21 DIAGNOSIS — I5032 Chronic diastolic (congestive) heart failure: Secondary | ICD-10-CM

## 2018-06-21 DIAGNOSIS — D509 Iron deficiency anemia, unspecified: Secondary | ICD-10-CM | POA: Diagnosis not present

## 2018-06-21 DIAGNOSIS — R531 Weakness: Secondary | ICD-10-CM | POA: Diagnosis not present

## 2018-06-21 DIAGNOSIS — I10 Essential (primary) hypertension: Secondary | ICD-10-CM | POA: Diagnosis not present

## 2018-06-21 DIAGNOSIS — K219 Gastro-esophageal reflux disease without esophagitis: Secondary | ICD-10-CM | POA: Diagnosis not present

## 2018-06-21 LAB — URINALYSIS, COMPLETE
BILIRUBIN UA: NEGATIVE
GLUCOSE, UA: NEGATIVE
Leukocytes, UA: NEGATIVE
Nitrite, UA: NEGATIVE
SPEC GRAV UA: 1.025 (ref 1.005–1.030)
Urobilinogen, Ur: 0.2 mg/dL (ref 0.2–1.0)
pH, UA: 5 (ref 5.0–7.5)

## 2018-06-21 LAB — MICROSCOPIC EXAMINATION

## 2018-06-21 MED ORDER — POTASSIUM CHLORIDE CRYS ER 20 MEQ PO TBCR: 20.0000 meq | EXTENDED_RELEASE_TABLET | Freq: Every day | ORAL | 3 refills | Status: DC

## 2018-06-21 NOTE — Progress Notes (Signed)
Subjective:    Patient ID: [name] [name], female    DOB: 11/11/1941, 78 y.o.   MRN: [id number]  HPI  Pt here for follow up and management of chronic medical problems which includes hypertension and hyperlipidemia. She is taking medication regularly.  The patient today complains of weakness and occasional dizziness.  She is requesting a refill on her potassium.  Her last colonoscopy was done and August 2017.  She last saw her cardiologist, Dr. Burt Knack the middle of June.  She has coronary artery disease native vessel with angina.  She has chronic diastolic heart failure and rate controlled atrial fibrillation.  She is going to see him again in 6 months.  Patient has chronic low back pain and has had several spinal surgeries causing lot of severe limitation with her activity requiring assistance with getting up sitting down and moving in general.  The biggest complaint today though is ongoing weakness and dizziness.  This is been worse for 1 week.  She denies any chest pain or increased shortness of breath she does have ongoing complaints with swallowing but cannot say that it is any worse than usual and this includes food and medication.  She denies any trouble with nausea vomiting diarrhea blood in the stool or black tarry bowel movements.  She did however have some blood in the stool and it was felt that this was coming from some irritation around the perineal area and not from her colon.  She is passing her water without problems.  Her last colonoscopy was in August 2017.  She does have plans to see the cardiologist again in December and he plans to do an echo prior to that visit.    Patient Active Problem List   Diagnosis Date Noted  . Abnormal nuclear cardiac imaging test   . Chronic diastolic CHF (congestive heart failure) ([place]) 07/17/2015  . Anemia, iron deficiency 07/02/2015  . DDD (degenerative disc disease), lumbar 03/22/2015  . Fall 03/22/2015  . 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  Difficulty hearing 03/22/2015  . Adaptive colitis 03/22/2015  . Lichen 98/92/1194  . Lumbar scoliosis 03/22/2015  . Fracture of pelvis ([place]) 03/22/2015  . Herpes zona 03/22/2015  . History of other specified conditions presenting hazards to health 03/22/2015  . Anemia 01/08/2015  . MGUS (monoclonal gammopathy of unknown significance) 01/08/2015  . Ischemic chest pain   . Permanent atrial fibrillation ([place]) 12/22/2014  . CAD (coronary artery disease) 12/22/2014  . Memory disorder 12/04/2014  . Deficiency anemia 11/10/2014  . Vertigo 10/27/2014  . Angulation of spine 09/18/2014  . Metabolic syndrome 17/40/8144  . HCAP (healthcare-associated pneumonia) 03/27/2014  . S/P spinal fusion 02/23/2014  . Chronic pain associated with significant psychosocial dysfunction 02/23/2014  . Preoperative evaluation of a medical condition to rule out surgical contraindications (TAR required) 10/09/2013  . Abdominal pain 07/06/2013  . Gastroesophageal hernia 07/06/2013  . Incontinence 10/13/2012  . History of IBS 01/26/2012  . 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 EDEMA 06/25/2010  . DEGENERATIVE JOINT DISEASE 04/08/2010  . OTHER DISORDERS OF NERVOUS SYSTEM&SENSE ORGANS 04/08/2010  . Obesity (BMI 30.0-34.9) 04/04/2009  . CIRCADIAN RHYTHM SLEEP D/O DELAY SLEEP PHSE TYPE 04/04/2009  . CARPAL TUNNEL SYNDROME 04/04/2009  . Diarrhea 01/17/2009  . IRRITABLE BOWEL SYNDROME 11/13/2008  . ESOPHAGEAL STRICTURE 11/12/2008  . GERD 11/12/2008  . DUODENITIS, WITHOUT HEMORRHAGE 11/12/2008  . Diaphragmatic hernia without mention of obstruction or gangrene 11/12/2008  . DIVERTICULOSIS, COLON 11/12/2008  . COLONIC POLYPS, HX OF 11/12/2008  .  Obstructive sleep apnea 05/23/2008  . DYSPNEA 05/23/2008  . Hyperlipidemia 05/22/2008  . Depression 05/22/2008  . Essential hypertension 05/22/2008  . INSOMNIA 05/22/2008   Outpatient Encounter Medications as of 06/21/2018  Medication Sig  . aspirin EC 81 MG tablet  Take 1 tablet (81 mg total) by mouth daily.  . Calcium Citrate-Vitamin D (CALCIUM + D PO) Take 1 tablet by mouth daily.  . Cholecalciferol (VITAMIN D) 2000 units tablet Take 2,000 Units by mouth daily.  . clobetasol cream (TEMOVATE) 8.09 % Apply 1 application topically See admin instructions. Applies to vaginal area twice a week.  . diltiazem (CARDIZEM CD) 120 MG 24 hr capsule Take 1 capsule (120 mg total) by mouth 2 (two) times daily.  . diphenoxylate-atropine (LOMOTIL) 2.5-0.025 MG per tablet Take 1 tablet by mouth 4 (four) times daily as needed for diarrhea or loose stools.   Marland Kitchen ELIQUIS 5 MG TABS tablet Take 1 tablet (5 mg total) by mouth 2 (two) times daily.  . Eszopiclone 3 MG TABS Take 1 tablet (3 mg total) by mouth at bedtime. For insomnia,take immediately before bedtime.  . fentaNYL (DURAGESIC - DOSED MCG/HR) 50 MCG/HR Place 1 patch (50 mcg total) onto the skin every 3 (three) days.  Marland Kitchen FLUoxetine (PROZAC) 40 MG capsule Take 40 mg by mouth daily.  . furosemide (LASIX) 40 MG tablet TAKE 1.5 TABLETS (60 MG TOTAL) BY MOUTH DAILY  . HYDROcodone-acetaminophen (NORCO) 10-325 MG per tablet Take 1 tablet by mouth every 4 (four) hours as needed for moderate pain.   Marland Kitchen LORazepam (ATIVAN) 0.5 MG tablet Take 1 tablet (0.5 mg total) by mouth 2 (two) times daily as needed for anxiety.  . meclizine (ANTIVERT) 25 MG tablet Take 1 tablet (25 mg total) by mouth 3 (three) times daily as needed for dizziness.  . methocarbamol (ROBAXIN) 500 MG tablet Take 500 mg by mouth 4 (four) times daily as needed for muscle spasms.  . Multiple Vitamin (MULTIVITAMIN) tablet Take 1 tablet by mouth daily. For supplement  . nitroGLYCERIN (NITROSTAT) 0.4 MG SL tablet Place 1 tablet (0.4 mg total) under the tongue every 5 (five) minutes as needed for chest pain (up to 3 doses).  . pantoprazole (PROTONIX) 40 MG tablet TAKE 1 TABLET TWICE A DAY  . potassium chloride SA (K-DUR,KLOR-CON) 20 MEQ tablet Take 1 tablet (20 mEq total) by  mouth daily.  . rosuvastatin (CRESTOR) 10 MG tablet Take 10 mg by mouth as directed.   No facility-administered encounter medications on file as of 06/21/2018.      Review of Systems  Constitutional: Negative.   HENT: Negative.   Eyes: Negative.   Respiratory: Negative.   Cardiovascular: Negative.   Gastrointestinal: Negative.   Endocrine: Negative.   Genitourinary: Negative.   Musculoskeletal: Negative.   Skin: Negative.   Allergic/Immunologic: Negative.   Neurological: Positive for dizziness (occasional ) and weakness.  Hematological: Negative.   Psychiatric/Behavioral: Negative.        Objective:   Physical Exam  Constitutional: She is oriented to person, place, and time. She appears well-developed and well-nourished. No distress.  HENT:  Head: Normocephalic and atraumatic.  Right Ear: External ear normal.  Left Ear: External ear normal.  Nose: Nose normal.  Mouth/Throat: Oropharynx is clear and moist. No oropharyngeal exudate.  Eyes: Pupils are equal, round, and reactive to light. Conjunctivae and EOM are normal. Right eye exhibits no discharge. Left eye exhibits no discharge. No scleral icterus.  Neck: Normal range of motion. Neck supple. No thyromegaly  present.  No bruits thyromegaly or anterior cervical adenopathy  Cardiovascular: Normal rate, normal heart sounds and intact distal pulses.  No murmur heard. The heart is irregular irregular at 96 to 108/min.  Pulmonary/Chest: Effort normal and breath sounds normal. She has no wheezes. She has no rales.  Abdominal: Soft. Bowel sounds are normal. She exhibits no distension and no mass. There is tenderness.  Generalized abdominal tenderness in the epigastric area and some in the suprapubic area.  No liver or spleen enlargement no bruits and no inguinal adenopathy with good inguinal pulses.  Musculoskeletal: She exhibits tenderness. She exhibits no edema.  With arising from a sitting position the patient needs assistance to  get up and be stable with standing she also needs assistance with laying down and with sitting up once she is helped to get on the table.  She has back pain with these movements and stiffness.  She uses a walker for ambulation and moves very cautiously because of her ongoing severe back pain.  This is due to her multiple spinal surgeries and the ongoing pain that she is having even after these surgeries.  Lymphadenopathy:    She has no cervical adenopathy.  Neurological: She is alert and oriented to person, place, and time. She has normal reflexes. No cranial nerve deficit.  Skin: Skin is warm and dry. No rash noted.  Psychiatric: Her behavior is normal. Judgment and thought content normal.  The patient's mood and affect are somewhat flat do to her feeling so bad with dizziness.  She also complains of the weakness and this may be certainly associated with her atrial fibrillation that she is in today.  Nursing note and vitals reviewed.  BP 98/76 (BP Location: Left Arm)   Pulse 82   Temp (!) 97.2 F (36.2 C) (Oral)   Ht 5' 7"  (1.702 m)   Wt 192 lb (87.1 kg)   BMI 30.07 kg/m         Assessment & Plan:  1. Pure hypercholesterolemia -Continue with as aggressive therapeutic lifestyle changes as possible and current medication pending results of lab work - Lipid panel  2. Essential hypertension -The blood pressure is running on the low side today.  This may be secondary to her ongoing issues with atrial fibrillation and could be contributing to her weakness that she is concerned with. - BMP8+EGFR - Hepatic function panel  3. Iron deficiency anemia, unspecified iron deficiency anemia type -Check CBC - Anemia Profile B  4. Vitamin D deficiency -Continue with vitamin D replacement pending results of lab work - VITAMIN D 25 Hydroxy (Vit-D Deficiency, Fractures)  5. Paroxysmal atrial fibrillation (HCC) -Patient is in atrial fibrillation at 96 to 108/min.  6. Gastroesophageal reflux  disease, esophagitis presence not specified Continue with pantoprazole - Hepatic function panel  7. Weakness -This weakness may certainly be due to her atrial fib.  She was last seen by the cardiologist her pulse was 120.  This was on June 17. - Vitamin B12 - Thyroid Panel With TSH - Urine Culture - Urinalysis, Complete  8. Chronic diastolic CHF (congestive heart failure) ([place]) -Continue follow-up with cardiology  9. Chronic back pain greater than 3 months duration -Continue follow-up with orthopedics.  Meds ordered this encounter  Medications  . potassium chloride SA (K-DUR,KLOR-CON) 20 MEQ tablet    Sig: Take 1 tablet (20 mEq total) by mouth daily.    Dispense:  90 tablet    Refill:  3   Patient Instructions  Medicare Annual Wellness Visit  [facility] and the medical providers at [facility] strive to bring you the best medical care.  In doing so we not only want to address your current medical conditions and concerns but also to detect new conditions early and prevent illness, disease and health-related problems.    Medicare offers a yearly Wellness Visit which allows our clinical staff to assess your need for preventative services including immunizations, lifestyle education, counseling to decrease risk of preventable diseases and screening for fall risk and other medical concerns.    This visit is provided free of charge (no copay) for all Medicare recipients. The clinical pharmacists at [facility] have begun to conduct these Wellness Visits which will also include a thorough review of all your medications.    As you primary medical provider recommend that you make an appointment for your Annual Wellness Visit if you have not done so already this year.  You may set up this appointment before you leave today or you may call back ([phone]) and schedule an appointment.  Please make sure when you call that you  mention that you are scheduling your Annual Wellness Visit with the clinical pharmacist so that the appointment may be made for the proper length of time.     Continue current medications. Continue good therapeutic lifestyle changes which include good diet and exercise. Fall precautions discussed with patient. If an FOBT was given today- please return it to our front desk. If you are over 69 years old - you may need Prevnar 66 or the adult Pneumonia vaccine.  **Flu shots are available--- please call and schedule a FLU-CLINIC appointment**  After your visit with [place] today you will receive a survey in the mail or online from [employer] regarding your care with [place]. Please take a moment to fill this out. Your feedback is very important to [place] as you can help [place] better understand your patient needs as well as improve your experience and satisfaction. WE CARE ABOUT YOU!!!   The patient should make every effort to drink lots of water every day And should limit if possible the intake of excessive caffeine She should avoid sodium intake if possible other than what is cooked in the food She should continue to be careful with standing and avoid any climbing activity. Follow-up with cardiology as planned    Arrie Senate MD

## 2018-06-21 NOTE — Patient Instructions (Addendum)
Medicare Annual Wellness Visit  [facility] and the medical providers at [facility] strive to bring you the best medical care.  In doing so we not only want to address your current medical conditions and concerns but also to detect new conditions early and prevent illness, disease and health-related problems.    Medicare offers a yearly Wellness Visit which allows our clinical staff to assess your need for preventative services including immunizations, lifestyle education, counseling to decrease risk of preventable diseases and screening for fall risk and other medical concerns.    This visit is provided free of charge (no copay) for all Medicare recipients. The clinical pharmacists at [facility] have begun to conduct these Wellness Visits which will also include a thorough review of all your medications.    As you primary medical provider recommend that you make an appointment for your Annual Wellness Visit if you have not done so already this year.  You may set up this appointment before you leave today or you may call back ([phone]) and schedule an appointment.  Please make sure when you call that you mention that you are scheduling your Annual Wellness Visit with the clinical pharmacist so that the appointment may be made for the proper length of time.     Continue current medications. Continue good therapeutic lifestyle changes which include good diet and exercise. Fall precautions discussed with patient. If an FOBT was given today- please return it to our front desk. If you are over 33 years old - you may need Prevnar 57 or the adult Pneumonia vaccine.  **Flu shots are available--- please call and schedule a FLU-CLINIC appointment**  After your visit with [place] today you will receive a survey in the mail or online from [employer] regarding your care with [place]. Please take a moment to fill this out. Your feedback is very  important to [place] as you can help [place] better understand your patient needs as well as improve your experience and satisfaction. WE CARE ABOUT YOU!!!   The patient should make every effort to drink lots of water every day And should limit if possible the intake of excessive caffeine She should avoid sodium intake if possible other than what is cooked in the food She should continue to be careful with standing and avoid any climbing activity. Follow-up with cardiology as planned

## 2018-06-22 LAB — BMP8+EGFR
BUN/Creatinine Ratio: 15 (ref 12–28)
BUN: 16 mg/dL (ref 8–27)
CO2: 28 mmol/L (ref 20–29)
Calcium: 10.1 mg/dL (ref 8.7–10.3)
Chloride: 95 mmol/L — ABNORMAL LOW (ref 96–106)
Creatinine, Ser: 1.04 mg/dL — ABNORMAL HIGH (ref 0.57–1.00)
GFR calc Af Amer: 59 mL/min/{1.73_m2} — ABNORMAL LOW (ref >59)
GFR, EST NON AFRICAN AMERICAN: 52 mL/min/{1.73_m2} — AB (ref >59)
Glucose: 118 mg/dL — ABNORMAL HIGH (ref 65–99)
Potassium: 5.4 mmol/L — ABNORMAL HIGH (ref 3.5–5.2)
Sodium: 141 mmol/L (ref 134–144)

## 2018-06-22 LAB — ANEMIA PROFILE B
Basophils Absolute: 0 10*3/uL (ref 0.0–0.2)
Basos: 0 %
EOS (ABSOLUTE): 0.1 10*3/uL (ref 0.0–0.4)
EOS: 1 %
Ferritin: 122 ng/mL (ref 15–150)
Folate: >20 ng/mL (ref >3.0)
HEMATOCRIT: 44.2 % (ref 34.0–46.6)
Hemoglobin: 14.2 g/dL (ref 11.1–15.9)
IMMATURE GRANS (ABS): 0 10*3/uL (ref 0.0–0.1)
IMMATURE GRANULOCYTES: 0 %
IRON SATURATION: 28 % (ref 15–55)
IRON: 88 ug/dL (ref 27–139)
LYMPHS: 32 %
Lymphocytes Absolute: 2.9 10*3/uL (ref 0.7–3.1)
MCH: 30.3 pg (ref 26.6–33.0)
MCHC: 32.1 g/dL (ref 31.5–35.7)
MCV: 94 fL (ref 79–97)
MONOCYTES: 10 %
Monocytes Absolute: 0.9 10*3/uL (ref 0.1–0.9)
Neutrophils Absolute: 5.1 10*3/uL (ref 1.4–7.0)
Neutrophils: 57 %
PLATELETS: 259 10*3/uL (ref 150–450)
RBC: 4.69 x10E6/uL (ref 3.77–5.28)
RDW: 14.3 % (ref 12.3–15.4)
Retic Ct Pct: 1.8 % (ref 0.6–2.6)
Total Iron Binding Capacity: 314 ug/dL (ref 250–450)
UIBC: 226 ug/dL (ref 118–369)
Vitamin B-12: 1295 pg/mL — ABNORMAL HIGH (ref 232–1245)
WBC: 9.1 10*3/uL (ref 3.4–10.8)

## 2018-06-22 LAB — THYROID PANEL WITH TSH
Free Thyroxine Index: 2.7 (ref 1.2–4.9)
T3 UPTAKE RATIO: 32 % (ref 24–39)
T4 TOTAL: 8.4 ug/dL (ref 4.5–12.0)
TSH: 3.17 u[IU]/mL (ref 0.450–4.500)

## 2018-06-22 LAB — LIPID PANEL
CHOL/HDL RATIO: 2.2 ratio (ref 0.0–4.4)
Cholesterol, Total: 147 mg/dL (ref 100–199)
HDL: 66 mg/dL (ref >39)
LDL Calculated: 57 mg/dL (ref 0–99)
Triglycerides: 122 mg/dL (ref 0–149)
VLDL Cholesterol Cal: 24 mg/dL (ref 5–40)

## 2018-06-22 LAB — HEPATIC FUNCTION PANEL
ALT: 20 IU/L (ref 0–32)
AST: 25 IU/L (ref 0–40)
Albumin: 4.4 g/dL (ref 3.5–4.8)
Alkaline Phosphatase: 111 IU/L (ref 39–117)
BILIRUBIN TOTAL: 0.4 mg/dL (ref 0.0–1.2)
BILIRUBIN, DIRECT: 0.17 mg/dL (ref 0.00–0.40)
Total Protein: 6.7 g/dL (ref 6.0–8.5)

## 2018-06-22 LAB — VITAMIN D 25 HYDROXY (VIT D DEFICIENCY, FRACTURES): VIT D 25 HYDROXY: 49.1 ng/mL (ref 30.0–100.0)

## 2018-06-23 LAB — URINE CULTURE

## 2018-06-24 MED ORDER — AMOXICILLIN 500 MG PO CAPS: 500.0000 mg | ORAL_CAPSULE | Freq: Three times a day (TID) | ORAL | 0 refills | Status: DC

## 2018-06-24 NOTE — Addendum Note (Signed)
Addended byCarrolyn Leigh on: 06/24/2018 08:54 AM   Modules accepted: Orders

## 2018-06-27 DIAGNOSIS — L900 Lichen sclerosus et atrophicus: Secondary | ICD-10-CM | POA: Diagnosis not present

## 2018-07-11 ENCOUNTER — Other Ambulatory Visit: Payer: Self-pay | Admitting: Family Medicine

## 2018-07-11 MED ORDER — ROSUVASTATIN CALCIUM 10 MG PO TABS: 10.0000 mg | ORAL_TABLET | ORAL | 1 refills | Status: DC

## 2018-07-11 NOTE — Telephone Encounter (Signed)
Pt aware refill sent to pharmacy 

## 2018-07-20 ENCOUNTER — Other Ambulatory Visit: Payer: Self-pay | Admitting: *Deleted

## 2018-07-20 MED ORDER — FLUOXETINE HCL 40 MG PO CAPS: 40.0000 mg | ORAL_CAPSULE | Freq: Every day | ORAL | 3 refills | Status: DC

## 2018-07-22 DIAGNOSIS — H40013 Open angle with borderline findings, low risk, bilateral: Secondary | ICD-10-CM | POA: Diagnosis not present

## 2018-07-23 NOTE — Progress Notes (Signed)
[facility] FOLLOW UP NOTE  Patient Care Team: [name], MD as PCP - Cyndia Diver, MD as PCP - Cardiology (Cardiology) Suella Broad, MD (Physical Medicine and Rehabilitation) Hillary Bow, MD (Cardiology) Newton Pigg, MD (Obstetrics and Gynecology)  CHIEF COMPLAINTS Follow up anemia and MGUS   INITIAL PRESENTATION (first consult):  [name] [name] 77 y.o. female is being referred by Dr. Deatra Ina because of anemia.   She had lumbar and thoracic spine fusion surgery for kyphosis in Feb 2015 and Sep 2015, and required blood transfusion 2u each time after surgery. She was discharged to rehab after surgery, and she received 2u RBC in Nov 4th, 2015 for Hb 7.8g/dl. She was fatigued when her Hg was low, but still able to do some rehab. She had multiple full in the past few month and was hospitalized last month. She still has diffuse body pain from the recent fall, quite fatigued, is only able to walk a short distance, (+) dyspnea and chest heaviness on exertion. No nausea, no change of her bowel habits. Her appetite is moderate to low, she lost about 15-20lbs in the past year.  She denied any bleeding episodes including hematochezia, melana, hemoptysis, hematuria or epitaxis. No mucosal bleeding or easy bruising. She was finally discharged home on 10/30/14, still getting home PT/OT. She came in today with a wheelchair.   Per her daughter, she has had intermittent confusion and anxiety lately, she seems to have some cognitive function dificiency also. Pt her self contributes to some of her medications.   She has last colonoscopy about 3 years ago, she probably had polyps removed. Her last mammogram was one year ago which was normal. Per her daughter, she has a normal CT abdomen and pelvis about 5-6 month ago which was ordered for some abdominal symptoms. She has been on iron pill (1 daily) a few weeks ago.    CURRENT THERAPY: Observation    INTERVAL HISTORY  Ms  Willadsen returns for follow up. She is here with a family member. She is feeling fine but is feeling weak. She uses a walker and cannot walk long distances.  No CP. She says she feels weak, not tired or fatigued. She is not motivated to have PT at home. She feels depressed due to her limited mobility and would like to be able to move around the house and leave the house freely. She is on Prozac, but she thinks that it's not helping. She still has crying spells.    MEDICAL HISTORY:  Past Medical History  Diagnosis Date  . Obesity   . Atrial fibrillation    . CTS (carpal tunnel syndrome)   . Diarrhea   . IBS (irritable bowel syndrome)   . Diverticulitis of colon   . Gastritis   . Esophageal stricture, s/p dilation    . Personal history of colonic polyps 10/25/2011 & 12/02/11    not retrieved Dr Lyla Son & tubular adenomas  . Hiatal hernia   . GERD (gastroesophageal reflux disease)   . Obstructive sleep apnea   . Insomnia   . HTN (hypertension)   . Hyperlipidemia   . Depression/anxiety    . DIASTOLIC HEART FAILURE, CHRONIC 04/04/2009    Qualifier: Diagnosis of  By: Mare Ferrari, RMA, Sherri      SURGICAL HISTORY: Past Surgical History:  Procedure Laterality Date  . BACK SURGERY    . BONE MARROW BIOPSY  11/2014  . CARDIAC CATHETERIZATION  12/27/2014   Procedure: INTRAVASCULAR  PRESSURE WIRE/FFR STUDY;  Surgeon: Larey Dresser, MD;  Location: [facility] CATH LAB;  Service: Cardiovascular;;  . CARDIAC CATHETERIZATION N/A 11/25/2016   Procedure: Left Heart Cath and Coronary Angiography;  Surgeon: Sherren Mocha, MD;  Location: [facility] CV LAB;  Service: Cardiovascular;  Laterality: N/A;  . CARDIOVERSION N/A 02/13/2016   Procedure: CARDIOVERSION;  Surgeon: Josue Hector, MD;  Location: [facility] ENDOSCOPY;  Service: Cardiovascular;  Laterality: N/A;  . CARPAL TUNNEL RELEASE Right 1980's  . CATARACT EXTRACTION Bilateral   . CATARACT EXTRACTION W/ INTRAOCULAR LENS  IMPLANT, BILATERAL  2000's  . CORONARY  ANGIOPLASTY WITH STENT PLACEMENT  01/07/2015   "2"  . CORONARY STENT PLACEMENT    . DILATION AND CURETTAGE OF UTERUS    . ESOPHAGOGASTRODUODENOSCOPY (EGD) WITH ESOPHAGEAL DILATION  "several times"  . JOINT REPLACEMENT    . KNEE ARTHROSCOPY Left 1995  . LAPAROSCOPIC CHOLECYSTECTOMY  2003  . LEFT HEART CATHETERIZATION WITH CORONARY ANGIOGRAM N/A 12/27/2014   Procedure: LEFT HEART CATHETERIZATION WITH CORONARY ANGIOGRAM;  Surgeon: Larey Dresser, MD;  Location: [facility] CATH LAB;  Service: Cardiovascular;  Laterality: N/A;  . PERCUTANEOUS CORONARY ROTOBLATOR INTERVENTION (PCI-R)  01/07/2015  . PERCUTANEOUS CORONARY ROTOBLATOR INTERVENTION (PCI-R) N/A 01/07/2015   Procedure: PERCUTANEOUS CORONARY ROTOBLATOR INTERVENTION (PCI-R);  Surgeon: Blane Ohara, MD;  Location: [facility] CATH LAB;  Service: Cardiovascular;  Laterality: N/A;  . POSTERIOR FUSION THORACIC SPINE  08/2015  . POSTERIOR LUMBAR FUSION  01/2015  . TOTAL KNEE ARTHROPLASTY Bilateral 1990's - 2000's    SOCIAL HISTORY: Social History   Socioeconomic History  . Marital status: Widowed    Spouse name: Not on file  . Number of children: 2  . Years of education: HS  . Highest education level: Not on file  Occupational History  . Occupation: Licensed conveyancer- retired    Comment: retired  Scientific laboratory technician  . Financial resource strain: Not on file  . Food insecurity:    Worry: Not on file    Inability: Not on file  . Transportation needs:    Medical: Not on file    Non-medical: Not on file  Tobacco Use  . Smoking status: Never Smoker  . Smokeless tobacco: Never Used  Substance and Sexual Activity  . Alcohol use: No    Comment: 01/07/2015 "glass of wine at Christmas, maybe"  . Drug use: No  . Sexual activity: Never  Lifestyle  . Physical activity:    Days per week: Not on file    Minutes per session: Not on file  . Stress: Not on file  Relationships  . Social connections:    Talks on phone: Not on file    Gets together: Not on file     Attends religious service: Not on file    Active member of club or organization: Not on file    Attends meetings of clubs or organizations: Not on file    Relationship status: Not on file  . Intimate partner violence:    Fear of current or ex partner: Not on file    Emotionally abused: Not on file    Physically abused: Not on file    Forced sexual activity: Not on file  Other Topics Concern  . Not on file  Social History Narrative   Patient is right handed   Patient drinks 1-2 sodas daily.   patlient lives alone.    FAMILY HISTORY: Family History  Problem Relation Age of Onset  . Coronary artery disease Father   . Peripheral  vascular disease Father   . Coronary artery disease Mother   . Coronary artery disease Brother   . Colon cancer Sister at 24   . Emphysema Sister   . Sleep apnea Son   . Colon cancer Sister at 18     spread to her brain  No family history of blood disorders or other malignancy   ALLERGIES:  is allergic to buprenorphine hcl; cymbalta [duloxetine hcl]; morphine and related; oxycodone-acetaminophen; valium [diazepam]; statins; altace [ramipril]; floxin [ofloxacin]; hydromorphone; lovaza [omega-3-acid ethyl esters]; trilipix [choline fenofibrate]; and zetia [ezetimibe].  MEDICATIONS:  Current Outpatient Medications  Medication Sig Dispense Refill  . aspirin EC 81 MG tablet Take 1 tablet (81 mg total) by mouth daily. 90 tablet 3  . Calcium Citrate-Vitamin D (CALCIUM + D PO) Take 1 tablet by mouth daily.    . Cholecalciferol (VITAMIN D) 2000 units tablet Take 2,000 Units by mouth daily.    . clobetasol cream (TEMOVATE) 9.83 % Apply 1 application topically See admin instructions. Applies to vaginal area twice a week.    . diltiazem (CARDIZEM CD) 120 MG 24 hr capsule Take 1 capsule (120 mg total) by mouth 2 (two) times daily. 180 capsule 1  . diphenoxylate-atropine (LOMOTIL) 2.5-0.025 MG per tablet Take 1 tablet by mouth 4 (four) times daily as needed for  diarrhea or loose stools.     Marland Kitchen ELIQUIS 5 MG TABS tablet Take 1 tablet (5 mg total) by mouth 2 (two) times daily. 180 tablet 2  . Eszopiclone 3 MG TABS Take 1 tablet (3 mg total) by mouth at bedtime. For insomnia,take immediately before bedtime. 90 tablet 1  . fentaNYL (DURAGESIC - DOSED MCG/HR) 50 MCG/HR Place 1 patch (50 mcg total) onto the skin every 3 (three) days. 10 patch 0  . FLUoxetine (PROZAC) 40 MG capsule Take 1 capsule (40 mg total) by mouth daily. 90 capsule 3  . furosemide (LASIX) 40 MG tablet TAKE 1.5 TABLETS (60 MG TOTAL) BY MOUTH DAILY 135 tablet 3  . HYDROcodone-acetaminophen (NORCO) 10-325 MG per tablet Take 1 tablet by mouth every 4 (four) hours as needed for moderate pain.     Marland Kitchen LORazepam (ATIVAN) 0.5 MG tablet Take 1 tablet (0.5 mg total) by mouth 2 (two) times daily as needed for anxiety. 60 tablet 1  . meclizine (ANTIVERT) 25 MG tablet Take 1 tablet (25 mg total) by mouth 3 (three) times daily as needed for dizziness. 60 tablet 0  . methocarbamol (ROBAXIN) 500 MG tablet Take 500 mg by mouth 4 (four) times daily as needed for muscle spasms.    . Multiple Vitamin (MULTIVITAMIN) tablet Take 1 tablet by mouth daily. For supplement    . nitroGLYCERIN (NITROSTAT) 0.4 MG SL tablet Place 1 tablet (0.4 mg total) under the tongue every 5 (five) minutes as needed for chest pain (up to 3 doses). 25 tablet 3  . pantoprazole (PROTONIX) 40 MG tablet TAKE 1 TABLET TWICE A DAY 180 tablet 0  . potassium chloride SA (K-DUR,KLOR-CON) 20 MEQ tablet Take 1 tablet (20 mEq total) by mouth daily. 90 tablet 3  . rosuvastatin (CRESTOR) 10 MG tablet Take 1 tablet (10 mg total) by mouth as directed. 90 tablet 1   No current facility-administered medications for this visit.     REVIEW OF SYSTEMS:  Constitutional: Denies fevers, chills or abnormal night sweats (+) increased fatigue and weakness (+) uses walker with chair to ambulate even at home Eyes: Denies blurriness of vision, double vision or  watery  eyes Ears, nose, mouth, throat, and face: Denies mucositis or sore throat Respiratory: Denies cough, dyspnea or wheezes Cardiovascular: Denies palpitation, chest discomfort or lower extremity swelling Gastrointestinal:  Denies nausea, heartburn or change in bowel habits Skin: Denies abnormal skin rashes Lymphatics: Denies new lymphadenopathy or easy bruising Neurological:Denies numbness, tingling or new weaknesses, (+) intermittent confusion,  Musculoskeletal: (+) chronic back pain  Behavioral/Psych: (+) anxiety, no new changes (+) depressed All other systems were reviewed with the patient and are negative.  PHYSICAL EXAMINATION:  Vitals:   07/25/18 1450  BP: 112/90  Pulse: 75  Resp: 18  Temp: 98.3 F (36.8 C)  SpO2: 98%   Filed Weights   07/25/18 1450  Weight: 196 lb 8 oz (89.1 kg)    GENERAL:alert, no distress and comfortable, (+) uses a walker with a chair to rest if needed (+) limited mobility  SKIN: skin color, texture, turgor are normal, no rashes or significant lesions EYES: normal, conjunctiva are pink and non-injected, sclera clear OROPHARYNX:no exudate, no erythema and lips, buccal mucosa, and tongue normal  NECK: supple, thyroid normal size, non-tender, without nodularity LYMPH:  no palpable lymphadenopathy in the cervical, axillary or inguinal LUNGS: clear to auscultation and percussion with normal breathing effort HEART: regular rate & rhythm and no murmurs and no lower extremity edema ABDOMEN:abdomen soft, non-tender and normal bowel sounds Musculoskeletal:no cyanosis of digits and no clubbing. (+) surgical scar at her thoracic and lumbar spine  PSYCH: alert & oriented x 3 with fluent speech, but argues with her daughter a lot (+) depressed and not motivated NEURO: no focal motor/sensory deficits  LABORATORY DATA:  CBC Latest Ref Rng & Units 07/25/2018 06/21/2018 02/15/2018  WBC 3.9 - 10.3 K/uL 6.2 9.1 6.6  Hemoglobin 11.6 - 15.9 g/dL 12.8 14.2 12.5   Hematocrit 34.8 - 46.6 % 39.4 44.2 38.5  Platelets 145 - 400 K/uL 193 259 246   CMP Latest Ref Rng & Units 07/25/2018 06/21/2018 02/15/2018  Glucose 70 - 99 mg/dL 144(H) 118(H) 113(H)  BUN 8 - 23 mg/dL 12 16 13   Creatinine 0.44 - 1.00 mg/dL 0.90 1.04(H) 0.92  Sodium 135 - 145 mmol/L 139 141 144  Potassium 3.5 - 5.1 mmol/L 4.3 5.4(H) 4.4  Chloride 98 - 111 mmol/L 100 95(L) 99  CO2 22 - 32 mmol/L 28 28 26   Calcium 8.9 - 10.3 mg/dL 9.2 10.1 9.6  Total Protein 6.5 - 8.1 g/dL 6.6 6.7 6.4  Total Bilirubin 0.3 - 1.2 mg/dL 0.5 0.4 0.4  Alkaline Phos 38 - 126 U/L 87 111 113  AST 15 - 41 U/L 21 25 25   ALT 0 - 44 U/L 20 20 21     Results for Mcree, KAZIYAH PARKISON (MRN [id number]) as of 07/23/2018 17:16  Ref. Range 01/01/2017 14:04 07/22/2017 14:44 01/24/2018 14:33  Ig Kappa Free Light Chain Latest Ref Range: 3.3 - 19.4 mg/L 25.6 (H) 27.1 (H)   Ig Lambda Free Light Chain Latest Ref Range: 5.7 - 26.3 mg/L 25.1 28.4 (H)   Kappa/Lambda FluidC Ratio Latest Ref Range: 0.26 - 1.65  1.02 0.95   Kappa free light chain Latest Ref Range: 3.3 - 19.4 mg/L   30.3 (H)  Lamda free light chains Latest Ref Range: 5.7 - 26.3 mg/L   27.6 (H)  Kappa, lamda light chain ratio Latest Ref Range: 0.26 - 1.65    1.10    Results for Castrillon, LADYE MACNAUGHTON (MRN [id number]) as of 07/23/2018 17:16  Ref. Range 01/02/2016 13:11 07/02/2016 12:50 01/01/2017 14:10 01/24/2018  14:33  M Protein SerPl Elph-Mcnc Latest Ref Range: Not Observed g/dL 0.2 (H) 0.3 (H) Not Observed 0.2 (H)    SURGICAL PATH 12/07/14 Bone Marrow, Aspirate,Biopsy, and Clot, right post iliac - HYPERCELLULAR BONE MARROW FOR AGE WITH TRILINEAGE HEMATOPOIESIS AND 5% PLASMA CELLS. - SEE COMMENT. PERIPHERAL BLOOD: - NORMOCYTIC-NORMOCHROMIC ANEMIA. Diagnosis Note The bone marrow is hypercellular for age with trilineage hematopoiesis and essentially orderly and progressive maturation of all myeloid cell types. The plasma cells represent 5% of all cells associated with occasional small  clusters. Immunohistochemical stains apparently show polyclonal staining pattern in plasma cells for kappa and lambda light chains. The appearance is non specific and not diagnostic of plasma cell dyscrasia/neoplasm. Correlation  Cytogenetics: normal    RADIOGRAPHIC STUDIES: I have personally reviewed the radiological images as listed and agreed with the findings in the report.   Ct Head Wo Contrast 10/27/2014   IMPRESSION: No acute intracranial abnormalities. Chronic atrophy and small vessel ischemic changes.     Ct Cervical Spine Wo Contrast 10/27/2014 IMPRESSION:  1. No evidence of acute fracture or malalignment.  2. Multilevel cervical spondylosis and facet arthropathy without focality.  3. Atherosclerotic vascular calcifications in both carotid arteries.      ASSESSMENT & PLAN:  Ms Chambers is a 77 y.o.  female with multiple comorbidities, who presents with moderate persistent anemia after her spinal fusion surgery which required blood transfusion 3 times.  1. Anemia of iron deficiency, resolved now  -I previously reviewed her lab result with her and  Her daughter. No lab evidence of megaloblastic anemia, or hemolysis.   -She has normal iron level and ferritin, but low iron saturation and elevated soluble transferrin receptor, and history of very low ferritin in the past (<10), which support IDA. Her bone marrow biopsy also showed low iron staining. -Anemia has resolved. Her CBC is normal today. Repeated ferritin is 79, normal. She is off oral iron now -We'll continue monitor her CBC periodically. -She still feels tired and fatigued with limited mobility. She reports feeling down and depressed, and I suspect depression might contribute to her symptoms and lack of motivation  -She is on Prozac for depression, but she is still having symptoms. I recommend she talks to her PCP for her depression.   2. MGUS, IgG  -SPEP showed low level M-protein (0.3), immunofixation was consistent with  IgG lambda protein.  Quantitative immunoglobulin level and light chain levels were grossly normal. -I reviewed her bone marrow biopsy results, which showed 5% plasma cell, no monoclonal light chain deposition. -she has MGUS. No evidence of multiple myeloma. -Her CBC and CMP are normal, no clinical concern for disease progression to multiple myeloma   3. Dementia, anxiety -She could not tolerate Aricept. Follow up with neurologist.  4. HTN, AF, dyslipidemia  -follow up with PCP and cardiologist  5. Fatigue -Probably related to her sedentary lifestyle and multiple comorbidities, I strongly encouraged her to consider physical therapy. She is reluctant to go to PT or have PT at home. She feels weak overall   6. Depression - She is feeling down and depressed with crying spells - She is on Prozac but is still having symptoms. - I suspect her lack of motivation and fatigue could be related to depression. -I recommend she follows up wit her PCP for any mood disorder mediation adjustment   Plan - labs in 6 months and I will call her with the results  -follow up in the office in 1 year with lab  All questions were answered. The patient knows to call the clinic with any problems, questions or concerns.  I spent 15 minutes counseling the patient face to face. The total time spent in the appointment was 20 minutes and more than 50% was on counseling.   Dierdre Searles Dweik am acting as scribe for Dr. Truitt Merle.  I have reviewed the above documentation for accuracy and completeness, and I agree with the above.      Truitt Merle, MD 07/25/2018

## 2018-07-25 ENCOUNTER — Inpatient Hospital Stay: Payer: Medicare HMO | Admitting: Hematology

## 2018-07-25 ENCOUNTER — Telehealth: Payer: Self-pay

## 2018-07-25 ENCOUNTER — Inpatient Hospital Stay: Payer: Medicare HMO | Attending: Hematology

## 2018-07-25 ENCOUNTER — Encounter: Payer: Self-pay | Admitting: Hematology

## 2018-07-25 VITALS — BP 112/90 | HR 75 | Temp 98.3°F | Resp 18 | Ht 67.0 in | Wt 196.5 lb

## 2018-07-25 DIAGNOSIS — D472 Monoclonal gammopathy: Secondary | ICD-10-CM

## 2018-07-25 DIAGNOSIS — I5032 Chronic diastolic (congestive) heart failure: Secondary | ICD-10-CM

## 2018-07-25 DIAGNOSIS — I110 Hypertensive heart disease with heart failure: Secondary | ICD-10-CM

## 2018-07-25 DIAGNOSIS — F0390 Unspecified dementia without behavioral disturbance: Secondary | ICD-10-CM

## 2018-07-25 DIAGNOSIS — R531 Weakness: Secondary | ICD-10-CM

## 2018-07-25 DIAGNOSIS — F329 Major depressive disorder, single episode, unspecified: Secondary | ICD-10-CM | POA: Insufficient documentation

## 2018-07-25 DIAGNOSIS — E785 Hyperlipidemia, unspecified: Secondary | ICD-10-CM | POA: Diagnosis not present

## 2018-07-25 DIAGNOSIS — Z862 Personal history of diseases of the blood and blood-forming organs and certain disorders involving the immune mechanism: Secondary | ICD-10-CM | POA: Diagnosis not present

## 2018-07-25 DIAGNOSIS — D509 Iron deficiency anemia, unspecified: Secondary | ICD-10-CM

## 2018-07-25 DIAGNOSIS — R69 Illness, unspecified: Secondary | ICD-10-CM | POA: Diagnosis not present

## 2018-07-25 LAB — CBC WITH DIFFERENTIAL (CANCER CENTER ONLY)
BASOS ABS: 0 10*3/uL (ref 0.0–0.1)
BASOS PCT: 0 %
EOS PCT: 2 %
Eosinophils Absolute: 0.1 10*3/uL (ref 0.0–0.5)
HEMATOCRIT: 39.4 % (ref 34.8–46.6)
Hemoglobin: 12.8 g/dL (ref 11.6–15.9)
Lymphocytes Relative: 28 %
Lymphs Abs: 1.8 10*3/uL (ref 0.9–3.3)
MCH: 30.8 pg (ref 25.1–34.0)
MCHC: 32.5 g/dL (ref 31.5–36.0)
MCV: 94.7 fL (ref 79.5–101.0)
Monocytes Absolute: 0.4 10*3/uL (ref 0.1–0.9)
Monocytes Relative: 7 %
NEUTROS ABS: 3.9 10*3/uL (ref 1.5–6.5)
Neutrophils Relative %: 63 %
PLATELETS: 193 10*3/uL (ref 145–400)
RBC: 4.16 MIL/uL (ref 3.70–5.45)
RDW: 13.6 % (ref 11.2–14.5)
WBC: 6.2 10*3/uL (ref 3.9–10.3)

## 2018-07-25 LAB — CMP (CANCER CENTER ONLY)
ALBUMIN: 3.6 g/dL (ref 3.5–5.0)
ALT: 20 U/L (ref 0–44)
AST: 21 U/L (ref 15–41)
Alkaline Phosphatase: 87 U/L (ref 38–126)
Anion gap: 11 (ref 5–15)
BUN: 12 mg/dL (ref 8–23)
CO2: 28 mmol/L (ref 22–32)
CREATININE: 0.9 mg/dL (ref 0.44–1.00)
Calcium: 9.2 mg/dL (ref 8.9–10.3)
Chloride: 100 mmol/L (ref 98–111)
GFR, EST NON AFRICAN AMERICAN: 60 mL/min — AB (ref >60)
GFR, Est AFR Am: >60 mL/min (ref >60)
GLUCOSE: 144 mg/dL — AB (ref 70–99)
Potassium: 4.3 mmol/L (ref 3.5–5.1)
Sodium: 139 mmol/L (ref 135–145)
Total Bilirubin: 0.5 mg/dL (ref 0.3–1.2)
Total Protein: 6.6 g/dL (ref 6.5–8.1)

## 2018-07-25 LAB — FERRITIN: Ferritin: 79 ng/mL (ref 11–307)

## 2018-07-25 NOTE — Telephone Encounter (Signed)
Printed Avs and calender of upcoming appointment. Per 8/5 los

## 2018-07-26 LAB — PROTEIN ELECTROPHORESIS, SERUM, WITH REFLEX
A/G RATIO SPE: 1.1 (ref 0.7–1.7)
ALBUMIN ELP: 3.2 g/dL (ref 2.9–4.4)
Alpha-1-Globulin: 0.2 g/dL (ref 0.0–0.4)
Alpha-2-Globulin: 0.8 g/dL (ref 0.4–1.0)
BETA GLOBULIN: 0.9 g/dL (ref 0.7–1.3)
Gamma Globulin: 0.9 g/dL (ref 0.4–1.8)
Globulin, Total: 2.8 g/dL (ref 2.2–3.9)
Total Protein ELP: 6 g/dL (ref 6.0–8.5)

## 2018-07-26 LAB — KAPPA/LAMBDA LIGHT CHAINS
Kappa free light chain: 30.4 mg/L — ABNORMAL HIGH (ref 3.3–19.4)
Kappa, lambda light chain ratio: 0.92 (ref 0.26–1.65)
Lambda free light chains: 33.2 mg/L — ABNORMAL HIGH (ref 5.7–26.3)

## 2018-07-28 ENCOUNTER — Telehealth: Payer: Self-pay

## 2018-07-28 NOTE — Telephone Encounter (Signed)
-----   Message from Truitt Merle, MD sent at 07/27/2018  7:12 AM EDT ----- Please let pt or her daughter know her lab results: M-protein negative, iron level adequate, no concerns. Thanks  Truitt Merle  07/27/2018

## 2018-07-28 NOTE — Telephone Encounter (Signed)
Spoke with patient's daughter per Dr. Burr Medico M-protein negative, iron level good, no concerns.  She verbalized an understanding.

## 2018-08-18 ENCOUNTER — Other Ambulatory Visit: Payer: Self-pay | Admitting: Family Medicine

## 2018-08-18 ENCOUNTER — Other Ambulatory Visit: Payer: Self-pay

## 2018-08-18 DIAGNOSIS — I480 Paroxysmal atrial fibrillation: Secondary | ICD-10-CM

## 2018-08-18 DIAGNOSIS — I2510 Atherosclerotic heart disease of native coronary artery without angina pectoris: Secondary | ICD-10-CM

## 2018-08-18 MED ORDER — PANTOPRAZOLE SODIUM 40 MG PO TBEC: 40.0000 mg | DELAYED_RELEASE_TABLET | Freq: Two times a day (BID) | ORAL | 0 refills | Status: DC

## 2018-08-19 NOTE — Telephone Encounter (Signed)
Dr Laurance Flatten can you sign this: Set up for you.

## 2018-08-20 MED ORDER — ESZOPICLONE 3 MG PO TABS: 3.0000 mg | ORAL_TABLET | Freq: Every day | ORAL | 1 refills | Status: DC

## 2018-08-23 ENCOUNTER — Other Ambulatory Visit: Payer: Self-pay

## 2018-08-23 DIAGNOSIS — I480 Paroxysmal atrial fibrillation: Secondary | ICD-10-CM

## 2018-08-23 DIAGNOSIS — I2510 Atherosclerotic heart disease of native coronary artery without angina pectoris: Secondary | ICD-10-CM

## 2018-08-23 MED ORDER — PANTOPRAZOLE SODIUM 40 MG PO TBEC: 40.0000 mg | DELAYED_RELEASE_TABLET | Freq: Two times a day (BID) | ORAL | 0 refills | Status: DC

## 2018-09-30 ENCOUNTER — Telehealth: Payer: Self-pay | Admitting: Family Medicine

## 2018-09-30 NOTE — Telephone Encounter (Signed)
Patient daughter aware of what the chart says.

## 2018-10-03 ENCOUNTER — Ambulatory Visit (INDEPENDENT_AMBULATORY_CARE_PROVIDER_SITE_OTHER): Payer: Medicare HMO | Admitting: *Deleted

## 2018-10-03 DIAGNOSIS — Z23 Encounter for immunization: Secondary | ICD-10-CM

## 2018-10-05 DIAGNOSIS — G894 Chronic pain syndrome: Secondary | ICD-10-CM | POA: Diagnosis not present

## 2018-10-10 DIAGNOSIS — L900 Lichen sclerosus et atrophicus: Secondary | ICD-10-CM | POA: Diagnosis not present

## 2018-10-24 ENCOUNTER — Ambulatory Visit: Payer: Medicare HMO | Admitting: Family Medicine

## 2018-11-02 ENCOUNTER — Ambulatory Visit (INDEPENDENT_AMBULATORY_CARE_PROVIDER_SITE_OTHER): Payer: Medicare HMO

## 2018-11-02 ENCOUNTER — Encounter: Payer: Self-pay | Admitting: Family Medicine

## 2018-11-02 ENCOUNTER — Ambulatory Visit: Payer: Medicare HMO | Admitting: Family Medicine

## 2018-11-02 VITALS — BP 108/68 | HR 60 | Temp 98.2°F | Ht 67.0 in | Wt 199.0 lb

## 2018-11-02 DIAGNOSIS — I480 Paroxysmal atrial fibrillation: Secondary | ICD-10-CM

## 2018-11-02 DIAGNOSIS — K219 Gastro-esophageal reflux disease without esophagitis: Secondary | ICD-10-CM

## 2018-11-02 DIAGNOSIS — E7800 Pure hypercholesterolemia, unspecified: Secondary | ICD-10-CM | POA: Diagnosis not present

## 2018-11-02 DIAGNOSIS — D509 Iron deficiency anemia, unspecified: Secondary | ICD-10-CM

## 2018-11-02 DIAGNOSIS — E559 Vitamin D deficiency, unspecified: Secondary | ICD-10-CM

## 2018-11-02 DIAGNOSIS — I5032 Chronic diastolic (congestive) heart failure: Secondary | ICD-10-CM | POA: Diagnosis not present

## 2018-11-02 DIAGNOSIS — R0602 Shortness of breath: Secondary | ICD-10-CM

## 2018-11-02 DIAGNOSIS — I10 Essential (primary) hypertension: Secondary | ICD-10-CM

## 2018-11-02 DIAGNOSIS — I482 Chronic atrial fibrillation, unspecified: Secondary | ICD-10-CM

## 2018-11-02 DIAGNOSIS — R531 Weakness: Secondary | ICD-10-CM

## 2018-11-02 DIAGNOSIS — G8929 Other chronic pain: Secondary | ICD-10-CM

## 2018-11-02 DIAGNOSIS — I208 Other forms of angina pectoris: Secondary | ICD-10-CM | POA: Diagnosis not present

## 2018-11-02 DIAGNOSIS — M549 Dorsalgia, unspecified: Secondary | ICD-10-CM

## 2018-11-02 MED ORDER — DILTIAZEM HCL ER COATED BEADS 120 MG PO CP24: 120.0000 mg | ORAL_CAPSULE | Freq: Two times a day (BID) | ORAL | 3 refills | Status: DC

## 2018-11-02 NOTE — Patient Instructions (Signed)
Medicare Annual Wellness Visit  [facility] and the medical providers at [facility] strive to bring you the best medical care.  In doing so we not only want to address your current medical conditions and concerns but also to detect new conditions early and prevent illness, disease and health-related problems.    Medicare offers a yearly Wellness Visit which allows our clinical staff to assess your need for preventative services including immunizations, lifestyle education, counseling to decrease risk of preventable diseases and screening for fall risk and other medical concerns.    This visit is provided free of charge (no copay) for all Medicare recipients. The clinical pharmacists at [facility] have begun to conduct these Wellness Visits which will also include a thorough review of all your medications.    As you primary medical provider recommend that you make an appointment for your Annual Wellness Visit if you have not done so already this year.  You may set up this appointment before you leave today or you may call back ([phone]) and schedule an appointment.  Please make sure when you call that you mention that you are scheduling your Annual Wellness Visit with the clinical pharmacist so that the appointment may be made for the proper length of time.     Continue current medications. Continue good therapeutic lifestyle changes which include good diet and exercise. Fall precautions discussed with patient. If an FOBT was given today- please return it to our front desk. If you are over 50 years old - you may need Prevnar 13 or the adult Pneumonia vaccine.  **Flu shots are available--- please call and schedule a FLU-CLINIC appointment**  After your visit with us today you will receive a survey in the mail or online from [employer] regarding your care with us. Please take a moment to fill this out. Your feedback is very  important to us as you can help us better understand your patient needs as well as improve your experience and satisfaction. WE CARE ABOUT YOU!!!    

## 2018-11-02 NOTE — Progress Notes (Signed)
Subjective:    Patient ID: [name] [name], female    DOB: 1941/03/06, 77 y.o.   MRN: [id number]  HPI Pt here for follow up and management of chronic medical problems which includes hypertension and hyperlipidemia. She is taking medication regularly.  Has an echocardiogram planned on December 5 and a visit with a cardiologist on December 9.  She complains of more shortness of breath.  She has not had her fluid pill in a few days.  Her weight today is up a couple of pounds.  She comes with her daughter to the visit today.  She did have an episode of chest pain that was pretty severe to 3 weeks ago when she laid down and had to sit back up.  She typically does not have chest pain with exertion just shortness of breath and this does seem to be getting worse over the past few months.  She denies any cough.  The shortness of breath has generally been getting worse over the past several months.  She has irritable bowel syndrome with alternating constipation and diarrhea but has not seen any blood in the stool or had any black tarry bowel movements and other than the IBS has had no change in her bowel habits.  She is passing her water well but usually this is related to taking the fluid pill which she is not had today.  She typically leaves this off on days when she has to be out of the house.    Patient Active Problem List   Diagnosis Date Noted  . Abnormal nuclear cardiac imaging test   . Chronic diastolic CHF (congestive heart failure) ([place]) 07/17/2015  . Anemia, iron deficiency 07/02/2015  . DDD (degenerative disc disease), lumbar 03/22/2015  . Fall 03/22/2015  . 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 Difficulty hearing 03/22/2015  . Adaptive colitis 03/22/2015  . Lichen 03/47/4259  . Lumbar scoliosis 03/22/2015  . Fracture of pelvis ([place]) 03/22/2015  . Herpes zona 03/22/2015  . History of other specified conditions presenting hazards to health 03/22/2015  . Anemia 01/08/2015  . MGUS (monoclonal  gammopathy of unknown significance) 01/08/2015  . Ischemic chest pain ([place])   . Permanent atrial fibrillation 12/22/2014  . CAD (coronary artery disease) 12/22/2014  . Memory disorder 12/04/2014  . Deficiency anemia 11/10/2014  . Vertigo 10/27/2014  . Angulation of spine 09/18/2014  . Metabolic syndrome 56/38/7564  . HCAP (healthcare-associated pneumonia) 03/27/2014  . S/P spinal fusion 02/23/2014  . Chronic pain associated with significant psychosocial dysfunction 02/23/2014  . Preoperative evaluation of a medical condition to rule out surgical contraindications (TAR required) 10/09/2013  . Abdominal pain 07/06/2013  . Gastroesophageal hernia 07/06/2013  . Incontinence 10/13/2012  . History of IBS 01/26/2012  . 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 EDEMA 06/25/2010  . DEGENERATIVE JOINT DISEASE 04/08/2010  . OTHER DISORDERS OF NERVOUS SYSTEM&SENSE ORGANS 04/08/2010  . Obesity (BMI 30.0-34.9) 04/04/2009  . CIRCADIAN RHYTHM SLEEP D/O DELAY SLEEP PHSE TYPE 04/04/2009  . CARPAL TUNNEL SYNDROME 04/04/2009  . Diarrhea 01/17/2009  . IRRITABLE BOWEL SYNDROME 11/13/2008  . ESOPHAGEAL STRICTURE 11/12/2008  . GERD 11/12/2008  . DUODENITIS, WITHOUT HEMORRHAGE 11/12/2008  . Diaphragmatic hernia without mention of obstruction or gangrene 11/12/2008  . DIVERTICULOSIS, COLON 11/12/2008  . COLONIC POLYPS, HX OF 11/12/2008  . Obstructive sleep apnea 05/23/2008  . DYSPNEA 05/23/2008  . Hyperlipidemia 05/22/2008  . Depression 05/22/2008  . Essential hypertension 05/22/2008  . INSOMNIA 05/22/2008   Outpatient Encounter Medications as of 11/02/2018  Medication Sig  . aspirin EC 81 MG tablet Take 1 tablet (81 mg total) by mouth daily.  . Calcium Citrate-Vitamin D (CALCIUM + D PO) Take 1 tablet by mouth daily.  . Cholecalciferol (VITAMIN D) 2000 units tablet Take 2,000 Units by mouth daily.  . clobetasol cream (TEMOVATE) 6.37 % Apply 1 application topically See admin instructions. Applies to  vaginal area twice a week.  . diltiazem (CARDIZEM CD) 120 MG 24 hr capsule Take 1 capsule (120 mg total) by mouth 2 (two) times daily.  . diphenoxylate-atropine (LOMOTIL) 2.5-0.025 MG per tablet Take 1 tablet by mouth 4 (four) times daily as needed for diarrhea or loose stools.   Marland Kitchen ELIQUIS 5 MG TABS tablet Take 1 tablet (5 mg total) by mouth 2 (two) times daily.  . Eszopiclone 3 MG TABS Take 1 tablet (3 mg total) by mouth at bedtime. For insomnia,take immediately before bedtime.  . fentaNYL (DURAGESIC - DOSED MCG/HR) 50 MCG/HR Place 1 patch (50 mcg total) onto the skin every 3 (three) days.  Marland Kitchen FLUoxetine (PROZAC) 40 MG capsule Take 1 capsule (40 mg total) by mouth daily.  . furosemide (LASIX) 40 MG tablet TAKE 1.5 TABLETS (60 MG TOTAL) BY MOUTH DAILY  . HYDROcodone-acetaminophen (NORCO) 10-325 MG per tablet Take 1 tablet by mouth every 4 (four) hours as needed for moderate pain.   Marland Kitchen LORazepam (ATIVAN) 0.5 MG tablet Take 1 tablet (0.5 mg total) by mouth 2 (two) times daily as needed for anxiety.  . meclizine (ANTIVERT) 25 MG tablet Take 1 tablet (25 mg total) by mouth 3 (three) times daily as needed for dizziness.  . methocarbamol (ROBAXIN) 500 MG tablet Take 500 mg by mouth 4 (four) times daily as needed for muscle spasms.  . Multiple Vitamin (MULTIVITAMIN) tablet Take 1 tablet by mouth daily. For supplement  . nitroGLYCERIN (NITROSTAT) 0.4 MG SL tablet Place 1 tablet (0.4 mg total) under the tongue every 5 (five) minutes as needed for chest pain (up to 3 doses).  . pantoprazole (PROTONIX) 40 MG tablet Take 1 tablet (40 mg total) by mouth 2 (two) times daily.  . potassium chloride SA (K-DUR,KLOR-CON) 20 MEQ tablet Take 1 tablet (20 mEq total) by mouth daily.  . rosuvastatin (CRESTOR) 10 MG tablet Take 1 tablet (10 mg total) by mouth as directed.   No facility-administered encounter medications on file as of 11/02/2018.      Review of Systems  Constitutional: Negative.   HENT: Negative.     Eyes: Negative.   Respiratory: Positive for shortness of breath (worse on exertion ).   Cardiovascular: Negative.   Gastrointestinal: Negative.   Endocrine: Negative.   Genitourinary: Negative.   Musculoskeletal: Negative.   Skin: Negative.   Allergic/Immunologic: Negative.   Neurological: Negative.   Hematological: Negative.   Psychiatric/Behavioral: Negative.        Objective:   Physical Exam  Constitutional: She is oriented to person, place, and time. She appears well-developed and well-nourished. She appears distressed.  Is somewhat distressed today just because of feeling bad having no energy and having increasing problems with shortness of breath.  HENT:  Head: Normocephalic and atraumatic.  Right Ear: External ear normal.  Left Ear: External ear normal.  Nose: Nose normal.  Mouth/Throat: Oropharynx is clear and moist. No oropharyngeal exudate.  Eyes: Pupils are equal, round, and reactive to light. Conjunctivae and EOM are normal. Right eye exhibits no discharge. Left eye exhibits no discharge. No scleral icterus.  Neck: Normal range of motion.  Neck supple. No thyromegaly present.  No bruits or thyromegaly or anterior cervical adenopathy  Cardiovascular: Normal rate and normal heart sounds.  No murmur heard. Heart is irregular irregular at 84/min.  There is no edema.  Pulmonary/Chest: Effort normal and breath sounds normal. She has no wheezes. She has no rales.  Clear anteriorly and posteriorly with no wheezing or rales  Abdominal: Soft. Bowel sounds are normal. She exhibits no mass. There is tenderness. There is no rebound.  Abdominal obesity with epigastric tenderness without liver or spleen enlargement.  No bruits noted.  No inguinal adenopathy.  Musculoskeletal: She exhibits no edema or tenderness.  Patient is somewhat unsteady with her gait because of chronic back pain and uses a cane for ambulation.  She needed assistance to get on the exam table.  Lymphadenopathy:     She has no cervical adenopathy.  Neurological: She is alert and oriented to person, place, and time. She has normal reflexes. No cranial nerve deficit.  Skin: Skin is warm and dry. No rash noted.  Psychiatric: Her behavior is normal. Judgment and thought content normal.  Patient's mood and affect were depressed and tearful today.  Nursing note and vitals reviewed.  BP 108/68 (BP Location: Left Arm)   Pulse 60   Temp 98.2 F (36.8 C) (Oral)   Ht _0  (1.702 m)   Wt 199 lb (90.3 kg)   BMI 31.17 kg/m   EKG with results pending=== The patient's depression was discussed today.  She does not want to take any additional medicine.  We did discuss the addition of Wellbutrin CR 150 to the Prozac but she refuses to do this.  I did tell her to discuss this more when she sees Dr. Burt Knack.  We could just add this medication on to her current Prozac.      Assessment & Plan:  1. Essential hypertension -The blood pressure is good today.  She will continue current treatment. - CBC with Differential/Platelet - BMP8+EGFR - Hepatic function panel - EKG 12-Lead  2. Pure hypercholesterolemia -Continue current treatment pending results of lab work - CBC with Differential/Platelet - Lipid panel  3. Iron deficiency anemia, unspecified iron deficiency anemia type -This may have resolved as the visit from the hematologist mentions in his last note. - CBC with Differential/Platelet  4. Vitamin D deficiency -Continue with vitamin D replacement pending results of lab work - CBC with Differential/Platelet - VITAMIN D 25 Hydroxy (Vit-D Deficiency, Fractures)  5. Paroxysmal atrial fibrillation ([place]) -Patient remains in atrial fibrillation and will see the cardiologist in early December after getting an echocardiogram. - CBC with Differential/Platelet - EKG 12-Lead  6. Gastroesophageal reflux disease, esophagitis presence not specified -Patient continues to have trouble swallowing and she will  continue with her pantoprazole. - CBC with Differential/Platelet - Hepatic function panel  7. Chronic atrial fibrillation -Follow-up with cardiology as planned - diltiazem (CARDIZEM CD) 120 MG 24 hr capsule; Take 1 capsule (120 mg total) by mouth 2 (two) times daily.  Dispense: 180 capsule; Refill: 3  8. Chronic diastolic heart failure ([place]) -Follow-up with cardiology as planned - diltiazem (CARDIZEM CD) 120 MG 24 hr capsule; Take 1 capsule (120 mg total) by mouth 2 (two) times daily.  Dispense: 180 capsule; Refill: 3  9. Weakness -Check routine lab work and make sure the cardiologist gets copy of all blood work  10. Stable angina pectoris ([place]) -One episode of chest pain not related to activity.  Most of her complaints are related to  shortness of breath with activity but no chest pain.  11. Chronic back pain greater than 3 months duration -This is an ongoing problem.   12. SOB (shortness of breath) -This seems to be getting worse over the past few months.  The patient is very inactive and I feel that a lot of this is due to deconditioning.  13. Chronic diastolic CHF (congestive heart failure) ([place]) -Follow-up with cardiology as planned  Meds ordered this encounter  Medications  . diltiazem (CARDIZEM CD) 120 MG 24 hr capsule    Sig: Take 1 capsule (120 mg total) by mouth 2 (two) times daily.    Dispense:  180 capsule    Refill:  3   Patient Instructions                       Medicare Annual Wellness Visit  [facility] and the medical providers at [facility] strive to bring you the best medical care.  In doing so we not only want to address your current medical conditions and concerns but also to detect new conditions early and prevent illness, disease and health-related problems.    Medicare offers a yearly Wellness Visit which allows our clinical staff to assess your need for preventative services including immunizations, lifestyle education, counseling  to decrease risk of preventable diseases and screening for fall risk and other medical concerns.    This visit is provided free of charge (no copay) for all Medicare recipients. The clinical pharmacists at [facility] have begun to conduct these Wellness Visits which will also include a thorough review of all your medications.    As you primary medical provider recommend that you make an appointment for your Annual Wellness Visit if you have not done so already this year.  You may set up this appointment before you leave today or you may call back ([phone]) and schedule an appointment.  Please make sure when you call that you mention that you are scheduling your Annual Wellness Visit with the clinical pharmacist so that the appointment may be made for the proper length of time.     Continue current medications. Continue good therapeutic lifestyle changes which include good diet and exercise. Fall precautions discussed with patient. If an FOBT was given today- please return it to our front desk. If you are over 82 years old - you may need Prevnar 30 or the adult Pneumonia vaccine.  **Flu shots are available--- please call and schedule a FLU-CLINIC appointment**  After your visit with [place] today you will receive a survey in the mail or online from [employer] regarding your care with [place]. Please take a moment to fill this out. Your feedback is very important to [place] as you can help [place] better understand your patient needs as well as improve your experience and satisfaction. WE CARE ABOUT YOU!!!     Arrie Senate MD

## 2018-11-03 LAB — CBC WITH DIFFERENTIAL/PLATELET
BASOS ABS: 0 10*3/uL (ref 0.0–0.2)
BASOS: 1 %
EOS (ABSOLUTE): 0.1 10*3/uL (ref 0.0–0.4)
Eos: 2 %
Hematocrit: 37.4 % (ref 34.0–46.6)
Hemoglobin: 12 g/dL (ref 11.1–15.9)
IMMATURE GRANS (ABS): 0 10*3/uL (ref 0.0–0.1)
IMMATURE GRANULOCYTES: 0 %
LYMPHS: 30 %
Lymphocytes Absolute: 1.6 10*3/uL (ref 0.7–3.1)
MCH: 29.9 pg (ref 26.6–33.0)
MCHC: 32.1 g/dL (ref 31.5–35.7)
MCV: 93 fL (ref 79–97)
Monocytes Absolute: 0.5 10*3/uL (ref 0.1–0.9)
Monocytes: 9 %
NEUTROS PCT: 58 %
Neutrophils Absolute: 3.2 10*3/uL (ref 1.4–7.0)
PLATELETS: 203 10*3/uL (ref 150–450)
RBC: 4.02 x10E6/uL (ref 3.77–5.28)
RDW: 12.7 % (ref 12.3–15.4)
WBC: 5.4 10*3/uL (ref 3.4–10.8)

## 2018-11-03 LAB — BMP8+EGFR
BUN / CREAT RATIO: 12 (ref 12–28)
BUN: 9 mg/dL (ref 8–27)
CALCIUM: 8.9 mg/dL (ref 8.7–10.3)
CHLORIDE: 103 mmol/L (ref 96–106)
CO2: 28 mmol/L (ref 20–29)
Creatinine, Ser: 0.77 mg/dL (ref 0.57–1.00)
GFR calc Af Amer: 85 mL/min/{1.73_m2} (ref >59)
GFR, EST NON AFRICAN AMERICAN: 74 mL/min/{1.73_m2} (ref >59)
GLUCOSE: 99 mg/dL (ref 65–99)
POTASSIUM: 4.3 mmol/L (ref 3.5–5.2)
Sodium: 144 mmol/L (ref 134–144)

## 2018-11-03 LAB — LIPID PANEL
CHOL/HDL RATIO: 2.1 ratio (ref 0.0–4.4)
Cholesterol, Total: 122 mg/dL (ref 100–199)
HDL: 57 mg/dL (ref >39)
LDL Calculated: 47 mg/dL (ref 0–99)
Triglycerides: 89 mg/dL (ref 0–149)
VLDL CHOLESTEROL CAL: 18 mg/dL (ref 5–40)

## 2018-11-03 LAB — HEPATIC FUNCTION PANEL
ALT: 18 IU/L (ref 0–32)
AST: 23 IU/L (ref 0–40)
Albumin: 3.9 g/dL (ref 3.5–4.8)
Alkaline Phosphatase: 88 IU/L (ref 39–117)
BILIRUBIN TOTAL: 0.5 mg/dL (ref 0.0–1.2)
Bilirubin, Direct: 0.2 mg/dL (ref 0.00–0.40)
TOTAL PROTEIN: 6 g/dL (ref 6.0–8.5)

## 2018-11-03 LAB — VITAMIN D 25 HYDROXY (VIT D DEFICIENCY, FRACTURES): VIT D 25 HYDROXY: 39 ng/mL (ref 30.0–100.0)

## 2018-11-15 ENCOUNTER — Other Ambulatory Visit: Payer: Self-pay | Admitting: *Deleted

## 2018-11-15 DIAGNOSIS — I2510 Atherosclerotic heart disease of native coronary artery without angina pectoris: Secondary | ICD-10-CM

## 2018-11-15 DIAGNOSIS — I480 Paroxysmal atrial fibrillation: Secondary | ICD-10-CM

## 2018-11-15 MED ORDER — PANTOPRAZOLE SODIUM 40 MG PO TBEC: 40.0000 mg | DELAYED_RELEASE_TABLET | Freq: Two times a day (BID) | ORAL | 3 refills | Status: DC

## 2018-11-22 DIAGNOSIS — Z888 Allergy status to other drugs, medicaments and biological substances status: Secondary | ICD-10-CM | POA: Diagnosis not present

## 2018-11-22 DIAGNOSIS — Z885 Allergy status to narcotic agent status: Secondary | ICD-10-CM | POA: Diagnosis not present

## 2018-11-22 DIAGNOSIS — Z981 Arthrodesis status: Secondary | ICD-10-CM | POA: Diagnosis not present

## 2018-11-22 DIAGNOSIS — Z4789 Encounter for other orthopedic aftercare: Secondary | ICD-10-CM | POA: Diagnosis not present

## 2018-11-24 ENCOUNTER — Ambulatory Visit (HOSPITAL_COMMUNITY): Payer: Medicare HMO | Attending: Cardiovascular Disease

## 2018-11-24 ENCOUNTER — Other Ambulatory Visit: Payer: Self-pay

## 2018-11-24 DIAGNOSIS — I4821 Permanent atrial fibrillation: Secondary | ICD-10-CM

## 2018-11-24 DIAGNOSIS — I5032 Chronic diastolic (congestive) heart failure: Secondary | ICD-10-CM | POA: Diagnosis not present

## 2018-11-28 ENCOUNTER — Encounter: Payer: Self-pay | Admitting: Cardiovascular Disease

## 2018-11-28 ENCOUNTER — Ambulatory Visit: Payer: Medicare HMO | Admitting: Cardiovascular Disease

## 2018-11-28 VITALS — BP 116/62 | HR 72 | Ht 67.0 in | Wt 197.4 lb

## 2018-11-28 DIAGNOSIS — I25119 Atherosclerotic heart disease of native coronary artery with unspecified angina pectoris: Secondary | ICD-10-CM | POA: Diagnosis not present

## 2018-11-28 DIAGNOSIS — I5032 Chronic diastolic (congestive) heart failure: Secondary | ICD-10-CM

## 2018-11-28 DIAGNOSIS — I4821 Permanent atrial fibrillation: Secondary | ICD-10-CM | POA: Diagnosis not present

## 2018-11-28 MED ORDER — FUROSEMIDE 80 MG PO TABS: 80.0000 mg | ORAL_TABLET | Freq: Every day | ORAL | 3 refills | Status: DC

## 2018-11-28 NOTE — Progress Notes (Signed)
Cardiology Office Note:    Date:  11/28/2018   ID:  [name] [name], DOB Feb 25, 1941, MRN [id number]  PCP:  [name], MD  Cardiologist:  Sherren Mocha, MD  Electrophysiologist:  None   Referring MD: [name], MD   Chief Complaint  Patient presents with  . Shortness of Breath    History of Present Illness:    [name] [name] is a 77 y.o. female with a hx of atrial fibrillation, coronary artery disease with chronic angina, and chronic diastolic heart failure.  The patient is here with her daughter today.  She remains severely deconditioned with chronic weakness, low back problems, and shortness of breath with activity.  She continues to have intermittent chest pain with no recent change in pattern.  She has undergone cardiac catheterization to assess her recurrent chest pain and this is demonstrated stability of her coronary anatomy with continued patency of her left main stent.  She complains of persistent leg swelling.  She denies orthopnea or PND.  She is short of breath with minimal activity.  She has difficulty standing up from a chair on her own.  States that she just does not feel like doing anything.  Past Medical History:  Diagnosis Date  . Abnormal nuclear cardiac imaging test   . Anemia   . Anemia, iron deficiency 07/02/2015  . Anxiety   . Arthritis    "knees, back, fingers, toes; joints" (01/07/2015)  . 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 CAD (coronary artery disease)    a. Abnl nuc 11/2014. Cath 12/2014 - turned down for CABG. Ultimately s/p TTVP, rotational atherectomy, PTCA and stenting of the ostial LCx and left main into the LAD (crush technique), and IVUS of the LAD/Left main. // b. Myoview 11/17: EF 48, poor quality/significant artifact; inf-lateral, inferior ischemia; Intermediate Risk  . Chronic atrial fibrillation    a.  First noted post-op 9/15 spinal fusion. She had cardioversion, not on anticoagulation. Fall risk, unsteady. // failed DCCV // Holter  10/17: AFib, Avg HR 97, PVCs, no other arrhythmia  . Chronic back pain greater than 3 months duration    a. spinal stenosis. Spinal fusion with rods in 2/15 at [facility] spinal fusion 9/15.  Marland Kitchen Chronic diastolic CHF (congestive heart failure) ([place])   . Circadian rhythm sleep disorder   . CTS (carpal tunnel syndrome)   . Deficiency anemia 11/10/2014  . Depression   . Diarrhea   . Diverticulitis of colon   . Esophageal stricture   . Gastritis   . Gastroesophageal hernia 07/06/2013  . GERD (gastroesophageal reflux disease)   . Hiatal hernia   . History of blood transfusion    "most of them related to OR's"   . History of echocardiogram    a. Echo 2/17:  EF 50-55%, trivial AI, midl MR, mod LAE, PASP 37 mmHg  . HTN (hypertension)   . Hyperlipidemia   . IBS (irritable bowel syndrome)   . Incontinence 10/13/2012  . Insomnia   . Ischemic chest pain ([place])   . Memory disorder 12/04/2014  . Metabolic syndrome 01/25/9562  . MGUS (monoclonal gammopathy of unknown significance) dx'd 11/2014   a. Neg BMB 11/2014.  . Multiple falls   . Obesity   . Obstructive sleep apnea    "have mask; don't wear it" (01/07/2015)  . Orthostasis   . PAT (paroxysmal atrial tachycardia) ([place])   . Personal history of colonic polyps 10/25/2011 & 12/02/11   not retrieved Dr Lyla Son & tubular  adenomas  . Pneumonia 03/2014  . Sinus bradycardia    a. Baseline HR 50s-60s.  . Stroke [facility]) early 2000's   "small"; denies residual on 01/07/2015)    Past Surgical History:  Procedure Laterality Date  . BACK SURGERY    . BONE MARROW BIOPSY  11/2014  . CARDIAC CATHETERIZATION  12/27/2014   Procedure: INTRAVASCULAR PRESSURE WIRE/FFR STUDY;  Surgeon: Larey Dresser, MD;  Location: [facility] CATH LAB;  Service: Cardiovascular;;  . CARDIAC CATHETERIZATION N/A 11/25/2016   Procedure: Left Heart Cath and Coronary Angiography;  Surgeon: Sherren Mocha, MD;  Location: [place] CV LAB;  Service: Cardiovascular;  Laterality:  N/A;  . CARDIOVERSION N/A 02/13/2016   Procedure: CARDIOVERSION;  Surgeon: Josue Hector, MD;  Location: [facility] ENDOSCOPY;  Service: Cardiovascular;  Laterality: N/A;  . CARPAL TUNNEL RELEASE Right 1980's  . CATARACT EXTRACTION Bilateral   . CATARACT EXTRACTION W/ INTRAOCULAR LENS  IMPLANT, BILATERAL  2000's  . CORONARY ANGIOPLASTY WITH STENT PLACEMENT  01/07/2015   "2"  . CORONARY STENT PLACEMENT    . DILATION AND CURETTAGE OF UTERUS    . ESOPHAGOGASTRODUODENOSCOPY (EGD) WITH ESOPHAGEAL DILATION  "several times"  . JOINT REPLACEMENT    . KNEE ARTHROSCOPY Left 1995  . LAPAROSCOPIC CHOLECYSTECTOMY  2003  . LEFT HEART CATHETERIZATION WITH CORONARY ANGIOGRAM N/A 12/27/2014   Procedure: LEFT HEART CATHETERIZATION WITH CORONARY ANGIOGRAM;  Surgeon: Larey Dresser, MD;  Location: [facility] CATH LAB;  Service: Cardiovascular;  Laterality: N/A;  . PERCUTANEOUS CORONARY ROTOBLATOR INTERVENTION (PCI-R)  01/07/2015  . PERCUTANEOUS CORONARY ROTOBLATOR INTERVENTION (PCI-R) N/A 01/07/2015   Procedure: PERCUTANEOUS CORONARY ROTOBLATOR INTERVENTION (PCI-R);  Surgeon: Blane Ohara, MD;  Location: [facility] CATH LAB;  Service: Cardiovascular;  Laterality: N/A;  . POSTERIOR FUSION THORACIC SPINE  08/2015  . POSTERIOR LUMBAR FUSION  01/2015  . TOTAL KNEE ARTHROPLASTY Bilateral 1990's - 2000's    Current Medications: Current Meds  Medication Sig  . aspirin EC 81 MG tablet Take 1 tablet (81 mg total) by mouth daily.  . Calcium Citrate-Vitamin D (CALCIUM + D PO) Take 1 tablet by mouth daily.  . Cholecalciferol (VITAMIN D) 2000 units tablet Take 2,000 Units by mouth daily.  . clobetasol cream (TEMOVATE) 9.16 % Apply 1 application topically See admin instructions. Applies to vaginal area twice a week.  . diltiazem (CARDIZEM CD) 120 MG 24 hr capsule Take 1 capsule (120 mg total) by mouth 2 (two) times daily.  . diphenoxylate-atropine (LOMOTIL) 2.5-0.025 MG per tablet Take 1 tablet by mouth 4 (four) times daily as needed for  diarrhea or loose stools.   Marland Kitchen ELIQUIS 5 MG TABS tablet Take 1 tablet (5 mg total) by mouth 2 (two) times daily.  . Eszopiclone 3 MG TABS Take 1 tablet (3 mg total) by mouth at bedtime. For insomnia,take immediately before bedtime.  . fentaNYL (DURAGESIC - DOSED MCG/HR) 50 MCG/HR Place 1 patch (50 mcg total) onto the skin every 3 (three) days.  Marland Kitchen FLUoxetine (PROZAC) 40 MG capsule Take 1 capsule (40 mg total) by mouth daily.  . furosemide (LASIX) 80 MG tablet Take 1 tablet (80 mg total) by mouth daily.  Marland Kitchen HYDROcodone-acetaminophen (NORCO) 10-325 MG per tablet Take 1 tablet by mouth every 4 (four) hours as needed for moderate pain.   Marland Kitchen LORazepam (ATIVAN) 0.5 MG tablet Take 1 tablet (0.5 mg total) by mouth 2 (two) times daily as needed for anxiety.  . meclizine (ANTIVERT) 25 MG tablet Take 1 tablet (25 mg total) by mouth  3 (three) times daily as needed for dizziness.  . methocarbamol (ROBAXIN) 500 MG tablet Take 500 mg by mouth 4 (four) times daily as needed for muscle spasms.  . Multiple Vitamin (MULTIVITAMIN) tablet Take 1 tablet by mouth daily. For supplement  . nitroGLYCERIN (NITROSTAT) 0.4 MG SL tablet Place 1 tablet (0.4 mg total) under the tongue every 5 (five) minutes as needed for chest pain (up to 3 doses).  . pantoprazole (PROTONIX) 40 MG tablet Take 1 tablet (40 mg total) by mouth 2 (two) times daily.  . potassium chloride SA (K-DUR,KLOR-CON) 20 MEQ tablet Take 1 tablet (20 mEq total) by mouth daily.  . rosuvastatin (CRESTOR) 10 MG tablet Take 1 tablet (10 mg total) by mouth as directed.  . [DISCONTINUED] furosemide (LASIX) 40 MG tablet TAKE 1.5 TABLETS (60 MG TOTAL) BY MOUTH DAILY     Allergies:   Buprenorphine hcl; Cymbalta [duloxetine hcl]; Morphine and related; Oxycodone-acetaminophen; Valium [diazepam]; Statins; Altace [ramipril]; Floxin [ofloxacin]; Hydromorphone; Lovaza [omega-3-acid ethyl esters]; Trilipix [choline fenofibrate]; and Zetia [ezetimibe]   Social History    Socioeconomic History  . Marital status: Widowed    Spouse name: Not on file  . Number of children: 2  . Years of education: HS  . Highest education level: Not on file  Occupational History  . Occupation: Licensed conveyancer- retired    Comment: retired  Scientific laboratory technician  . Financial resource strain: Not on file  . Food insecurity:    Worry: Not on file    Inability: Not on file  . Transportation needs:    Medical: Not on file    Non-medical: Not on file  Tobacco Use  . Smoking status: Never Smoker  . Smokeless tobacco: Never Used  Substance and Sexual Activity  . Alcohol use: No    Comment: 01/07/2015 "glass of wine at Christmas, maybe"  . Drug use: No  . Sexual activity: Never  Lifestyle  . Physical activity:    Days per week: Not on file    Minutes per session: Not on file  . Stress: Not on file  Relationships  . Social connections:    Talks on phone: Not on file    Gets together: Not on file    Attends religious service: Not on file    Active member of club or organization: Not on file    Attends meetings of clubs or organizations: Not on file    Relationship status: Not on file  Other Topics Concern  . Not on file  Social History Narrative   Patient is right handed   Patient drinks 1-2 sodas daily.   patlient lives alone.     Family History: The patient's family history includes Colon cancer in her sister and sister; Coronary artery disease in her brother, father, and mother; Emphysema in her brother and sister; Peripheral vascular disease in her father; Sleep apnea in her son. There is no history of Dementia.  ROS:   Please see the history of present illness.    Positive for leg swelling, hearing loss, shortness of breath with activity, abdominal pain, diarrhea, back pain, muscle pain, dizziness, easy bruising, excessive sweating, excessive fatigue, chest pressure, irregular heartbeats, constipation, anxiety, joint swelling, balance problems.  All other systems reviewed  and are negative.  EKGs/Labs/Other Studies Reviewed:    The following studies were reviewed today: 2D Echo: Study Conclusions  - Left ventricle: The cavity size was normal. Systolic function was   normal. The estimated ejection fraction was in the range of  50%   to 55%. Wall motion was normal; there were no regional wall   motion abnormalities. - Aortic valve: There was trivial regurgitation. - Mitral valve: There was trivial regurgitation. - Left atrium: The atrium was severely dilated. - Right atrium: The atrium was moderately dilated. - Atrial septum: No defect or patent foramen ovale was identified. - Tricuspid valve: There was mild regurgitation. - Pulmonary arteries: Systolic pressure was mildly increased. PA   peak pressure: 35 mm Hg (S).  Impressions:  - Normal LV EF, afib throughout study.  Cardiac Cath 11-25-2016: Conclusion   Continued patency of the distal left mainstem/proximal LAD/proximal circumflex bifurcation stents with mild ISR of the proximal circumflex  Mild calcific, nonobstructive CAD  Recommend: continued medical therapy for CAD, CHF, and atrial fibrillation  Indications   Abnormal nuclear cardiac imaging test [R93.1 (ICD-10-CM)]  Procedural Details   Technical Details INDICATION: Abnormal nuclear scan with inferolateral ischemia. Previous bifurcation stenting of the distal left main/LAD/LCx.  PROCEDURAL DETAILS: The right wrist was prepped, draped, and anesthetized with 1% lidocaine. Using the modified Seldinger technique, a 5/6 French Slender sheath was introduced into the right radial artery. 3 mg of verapamil was administered through the sheath, weight-based unfractionated heparin was administered intravenously. Standard Judkins catheters were used for selective coronary angiography and left ventricular pressure recording, Ao pullback done. Catheter exchanges were performed over an exchange length guidewire. There were no immediate procedural  complications. A TR band was used for radial hemostasis at the completion of the procedure. The patient was transferred to the post catheterization recovery area for further monitoring.   Estimated blood loss <50 mL.  During this procedure the patient was administered the following to achieve and maintain moderate conscious sedation: Versed 3 mg, Fentanyl 25 mcg, while the patient's heart rate, blood pressure, and oxygen saturation were continuously monitored. The period of conscious sedation was 34 minutes, of which I was present face-to-face 100% of this time.  Medications  (Filter: Administrations occurring from 11/25/16 1055 to 11/25/16 1150)  Medication Rate/Dose/Volume Action  Date Time   fentaNYL (SUBLIMAZE) injection (mcg) 25 mcg Given 11/25/16 1109   Total dose as of 11/28/18 1437        25 mcg        midazolam (VERSED) injection (mg) 1 mg Given 11/25/16 1109   Total dose as of 11/28/18 1437 2 mg Given 1125   3 mg        heparin injection (Units) 5,000 Units Given 11/25/16 1140   Total dose as of 11/28/18 1437        5,000 Units        heparin infusion 2 units/mL in 0.9 % sodium chloride (mL) 1,000 mL New Bag/Given 11/25/16 1140   Total dose as of 11/28/18 1437        Cannot be calculated        lidocaine (PF) (XYLOCAINE) 1 % injection (mL) 2 mL Given 11/25/16 1140   Total dose as of 11/28/18 1437        2 mL        iopamidol (ISOVUE-370) 76 % injection (mL) 75 mL Given 11/25/16 1147   Total dose as of 11/28/18 1437        75 mL        Sedation Time   Sedation Time Physician-1: 33 minutes 51 seconds  Coronary Findings   Diagnostic  Dominance: Right  Left Main  Ost LM to LM lesion 0% stenosed  Previously placed Ost LM  to LM drug eluting stent is widely patent. There is a stent extending from the midportion of the left mainstem into the proximal LAD. There is no significant stenosis throughout the stented segment.  Left Anterior Descending  The vessel exhibits minimal  luminal irregularities.  Left Circumflex  The vessel exhibits minimal luminal irregularities.  Ost Cx to Prox Cx lesion 25% stenosed  The lesion was previously treated using a drug eluting stent between 1-2 years ago. The left circumflex stent was previously 'crushed' as part of the distal left main intervention. The stent is widely patent with mild 25% in-stent restenosis at the origin of the circumflex.  Right Coronary Artery  The vessel exhibits minimal luminal irregularities.  Mid RCA lesion 40% stenosed  The lesion is moderately calcified. nonobstructive calcified mid-RCA stenosis  Intervention   No interventions have been documented.  Coronary Diagrams   Diagnostic  Dominance: Right     EKG:  EKG is not ordered today.   Recent Labs: 06/21/2018: TSH 3.170 11/02/2018: ALT 18; BUN 9; Creatinine, Ser 0.77; Hemoglobin 12.0; Platelets 203; Potassium 4.3; Sodium 144  Recent Lipid Panel    Component Value Date/Time   CHOL 122 11/02/2018 1510   CHOL 156 05/11/2013 1536   TRIG 89 11/02/2018 1510   TRIG 121 02/18/2016 1504   TRIG 239 (H) 05/11/2013 1536   HDL 57 11/02/2018 1510   HDL 57 02/18/2016 1504   HDL 43 05/11/2013 1536   CHOLHDL 2.1 11/02/2018 1510   LDLCALC 47 11/02/2018 1510   LDLCALC 50 08/21/2014 1141   LDLCALC 65 05/11/2013 1536    Physical Exam:    VS:  BP 116/62   Pulse 72   Ht 5' 7"  (1.702 m)   Wt 197 lb 6.4 oz (89.5 kg)   SpO2 96%   BMI 30.92 kg/m     Wt Readings from Last 3 Encounters:  11/28/18 197 lb 6.4 oz (89.5 kg)  11/02/18 199 lb (90.3 kg)  07/25/18 196 lb 8 oz (89.1 kg)     GEN: elderly woman in no acute distress HEENT: Normal NECK: No JVD; No carotid bruits LYMPHATICS: No lymphadenopathy CARDIAC: irregularly irregular, no murmurs, rubs, gallops RESPIRATORY:  Clear to auscultation without rales, wheezing or rhonchi  ABDOMEN: Soft, non-tender, non-distended MUSCULOSKELETAL:  1+ bilateral pretibial edema; No deformity  SKIN: Warm and  dry NEUROLOGIC:  Alert and oriented x 3 PSYCHIATRIC:  Normal affect   ASSESSMENT:    1. Permanent atrial fibrillation   2. Chronic diastolic (congestive) heart failure ([place])   3. Coronary artery disease involving native coronary artery of native heart with angina pectoris ([place])    PLAN:    In order of problems listed above:  1. The patient is anticoagulated with apixaban.  Her echocardiogram is reviewed which demonstrates marked biatrial enlargement and normal LV systolic function.  We will continue with diltiazem for heart rate control.  She does not tolerate beta blockade. 2. The patient's volume is up.  She has significant leg edema.  I suspect this is multifactorial, but I think we should increase her furosemide to 80 mg daily.  She will continue with her same potassium supplement dose.  I will plan to see her back in 6 months. 3. The patient remains on antiplatelet therapy with aspirin 81 mg.  There is no change in her symptoms.  Her most recent heart catheterization is reviewed.  Overall I think Ms. Gagan continues to decline.  She has marked deconditioning and is barely able to  get out of a chair on her own.  She is limited by chronic back and neck problems.  Home health PT has been recommended and I strongly encouraged her to participate and follow through with regular exercises.  Medication Adjustments/Labs and Tests Ordered: Current medicines are reviewed at length with the patient today.  Concerns regarding medicines are outlined above.  No orders of the defined types were placed in this encounter.  Meds ordered this encounter  Medications  . furosemide (LASIX) 80 MG tablet    Sig: Take 1 tablet (80 mg total) by mouth daily.    Dispense:  90 tablet    Refill:  3    Patient Instructions  Medication Instructions:  1) INCREASE LASIX to 80 mg daily  Labwork: None  Testing/Procedures: None  Follow-Up: Your provider wants you to follow-up in: 6 months with Dr. Burt Knack.  You will receive a reminder letter in the mail two months in advance. If you don't receive a letter, please call our office to schedule the follow-up appointment.       Signed, Sherren Mocha, MD  11/28/2018 2:58 PM    [place]

## 2018-11-28 NOTE — Patient Instructions (Signed)
Medication Instructions:  1) INCREASE LASIX to 80 mg daily  Labwork: None  Testing/Procedures: None  Follow-Up: Your provider wants you to follow-up in: 6 months with Dr. Burt Knack. You will receive a reminder letter in the mail two months in advance. If you don't receive a letter, please call our office to schedule the follow-up appointment.

## 2018-12-12 DIAGNOSIS — L728 Other follicular cysts of the skin and subcutaneous tissue: Secondary | ICD-10-CM | POA: Diagnosis not present

## 2018-12-12 DIAGNOSIS — B351 Tinea unguium: Secondary | ICD-10-CM | POA: Diagnosis not present

## 2018-12-12 DIAGNOSIS — L309 Dermatitis, unspecified: Secondary | ICD-10-CM | POA: Diagnosis not present

## 2018-12-26 DIAGNOSIS — Z1231 Encounter for screening mammogram for malignant neoplasm of breast: Secondary | ICD-10-CM | POA: Diagnosis not present

## 2018-12-26 LAB — HM MAMMOGRAPHY

## 2019-01-02 DIAGNOSIS — G8929 Other chronic pain: Secondary | ICD-10-CM | POA: Diagnosis not present

## 2019-01-02 DIAGNOSIS — R109 Unspecified abdominal pain: Secondary | ICD-10-CM | POA: Diagnosis not present

## 2019-01-02 DIAGNOSIS — R1310 Dysphagia, unspecified: Secondary | ICD-10-CM | POA: Diagnosis not present

## 2019-01-02 DIAGNOSIS — K582 Mixed irritable bowel syndrome: Secondary | ICD-10-CM | POA: Diagnosis not present

## 2019-01-03 ENCOUNTER — Ambulatory Visit: Payer: Medicare HMO | Admitting: Podiatry

## 2019-01-03 DIAGNOSIS — B351 Tinea unguium: Secondary | ICD-10-CM | POA: Diagnosis not present

## 2019-01-03 DIAGNOSIS — M79674 Pain in right toe(s): Secondary | ICD-10-CM | POA: Diagnosis not present

## 2019-01-03 DIAGNOSIS — M79675 Pain in left toe(s): Secondary | ICD-10-CM | POA: Diagnosis not present

## 2019-01-03 DIAGNOSIS — G609 Hereditary and idiopathic neuropathy, unspecified: Secondary | ICD-10-CM | POA: Diagnosis not present

## 2019-01-03 DIAGNOSIS — B353 Tinea pedis: Secondary | ICD-10-CM | POA: Diagnosis not present

## 2019-01-03 DIAGNOSIS — Q828 Other specified congenital malformations of skin: Secondary | ICD-10-CM | POA: Diagnosis not present

## 2019-01-03 DIAGNOSIS — N904 Leukoplakia of vulva: Secondary | ICD-10-CM | POA: Insufficient documentation

## 2019-01-03 MED ORDER — KETOCONAZOLE 2 % EX CREA: 1.0000 "application " | TOPICAL_CREAM | Freq: Every day | CUTANEOUS | 1 refills | Status: DC

## 2019-01-03 NOTE — Patient Instructions (Addendum)
Corns and Calluses Corns are small areas of thickened skin that occur on the top, sides, or tip of a toe. They contain a cone-shaped core with a point that can press on a nerve below. This causes pain.  Calluses are areas of thickened skin that can occur anywhere on the body, including the hands, fingers, palms, soles of the feet, and heels. Calluses are usually larger than corns. What are the causes? Corns and calluses are caused by rubbing (friction) or pressure, such as from shoes that are too tight or do not fit properly. What increases the risk? Corns are more likely to develop in people who have misshapen toes (toe deformities), such as hammer toes. Calluses can occur with friction to any area of the skin. They are more likely to develop in people who:  Work with their hands.  Wear shoes that fit poorly, are too tight, or are high-heeled.  Have toe deformities. What are the signs or symptoms? Symptoms of a corn or callus include:  A hard growth on the skin.  Pain or tenderness under the skin.  Redness and swelling.  Increased discomfort while wearing tight-fitting shoes, if your feet are affected. If a corn or callus becomes infected, symptoms may include:  Redness and swelling that gets worse.  Pain.  Fluid, blood, or pus draining from the corn or callus. How is this diagnosed? Corns and calluses may be diagnosed based on your symptoms, your medical history, and a physical exam. How is this treated? Treatment for corns and calluses may include:  Removing the cause of the friction or pressure. This may involve: ? Changing your shoes. ? Wearing shoe inserts (orthotics) or other protective layers in your shoes, such as a corn pad. ? Wearing gloves.  Applying medicine to the skin (topical medicine) to help soften skin in the hardened, thickened areas.  Removing layers of dead skin with a file to reduce the size of the corn or callus.  Removing the corn or callus with a  scalpel or laser.  Taking antibiotic medicines, if your corn or callus is infected.  Having surgery, if a toe deformity is the cause. Follow these instructions at home:   Take over-the-counter and prescription medicines only as told by your health care provider.  If you were prescribed an antibiotic, take it as told by your health care provider. Do not stop taking it even if your condition starts to improve.  Wear shoes that fit well. Avoid wearing high-heeled shoes and shoes that are too tight or too loose.  Wear any padding, protective layers, gloves, or orthotics as told by your health care provider.  Soak your hands or feet and then use a file or pumice stone to soften your corn or callus. Do this as told by your health care provider.  Check your corn or callus every day for symptoms of infection. Contact a health care provider if you:  Notice that your symptoms do not improve with treatment.  Have redness or swelling that gets worse.  Notice that your corn or callus becomes painful.  Have fluid, blood, or pus coming from your corn or callus.  Have new symptoms. Summary  Corns are small areas of thickened skin that occur on the top, sides, or tip of a toe.  Calluses are areas of thickened skin that can occur anywhere on the body, including the hands, fingers, palms, and soles of the feet. Calluses are usually larger than corns.  Corns and calluses are caused by   rubbing (friction) or pressure, such as from shoes that are too tight or do not fit properly.  Treatment may include wearing any padding, protective layers, gloves, or orthotics as told by your health care provider. This information is not intended to replace advice given to you by your health care provider. Make sure you discuss any questions you have with your health care provider. Document Released: 09/12/2004 Document Revised: 10/20/2017 Document Reviewed: 10/20/2017 Elsevier Interactive Patient Education   2019 [place]  Athlete's foot (tinea pedis) is a fungal infection of the skin on your feet. It often occurs on the skin that is between or underneath your toes. It can also occur on the soles of your feet. Symptoms include itchy or white and flaky areas on the skin. The infection can spread from person to person (is contagious). It can also spread when a person's bare feet come in contact with the fungus on shower floors or on items such as shoes. Follow these instructions at home: Medicines  Apply or take over-the-counter and prescription medicines only as told by your doctor.  Apply your antifungal medicine as told by your doctor. Do not stop using the medicine even if your feet start to get better. Foot care  Do not scratch your feet.  Keep your feet dry: ? Wear cotton or wool socks. Change your socks every day or if they become wet. ? Wear shoes that allow air to move around, such as sandals or canvas tennis shoes.  Wash and dry your feet: ? Every day or as told by your doctor. ? After exercising. ? Including the area between your toes. General instructions  Do not share any of these items that touch your feet: ? Towels. ? Shoes. ? Nail clippers. ? Other personal items.  Protect your feet by wearing sandals in wet areas, such as locker rooms and shared showers.  Keep all follow-up visits as told by your doctor. This is important.  If you have diabetes, keep your blood sugar under control. Contact a doctor if:  You have a fever.  You have swelling, pain, warmth, or redness in your foot.  Your feet are not getting better with treatment.  Your symptoms get worse.  You have new symptoms. Summary  Athlete's foot is a fungal infection of the skin on your feet.  Symptoms include itchy or white and flaky areas on the skin.  Apply your antifungal medicine as told by your doctor.  Keep your feet clean and dry. This information is not intended to  replace advice given to you by your health care provider. Make sure you discuss any questions you have with your health care provider. Document Released: 05/25/2008 Document Revised: 09/27/2017 Document Reviewed: 09/27/2017 Elsevier Interactive Patient Education  2019 [employer] Onychomycosis/Fungal Toenails  WHAT IS IT? An infection that lies within the keratin of your nail plate that is caused by a fungus.  WHY ME? Fungal infections affect all ages, sexes, races, and creeds.  There may be many factors that predispose you to a fungal infection such as age, coexisting medical conditions such as diabetes, or an autoimmune disease; stress, medications, fatigue, genetics, etc.  Bottom line: fungus thrives in a warm, moist environment and your shoes offer such a location.  IS IT CONTAGIOUS? Theoretically, yes.  You do not want to share shoes, nail clippers or files with someone who has fungal toenails.  Walking around barefoot in the same room or sleeping in the same bed is  unlikely to transfer the organism.  It is important to realize, however, that fungus can spread easily from one nail to the next on the same foot.  HOW DO WE TREAT THIS?  There are several ways to treat this condition.  Treatment may depend on many factors such as age, medications, pregnancy, liver and kidney conditions, etc.  It is best to ask your doctor which options are available to you.  1. No treatment.   Unlike many other medical concerns, you can live with this condition.  However for many people this can be a painful condition and may lead to ingrown toenails or a bacterial infection.  It is recommended that you keep the nails cut short to help reduce the amount of fungal nail. 2. Topical treatment.  These range from herbal remedies to prescription strength nail lacquers.  About 40-50% effective, topicals require twice daily application for approximately 9 to 12 months or until an entirely new nail has grown out.  The most  effective topicals are medical grade medications available through physicians offices. 3. Oral antifungal medications.  With an 80-90% cure rate, the most common oral medication requires 3 to 4 months of therapy and stays in your system for a year as the new nail grows out.  Oral antifungal medications do require blood work to make sure it is a safe drug for you.  A liver function panel will be performed prior to starting the medication and after the first month of treatment.  It is important to have the blood work performed to avoid any harmful side effects.  In general, this medication safe but blood work is required. 4. Laser Therapy.  This treatment is performed by applying a specialized laser to the affected nail plate.  This therapy is noninvasive, fast, and non-painful.  It is not covered by insurance and is therefore, out of pocket.  The results have been very good with a 80-95% cure rate.  [facility] is the only practice in the area to offer this therapy. 5. Permanent Nail Avulsion.  Removing the entire nail so that a new nail will not grow back.

## 2019-01-11 DIAGNOSIS — L900 Lichen sclerosus et atrophicus: Secondary | ICD-10-CM | POA: Diagnosis not present

## 2019-01-22 ENCOUNTER — Encounter: Payer: Self-pay | Admitting: Podiatry

## 2019-01-22 NOTE — Progress Notes (Signed)
Subjective: [name] [name] presents today with painful, thick toenails 1-5 b/l that she cannot cut and which interfere with daily activities.  Pain is aggravated when wearing enclosed shoe gear.  Patient also notes painful callus right foot which is pain periodically.  [name], MD is her PCP.   Allergies  Allergen Reactions  . Buprenorphine Hcl Itching  . Cymbalta [Duloxetine Hcl] Other (See Comments)    Other reaction(s): Other (See Comments) Personality changes - crying  2014  . Morphine And Related Itching  . Oxycodone-Acetaminophen Other (See Comments)    Personality changes   . Valium [Diazepam] Other (See Comments)    Personality changes - "in another world" ;  Hallucinations  2015  . Statins Other (See Comments)    Other reaction(s): Other (See Comments) Muscle weakness Muscle weakness  . Altace [Ramipril] Other (See Comments)    unknown  . Floxin [Ofloxacin] Other (See Comments)    unknown  . Hydromorphone Other (See Comments)    Other reaction(s): Confusion (intolerance) unknown  . Lovaza [Omega-3-Acid Ethyl Esters] Other (See Comments)    Muscle weakness   . Trilipix [Choline Fenofibrate] Other (See Comments)    Muscle weakness   . Zetia [Ezetimibe] Other (See Comments)    Muscle weakness     Objective:  Vascular Examination: Capillary refill time immediate x 10 digits  Dorsalis pedis 2/4  Posterior tibial pulses 1/4 b/l  Digital hair present x 10 digits  Skin temperature gradient WNL b/l  Dermatological Examination: Skin with normal turgor, texture and tone b/l  No open wounds noted bilaterally  Toenails 1-5 b/l discolored, thick, dystrophic with subungual debris and pain with palpation to nailbeds due to thickness of nails.  Hyperkeratotic lesion noted submetatarsal head 5 right foot.  No erythema, no edema, no drainage, no flocculence noted.  Diffuse scaling noted peripherally and plantarly b/l feet with mild foot odor.  No  interdigital macerations.  No blisters, no weeping. No signs of secondary bacterial infection noted.  Musculoskeletal: Muscle strength 5/5 to all LE muscle groups  Hammertoe second digit right foot  No pain, crepitus or joint limitation noted with ROM.   Patient ambulates with a walker   Neurological: Sensation intact with 10 gram monofilament. Vibratory sensation diminished bilaterally  Assessment: Painful onychomycosis toenails 1-5 b/l  Callus submetatarsal head 5 right foot Patient on long-term blood thinner Tinea pedis bilaterally Neuropathy  Plan: 1. Toenails 1-5 b/l were debrided in length and girth without iatrogenic bleeding. 2. Callus pared submetatarsal 3 right foot without incident. For tinea pedis, prescription sent to pharmacy for Ketoconazole Cream 2% to be applied to both feet and between toes qd x 6 weeks. 3. Patient to continue soft, supportive shoe gear 4. Patient to report any pedal injuries to medical professional immediately. 5. Follow up 3 months. Patient/POA to call should there be a concern in the interim.

## 2019-01-23 ENCOUNTER — Other Ambulatory Visit: Payer: Medicare HMO

## 2019-01-25 ENCOUNTER — Inpatient Hospital Stay: Payer: Medicare HMO | Attending: Hematology

## 2019-01-25 DIAGNOSIS — I110 Hypertensive heart disease with heart failure: Secondary | ICD-10-CM | POA: Insufficient documentation

## 2019-01-25 DIAGNOSIS — D472 Monoclonal gammopathy: Secondary | ICD-10-CM

## 2019-01-25 DIAGNOSIS — F329 Major depressive disorder, single episode, unspecified: Secondary | ICD-10-CM | POA: Diagnosis not present

## 2019-01-25 DIAGNOSIS — R69 Illness, unspecified: Secondary | ICD-10-CM | POA: Diagnosis not present

## 2019-01-25 DIAGNOSIS — I5032 Chronic diastolic (congestive) heart failure: Secondary | ICD-10-CM | POA: Insufficient documentation

## 2019-01-25 DIAGNOSIS — F0390 Unspecified dementia without behavioral disturbance: Secondary | ICD-10-CM | POA: Diagnosis not present

## 2019-01-25 DIAGNOSIS — E785 Hyperlipidemia, unspecified: Secondary | ICD-10-CM | POA: Diagnosis not present

## 2019-01-25 DIAGNOSIS — D509 Iron deficiency anemia, unspecified: Secondary | ICD-10-CM

## 2019-01-25 DIAGNOSIS — Z862 Personal history of diseases of the blood and blood-forming organs and certain disorders involving the immune mechanism: Secondary | ICD-10-CM | POA: Diagnosis not present

## 2019-01-25 DIAGNOSIS — R531 Weakness: Secondary | ICD-10-CM | POA: Insufficient documentation

## 2019-01-25 LAB — CBC WITH DIFFERENTIAL (CANCER CENTER ONLY)
Abs Immature Granulocytes: 0.02 10*3/uL (ref 0.00–0.07)
Basophils Absolute: 0 10*3/uL (ref 0.0–0.1)
Basophils Relative: 1 %
EOS ABS: 0.1 10*3/uL (ref 0.0–0.5)
Eosinophils Relative: 1 %
HCT: 38.5 % (ref 36.0–46.0)
Hemoglobin: 12.3 g/dL (ref 12.0–15.0)
Immature Granulocytes: 0 %
Lymphocytes Relative: 26 %
Lymphs Abs: 1.4 10*3/uL (ref 0.7–4.0)
MCH: 30.4 pg (ref 26.0–34.0)
MCHC: 31.9 g/dL (ref 30.0–36.0)
MCV: 95.1 fL (ref 80.0–100.0)
Monocytes Absolute: 0.4 10*3/uL (ref 0.1–1.0)
Monocytes Relative: 7 %
NRBC: 0 % (ref 0.0–0.2)
Neutro Abs: 3.5 10*3/uL (ref 1.7–7.7)
Neutrophils Relative %: 65 %
PLATELETS: 207 10*3/uL (ref 150–400)
RBC: 4.05 MIL/uL (ref 3.87–5.11)
RDW: 13.2 % (ref 11.5–15.5)
WBC Count: 5.4 10*3/uL (ref 4.0–10.5)

## 2019-01-25 LAB — CMP (CANCER CENTER ONLY)
ALT: 35 U/L (ref 0–44)
AST: 28 U/L (ref 15–41)
Albumin: 3.5 g/dL (ref 3.5–5.0)
Alkaline Phosphatase: 122 U/L (ref 38–126)
Anion gap: 8 (ref 5–15)
BUN: 11 mg/dL (ref 8–23)
CHLORIDE: 102 mmol/L (ref 98–111)
CO2: 29 mmol/L (ref 22–32)
Calcium: 9.5 mg/dL (ref 8.9–10.3)
Creatinine: 0.85 mg/dL (ref 0.44–1.00)
GFR, Est AFR Am: >60 mL/min (ref >60)
Glucose, Bld: 119 mg/dL — ABNORMAL HIGH (ref 70–99)
Potassium: 4 mmol/L (ref 3.5–5.1)
SODIUM: 139 mmol/L (ref 135–145)
Total Bilirubin: 0.5 mg/dL (ref 0.3–1.2)
Total Protein: 6.6 g/dL (ref 6.5–8.1)

## 2019-01-25 LAB — FERRITIN: FERRITIN: 100 ng/mL (ref 11–307)

## 2019-01-26 LAB — KAPPA/LAMBDA LIGHT CHAINS
Kappa free light chain: 32.2 mg/L — ABNORMAL HIGH (ref 3.3–19.4)
Kappa, lambda light chain ratio: 1.06 (ref 0.26–1.65)
Lambda free light chains: 30.5 mg/L — ABNORMAL HIGH (ref 5.7–26.3)

## 2019-01-27 LAB — PROTEIN ELECTROPHORESIS, SERUM, WITH REFLEX
A/G Ratio: 1.1 (ref 0.7–1.7)
ALBUMIN ELP: 3.2 g/dL (ref 2.9–4.4)
Alpha-1-Globulin: 0.3 g/dL (ref 0.0–0.4)
Alpha-2-Globulin: 0.9 g/dL (ref 0.4–1.0)
Beta Globulin: 0.8 g/dL (ref 0.7–1.3)
GAMMA GLOBULIN: 0.9 g/dL (ref 0.4–1.8)
Globulin, Total: 2.8 g/dL (ref 2.2–3.9)
Total Protein ELP: 6 g/dL (ref 6.0–8.5)

## 2019-01-30 ENCOUNTER — Telehealth: Payer: Self-pay

## 2019-01-30 NOTE — Telephone Encounter (Signed)
Spoke with patient regarding lab results, per Dr. Burr Medico, CBC, CMP, SPEP and iron study are all WNL, no concerns, patient verbalized an understanding and was appreciative of the call.

## 2019-01-30 NOTE — Telephone Encounter (Signed)
-----   Message from Truitt Merle, MD sent at 01/28/2019  4:18 PM EST ----- Please let pt or her daughter know the lab results, CBC, CMP, SPEP and iron study are all WNL, no concerns, thanks   Truitt Merle  01/28/2019

## 2019-02-04 ENCOUNTER — Other Ambulatory Visit: Payer: Self-pay | Admitting: Cardiovascular Disease

## 2019-02-06 NOTE — Telephone Encounter (Signed)
Pt last saw Dr Burt Knack 11/28/18, last labs 01/25/19 Creat 0.85, age 78, weight 89.5kg, based on specified criteria pt is on appropriate dosage of Eliquis 5mg  BID.  Will refill rx.

## 2019-02-07 DIAGNOSIS — Z79899 Other long term (current) drug therapy: Secondary | ICD-10-CM | POA: Diagnosis not present

## 2019-02-07 DIAGNOSIS — G894 Chronic pain syndrome: Secondary | ICD-10-CM | POA: Diagnosis not present

## 2019-02-15 ENCOUNTER — Other Ambulatory Visit: Payer: Self-pay | Admitting: *Deleted

## 2019-02-15 MED ORDER — ESZOPICLONE 3 MG PO TABS: 3.0000 mg | ORAL_TABLET | Freq: Every day | ORAL | 1 refills | Status: DC

## 2019-03-07 ENCOUNTER — Other Ambulatory Visit: Payer: Self-pay

## 2019-03-08 ENCOUNTER — Ambulatory Visit: Payer: Medicare HMO | Admitting: Family Medicine

## 2019-03-08 ENCOUNTER — Ambulatory Visit: Payer: Medicare HMO

## 2019-03-08 ENCOUNTER — Encounter: Payer: Self-pay | Admitting: Family Medicine

## 2019-03-08 VITALS — BP 100/66 | HR 79 | Temp 97.4°F | Ht 67.0 in | Wt 190.0 lb

## 2019-03-08 DIAGNOSIS — K219 Gastro-esophageal reflux disease without esophagitis: Secondary | ICD-10-CM | POA: Diagnosis not present

## 2019-03-08 DIAGNOSIS — R102 Pelvic and perineal pain: Secondary | ICD-10-CM

## 2019-03-08 DIAGNOSIS — E559 Vitamin D deficiency, unspecified: Secondary | ICD-10-CM

## 2019-03-08 DIAGNOSIS — I480 Paroxysmal atrial fibrillation: Secondary | ICD-10-CM

## 2019-03-08 DIAGNOSIS — Z780 Asymptomatic menopausal state: Secondary | ICD-10-CM

## 2019-03-08 DIAGNOSIS — Z13820 Encounter for screening for osteoporosis: Secondary | ICD-10-CM

## 2019-03-08 DIAGNOSIS — D509 Iron deficiency anemia, unspecified: Secondary | ICD-10-CM

## 2019-03-08 DIAGNOSIS — E7800 Pure hypercholesterolemia, unspecified: Secondary | ICD-10-CM

## 2019-03-08 DIAGNOSIS — I10 Essential (primary) hypertension: Secondary | ICD-10-CM | POA: Diagnosis not present

## 2019-03-08 MED ORDER — LORAZEPAM 0.5 MG PO TABS: 0.5000 mg | ORAL_TABLET | Freq: Two times a day (BID) | ORAL | 1 refills | Status: DC | PRN

## 2019-03-08 MED ORDER — ESZOPICLONE 3 MG PO TABS: 3.0000 mg | ORAL_TABLET | Freq: Every day | ORAL | 1 refills | Status: DC

## 2019-03-08 NOTE — Patient Instructions (Addendum)
Medicare Annual Wellness Visit  [facility] and the medical providers at [facility] strive to bring you the best medical care.  In doing so we not only want to address your current medical conditions and concerns but also to detect new conditions early and prevent illness, disease and health-related problems.    Medicare offers a yearly Wellness Visit which allows our clinical staff to assess your need for preventative services including immunizations, lifestyle education, counseling to decrease risk of preventable diseases and screening for fall risk and other medical concerns.    This visit is provided free of charge (no copay) for all Medicare recipients. The clinical pharmacists at [facility] have begun to conduct these Wellness Visits which will also include a thorough review of all your medications.    As you primary medical provider recommend that you make an appointment for your Annual Wellness Visit if you have not done so already this year.  You may set up this appointment before you leave today or you may call back ([phone]) and schedule an appointment.  Please make sure when you call that you mention that you are scheduling your Annual Wellness Visit with the clinical pharmacist so that the appointment may be made for the proper length of time.     Continue current medications. Continue good therapeutic lifestyle changes which include good diet and exercise. Fall precautions discussed with patient. If an FOBT was given today- please return it to our front desk. If you are over 2 years old - you may need Prevnar 61 or the adult Pneumonia vaccine.  **Flu shots are available--- please call and schedule a FLU-CLINIC appointment**  After your visit with [place] today you will receive a survey in the mail or online from [employer] regarding your care with [place]. Please take a moment to fill this out. Your feedback is very  important to [place] as you can help [place] better understand your patient needs as well as improve your experience and satisfaction. WE CARE ABOUT YOU!!!   Continue to follow-up with cardiology as planned Continue to use the walker regularly Continue to be careful and not put yourself at risk for falling Continue to practice good and thorough hand and respiratory hygiene Stay in the house especially the next few weeks Continue to drink plenty of water and fluids and stay well-hydrated We will call with lab work results including the urinalysis as soon as these reports are returned

## 2019-03-08 NOTE — Addendum Note (Signed)
Addended by: Zannie Cove on: 03/08/2019 03:43 PM   Modules accepted: Orders

## 2019-03-08 NOTE — Progress Notes (Signed)
Subjective:    Patient ID: [name] [name], female    DOB: [birth year], 78 y.o.   MRN: [id number]  HPI Pt here for follow up and management of chronic medical problems which includes hypertension and hyperlipidemia. She is taking medication regularly.  This patient has multiple diagnoses with multiple visits to multiple specialists.  The biggest issues for her or her heart with chronic diastolic heart failure and chronic upper and lower back pain with surgeries requiring fusion to help control the pain.  She has depression anxiety iron deficiency anemia colitis coronary artery disease hyperlipidemia IBS obstructive sleep apnea obesity atrial fibrillation and periodic vertigo.  She is requesting refills today on her lorazepam and Lunesta.  She does complain of some pelvic pressure.  She still off-balance somewhat.  She will be given an FOBT to return to get her DEXA scan today and will get lab work today.  The patient today denies any chest pain pressure tightness or shortness of breath anymore than usual.  She has her ongoing issues with her irritable bowel syndrome diarrhea type but no blood in the stool black tarry bowel movements or change in bowel habits.  She is passing her water without problems.  She has her ongoing back pain where she has had fusion in both the upper back and the lower back and mostly the upper back gives her the most problems.  She uses her walker constantly and very seldom gets out of the home other than to go to doctors visits.  She sees Dr. Burt Knack, her cardiologist about every 3 to 4 months.   Patient Active Problem List   Diagnosis Date Noted  . Lichen sclerosus et atrophicus of the vulva 01/03/2019  . Chronic pain syndrome 02/14/2018  . Long-term current use of opiate analgesic 02/14/2018  . Abnormal nuclear cardiac imaging test   . History of anemia 07/21/2016  . Anxiety 07/21/2016  . Chronic diastolic CHF (congestive heart failure) ([place]) 07/17/2015  . Anemia, iron  deficiency 07/02/2015  . DDD (degenerative disc disease), lumbar 03/22/2015  . Fall 03/22/2015  . 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 Difficulty hearing 03/22/2015  . Adaptive colitis 03/22/2015  . Lichen 56/31/4970  . Lumbar scoliosis 03/22/2015  . Fracture of pelvis ([place]) 03/22/2015  . Herpes zona 03/22/2015  . History of other specified conditions presenting hazards to health 03/22/2015  . Anemia 01/08/2015  . MGUS (monoclonal gammopathy of unknown significance) 01/08/2015  . Ischemic chest pain ([place])   . Permanent atrial fibrillation 12/22/2014  . CAD (coronary artery disease) 12/22/2014  . Memory disorder 12/04/2014  . Deficiency anemia 11/10/2014  . Vertigo 10/27/2014  . Angulation of spine 09/18/2014  . Metabolic syndrome 26/37/8588  . HCAP (healthcare-associated pneumonia) 03/27/2014  . S/P spinal fusion 02/23/2014  . Chronic pain associated with significant psychosocial dysfunction 02/23/2014  . Preoperative evaluation of a medical condition to rule out surgical contraindications (TAR required) 10/09/2013  . Abdominal pain 07/06/2013  . Gastroesophageal hernia 07/06/2013  . Incontinence 10/13/2012  . History of IBS 01/26/2012  . 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 EDEMA 06/25/2010  . DEGENERATIVE JOINT DISEASE 04/08/2010  . OTHER DISORDERS OF NERVOUS SYSTEM&SENSE ORGANS 04/08/2010  . Obesity (BMI 30.0-34.9) 04/04/2009  . CIRCADIAN RHYTHM SLEEP D/O DELAY SLEEP PHSE TYPE 04/04/2009  . CARPAL TUNNEL SYNDROME 04/04/2009  . Diarrhea 01/17/2009  . IRRITABLE BOWEL SYNDROME 11/13/2008  . ESOPHAGEAL STRICTURE 11/12/2008  . GERD 11/12/2008  . DUODENITIS, WITHOUT HEMORRHAGE 11/12/2008  . Diaphragmatic hernia without mention of obstruction  or gangrene 11/12/2008  . DIVERTICULOSIS, COLON 11/12/2008  . COLONIC POLYPS, HX OF 11/12/2008  . Obstructive sleep apnea 05/23/2008  . DYSPNEA 05/23/2008  . Hyperlipidemia 05/22/2008  . Depression 05/22/2008  . Essential  hypertension 05/22/2008  . INSOMNIA 05/22/2008   Outpatient Encounter Medications as of 03/08/2019  Medication Sig  . aspirin EC 81 MG tablet Take 1 tablet (81 mg total) by mouth daily.  . Calcium Citrate-Vitamin D (CALCIUM + D PO) Take 1 tablet by mouth daily.  . Cholecalciferol (VITAMIN D) 2000 units tablet Take 2,000 Units by mouth daily.  . clobetasol cream (TEMOVATE) 0.03 % Apply 1 application topically See admin instructions. Applies to vaginal area twice a week.  . diltiazem (CARDIZEM CD) 120 MG 24 hr capsule Take 1 capsule (120 mg total) by mouth 2 (two) times daily.  Marland Kitchen ELIQUIS 5 MG TABS tablet TAKE 1 TABLET TWICE A DAY  . Eszopiclone 3 MG TABS Take 1 tablet (3 mg total) by mouth at bedtime. For insomnia,take immediately before bedtime.  . fentaNYL (DURAGESIC - DOSED MCG/HR) 50 MCG/HR Place 1 patch (50 mcg total) onto the skin every 3 (three) days.  Marland Kitchen FLUoxetine (PROZAC) 40 MG capsule Take 1 capsule (40 mg total) by mouth daily.  . furosemide (LASIX) 80 MG tablet Take 1 tablet (80 mg total) by mouth daily.  Marland Kitchen HYDROcodone-acetaminophen (NORCO) 10-325 MG per tablet Take 1 tablet by mouth every 4 (four) hours as needed for moderate pain.   Marland Kitchen ketoconazole (NIZORAL) 2 % cream Apply 1 application topically daily. Apply to both feet and between toes once daily for 6 weeks  . LORazepam (ATIVAN) 0.5 MG tablet Take 1 tablet (0.5 mg total) by mouth 2 (two) times daily as needed for anxiety.  . Multiple Vitamin (MULTIVITAMIN) tablet Take 1 tablet by mouth daily. For supplement  . pantoprazole (PROTONIX) 40 MG tablet Take 1 tablet (40 mg total) by mouth 2 (two) times daily.  . potassium chloride SA (K-DUR,KLOR-CON) 20 MEQ tablet Take 1 tablet (20 mEq total) by mouth daily.  . rosuvastatin (CRESTOR) 10 MG tablet Take 1 tablet (10 mg total) by mouth as directed.  . diphenoxylate-atropine (LOMOTIL) 2.5-0.025 MG per tablet Take 1 tablet by mouth 4 (four) times daily as needed for diarrhea or loose  stools.   . meclizine (ANTIVERT) 25 MG tablet Take 1 tablet (25 mg total) by mouth 3 (three) times daily as needed for dizziness. (Patient not taking: Reported on 03/08/2019)  . methocarbamol (ROBAXIN) 500 MG tablet Take 500 mg by mouth 4 (four) times daily as needed for muscle spasms.  . nitroGLYCERIN (NITROSTAT) 0.4 MG SL tablet Place 1 tablet (0.4 mg total) under the tongue every 5 (five) minutes as needed for chest pain (up to 3 doses). (Patient not taking: Reported on 03/08/2019)   No facility-administered encounter medications on file as of 03/08/2019.       Review of Systems  Constitutional: Negative.   HENT: Negative.   Eyes: Negative.   Respiratory: Negative.   Cardiovascular: Negative.   Gastrointestinal: Negative.   Endocrine: Negative.   Genitourinary: Positive for pelvic pain (pressure (not pain)).  Musculoskeletal: Negative.   Skin: Negative.   Allergic/Immunologic: Negative.   Neurological: Negative.        Off- balance   Hematological: Negative.   Psychiatric/Behavioral: Negative.        Objective:   Physical Exam Vitals signs and nursing note reviewed.  Constitutional:      General: She is not in acute  distress.    Appearance: Normal appearance. She is well-developed. She is obese.     Comments: Patient is emotional and tearful because of her ongoing chronic pain and not being able to get out of the house anymore than she can.  She uses her walker regularly.  HENT:     Head: Normocephalic and atraumatic.     Right Ear: Tympanic membrane, ear canal and external ear normal. There is no impacted cerumen.     Left Ear: Tympanic membrane, ear canal and external ear normal. There is no impacted cerumen.     Nose: Rhinorrhea present. No congestion.     Comments: Clear rhinorrhea    Mouth/Throat:     Mouth: Mucous membranes are moist.     Pharynx: Oropharynx is clear. No oropharyngeal exudate or posterior oropharyngeal erythema.  Eyes:     General: No scleral  icterus.       Right eye: No discharge.        Left eye: No discharge.     Extraocular Movements: Extraocular movements intact.     Conjunctiva/sclera: Conjunctivae normal.     Pupils: Pupils are equal, round, and reactive to light.  Neck:     Musculoskeletal: Normal range of motion. Neck rigidity present. No muscular tenderness.     Thyroid: No thyromegaly.     Vascular: No carotid bruit or JVD.     Comments: Some decreased movement from side to side but no tenderness.  No anterior cervical adenopathy and no thyromegaly Cardiovascular:     Rate and Rhythm: Normal rate. Rhythm irregular.     Pulses: Normal pulses.     Heart sounds: Normal heart sounds. No murmur.     Comments: The heart is irregular irregular at 84/min.  Good pedal pulses with 1+ pretibial edema. Pulmonary:     Effort: Pulmonary effort is normal.     Breath sounds: Normal breath sounds. No wheezing or rales.     Comments: Clear anteriorly and posteriorly Abdominal:     General: Bowel sounds are normal. There is no distension.     Palpations: Abdomen is soft. There is no mass.     Tenderness: There is no abdominal tenderness. There is no guarding or rebound.     Comments: Abdomen is obese with some general abdominal tenderness and suprapubic tenderness.  No liver or spleen enlargement noted and no bruits  Musculoskeletal: Normal range of motion.        General: Tenderness present.     Right lower leg: Edema present.     Left lower leg: Edema present.     Comments: Range of motion is hesitant due to ongoing chronic back pain.  Patient needs assistance with getting on and off the table and laying down and sitting up.  Reflexes in the lower extremities are 2+ and equal.  Lymphadenopathy:     Cervical: No cervical adenopathy.  Skin:    General: Skin is warm and dry.     Findings: Bruising present. No lesion or rash.  Neurological:     General: No focal deficit present.     Mental Status: She is alert and oriented to  person, place, and time. Mental status is at baseline.     Cranial Nerves: No cranial nerve deficit.     Deep Tendon Reflexes: Reflexes are normal and symmetric. Reflexes normal.  Psychiatric:        Mood and Affect: Mood normal.        Behavior: Behavior normal.  Thought Content: Thought content normal.        Judgment: Judgment normal.     Comments: Mood affect and behavior are somewhat depressed.     BP 100/66 (BP Location: Left Arm)   Pulse 79   Temp (!) 97.4 F (36.3 C) (Oral)   Ht 5' 7" (1.702 m)   Wt 190 lb (86.2 kg)   BMI 29.76 kg/m        Assessment & Plan:  1. Pure hypercholesterolemia -Continue with Crestor and with as aggressive therapeutic lifestyle changes as possible including diet and exercise as possible. - Lipid panel  2. Essential hypertension -Blood pressure is good - BMP8+EGFR - Hepatic function panel  3. Vitamin D deficiency -Continue with vitamin D replacement pending results of lab work - VITAMIN D 25 Hydroxy (Vit-D Deficiency, Fractures) - DG [facility] DEXA; Future  4. Iron deficiency anemia, unspecified iron deficiency anemia type - CBC with Differential/Platelet  5. Paroxysmal atrial fibrillation ([place]) -Patient remains in atrial fibrillation at about 84/min.  She will continue to follow-up with cardiology and Dr. Burt Knack as planned. - CBC with Differential/Platelet  6. Gastroesophageal reflux disease, esophagitis presence not specified -No complaints today with reflux symptoms only ongoing complaints with loose bowel movements and IBS. - Hepatic function panel  7. Screening for osteoporosis - DG [facility] DEXA; Future  8. Postmenopausal - DG [facility] DEXA; Future  9. Pelvic pressure in female - Urinalysis, Complete - Urine Culture  Meds ordered this encounter  Medications  . LORazepam (ATIVAN) 0.5 MG tablet    Sig: Take 1 tablet (0.5 mg total) by mouth 2 (two) times daily as needed for anxiety.    Dispense:  60 tablet    Refill:  1   . Eszopiclone 3 MG TABS    Sig: Take 1 tablet (3 mg total) by mouth at bedtime. For insomnia,take immediately before bedtime.    Dispense:  90 tablet    Refill:  1   Patient Instructions                       Medicare Annual Wellness Visit  [facility] and the medical providers at [facility] strive to bring you the best medical care.  In doing so we not only want to address your current medical conditions and concerns but also to detect new conditions early and prevent illness, disease and health-related problems.    Medicare offers a yearly Wellness Visit which allows our clinical staff to assess your need for preventative services including immunizations, lifestyle education, counseling to decrease risk of preventable diseases and screening for fall risk and other medical concerns.    This visit is provided free of charge (no copay) for all Medicare recipients. The clinical pharmacists at [facility] have begun to conduct these Wellness Visits which will also include a thorough review of all your medications.    As you primary medical provider recommend that you make an appointment for your Annual Wellness Visit if you have not done so already this year.  You may set up this appointment before you leave today or you may call back ([phone]) and schedule an appointment.  Please make sure when you call that you mention that you are scheduling your Annual Wellness Visit with the clinical pharmacist so that the appointment may be made for the proper length of time.     Continue current medications. Continue good therapeutic lifestyle changes which include good diet and exercise.  Fall precautions discussed with patient. If an FOBT was given today- please return it to our front desk. If you are over 43 years old - you may need Prevnar 41 or the adult Pneumonia vaccine.  **Flu shots are available--- please call and schedule a FLU-CLINIC appointment**   After your visit with [place] today you will receive a survey in the mail or online from [employer] regarding your care with [place]. Please take a moment to fill this out. Your feedback is very important to [place] as you can help [place] better understand your patient needs as well as improve your experience and satisfaction. WE CARE ABOUT YOU!!!   Continue to follow-up with cardiology as planned Continue to use the walker regularly Continue to be careful and not put yourself at risk for falling Continue to practice good and thorough hand and respiratory hygiene Stay in the house especially the next few weeks Continue to drink plenty of water and fluids and stay well-hydrated We will call with lab work results including the urinalysis as soon as these reports are returned  Arrie Senate MD

## 2019-03-09 LAB — LIPID PANEL
Chol/HDL Ratio: 2.4 ratio (ref 0.0–4.4)
Cholesterol, Total: 155 mg/dL (ref 100–199)
HDL: 64 mg/dL (ref >39)
LDL Calculated: 59 mg/dL (ref 0–99)
TRIGLYCERIDES: 159 mg/dL — AB (ref 0–149)
VLDL Cholesterol Cal: 32 mg/dL (ref 5–40)

## 2019-03-09 LAB — HEPATIC FUNCTION PANEL
ALT: 23 IU/L (ref 0–32)
AST: 28 IU/L (ref 0–40)
Albumin: 4.4 g/dL (ref 3.7–4.7)
Alkaline Phosphatase: 98 IU/L (ref 39–117)
Bilirubin Total: 0.5 mg/dL (ref 0.0–1.2)
Bilirubin, Direct: 0.18 mg/dL (ref 0.00–0.40)
Total Protein: 6.6 g/dL (ref 6.0–8.5)

## 2019-03-09 LAB — CBC WITH DIFFERENTIAL/PLATELET
Basophils Absolute: 0 10*3/uL (ref 0.0–0.2)
Basos: 0 %
EOS (ABSOLUTE): 0.1 10*3/uL (ref 0.0–0.4)
Eos: 1 %
Hematocrit: 42.5 % (ref 34.0–46.6)
Hemoglobin: 13.8 g/dL (ref 11.1–15.9)
Immature Grans (Abs): 0 10*3/uL (ref 0.0–0.1)
Immature Granulocytes: 0 %
LYMPHS ABS: 2.5 10*3/uL (ref 0.7–3.1)
Lymphs: 32 %
MCH: 30.6 pg (ref 26.6–33.0)
MCHC: 32.5 g/dL (ref 31.5–35.7)
MCV: 94 fL (ref 79–97)
Monocytes Absolute: 0.6 10*3/uL (ref 0.1–0.9)
Monocytes: 8 %
Neutrophils Absolute: 4.5 10*3/uL (ref 1.4–7.0)
Neutrophils: 59 %
PLATELETS: 243 10*3/uL (ref 150–450)
RBC: 4.51 x10E6/uL (ref 3.77–5.28)
RDW: 12.9 % (ref 11.7–15.4)
WBC: 7.7 10*3/uL (ref 3.4–10.8)

## 2019-03-09 LAB — BMP8+EGFR
BUN/Creatinine Ratio: 15 (ref 12–28)
BUN: 14 mg/dL (ref 8–27)
CO2: 29 mmol/L (ref 20–29)
Calcium: 9.7 mg/dL (ref 8.7–10.3)
Chloride: 97 mmol/L (ref 96–106)
Creatinine, Ser: 0.91 mg/dL (ref 0.57–1.00)
GFR calc Af Amer: 69 mL/min/{1.73_m2} (ref >59)
GFR calc non Af Amer: 60 mL/min/{1.73_m2} (ref >59)
GLUCOSE: 110 mg/dL — AB (ref 65–99)
POTASSIUM: 4.3 mmol/L (ref 3.5–5.2)
Sodium: 142 mmol/L (ref 134–144)

## 2019-03-09 LAB — VITAMIN D 25 HYDROXY (VIT D DEFICIENCY, FRACTURES): Vit D, 25-Hydroxy: 31.9 ng/mL (ref 30.0–100.0)

## 2019-03-10 LAB — URINE CULTURE

## 2019-04-04 ENCOUNTER — Ambulatory Visit: Payer: Medicare HMO | Admitting: Podiatry

## 2019-04-10 DIAGNOSIS — R1312 Dysphagia, oropharyngeal phase: Secondary | ICD-10-CM | POA: Diagnosis not present

## 2019-04-10 DIAGNOSIS — R32 Unspecified urinary incontinence: Secondary | ICD-10-CM | POA: Diagnosis not present

## 2019-04-10 DIAGNOSIS — I4891 Unspecified atrial fibrillation: Secondary | ICD-10-CM | POA: Diagnosis not present

## 2019-04-10 DIAGNOSIS — R159 Full incontinence of feces: Secondary | ICD-10-CM | POA: Diagnosis not present

## 2019-04-10 DIAGNOSIS — K582 Mixed irritable bowel syndrome: Secondary | ICD-10-CM | POA: Diagnosis not present

## 2019-04-10 DIAGNOSIS — Z800 Family history of malignant neoplasm of digestive organs: Secondary | ICD-10-CM | POA: Diagnosis not present

## 2019-04-10 DIAGNOSIS — Z9049 Acquired absence of other specified parts of digestive tract: Secondary | ICD-10-CM | POA: Diagnosis not present

## 2019-04-10 DIAGNOSIS — Z7901 Long term (current) use of anticoagulants: Secondary | ICD-10-CM | POA: Diagnosis not present

## 2019-04-10 DIAGNOSIS — R439 Unspecified disturbances of smell and taste: Secondary | ICD-10-CM | POA: Diagnosis not present

## 2019-04-18 DIAGNOSIS — R1312 Dysphagia, oropharyngeal phase: Secondary | ICD-10-CM | POA: Diagnosis not present

## 2019-04-18 DIAGNOSIS — R1314 Dysphagia, pharyngoesophageal phase: Secondary | ICD-10-CM | POA: Diagnosis not present

## 2019-04-18 DIAGNOSIS — R633 Feeding difficulties: Secondary | ICD-10-CM | POA: Diagnosis not present

## 2019-05-02 ENCOUNTER — Other Ambulatory Visit: Payer: Self-pay

## 2019-05-02 ENCOUNTER — Encounter: Payer: Self-pay | Admitting: Family Medicine

## 2019-05-02 ENCOUNTER — Ambulatory Visit: Payer: Medicare HMO | Admitting: Family Medicine

## 2019-05-02 VITALS — BP 120/73 | HR 85 | Temp 97.9°F | Ht 67.0 in | Wt 195.0 lb

## 2019-05-02 DIAGNOSIS — M25511 Pain in right shoulder: Secondary | ICD-10-CM | POA: Diagnosis not present

## 2019-05-02 DIAGNOSIS — M25551 Pain in right hip: Secondary | ICD-10-CM

## 2019-05-02 MED ORDER — BETAMETHASONE SOD PHOS & ACET 6 (3-3) MG/ML IJ SUSP
6.0000 mg | Freq: Once | INTRAMUSCULAR | Status: AC
  Administered 2019-05-02: 6 mg via INTRAMUSCULAR

## 2019-05-02 NOTE — Progress Notes (Signed)
Subjective:  Patient ID: [name] [name], female    DOB: 09/30/1941  Age: 78 y.o. MRN: [id number]  CC: Hip Pain and Shoulder Pain   HPI [name] [name] presents for multiple chronic pains.  Most recently the right buttocks and hip have flared as well as the right shoulder.  There is no known injury.  She has had shots given to her by Ortho in the past and would like to have that repeated today.  The pain is moderately severe.  She has a trip to [place] to a dentist in that area planned for tomorrow and needs some relief prior to the trip.  She requests that the shot be given here instead of having to wait to go to the orthopedist.  She is treated with anticoagulation due to atrial fibrillation.  She denies any palpitations or rapid heartbeat recently.  She does take diltiazem for rate control.  Depression screen [facility] 2/9 05/02/2019 03/08/2019 11/02/2018  Decreased Interest 1 0 0  Down, Depressed, Hopeless 0 1 1  PHQ - 2 Score 1 1 1   Altered sleeping 0 - -  Tired, decreased energy 1 - -  Change in appetite 0 - -  Feeling bad or failure about yourself  0 - -  Trouble concentrating 1 - -  Moving slowly or fidgety/restless 0 - -  Suicidal thoughts 0 - -  PHQ-9 Score 3 - -  Difficult doing work/chores Not difficult at all - -  Some recent data might be hidden    History [name] [name] has a past medical history of Abnormal nuclear cardiac imaging test, Anemia, Anemia, iron deficiency (07/02/2015), Anxiety, Arthritis, Bergmann's syndrome (03/22/2015), CAD (coronary artery disease), Chronic atrial fibrillation, Chronic back pain greater than 3 months duration, Chronic diastolic CHF (congestive heart failure) ([place]), Circadian rhythm sleep disorder, CTS (carpal tunnel syndrome), Deficiency anemia (11/10/2014), Depression, Diarrhea, Diverticulitis of colon, Esophageal stricture, Gastritis, Gastroesophageal hernia (07/06/2013), GERD (gastroesophageal reflux disease), Hiatal hernia, History of blood  transfusion, History of echocardiogram, HTN (hypertension), Hyperlipidemia, IBS (irritable bowel syndrome), Incontinence (10/13/2012), Insomnia, Ischemic chest pain ([place]), Memory disorder (73/22/0254), Metabolic syndrome (01/27/622), MGUS (monoclonal gammopathy of unknown significance) (dx'd 11/2014), Multiple falls, Obesity, Obstructive sleep apnea, Orthostasis, PAT (paroxysmal atrial tachycardia) ([place]), Personal history of colonic polyps (10/25/2011 & 12/02/11), Pneumonia (03/2014), Sinus bradycardia, and Stroke ([place]) (early 2000's).   She has a past surgical history that includes Carpal tunnel release (Right, 1980's); Total knee arthroplasty (Bilateral, 1990's - 2000's); Cataract extraction (Bilateral); left heart catheterization with coronary angiogram (N/A, 12/27/2014); Percutaneous coronary rotoblator intervention (pci-r) (01/07/2015); Cardiac catheterization (12/27/2014); Coronary angioplasty with stent (01/07/2015); Laparoscopic cholecystectomy (2003); Joint replacement; Knee arthroscopy (Left, 1995); Back surgery; Posterior lumbar fusion (01/2015); Posterior fusion thoracic spine (08/2015); Dilation and curettage of uterus; Cataract extraction w/ intraocular lens  implant, bilateral (2000's); Esophagogastroduodenoscopy (egd) with esophageal dilation ("several times"); Bone marrow biopsy (11/2014); percutaneous coronary rotoblator intervention (pci-r) (N/A, 01/07/2015); Coronary stent placement; Cardioversion (N/A, 02/13/2016); and Cardiac catheterization (N/A, 11/25/2016).   Her family history includes Colon cancer in her sister and sister; Coronary artery disease in her brother, father, and mother; Emphysema in her brother and sister; Peripheral vascular disease in her father; Sleep apnea in her son.She reports that she has never smoked. She has never used smokeless tobacco. She reports that she does not drink alcohol or use drugs.    ROS Review of Systems  Constitutional: Negative.   HENT: Negative.   Eyes:  Negative for visual disturbance.  Respiratory: Negative for shortness  of breath.   Cardiovascular: Negative for chest pain.  Gastrointestinal: Negative for abdominal pain.  Musculoskeletal: Negative for arthralgias.    Objective:  BP 120/73    Pulse 85    Temp 97.9 F (36.6 C) (Oral)    Ht 5' 7"  (1.702 m)    Wt 195 lb (88.5 kg)    BMI 30.54 kg/m   BP Readings from Last 3 Encounters:  05/02/19 120/73  03/08/19 100/66  11/28/18 116/62    Wt Readings from Last 3 Encounters:  05/02/19 195 lb (88.5 kg)  03/08/19 190 lb (86.2 kg)  11/28/18 197 lb 6.4 oz (89.5 kg)     Physical Exam Constitutional:      General: She is not in acute distress.    Appearance: She is well-developed.  Cardiovascular:     Rate and Rhythm: Normal rate and regular rhythm.  Pulmonary:     Breath sounds: Normal breath sounds.  Musculoskeletal: Normal range of motion.        General: Tenderness (From the right sciatic notch to the acetabular and posterior r trochanter) present.     Right lower leg: No edema.     Left lower leg: No edema.     Comments: There is full passive range of motion of the right shoulder.  No crepitus noted.  The right upper extremity is neurovascularly intact as is the right lower extremity.  Skin:    General: Skin is warm and dry.  Neurological:     Mental Status: She is alert and oriented to person, place, and time.       Assessment & Plan:   [name] [name] was seen today for hip pain and shoulder pain.  Diagnoses and all orders for this visit:  Pain of right hip joint -     betamethasone acetate-betamethasone sodium phosphate (CELESTONE) injection 6 mg       I have discontinued [name] [name]'s meclizine. I am also having her maintain her multivitamin, diphenoxylate-atropine, fentaNYL, HYDROcodone-acetaminophen, Calcium Citrate-Vitamin D (CALCIUM + D PO), Vitamin D, methocarbamol, aspirin EC, clobetasol cream, nitroGLYCERIN, potassium chloride SA, rosuvastatin,  FLUoxetine, diltiazem, pantoprazole, furosemide, ketoconazole, Eliquis, Eszopiclone, and LORazepam. We administered betamethasone acetate-betamethasone sodium phosphate.  Allergies as of 05/02/2019      Reactions   Buprenorphine Hcl Itching   Cymbalta [duloxetine Hcl] Other (See Comments)   Other reaction(s): Other (See Comments) Personality changes - crying  2014   Morphine And Related Itching   Oxycodone-acetaminophen Other (See Comments)   Personality changes    Valium [diazepam] Other (See Comments)   Personality changes - "in another world" ;  Hallucinations  2015   Statins Other (See Comments)   Other reaction(s): Other (See Comments) Muscle weakness Muscle weakness   Altace [ramipril] Other (See Comments)   unknown   Floxin [ofloxacin] Other (See Comments)   unknown   Hydromorphone Other (See Comments)   Other reaction(s): Confusion (intolerance) unknown   Lovaza [omega-3-acid Ethyl Esters] Other (See Comments)   Muscle weakness    Trilipix [choline Fenofibrate] Other (See Comments)   Muscle weakness    Zetia [ezetimibe] Other (See Comments)   Muscle weakness       Medication List       Accurate as of May 02, 2019  5:19 PM. If you have any questions, ask your nurse or doctor.        STOP taking these medications   meclizine 25 MG tablet Commonly known as:  ANTIVERT Stopped by:  Claretta Fraise, MD  TAKE these medications   aspirin EC 81 MG tablet Take 1 tablet (81 mg total) by mouth daily.   CALCIUM + D PO Take 1 tablet by mouth daily.   clobetasol cream 0.05 % Commonly known as:  TEMOVATE Apply 1 application topically See admin instructions. Applies to vaginal area twice a week.   diltiazem 120 MG 24 hr capsule Commonly known as:  CARDIZEM CD Take 1 capsule (120 mg total) by mouth 2 (two) times daily.   diphenoxylate-atropine 2.5-0.025 MG tablet Commonly known as:  LOMOTIL Take 1 tablet by mouth 4 (four) times daily as needed for diarrhea or loose  stools.   Eliquis 5 MG Tabs tablet Generic drug:  apixaban TAKE 1 TABLET TWICE A DAY   Eszopiclone 3 MG Tabs Take 1 tablet (3 mg total) by mouth at bedtime. For insomnia,take immediately before bedtime.   fentaNYL 50 MCG/HR Commonly known as:  DURAGESIC Place 1 patch (50 mcg total) onto the skin every 3 (three) days.   FLUoxetine 40 MG capsule Commonly known as:  PROZAC Take 1 capsule (40 mg total) by mouth daily.   furosemide 80 MG tablet Commonly known as:  LASIX Take 1 tablet (80 mg total) by mouth daily.   HYDROcodone-acetaminophen 10-325 MG tablet Commonly known as:  NORCO Take 1 tablet by mouth every 4 (four) hours as needed for moderate pain.   ketoconazole 2 % cream Commonly known as:  NIZORAL Apply 1 application topically daily. Apply to both feet and between toes once daily for 6 weeks   LORazepam 0.5 MG tablet Commonly known as:  ATIVAN Take 1 tablet (0.5 mg total) by mouth 2 (two) times daily as needed for anxiety.   methocarbamol 500 MG tablet Commonly known as:  ROBAXIN Take 500 mg by mouth 4 (four) times daily as needed for muscle spasms.   multivitamin tablet Take 1 tablet by mouth daily. For supplement   nitroGLYCERIN 0.4 MG SL tablet Commonly known as:  Nitrostat Place 1 tablet (0.4 mg total) under the tongue every 5 (five) minutes as needed for chest pain (up to 3 doses).   pantoprazole 40 MG tablet Commonly known as:  PROTONIX Take 1 tablet (40 mg total) by mouth 2 (two) times daily.   potassium chloride SA 20 MEQ tablet Commonly known as:  K-DUR Take 1 tablet (20 mEq total) by mouth daily.   rosuvastatin 10 MG tablet Commonly known as:  CRESTOR Take 1 tablet (10 mg total) by mouth as directed.   Vitamin D 50 MCG (2000 UT) tablet Take 2,000 Units by mouth daily.        Follow-up: Return if symptoms worsen or fail to improve.  Claretta Fraise, M.D.

## 2019-05-16 DIAGNOSIS — M48061 Spinal stenosis, lumbar region without neurogenic claudication: Secondary | ICD-10-CM | POA: Diagnosis not present

## 2019-05-16 DIAGNOSIS — Z79891 Long term (current) use of opiate analgesic: Secondary | ICD-10-CM | POA: Diagnosis not present

## 2019-05-16 DIAGNOSIS — M5416 Radiculopathy, lumbar region: Secondary | ICD-10-CM | POA: Diagnosis not present

## 2019-05-16 DIAGNOSIS — M545 Low back pain: Secondary | ICD-10-CM | POA: Diagnosis not present

## 2019-05-16 DIAGNOSIS — G894 Chronic pain syndrome: Secondary | ICD-10-CM | POA: Diagnosis not present

## 2019-05-19 ENCOUNTER — Other Ambulatory Visit: Payer: Self-pay | Admitting: Chiropractic Medicine

## 2019-05-19 ENCOUNTER — Ambulatory Visit
Admission: RE | Admit: 2019-05-19 | Discharge: 2019-05-19 | Disposition: A | Discharge status: Home / Self Care | Payer: Medicare HMO | Source: Ambulatory Visit | Attending: Chiropractic Medicine | Admitting: Chiropractic Medicine

## 2019-05-19 ENCOUNTER — Other Ambulatory Visit: Payer: Self-pay

## 2019-05-19 DIAGNOSIS — M545 Low back pain: Secondary | ICD-10-CM | POA: Diagnosis not present

## 2019-05-19 DIAGNOSIS — M5416 Radiculopathy, lumbar region: Secondary | ICD-10-CM

## 2019-05-24 ENCOUNTER — Telehealth: Payer: Self-pay | Admitting: Family Medicine

## 2019-05-24 MED ORDER — TRAZODONE HCL 50 MG PO TABS: 50.0000 mg | ORAL_TABLET | Freq: Every evening | ORAL | 0 refills | Status: DC | PRN

## 2019-05-24 NOTE — Telephone Encounter (Signed)
Try trazodone 50 mg #30 1 daily at bedtime until the Lunesta can generic can be reordered

## 2019-05-24 NOTE — Telephone Encounter (Signed)
New rx sent to mail order with notes to replace lunesta

## 2019-06-09 ENCOUNTER — Ambulatory Visit: Payer: Medicare HMO | Admitting: Podiatry

## 2019-06-09 ENCOUNTER — Other Ambulatory Visit: Payer: Self-pay

## 2019-06-09 ENCOUNTER — Encounter: Payer: Self-pay | Admitting: Podiatry

## 2019-06-09 VITALS — Temp 97.3°F

## 2019-06-09 DIAGNOSIS — G609 Hereditary and idiopathic neuropathy, unspecified: Secondary | ICD-10-CM | POA: Diagnosis not present

## 2019-06-09 DIAGNOSIS — M79675 Pain in left toe(s): Secondary | ICD-10-CM | POA: Diagnosis not present

## 2019-06-09 DIAGNOSIS — L84 Corns and callosities: Secondary | ICD-10-CM | POA: Diagnosis not present

## 2019-06-09 DIAGNOSIS — M79674 Pain in right toe(s): Secondary | ICD-10-CM

## 2019-06-09 DIAGNOSIS — B351 Tinea unguium: Secondary | ICD-10-CM | POA: Diagnosis not present

## 2019-06-09 NOTE — Patient Instructions (Signed)

## 2019-06-15 ENCOUNTER — Ambulatory Visit: Payer: Medicare HMO | Admitting: Family Medicine

## 2019-06-20 NOTE — Progress Notes (Signed)
Subjective: [name] [name] presents today with history of neuropathy. Patient seen for follow up of chronic, painful mycotic toenails and callus which interfere with daily activities and routine tasks.  Pain is aggravated when wearing enclosed shoe gear. Pain is getting progressively worse and relieved with periodic professional debridement.   [name], MD is her PCP.    Current Outpatient Medications:  .  aspirin EC 81 MG tablet, Take 1 tablet (81 mg total) by mouth daily., Disp: 90 tablet, Rfl: 3 .  Calcium Citrate-Vitamin D (CALCIUM + D PO), Take 1 tablet by mouth daily., Disp: , Rfl:  .  Cholecalciferol (VITAMIN D) 2000 units tablet, Take 2,000 Units by mouth daily., Disp: , Rfl:  .  clobetasol cream (TEMOVATE) 2.37 %, Apply 1 application topically See admin instructions. Applies to vaginal area twice a week., Disp: , Rfl:  .  diltiazem (CARDIZEM CD) 120 MG 24 hr capsule, Take 1 capsule (120 mg total) by mouth 2 (two) times daily., Disp: 180 capsule, Rfl: 3 .  diphenoxylate-atropine (LOMOTIL) 2.5-0.025 MG per tablet, Take 1 tablet by mouth 4 (four) times daily as needed for diarrhea or loose stools. , Disp: , Rfl:  .  ELIQUIS 5 MG TABS tablet, TAKE 1 TABLET TWICE A DAY, Disp: 180 tablet, Rfl: 2 .  Eszopiclone 3 MG TABS, Take 1 tablet (3 mg total) by mouth at bedtime. For insomnia,take immediately before bedtime., Disp: 90 tablet, Rfl: 1 .  fentaNYL (DURAGESIC - DOSED MCG/HR) 50 MCG/HR, Place 1 patch (50 mcg total) onto the skin every 3 (three) days., Disp: 10 patch, Rfl: 0 .  FLUoxetine (PROZAC) 40 MG capsule, Take 1 capsule (40 mg total) by mouth daily., Disp: 90 capsule, Rfl: 3 .  furosemide (LASIX) 80 MG tablet, Take 1 tablet (80 mg total) by mouth daily., Disp: 90 tablet, Rfl: 3 .  HYDROcodone-acetaminophen (NORCO) 10-325 MG per tablet, Take 1 tablet by mouth every 4 (four) hours as needed for moderate pain. , Disp: , Rfl:  .  ketoconazole (NIZORAL) 2 % cream, Apply 1 application  topically daily. Apply to both feet and between toes once daily for 6 weeks, Disp: 30 g, Rfl: 1 .  LORazepam (ATIVAN) 0.5 MG tablet, Take 1 tablet (0.5 mg total) by mouth 2 (two) times daily as needed for anxiety., Disp: 60 tablet, Rfl: 1 .  methocarbamol (ROBAXIN) 500 MG tablet, Take 500 mg by mouth 4 (four) times daily as needed for muscle spasms., Disp: , Rfl:  .  Multiple Vitamin (MULTIVITAMIN) tablet, Take 1 tablet by mouth daily. For supplement, Disp: , Rfl:  .  nitroGLYCERIN (NITROSTAT) 0.4 MG SL tablet, Place 1 tablet (0.4 mg total) under the tongue every 5 (five) minutes as needed for chest pain (up to 3 doses). (Patient not taking: Reported on 03/08/2019), Disp: 25 tablet, Rfl: 3 .  oxyCODONE (OXY IR/ROXICODONE) 5 MG immediate release tablet, TAKE 1 TABLET BY MOUTH FOUR TIMES A DAY AS NEEDED, Disp: , Rfl:  .  oxyCODONE-acetaminophen (PERCOCET/ROXICET) 5-325 MG tablet, Take by mouth., Disp: , Rfl:  .  pantoprazole (PROTONIX) 40 MG tablet, Take 1 tablet (40 mg total) by mouth 2 (two) times daily., Disp: 180 tablet, Rfl: 3 .  potassium chloride SA (K-DUR,KLOR-CON) 20 MEQ tablet, Take 1 tablet (20 mEq total) by mouth daily., Disp: 90 tablet, Rfl: 3 .  predniSONE (DELTASONE) 20 MG tablet, TAKE 1 TABLET BY MOUTH EVERY DAY IN THE MORNING, Disp: , Rfl:  .  rosuvastatin (CRESTOR) 10 MG  tablet, Take 1 tablet (10 mg total) by mouth as directed., Disp: 90 tablet, Rfl: 1 .  traZODone (DESYREL) 50 MG tablet, Take 1 tablet (50 mg total) by mouth at bedtime as needed for sleep., Disp: 90 tablet, Rfl: 0  Allergies  Allergen Reactions  . Buprenorphine Hcl Itching  . Cymbalta [Duloxetine Hcl] Other (See Comments)    Other reaction(s): Other (See Comments) Personality changes - crying  2014  . Morphine And Related Itching  . Oxycodone-Acetaminophen Other (See Comments)    Personality changes   . Valium [Diazepam] Other (See Comments)    Personality changes - "in another world" ;  Hallucinations  2015  .  Statins Other (See Comments)    Other reaction(s): Other (See Comments) Muscle weakness Muscle weakness  . Altace [Ramipril] Other (See Comments)    unknown  . Floxin [Ofloxacin] Other (See Comments)    unknown  . Hydromorphone Other (See Comments)    Other reaction(s): Confusion (intolerance) unknown  . Lovaza [Omega-3-Acid Ethyl Esters] Other (See Comments)    Muscle weakness   . Trilipix [Choline Fenofibrate] Other (See Comments)    Muscle weakness   . Zetia [Ezetimibe] Other (See Comments)    Muscle weakness     Objective: Vitals:   06/09/19 1531  Temp: (!) 97.3 F (36.3 C)    Vascular Examination: Capillary refill time immediate x 10 digits.  Dorsalis pedis pulses 2/4 b/l.  Posterior tibial pulses 1/4 b/l.  Digital hair present x 10 digits.  Skin temperature WNL b/l.  Dermatological Examination: Skin with normal turgor, texture and tone b/l.  Toenails 1-5 b/l discolored, thick, dystrophic with subungual debris and pain with palpation to nailbeds due to thickness of nails.  Hyperkeratotic lesion(s) submet head 5 right. No erythema, no edema, no drainage, no flocculence noted.   Musculoskeletal: Muscle strength 5/5 to all LE muscle groups.  Hammertoe 2nd digit right foot.   Ambulates with walker.  Neurological: Sensation intact with 10 gram monofilament.  Vibratory sensation diminished b/l.  Assessment: 1. Painful onychomycosis toenails 1-5 b/l 2. Callus submet head 5 right foot 3. Neuropathy   Plan: 1. Toenails 1-5 b/l were debrided in length and girth without iatrogenic bleeding. 2. Calluses pared submetatarsal head(s) 5 right foot utilizing sterile scalpel blade without incident.  3. Patient to continue soft, supportive shoe gear daily. 4. Patient to report any pedal injuries to medical professional immediately. 5. Follow up 3 months.  6. Patient/POA to call should there be a concern in the interim.

## 2019-06-22 DIAGNOSIS — M79661 Pain in right lower leg: Secondary | ICD-10-CM | POA: Diagnosis not present

## 2019-06-22 DIAGNOSIS — M79604 Pain in right leg: Secondary | ICD-10-CM | POA: Diagnosis not present

## 2019-06-22 DIAGNOSIS — M79662 Pain in left lower leg: Secondary | ICD-10-CM | POA: Diagnosis not present

## 2019-06-22 DIAGNOSIS — M79605 Pain in left leg: Secondary | ICD-10-CM | POA: Diagnosis not present

## 2019-06-23 ENCOUNTER — Other Ambulatory Visit: Payer: Self-pay

## 2019-06-23 ENCOUNTER — Other Ambulatory Visit (HOSPITAL_COMMUNITY): Payer: Self-pay | Admitting: Chiropractic Medicine

## 2019-06-23 ENCOUNTER — Ambulatory Visit (HOSPITAL_COMMUNITY)
Admission: RE | Admit: 2019-06-23 | Discharge: 2019-06-23 | Disposition: A | Discharge status: Home / Self Care | Payer: Medicare HMO | Source: Ambulatory Visit | Attending: Chiropractic Medicine | Admitting: Chiropractic Medicine

## 2019-06-23 DIAGNOSIS — M7989 Other specified soft tissue disorders: Secondary | ICD-10-CM

## 2019-06-23 DIAGNOSIS — M79604 Pain in right leg: Secondary | ICD-10-CM | POA: Diagnosis not present

## 2019-06-23 NOTE — Progress Notes (Signed)
Lower extremity venous has been completed.   Preliminary results in CV Proc.   Abram Sander 06/23/2019 1:16 PM

## 2019-06-28 ENCOUNTER — Other Ambulatory Visit: Payer: Self-pay | Admitting: Family Medicine

## 2019-07-03 ENCOUNTER — Telehealth: Payer: Self-pay | Admitting: Family Medicine

## 2019-07-03 NOTE — Telephone Encounter (Signed)
Pt appt  Made

## 2019-07-19 ENCOUNTER — Ambulatory Visit: Payer: Medicare HMO | Admitting: Family Medicine

## 2019-07-19 ENCOUNTER — Other Ambulatory Visit: Payer: Self-pay

## 2019-07-20 ENCOUNTER — Telehealth: Payer: Self-pay | Admitting: Hematology

## 2019-07-20 ENCOUNTER — Encounter: Payer: Self-pay | Admitting: Family Medicine

## 2019-07-20 ENCOUNTER — Ambulatory Visit: Payer: Medicare HMO | Admitting: Family Medicine

## 2019-07-20 VITALS — BP 100/66 | HR 87 | Temp 97.5°F | Ht 67.0 in | Wt 191.2 lb

## 2019-07-20 DIAGNOSIS — F331 Major depressive disorder, recurrent, moderate: Secondary | ICD-10-CM | POA: Diagnosis not present

## 2019-07-20 DIAGNOSIS — I480 Paroxysmal atrial fibrillation: Secondary | ICD-10-CM | POA: Diagnosis not present

## 2019-07-20 DIAGNOSIS — K219 Gastro-esophageal reflux disease without esophagitis: Secondary | ICD-10-CM

## 2019-07-20 DIAGNOSIS — I10 Essential (primary) hypertension: Secondary | ICD-10-CM

## 2019-07-20 DIAGNOSIS — I5032 Chronic diastolic (congestive) heart failure: Secondary | ICD-10-CM | POA: Diagnosis not present

## 2019-07-20 DIAGNOSIS — I4821 Permanent atrial fibrillation: Secondary | ICD-10-CM

## 2019-07-20 DIAGNOSIS — I482 Chronic atrial fibrillation, unspecified: Secondary | ICD-10-CM

## 2019-07-20 DIAGNOSIS — R69 Illness, unspecified: Secondary | ICD-10-CM | POA: Diagnosis not present

## 2019-07-20 DIAGNOSIS — I2510 Atherosclerotic heart disease of native coronary artery without angina pectoris: Secondary | ICD-10-CM

## 2019-07-20 DIAGNOSIS — E782 Mixed hyperlipidemia: Secondary | ICD-10-CM

## 2019-07-20 DIAGNOSIS — F419 Anxiety disorder, unspecified: Secondary | ICD-10-CM | POA: Diagnosis not present

## 2019-07-20 DIAGNOSIS — M25552 Pain in left hip: Secondary | ICD-10-CM

## 2019-07-20 DIAGNOSIS — G4733 Obstructive sleep apnea (adult) (pediatric): Secondary | ICD-10-CM | POA: Diagnosis not present

## 2019-07-20 DIAGNOSIS — Z7689 Persons encountering health services in other specified circumstances: Secondary | ICD-10-CM | POA: Diagnosis not present

## 2019-07-20 DIAGNOSIS — R739 Hyperglycemia, unspecified: Secondary | ICD-10-CM | POA: Diagnosis not present

## 2019-07-20 MED ORDER — BUPROPION HCL ER (XL) 150 MG PO TB24: 150.0000 mg | ORAL_TABLET | Freq: Every day | ORAL | 3 refills | Status: DC

## 2019-07-20 MED ORDER — FUROSEMIDE 80 MG PO TABS: 80.0000 mg | ORAL_TABLET | Freq: Every day | ORAL | 3 refills | Status: DC

## 2019-07-20 MED ORDER — FLUOXETINE HCL 40 MG PO CAPS: 40.0000 mg | ORAL_CAPSULE | Freq: Every day | ORAL | 3 refills | Status: DC

## 2019-07-20 MED ORDER — ELIQUIS 5 MG PO TABS: 5.0000 mg | ORAL_TABLET | Freq: Two times a day (BID) | ORAL | 2 refills | Status: DC

## 2019-07-20 MED ORDER — POTASSIUM CHLORIDE CRYS ER 20 MEQ PO TBCR: 20.0000 meq | EXTENDED_RELEASE_TABLET | Freq: Every day | ORAL | 3 refills | Status: DC

## 2019-07-20 MED ORDER — PANTOPRAZOLE SODIUM 40 MG PO TBEC: 40.0000 mg | DELAYED_RELEASE_TABLET | Freq: Two times a day (BID) | ORAL | 3 refills | Status: DC

## 2019-07-20 MED ORDER — ESZOPICLONE 3 MG PO TABS: 3.0000 mg | ORAL_TABLET | Freq: Every day | ORAL | 1 refills | Status: DC

## 2019-07-20 MED ORDER — DILTIAZEM HCL ER COATED BEADS 120 MG PO CP24: 120.0000 mg | ORAL_CAPSULE | Freq: Two times a day (BID) | ORAL | 3 refills | Status: DC

## 2019-07-20 MED ORDER — ROSUVASTATIN CALCIUM 10 MG PO TABS: ORAL_TABLET | ORAL | 3 refills | Status: DC

## 2019-07-20 NOTE — Progress Notes (Signed)
BP 100/66    Pulse 87    Temp (!) 97.5 F (36.4 C) (Temporal)    Ht _0  (1.702 m)    Wt 191 lb 3.2 oz (86.7 kg)    BMI 29.95 kg/m    Subjective:   Patient ID: [name] [name], female    DOB: 1941/09/17, 78 y.o.   MRN: [id number]  HPI: Ellison Leisure Cortina is a 78 y.o. female presenting on 07/20/2019 for Establish Care (4 month follow up), Extremity Weakness (Patient states it has been ongoing but has gotten worse over the last few months.), and Depression (Has gotten worse in the last few months.)   HPI afib Patient is coming in to establish care with me as her main provider at this point.  She currently has been diagnosed with chronic A. fib and is on diltiazem and Eliquis  Depression Patient is coming in for recheck on depression and anxiety as well.  She currently takes fluoxetine.  She says her anxiety has not been doing well on the fluoxetine alone.  She has been on the fluoxetine for quite many years and it had worked well but recently with the coronavirus everything going on and she is feeling more anxious especially and somewhat more depressed.  She denies any suicidal ideations or thoughts of hurting self. Depression screen [facility] 2/9 07/20/2019 05/02/2019 03/08/2019 11/02/2018 06/21/2018  Decreased Interest 2 1 0 0 0  Down, Depressed, Hopeless 3 0 _1 PHQ - 2 Score _2 Altered sleeping 0 0 - - -  Tired, decreased energy 3 1 - - -  Change in appetite 0 0 - - -  Feeling bad or failure about yourself  3 0 - - -  Trouble concentrating 1 1 - - -  Moving slowly or fidgety/restless 0 0 - - -  Suicidal thoughts 0 0 - - -  PHQ-9 Score 12 3 - - -  Difficult doing work/chores - Not difficult at all - - -  Some recent data might be hidden    Left hip pain Patient comes in complaining of left hip pain that has been bothering her off and on over the past few years at her hip has been bothering her more over the past few months.  She feels like her left hip sometimes gives out on her and  feels weak in that leg because of the left hip pain.  Relevant past medical, surgical, family and social history reviewed and updated as indicated. Interim medical history since our last visit reviewed. Allergies and medications reviewed and updated.  Review of Systems  Constitutional: Negative for chills and fever.  HENT: Negative for congestion, ear discharge and ear pain.   Eyes: Negative for redness and visual disturbance.  Respiratory: Negative for chest tightness and shortness of breath.   Cardiovascular: Negative for chest pain and leg swelling.  Genitourinary: Negative for difficulty urinating and dysuria.  Musculoskeletal: Positive for arthralgias. Negative for back pain and gait problem.  Skin: Negative for rash.  Neurological: Negative for light-headedness and headaches.  Psychiatric/Behavioral: Positive for dysphoric mood. Negative for agitation, behavioral problems, self-injury, sleep disturbance and suicidal ideas. The patient is nervous/anxious.   All other systems reviewed and are negative.   Per HPI unless specifically indicated above   Allergies as of 07/20/2019      Reactions   Buprenorphine Hcl Itching   Cymbalta [duloxetine Hcl] Other (See Comments)   Other reaction(s): Other (See Comments)  Personality changes - crying  2014   Morphine And Related Itching   Oxycodone-acetaminophen Other (See Comments)   Personality changes    Valium [diazepam] Other (See Comments)   Personality changes - "in another world" ;  Hallucinations  2015   Statins Other (See Comments)   Other reaction(s): Other (See Comments) Muscle weakness Muscle weakness   Altace [ramipril] Other (See Comments)   unknown   Floxin [ofloxacin] Other (See Comments)   unknown   Hydromorphone Other (See Comments)   Other reaction(s): Confusion (intolerance) unknown   Lovaza [omega-3-acid Ethyl Esters] Other (See Comments)   Muscle weakness    Trilipix [choline Fenofibrate] Other (See Comments)     Muscle weakness    Zetia [ezetimibe] Other (See Comments)   Muscle weakness       Medication List       Accurate as of July 20, 2019 12:06 PM. If you have any questions, ask your nurse or doctor.        STOP taking these medications   LORazepam 0.5 MG tablet Commonly known as: ATIVAN Stopped by: Fransisca Kaufmann Shatasha Lambing, MD   oxyCODONE 5 MG immediate release tablet Commonly known as: Oxy IR/ROXICODONE Stopped by: Worthy Rancher, MD   oxyCODONE-acetaminophen 5-325 MG tablet Commonly known as: PERCOCET/ROXICET Stopped by: Worthy Rancher, MD   predniSONE 20 MG tablet Commonly known as: DELTASONE Stopped by: Worthy Rancher, MD   traZODone 50 MG tablet Commonly known as: [place] by: Worthy Rancher, MD     TAKE these medications   aspirin EC 81 MG tablet Take 1 tablet (81 mg total) by mouth daily.   CALCIUM + D PO Take 1 tablet by mouth daily.   clobetasol cream 0.05 % Commonly known as: TEMOVATE Apply 1 application topically See admin instructions. Applies to vaginal area twice a week.   diltiazem 120 MG 24 hr capsule Commonly known as: CARDIZEM CD Take 1 capsule (120 mg total) by mouth 2 (two) times daily.   diphenoxylate-atropine 2.5-0.025 MG tablet Commonly known as: LOMOTIL Take 1 tablet by mouth 4 (four) times daily as needed for diarrhea or loose stools.   Eliquis 5 MG Tabs tablet Generic drug: apixaban TAKE 1 TABLET TWICE A DAY   Eszopiclone 3 MG Tabs Take 1 tablet (3 mg total) by mouth at bedtime. For insomnia,take immediately before bedtime.   fentaNYL 50 MCG/HR Commonly known as: DURAGESIC Place 1 patch (50 mcg total) onto the skin every 3 (three) days.   FLUoxetine 40 MG capsule Commonly known as: PROZAC Take 1 capsule (40 mg total) by mouth daily.   furosemide 80 MG tablet Commonly known as: LASIX Take 1 tablet (80 mg total) by mouth daily.   HYDROcodone-acetaminophen 10-325 MG tablet Commonly known as: NORCO Take 1  tablet by mouth every 4 (four) hours as needed for moderate pain.   ketoconazole 2 % cream Commonly known as: NIZORAL Apply 1 application topically daily. Apply to both feet and between toes once daily for 6 weeks   methocarbamol 500 MG tablet Commonly known as: ROBAXIN Take 500 mg by mouth 4 (four) times daily as needed for muscle spasms.   multivitamin tablet Take 1 tablet by mouth daily. For supplement   nitroGLYCERIN 0.4 MG SL tablet Commonly known as: Nitrostat Place 1 tablet (0.4 mg total) under the tongue every 5 (five) minutes as needed for chest pain (up to 3 doses).   pantoprazole 40 MG tablet Commonly known as: PROTONIX Take 1 tablet (  40 mg total) by mouth 2 (two) times daily.   potassium chloride SA 20 MEQ tablet Commonly known as: K-DUR Take 1 tablet (20 mEq total) by mouth daily.   rosuvastatin 10 MG tablet Commonly known as: CRESTOR TAKE 1 TABLET AS DIRECTED   Vitamin D 50 MCG (2000 UT) tablet Take 2,000 Units by mouth daily.        Objective:   BP 100/66    Pulse 87    Temp (!) 97.5 F (36.4 C) (Temporal)    Ht _0  (1.702 m)    Wt 191 lb 3.2 oz (86.7 kg)    BMI 29.95 kg/m   Wt Readings from Last 3 Encounters:  07/20/19 191 lb 3.2 oz (86.7 kg)  05/02/19 195 lb (88.5 kg)  03/08/19 190 lb (86.2 kg)    Physical Exam Vitals signs and nursing note reviewed.  Constitutional:      General: She is not in acute distress.    Appearance: She is well-developed. She is not diaphoretic.  Eyes:     Conjunctiva/sclera: Conjunctivae normal.  Cardiovascular:     Rate and Rhythm: Normal rate. Rhythm irregular.     Heart sounds: Normal heart sounds. No murmur.  Pulmonary:     Effort: Pulmonary effort is normal. No respiratory distress.     Breath sounds: Normal breath sounds. No wheezing.  Musculoskeletal: Normal range of motion.        General: Tenderness (Anterior left hip tenderness, also lateral and posterior left hip tenderness, worse with external and  internal rotation.) present. No swelling.  Skin:    General: Skin is warm and dry.     Findings: No rash.  Neurological:     Mental Status: She is alert and oriented to person, place, and time.     Coordination: Coordination normal.  Psychiatric:        Behavior: Behavior normal.     Assessment & Plan:   Problem List Items Addressed This Visit      Cardiovascular and Mediastinum   Essential hypertension   Relevant Medications   diltiazem (CARDIZEM CD) 120 MG 24 hr capsule   ELIQUIS 5 MG TABS tablet   furosemide (LASIX) 80 MG tablet   rosuvastatin (CRESTOR) 10 MG tablet   Other Relevant Orders   CMP14+EGFR (Completed)   TSH (Completed)   Permanent atrial fibrillation   Relevant Medications   pantoprazole (PROTONIX) 40 MG tablet   diltiazem (CARDIZEM CD) 120 MG 24 hr capsule   ELIQUIS 5 MG TABS tablet   furosemide (LASIX) 80 MG tablet   rosuvastatin (CRESTOR) 10 MG tablet   Other Relevant Orders   TSH (Completed)     Digestive   GERD   Relevant Medications   pantoprazole (PROTONIX) 40 MG tablet   Other Relevant Orders   Ambulatory referral to Sleep Studies   TSH (Completed)     Other   Hyperlipidemia   Relevant Medications   diltiazem (CARDIZEM CD) 120 MG 24 hr capsule   ELIQUIS 5 MG TABS tablet   furosemide (LASIX) 80 MG tablet   rosuvastatin (CRESTOR) 10 MG tablet   Other Relevant Orders   Lipid panel (Completed)   Depression - Primary   Relevant Medications   FLUoxetine (PROZAC) 40 MG capsule   buPROPion (WELLBUTRIN XL) 150 MG 24 hr tablet   Other Relevant Orders   CMP14+EGFR (Completed)   Anxiety   Relevant Medications   FLUoxetine (PROZAC) 40 MG capsule   buPROPion (WELLBUTRIN XL) 150 MG 24  hr tablet    Other Visit Diagnoses    Paroxysmal atrial fibrillation (HCC)       Relevant Medications   pantoprazole (PROTONIX) 40 MG tablet   diltiazem (CARDIZEM CD) 120 MG 24 hr capsule   ELIQUIS 5 MG TABS tablet   furosemide (LASIX) 80 MG tablet    rosuvastatin (CRESTOR) 10 MG tablet   Other Relevant Orders   CBC with Differential/Platelet (Completed)   Atherosclerosis of native coronary artery of native heart without angina pectoris       Relevant Medications   pantoprazole (PROTONIX) 40 MG tablet   Chronic atrial fibrillation       Relevant Medications   diltiazem (CARDIZEM CD) 120 MG 24 hr capsule   ELIQUIS 5 MG TABS tablet   furosemide (LASIX) 80 MG tablet   rosuvastatin (CRESTOR) 10 MG tablet   Other Relevant Orders   CBC with Differential/Platelet (Completed)   Chronic diastolic heart failure (HCC)       Relevant Medications   diltiazem (CARDIZEM CD) 120 MG 24 hr capsule   ELIQUIS 5 MG TABS tablet   furosemide (LASIX) 80 MG tablet   rosuvastatin (CRESTOR) 10 MG tablet   Other Relevant Orders   CMP14+EGFR (Completed)   OSA (obstructive sleep apnea)       Relevant Orders   Ambulatory referral to Sleep Studies   CBC with Differential/Platelet (Completed)   Left hip pain       Relevant Orders   DG HIP UNILAT W OR W/O PELVIS 2-3 VIEWS LEFT      1 to get a left hip x-ray but they will come back for this later, or if they cannot come back later they will call the orthopedic and go see them for the hip.  Likely osteoarthritis based on symptoms  Patient has some snoring and sleep apnea symptoms and will refer for sleep studies  Continue current medication for diltiazem and Eliquis and Lasix and Crestor for her heart and congestive failure and cholesterol and A. fib. Follow up plan: Return in about 4 weeks (around 08/17/2019), or if symptoms worsen or fail to improve, for Depression recheck.  Counseling provided for all of the vaccine components No orders of the defined types were placed in this encounter.   Caryl Pina, MD [facility] 07/20/2019, 12:06 PM

## 2019-07-20 NOTE — Telephone Encounter (Signed)
R/s apt per 7/30 sch message - pt daughter is aware of appt date and time.

## 2019-07-21 LAB — LIPID PANEL
Chol/HDL Ratio: 2.3 ratio (ref 0.0–4.4)
Cholesterol, Total: 138 mg/dL (ref 100–199)
HDL: 61 mg/dL (ref >39)
LDL Calculated: 52 mg/dL (ref 0–99)
Triglycerides: 123 mg/dL (ref 0–149)
VLDL Cholesterol Cal: 25 mg/dL (ref 5–40)

## 2019-07-21 LAB — CMP14+EGFR
ALT: 20 IU/L (ref 0–32)
AST: 23 IU/L (ref 0–40)
Albumin/Globulin Ratio: 1.8 (ref 1.2–2.2)
Albumin: 4 g/dL (ref 3.7–4.7)
Alkaline Phosphatase: 115 IU/L (ref 39–117)
BUN/Creatinine Ratio: 14 (ref 12–28)
BUN: 10 mg/dL (ref 8–27)
Bilirubin Total: 0.4 mg/dL (ref 0.0–1.2)
CO2: 26 mmol/L (ref 20–29)
Calcium: 8.9 mg/dL (ref 8.7–10.3)
Chloride: 100 mmol/L (ref 96–106)
Creatinine, Ser: 0.73 mg/dL (ref 0.57–1.00)
GFR calc Af Amer: 91 mL/min/{1.73_m2} (ref >59)
GFR calc non Af Amer: 79 mL/min/{1.73_m2} (ref >59)
Globulin, Total: 2.2 g/dL (ref 1.5–4.5)
Glucose: 107 mg/dL — ABNORMAL HIGH (ref 65–99)
Potassium: 4 mmol/L (ref 3.5–5.2)
Sodium: 142 mmol/L (ref 134–144)
Total Protein: 6.2 g/dL (ref 6.0–8.5)

## 2019-07-21 LAB — CBC WITH DIFFERENTIAL/PLATELET
Basophils Absolute: 0 10*3/uL (ref 0.0–0.2)
Basos: 0 %
EOS (ABSOLUTE): 0.1 10*3/uL (ref 0.0–0.4)
Eos: 2 %
Hematocrit: 35.6 % (ref 34.0–46.6)
Hemoglobin: 11.7 g/dL (ref 11.1–15.9)
Immature Grans (Abs): 0 10*3/uL (ref 0.0–0.1)
Immature Granulocytes: 0 %
Lymphocytes Absolute: 1.9 10*3/uL (ref 0.7–3.1)
Lymphs: 34 %
MCH: 30.1 pg (ref 26.6–33.0)
MCHC: 32.9 g/dL (ref 31.5–35.7)
MCV: 92 fL (ref 79–97)
Monocytes Absolute: 0.5 10*3/uL (ref 0.1–0.9)
Monocytes: 8 %
Neutrophils Absolute: 3 10*3/uL (ref 1.4–7.0)
Neutrophils: 56 %
Platelets: 206 10*3/uL (ref 150–450)
RBC: 3.89 x10E6/uL (ref 3.77–5.28)
RDW: 12.7 % (ref 11.7–15.4)
WBC: 5.5 10*3/uL (ref 3.4–10.8)

## 2019-07-21 LAB — TSH: TSH: 3.03 u[IU]/mL (ref 0.450–4.500)

## 2019-07-24 ENCOUNTER — Other Ambulatory Visit: Payer: Medicare HMO

## 2019-07-24 ENCOUNTER — Ambulatory Visit: Payer: Medicare HMO | Admitting: Hematology

## 2019-07-25 LAB — SPECIMEN STATUS REPORT

## 2019-07-25 LAB — HGB A1C W/O EAG: Hgb A1c MFr Bld: 6.1 % — ABNORMAL HIGH (ref 4.8–5.6)

## 2019-09-05 DIAGNOSIS — M79605 Pain in left leg: Secondary | ICD-10-CM | POA: Diagnosis not present

## 2019-09-06 ENCOUNTER — Other Ambulatory Visit: Payer: Self-pay

## 2019-09-07 ENCOUNTER — Ambulatory Visit: Payer: Medicare HMO | Admitting: Family Medicine

## 2019-09-11 ENCOUNTER — Ambulatory Visit: Payer: Medicare HMO | Admitting: Podiatry

## 2019-09-18 ENCOUNTER — Ambulatory Visit: Payer: Medicare HMO | Admitting: Podiatry

## 2019-09-18 ENCOUNTER — Encounter: Payer: Self-pay | Admitting: Podiatry

## 2019-09-18 ENCOUNTER — Other Ambulatory Visit: Payer: Self-pay

## 2019-09-18 DIAGNOSIS — B351 Tinea unguium: Secondary | ICD-10-CM | POA: Diagnosis not present

## 2019-09-18 DIAGNOSIS — M79675 Pain in left toe(s): Secondary | ICD-10-CM | POA: Diagnosis not present

## 2019-09-18 DIAGNOSIS — Z9229 Personal history of other drug therapy: Secondary | ICD-10-CM | POA: Diagnosis not present

## 2019-09-18 DIAGNOSIS — Q828 Other specified congenital malformations of skin: Secondary | ICD-10-CM

## 2019-09-18 DIAGNOSIS — M79674 Pain in right toe(s): Secondary | ICD-10-CM

## 2019-09-18 NOTE — Patient Instructions (Signed)

## 2019-09-18 NOTE — Progress Notes (Signed)
Subjective: She c/o painful callus plantar aspect of right foot as well as painful right hallux where she had matrixectomy performed. She grows a residual spicule which grabs onto her socks and causes pain.   [name] [name] is seen today for follow up painful, elongated, thickened toenails b/l feet that she cannot cut. Pain interferes with daily activities. Aggravating factor includes wearing enclosed shoe gear and relieved with periodic debridement.  Her daughter is present during the visit. They are requesting follow up visit sooner than 3 months.   Patient remains on blood thinner Eliquis.  Current Outpatient Medications on File Prior to Visit  Medication Sig  . aspirin EC 81 MG tablet Take 1 tablet (81 mg total) by mouth daily.  Marland Kitchen buPROPion (WELLBUTRIN XL) 150 MG 24 hr tablet Take 1 tablet (150 mg total) by mouth daily.  . Calcium Citrate-Vitamin D (CALCIUM + D PO) Take 1 tablet by mouth daily.  . Cholecalciferol (VITAMIN D) 2000 units tablet Take 2,000 Units by mouth daily.  . clobetasol cream (TEMOVATE) [id number] % Apply 1 application topically See admin instructions. Applies to vaginal area twice a week.  . diltiazem (CARDIZEM CD) 120 MG 24 hr capsule Take 1 capsule (120 mg total) by mouth 2 (two) times daily.  . diphenoxylate-atropine (LOMOTIL) 2.5-0.025 MG per tablet Take 1 tablet by mouth 4 (four) times daily as needed for diarrhea or loose stools.   Marland Kitchen ELIQUIS 5 MG TABS tablet Take 1 tablet (5 mg total) by mouth 2 (two) times daily.  . Eszopiclone 3 MG TABS Take 1 tablet (3 mg total) by mouth at bedtime. For insomnia,take immediately before bedtime.  . fentaNYL (DURAGESIC - DOSED MCG/HR) 50 MCG/HR Place 1 patch (50 mcg total) onto the skin every 3 (three) days.  Marland Kitchen FLUoxetine (PROZAC) 40 MG capsule Take 1 capsule (40 mg total) by mouth daily.  . furosemide (LASIX) 80 MG tablet Take 1 tablet (80 mg total) by mouth daily.  Marland Kitchen HYDROcodone-acetaminophen (NORCO) 10-325 MG per tablet Take 1  tablet by mouth every 4 (four) hours as needed for moderate pain.   Marland Kitchen ketoconazole (NIZORAL) 2 % cream Apply 1 application topically daily. Apply to both feet and between toes once daily for 6 weeks  . methocarbamol (ROBAXIN) 500 MG tablet Take 500 mg by mouth 4 (four) times daily as needed for muscle spasms.  . Multiple Vitamin (MULTIVITAMIN) tablet Take 1 tablet by mouth daily. For supplement  . nitroGLYCERIN (NITROSTAT) 0.4 MG SL tablet Place 1 tablet (0.4 mg total) under the tongue every 5 (five) minutes as needed for chest pain (up to 3 doses).  . pantoprazole (PROTONIX) 40 MG tablet Take 1 tablet (40 mg total) by mouth 2 (two) times daily.  . potassium chloride SA (K-DUR) 20 MEQ tablet Take 1 tablet (20 mEq total) by mouth daily.  . rosuvastatin (CRESTOR) 10 MG tablet TAKE 1 TABLET AS DIRECTED   No current facility-administered medications on file prior to visit.      Allergies  Allergen Reactions  . Buprenorphine Hcl Itching  . Cymbalta [Duloxetine Hcl] Other (See Comments)    Other reaction(s): Other (See Comments) Personality changes - crying  2014  . Morphine And Related Itching  . Oxycodone-Acetaminophen Other (See Comments)    Personality changes   . Valium [Diazepam] Other (See Comments)    Personality changes - "in another world" ;  Hallucinations  2015  . Statins Other (See Comments)    Other reaction(s): Other (See Comments) Muscle weakness  Muscle weakness  . Altace [Ramipril] Other (See Comments)    unknown  . Floxin [Ofloxacin] Other (See Comments)    unknown  . Hydromorphone Other (See Comments)    Other reaction(s): Confusion (intolerance) unknown  . Lovaza [Omega-3-Acid Ethyl Esters] Other (See Comments)    Muscle weakness   . Trilipix [Choline Fenofibrate] Other (See Comments)    Muscle weakness   . Zetia [Ezetimibe] Other (See Comments)    Muscle weakness    Objective:  Vascular Examination: Capillary refill time immediate x 10 digits.  Dorsalis  pedis present b/l.  Posterior tibial pulses faintly palpable b/l.  Digital hair  present x 10 digits.  Skin temperature gradient WNL b/l.   Dermatological Examination: Skin with normal turgor, texture and tone b/l.  Toenails 2-5 b/l discolored, thick, dystrophic with subungual debris and pain with palpation to nailbeds due to thickness of nails. Evidence of b/l total nail avulsions with recurrent spicules noted lateral border right hallux and medial border left hallux. No erythema, no edema, no drainage, no flocculence.  Porokeratotic lesion submet head 5 right foot with tenderness to palpation. No erythema, no edema, no drainage, no flocculence.  Musculoskeletal: Muscle strength 5/5 to all LE muscle groups  Hammerteos 2nd digit right foot. Ambulates with walker.  No pain, crepitus or joint limitation noted with ROM.   Neurological Examination: Protective sensation intact with 10 gram monofilament bilaterally.  Vibratory sensation diminished b/l.  Assessment: Painful onychomycosis toenails 2-5 b/l; retained nail spicules b/l hallux Painful callus submet head 5 right foot Patient on long term blood thinner Neuropathy  Plan: 1. Toenails 2-5 b/l were debrided in length and girth without iatrogenic bleeding. Gently debrided retained nail spicules b/l great toes. 2. Patient to continue soft, supportive shoe gear 3. Patient to report any pedal injuries to medical professional immediately. 4. Follow up 9 weeks. 5. Patient/POA to call should there be a concern in the interim.

## 2019-09-20 DIAGNOSIS — Z01419 Encounter for gynecological examination (general) (routine) without abnormal findings: Secondary | ICD-10-CM | POA: Diagnosis not present

## 2019-09-20 DIAGNOSIS — L900 Lichen sclerosus et atrophicus: Secondary | ICD-10-CM | POA: Diagnosis not present

## 2019-09-21 DIAGNOSIS — M79644 Pain in right finger(s): Secondary | ICD-10-CM | POA: Diagnosis not present

## 2019-09-21 DIAGNOSIS — Z5181 Encounter for therapeutic drug level monitoring: Secondary | ICD-10-CM | POA: Diagnosis not present

## 2019-09-21 DIAGNOSIS — Z79899 Other long term (current) drug therapy: Secondary | ICD-10-CM | POA: Diagnosis not present

## 2019-09-27 ENCOUNTER — Ambulatory Visit: Payer: Medicare HMO | Admitting: Physician Assistant

## 2019-10-02 ENCOUNTER — Ambulatory Visit: Payer: Medicare HMO | Admitting: Physician Assistant

## 2019-10-02 ENCOUNTER — Encounter: Payer: Self-pay | Admitting: Physician Assistant

## 2019-10-02 ENCOUNTER — Other Ambulatory Visit: Payer: Self-pay

## 2019-10-02 VITALS — BP 120/64 | HR 71 | Ht 67.0 in | Wt 187.0 lb

## 2019-10-02 DIAGNOSIS — E782 Mixed hyperlipidemia: Secondary | ICD-10-CM | POA: Diagnosis not present

## 2019-10-02 DIAGNOSIS — I4821 Permanent atrial fibrillation: Secondary | ICD-10-CM

## 2019-10-02 DIAGNOSIS — I25119 Atherosclerotic heart disease of native coronary artery with unspecified angina pectoris: Secondary | ICD-10-CM

## 2019-10-02 DIAGNOSIS — I5032 Chronic diastolic (congestive) heart failure: Secondary | ICD-10-CM | POA: Diagnosis not present

## 2019-10-02 MED ORDER — NITROGLYCERIN 0.4 MG SL SUBL: 0.4000 mg | SUBLINGUAL_TABLET | SUBLINGUAL | 3 refills | Status: DC | PRN

## 2019-10-02 NOTE — Progress Notes (Signed)
Cardiology Office Note:    Date:  10/02/2019   ID:  [name] [name], DOB 06-08-1941, MRN [id number]  PCP:  Dettinger, [name] Kaufmann, MD  Cardiologist:  [name] Mocha, MD   Electrophysiologist:  None   Referring MD: Dettinger, [name] Kaufmann, MD   Chief Complaint  Patient presents with   Follow-up    CAD, CHF, atrial fibrillation     History of Present Illness:    [name] [name] is a 78 y.o. female with:   Coronary artery disease  Cath 12/15: Complex bifurcational distal LM/ostial LCx disease - poor CABG candidate  PCI 12/15: DES to distal LM and ostial LCx  Myoview 11/17: + Inferior ischemia  Cath 12/17: Patent LM/LCx stent; no significant RCA disease  Diastolic CHF  Permanent atrial fibrillation  S/p DCCV 1/17 >> ERAF  >> rate control strategy  Hypertension  Hyperlipidemia  S/p multiple back surgeries; managed with chronic narcotic pain medication  Hx of orthostatic hypotension; s/p multiple falls in the past  Hepatic steatosis (elevated LFTs)  MGUS  [name] [name] was last seen by Dr. Burt [name] in December 2019.  She returns for follow-up.  She is here with her daughter.  She has overall been stable.  She has not had chest pain.  Her breathing is about the same.  She has chronic fatigue.  She has not had significant leg swelling.  She has not had melena or hematochezia.  She was getting out of the car a few months ago and had a muscle injury.  It sounds like she had a large hematoma that was slow to resolve.  Prior CV studies:   The following studies were reviewed today:  Echocardiogram 11/24/2018 EF 50-55, normal wall motion, trivial AI, trivial MR, severe LAE, moderate RAE, mild TR, PASP 35  ABIs 01/18/2018 Final Interpretation: Right: Resting right ankle-brachial index is within normal range. No evidence of significant right lower extremity arterial disease. The right toe-brachial index is normal.  Left: Resting left ankle-brachial index is within normal range. No  evidence of significant left lower extremity arterial disease. The left toe-brachial index is normal.  Echocardiogram 12/22/2017 EF 55-60, normal wall motion, mild MAC, trivial MR, severe LAE, normal RV SF, mild TR, PASP 31  UE Vascular US 12/17 Patent right radial artery and veins, without evidence of fistula or pseudoaneurysm. Probable hematoma anterior to the distal right radial artery.  LHC 11/25/16 LM stent patent (stent extends from LM into pLAD) LAD irregs LCx stent patent with 25 RCA mid 40  Myoview 11/19/16 EF 48, poor quality/significant artifact; inf-lateral, inferior ischemia; Intermediate Risk  Holter 09/10/16 The basic rhythm is atrial fibrillation with average HR 97 bpm There are occasional ventricular ectopics No other arrhythmia identified No bradycardic events  Holter 3/17 The baseline rhythm is atrial fibrillation The average ventricular rate is 96 bpm There are periods of rapid atrial fib and slow atrial fib with pauses up to 3 seconds There are single PVC's or aberrated beats with a rare ventricular couplet  Echo 01/29/16 EF 50-55%, trivial AI, mild MR, mod LAE, PASP 37 mmHg  LHC 1/16 LM dist ? 50% LAD prox 30% - FFR 0.82 LCx ostial 70% - FFR 0.86 >> 0.66 RCA mid 40% CABG deferred b/c of poor functional capacity [id number] bifurcational distal LM and ostial LCx with rotational atherectomy and IVUS guided with 2.75 x 12 mm Synergy DES to ostial LCx;4 x 16 mm Synergy DES to distal LM  Myoview 12/15 Intermediate risk stress nuclear study Moderate  area of anteroapical and anteroseptal ischemia Also inferolateral wall infarct with moderate peri infarct ischemiaStudy suggests multi vessel diseae.LV Ejection Fraction: 57%  Echo 7/11 Mild LVH, EF 55-60%, no RWMA, mild MR  Past Medical History:  Diagnosis Date   Abnormal nuclear cardiac imaging test    Anemia    Anemia, iron deficiency 07/02/2015   Anxiety    Arthritis    "knees,  back, fingers, toes; joints" (01/07/2015)   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 Abnl nuc 11/2014. Cath 12/2014 - turned down for CABG. Ultimately s/p TTVP, rotational atherectomy, PTCA and stenting of the ostial LCx and left main into the LAD (crush technique), and IVUS of the LAD/Left main. // b. Myoview 11/17: EF 48, poor quality/significant artifact; inf-lateral, inferior ischemia; Intermediate Risk   Chronic atrial fibrillation ([place])    a.  First noted post-op 9/15 spinal fusion. She had cardioversion, not on anticoagulation. Fall risk, unsteady. // failed DCCV // Holter 10/17: AFib, Avg HR 97, PVCs, no other arrhythmia   Chronic back pain greater than 3 months duration    a. spinal stenosis. Spinal fusion with rods in 2/15 at [facility] spinal fusion 9/15.   Chronic diastolic CHF (congestive heart failure) (HCC)    Circadian rhythm sleep disorder    CTS (carpal tunnel syndrome)    Deficiency anemia 11/10/2014   Depression    Diarrhea    Diverticulitis of colon    Esophageal stricture    Gastritis    Gastroesophageal hernia 07/06/2013   GERD (gastroesophageal reflux disease)    Hiatal hernia    History of blood transfusion    "most of them related to OR's"    History of echocardiogram    a. Echo 2/17:  EF 50-55%, trivial AI, midl MR, mod LAE, PASP 37 mmHg   HTN (hypertension)    Hyperlipidemia    IBS (irritable bowel syndrome)    Incontinence 10/13/2012   Insomnia    Ischemic chest pain ([place])    Memory disorder 35/00/9381   Metabolic syndrome 07/22/9936   MGUS (monoclonal gammopathy of unknown significance) dx'd 11/2014   a. Neg BMB 11/2014.   Multiple falls    Obesity    Obstructive sleep apnea    "have mask; don't wear it" (01/07/2015)   Orthostasis    PAT (paroxysmal atrial tachycardia) ([place])    Personal history of colonic polyps 10/25/2011 & 12/02/11   not retrieved Dr [name] [name] & tubular adenomas     Pneumonia 03/2014   Sinus bradycardia    a. Baseline HR 50s-60s.   Stroke [facility]) early 2000's   "small"; denies residual on 01/07/2015)   Surgical Hx: The patient  has a past surgical history that includes Carpal tunnel release (Right, 1980's); Total knee arthroplasty (Bilateral, 1990's - 2000's); Cataract extraction (Bilateral); left heart catheterization with coronary angiogram (N/A, 12/27/2014); Percutaneous coronary rotoblator intervention (pci-r) (01/07/2015); Cardiac catheterization (12/27/2014); Coronary angioplasty with stent (01/07/2015); Laparoscopic cholecystectomy (2003); Joint replacement; Knee arthroscopy (Left, 1995); Back surgery; Posterior lumbar fusion (01/2015); Posterior fusion thoracic spine (08/2015); Dilation and curettage of uterus; Cataract extraction w/ intraocular lens  implant, bilateral (2000's); Esophagogastroduodenoscopy (egd) with esophageal dilation ("several times"); Bone marrow biopsy (11/2014); percutaneous coronary rotoblator intervention (pci-r) (N/A, 01/07/2015); Coronary stent placement; Cardioversion (N/A, 02/13/2016); and Cardiac catheterization (N/A, 11/25/2016).   Current Medications: Current Meds  Medication Sig   aspirin EC 81 MG tablet Take 1 tablet (81 mg total) by mouth daily.   Calcium  Citrate-Vitamin D (CALCIUM + D PO) Take 1 tablet by mouth daily.   Cholecalciferol (VITAMIN D) 2000 units tablet Take 2,000 Units by mouth daily.   clobetasol cream (TEMOVATE) 7.32 % Apply 1 application topically See admin instructions. Applies to vaginal area twice a week.   diltiazem (CARDIZEM CD) 120 MG 24 hr capsule Take 1 capsule (120 mg total) by mouth 2 (two) times daily.   diphenoxylate-atropine (LOMOTIL) 2.5-0.025 MG per tablet Take 1 tablet by mouth 4 (four) times daily as needed for diarrhea or loose stools.    ELIQUIS 5 MG TABS tablet Take 1 tablet (5 mg total) by mouth 2 (two) times daily.   Eszopiclone 3 MG TABS Take 1 tablet (3 mg total) by mouth at  bedtime. For insomnia,take immediately before bedtime.   fentaNYL (DURAGESIC - DOSED MCG/HR) 50 MCG/HR Place 1 patch (50 mcg total) onto the skin every 3 (three) days.   FLUoxetine (PROZAC) 40 MG capsule Take 1 capsule (40 mg total) by mouth daily.   furosemide (LASIX) 80 MG tablet Take 1 tablet (80 mg total) by mouth daily.   HYDROcodone-acetaminophen (NORCO) 10-325 MG per tablet Take 1 tablet by mouth every 4 (four) hours as needed for moderate pain.    LORazepam (ATIVAN) 0.5 MG tablet Takes as needed   methocarbamol (ROBAXIN) 500 MG tablet Take 500 mg by mouth 4 (four) times daily as needed for muscle spasms.   Multiple Vitamin (MULTIVITAMIN) tablet Take 1 tablet by mouth daily. For supplement   nitroGLYCERIN (NITROSTAT) 0.4 MG SL tablet Place 1 tablet (0.4 mg total) under the tongue every 5 (five) minutes as needed for chest pain (up to 3 doses).   pantoprazole (PROTONIX) 40 MG tablet Take 1 tablet (40 mg total) by mouth 2 (two) times daily.   potassium chloride SA (K-DUR) 20 MEQ tablet Take 1 tablet (20 mEq total) by mouth daily.   rosuvastatin (CRESTOR) 10 MG tablet Takes 10 mg every other day alternating with 5 mg every other day.   [DISCONTINUED] nitroGLYCERIN (NITROSTAT) 0.4 MG SL tablet Place 1 tablet (0.4 mg total) under the tongue every 5 (five) minutes as needed for chest pain (up to 3 doses).     Allergies:   Buprenorphine hcl, Cymbalta [duloxetine hcl], Morphine and related, Oxycodone-acetaminophen, Valium [diazepam], Statins, Altace [ramipril], Floxin [ofloxacin], Hydromorphone, Lovaza [omega-3-acid ethyl esters], Trilipix [choline fenofibrate], and Zetia [ezetimibe]   Social History   Tobacco Use   Smoking status: Never Smoker   Smokeless tobacco: Never Used  Substance Use Topics   Alcohol use: No    Comment: 01/07/2015 "glass of wine at Christmas, maybe"   Drug use: No     Family Hx: The patient's family history includes Colon cancer in her sister and  sister; Coronary artery disease in her brother, father, and mother; Emphysema in her brother and sister; Peripheral vascular disease in her father; Sleep apnea in her [name]. There is no history of Dementia.  ROS:   Please see the history of present illness.    ROS All other systems reviewed and are negative.   EKGs/Labs/Other Test Reviewed:    EKG:  EKG is  ordered today.  The ekg ordered today demonstrates atrial fibrillation, HR 105, normal axis, nonspecific ST-T wave changes, QTC 467, no significant change when compared to prior tracings  Recent Labs: 07/20/2019: ALT 20; BUN 10; Creatinine, Ser 0.73; Hemoglobin 11.7; Platelets 206; Potassium 4.0; Sodium 142; TSH 3.030   Recent Lipid Panel Lab Results  Component Value Date/Time  CHOL 138 07/20/2019 12:52 PM   CHOL 156 05/11/2013 03:36 PM   TRIG 123 07/20/2019 12:52 PM   TRIG 121 02/18/2016 03:04 PM   TRIG 239 (H) 05/11/2013 03:36 PM   HDL 61 07/20/2019 12:52 PM   HDL 57 02/18/2016 03:04 PM   HDL 43 05/11/2013 03:36 PM   CHOLHDL 2.3 07/20/2019 12:52 PM   LDLCALC 52 07/20/2019 12:52 PM   LDLCALC 50 08/21/2014 11:41 AM   LDLCALC 65 05/11/2013 03:36 PM    Physical Exam:    VS:  BP 120/64    Pulse 71    Ht _0  (1.702 m)    Wt 187 lb (84.8 kg)    SpO2 95%    BMI 29.29 kg/m     Wt Readings from Last 3 Encounters:  10/02/19 187 lb (84.8 kg)  07/20/19 191 lb 3.2 oz (86.7 kg)  05/02/19 195 lb (88.5 kg)     Physical Exam  Constitutional: She is oriented to person, place, and time. She appears well-developed and well-nourished. No distress.  HENT:  Head: Normocephalic and atraumatic.  Eyes: No scleral icterus.  Neck: No JVD present. No thyromegaly present.  Cardiovascular: Normal rate and normal heart sounds. An irregularly irregular rhythm present.  No murmur heard. Pulmonary/Chest: Effort normal and breath sounds normal. She has no rales.  Abdominal: Soft. There is no hepatomegaly.  Musculoskeletal:        General: No  edema.  Lymphadenopathy:    She has no cervical adenopathy.  Neurological: She is alert and oriented to person, place, and time.  Skin: Skin is warm and dry.  Psychiatric: She has a normal mood and affect.    ASSESSMENT & PLAN:    1. Coronary artery disease involving native coronary artery of native heart with angina pectoris [facility]) History of bifurcational PCI with DES to the distal left main and ostial LCx in January 2016.  Cardiac catheterization 2017 demonstrated patent stent and no significant disease elsewhere.  She is doing well without anginal symptoms.  Continue aspirin, statin therapy.  2. Chronic diastolic (congestive) heart failure (HCC) Volume status overall stable.  She is mainly limited by chronic pain from multiple back surgeries and deconditioning.  Continue current dose of diuretic.  3. Permanent atrial fibrillation ([place]) Fair control of heart rate.  She is tolerating anticoagulation.  Recent hemoglobin, creatinine normal.  Continue current dose of Apixaban.  4. Mixed hyperlipidemia LDL optimal on most recent lab work.  Continue current Rx.     Dispo:  Return in about 6 months (around 04/01/2020) for Routine Follow Up, w/ Dr. Burt [name], or Richardson Dopp, PA-C, (virtual or in-person).   Medication Adjustments/Labs and Tests Ordered: Current medicines are reviewed at length with the patient today.  Concerns regarding medicines are outlined above.  Tests Ordered: Orders Placed This Encounter  Procedures   EKG 12-Lead   Medication Changes: Meds ordered this encounter  Medications   nitroGLYCERIN (NITROSTAT) 0.4 MG SL tablet    Sig: Place 1 tablet (0.4 mg total) under the tongue every 5 (five) minutes as needed for chest pain (up to 3 doses).    Dispense:  25 tablet    Refill:  3    Signed, Richardson Dopp, PA-C  10/02/2019 4:39 PM    [facility] [address], [place], [place]  [address] Phone: [phone]; Fax: [phone]

## 2019-10-02 NOTE — Patient Instructions (Signed)
Medication Instructions:   Your physician recommends that you continue on your current medications as directed. Please refer to the Current Medication list given to you today.  If you need a refill on your cardiac medications before your next appointment, please call your pharmacy.   Lab work:  None ordered today  Testing/Procedures:  None ordered today  Follow-Up: At [facility], you and your health needs are our priority.  As part of our continuing mission to provide you with exceptional heart care, we have created designated Provider Care Teams.  These Care Teams include your primary Cardiologist (physician) and Advanced Practice Providers (APPs -  Physician Assistants and Nurse Practitioners) who all work together to provide you with the care you need, when you need it. You will need a follow up appointment in:  6 months.  Please call our office 2 months in advance to schedule this appointment.  You may see Sherren Mocha, MD or one of the following Advanced Practice Providers on your designated Care Team: Richardson Dopp, PA-C [place], [place] . Daune Perch, NP

## 2019-10-03 ENCOUNTER — Other Ambulatory Visit: Payer: Self-pay

## 2019-10-04 ENCOUNTER — Ambulatory Visit (INDEPENDENT_AMBULATORY_CARE_PROVIDER_SITE_OTHER): Payer: Medicare HMO

## 2019-10-04 DIAGNOSIS — Z23 Encounter for immunization: Secondary | ICD-10-CM

## 2019-10-24 ENCOUNTER — Other Ambulatory Visit: Payer: Self-pay | Admitting: Nurse Practitioner

## 2019-10-25 ENCOUNTER — Ambulatory Visit: Payer: Medicare HMO | Admitting: Family Medicine

## 2019-10-31 ENCOUNTER — Institutional Professional Consult (permissible substitution): Payer: Medicare HMO | Admitting: Pulmonary Disease

## 2019-11-15 DIAGNOSIS — L900 Lichen sclerosus et atrophicus: Secondary | ICD-10-CM | POA: Diagnosis not present

## 2019-11-23 NOTE — Progress Notes (Signed)
[place]   Telephone:([phone] Fax:([phone]) [phone]   Clinic Follow up Note   Patient Care Team: Dettinger, Fransisca Kaufmann, MD as PCP - General (Family Medicine) Sherren Mocha, MD as PCP - Cardiology (Cardiology) Suella Broad, MD (Physical Medicine and Rehabilitation) Hillary Bow, MD (Cardiology) Newton Pigg, MD (Obstetrics and Gynecology)  Date of Service:  11/24/2019  CHIEF COMPLAINT: Follow up anemia and MGUS    CURRENT THERAPY:  Observation    INTERVAL HISTORY:  [name] [name] is here for a follow up anemia and MGUS. She was last seen by me in 07/2018. She presents to the clinic with her daughter. She notes she is in joint pain all over, weak and loses her balance. She ambulates with walker everywhere and denies a fall. She is fine when she is sitting down. She has been on fentanyl patch and oral hydrocodone and muscle relaxer which she alternates. Dr. Park Liter her orthopedist has been managing her pain medications. After her last back surgery she did not walk much and she has not had PT. She lives by herself and has someone to clean for her and get her groceries. She notes she wants to get up and go but feels like she can not either physically or mentally. Her daughter feels she is depressed. Her daughter feels her memory is stable and she struggles with findings certain word or confuses conversation topics. She also notes LE edema which is controlled with Lasix.     REVIEW OF SYSTEMS:   Constitutional: Denies fevers, chills or abnormal weight loss Eyes: Denies blurriness of vision Ears, nose, mouth, throat, and face: Denies mucositis or sore throat Respiratory: Denies cough, dyspnea or wheezes Cardiovascular: Denies palpitation, chest discomfort (+) lower extremity swelling Gastrointestinal:  Denies nausea, heartburn or change in bowel habits Skin: Denies abnormal skin rashes MSK: (+) Diffuse joint pain   Lymphatics: Denies new lymphadenopathy or easy  bruising Neurological:Denies numbness, tingling or new weaknesses (+) Stable dementia.  Behavioral/Psych: (+) unmotivated and down mood All other systems were reviewed with the patient and are negative.  MEDICAL HISTORY:  Past Medical History:  Diagnosis Date  . Abnormal nuclear cardiac imaging test   . Anemia   . Anemia, iron deficiency 07/02/2015  . Anxiety   . Arthritis    "knees, back, fingers, toes; joints" (01/07/2015)  . 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 CAD (coronary artery disease)    a. Abnl nuc 11/2014. Cath 12/2014 - turned down for CABG. Ultimately s/p TTVP, rotational atherectomy, PTCA and stenting of the ostial LCx and left main into the LAD (crush technique), and IVUS of the LAD/Left main. // b. Myoview 11/17: EF 48, poor quality/significant artifact; inf-lateral, inferior ischemia; Intermediate Risk  . Chronic atrial fibrillation ([place])    a.  First noted post-op 9/15 spinal fusion. She had cardioversion, not on anticoagulation. Fall risk, unsteady. // failed DCCV // Holter 10/17: AFib, Avg HR 97, PVCs, no other arrhythmia  . Chronic back pain greater than 3 months duration    a. spinal stenosis. Spinal fusion with rods in 2/15 at [facility] fusion 9/15.  Marland Kitchen Chronic diastolic CHF (congestive heart failure) ([employer])   . Circadian rhythm sleep disorder   . CTS (carpal tunnel syndrome)   . Deficiency anemia 11/10/2014  . Depression   . Diarrhea   . Diverticulitis of colon   . Esophageal stricture   . Gastritis   . Gastroesophageal hernia 07/06/2013  . GERD (gastroesophageal reflux disease)   .  Hiatal hernia   . History of blood transfusion    "most of them related to OR's"   . History of echocardiogram    a. Echo 2/17:  EF 50-55%, trivial AI, midl MR, mod LAE, PASP 37 mmHg  . HTN (hypertension)   . Hyperlipidemia   . IBS (irritable bowel syndrome)   . Incontinence 10/13/2012  . Insomnia   . Ischemic chest pain ([place])   . Memory disorder 12/04/2014  .  Metabolic syndrome 08/27/2835  . MGUS (monoclonal gammopathy of unknown significance) dx'd 11/2014   a. Neg BMB 11/2014.  . Multiple falls   . Obesity   . Obstructive sleep apnea    "have mask; don't wear it" (01/07/2015)  . Orthostasis   . PAT (paroxysmal atrial tachycardia) ([place])   . Personal history of colonic polyps 10/25/2011 & 12/02/11   not retrieved Dr Lyla Son & tubular adenomas  . Pneumonia 03/2014  . Sinus bradycardia    a. Baseline HR 50s-60s.  . Stroke [facility]) early 2000's   "small"; denies residual on 01/07/2015)    SURGICAL HISTORY: Past Surgical History:  Procedure Laterality Date  . BACK SURGERY    . BONE MARROW BIOPSY  11/2014  . CARDIAC CATHETERIZATION  12/27/2014   Procedure: INTRAVASCULAR PRESSURE WIRE/FFR STUDY;  Surgeon: Larey Dresser, MD;  Location: [facility] CATH LAB;  Service: Cardiovascular;;  . CARDIAC CATHETERIZATION N/A 11/25/2016   Procedure: Left Heart Cath and Coronary Angiography;  Surgeon: Sherren Mocha, MD;  Location: [place] CV LAB;  Service: Cardiovascular;  Laterality: N/A;  . CARDIOVERSION N/A 02/13/2016   Procedure: CARDIOVERSION;  Surgeon: Josue Hector, MD;  Location: [facility] ENDOSCOPY;  Service: Cardiovascular;  Laterality: N/A;  . CARPAL TUNNEL RELEASE Right 1980's  . CATARACT EXTRACTION Bilateral   . CATARACT EXTRACTION W/ INTRAOCULAR LENS  IMPLANT, BILATERAL  2000's  . CORONARY ANGIOPLASTY WITH STENT PLACEMENT  01/07/2015   "2"  . CORONARY STENT PLACEMENT    . DILATION AND CURETTAGE OF UTERUS    . ESOPHAGOGASTRODUODENOSCOPY (EGD) WITH ESOPHAGEAL DILATION  "several times"  . JOINT REPLACEMENT    . KNEE ARTHROSCOPY Left 1995  . LAPAROSCOPIC CHOLECYSTECTOMY  2003  . LEFT HEART CATHETERIZATION WITH CORONARY ANGIOGRAM N/A 12/27/2014   Procedure: LEFT HEART CATHETERIZATION WITH CORONARY ANGIOGRAM;  Surgeon: Larey Dresser, MD;  Location: [facility] CATH LAB;  Service: Cardiovascular;  Laterality: N/A;  . PERCUTANEOUS CORONARY ROTOBLATOR INTERVENTION (PCI-R)   01/07/2015  . PERCUTANEOUS CORONARY ROTOBLATOR INTERVENTION (PCI-R) N/A 01/07/2015   Procedure: PERCUTANEOUS CORONARY ROTOBLATOR INTERVENTION (PCI-R);  Surgeon: Blane Ohara, MD;  Location: [facility] CATH LAB;  Service: Cardiovascular;  Laterality: N/A;  . POSTERIOR FUSION THORACIC SPINE  08/2015  . POSTERIOR LUMBAR FUSION  01/2015  . TOTAL KNEE ARTHROPLASTY Bilateral 1990's - 2000's    I have reviewed the social history and family history with the patient and they are unchanged from previous note.  ALLERGIES:  is allergic to buprenorphine hcl; cymbalta [duloxetine hcl]; morphine and related; oxycodone-acetaminophen; valium [diazepam]; statins; altace [ramipril]; floxin [ofloxacin]; hydromorphone; lovaza [omega-3-acid ethyl esters]; trilipix [choline fenofibrate]; and zetia [ezetimibe].  MEDICATIONS:  Current Outpatient Medications  Medication Sig Dispense Refill  . aspirin EC 81 MG tablet Take 1 tablet (81 mg total) by mouth daily. 90 tablet 3  . Calcium Citrate-Vitamin D (CALCIUM + D PO) Take 1 tablet by mouth daily.    . Cholecalciferol (VITAMIN D) 2000 units tablet Take 2,000 Units by mouth daily.    . clobetasol cream (TEMOVATE)  7.90 % Apply 1 application topically See admin instructions. Applies to vaginal area twice a week.    . diltiazem (CARDIZEM CD) 120 MG 24 hr capsule Take 1 capsule (120 mg total) by mouth 2 (two) times daily. 180 capsule 3  . diphenoxylate-atropine (LOMOTIL) 2.5-0.025 MG per tablet Take 1 tablet by mouth 4 (four) times daily as needed for diarrhea or loose stools.     Marland Kitchen ELIQUIS 5 MG TABS tablet Take 1 tablet (5 mg total) by mouth 2 (two) times daily. 180 tablet 2  . Eszopiclone 3 MG TABS Take 1 tablet (3 mg total) by mouth at bedtime. For insomnia,take immediately before bedtime. 90 tablet 1  . fentaNYL (DURAGESIC - DOSED MCG/HR) 50 MCG/HR Place 1 patch (50 mcg total) onto the skin every 3 (three) days. 10 patch 0  . FLUoxetine (PROZAC) 40 MG capsule Take 1 capsule (40  mg total) by mouth daily. 90 capsule 3  . furosemide (LASIX) 80 MG tablet Take 1 tablet (80 mg total) by mouth daily. 90 tablet 3  . HYDROcodone-acetaminophen (NORCO) 10-325 MG per tablet Take 1 tablet by mouth every 4 (four) hours as needed for moderate pain.     Marland Kitchen LORazepam (ATIVAN) 0.5 MG tablet Takes as needed    . methocarbamol (ROBAXIN) 500 MG tablet Take 500 mg by mouth 4 (four) times daily as needed for muscle spasms.    . Multiple Vitamin (MULTIVITAMIN) tablet Take 1 tablet by mouth daily. For supplement    . nitroGLYCERIN (NITROSTAT) 0.4 MG SL tablet Place 1 tablet (0.4 mg total) under the tongue every 5 (five) minutes as needed for chest pain (up to 3 doses). 25 tablet 3  . pantoprazole (PROTONIX) 40 MG tablet Take 1 tablet (40 mg total) by mouth 2 (two) times daily. 180 tablet 3  . potassium chloride SA (K-DUR) 20 MEQ tablet Take 1 tablet (20 mEq total) by mouth daily. 90 tablet 3  . rosuvastatin (CRESTOR) 10 MG tablet Take 1 tablet (10 mg total) by mouth daily. (Patient taking differently: Take 10 mg by mouth daily. Alternates 10 mg and 5 mg) 90 tablet 0   No current facility-administered medications for this visit.     PHYSICAL EXAMINATION: ECOG PERFORMANCE STATUS: 3 - Symptomatic, >50% confined to bed  Vitals:   11/24/19 1342  BP: 111/68  Pulse: 90  Resp: 18  Temp: 98.3 F (36.8 C)  SpO2: 98%   Filed Weights   11/24/19 1342  Weight: 193 lb 9.6 oz (87.8 kg)   Exam deferred today   LABORATORY DATA:  I have reviewed the data as listed CBC Latest Ref Rng & Units 11/24/2019 07/20/2019 03/08/2019  WBC 4.0 - 10.5 K/uL 4.4 5.5 7.7  Hemoglobin 12.0 - 15.0 g/dL 12.1 11.7 13.8  Hematocrit 36.0 - 46.0 % 38.1 35.6 42.5  Platelets 150 - 400 K/uL 192 206 243     CMP Latest Ref Rng & Units 11/24/2019 07/20/2019 03/08/2019  Glucose 70 - 99 mg/dL 110(H) 107(H) 110(H)  BUN 8 - 23 mg/dL 13 10 14   Creatinine 0.44 - 1.00 mg/dL 0.81 0.73 0.91  Sodium 135 - 145 mmol/L 142 142 142   Potassium 3.5 - 5.1 mmol/L 3.7 4.0 4.3  Chloride 98 - 111 mmol/L 105 100 97  CO2 22 - 32 mmol/L 30 26 29   Calcium 8.9 - 10.3 mg/dL 9.0 8.9 9.7  Total Protein 6.5 - 8.1 g/dL 6.2(L) 6.2 6.6  Total Bilirubin 0.3 - 1.2 mg/dL 0.4 0.4 0.5  Alkaline Phos 38 - 126 U/L 99 115 98  AST 15 - 41 U/L 32 23 28  ALT 0 - 44 U/L 26 20 23       RADIOGRAPHIC STUDIES: I have personally reviewed the radiological images as listed and agreed with the findings in the report. No results found.   ASSESSMENT & PLAN:  [name] [name] is a 78 y.o. female with   1. Anemia of iron deficiency, resolved now  -I previously reviewed her lab result with her and  Her daughter. No lab evidence of megaloblastic anemia, or hemolysis.   -She has normal iron level and ferritin, but low iron saturation and elevated soluble transferrin receptor, and history of very low ferritin in the past (<10), which support IDA. Her bone marrow biopsy also showed low iron staining. -Her anemia remains resolved and has been resolved in recent years. Iron panel is still pending. Continue to monitor. Her current fatigue is not related to anemia.  -I discussed given her resolved anemia and very stable MGUS she can continue management with her PCP and f/u with me as needed in the future. She and her daughter agreed.   2. MGUS, IgG  -SPEP in 2016 showed low level M-protein (0.3), immunofixation was consistent with IgG lambda protein.  Quantitative immunoglobulin level and light chain levels were grossly normal. -Her bone marrow biopsy results, which showed 5% plasma cell, no monoclonal light chain deposition. -She has MGUS. No evidence of multiple myeloma. -Her M-spike has been in range of 0-0.2, very low level. Today's panel is still pending.  -Her risk of MGUS evolving to multiple myeloma is very low  3. Dementia, anxiety -She could not tolerate Aricept. Follow up with neurologist. -Stable memory. She may confuse conversation topics or  difficulty finding the word she wants.   4. HTN, AF, dyslipidemia  -follow up with PCP and cardiologist  5. Fatigue, Diffuse joint pain/arthritis  -She is s/p 2 back surgeries.  -Her diffuse joint pain has worsened  -She is currently on Fentanyl patch and alternating hydrocodone and muscle relaxer's. This is managed by her PCP Dr Warrick Parisian.  -She is not ambulating due to pain exacerbation. She remains sedentary because she has minimal or no pain when sitting.  -She ambulated with walker everywhere due to pain and overall weakness. She lives alone but has help as needed.  -I highly recommend PT as she can become a fall risk. She is willing to try. Overall I encouraged her to exercise.   6. Depression -She is on Prozac but is still having symptoms. -She notes although she may want to get up and be active she feels like she cannot due to her pain and her lack of motivation.    Plan -Send PT referral to [place], [place]  -Anemia has resolved.  Given her very low risk of multiple myeloma, advanced age and multiple comorbidities, I recommend her follow-up with Dr. Warrick Parisian only. No need to repeat SPEP unless she develops anemia, renal insufficiency or hypercalcemia etc.  Her daughter knows to call me if she has any concerns.  -I will see her as needed in the future.   No problem-specific Assessment & Plan notes found for this encounter.   Orders Placed This Encounter  Procedures  . Ambulatory referral to Physical Therapy    Referral Priority:   Routine    Referral Type:   Physical Medicine    Referral Reason:   Specialty Services Required    Requested Specialty:   Physical  Therapy    Number of Visits Requested:   1   All questions were answered. The patient knows to call the clinic with any problems, questions or concerns. No barriers to learning was detected. I spent 15 minutes counseling the patient face to face. The total time spent in the appointment was 20 minutes and more than  50% was on counseling and review of test results     Truitt Merle, MD 11/24/2019   I, Joslyn Devon, am acting as scribe for Truitt Merle, MD.   I have reviewed the above documentation for accuracy and completeness, and I agree with the above.

## 2019-11-24 ENCOUNTER — Encounter: Payer: Self-pay | Admitting: Hematology

## 2019-11-24 ENCOUNTER — Inpatient Hospital Stay: Payer: Medicare HMO | Attending: Hematology

## 2019-11-24 ENCOUNTER — Other Ambulatory Visit: Payer: Self-pay

## 2019-11-24 ENCOUNTER — Other Ambulatory Visit: Payer: Self-pay | Admitting: Hematology

## 2019-11-24 ENCOUNTER — Inpatient Hospital Stay: Payer: Medicare HMO | Admitting: Hematology

## 2019-11-24 VITALS — BP 111/68 | HR 90 | Temp 98.3°F | Resp 18 | Ht 67.0 in | Wt 193.6 lb

## 2019-11-24 DIAGNOSIS — D472 Monoclonal gammopathy: Secondary | ICD-10-CM

## 2019-11-24 DIAGNOSIS — F329 Major depressive disorder, single episode, unspecified: Secondary | ICD-10-CM | POA: Insufficient documentation

## 2019-11-24 DIAGNOSIS — M1990 Unspecified osteoarthritis, unspecified site: Secondary | ICD-10-CM | POA: Insufficient documentation

## 2019-11-24 DIAGNOSIS — Z79899 Other long term (current) drug therapy: Secondary | ICD-10-CM | POA: Insufficient documentation

## 2019-11-24 DIAGNOSIS — W19XXXA Unspecified fall, initial encounter: Secondary | ICD-10-CM

## 2019-11-24 DIAGNOSIS — I110 Hypertensive heart disease with heart failure: Secondary | ICD-10-CM | POA: Diagnosis not present

## 2019-11-24 DIAGNOSIS — D509 Iron deficiency anemia, unspecified: Secondary | ICD-10-CM

## 2019-11-24 DIAGNOSIS — F419 Anxiety disorder, unspecified: Secondary | ICD-10-CM | POA: Diagnosis not present

## 2019-11-24 DIAGNOSIS — F0390 Unspecified dementia without behavioral disturbance: Secondary | ICD-10-CM | POA: Insufficient documentation

## 2019-11-24 DIAGNOSIS — R69 Illness, unspecified: Secondary | ICD-10-CM | POA: Diagnosis not present

## 2019-11-24 LAB — CBC WITH DIFFERENTIAL (CANCER CENTER ONLY)
Abs Immature Granulocytes: 0.01 10*3/uL (ref 0.00–0.07)
Basophils Absolute: 0 10*3/uL (ref 0.0–0.1)
Basophils Relative: 1 %
Eosinophils Absolute: 0.1 10*3/uL (ref 0.0–0.5)
Eosinophils Relative: 2 %
HCT: 38.1 % (ref 36.0–46.0)
Hemoglobin: 12.1 g/dL (ref 12.0–15.0)
Immature Granulocytes: 0 %
Lymphocytes Relative: 34 %
Lymphs Abs: 1.5 10*3/uL (ref 0.7–4.0)
MCH: 30.8 pg (ref 26.0–34.0)
MCHC: 31.8 g/dL (ref 30.0–36.0)
MCV: 96.9 fL (ref 80.0–100.0)
Monocytes Absolute: 0.4 10*3/uL (ref 0.1–1.0)
Monocytes Relative: 9 %
Neutro Abs: 2.4 10*3/uL (ref 1.7–7.7)
Neutrophils Relative %: 54 %
Platelet Count: 192 10*3/uL (ref 150–400)
RBC: 3.93 MIL/uL (ref 3.87–5.11)
RDW: 13.7 % (ref 11.5–15.5)
WBC Count: 4.4 10*3/uL (ref 4.0–10.5)
nRBC: 0 % (ref 0.0–0.2)

## 2019-11-24 LAB — CMP (CANCER CENTER ONLY)
ALT: 26 U/L (ref 0–44)
AST: 32 U/L (ref 15–41)
Albumin: 3.6 g/dL (ref 3.5–5.0)
Alkaline Phosphatase: 99 U/L (ref 38–126)
Anion gap: 7 (ref 5–15)
BUN: 13 mg/dL (ref 8–23)
CO2: 30 mmol/L (ref 22–32)
Calcium: 9 mg/dL (ref 8.9–10.3)
Chloride: 105 mmol/L (ref 98–111)
Creatinine: 0.81 mg/dL (ref 0.44–1.00)
GFR, Est AFR Am: >60 mL/min (ref >60)
GFR, Estimated: >60 mL/min (ref >60)
Glucose, Bld: 110 mg/dL — ABNORMAL HIGH (ref 70–99)
Potassium: 3.7 mmol/L (ref 3.5–5.1)
Sodium: 142 mmol/L (ref 135–145)
Total Bilirubin: 0.4 mg/dL (ref 0.3–1.2)
Total Protein: 6.2 g/dL — ABNORMAL LOW (ref 6.5–8.1)

## 2019-11-24 LAB — IRON AND TIBC
Iron: 63 ug/dL (ref 28–170)
Saturation Ratios: 20 % (ref 10.4–31.8)
TIBC: 315 ug/dL (ref 250–450)
UIBC: 252 ug/dL

## 2019-11-24 LAB — FERRITIN: Ferritin: 7 ng/mL — ABNORMAL LOW (ref 11–307)

## 2019-11-27 ENCOUNTER — Telehealth: Payer: Self-pay | Admitting: Hematology

## 2019-11-27 LAB — KAPPA/LAMBDA LIGHT CHAINS
Kappa free light chain: 32.5 mg/L — ABNORMAL HIGH (ref 3.3–19.4)
Kappa, lambda light chain ratio: 1.01 (ref 0.26–1.65)
Lambda free light chains: 32.1 mg/L — ABNORMAL HIGH (ref 5.7–26.3)

## 2019-11-27 NOTE — Telephone Encounter (Signed)
Per 12/4 los f/u open

## 2019-11-28 ENCOUNTER — Encounter: Payer: Self-pay | Admitting: Pulmonary Disease

## 2019-11-28 ENCOUNTER — Ambulatory Visit (INDEPENDENT_AMBULATORY_CARE_PROVIDER_SITE_OTHER): Payer: Medicare HMO | Admitting: Pulmonary Disease

## 2019-11-28 ENCOUNTER — Other Ambulatory Visit: Payer: Self-pay

## 2019-11-28 VITALS — BP 116/70 | HR 81 | Temp 97.8°F | Ht 67.0 in | Wt 192.2 lb

## 2019-11-28 DIAGNOSIS — J986 Disorders of diaphragm: Secondary | ICD-10-CM

## 2019-11-28 DIAGNOSIS — G4733 Obstructive sleep apnea (adult) (pediatric): Secondary | ICD-10-CM | POA: Diagnosis not present

## 2019-11-28 NOTE — Progress Notes (Signed)
Subjective:    Patient ID: [name] [name], female    DOB: 12/12/1941, 78 y.o.   MRN: [id number]  HPI  78 year old presents for evaluation of obstructive sleep apnea , accompanied by her daughter She is only here because she has been referred by her primary who wanted her to trial CPAP again.  She states right at the start of her interview that she used CPAP 15 years ago and was not able to tolerate this and does not want to do this again She has been maintained on Lunesta for many years for sleep onset insomnia after the death of her husband.  She lives by herself and no bed partner history is available but daughter provides some corroboration. Epworth sleepiness score is 3 but I feel that she is under reporting. Bedtime is as late as 2 AM she stays in bed reading or watching TV, when she takes her Johnnye Sima does not take her long to fall asleep, she sleeps on her side with 2 pillows and reports 1-2 nocturnal awakenings for nocturia and is out of bed sometimes as late as 2 PM still feeling tired with dryness of mouth but denies headaches. There is no history suggestive of cataplexy, sleep paralysis or parasomnias  Daughter has noted mumbling in her sleep  I reviewed her prior sleep study and it seems that she was difficult titration due to treatment emergent central apneas  PMH -chronic diastolic heart failure, atrial fibrillation on Eliquis, hiatal hernia, chronic pain on narcotics  Chest x-ray 10/2018 shows stable elevation of right hemidiaphragm with moderate hiatal hernia  Significant tests/ events reviewed  10/2006 NPSG-RDI 68/hour, lowest desaturation 86%  07/2007 CPAP titrated from 6 to 21 cm, then BiPAP 23/21 with residual events persistent  CT chest 11/2016-large hiatal hernia 11/2018 echo-EF 55%, RVSP 35   Past Medical History:  Diagnosis Date  . Abnormal nuclear cardiac imaging test   . Anemia   . Anemia, iron deficiency 07/02/2015  . Anxiety   . Arthritis    "knees,  back, fingers, toes; joints" (01/07/2015)  . 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 CAD (coronary artery disease)    a. Abnl nuc 11/2014. Cath 12/2014 - turned down for CABG. Ultimately s/p TTVP, rotational atherectomy, PTCA and stenting of the ostial LCx and left main into the LAD (crush technique), and IVUS of the LAD/Left main. // b. Myoview 11/17: EF 48, poor quality/significant artifact; inf-lateral, inferior ischemia; Intermediate Risk  . Chronic atrial fibrillation ([place])    a.  First noted post-op 9/15 spinal fusion. She had cardioversion, not on anticoagulation. Fall risk, unsteady. // failed DCCV // Holter 10/17: AFib, Avg HR 97, PVCs, no other arrhythmia  . Chronic back pain greater than 3 months duration    a. spinal stenosis. Spinal fusion with rods in 2/15 at [facility] spinal fusion 9/15.  Marland Kitchen Chronic diastolic CHF (congestive heart failure) ([place])   . Circadian rhythm sleep disorder   . CTS (carpal tunnel syndrome)   . Deficiency anemia 11/10/2014  . Depression   . Diarrhea   . Diverticulitis of colon   . Esophageal stricture   . Gastritis   . Gastroesophageal hernia 07/06/2013  . GERD (gastroesophageal reflux disease)   . Hiatal hernia   . History of blood transfusion    "most of them related to OR's"   . History of echocardiogram    a. Echo 2/17:  EF 50-55%, trivial AI, midl MR, mod LAE, PASP 37 mmHg  . HTN (hypertension)   .  Hyperlipidemia   . IBS (irritable bowel syndrome)   . Incontinence 10/13/2012  . Insomnia   . Ischemic chest pain ([place])   . Memory disorder 12/04/2014  . Metabolic syndrome 02/25/487  . MGUS (monoclonal gammopathy of unknown significance) dx'd 11/2014   a. Neg BMB 11/2014.  . Multiple falls   . Obesity   . Obstructive sleep apnea    "have mask; don't wear it" (01/07/2015)  . Orthostasis   . PAT (paroxysmal atrial tachycardia) ([place])   . Personal history of colonic polyps 10/25/2011 & 12/02/11   not retrieved Dr Lyla Son & tubular adenomas   . Pneumonia 03/2014  . Sinus bradycardia    a. Baseline HR 50s-60s.  . Stroke [facility]) early 2000's   "small"; denies residual on 01/07/2015)    Past Surgical History:  Procedure Laterality Date  . BACK SURGERY    . BONE MARROW BIOPSY  11/2014  . CARDIAC CATHETERIZATION  12/27/2014   Procedure: INTRAVASCULAR PRESSURE WIRE/FFR STUDY;  Surgeon: Larey Dresser, MD;  Location: [facility] CATH LAB;  Service: Cardiovascular;;  . CARDIAC CATHETERIZATION N/A 11/25/2016   Procedure: Left Heart Cath and Coronary Angiography;  Surgeon: Sherren Mocha, MD;  Location: [facility] CV LAB;  Service: Cardiovascular;  Laterality: N/A;  . CARDIOVERSION N/A 02/13/2016   Procedure: CARDIOVERSION;  Surgeon: Josue Hector, MD;  Location: [facility] ENDOSCOPY;  Service: Cardiovascular;  Laterality: N/A;  . CARPAL TUNNEL RELEASE Right 1980's  . CATARACT EXTRACTION Bilateral   . CATARACT EXTRACTION W/ INTRAOCULAR LENS  IMPLANT, BILATERAL  2000's  . CORONARY ANGIOPLASTY WITH STENT PLACEMENT  01/07/2015   "2"  . CORONARY STENT PLACEMENT    . DILATION AND CURETTAGE OF UTERUS    . ESOPHAGOGASTRODUODENOSCOPY (EGD) WITH ESOPHAGEAL DILATION  "several times"  . JOINT REPLACEMENT    . KNEE ARTHROSCOPY Left 1995  . LAPAROSCOPIC CHOLECYSTECTOMY  2003  . LEFT HEART CATHETERIZATION WITH CORONARY ANGIOGRAM N/A 12/27/2014   Procedure: LEFT HEART CATHETERIZATION WITH CORONARY ANGIOGRAM;  Surgeon: Larey Dresser, MD;  Location: [facility] CATH LAB;  Service: Cardiovascular;  Laterality: N/A;  . PERCUTANEOUS CORONARY ROTOBLATOR INTERVENTION (PCI-R)  01/07/2015  . PERCUTANEOUS CORONARY ROTOBLATOR INTERVENTION (PCI-R) N/A 01/07/2015   Procedure: PERCUTANEOUS CORONARY ROTOBLATOR INTERVENTION (PCI-R);  Surgeon: Blane Ohara, MD;  Location: [facility] CATH LAB;  Service: Cardiovascular;  Laterality: N/A;  . POSTERIOR FUSION THORACIC SPINE  08/2015  . POSTERIOR LUMBAR FUSION  01/2015  . TOTAL KNEE ARTHROPLASTY Bilateral 1990's - 2000's    Allergies  Allergen  Reactions  . Buprenorphine Hcl Itching  . Cymbalta [Duloxetine Hcl] Other (See Comments)    Other reaction(s): Other (See Comments) Personality changes - crying  2014  . Morphine And Related Itching  . Oxycodone-Acetaminophen Other (See Comments)    Personality changes   . Valium [Diazepam] Other (See Comments)    Personality changes - "in another world" ;  Hallucinations  2015  . Statins Other (See Comments)    Other reaction(s): Other (See Comments) Muscle weakness Muscle weakness  . Altace [Ramipril] Other (See Comments)    unknown  . Floxin [Ofloxacin] Other (See Comments)    unknown  . Hydromorphone Other (See Comments)    Other reaction(s): Confusion (intolerance) unknown  . Lovaza [Omega-3-Acid Ethyl Esters] Other (See Comments)    Muscle weakness   . Trilipix [Choline Fenofibrate] Other (See Comments)    Muscle weakness   . Zetia [Ezetimibe] Other (See Comments)    Muscle weakness     Social History  Socioeconomic History  . Marital status: Widowed    Spouse name: Not on file  . Number of children: 2  . Years of education: HS  . Highest education level: Not on file  Occupational History  . Occupation: Licensed conveyancer- retired    Comment: retired  Scientific laboratory technician  . Financial resource strain: Not on file  . Food insecurity    Worry: Not on file    Inability: Not on file  . Transportation needs    Medical: Not on file    Non-medical: Not on file  Tobacco Use  . Smoking status: Never Smoker  . Smokeless tobacco: Never Used  Substance and Sexual Activity  . Alcohol use: No    Comment: 01/07/2015 "glass of wine at Christmas, maybe"  . Drug use: No  . Sexual activity: Never  Lifestyle  . Physical activity    Days per week: Not on file    Minutes per session: Not on file  . Stress: Not on file  Relationships  . Social Herbalist on phone: Not on file    Gets together: Not on file    Attends religious service: Not on file    Active member of club or  organization: Not on file    Attends meetings of clubs or organizations: Not on file    Relationship status: Not on file  . Intimate partner violence    Fear of current or ex partner: Not on file    Emotionally abused: Not on file    Physically abused: Not on file    Forced sexual activity: Not on file  Other Topics Concern  . Not on file  Social History Narrative   Patient is right handed   Patient drinks 1-2 sodas daily.   patlient lives alone.    Family History  Problem Relation Age of Onset  . Coronary artery disease Father   . Peripheral vascular disease Father   . Coronary artery disease Mother   . Coronary artery disease Brother   . Colon cancer Sister   . Emphysema Sister   . Sleep apnea Son   . Colon cancer Sister        spread to her brain  . Emphysema Brother   . Dementia Neg Hx      Review of Systems Constitutional: negative for anorexia, fevers and sweats  Eyes: negative for irritation, redness and visual disturbance  Ears, nose, mouth, throat, and face: negative for earaches, epistaxis, nasal congestion and sore throat  Respiratory: negative for cough, sputum and wheezing  dyspnea on exertion +, Cardiovascular: negative for chest pain,  orthopnea, palpitations and syncope  + lower extremity edema, Gastrointestinal: negative for abdominal pain, constipation, diarrhea, melena, nausea and vomiting  Genitourinary:negative for dysuria, frequency and hematuria  Hematologic/lymphatic: negative for bleeding, easy bruising and lymphadenopathy  Musculoskeletal:negative for arthralgias, muscle weakness , + stiff joints  Neurological: negative for coordination problems, gait problems, headaches and weakness  Endocrine: negative for diabetic symptoms including polydipsia, polyuria and weight loss     Objective:   Physical Exam  Gen. Pleasant, elderly , in no distress, normal affect ENT - no pallor,icterus, no post nasal drip, class 2-3 airway Neck: No JVD, no  thyromegaly, no carotid bruits Lungs: no use of accessory muscles, no dullness to percussion, decreased without rales or rhonchi  Cardiovascular: Rhythm regular, heart sounds  normal, no murmurs or gallops, 1+ peripheral edema Abdomen: soft and non-tender, no hepatosplenomegaly, BS normal. Musculoskeletal: No deformities, no  cyanosis or clubbing Neuro:  alert, non focal, no tremors       Assessment & Plan:

## 2019-11-28 NOTE — Patient Instructions (Signed)
Schedule CPAP titration study

## 2019-11-29 ENCOUNTER — Encounter: Payer: Self-pay | Admitting: Family Medicine

## 2019-11-29 ENCOUNTER — Ambulatory Visit: Payer: Medicare HMO | Admitting: Family Medicine

## 2019-11-29 VITALS — BP 106/69 | HR 78 | Temp 97.7°F | Ht 67.0 in | Wt 192.8 lb

## 2019-11-29 DIAGNOSIS — F331 Major depressive disorder, recurrent, moderate: Secondary | ICD-10-CM | POA: Diagnosis not present

## 2019-11-29 DIAGNOSIS — R69 Illness, unspecified: Secondary | ICD-10-CM | POA: Diagnosis not present

## 2019-11-29 LAB — IMMUNOFIXATION REFLEX, SERUM
IgA: 147 mg/dL (ref 64–422)
IgG (Immunoglobin G), Serum: 860 mg/dL (ref 586–1602)
IgM (Immunoglobulin M), Srm: 86 mg/dL (ref 26–217)

## 2019-11-29 LAB — PROTEIN ELECTROPHORESIS, SERUM, WITH REFLEX
A/G Ratio: 1.4 (ref 0.7–1.7)
Albumin ELP: 3.5 g/dL (ref 2.9–4.4)
Alpha-1-Globulin: 0.2 g/dL (ref 0.0–0.4)
Alpha-2-Globulin: 0.7 g/dL (ref 0.4–1.0)
Beta Globulin: 0.8 g/dL (ref 0.7–1.3)
Gamma Globulin: 0.8 g/dL (ref 0.4–1.8)
Globulin, Total: 2.5 g/dL (ref 2.2–3.9)
M-Spike, %: 0.3 g/dL — ABNORMAL HIGH
SPEP Interpretation: 0
Total Protein ELP: 6 g/dL (ref 6.0–8.5)

## 2019-11-29 NOTE — Assessment & Plan Note (Signed)
Not sure whether elevated right hemidiaphragm plays a role or not also note chronic narcotics -PAP would be of benefit for both of these conditions

## 2019-11-29 NOTE — Progress Notes (Signed)
BP 106/69   Pulse 78   Temp 97.7 F (36.5 C) (Temporal)   Ht 5\' 7"  (1.702 m)   Wt 192 lb 12.8 oz (87.5 kg)   SpO2 94%   BMI 30.20 kg/m    Subjective:   Patient ID: [name] [name], female    DOB: [birth year], 78 y.o.   MRN: [id number]  HPI: Esperanza Dominy Croy is a 78 y.o. female presenting on 11/29/2019 for Depression (4 week follow up- did wellbutron x 4 weeks and states it did not help and made vison blurry)   HPI Patient is coming in for recheck of depression.  She did Wellbutrin for 4 weeks and then she felt like it did not help and made her vision blurry so she stopped it.  She is coming in today and says that she is feeling fine and denies any major issues with it.  She would just like to keep on the Prozac for now  Relevant past medical, surgical, family and social history reviewed and updated as indicated. Interim medical history since our last visit reviewed. Allergies and medications reviewed and updated.  Review of Systems  Constitutional: Negative for chills and fever.  Respiratory: Negative for chest tightness and shortness of breath.   Cardiovascular: Negative for chest pain and leg swelling.  Musculoskeletal: Positive for myalgias. Negative for back pain and gait problem.  Skin: Negative for rash.  Neurological: Negative for light-headedness and headaches.  Psychiatric/Behavioral: Positive for dysphoric mood. Negative for agitation, behavioral problems, self-injury, sleep disturbance and suicidal ideas. The patient is nervous/anxious.   All other systems reviewed and are negative.   Per HPI unless specifically indicated above   Allergies as of 11/29/2019      Reactions   Buprenorphine Hcl Itching   Cymbalta [duloxetine Hcl] Other (See Comments)   Other reaction(s): Other (See Comments) Personality changes - crying  2014   Morphine And Related Itching   Oxycodone-acetaminophen Other (See Comments)   Personality changes    Valium [diazepam] Other (See Comments)    Personality changes - "in another world" ;  Hallucinations  2015   Statins Other (See Comments)   Other reaction(s): Other (See Comments) Muscle weakness Muscle weakness   Altace [ramipril] Other (See Comments)   unknown   Floxin [ofloxacin] Other (See Comments)   unknown   Hydromorphone Other (See Comments)   Other reaction(s): Confusion (intolerance) unknown   Lovaza [omega-3-acid Ethyl Esters] Other (See Comments)   Muscle weakness    Trilipix [choline Fenofibrate] Other (See Comments)   Muscle weakness    Zetia [ezetimibe] Other (See Comments)   Muscle weakness       Medication List       Accurate as of November 29, 2019  2:05 PM. If you have any questions, ask your nurse or doctor.        aspirin EC 81 MG tablet Take 1 tablet (81 mg total) by mouth daily.   CALCIUM + D PO Take 1 tablet by mouth daily.   clobetasol cream 0.05 % Commonly known as: TEMOVATE Apply 1 application topically See admin instructions. Applies to vaginal area twice a week.   diltiazem 120 MG 24 hr capsule Commonly known as: CARDIZEM CD Take 1 capsule (120 mg total) by mouth 2 (two) times daily.   diphenoxylate-atropine 2.5-0.025 MG tablet Commonly known as: LOMOTIL Take 1 tablet by mouth 4 (four) times daily as needed for diarrhea or loose stools.   Eliquis 5 MG Tabs tablet Generic drug:  apixaban Take 1 tablet (5 mg total) by mouth 2 (two) times daily.   Eszopiclone 3 MG Tabs Take 1 tablet (3 mg total) by mouth at bedtime. For insomnia,take immediately before bedtime.   fentaNYL 50 MCG/HR Commonly known as: DURAGESIC Place 1 patch (50 mcg total) onto the skin every 3 (three) days.   FLUoxetine 40 MG capsule Commonly known as: PROZAC Take 1 capsule (40 mg total) by mouth daily.   furosemide 80 MG tablet Commonly known as: LASIX Take 1 tablet (80 mg total) by mouth daily.   HYDROcodone-acetaminophen 10-325 MG tablet Commonly known as: NORCO Take 1 tablet by mouth every 4  (four) hours as needed for moderate pain.   LORazepam 0.5 MG tablet Commonly known as: ATIVAN Takes as needed   methocarbamol 500 MG tablet Commonly known as: ROBAXIN Take 500 mg by mouth 4 (four) times daily as needed for muscle spasms.   multivitamin tablet Take 1 tablet by mouth daily. For supplement   nitroGLYCERIN 0.4 MG SL tablet Commonly known as: Nitrostat Place 1 tablet (0.4 mg total) under the tongue every 5 (five) minutes as needed for chest pain (up to 3 doses).   pantoprazole 40 MG tablet Commonly known as: PROTONIX Take 1 tablet (40 mg total) by mouth 2 (two) times daily.   potassium chloride SA 20 MEQ tablet Commonly known as: KLOR-CON Take 1 tablet (20 mEq total) by mouth daily.   rosuvastatin 10 MG tablet Commonly known as: CRESTOR Take 1 tablet (10 mg total) by mouth daily. What changed: additional instructions   Vitamin D 50 MCG (2000 UT) tablet Take 2,000 Units by mouth daily.        Objective:   BP 106/69   Pulse 78   Temp 97.7 F (36.5 C) (Temporal)   Ht 5\' 7"  (1.702 m)   Wt 192 lb 12.8 oz (87.5 kg)   SpO2 94%   BMI 30.20 kg/m   Wt Readings from Last 3 Encounters:  11/29/19 192 lb 12.8 oz (87.5 kg)  11/28/19 192 lb 3.2 oz (87.2 kg)  11/24/19 193 lb 9.6 oz (87.8 kg)    Physical Exam Vitals signs and nursing note reviewed.  Constitutional:      General: She is not in acute distress.    Appearance: She is well-developed. She is not diaphoretic.  Eyes:     Conjunctiva/sclera: Conjunctivae normal.  Cardiovascular:     Rate and Rhythm: Normal rate and regular rhythm.     Heart sounds: Normal heart sounds. No murmur.  Pulmonary:     Effort: Pulmonary effort is normal. No respiratory distress.     Breath sounds: Normal breath sounds. No wheezing.  Musculoskeletal: Normal range of motion.        General: No tenderness.  Skin:    General: Skin is warm and dry.     Findings: No rash.  Neurological:     Mental Status: She is alert and  oriented to person, place, and time.     Coordination: Coordination normal.  Psychiatric:        Behavior: Behavior normal.       Assessment & Plan:   Problem List Items Addressed This Visit      Other   Depression - Primary      Patient feels like her depression is going well and she does not need anything else.  She is currently back to just taking the fluoxetine, she did not like the Wellbutrin. Follow up plan: Return in about  3 months (around 02/27/2020), or if symptoms worsen or fail to improve, for Recheck labs and chronic issues.  Counseling provided for all of the vaccine components No orders of the defined types were placed in this encounter.   Caryl Pina, MD [facility] 11/29/2019, 2:05 PM

## 2019-11-29 NOTE — Assessment & Plan Note (Signed)
She seems to have treatment emergent central apneas and was a difficult titration.  This may be on the basis of her heart failure and narcotic usage.  After much discussion with her daughter present, we were able to convince her to go through a repeat titration study.  I am hopeful we can find a suitable mask interface for her and the study will give [place] the opportunity to get her comfortable.  She may very well need BiPAP or ASV settings, she does have normal ejection fraction so would be okay to use ASV if required during the titration study  The cardiovascular benefits of PAP were discussed with the patient.

## 2019-12-04 ENCOUNTER — Ambulatory Visit: Payer: Medicare HMO | Admitting: Podiatry

## 2019-12-04 ENCOUNTER — Other Ambulatory Visit: Payer: Self-pay

## 2019-12-04 ENCOUNTER — Encounter: Payer: Self-pay | Admitting: Podiatry

## 2019-12-04 DIAGNOSIS — L84 Corns and callosities: Secondary | ICD-10-CM

## 2019-12-04 DIAGNOSIS — M79675 Pain in left toe(s): Secondary | ICD-10-CM | POA: Diagnosis not present

## 2019-12-04 DIAGNOSIS — G609 Hereditary and idiopathic neuropathy, unspecified: Secondary | ICD-10-CM

## 2019-12-04 DIAGNOSIS — R52 Pain, unspecified: Secondary | ICD-10-CM | POA: Diagnosis not present

## 2019-12-04 DIAGNOSIS — B351 Tinea unguium: Secondary | ICD-10-CM

## 2019-12-04 DIAGNOSIS — M79674 Pain in right toe(s): Secondary | ICD-10-CM | POA: Diagnosis not present

## 2019-12-04 NOTE — Patient Instructions (Signed)

## 2019-12-08 ENCOUNTER — Telehealth: Payer: Self-pay | Admitting: Family Medicine

## 2019-12-08 ENCOUNTER — Other Ambulatory Visit: Payer: Self-pay | Admitting: Family Medicine

## 2019-12-08 NOTE — Telephone Encounter (Signed)
Patient notified and verbalized understanding. 

## 2019-12-08 NOTE — Telephone Encounter (Signed)
Patient aware and verbalized understanding. She is going to call mail order and get proof meds was lost in mail

## 2019-12-08 NOTE — Telephone Encounter (Signed)
Unfortunately due to [facility] being controlled we cannot do that, we may think about not having it in her mail order in the future. Caryl Pina, MD [facility] 12/08/2019, 12:05 PM

## 2019-12-08 NOTE — Telephone Encounter (Signed)
Pt called she needs a 7 day rx for Lunesta called to [place] until mail order is delivered due to delay. Please call patient to let know it is called in.

## 2019-12-11 ENCOUNTER — Telehealth: Payer: Self-pay

## 2019-12-11 NOTE — Telephone Encounter (Signed)
Spoke with patient's daughter [name] regarding lab results, iron level is low, Dr. Burr Medico recommends taking ferrous sulfate one to two daily.  She verbalized an understanding.

## 2019-12-11 NOTE — Telephone Encounter (Signed)
-----   Message from Truitt Merle, MD sent at 12/11/2019 10:35 AM EST ----- Please call pt or her daughter and let her know the lab results, her iron level is low again, no anemia, I do not see oral iron on her medication list, please advise her to start ferrous sulfate 1-2 tab daily. Her M-protein level is very low, I discussed with her during her visit that it's OK to stop checking it.   Thanks   Truitt Merle  12/11/2019

## 2019-12-11 NOTE — Telephone Encounter (Signed)
Left message for patient's daughter Suanne Marker regarding lab results.  Per Dr. Burr Medico iron level is low again, no anemia.  Instructed her to take ferrous sulfate 1 to 2 tabs daily.  M-protein level is very low.  Dr. Burr Medico feels it is okay to stop checking this.    Encouraged her to call back if she has any questions.

## 2019-12-11 NOTE — Progress Notes (Signed)
Subjective: [name] [name] presents today with painful, thick toenails 1-5 b/l that she cannot cut and which interfere with daily activities.  Pain is aggravated when wearing enclosed shoe gear.  Her daughter is present during the visit.   Current Outpatient Medications on File Prior to Visit  Medication Sig Dispense Refill  . aspirin EC 81 MG tablet Take 1 tablet (81 mg total) by mouth daily. 90 tablet 3  . Calcium Citrate-Vitamin D (CALCIUM + D PO) Take 1 tablet by mouth daily.    . Cholecalciferol (VITAMIN D) 2000 units tablet Take 2,000 Units by mouth daily.    . clobetasol cream (TEMOVATE) [id number] % Apply 1 application topically See admin instructions. Applies to vaginal area twice a week.    . diclofenac (FLECTOR) 1.3 % PTCH Flector 1.3 % transdermal 12 hour patch  Apply 1 patch twice a day by transdermal route.    . diltiazem (CARDIZEM CD) 120 MG 24 hr capsule Take 1 capsule (120 mg total) by mouth 2 (two) times daily. 180 capsule 3  . diphenoxylate-atropine (LOMOTIL) 2.5-0.025 MG per tablet Take 1 tablet by mouth 4 (four) times daily as needed for diarrhea or loose stools.     Marland Kitchen ELIQUIS 5 MG TABS tablet Take 1 tablet (5 mg total) by mouth 2 (two) times daily. 180 tablet 2  . Eszopiclone 3 MG TABS Take 1 tablet (3 mg total) by mouth at bedtime. For insomnia,take immediately before bedtime. 90 tablet 1  . fentaNYL (DURAGESIC - DOSED MCG/HR) 50 MCG/HR Place 1 patch (50 mcg total) onto the skin every 3 (three) days. 10 patch 0  . FLUoxetine (PROZAC) 40 MG capsule Take 1 capsule (40 mg total) by mouth daily. 90 capsule 3  . furosemide (LASIX) 80 MG tablet Take 1 tablet (80 mg total) by mouth daily. 90 tablet 3  . HYDROcodone-acetaminophen (NORCO) 10-325 MG per tablet Take 1 tablet by mouth every 4 (four) hours as needed for moderate pain.     Marland Kitchen LORazepam (ATIVAN) 0.5 MG tablet Takes as needed    . methocarbamol (ROBAXIN) 500 MG tablet Take 500 mg by mouth 4 (four) times daily as needed for  muscle spasms.    . Multiple Vitamin (MULTIVITAMIN) tablet Take 1 tablet by mouth daily. For supplement    . nitroGLYCERIN (NITROSTAT) 0.4 MG SL tablet Place 1 tablet (0.4 mg total) under the tongue every 5 (five) minutes as needed for chest pain (up to 3 doses). 25 tablet 3  . pantoprazole (PROTONIX) 40 MG tablet Take 1 tablet (40 mg total) by mouth 2 (two) times daily. 180 tablet 3  . potassium chloride SA (K-DUR) 20 MEQ tablet Take 1 tablet (20 mEq total) by mouth daily. 90 tablet 3  . rosuvastatin (CRESTOR) 10 MG tablet Take 1 tablet (10 mg total) by mouth daily. (Patient taking differently: Take 10 mg by mouth daily. Alternates 10 mg and 5 mg) 90 tablet 0   No current facility-administered medications on file prior to visit.    Allergies  Allergen Reactions  . Buprenorphine Hcl Itching  . Cymbalta [Duloxetine Hcl] Other (See Comments)    Other reaction(s): Other (See Comments) Personality changes - crying  2014  . Morphine And Related Itching  . Oxycodone-Acetaminophen Other (See Comments)    Personality changes   . Valium [Diazepam] Other (See Comments)    Personality changes - "in another world" ;  Hallucinations  2015  . Statins Other (See Comments)    Other reaction(s): Other (See  Comments) Muscle weakness Muscle weakness  . Altace [Ramipril] Other (See Comments)    unknown  . Floxin [Ofloxacin] Other (See Comments)    unknown  . Hydromorphone Other (See Comments)    Other reaction(s): Confusion (intolerance) unknown  . Lovaza [Omega-3-Acid Ethyl Esters] Other (See Comments)    Muscle weakness   . Trilipix [Choline Fenofibrate] Other (See Comments)    Muscle weakness   . Zetia [Ezetimibe] Other (See Comments)    Muscle weakness    Objective: There were no vitals filed for this visit.  Vascular Examination: Capillary refill time immediate b/l.   Dorsalis pedis pulses palpable b/l.   Posterior tibial pulses faintly palpable b/l  Digital hair present b/l.  Skin  temperature gradient WNL b/l  Dermatological Examination: Skin with normal turgor, texture and tone b/l  Toenails 2-5 b/l discolored, thick, dystrophic with subungual debris and pain with palpation to nailbeds due to thickness of nails.  Anonychia b/l great  toes with evidence of permanent total nail avulsion. Residual spicule noted lateral border righ hallux and medial border left hallux. Nailbeds completely epithelialized and intact.  Porokeratotic lesions submet head 5 right foot with tenderness to palpation. No erythema, no edema, no drainage, no flocculence.  Musculoskeletal: Muscle strength 5/5 to all LE muscle groups  Hammertoes 2nd toe right foot.   No pain, crepitus or joint limitation noted with ROM.   Neurological: Sensation intact with 10 gram monofilament.  Vibratory sensation diminished b/l.  Assessment: Painful onychomycosis toenails 2-5 b/l  Callus submet head 5 right foot Patient on long term blood thinner Neuropathy  Plan: 1. Toenails 2-5 b/l were debrided in length and girth without iatrogenic bleeding.  2. Calluses pared submetatarsal head 5 right foot utilizing sterile scalpel blade without incident. 3. Patient to continue soft, supportive shoe gear daily. 4. Patient to report any pedal injuries to medical professional immediately. 5. Follow up 9 weeks. 6. Patient/POA to call should there be a concern in the interim.

## 2019-12-25 ENCOUNTER — Other Ambulatory Visit (HOSPITAL_COMMUNITY): Payer: Medicare HMO

## 2019-12-26 ENCOUNTER — Ambulatory Visit: Payer: Medicare HMO | Admitting: Physical Therapy

## 2019-12-27 DIAGNOSIS — M545 Low back pain: Secondary | ICD-10-CM | POA: Diagnosis not present

## 2019-12-28 ENCOUNTER — Encounter (HOSPITAL_BASED_OUTPATIENT_CLINIC_OR_DEPARTMENT_OTHER): Payer: Medicare HMO | Admitting: Pulmonary Disease

## 2020-01-02 DIAGNOSIS — Z1231 Encounter for screening mammogram for malignant neoplasm of breast: Secondary | ICD-10-CM | POA: Diagnosis not present

## 2020-01-04 DIAGNOSIS — R928 Other abnormal and inconclusive findings on diagnostic imaging of breast: Secondary | ICD-10-CM

## 2020-01-04 NOTE — Progress Notes (Signed)
Abnormal/incomplete screening mammogram - orders placed and signed hard copy faxed to [facility] per recommendations.

## 2020-01-06 DIAGNOSIS — M5416 Radiculopathy, lumbar region: Secondary | ICD-10-CM | POA: Diagnosis not present

## 2020-01-06 DIAGNOSIS — M961 Postlaminectomy syndrome, not elsewhere classified: Secondary | ICD-10-CM | POA: Diagnosis not present

## 2020-01-11 ENCOUNTER — Telehealth: Payer: Self-pay | Admitting: *Deleted

## 2020-01-11 NOTE — Telephone Encounter (Signed)
Daughter comes in office and has patient outside in car.   Patient feeling weak and this was first noticed last week when she had injections in her back to relieve pain.  After asking pcp if patient could have a lab to check hemoglobin,(daughter worried and thought this may be issue), he suggested an office visit with labwork but no appointments available this late in the day.   Daughter was advised to take patient to emergency care for full assessment.

## 2020-01-26 DIAGNOSIS — R928 Other abnormal and inconclusive findings on diagnostic imaging of breast: Secondary | ICD-10-CM

## 2020-02-12 ENCOUNTER — Ambulatory Visit: Payer: Medicare HMO | Admitting: Podiatry

## 2020-02-27 ENCOUNTER — Other Ambulatory Visit: Payer: Self-pay | Admitting: Family Medicine

## 2020-02-28 ENCOUNTER — Ambulatory Visit: Payer: Medicare HMO | Admitting: Family Medicine

## 2020-03-04 ENCOUNTER — Telehealth: Payer: Self-pay | Admitting: Family Medicine

## 2020-03-04 NOTE — Telephone Encounter (Signed)
I have no problem with this, I do not know if the reason is because they cannot get into see me or because they do not like the treatment they were going forward with for depression.  I think it is up to Dr. Marjean Donna choice to review the chart and see if she is okay with the transfer. Caryl Pina, MD [facility] 03/04/2020, 4:15 PM

## 2020-03-05 NOTE — Telephone Encounter (Signed)
Patient aware and verbalized understanding. °

## 2020-03-05 NOTE — Telephone Encounter (Signed)
Pt called wanting to speak with Dr Lajuana Ripple nurse regarding their last telephone conversation. Reviewed telephone call and explained to pt that Dr Lajuana Ripple is willing to assist patient with her health needs but was not going to be accepting of prescribing controlled substances. Pt says the only one she really needs is medicine for sleep but if Dr Darnell Level wasn't ok with prescribing that, then she would figure it out. Pt still wants to start seeing Dr Lajuana Ripple. Pt set up appt to see her on 04/01/20.

## 2020-03-05 NOTE — Telephone Encounter (Signed)
I reviewed Dr Dettinger's notes.  I agree with no benzodiazepines (ativan/ lorazepam, etc) given her use of pain medications.  I will be glad to assist with other medical problems if she would like but again with the expectation that I would not be willing to prescribe these substances as I do not think that they are safe given other medical issues.

## 2020-03-25 ENCOUNTER — Ambulatory Visit: Payer: Medicare HMO | Admitting: Podiatry

## 2020-03-25 ENCOUNTER — Encounter: Payer: Self-pay | Admitting: Podiatry

## 2020-03-25 ENCOUNTER — Other Ambulatory Visit: Payer: Self-pay

## 2020-03-25 VITALS — Temp 98.0°F

## 2020-03-25 DIAGNOSIS — M79675 Pain in left toe(s): Secondary | ICD-10-CM | POA: Diagnosis not present

## 2020-03-25 DIAGNOSIS — B351 Tinea unguium: Secondary | ICD-10-CM

## 2020-03-25 DIAGNOSIS — H9190 Unspecified hearing loss, unspecified ear: Secondary | ICD-10-CM | POA: Insufficient documentation

## 2020-03-25 DIAGNOSIS — R1310 Dysphagia, unspecified: Secondary | ICD-10-CM | POA: Insufficient documentation

## 2020-03-25 DIAGNOSIS — M79674 Pain in right toe(s): Secondary | ICD-10-CM | POA: Diagnosis not present

## 2020-03-25 DIAGNOSIS — G609 Hereditary and idiopathic neuropathy, unspecified: Secondary | ICD-10-CM

## 2020-03-25 DIAGNOSIS — Q828 Other specified congenital malformations of skin: Secondary | ICD-10-CM | POA: Diagnosis not present

## 2020-03-25 NOTE — Patient Instructions (Signed)

## 2020-04-01 ENCOUNTER — Other Ambulatory Visit: Payer: Self-pay

## 2020-04-01 ENCOUNTER — Encounter: Payer: Self-pay | Admitting: Family Medicine

## 2020-04-01 ENCOUNTER — Ambulatory Visit: Payer: Medicare HMO | Admitting: Family Medicine

## 2020-04-01 VITALS — BP 114/78 | HR 100 | Temp 97.0°F | Ht 67.0 in | Wt 187.0 lb

## 2020-04-01 DIAGNOSIS — I5032 Chronic diastolic (congestive) heart failure: Secondary | ICD-10-CM | POA: Diagnosis not present

## 2020-04-01 DIAGNOSIS — I10 Essential (primary) hypertension: Secondary | ICD-10-CM | POA: Diagnosis not present

## 2020-04-01 DIAGNOSIS — I4821 Permanent atrial fibrillation: Secondary | ICD-10-CM

## 2020-04-01 DIAGNOSIS — I2510 Atherosclerotic heart disease of native coronary artery without angina pectoris: Secondary | ICD-10-CM

## 2020-04-01 DIAGNOSIS — D472 Monoclonal gammopathy: Secondary | ICD-10-CM

## 2020-04-01 NOTE — Progress Notes (Signed)
Subjective: 14 [address] Kreuser presents today for follow up of at risk foot care with history of peripheral neuropathy and painful porokeratotic lesion(s) plantar right foot and painful mycotic toenails b/l that limit ambulation. Aggravating factors include weightbearing with and without shoe gear. Pain for both is relieved with periodic professional debridement.   She voices no new pedal problems on today's visit.  She remains on blood thinner, Eliquis.   Allergies  Allergen Reactions  . Buprenorphine Hcl Itching  . Cymbalta [Duloxetine Hcl] Other (See Comments)    Other reaction(s): Other (See Comments) Personality changes - crying  2014  . Morphine And Related Itching  . Oxycodone-Acetaminophen Other (See Comments)    Personality changes   . Valium [Diazepam] Other (See Comments)    Personality changes - "in another world" ;  Hallucinations  2015  . Statins Other (See Comments)    Other reaction(s): Other (See Comments) Muscle weakness Muscle weakness  . Altace [Ramipril] Other (See Comments)    unknown  . Floxin [Ofloxacin] Other (See Comments)    unknown  . Hydromorphone Other (See Comments)    Other reaction(s): Confusion (intolerance) unknown  . Lovaza [Omega-3-Acid Ethyl Esters] Other (See Comments)    Muscle weakness   . Trilipix [Choline Fenofibrate] Other (See Comments)    Muscle weakness   . Zetia [Ezetimibe] Other (See Comments)    Muscle weakness      Objective: Vitals:   03/25/20 1531  Temp: 3 F (36.7 C)    Pt 79 y.o. year old Caucasian female  in NAD. AAO x 3.   Vascular Examination:  Capillary refill time to digits immediate b/l. Palpable DP pulses b/l. Faintly palpable PT pulses b/l. Pedal hair present b/l. Skin temperature gradient within normal limits b/l.  Dermatological Examination: Pedal skin with normal turgor, texture and tone bilaterally. No open wounds bilaterally. No interdigital macerations bilaterally. Toenails 2-5 bilaterally elongated,  dystrophic, thickened, and crumbly with subungual debris and tenderness to dorsal palpation. Anonychia noted L hallux and R hallux. Nailbed(s) epithelialized.  Porokeratotic lesion(s) submet head 5 right foot. No erythema, no edema, no drainage, no flocculence.  Musculoskeletal: Normal muscle strength 5/5 to all lower extremity muscle groups bilaterally, no pain crepitus or joint limitation noted with ROM b/l and hammertoes noted to the  R 2nd toe.  Neurological: Protective sensation intact 5/5 intact bilaterally with 10g monofilament b/l Vibratory sensation absent b/l  Assessment: 1. Pain due to onychomycosis of toenails of both feet   2. Porokeratosis   3. Idiopathic peripheral neuropathy    Plan: -Toenails 2-5 bilaterally debrided in length and girth without iatrogenic bleeding with sterile nail nipper and dremel.  -Painful porokeratotic lesion(s) submet head 5 right foot pared and enucleated with sterile scalpel blade without incident. -Patient to continue soft, supportive shoe gear daily. -Patient to report any pedal injuries to medical professional immediately. -Patient/POA to call should there be question/concern in the interim.  Return in about 3 months (around 06/24/2020) for nail and callus trim.

## 2020-04-01 NOTE — Progress Notes (Signed)
Subjective: CC: Establish care, A. fib, chronic pain syndrome PCP: Dettinger, Fransisca Kaufmann, MD [id number] W Ost is a 79 y.o. female presenting to clinic today for:  1.  A. fib Patient reports compliance with Eliquis, diltiazem.  Denies any rectal, vaginal or urinary bleeding.  Does not report any change in exercise tolerance.  No chest pain, shortness of breath.  2.  MGUS Patient reports that she is to be followed by Dr. Burr Medico for this but she was released to PCP for ongoing monitoring of labs.  She has not had labs in a while would like to make sure we get these collected today.  3.  Chronic pain syndrome Patient reports being managed by pain clinic.  She is on a pain patch.  She is also treated with Lunesta for sleep.  Unfortunately, she had been out of this for several weeks which caused quite a bit of mood disturbance due to lack of sleep.  Things have normalized now.  Does not report any excessive daytime sedation, falls, change in mental status.  She is accompanied today by her daughter.  4.  Allergic rhinitis Patient reports that she started Allegra and this seems to be helping quite a bit with her allergies.  Does not report any rhinorrhea, cough, sneezing.   ROS: Per HPI  Allergies  Allergen Reactions  . Buprenorphine Hcl Itching  . Cymbalta [Duloxetine Hcl] Other (See Comments)    Other reaction(s): Other (See Comments) Personality changes - crying  2014  . Morphine And Related Itching  . Oxycodone-Acetaminophen Other (See Comments)    Personality changes   . Valium [Diazepam] Other (See Comments)    Personality changes - "in another world" ;  Hallucinations  2015  . Statins Other (See Comments)    Other reaction(s): Other (See Comments) Muscle weakness Muscle weakness  . Altace [Ramipril] Other (See Comments)    unknown  . Floxin [Ofloxacin] Other (See Comments)    unknown  . Hydromorphone Other (See Comments)    Other reaction(s): Confusion (intolerance) unknown   . Lovaza [Omega-3-Acid Ethyl Esters] Other (See Comments)    Muscle weakness   . Trilipix [Choline Fenofibrate] Other (See Comments)    Muscle weakness   . Zetia [Ezetimibe] Other (See Comments)    Muscle weakness    Past Medical History:  Diagnosis Date  . Abnormal nuclear cardiac imaging test   . Anemia   . Anemia, iron deficiency 07/02/2015  . Anxiety   . Arthritis    "knees, back, fingers, toes; joints" (01/07/2015)  . 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 CAD (coronary artery disease)    a. Abnl nuc 11/2014. Cath 12/2014 - turned down for CABG. Ultimately s/p TTVP, rotational atherectomy, PTCA and stenting of the ostial LCx and left main into the LAD (crush technique), and IVUS of the LAD/Left main. // b. Myoview 11/17: EF 48, poor quality/significant artifact; inf-lateral, inferior ischemia; Intermediate Risk  . Chronic atrial fibrillation ([place])    a.  First noted post-op 9/15 spinal fusion. She had cardioversion, not on anticoagulation. Fall risk, unsteady. // failed DCCV // Holter 10/17: AFib, Avg HR 97, PVCs, no other arrhythmia  . Chronic back pain greater than 3 months duration    a. spinal stenosis. Spinal fusion with rods in 2/15 at [facility] spinal fusion 9/15.  Marland Kitchen Chronic diastolic CHF (congestive heart failure) ([place])   . Circadian rhythm sleep disorder   . CTS (carpal tunnel syndrome)   . Deficiency anemia 11/10/2014  . Depression   .  Diarrhea   . Diverticulitis of colon   . Esophageal stricture   . Gastritis   . Gastroesophageal hernia 07/06/2013  . GERD (gastroesophageal reflux disease)   . Hiatal hernia   . History of blood transfusion    "most of them related to OR's"   . History of echocardiogram    a. Echo 2/17:  EF 50-55%, trivial AI, midl MR, mod LAE, PASP 37 mmHg  . HTN (hypertension)   . Hyperlipidemia   . IBS (irritable bowel syndrome)   . Incontinence 10/13/2012  . Insomnia   . Ischemic chest pain ([place])   . Memory disorder 12/04/2014  .  Metabolic syndrome 12/28/4079  . MGUS (monoclonal gammopathy of unknown significance) dx'd 11/2014   a. Neg BMB 11/2014.  . Multiple falls   . Obesity   . Obstructive sleep apnea    "have mask; don't wear it" (01/07/2015)  . Orthostasis   . PAT (paroxysmal atrial tachycardia) ([place])   . Personal history of colonic polyps 10/25/2011 & 12/02/11   not retrieved Dr Lyla Son & tubular adenomas  . Pneumonia 03/2014  . Sinus bradycardia    a. Baseline HR 50s-60s.  . Stroke [facility]) early 2000's   "small"; denies residual on 01/07/2015)    Current Outpatient Medications:  .  aspirin EC 81 MG tablet, Take 1 tablet (81 mg total) by mouth daily., Disp: 90 tablet, Rfl: 3 .  Calcium Citrate-Vitamin D (CALCIUM + D PO), Take 1 tablet by mouth daily., Disp: , Rfl:  .  Cholecalciferol (VITAMIN D) 2000 units tablet, Take 2,000 Units by mouth daily., Disp: , Rfl:  .  clobetasol cream (TEMOVATE) 4.48 %, Apply 1 application topically See admin instructions. Applies to vaginal area twice a week., Disp: , Rfl:  .  diclofenac (FLECTOR) 1.3 % PTCH, Flector 1.3 % transdermal 12 hour patch  Apply 1 patch twice a day by transdermal route., Disp: , Rfl:  .  diltiazem (CARDIZEM CD) 120 MG 24 hr capsule, Take 1 capsule (120 mg total) by mouth 2 (two) times daily., Disp: 180 capsule, Rfl: 3 .  diphenoxylate-atropine (LOMOTIL) 2.5-0.025 MG per tablet, Take 1 tablet by mouth 4 (four) times daily as needed for diarrhea or loose stools. , Disp: , Rfl:  .  ELIQUIS 5 MG TABS tablet, Take 1 tablet (5 mg total) by mouth 2 (two) times daily., Disp: 180 tablet, Rfl: 2 .  Eszopiclone 3 MG TABS, Take 1 tablet (3 mg total) by mouth at bedtime. For insomnia,take immediately before bedtime., Disp: 90 tablet, Rfl: 1 .  fentaNYL (DURAGESIC - DOSED MCG/HR) 50 MCG/HR, Place 1 patch (50 mcg total) onto the skin every 3 (three) days., Disp: 10 patch, Rfl: 0 .  FLUoxetine (PROZAC) 40 MG capsule, Take 1 capsule (40 mg total) by mouth daily., Disp:  90 capsule, Rfl: 3 .  furosemide (LASIX) 80 MG tablet, Take 1 tablet (80 mg total) by mouth daily., Disp: 90 tablet, Rfl: 3 .  HYDROcodone-acetaminophen (NORCO) 10-325 MG per tablet, Take 1 tablet by mouth every 4 (four) hours as needed for moderate pain. , Disp: , Rfl:  .  LORazepam (ATIVAN) 0.5 MG tablet, Takes as needed, Disp: , Rfl:  .  methocarbamol (ROBAXIN) 500 MG tablet, Take 500 mg by mouth 4 (four) times daily as needed for muscle spasms., Disp: , Rfl:  .  Multiple Vitamin (MULTIVITAMIN) tablet, Take 1 tablet by mouth daily. For supplement, Disp: , Rfl:  .  nitroGLYCERIN (NITROSTAT) 0.4 MG SL tablet,  Place 1 tablet (0.4 mg total) under the tongue every 5 (five) minutes as needed for chest pain (up to 3 doses)., Disp: 25 tablet, Rfl: 3 .  pantoprazole (PROTONIX) 40 MG tablet, Take 1 tablet (40 mg total) by mouth 2 (two) times daily., Disp: 180 tablet, Rfl: 3 .  potassium chloride SA (K-DUR) 20 MEQ tablet, Take 1 tablet (20 mEq total) by mouth daily., Disp: 90 tablet, Rfl: 3 .  rosuvastatin (CRESTOR) 10 MG tablet, Take 1 tablet (10 mg total) by mouth daily. (Patient taking differently: Take 10 mg by mouth daily. Alternates 10 mg and 5 mg), Disp: 90 tablet, Rfl: 0 Social History   Socioeconomic History  . Marital status: Widowed    Spouse name: Not on file  . Number of children: 2  . Years of education: HS  . Highest education level: Not on file  Occupational History  . Occupation: Licensed conveyancer- retired    Comment: retired  Tobacco Use  . Smoking status: Never Smoker  . Smokeless tobacco: Never Used  Substance and Sexual Activity  . Alcohol use: No    Comment: 01/07/2015 "glass of wine at Christmas, maybe"  . Drug use: No  . Sexual activity: Never  Other Topics Concern  . Not on file  Social History Narrative   Patient is right handed   Patient drinks 1-2 sodas daily.   patlient lives alone.   Social Determinants of Health   Financial Resource Strain:   . Difficulty of  Paying Living Expenses:   Food Insecurity:   . Worried About Charity fundraiser in the Last Year:   . Arboriculturist in the Last Year:   Transportation Needs:   . Film/video editor (Medical):   Marland Kitchen Lack of Transportation (Non-Medical):   Physical Activity:   . Days of Exercise per Week:   . Minutes of Exercise per Session:   Stress:   . Feeling of Stress :   Social Connections:   . Frequency of Communication with Friends and Family:   . Frequency of Social Gatherings with Friends and Family:   . Attends Religious Services:   . Active Member of Clubs or Organizations:   . Attends Archivist Meetings:   Marland Kitchen Marital Status:   Intimate Partner Violence:   . Fear of Current or Ex-Partner:   . Emotionally Abused:   Marland Kitchen Physically Abused:   . Sexually Abused:    Family History  Problem Relation Age of Onset  . Coronary artery disease Father   . Peripheral vascular disease Father   . Coronary artery disease Mother   . Coronary artery disease Brother   . Colon cancer Sister   . Emphysema Sister   . Sleep apnea Son   . Colon cancer Sister        spread to her brain  . Emphysema Brother   . Dementia Neg Hx     Objective: Office vital signs reviewed. BP 114/78   Pulse 100   Temp (!) 97 F (36.1 C)   Ht 5' 7"  (1.702 m)   Wt 187 lb (84.8 kg)   SpO2 95%   BMI 29.29 kg/m   Physical Examination:  General: Awake, alert, No acute distress HEENT: Normal, sclera white, MMM Cardio: irregularly irregular, S1S2 heard, no murmurs appreciated Pulm: clear to auscultation bilaterally, no wheezes, rhonchi or rales; normal work of breathing on room air Extremities: warm, well perfused, 1+ ankle edema, no cyanosis or clubbing; +2 pulses bilaterally MSK:  slow gait and hunched station; uses rolling walker for ambulation  Assessment/ Plan: 79 y.o. female   1. Permanent atrial fibrillation (HCC) Rate controlled.  Continue current regimen - CBC with Differential - Ferritin   2. Essential hypertension Controlled. - CMP14+EGFR  3. Chronic diastolic CHF (congestive heart failure) ([place]) She has +1 lower extremity edema.  I encouraged elevation of lower extremities, restriction of salt  4. Coronary artery disease involving native coronary artery of native heart without angina pectoris  5. MGUS (monoclonal gammopathy of unknown significance) We will check labs - CMP14+EGFR - CBC with Differential - Ferritin   No orders of the defined types were placed in this encounter.  No orders of the defined types were placed in this encounter.    [name], DO [place] [phone]

## 2020-04-02 ENCOUNTER — Encounter: Payer: Self-pay | Admitting: Physician Assistant

## 2020-04-02 ENCOUNTER — Ambulatory Visit: Payer: Medicare HMO | Admitting: Physician Assistant

## 2020-04-02 VITALS — BP 118/50 | HR 96 | Ht 67.0 in | Wt 187.0 lb

## 2020-04-02 DIAGNOSIS — I4821 Permanent atrial fibrillation: Secondary | ICD-10-CM

## 2020-04-02 DIAGNOSIS — I25119 Atherosclerotic heart disease of native coronary artery with unspecified angina pectoris: Secondary | ICD-10-CM | POA: Diagnosis not present

## 2020-04-02 DIAGNOSIS — I5033 Acute on chronic diastolic (congestive) heart failure: Secondary | ICD-10-CM | POA: Diagnosis not present

## 2020-04-02 DIAGNOSIS — R0602 Shortness of breath: Secondary | ICD-10-CM | POA: Diagnosis not present

## 2020-04-02 LAB — CMP14+EGFR
ALT: 18 IU/L (ref 0–32)
AST: 25 IU/L (ref 0–40)
Albumin/Globulin Ratio: 1.9 (ref 1.2–2.2)
Albumin: 4 g/dL (ref 3.7–4.7)
Alkaline Phosphatase: 103 IU/L (ref 39–117)
BUN/Creatinine Ratio: 14 (ref 12–28)
BUN: 12 mg/dL (ref 8–27)
Bilirubin Total: 0.4 mg/dL (ref 0.0–1.2)
CO2: 25 mmol/L (ref 20–29)
Calcium: 9.2 mg/dL (ref 8.7–10.3)
Chloride: 99 mmol/L (ref 96–106)
Creatinine, Ser: 0.87 mg/dL (ref 0.57–1.00)
GFR calc Af Amer: 73 mL/min/{1.73_m2} (ref >59)
GFR calc non Af Amer: 63 mL/min/{1.73_m2} (ref >59)
Globulin, Total: 2.1 g/dL (ref 1.5–4.5)
Glucose: 109 mg/dL — ABNORMAL HIGH (ref 65–99)
Potassium: 3.9 mmol/L (ref 3.5–5.2)
Sodium: 139 mmol/L (ref 134–144)
Total Protein: 6.1 g/dL (ref 6.0–8.5)

## 2020-04-02 LAB — CBC WITH DIFFERENTIAL/PLATELET
Basophils Absolute: 0 10*3/uL (ref 0.0–0.2)
Basos: 1 %
EOS (ABSOLUTE): 0.1 10*3/uL (ref 0.0–0.4)
Eos: 1 %
Hematocrit: 38.8 % (ref 34.0–46.6)
Hemoglobin: 12.9 g/dL (ref 11.1–15.9)
Immature Grans (Abs): 0 10*3/uL (ref 0.0–0.1)
Immature Granulocytes: 1 %
Lymphocytes Absolute: 1.7 10*3/uL (ref 0.7–3.1)
Lymphs: 27 %
MCH: 31.5 pg (ref 26.6–33.0)
MCHC: 33.2 g/dL (ref 31.5–35.7)
MCV: 95 fL (ref 79–97)
Monocytes Absolute: 0.5 10*3/uL (ref 0.1–0.9)
Monocytes: 8 %
Neutrophils Absolute: 3.9 10*3/uL (ref 1.4–7.0)
Neutrophils: 62 %
Platelets: 228 10*3/uL (ref 150–450)
RBC: 4.1 x10E6/uL (ref 3.77–5.28)
RDW: 12.7 % (ref 11.7–15.4)
WBC: 6.2 10*3/uL (ref 3.4–10.8)

## 2020-04-02 LAB — FERRITIN: Ferritin: 96 ng/mL (ref 15–150)

## 2020-04-02 NOTE — Progress Notes (Signed)
Cardiology Office Note:    Date:  04/02/2020   ID:  [name] [name], DOB May 24, 1941, MRN [id number]  PCP:  [name], DO  Cardiologist:  Sherren Mocha, MD   Electrophysiologist:  None   Referring MD: Dettinger, Fransisca Kaufmann, MD   Chief Complaint:  Follow-up (CAD, AFib, CHF) and Shortness of Breath    Patient Profile:    [name] [name] is a 79 y.o. female with:   Coronary artery disease ? Cath 12/15: Complex bifurcational distal LM/ostial LCx disease - poor CABG candidate ? PCI 12/15: DES to distal LM and ostial LCx ? Myoview 11/17: + Inferior ischemia ? Cath 12/17: Patent LM/LCx stent; no significant RCA disease  Diastolic CHF  Permanent atrial fibrillation ? S/p DCCV 1/17 >> ERAF  >> rate control strategy  Hypertension  Hyperlipidemia  S/p multiple back surgeries; managed with chronic narcotic pain medication  Hx of orthostatic hypotension; s/p multiple falls in the past  Hepatic steatosis (elevated LFTs)  MGUS  Prior CV studies: Echocardiogram 11/24/2018 EF 50-55, normal wall motion, trivial AI, trivial MR, severe LAE, moderate RAE, mild TR, PASP 35  ABIs 01/18/2018 Final Interpretation: Right: Resting right ankle-brachial index is within normal range. No evidence of significant right lower extremity arterial disease. The right toe-brachial index is normal.  Left: Resting left ankle-brachial index is within normal range. No evidence of significant left lower extremity arterial disease. The left toe-brachial index is normal.  Echocardiogram 12/22/2017 EF 55-60, normal wall motion, mild MAC, trivial MR, severe LAE, normal RV SF, mild TR, PASP 31  UE Vascular US 12/17 Patent right radial artery and veins, without evidence of fistula or pseudoaneurysm. Probable hematoma anterior to the distal right radial artery.  LHC 11/25/16 LM stent patent (stent extends from LM into pLAD) LAD irregs LCx stent patent with 25 RCA mid 40  Myoview 11/19/16 EF 48,  poor quality/significant artifact; inf-lateral, inferior ischemia; Intermediate Risk  Holter 09/10/16 The basic rhythm is atrial fibrillation with average HR 97 bpm There are occasional ventricular ectopics No other arrhythmia identified No bradycardic events  Holter 3/17 The baseline rhythm is atrial fibrillation The average ventricular rate is 96 bpm There are periods of rapid atrial fib and slow atrial fib with pauses up to 3 seconds There are single PVC's or aberrated beats with a rare ventricular couplet  Echo 01/29/16 EF 50-55%, trivial AI, mild MR, mod LAE, PASP 37 mmHg  LHC 1/16 LM dist ? 50% LAD prox 30% - FFR 0.82 LCx ostial 70% - FFR 0.86 >> 0.66 RCA mid 40% CABG deferred b/c of poor functional capacity [id number] bifurcational distal LM and ostial LCx with rotational atherectomy and IVUS guided with 2.75 x 12 mm Synergy DES to ostial LCx;4 x 16 mm Synergy DES to distal LM  Myoview 12/15 Intermediate risk stress nuclear study Moderate area of anteroapical and anteroseptal ischemia Also inferolateral wall infarct with moderate peri infarct ischemiaStudy suggests multi vessel diseae.LV Ejection Fraction: 57%  Echo 7/11 Mild LVH, EF 55-60%, no RWMA, mild MR  History of Present Illness:    [name] [name] was last seen in 09/2019.  She returns for follow up.  She is here with her daughter.  She has felt more short of breath the past few months. It seems to be getting worse.  She has not had chest pain.  She does note orthopnea, increasing lower extremity swelling.  Her weights have been stable.  She has not had syncope.  She has not had a  cough.        Past Medical History:  Diagnosis Date  . Abnormal nuclear cardiac imaging test   . Anemia   . Anemia, iron deficiency 07/02/2015  . Anxiety   . Arthritis    "knees, back, fingers, toes; joints" (01/07/2015)  . 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 CAD (coronary artery disease)    a. Abnl nuc 11/2014. Cath 12/2014 -  turned down for CABG. Ultimately s/p TTVP, rotational atherectomy, PTCA and stenting of the ostial LCx and left main into the LAD (crush technique), and IVUS of the LAD/Left main. // b. Myoview 11/17: EF 48, poor quality/significant artifact; inf-lateral, inferior ischemia; Intermediate Risk  . Chronic atrial fibrillation ([place])    a.  First noted post-op 9/15 spinal fusion. She had cardioversion, not on anticoagulation. Fall risk, unsteady. // failed DCCV // Holter 10/17: AFib, Avg HR 97, PVCs, no other arrhythmia  . Chronic back pain greater than 3 months duration    a. spinal stenosis. Spinal fusion with rods in 2/15 at [facility] fusion 9/15.  Marland Kitchen Chronic diastolic CHF (congestive heart failure) ([place])   . Circadian rhythm sleep disorder   . CTS (carpal tunnel syndrome)   . Deficiency anemia 11/10/2014  . Depression   . Diarrhea   . Diverticulitis of colon   . Esophageal stricture   . Gastritis   . Gastroesophageal hernia 07/06/2013  . GERD (gastroesophageal reflux disease)   . Hiatal hernia   . History of blood transfusion    "most of them related to OR's"   . History of echocardiogram    a. Echo 2/17:  EF 50-55%, trivial AI, midl MR, mod LAE, PASP 37 mmHg  . HTN (hypertension)   . Hyperlipidemia   . IBS (irritable bowel syndrome)   . Incontinence 10/13/2012  . Insomnia   . Ischemic chest pain ([place])   . Memory disorder 12/04/2014  . Metabolic syndrome 02/20/9923  . MGUS (monoclonal gammopathy of unknown significance) dx'd 11/2014   a. Neg BMB 11/2014.  . Multiple falls   . Obesity   . Obstructive sleep apnea    "have mask; don't wear it" (01/07/2015)  . Orthostasis   . PAT (paroxysmal atrial tachycardia) ([place])   . Personal history of colonic polyps 10/25/2011 & 12/02/11   not retrieved Dr Lyla Son & tubular adenomas  . Pneumonia 03/2014  . Sinus bradycardia    a. Baseline HR 50s-60s.  . Stroke [facility]) early 2000's   "small"; denies residual on 01/07/2015)     Current Medications: Current Meds  Medication Sig  . aspirin EC 81 MG tablet Take 1 tablet (81 mg total) by mouth daily.  . Calcium Citrate-Vitamin D (CALCIUM + D PO) Take 1 tablet by mouth daily.  . Cholecalciferol (VITAMIN D) 2000 units tablet Take 2,000 Units by mouth daily.  . clobetasol cream (TEMOVATE) 2.68 % Apply 1 application topically See admin instructions. Applies to vaginal area twice a week.  . diltiazem (CARDIZEM CD) 120 MG 24 hr capsule Take 1 capsule (120 mg total) by mouth 2 (two) times daily.  . diphenoxylate-atropine (LOMOTIL) 2.5-0.025 MG per tablet Take 1 tablet by mouth 4 (four) times daily as needed for diarrhea or loose stools.   Marland Kitchen ELIQUIS 5 MG TABS tablet Take 1 tablet (5 mg total) by mouth 2 (two) times daily.  . Eszopiclone 3 MG TABS Take 1 tablet (3 mg total) by mouth at bedtime. For insomnia,take immediately before bedtime.  . fentaNYL (DURAGESIC - DOSED  MCG/HR) 50 MCG/HR Place 1 patch (50 mcg total) onto the skin every 3 (three) days.  . Ferrous Sulfate (IRON PO) Take by mouth. Take one tablet by mouth daily (OTC Supplement)  . FLUoxetine (PROZAC) 40 MG capsule Take 1 capsule (40 mg total) by mouth daily.  . furosemide (LASIX) 80 MG tablet Take 1 tablet (80 mg total) by mouth daily.  Marland Kitchen HYDROcodone-acetaminophen (NORCO) 10-325 MG per tablet Take 1 tablet by mouth every 4 (four) hours as needed for moderate pain.   . methocarbamol (ROBAXIN) 500 MG tablet Take 500 mg by mouth 4 (four) times daily as needed for muscle spasms.  . Multiple Vitamin (MULTIVITAMIN) tablet Take 1 tablet by mouth daily. For supplement  . nitroGLYCERIN (NITROSTAT) 0.4 MG SL tablet Place 1 tablet (0.4 mg total) under the tongue every 5 (five) minutes as needed for chest pain (up to 3 doses).  . pantoprazole (PROTONIX) 40 MG tablet Take 1 tablet (40 mg total) by mouth 2 (two) times daily.  . potassium chloride SA (K-DUR) 20 MEQ tablet Take 1 tablet (20 mEq total) by mouth daily.  .  rosuvastatin (CRESTOR) 10 MG tablet Take 1 tablet (10 mg total) by mouth daily.     Allergies:   Buprenorphine hcl, Cymbalta [duloxetine hcl], Morphine and related, Oxycodone-acetaminophen, Valium [diazepam], Statins, Altace [ramipril], Floxin [ofloxacin], Hydromorphone, Lovaza [omega-3-acid ethyl esters], Trilipix [choline fenofibrate], and Zetia [ezetimibe]   Social History   Tobacco Use  . Smoking status: Never Smoker  . Smokeless tobacco: Never Used  Substance Use Topics  . Alcohol use: No    Comment: 01/07/2015 "glass of wine at Christmas, maybe"  . Drug use: No     Family Hx: The patient's family history includes Colon cancer in her sister and sister; Coronary artery disease in her brother, father, and mother; Emphysema in her brother and sister; Peripheral vascular disease in her father; Sleep apnea in her son. There is no history of Dementia.  ROS   EKGs/Labs/Other Test Reviewed:    EKG:  EKG is   ordered today.  The ekg ordered today demonstrates atrial fibrillation, HR 96, low voltage, LAD, non-specific ST-TW changes, QTc 480, no changes  Recent Labs: 07/20/2019: TSH 3.030 04/01/2020: ALT 18; BUN 12; Creatinine, Ser 0.87; Hemoglobin 12.9; Platelets 228; Potassium 3.9; Sodium 139   Recent Lipid Panel Lab Results  Component Value Date/Time   CHOL 138 07/20/2019 12:52 PM   CHOL 156 05/11/2013 03:36 PM   TRIG 123 07/20/2019 12:52 PM   TRIG 121 02/18/2016 03:04 PM   TRIG 239 (H) 05/11/2013 03:36 PM   HDL 61 07/20/2019 12:52 PM   HDL 57 02/18/2016 03:04 PM   HDL 43 05/11/2013 03:36 PM   CHOLHDL 2.3 07/20/2019 12:52 PM   LDLCALC 52 07/20/2019 12:52 PM   LDLCALC 50 08/21/2014 11:41 AM   LDLCALC 65 05/11/2013 03:36 PM    Physical Exam:    VS:  BP (!) 118/50   Pulse 96   Ht _0  (1.702 m)   Wt 187 lb (84.8 kg)   SpO2 90%   BMI 29.29 kg/m     Wt Readings from Last 3 Encounters:  04/02/20 187 lb (84.8 kg)  04/01/20 187 lb (84.8 kg)  11/29/19 192 lb 12.8 oz (87.5  kg)     Constitutional:      Appearance: Healthy appearance. Not in distress.  Neck:     Vascular: JVD elevated with 6 cm of water.  Pulmonary:     Breath  sounds: Bibasilar Rales (faint crackles ) present.  Cardiovascular:     Normal rate. Irregularly irregular rhythm. Normal S1. Normal S2.     Murmurs: There is no murmur.  Edema:    Pretibial: bilateral trace edema of the pretibial area. Abdominal:     Palpations: Abdomen is soft. There is no hepatomegaly.  Skin:    General: Skin is warm and dry.  Neurological:     General: No focal deficit present.     Mental Status: Alert and oriented to person, place and time.      ASSESSMENT & PLAN:    1. Acute on chronic diastolic CHF (congestive heart failure) ([place]) 2. Shortness of breath She has an EF 50-55 on echocardiogram in 12/19.  She is NYHA 3-3b.  She has evidence of volume excess on exam and her symptoms are c/w congestive heart failure.  Her O2 is somewhat lower than previous.  She does have rales on exam.  I suspect her shortness of breath is all related to volume excess in the setting of multifactorial dyspnea related to coronary artery disease, deconditioning, atrial fibrillation.    -Increase Furosemide to 80 mg in A and 40 mg in P x 3 days, then resume usual dose  -Increase K+ to 20 mEq twice daily x 3 days, then resume usual dose  -Arrange 2D echocardiogram to rule out LV dysfunction, pulmonary hypertension   -Arrange CXR given abnormal lung exam  -FU in 6 weeks; she knows to call if symptoms do no improve  3. Coronary artery disease involving native coronary artery of native heart with angina pectoris [facility] History of bifurcational PCI with DES to the distal left main and ostial LCx in January 2016.  Cardiac catheterization 2017 demonstrated patent stent and no significant disease elsewhere. She has been short of breath and she has evidence of volume excess.  She has not had chest pain.  Her ECG is unchanged.  At this  point, I do not think she needs ischemic testing.  Continue ASA, rosuvastatin.    4. Permanent atrial fibrillation ([facility]) Rate is controlled.  Labs from primary care were independently reviewed and interpreted.  04/01/2020: Hgb 12.9, Creatinine 0.87.  Renal function, hemoglobin normal.  Continue current dose of Apixaban.      Dispo:  Return in about 6 weeks (around 05/14/2020) for Close Follow Up, w/ Dr. Burt Knack, or Richardson Dopp, PA-C, in person.   Medication Adjustments/Labs and Tests Ordered: Current medicines are reviewed at length with the patient today.  Concerns regarding medicines are outlined above.  Tests Ordered: Orders Placed This Encounter  Procedures  . DG Chest 2 View  . EKG 12-Lead  . ECHOCARDIOGRAM COMPLETE   Medication Changes: No orders of the defined types were placed in this encounter.   Signed, Richardson Dopp, PA-C  04/02/2020 5:05 PM    [facility] [address] [place], [place], [place]  [address] Phone: [phone]; Fax: [phone]

## 2020-04-02 NOTE — Patient Instructions (Signed)
Medication Instructions:   Your physician has recommended you make the following change in your medication:   1) Increase Furosemide to 80 mg in the AM and 40 mg in the PM for 3 days, then resume normal dose. 2) Increase Potassium to 20 mEq, 1 tablet by mouth twice a day for 3 days, then resume normal dose.  *If you need a refill on your cardiac medications before your next appointment, please call your pharmacy*  Lab Work:  None ordered today  Testing/Procedures:  Your physician has requested that you have an echocardiogram. Echocardiography is a painless test that uses sound waves to create images of your heart. It provides your doctor with information about the size and shape of your heart and how well your heart's chambers and valves are working. This procedure takes approximately one hour. There are no restrictions for this procedure.  Chest X-ray Instructions:    1. You may have this done at the [facility], located in the        [place] on the 1st floor.    2. You do no have to have an appointment.    3. [place], [place] [address]        [phone]        Monday - Friday  8:00 am - 5:00 pm    Follow-Up: At [facility], you and your health needs are our priority.  As part of our continuing mission to provide you with exceptional heart care, we have created designated Provider Care Teams.  These Care Teams include your primary Cardiologist (physician) and Advanced Practice Providers (APPs -  Physician Assistants and Nurse Practitioners) who all work together to provide you with the care you need, when you need it.  We recommend signing up for the patient portal called "MyChart".  Sign up information is provided on this After Visit Summary.  MyChart is used to connect with patients for Virtual Visits (Telemedicine).  Patients are able to view lab/test results, encounter notes, upcoming appointments, etc.  Non-urgent  messages can be sent to your provider as well.   To learn more about what you can do with MyChart, go to [email or web address].    Your next appointment:   6 week(s)  The format for your next appointment:   In Person  Provider:   Richardson Dopp, PA-C   Other Instructions  Weigh yourself daily and record, call if your weight goes up 3 pounds in 1 day. Call if shortness of breath does not get better on increased dose of Furosemide or gets worse after you stop increased dose.   Two Gram Sodium Diet 2000 mg  What is Sodium? Sodium is a mineral found naturally in many foods. The most significant source of sodium in the diet is table salt, which is about 40% sodium.  Processed, convenience, and preserved foods also contain a large amount of sodium.  The body needs only 500 mg of sodium daily to function,  A normal diet provides more than enough sodium even if you do not use salt.  Why Limit Sodium? A build up of sodium in the body can cause thirst, increased blood pressure, shortness of breath, and water retention.  Decreasing sodium in the diet can reduce edema and risk of heart attack or stroke associated with high blood pressure.  Keep in mind that there are many other factors involved in these health problems.  Heredity, obesity,  lack of exercise, cigarette smoking, stress and what you eat all play a role.  General Guidelines:  Do not add salt at the table or in cooking.  One teaspoon of salt contains over 2 grams of sodium.  Read food labels  Avoid processed and convenience foods  Ask your dietitian before eating any foods not dicussed in the menu planning guidelines  Consult your physician if you wish to use a salt substitute or a sodium containing medication such as antacids.  Limit milk and milk products to 16 oz (2 cups) per day.  Shopping Hints:  READ LABELS!! "Dietetic" does not necessarily mean low sodium.  Salt and other sodium ingredients are often added to foods  during processing.   Menu Planning Guidelines Food Group Choose More Often Avoid  Beverages (see also the milk group All fruit juices, low-sodium, salt-free vegetables juices, low-sodium carbonated beverages Regular vegetable or tomato juices, commercially softened water used for drinking or cooking  Breads and Cereals Enriched white, wheat, rye and pumpernickel bread, hard rolls and dinner rolls; muffins, cornbread and waffles; most dry cereals, cooked cereal without added salt; unsalted crackers and breadsticks; low sodium or homemade bread crumbs Bread, rolls and crackers with salted tops; quick breads; instant hot cereals; pancakes; commercial bread stuffing; self-rising flower and biscuit mixes; regular bread crumbs or cracker crumbs  Desserts and Sweets Desserts and sweets mad with mild should be within allowance Instant pudding mixes and cake mixes  Fats Butter or margarine; vegetable oils; unsalted salad dressings, regular salad dressings limited to 1 Tbs; light, sour and heavy cream Regular salad dressings containing bacon fat, bacon bits, and salt pork; snack dips made with instant soup mixes or processed cheese; salted nuts  Fruits Most fresh, frozen and canned fruits Fruits processed with salt or sodium-containing ingredient (some dried fruits are processed with sodium sulfites        Vegetables Fresh, frozen vegetables and low- sodium canned vegetables Regular canned vegetables, sauerkraut, pickled vegetables, and others prepared in brine; frozen vegetables in sauces; vegetables seasoned with ham, bacon or salt pork  Condiments, Sauces, Miscellaneous  Salt substitute with physician's approval; pepper, herbs, spices; vinegar, lemon or lime juice; hot pepper sauce; garlic powder, onion powder, low sodium soy sauce (1 Tbs.); low sodium condiments (ketchup, chili sauce, mustard) in limited amounts (1 tsp.) fresh ground horseradish; unsalted tortilla chips, pretzels, potato chips, popcorn,  salsa (1/4 cup) Any seasoning made with salt including garlic salt, celery salt, onion salt, and seasoned salt; sea salt, rock salt, kosher salt; meat tenderizers; monosodium glutamate; mustard, regular soy sauce, barbecue, sauce, chili sauce, teriyaki sauce, steak sauce, Worcestershire sauce, and most flavored vinegars; canned gravy and mixes; regular condiments; salted snack foods, olives, picles, relish, horseradish sauce, catsup   Food preparation: Try these seasonings Meats:    Pork Sage, onion Serve with applesauce  Chicken Poultry seasoning, thyme, parsley Serve with cranberry sauce  Lamb Curry powder, rosemary, garlic, thyme Serve with mint sauce or jelly  Veal Marjoram, basil Serve with current jelly, cranberry sauce  Beef Pepper, bay leaf Serve with dry mustard, unsalted chive butter  Fish [place] leaf, dill Serve with unsalted lemon butter, unsalted parsley butter  Vegetables:    Asparagus Lemon juice   Broccoli Lemon juice   Carrots Mustard dressing parsley, mint, nutmeg, glazed with unsalted butter and sugar   Green beans Marjoram, lemon juice, nutmeg,dill seed   Tomatoes Basil, marjoram, onion   Spice /blend for [employer]" 4 tsp ground thyme  1 tsp ground sage 3 tsp ground rosemary 4 tsp ground marjoram   Test your knowledge 1. A product that says "Salt Free" may still contain sodium. True or False 2. Garlic Powder and Hot Pepper Sauce an be used as alternative seasonings.True or False 3. Processed foods have more sodium than fresh foods.  True or False 4. Canned Vegetables have less sodium than froze True or False  WAYS TO DECREASE YOUR SODIUM INTAKE 1. Avoid the use of added salt in cooking and at the table.  Table salt (and other prepared seasonings which contain salt) is probably one of the greatest sources of sodium in the diet.  Unsalted foods can gain flavor from the sweet, sour, and butter taste sensations of herbs and spices.  Instead of using salt for seasoning, try  the following seasonings with the foods listed.  Remember: how you use them to enhance natural food flavors is limited only by your creativity... Allspice-Meat, fish, eggs, fruit, peas, red and yellow vegetables Almond Extract-Fruit baked goods Anise Seed-Sweet breads, fruit, carrots, beets, cottage cheese, cookies (tastes like licorice) Basil-Meat, fish, eggs, vegetables, rice, vegetables salads, soups, sauces Bay Leaf-Meat, fish, stews, poultry Burnet-Salad, vegetables (cucumber-like flavor) Caraway Seed-Bread, cookies, cottage cheese, meat, vegetables, cheese, rice Cardamon-Baked goods, fruit, soups Celery Powder or seed-Salads, salad dressings, sauces, meatloaf, soup, bread.Do not use  celery salt Chervil-Meats, salads, fish, eggs, vegetables, cottage cheese (parsley-like flavor) Chili Power-Meatloaf, chicken cheese, corn, eggplant, egg dishes Chives-Salads cottage cheese, egg dishes, soups, vegetables, sauces Cilantro-Salsa, casseroles Cinnamon-Baked goods, fruit, pork, lamb, chicken, carrots Cloves-Fruit, baked goods, fish, pot roast, green beans, beets, carrots Coriander-Pastry, cookies, meat, salads, cheese (lemon-orange flavor) Cumin-Meatloaf, fish,cheese, eggs, cabbage,fruit pie (caraway flavor) [employer], fruit, eggs, fish, poultry, cottage cheese, vegetables Dill Seed-Meat, cottage cheese, poultry, vegetables, fish, salads, bread Fennel Seed-Bread, cookies, apples, pork, eggs, fish, beets, cabbage, cheese, Licorice-like flavor Garlic-(buds or powder) Salads, meat, poultry, fish, bread, butter, vegetables, potatoes.Do not  use garlic salt Ginger-Fruit, vegetables, baked goods, meat, fish, poultry Horseradish Root-Meet, vegetables, butter Lemon Juice or Extract-Vegetables, fruit, tea, baked goods, fish salads Mace-Baked goods fruit, vegetables, fish, poultry (taste like nutmeg) Maple Extract-Syrups Marjoram-Meat, chicken, fish, vegetables, breads, green salads (taste like  Sage) Mint-Tea, lamb, sherbet, vegetables, desserts, carrots, cabbage Mustard, Dry or Seed-Cheese, eggs, meats, vegetables, poultry Nutmeg-Baked goods, fruit, chicken, eggs, vegetables, desserts Onion Powder-Meat, fish, poultry, vegetables, cheese, eggs, bread, rice salads (Do not use   Onion salt) Orange Extract-Desserts, baked goods Oregano-Pasta, eggs, cheese, onions, pork, lamb, fish, chicken, vegetables, green salads Paprika-Meat, fish, poultry, eggs, cheese, vegetables Parsley Flakes-Butter, vegetables, meat fish, poultry, eggs, bread, salads (certain forms may   Contain sodium Pepper-Meat fish, poultry, vegetables, eggs Peppermint Extract-Desserts, baked goods Poppy Seed-Eggs, bread, cheese, fruit dressings, baked goods, noodles, vegetables, cottage  [employer], poultry, meat, fish, cauliflower, turnips,eggs bread Saffron-Rice, bread, veal, chicken, fish, eggs Sage-Meat, fish, poultry, onions, eggplant, tomateos, pork, stews Savory-Eggs, salads, poultry, meat, rice, vegetables, soups, pork Tarragon-Meat, poultry, fish, eggs, butter, vegetables (licorice-like flavor)  Thyme-Meat, poultry, fish, eggs, vegetables, (clover-like flavor), sauces, soups Tumeric-Salads, butter, eggs, fish, rice, vegetables (saffron-like flavor) Vanilla Extract-Baked goods, candy Vinegar-Salads, vegetables, meat marinades Walnut Extract-baked goods, candy  2. Choose your Foods Wisely   The following is a list of foods to avoid which are high in sodium:  Meats-Avoid all smoked, canned, salt cured, dried and kosher meat and fish as well as Anchovies   [employer] meats:Bologna, Liverwurst, Pastrami Canned meat  or fish  Marinated herring Caviar    Pepperoni Corned Beef   Pizza Dried chipped beef  Salami Frozen breaded fish or meat Salt pork Frankfurters or hot dogs  Sardines Gefilte fish   Sausage Ham (boiled ham, Proscuitto Smoked butt    spiced  ham)   Spam      TV Dinners Vegetables Canned vegetables (Regular) Relish Canned mushrooms  Sauerkraut Olives    Tomato juice Pickles  Bakery and Dessert Products Canned puddings  Cream pies Cheesecake   Decorated cakes Cookies  Beverages/Juices Tomato juice, regular  Gatorade   V-8 vegetable juice, regular  Breads and Cereals Biscuit mixes   Salted potato chips, corn chips, pretzels Bread stuffing mixes  Salted crackers and rolls Pancake and waffle mixes Self-rising flour  Seasonings Accent    Meat sauces Barbecue sauce  Meat tenderizer Catsup    Monosodium glutamate (MSG) Celery salt   Onion salt Chili sauce   Prepared mustard Garlic salt   Salt, seasoned salt, sea salt Gravy mixes   Soy sauce Horseradish   Steak sauce Ketchup   Tartar sauce Lite salt    Teriyaki sauce Marinade mixes   Worcestershire sauce  Others Baking powder   Cocoa and cocoa mixes Baking soda   Commercial casserole mixes Candy-caramels, chocolate  Dehydrated soups    Bars, fudge,nougats  Instant rice and pasta mixes Canned broth or soup  Maraschino cherries Cheese, aged and processed cheese and cheese spreads  Learning Assessment Quiz  Indicated T (for True) or F (for False) for each of the following statements:  1. _____ Fresh fruits and vegetables and unprocessed grains are generally low in sodium 2. _____ Water may contain a considerable amount of sodium, depending on the source 3. _____ You can always tell if a food is high in sodium by tasting it 4. _____ Certain laxatives my be high in sodium and should be avoided unless prescribed   by a physician or pharmacist 5. _____ Salt substitutes may be used freely by anyone on a sodium restricted diet 6. _____ Sodium is present in table salt, food additives and as a natural component of   most foods 7. _____ Table salt is approximately 90% sodium 8. _____ Limiting sodium intake may help prevent excess fluid accumulation in the  body 9. _____ On a sodium-restricted diet, seasonings such as bouillon soy sauce, and    cooking wine should be used in place of table salt 10. _____ On an ingredient list, a product which lists monosodium glutamate as the first   ingredient is an appropriate food to include on a low sodium diet  Circle the best answer(s) to the following statements (Hint: there may be more than one correct answer)  11. On a low-sodium diet, some acceptable snack items are:    A. Olives  F. Bean dip   K. Grapefruit juice    B. Salted Pretzels G. Commercial Popcorn   L. Canned peaches    C. Carrot Sticks  H. Bouillon   M. Unsalted nuts   D. [place] fries  I. Peanut butter crackers N. Salami   E. Sweet pickles J. Tomato Juice   O. Pizza  12.  Seasonings that may be used freely on a reduced - sodium diet include   A. Lemon wedges F.Monosodium glutamate K. Celery seed    B.Soysauce   G. Pepper   L. Mustard powder   C. Sea salt  H. Cooking wine  M. Onion flakes   D.  Vinegar  E. Prepared horseradish N. Salsa   E. Sage   J. Worcestershire sauce  O. Chutney

## 2020-04-03 ENCOUNTER — Other Ambulatory Visit: Payer: Self-pay

## 2020-04-03 ENCOUNTER — Other Ambulatory Visit: Payer: Self-pay | Admitting: Family Medicine

## 2020-04-03 ENCOUNTER — Ambulatory Visit (INDEPENDENT_AMBULATORY_CARE_PROVIDER_SITE_OTHER): Payer: Medicare HMO

## 2020-04-03 DIAGNOSIS — R0989 Other specified symptoms and signs involving the circulatory and respiratory systems: Secondary | ICD-10-CM | POA: Diagnosis not present

## 2020-04-04 ENCOUNTER — Ambulatory Visit (INDEPENDENT_AMBULATORY_CARE_PROVIDER_SITE_OTHER): Payer: Medicare HMO | Admitting: *Deleted

## 2020-04-04 ENCOUNTER — Telehealth: Payer: Self-pay

## 2020-04-04 DIAGNOSIS — Z0000 Encounter for general adult medical examination without abnormal findings: Secondary | ICD-10-CM

## 2020-04-04 NOTE — Progress Notes (Signed)
MEDICARE ANNUAL WELLNESS VISIT  04/04/2020  Telephone Visit Disclaimer This Medicare AWV was conducted by telephone due to national recommendations for restrictions regarding the COVID-19 Pandemic (e.g. social distancing).  I verified, using two identifiers, that I am speaking with [name] [name] or their authorized healthcare agent. I discussed the limitations, risks, security, and privacy concerns of performing an evaluation and management service by telephone and the potential availability of an in-person appointment in the future. The patient expressed understanding and agreed to proceed.   Subjective:  [name] [name] is a 79 y.o. female patient of [name] Norlander, DO who had a Medicare Annual Wellness Visit today via telephone. [name] [name] is Disabled and lives alone. she has 2 children. she reports that she is not socially active and does not interact with friends/family regularly. she is not physically active and enjoys puzzlePatient Care Team: [name] Norlander, DO as PCP - General (Family Medicine) Sherren Mocha, MD as PCP - Cardiology (Cardiology) Suella Broad, MD (Physical Medicine and Rehabilitation) Hillary Bow, MD (Cardiology) Newton Pigg, MD (Obstetrics and Gynecology)  Advanced Directives 04/04/2020 07/22/2017 01/01/2017 11/25/2016 07/02/2016 02/13/2016 12/03/2015  Does Patient Have a Medical Advance Directive? No No No No No No No  Type of Advance Directive - - - - - - -  Does patient want to make changes to medical advance directive? - - - - - - -  Copy of [place] in Chart? - - - - - - -  Would patient like information on creating a medical advance directive? No - Patient declined - - - No - patient declined information No - patient declined information -    Hospital Utilization Over the Past 12 Months: # of hospitalizations or ER visits: 0 # of surgeries: 0  Review of Systems    Patient reports that her overall health is worse  compared to last year.  History obtained from the patient  Patient Reported Readings (BP, Pulse, CBG, Weight, etc) none  Pain Assessment Pain : 0-10 Pain Score: 7  Pain Type: Chronic pain Pain Location: Back Pain Orientation: Upper Pain Descriptors / Indicators: Constant, Pins and needles Pain Onset: Other (comment)(since 2015) Pain Frequency: Constant Effect of Pain on Daily Activities: causes her to be very sedintary     Current Medications & Allergies (verified) Allergies as of 04/04/2020      Reactions   Buprenorphine Hcl Itching   Cymbalta [duloxetine Hcl] Other (See Comments)   Other reaction(s): Other (See Comments) Personality changes - crying  2014   Morphine And Related Itching   Oxycodone-acetaminophen Other (See Comments)   Personality changes    Valium [diazepam] Other (See Comments)   Personality changes - "in another world" ;  Hallucinations  2015   Statins Other (See Comments)   Other reaction(s): Other (See Comments) Muscle weakness Muscle weakness   Altace [ramipril] Other (See Comments)   unknown   Floxin [ofloxacin] Other (See Comments)   unknown   Hydromorphone Other (See Comments)   Other reaction(s): Confusion (intolerance) unknown   Lovaza [omega-3-acid Ethyl Esters] Other (See Comments)   Muscle weakness    Trilipix [choline Fenofibrate] Other (See Comments)   Muscle weakness    Zetia [ezetimibe] Other (See Comments)   Muscle weakness       Medication List       Accurate as of April 04, 2020  3:02 PM. If you have any questions, ask your nurse or doctor.  aspirin EC 81 MG tablet Take 1 tablet (81 mg total) by mouth daily.   CALCIUM + D PO Take 1 tablet by mouth daily.   clobetasol cream 0.05 % Commonly known as: TEMOVATE Apply 1 application topically See admin instructions. Applies to vaginal area twice a week.   diltiazem 120 MG 24 hr capsule Commonly known as: CARDIZEM CD Take 1 capsule (120 mg total) by mouth 2  (two) times daily.   diphenoxylate-atropine 2.5-0.025 MG tablet Commonly known as: LOMOTIL Take 1 tablet by mouth 4 (four) times daily as needed for diarrhea or loose stools.   Eliquis 5 MG Tabs tablet Generic drug: apixaban Take 1 tablet (5 mg total) by mouth 2 (two) times daily.   Eszopiclone 3 MG Tabs Take 1 tablet (3 mg total) by mouth at bedtime. For insomnia,take immediately before bedtime.   fentaNYL 50 MCG/HR Commonly known as: DURAGESIC Place 1 patch (50 mcg total) onto the skin every 3 (three) days.   FLUoxetine 40 MG capsule Commonly known as: PROZAC Take 1 capsule (40 mg total) by mouth daily.   furosemide 80 MG tablet Commonly known as: LASIX Take 1 tablet (80 mg total) by mouth daily.   HYDROcodone-acetaminophen 10-325 MG tablet Commonly known as: NORCO Take 1 tablet by mouth every 4 (four) hours as needed for moderate pain.   IRON PO Take by mouth. Take one tablet by mouth daily (OTC Supplement)   methocarbamol 500 MG tablet Commonly known as: ROBAXIN Take 500 mg by mouth 4 (four) times daily as needed for muscle spasms.   multivitamin tablet Take 1 tablet by mouth daily. For supplement   nitroGLYCERIN 0.4 MG SL tablet Commonly known as: Nitrostat Place 1 tablet (0.4 mg total) under the tongue every 5 (five) minutes as needed for chest pain (up to 3 doses).   pantoprazole 40 MG tablet Commonly known as: PROTONIX Take 1 tablet (40 mg total) by mouth 2 (two) times daily.   potassium chloride SA 20 MEQ tablet Commonly known as: KLOR-CON Take 1 tablet (20 mEq total) by mouth daily.   rosuvastatin 10 MG tablet Commonly known as: CRESTOR Take 1 tablet (10 mg total) by mouth daily.   Vitamin D 50 MCG (2000 UT) tablet Take 2,000 Units by mouth daily.       History (reviewed): Past Medical History:  Diagnosis Date  . Abnormal nuclear cardiac imaging test   . Anemia   . Anemia, iron deficiency 07/02/2015  . Anxiety   . Arthritis    "knees,  back, fingers, toes; joints" (01/07/2015)  . 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 CAD (coronary artery disease)    a. Abnl nuc 11/2014. Cath 12/2014 - turned down for CABG. Ultimately s/p TTVP, rotational atherectomy, PTCA and stenting of the ostial LCx and left main into the LAD (crush technique), and IVUS of the LAD/Left main. // b. Myoview 11/17: EF 48, poor quality/significant artifact; inf-lateral, inferior ischemia; Intermediate Risk  . Chronic atrial fibrillation ([place])    a.  First noted post-op 9/15 spinal fusion. She had cardioversion, not on anticoagulation. Fall risk, unsteady. // failed DCCV // Holter 10/17: AFib, Avg HR 97, PVCs, no other arrhythmia  . Chronic back pain greater than 3 months duration    a. spinal stenosis. Spinal fusion with rods in 2/15 at [facility] fusion 9/15.  Marland Kitchen Chronic diastolic CHF (congestive heart failure) ([place])   . Circadian rhythm sleep disorder   . CTS (carpal tunnel syndrome)   . Deficiency anemia  11/10/2014  . Depression   . Diarrhea   . Diverticulitis of colon   . Esophageal stricture   . Gastritis   . Gastroesophageal hernia 07/06/2013  . GERD (gastroesophageal reflux disease)   . Hiatal hernia   . History of blood transfusion    "most of them related to OR's"   . History of echocardiogram    a. Echo 2/17:  EF 50-55%, trivial AI, midl MR, mod LAE, PASP 37 mmHg  . HTN (hypertension)   . Hyperlipidemia   . IBS (irritable bowel syndrome)   . Incontinence 10/13/2012  . Insomnia   . Ischemic chest pain ([place])   . Memory disorder 12/04/2014  . Metabolic syndrome 05/27/5448  . MGUS (monoclonal gammopathy of unknown significance) dx'd 11/2014   a. Neg BMB 11/2014.  . Multiple falls   . Obesity   . Obstructive sleep apnea    "have mask; don't wear it" (01/07/2015)  . Orthostasis   . PAT (paroxysmal atrial tachycardia) ([place])   . Personal history of colonic polyps 10/25/2011 & 12/02/11   not retrieved Dr Lyla Son & tubular adenomas    . Pneumonia 03/2014  . Sinus bradycardia    a. Baseline HR 50s-60s.  . Stroke [facility]) early 2000's   "small"; denies residual on 01/07/2015)   Past Surgical History:  Procedure Laterality Date  . BACK SURGERY    . BONE MARROW BIOPSY  11/2014  . CARDIAC CATHETERIZATION  12/27/2014   Procedure: INTRAVASCULAR PRESSURE WIRE/FFR STUDY;  Surgeon: Larey Dresser, MD;  Location: [facility] CATH LAB;  Service: Cardiovascular;;  . CARDIAC CATHETERIZATION N/A 11/25/2016   Procedure: Left Heart Cath and Coronary Angiography;  Surgeon: Sherren Mocha, MD;  Location: [facility] CV LAB;  Service: Cardiovascular;  Laterality: N/A;  . CARDIOVERSION N/A 02/13/2016   Procedure: CARDIOVERSION;  Surgeon: Josue Hector, MD;  Location: [facility] ENDOSCOPY;  Service: Cardiovascular;  Laterality: N/A;  . CARPAL TUNNEL RELEASE Right 1980's  . CATARACT EXTRACTION Bilateral   . CATARACT EXTRACTION W/ INTRAOCULAR LENS  IMPLANT, BILATERAL  2000's  . CORONARY ANGIOPLASTY WITH STENT PLACEMENT  01/07/2015   "2"  . CORONARY STENT PLACEMENT    . DILATION AND CURETTAGE OF UTERUS    . ESOPHAGOGASTRODUODENOSCOPY (EGD) WITH ESOPHAGEAL DILATION  "several times"  . JOINT REPLACEMENT    . KNEE ARTHROSCOPY Left 1995  . LAPAROSCOPIC CHOLECYSTECTOMY  2003  . LEFT HEART CATHETERIZATION WITH CORONARY ANGIOGRAM N/A 12/27/2014   Procedure: LEFT HEART CATHETERIZATION WITH CORONARY ANGIOGRAM;  Surgeon: Larey Dresser, MD;  Location: [facility] CATH LAB;  Service: Cardiovascular;  Laterality: N/A;  . PERCUTANEOUS CORONARY ROTOBLATOR INTERVENTION (PCI-R)  01/07/2015  . PERCUTANEOUS CORONARY ROTOBLATOR INTERVENTION (PCI-R) N/A 01/07/2015   Procedure: PERCUTANEOUS CORONARY ROTOBLATOR INTERVENTION (PCI-R);  Surgeon: Blane Ohara, MD;  Location: [facility] CATH LAB;  Service: Cardiovascular;  Laterality: N/A;  . POSTERIOR FUSION THORACIC SPINE  08/2015  . POSTERIOR LUMBAR FUSION  01/2015  . TOTAL KNEE ARTHROPLASTY Bilateral 1990's - 2000's   Family History  Problem  Relation Age of Onset  . Coronary artery disease Father   . Peripheral vascular disease Father   . Coronary artery disease Mother   . Coronary artery disease Brother   . Colon cancer Sister   . Emphysema Sister   . Sleep apnea Son   . Colon cancer Sister        spread to her brain  . Emphysema Brother   . Dementia Neg Hx    Social History  Socioeconomic History  . Marital status: Widowed    Spouse name: Not on file  . Number of children: 2  . Years of education: HS  . Highest education level: Not on file  Occupational History  . Occupation: Licensed conveyancer- retired    Comment: retired  Tobacco Use  . Smoking status: Never Smoker  . Smokeless tobacco: Never Used  Substance and Sexual Activity  . Alcohol use: No    Comment: 01/07/2015 "glass of wine at Christmas, maybe"  . Drug use: No  . Sexual activity: Never  Other Topics Concern  . Not on file  Social History Narrative   Patient is right handed   Patient drinks 1-2 sodas daily.   patlient lives alone.   Social Determinants of Health   Financial Resource Strain:   . Difficulty of Paying Living Expenses:   Food Insecurity:   . Worried About Charity fundraiser in the Last Year:   . Arboriculturist in the Last Year:   Transportation Needs:   . Film/video editor (Medical):   Marland Kitchen Lack of Transportation (Non-Medical):   Physical Activity: Inactive  . Days of Exercise per Week: 0 days  . Minutes of Exercise per Session: 0 min  Stress: Stress Concern Present  . Feeling of Stress : To some extent  Social Connections: Moderately Isolated  . Frequency of Communication with Friends and Family: Three times a week  . Frequency of Social Gatherings with Friends and Family: Once a week  . Attends Religious Services: Never  . Active Member of Clubs or Organizations: No  . Attends Archivist Meetings: Never  . Marital Status: Widowed    Activities of Daily Living In your present state of health, do you have any  difficulty performing the following activities: 04/04/2020 04/04/2020  Hearing? Y Y  Comment trouble hearing TV and in groups -  Vision? N N  Difficulty concentrating or making decisions? Tempie Donning  Walking or climbing stairs? Y Y  Dressing or bathing? N N  Doing errands, shopping? Tempie Donning  Comment must have someone to drive and assist -  Preparing Food and eating ? Tempie Donning  Comment can not stand long enough to cook -  Using the Toilet? N N  In the past six months, have you accidently leaked urine? Y N  Do you have problems with loss of bowel control? N N  Managing your Medications? Tempie Donning  Comment daughter fills pill box -  Managing your Finances? N N  Housekeeping or managing your Housekeeping? Tempie Donning  Comment someone cleans her home -  Some recent data might be hidden    Patient Education/ Literacy How often do you need to have someone help you when you read instructions, pamphlets, or other written materials from your doctor or pharmacy?: 1 - Never What is the last grade level you completed in school?: 12  Exercise Current Exercise Habits: The patient does not participate in regular exercise at present, Exercise limited by: orthopedic condition(s)  Diet Patient reports consuming 1 meals a day and 2 snack(s) a day Patient reports that her primary diet is: Regular Patient reports that she does have regular access to food.   Depression Screen PHQ 2/9 Scores 04/04/2020 04/01/2020 11/29/2019 07/20/2019 05/02/2019 03/08/2019 11/02/2018  PHQ - 2 Score 3 3 1 5 1 1 1   PHQ- 9 Score 10 9 - 12 3 - -     Fall Risk Fall Risk  04/04/2020 04/01/2020 11/29/2019  07/20/2019 05/02/2019  Falls in the past year? 1 0 0 0 0  Number falls in past yr: 0 - - - -  Injury with Fall? 0 - - - -  Comment - - - - -  Risk for fall due to : History of fall(s) - - - -     Objective:  Kayleigh W Huckeby seemed alert and oriented and she participated appropriately during our telephone visit.  Blood Pressure Weight BMI  BP Readings  from Last 3 Encounters:  04/02/20 (!) 118/50  04/01/20 114/78  11/29/19 106/69   Wt Readings from Last 3 Encounters:  04/02/20 187 lb (84.8 kg)  04/01/20 187 lb (84.8 kg)  11/29/19 192 lb 12.8 oz (87.5 kg)   BMI Readings from Last 1 Encounters:  04/02/20 29.29 kg/m    *Unable to obtain current vital signs, weight, and BMI due to telephone visit type  Hearing/Vision  . Najat did not seem to have difficulty with hearing/understanding during the telephone conversation . Reports that she has had a formal eye exam by an eye care professional within the past year . Reports that she has not had a formal hearing evaluation within the past year *Unable to fully assess hearing and vision during telephone visit type  Cognitive Function: 6CIT Screen 04/04/2020  What Year? 0 points  What month? 0 points   (Normal:0-7, Significant for Dysfunction: >8)  Normal Cognitive Function Screening: Yes   Immunization & Health Maintenance Record Immunization History  Administered Date(s) Administered  . Fluad Quad(high Dose 65+) 10/04/2019  . Influenza, High Dose Seasonal PF 10/27/2016, 10/06/2017, 10/03/2018  . Influenza,inj,Quad PF,6+ Mos 10/16/2013, 10/11/2015  . Influenza,inj,quad, With Preservative 09/20/2018  . Influenza-Unspecified 09/26/2014  . PFIZER SARS-COV-2 Vaccination 02/01/2020, 02/26/2020  . PPD Test 02/12/2014  . Pneumococcal Conjugate-13 12/20/2013  . Tdap 09/02/2011  . Zoster 04/13/2012    Health Maintenance  Topic Date Due  . COLONOSCOPY  07/22/2019  . PNA vac Low Risk Adult (2 of 2 - PPSV23) 11/28/2020 (Originally 12/20/2014)  . PAP SMEAR-Modifier  05/10/2024 (Originally 03/06/2017)  . INFLUENZA VACCINE  07/21/2020  . MAMMOGRAM  01/01/2021  . TETANUS/TDAP  04/14/2023  . DEXA SCAN  Completed       Assessment  This is a routine wellness examination for Rainna W Kuzniar.  Health Maintenance: Due or Overdue Health Maintenance Due  Topic Date Due  . COLONOSCOPY   07/22/2019    Katesha W Miyazaki does not need a referral for Community Assistance: Care Management:   no Social Work:    no Prescription Assistance:  no Nutrition/Diabetes Education:  no   Plan:  Personalized Goals Goals Addressed            This Visit's Progress   . DIET - EAT MORE FRUITS AND VEGETABLES      . Prevent falls        Personalized Health Maintenance & Screening Recommendations  Colorectal cancer screening  Lung Cancer Screening Recommended: no (Low Dose CT Chest recommended if Age 74-80 years, 30 pack-year currently smoking OR have quit w/in past 15 years) Hepatitis C Screening recommended: no HIV Screening recommended: no  Advanced Directives: Written information was not prepared per patient's request.  Referrals & Orders No orders of the defined types were placed in this encounter.   Follow-up Plan . Follow-up with [name] Norlander, DO as planned on 10/10/20 . Pt is due for colonoscopy. . Pt is mostly house bound except for doctor visits. She does manage her  bathing, dressing, toileting and food prep on her own.She must have help with medications, cleaning and running errands. . Pt declined information on advanced directives. . Goals are to prevent falls and eat more healthy. . AVS printed and pt declined needing a copy.    I have personally reviewed and noted the following in the patient's chart:   . Medical and social history . Use of alcohol, tobacco or illicit drugs  . Current medications and supplements . Functional ability and status . Nutritional status . Physical activity . Advanced directives . List of other physicians . Hospitalizations, surgeries, and ER visits in previous 12 months . Vitals . Screenings to include cognitive, depression, and falls . Referrals and appointments  In addition, I have reviewed and discussed with Diannia W Albanese certain preventive protocols, quality metrics, and best practice recommendations. A written  personalized care plan for preventive services as well as general preventive health recommendations is available and can be mailed to the patient at her request.      Massiah Minjares, Ventura Bruns. LPN  5/61/2548

## 2020-04-04 NOTE — Telephone Encounter (Signed)
error 

## 2020-04-15 ENCOUNTER — Other Ambulatory Visit: Payer: Self-pay

## 2020-04-15 ENCOUNTER — Ambulatory Visit (HOSPITAL_COMMUNITY): Payer: Medicare HMO | Attending: Internal Medicine

## 2020-04-15 DIAGNOSIS — R0602 Shortness of breath: Secondary | ICD-10-CM | POA: Diagnosis not present

## 2020-04-15 DIAGNOSIS — I5033 Acute on chronic diastolic (congestive) heart failure: Secondary | ICD-10-CM | POA: Diagnosis not present

## 2020-04-16 ENCOUNTER — Encounter: Payer: Self-pay | Admitting: Physician Assistant

## 2020-04-24 ENCOUNTER — Ambulatory Visit: Payer: Medicare HMO | Admitting: Podiatry

## 2020-04-25 ENCOUNTER — Other Ambulatory Visit: Payer: Self-pay | Admitting: Family Medicine

## 2020-04-26 MED ORDER — ELIQUIS 5 MG PO TABS: 5.0000 mg | ORAL_TABLET | Freq: Two times a day (BID) | ORAL | 2 refills | Status: DC

## 2020-04-26 NOTE — Addendum Note (Signed)
Addended by: Wardell Heath on: 04/26/2020 08:36 AM   Modules accepted: Orders

## 2020-05-06 ENCOUNTER — Other Ambulatory Visit: Payer: Self-pay

## 2020-05-06 ENCOUNTER — Telehealth: Payer: Self-pay | Admitting: Family Medicine

## 2020-05-06 ENCOUNTER — Ambulatory Visit: Payer: Medicare HMO | Attending: Chiropractic Medicine | Admitting: Physical Therapy

## 2020-05-06 ENCOUNTER — Encounter: Payer: Self-pay | Admitting: Physical Therapy

## 2020-05-06 DIAGNOSIS — M6281 Muscle weakness (generalized): Secondary | ICD-10-CM | POA: Diagnosis present

## 2020-05-06 DIAGNOSIS — M545 Low back pain, unspecified: Secondary | ICD-10-CM

## 2020-05-06 DIAGNOSIS — R262 Difficulty in walking, not elsewhere classified: Secondary | ICD-10-CM

## 2020-05-06 DIAGNOSIS — G8929 Other chronic pain: Secondary | ICD-10-CM | POA: Diagnosis present

## 2020-05-06 NOTE — Telephone Encounter (Signed)
There should be refills on file- patient needs to call her pharmacy.  LMTCB

## 2020-05-06 NOTE — Therapy (Signed)
[facility] [place], [place], [address] Phone: [phone]   Fax:  [phone]  Physical Therapy Evaluation  Patient Details  Name: [name] [name] MRN: [id number] Date of Birth: 15-Nov-1941 Referring Provider (PT): Levy Pupa, [place]   Encounter Date: 05/06/2020  PT End of Session - 05/06/20 1859    Visit Number  1    Number of Visits  12    Date for PT Re-Evaluation  06/17/20    Authorization Type  Progress note every 10th visit; KX modifier at 15th visit    PT Start Time  1345    PT Stop Time  1425    PT Time Calculation (min)  40 min    Equipment Utilized During Treatment  Other (comment)   rollator   Activity Tolerance  Patient limited by pain    Behavior During Therapy  Anxious       Past Medical History:  Diagnosis Date  . Abnormal nuclear cardiac imaging test   . Anemia   . Anemia, iron deficiency 07/02/2015  . Anxiety   . Arthritis    "knees, back, fingers, toes; joints" (01/07/2015)  . 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 CAD (coronary artery disease)    a. Abnl nuc 11/2014. Cath 12/2014 - turned down for CABG. Ultimately s/p TTVP, rotational atherectomy, PTCA and stenting of the ostial LCx and left main into the LAD (crush technique), and IVUS of the LAD/Left main. // b. Myoview 11/17: EF 48, poor quality/significant artifact; inf-lateral, inferior ischemia; Intermediate Risk  . Chronic atrial fibrillation ([place])    a.  First noted post-op 9/15 spinal fusion. She had cardioversion, not on anticoagulation. Fall risk, unsteady. // failed DCCV // Holter 10/17: AFib, Avg HR 97, PVCs, no other arrhythmia  . Chronic back pain greater than 3 months duration    a. spinal stenosis. Spinal fusion with rods in 2/15 at [facility] spinal fusion 9/15.  Marland Kitchen Chronic diastolic CHF    Echo 4/68:  EF 50-55%, trivial AI, midl MR, mod LAE, PASP 37 mmHg // Echocardiogram 03/2020: EF 45-50, no RWMA, mild LVH, mild reduced RVSF,  mildly elevated PASP (RVSP 38.3), mild LAE, mild MR, mild to mod TR, trivial AI   . Circadian rhythm sleep disorder   . CTS (carpal tunnel syndrome)   . Deficiency anemia 11/10/2014  . Depression   . Diarrhea   . Diverticulitis of colon   . Esophageal stricture   . Gastritis   . Gastroesophageal hernia 07/06/2013  . GERD (gastroesophageal reflux disease)   . Hiatal hernia   . History of blood transfusion    "most of them related to OR's"   . HTN (hypertension)   . Hyperlipidemia   . IBS (irritable bowel syndrome)   . Incontinence 10/13/2012  . Insomnia   . Ischemic chest pain ([place])   . Memory disorder 12/04/2014  . Metabolic syndrome 0/02/2121  . MGUS (monoclonal gammopathy of unknown significance) dx'd 11/2014   a. Neg BMB 11/2014.  . Multiple falls   . Obesity   . Obstructive sleep apnea    "have mask; don't wear it" (01/07/2015)  . Orthostasis   . PAT (paroxysmal atrial tachycardia) ([place])   . Personal history of colonic polyps 10/25/2011 & 12/02/11   not retrieved Dr Lyla Son & tubular adenomas  . Pneumonia 03/2014  . Sinus bradycardia    a. Baseline HR 50s-60s.  . Stroke [facility]) early 2000's   "small"; denies residual on 01/07/2015)  Past Surgical History:  Procedure Laterality Date  . BACK SURGERY    . BONE MARROW BIOPSY  11/2014  . CARDIAC CATHETERIZATION  12/27/2014   Procedure: INTRAVASCULAR PRESSURE WIRE/FFR STUDY;  Surgeon: Larey Dresser, MD;  Location: [facility] CATH LAB;  Service: Cardiovascular;;  . CARDIAC CATHETERIZATION N/A 11/25/2016   Procedure: Left Heart Cath and Coronary Angiography;  Surgeon: Sherren Mocha, MD;  Location: [facility] CV LAB;  Service: Cardiovascular;  Laterality: N/A;  . CARDIOVERSION N/A 02/13/2016   Procedure: CARDIOVERSION;  Surgeon: Josue Hector, MD;  Location: [facility] ENDOSCOPY;  Service: Cardiovascular;  Laterality: N/A;  . CARPAL TUNNEL RELEASE Right 1980's  . CATARACT EXTRACTION Bilateral   . CATARACT EXTRACTION W/ INTRAOCULAR LENS   IMPLANT, BILATERAL  2000's  . CORONARY ANGIOPLASTY WITH STENT PLACEMENT  01/07/2015   "2"  . CORONARY STENT PLACEMENT    . DILATION AND CURETTAGE OF UTERUS    . ESOPHAGOGASTRODUODENOSCOPY (EGD) WITH ESOPHAGEAL DILATION  "several times"  . JOINT REPLACEMENT    . KNEE ARTHROSCOPY Left 1995  . LAPAROSCOPIC CHOLECYSTECTOMY  2003  . LEFT HEART CATHETERIZATION WITH CORONARY ANGIOGRAM N/A 12/27/2014   Procedure: LEFT HEART CATHETERIZATION WITH CORONARY ANGIOGRAM;  Surgeon: Larey Dresser, MD;  Location: [facility] CATH LAB;  Service: Cardiovascular;  Laterality: N/A;  . PERCUTANEOUS CORONARY ROTOBLATOR INTERVENTION (PCI-R)  01/07/2015  . PERCUTANEOUS CORONARY ROTOBLATOR INTERVENTION (PCI-R) N/A 01/07/2015   Procedure: PERCUTANEOUS CORONARY ROTOBLATOR INTERVENTION (PCI-R);  Surgeon: Blane Ohara, MD;  Location: [facility] CATH LAB;  Service: Cardiovascular;  Laterality: N/A;  . POSTERIOR FUSION THORACIC SPINE  08/2015  . POSTERIOR LUMBAR FUSION  01/2015  . TOTAL KNEE ARTHROPLASTY Bilateral 1990's - 2000's    There were no vitals filed for this visit.   Subjective Assessment - 05/06/20 1855    Subjective  COVID-19 screening performed upon arrival.Patient arrives to physical therapy with reports of low back pain that began around Christmas time, 2020. Patient reported bending forward to get an ornament out of a box when her back started to have pain. Patient reports pain with all ADLs and limited standing tolerance. Patient reported getting two injections with no relief. Patient reports pain at worst as 10/10 and describes it as sharp. Patient reports pain decreases with laying down or sitting. Patient's goals are to decrease pain, stand longer, and have less difficulties with ADLs and home activities.    Patient is accompained by:  Family member   Daughter   Pertinent History  HTN, AFib, CAD, CHF, thoracic spinal fusion 2016, lumbar spinal fusion 2016, history of stroke    Limitations  Standing;Walking;House hold  activities    How long can you stand comfortably?  short periods only    How long can you walk comfortably?  room to room    Diagnostic tests  MRI    Patient Stated Goals  decrease pain    Currently in Pain?  Yes    Pain Score  10-Worst pain ever    Pain Location  Back    Pain Orientation  Lower    Pain Descriptors / Indicators  Sharp    Pain Type  Chronic pain    Pain Radiating Towards  left glute    Pain Onset  More than a month ago    Pain Frequency  Constant    Aggravating Factors   standing, walking    Pain Relieving Factors  layind down or sitting    Effect of Pain on Daily Activities  unable to peform  ADLs         [facility] PT Assessment - 05/06/20 0001      Assessment   Medical Diagnosis  Spinal stenosis of lumbar region without neurogenic claudication    Referring Provider (PT)  Levy Pupa, PA    Onset Date/Surgical Date  --   December 2020   Next MD Visit  not made    Prior Therapy  no      Precautions   Precautions  Fall    Precaution Comments  VERY FEARFUL OF FALLING; AVOID SUPINE IF POSSIBLE.      Balance Screen   Has the patient fallen in the past 6 months  No    Has the patient had a decrease in activity level because of a fear of falling?   No    Is the patient reluctant to leave their home because of a fear of falling?   No      Home Environment   Living Environment  Private residence    Living Arrangements  Alone      Prior Function   Level of Independence  Independent with basic ADLs;Needs assistance with homemaking      Posture/Postural Control   Posture/Postural Control  Postural limitations    Postural Limitations  Rounded Shoulders;Forward head;Decreased lumbar lordosis      ROM / Strength   AROM / PROM / Strength  Strength      Strength   Overall Strength  Deficits;Due to pain    Strength Assessment Site  Hip;Knee    Right/Left Hip  Right;Left    Right Hip Flexion  2+/5    Right Hip ABduction  3-/5    Right Hip ADduction  3-/5     Left Hip Flexion  2+/5    Left Hip ABduction  3-/5    Left Hip ADduction  3-/5    Right/Left Knee  Right;Left    Right Knee Flexion  3+/5    Right Knee Extension  3+/5    Left Knee Flexion  3+/5    Left Knee Extension  3+/5      Palpation   Palpation comment  increased tone to bilateral lumbar paraspinals, notable scar tissue from previous surgery, tender at L QL and glute      Transfers   Comments  slow transfers from sit<>stand; max assist for sidelying to sit from daughter, very fearful of falling. Pain with bed mobility      Ambulation/Gait   Assistive device  Rollator    Gait Pattern  Step-through pattern;Decreased stride length;Antalgic;Trunk flexed    Gait velocity  slow cautious gait                  Objective measurements completed on examination: See above findings.              PT Education - 05/06/20 1858    Education Details  draw ins,  hip abduction, hip adduction    Person(s) Educated  Patient;Child(ren)    Methods  Explanation;Demonstration;Handout    Comprehension  Verbalized understanding;Returned demonstration          PT Long Term Goals - 05/06/20 1900      PT LONG TERM GOAL #1   Title  Patient will be independent with HEP    Time  6    Period  Weeks    Status  New      PT LONG TERM GOAL #2   Title  Patient will report ability to perform ADLs  with low back pain less than or equal to 4/10.    Time  6    Period  Weeks    Status  New      PT LONG TERM GOAL #3   Title  Patient will report ability to stand at kitchen counter with UE support for 10 mins for safe meal preparation.    Time  6    Period  Weeks    Status  New             Plan - 05/06/20 2053    Clinical Impression Statement  Patient is an 79 year old female who presents to physical therapy with her daughter with low back pain, decrease LE MMT, and pain with walking and transfers. Patient is very fearful of falling and was unable to rise to a sitting  position while sidelying with the help of the PT. Patient's daughter max A. to sitting. Patient very tender to palpation to bilateral lumbar paraspinals with notable scar tissue from previous lumbar surgery. Patient and PT discussed plan of care and discussed HEP to maximize PT benefit. Patient reported understanding. Patient would benefit from skilled physical therapy to address deficits and address patient's goals.    Personal Factors and Comorbidities  Comorbidity 3+;Age    Comorbidities  HTN, AFib, CAD, CHF, thoracic spinal fusion 2016, lumbar spinal fusion 2016, history of stroke    Examination-Activity Limitations  Bed Mobility;Locomotion Level;Transfers;Stand    Examination-Participation Restrictions  Meal Prep    Stability/Clinical Decision Making  Stable/Uncomplicated    Clinical Decision Making  Low    Rehab Potential  Poor    PT Frequency  2x / week    PT Duration  6 weeks    PT Treatment/Interventions  ADLs/Self Care Home Management;Cryotherapy;Electrical Stimulation;Moist Heat;Ultrasound;Gait training;Stair training;Patient/family education;Functional mobility training;Therapeutic activities;Therapeutic exercise;Balance training;Neuromuscular re-education;Manual techniques;Passive range of motion    PT Next Visit Plan  avoid supine if possible as patient is very fearful of falling. Nustep, STW/M to lumbar musculature, modalities PRN for pain relief    PT Home Exercise Plan  see patient education section    Consulted and Agree with Plan of Care  Patient       Patient will benefit from skilled therapeutic intervention in order to improve the following deficits and impairments:  Decreased activity tolerance, Difficulty walking, Pain, Postural dysfunction, Improper body mechanics, Decreased strength, Decreased mobility, Decreased balance  Visit Diagnosis: Chronic low back pain, unspecified back pain laterality, unspecified whether sciatica present  Difficulty in walking, not elsewhere  classified  Muscle weakness (generalized)     Problem List Patient Active Problem List   Diagnosis Date Noted  . Dysphagia 03/25/2020  . HOH (hard of hearing) 03/25/2020  . Lichen sclerosus et atrophicus of the vulva 01/03/2019  . Chronic pain syndrome 02/14/2018  . Long-term current use of opiate analgesic 02/14/2018  . Anxiety 07/21/2016  . Chronic diastolic CHF (congestive heart failure) ([place]) 07/17/2015  . DDD (degenerative disc disease), lumbar 03/22/2015  . Fall 03/22/2015  . 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 Difficulty hearing 03/22/2015  . Lumbar scoliosis 03/22/2015  . History of pelvic fracture 03/22/2015  . History of other specified conditions presenting hazards to health 03/22/2015  . MGUS (monoclonal gammopathy of unknown significance) 01/08/2015  . Permanent atrial fibrillation ([place]) 12/22/2014  . CAD (coronary artery disease) 12/22/2014  . Memory disorder 12/04/2014  . Angulation of spine 09/18/2014  . Metabolic syndrome 61/95/0932  . S/P spinal fusion 02/23/2014  . Chronic pain  associated with significant psychosocial dysfunction 02/23/2014  . Gastroesophageal hernia 07/06/2013  . Incontinence 10/13/2012  . 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 DEGENERATIVE JOINT DISEASE 04/08/2010  . Obesity (BMI 30.0-34.9) 04/04/2009  . CIRCADIAN RHYTHM SLEEP D/O DELAY SLEEP PHSE TYPE 04/04/2009  . CARPAL TUNNEL SYNDROME 04/04/2009  . IRRITABLE BOWEL SYNDROME 11/13/2008  . ESOPHAGEAL STRICTURE 11/12/2008  . GERD 11/12/2008  . Paralysis of diaphragm 11/12/2008  . DIVERTICULOSIS, COLON 11/12/2008  . Obstructive sleep apnea 05/23/2008  . Hyperlipidemia 05/22/2008  . Depression 05/22/2008  . Essential hypertension 05/22/2008  . INSOMNIA 05/22/2008    Gabriela Eves, PT, DPT 05/06/2020, 8:56 PM  [facility] [address] [place], [place], [address] Phone: [phone]   Fax:  [phone]  Name: [name] Gillispie  [name] MRN: [id number] Date of Birth: Jan 16, 1941

## 2020-05-06 NOTE — Telephone Encounter (Signed)
  Prescription Request  05/06/2020  What is the name of the medication or equipment? ELIQUIS 5 MG TABS tablet  Have you contacted your pharmacy to request a refill? (if applicable) yes was told to called pcp  Which pharmacy would you like this sent to? [facility]   Patient notified that their request is being sent to the clinical staff for review and that they should receive a response within 2 business days.

## 2020-05-08 ENCOUNTER — Telehealth: Payer: Self-pay | Admitting: Family Medicine

## 2020-05-08 NOTE — Telephone Encounter (Signed)
Pt needs a PA on her Eliquis, pt only has a week of medicine left

## 2020-05-09 NOTE — Telephone Encounter (Signed)
Will start PA - once info from pharm comes over - jhb

## 2020-05-10 NOTE — Telephone Encounter (Signed)
Aware.  E-scribe failed and did not go through.  Mail in pharmacy was called and eliquis script was given verbally.

## 2020-05-14 ENCOUNTER — Ambulatory Visit: Payer: Medicare HMO | Admitting: Physical Therapy

## 2020-05-15 ENCOUNTER — Encounter: Payer: Self-pay | Admitting: Physician Assistant

## 2020-05-15 ENCOUNTER — Other Ambulatory Visit: Payer: Self-pay

## 2020-05-15 ENCOUNTER — Ambulatory Visit: Payer: Medicare HMO | Admitting: Physician Assistant

## 2020-05-15 VITALS — BP 118/68 | HR 89 | Ht 67.0 in | Wt 185.4 lb

## 2020-05-15 DIAGNOSIS — R0602 Shortness of breath: Secondary | ICD-10-CM | POA: Diagnosis not present

## 2020-05-15 DIAGNOSIS — I4821 Permanent atrial fibrillation: Secondary | ICD-10-CM | POA: Diagnosis not present

## 2020-05-15 DIAGNOSIS — I25119 Atherosclerotic heart disease of native coronary artery with unspecified angina pectoris: Secondary | ICD-10-CM

## 2020-05-15 DIAGNOSIS — I5032 Chronic diastolic (congestive) heart failure: Secondary | ICD-10-CM | POA: Diagnosis not present

## 2020-05-15 NOTE — Patient Instructions (Signed)
Medication Instructions:  Your physician recommends that you continue on your current medications as directed. Please refer to the Current Medication list given to you today.  *If you need a refill on your cardiac medications before your next appointment, please call your pharmacy*  Lab Work:  None ordered today  Testing/Procedures:  Your physician has requested that you have a lexiscan myoview. For further information please visit [email or web address]. Please follow instruction sheet, as given.  Follow-Up: At [facility], you and your health needs are our priority.  As part of our continuing mission to provide you with exceptional heart care, we have created designated Provider Care Teams.  These Care Teams include your primary Cardiologist (physician) and Advanced Practice Providers (APPs -  Physician Assistants and Nurse Practitioners) who all work together to provide you with the care you need, when you need it.  We recommend signing up for the patient portal called "MyChart".  Sign up information is provided on this After Visit Summary.  MyChart is used to connect with patients for Virtual Visits (Telemedicine).  Patients are able to view lab/test results, encounter notes, upcoming appointments, etc.  Non-urgent messages can be sent to your provider as well.   To learn more about what you can do with MyChart, go to [email or web address].    Your next appointment:   6 month(s)  The format for your next appointment:   In Person  Provider:   Sherren Mocha, MD

## 2020-05-15 NOTE — Progress Notes (Signed)
Cardiology Office Note:    Date:  05/15/2020   ID:  [name] [name], DOB 1941-01-05, MRN [id number]  PCP:  [name] Norlander, DO  Cardiologist:  [name] Mocha, MD  Electrophysiologist:  None   Referring MD: [name] Norlander, DO   Chief Complaint:  Follow-up (CAD, AFib, CHF)    Patient Profile:    [name] [name] is a 79 y.o. female with:   Coronary artery disease ? Cath 12/15: Complex bifurcational distal LM/ostial LCx disease- poorCABG candidate ? PCI 12/15: DES to distal LM and ostial LCx ? Myoview 11/17: + Inferior ischemia ? Cath 12/17: Patent LM/LCx stent; no significant RCA disease  Diastolic CHF  Permanent atrial fibrillation ? S/p DCCV 1/17>>ERAF>>rate control strategy  Hypertension  Hyperlipidemia  S/p multiple back surgeries; managed with chronic narcotic pain medication  Hxof orthostatic hypotension; s/pmultiple falls in the past  Hepatic steatosis (elevated LFTs)  MGUS  Prior CV studies: Echocardiogram 04/15/2020 EF 45-50, no R WMA, mild LVH, mildly reduced RV SF, mild LAE, mild MR, mild-moderate TR, trivial AI, RVSP 38.3  Echocardiogram 11/24/2018 EF 50-55, normal wall motion, trivial AI, trivial MR, severe LAE, moderate RAE, mild TR, PASP 35  ABIs 01/18/2018 Final Interpretation: Right: Resting right ankle-brachial index is within normal range. No evidence of significant right lower extremity arterial disease. The right toe-brachial index is normal.  Left: Resting left ankle-brachial index is within normal range. No evidence of significant left lower extremity arterial disease. The left toe-brachial index is normal.  Echocardiogram 12/22/2017 EF 55-60, normal wall motion, mild MAC, trivial MR, severe LAE, normal RV SF, mild TR, PASP 31  UE Vascular US 12/17 Patent right radial artery and veins, without evidence of fistula or pseudoaneurysm. Probable hematoma anterior to the distal right radial artery.  LHC 11/25/16 LM stent  patent (stent extends from LM into pLAD) LAD irregs LCx stent patent with 25 RCA mid 40  Myoview 11/19/16 EF 48, poor quality/significant artifact; inf-lateral, inferior ischemia; Intermediate Risk  Holter 09/10/16 The basic rhythm is atrial fibrillation with average HR 97 bpm There are occasional ventricular ectopics No other arrhythmia identified No bradycardic events  Holter 3/17 The baseline rhythm is atrial fibrillation The average ventricular rate is 96 bpm There are periods of rapid atrial fib and slow atrial fib with pauses up to 3 seconds There are single PVC's or aberrated beats with a rare ventricular couplet  Echo 01/29/16 EF 50-55%, trivial AI, mild MR, mod LAE, PASP 37 mmHg  LHC 1/16 LM dist ? 50% LAD prox 30% - FFR 0.82 LCx ostial 70% - FFR 0.86 >> 0.66 RCA mid 40% CABG deferred b/c of poor functional capacity DGL:OVFIEPP bifurcational distal LM and ostial LCx with rotational atherectomy and IVUS guided with 2.75 x 12 mm Synergy DES to ostial LCx;4 x 16 mm Synergy DES to distal LM  Myoview 12/15 Intermediate risk stress nuclear study Moderate area of anteroapical and anteroseptal ischemia Also inferolateral wall infarct with moderate peri infarct ischemiaStudy suggests multi vessel diseae.LV Ejection Fraction: 57%  Echo 7/11 Mild LVH, EF 55-60%, no RWMA, mild MR  History of Present Illness:    [name] [name] was last seen in clinic in April 2021.  She noted symptoms of worsening shortness of breath.  She had evidence of volume excess on exam and I adjusted her furosemide.  A follow-up echocardiogram demonstrated fairly stable LV function with an EF of 45-50%.  Chest x-ray did not demonstrate overt edema.  She returns for follow-up.  She is here today with her daughter.  She continues to struggle with significant back issues.  She now has a lift chair and is unable to do most activities on her own.  She does note that she continues to have significant  shortness of breath with activity.  She has not had chest pain, syncope.  She does have lower extremity swelling that increases throughout the day and improves with elevation.  Past Medical History:  Diagnosis Date  . Abnormal nuclear cardiac imaging test   . Anemia   . Anemia, iron deficiency 07/02/2015  . Anxiety   . Arthritis    "knees, back, fingers, toes; joints" (01/07/2015)  . 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 CAD (coronary artery disease)    a. Abnl nuc 11/2014. Cath 12/2014 - turned down for CABG. Ultimately s/p TTVP, rotational atherectomy, PTCA and stenting of the ostial LCx and left main into the LAD (crush technique), and IVUS of the LAD/Left main. // b. Myoview 11/17: EF 48, poor quality/significant artifact; inf-lateral, inferior ischemia; Intermediate Risk  . Chronic atrial fibrillation ([name] [name])    a.  First noted post-op 9/15 spinal fusion. She had cardioversion, not on anticoagulation. Fall risk, unsteady. // failed DCCV // Holter 10/17: AFib, Avg HR 97, PVCs, no other arrhythmia  . Chronic back pain greater than 3 months duration    a. spinal stenosis. Spinal fusion with rods in 2/15 at [facility] spinal fusion 9/15.  Marland Kitchen Chronic diastolic CHF    Echo 4/17:  EF 50-55%, trivial AI, midl MR, mod LAE, PASP 37 mmHg // Echocardiogram 03/2020: EF 45-50, no RWMA, mild LVH, mild reduced RVSF, mildly elevated PASP (RVSP 38.3), mild LAE, mild MR, mild to mod TR, trivial AI   . Circadian rhythm sleep disorder   . CTS (carpal tunnel syndrome)   . Deficiency anemia 11/10/2014  . Depression   . Diarrhea   . Diverticulitis of colon   . Esophageal stricture   . Gastritis   . Gastroesophageal hernia 07/06/2013  . GERD (gastroesophageal reflux disease)   . Hiatal hernia   . History of blood transfusion    "most of them related to OR's"   . HTN (hypertension)   . Hyperlipidemia   . IBS (irritable bowel syndrome)   . Incontinence 10/13/2012  . Insomnia   . Ischemic chest pain ([name] [name])    . Memory disorder 12/04/2014  . Metabolic syndrome 4/0/8144  . MGUS (monoclonal gammopathy of unknown significance) dx'd 11/2014   a. Neg BMB 11/2014.  . Multiple falls   . Obesity   . Obstructive sleep apnea    "have mask; don't wear it" (01/07/2015)  . Orthostasis   . PAT (paroxysmal atrial tachycardia) ([place])   . Personal history of colonic polyps 10/25/2011 & 12/02/11   not retrieved Dr Lyla Son & tubular adenomas  . Pneumonia 03/2014  . Sinus bradycardia    a. Baseline HR 50s-60s.  . Stroke [facility]) early 2000's   "small"; denies residual on 01/07/2015)    Current Medications: Current Meds  Medication Sig  . aspirin EC 81 MG tablet Take 1 tablet (81 mg total) by mouth daily.  . Calcium Citrate-Vitamin D (CALCIUM + D PO) Take 1 tablet by mouth daily.  . Cholecalciferol (VITAMIN D) 2000 units tablet Take 2,000 Units by mouth daily.  . clobetasol cream (TEMOVATE) 8.18 % Apply 1 application topically See admin instructions. Applies to vaginal area twice a week.  . diltiazem (CARDIZEM CD) 120 MG 24 hr capsule Take  1 capsule (120 mg total) by mouth 2 (two) times daily.  . diphenoxylate-atropine (LOMOTIL) 2.5-0.025 MG per tablet Take 1 tablet by mouth 4 (four) times daily as needed for diarrhea or loose stools.   Marland Kitchen ELIQUIS 5 MG TABS tablet Take 1 tablet (5 mg total) by mouth 2 (two) times daily.  . Eszopiclone 3 MG TABS Take 1 tablet (3 mg total) by mouth at bedtime. For insomnia,take immediately before bedtime.  . fentaNYL (DURAGESIC - DOSED MCG/HR) 50 MCG/HR Place 1 patch (50 mcg total) onto the skin every 3 (three) days.  . Ferrous Sulfate (IRON PO) Take by mouth. Take one tablet by mouth daily (OTC Supplement)  . FLUoxetine (PROZAC) 40 MG capsule Take 1 capsule (40 mg total) by mouth daily.  . furosemide (LASIX) 80 MG tablet Take 1 tablet (80 mg total) by mouth daily.  Marland Kitchen HYDROcodone-acetaminophen (NORCO) 10-325 MG per tablet Take 1 tablet by mouth every 4 (four) hours as needed for  moderate pain.   . methocarbamol (ROBAXIN) 500 MG tablet Take 500 mg by mouth 4 (four) times daily as needed for muscle spasms.  . Multiple Vitamin (MULTIVITAMIN) tablet Take 1 tablet by mouth daily. For supplement  . nitroGLYCERIN (NITROSTAT) 0.4 MG SL tablet Place 1 tablet (0.4 mg total) under the tongue every 5 (five) minutes as needed for chest pain (up to 3 doses).  . pantoprazole (PROTONIX) 40 MG tablet Take 1 tablet (40 mg total) by mouth 2 (two) times daily.  . potassium chloride SA (K-DUR) 20 MEQ tablet Take 1 tablet (20 mEq total) by mouth daily.  . rosuvastatin (CRESTOR) 10 MG tablet Take 1 tablet (10 mg total) by mouth daily.     Allergies:   Buprenorphine hcl, Cymbalta [duloxetine hcl], Morphine and related, Oxycodone-acetaminophen, Valium [diazepam], Statins, Altace [ramipril], Floxin [ofloxacin], Hydromorphone, Lovaza [omega-3-acid ethyl esters], Trilipix [choline fenofibrate], and Zetia [ezetimibe]   Social History   Tobacco Use  . Smoking status: Never Smoker  . Smokeless tobacco: Never Used  Substance Use Topics  . Alcohol use: No    Comment: 01/07/2015 "glass of wine at Christmas, maybe"  . Drug use: No     Family Hx: The patient's family history includes Colon cancer in her sister and sister; Coronary artery disease in her brother, father, and mother; Emphysema in her brother and sister; Peripheral vascular disease in her father; Sleep apnea in her son. There is no history of Dementia.  ROS   EKGs/Labs/Other Test Reviewed:    EKG:  EKG is not ordered today.  The ekg ordered today demonstrates n/a  Recent Labs: 07/20/2019: TSH 3.030 04/01/2020: ALT 18; BUN 12; Creatinine, Ser 0.87; Hemoglobin 12.9; Platelets 228; Potassium 3.9; Sodium 139   Recent Lipid Panel Lab Results  Component Value Date/Time   CHOL 138 07/20/2019 12:52 PM   CHOL 156 05/11/2013 03:36 PM   TRIG 123 07/20/2019 12:52 PM   TRIG 121 02/18/2016 03:04 PM   TRIG 239 (H) 05/11/2013 03:36 PM    HDL 61 07/20/2019 12:52 PM   HDL 57 02/18/2016 03:04 PM   HDL 43 05/11/2013 03:36 PM   CHOLHDL 2.3 07/20/2019 12:52 PM   LDLCALC 52 07/20/2019 12:52 PM   LDLCALC 50 08/21/2014 11:41 AM   LDLCALC 65 05/11/2013 03:36 PM    Physical Exam:    VS:  BP 118/68   Pulse 89   Ht _0  (1.702 m)   Wt 185 lb 6.4 oz (84.1 kg)   SpO2 95%   BMI  29.04 kg/m     Wt Readings from Last 3 Encounters:  05/15/20 185 lb 6.4 oz (84.1 kg)  04/02/20 187 lb (84.8 kg)  04/01/20 187 lb (84.8 kg)     Constitutional:      Appearance: Healthy appearance. Not in distress.  Pulmonary:     Effort: Pulmonary effort is normal.     Breath sounds: No wheezing. No rales.  Cardiovascular:     Normal rate. Regular rhythm. Normal S1. Normal S2.     Murmurs: There is no murmur.  Edema:    Peripheral edema absent.  Abdominal:     Palpations: Abdomen is soft.  Musculoskeletal:     Cervical back: Neck supple. Skin:    General: Skin is warm and dry.  Neurological:     Mental Status: Alert and oriented to person, place and time.     Cranial Nerves: Cranial nerves are intact.      ASSESSMENT & PLAN:    1. Chronic diastolic (congestive) heart failure (HCC) Recent echocardiogram with EF 50%.  She is mainly limited by back issues.  She is NYHA III-IIIb.  Volume status overall appears to be stable.  Question if her shortness of breath may be an anginal equivalent versus deconditioning.  Continue current dose of furosemide.  2. Coronary artery disease involving native coronary artery of native heart with angina pectoris ([place]) 3. Shortness of breath History of bifurcational PCI with DES to the distal left main and ostial LCx in January 2016. Cardiac catheterization in 2017 demonstrated patent stents.  She continues to have worsening shortness of breath with exertion.  It is unclear what her anginal equivalent is from her prior history.  I suspect her shortness of breath is mainly related to deconditioning from her back  issues.  However, I have offered her a nuclear stress test to further evaluate.  She would like to pursue this.  Continue current dose of aspirin, rosuvastatin.  Arrange Lexiscan Myoview.  4. Permanent atrial fibrillation (HCC) Overall, rate appears to be well controlled.  Continue current dose of Apixaban.  Recent creatinine, hemoglobin stable.   Dispo:  Return in about 6 months (around 11/15/2020) for Routine Follow Up, w/ Dr. Burt Knack, in person.   Medication Adjustments/Labs and Tests Ordered: Current medicines are reviewed at length with the patient today.  Concerns regarding medicines are outlined above.  Tests Ordered: Orders Placed This Encounter  Procedures  . MYOCARDIAL PERFUSION IMAGING   Medication Changes: No orders of the defined types were placed in this encounter.   Signed, Richardson Dopp, PA-C  05/15/2020 5:08 PM    [facility] [address], [place], [place]  [address] Phone: [phone]; Fax: [phone]

## 2020-05-17 ENCOUNTER — Encounter: Payer: Self-pay | Admitting: Physical Therapy

## 2020-05-17 ENCOUNTER — Other Ambulatory Visit: Payer: Self-pay

## 2020-05-17 ENCOUNTER — Ambulatory Visit: Payer: Medicare HMO | Admitting: Physical Therapy

## 2020-05-17 DIAGNOSIS — M545 Low back pain, unspecified: Secondary | ICD-10-CM

## 2020-05-17 DIAGNOSIS — R262 Difficulty in walking, not elsewhere classified: Secondary | ICD-10-CM

## 2020-05-17 DIAGNOSIS — G8929 Other chronic pain: Secondary | ICD-10-CM

## 2020-05-17 DIAGNOSIS — M6281 Muscle weakness (generalized): Secondary | ICD-10-CM

## 2020-05-17 NOTE — Therapy (Signed)
[facility] [place], [place], [address] Phone: [phone]   Fax:  [phone]  Physical Therapy Treatment  Patient Details  Name: [name] [name] MRN: [id number] Date of Birth: 1941-01-10 Referring Provider (PT): Levy Pupa, [place]   Encounter Date: 05/17/2020  PT End of Session - 05/17/20 1212    Visit Number  2    Number of Visits  12    Date for PT Re-Evaluation  06/17/20    Authorization Type  Progress note every 10th visit; KX modifier at 15th visit    PT Start Time  1115    PT Stop Time  1200    PT Time Calculation (min)  45 min    Equipment Utilized During Treatment  Other (comment)   rollator   Activity Tolerance  Patient limited by pain    Behavior During Therapy  Anxious       Past Medical History:  Diagnosis Date  . Abnormal nuclear cardiac imaging test   . Anemia   . Anemia, iron deficiency 07/02/2015  . Anxiety   . Arthritis    "knees, back, fingers, toes; joints" (01/07/2015)  . 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 CAD (coronary artery disease)    a. Abnl nuc 11/2014. Cath 12/2014 - turned down for CABG. Ultimately s/p TTVP, rotational atherectomy, PTCA and stenting of the ostial LCx and left main into the LAD (crush technique), and IVUS of the LAD/Left main. // b. Myoview 11/17: EF 48, poor quality/significant artifact; inf-lateral, inferior ischemia; Intermediate Risk  . Chronic atrial fibrillation ([place])    a.  First noted post-op 9/15 spinal fusion. She had cardioversion, not on anticoagulation. Fall risk, unsteady. // failed DCCV // Holter 10/17: AFib, Avg HR 97, PVCs, no other arrhythmia  . Chronic back pain greater than 3 months duration    a. spinal stenosis. Spinal fusion with rods in 2/15 at [facility] spinal fusion 9/15.  Marland Kitchen Chronic diastolic CHF    Echo 6/65:  EF 50-55%, trivial AI, midl MR, mod LAE, PASP 37 mmHg // Echocardiogram 03/2020: EF 45-50, no RWMA, mild LVH, mild reduced RVSF,  mildly elevated PASP (RVSP 38.3), mild LAE, mild MR, mild to mod TR, trivial AI   . Circadian rhythm sleep disorder   . CTS (carpal tunnel syndrome)   . Deficiency anemia 11/10/2014  . Depression   . Diarrhea   . Diverticulitis of colon   . Esophageal stricture   . Gastritis   . Gastroesophageal hernia 07/06/2013  . GERD (gastroesophageal reflux disease)   . Hiatal hernia   . History of blood transfusion    "most of them related to OR's"   . HTN (hypertension)   . Hyperlipidemia   . IBS (irritable bowel syndrome)   . Incontinence 10/13/2012  . Insomnia   . Ischemic chest pain ([place])   . Memory disorder 12/04/2014  . Metabolic syndrome 08/29/3569  . MGUS (monoclonal gammopathy of unknown significance) dx'd 11/2014   a. Neg BMB 11/2014.  . Multiple falls   . Obesity   . Obstructive sleep apnea    "have mask; don't wear it" (01/07/2015)  . Orthostasis   . PAT (paroxysmal atrial tachycardia) ([place])   . Personal history of colonic polyps 10/25/2011 & 12/02/11   not retrieved Dr Lyla Son & tubular adenomas  . Pneumonia 03/2014  . Sinus bradycardia    a. Baseline HR 50s-60s.  . Stroke [facility]) early 2000's   "small"; denies residual on 01/07/2015)  Past Surgical History:  Procedure Laterality Date  . BACK SURGERY    . BONE MARROW BIOPSY  11/2014  . CARDIAC CATHETERIZATION  12/27/2014   Procedure: INTRAVASCULAR PRESSURE WIRE/FFR STUDY;  Surgeon: Larey Dresser, MD;  Location: [facility] CATH LAB;  Service: Cardiovascular;;  . CARDIAC CATHETERIZATION N/A 11/25/2016   Procedure: Left Heart Cath and Coronary Angiography;  Surgeon: Sherren Mocha, MD;  Location: [facility] CV LAB;  Service: Cardiovascular;  Laterality: N/A;  . CARDIOVERSION N/A 02/13/2016   Procedure: CARDIOVERSION;  Surgeon: Josue Hector, MD;  Location: [facility] ENDOSCOPY;  Service: Cardiovascular;  Laterality: N/A;  . CARPAL TUNNEL RELEASE Right 1980's  . CATARACT EXTRACTION Bilateral   . CATARACT EXTRACTION W/ INTRAOCULAR LENS   IMPLANT, BILATERAL  2000's  . CORONARY ANGIOPLASTY WITH STENT PLACEMENT  01/07/2015   "2"  . CORONARY STENT PLACEMENT    . DILATION AND CURETTAGE OF UTERUS    . ESOPHAGOGASTRODUODENOSCOPY (EGD) WITH ESOPHAGEAL DILATION  "several times"  . JOINT REPLACEMENT    . KNEE ARTHROSCOPY Left 1995  . LAPAROSCOPIC CHOLECYSTECTOMY  2003  . LEFT HEART CATHETERIZATION WITH CORONARY ANGIOGRAM N/A 12/27/2014   Procedure: LEFT HEART CATHETERIZATION WITH CORONARY ANGIOGRAM;  Surgeon: Larey Dresser, MD;  Location: [facility] CATH LAB;  Service: Cardiovascular;  Laterality: N/A;  . PERCUTANEOUS CORONARY ROTOBLATOR INTERVENTION (PCI-R)  01/07/2015  . PERCUTANEOUS CORONARY ROTOBLATOR INTERVENTION (PCI-R) N/A 01/07/2015   Procedure: PERCUTANEOUS CORONARY ROTOBLATOR INTERVENTION (PCI-R);  Surgeon: Blane Ohara, MD;  Location: [facility] CATH LAB;  Service: Cardiovascular;  Laterality: N/A;  . POSTERIOR FUSION THORACIC SPINE  08/2015  . POSTERIOR LUMBAR FUSION  01/2015  . TOTAL KNEE ARTHROPLASTY Bilateral 1990's - 2000's    There were no vitals filed for this visit.  Subjective Assessment - 05/17/20 1209    Subjective  COVID-19 screening performed upon arrival. Patient arrives with 7/10 pain. Patient reports pain with standing and walking    Patient is accompained by:  Family member   Daughter   Pertinent History  HTN, AFib, CAD, CHF, thoracic spinal fusion 2016, lumbar spinal fusion 2016, history of stroke    Limitations  Standing;Walking;House hold activities    How long can you stand comfortably?  short periods only    How long can you walk comfortably?  room to room    Diagnostic tests  MRI    Patient Stated Goals  decrease pain    Currently in Pain?  Yes    Pain Score  7     Pain Location  Back    Pain Orientation  Lower    Pain Descriptors / Indicators  Sharp    Pain Type  Chronic pain    Pain Onset  More than a month ago    Pain Frequency  Constant         [facility] PT Assessment - 05/17/20 0001       Assessment   Medical Diagnosis  Spinal stenosis of lumbar region without neurogenic claudication    Referring Provider (PT)  Levy Pupa, PA    Next MD Visit  not made    Prior Therapy  no      Precautions   Precautions  Fall    Precaution Comments  VERY FEARFUL OF FALLING; AVOID SUPINE IF POSSIBLE.                    [facility] Adult PT Treatment/Exercise - 05/17/20 0001      Exercises   Exercises  Lumbar  Lumbar Exercises: Aerobic   Nustep  level 1 x15 mins with UE and LEs      Lumbar Exercises: Seated   Long Arc Quad on Chair  AROM;Both;2 sets;10 reps    Other Seated Lumbar Exercises  clam shell yellow theraband x2 minutes; marching yellow theraband x2 minutes    Other Seated Lumbar Exercises  seated row yellow theraband x2 mins                  PT Long Term Goals - 05/06/20 1900      PT LONG TERM GOAL #1   Title  Patient will be independent with HEP    Time  6    Period  Weeks    Status  New      PT LONG TERM GOAL #2   Title  Patient will report ability to perform ADLs with low back pain less than or equal to 4/10.    Time  6    Period  Weeks    Status  New      PT LONG TERM GOAL #3   Title  Patient will report ability to stand at kitchen counter with UE support for 10 mins for safe meal preparation.    Time  6    Period  Weeks    Status  New            Plan - 05/17/20 1216    Clinical Impression Statement  Patient arrives severe levels of pain. Patient guided through TEs to which she reported ongoing pain. Patient expressed how HEP provided at evaluation doesn't do her any good and was instructed on slow progression of exercises. Patient instructed that she may feel a different type of pain due to exercise. Patient reported understanding. Patient noted with normal response to modalities upon removal.    Personal Factors and Comorbidities  Comorbidity 3+;Age    Comorbidities  HTN, AFib, CAD, CHF, thoracic spinal fusion 2016, lumbar  spinal fusion 2016, history of stroke    Examination-Activity Limitations  Bed Mobility;Locomotion Level;Transfers;Stand    Examination-Participation Restrictions  Meal Prep    Stability/Clinical Decision Making  Stable/Uncomplicated    Clinical Decision Making  Low    Rehab Potential  Poor    PT Frequency  2x / week    PT Duration  6 weeks    PT Treatment/Interventions  ADLs/Self Care Home Management;Cryotherapy;Electrical Stimulation;Moist Heat;Ultrasound;Gait training;Stair training;Patient/family education;Functional mobility training;Therapeutic activities;Therapeutic exercise;Balance training;Neuromuscular re-education;Manual techniques;Passive range of motion    PT Next Visit Plan  avoid supine if possible as patient is very fearful of falling. Nustep, seated exercises, STW/M to lumbar musculature, modalities PRN for pain relief    PT Home Exercise Plan  see patient education section    Consulted and Agree with Plan of Care  Patient       Patient will benefit from skilled therapeutic intervention in order to improve the following deficits and impairments:  Decreased activity tolerance, Difficulty walking, Pain, Postural dysfunction, Improper body mechanics, Decreased strength, Decreased mobility, Decreased balance  Visit Diagnosis: Chronic low back pain, unspecified back pain laterality, unspecified whether sciatica present  Difficulty in walking, not elsewhere classified  Muscle weakness (generalized)     Problem List Patient Active Problem List   Diagnosis Date Noted  . Dysphagia 03/25/2020  . HOH (hard of hearing) 03/25/2020  . Lichen sclerosus et atrophicus of the vulva 01/03/2019  . Chronic pain syndrome 02/14/2018  . Long-term current use of opiate analgesic 02/14/2018  .  Anxiety 07/21/2016  . Chronic diastolic CHF (congestive heart failure) ([place]) 07/17/2015  . DDD (degenerative disc disease), lumbar 03/22/2015  . Fall 03/22/2015  . 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   . Difficulty hearing 03/22/2015  . Lumbar scoliosis 03/22/2015  . History of pelvic fracture 03/22/2015  . History of other specified conditions presenting hazards to health 03/22/2015  . MGUS (monoclonal gammopathy of unknown significance) 01/08/2015  . Permanent atrial fibrillation ([place]) 12/22/2014  . CAD (coronary artery disease) 12/22/2014  . Memory disorder 12/04/2014  . Angulation of spine 09/18/2014  . Metabolic syndrome 49/96/9249  . S/P spinal fusion 02/23/2014  . Chronic pain associated with significant psychosocial dysfunction 02/23/2014  . Gastroesophageal hernia 07/06/2013  . Incontinence 10/13/2012  . 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 DEGENERATIVE JOINT DISEASE 04/08/2010  . Obesity (BMI 30.0-34.9) 04/04/2009  . CIRCADIAN RHYTHM SLEEP D/O DELAY SLEEP PHSE TYPE 04/04/2009  . CARPAL TUNNEL SYNDROME 04/04/2009  . IRRITABLE BOWEL SYNDROME 11/13/2008  . ESOPHAGEAL STRICTURE 11/12/2008  . GERD 11/12/2008  . Paralysis of diaphragm 11/12/2008  . DIVERTICULOSIS, COLON 11/12/2008  . Obstructive sleep apnea 05/23/2008  . Hyperlipidemia 05/22/2008  . Depression 05/22/2008  . Essential hypertension 05/22/2008  . INSOMNIA 05/22/2008    Gabriela Eves 05/17/2020, 12:22 PM  [facility] [place], [place], [address] Phone: [phone]   Fax:  [phone]  Name: Juanette Urizar Toelle MRN: [id number] Date of Birth: [birth year]

## 2020-05-21 ENCOUNTER — Other Ambulatory Visit: Payer: Self-pay

## 2020-05-21 ENCOUNTER — Ambulatory Visit: Payer: Medicare HMO | Attending: Chiropractic Medicine | Admitting: Physical Therapy

## 2020-05-21 DIAGNOSIS — M545 Low back pain, unspecified: Secondary | ICD-10-CM

## 2020-05-21 DIAGNOSIS — G8929 Other chronic pain: Secondary | ICD-10-CM | POA: Diagnosis present

## 2020-05-21 DIAGNOSIS — R262 Difficulty in walking, not elsewhere classified: Secondary | ICD-10-CM | POA: Diagnosis present

## 2020-05-21 DIAGNOSIS — M6281 Muscle weakness (generalized): Secondary | ICD-10-CM

## 2020-05-21 NOTE — Therapy (Signed)
[facility] [place], [place], [address] Phone: [phone]   Fax:  [phone]  Physical Therapy Treatment  Patient Details  Name: [name] [name] MRN: [id number] Date of Birth: 12/26/1940 Referring Provider (PT): Levy Pupa, [place]   Encounter Date: 05/21/2020  PT End of Session - 05/21/20 1720    Visit Number  3    Number of Visits  12    Date for PT Re-Evaluation  06/17/20    Authorization Type  Progress note every 10th visit; KX modifier at 15th visit    PT Start Time  1600    PT Stop Time  1645    PT Time Calculation (min)  45 min    Equipment Utilized During Treatment  Other (comment)    Activity Tolerance  Patient limited by pain    Behavior During Therapy  Anxious       Past Medical History:  Diagnosis Date   Abnormal nuclear cardiac imaging test    Anemia    Anemia, iron deficiency 07/02/2015   Anxiety    Arthritis    "knees, back, fingers, toes; joints" (01/07/2015)   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 Abnl nuc 11/2014. Cath 12/2014 - turned down for CABG. Ultimately s/p TTVP, rotational atherectomy, PTCA and stenting of the ostial LCx and left main into the LAD (crush technique), and IVUS of the LAD/Left main. // b. Myoview 11/17: EF 48, poor quality/significant artifact; inf-lateral, inferior ischemia; Intermediate Risk   Chronic atrial fibrillation ([place])    a.  First noted post-op 9/15 spinal fusion. She had cardioversion, not on anticoagulation. Fall risk, unsteady. // failed DCCV // Holter 10/17: AFib, Avg HR 97, PVCs, no other arrhythmia   Chronic back pain greater than 3 months duration    a. spinal stenosis. Spinal fusion with rods in 2/15 at [facility] spinal fusion 9/15.   Chronic diastolic CHF    Echo 3/66:  EF 50-55%, trivial AI, midl MR, mod LAE, PASP 37 mmHg // Echocardiogram 03/2020: EF 45-50, no RWMA, mild LVH, mild reduced RVSF, mildly elevated  PASP (RVSP 38.3), mild LAE, mild MR, mild to mod TR, trivial AI    Circadian rhythm sleep disorder    CTS (carpal tunnel syndrome)    Deficiency anemia 11/10/2014   Depression    Diarrhea    Diverticulitis of colon    Esophageal stricture    Gastritis    Gastroesophageal hernia 07/06/2013   GERD (gastroesophageal reflux disease)    Hiatal hernia    History of blood transfusion    "most of them related to OR's"    HTN (hypertension)    Hyperlipidemia    IBS (irritable bowel syndrome)    Incontinence 10/13/2012   Insomnia    Ischemic chest pain ([place])    Memory disorder 44/02/4741   Metabolic syndrome 04/28/5637   MGUS (monoclonal gammopathy of unknown significance) dx'd 11/2014   a. Neg BMB 11/2014.   Multiple falls    Obesity    Obstructive sleep apnea    "have mask; don't wear it" (01/07/2015)   Orthostasis    PAT (paroxysmal atrial tachycardia) ([place])    Personal history of colonic polyps 10/25/2011 & 12/02/11   not retrieved Dr Lyla Son & tubular adenomas   Pneumonia 03/2014   Sinus bradycardia    a. Baseline HR 50s-60s.   Stroke [facility]) early 2000's   "small"; denies residual on 01/07/2015)    Past Surgical  History:  Procedure Laterality Date   BACK SURGERY     BONE MARROW BIOPSY  11/2014   CARDIAC CATHETERIZATION  12/27/2014   Procedure: INTRAVASCULAR PRESSURE WIRE/FFR STUDY;  Surgeon: Larey Dresser, MD;  Location: [facility] CATH LAB;  Service: Cardiovascular;;   CARDIAC CATHETERIZATION N/A 11/25/2016   Procedure: Left Heart Cath and Coronary Angiography;  Surgeon: Sherren Mocha, MD;  Location: [facility] CV LAB;  Service: Cardiovascular;  Laterality: N/A;   CARDIOVERSION N/A 02/13/2016   Procedure: CARDIOVERSION;  Surgeon: Josue Hector, MD;  Location: [place];  Service: Cardiovascular;  Laterality: N/A;   CARPAL TUNNEL RELEASE Right 1980's   CATARACT EXTRACTION Bilateral    CATARACT EXTRACTION W/ INTRAOCULAR LENS  IMPLANT,  BILATERAL  2000's   CORONARY ANGIOPLASTY WITH STENT PLACEMENT  01/07/2015   "2"   CORONARY STENT PLACEMENT     DILATION AND CURETTAGE OF UTERUS     ESOPHAGOGASTRODUODENOSCOPY (EGD) WITH ESOPHAGEAL DILATION  "several times"   JOINT REPLACEMENT     KNEE ARTHROSCOPY Left 1995   LAPAROSCOPIC CHOLECYSTECTOMY  2003   LEFT HEART CATHETERIZATION WITH CORONARY ANGIOGRAM N/A 12/27/2014   Procedure: LEFT HEART CATHETERIZATION WITH CORONARY ANGIOGRAM;  Surgeon: Larey Dresser, MD;  Location: [facility] CATH LAB;  Service: Cardiovascular;  Laterality: N/A;   PERCUTANEOUS CORONARY ROTOBLATOR INTERVENTION (PCI-R)  01/07/2015   PERCUTANEOUS CORONARY ROTOBLATOR INTERVENTION (PCI-R) N/A 01/07/2015   Procedure: PERCUTANEOUS CORONARY ROTOBLATOR INTERVENTION (PCI-R);  Surgeon: Blane Ohara, MD;  Location: [facility] CATH LAB;  Service: Cardiovascular;  Laterality: N/A;   POSTERIOR FUSION THORACIC SPINE  08/2015   POSTERIOR LUMBAR FUSION  01/2015   TOTAL KNEE ARTHROPLASTY Bilateral 1990's - 2000's    There were no vitals filed for this visit.  Subjective Assessment - 05/21/20 1707    Subjective  COVID-19 screening performed upon arrival. Patient arrives with 10/10 pain. Pt reporting "I just don't have enough energy to walk". Walking hurts and make my back pain worse.    Patient is accompained by:  Family member   daughter   Pertinent History  HTN, AFib, CAD, CHF, thoracic spinal fusion 2016, lumbar spinal fusion 2016, history of stroke    Limitations  Standing;Walking;House hold activities    How long can you stand comfortably?  short periods only    How long can you walk comfortably?  room to room    Diagnostic tests  MRI    Patient Stated Goals  decrease pain    Currently in Pain?  Yes    Pain Score  10-Worst pain ever    Pain Location  Back    Pain Orientation  Lower    Pain Descriptors / Indicators  Aching;Sore    Pain Type  Chronic pain    Pain Onset  More than a month ago                         [facility] Adult PT Treatment/Exercise - 05/21/20 0001      Exercises   Exercises  Lumbar      Lumbar Exercises: Aerobic   Nustep  L2 x 8 minutes      Lumbar Exercises: Seated   Long Arc Quad on Chair  AROM;Both;2 sets;10 reps    Other Seated Lumbar Exercises  seated marching, ball squeezes x 15 holding 5 seconds each, marching x 20 reps alternating, with intructions for core activation, hamstring curls x 5 each LE using yellow theraband    Other Seated Lumbar Exercises  seated rows using yellow theraband      Modalities   Modalities  Electrical Stimulation;Moist Heat      Moist Heat Therapy   Number Minutes Moist Heat  10 Minutes    Moist Heat Location  Lumbar Spine      Electrical Stimulation   Electrical Stimulation Location  10    Electrical Stimulation Action  IFC    Electrical Stimulation Parameters  80-150 Hz x 10 minutes    Electrical Stimulation Goals  Pain                  PT Long Term Goals - 05/21/20 1633      PT LONG TERM GOAL #1   Title  Patient will be independent with HEP    Status  On-going      PT LONG TERM GOAL #2   Title  Patient will report ability to perform ADLs with low back pain less than or equal to 4/10.    Time  6    Period  Weeks    Status  On-going      PT LONG TERM GOAL #3   Title  Patient will report ability to stand at kitchen counter with UE support for 10 mins for safe meal preparation.    Time  6    Period  Weeks    Status  On-going        I spoke to pt's daughter about a tearful episodes during session. Pt reported, "all her friends have passed and she is next". Pt's daughter reported that she tired an antidepressant medication but she only stayed on it about 3 weeks. Pt's daughter reported that she will further discuss the issue with pt's PCP.     Plan - 05/21/20 1620    Clinical Impression Statement  Pt arriving to therapy reporting 10/10 pain in her low back and exhaustion with  activity. Pt was edu in pain scale. Pt tolerating exercises well with increased time for each rep and rest breaks between each activity. Pt reporting sitting is less painful so exercises were performed in a chair. Pt edu in core activiation and intentions of furhter therapy sessions. Continue skilled PT as pt tolerates. No goals met this session.    Personal Factors and Comorbidities  Comorbidity 3+;Age    Comorbidities  HTN, AFib, CAD, CHF, thoracic spinal fusion 2016, lumbar spinal fusion 2016, history of stroke    Examination-Activity Limitations  Bed Mobility;Locomotion Level;Transfers;Stand    Stability/Clinical Decision Making  Stable/Uncomplicated    Rehab Potential  Poor    PT Frequency  2x / week    PT Duration  6 weeks    PT Treatment/Interventions  ADLs/Self Care Home Management;Cryotherapy;Electrical Stimulation;Moist Heat;Ultrasound;Gait training;Stair training;Patient/family education;Functional mobility training;Therapeutic activities;Therapeutic exercise;Balance training;Neuromuscular re-education;Manual techniques;Passive range of motion    PT Next Visit Plan  avoid supine if possible as patient is very fearful of falling. Nustep, seated exercises, STW/M to lumbar musculature, modalities PRN for pain relief    PT Home Exercise Plan  see patient education section    Consulted and Agree with Plan of Care  Patient       Patient will benefit from skilled therapeutic intervention in order to improve the following deficits and impairments:  Decreased activity tolerance, Difficulty walking, Pain, Postural dysfunction, Improper body mechanics, Decreased strength, Decreased mobility, Decreased balance  Visit Diagnosis: Chronic low back pain, unspecified back pain laterality, unspecified whether sciatica present  Difficulty in walking, not elsewhere classified  Muscle weakness (  generalized)     Problem List Patient Active Problem List   Diagnosis Date Noted   Dysphagia  03/25/2020   HOH (hard of hearing) 47/58/3074   Lichen sclerosus et atrophicus of the vulva 01/03/2019   Chronic pain syndrome 02/14/2018   Long-term current use of opiate analgesic 02/14/2018   Anxiety 07/21/2016   Chronic diastolic CHF (congestive heart failure) ([place]) 07/17/2015   DDD (degenerative disc disease), lumbar 03/22/2015   Fall 03/22/2015   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   Difficulty hearing 03/22/2015   Lumbar scoliosis 03/22/2015   History of pelvic fracture 03/22/2015   History of other specified conditions presenting hazards to health 03/22/2015   MGUS (monoclonal gammopathy of unknown significance) 01/08/2015   Permanent atrial fibrillation ([place]) 12/22/2014   CAD (coronary artery disease) 12/22/2014   Memory disorder 12/04/2014   Angulation of spine 60/01/9846   Metabolic syndrome 30/85/6943   S/P spinal fusion 02/23/2014   Chronic pain associated with significant psychosocial dysfunction 02/23/2014   Gastroesophageal hernia 07/06/2013   Incontinence 10/13/2012   Disaccharide malabsorption 11/24/2011   DEGENERATIVE JOINT DISEASE 04/08/2010   Obesity (BMI 30.0-34.9) 04/04/2009   CIRCADIAN RHYTHM SLEEP D/O DELAY SLEEP PHSE TYPE 04/04/2009   CARPAL TUNNEL SYNDROME 04/04/2009   IRRITABLE BOWEL SYNDROME 11/13/2008   ESOPHAGEAL STRICTURE 11/12/2008   GERD 11/12/2008   Paralysis of diaphragm 11/12/2008   DIVERTICULOSIS, COLON 11/12/2008   Obstructive sleep apnea 05/23/2008   Hyperlipidemia 05/22/2008   Depression 05/22/2008   Essential hypertension 05/22/2008   INSOMNIA 05/22/2008    Oretha Caprice, PT, MPT 05/21/2020, 5:41 PM  [facility] [place], [place], [address] Phone: [phone]   Fax:  [phone]  Name: [name] [name] MRN: [id number] Date of Birth: 1941-05-21

## 2020-05-22 ENCOUNTER — Telehealth (HOSPITAL_COMMUNITY): Payer: Self-pay | Admitting: *Deleted

## 2020-05-22 NOTE — Telephone Encounter (Signed)
Patient given detailed instructions per Myocardial Perfusion Study Information Sheet for the test on 05/22/20. Patient notified to arrive 15 minutes early and that it is imperative to arrive on time for appointment to keep from having the test rescheduled.  If you need to cancel or reschedule your appointment, please call the office within 24 hours of your appointment. . Patient verbalized understanding. Kirstie Peri

## 2020-05-23 ENCOUNTER — Encounter: Payer: Self-pay | Admitting: Physical Therapy

## 2020-05-23 ENCOUNTER — Ambulatory Visit: Payer: Medicare HMO | Admitting: Physical Therapy

## 2020-05-23 ENCOUNTER — Other Ambulatory Visit: Payer: Self-pay

## 2020-05-23 DIAGNOSIS — R262 Difficulty in walking, not elsewhere classified: Secondary | ICD-10-CM

## 2020-05-23 DIAGNOSIS — M545 Low back pain, unspecified: Secondary | ICD-10-CM

## 2020-05-23 DIAGNOSIS — M6281 Muscle weakness (generalized): Secondary | ICD-10-CM

## 2020-05-23 DIAGNOSIS — G8929 Other chronic pain: Secondary | ICD-10-CM

## 2020-05-23 NOTE — Therapy (Signed)
[facility] [place], [place], [address] Phone: [phone]   Fax:  [phone]  Physical Therapy Treatment  Patient Details  Name: Mckenize Mezera Bellomo MRN: [id number] Date of Birth: Dec 04, 1941 Referring Provider (PT): Levy Pupa, [place]   Encounter Date: 05/23/2020  PT End of Session - 05/23/20 1114    Visit Number  4    Number of Visits  12    Date for PT Re-Evaluation  06/17/20    Authorization Type  Progress note every 10th visit; KX modifier at 15th visit    PT Start Time  1117    PT Stop Time  1205    PT Time Calculation (min)  48 min    Equipment Utilized During Treatment  Other (comment)   Rollator   Activity Tolerance  Patient tolerated treatment well    Behavior During Therapy  [facility] for tasks assessed/performed       Past Medical History:  Diagnosis Date  . Abnormal nuclear cardiac imaging test   . Anemia   . Anemia, iron deficiency 07/02/2015  . Anxiety   . Arthritis    "knees, back, fingers, toes; joints" (01/07/2015)  . 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 CAD (coronary artery disease)    a. Abnl nuc 11/2014. Cath 12/2014 - turned down for CABG. Ultimately s/p TTVP, rotational atherectomy, PTCA and stenting of the ostial LCx and left main into the LAD (crush technique), and IVUS of the LAD/Left main. // b. Myoview 11/17: EF 48, poor quality/significant artifact; inf-lateral, inferior ischemia; Intermediate Risk  . Chronic atrial fibrillation ([place])    a.  First noted post-op 9/15 spinal fusion. She had cardioversion, not on anticoagulation. Fall risk, unsteady. // failed DCCV // Holter 10/17: AFib, Avg HR 97, PVCs, no other arrhythmia  . Chronic back pain greater than 3 months duration    a. spinal stenosis. Spinal fusion with rods in 2/15 at [facility] spinal fusion 9/15.  Marland Kitchen Chronic diastolic CHF    Echo 4/85:  EF 50-55%, trivial AI, midl MR, mod LAE, PASP 37 mmHg // Echocardiogram 03/2020: EF 45-50, no RWMA,  mild LVH, mild reduced RVSF, mildly elevated PASP (RVSP 38.3), mild LAE, mild MR, mild to mod TR, trivial AI   . Circadian rhythm sleep disorder   . CTS (carpal tunnel syndrome)   . Deficiency anemia 11/10/2014  . Depression   . Diarrhea   . Diverticulitis of colon   . Esophageal stricture   . Gastritis   . Gastroesophageal hernia 07/06/2013  . GERD (gastroesophageal reflux disease)   . Hiatal hernia   . History of blood transfusion    "most of them related to OR's"   . HTN (hypertension)   . Hyperlipidemia   . IBS (irritable bowel syndrome)   . Incontinence 10/13/2012  . Insomnia   . Ischemic chest pain ([place])   . Memory disorder 12/04/2014  . Metabolic syndrome 03/26/2702  . MGUS (monoclonal gammopathy of unknown significance) dx'd 11/2014   a. Neg BMB 11/2014.  . Multiple falls   . Obesity   . Obstructive sleep apnea    "have mask; don't wear it" (01/07/2015)  . Orthostasis   . PAT (paroxysmal atrial tachycardia) ([place])   . Personal history of colonic polyps 10/25/2011 & 12/02/11   not retrieved Dr Lyla Son & tubular adenomas  . Pneumonia 03/2014  . Sinus bradycardia    a. Baseline HR 50s-60s.  . Stroke [facility]) early 2000's   "small"; denies residual on 01/07/2015)  Past Surgical History:  Procedure Laterality Date  . BACK SURGERY    . BONE MARROW BIOPSY  11/2014  . CARDIAC CATHETERIZATION  12/27/2014   Procedure: INTRAVASCULAR PRESSURE WIRE/FFR STUDY;  Surgeon: Larey Dresser, MD;  Location: [facility] CATH LAB;  Service: Cardiovascular;;  . CARDIAC CATHETERIZATION N/A 11/25/2016   Procedure: Left Heart Cath and Coronary Angiography;  Surgeon: Sherren Mocha, MD;  Location: [place] CV LAB;  Service: Cardiovascular;  Laterality: N/A;  . CARDIOVERSION N/A 02/13/2016   Procedure: CARDIOVERSION;  Surgeon: Josue Hector, MD;  Location: [facility] ENDOSCOPY;  Service: Cardiovascular;  Laterality: N/A;  . CARPAL TUNNEL RELEASE Right 1980's  . CATARACT EXTRACTION Bilateral   . CATARACT  EXTRACTION W/ INTRAOCULAR LENS  IMPLANT, BILATERAL  2000's  . CORONARY ANGIOPLASTY WITH STENT PLACEMENT  01/07/2015   "2"  . CORONARY STENT PLACEMENT    . DILATION AND CURETTAGE OF UTERUS    . ESOPHAGOGASTRODUODENOSCOPY (EGD) WITH ESOPHAGEAL DILATION  "several times"  . JOINT REPLACEMENT    . KNEE ARTHROSCOPY Left 1995  . LAPAROSCOPIC CHOLECYSTECTOMY  2003  . LEFT HEART CATHETERIZATION WITH CORONARY ANGIOGRAM N/A 12/27/2014   Procedure: LEFT HEART CATHETERIZATION WITH CORONARY ANGIOGRAM;  Surgeon: Larey Dresser, MD;  Location: [facility] CATH LAB;  Service: Cardiovascular;  Laterality: N/A;  . PERCUTANEOUS CORONARY ROTOBLATOR INTERVENTION (PCI-R)  01/07/2015  . PERCUTANEOUS CORONARY ROTOBLATOR INTERVENTION (PCI-R) N/A 01/07/2015   Procedure: PERCUTANEOUS CORONARY ROTOBLATOR INTERVENTION (PCI-R);  Surgeon: Blane Ohara, MD;  Location: [facility] CATH LAB;  Service: Cardiovascular;  Laterality: N/A;  . POSTERIOR FUSION THORACIC SPINE  08/2015  . POSTERIOR LUMBAR FUSION  01/2015  . TOTAL KNEE ARTHROPLASTY Bilateral 1990's - 2000's    There were no vitals filed for this visit.  Subjective Assessment - 05/23/20 1113    Subjective  COVID 19 screening performed on patient upon arrival. Reports that she may feel a tiny better but still has a lot of pain in low back and L side. Patient states that she is afraid nothing can be done for LBP.    Patient is accompained by:  Family member   Daughter   Pertinent History  HTN, AFib, CAD, CHF, thoracic spinal fusion 2016, lumbar spinal fusion 2016, history of stroke    Limitations  Standing;Walking;House hold activities    How long can you stand comfortably?  short periods only    How long can you walk comfortably?  room to room    Diagnostic tests  MRI    Patient Stated Goals  decrease pain    Currently in Pain?  Yes    Pain Score  9     Pain Location  Back    Pain Orientation  Lower    Pain Descriptors / Indicators  Discomfort    Pain Type  Chronic pain    Pain  Onset  More than a month ago    Pain Frequency  Constant         [facility] PT Assessment - 05/23/20 0001      Assessment   Medical Diagnosis  Spinal stenosis of lumbar region without neurogenic claudication    Referring Provider (PT)  Levy Pupa, PA    Next MD Visit  not made    Prior Therapy  no      Precautions   Precautions  Fall    Precaution Comments  VERY FEARFUL OF FALLING; AVOID SUPINE IF POSSIBLE.  [facility] Adult PT Treatment/Exercise - 05/23/20 0001      Lumbar Exercises: Aerobic   Nustep  L2 x12 min      Lumbar Exercises: Seated   Long Arc Quad on Chair  Strengthening;Both;2 sets;10 reps;Weights    LAQ on Chair Weights (lbs)  3    Hip Flexion on Ball  AROM;Both;10 reps   reported LBP   Other Seated Lumbar Exercises  Seated HS curl, clam yellow theraband x20 reps each    Other Seated Lumbar Exercises  seated rows using yellow theraband x20 reps; core crunch x10 reps 5 sec      Modalities   Modalities  Electrical Stimulation;Moist Heat      Moist Heat Therapy   Number Minutes Moist Heat  10 Minutes    Moist Heat Location  Lumbar Spine      Electrical Stimulation   Electrical Stimulation Location  L lumbar     Electrical Stimulation Action  IFC    Electrical Stimulation Parameters  80-150 hz x10 min    Electrical Stimulation Goals  Pain                  PT Long Term Goals - 05/21/20 1633      PT LONG TERM GOAL #1   Title  Patient will be independent with HEP    Status  On-going      PT LONG TERM GOAL #2   Title  Patient will report ability to perform ADLs with low back pain less than or equal to 4/10.    Time  6    Period  Weeks    Status  On-going      PT LONG TERM GOAL #3   Title  Patient will report ability to stand at kitchen counter with UE support for 10 mins for safe meal preparation.    Time  6    Period  Weeks    Status  On-going            Plan - 05/23/20 1201    Clinical Impression  Statement  Patient presented in clinic with reports of continued high level L LBP and very concerned of pain and activity levels. Patient able to tolerate exercises fairly well although increased pain with seated hip flexion. Patient's B HS stretched with greater stretch noted in LLE. Patient heavily encouraged throughout treatment to continue HEP daily and be more active. Normal modalities response noted following removal of the modalities.    Personal Factors and Comorbidities  Comorbidity 3+;Age    Comorbidities  HTN, AFib, CAD, CHF, thoracic spinal fusion 2016, lumbar spinal fusion 2016, history of stroke    Examination-Activity Limitations  Bed Mobility;Locomotion Level;Transfers;Stand    Examination-Participation Restrictions  Meal Prep    Stability/Clinical Decision Making  Stable/Uncomplicated    Rehab Potential  Poor    PT Frequency  2x / week    PT Duration  6 weeks    PT Treatment/Interventions  ADLs/Self Care Home Management;Cryotherapy;Electrical Stimulation;Moist Heat;Ultrasound;Gait training;Stair training;Patient/family education;Functional mobility training;Therapeutic activities;Therapeutic exercise;Balance training;Neuromuscular re-education;Manual techniques;Passive range of motion    PT Next Visit Plan  avoid supine if possible as patient is very fearful of falling. Nustep, seated exercises, STW/M to lumbar musculature, modalities PRN for pain relief    PT Home Exercise Plan  see patient education section    Consulted and Agree with Plan of Care  Patient       Patient will benefit from skilled therapeutic intervention in order to improve the  following deficits and impairments:  Decreased activity tolerance, Difficulty walking, Pain, Postural dysfunction, Improper body mechanics, Decreased strength, Decreased mobility, Decreased balance  Visit Diagnosis: Chronic low back pain, unspecified back pain laterality, unspecified whether sciatica present  Difficulty in walking, not  elsewhere classified  Muscle weakness (generalized)     Problem List Patient Active Problem List   Diagnosis Date Noted  . Dysphagia 03/25/2020  . HOH (hard of hearing) 03/25/2020  . Lichen sclerosus et atrophicus of the vulva 01/03/2019  . Chronic pain syndrome 02/14/2018  . Long-term current use of opiate analgesic 02/14/2018  . Anxiety 07/21/2016  . Chronic diastolic CHF (congestive heart failure) ([place]) 07/17/2015  . DDD (degenerative disc disease), lumbar 03/22/2015  . Fall 03/22/2015  . 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 Difficulty hearing 03/22/2015  . Lumbar scoliosis 03/22/2015  . History of pelvic fracture 03/22/2015  . History of other specified conditions presenting hazards to health 03/22/2015  . MGUS (monoclonal gammopathy of unknown significance) 01/08/2015  . Permanent atrial fibrillation ([place]) 12/22/2014  . CAD (coronary artery disease) 12/22/2014  . Memory disorder 12/04/2014  . Angulation of spine 09/18/2014  . Metabolic syndrome 64/38/3818  . S/P spinal fusion 02/23/2014  . Chronic pain associated with significant psychosocial dysfunction 02/23/2014  . Gastroesophageal hernia 07/06/2013  . Incontinence 10/13/2012  . 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 DEGENERATIVE JOINT DISEASE 04/08/2010  . Obesity (BMI 30.0-34.9) 04/04/2009  . CIRCADIAN RHYTHM SLEEP D/O DELAY SLEEP PHSE TYPE 04/04/2009  . CARPAL TUNNEL SYNDROME 04/04/2009  . IRRITABLE BOWEL SYNDROME 11/13/2008  . ESOPHAGEAL STRICTURE 11/12/2008  . GERD 11/12/2008  . Paralysis of diaphragm 11/12/2008  . DIVERTICULOSIS, COLON 11/12/2008  . Obstructive sleep apnea 05/23/2008  . Hyperlipidemia 05/22/2008  . Depression 05/22/2008  . Essential hypertension 05/22/2008  . INSOMNIA 05/22/2008    Standley Brooking, PTA 05/23/2020, 12:15 PM  [facility] [address] [place], [place], [address] Phone: [phone]   Fax:  [phone]  Name: [place] Droll  [place] MRN: [id number] Date of Birth: 1941-02-25

## 2020-05-28 ENCOUNTER — Ambulatory Visit: Payer: Medicare HMO | Admitting: Physical Therapy

## 2020-05-28 ENCOUNTER — Encounter: Payer: Self-pay | Admitting: Physical Therapy

## 2020-05-28 ENCOUNTER — Other Ambulatory Visit: Payer: Self-pay

## 2020-05-28 DIAGNOSIS — M545 Low back pain, unspecified: Secondary | ICD-10-CM

## 2020-05-28 DIAGNOSIS — M6281 Muscle weakness (generalized): Secondary | ICD-10-CM

## 2020-05-28 DIAGNOSIS — G8929 Other chronic pain: Secondary | ICD-10-CM

## 2020-05-28 DIAGNOSIS — R262 Difficulty in walking, not elsewhere classified: Secondary | ICD-10-CM

## 2020-05-28 NOTE — Therapy (Signed)
[facility] [place], [place], [address] Phone: [phone]   Fax:  [phone]  Physical Therapy Treatment  Patient Details  Name: [name] [name] MRN: [id number] Date of Birth: [birth year] Referring Provider (PT): Levy Pupa, [place]   Encounter Date: 05/28/2020  PT End of Session - 05/28/20 1340    Visit Number  5    Number of Visits  12    Date for PT Re-Evaluation  06/17/20    Authorization Type  Progress note every 10th visit; KX modifier at 15th visit    PT Start Time  1300    PT Stop Time  1348    PT Time Calculation (min)  48 min    Equipment Utilized During Treatment  Other (comment)   rollator   Activity Tolerance  Patient tolerated treatment well    Behavior During Therapy  [facility] for tasks assessed/performed       Past Medical History:  Diagnosis Date  . Abnormal nuclear cardiac imaging test   . Anemia   . Anemia, iron deficiency 07/02/2015  . Anxiety   . Arthritis    "knees, back, fingers, toes; joints" (01/07/2015)  . 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 CAD (coronary artery disease)    a. Abnl nuc 11/2014. Cath 12/2014 - turned down for CABG. Ultimately s/p TTVP, rotational atherectomy, PTCA and stenting of the ostial LCx and left main into the LAD (crush technique), and IVUS of the LAD/Left main. // b. Myoview 11/17: EF 48, poor quality/significant artifact; inf-lateral, inferior ischemia; Intermediate Risk  . Chronic atrial fibrillation ([place])    a.  First noted post-op 9/15 spinal fusion. She had cardioversion, not on anticoagulation. Fall risk, unsteady. // failed DCCV // Holter 10/17: AFib, Avg HR 97, PVCs, no other arrhythmia  . Chronic back pain greater than 3 months duration    a. spinal stenosis. Spinal fusion with rods in 2/15 at [facility] spinal fusion 9/15.  Marland Kitchen Chronic diastolic CHF    Echo 1/21:  EF 50-55%, trivial AI, midl MR, mod LAE, PASP 37 mmHg // Echocardiogram 03/2020: EF 45-50, no RWMA,  mild LVH, mild reduced RVSF, mildly elevated PASP (RVSP 38.3), mild LAE, mild MR, mild to mod TR, trivial AI   . Circadian rhythm sleep disorder   . CTS (carpal tunnel syndrome)   . Deficiency anemia 11/10/2014  . Depression   . Diarrhea   . Diverticulitis of colon   . Esophageal stricture   . Gastritis   . Gastroesophageal hernia 07/06/2013  . GERD (gastroesophageal reflux disease)   . Hiatal hernia   . History of blood transfusion    "most of them related to OR's"   . HTN (hypertension)   . Hyperlipidemia   . IBS (irritable bowel syndrome)   . Incontinence 10/13/2012  . Insomnia   . Ischemic chest pain ([place])   . Memory disorder 12/04/2014  . Metabolic syndrome 08/27/5882  . MGUS (monoclonal gammopathy of unknown significance) dx'd 11/2014   a. Neg BMB 11/2014.  . Multiple falls   . Obesity   . Obstructive sleep apnea    "have mask; don't wear it" (01/07/2015)  . Orthostasis   . PAT (paroxysmal atrial tachycardia) ([place])   . Personal history of colonic polyps 10/25/2011 & 12/02/11   not retrieved Dr Lyla Son & tubular adenomas  . Pneumonia 03/2014  . Sinus bradycardia    a. Baseline HR 50s-60s.  . Stroke [facility]) early 2000's   "small"; denies residual on 01/07/2015)  Past Surgical History:  Procedure Laterality Date  . BACK SURGERY    . BONE MARROW BIOPSY  11/2014  . CARDIAC CATHETERIZATION  12/27/2014   Procedure: INTRAVASCULAR PRESSURE WIRE/FFR STUDY;  Surgeon: Larey Dresser, MD;  Location: [facility] CATH LAB;  Service: Cardiovascular;;  . CARDIAC CATHETERIZATION N/A 11/25/2016   Procedure: Left Heart Cath and Coronary Angiography;  Surgeon: Sherren Mocha, MD;  Location: [facility] CV LAB;  Service: Cardiovascular;  Laterality: N/A;  . CARDIOVERSION N/A 02/13/2016   Procedure: CARDIOVERSION;  Surgeon: Josue Hector, MD;  Location: [facility] ENDOSCOPY;  Service: Cardiovascular;  Laterality: N/A;  . CARPAL TUNNEL RELEASE Right 1980's  . CATARACT EXTRACTION Bilateral   . CATARACT  EXTRACTION W/ INTRAOCULAR LENS  IMPLANT, BILATERAL  2000's  . CORONARY ANGIOPLASTY WITH STENT PLACEMENT  01/07/2015   "2"  . CORONARY STENT PLACEMENT    . DILATION AND CURETTAGE OF UTERUS    . ESOPHAGOGASTRODUODENOSCOPY (EGD) WITH ESOPHAGEAL DILATION  "several times"  . JOINT REPLACEMENT    . KNEE ARTHROSCOPY Left 1995  . LAPAROSCOPIC CHOLECYSTECTOMY  2003  . LEFT HEART CATHETERIZATION WITH CORONARY ANGIOGRAM N/A 12/27/2014   Procedure: LEFT HEART CATHETERIZATION WITH CORONARY ANGIOGRAM;  Surgeon: Larey Dresser, MD;  Location: [facility] CATH LAB;  Service: Cardiovascular;  Laterality: N/A;  . PERCUTANEOUS CORONARY ROTOBLATOR INTERVENTION (PCI-R)  01/07/2015  . PERCUTANEOUS CORONARY ROTOBLATOR INTERVENTION (PCI-R) N/A 01/07/2015   Procedure: PERCUTANEOUS CORONARY ROTOBLATOR INTERVENTION (PCI-R);  Surgeon: Blane Ohara, MD;  Location: [facility] CATH LAB;  Service: Cardiovascular;  Laterality: N/A;  . POSTERIOR FUSION THORACIC SPINE  08/2015  . POSTERIOR LUMBAR FUSION  01/2015  . TOTAL KNEE ARTHROPLASTY Bilateral 1990's - 2000's    There were no vitals filed for this visit.  Subjective Assessment - 05/28/20 1331    Subjective  COVID 19 screening performed on patient upon arrival. Patient arrives with 7/10 pain with more soreness in L hip left low back.    Pertinent History  HTN, AFib, CAD, CHF, thoracic spinal fusion 2016, lumbar spinal fusion 2016, history of stroke    Limitations  Standing;Walking;House hold activities    How long can you stand comfortably?  short periods only    How long can you walk comfortably?  room to room    Diagnostic tests  MRI    Patient Stated Goals  decrease pain    Currently in Pain?  Yes    Pain Score  7     Pain Location  Back    Pain Orientation  Lower;Left    Pain Descriptors / Indicators  Sore;Aching    Pain Type  Chronic pain    Pain Onset  More than a month ago    Pain Frequency  Constant         [facility] PT Assessment - 05/28/20 0001      Assessment    Medical Diagnosis  Spinal stenosis of lumbar region without neurogenic claudication    Referring Provider (PT)  Levy Pupa, PA    Next MD Visit  not made    Prior Therapy  no      Precautions   Precautions  Fall    Precaution Comments  VERY FEARFUL OF FALLING; AVOID SUPINE IF POSSIBLE.                    [facility] Adult PT Treatment/Exercise - 05/28/20 0001      Lumbar Exercises: Aerobic   Nustep  L2 x12 min  Lumbar Exercises: Seated   Long Arc Quad on Chair  Strengthening;Both;2 sets;10 reps;Weights    LAQ on Chair Limitations  with ball squeeze    Hip Flexion on Ball  AROM;Both;20 reps    Sit to Stand  10 reps    Sit to Stand Limitations  no UE support; on elevated plinth with rollator in front for safety    Other Seated Lumbar Exercises  Seated HS curl, clam yellow theraband x2 mins each    Other Seated Lumbar Exercises  forward reaching with silver ball x20      Modalities   Modalities  Electrical Stimulation;Moist Heat      Moist Heat Therapy   Number Minutes Moist Heat  10 Minutes    Moist Heat Location  Lumbar Spine      Electrical Stimulation   Electrical Stimulation Location  L lumbar     Electrical Stimulation Action  IFC    Electrical Stimulation Parameters  80-150 hz x10 mins    Electrical Stimulation Goals  Pain                  PT Long Term Goals - 05/21/20 1633      PT LONG TERM GOAL #1   Title  Patient will be independent with HEP    Status  On-going      PT LONG TERM GOAL #2   Title  Patient will report ability to perform ADLs with low back pain less than or equal to 4/10.    Time  6    Period  Weeks    Status  On-going      PT LONG TERM GOAL #3   Title  Patient will report ability to stand at kitchen counter with UE support for 10 mins for safe meal preparation.    Time  6    Period  Weeks    Status  On-going            Plan - 05/28/20 1340    Clinical Impression Statement  Patient responded fairly to  therapy session. Patient still with ongoing discomfort which requires intermittent rest breaks to decrease pain. Patient reports not being consistent with HEP and patient educated on the importance of performing to maximize PT benefit. Patient reported understanding.    Personal Factors and Comorbidities  Comorbidity 3+;Age    Comorbidities  HTN, AFib, CAD, CHF, thoracic spinal fusion 2016, lumbar spinal fusion 2016, history of stroke    Examination-Activity Limitations  Bed Mobility;Locomotion Level;Transfers;Stand    Examination-Participation Restrictions  Meal Prep    Stability/Clinical Decision Making  Stable/Uncomplicated    Clinical Decision Making  Low    Rehab Potential  Poor    PT Frequency  2x / week    PT Duration  6 weeks    PT Treatment/Interventions  ADLs/Self Care Home Management;Cryotherapy;Electrical Stimulation;Moist Heat;Ultrasound;Gait training;Stair training;Patient/family education;Functional mobility training;Therapeutic activities;Therapeutic exercise;Balance training;Neuromuscular re-education;Manual techniques;Passive range of motion    PT Next Visit Plan  avoid supine if possible as patient is very fearful of falling. Nustep, seated exercises, STW/M to lumbar musculature, modalities PRN for pain relief    PT Home Exercise Plan  see patient education section    Consulted and Agree with Plan of Care  Patient       Patient will benefit from skilled therapeutic intervention in order to improve the following deficits and impairments:  Decreased activity tolerance, Difficulty walking, Pain, Postural dysfunction, Improper body mechanics, Decreased strength, Decreased mobility, Decreased balance  Visit  Diagnosis: Chronic low back pain, unspecified back pain laterality, unspecified whether sciatica present  Difficulty in walking, not elsewhere classified  Muscle weakness (generalized)     Problem List Patient Active Problem List   Diagnosis Date Noted  . Dysphagia  03/25/2020  . HOH (hard of hearing) 03/25/2020  . Lichen sclerosus et atrophicus of the vulva 01/03/2019  . Chronic pain syndrome 02/14/2018  . Long-term current use of opiate analgesic 02/14/2018  . Anxiety 07/21/2016  . Chronic diastolic CHF (congestive heart failure) ([place]) 07/17/2015  . DDD (degenerative disc disease), lumbar 03/22/2015  . Fall 03/22/2015  . 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 Difficulty hearing 03/22/2015  . Lumbar scoliosis 03/22/2015  . History of pelvic fracture 03/22/2015  . History of other specified conditions presenting hazards to health 03/22/2015  . MGUS (monoclonal gammopathy of unknown significance) 01/08/2015  . Permanent atrial fibrillation ([place]) 12/22/2014  . CAD (coronary artery disease) 12/22/2014  . Memory disorder 12/04/2014  . Angulation of spine 09/18/2014  . Metabolic syndrome 10/19/1313  . S/P spinal fusion 02/23/2014  . Chronic pain associated with significant psychosocial dysfunction 02/23/2014  . Gastroesophageal hernia 07/06/2013  . Incontinence 10/13/2012  . 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 DEGENERATIVE JOINT DISEASE 04/08/2010  . Obesity (BMI 30.0-34.9) 04/04/2009  . CIRCADIAN RHYTHM SLEEP D/O DELAY SLEEP PHSE TYPE 04/04/2009  . CARPAL TUNNEL SYNDROME 04/04/2009  . IRRITABLE BOWEL SYNDROME 11/13/2008  . ESOPHAGEAL STRICTURE 11/12/2008  . GERD 11/12/2008  . Paralysis of diaphragm 11/12/2008  . DIVERTICULOSIS, COLON 11/12/2008  . Obstructive sleep apnea 05/23/2008  . Hyperlipidemia 05/22/2008  . Depression 05/22/2008  . Essential hypertension 05/22/2008  . INSOMNIA 05/22/2008    Gabriela Eves, PT, DPT 05/28/2020, 2:03 PM  [facility] [address] [place], [place], [address] Phone: [phone]   Fax:  [phone]  Name: Keniah Klemmer Vanvranken MRN: [id number] Date of Birth: [birth year]

## 2020-05-29 ENCOUNTER — Ambulatory Visit (HOSPITAL_COMMUNITY): Payer: Medicare HMO | Attending: Cardiovascular Disease

## 2020-05-29 VITALS — Ht 67.0 in | Wt 185.0 lb

## 2020-05-29 DIAGNOSIS — I25119 Atherosclerotic heart disease of native coronary artery with unspecified angina pectoris: Secondary | ICD-10-CM | POA: Insufficient documentation

## 2020-05-29 DIAGNOSIS — I5032 Chronic diastolic (congestive) heart failure: Secondary | ICD-10-CM | POA: Diagnosis present

## 2020-05-29 DIAGNOSIS — R110 Nausea: Secondary | ICD-10-CM

## 2020-05-29 DIAGNOSIS — R0602 Shortness of breath: Secondary | ICD-10-CM | POA: Diagnosis not present

## 2020-05-29 DIAGNOSIS — I4821 Permanent atrial fibrillation: Secondary | ICD-10-CM

## 2020-05-29 LAB — MYOCARDIAL PERFUSION IMAGING
LV dias vol: 64 mL (ref 46–106)
LV sys vol: 36 mL
Peak HR: 118 {beats}/min
Rest HR: 86 {beats}/min
SDS: 1
SRS: 0
SSS: 1
TID: 1.01

## 2020-05-29 MED ORDER — REGADENOSON 0.4 MG/5ML IV SOLN
0.4000 mg | Freq: Once | INTRAVENOUS | Status: AC
  Administered 2020-05-29: 0.4 mg via INTRAVENOUS

## 2020-05-29 MED ORDER — TECHNETIUM TC 99M TETROFOSMIN IV KIT
31.1000 | PACK | Freq: Once | INTRAVENOUS | Status: AC | PRN
  Administered 2020-05-29: 31.1 via INTRAVENOUS
  Filled 2020-05-29: qty 32

## 2020-05-29 MED ORDER — AMINOPHYLLINE 25 MG/ML IV SOLN
150.0000 mg | Freq: Once | INTRAVENOUS | Status: AC
Start: 2020-05-29 — End: 2020-05-29
  Administered 2020-05-29: 150 mg via INTRAVENOUS

## 2020-05-29 MED ORDER — TECHNETIUM TC 99M TETROFOSMIN IV KIT
10.4000 | PACK | Freq: Once | INTRAVENOUS | Status: AC | PRN
  Administered 2020-05-29: 10.4 via INTRAVENOUS
  Filled 2020-05-29: qty 11

## 2020-05-30 ENCOUNTER — Other Ambulatory Visit: Payer: Self-pay

## 2020-05-30 ENCOUNTER — Ambulatory Visit: Payer: Medicare HMO | Admitting: Physical Therapy

## 2020-05-30 DIAGNOSIS — R262 Difficulty in walking, not elsewhere classified: Secondary | ICD-10-CM

## 2020-05-30 DIAGNOSIS — M545 Low back pain, unspecified: Secondary | ICD-10-CM

## 2020-05-30 DIAGNOSIS — G8929 Other chronic pain: Secondary | ICD-10-CM

## 2020-05-30 DIAGNOSIS — M6281 Muscle weakness (generalized): Secondary | ICD-10-CM

## 2020-05-30 NOTE — Therapy (Addendum)
[facility] [place], [place], [address] Phone: [phone]   Fax:  [phone]  Physical Therapy Treatment PHYSICAL THERAPY DISCHARGE SUMMARY  Visits from Start of Care: 6  Current functional level related to goals / functional outcomes: See below   Remaining deficits: See goals   Education / Equipment: HEP Plan: Patient agrees to discharge.  Patient goals were not met. Patient is being discharged due to the patient's request.  ?????  Patient requested to be DC'ed   Patient Details  Name: [name] [name] MRN: [id number] Date of Birth: April 15, 1941 Referring Provider (PT): Levy Pupa, [place]   Encounter Date: 05/30/2020   PT End of Session - 05/30/20 1219    Visit Number 6    Number of Visits 12    Date for PT Re-Evaluation 06/17/20    Authorization Type Progress note every 10th visit; KX modifier at 15th visit    PT Start Time 1115    PT Stop Time 1204    PT Time Calculation (min) 49 min    Activity Tolerance Patient tolerated treatment well    Behavior During Therapy [facility] for tasks assessed/performed           Past Medical History:  Diagnosis Date  . Abnormal nuclear cardiac imaging test   . Anemia   . Anemia, iron deficiency 07/02/2015  . Anxiety   . Arthritis    "knees, back, fingers, toes; joints" (01/07/2015)  . 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 CAD (coronary artery disease)    a. Abnl nuc 11/2014. Cath 12/2014 - turned down for CABG. Ultimately s/p TTVP, rotational atherectomy, PTCA and stenting of the ostial LCx and left main into the LAD (crush technique), and IVUS of the LAD/Left main. // b. Myoview 11/17: EF 48, poor quality/significant artifact; inf-lateral, inferior ischemia; Intermediate Risk  . Chronic atrial fibrillation ([place])    a.  First noted post-op 9/15 spinal fusion. She had cardioversion, not on anticoagulation. Fall risk, unsteady. // failed DCCV // Holter 10/17: AFib, Avg HR 97, PVCs, no  other arrhythmia  . Chronic back pain greater than 3 months duration    a. spinal stenosis. Spinal fusion with rods in 2/15 at [facility] spinal fusion 9/15.  Marland Kitchen Chronic diastolic CHF    Echo 9/76:  EF 50-55%, trivial AI, midl MR, mod LAE, PASP 37 mmHg // Echocardiogram 03/2020: EF 45-50, no RWMA, mild LVH, mild reduced RVSF, mildly elevated PASP (RVSP 38.3), mild LAE, mild MR, mild to mod TR, trivial AI   . Circadian rhythm sleep disorder   . CTS (carpal tunnel syndrome)   . Deficiency anemia 11/10/2014  . Depression   . Diarrhea   . Diverticulitis of colon   . Esophageal stricture   . Gastritis   . Gastroesophageal hernia 07/06/2013  . GERD (gastroesophageal reflux disease)   . Hiatal hernia   . History of blood transfusion    "most of them related to OR's"   . HTN (hypertension)   . Hyperlipidemia   . IBS (irritable bowel syndrome)   . Incontinence 10/13/2012  . Insomnia   . Ischemic chest pain ([place])   . Memory disorder 12/04/2014  . Metabolic syndrome 06/22/4192  . MGUS (monoclonal gammopathy of unknown significance) dx'd 11/2014   a. Neg BMB 11/2014.  . Multiple falls   . Obesity   . Obstructive sleep apnea    "have mask; don't wear it" (01/07/2015)  . Orthostasis   . PAT (paroxysmal atrial tachycardia) ([place])   .  Personal history of colonic polyps 10/25/2011 & 12/02/11   not retrieved Dr Lyla Son & tubular adenomas  . Pneumonia 03/2014  . Sinus bradycardia    a. Baseline HR 50s-60s.  . Stroke [facility]) early 2000's   "small"; denies residual on 01/07/2015)    Past Surgical History:  Procedure Laterality Date  . BACK SURGERY    . BONE MARROW BIOPSY  11/2014  . CARDIAC CATHETERIZATION  12/27/2014   Procedure: INTRAVASCULAR PRESSURE WIRE/FFR STUDY;  Surgeon: Larey Dresser, MD;  Location: [facility] CATH LAB;  Service: Cardiovascular;;  . CARDIAC CATHETERIZATION N/A 11/25/2016   Procedure: Left Heart Cath and Coronary Angiography;  Surgeon: Sherren Mocha, MD;  Location: [facility] CV LAB;  Service: Cardiovascular;  Laterality: N/A;  . CARDIOVERSION N/A 02/13/2016   Procedure: CARDIOVERSION;  Surgeon: Josue Hector, MD;  Location: [facility] ENDOSCOPY;  Service: Cardiovascular;  Laterality: N/A;  . CARPAL TUNNEL RELEASE Right 1980's  . CATARACT EXTRACTION Bilateral   . CATARACT EXTRACTION W/ INTRAOCULAR LENS  IMPLANT, BILATERAL  2000's  . CORONARY ANGIOPLASTY WITH STENT PLACEMENT  01/07/2015   "2"  . CORONARY STENT PLACEMENT    . DILATION AND CURETTAGE OF UTERUS    . ESOPHAGOGASTRODUODENOSCOPY (EGD) WITH ESOPHAGEAL DILATION  "several times"  . JOINT REPLACEMENT    . KNEE ARTHROSCOPY Left 1995  . LAPAROSCOPIC CHOLECYSTECTOMY  2003  . LEFT HEART CATHETERIZATION WITH CORONARY ANGIOGRAM N/A 12/27/2014   Procedure: LEFT HEART CATHETERIZATION WITH CORONARY ANGIOGRAM;  Surgeon: Larey Dresser, MD;  Location: [facility] CATH LAB;  Service: Cardiovascular;  Laterality: N/A;  . PERCUTANEOUS CORONARY ROTOBLATOR INTERVENTION (PCI-R)  01/07/2015  . PERCUTANEOUS CORONARY ROTOBLATOR INTERVENTION (PCI-R) N/A 01/07/2015   Procedure: PERCUTANEOUS CORONARY ROTOBLATOR INTERVENTION (PCI-R);  Surgeon: Blane Ohara, MD;  Location: [facility] CATH LAB;  Service: Cardiovascular;  Laterality: N/A;  . POSTERIOR FUSION THORACIC SPINE  08/2015  . POSTERIOR LUMBAR FUSION  01/2015  . TOTAL KNEE ARTHROPLASTY Bilateral 1990's - 2000's    There were no vitals filed for this visit.       [facility] PT Assessment - 05/30/20 0001      Assessment   Medical Diagnosis Spinal stenosis of lumbar region without neurogenic claudication    Referring Provider (PT) Levy Pupa, PA    Next MD Visit not made    Prior Therapy no      Precautions   Precautions Fall    Precaution Comments VERY FEARFUL OF FALLING; AVOID SUPINE IF POSSIBLE.                         [facility] Adult PT Treatment/Exercise - 05/30/20 0001      Lumbar Exercises: Aerobic   Nustep L2 x15 min      Modalities   Modalities Electrical  Stimulation;Moist Heat      Moist Heat Therapy   Number Minutes Moist Heat 10 Minutes    Moist Heat Location Lumbar Spine      Electrical Stimulation   Electrical Stimulation Location bilateral lumbar    Electrical Stimulation Action IFC    Electrical Stimulation Parameters 80-150 hz x10    Electrical Stimulation Goals Pain          STW/M to lumbar spine to decrease pain and tone.             PT Long Term Goals - 05/21/20 1633      PT LONG TERM GOAL #1   Title Patient will be independent with HEP  Status On-going      PT LONG TERM GOAL #2   Title Patient will report ability to perform ADLs with low back pain less than or equal to 4/10.    Time 6    Period Weeks    Status On-going      PT LONG TERM GOAL #3   Title Patient will report ability to stand at kitchen counter with UE support for 10 mins for safe meal preparation.    Time 6    Period Weeks    Status On-going                 Plan - 05/30/20 1211    Clinical Impression Statement Patient able to complete treatment but expressed how therapy is not helping her. Patient educated at length the importance of exercises as well as HEP for LE and core strengthening for the return of functional activities and standing tolerance. Patient's daughter was also educated on plan of care and justifcation for TEs. Conservative treatment today performed to which she responded well. Patient and PT discussed attempting standing TEs next visit despite always being a 10/10 pain with standing. Patient, patient's daughter and PT discussed 2 more visits next week then DC pending response to therapy and goal assessment.    Personal Factors and Comorbidities Comorbidity 3+;Age    Comorbidities HTN, AFib, CAD, CHF, thoracic spinal fusion 2016, lumbar spinal fusion 2016, history of stroke    Examination-Activity Limitations Bed Mobility;Locomotion Level;Transfers;Stand    Examination-Participation Restrictions Meal Prep     Stability/Clinical Decision Making Stable/Uncomplicated    Clinical Decision Making Low    Rehab Potential Poor    PT Frequency 2x / week    PT Duration 6 weeks    PT Treatment/Interventions ADLs/Self Care Home Management;Cryotherapy;Electrical Stimulation;Moist Heat;Ultrasound;Gait training;Stair training;Patient/family education;Functional mobility training;Therapeutic activities;Therapeutic exercise;Balance training;Neuromuscular re-education;Manual techniques;Passive range of motion    PT Next Visit Plan Attempt standing exercises? avoid supine if possible as patient is very fearful of falling. Nustep, seated exercises, STW/M to lumbar musculature, modalities PRN for pain relief    PT Home Exercise Plan see patient education section    Consulted and Agree with Plan of Care Patient           Patient will benefit from skilled therapeutic intervention in order to improve the following deficits and impairments:  Decreased activity tolerance, Difficulty walking, Pain, Postural dysfunction, Improper body mechanics, Decreased strength, Decreased mobility, Decreased balance  Visit Diagnosis: Chronic low back pain, unspecified back pain laterality, unspecified whether sciatica present  Difficulty in walking, not elsewhere classified  Muscle weakness (generalized)     Problem List Patient Active Problem List   Diagnosis Date Noted  . Dysphagia 03/25/2020  . HOH (hard of hearing) 03/25/2020  . Lichen sclerosus et atrophicus of the vulva 01/03/2019  . Chronic pain syndrome 02/14/2018  . Long-term current use of opiate analgesic 02/14/2018  . Anxiety 07/21/2016  . Chronic diastolic CHF (congestive heart failure) ([place]) 07/17/2015  . DDD (degenerative disc disease), lumbar 03/22/2015  . Fall 03/22/2015  . 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 Difficulty hearing 03/22/2015  . Lumbar scoliosis 03/22/2015  . History of pelvic fracture 03/22/2015  . History of other specified conditions  presenting hazards to health 03/22/2015  . MGUS (monoclonal gammopathy of unknown significance) 01/08/2015  . Permanent atrial fibrillation ([place]) 12/22/2014  . CAD (coronary artery disease) 12/22/2014  . Memory disorder 12/04/2014  . Angulation of spine 09/18/2014  . Metabolic syndrome 62/56/3893  . S/P  spinal fusion 02/23/2014  . Chronic pain associated with significant psychosocial dysfunction 02/23/2014  . Gastroesophageal hernia 07/06/2013  . Incontinence 10/13/2012  . 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 DEGENERATIVE JOINT DISEASE 04/08/2010  . Obesity (BMI 30.0-34.9) 04/04/2009  . CIRCADIAN RHYTHM SLEEP D/O DELAY SLEEP PHSE TYPE 04/04/2009  . CARPAL TUNNEL SYNDROME 04/04/2009  . IRRITABLE BOWEL SYNDROME 11/13/2008  . ESOPHAGEAL STRICTURE 11/12/2008  . GERD 11/12/2008  . Paralysis of diaphragm 11/12/2008  . DIVERTICULOSIS, COLON 11/12/2008  . Obstructive sleep apnea 05/23/2008  . Hyperlipidemia 05/22/2008  . Depression 05/22/2008  . Essential hypertension 05/22/2008  . INSOMNIA 05/22/2008    Gabriela Eves, PT, DPT 05/30/2020, 12:22 PM  [facility] [address] [place], [place], [address] Phone: [phone]   Fax:  [phone]  Name: [name] [name] MRN: [id number] Date of Birth: 1941-04-11

## 2020-06-04 ENCOUNTER — Ambulatory Visit: Payer: Medicare HMO | Admitting: Physical Therapy

## 2020-06-06 ENCOUNTER — Encounter: Payer: Medicare HMO | Admitting: Physical Therapy

## 2020-06-17 ENCOUNTER — Other Ambulatory Visit: Payer: Self-pay

## 2020-06-17 ENCOUNTER — Encounter: Payer: Self-pay | Admitting: Podiatry

## 2020-06-17 ENCOUNTER — Ambulatory Visit: Payer: Medicare HMO | Admitting: Podiatry

## 2020-06-17 DIAGNOSIS — B351 Tinea unguium: Secondary | ICD-10-CM | POA: Diagnosis not present

## 2020-06-17 DIAGNOSIS — M79674 Pain in right toe(s): Secondary | ICD-10-CM | POA: Diagnosis not present

## 2020-06-17 DIAGNOSIS — Z9229 Personal history of other drug therapy: Secondary | ICD-10-CM

## 2020-06-17 DIAGNOSIS — M79675 Pain in left toe(s): Secondary | ICD-10-CM

## 2020-06-17 DIAGNOSIS — Q828 Other specified congenital malformations of skin: Secondary | ICD-10-CM | POA: Diagnosis not present

## 2020-06-17 DIAGNOSIS — G609 Hereditary and idiopathic neuropathy, unspecified: Secondary | ICD-10-CM | POA: Diagnosis not present

## 2020-06-17 MED ORDER — NONFORMULARY OR COMPOUNDED ITEM
3 refills | Status: DC
Start: 2020-06-17 — End: 2020-07-31

## 2020-06-17 NOTE — Patient Instructions (Signed)
DO NOT WALK BAREFOOT IN THE HOUSE  [facility] [phone] Antifungal nail solution

## 2020-06-22 NOTE — Progress Notes (Addendum)
Subjective: [name] [name] is a pleasant 79 y.o. female patient seen today painful porokeratotic lesion(s) right foot and painful mycotic toenails that limit ambulation. Aggravating factors include weightbearing with and without shoe gear. Pain for both is relieved with periodic professional debridement.   Her daughter is present during today's visit. [name] [name] c/o painful plantar lesion right foot. She admits to walking on hardwood floors barefoot.   She has h/o chronic back pain with multiple surgeries and is on long-term pain management. Also currently undergoing PT for chronic low back pain.   They would like to discuss treatment options for onychomycosis on today's visit.  Past Medical History:  Diagnosis Date  . Abnormal nuclear cardiac imaging test   . Anemia   . Anemia, iron deficiency 07/02/2015  . Anxiety   . Arthritis    "knees, back, fingers, toes; joints" (01/07/2015)  . 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 CAD (coronary artery disease)    a. Abnl nuc 11/2014. Cath 12/2014 - turned down for CABG. Ultimately s/p TTVP, rotational atherectomy, PTCA and stenting of the ostial LCx and left main into the LAD (crush technique), and IVUS of the LAD/Left main. // b. Myoview 11/17: EF 48, poor quality/significant artifact; inf-lateral, inferior ischemia; Intermediate Risk  . Chronic atrial fibrillation ([place])    a.  First noted post-op 9/15 spinal fusion. She had cardioversion, not on anticoagulation. Fall risk, unsteady. // failed DCCV // Holter 10/17: AFib, Avg HR 97, PVCs, no other arrhythmia  . Chronic back pain greater than 3 months duration    a. spinal stenosis. Spinal fusion with rods in 2/15 at [facility] spinal fusion 9/15.  Marland Kitchen Chronic diastolic CHF    Echo 2/54:  EF 50-55%, trivial AI, midl MR, mod LAE, PASP 37 mmHg // Echocardiogram 03/2020: EF 45-50, no RWMA, mild LVH, mild reduced RVSF, mildly elevated PASP (RVSP 38.3), mild LAE, mild MR, mild to mod TR, trivial AI   .  Circadian rhythm sleep disorder   . CTS (carpal tunnel syndrome)   . Deficiency anemia 11/10/2014  . Depression   . Diarrhea   . Diverticulitis of colon   . Esophageal stricture   . Gastritis   . Gastroesophageal hernia 07/06/2013  . GERD (gastroesophageal reflux disease)   . Hiatal hernia   . History of blood transfusion    "most of them related to OR's"   . HTN (hypertension)   . Hyperlipidemia   . IBS (irritable bowel syndrome)   . Incontinence 10/13/2012  . Insomnia   . Ischemic chest pain ([place])   . Memory disorder 12/04/2014  . Metabolic syndrome 01/27/622  . MGUS (monoclonal gammopathy of unknown significance) dx'd 11/2014   a. Neg BMB 11/2014.  . Multiple falls   . Obesity   . Obstructive sleep apnea    "have mask; don't wear it" (01/07/2015)  . Orthostasis   . PAT (paroxysmal atrial tachycardia) ([place])   . Personal history of colonic polyps 10/25/2011 & 12/02/11   not retrieved Dr Lyla Son & tubular adenomas  . Pneumonia 03/2014  . Sinus bradycardia    a. Baseline HR 50s-60s.  . Stroke [facility]) early 2000's   "small"; denies residual on 01/07/2015)    Patient Active Problem List   Diagnosis Date Noted  . Dysphagia 03/25/2020  . HOH (hard of hearing) 03/25/2020  . Lichen sclerosus et atrophicus of the vulva 01/03/2019  . Chronic pain syndrome 02/14/2018  . Long-term current use of opiate analgesic 02/14/2018  . Anxiety 07/21/2016  .  Chronic diastolic CHF (congestive heart failure) ([place]) 07/17/2015  . DDD (degenerative disc disease), lumbar 03/22/2015  . Fall 03/22/2015  . 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 Difficulty hearing 03/22/2015  . Lumbar scoliosis 03/22/2015  . History of pelvic fracture 03/22/2015  . History of other specified conditions presenting hazards to health 03/22/2015  . MGUS (monoclonal gammopathy of unknown significance) 01/08/2015  . Permanent atrial fibrillation ([address]) 12/22/2014  . CAD (coronary artery disease) 12/22/2014  . Memory  disorder 12/04/2014  . Angulation of spine 09/18/2014  . Metabolic syndrome 09/32/3557  . S/P spinal fusion 02/23/2014  . Chronic pain associated with significant psychosocial dysfunction 02/23/2014  . Gastroesophageal hernia 07/06/2013  . Incontinence 10/13/2012  . 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 DEGENERATIVE JOINT DISEASE 04/08/2010  . Obesity (BMI 30.0-34.9) 04/04/2009  . CIRCADIAN RHYTHM SLEEP D/O DELAY SLEEP PHSE TYPE 04/04/2009  . CARPAL TUNNEL SYNDROME 04/04/2009  . IRRITABLE BOWEL SYNDROME 11/13/2008  . ESOPHAGEAL STRICTURE 11/12/2008  . GERD 11/12/2008  . Paralysis of diaphragm 11/12/2008  . DIVERTICULOSIS, COLON 11/12/2008  . Obstructive sleep apnea 05/23/2008  . Hyperlipidemia 05/22/2008  . Depression 05/22/2008  . Essential hypertension 05/22/2008  . INSOMNIA 05/22/2008    Current Outpatient Medications on File Prior to Visit  Medication Sig Dispense Refill  . aspirin EC 81 MG tablet Take 1 tablet (81 mg total) by mouth daily. 90 tablet 3  . Calcium Citrate-Vitamin D (CALCIUM + D PO) Take 1 tablet by mouth daily.    . Cholecalciferol (VITAMIN D) 2000 units tablet Take 2,000 Units by mouth daily.    . clobetasol cream (TEMOVATE) 3.22 % Apply 1 application topically See admin instructions. Applies to vaginal area twice a week.    . diltiazem (CARDIZEM CD) 120 MG 24 hr capsule Take 1 capsule (120 mg total) by mouth 2 (two) times daily. 180 capsule 3  . diphenoxylate-atropine (LOMOTIL) 2.5-0.025 MG per tablet Take 1 tablet by mouth 4 (four) times daily as needed for diarrhea or loose stools.     Marland Kitchen ELIQUIS 5 MG TABS tablet Take 1 tablet (5 mg total) by mouth 2 (two) times daily. 180 tablet 2  . Eszopiclone 3 MG TABS Take 1 tablet (3 mg total) by mouth at bedtime. For insomnia,take immediately before bedtime. 90 tablet 1  . fentaNYL (DURAGESIC - DOSED MCG/HR) 50 MCG/HR Place 1 patch (50 mcg total) onto the skin every 3 (three) days. 10 patch 0  . Ferrous Sulfate  (IRON PO) Take by mouth. Take one tablet by mouth daily (OTC Supplement)    . FLUoxetine (PROZAC) 40 MG capsule Take 1 capsule (40 mg total) by mouth daily. 90 capsule 3  . furosemide (LASIX) 80 MG tablet Take 1 tablet (80 mg total) by mouth daily. 90 tablet 3  . HYDROcodone-acetaminophen (NORCO) 10-325 MG per tablet Take 1 tablet by mouth every 4 (four) hours as needed for moderate pain.     . methocarbamol (ROBAXIN) 500 MG tablet Take 500 mg by mouth 4 (four) times daily as needed for muscle spasms.    . Multiple Vitamin (MULTIVITAMIN) tablet Take 1 tablet by mouth daily. For supplement    . nitroGLYCERIN (NITROSTAT) 0.4 MG SL tablet Place 1 tablet (0.4 mg total) under the tongue every 5 (five) minutes as needed for chest pain (up to 3 doses). 25 tablet 3  . pantoprazole (PROTONIX) 40 MG tablet Take 1 tablet (40 mg total) by mouth 2 (two) times daily. 180 tablet 3  . potassium chloride SA (K-DUR) 20  MEQ tablet Take 1 tablet (20 mEq total) by mouth daily. 90 tablet 3  . rosuvastatin (CRESTOR) 10 MG tablet Take 1 tablet (10 mg total) by mouth daily. 90 tablet 0  . triamcinolone cream (KENALOG) 0.1 % SMARTSIG:1 Application Topical 2-3 Times Daily     No current facility-administered medications on file prior to visit.    Allergies  Allergen Reactions  . Buprenorphine Hcl Itching  . Cymbalta [Duloxetine Hcl] Other (See Comments)    Other reaction(s): Other (See Comments) Personality changes - crying  2014  . Morphine And Related Itching  . Oxycodone-Acetaminophen Other (See Comments)    Personality changes   . Valium [Diazepam] Other (See Comments)    Personality changes - "in another world" ;  Hallucinations  2015  . Statins Other (See Comments)    Other reaction(s): Other (See Comments) Muscle weakness Muscle weakness  . Altace [Ramipril] Other (See Comments)    unknown  . Floxin [Ofloxacin] Other (See Comments)    unknown  . Hydromorphone Other (See Comments)    Other reaction(s):  Confusion (intolerance) unknown  . Lovaza [Omega-3-Acid Ethyl Esters] Other (See Comments)    Muscle weakness   . Trilipix [Choline Fenofibrate] Other (See Comments)    Muscle weakness   . Zetia [Ezetimibe] Other (See Comments)    Muscle weakness     Objective: Physical Exam General: [name] [name] is a pleasant 79 y.o. Caucasian female, WD, WN in NAD. AAO x 3.   Vascular:  Neurovascular status unchanged b/l lower extremities. Capillary refill time to digits immediate b/l. Palpable DP pulse(s) b/l lower extremities Faintly palpable PT pulse(s) b/l lower extremities. Pedal hair present. Lower extremity skin temperature gradient within normal limits. No pain with calf compression b/l.  Dermatological:  Pedal skin with normal turgor, texture and tone bilaterally. No open wounds bilaterally. No interdigital macerations bilaterally. Toenails 2-5 bilaterally elongated, discolored, dystrophic, thickened, and crumbly with subungual debris and tenderness to dorsal palpation. Anonychia noted L hallux and R hallux. Nailbed(s) epithelialized.   Musculoskeletal:  Normal muscle strength 5/5 to all lower extremity muscle groups bilaterally. No pain crepitus or joint limitation noted with ROM b/l. Hammertoes noted to the R 2nd toe.  Neurological:  Protective sensation intact 5/5 intact bilaterally with 10g monofilament b/l. Vibratory sensation diminished b/l.  Assessment and Plan:  1. Pain due to onychomycosis of toenails of both feet   2. Porokeratosis   3. Idiopathic peripheral neuropathy   4. Hx of long term use of blood thinners    -Examined patient. -During today's visit, discussed topical, laser and oral medication options for onychomycosis. Patient/daughter opted for topical treatment with compounded medication. Rx written for nonformulary compounding topical antifungal: [facility]: Antifungal cream - Terbinafine 3%, Fluconazole 2%, Tea Tree Oil 5%, Urea 10%, Ibuprofen 2% in DMSO  Suspension #56ml. Apply to the affected nail(s) at bedtime. -Toenails 2-5 bilaterally debrided in length and girth without iatrogenic bleeding with sterile nail nipper and dremel.  -Painful porokeratotic lesion(s) submet head 5 right foot pared and enucleated with sterile scalpel blade without incident. Recommended she purchase Dr. Renea Ee horseshoe gel pad to offload painful porokeratotic lesion of right foot. She should apply every morning and remove every evening. -Patient to report any pedal injuries to medical professional immediately. -Patient to continue soft, supportive shoe gear daily. -Patient/POA to call should there be question/concern in the interim.  Return in about 9 weeks (around 08/19/2020) for nail and callus trim/ Eliquis.  Marzetta Board, DPM

## 2020-07-09 DIAGNOSIS — M1811 Unilateral primary osteoarthritis of first carpometacarpal joint, right hand: Secondary | ICD-10-CM | POA: Insufficient documentation

## 2020-07-09 DIAGNOSIS — M65331 Trigger finger, right middle finger: Secondary | ICD-10-CM | POA: Insufficient documentation

## 2020-07-09 DIAGNOSIS — M1812 Unilateral primary osteoarthritis of first carpometacarpal joint, left hand: Secondary | ICD-10-CM | POA: Insufficient documentation

## 2020-07-15 ENCOUNTER — Emergency Department (HOSPITAL_COMMUNITY): Payer: Medicare HMO

## 2020-07-15 ENCOUNTER — Encounter (HOSPITAL_COMMUNITY): Payer: Self-pay | Admitting: Emergency Medicine

## 2020-07-15 ENCOUNTER — Emergency Department (HOSPITAL_COMMUNITY)
Admission: EM | Admit: 2020-07-15 | Discharge: 2020-07-15 | Disposition: A | Discharge status: Home / Self Care | Payer: Medicare HMO | Source: Home / Self Care | Attending: Emergency Medicine | Admitting: Emergency Medicine

## 2020-07-15 ENCOUNTER — Other Ambulatory Visit: Payer: Self-pay

## 2020-07-15 DIAGNOSIS — R1310 Dysphagia, unspecified: Secondary | ICD-10-CM

## 2020-07-15 DIAGNOSIS — Z7982 Long term (current) use of aspirin: Secondary | ICD-10-CM | POA: Insufficient documentation

## 2020-07-15 DIAGNOSIS — K224 Dyskinesia of esophagus: Secondary | ICD-10-CM | POA: Diagnosis not present

## 2020-07-15 DIAGNOSIS — J029 Acute pharyngitis, unspecified: Secondary | ICD-10-CM

## 2020-07-15 DIAGNOSIS — Z955 Presence of coronary angioplasty implant and graft: Secondary | ICD-10-CM | POA: Insufficient documentation

## 2020-07-15 DIAGNOSIS — R0603 Acute respiratory distress: Secondary | ICD-10-CM | POA: Diagnosis not present

## 2020-07-15 DIAGNOSIS — I5032 Chronic diastolic (congestive) heart failure: Secondary | ICD-10-CM | POA: Insufficient documentation

## 2020-07-15 DIAGNOSIS — Z7901 Long term (current) use of anticoagulants: Secondary | ICD-10-CM | POA: Insufficient documentation

## 2020-07-15 DIAGNOSIS — I110 Hypertensive heart disease with heart failure: Secondary | ICD-10-CM | POA: Insufficient documentation

## 2020-07-15 DIAGNOSIS — I2510 Atherosclerotic heart disease of native coronary artery without angina pectoris: Secondary | ICD-10-CM | POA: Insufficient documentation

## 2020-07-15 NOTE — ED Notes (Signed)
Pt returned from radiology.

## 2020-07-15 NOTE — ED Notes (Signed)
Pt transported to radiology.

## 2020-07-15 NOTE — ED Triage Notes (Addendum)
Pt brought in by EMS after she choked on a potassium pill around 11am this morning. Pt now c/o a sore throat and describes it as "being on fire" since choking episode as well as some burning in her epigastric area. Pt states she has not tried eating or drinking anything since the choking episode d/t her sore throat.

## 2020-07-15 NOTE — ED Notes (Signed)
ED Provider at bedside. 

## 2020-07-15 NOTE — ED Notes (Signed)
This RN and Vivien Rota, RN was called to waiting room because pt was in floor. When RN's arrived in waiting room pt was lying in floor stating she could not get up. This RN and security went back to look at cameras and at 0223, pt was seen to place herself in floor. RN's and security encouraged pt to get herself up, pt stated she could not. Pt assisted to wheelchair and then to the restroom per request. Pt then refused to get back in wheelchair and to come out of restroom. Pt was in wheelchair when this RN went back into restroom to check on pt. Pt then stating she wanted to go home, but daughter wants her evaluated.

## 2020-07-15 NOTE — ED Provider Notes (Signed)
[facility] Provider Note   CSN: [id number] Arrival date & time: 07/15/20  0151     History Chief Complaint  Patient presents with  . Sore Throat    [name] [name] is a 79 y.o. female.  The history is provided by the patient, a relative and a caregiver.  Sore Throat This is a new problem. The current episode started yesterday. The problem occurs constantly. The problem has been gradually worsening. Pertinent negatives include no shortness of breath. The symptoms are aggravated by swallowing. Nothing relieves the symptoms.      Patient with extensive medical conditions including chronic pain, CAD, chronic atrial fibrillation, esophageal stricture presents with sore throat. Patient reported she choked on a potassium pill earlier in the morning.  Since that time she has been having a sore throat and reporting her throat is on fire. Past Medical History:  Diagnosis Date  . Abnormal nuclear cardiac imaging test   . Anemia   . Anemia, iron deficiency 07/02/2015  . Anxiety   . Arthritis    "knees, back, fingers, toes; joints" (01/07/2015)  . 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 CAD (coronary artery disease)    a. Abnl nuc 11/2014. Cath 12/2014 - turned down for CABG. Ultimately s/p TTVP, rotational atherectomy, PTCA and stenting of the ostial LCx and left main into the LAD (crush technique), and IVUS of the LAD/Left main. // b. Myoview 11/17: EF 48, poor quality/significant artifact; inf-lateral, inferior ischemia; Intermediate Risk  . Chronic atrial fibrillation ([place])    a.  First noted post-op 9/15 spinal fusion. She had cardioversion, not on anticoagulation. Fall risk, unsteady. // failed DCCV // Holter 10/17: AFib, Avg HR 97, PVCs, no other arrhythmia  . Chronic back pain greater than 3 months duration    a. spinal stenosis. Spinal fusion with rods in 2/15 at [facility] spinal fusion 9/15.  Marland Kitchen Chronic diastolic CHF    Echo 3/35:  EF 50-55%, trivial AI, midl  MR, mod LAE, PASP 37 mmHg // Echocardiogram 03/2020: EF 45-50, no RWMA, mild LVH, mild reduced RVSF, mildly elevated PASP (RVSP 38.3), mild LAE, mild MR, mild to mod TR, trivial AI   . Circadian rhythm sleep disorder   . CTS (carpal tunnel syndrome)   . Deficiency anemia 11/10/2014  . Depression   . Diarrhea   . Diverticulitis of colon   . Esophageal stricture   . Gastritis   . Gastroesophageal hernia 07/06/2013  . GERD (gastroesophageal reflux disease)   . Hiatal hernia   . History of blood transfusion    "most of them related to OR's"   . HTN (hypertension)   . Hyperlipidemia   . IBS (irritable bowel syndrome)   . Incontinence 10/13/2012  . Insomnia   . Ischemic chest pain ([place])   . Memory disorder 12/04/2014  . Metabolic syndrome 03/26/6255  . MGUS (monoclonal gammopathy of unknown significance) dx'd 11/2014   a. Neg BMB 11/2014.  . Multiple falls   . Obesity   . Obstructive sleep apnea    "have mask; don't wear it" (01/07/2015)  . Orthostasis   . PAT (paroxysmal atrial tachycardia) ([place])   . Personal history of colonic polyps 10/25/2011 & 12/02/11   not retrieved Dr Lyla Son & tubular adenomas  . Pneumonia 03/2014  . Sinus bradycardia    a. Baseline HR 50s-60s.  . Stroke [facility]) early 2000's   "small"; denies residual on 01/07/2015)    Patient Active Problem List   Diagnosis Date Noted  .  Dysphagia 03/25/2020  . HOH (hard of hearing) 03/25/2020  . Lichen sclerosus et atrophicus of the vulva 01/03/2019  . Chronic pain syndrome 02/14/2018  . Long-term current use of opiate analgesic 02/14/2018  . Anxiety 07/21/2016  . Chronic diastolic CHF (congestive heart failure) ([place]) 07/17/2015  . DDD (degenerative disc disease), lumbar 03/22/2015  . Fall 03/22/2015  . 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 Difficulty hearing 03/22/2015  . Lumbar scoliosis 03/22/2015  . History of pelvic fracture 03/22/2015  . History of other specified conditions presenting hazards to health  03/22/2015  . MGUS (monoclonal gammopathy of unknown significance) 01/08/2015  . Permanent atrial fibrillation ([place]) 12/22/2014  . CAD (coronary artery disease) 12/22/2014  . Memory disorder 12/04/2014  . Angulation of spine 09/18/2014  . Metabolic syndrome 46/56/8127  . S/P spinal fusion 02/23/2014  . Chronic pain associated with significant psychosocial dysfunction 02/23/2014  . Gastroesophageal hernia 07/06/2013  . Incontinence 10/13/2012  . 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 DEGENERATIVE JOINT DISEASE 04/08/2010  . Obesity (BMI 30.0-34.9) 04/04/2009  . CIRCADIAN RHYTHM SLEEP D/O DELAY SLEEP PHSE TYPE 04/04/2009  . CARPAL TUNNEL SYNDROME 04/04/2009  . IRRITABLE BOWEL SYNDROME 11/13/2008  . ESOPHAGEAL STRICTURE 11/12/2008  . GERD 11/12/2008  . Paralysis of diaphragm 11/12/2008  . DIVERTICULOSIS, COLON 11/12/2008  . Obstructive sleep apnea 05/23/2008  . Hyperlipidemia 05/22/2008  . Depression 05/22/2008  . Essential hypertension 05/22/2008  . INSOMNIA 05/22/2008    Past Surgical History:  Procedure Laterality Date  . BACK SURGERY    . BONE MARROW BIOPSY  11/2014  . CARDIAC CATHETERIZATION  12/27/2014   Procedure: INTRAVASCULAR PRESSURE WIRE/FFR STUDY;  Surgeon: Larey Dresser, MD;  Location[facility] CATH LAB;  Service: Cardiovascular;;  . CARDIAC CATHETERIZATION N/A 11/25/2016   Procedure: Left Heart Cath and Coronary Angiography;  Surgeon: Sherren Mocha, MD;  Location: [facility] CV LAB;  Service: Cardiovascular;  Laterality: N/A;  . CARDIOVERSION N/A 02/13/2016   Procedure: CARDIOVERSION;  Surgeon: Josue Hector, MD;  Location: [facility] ENDOSCOPY;  Service: Cardiovascular;  Laterality: N/A;  . CARPAL TUNNEL RELEASE Right 1980's  . CATARACT EXTRACTION Bilateral   . CATARACT EXTRACTION W/ INTRAOCULAR LENS  IMPLANT, BILATERAL  2000's  . CORONARY ANGIOPLASTY WITH STENT PLACEMENT  01/07/2015   "2"  . CORONARY STENT PLACEMENT    . DILATION AND CURETTAGE OF UTERUS    .  ESOPHAGOGASTRODUODENOSCOPY (EGD) WITH ESOPHAGEAL DILATION  "several times"  . JOINT REPLACEMENT    . KNEE ARTHROSCOPY Left 1995  . LAPAROSCOPIC CHOLECYSTECTOMY  2003  . LEFT HEART CATHETERIZATION WITH CORONARY ANGIOGRAM N/A 12/27/2014   Procedure: LEFT HEART CATHETERIZATION WITH CORONARY ANGIOGRAM;  Surgeon: Larey Dresser, MD;  Location: [facility] CATH LAB;  Service: Cardiovascular;  Laterality: N/A;  . PERCUTANEOUS CORONARY ROTOBLATOR INTERVENTION (PCI-R)  01/07/2015  . PERCUTANEOUS CORONARY ROTOBLATOR INTERVENTION (PCI-R) N/A 01/07/2015   Procedure: PERCUTANEOUS CORONARY ROTOBLATOR INTERVENTION (PCI-R);  Surgeon: Blane Ohara, MD;  Location: [facility] CATH LAB;  Service: Cardiovascular;  Laterality: N/A;  . POSTERIOR FUSION THORACIC SPINE  08/2015  . POSTERIOR LUMBAR FUSION  01/2015  . TOTAL KNEE ARTHROPLASTY Bilateral 1990's - 2000's     OB History   No obstetric history on file.     Family History  Problem Relation Age of Onset  . Coronary artery disease Father   . Peripheral vascular disease Father   . Coronary artery disease Mother   . Coronary artery disease Brother   . Colon cancer Sister   . Emphysema Sister   .  Sleep apnea Son   . Colon cancer Sister        spread to her brain  . Emphysema Brother   . Dementia Neg Hx     Social History   Tobacco Use  . Smoking status: Never Smoker  . Smokeless tobacco: Never Used  Vaping Use  . Vaping Use: Never used  Substance Use Topics  . Alcohol use: No    Comment: 01/07/2015 "glass of wine at Christmas, maybe"  . Drug use: No    Home Medications Prior to Admission medications   Medication Sig Start Date End Date Taking? Authorizing Provider  aspirin EC 81 MG tablet Take 1 tablet (81 mg total) by mouth daily. 11/23/16   Isaiah Serge, NP  Calcium Citrate-Vitamin D (CALCIUM + D PO) Take 1 tablet by mouth daily.    [provider]  Cholecalciferol (VITAMIN D) 2000 units tablet Take 2,000 Units by mouth daily.    [provider]  clobetasol cream (TEMOVATE) 8.93 % Apply 1 application topically See admin instructions. Applies to vaginal area twice a week. 09/23/16   [provider]  diltiazem (CARDIZEM CD) 120 MG 24 hr capsule Take 1 capsule (120 mg total) by mouth 2 (two) times daily. 07/20/19   Dettinger, Fransisca Kaufmann, MD  diphenoxylate-atropine (LOMOTIL) 2.5-0.025 MG per tablet Take 1 tablet by mouth 4 (four) times daily as needed for diarrhea or loose stools.  08/17/14   [provider]  ELIQUIS 5 MG TABS tablet Take 1 tablet (5 mg total) by mouth 2 (two) times daily. 04/26/20   [name], DO  Eszopiclone 3 MG TABS Take 1 tablet (3 mg total) by mouth at bedtime. For insomnia,take immediately before bedtime. 07/20/19   Dettinger, Fransisca Kaufmann, MD  fentaNYL (DURAGESIC - DOSED MCG/HR) 50 MCG/HR Place 1 patch (50 mcg total) onto the skin every 3 (three) days. 10/31/14   [name], MD  Ferrous Sulfate (IRON PO) Take by mouth. Take one tablet by mouth daily (OTC Supplement)    [provider]  FLUoxetine (PROZAC) 40 MG capsule Take 1 capsule (40 mg total) by mouth daily. 07/20/19   Dettinger, Fransisca Kaufmann, MD  furosemide (LASIX) 80 MG tablet Take 1 tablet (80 mg total) by mouth daily. 07/20/19 07/14/20  Dettinger, Fransisca Kaufmann, MD  HYDROcodone-acetaminophen (NORCO) 10-325 MG per tablet Take 1 tablet by mouth every 4 (four) hours as needed for moderate pain.     [provider]  methocarbamol (ROBAXIN) 500 MG tablet Take 500 mg by mouth 4 (four) times daily as needed for muscle spasms.    [provider]  Multiple Vitamin (MULTIVITAMIN) tablet Take 1 tablet by mouth daily. For supplement    [provider]  nitroGLYCERIN (NITROSTAT) 0.4 MG SL tablet Place 1 tablet (0.4 mg total) under the tongue every 5 (five) minutes as needed for chest pain (up to 3 doses). 10/02/19   Richardson Dopp T, PA-C  NONFORMULARY OR COMPOUNDED ITEM Antifungal solution: Terbinafine 3%, Fluconazole  2%, Tea Tree Oil 5%, Urea 10%, Ibuprofen 2% in DMSO suspension #26m 06/17/20   [place], JStephani Police DPM  pantoprazole (PROTONIX) 40 MG tablet Take 1 tablet (40 mg total) by mouth 2 (two) times daily. 07/20/19   Dettinger, JFransisca Kaufmann MD  potassium chloride SA (K-DUR) 20 MEQ tablet Take 1 tablet (20 mEq total) by mouth daily. 07/20/19   Dettinger, JFransisca Kaufmann MD  rosuvastatin (CRESTOR) 10 MG tablet Take 1 tablet (10 mg total) by  mouth daily. 10/25/19   Dettinger, Fransisca Kaufmann, MD  triamcinolone cream (KENALOG) 0.1 % SMARTSIG:1 Application Topical 2-3 Times Daily 06/10/20   [provider]    Allergies    Buprenorphine hcl, Cymbalta [duloxetine hcl], Morphine and related, Oxycodone-acetaminophen, Valium [diazepam], Statins, Altace [ramipril], Floxin [ofloxacin], Hydromorphone, Lovaza [omega-3-acid ethyl esters], Trilipix [choline fenofibrate], and Zetia [ezetimibe]  Review of Systems   Review of Systems  Constitutional: Negative for fever.  HENT: Positive for sore throat and trouble swallowing.   Respiratory: Negative for shortness of breath.     Physical Exam Updated Vital Signs BP (!) 132/70 (BP Location: Left Arm)   Pulse (!) 115   Temp 98.5 F (36.9 C) (Oral)   Resp 16   Ht 1.702 m (5' 7" )   Wt 83.5 kg   SpO2 94%   BMI 28.82 kg/m   Physical Exam CONSTITUTIONAL: Elderly, anxious HEAD: Normocephalic/atraumatic EYES: EOMI/PERRL ENMT: Mucous membranes moist, uvula midline without erythema or exudates/no edema.  No stridor.  Patient is handling her secretions.  No hot potato voice.  Patient does sound congested. NECK: supple no meningeal signs, no anterior neck edema or tenderness CV: No loud murmurs LUNGS: Lungs are clear to auscultation bilaterally, no apparent distress ABDOMEN: soft NEURO: Pt is awake/alert/appropriate, moves all extremitiesx4.  No facial droop.   EXTREMITIES:  full ROM SKIN: warm, color normal PSYCH: Anxious  ED Results / Procedures / Treatments    Labs (all labs ordered are listed, but only abnormal results are displayed) Labs Reviewed - No data to display  EKG None  Radiology DG Neck Soft Tissue  Result Date: 07/15/2020 CLINICAL DATA:  Choked on a pill.  Sore throat EXAM: NECK SOFT TISSUES - 1+ VIEW COMPARISON:  None. FINDINGS: Densities over the throat are attributed to cartilage calcification. No ovoid density typical of a retained pill. No detected airway swelling or narrowing. Cervical spine degeneration in keeping with age. Upper thoracic spine fusion. IMPRESSION: No visible foreign body or upper airway swelling. Electronically Signed   By: Monte Fantasia M.D.   On: 07/15/2020 04:49   DG Chest 2 View  Result Date: 07/15/2020 CLINICAL DATA:  Choked on a potassium pill with epigastric pain and throat burning EXAM: CHEST - 2 VIEW COMPARISON:  04/03/2020 FINDINGS: Extensive thoracic and lumbar spine fusion. No cardiomegaly when accounting for a large hiatal hernia. Chronic interstitial reticulation. There is no edema, consolidation, effusion, or pneumothorax. Eventration of the right diaphragm. IMPRESSION: Stable compared to prior.  No evidence of acute disease. Electronically Signed   By: Monte Fantasia M.D.   On: 07/15/2020 04:50    Procedures Procedures   Medications Ordered in ED Medications - No data to display  ED Course  I have reviewed the triage vital signs and the nursing notes.  Pertinent labs  imaging results that were available during my care of the patient were reviewed by me and considered in my medical decision making (see chart for details).    MDM Rules/Calculators/A&P                          Patient presents with sore throat after swallowing a potassium pill.  No signs of any acute upper airway compromise.  Neck x-ray and chest x-ray showed no acute findings Patient was able to take p.o. fluids here.  While waiting for assessment, patient kept laying down in the floor and had to be redirected back to  the bed. Patient has multiple  chronic health issues and pain issues.  However at this time she is in no acute distress and handling her secretions and taking p.o.  No signs of any airway obstruction or esophageal obstruction.  Daughter is at bedside who closely follows and manages her mother. Final Clinical Impression(s) / ED Diagnoses Final diagnoses:  Sore throat  Dysphagia, unspecified type    Rx / DC Orders ED Discharge Orders    None       Ripley Fraise, MD 07/15/20 [phone]

## 2020-07-16 ENCOUNTER — Emergency Department (HOSPITAL_COMMUNITY): Payer: Medicare HMO

## 2020-07-16 ENCOUNTER — Encounter (HOSPITAL_COMMUNITY): Payer: Self-pay | Admitting: Emergency Medicine

## 2020-07-16 ENCOUNTER — Inpatient Hospital Stay (HOSPITAL_COMMUNITY): Payer: Medicare HMO

## 2020-07-16 ENCOUNTER — Inpatient Hospital Stay (HOSPITAL_COMMUNITY)
Admission: EM | Admit: 2020-07-16 | Discharge: 2020-07-20 | DRG: 392 | Disposition: A | Discharge status: Home Health | Payer: Medicare HMO | Attending: Internal Medicine | Admitting: Internal Medicine

## 2020-07-16 DIAGNOSIS — Z96653 Presence of artificial knee joint, bilateral: Secondary | ICD-10-CM | POA: Diagnosis present

## 2020-07-16 DIAGNOSIS — I4821 Permanent atrial fibrillation: Secondary | ICD-10-CM | POA: Diagnosis not present

## 2020-07-16 DIAGNOSIS — F329 Major depressive disorder, single episode, unspecified: Secondary | ICD-10-CM | POA: Diagnosis present

## 2020-07-16 DIAGNOSIS — R1314 Dysphagia, pharyngoesophageal phase: Secondary | ICD-10-CM | POA: Diagnosis present

## 2020-07-16 DIAGNOSIS — E785 Hyperlipidemia, unspecified: Secondary | ICD-10-CM | POA: Diagnosis present

## 2020-07-16 DIAGNOSIS — I110 Hypertensive heart disease with heart failure: Secondary | ICD-10-CM | POA: Diagnosis present

## 2020-07-16 DIAGNOSIS — Z825 Family history of asthma and other chronic lower respiratory diseases: Secondary | ICD-10-CM

## 2020-07-16 DIAGNOSIS — K219 Gastro-esophageal reflux disease without esophagitis: Secondary | ICD-10-CM | POA: Diagnosis present

## 2020-07-16 DIAGNOSIS — K449 Diaphragmatic hernia without obstruction or gangrene: Secondary | ICD-10-CM | POA: Diagnosis present

## 2020-07-16 DIAGNOSIS — Z8673 Personal history of transient ischemic attack (TIA), and cerebral infarction without residual deficits: Secondary | ICD-10-CM

## 2020-07-16 DIAGNOSIS — Z800 Family history of malignant neoplasm of digestive organs: Secondary | ICD-10-CM

## 2020-07-16 DIAGNOSIS — R1310 Dysphagia, unspecified: Secondary | ICD-10-CM

## 2020-07-16 DIAGNOSIS — Z955 Presence of coronary angioplasty implant and graft: Secondary | ICD-10-CM | POA: Diagnosis not present

## 2020-07-16 DIAGNOSIS — T17908A Unspecified foreign body in respiratory tract, part unspecified causing other injury, initial encounter: Secondary | ICD-10-CM | POA: Diagnosis present

## 2020-07-16 DIAGNOSIS — Z79899 Other long term (current) drug therapy: Secondary | ICD-10-CM

## 2020-07-16 DIAGNOSIS — Z7982 Long term (current) use of aspirin: Secondary | ICD-10-CM

## 2020-07-16 DIAGNOSIS — R0603 Acute respiratory distress: Secondary | ICD-10-CM

## 2020-07-16 DIAGNOSIS — N390 Urinary tract infection, site not specified: Secondary | ICD-10-CM | POA: Diagnosis present

## 2020-07-16 DIAGNOSIS — Z8249 Family history of ischemic heart disease and other diseases of the circulatory system: Secondary | ICD-10-CM

## 2020-07-16 DIAGNOSIS — K589 Irritable bowel syndrome without diarrhea: Secondary | ICD-10-CM | POA: Diagnosis present

## 2020-07-16 DIAGNOSIS — R0982 Postnasal drip: Secondary | ICD-10-CM | POA: Diagnosis present

## 2020-07-16 DIAGNOSIS — Z20822 Contact with and (suspected) exposure to covid-19: Secondary | ICD-10-CM | POA: Diagnosis present

## 2020-07-16 DIAGNOSIS — I5032 Chronic diastolic (congestive) heart failure: Secondary | ICD-10-CM | POA: Diagnosis present

## 2020-07-16 DIAGNOSIS — G8929 Other chronic pain: Secondary | ICD-10-CM | POA: Diagnosis present

## 2020-07-16 DIAGNOSIS — G4700 Insomnia, unspecified: Secondary | ICD-10-CM | POA: Diagnosis present

## 2020-07-16 DIAGNOSIS — I4891 Unspecified atrial fibrillation: Secondary | ICD-10-CM

## 2020-07-16 DIAGNOSIS — F419 Anxiety disorder, unspecified: Secondary | ICD-10-CM | POA: Diagnosis present

## 2020-07-16 DIAGNOSIS — K224 Dyskinesia of esophagus: Secondary | ICD-10-CM | POA: Diagnosis present

## 2020-07-16 DIAGNOSIS — I2510 Atherosclerotic heart disease of native coronary artery without angina pectoris: Secondary | ICD-10-CM | POA: Diagnosis present

## 2020-07-16 DIAGNOSIS — Z981 Arthrodesis status: Secondary | ICD-10-CM | POA: Diagnosis not present

## 2020-07-16 DIAGNOSIS — T17308A Unspecified foreign body in larynx causing other injury, initial encounter: Secondary | ICD-10-CM

## 2020-07-16 DIAGNOSIS — Z7901 Long term (current) use of anticoagulants: Secondary | ICD-10-CM

## 2020-07-16 DIAGNOSIS — Z79891 Long term (current) use of opiate analgesic: Secondary | ICD-10-CM

## 2020-07-16 DIAGNOSIS — R059 Cough, unspecified: Secondary | ICD-10-CM

## 2020-07-16 LAB — BASIC METABOLIC PANEL
Anion gap: 14 (ref 5–15)
BUN: 10 mg/dL (ref 8–23)
CO2: 26 mmol/L (ref 22–32)
Calcium: 9.6 mg/dL (ref 8.9–10.3)
Chloride: 99 mmol/L (ref 98–111)
Creatinine, Ser: 0.89 mg/dL (ref 0.44–1.00)
GFR calc Af Amer: >60 mL/min (ref >60)
GFR calc non Af Amer: >60 mL/min (ref >60)
Glucose, Bld: 132 mg/dL — ABNORMAL HIGH (ref 70–99)
Potassium: 3.7 mmol/L (ref 3.5–5.1)
Sodium: 139 mmol/L (ref 135–145)

## 2020-07-16 LAB — CBC
HCT: 42 % (ref 36.0–46.0)
Hemoglobin: 13.1 g/dL (ref 12.0–15.0)
MCH: 30.6 pg (ref 26.0–34.0)
MCHC: 31.2 g/dL (ref 30.0–36.0)
MCV: 98.1 fL (ref 80.0–100.0)
Platelets: 249 10*3/uL (ref 150–400)
RBC: 4.28 MIL/uL (ref 3.87–5.11)
RDW: 13.2 % (ref 11.5–15.5)
WBC: 10.6 10*3/uL — ABNORMAL HIGH (ref 4.0–10.5)
nRBC: 0 % (ref 0.0–0.2)

## 2020-07-16 LAB — SARS CORONAVIRUS 2 BY RT PCR (HOSPITAL ORDER, PERFORMED IN ~~LOC~~ HOSPITAL LAB): SARS Coronavirus 2: NEGATIVE

## 2020-07-16 LAB — GLUCOSE, CAPILLARY: Glucose-Capillary: 112 mg/dL — ABNORMAL HIGH (ref 70–99)

## 2020-07-16 LAB — MRSA PCR SCREENING: MRSA by PCR: NEGATIVE

## 2020-07-16 MED ORDER — PANTOPRAZOLE SODIUM 40 MG IV SOLR
40.0000 mg | Freq: Two times a day (BID) | INTRAVENOUS | Status: DC
  Administered 2020-07-16 – 2020-07-19 (×7): 40 mg via INTRAVENOUS
  Filled 2020-07-16 (×7): qty 40

## 2020-07-16 MED ORDER — DILTIAZEM HCL-DEXTROSE 125-5 MG/125ML-% IV SOLN (PREMIX)
5.0000 mg/h | INTRAVENOUS | Status: DC
  Administered 2020-07-16: 5 mg/h via INTRAVENOUS
  Administered 2020-07-17: 10 mg/h via INTRAVENOUS
  Administered 2020-07-17: 15 mg/h via INTRAVENOUS
  Administered 2020-07-18: 5 mg/h via INTRAVENOUS
  Filled 2020-07-16 (×5): qty 125

## 2020-07-16 MED ORDER — LACTATED RINGERS IV SOLN: INTRAVENOUS | Status: DC

## 2020-07-16 MED ORDER — METOCLOPRAMIDE HCL 5 MG/ML IJ SOLN
5.0000 mg | Freq: Once | INTRAMUSCULAR | Status: AC
  Administered 2020-07-16: 5 mg via INTRAVENOUS
  Filled 2020-07-16: qty 2

## 2020-07-16 MED ORDER — LORAZEPAM 2 MG/ML IJ SOLN
1.0000 mg | Freq: Once | INTRAMUSCULAR | Status: AC | PRN
  Administered 2020-07-16: 1 mg via INTRAVENOUS
  Filled 2020-07-16: qty 1

## 2020-07-16 MED ORDER — FENTANYL 50 MCG/HR TD PT72: 1.0000 | MEDICATED_PATCH | TRANSDERMAL | Status: DC

## 2020-07-16 MED ORDER — SODIUM CHLORIDE 0.9 % IV SOLN: INTRAVENOUS | Status: DC

## 2020-07-16 MED ORDER — FENTANYL 50 MCG/HR TD PT72
1.0000 | MEDICATED_PATCH | TRANSDERMAL | Status: DC
  Administered 2020-07-18: 1 via TRANSDERMAL
  Filled 2020-07-16 (×3): qty 1

## 2020-07-16 MED ORDER — CHLORHEXIDINE GLUCONATE CLOTH 2 % EX PADS
6.0000 | MEDICATED_PAD | Freq: Every day | CUTANEOUS | Status: DC
  Administered 2020-07-16 – 2020-07-17 (×2): 6 via TOPICAL

## 2020-07-16 MED ORDER — DILTIAZEM LOAD VIA INFUSION
10.0000 mg | Freq: Once | INTRAVENOUS | Status: AC
  Administered 2020-07-16: 10 mg via INTRAVENOUS
  Filled 2020-07-16: qty 10

## 2020-07-16 MED ORDER — IOHEXOL 300 MG/ML  SOLN
75.0000 mL | Freq: Once | INTRAMUSCULAR | Status: AC | PRN
  Administered 2020-07-16: 75 mL via INTRAVENOUS

## 2020-07-16 MED ORDER — LORAZEPAM 2 MG/ML IJ SOLN: 0.5000 mg | INTRAMUSCULAR | Status: DC | PRN

## 2020-07-16 NOTE — ED Triage Notes (Signed)
Pt arrives to ED with c/o of choking on a potassium pill around 11 on Sunday was seen at [facility] and released from ER Monday morning around 6am. Pt arrives with her daughter who reports she has been unable to swallow at home and has been coughing up mucus. Pt has very horse voice and noisy respirations. Pt is alert and ox4 and no respiratory distress. o2 sat's currently 100%.

## 2020-07-16 NOTE — ED Notes (Signed)
MD Alvino Chapel made aware of Afib RVR rate 130-150.

## 2020-07-16 NOTE — Progress Notes (Signed)
eLink Physician-Brief Progress Note Patient Name: [name] [name] DOB: 1941-11-08 MRN: [id number]   Date of Service  07/16/2020  HPI/Events of Note  Request for something for anxiety as patient going down for CT.   Patient does appear anxious  eICU Interventions  Ordered a one time dose of ativan 1 mg IV on call to CT     Intervention Category Minor Interventions: Agitation / anxiety - evaluation and management  Judd Lien 07/16/2020, 8:37 PM

## 2020-07-16 NOTE — H&P (Addendum)
NAME:  [name] [name], MRN:  [id number], DOB:  Nov 20, 1941, LOS: 0 ADMISSION DATE:  07/16/2020, CONSULTATION DATE: 7/27 REFERRING MD: Alvino Chapel, CHIEF COMPLAINT: Acute respiratory distress  Brief History   This is a 79 year old female with a known history of hiatal hernia, esophageal and oral pharyngeal dysphagia.  On 7/26 had an episode where she "choked on a potassium pill", she developed progressive increased work of breathing, copious upper airway secretions, and worsening turbulent upper airway noises in spite of initial soft tissue neck imaging without evidence of airway obstruction.  Critical care asked to admit as patient continued to report worsening shortness of breath, and developed new atrial fibrillation with RVR while in the emergency room  History of present illness   This is an 79 year old white female with history as mentioned below presented to the emergency room On 7/26 after choking on a potassium pill at approximately 11 AM that morning.  She arrived to the emergency room at about 2 AM, reporting progressively sore throat, feeling like it was "on fire" apparently nursing staff was called out to the waiting room where they found the patient lying on the floor saying she could not get up.  She had to be transported to triage for evaluation.  She underwent chest x-ray as well as soft tissue of neck there was no visual foreign body or airway swelling, chest x-ray was clear she was released to home.  Her symptoms continued to worsen she therefore once again reported to the emergency room on 7/27 reporting feeling that she is choking, had persistent cough with clear mucus loud rhonchorous upper airway noises, and new rapid ventricular response on telemetry.  She was very anxious, with marked distress and because of this pulmonary was asked to evaluate  Past Medical History  Hiatal hernia, irritable bowel syndrome, previously identified oropharyngeal dysphagia, anxiety and depression,  chronic diastolic heart failure, , Coronary artery disease with prior PCI, permanent atrial fibrillation on calcium channel blocker and DOAC, hypertension, hyperlipidemia hepatic steatosis, anemia, esophageal stricture, GERD  Significant Hospital Events   7/26 choking event on potassium pill, seen, imaging all without evidence of upper airway obstruction or aspiration discharged to home 7/27 back, continues to complain of sore throat, cannot breathe, when she coughs.  Copious oral secretions, developed rapid ventricular response on telemetry  Consults:    Procedures:    Significant Diagnostic Tests:  CT soft tissue neck 7/27 CT chest 7/27   Micro Data:    Antimicrobials:     Interim history/subjective:  Reports marked dyspnea and choking  Objective   Blood pressure 116/70, pulse (Abnormal) 133, temperature 98.4 F (36.9 C), temperature source Oral, resp. rate 19, height 5' 7"  (1.702 m), weight 83.5 kg, SpO2 98 %.       No intake or output data in the 24 hours ending 07/16/20 1234 Filed Weights   07/16/20 1033  Weight: 83.5 kg    Examination: General: This is a very anxious 79 year old white female lying in bed she continues to cough, bringing clear mucus HENT: Normocephalic atraumatic she has marked turbulent upper airway inspiratory and expiratory rhonchi, no crepitus palpable Lungs: Scattered rhonchi, some accessory use with anxiety currently on room air Cardiovascular: Tachycardic irregular irregular with atrial fibrillation Abdomen: Soft not tender Extremities: Warm and dry brisk capillary refill Neuro: Awake oriented very anxious GU: Voids spontaneously  Resolved Hospital Problem list     Assessment & Plan:  Dyspnea with cough; given history and anxiety suspect this is more exacerbation  of laryngal pharyngeal reflux disease and upper airway irritation, but will rule out upper airway obstruction Plan Admit to intensive care overnight for monitoring, with plan  for internal medicine to assume her care in the morning Supplemental oxygen via face tent Oral suctioning as needed N.p.o. Reflux precautions keeping head of bed elevated PPI IV twice daily CT soft tissue neck and chest Pulse oximetry Further interventions to be determined based on imaging May be beneficial to have esophagram at some point  H/o hiatal hernia as well as both oral pharyngeal and esophageal dysphagia Plan NPO HOB 30 degrees PPI BID Consider gastrogram when breathing easier   Atrial fibrillation with RVR Plan We will start low-dose Cardizem drip Titrate for heart rate less than 110 Supportive care Continue telemetry monitoring Holding her Eliquis for now given inability to take oral, will hold off on IV heparin for now  History of diastolic heart failure and coronary artery disease Plan Holding diuretics for now Telemetry monitoring  Anxiety Suspect this is exacerbating her work of breathing Plan Very low-dose Ativan could be considered but will try to avoid as has had sensitivity w/ valium causing hallucinations. Supportive care    Best practice:  Diet: *NPO Pain/Anxiety/Delirium protocol (if indicated): NA VAP protocol (if indicated): NA DVT prophylaxis: scd-->eval to resume DOAC when able to take PO GI prophylaxis: PPI BID  Glucose control: NA Mobility: BR, HOB elevated  Code Status: full code Family Communication: daughter updated  Disposition: ICU over night [facility] am   Labs   CBC: Recent Labs  Lab 07/16/20 1050  WBC 10.6*  HGB 13.1  HCT 42.0  MCV 98.1  PLT 681    Basic Metabolic Panel: Recent Labs  Lab 07/16/20 1050  NA 139  K 3.7  CL 99  CO2 26  GLUCOSE 132*  BUN 10  CREATININE 0.89  CALCIUM 9.6   GFR: Estimated Creatinine Clearance: 56 mL/min (by C-G formula based on SCr of 0.89 mg/dL). Recent Labs  Lab 07/16/20 1050  WBC 10.6*    Liver Function Tests: No results for input(s): AST, ALT, ALKPHOS, BILITOT, PROT,  ALBUMIN in the last 168 hours. No results for input(s): LIPASE, AMYLASE in the last 168 hours. No results for input(s): AMMONIA in the last 168 hours.  ABG    Component Value Date/Time   PHART 7.381 04/25/2008 1309   PCO2ART 45.4 (H) 04/25/2008 1309   PO2ART 65.0 (L) 04/25/2008 1309   HCO3 26.9 (H) 04/25/2008 1309   TCO2 24 03/07/2011 1526   O2SAT 92.0 04/25/2008 1309     Coagulation Profile: No results for input(s): INR, PROTIME in the last 168 hours.  Cardiac Enzymes: No results for input(s): CKTOTAL, CKMB, CKMBINDEX, TROPONINI in the last 168 hours.  HbA1C: Hemoglobin A1C  Date/Time Value Ref Range Status  08/21/2014 11:58 AM 5.6  Final  04/24/2014 02:22 PM 5.3%  Final    Comment:    normal range 4.2-6.3%   Hgb A1c MFr Bld  Date/Time Value Ref Range Status  07/20/2019 12:52 PM 6.1 (H) 4.8 - 5.6 % Final    Comment:             Prediabetes: 5.7 - 6.4          Diabetes: >6.4          Glycemic control for adults with diabetes: <7.0     CBG: No results for input(s): GLUCAP in the last 168 hours.  Review of Systems:   Review of Systems  Constitutional: Positive  for diaphoresis, fever and weight loss. Negative for chills.  HENT: Positive for sore throat.   Eyes: Negative.   Respiratory: Positive for cough, sputum production, shortness of breath, wheezing and stridor.   Cardiovascular: Positive for chest pain.  Gastrointestinal: Positive for heartburn and nausea.  Genitourinary: Negative.   Musculoskeletal: Negative.   Skin: Negative.   Endo/Heme/Allergies: Negative.   Psychiatric/Behavioral: The patient is nervous/anxious.      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), CAD (coronary artery disease), Chronic atrial fibrillation (HCC), Chronic back pain greater than 3 months duration, Chronic diastolic CHF,  Circadian rhythm sleep disorder, CTS (carpal tunnel syndrome), Deficiency anemia (11/10/2014), Depression, Diarrhea, Diverticulitis of colon, Esophageal stricture, Gastritis, Gastroesophageal hernia (07/06/2013), GERD (gastroesophageal reflux disease), Hiatal hernia, History of blood transfusion, HTN (hypertension), Hyperlipidemia, IBS (irritable bowel syndrome), Incontinence (10/13/2012), Insomnia, Ischemic chest pain ([place]), Memory disorder (16/09/9603), Metabolic syndrome (04/23/980), MGUS (monoclonal gammopathy of unknown significance) (dx'd 11/2014), Multiple falls, Obesity, Obstructive sleep apnea, Orthostasis, PAT (paroxysmal atrial tachycardia) ([place]), Personal history of colonic polyps (10/25/2011 & 12/02/11), Pneumonia (03/2014), Sinus bradycardia, and Stroke ([place]) (early 2000's).   Surgical History    Past Surgical History:  Procedure Laterality Date  . BACK SURGERY    . BONE MARROW BIOPSY  11/2014  . CARDIAC CATHETERIZATION  12/27/2014   Procedure: INTRAVASCULAR PRESSURE WIRE/FFR STUDY;  Surgeon: Larey Dresser, MD;  Location: [facility] CATH LAB;  Service: Cardiovascular;;  . CARDIAC CATHETERIZATION N/A 11/25/2016   Procedure: Left Heart Cath and Coronary Angiography;  Surgeon: Sherren Mocha, MD;  Location: [facility] CV LAB;  Service: Cardiovascular;  Laterality: N/A;  . CARDIOVERSION N/A 02/13/2016   Procedure: CARDIOVERSION;  Surgeon: Josue Hector, MD;  Location: [facility] ENDOSCOPY;  Service: Cardiovascular;  Laterality: N/A;  . CARPAL TUNNEL RELEASE Right 1980's  . CATARACT EXTRACTION Bilateral   . CATARACT EXTRACTION W/ INTRAOCULAR LENS  IMPLANT, BILATERAL  2000's  . CORONARY ANGIOPLASTY WITH STENT PLACEMENT  01/07/2015   "2"  . CORONARY STENT PLACEMENT    . DILATION AND CURETTAGE OF UTERUS    . ESOPHAGOGASTRODUODENOSCOPY (EGD) WITH ESOPHAGEAL DILATION  "several times"  . JOINT REPLACEMENT    . KNEE ARTHROSCOPY Left 1995  . LAPAROSCOPIC CHOLECYSTECTOMY  2003  . LEFT HEART CATHETERIZATION WITH  CORONARY ANGIOGRAM N/A 12/27/2014   Procedure: LEFT HEART CATHETERIZATION WITH CORONARY ANGIOGRAM;  Surgeon: Larey Dresser, MD;  Location: [facility] CATH LAB;  Service: Cardiovascular;  Laterality: N/A;  . PERCUTANEOUS CORONARY ROTOBLATOR INTERVENTION (PCI-R)  01/07/2015  . PERCUTANEOUS CORONARY ROTOBLATOR INTERVENTION (PCI-R) N/A 01/07/2015   Procedure: PERCUTANEOUS CORONARY ROTOBLATOR INTERVENTION (PCI-R);  Surgeon: Blane Ohara, MD;  Location: [facility] CATH LAB;  Service: Cardiovascular;  Laterality: N/A;  . POSTERIOR FUSION THORACIC SPINE  08/2015  . POSTERIOR LUMBAR FUSION  01/2015  . TOTAL KNEE ARTHROPLASTY Bilateral 1990's - 2000's     Social History   reports that she has never smoked. She has never used smokeless tobacco. She reports that she does not drink alcohol and does not use drugs.   Family History   Her family history includes Colon cancer in her sister and sister; Coronary artery disease in her brother, father, and mother; Emphysema in her brother and sister; Peripheral vascular disease in her father; Sleep apnea in her son. There is no history of Dementia.   Allergies Allergies  Allergen Reactions  . Buprenorphine Hcl Itching  .  Cymbalta [Duloxetine Hcl] Other (See Comments)    Other reaction(s): Other (See Comments) Personality changes - crying  2014  . Morphine And Related Itching  . Oxycodone-Acetaminophen Other (See Comments)    Personality changes   . Valium [Diazepam] Other (See Comments)    Personality changes - "in another world" ;  Hallucinations  2015  . Statins Other (See Comments)    Other reaction(s): Other (See Comments) Muscle weakness Muscle weakness  . Altace [Ramipril] Other (See Comments)    unknown  . Floxin [Ofloxacin] Other (See Comments)    unknown  . Hydromorphone Other (See Comments)    Other reaction(s): Confusion (intolerance) unknown  . Lovaza [Omega-3-Acid Ethyl Esters] Other (See Comments)    Muscle weakness   . Trilipix [Choline  Fenofibrate] Other (See Comments)    Muscle weakness   . Zetia [Ezetimibe] Other (See Comments)    Muscle weakness      Home Medications  Prior to Admission medications   Medication Sig Start Date End Date Taking? Authorizing Provider  aspirin EC 81 MG tablet Take 1 tablet (81 mg total) by mouth daily. 11/23/16  Yes Isaiah Serge, NP  Calcium Citrate-Vitamin D (CALCIUM + D PO) Take 1 tablet by mouth daily.   Yes [provider]  Cholecalciferol (VITAMIN D) 2000 units tablet Take 2,000 Units by mouth daily.   Yes [provider]  clobetasol cream (TEMOVATE) 9.19 % Apply 1 application topically See admin instructions. Applies to vaginal area twice a week. 09/23/16  Yes [provider]  diltiazem (CARDIZEM CD) 120 MG 24 hr capsule Take 1 capsule (120 mg total) by mouth 2 (two) times daily. 07/20/19  Yes Dettinger, Fransisca Kaufmann, MD  ELIQUIS 5 MG TABS tablet Take 1 tablet (5 mg total) by mouth 2 (two) times daily. 04/26/20  Yes Gottschalk, Ashly M, DO  Eszopiclone 3 MG TABS Take 1 tablet (3 mg total) by mouth at bedtime. For insomnia,take immediately before bedtime. 07/20/19  Yes Dettinger, Fransisca Kaufmann, MD  fentaNYL (DURAGESIC - DOSED MCG/HR) 50 MCG/HR Place 1 patch (50 mcg total) onto the skin every 3 (three) days. 10/31/14  Yes [name], MD  FLUoxetine (PROZAC) 40 MG capsule Take 1 capsule (40 mg total) by mouth daily. 07/20/19  Yes Dettinger, Fransisca Kaufmann, MD  furosemide (LASIX) 80 MG tablet Take 1 tablet (80 mg total) by mouth daily. 07/20/19 07/16/20 Yes Dettinger, Fransisca Kaufmann, MD  HYDROcodone-acetaminophen (NORCO) 10-325 MG per tablet Take 1 tablet by mouth every 4 (four) hours as needed for moderate pain.    Yes [provider]  Multiple Vitamin (MULTIVITAMIN) tablet Take 1 tablet by mouth daily. For supplement   Yes [provider]  nitroGLYCERIN (NITROSTAT) 0.4 MG SL tablet Place 1 tablet (0.4 mg total) under the tongue every 5 (five) minutes as needed for  chest pain (up to 3 doses). 10/02/19  Yes Weaver, Nicki Reaper T, PA-C  NONFORMULARY OR COMPOUNDED ITEM Antifungal solution: Terbinafine 3%, Fluconazole 2%, Tea Tree Oil 5%, Urea 10%, Ibuprofen 2% in DMSO suspension #53m Patient taking differently: Apply 1 application topically every other day. Antifungal solution: Terbinafine 3%, Fluconazole 2%, Tea Tree Oil 5%, Urea 10%, Ibuprofen 2% in DMSO suspension #337m For toe fungus 06/17/20  Yes [place], JeStephani PoliceDPM  pantoprazole (PROTONIX) 40 MG tablet Take 1 tablet (40 mg total) by mouth 2 (two) times daily. 07/20/19  Yes Dettinger, JoFransisca KaufmannMD  potassium chloride SA (K-DUR) 20 MEQ tablet Take 1 tablet (20 mEq  total) by mouth daily. 07/20/19  Yes Dettinger, Fransisca Kaufmann, MD  rosuvastatin (CRESTOR) 10 MG tablet Take 1 tablet (10 mg total) by mouth daily. Patient taking differently: Take 5-10 mg by mouth daily. Alternating doses. 10/25/19  Yes Dettinger, Fransisca Kaufmann, MD  triamcinolone cream (KENALOG) 0.1 % Apply 1 application topically 3 (three) times daily.  06/10/20  Yes [provider]  XIFAXAN 550 MG TABS tablet Take 550 mg by mouth 2 (two) times daily. X 10 days. 07/08/20  Yes [provider]  diphenoxylate-atropine (LOMOTIL) 2.5-0.025 MG per tablet Take 1 tablet by mouth 4 (four) times daily as needed for diarrhea or loose stools.  Patient not taking: Reported on 07/16/2020 08/17/14   [provider]  Ferrous Sulfate (IRON PO) Take 1 tablet by mouth See admin instructions. Take one tablet by mouth daily (OTC Supplement)  Patient not taking: Reported on 07/16/2020    [provider]  methocarbamol (ROBAXIN) 500 MG tablet Take 500 mg by mouth 4 (four) times daily as needed for muscle spasms. Patient not taking: Reported on 07/16/2020    [provider]     Critical care time: 32 min   Erick Colace ACNP-BC [place] Pager # [phone] OR # [phone] if no answer

## 2020-07-16 NOTE — ED Provider Notes (Signed)
[facility] EMERGENCY DEPARTMENT Provider Note   CSN: [id number] Arrival date & time: 07/16/20  1027     History Chief Complaint  Patient presents with  . Choking    [name] [name] is a 79 y.o. female.  HPI Patient presents after choking episode.  Choked on a potassium pill on Sunday.  Seen at [facility] and negative x-rays and was reported feeling okay and discharged home Monday morning.  Since then unable to swallow and more coughing.  Also difficulty breathing with a change in her voice.  Patient states she feels bad.  States it feels as if something is wrong with her throat.  History of chronic atrial fibrillation and is on anticoagulation.  She has had her Covid vaccinations.  Also pain in left lateral lower chest wall    Past Medical History:  Diagnosis Date  . Abnormal nuclear cardiac imaging test   . Anemia   . Anemia, iron deficiency 07/02/2015  . Anxiety   . Arthritis    "knees, back, fingers, toes; joints" (01/07/2015)  . 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 CAD (coronary artery disease)    a. Abnl nuc 11/2014. Cath 12/2014 - turned down for CABG. Ultimately s/p TTVP, rotational atherectomy, PTCA and stenting of the ostial LCx and left main into the LAD (crush technique), and IVUS of the LAD/Left main. // b. Myoview 11/17: EF 48, poor quality/significant artifact; inf-lateral, inferior ischemia; Intermediate Risk  . Chronic atrial fibrillation ([place])    a.  First noted post-op 9/15 spinal fusion. She had cardioversion, not on anticoagulation. Fall risk, unsteady. // failed DCCV // Holter 10/17: AFib, Avg HR 97, PVCs, no other arrhythmia  . Chronic back pain greater than 3 months duration    a. spinal stenosis. Spinal fusion with rods in 2/15 at [facility] spinal fusion 9/15.  Marland Kitchen Chronic diastolic CHF    Echo 4/09:  EF 50-55%, trivial AI, midl MR, mod LAE, PASP 37 mmHg // Echocardiogram 03/2020: EF 45-50, no RWMA, mild LVH, mild reduced RVSF, mildly  elevated PASP (RVSP 38.3), mild LAE, mild MR, mild to mod TR, trivial AI   . Circadian rhythm sleep disorder   . CTS (carpal tunnel syndrome)   . Deficiency anemia 11/10/2014  . Depression   . Diarrhea   . Diverticulitis of colon   . Esophageal stricture   . Gastritis   . Gastroesophageal hernia 07/06/2013  . GERD (gastroesophageal reflux disease)   . Hiatal hernia   . History of blood transfusion    "most of them related to OR's"   . HTN (hypertension)   . Hyperlipidemia   . IBS (irritable bowel syndrome)   . Incontinence 10/13/2012  . Insomnia   . Ischemic chest pain ([place])   . Memory disorder 12/04/2014  . Metabolic syndrome 07/[birth year]/1913  . MGUS (monoclonal gammopathy of unknown significance) dx'd 11/2014   a. Neg BMB 11/2014.  . Multiple falls   . Obesity   . Obstructive sleep apnea    "have mask; don't wear it" (01/07/2015)  . Orthostasis   . PAT (paroxysmal atrial tachycardia) ([place])   . Personal history of colonic polyps 10/25/2011 & 12/02/11   not retrieved Dr Lyla Son & tubular adenomas  . Pneumonia 03/2014  . Sinus bradycardia    a. Baseline HR 50s-60s.  . Stroke [facility]) early 2000's   "small"; denies residual on 01/07/2015)    Patient Active Problem List   Diagnosis Date Noted  . Dysphagia 03/25/2020  .  HOH (hard of hearing) 03/25/2020  . Lichen sclerosus et atrophicus of the vulva 01/03/2019  . Chronic pain syndrome 02/14/2018  . Long-term current use of opiate analgesic 02/14/2018  . Anxiety 07/21/2016  . Chronic diastolic CHF (congestive heart failure) ([place]) 07/17/2015  . DDD (degenerative disc disease), lumbar 03/22/2015  . Fall 03/22/2015  . 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 Difficulty hearing 03/22/2015  . Lumbar scoliosis 03/22/2015  . History of pelvic fracture 03/22/2015  . History of other specified conditions presenting hazards to health 03/22/2015  . MGUS (monoclonal gammopathy of unknown significance) 01/08/2015  . Permanent atrial fibrillation  ([place]) 12/22/2014  . CAD (coronary artery disease) 12/22/2014  . Memory disorder 12/04/2014  . Angulation of spine 09/18/2014  . Metabolic syndrome 47/65/4650  . S/P spinal fusion 02/23/2014  . Chronic pain associated with significant psychosocial dysfunction 02/23/2014  . Gastroesophageal hernia 07/06/2013  . Incontinence 10/13/2012  . 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 DEGENERATIVE JOINT DISEASE 04/08/2010  . Obesity (BMI 30.0-34.9) 04/04/2009  . CIRCADIAN RHYTHM SLEEP D/O DELAY SLEEP PHSE TYPE 04/04/2009  . CARPAL TUNNEL SYNDROME 04/04/2009  . IRRITABLE BOWEL SYNDROME 11/13/2008  . ESOPHAGEAL STRICTURE 11/12/2008  . GERD 11/12/2008  . Paralysis of diaphragm 11/12/2008  . DIVERTICULOSIS, COLON 11/12/2008  . Obstructive sleep apnea 05/23/2008  . Hyperlipidemia 05/22/2008  . Depression 05/22/2008  . Essential hypertension 05/22/2008  . INSOMNIA 05/22/2008    Past Surgical History:  Procedure Laterality Date  . BACK SURGERY    . BONE MARROW BIOPSY  11/2014  . CARDIAC CATHETERIZATION  12/27/2014   Procedure: INTRAVASCULAR PRESSURE WIRE/FFR STUDY;  Surgeon: Larey Dresser, MD;  Location: [facility] CATH LAB;  Service: Cardiovascular;;  . CARDIAC CATHETERIZATION N/A 11/25/2016   Procedure: Left Heart Cath and Coronary Angiography;  Surgeon: Sherren Mocha, MD;  Location: [facility] CV LAB;  Service: Cardiovascular;  Laterality: N/A;  . CARDIOVERSION N/A 02/13/2016   Procedure: CARDIOVERSION;  Surgeon: Josue Hector, MD;  Location: [facility] ENDOSCOPY;  Service: Cardiovascular;  Laterality: N/A;  . CARPAL TUNNEL RELEASE Right 1980's  . CATARACT EXTRACTION Bilateral   . CATARACT EXTRACTION W/ INTRAOCULAR LENS  IMPLANT, BILATERAL  2000's  . CORONARY ANGIOPLASTY WITH STENT PLACEMENT  01/07/2015   "2"  . CORONARY STENT PLACEMENT    . DILATION AND CURETTAGE OF UTERUS    . ESOPHAGOGASTRODUODENOSCOPY (EGD) WITH ESOPHAGEAL DILATION  "several times"  . JOINT REPLACEMENT    . KNEE ARTHROSCOPY Left  1995  . LAPAROSCOPIC CHOLECYSTECTOMY  2003  . LEFT HEART CATHETERIZATION WITH CORONARY ANGIOGRAM N/A 12/27/2014   Procedure: LEFT HEART CATHETERIZATION WITH CORONARY ANGIOGRAM;  Surgeon: Larey Dresser, MD;  Location: [facility] CATH LAB;  Service: Cardiovascular;  Laterality: N/A;  . PERCUTANEOUS CORONARY ROTOBLATOR INTERVENTION (PCI-R)  01/07/2015  . PERCUTANEOUS CORONARY ROTOBLATOR INTERVENTION (PCI-R) N/A 01/07/2015   Procedure: PERCUTANEOUS CORONARY ROTOBLATOR INTERVENTION (PCI-R);  Surgeon: Blane Ohara, MD;  Location: [facility] CATH LAB;  Service: Cardiovascular;  Laterality: N/A;  . POSTERIOR FUSION THORACIC SPINE  08/2015  . POSTERIOR LUMBAR FUSION  01/2015  . TOTAL KNEE ARTHROPLASTY Bilateral 1990's - 2000's     OB History   No obstetric history on file.     Family History  Problem Relation Age of Onset  . Coronary artery disease Father   . Peripheral vascular disease Father   . Coronary artery disease Mother   . Coronary artery disease Brother   . Colon cancer Sister   . Emphysema Sister   . Sleep apnea Son   .  Colon cancer Sister        spread to her brain  . Emphysema Brother   . Dementia Neg Hx     Social History   Tobacco Use  . Smoking status: Never Smoker  . Smokeless tobacco: Never Used  Vaping Use  . Vaping Use: Never used  Substance Use Topics  . Alcohol use: No    Comment: 01/07/2015 "glass of wine at Christmas, maybe"  . Drug use: No    Home Medications Prior to Admission medications   Medication Sig Start Date End Date Taking? Authorizing Provider  aspirin EC 81 MG tablet Take 1 tablet (81 mg total) by mouth daily. 11/23/16  Yes Isaiah Serge, NP  Calcium Citrate-Vitamin D (CALCIUM + D PO) Take 1 tablet by mouth daily.   Yes [provider]  Cholecalciferol (VITAMIN D) 2000 units tablet Take 2,000 Units by mouth daily.   Yes [provider]  clobetasol cream (TEMOVATE) 1.10 % Apply 1 application topically See admin instructions. Applies to  vaginal area twice a week. 09/23/16  Yes [provider]  diltiazem (CARDIZEM CD) 120 MG 24 hr capsule Take 1 capsule (120 mg total) by mouth 2 (two) times daily. 07/20/19  Yes Dettinger, Fransisca Kaufmann, MD  ELIQUIS 5 MG TABS tablet Take 1 tablet (5 mg total) by mouth 2 (two) times daily. 04/26/20  Yes Gottschalk, Ashly M, DO  Eszopiclone 3 MG TABS Take 1 tablet (3 mg total) by mouth at bedtime. For insomnia,take immediately before bedtime. 07/20/19  Yes Dettinger, Fransisca Kaufmann, MD  fentaNYL (DURAGESIC - DOSED MCG/HR) 50 MCG/HR Place 1 patch (50 mcg total) onto the skin every 3 (three) days. 10/31/14  Yes [name], MD  FLUoxetine (PROZAC) 40 MG capsule Take 1 capsule (40 mg total) by mouth daily. 07/20/19  Yes Dettinger, Fransisca Kaufmann, MD  furosemide (LASIX) 80 MG tablet Take 1 tablet (80 mg total) by mouth daily. 07/20/19 07/16/20 Yes Dettinger, Fransisca Kaufmann, MD  HYDROcodone-acetaminophen (NORCO) 10-325 MG per tablet Take 1 tablet by mouth every 4 (four) hours as needed for moderate pain.    Yes [provider]  Multiple Vitamin (MULTIVITAMIN) tablet Take 1 tablet by mouth daily. For supplement   Yes [provider]  nitroGLYCERIN (NITROSTAT) 0.4 MG SL tablet Place 1 tablet (0.4 mg total) under the tongue every 5 (five) minutes as needed for chest pain (up to 3 doses). 10/02/19  Yes Weaver, Nicki Reaper T, PA-C  NONFORMULARY OR COMPOUNDED ITEM Antifungal solution: Terbinafine 3%, Fluconazole 2%, Tea Tree Oil 5%, Urea 10%, Ibuprofen 2% in DMSO suspension #81m Patient taking differently: Apply 1 application topically every other day. Antifungal solution: Terbinafine 3%, Fluconazole 2%, Tea Tree Oil 5%, Urea 10%, Ibuprofen 2% in DMSO suspension #355m For toe fungus 06/17/20  Yes [place], JeStephani PoliceDPM  pantoprazole (PROTONIX) 40 MG tablet Take 1 tablet (40 mg total) by mouth 2 (two) times daily. 07/20/19  Yes Dettinger, JoFransisca KaufmannMD  potassium chloride SA (K-DUR) 20 MEQ tablet Take 1 tablet (20 mEq  total) by mouth daily. 07/20/19  Yes Dettinger, JoFransisca KaufmannMD  rosuvastatin (CRESTOR) 10 MG tablet Take 1 tablet (10 mg total) by mouth daily. Patient taking differently: Take 5-10 mg by mouth daily. Alternating doses. 10/25/19  Yes Dettinger, JoFransisca KaufmannMD  triamcinolone cream (KENALOG) 0.1 % Apply 1 application topically 3 (three) times daily.  06/10/20  Yes [provider]  XIFAXAN 550 MG TABS tablet Take 550 mg by mouth 2 (  two) times daily. X 10 days. 07/08/20  Yes [provider]  diphenoxylate-atropine (LOMOTIL) 2.5-0.025 MG per tablet Take 1 tablet by mouth 4 (four) times daily as needed for diarrhea or loose stools.  Patient not taking: Reported on 07/16/2020 08/17/14   [provider]  Ferrous Sulfate (IRON PO) Take 1 tablet by mouth See admin instructions. Take one tablet by mouth daily (OTC Supplement)  Patient not taking: Reported on 07/16/2020    [provider]  methocarbamol (ROBAXIN) 500 MG tablet Take 500 mg by mouth 4 (four) times daily as needed for muscle spasms. Patient not taking: Reported on 07/16/2020    [provider]    Allergies    Buprenorphine hcl, Cymbalta [duloxetine hcl], Morphine and related, Oxycodone-acetaminophen, Valium [diazepam], Statins, Altace [ramipril], Floxin [ofloxacin], Hydromorphone, Lovaza [omega-3-acid ethyl esters], Trilipix [choline fenofibrate], and Zetia [ezetimibe]  Review of Systems   Review of Systems  Constitutional: Positive for fatigue. Negative for fever.  HENT: Positive for trouble swallowing and voice change.   Respiratory: Positive for cough and shortness of breath.   Cardiovascular: Positive for chest pain. Negative for leg swelling.  Gastrointestinal: Negative for abdominal pain.  Genitourinary: Negative for flank pain.  Musculoskeletal: Positive for back pain.  Skin: Negative for rash.  Neurological: Negative for weakness.    Physical Exam Updated Vital Signs BP 116/70   Pulse (!) 133    Temp 98.4 F (36.9 C) (Oral)   Resp 19   Ht 5' 7"  (1.702 m)   Wt 83.5 kg   SpO2 98%   BMI 28.82 kg/m   Physical Exam Vitals and nursing note reviewed.  HENT:     Head: Atraumatic.     Mouth/Throat:     Comments: Patient with change in her voice.  Harsh voice.  No posterior pharyngeal edema seen. Cardiovascular:     Rate and Rhythm: Tachycardia present. Rhythm irregular.  Pulmonary:     Comments: Diffuse harsh breath sounds with mostly expiratory sounds. Abdominal:     Tenderness: There is no abdominal tenderness.  Musculoskeletal:     Cervical back: Neck supple.     Right lower leg: Edema present.     Left lower leg: Edema present.  Skin:    General: Skin is warm.     Capillary Refill: Capillary refill takes less than 2 seconds.  Neurological:     Mental Status: She is alert and oriented to person, place, and time.     ED Results / Procedures / Treatments   Labs (all labs ordered are listed, but only abnormal results are displayed) Labs Reviewed  BASIC METABOLIC PANEL - Abnormal; Notable for the following components:      Result Value   Glucose, Bld 132 (*)    All other components within normal limits  CBC - Abnormal; Notable for the following components:   WBC 10.6 (*)    All other components within normal limits  SARS CORONAVIRUS 2 BY RT PCR [facility] ORDER, [place] LAB)    EKG EKG Interpretation  Date/Time:  Tuesday July 16 2020 12:00:54 EDT Ventricular Rate:  134 PR Interval:    QRS Duration: 77 QT Interval:  322 QTC Calculation: 481 R Axis:   32 Text Interpretation: Atrial fibrillation Baseline wander in lead(s) V3 V6 Confirmed by Davonna Belling [phone]) on 07/16/2020 12:41:46 PM   Radiology DG Neck Soft Tissue  Result Date: 07/16/2020 CLINICAL DATA:  Difficulty swallowing EXAM: NECK SOFT TISSUES - 1+ VIEW COMPARISON:  July 15, 2020 FINDINGS: The cervical spine is visualized from C1-C7. Partial visualization of upper  thoracic spine posterior fixation hardware.Cervical alignment is maintained. Vertebral body heights are maintained: no evidence of acute fracture. Multilevel intervertebral disc space height loss and osteophyte proliferation. Bilateral facet arthropathy. Osteopenia. LEFT carotid atherosclerotic calcifications. No prevertebral soft tissue swelling. Visualized thorax is unremarkable. IMPRESSION: 1. No prevertebral soft tissue edema. Consider dedicated speech swallow evaluation given indication of choking and difficulty swallowing. 2. No evidence of acute osseous abnormality. 3. LEFT carotid atherosclerotic calcifications. Electronically Signed   By: Valentino Saxon MD   On: 07/16/2020 11:27   DG Neck Soft Tissue  Result Date: 07/15/2020 CLINICAL DATA:  Choked on a pill.  Sore throat EXAM: NECK SOFT TISSUES - 1+ VIEW COMPARISON:  None. FINDINGS: Densities over the throat are attributed to cartilage calcification. No ovoid density typical of a retained pill. No detected airway swelling or narrowing. Cervical spine degeneration in keeping with age. Upper thoracic spine fusion. IMPRESSION: No visible foreign body or upper airway swelling. Electronically Signed   By: Monte Fantasia M.D.   On: 07/15/2020 04:49   DG Chest 2 View  Result Date: 07/16/2020 CLINICAL DATA:  Shortness of breath EXAM: CHEST - 2 VIEW COMPARISON:  07/15/2020 FINDINGS: Stable mild cardiomegaly. Known large hiatal hernia was better seen on prior imaging. Unchanged eventration of the anterior aspect of the right hemidiaphragm. No focal airspace consolidation, pleural effusion, or pneumothorax. Extensive thoracolumbar fusion hardware. Degenerative changes of the shoulders, right worse than left. IMPRESSION: No active cardiopulmonary disease.  No interval change prior. Electronically Signed   By: Davina Poke D.O.   On: 07/16/2020 11:05   DG Chest 2 View  Result Date: 07/15/2020 CLINICAL DATA:  Choked on a potassium pill with  epigastric pain and throat burning EXAM: CHEST - 2 VIEW COMPARISON:  04/03/2020 FINDINGS: Extensive thoracic and lumbar spine fusion. No cardiomegaly when accounting for a large hiatal hernia. Chronic interstitial reticulation. There is no edema, consolidation, effusion, or pneumothorax. Eventration of the right diaphragm. IMPRESSION: Stable compared to prior.  No evidence of acute disease. Electronically Signed   By: Monte Fantasia M.D.   On: 07/15/2020 04:50    Procedures Procedures (including critical care time)  Medications Ordered in ED Medications  pantoprazole (PROTONIX) injection 40 mg (has no administration in time range)  metoCLOPramide (REGLAN) injection 5 mg (has no administration in time range)    ED Course  I have reviewed the triage vital signs and the nursing notes.  Pertinent labs & imaging results that were available during my care of the patient were reviewed by me and considered in my medical decision making (see chart for details).    MDM Rules/Calculators/A&P                          Patient presents 2 days after choking episode.  Reportedly choked on potassium tablet.  No difficulty breathing change in voice and difficulty swallowing oral intake.  Also history of atrial fibrillation on anticoagulation.  Also admitted for RVR.  X-rays reassuring.  Seen by pulmonary and thinks with continued potential airway involvement may benefit from short-term placement in the ICU.  Will get CT scan of the chest and neck to evaluate.  CRITICAL CARE Performed by: Davonna Belling Total critical care time: 30 minutes Critical care time was exclusive of separately billable procedures and treating other patients. Critical care was necessary to treat or prevent imminent or  life-threatening deterioration. Critical care was time spent personally by me on the following activities: development of treatment plan with patient and/or surrogate as well as nursing, discussions with  consultants, evaluation of patient's response to treatment, examination of patient, obtaining history from patient or surrogate, ordering and performing treatments and interventions, ordering and review of laboratory studies, ordering and review of radiographic studies, pulse oximetry and re-evaluation of patient's condition.  Final Clinical Impression(s) / ED Diagnoses Final diagnoses:  Choking, initial encounter  Atrial fibrillation with rapid ventricular response [facility])    Rx / DC Orders ED Discharge Orders    None       Davonna Belling, MD 07/16/20 1243

## 2020-07-17 DIAGNOSIS — T17308A Unspecified foreign body in larynx causing other injury, initial encounter: Secondary | ICD-10-CM

## 2020-07-17 DIAGNOSIS — R1314 Dysphagia, pharyngoesophageal phase: Secondary | ICD-10-CM

## 2020-07-17 DIAGNOSIS — R0603 Acute respiratory distress: Secondary | ICD-10-CM | POA: Diagnosis not present

## 2020-07-17 DIAGNOSIS — I4821 Permanent atrial fibrillation: Secondary | ICD-10-CM | POA: Diagnosis not present

## 2020-07-17 LAB — BASIC METABOLIC PANEL
Anion gap: 10 (ref 5–15)
BUN: 8 mg/dL (ref 8–23)
CO2: 26 mmol/L (ref 22–32)
Calcium: 8.7 mg/dL — ABNORMAL LOW (ref 8.9–10.3)
Chloride: 103 mmol/L (ref 98–111)
Creatinine, Ser: 0.74 mg/dL (ref 0.44–1.00)
GFR calc Af Amer: >60 mL/min (ref >60)
GFR calc non Af Amer: >60 mL/min (ref >60)
Glucose, Bld: 114 mg/dL — ABNORMAL HIGH (ref 70–99)
Potassium: 3.6 mmol/L (ref 3.5–5.1)
Sodium: 139 mmol/L (ref 135–145)

## 2020-07-17 LAB — APTT: aPTT: 38 seconds — ABNORMAL HIGH (ref 24–36)

## 2020-07-17 LAB — CBC
HCT: 37.6 % (ref 36.0–46.0)
Hemoglobin: 11.8 g/dL — ABNORMAL LOW (ref 12.0–15.0)
MCH: 30.1 pg (ref 26.0–34.0)
MCHC: 31.4 g/dL (ref 30.0–36.0)
MCV: 95.9 fL (ref 80.0–100.0)
Platelets: 193 10*3/uL (ref 150–400)
RBC: 3.92 MIL/uL (ref 3.87–5.11)
RDW: 13.4 % (ref 11.5–15.5)
WBC: 7.6 10*3/uL (ref 4.0–10.5)
nRBC: 0 % (ref 0.0–0.2)

## 2020-07-17 LAB — HEPARIN LEVEL (UNFRACTIONATED): Heparin Unfractionated: 1.09 IU/mL — ABNORMAL HIGH (ref 0.30–0.70)

## 2020-07-17 LAB — GLUCOSE, CAPILLARY
Glucose-Capillary: 103 mg/dL — ABNORMAL HIGH (ref 70–99)
Glucose-Capillary: 70 mg/dL (ref 70–99)
Glucose-Capillary: 77 mg/dL (ref 70–99)
Glucose-Capillary: 89 mg/dL (ref 70–99)
Glucose-Capillary: 90 mg/dL (ref 70–99)
Glucose-Capillary: 98 mg/dL (ref 70–99)
Glucose-Capillary: 99 mg/dL (ref 70–99)

## 2020-07-17 LAB — MAGNESIUM: Magnesium: 2.1 mg/dL (ref 1.7–2.4)

## 2020-07-17 MED ORDER — SALINE SPRAY 0.65 % NA SOLN
2.0000 | Freq: Two times a day (BID) | NASAL | Status: DC
  Administered 2020-07-17 – 2020-07-19 (×4): 2 via NASAL
  Filled 2020-07-17: qty 44

## 2020-07-17 MED ORDER — FLUTICASONE PROPIONATE 50 MCG/ACT NA SUSP
1.0000 | Freq: Every day | NASAL | Status: DC
  Administered 2020-07-17 – 2020-07-19 (×3): 1 via NASAL
  Filled 2020-07-17: qty 16

## 2020-07-17 MED ORDER — MELATONIN 3 MG PO TABS
3.0000 mg | ORAL_TABLET | Freq: Every day | ORAL | Status: DC
  Administered 2020-07-18 – 2020-07-19 (×2): 3 mg via ORAL
  Filled 2020-07-17 (×2): qty 1

## 2020-07-17 MED ORDER — GUAIFENESIN ER 600 MG PO TB12
600.0000 mg | ORAL_TABLET | Freq: Two times a day (BID) | ORAL | Status: DC
  Administered 2020-07-18 – 2020-07-20 (×3): 600 mg via ORAL
  Filled 2020-07-17 (×7): qty 1

## 2020-07-17 MED ORDER — HEPARIN (PORCINE) 25000 UT/250ML-% IV SOLN
1150.0000 [IU]/h | INTRAVENOUS | Status: DC
  Administered 2020-07-17: 1150 [IU]/h via INTRAVENOUS
  Filled 2020-07-17 (×2): qty 250

## 2020-07-17 NOTE — Consult Note (Signed)
Cardiology Consultation:   Patient ID: Tenasia Aull Tribbett; [id number]; [birth year]   Admit date: 07/16/2020 Date of Consult: 07/17/2020  Primary Care Provider: [name], DO Primary Cardiologist: Sherren Mocha, MD 11/28/2018 S. Kathlen Mody, [facility] 05/15/2020 Primary Electrophysiologist:  None   Patient Profile:   Kalimah Capurro Chirico is a 79 y.o. female with a hx of DES LM & [place] 2015, patent at cath 2017, D-CHF, perm Afib, HTN, HLD, orthostatic hypotension, hepatic steatosis, MGUS, HH, dysphagia, who is being seen today for the evaluation of Afib RVR at the request of Dr Dennison Nancy.  History of Present Illness:   Ms. Bebee had a bad coughing and choking episode on Sunday.  Ever since then, she has had a bad raspy cough.  On 7/26, she choked on a potassium tablet and had a sore throat as well as some burning in her epigastric area.  That is what made her finally come to the hospital.  Because of the choking problem, her daughter has not been giving her any of her usual medications.  This includes her cardiac medications of aspirin, diltiazem CD 120 mg twice daily, Eliquis 5 mg twice daily, and Lasix 80 mg per day.  Since being in the hospital, she has not had anything to eat.  She continues to cough regularly, not bringing much of anything up.  No fevers or chills.  They have given her some ice chips, but nothing else p.o.  Her heart rate was elevated on admission, and she was placed on IV Cardizem.  Her heart rate control improved on the IV Cardizem.  Ms. Macneal is not aware of her atrial fibrillation.  She does not have palpitations.  She has not had presyncope or syncope.  She has not had any chest pain.   Past Medical History:  Diagnosis Date  . Abnormal nuclear cardiac imaging test   . Anemia   . Anemia, iron deficiency 07/02/2015  . Anxiety   . Arthritis    "knees, back, fingers, toes; joints" (01/07/2015)  . 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 CAD (coronary artery disease)    a. Abnl nuc  11/2014. Cath 12/2014 - turned down for CABG. Ultimately s/p TTVP, rotational atherectomy, PTCA and stenting of the ostial LCx and left main into the LAD (crush technique), and IVUS of the LAD/Left main. // b. Myoview 11/17: EF 48, poor quality/significant artifact; inf-lateral, inferior ischemia; Intermediate Risk  . Chronic atrial fibrillation ([place])    a.  First noted post-op 9/15 spinal fusion. She had cardioversion, not on anticoagulation. Fall risk, unsteady. // failed DCCV // Holter 10/17: AFib, Avg HR 97, PVCs, no other arrhythmia  . Chronic back pain greater than 3 months duration    a. spinal stenosis. Spinal fusion with rods in 2/15 at [facility] spinal fusion 9/15.  Marland Kitchen Chronic diastolic CHF    Echo 8/88:  EF 50-55%, trivial AI, midl MR, mod LAE, PASP 37 mmHg // Echocardiogram 03/2020: EF 45-50, no RWMA, mild LVH, mild reduced RVSF, mildly elevated PASP (RVSP 38.3), mild LAE, mild MR, mild to mod TR, trivial AI   . Circadian rhythm sleep disorder   . CTS (carpal tunnel syndrome)   . Deficiency anemia 11/10/2014  . Depression   . Diarrhea   . Diverticulitis of colon   . Esophageal stricture   . Gastritis   . Gastroesophageal hernia 07/06/2013  . GERD (gastroesophageal reflux disease)   . Hiatal hernia   . History of blood transfusion    "most of them  related to OR's"   . HTN (hypertension)   . Hyperlipidemia   . IBS (irritable bowel syndrome)   . Incontinence 10/13/2012  . Insomnia   . Ischemic chest pain ([place])   . Memory disorder 12/04/2014  . Metabolic syndrome 08/27/4162  . MGUS (monoclonal gammopathy of unknown significance) dx'd 11/2014   a. Neg BMB 11/2014.  . Multiple falls   . Obesity   . Obstructive sleep apnea    "have mask; don't wear it" (01/07/2015)  . Orthostasis   . PAT (paroxysmal atrial tachycardia) ([place])   . Personal history of colonic polyps 10/25/2011 & 12/02/11   not retrieved Dr Lyla Son & tubular adenomas  . Pneumonia 03/2014  . Sinus  bradycardia    a. Baseline HR 50s-60s.  . Stroke [facility]) early 2000's   "small"; denies residual on 01/07/2015)    Past Surgical History:  Procedure Laterality Date  . BACK SURGERY    . BONE MARROW BIOPSY  11/2014  . CARDIAC CATHETERIZATION  12/27/2014   Procedure: INTRAVASCULAR PRESSURE WIRE/FFR STUDY;  Surgeon: Larey Dresser, MD;  Location: [facility] CATH LAB;  Service: Cardiovascular;;  . CARDIAC CATHETERIZATION N/A 11/25/2016   Procedure: Left Heart Cath and Coronary Angiography;  Surgeon: Sherren Mocha, MD;  Location: [facility] CV LAB;  Service: Cardiovascular;  Laterality: N/A;  . CARDIOVERSION N/A 02/13/2016   Procedure: CARDIOVERSION;  Surgeon: Josue Hector, MD;  Location: [facility] ENDOSCOPY;  Service: Cardiovascular;  Laterality: N/A;  . CARPAL TUNNEL RELEASE Right 1980's  . CATARACT EXTRACTION Bilateral   . CATARACT EXTRACTION W/ INTRAOCULAR LENS  IMPLANT, BILATERAL  2000's  . CORONARY ANGIOPLASTY WITH STENT PLACEMENT  01/07/2015   "2"  . CORONARY STENT PLACEMENT    . DILATION AND CURETTAGE OF UTERUS    . ESOPHAGOGASTRODUODENOSCOPY (EGD) WITH ESOPHAGEAL DILATION  "several times"  . JOINT REPLACEMENT    . KNEE ARTHROSCOPY Left 1995  . LAPAROSCOPIC CHOLECYSTECTOMY  2003  . LEFT HEART CATHETERIZATION WITH CORONARY ANGIOGRAM N/A 12/27/2014   Procedure: LEFT HEART CATHETERIZATION WITH CORONARY ANGIOGRAM;  Surgeon: Larey Dresser, MD;  Location: [facility] CATH LAB;  Service: Cardiovascular;  Laterality: N/A;  . PERCUTANEOUS CORONARY ROTOBLATOR INTERVENTION (PCI-R)  01/07/2015  . PERCUTANEOUS CORONARY ROTOBLATOR INTERVENTION (PCI-R) N/A 01/07/2015   Procedure: PERCUTANEOUS CORONARY ROTOBLATOR INTERVENTION (PCI-R);  Surgeon: Blane Ohara, MD;  Location: [facility] CATH LAB;  Service: Cardiovascular;  Laterality: N/A;  . POSTERIOR FUSION THORACIC SPINE  08/2015  . POSTERIOR LUMBAR FUSION  01/2015  . TOTAL KNEE ARTHROPLASTY Bilateral 1990's - 2000's     Prior to Admission medications   Medication Sig Start  Date End Date Taking? Authorizing Provider  aspirin EC 81 MG tablet Take 1 tablet (81 mg total) by mouth daily. 11/23/16  Yes Isaiah Serge, NP  Calcium Citrate-Vitamin D (CALCIUM + D PO) Take 1 tablet by mouth daily.   Yes [provider]  Cholecalciferol (VITAMIN D) 2000 units tablet Take 2,000 Units by mouth daily.   Yes [provider]  clobetasol cream (TEMOVATE) 8.45 % Apply 1 application topically See admin instructions. Applies to vaginal area twice a week. 09/23/16  Yes [provider]  diltiazem (CARDIZEM CD) 120 MG 24 hr capsule Take 1 capsule (120 mg total) by mouth 2 (two) times daily. 07/20/19  Yes Dettinger, Fransisca Kaufmann, MD  ELIQUIS 5 MG TABS tablet Take 1 tablet (5 mg total) by mouth 2 (two) times daily. 04/26/20  Yes Gottschalk, Leatrice Jewels M, DO  Eszopiclone 3 MG  TABS Take 1 tablet (3 mg total) by mouth at bedtime. For insomnia,take immediately before bedtime. 07/20/19  Yes Dettinger, Fransisca Kaufmann, MD  fentaNYL (DURAGESIC - DOSED MCG/HR) 50 MCG/HR Place 1 patch (50 mcg total) onto the skin every 3 (three) days. 10/31/14  Yes [name], MD  FLUoxetine (PROZAC) 40 MG capsule Take 1 capsule (40 mg total) by mouth daily. 07/20/19  Yes Dettinger, Fransisca Kaufmann, MD  furosemide (LASIX) 80 MG tablet Take 1 tablet (80 mg total) by mouth daily. 07/20/19 07/16/20 Yes Dettinger, Fransisca Kaufmann, MD  HYDROcodone-acetaminophen (NORCO) 10-325 MG per tablet Take 1 tablet by mouth every 4 (four) hours as needed for moderate pain.    Yes [provider]  Multiple Vitamin (MULTIVITAMIN) tablet Take 1 tablet by mouth daily. For supplement   Yes [provider]  nitroGLYCERIN (NITROSTAT) 0.4 MG SL tablet Place 1 tablet (0.4 mg total) under the tongue every 5 (five) minutes as needed for chest pain (up to 3 doses). 10/02/19  Yes Weaver, Nicki Reaper T, PA-C  NONFORMULARY OR COMPOUNDED ITEM Antifungal solution: Terbinafine 3%, Fluconazole 2%, Tea Tree Oil 5%, Urea 10%, Ibuprofen 2% in DMSO  suspension #45m Patient taking differently: Apply 1 application topically every other day. Antifungal solution: Terbinafine 3%, Fluconazole 2%, Tea Tree Oil 5%, Urea 10%, Ibuprofen 2% in DMSO suspension #3110m For toe fungus 06/17/20  Yes [place], JeStephani PoliceDPM  pantoprazole (PROTONIX) 40 MG tablet Take 1 tablet (40 mg total) by mouth 2 (two) times daily. 07/20/19  Yes Dettinger, JoFransisca KaufmannMD  potassium chloride SA (K-DUR) 20 MEQ tablet Take 1 tablet (20 mEq total) by mouth daily. 07/20/19  Yes Dettinger, JoFransisca KaufmannMD  rosuvastatin (CRESTOR) 10 MG tablet Take 1 tablet (10 mg total) by mouth daily. Patient taking differently: Take 5-10 mg by mouth daily. Alternating doses. 10/25/19  Yes Dettinger, JoFransisca KaufmannMD  triamcinolone cream (KENALOG) 0.1 % Apply 1 application topically 3 (three) times daily.  06/10/20  Yes [provider]  XIFAXAN 550 MG TABS tablet Take 550 mg by mouth 2 (two) times daily. X 10 days. 07/08/20  Yes [provider]  diphenoxylate-atropine (LOMOTIL) 2.5-0.025 MG per tablet Take 1 tablet by mouth 4 (four) times daily as needed for diarrhea or loose stools.  Patient not taking: Reported on 07/16/2020 08/17/14   [provider]  Ferrous Sulfate (IRON PO) Take 1 tablet by mouth See admin instructions. Take one tablet by mouth daily (OTC Supplement)  Patient not taking: Reported on 07/16/2020    [provider]  methocarbamol (ROBAXIN) 500 MG tablet Take 500 mg by mouth 4 (four) times daily as needed for muscle spasms. Patient not taking: Reported on 07/16/2020    [provider]    Inpatient Medications: Scheduled Meds: . Chlorhexidine Gluconate Cloth  6 each Topical Daily  . [START ON 07/18/2020] fentaNYL  1 patch Transdermal Q72H  . fluticasone  1 spray Each Nare Daily  . melatonin  3 mg Oral QHS  . pantoprazole (PROTONIX) IV  40 mg Intravenous Q12H  . sodium chloride  2 spray Each Nare BID   Continuous Infusions: . sodium chloride 75  mL/hr at 07/17/20 1200  . diltiazem (CARDIZEM) infusion 15 mg/hr (07/17/20 1200)   PRN Meds:   Allergies:    Allergies  Allergen Reactions  . Buprenorphine Hcl Itching  . Cymbalta [Duloxetine Hcl] Other (See Comments)    Other reaction(s): Other (See Comments) Personality changes - crying  2014  . Morphine And  Related Itching  . Oxycodone-Acetaminophen Other (See Comments)    Personality changes   . Valium [Diazepam] Other (See Comments)    Personality changes - "in another world" ;  Hallucinations  2015  . Statins Other (See Comments)    Other reaction(s): Other (See Comments) Muscle weakness Muscle weakness  . Altace [Ramipril] Other (See Comments)    unknown  . Floxin [Ofloxacin] Other (See Comments)    unknown  . Hydromorphone Other (See Comments)    Other reaction(s): Confusion (intolerance) unknown  . Lovaza [Omega-3-Acid Ethyl Esters] Other (See Comments)    Muscle weakness   . Trilipix [Choline Fenofibrate] Other (See Comments)    Muscle weakness   . Zetia [Ezetimibe] Other (See Comments)    Muscle weakness     Social History:   Social History   Socioeconomic History  . Marital status: Widowed    Spouse name: Not on file  . Number of children: 2  . Years of education: HS  . Highest education level: Not on file  Occupational History  . Occupation: Licensed conveyancer- retired    Comment: retired  Tobacco Use  . Smoking status: Never Smoker  . Smokeless tobacco: Never Used  Vaping Use  . Vaping Use: Never used  Substance and Sexual Activity  . Alcohol use: No    Comment: 01/07/2015 "glass of wine at Christmas, maybe"  . Drug use: No  . Sexual activity: Never  Other Topics Concern  . Not on file  Social History Narrative   Patient is right handed   Patient drinks 1-2 sodas daily.   patlient lives alone.   Social Determinants of Health   Financial Resource Strain:   . Difficulty of Paying Living Expenses:   Food Insecurity:   . Worried About Ship broker in the Last Year:   . Arboriculturist in the Last Year:   Transportation Needs:   . Film/video editor (Medical):   Marland Kitchen Lack of Transportation (Non-Medical):   Physical Activity: Inactive  . Days of Exercise per Week: 0 days  . Minutes of Exercise per Session: 0 min  Stress: Stress Concern Present  . Feeling of Stress : To some extent  Social Connections: Socially Isolated  . Frequency of Communication with Friends and Family: Three times a week  . Frequency of Social Gatherings with Friends and Family: Once a week  . Attends Religious Services: Never  . Active Member of Clubs or Organizations: No  . Attends Archivist Meetings: Never  . Marital Status: Widowed  Intimate Partner Violence:   . Fear of Current or Ex-Partner:   . Emotionally Abused:   Marland Kitchen Physically Abused:   . Sexually Abused:     Family History:   Family History  Problem Relation Age of Onset  . Coronary artery disease Father   . Peripheral vascular disease Father   . Coronary artery disease Mother   . Coronary artery disease Brother   . Colon cancer Sister   . Emphysema Sister   . Sleep apnea Son   . Colon cancer Sister        spread to her brain  . Emphysema Brother   . Dementia Neg Hx    Family Status:  Family Status  Relation Name Status  . Father  Deceased  . Mother  Deceased  . Brother [employer]  . Sister  Deceased  . Sister  Deceased  . Son Briscoe Burns  . Sister  Deceased  .  Daughter Karel Jarvis Alive  . Brother  Deceased  . Neg Hx  (Not Specified)    ROS:  Please see the history of present illness.  All other ROS reviewed and negative.     Physical Exam/Data:   Vitals:   07/17/20 1315 07/17/20 1330 07/17/20 1345 07/17/20 1400  BP:      Pulse: 63 86 73 91  Resp: 20 14 15 21   Temp:      TempSrc:      SpO2: 95% 95% 95% 92%  Weight:      Height:        Intake/Output Summary (Last 24 hours) at 07/17/2020 1507 Last data filed at 07/17/2020 1200 Gross per 24  hour  Intake 3238.97 ml  Output 250 ml  Net 2988.97 ml    Last 3 Weights 07/17/2020 07/16/2020 07/16/2020  Weight (lbs) 175 lb 14.8 oz 175 lb 14.8 oz 184 lb  Weight (kg) 79.8 kg 79.8 kg 83.462 kg     Body mass index is 27.55 kg/m.   General:  Well nourished, well developed, female in no acute distress HEENT: normal Lymph: no adenopathy Neck: JVD -not elevated Endocrine:  No thryomegaly Vascular: No carotid bruits; 4/4 extremity pulses 2+  Cardiac:  normal S1, S2; irregular rate and rhythm; no murmur Lungs: Coarse Rales and some rhonchi bilaterally, no wheezing Abd: soft, nontender, no hepatomegaly  Ext: no edema Musculoskeletal:  No deformities, BUE and BLE strength normal and equal Skin: warm and dry  Neuro:  CNs 2-12 intact, no focal abnormalities noted Psych:  Normal affect   EKG:  The EKG was personally reviewed and demonstrates: 7/27 ECG is atrial fibrillation, heart rate 84, no acute ischemic changes Telemetry:  Telemetry was personally reviewed and demonstrates: Atrial fibrillation, rate generally 90-120, PVCs   CV studies:   ECHO: 04/15/2020 1. Difficult acoustic windows. Atrial fibrillation makes accurate  assessment of LVEF difficult. LVEF is probab mildly decreased, does not  appear to be significantly differnt than previous echo images . Left  ventricular ejection fraction, by estimation,  is 45 to 50%. The left ventricle has mildly decreased function. The left  ventricle has no regional wall motion abnormalities. There is mild left  ventricular hypertrophy. Left ventricular diastolic parameters are  indeterminate.  2. Right ventricular systolic function is mildly reduced. The right  ventricular size is normal. There is mildly elevated pulmonary artery  systolic pressure.  3. Left atrial size was mildly dilated.  4. The mitral valve is normal in structure. Mild mitral valve  regurgitation.  5. Tricuspid valve regurgitation is mild to moderate.  6. The  aortic valve is normal in structure. Aortic valve regurgitation is  trivial.   MYOVIEW: 05/29/2020  Nuclear stress EF: 44%.  There was no ST segment deviation noted during stress.  The study is normal.  The left ventricular ejection fraction is moderately decreased (30-44%).  This is an intermediate risk study due to reduced systolic function. There is no ischemia.   CATH: 11/25/2016 Continued patency of the distal left mainstem/proximal LAD/proximal circumflex bifurcation stents with mild ISR of the proximal circumflex  Mild calcific, nonobstructive CAD  Recommend: continued medical therapy for CAD, CHF, and atrial fibrillation Left Main  Previously placed Ost LM to LM drug eluting stent is widely patent. There is a stent extending from the midportion of the left mainstem into the proximal LAD. There is no significant stenosis throughout the stented segment.  Left Anterior Descending  The vessel exhibits minimal luminal irregularities.  Left Circumflex  The vessel exhibits minimal luminal irregularities.  The lesion was previously treated using a drug eluting stent between 1-2 years ago. The left circumflex stent was previously 'crushed' as part of the distal left main intervention. The stent is widely patent with mild 25% in-stent restenosis at the origin of the circumflex.  Right Coronary Artery  The vessel exhibits minimal luminal irregularities.  The lesion is moderately calcified. nonobstructive calcified mid-RCA stenosis    Laboratory Data:   Chemistry Recent Labs  Lab 07/16/20 1050 07/17/20 0118  NA 139 139  K 3.7 3.6  CL 99 103  CO2 26 26  GLUCOSE 132* 114*  BUN 10 8  CREATININE 0.89 0.74  CALCIUM 9.6 8.7*  GFRNONAA >60 >60  GFRAA >60 >60  ANIONGAP 14 10    Lab Results  Component Value Date   ALT 18 04/01/2020   AST 25 04/01/2020   ALKPHOS 103 04/01/2020   BILITOT 0.4 04/01/2020   Hematology Recent Labs  Lab 07/16/20 1050 07/17/20 0118  WBC  10.6* 7.6  RBC 4.28 3.92  HGB 13.1 11.8*  HCT 42.0 37.6  MCV 98.1 95.9  MCH 30.6 30.1  MCHC 31.2 31.4  RDW 13.2 13.4  PLT 249 193   Cardiac Enzymes High Sensitivity Troponin:  No results for input(s): TROPONINIHS in the last 720 hours.    BNPNo results for input(s): BNP, PROBNP in the last 168 hours.  DDimer No results for input(s): DDIMER in the last 168 hours. TSH:  Lab Results  Component Value Date   TSH 3.030 07/20/2019   Lipids: Lab Results  Component Value Date   CHOL 138 07/20/2019   HDL 61 07/20/2019   LDLCALC 52 07/20/2019   TRIG 123 07/20/2019   CHOLHDL 2.3 07/20/2019   HgbA1c: Lab Results  Component Value Date   HGBA1C 6.1 (H) 07/20/2019   Magnesium:  Magnesium  Date Value Ref Range Status  07/17/2020 2.1 1.7 - 2.4 mg/dL Final    Comment:    Performed at [facility], [place] [address], [place], [place] [address]     Radiology/Studies:  [place] Neck Soft Tissue  Result Date: 07/16/2020 CLINICAL DATA:  Difficulty swallowing EXAM: NECK SOFT TISSUES - 1+ VIEW COMPARISON:  July 15, 2020 FINDINGS: The cervical spine is visualized from C1-C7. Partial visualization of upper thoracic spine posterior fixation hardware.Cervical alignment is maintained. Vertebral body heights are maintained: no evidence of acute fracture. Multilevel intervertebral disc space height loss and osteophyte proliferation. Bilateral facet arthropathy. Osteopenia. LEFT carotid atherosclerotic calcifications. No prevertebral soft tissue swelling. Visualized thorax is unremarkable. IMPRESSION: 1. No prevertebral soft tissue edema. Consider dedicated speech swallow evaluation given indication of choking and difficulty swallowing. 2. No evidence of acute osseous abnormality. 3. LEFT carotid atherosclerotic calcifications. Electronically Signed   By: Valentino Saxon MD   On: 07/16/2020 11:27   DG Neck Soft Tissue  Result Date: 07/15/2020 CLINICAL DATA:  Choked on a pill.  Sore throat EXAM: NECK  SOFT TISSUES - 1+ VIEW COMPARISON:  None. FINDINGS: Densities over the throat are attributed to cartilage calcification. No ovoid density typical of a retained pill. No detected airway swelling or narrowing. Cervical spine degeneration in keeping with age. Upper thoracic spine fusion. IMPRESSION: No visible foreign body or upper airway swelling. Electronically Signed   By: Monte Fantasia M.D.   On: 07/15/2020 04:49   DG Chest 2 View  Result Date: 07/16/2020 CLINICAL DATA:  Shortness of breath EXAM: CHEST - 2 VIEW  COMPARISON:  07/15/2020 FINDINGS: Stable mild cardiomegaly. Known large hiatal hernia was better seen on prior imaging. Unchanged eventration of the anterior aspect of the right hemidiaphragm. No focal airspace consolidation, pleural effusion, or pneumothorax. Extensive thoracolumbar fusion hardware. Degenerative changes of the shoulders, right worse than left. IMPRESSION: No active cardiopulmonary disease.  No interval change prior. Electronically Signed   By: Davina Poke D.O.   On: 07/16/2020 11:05   DG Chest 2 View  Result Date: 07/15/2020 CLINICAL DATA:  Choked on a potassium pill with epigastric pain and throat burning EXAM: CHEST - 2 VIEW COMPARISON:  04/03/2020 FINDINGS: Extensive thoracic and lumbar spine fusion. No cardiomegaly when accounting for a large hiatal hernia. Chronic interstitial reticulation. There is no edema, consolidation, effusion, or pneumothorax. Eventration of the right diaphragm. IMPRESSION: Stable compared to prior.  No evidence of acute disease. Electronically Signed   By: Monte Fantasia M.D.   On: 07/15/2020 04:50   CT Soft Tissue Neck W Contrast  Result Date: 07/16/2020 CLINICAL DATA:  79 year old female with recent coughing, choking, noisy respirations. Globus sensation, feels pill stuck in throat. EXAM: CT NECK WITH CONTRAST TECHNIQUE: Multidetector CT imaging of the neck was performed using the standard protocol following the bolus administration of  intravenous contrast. CONTRAST:  57m OMNIPAQUE IOHEXOL 300 MG/ML  SOLN COMPARISON:  Chest CT today reported separately. Neck soft tissue radiographs earlier today. FINDINGS: Pharynx and larynx: Larynx and epiglottis are within normal limits. Pharyngeal soft tissue contours are within normal limits. Negative parapharyngeal spaces. Negative retropharyngeal space. A tiny postinflammatory calcification of the left palatine tonsil is suspected on series 3, image 53 and coronal image 53. No definite radiopaque foreign body identified. Salivary glands: Negative sublingual space. Submandibular and parotid glands are within normal limits. Thyroid: Negative. Lymph nodes: Negative.  No cervical lymphadenopathy. Vascular: Major vascular structures in the neck and at the skull base are patent. Tortuous carotid arteries with bilateral calcified atherosclerosis. Limited intracranial: Negative. Visualized orbits: Negative; postoperative changes to both globes. Mastoids and visualized paranasal sinuses: Paranasal sinuses, tympanic cavities and mastoids are clear. Skeleton: Upper thoracic posterior spinal fusion hardware is partially visible. No acute osseous abnormality identified. Upper chest: Reported separately. IMPRESSION: 1. A punctate density at the left palatine tonsil is favored to be a small postinflammatory calcification. Note that an impacted fishbone can have a similar appearance. But there is otherwise no radiopaque foreign body identified. 2. Otherwise negative CT appearance of the neck; tortuous carotid arteries with calcified atherosclerosis. 3.  CT Chest today reported separately. Electronically Signed   By: HGenevie AnnM.D.   On: 07/16/2020 22:52   CT Chest W Contrast  Result Date: 07/16/2020 CLINICAL DATA:  Swallowed foreign body. Patient feels pill stuck in throat. EXAM: CT CHEST WITH CONTRAST TECHNIQUE: Multidetector CT imaging of the chest was performed during intravenous contrast administration. Performed in  conjunction with CT of the neck. CONTRAST:  755mOMNIPAQUE IOHEXOL 300 MG/ML  SOLN COMPARISON:  Chest radiograph earlier today.  Chest CT 11/25/2016 FINDINGS: Cardiovascular: Tortuous thoracic aorta with atherosclerosis. No acute aortic abnormality or aneurysm. Mild cardiomegaly. There are coronary artery calcifications. No pericardial effusion. Mediastinum/Nodes: Patulous esophagus with small volume of retained fluid, decreased retained fluid from 2017. There is a large hiatal hernia with greater than 50% of the stomach intrathoracic. No visualized radiopaque foreign body, extensive streak artifact from thoracic spinal fusion hardware partially obscures evaluation. No pneumomediastinum or evidence of perforation. No enlarged mediastinal or hilar lymph nodes. Right thyroid calcification without  well-defined nodule. Lungs/Pleura: No evidence of tracheal or bronchial foreign body. There is minimal retained mucus within the distal trachea. Heterogeneous pulmonary parenchyma. Mild lower lobe bronchial thickening. Compressive atelectasis in the left lower lobe related to hiatal hernia. No pleural fluid. No findings of pulmonary edema. No pneumothorax. Upper Abdomen: Post cholecystectomy with biliary dilatation, similar to prior exam. No acute or unexpected findings. Musculoskeletal: Extensive posterior thoracic fusion with rod and intrapedicular screws. Remote T12 fracture. Focal kyphosis at T2-T3 above the level effusion is unchanged from prior exam. IMPRESSION: 1. No evidence of retained foreign body or pill. 2. Large hiatal hernia with greater than 50% of the stomach intrathoracic, unchanged from prior exam. Chronically patulous esophagus with small volume of retained fluid, decreased retained fluid from 2017. 3. Minimal mucus in the trachea.  Mild central bronchial thickening. 4. Heterogeneous pulmonary parenchyma, can be seen with small vessel or small airways disease. 5. Aortic atherosclerosis. Mild cardiomegaly  with coronary artery calcifications. Aortic Atherosclerosis (ICD10-I70.0). Electronically Signed   By: Keith Rake M.D.   On: 07/16/2020 22:55    Assessment and Plan:   1. Atrial fib, RVR:  -Her heart rate increased in the setting of acute illness and being off her medications. -Prior to admission, she was taking Cardizem CD 120 mg twice daily for rate control and Eliquis 5 mg twice daily for anticoagulation. -Currently, she is on IV Cardizem at 15 mg an hour -Because she is n.p.o., she has not had Eliquis.  She is not on heparin or Lovenox. -No bleeding issues, discuss with MD if she should be started on anticoagulation with Lovenox  2.  Upper airway irritation after choking on a pill -X-ray showed large hiatal hernia with greater than 50% of the stomach intrathoracic, unchanged. -She was seen by Otolaryngologist who did a fiberoptic laryngoscopy that showed secretions in the hypopharynx but no foreign body or any airway obstruction. -He felt her history was strongly suggestive of an aspiration event and subsequent pneumonitis.  He felt they might need to consider a formal bronchoscopy -Per IM/Pulmonary (although Pulmonary signed off this morning)  Active Problems:   Acute respiratory distress     For questions or updates, please contact [place] Please consult [email or web address] for contact info under Cardiology/STEMI.   Jonetta Speak, PA-C  07/17/2020 3:07 PM

## 2020-07-17 NOTE — Progress Notes (Signed)
PCCM Progress Note  Name: [name] [name] MRN: [id number] DOB: [birth year] Admit date: 07/16/2020 CC: Respiratory distress  Brief History: 44 female choked on potassium pill and then developed respiratory distress, cough, and sputum production.  Also had A fib with RVR.  Subjective: Wants some water.  Has sinus congestion.  Reflux better.  Still has wet cough and raspy voice.  Vital signs: BP 120/65 (BP Location: Right Arm)   Pulse 72   Temp 97.9 F (36.6 C)   Resp 14   Ht 5\' 7"  (1.702 m)   Wt 79.8 kg   SpO2 95%   BMI 27.55 kg/m   Physical exam:  General - alert Eyes - pupils reactive ENT - no sinus tenderness, raspy voice, no stridor Cardiac - irregular Chest - equal breath sounds b/l, no wheezing or rales Abdomen - soft, non tender, + bowel sounds Extremities - no cyanosis, clubbing, or edema Skin - no rashes Neuro - normal strength, moves extremities, follows commands Psych - normal mood and behavior  Labs: CMP Latest Ref Rng & Units 07/17/2020 07/16/2020 04/01/2020  Glucose 70 - 99 mg/dL 114(H) 132(H) 109(H)  BUN 8 - 23 mg/dL 8 10 12   Creatinine 0.44 - 1.00 mg/dL 0.74 0.89 0.87  Sodium 135 - 145 mmol/L 139 139 139  Potassium 3.5 - 5.1 mmol/L 3.6 3.7 3.9  Chloride 98 - 111 mmol/L 103 99 99  CO2 22 - 32 mmol/L 26 26 25   Calcium 8.9 - 10.3 mg/dL 8.7(L) 9.6 9.2  Total Protein 6.0 - 8.5 g/dL - - 6.1  Total Bilirubin 0.0 - 1.2 mg/dL - - 0.4  Alkaline Phos 39 - 117 IU/L - - 103  AST 0 - 40 IU/L - - 25  ALT 0 - 32 IU/L - - 18    CBC Latest Ref Rng & Units 07/17/2020 07/16/2020 04/01/2020  WBC 4.0 - 10.5 K/uL 7.6 10.6(H) 6.2  Hemoglobin 12.0 - 15.0 g/dL 11.8(L) 13.1 12.9  Hematocrit 36 - 46 % 37.6 42.0 38.8  Platelets 150 - 400 K/uL 193 249 228    Imaging studies: DG Neck Soft Tissue  Result Date: 07/16/2020 CLINICAL DATA:  Difficulty swallowing EXAM: NECK SOFT TISSUES - 1+ VIEW COMPARISON:  July 15, 2020 FINDINGS: The cervical spine is visualized from C1-C7. Partial  visualization of upper thoracic spine posterior fixation hardware.Cervical alignment is maintained. Vertebral body heights are maintained: no evidence of acute fracture. Multilevel intervertebral disc space height loss and osteophyte proliferation. Bilateral facet arthropathy. Osteopenia. LEFT carotid atherosclerotic calcifications. No prevertebral soft tissue swelling. Visualized thorax is unremarkable. IMPRESSION: 1. No prevertebral soft tissue edema. Consider dedicated speech swallow evaluation given indication of choking and difficulty swallowing. 2. No evidence of acute osseous abnormality. 3. LEFT carotid atherosclerotic calcifications. Electronically Signed   By: Valentino Saxon MD   On: 07/16/2020 11:27   DG Chest 2 View  Result Date: 07/16/2020 CLINICAL DATA:  Shortness of breath EXAM: CHEST - 2 VIEW COMPARISON:  07/15/2020 FINDINGS: Stable mild cardiomegaly. Known large hiatal hernia was better seen on prior imaging. Unchanged eventration of the anterior aspect of the right hemidiaphragm. No focal airspace consolidation, pleural effusion, or pneumothorax. Extensive thoracolumbar fusion hardware. Degenerative changes of the shoulders, right worse than left. IMPRESSION: No active cardiopulmonary disease.  No interval change prior. Electronically Signed   By: Davina Poke D.O.   On: 07/16/2020 11:05   CT Soft Tissue Neck W Contrast  Result Date: 07/16/2020 CLINICAL DATA:  79 year old female with recent coughing, choking,  noisy respirations. Globus sensation, feels pill stuck in throat. EXAM: CT NECK WITH CONTRAST TECHNIQUE: Multidetector CT imaging of the neck was performed using the standard protocol following the bolus administration of intravenous contrast. CONTRAST:  39mL OMNIPAQUE IOHEXOL 300 MG/ML  SOLN COMPARISON:  Chest CT today reported separately. Neck soft tissue radiographs earlier today. FINDINGS: Pharynx and larynx: Larynx and epiglottis are within normal limits. Pharyngeal soft  tissue contours are within normal limits. Negative parapharyngeal spaces. Negative retropharyngeal space. A tiny postinflammatory calcification of the left palatine tonsil is suspected on series 3, image 53 and coronal image 53. No definite radiopaque foreign body identified. Salivary glands: Negative sublingual space. Submandibular and parotid glands are within normal limits. Thyroid: Negative. Lymph nodes: Negative.  No cervical lymphadenopathy. Vascular: Major vascular structures in the neck and at the skull base are patent. Tortuous carotid arteries with bilateral calcified atherosclerosis. Limited intracranial: Negative. Visualized orbits: Negative; postoperative changes to both globes. Mastoids and visualized paranasal sinuses: Paranasal sinuses, tympanic cavities and mastoids are clear. Skeleton: Upper thoracic posterior spinal fusion hardware is partially visible. No acute osseous abnormality identified. Upper chest: Reported separately. IMPRESSION: 1. A punctate density at the left palatine tonsil is favored to be a small postinflammatory calcification. Note that an impacted fishbone can have a similar appearance. But there is otherwise no radiopaque foreign body identified. 2. Otherwise negative CT appearance of the neck; tortuous carotid arteries with calcified atherosclerosis. 3.  CT Chest today reported separately. Electronically Signed   By: Genevie Ann M.D.   On: 07/16/2020 22:52   CT Chest W Contrast  Result Date: 07/16/2020 CLINICAL DATA:  Swallowed foreign body. Patient feels pill stuck in throat. EXAM: CT CHEST WITH CONTRAST TECHNIQUE: Multidetector CT imaging of the chest was performed during intravenous contrast administration. Performed in conjunction with CT of the neck. CONTRAST:  57mL OMNIPAQUE IOHEXOL 300 MG/ML  SOLN COMPARISON:  Chest radiograph earlier today.  Chest CT 11/25/2016 FINDINGS: Cardiovascular: Tortuous thoracic aorta with atherosclerosis. No acute aortic abnormality or  aneurysm. Mild cardiomegaly. There are coronary artery calcifications. No pericardial effusion. Mediastinum/Nodes: Patulous esophagus with small volume of retained fluid, decreased retained fluid from 2017. There is a large hiatal hernia with greater than 50% of the stomach intrathoracic. No visualized radiopaque foreign body, extensive streak artifact from thoracic spinal fusion hardware partially obscures evaluation. No pneumomediastinum or evidence of perforation. No enlarged mediastinal or hilar lymph nodes. Right thyroid calcification without well-defined nodule. Lungs/Pleura: No evidence of tracheal or bronchial foreign body. There is minimal retained mucus within the distal trachea. Heterogeneous pulmonary parenchyma. Mild lower lobe bronchial thickening. Compressive atelectasis in the left lower lobe related to hiatal hernia. No pleural fluid. No findings of pulmonary edema. No pneumothorax. Upper Abdomen: Post cholecystectomy with biliary dilatation, similar to prior exam. No acute or unexpected findings. Musculoskeletal: Extensive posterior thoracic fusion with rod and intrapedicular screws. Remote T12 fracture. Focal kyphosis at T2-T3 above the level effusion is unchanged from prior exam. IMPRESSION: 1. No evidence of retained foreign body or pill. 2. Large hiatal hernia with greater than 50% of the stomach intrathoracic, unchanged from prior exam. Chronically patulous esophagus with small volume of retained fluid, decreased retained fluid from 2017. 3. Minimal mucus in the trachea.  Mild central bronchial thickening. 4. Heterogeneous pulmonary parenchyma, can be seen with small vessel or small airways disease. 5. Aortic atherosclerosis. Mild cardiomegaly with coronary artery calcifications. Aortic Atherosclerosis (ICD10-I70.0). Electronically Signed   By: Keith Rake M.D.   On: 07/16/2020 22:55  Assessment/plan:  Respiratory distress after choking on pill. - improved - speech to assess  swallowing  Upper airway cough syndrome with post nasal drip. - add flonase, nasal irrigation  GERD with large hiatal hernia. - continue protonix  Punctate density Lt palatine tonsil. - ENT consulted by hospitalist team  PCCM will sign off.  Please call if additional help needed while she is in hospital.  Chesley Mires, MD [place] Pager - [phone] 07/17/2020, 8:56 AM

## 2020-07-17 NOTE — Consult Note (Signed)
Reason for Consult: Globus and dysphagia Referring Physician: ICU team  [name] [name] is an 79 y.o. female with a 3-day history of dysphagia and globus.  It began after attempts to swallow a potassium pill.  She had an acute episode of coughing and a "choking fit" that lasted for quite some time and was very anxiety provoking.  She never had trouble with cyanosis and did not pass out.  Since that time she has developed a significant cough.  She was admitted to the intensive care unit because of a cardiac arrhythmia.  I was consulted today to evaluate her upper airway and provide recommendations regarding her dysphagia and choking episode.  There was also a question of a calcification in the left tonsil seen on recent CT scan of the neck.  The patient has not eaten any fish in the past couple of weeks and complains of no discomfort at the tonsillar level.   Past Medical History:  Diagnosis Date  . Abnormal nuclear cardiac imaging test   . Anemia   . Anemia, iron deficiency 07/02/2015  . Anxiety   . Arthritis    "knees, back, fingers, toes; joints" (01/07/2015)  . 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 CAD (coronary artery disease)    a. Abnl nuc 11/2014. Cath 12/2014 - turned down for CABG. Ultimately s/p TTVP, rotational atherectomy, PTCA and stenting of the ostial LCx and left main into the LAD (crush technique), and IVUS of the LAD/Left main. // b. Myoview 11/17: EF 48, poor quality/significant artifact; inf-lateral, inferior ischemia; Intermediate Risk  . Chronic atrial fibrillation ([place])    a.  First noted post-op 9/15 spinal fusion. She had cardioversion, not on anticoagulation. Fall risk, unsteady. // failed DCCV // Holter 10/17: AFib, Avg HR 97, PVCs, no other arrhythmia  . Chronic back pain greater than 3 months duration    a. spinal stenosis. Spinal fusion with rods in 2/15 at [facility] spinal fusion 9/15.  Marland Kitchen Chronic diastolic CHF    Echo 4/08:  EF 50-55%, trivial AI, midl MR, mod  LAE, PASP 37 mmHg // Echocardiogram 03/2020: EF 45-50, no RWMA, mild LVH, mild reduced RVSF, mildly elevated PASP (RVSP 38.3), mild LAE, mild MR, mild to mod TR, trivial AI   . Circadian rhythm sleep disorder   . CTS (carpal tunnel syndrome)   . Deficiency anemia 11/10/2014  . Depression   . Diarrhea   . Diverticulitis of colon   . Esophageal stricture   . Gastritis   . Gastroesophageal hernia 07/06/2013  . GERD (gastroesophageal reflux disease)   . Hiatal hernia   . History of blood transfusion    "most of them related to OR's"   . HTN (hypertension)   . Hyperlipidemia   . IBS (irritable bowel syndrome)   . Incontinence 10/13/2012  . Insomnia   . Ischemic chest pain ([place])   . Memory disorder 12/04/2014  . Metabolic syndrome 12/24/4816  . MGUS (monoclonal gammopathy of unknown significance) dx'd 11/2014   a. Neg BMB 11/2014.  . Multiple falls   . Obesity   . Obstructive sleep apnea    "have mask; don't wear it" (01/07/2015)  . Orthostasis   . PAT (paroxysmal atrial tachycardia) ([place])   . Personal history of colonic polyps 10/25/2011 & 12/02/11   not retrieved Dr Lyla Son & tubular adenomas  . Pneumonia 03/2014  . Sinus bradycardia    a. Baseline HR 50s-60s.  . Stroke [facility]) early 2000's   "small"; denies residual on 01/07/2015)  Past Surgical History:  Procedure Laterality Date  . BACK SURGERY    . BONE MARROW BIOPSY  11/2014  . CARDIAC CATHETERIZATION  12/27/2014   Procedure: INTRAVASCULAR PRESSURE WIRE/FFR STUDY;  Surgeon: Larey Dresser, MD;  Location: [facility] CATH LAB;  Service: Cardiovascular;;  . CARDIAC CATHETERIZATION N/A 11/25/2016   Procedure: Left Heart Cath and Coronary Angiography;  Surgeon: Sherren Mocha, MD;  Location: [place] CV LAB;  Service: Cardiovascular;  Laterality: N/A;  . CARDIOVERSION N/A 02/13/2016   Procedure: CARDIOVERSION;  Surgeon: Josue Hector, MD;  Location: [facility] ENDOSCOPY;  Service: Cardiovascular;  Laterality: N/A;  . CARPAL TUNNEL RELEASE  Right 1980's  . CATARACT EXTRACTION Bilateral   . CATARACT EXTRACTION W/ INTRAOCULAR LENS  IMPLANT, BILATERAL  2000's  . CORONARY ANGIOPLASTY WITH STENT PLACEMENT  01/07/2015   "2"  . CORONARY STENT PLACEMENT    . DILATION AND CURETTAGE OF UTERUS    . ESOPHAGOGASTRODUODENOSCOPY (EGD) WITH ESOPHAGEAL DILATION  "several times"  . JOINT REPLACEMENT    . KNEE ARTHROSCOPY Left 1995  . LAPAROSCOPIC CHOLECYSTECTOMY  2003  . LEFT HEART CATHETERIZATION WITH CORONARY ANGIOGRAM N/A 12/27/2014   Procedure: LEFT HEART CATHETERIZATION WITH CORONARY ANGIOGRAM;  Surgeon: Larey Dresser, MD;  Location: [facility] C[facility] LAB;  Service: Cardiovascular;  Laterality: N/A;  . PERCUTANEOUS CORONARY ROTOBLATOR INTERVENTION (PCI-R)  01/07/2015  . PERCUTANEOUS CORONARY ROTOBLATOR INTERVENTION (PCI-R) N/A 01/07/2015   Procedure: PERCUTANEOUS CORONARY ROTOBLATOR INTERVENTION (PCI-R);  Surgeon: Blane Ohara, MD;  Location: [facility] CATH LAB;  Service: Cardiovascular;  Laterality: N/A;  . POSTERIOR FUSION THORACIC SPINE  08/2015  . POSTERIOR LUMBAR FUSION  01/2015  . TOTAL KNEE ARTHROPLASTY Bilateral 1990's - 2000's    Family History  Problem Relation Age of Onset  . Coronary artery disease Father   . Peripheral vascular disease Father   . Coronary artery disease Mother   . Coronary artery disease Brother   . Colon cancer Sister   . Emphysema Sister   . Sleep apnea Son   . Colon cancer Sister        spread to her brain  . Emphysema Brother   . Dementia Neg Hx     Social History:  reports that she has never smoked. She has never used smokeless tobacco. She reports that she does not drink alcohol and does not use drugs.  Allergies:  Allergies  Allergen Reactions  . Buprenorphine Hcl Itching  . Cymbalta [Duloxetine Hcl] Other (See Comments)    Other reaction(s): Other (See Comments) Personality changes - crying  2014  . Morphine And Related Itching  . Oxycodone-Acetaminophen Other (See Comments)    Personality changes    . Valium [Diazepam] Other (See Comments)    Personality changes - "in another world" ;  Hallucinations  2015  . Statins Other (See Comments)    Other reaction(s): Other (See Comments) Muscle weakness Muscle weakness  . Altace [Ramipril] Other (See Comments)    unknown  . Floxin [Ofloxacin] Other (See Comments)    unknown  . Hydromorphone Other (See Comments)    Other reaction(s): Confusion (intolerance) unknown  . Lovaza [Omega-3-Acid Ethyl Esters] Other (See Comments)    Muscle weakness   . Trilipix [Choline Fenofibrate] Other (See Comments)    Muscle weakness   . Zetia [Ezetimibe] Other (See Comments)    Muscle weakness     Medications: I have reviewed the patient's current medications.  Results for orders placed or performed during the hospital encounter of 07/16/20 (from the past  48 hour(s))  Basic metabolic panel     Status: Abnormal   Collection Time: 07/16/20 10:50 AM  Result Value Ref Range   Sodium 139 135 - 145 mmol/L   Potassium 3.7 3.5 - 5.1 mmol/L   Chloride 99 98 - 111 mmol/L   CO2 26 22 - 32 mmol/L   Glucose, Bld 132 (H) 70 - 99 mg/dL    Comment: Glucose reference range applies only to samples taken after fasting for at least 8 hours.   BUN 10 8 - 23 mg/dL   Creatinine, Ser 0.89 0.44 - 1.00 mg/dL   Calcium 9.6 8.9 - 10.3 mg/dL   GFR calc non Af Amer >60 >60 mL/min   GFR calc Af Amer >60 >60 mL/min   Anion gap 14 5 - 15    Comment: Performed at [facility] [address], [place], [place] [address]  CBC     Status: Abnormal   Collection Time: 07/16/20 10:50 AM  Result Value Ref Range   WBC 10.6 (H) 4.0 - 10.5 K/uL   RBC 4.28 3.87 - 5.11 MIL/uL   Hemoglobin 13.1 12.0 - 15.0 g/dL   HCT 42.0 36 - 46 %   MCV 98.1 80.0 - 100.0 fL   MCH 30.6 26.0 - 34.0 pg   MCHC 31.2 30.0 - 36.0 g/dL   RDW 13.2 11.5 - 15.5 %   Platelets 249 150 - 400 K/uL   nRBC 0.0 0.0 - 0.2 %    Comment: Performed at [facility], [place] [address] [place], [place]  [address]  SARS Coronavirus 2 by RT PCR (hospital order, performed in [facility] hospital lab) Nasopharyngeal Nasopharyngeal Swab     Status: None   Collection Time: 07/16/20 12:01 PM   Specimen: Nasopharyngeal Swab  Result Value Ref Range   SARS Coronavirus 2 NEGATIVE NEGATIVE    Comment: (NOTE) SARS-CoV-2 target nucleic acids are NOT DETECTED.  The SARS-CoV-2 RNA is generally detectable in upper and lower respiratory specimens during the acute phase of infection. The lowest concentration of SARS-CoV-2 viral copies this assay can detect is 250 copies / mL. A negative result does not preclude SARS-CoV-2 infection and should not be used as the sole basis for treatment or other patient management decisions.  A negative result may occur with improper specimen collection / handling, submission of specimen other than nasopharyngeal swab, presence of viral mutation(s) within the areas targeted by this assay, and inadequate number of viral copies (<250 copies / mL). A negative result must be combined with clinical observations, patient history, and epidemiological information.  Fact Sheet for Patients:   StrictlyIdeas.no  Fact Sheet for Healthcare Providers: BankingDealers.co.za  This test is not yet approved or  cleared by the [place] FDA and has been authorized for detection and/or diagnosis of SARS-CoV-2 by FDA under an Emergency Use Authorization (EUA).  This EUA will remain in effect (meaning this test can be used) for the duration of the COVID-19 declaration under Section 564(b)(1) of the Act, 21 U.S.C. section 360bbb-3(b)(1), unless the authorization is terminated or revoked sooner.  Performed at [facility], [place] [address], [place], [place] [address]   Glucose, capillary     Status: Abnormal   Collection Time: 07/16/20  8:13 PM  Result Value Ref Range   Glucose-Capillary 112 (H) 70 - 99 mg/dL    Comment: Glucose  reference range applies only to samples taken after fasting for at least 8 hours.  Comment 1 Notify RN   MRSA PCR Screening     Status: None   Collection Time: 07/16/20  9:00 PM   Specimen: Nasal Mucosa; Nasopharyngeal  Result Value Ref Range   MRSA by PCR NEGATIVE NEGATIVE    Comment:        The GeneXpert MRSA Assay (FDA approved for NASAL specimens only), is one component of a comprehensive MRSA colonization surveillance program. It is not intended to diagnose MRSA infection nor to guide or monitor treatment for MRSA infections. Performed at [facility], [place] [address] [place], [place] [address]   Glucose, capillary     Status: Abnormal   Collection Time: 07/17/20 12:37 AM  Result Value Ref Range   Glucose-Capillary 103 (H) 70 - 99 mg/dL    Comment: Glucose reference range applies only to samples taken after fasting for at least 8 hours.  Basic metabolic panel     Status: Abnormal   Collection Time: 07/17/20  1:18 AM  Result Value Ref Range   Sodium 139 135 - 145 mmol/L   Potassium 3.6 3.5 - 5.1 mmol/L   Chloride 103 98 - 111 mmol/L   CO2 26 22 - 32 mmol/L   Glucose, Bld 114 (H) 70 - 99 mg/dL    Comment: Glucose reference range applies only to samples taken after fasting for at least 8 hours.   BUN 8 8 - 23 mg/dL   Creatinine, Ser 0.74 0.44 - 1.00 mg/dL   Calcium 8.7 (L) 8.9 - 10.3 mg/dL   GFR calc non Af Amer >60 >60 mL/min   GFR calc Af Amer >60 >60 mL/min   Anion gap 10 5 - 15    Comment: Performed at [facility] [address] [place], [place] [address]  CBC     Status: Abnormal   Collection Time: 07/17/20  1:18 AM  Result Value Ref Range   WBC 7.6 4.0 - 10.5 K/uL   RBC 3.92 3.87 - 5.11 MIL/uL   Hemoglobin 11.8 (L) 12.0 - 15.0 g/dL   HCT 37.6 36 - 46 %   MCV 95.9 80.0 - 100.0 fL   MCH 30.1 26.0 - 34.0 pg   MCHC 31.4 30.0 - 36.0 g/dL   RDW 13.4 11.5 - 15.5 %   Platelets 193 150 - 400 K/uL   nRBC 0.0 0.0 - 0.2 %    Comment: Performed at [facility], [place] [address], [place], [place] [address]  Magnesium     Status: None   Collection Time: 07/17/20  1:18 AM  Result Value Ref Range   Magnesium 2.1 1.7 - 2.4 mg/dL    Comment: Performed at [facility] [address] [place], [place] [address]  Glucose, capillary     Status: None   Collection Time: 07/17/20  4:21 AM  Result Value Ref Range   Glucose-Capillary 90 70 - 99 mg/dL    Comment: Glucose reference range applies only to samples taken after fasting for at least 8 hours.  Glucose, capillary     Status: None   Collection Time: 07/17/20  8:02 AM  Result Value Ref Range   Glucose-Capillary 99 70 - 99 mg/dL    Comment: Glucose reference range applies only to samples taken after fasting for at least 8 hours.   Comment 1 Notify RN   Glucose, capillary     Status: None   Collection Time: 07/17/20  1:07 PM  Result Value Ref Range  Glucose-Capillary 98 70 - 99 mg/dL    Comment: Glucose reference range applies only to samples taken after fasting for at least 8 hours.   Comment 1 Notify RN      Review of Systems  Constitutional: Negative for chills and diaphoresis.  HENT: Positive for trouble swallowing. Negative for voice change.   Respiratory: Positive for cough, choking and shortness of breath. Negative for stridor.   Cardiovascular: Negative for chest pain.  Skin: Negative.   Psychiatric/Behavioral: The patient is nervous/anxious.    Blood pressure (!) 125/86, pulse 81, temperature 98.2 F (36.8 C), resp. rate 14, height 5' 7" (1.702 m), weight 79.8 kg, SpO2 94 %. Physical Exam  Patient is alert and oriented x3 in no acute distress Pupils equal round and reactive to light/extraocular movements intact/gaze conjugate Face is nonsyndromic thought evidence of trauma or bony step-offs. Skin exam of the head neck reveals no suspicious lesions, petechiae, or rashes. Pinna are normal bilaterally with nontender mastoids Nasal septum is slightly deviated with  normal-appearing mucosa Lips are normal and tongue mobility is without restriction.  I palpated both tonsils.  There was no palpable mass or asymmetry noted.  Particular attention was paid to the left tonsil where notation was made of a calcification on CT scan.  I could not see any evidence of inflammation, asymmetry, mass, or foreign body. No cervical adenopathy.  Trachea midline.  No inspiratory or expiratory stridor on auscultation. Chest symmetric expansions bilaterally without use of accessory muscles Cranial nerves II through XII intact.  No focal motor or sensory deficits noted  Procedure: Flexible fiberoptic laryngoscopy was performed at the bedside.  Both sides of the nose were examined and were unremarkable.  The nasopharynx was symmetric without mass.  The base of tongue was normal.  There was some pooling of secretions in the hypopharynx.  There was no evidence of a foreign body in the base of tongue, tonsils, or larynx.  Both true vocal cords move normally and there was no evidence of a tumor or any other impending airway obstruction.  CT scan: There is no evidence of a foreign body in the larynx and hypopharynx.  This is a location where the patient describes a globus sensation.  I carefully reviewed the radiology comments regarding a calcification in the tonsil.  This is not extension of a calcified styloid process nor does there appear to be a mass of any sort within the tonsil.  The patient's history is not suggestive of a recently ingested fishbone in the absence of any sort of inflammatory change makes this unlikely.  There is no evidence of asymmetry to suggest an intratonsillar mass.   Assessment/Plan:  Dysphagia Tonsillar calcification Possible foreign body aspiration GERD  I will see any evidence that the comments made about the tonsillar calcification represent anything significant either related to her dysphagia or another significant process.  That being said, I would  recommend a follow-up exam and possible scan in 4 to 6 weeks.  This should be done sooner should she develop any asymmetry or pain in her tonsil region.  Her history is strongly suggestive of an aspiration event.  I wonder if she aspirated her potassium tablet and this is created a pneumonitis manifesting in her deep-seated cough.  I will obviously defer to the pulmonary team on this but might consider a formal bronchoscopy to look for an inflammatory foreign body/potassium pill.  Thank you for this consultation.  Please call me directly if I can answer questions or  be of help in the future.  My cell number is [phone].   Marcina Millard 07/17/2020, 1:53 PM

## 2020-07-17 NOTE — Progress Notes (Signed)
PROGRESS NOTE    [name] [name]  DQQ:[id number] DOB: February 08, 1941 DOA: 07/16/2020 PCP: [name], DO   Brief Narrative: 79 year old with known past medical history of hiatal hernia, esophageal and oral pharyngeal dysphagia.  On 7/26 she had an episode where she choked on potassium pills.  She developed progressive increased work of breathing, copious upper airway secretion and worsening turbulent upper airway noises, initial soft tissue neck imaging without evidence of airway obstruction.  Critical care admitted patient due to worsening shortness of breath and new A. fib with RVR.  7/26 choking event on potassium pill, seen, imaging all without evidence of upper airway obstruction or aspiration discharge to home. 7/27 patient present back to the ED continue to complain of sore throat unable to breath and cough.  Copious oral secretions developed rapid ventricular response on telemetry   Assessment & Plan:   Active Problems:   Acute respiratory distress  1-Dysphagia; cough;  Remained stable overnight CT soft tissue neck and chest showed tonsil calcification, fishbone cannot be ruled out ENT consulted. Need a speech therapy swallow evaluation Continue with PPI twice daily.  2-Hiatal hernia and oropharyngeal esophageal dysphagia Speech therapy consulted  3-A fib RVR : Continue with Cardizem drip.  Holding Eliquis. Cardiology consulted.  Awaiting attending cardiology recommendation  4-respiratory distress after choking on p.o.: Improved.  Speech evaluation consulted.  5-Upper airway cough syndrome: Postnasal drip. Flonase added by pulmonologist.  history of diastolic heart failure: Holding diuretics  Estimated body mass index is 27.55 kg/m as calculated from the following:   Height as of this encounter: 5\' 7"  (1.702 m).   Weight as of this encounter: 79.8 kg.   DVT prophylaxis: SCD Code Status: Full code Family Communication: Care discussed with  patient Disposition Plan:  Status is: Inpatient  Remains inpatient appropriate because:Hemodynamically unstable   Dispo: The patient is from: Home              Anticipated d/c is to: Home              Anticipated d/c date is: 3 days              Patient currently is not medically stable to d/c.        Consultants:   Cardiology  ENT  Procedures:     Antimicrobials:    Subjective: Patient seen early this morning.  She reported mild sore throat.  She still having secretions  Objective: Vitals:   07/17/20 0500 07/17/20 0530 07/17/20 0600 07/17/20 0630  BP: (!) 111/63  (!) 130/63   Pulse: 79 54 73 84  Resp: 18 18 16 15   Temp:      TempSrc:      SpO2: 95% 92% 95% 94%  Weight: 79.8 kg     Height:        Intake/Output Summary (Last 24 hours) at 07/17/2020 0756 Last data filed at 07/17/2020 0600 Gross per 24 hour  Intake 2698.9 ml  Output 200 ml  Net 2498.9 ml   Filed Weights   07/16/20 1033 07/16/20 1956 07/17/20 0500  Weight: 83.5 kg 79.8 kg 79.8 kg    Examination:  General exam: Appears calm and comfortable  Respiratory system: Clear to auscultation. Respiratory effort normal. Cardiovascular system: S1 & S2 heard, RRR. No JVD, murmurs, rubs, gallops or clicks. No pedal edema. Gastrointestinal system: Abdomen is nondistended, soft and nontender. No organomegaly or masses felt. Normal bowel sounds heard. Central nervous system: Alert and oriented. No focal neurological deficits.  Extremities: Symmetric 5 x 5 power. Skin: No rashes, lesions or ulcers Psychiatry: Judgement and insight appear normal. Mood & affect appropriate.     Data Reviewed: I have personally reviewed following labs and imaging studies  CBC: Recent Labs  Lab 07/16/20 1050 07/17/20 0118  WBC 10.6* 7.6  HGB 13.1 11.8*  HCT 42.0 37.6  MCV 98.1 95.9  PLT 249 696   Basic Metabolic Panel: Recent Labs  Lab 07/16/20 1050 07/17/20 0118  NA 139 139  K 3.7 3.6  CL 99 103  CO2 26  26  GLUCOSE 132* 114*  BUN 10 8  CREATININE 0.89 0.74  CALCIUM 9.6 8.7*  MG  --  2.1   GFR: Estimated Creatinine Clearance: 61 mL/min (by C-G formula based on SCr of 0.74 mg/dL). Liver Function Tests: No results for input(s): AST, ALT, ALKPHOS, BILITOT, PROT, ALBUMIN in the last 168 hours. No results for input(s): LIPASE, AMYLASE in the last 168 hours. No results for input(s): AMMONIA in the last 168 hours. Coagulation Profile: No results for input(s): INR, PROTIME in the last 168 hours. Cardiac Enzymes: No results for input(s): CKTOTAL, CKMB, CKMBINDEX, TROPONINI in the last 168 hours. BNP (last 3 results) No results for input(s): PROBNP in the last 8760 hours. HbA1C: No results for input(s): HGBA1C in the last 72 hours. CBG: Recent Labs  Lab 07/16/20 2013 07/17/20 0037 07/17/20 0421  GLUCAP 112* 103* 90   Lipid Profile: No results for input(s): CHOL, HDL, LDLCALC, TRIG, CHOLHDL, LDLDIRECT in the last 72 hours. Thyroid Function Tests: No results for input(s): TSH, T4TOTAL, FREET4, T3FREE, THYROIDAB in the last 72 hours. Anemia Panel: No results for input(s): VITAMINB12, FOLATE, FERRITIN, TIBC, IRON, RETICCTPCT in the last 72 hours. Sepsis Labs: No results for input(s): PROCALCITON, LATICACIDVEN in the last 168 hours.  Recent Results (from the past 240 hour(s))  SARS Coronavirus 2 by RT PCR (hospital order, performed in [facility]) Nasopharyngeal Nasopharyngeal Swab     Status: None   Collection Time: 07/16/20 12:01 PM   Specimen: Nasopharyngeal Swab  Result Value Ref Range Status   SARS Coronavirus 2 NEGATIVE NEGATIVE Final    Comment: (NOTE) SARS-CoV-2 target nucleic acids are NOT DETECTED.  The SARS-CoV-2 RNA is generally detectable in upper and lower respiratory specimens during the acute phase of infection. The lowest concentration of SARS-CoV-2 viral copies this assay can detect is 250 copies / mL. A negative result does not preclude SARS-CoV-2  infection and should not be used as the sole basis for treatment or other patient management decisions.  A negative result may occur with improper specimen collection / handling, submission of specimen other than nasopharyngeal swab, presence of viral mutation(s) within the areas targeted by this assay, and inadequate number of viral copies (<250 copies / mL). A negative result must be combined with clinical observations, patient history, and epidemiological information.  Fact Sheet for Patients:   StrictlyIdeas.no  Fact Sheet for Healthcare Providers: BankingDealers.co.za  This test is not yet approved or  cleared by the [place] FDA and has been authorized for detection and/or diagnosis of SARS-CoV-2 by FDA under an Emergency Use Authorization (EUA).  This EUA will remain in effect (meaning this test can be used) for the duration of the COVID-19 declaration under Section 564(b)(1) of the Act, 21 U.S.C. section 360bbb-3(b)(1), unless the authorization is terminated or revoked sooner.  Performed at [facility], [place] [address], [place], [place] [address]   MRSA PCR Screening  Status: None   Collection Time: 07/16/20  9:00 PM   Specimen: Nasal Mucosa; Nasopharyngeal  Result Value Ref Range Status   MRSA by PCR NEGATIVE NEGATIVE Final    Comment:        The GeneXpert MRSA Assay (FDA approved for NASAL specimens only), is one component of a comprehensive MRSA colonization surveillance program. It is not intended to diagnose MRSA infection nor to guide or monitor treatment for MRSA infections. Performed at [facility], [place] [address], [place], [place] [address]          Radiology Studies: DG Neck Soft Tissue  Result Date: 07/16/2020 CLINICAL DATA:  Difficulty swallowing EXAM: NECK SOFT TISSUES - 1+ VIEW COMPARISON:  July 15, 2020 FINDINGS: The cervical spine is visualized from C1-C7. Partial  visualization of upper thoracic spine posterior fixation hardware.Cervical alignment is maintained. Vertebral body heights are maintained: no evidence of acute fracture. Multilevel intervertebral disc space height loss and osteophyte proliferation. Bilateral facet arthropathy. Osteopenia. LEFT carotid atherosclerotic calcifications. No prevertebral soft tissue swelling. Visualized thorax is unremarkable. IMPRESSION: 1. No prevertebral soft tissue edema. Consider dedicated speech swallow evaluation given indication of choking and difficulty swallowing. 2. No evidence of acute osseous abnormality. 3. LEFT carotid atherosclerotic calcifications. Electronically Signed   By: Valentino Saxon MD   On: 07/16/2020 11:27   DG Chest 2 View  Result Date: 07/16/2020 CLINICAL DATA:  Shortness of breath EXAM: CHEST - 2 VIEW COMPARISON:  07/15/2020 FINDINGS: Stable mild cardiomegaly. Known large hiatal hernia was better seen on prior imaging. Unchanged eventration of the anterior aspect of the right hemidiaphragm. No focal airspace consolidation, pleural effusion, or pneumothorax. Extensive thoracolumbar fusion hardware. Degenerative changes of the shoulders, right worse than left. IMPRESSION: No active cardiopulmonary disease.  No interval change prior. Electronically Signed   By: Davina Poke D.O.   On: 07/16/2020 11:05   CT Soft Tissue Neck W Contrast  Result Date: 07/16/2020 CLINICAL DATA:  79 year old female with recent coughing, choking, noisy respirations. Globus sensation, feels pill stuck in throat. EXAM: CT NECK WITH CONTRAST TECHNIQUE: Multidetector CT imaging of the neck was performed using the standard protocol following the bolus administration of intravenous contrast. CONTRAST:  29mL OMNIPAQUE IOHEXOL 300 MG/ML  SOLN COMPARISON:  Chest CT today reported separately. Neck soft tissue radiographs earlier today. FINDINGS: Pharynx and larynx: Larynx and epiglottis are within normal limits. Pharyngeal soft  tissue contours are within normal limits. Negative parapharyngeal spaces. Negative retropharyngeal space. A tiny postinflammatory calcification of the left palatine tonsil is suspected on series 3, image 53 and coronal image 53. No definite radiopaque foreign body identified. Salivary glands: Negative sublingual space. Submandibular and parotid glands are within normal limits. Thyroid: Negative. Lymph nodes: Negative.  No cervical lymphadenopathy. Vascular: Major vascular structures in the neck and at the skull base are patent. Tortuous carotid arteries with bilateral calcified atherosclerosis. Limited intracranial: Negative. Visualized orbits: Negative; postoperative changes to both globes. Mastoids and visualized paranasal sinuses: Paranasal sinuses, tympanic cavities and mastoids are clear. Skeleton: Upper thoracic posterior spinal fusion hardware is partially visible. No acute osseous abnormality identified. Upper chest: Reported separately. IMPRESSION: 1. A punctate density at the left palatine tonsil is favored to be a small postinflammatory calcification. Note that an impacted fishbone can have a similar appearance. But there is otherwise no radiopaque foreign body identified. 2. Otherwise negative CT appearance of the neck; tortuous carotid arteries with calcified atherosclerosis. 3.  CT Chest today reported separately. Electronically Signed   By:  Genevie Ann M.D.   On: 07/16/2020 22:52   CT Chest W Contrast  Result Date: 07/16/2020 CLINICAL DATA:  Swallowed foreign body. Patient feels pill stuck in throat. EXAM: CT CHEST WITH CONTRAST TECHNIQUE: Multidetector CT imaging of the chest was performed during intravenous contrast administration. Performed in conjunction with CT of the neck. CONTRAST:  66mL OMNIPAQUE IOHEXOL 300 MG/ML  SOLN COMPARISON:  Chest radiograph earlier today.  Chest CT 11/25/2016 FINDINGS: Cardiovascular: Tortuous thoracic aorta with atherosclerosis. No acute aortic abnormality or  aneurysm. Mild cardiomegaly. There are coronary artery calcifications. No pericardial effusion. Mediastinum/Nodes: Patulous esophagus with small volume of retained fluid, decreased retained fluid from 2017. There is a large hiatal hernia with greater than 50% of the stomach intrathoracic. No visualized radiopaque foreign body, extensive streak artifact from thoracic spinal fusion hardware partially obscures evaluation. No pneumomediastinum or evidence of perforation. No enlarged mediastinal or hilar lymph nodes. Right thyroid calcification without well-defined nodule. Lungs/Pleura: No evidence of tracheal or bronchial foreign body. There is minimal retained mucus within the distal trachea. Heterogeneous pulmonary parenchyma. Mild lower lobe bronchial thickening. Compressive atelectasis in the left lower lobe related to hiatal hernia. No pleural fluid. No findings of pulmonary edema. No pneumothorax. Upper Abdomen: Post cholecystectomy with biliary dilatation, similar to prior exam. No acute or unexpected findings. Musculoskeletal: Extensive posterior thoracic fusion with rod and intrapedicular screws. Remote T12 fracture. Focal kyphosis at T2-T3 above the level effusion is unchanged from prior exam. IMPRESSION: 1. No evidence of retained foreign body or pill. 2. Large hiatal hernia with greater than 50% of the stomach intrathoracic, unchanged from prior exam. Chronically patulous esophagus with small volume of retained fluid, decreased retained fluid from 2017. 3. Minimal mucus in the trachea.  Mild central bronchial thickening. 4. Heterogeneous pulmonary parenchyma, can be seen with small vessel or small airways disease. 5. Aortic atherosclerosis. Mild cardiomegaly with coronary artery calcifications. Aortic Atherosclerosis (ICD10-I70.0). Electronically Signed   By: Keith Rake M.D.   On: 07/16/2020 22:55        Scheduled Meds: . Chlorhexidine Gluconate Cloth  6 each Topical Daily  . [START ON  07/18/2020] fentaNYL  1 patch Transdermal Q72H  . melatonin  3 mg Oral QHS  . pantoprazole (PROTONIX) IV  40 mg Intravenous Q12H   Continuous Infusions: . sodium chloride 75 mL/hr at 07/17/20 0600  . diltiazem (CARDIZEM) infusion 15 mg/hr (07/17/20 0600)     LOS: 1 day    Time spent: 35 minutes.     Elmarie Shiley, MD [facility]   If 7PM-7AM, please contact night-coverage [email or web address]  07/17/2020, 7:56 AM

## 2020-07-17 NOTE — Progress Notes (Signed)
[place] for heparin Indication: atrial fibrillation  Heparin Dosing Weight: 78.9 kg  Labs: Recent Labs    07/16/20 1050 07/17/20 0118  HGB 13.1 11.8*  HCT 42.0 37.6  PLT 249 193  CREATININE 0.89 0.74    Assessment: 13 yof with history of afib and TIA on apixaban PTA, now with worsening SOB and afib with RVR. Patient initially presenting after episode of choking on potassium tablet. Apixaban held since admit due to unable to take PO - last dose documented on med rec as "past week." Currently on SCDs only inpatient. Pharmacy consulted to transition to heparin IV. Hg down to 11.8, plt wnl. No active bleed issues reported.  Will check baseline levels to determine if apixaban still influencing heparin level and monitor using aPTT until levels correlate.  Goal of Therapy:  Heparin level 0.3-0.7 units/ml aPTT 66-102 seconds Monitor platelets by anticoagulation protocol: Yes   Plan:  Check baseline aPTT/heparin level No bolus with potential recent apixaban dose Start heparin at 1150 units/hr Check 8hr aPTT Monitor daily heparin level/aPTT/CBC, s/sx bleeding F/u ability to transition back to PTA apixaban as appropriate   Elicia Lamp, PharmD, BCPS Clinical Pharmacist 07/17/2020 5:04 PM

## 2020-07-18 ENCOUNTER — Inpatient Hospital Stay (HOSPITAL_COMMUNITY): Payer: Medicare HMO

## 2020-07-18 DIAGNOSIS — I4891 Unspecified atrial fibrillation: Secondary | ICD-10-CM

## 2020-07-18 DIAGNOSIS — T17308A Unspecified foreign body in larynx causing other injury, initial encounter: Secondary | ICD-10-CM | POA: Diagnosis not present

## 2020-07-18 DIAGNOSIS — Z8673 Personal history of transient ischemic attack (TIA), and cerebral infarction without residual deficits: Secondary | ICD-10-CM

## 2020-07-18 DIAGNOSIS — I4821 Permanent atrial fibrillation: Secondary | ICD-10-CM | POA: Diagnosis not present

## 2020-07-18 LAB — CBC
HCT: 40 % (ref 36.0–46.0)
Hemoglobin: 12.6 g/dL (ref 12.0–15.0)
MCH: 31 pg (ref 26.0–34.0)
MCHC: 31.5 g/dL (ref 30.0–36.0)
MCV: 98.5 fL (ref 80.0–100.0)
Platelets: 170 10*3/uL (ref 150–400)
RBC: 4.06 MIL/uL (ref 3.87–5.11)
RDW: 13.5 % (ref 11.5–15.5)
WBC: 6.4 10*3/uL (ref 4.0–10.5)
nRBC: 0 % (ref 0.0–0.2)

## 2020-07-18 LAB — URINALYSIS, ROUTINE W REFLEX MICROSCOPIC
Bilirubin Urine: NEGATIVE
Glucose, UA: NEGATIVE mg/dL
Ketones, ur: 20 mg/dL — AB
Nitrite: NEGATIVE
Protein, ur: NEGATIVE mg/dL
RBC / HPF: >50 RBC/hpf — ABNORMAL HIGH (ref 0–5)
Specific Gravity, Urine: >1.046 — ABNORMAL HIGH (ref 1.005–1.030)
WBC, UA: >50 WBC/hpf — ABNORMAL HIGH (ref 0–5)
pH: 5 (ref 5.0–8.0)

## 2020-07-18 LAB — APTT
aPTT: 98 seconds — ABNORMAL HIGH (ref 24–36)
aPTT: 71 seconds — ABNORMAL HIGH (ref 24–36)

## 2020-07-18 LAB — HEPARIN LEVEL (UNFRACTIONATED): Heparin Unfractionated: 1.14 IU/mL — ABNORMAL HIGH (ref 0.30–0.70)

## 2020-07-18 LAB — GLUCOSE, CAPILLARY
Glucose-Capillary: 73 mg/dL (ref 70–99)
Glucose-Capillary: 68 mg/dL — ABNORMAL LOW (ref 70–99)
Glucose-Capillary: 119 mg/dL — ABNORMAL HIGH (ref 70–99)

## 2020-07-18 LAB — BASIC METABOLIC PANEL
Anion gap: 13 (ref 5–15)
BUN: 7 mg/dL — ABNORMAL LOW (ref 8–23)
CO2: 17 mmol/L — ABNORMAL LOW (ref 22–32)
Calcium: 8.4 mg/dL — ABNORMAL LOW (ref 8.9–10.3)
Chloride: 108 mmol/L (ref 98–111)
Creatinine, Ser: 0.7 mg/dL (ref 0.44–1.00)
GFR calc Af Amer: >60 mL/min (ref >60)
GFR calc non Af Amer: >60 mL/min (ref >60)
Glucose, Bld: 79 mg/dL (ref 70–99)
Potassium: 3.5 mmol/L (ref 3.5–5.1)
Sodium: 138 mmol/L (ref 135–145)

## 2020-07-18 MED ORDER — ALBUTEROL SULFATE (2.5 MG/3ML) 0.083% IN NEBU: 2.5000 mg | INHALATION_SOLUTION | Freq: Four times a day (QID) | RESPIRATORY_TRACT | Status: DC | PRN

## 2020-07-18 MED ORDER — DEXTROSE 50 % IV SOLN
INTRAVENOUS | Status: AC
  Administered 2020-07-18: 12.5 g via INTRAVENOUS
  Filled 2020-07-18: qty 50

## 2020-07-18 MED ORDER — DEXTROSE 50 % IV SOLN: 12.5000 g | INTRAVENOUS | Status: AC

## 2020-07-18 MED ORDER — SODIUM CHLORIDE 0.9 % IV SOLN
1.0000 g | INTRAVENOUS | Status: DC
  Administered 2020-07-18 – 2020-07-19 (×2): 1 g via INTRAVENOUS
  Filled 2020-07-18 (×2): qty 10

## 2020-07-18 MED ORDER — IOHEXOL 300 MG/ML  SOLN
100.0000 mL | Freq: Once | INTRAMUSCULAR | Status: AC | PRN
  Administered 2020-07-18: 100 mL via INTRAVENOUS

## 2020-07-18 MED ORDER — ALBUTEROL SULFATE (2.5 MG/3ML) 0.083% IN NEBU
2.5000 mg | INHALATION_SOLUTION | Freq: Four times a day (QID) | RESPIRATORY_TRACT | Status: DC
  Administered 2020-07-18 (×2): 2.5 mg via RESPIRATORY_TRACT
  Filled 2020-07-18 (×2): qty 3

## 2020-07-18 MED ORDER — ALBUTEROL SULFATE (2.5 MG/3ML) 0.083% IN NEBU
2.5000 mg | INHALATION_SOLUTION | Freq: Three times a day (TID) | RESPIRATORY_TRACT | Status: DC
  Administered 2020-07-19 – 2020-07-20 (×4): 2.5 mg via RESPIRATORY_TRACT
  Filled 2020-07-18 (×5): qty 3

## 2020-07-18 NOTE — Evaluation (Addendum)
Clinical/Bedside Swallow Evaluation Patient Details  Name: [name] [name] MRN: [id number] Date of Birth: Mar 18, 1941  Today's Date: 07/18/2020 Time: SLP Start Time (ACUTE ONLY): 0850 SLP Stop Time (ACUTE ONLY): 0925 SLP Time Calculation (min) (ACUTE ONLY): 35 min  Past Medical History:  Past Medical History:  Diagnosis Date  . Abnormal nuclear cardiac imaging test   . Anemia   . Anemia, iron deficiency 07/02/2015  . Anxiety   . Arthritis    "knees, back, fingers, toes; joints" (01/07/2015)  . 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 CAD (coronary artery disease)    a. Abnl nuc 11/2014. Cath 12/2014 - turned down for CABG. Ultimately s/p TTVP, rotational atherectomy, PTCA and stenting of the ostial LCx and left main into the LAD (crush technique), and IVUS of the LAD/Left main. // b. Myoview 11/17: EF 48, poor quality/significant artifact; inf-lateral, inferior ischemia; Intermediate Risk  . Chronic atrial fibrillation ([place])    a.  First noted post-op 9/15 spinal fusion. She had cardioversion, not on anticoagulation. Fall risk, unsteady. // failed DCCV // Holter 10/17: AFib, Avg HR 97, PVCs, no other arrhythmia  . Chronic back pain greater than 3 months duration    a. spinal stenosis. Spinal fusion with rods in 2/15 at [facility] spinal fusion 9/15.  Marland Kitchen Chronic diastolic CHF    Echo 1/55:  EF 50-55%, trivial AI, midl MR, mod LAE, PASP 37 mmHg // Echocardiogram 03/2020: EF 45-50, no RWMA, mild LVH, mild reduced RVSF, mildly elevated PASP (RVSP 38.3), mild LAE, mild MR, mild to mod TR, trivial AI   . Circadian rhythm sleep disorder   . CTS (carpal tunnel syndrome)   . Deficiency anemia 11/10/2014  . Depression   . Diarrhea   . Diverticulitis of colon   . Esophageal stricture   . Gastritis   . Gastroesophageal hernia 07/06/2013  . GERD (gastroesophageal reflux disease)   . Hiatal hernia   . History of blood transfusion    "most of them related to OR's"   . HTN (hypertension)   .  Hyperlipidemia   . IBS (irritable bowel syndrome)   . Incontinence 10/13/2012  . Insomnia   . Ischemic chest pain ([place])   . Memory disorder 12/04/2014  . Metabolic syndrome 2/0/8022  . MGUS (monoclonal gammopathy of unknown significance) dx'd 11/2014   a. Neg BMB 11/2014.  . Multiple falls   . Obesity   . Obstructive sleep apnea    "have mask; don't wear it" (01/07/2015)  . Orthostasis   . PAT (paroxysmal atrial tachycardia) ([place])   . Personal history of colonic polyps 10/25/2011 & 12/02/11   not retrieved Dr Lyla Son & tubular adenomas  . Pneumonia 03/2014  . Sinus bradycardia    a. Baseline HR 50s-60s.  . Stroke [facility]) early 2000's   "small"; denies residual on 01/07/2015)   Past Surgical History:  Past Surgical History:  Procedure Laterality Date  . BACK SURGERY    . BONE MARROW BIOPSY  11/2014  . CARDIAC CATHETERIZATION  12/27/2014   Procedure: INTRAVASCULAR PRESSURE WIRE/FFR STUDY;  Surgeon: Larey Dresser, MD;  Location: [facility] CATH LAB;  Service: Cardiovascular;;  . CARDIAC CATHETERIZATION N/A 11/25/2016   Procedure: Left Heart Cath and Coronary Angiography;  Surgeon: Sherren Mocha, MD;  Location: [place] CV LAB;  Service: Cardiovascular;  Laterality: N/A;  . CARDIOVERSION N/A 02/13/2016   Procedure: CARDIOVERSION;  Surgeon: Josue Hector, MD;  Location: [facility] ENDOSCOPY;  Service: Cardiovascular;  Laterality: N/A;  . CARPAL TUNNEL RELEASE  Right 1980's  . CATARACT EXTRACTION Bilateral   . CATARACT EXTRACTION W/ INTRAOCULAR LENS  IMPLANT, BILATERAL  2000's  . CORONARY ANGIOPLASTY WITH STENT PLACEMENT  01/07/2015   "2"  . CORONARY STENT PLACEMENT    . DILATION AND CURETTAGE OF UTERUS    . ESOPHAGOGASTRODUODENOSCOPY (EGD) WITH ESOPHAGEAL DILATION  "several times"  . JOINT REPLACEMENT    . KNEE ARTHROSCOPY Left 1995  . LAPAROSCOPIC CHOLECYSTECTOMY  2003  . LEFT HEART CATHETERIZATION WITH CORONARY ANGIOGRAM N/A 12/27/2014   Procedure: LEFT HEART CATHETERIZATION WITH CORONARY  ANGIOGRAM;  Surgeon: Larey Dresser, MD;  Location: [facility] CATH LAB;  Service: Cardiovascular;  Laterality: N/A;  . PERCUTANEOUS CORONARY ROTOBLATOR INTERVENTION (PCI-R)  01/07/2015  . PERCUTANEOUS CORONARY ROTOBLATOR INTERVENTION (PCI-R) N/A 01/07/2015   Procedure: PERCUTANEOUS CORONARY ROTOBLATOR INTERVENTION (PCI-R);  Surgeon: Blane Ohara, MD;  Location: [facility] CATH LAB;  Service: Cardiovascular;  Laterality: N/A;  . POSTERIOR FUSION THORACIC SPINE  08/2015  . POSTERIOR LUMBAR FUSION  01/2015  . TOTAL KNEE ARTHROPLASTY Bilateral 1990's - 2000's   HPI:  79yo female admitted 07/16/20 with acute respiratory distress following choking on a potassium pill. PMH: hiatal hernia, oropharyngeal and esophageal dysphagia with esophageal stricture, IBS, anxiety, depression, chronic diastolic heart failure, CAD, PCI, AFIb, HTN, HLD, anemia, GERD. BaSw 2011 - dysmotility with tertiary contractions, delayed emptying.   Assessment / Plan / Recommendation Clinical Impression  Pt seen for bedside assessment of swallow function and safety. Pt has a long-standing history of dysphagia with MBS completed in April 2020 showing penetration of thin liquids,  Barium Swallow in 2011 indicated tertiary contractions. Today, pt presents with missing dentition, but adequate oral motor strength and function. Pt completed oral care after set up. Following oral care, pt accepted ice chips, thin liquid, and puree textures. No overt s/s aspiration on any PO presentation, however, pt reported globus sensation following puree. Given history, recommend repeat Regular barium swallow to evaluate esophageal dysmotiilty. SLP will follow up after results are avaialble for education and to determine if further workup is needed. Recommend full liquid diet, crushed meds in the interim.   SLP Visit Diagnosis: Dysphagia, unspecified (R13.10)    Aspiration Risk  Mild aspiration risk    Diet Recommendation Other (Comment);Thin liquid (full liquid)    Liquid Administration via: Straw;Cup Medication Administration: Crushed with puree Supervision: Patient able to self feed Compensations: Slow rate;Small sips/bites Postural Changes: Seated upright at 90 degrees    Other  Recommendations Oral Care Recommendations: Oral care QID   Follow up Recommendations Other (comment) (TBD)      Frequency and Duration min 1 x/week  1 week;2 weeks       Prognosis Prognosis for Safe Diet Advancement: Good      Swallow Study   General Date of Onset: 07/16/20 HPI: 79yo female admitted 07/16/20 with acute respiratory distress following choking on a potassium pill. PMH: hiatal hernia, oropharyngeal and esophageal dysphagia with esophageal stricture, IBS, anxiety, depression, chronic diastolic heart failure, CAD, PCI, AFIb, HTN, HLD, anemia, GERD. BaSw 2011 - dysmotility with tertiary contractions, delayed emptying. Type of Study: Bedside Swallow Evaluation Previous Swallow Assessment: none found. BaSw 2011 - dysmotility with tertiary contractions, delayed emptying. Diet Prior to this Study: NPO Temperature Spikes Noted: No Respiratory Status: Nasal cannula History of Recent Intubation: No Behavior/Cognition: Alert;Cooperative;Pleasant mood Oral Cavity Assessment: Within Functional Limits Oral Care Completed by SLP: Yes Vision: Functional for self-feeding Self-Feeding Abilities: Able to feed self Patient Positioning: Upright in bed Baseline Vocal Quality:  Hoarse Volitional Cough: Strong Volitional Swallow: Able to elicit    Oral/Motor/Sensory Function Overall Oral Motor/Sensory Function: Within functional limits   Ice Chips Ice chips: Within functional limits Presentation: Spoon   Thin Liquid Thin Liquid: Within functional limits Presentation: Straw;Cup;Self Fed    Nectar Thick Nectar Thick Liquid: Not tested   Honey Thick Honey Thick Liquid: Not tested   Puree Puree: Impaired Presentation: Self Fed;Spoon Other Comments: globus  sensation   Solid     Solid: Not tested     Laray Corbit B. Quentin Ore, [facility], [place] Speech Language Pathologist Office: [phone] Pager: [phone]  Shonna Chock 07/18/2020,9:51 AM

## 2020-07-18 NOTE — Progress Notes (Signed)
ANTICOAGULATION CONSULT NOTE - Follow Up Consult  Pharmacy Consult for Heparin Indication: atrial fibrillation  Allergies  Allergen Reactions  . Buprenorphine Hcl Itching  . Cymbalta [Duloxetine Hcl] Other (See Comments)    Other reaction(s): Other (See Comments) Personality changes - crying  2014  . Morphine And Related Itching  . Oxycodone-Acetaminophen Other (See Comments)    Personality changes   . Valium [Diazepam] Other (See Comments)    Personality changes - "in another world" ;  Hallucinations  2015  . Statins Other (See Comments)    Other reaction(s): Other (See Comments) Muscle weakness Muscle weakness  . Altace [Ramipril] Other (See Comments)    unknown  . Floxin [Ofloxacin] Other (See Comments)    unknown  . Hydromorphone Other (See Comments)    Other reaction(s): Confusion (intolerance) unknown  . Lovaza [Omega-3-Acid Ethyl Esters] Other (See Comments)    Muscle weakness   . Trilipix [Choline Fenofibrate] Other (See Comments)    Muscle weakness   . Zetia [Ezetimibe] Other (See Comments)    Muscle weakness     Patient Measurements: Height: 5\' 7"  (170.2 cm) Weight: 79.8 kg (175 lb 14.8 oz) IBW/kg (Calculated) : 61.6  Vital Signs: Temp: 98 F (36.7 C) (07/29 0354) Temp Source: Oral (07/29 0354) BP: 108/59 (07/29 0600) Pulse Rate: 76 (07/29 0600)  Labs: Recent Labs    07/16/20 1050 07/16/20 1050 07/17/20 0118 07/17/20 1850 07/18/20 0314 07/18/20 0500  HGB 13.1   < > 11.8*  --  12.6  --   HCT 42.0  --  37.6  --  40.0  --   PLT 249  --  193  --  170  --   APTT  --   --   --  38*  --  98*  HEPARINUNFRC  --   --   --  1.09*  --  1.14*  CREATININE 0.89  --  0.74  --   --   --    < > = values in this interval not displayed.    Estimated Creatinine Clearance: 61 mL/min (by C-G formula based on SCr of 0.74 mg/dL).  Assessment: 79 y.o. female with h/o Afib, Eliquis on hold, for heparin  Goal of Therapy:  APTT 66-102 sec Heparin level 0.3-0.7  units/ml Monitor platelets by anticoagulation protocol: Yes   Plan:  Continue Heparin at current rate   Phillis Knack, PharmD, BCPS   Marlaya Turck, [place] 07/18/2020,6:32 AM

## 2020-07-18 NOTE — Progress Notes (Signed)
RT instructed patient on the use of flutter valve. Patient able to demonstrate back good technique.   

## 2020-07-18 NOTE — Progress Notes (Signed)
PROGRESS NOTE    [name] [name]  JIR:[id number] DOB: 02-02-1941 DOA: 07/16/2020 PCP: [name], DO   Brief Narrative: 79 year old with known past medical history of hiatal hernia, esophageal and oral pharyngeal dysphagia.  On 7/26 she had an episode where she choked on potassium pills.  She developed progressive increased work of breathing, copious upper airway secretion and worsening turbulent upper airway noises, initial soft tissue neck imaging without evidence of airway obstruction.  Critical care admitted patient due to worsening shortness of breath and new A. fib with RVR.  7/26 choking event on potassium pill, seen, imaging all without evidence of upper airway obstruction or aspiration discharge to home. 7/27 patient present back to the ED continue to complain of sore throat unable to breath and cough.  Copious oral secretions developed rapid ventricular response on telemetry   Assessment & Plan:   Active Problems:   Acute respiratory distress   Choking  1-Dysphagia; cough;  Remained stable overnight CT soft tissue neck and chest showed tonsil calcification, fishbone cannot be ruled out ENT consulted: underwent flexible fiberoptic laryngoscopy; examination unremarkable.  Esophagogram; showed esophageal spasm, moderate to large sliding hiatal hernia.  Continue with PPI twice daily. Full liquid diet. Awaiting speech follow up.   2-Hiatal hernia and oropharyngeal esophageal dysphagia Speech therapy consulted Esophagogram; showed esophageal spasm, moderate to large sliding hiatal hernia.  Left side upper quadrant abdominal pain. CT abdomen pelvis: Bladder wall appears thickened although decompressed and difficult to evaluate. Cannot exclude cystitis. Punctate nonobstructing right midpole renal stone. Hiatal hernia. -Will dicussed with surgery   3-A fib RVR : Continue with Cardizem drip.  Holding Eliquis. Cardiology consulted.  On heparin gtt.   4-respiratory distress  after choking on p.o.: Improved/   5-Upper airway cough syndrome: Postnasal drip. Flonase added by pulmonologist.  history of diastolic heart failure: Holding diuretics. Resume tomorrow.   Estimated body mass index is 27.55 kg/m as calculated from the following:   Height as of this encounter: 5\' 7"  (1.702 m).   Weight as of this encounter: 79.8 kg.   DVT prophylaxis: SCD Code Status: Full code Family Communication: Care discussed with patient and daughter who was at bedside.  Disposition Plan:  Status is: Inpatient  Remains inpatient appropriate because:Hemodynamically unstable   Dispo: The patient is from: Home              Anticipated d/c is to: Home              Anticipated d/c date is: 2 days              Patient currently is not medically stable to d/c.        Consultants:   Cardiology  ENT  Procedures:     Antimicrobials:    Subjective: She report improvement  Of cough. She wants to eat.  Report dysuria a day ago.  She report left side abdominal pain for last 2 weeks, specially in the morning. Constant when happen. She is  Abdominal pain free currently.   Objective: Vitals:   07/18/20 0354 07/18/20 0400 07/18/20 0500 07/18/20 0600  BP:  (!) 134/66 (!) 109/61 (!) 108/59  Pulse:  (!) 164 58 76  Resp:  21 17 19   Temp: 98 F (36.7 C)     TempSrc: Oral     SpO2:  94% 95% 92%  Weight:      Height:        Intake/Output Summary (Last 24 hours) at 07/18/2020 [phone]  Last data filed at 07/18/2020 0600 Gross per 24 hour  Intake 2048.03 ml  Output 550 ml  Net 1498.03 ml   Filed Weights   07/16/20 1033 07/16/20 1956 07/17/20 0500  Weight: 83.5 kg 79.8 kg 79.8 kg    Examination:  General exam: NAD Respiratory system: CTA Cardiovascular system: S 1, S 2 RRR Gastrointestinal system: BS present, soft, nt Central nervous system: alert Extremities: no edema Skin: no rashes   Data Reviewed: I have personally reviewed following labs and imaging  studies  CBC: Recent Labs  Lab 07/16/20 1050 07/17/20 0118 07/18/20 0314  WBC 10.6* 7.6 6.4  HGB 13.1 11.8* 12.6  HCT 42.0 37.6 40.0  MCV 98.1 95.9 98.5  PLT 249 193 836   Basic Metabolic Panel: Recent Labs  Lab 07/16/20 1050 07/17/20 0118  NA 139 139  K 3.7 3.6  CL 99 103  CO2 26 26  GLUCOSE 132* 114*  BUN 10 8  CREATININE 0.89 0.74  CALCIUM 9.6 8.7*  MG  --  2.1   GFR: Estimated Creatinine Clearance: 61 mL/min (by C-G formula based on SCr of 0.74 mg/dL). Liver Function Tests: No results for input(s): AST, ALT, ALKPHOS, BILITOT, PROT, ALBUMIN in the last 168 hours. No results for input(s): LIPASE, AMYLASE in the last 168 hours. No results for input(s): AMMONIA in the last 168 hours. Coagulation Profile: No results for input(s): INR, PROTIME in the last 168 hours. Cardiac Enzymes: No results for input(s): CKTOTAL, CKMB, CKMBINDEX, TROPONINI in the last 168 hours. BNP (last 3 results) No results for input(s): PROBNP in the last 8760 hours. HbA1C: No results for input(s): HGBA1C in the last 72 hours. CBG: Recent Labs  Lab 07/17/20 1631 07/17/20 2000 07/17/20 2341 07/18/20 0338 07/18/20 0828  GLUCAP 89 77 70 73 68*   Lipid Profile: No results for input(s): CHOL, HDL, LDLCALC, TRIG, CHOLHDL, LDLDIRECT in the last 72 hours. Thyroid Function Tests: No results for input(s): TSH, T4TOTAL, FREET4, T3FREE, THYROIDAB in the last 72 hours. Anemia Panel: No results for input(s): VITAMINB12, FOLATE, FERRITIN, TIBC, IRON, RETICCTPCT in the last 72 hours. Sepsis Labs: No results for input(s): PROCALCITON, LATICACIDVEN in the last 168 hours.  Recent Results (from the past 240 hour(s))  SARS Coronavirus 2 by RT PCR (hospital order, performed in [facility] hospital lab) Nasopharyngeal Nasopharyngeal Swab     Status: None   Collection Time: 07/16/20 12:01 PM   Specimen: Nasopharyngeal Swab  Result Value Ref Range Status   SARS Coronavirus 2 NEGATIVE NEGATIVE Final     Comment: (NOTE) SARS-CoV-2 target nucleic acids are NOT DETECTED.  The SARS-CoV-2 RNA is generally detectable in upper and lower respiratory specimens during the acute phase of infection. The lowest concentration of SARS-CoV-2 viral copies this assay can detect is 250 copies / mL. A negative result does not preclude SARS-CoV-2 infection and should not be used as the sole basis for treatment or other patient management decisions.  A negative result may occur with improper specimen collection / handling, submission of specimen other than nasopharyngeal swab, presence of viral mutation(s) within the areas targeted by this assay, and inadequate number of viral copies (<250 copies / mL). A negative result must be combined with clinical observations, patient history, and epidemiological information.  Fact Sheet for Patients:   StrictlyIdeas.no  Fact Sheet for Healthcare Providers: BankingDealers.co.za  This test is not yet approved or  cleared by the [place] FDA and has been authorized for detection and/or diagnosis of SARS-CoV-2 by  FDA under an Emergency Use Authorization (EUA).  This EUA will remain in effect (meaning this test can be used) for the duration of the COVID-19 declaration under Section 564(b)(1) of the Act, 21 U.S.C. section 360bbb-3(b)(1), unless the authorization is terminated or revoked sooner.  Performed at [facility], [place] [address], [place], [place] [address]   MRSA PCR Screening     Status: None   Collection Time: 07/16/20  9:00 PM   Specimen: Nasal Mucosa; Nasopharyngeal  Result Value Ref Range Status   MRSA by PCR NEGATIVE NEGATIVE Final    Comment:        The GeneXpert MRSA Assay (FDA approved for NASAL specimens only), is one component of a comprehensive MRSA colonization surveillance program. It is not intended to diagnose MRSA infection nor to guide or monitor treatment for MRSA  infections. Performed at [facility], [place] [address] [place], [place] [address]          Radiology Studies: DG Neck Soft Tissue  Result Date: 07/16/2020 CLINICAL DATA:  Difficulty swallowing EXAM: NECK SOFT TISSUES - 1+ VIEW COMPARISON:  July 15, 2020 FINDINGS: The cervical spine is visualized from C1-C7. Partial visualization of upper thoracic spine posterior fixation hardware.Cervical alignment is maintained. Vertebral body heights are maintained: no evidence of acute fracture. Multilevel intervertebral disc space height loss and osteophyte proliferation. Bilateral facet arthropathy. Osteopenia. LEFT carotid atherosclerotic calcifications. No prevertebral soft tissue swelling. Visualized thorax is unremarkable. IMPRESSION: 1. No prevertebral soft tissue edema. Consider dedicated speech swallow evaluation given indication of choking and difficulty swallowing. 2. No evidence of acute osseous abnormality. 3. LEFT carotid atherosclerotic calcifications. Electronically Signed   By: Valentino Saxon MD   On: 07/16/2020 11:27   DG Chest 2 View  Result Date: 07/16/2020 CLINICAL DATA:  Shortness of breath EXAM: CHEST - 2 VIEW COMPARISON:  07/15/2020 FINDINGS: Stable mild cardiomegaly. Known large hiatal hernia was better seen on prior imaging. Unchanged eventration of the anterior aspect of the right hemidiaphragm. No focal airspace consolidation, pleural effusion, or pneumothorax. Extensive thoracolumbar fusion hardware. Degenerative changes of the shoulders, right worse than left. IMPRESSION: No active cardiopulmonary disease.  No interval change prior. Electronically Signed   By: Davina Poke D.O.   On: 07/16/2020 11:05   CT Soft Tissue Neck W Contrast  Result Date: 07/16/2020 CLINICAL DATA:  79 year old female with recent coughing, choking, noisy respirations. Globus sensation, feels pill stuck in throat. EXAM: CT NECK WITH CONTRAST TECHNIQUE: Multidetector CT imaging of the neck was  performed using the standard protocol following the bolus administration of intravenous contrast. CONTRAST:  88mL OMNIPAQUE IOHEXOL 300 MG/ML  SOLN COMPARISON:  Chest CT today reported separately. Neck soft tissue radiographs earlier today. FINDINGS: Pharynx and larynx: Larynx and epiglottis are within normal limits. Pharyngeal soft tissue contours are within normal limits. Negative parapharyngeal spaces. Negative retropharyngeal space. A tiny postinflammatory calcification of the left palatine tonsil is suspected on series 3, image 53 and coronal image 53. No definite radiopaque foreign body identified. Salivary glands: Negative sublingual space. Submandibular and parotid glands are within normal limits. Thyroid: Negative. Lymph nodes: Negative.  No cervical lymphadenopathy. Vascular: Major vascular structures in the neck and at the skull base are patent. Tortuous carotid arteries with bilateral calcified atherosclerosis. Limited intracranial: Negative. Visualized orbits: Negative; postoperative changes to both globes. Mastoids and visualized paranasal sinuses: Paranasal sinuses, tympanic cavities and mastoids are clear. Skeleton: Upper thoracic posterior spinal fusion hardware is partially visible. No acute osseous abnormality  identified. Upper chest: Reported separately. IMPRESSION: 1. A punctate density at the left palatine tonsil is favored to be a small postinflammatory calcification. Note that an impacted fishbone can have a similar appearance. But there is otherwise no radiopaque foreign body identified. 2. Otherwise negative CT appearance of the neck; tortuous carotid arteries with calcified atherosclerosis. 3.  CT Chest today reported separately. Electronically Signed   By: Genevie Ann M.D.   On: 07/16/2020 22:52   CT Chest W Contrast  Result Date: 07/16/2020 CLINICAL DATA:  Swallowed foreign body. Patient feels pill stuck in throat. EXAM: CT CHEST WITH CONTRAST TECHNIQUE: Multidetector CT imaging of the  chest was performed during intravenous contrast administration. Performed in conjunction with CT of the neck. CONTRAST:  31mL OMNIPAQUE IOHEXOL 300 MG/ML  SOLN COMPARISON:  Chest radiograph earlier today.  Chest CT 11/25/2016 FINDINGS: Cardiovascular: Tortuous thoracic aorta with atherosclerosis. No acute aortic abnormality or aneurysm. Mild cardiomegaly. There are coronary artery calcifications. No pericardial effusion. Mediastinum/Nodes: Patulous esophagus with small volume of retained fluid, decreased retained fluid from 2017. There is a large hiatal hernia with greater than 50% of the stomach intrathoracic. No visualized radiopaque foreign body, extensive streak artifact from thoracic spinal fusion hardware partially obscures evaluation. No pneumomediastinum or evidence of perforation. No enlarged mediastinal or hilar lymph nodes. Right thyroid calcification without well-defined nodule. Lungs/Pleura: No evidence of tracheal or bronchial foreign body. There is minimal retained mucus within the distal trachea. Heterogeneous pulmonary parenchyma. Mild lower lobe bronchial thickening. Compressive atelectasis in the left lower lobe related to hiatal hernia. No pleural fluid. No findings of pulmonary edema. No pneumothorax. Upper Abdomen: Post cholecystectomy with biliary dilatation, similar to prior exam. No acute or unexpected findings. Musculoskeletal: Extensive posterior thoracic fusion with rod and intrapedicular screws. Remote T12 fracture. Focal kyphosis at T2-T3 above the level effusion is unchanged from prior exam. IMPRESSION: 1. No evidence of retained foreign body or pill. 2. Large hiatal hernia with greater than 50% of the stomach intrathoracic, unchanged from prior exam. Chronically patulous esophagus with small volume of retained fluid, decreased retained fluid from 2017. 3. Minimal mucus in the trachea.  Mild central bronchial thickening. 4. Heterogeneous pulmonary parenchyma, can be seen with small  vessel or small airways disease. 5. Aortic atherosclerosis. Mild cardiomegaly with coronary artery calcifications. Aortic Atherosclerosis (ICD10-I70.0). Electronically Signed   By: Keith Rake M.D.   On: 07/16/2020 22:55        Scheduled Meds:  dextrose       Chlorhexidine Gluconate Cloth  6 each Topical Daily   fentaNYL  1 patch Transdermal Q72H   fluticasone  1 spray Each Nare Daily   guaiFENesin  600 mg Oral BID   melatonin  3 mg Oral QHS   pantoprazole (PROTONIX) IV  40 mg Intravenous Q12H   sodium chloride  2 spray Each Nare BID   Continuous Infusions:  diltiazem (CARDIZEM) infusion 10 mg/hr (07/18/20 0600)   heparin 1,150 Units/hr (07/18/20 0600)     LOS: 2 days    Time spent: 35 minutes.     Elmarie Shiley, MD [facility]   If 7PM-7AM, please contact night-coverage [email or web address]  07/18/2020, 8:43 AM

## 2020-07-18 NOTE — Progress Notes (Signed)
ANTICOAGULATION CONSULT NOTE  Pharmacy Consult for Heparin Indication: atrial fibrillation  Allergies  Allergen Reactions   Buprenorphine Hcl Itching   Cymbalta [Duloxetine Hcl] Other (See Comments)    Other reaction(s): Other (See Comments) Personality changes - crying  2014   Morphine And Related Itching   Oxycodone-Acetaminophen Other (See Comments)    Personality changes    Valium [Diazepam] Other (See Comments)    Personality changes - "in another world" ;  Hallucinations  2015   Statins Other (See Comments)    Other reaction(s): Other (See Comments) Muscle weakness Muscle weakness   Altace [Ramipril] Other (See Comments)    unknown   Floxin [Ofloxacin] Other (See Comments)    unknown   Hydromorphone Other (See Comments)    Other reaction(s): Confusion (intolerance) unknown   Lovaza [Omega-3-Acid Ethyl Esters] Other (See Comments)    Muscle weakness    Trilipix [Choline Fenofibrate] Other (See Comments)    Muscle weakness    Zetia [Ezetimibe] Other (See Comments)    Muscle weakness     Patient Measurements: Height: 5\' 7"  (170.2 cm) Weight: 79.8 kg (175 lb 14.8 oz) IBW/kg (Calculated) : 61.6  Vital Signs: Temp: 98.7 F (37.1 C) (07/29 1247) Temp Source: Oral (07/29 1247) BP: 123/79 (07/29 1247) Pulse Rate: 104 (07/29 1441)  Labs: Recent Labs    07/16/20 1050 07/16/20 1050 07/17/20 0118 07/17/20 1850 07/18/20 0314 07/18/20 0500 07/18/20 1452  HGB 13.1   < > 11.8*  --  12.6  --   --   HCT 42.0  --  37.6  --  40.0  --   --   PLT 249  --  193  --  170  --   --   APTT  --   --   --  38*  --  98* 71*  HEPARINUNFRC  --   --   --  1.09*  --  1.14*  --   CREATININE 0.89  --  0.74  --   --  0.70  --    < > = values in this interval not displayed.    Estimated Creatinine Clearance: 61 mL/min (by C-G formula based on SCr of 0.7 mg/dL).  Assessment: 79 y.o. female with history of Afib to continue on IV heparin while Eliquis is on hold. APTT  therapeutic; no bleeding reported.  Goal of Therapy:  APTT 66-102 sec Heparin level 0.3-0.7 units/ml Monitor platelets by anticoagulation protocol: Yes   Plan:  Continue heparin infusion at 1150 units/hr F/U AM labs  Izza Bickle D. Mina Marble, PharmD, BCPS, [place] 07/18/2020, 3:49 PM

## 2020-07-18 NOTE — Progress Notes (Signed)
Progress Note  Patient Name: [name] [name] Date of Encounter: 07/18/2020  [facility] Cardiologist: Sherren Mocha, MD  Subjective   Seen while speech language pathologist at bedside, discussing further evaluation with barium swallow. Remains strict NPO. No cardiac complaints. On supplemental O2 by nasal cannula as O2 sats dipped overnight.  Inpatient Medications    Scheduled Meds: . albuterol  2.5 mg Nebulization Q6H  . Chlorhexidine Gluconate Cloth  6 each Topical Daily  . fentaNYL  1 patch Transdermal Q72H  . fluticasone  1 spray Each Nare Daily  . guaiFENesin  600 mg Oral BID  . melatonin  3 mg Oral QHS  . pantoprazole (PROTONIX) IV  40 mg Intravenous Q12H  . sodium chloride  2 spray Each Nare BID   Continuous Infusions: . diltiazem (CARDIZEM) infusion 5 mg/hr (07/18/20 0939)  . heparin 1,150 Units/hr (07/18/20 0900)   PRN Meds:    Vital Signs    Vitals:   07/18/20 0600 07/18/20 0800 07/18/20 0849 07/18/20 0900  BP: (!) 108/59 (!) 119/46  125/68  Pulse: 76 80  88  Resp: 19 13  14   Temp:   97.9 F (36.6 C)   TempSrc:   Oral   SpO2: 92% 95%  94%  Weight:      Height:        Intake/Output Summary (Last 24 hours) at 07/18/2020 0952 Last data filed at 07/18/2020 0900 Gross per 24 hour  Intake 2245.73 ml  Output 650 ml  Net 1595.73 ml   Last 3 Weights 07/17/2020 07/16/2020 07/16/2020  Weight (lbs) 175 lb 14.8 oz 175 lb 14.8 oz 184 lb  Weight (kg) 79.8 kg 79.8 kg 83.462 kg      Telemetry    Atrial fibrillation, with rates 80s-120s. Occasional ventricular ectopy - Personally Reviewed  ECG    Afib RVR at 134 bpm 07/16/20 - Personally Reviewed  Physical Exam   GEN: No acute distress.  Sitting upright, Ackerman in place Neck: No JVD visible Cardiac: irregularly irregular S1 and S2, no appreciable m/r/g Respiratory: Diffusely coarse but slightly improved from yesterday GI: Soft, nontender, non-distended  MS: No edema; No deformity. Neuro:  Nonfocal   Psych: Normal affect   Labs    High Sensitivity Troponin:  No results for input(s): TROPONINIHS in the last 720 hours.    Chemistry Recent Labs  Lab 07/16/20 1050 07/17/20 0118 07/18/20 0500  NA 139 139 138  K 3.7 3.6 3.5  CL 99 103 108  CO2 26 26 17*  GLUCOSE 132* 114* 79  BUN 10 8 7*  CREATININE 0.89 0.74 0.70  CALCIUM 9.6 8.7* 8.4*  GFRNONAA >60 >60 >60  GFRAA >60 >60 >60  ANIONGAP 14 10 13      Hematology Recent Labs  Lab 07/16/20 1050 07/17/20 0118 07/18/20 0314  WBC 10.6* 7.6 6.4  RBC 4.28 3.92 4.06  HGB 13.1 11.8* 12.6  HCT 42.0 37.6 40.0  MCV 98.1 95.9 98.5  MCH 30.6 30.1 31.0  MCHC 31.2 31.4 31.5  RDW 13.2 13.4 13.5  PLT 249 193 170    BNPNo results for input(s): BNP, PROBNP in the last 168 hours.   DDimer No results for input(s): DDIMER in the last 168 hours.   Radiology    DG Neck Soft Tissue  Result Date: 07/16/2020 CLINICAL DATA:  Difficulty swallowing EXAM: NECK SOFT TISSUES - 1+ VIEW COMPARISON:  July 15, 2020 FINDINGS: The cervical spine is visualized from C1-C7. Partial visualization of upper thoracic spine posterior  fixation hardware.Cervical alignment is maintained. Vertebral body heights are maintained: no evidence of acute fracture. Multilevel intervertebral disc space height loss and osteophyte proliferation. Bilateral facet arthropathy. Osteopenia. LEFT carotid atherosclerotic calcifications. No prevertebral soft tissue swelling. Visualized thorax is unremarkable. IMPRESSION: 1. No prevertebral soft tissue edema. Consider dedicated speech swallow evaluation given indication of choking and difficulty swallowing. 2. No evidence of acute osseous abnormality. 3. LEFT carotid atherosclerotic calcifications. Electronically Signed   By: Valentino Saxon MD   On: 07/16/2020 11:27   DG Chest 2 View  Result Date: 07/16/2020 CLINICAL DATA:  Shortness of breath EXAM: CHEST - 2 VIEW COMPARISON:  07/15/2020 FINDINGS: Stable mild cardiomegaly. Known  large hiatal hernia was better seen on prior imaging. Unchanged eventration of the anterior aspect of the right hemidiaphragm. No focal airspace consolidation, pleural effusion, or pneumothorax. Extensive thoracolumbar fusion hardware. Degenerative changes of the shoulders, right worse than left. IMPRESSION: No active cardiopulmonary disease.  No interval change prior. Electronically Signed   By: Davina Poke D.O.   On: 07/16/2020 11:05   CT Soft Tissue Neck W Contrast  Result Date: 07/16/2020 CLINICAL DATA:  79 year old female with recent coughing, choking, noisy respirations. Globus sensation, feels pill stuck in throat. EXAM: CT NECK WITH CONTRAST TECHNIQUE: Multidetector CT imaging of the neck was performed using the standard protocol following the bolus administration of intravenous contrast. CONTRAST:  64mL OMNIPAQUE IOHEXOL 300 MG/ML  SOLN COMPARISON:  Chest CT today reported separately. Neck soft tissue radiographs earlier today. FINDINGS: Pharynx and larynx: Larynx and epiglottis are within normal limits. Pharyngeal soft tissue contours are within normal limits. Negative parapharyngeal spaces. Negative retropharyngeal space. A tiny postinflammatory calcification of the left palatine tonsil is suspected on series 3, image 53 and coronal image 53. No definite radiopaque foreign body identified. Salivary glands: Negative sublingual space. Submandibular and parotid glands are within normal limits. Thyroid: Negative. Lymph nodes: Negative.  No cervical lymphadenopathy. Vascular: Major vascular structures in the neck and at the skull base are patent. Tortuous carotid arteries with bilateral calcified atherosclerosis. Limited intracranial: Negative. Visualized orbits: Negative; postoperative changes to both globes. Mastoids and visualized paranasal sinuses: Paranasal sinuses, tympanic cavities and mastoids are clear. Skeleton: Upper thoracic posterior spinal fusion hardware is partially visible. No acute  osseous abnormality identified. Upper chest: Reported separately. IMPRESSION: 1. A punctate density at the left palatine tonsil is favored to be a small postinflammatory calcification. Note that an impacted fishbone can have a similar appearance. But there is otherwise no radiopaque foreign body identified. 2. Otherwise negative CT appearance of the neck; tortuous carotid arteries with calcified atherosclerosis. 3.  CT Chest today reported separately. Electronically Signed   By: Genevie Ann M.D.   On: 07/16/2020 22:52   CT Chest W Contrast  Result Date: 07/16/2020 CLINICAL DATA:  Swallowed foreign body. Patient feels pill stuck in throat. EXAM: CT CHEST WITH CONTRAST TECHNIQUE: Multidetector CT imaging of the chest was performed during intravenous contrast administration. Performed in conjunction with CT of the neck. CONTRAST:  13mL OMNIPAQUE IOHEXOL 300 MG/ML  SOLN COMPARISON:  Chest radiograph earlier today.  Chest CT 11/25/2016 FINDINGS: Cardiovascular: Tortuous thoracic aorta with atherosclerosis. No acute aortic abnormality or aneurysm. Mild cardiomegaly. There are coronary artery calcifications. No pericardial effusion. Mediastinum/Nodes: Patulous esophagus with small volume of retained fluid, decreased retained fluid from 2017. There is a large hiatal hernia with greater than 50% of the stomach intrathoracic. No visualized radiopaque foreign body, extensive streak artifact from thoracic spinal fusion hardware partially obscures  evaluation. No pneumomediastinum or evidence of perforation. No enlarged mediastinal or hilar lymph nodes. Right thyroid calcification without well-defined nodule. Lungs/Pleura: No evidence of tracheal or bronchial foreign body. There is minimal retained mucus within the distal trachea. Heterogeneous pulmonary parenchyma. Mild lower lobe bronchial thickening. Compressive atelectasis in the left lower lobe related to hiatal hernia. No pleural fluid. No findings of pulmonary edema. No  pneumothorax. Upper Abdomen: Post cholecystectomy with biliary dilatation, similar to prior exam. No acute or unexpected findings. Musculoskeletal: Extensive posterior thoracic fusion with rod and intrapedicular screws. Remote T12 fracture. Focal kyphosis at T2-T3 above the level effusion is unchanged from prior exam. IMPRESSION: 1. No evidence of retained foreign body or pill. 2. Large hiatal hernia with greater than 50% of the stomach intrathoracic, unchanged from prior exam. Chronically patulous esophagus with small volume of retained fluid, decreased retained fluid from 2017. 3. Minimal mucus in the trachea.  Mild central bronchial thickening. 4. Heterogeneous pulmonary parenchyma, can be seen with small vessel or small airways disease. 5. Aortic atherosclerosis. Mild cardiomegaly with coronary artery calcifications. Aortic Atherosclerosis (ICD10-I70.0). Electronically Signed   By: Keith Rake M.D.   On: 07/16/2020 22:55   DG CHEST PORT 1 VIEW  Result Date: 07/18/2020 CLINICAL DATA:  Cough, shortness of breath, low LEFT anterior chest pain, history hypertension, coronary artery disease, atrial fibrillation post cardioversion, CHF EXAM: PORTABLE CHEST 1 VIEW COMPARISON:  Portable exam 0844 hours compared to 07/16/2020 FINDINGS: Enlargement of cardiac silhouette. Pulmonary vascularity normal. Eventration of RIGHT diaphragm again seen. Large hiatal hernia. Atelectasis versus consolidation at LEFT base. Remaining lungs clear. No pleural effusion or pneumothorax. Bones demineralized. Extensive orthopedic hardware of thoracolumbar spine. IMPRESSION: Large hiatal hernia. Atelectasis versus consolidation LEFT lower lobe. Electronically Signed   By: Lavonia Dana M.D.   On: 07/18/2020 08:53    Cardiac Studies   ECHO: 04/15/2020 1. Difficult acoustic windows. Atrial fibrillation makes accurate  assessment of LVEF difficult. LVEF is probab mildly decreased, does not  appear to be significantly differnt than  previous echo images . Left  ventricular ejection fraction, by estimation,  is 45 to 50%. The left ventricle has mildly decreased function. The left  ventricle has no regional wall motion abnormalities. There is mild left  ventricular hypertrophy. Left ventricular diastolic parameters are  indeterminate.  2. Right ventricular systolic function is mildly reduced. The right  ventricular size is normal. There is mildly elevated pulmonary artery  systolic pressure.  3. Left atrial size was mildly dilated.  4. The mitral valve is normal in structure. Mild mitral valve  regurgitation.  5. Tricuspid valve regurgitation is mild to moderate.  6. The aortic valve is normal in structure. Aortic valve regurgitation is  trivial.   MYOVIEW: 05/29/2020  Nuclear stress EF: 44%.  There was no ST segment deviation noted during stress.  The study is normal.  The left ventricular ejection fraction is moderately decreased (30-44%).  This is an intermediate risk study due to reduced systolic function. There is no ischemia.  CATH: 11/25/2016 Continued patency of the distal left mainstem/proximal LAD/proximal circumflex bifurcation stents with mild ISR of the proximal circumflex  Mild calcific, nonobstructive CAD  Recommend: continued medical therapy for CAD, CHF, and atrial fibrillation Left Main  Previously placed Ost LM to LM drug eluting stent is widely patent. There is a stent extending from the midportion of the left mainstem into the proximal LAD. There is no significant stenosis throughout the stented segment.  Left Anterior Descending  The vessel  exhibits minimal luminal irregularities.  Left Circumflex  The vessel exhibits minimal luminal irregularities.  The lesion was previously treated using a drug eluting stent between 1-2 years ago. The left circumflex stent was previously 'crushed' as part of the distal left main intervention. The stent is widely patent with mild 25% in-stent  restenosis at the origin of the circumflex.  Right Coronary Artery  The vessel exhibits minimal luminal irregularities.  The lesion is moderately calcified. nonobstructive calcified mid-RCA stenosis     Patient Profile     79 y.o. female with PMH permanent atrial fibrillation, history of TIA, CAD with prior DES, hypertension, hyperlipidemia, dysphagia who is seen in consultation at the request of Dr. Tyrell Antonio for management of atrial fibrillation with RVR.  Assessment & Plan    Permanent atrial fibrillation, with RVR in the setting of strict NPO and not taking home medications -being managed by diltiazem drip, down to 5 mg/hr with fair rate control. Aiming for HR <110. Can be uptitrated as needed.  -on heparin for anticoagulation given history of TIA (bridging) -prior EF 45-50%. Tolerating diltiazem.  -main issue is NPO, as below. -once able to take orals, can then titrate back to home medications -does not appear volume overloaded on exam. Given strict NPO, would avoid diuresis unless needed. CXR today without vascular congestion.  Pill dysphagia -strict NPO currently -SLP in room -management per primary team  For questions or updates, please contact [place] Please consult [email or web address] for contact info under        Signed, Buford Dresser, MD  07/18/2020, 9:52 AM

## 2020-07-18 NOTE — Progress Notes (Signed)
Occupational Therapy Evaluation  Patient with functional deficits listed below impacting safety and independence with self care. Mod A for trunk elevation from bed, min A to stand pivot to bedside commode with patient anxious/ impulsive letting go of therapist's hand "I have to go now." Hand held assist x1 with min A for stability to take few steps from commode to recliner, patient would benefit from using walker. Patient reports her DTR lives 2 miles away and comes over regularly to provide transportation, assist with ADLs as needed however DTR currently has injury to her R knee with DTR expressing concern with being able to assist patient physically at home. At this time recommend SNF rehab to improve patient activity tolerance, strength, independence with mobility/functional transfers as she does live home alone, pending progress patient may be able to D/C [facility] (patient preference).     07/18/20 1500  OT Visit Information  Last OT Received On 07/18/20  Assistance Needed +1  History of Present Illness Patient is an 79 year old female with PMH including back sx, hiatal hernia, esophageal and oral pharyngeal dysphagia, permanent atrial fibrillation on calcium channel blocker and DOAC, anxiety presented to hospital after episode choking on pill. Patient initially seen at [facility] and negative x-rays and was reported feeling okay and discharged home 7/26. Since then unable to swallow and more coughing.  Also difficulty breathing with a change in her voice.  Precautions  Precautions Fall  Precaution Comments A fib with RVR  Restrictions  Weight Bearing Restrictions No  Home Living  Family/patient expects to be discharged to: Private residence  Living Arrangements Alone  Available Help at Discharge Family;Available PRN/intermittently  Type of Home House  Home Access Stairs to enter  Entrance Stairs-Number of Steps 1  Home Layout One level  Bathroom Shower/Tub Walk-in shower  Bathroom Toilet  Handicapped Pray - 4 wheels;Shower seat;Hand held shower head  Prior Function  Level of Independence Needs assistance  Gait / Transfers Assistance Needed ambulated short distances with rollator d/t chronic back pain  ADL's / [place] patient reports primarily independent with bathing, toileting, dressing except socks/shoes. DTR comes over regularly and will assist with LB dressing and bathing "it makes it easier on me"  Communication  Communication Other (comment) (voice sounds mildly hoarse, easily can understand)  Pain Assessment  Pain Assessment 0-10  Pain Score 6  Pain Location back  Pain Descriptors / Indicators Aching  Pain Intervention(s) Monitored during session  Cognition  Arousal/Alertness Awake/alert  Behavior During Therapy Anxious  Overall Cognitive Status Within Functional Limits for tasks assessed  General Comments with standing patient gets anxious "I have to pee" and lets go of therapist, cue for safety to complete transfer to commode  Upper Extremity Assessment  Upper Extremity Assessment Generalized weakness  Lower Extremity Assessment  Lower Extremity Assessment Defer to PT evaluation  ADL  Overall ADL's  Needs assistance/impaired  Eating/Feeding Set up;Sitting  Grooming Wash/dry hands;Set up;Sitting  Upper Body Bathing Set up;Sitting  Lower Body Bathing Moderate assistance;Sitting/lateral leans;Sit to/from stand  Upper Body Dressing  Set up;Sitting  Lower Body Dressing Total assistance;Sit to/from stand;Sitting/lateral leans  Lower Body Dressing Details (indicate cue type and reason) to don socks (patient needs assist with this at baseline, also reports doesn't typically wear socks)  Toilet Transfer Minimal assistance;Cueing for safety;Cueing for sequencing;Stand-pivot;BSC  Toilet Transfer Details (indicate cue type and reason) hand held assist to bedside commode, cues for safety as patient is anxious regarding  imminent need to urinate  Toileting- Water quality scientist and Hygiene Sitting/lateral lean;Set up  Toileting - Clothing Manipulation Details (indicate cue type and reason) to complete peri care seated  Functional mobility during ADLs Minimal assistance;Cueing for safety (hand held assist)  General ADL Comments patient requiring increased assistance with self care due to decreased activity tolerance, safety, balance  Bed Mobility  Overal bed mobility Needs Assistance  Bed Mobility Supine to Sit  Supine to sit HOB elevated;Mod assist  General bed mobility comments to elevate trunk to sitting, patient stating cannot scoot to EOB however with encouragement and cues for technique pt able  Transfers  Overall transfer level Needs assistance  Equipment used 1 person hand held assist  Transfers Sit to/from [employer] Transfers  Sit to Stand Min assist  Stand pivot transfers Min assist  General transfer comment cues for safety due to anxious/mildly impulsive, would benfit from using walker  Balance  Overall balance assessment Needs assistance  Sitting-balance support Feet supported  Sitting balance-Leahy Scale Good  Standing balance support Single extremity supported;During functional activity  Standing balance-Leahy Scale Fair  Standing balance comment patient able to take few steps with 1 hand held assist however would be safer with walker  General Comments  General comments (skin integrity, edema, etc.) HR ranging between 90-150 during session, even at rest HR fluctuating between 90-120, RN made aware and cleared for therapy by RN and MD  OT - End of Session  Activity Tolerance Patient tolerated treatment well  Patient left in chair;with call bell/phone within reach;with chair alarm set;with family/visitor present  Nurse Communication Mobility status  OT Assessment  OT Recommendation/Assessment Patient needs continued OT Services  OT Visit Diagnosis Other abnormalities of gait and  mobility (R26.89);History of falling (Z91.81);Pain  Pain - part of body  (back)  OT Problem List Decreased activity tolerance;Impaired balance (sitting and/or standing);Decreased safety awareness;Pain;Obesity  Barriers to Discharge Decreased caregiver support  Barriers to Discharge Comments patient's DTR that lives nearby has injury to knee/limited physical assistance  OT Plan  OT Frequency (ACUTE ONLY) Min 2X/week  OT Treatment/Interventions (ACUTE ONLY) Self-care/ADL training;Therapeutic exercise;Energy conservation;DME and/or AE instruction;Therapeutic activities;Patient/family education;Balance training  AM-PAC OT "6 Clicks" Daily Activity Outcome Measure (Version 2)  Help from another person eating meals? 4  Help from another person taking care of personal grooming? 3  Help from another person toileting, which includes using toliet, bedpan, or urinal? 3  Help from another person bathing (including washing, rinsing, drying)? 2  Help from another person to put on and taking off regular upper body clothing? 3  Help from another person to put on and taking off regular lower body clothing? 2  6 Click Score 17  OT Recommendation  Follow Up Recommendations SNF;Home health OT;Supervision/Assistance - 24 hour;Other (comment) (SNF vs HH pending progress (patient wants HH))  OT Equipment Other (comment) (patient reports needs new rollator the frame of hers is bent)  Individuals Consulted  Consulted and Agree with Results and Recommendations Patient  Acute Rehab OT Goals  Patient Stated Goal go home  OT Goal Formulation With patient  Time For Goal Achievement 08/01/20  Potential to Achieve Goals Good  OT Time Calculation  OT Start Time (ACUTE ONLY) 1322  OT Stop Time (ACUTE ONLY) 1402  OT Time Calculation (min) 40 min  OT General Charges  $OT Visit 1 Visit  OT Evaluation  $OT Eval Moderate Complexity 1 Mod  OT Treatments  $Self Care/Home Management  23-37 mins  Written Expression  Dominant Hand Right   Delbert Phenix OT OT office: [phone]

## 2020-07-18 NOTE — Progress Notes (Signed)
  Speech Language Pathology Treatment: Dysphagia  Patient Details Name: [name] [name] MRN: [id number] DOB: 1941/09/16 Today's Date: 07/18/2020 Time: 0925-0940 SLP Time Calculation (min) (ACUTE ONLY): 15 min  Assessment / Plan / Recommendation Clinical Impression  SLP provided education with pt/family regarding oropharyngeal and suspected primary esophageal dysphagia. Recommendation for regular barium swallow (esophagram) was discussed. SLP provided written behavioral and dietary strategies for management of esophageal dysmotility, but we will follow up after esophageal work up results are available for continued education and to determine if further work up is needed.   Behavioral and Dietary Strategies for Management of Esophageal Dysmotility 1. Take reflux medications 30+ minutes before other po intake, in the morning 2. Begin meals with warm beverage 3. Eat smaller more frequent meals 4. Eat slowly, taking small bites and sips 5. Alternate solids and liquids 6. Avoid foods/liquids that increase acid production 7. Sit upright during and for 30+ minutes after meals to facilitate esophageal clearing   HPI HPI: 79yo female admitted 07/16/20 with acute respiratory distress following choking on a potassium pill. PMH: hiatal hernia, oropharyngeal and esophageal dysphagia with esophageal stricture, IBS, anxiety, depression, chronic diastolic heart failure, CAD, PCI, AFIb, HTN, HLD, anemia, GERD. BaSw 2011 - dysmotility with tertiary contractions, delayed emptying.      SLP Plan  Continue with current plan of care       Recommendations  Diet recommendations: Other(comment) (full liquid) Medication Administration: Crushed with puree Supervision: Patient able to self feed Compensations: Slow rate;Small sips/bites Postural Changes and/or Swallow Maneuvers: Upright 30-60 min after meal;Seated upright 90 degrees                Oral Care Recommendations: Oral care QID Follow up  Recommendations: Other (comment) (TBD) SLP Visit Diagnosis: Dysphagia, unspecified (R13.10) Plan: Continue with current plan of care       [place], [facility], [place] Speech Language Pathologist Office: [phone] Pager: [phone]  Shonna Chock 07/18/2020, 9:57 AM

## 2020-07-18 NOTE — Evaluation (Signed)
Physical Therapy Evaluation Patient Details Name: [name] [name] MRN: [id number] DOB: 06/30/1941 Today's Date: 07/18/2020   History of Present Illness  Patient is an 79 year old female with PMH including back sx, hiatal hernia, esophageal and oral pharyngeal dysphagia, permanent atrial fibrillation on calcium channel blocker and DOAC, anxiety presented to hospital after episode choking on pill. Patient initially seen at [facility] and negative x-rays and was reported feeling okay and discharged home 7/26. Since then unable to swallow and more coughing.  Also difficulty breathing with a change in her voice.  Clinical Impression   Pt presents with generalized weakness, impaired balance, difficulty performing mobility tasks, and decreased activity tolerance vs baseline. Pt to benefit from acute PT to address deficits. Pt ambulated room distance x2 with use of RW and increased time, verbal cuing for form and safety. Pt with erratic HR, ranging from 80s-160s bpm. RN aware. PT recommending HHPT with 24/7 assist from pt's daughter, at least upon initial d/c from acute setting. PT to progress mobility as tolerated, and will continue to follow acutely.      Follow Up Recommendations Home health PT;Supervision/Assistance - 24 hour    Equipment Recommendations  Other (comment) (rollator; per pt report hers is broken)    Recommendations for Other Services       Precautions / Restrictions Precautions Precautions: Fall Precaution Comments: A fib with RVR Restrictions Weight Bearing Restrictions: No      Mobility  Bed Mobility Overal bed mobility: Needs Assistance Bed Mobility: Supine to Sit     Supine to sit: HOB elevated;Mod assist     General bed mobility comments: up in recliner  Transfers Overall transfer level: Needs assistance Equipment used: Rolling walker (2 wheeled) Transfers: Sit to/from Stand Sit to Stand: Min assist Stand pivot transfers: Min assist       General  transfer comment: Min assist for initial power up, steady. verbal cuing for correct hand placement when rising, not followed as pt states "I can't do it like that". STS x2, from recliner and toilet.  Ambulation/Gait Ambulation/Gait assistance: Min Gaffer (Feet): 50 Feet Assistive device: Rolling walker (2 wheeled) Gait Pattern/deviations: Step-through pattern;Decreased stride length;Trunk flexed Gait velocity: decr   General Gait Details: Min guard to supervision for safety, no physical assist. Verbal cuing for placement in RW, upright posture, and room navigation. Pt in afib, with HR 81-160s bpm  Stairs            Wheelchair Mobility    Modified Rankin (Stroke Patients Only)       Balance Overall balance assessment: Needs assistance Sitting-balance support: Feet supported Sitting balance-Leahy Scale: Good     Standing balance support: Single extremity supported;During functional activity Standing balance-Leahy Scale: Fair Standing balance comment: able to stand and take a couple of steps without RW, reaches for environment                             Pertinent Vitals/Pain Pain Assessment: 0-10 Pain Score: 6  Pain Location: back Pain Descriptors / Indicators: Aching;Grimacing;Sore Pain Intervention(s): Limited activity within patient's tolerance;Monitored during session;Repositioned    Home Living Family/patient expects to be discharged to:: Private residence Living Arrangements: Alone Available Help at Discharge: Family;Available PRN/intermittently Type of Home: House Home Access: Stairs to enter   Entrance Stairs-Number of Steps: 1 Home Layout: One level Home Equipment: Walker - 4 wheels;Shower seat;Hand held shower head      Prior Function  Level of Independence: Needs assistance   Gait / Transfers Assistance Needed: ambulated short distances with rollator d/t chronic back pain  ADL's / Homemaking Assistance Needed:  patient reports primarily independent with bathing, toileting, dressing except socks/shoes. DTR comes over regularly and will assist with LB dressing and bathing "it makes it easier on me"        Hand Dominance   Dominant Hand: Right    Extremity/Trunk Assessment   Upper Extremity Assessment Upper Extremity Assessment: Defer to OT evaluation    Lower Extremity Assessment Lower Extremity Assessment: Generalized weakness (full AROM knee flexion, hip abd/add, knee extension, DF/PF)    Cervical / Trunk Assessment Cervical / Trunk Assessment: Normal  Communication   Communication: Other (comment) (voice sounds mildly hoarse, easily can understand)  Cognition Arousal/Alertness: Awake/alert Behavior During Therapy: Anxious Overall Cognitive Status: Within Functional Limits for tasks assessed                                 General Comments: Pt is particular about how she performs mobility, declines PT cuing at times.      General Comments General comments (skin integrity, edema, etc.): HR ranging between 90-150 during session, even at rest HR fluctuating between 90-120, RN made aware and cleared for therapy by RN and MD    Exercises     Assessment/Plan    PT Assessment Patient needs continued PT services  PT Problem List Decreased strength;Decreased mobility;Decreased safety awareness;Decreased activity tolerance;Decreased balance;Decreased knowledge of use of DME;Pain;Cardiopulmonary status limiting activity       PT Treatment Interventions DME instruction;Therapeutic activities;Gait training;Therapeutic exercise;Patient/family education;Balance training;Functional mobility training;Neuromuscular re-education;Stair training    PT Goals (Current goals can be found in the Care Plan section)  Acute Rehab PT Goals Patient Stated Goal: go home PT Goal Formulation: With patient Time For Goal Achievement: 08/01/20 Potential to Achieve Goals: Good    Frequency  Min 3X/week   Barriers to discharge        Co-evaluation               AM-PAC PT "6 Clicks" Mobility  Outcome Measure Help needed turning from your back to your side while in a flat bed without using bedrails?: A Little Help needed moving from lying on your back to sitting on the side of a flat bed without using bedrails?: A Little Help needed moving to and from a bed to a chair (including a wheelchair)?: A Little Help needed standing up from a chair using your arms (e.g., wheelchair or bedside chair)?: A Little Help needed to walk in hospital room?: A Little Help needed climbing 3-5 steps with a railing? : A Little 6 Click Score: 18    End of Session   Activity Tolerance: Patient tolerated treatment well;Patient limited by fatigue Patient left: in chair;with chair alarm set;with call bell/phone within reach Nurse Communication: Mobility status;Other (comment) (tachycardia, in afib) PT Visit Diagnosis: Other abnormalities of gait and mobility (R26.89);Muscle weakness (generalized) (M62.81)    Time: 0086-7619 PT Time Calculation (min) (ACUTE ONLY): 22 min   Charges:   PT Evaluation $PT Eval Low Complexity: 1 Low         Shian Goodnow E, PT Acute Rehabilitation Services Pager [phone]  Office [phone]  Supriya Beaston D Elonda Husky 07/18/2020, 4:32 PM

## 2020-07-19 DIAGNOSIS — R1310 Dysphagia, unspecified: Secondary | ICD-10-CM

## 2020-07-19 LAB — CBC
HCT: 34.9 % — ABNORMAL LOW (ref 36.0–46.0)
Hemoglobin: 11.1 g/dL — ABNORMAL LOW (ref 12.0–15.0)
MCH: 30.5 pg (ref 26.0–34.0)
MCHC: 31.8 g/dL (ref 30.0–36.0)
MCV: 95.9 fL (ref 80.0–100.0)
Platelets: 169 10*3/uL (ref 150–400)
RBC: 3.64 MIL/uL — ABNORMAL LOW (ref 3.87–5.11)
RDW: 13.2 % (ref 11.5–15.5)
WBC: 5.2 10*3/uL (ref 4.0–10.5)
nRBC: 0 % (ref 0.0–0.2)

## 2020-07-19 LAB — APTT: aPTT: 76 seconds — ABNORMAL HIGH (ref 24–36)

## 2020-07-19 LAB — GLUCOSE, CAPILLARY: Glucose-Capillary: 153 mg/dL — ABNORMAL HIGH (ref 70–99)

## 2020-07-19 LAB — BASIC METABOLIC PANEL
Anion gap: 9 (ref 5–15)
BUN: 5 mg/dL — ABNORMAL LOW (ref 8–23)
CO2: 24 mmol/L (ref 22–32)
Calcium: 8.7 mg/dL — ABNORMAL LOW (ref 8.9–10.3)
Chloride: 105 mmol/L (ref 98–111)
Creatinine, Ser: 0.66 mg/dL (ref 0.44–1.00)
GFR calc Af Amer: >60 mL/min (ref >60)
GFR calc non Af Amer: >60 mL/min (ref >60)
Glucose, Bld: 106 mg/dL — ABNORMAL HIGH (ref 70–99)
Potassium: 3.2 mmol/L — ABNORMAL LOW (ref 3.5–5.1)
Sodium: 138 mmol/L (ref 135–145)

## 2020-07-19 LAB — HEPARIN LEVEL (UNFRACTIONATED): Heparin Unfractionated: 0.66 IU/mL (ref 0.30–0.70)

## 2020-07-19 MED ORDER — SIMETHICONE 80 MG PO CHEW
80.0000 mg | CHEWABLE_TABLET | Freq: Four times a day (QID) | ORAL | Status: DC | PRN
  Administered 2020-07-19: 80 mg via ORAL
  Filled 2020-07-19: qty 1

## 2020-07-19 MED ORDER — APIXABAN 5 MG PO TABS
5.0000 mg | ORAL_TABLET | Freq: Two times a day (BID) | ORAL | Status: DC
  Administered 2020-07-19 – 2020-07-20 (×3): 5 mg via ORAL
  Filled 2020-07-19 (×3): qty 1

## 2020-07-19 MED ORDER — RIFAXIMIN 550 MG PO TABS
550.0000 mg | ORAL_TABLET | Freq: Two times a day (BID) | ORAL | Status: DC
  Administered 2020-07-19 – 2020-07-20 (×3): 550 mg via ORAL
  Filled 2020-07-19 (×4): qty 1

## 2020-07-19 MED ORDER — DILTIAZEM HCL 60 MG PO TABS
60.0000 mg | ORAL_TABLET | Freq: Four times a day (QID) | ORAL | Status: DC
  Administered 2020-07-19 – 2020-07-20 (×5): 60 mg via ORAL
  Filled 2020-07-19 (×5): qty 1

## 2020-07-19 MED ORDER — SACCHAROMYCES BOULARDII 250 MG PO CAPS: 250.0000 mg | ORAL_CAPSULE | Freq: Two times a day (BID) | ORAL | Status: DC

## 2020-07-19 MED ORDER — HYDROCODONE-ACETAMINOPHEN 10-325 MG PO TABS: 1.0000 | ORAL_TABLET | ORAL | Status: DC | PRN

## 2020-07-19 MED ORDER — POTASSIUM CHLORIDE CRYS ER 20 MEQ PO TBCR
40.0000 meq | EXTENDED_RELEASE_TABLET | Freq: Once | ORAL | Status: AC
  Administered 2020-07-19: 40 meq via ORAL
  Filled 2020-07-19: qty 2

## 2020-07-19 MED ORDER — DILTIAZEM HCL ER COATED BEADS 120 MG PO CP24: 120.0000 mg | ORAL_CAPSULE | Freq: Two times a day (BID) | ORAL | Status: DC

## 2020-07-19 MED ORDER — ESZOPICLONE 1 MG PO TABS
3.0000 mg | ORAL_TABLET | Freq: Every evening | ORAL | Status: DC | PRN
  Administered 2020-07-19: 3 mg via ORAL
  Filled 2020-07-19 (×2): qty 3

## 2020-07-19 MED ORDER — PANTOPRAZOLE SODIUM 40 MG PO PACK
40.0000 mg | PACK | Freq: Two times a day (BID) | ORAL | Status: DC
  Administered 2020-07-19 – 2020-07-20 (×2): 40 mg via ORAL
  Filled 2020-07-19 (×4): qty 20

## 2020-07-19 NOTE — Progress Notes (Signed)
Physical Therapy Treatment Patient Details Name: [name] [name] MRN: [id number] DOB: 31-May-1941 Today's Date: 07/19/2020    History of Present Illness Patient is an 79 year old female with PMH including back sx, hiatal hernia, esophageal and oral pharyngeal dysphagia, permanent atrial fibrillation on calcium channel blocker and DOAC, anxiety presented to hospital after episode choking on pill. Patient initially seen at [facility] and negative x-rays and was reported feeling okay and discharged home 7/26. Since then unable to swallow and more coughing.  Also difficulty breathing with a change in her voice.    PT Comments    Emphasis mostly on transfer safety education /demo, but also on gait stability and stamina with the RW.    Follow Up Recommendations  Home health PT;Supervision/Assistance - 24 hour     Equipment Recommendations  Other (comment)    Recommendations for Other Services       Precautions / Restrictions Precautions Precautions: Fall Precaution Comments: A fib with RVR Restrictions Weight Bearing Restrictions: No    Mobility  Bed Mobility               General bed mobility comments: up at EOB on arrival  Transfers Overall transfer level: Needs assistance   Transfers: Sit to/from Stand Sit to Stand: Min guard         General transfer comment: discussed transfer safety and why she should NOT pull up on the RW or Plop down without reaching back.  Ambulation/Gait Ambulation/Gait assistance: Min guard Gait Distance (Feet): 180 Feet Assistive device: Rolling walker (2 wheeled) Gait Pattern/deviations: Step-through pattern Gait velocity: decr Gait velocity interpretation: 1.31 - 2.62 ft/sec, indicative of limited community ambulator General Gait Details: generally steady with the RW.  Reporting that "sciatica" getting to her R hip..  Sat WFL, HR in the 120's and 130's.   Stairs             Wheelchair Mobility    Modified Rankin (Stroke  Patients Only)       Balance Overall balance assessment: Needs assistance Sitting-balance support: Feet supported Sitting balance-Leahy Scale: Good       Standing balance-Leahy Scale: Fair                              Cognition Arousal/Alertness: Awake/alert Behavior During Therapy: Anxious;WFL for tasks assessed/performed Overall Cognitive Status: Within Functional Limits for tasks assessed                                        Exercises      General Comments        Pertinent Vitals/Pain Pain Assessment: Faces Pain Score: 7  Faces Pain Scale: Hurts little more Pain Location: back Pain Descriptors / Indicators: Aching Pain Intervention(s): Monitored during session    Home Living                      Prior Function            PT Goals (current goals can now be found in the care plan section) Acute Rehab PT Goals Patient Stated Goal: go home PT Goal Formulation: With patient Time For Goal Achievement: 08/01/20 Potential to Achieve Goals: Good Progress towards PT goals: Progressing toward goals    Frequency    Min 3X/week      PT Plan Current plan  remains appropriate    Co-evaluation              AM-PAC PT "6 Clicks" Mobility   Outcome Measure  Help needed turning from your back to your side while in a flat bed without using bedrails?: A Little Help needed moving from lying on your back to sitting on the side of a flat bed without using bedrails?: A Little Help needed moving to and from a bed to a chair (including a wheelchair)?: A Little Help needed standing up from a chair using your arms (e.g., wheelchair or bedside chair)?: A Little Help needed to walk in hospital room?: A Little Help needed climbing 3-5 steps with a railing? : A Little 6 Click Score: 18    End of Session   Activity Tolerance: Patient tolerated treatment well;Patient limited by fatigue Patient left: in chair;with chair alarm  set;with call bell/phone within reach Nurse Communication: Mobility status;Other (comment) PT Visit Diagnosis: Other abnormalities of gait and mobility (R26.89);Muscle weakness (generalized) (M62.81)     Time: 1545-1610 PT Time Calculation (min) (ACUTE ONLY): 25 min  Charges:  $Gait Training: 8-22 mins $Therapeutic Activity: 8-22 mins                     07/19/2020  Ginger Carne., PT Acute Rehabilitation Services [phone]  (pager) [phone]  (office)   Tessie Fass Nikolus Marczak 07/19/2020, 5:00 PM

## 2020-07-19 NOTE — Progress Notes (Signed)
  Speech Language Pathology Treatment: Dysphagia  Patient Details Name: [name] [name] MRN: [id number] DOB: 01-06-1941 Today's Date: 07/19/2020 Time: 3086-5784 SLP Time Calculation (min) (ACUTE ONLY): 10 min  Assessment / Plan / Recommendation Clinical Impression  Pt was seen for dysphagia treatment and was cooperative during the session. Pt reported that she believes her swallow function is improved and that she has been tolerating the current diet well. Pt tolerated regular texture solids and thin liquids via straw using consecutive swallows without symptoms of oropharyngeal dysphagia. It is recommended that her diet be advanced to regular texture solids and thin liquids. SLP will follow briefly to assess diet tolerance.    HPI HPI: 79yo female admitted 07/16/20 with acute respiratory distress following choking on a potassium pill. PMH: hiatal hernia, oropharyngeal and esophageal dysphagia with esophageal stricture, IBS, anxiety, depression, chronic diastolic heart failure, CAD, PCI, AFIb, HTN, HLD, anemia, GERD. BaSw 2011 - dysmotility with tertiary contractions, delayed emptying.      SLP Plan  Continue with current plan of care       Recommendations  Diet recommendations: Regular;Thin liquid Liquids provided via: Cup;Straw Medication Administration: Crushed with puree Supervision: Patient able to self feed Compensations: Slow rate;Small sips/bites;Follow solids with liquid Postural Changes and/or Swallow Maneuvers: Upright 30-60 min after meal;Seated upright 90 degrees                Oral Care Recommendations: Oral care BID Follow up Recommendations: None SLP Visit Diagnosis: Dysphagia, unspecified (R13.10) Plan: Continue with current plan of care       Lamine Laton I. Hardin Negus, [place], [place] Office number [phone] Pager [place] 07/19/2020, 4:42 PM

## 2020-07-19 NOTE — Progress Notes (Signed)
Progress Note  Patient Name: [name] [name] Date of Encounter: 07/19/2020  [facility] Cardiologist: Sherren Mocha, MD   Subjective   Denies any discomfort, afraid last night to begin to try oral medications.   Inpatient Medications    Scheduled Meds: . albuterol  2.5 mg Nebulization TID  . Chlorhexidine Gluconate Cloth  6 each Topical Daily  . fentaNYL  1 patch Transdermal Q72H  . fluticasone  1 spray Each Nare Daily  . guaiFENesin  600 mg Oral BID  . melatonin  3 mg Oral QHS  . pantoprazole (PROTONIX) IV  40 mg Intravenous Q12H  . sodium chloride  2 spray Each Nare BID   Continuous Infusions: . cefTRIAXone (ROCEPHIN)  IV 1 g (07/18/20 2012)  . diltiazem (CARDIZEM) infusion 5 mg/hr (07/18/20 1100)  . heparin 1,150 Units/hr (07/18/20 1100)   PRN Meds: albuterol   Vital Signs    Vitals:   07/19/20 0111 07/19/20 0543 07/19/20 0544 07/19/20 0750  BP: (!) 110/88  (!) 136/89   Pulse: 96  97   Resp: 17  19   Temp: 98.6 F (37 C)  98.4 F (36.9 C)   TempSrc: Oral  Oral   SpO2: 96%  95% 94%  Weight:  82.9 kg    Height:        Intake/Output Summary (Last 24 hours) at 07/19/2020 0751 Last data filed at 07/19/2020 0300 Gross per 24 hour  Intake 590.55 ml  Output 300 ml  Net 290.55 ml   Last 3 Weights 07/19/2020 07/17/2020 07/16/2020  Weight (lbs) 182 lb 11.2 oz 175 lb 14.8 oz 175 lb 14.8 oz  Weight (kg) 82.872 kg 79.8 kg 79.8 kg      Telemetry    Atrial fibrillation with HR 90s, occasional peak into the 100s range - Personally Reviewed  ECG    Atrial fibrillation with RVR - Personally Reviewed  Physical Exam   GEN: No acute distress.   Neck: No JVD Cardiac: irregularly irregular, no murmurs, rubs, or gallops.  Respiratory: Clear to auscultation bilaterally. GI: Soft, nontender, non-distended  MS: No edema; No deformity. Neuro:  Nonfocal  Psych: Normal affect   Labs    High Sensitivity Troponin:  No results for input(s): TROPONINIHS in the last  720 hours.    Chemistry Recent Labs  Lab 07/17/20 0118 07/18/20 0500 07/19/20 0639  NA 139 138 138  K 3.6 3.5 3.2*  CL 103 108 105  CO2 26 17* 24  GLUCOSE 114* 79 106*  BUN 8 7* 5*  CREATININE 0.74 0.70 0.66  CALCIUM 8.7* 8.4* 8.7*  GFRNONAA >60 >60 >60  GFRAA >60 >60 >60  ANIONGAP 10 13 9      Hematology Recent Labs  Lab 07/17/20 0118 07/18/20 0314 07/19/20 0639  WBC 7.6 6.4 5.2  RBC 3.92 4.06 3.64*  HGB 11.8* 12.6 11.1*  HCT 37.6 40.0 34.9*  MCV 95.9 98.5 95.9  MCH 30.1 31.0 30.5  MCHC 31.4 31.5 31.8  RDW 13.4 13.5 13.2  PLT 193 170 169    BNPNo results for input(s): BNP, PROBNP in the last 168 hours.   DDimer No results for input(s): DDIMER in the last 168 hours.   Radiology    CT ABDOMEN PELVIS W CONTRAST  Result Date: 07/18/2020 CLINICAL DATA:  Left side pain EXAM: CT ABDOMEN AND PELVIS WITH CONTRAST TECHNIQUE: Multidetector CT imaging of the abdomen and pelvis was performed using the standard protocol following bolus administration of intravenous contrast. CONTRAST:  184mL OMNIPAQUE  IOHEXOL 300 MG/ML  SOLN COMPARISON:  10/31/2010 FINDINGS: Lower chest: Large hiatal hernia. Left base atelectasis. No acute abnormality. Hepatobiliary: No focal liver abnormality is seen. Status post cholecystectomy. No biliary dilatation. Pancreas: No focal abnormality or ductal dilatation. Spleen: No focal abnormality.  Normal size. Adrenals/Urinary Tract: Punctate nonobstructing stone in the midpole of the right kidney. Scarring in the midpole of the right kidney. No ureteral stones or hydronephrosis. Adrenal glands unremarkable. Urinary bladder is decompressed, but bladder wall appears thickened. Stomach/Bowel: Scattered colonic diverticulosis. No active diverticulitis. Stomach, large and small bowel grossly unremarkable. Vascular/Lymphatic: Aortic atherosclerosis. No evidence of aneurysm or adenopathy. Reproductive: Uterus and adnexa unremarkable.  No mass. Other: No free fluid or  free air. Musculoskeletal: No acute bony abnormality. Postoperative changes in the spine. IMPRESSION: Bladder wall appears thickened although decompressed and difficult to evaluate. Cannot exclude cystitis. Punctate nonobstructing right midpole renal stone. Aortic atherosclerosis. Hiatal hernia. Electronically Signed   By: Rolm Baptise M.D.   On: 07/18/2020 12:14   DG CHEST PORT 1 VIEW  Result Date: 07/18/2020 CLINICAL DATA:  Cough, shortness of breath, low LEFT anterior chest pain, history hypertension, coronary artery disease, atrial fibrillation post cardioversion, CHF EXAM: PORTABLE CHEST 1 VIEW COMPARISON:  Portable exam 0844 hours compared to 07/16/2020 FINDINGS: Enlargement of cardiac silhouette. Pulmonary vascularity normal. Eventration of RIGHT diaphragm again seen. Large hiatal hernia. Atelectasis versus consolidation at LEFT base. Remaining lungs clear. No pleural effusion or pneumothorax. Bones demineralized. Extensive orthopedic hardware of thoracolumbar spine. IMPRESSION: Large hiatal hernia. Atelectasis versus consolidation LEFT lower lobe. Electronically Signed   By: Lavonia Dana M.D.   On: 07/18/2020 08:53   DG ESOPHAGUS W SINGLE CM (SOL OR THIN BA)  Result Date: 07/18/2020 CLINICAL DATA:  Dysphagia with coughing and choking. Globus sensation. Recent pill stuck in throat. EXAM: ESOPHOGRAM/BARIUM SWALLOW TECHNIQUE: Single contrast examination was performed using  thin barium. FLUOROSCOPY TIME:  Fluoroscopy Time:  2 minutes 42 seconds Radiation Exposure Index (if provided by the fluoroscopic device): 21.2 mGy Number of Acquired Spot Images: 0 COMPARISON:  None. FINDINGS: Exam was suboptimal due to patient's inability to stand and perform bolus swallowing. The patient was only able to drink a small amount of thin barium through a straw in LPO position. This shows no evidence of esophageal mass. A persistent corkscrew appearance of the distal thoracic esophagus is seen, consistent with esophageal  spasm. Complete absence of normal peristalsis was demonstrated, which causes esophageal stasis. Barium did subsequently pass into the stomach, and demonstrated a moderate to large sliding hiatal hernia. IMPRESSION: Technically suboptimal exam. Corkscrew appearance of lower thoracic esophagus, consistent with esophageal spasm. Moderate to large sliding hiatal hernia. Electronically Signed   By: Marlaine Hind M.D.   On: 07/18/2020 12:51    Cardiac Studies   Echo 04/15/2020 1. Difficult acoustic windows. Atrial fibrillation makes accurate  assessment of LVEF difficult. LVEF is probab mildly decreased, does not  appear to be significantly differnt than previous echo images . Left  ventricular ejection fraction, by estimation,  is 45 to 50%. The left ventricle has mildly decreased function. The left  ventricle has no regional wall motion abnormalities. There is mild left  ventricular hypertrophy. Left ventricular diastolic parameters are  indeterminate.  2. Right ventricular systolic function is mildly reduced. The right  ventricular size is normal. There is mildly elevated pulmonary artery  systolic pressure.  3. Left atrial size was mildly dilated.  4. The mitral valve is normal in structure. Mild mitral valve  regurgitation.  5. Tricuspid valve regurgitation is mild to moderate.  6. The aortic valve is normal in structure. Aortic valve regurgitation is  trivial.   Patient Profile     79 y.o. female with PMH of permanent atrial fibrillation, TIA, CAD, HTN, HLD and pharyngeal dysphagia presented after developing increased work of breathing swallowing a potassium pill.   Assessment & Plan    1. Permanent atrial fibrillation: home Eliquis switched to IV heparin. Home diltiazem CD 120mg  BID held, currently HR controlled on IV diltiazem.   - Per Dr. Tyrell Antonio, plan to switch oral medication this morning. Speech therapist recommended crushed oral meds with puree.   - discussed with clinical  pharmacist, Eliquis is ok to be crushed and given with puree, however long acting diltiazem cannot. Will switch her to short acting diltiazem 60mg  q6HR for now given with puree crushed. If she is able to tolerate, may consider reintroduce long acting diltiazem in the future.   2. Pill dysphagia: h/o moderate to large sliding hiatal hernia. Presence of esophageal spasm on image.   3. CAD: patent stents on last cath 11/25/2016  4. H/o TIA  5. HTN  6. HLD   For questions or updates, please contact [place] Please consult [email or web address] for contact info under        Signed, Almyra Deforest, [place]  07/19/2020, 7:51 AM

## 2020-07-19 NOTE — TOC Initial Note (Signed)
Transition of Care [facility]) - Initial/Assessment Note    Patient Details  Name: [name] [name] MRN: [id number] Date of Birth: Jul 04, 1941  Transition of Care [facility]) CM/SW Contact:    Bethena Roys, RN Phone Number: 07/19/2020, 4:12 PM  Clinical Narrative: Patient presented for acute respiratory distress. Prior to arrival patient was from home alone with support of daughter. [place] Manager discussed with patient and daughter regarding discharge planning needs. Patient has declined SNF and wants to return home with home health services. Patient has used [facility] in the past; [place] Manager called agency and they cannot accept due to staffing. [facility] is in network with [place]- Medicare and can accept the patient for services. Start of care will begin next week.  Patient has Durable medical equipment rolling walker, rollator, and shower chair. [place] Manager will continue to monitor for additional transition of care needs.                 Expected Discharge Plan: [place] Barriers to Discharge: No Barriers Identified   Patient Goals and CMS Choice Patient states their goals for this hospitalization and ongoing recovery are:: patient declined SNF and goal is to return home. CMS Medicare.gov Compare Post Acute Care list provided to:: Patient Choice offered to / list presented to : Patient  Expected Discharge Plan and Services Expected Discharge Plan: [place]   Discharge Planning Services: CM Consult Post Acute Care Choice: [place] arrangements for the past 2 months: Single Family Home                 DME Arranged: N/A         HH Arranged: PT HH Agency: [facility] Date [place]: 07/19/20 Time HH Agency Contacted: 1500 Representative spoke with at [facility]: Malachy Mood  Prior Living Arrangements/Services Living arrangements for the past 2 months: [place] with:: Self (has  support of daughter.) Patient language and need for interpreter reviewed:: Yes Do you feel safe going back to the place where you live?: Yes      Need for Family Participation in Patient Care: Yes (Comment) Care giver support system in place?: Yes (comment) Current home services:  (Patient has RW, RW with seat and [place].) Criminal Activity/Legal Involvement Pertinent to Current Situation/Hospitalization: No - Comment as needed  Activities of Daily Living      Permission Sought/Granted Permission sought to share information with : Family Supports, Customer service manager, [place] Optician, dispensing granted to share information with : Yes, Verbal Permission Granted     Permission granted to share info w AGENCY: [facility]        Emotional Assessment Appearance:: Appears stated age Attitude/Demeanor/Rapport: Engaged Affect (typically observed): Appropriate Orientation: : Oriented to Situation, Oriented to  Time, Oriented to Place, Oriented to Self Alcohol / Substance Use: Not Applicable Psych Involvement: No (comment)  Admission diagnosis:  Acute respiratory distress [R06.03] Atrial fibrillation with rapid ventricular response ([place]) [I48.91] Choking, initial encounter [T17.308A] Patient Active Problem List   Diagnosis Date Noted  . Choking   . Acute respiratory distress 07/16/2020  . Dysphagia 03/25/2020  . HOH (hard of hearing) 03/25/2020  . Lichen sclerosus et atrophicus of the vulva 01/03/2019  . Chronic pain syndrome 02/14/2018  . Long-term current use of opiate analgesic 02/14/2018  . Anxiety 07/21/2016  . Chronic diastolic CHF (congestive heart failure) ([place]) 07/17/2015  . DDD (degenerative disc disease), lumbar 03/22/2015  . Fall  03/22/2015  . 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 Difficulty hearing 03/22/2015  . Lumbar scoliosis 03/22/2015  . History of pelvic fracture 03/22/2015  . History of other specified conditions presenting hazards to health 03/22/2015  . MGUS  (monoclonal gammopathy of unknown significance) 01/08/2015  . Permanent atrial fibrillation ([place]) 12/22/2014  . CAD (coronary artery disease) 12/22/2014  . Memory disorder 12/04/2014  . Angulation of spine 09/18/2014  . Metabolic syndrome 32/95/1884  . S/P spinal fusion 02/23/2014  . Chronic pain associated with significant psychosocial dysfunction 02/23/2014  . Gastroesophageal hernia 07/06/2013  . Incontinence 10/13/2012  . 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 DEGENERATIVE JOINT DISEASE 04/08/2010  . Obesity (BMI 30.0-34.9) 04/04/2009  . CIRCADIAN RHYTHM SLEEP D/O DELAY SLEEP PHSE TYPE 04/04/2009  . CARPAL TUNNEL SYNDROME 04/04/2009  . IRRITABLE BOWEL SYNDROME 11/13/2008  . ESOPHAGEAL STRICTURE 11/12/2008  . GERD 11/12/2008  . Paralysis of diaphragm 11/12/2008  . DIVERTICULOSIS, COLON 11/12/2008  . Obstructive sleep apnea 05/23/2008  . Hyperlipidemia 05/22/2008  . Depression 05/22/2008  . Essential hypertension 05/22/2008  . INSOMNIA 05/22/2008   PCP:  [name], DO Pharmacy:   [id number] [place], [address] [place] [address] Phone: [phone] Fax: [phone]  [facility] [place] [place] [facility] [address] [place] [address] Phone: [phone] Fax: [phone]   Readmission Risk Interventions No flowsheet data found.

## 2020-07-19 NOTE — Progress Notes (Signed)
[place] for Heparin>>Eliquis Indication: atrial fibrillation  Allergies  Allergen Reactions  . Buprenorphine Hcl Itching  . Cymbalta [Duloxetine Hcl] Other (See Comments)    Other reaction(s): Other (See Comments) Personality changes - crying  2014  . Morphine And Related Itching  . Oxycodone-Acetaminophen Other (See Comments)    Personality changes   . Valium [Diazepam] Other (See Comments)    Personality changes - "in another world" ;  Hallucinations  2015  . Statins Other (See Comments)    Other reaction(s): Other (See Comments) Muscle weakness Muscle weakness  . Altace [Ramipril] Other (See Comments)    unknown  . Floxin [Ofloxacin] Other (See Comments)    unknown  . Hydromorphone Other (See Comments)    Other reaction(s): Confusion (intolerance) unknown  . Lovaza [Omega-3-Acid Ethyl Esters] Other (See Comments)    Muscle weakness   . Trilipix [Choline Fenofibrate] Other (See Comments)    Muscle weakness   . Zetia [Ezetimibe] Other (See Comments)    Muscle weakness     Patient Measurements: Height: 5\' 7"  (170.2 cm) Weight: 82.9 kg (182 lb 11.2 oz) IBW/kg (Calculated) : 61.6  Vital Signs: Temp: 98.3 F (36.8 C) (07/30 0810) Temp Source: Oral (07/30 0810) BP: 103/76 (07/30 0810) Pulse Rate: 78 (07/30 0810)  Labs: Recent Labs    07/17/20 0118 07/17/20 0118 07/17/20 1850 07/17/20 1850 07/18/20 0314 07/18/20 0500 07/18/20 1452 07/19/20 0639  HGB 11.8*   < >  --   --  12.6  --   --  11.1*  HCT 37.6  --   --   --  40.0  --   --  34.9*  PLT 193  --   --   --  170  --   --  169  APTT  --   --  38*   < >  --  98* 71* 76*  HEPARINUNFRC  --   --  1.09*  --   --  1.14*  --  0.66  CREATININE 0.74  --   --   --   --  0.70  --  0.66   < > = values in this interval not displayed.    Estimated Creatinine Clearance: 62.1 mL/min (by C-G formula based on SCr of 0.66 mg/dL).  Assessment: 79 y.o. female with history of Afib.  Has been on heparin while PTA Eliquis on hold. Pharmacy has been consulted to transition from heparin to Eliquis today.  Goal of Therapy:  Monitor platelets by anticoagulation protocol: Yes   Plan:  Start Eliquis 5 mg BID  Stop heparin  Will sign off consult and pharmacy will continue to monitor peripherally  Vertis Kelch, PharmD, BCPS Phone [phone] 07/19/2020       9:10 AM  Please check [email or web address] for unit-specific pharmacist phone numbers

## 2020-07-19 NOTE — Progress Notes (Signed)
PHARMACIST - PHYSICIAN COMMUNICATION  DR:   Tyrell Antonio  CONCERNING: IV to Oral Route Change Policy  RECOMMENDATION: This patient is receiving pantoprazole by the intravenous route.  Based on criteria approved by the Pharmacy and Therapeutics Committee, the intravenous medication(s) is/are being converted to the equivalent oral dose form(s). Will order oral suspension since tablets cannot be crushed.  DESCRIPTION: These criteria include:  The patient is eating (either orally or via tube) and/or has been taking other orally administered medications for a least 24 hours  The patient has no evidence of active gastrointestinal bleeding or impaired GI absorption (gastrectomy, short bowel, patient on TNA or NPO).  If you have questions about this conversion, please contact the Pharmacy Department  []   [phone] )  [facility] []   [phone] )  [facility] [place] [facility] [x]   [phone] )  [facility] []   [phone] )  [facility] []   [phone] )  [place], PharmD PGY-1 Pharmacy Resident 07/19/2020 2:07 PM  Please check [email or web address] for unit-specific pharmacy phone numbers.

## 2020-07-19 NOTE — Progress Notes (Signed)
PROGRESS NOTE    [name] [name]  WNU:[id number] DOB: 1941-08-20 DOA: 07/16/2020 PCP: [name], DO   Brief Narrative: 79 year old with known past medical history of hiatal hernia, esophageal and oral pharyngeal dysphagia.  On 7/26 she had an episode where she choked on potassium pills.  She developed progressive increased work of breathing, copious upper airway secretion and worsening turbulent upper airway noises, initial soft tissue neck imaging without evidence of airway obstruction.  Critical care admitted patient due to worsening shortness of breath and new A. fib with RVR.  7/26 choking event on potassium pill, seen, imaging all without evidence of upper airway obstruction or aspiration discharge to home. 7/27 patient present back to the ED continue to complain of sore throat unable to breath and cough.  Copious oral secretions developed rapid ventricular response on telemetry   Assessment & Plan:   Active Problems:   Acute respiratory distress   Choking  1-Dysphagia; Remained stable overnight CT soft tissue neck and chest showed tonsil calcification, fishbone cannot be ruled out ENT consulted: underwent flexible fiberoptic laryngoscopy; examination unremarkable.  Esophagogram; showed esophageal spasm, moderate to large sliding hiatal hernia.  Continue with PPI twice daily. Full liquid diet. Awaiting speech follow up.   2-Hiatal hernia and oropharyngeal esophageal dysphagia Speech therapy consulted Esophagogram; showed esophageal spasm, moderate to large sliding hiatal hernia.  Left side upper quadrant abdominal pain. CT abdomen pelvis: Bladder wall appears thickened although decompressed and difficult to evaluate. Cannot exclude cystitis. Punctate nonobstructing right midpole renal stone. Hiatal hernia.  Tolerating diet, no nausea or vomiting.   3-A fib RVR : Continue with Cardizem drip.  Holding Eliquis. Cardiology consulted.  On heparin gtt. Plan to transition  to eliquis.  Transition to oral Cardizem short acting, that ca be crush.   4-respiratory distress after choking on p.o.:Cough Improved/ Continue with flonase, nebulizer.   5-Upper airway cough syndrome: Postnasal drip. Flonase added by pulmonologist.  6-UTI; report dysuria. UA with too numerous to count.  Started ceftriaxone.   7-Abdominal pain, diarrhea; one episode today, chronic. IBS.  Resume xifaxan.   history of diastolic heart failure: Holding diuretics. Resume tomorrow.   Estimated body mass index is 28.61 kg/m as calculated from the following:   Height as of this encounter: 5\' 7"  (1.702 m).   Weight as of this encounter: 82.9 kg.   DVT prophylaxis: SCD Code Status: Full code Family Communication: Care discussed with patient and daughter who was at bedside.  Disposition Plan:  Status is: Inpatient  Remains inpatient appropriate because:Hemodynamically unstable   Dispo: The patient is from: Home              Anticipated d/c is to: Home              Anticipated d/c date is: 2 days              Patient currently is not medically stable to d/c.        Consultants:   Cardiology  ENT  Procedures:     Antimicrobials:    Subjective: She is not feeling well, report diarrhea large BM watery. Report desire to have a bowel movement but cant.  Mild abdominal pain.  Cough better   Objective: Vitals:   07/19/20 0750 07/19/20 0810 07/19/20 1116 07/19/20 1459  BP:  103/76 120/84   Pulse:  78 77   Resp:  20 20   Temp:  98.3 F (36.8 C) 98.3 F (36.8 C)   TempSrc:  Oral Oral   SpO2: 94% 95% 98% 97%  Weight:      Height:        Intake/Output Summary (Last 24 hours) at 07/19/2020 1502 Last data filed at 07/19/2020 0300 Gross per 24 hour  Intake 363.98 ml  Output --  Net 363.98 ml   Filed Weights   07/16/20 1956 07/17/20 0500 07/19/20 0543  Weight: 79.8 kg 79.8 kg 82.9 kg    Examination:  General exam: NAD Respiratory system:  CTA Cardiovascular system: S 1, S 2 IRR Gastrointestinal system; BS present, soft, nt Central nervous system: Alert Extremities: No edema Skin: no rashes   Data Reviewed: I have personally reviewed following labs and imaging studies  CBC: Recent Labs  Lab 07/16/20 1050 07/17/20 0118 07/18/20 0314 07/19/20 0639  WBC 10.6* 7.6 6.4 5.2  HGB 13.1 11.8* 12.6 11.1*  HCT 42.0 37.6 40.0 34.9*  MCV 98.1 95.9 98.5 95.9  PLT 249 193 170 099   Basic Metabolic Panel: Recent Labs  Lab 07/16/20 1050 07/17/20 0118 07/18/20 0500 07/19/20 0639  NA 139 139 138 138  K 3.7 3.6 3.5 3.2*  CL 99 103 108 105  CO2 26 26 17* 24  GLUCOSE 132* 114* 79 106*  BUN 10 8 7* 5*  CREATININE 0.89 0.74 0.70 0.66  CALCIUM 9.6 8.7* 8.4* 8.7*  MG  --  2.1  --   --    GFR: Estimated Creatinine Clearance: 62.1 mL/min (by C-G formula based on SCr of 0.66 mg/dL). Liver Function Tests: No results for input(s): AST, ALT, ALKPHOS, BILITOT, PROT, ALBUMIN in the last 168 hours. No results for input(s): LIPASE, AMYLASE in the last 168 hours. No results for input(s): AMMONIA in the last 168 hours. Coagulation Profile: No results for input(s): INR, PROTIME in the last 168 hours. Cardiac Enzymes: No results for input(s): CKTOTAL, CKMB, CKMBINDEX, TROPONINI in the last 168 hours. BNP (last 3 results) No results for input(s): PROBNP in the last 8760 hours. HbA1C: No results for input(s): HGBA1C in the last 72 hours. CBG: Recent Labs  Lab 07/17/20 2000 07/17/20 2341 07/18/20 0338 07/18/20 0828 07/18/20 0926  GLUCAP 77 70 73 68* 119*   Lipid Profile: No results for input(s): CHOL, HDL, LDLCALC, TRIG, CHOLHDL, LDLDIRECT in the last 72 hours. Thyroid Function Tests: No results for input(s): TSH, T4TOTAL, FREET4, T3FREE, THYROIDAB in the last 72 hours. Anemia Panel: No results for input(s): VITAMINB12, FOLATE, FERRITIN, TIBC, IRON, RETICCTPCT in the last 72 hours. Sepsis Labs: No results for input(s):  PROCALCITON, LATICACIDVEN in the last 168 hours.  Recent Results (from the past 240 hour(s))  SARS Coronavirus 2 by RT PCR (hospital order, performed in [facility] hospital lab) Nasopharyngeal Nasopharyngeal Swab     Status: None   Collection Time: 07/16/20 12:01 PM   Specimen: Nasopharyngeal Swab  Result Value Ref Range Status   SARS Coronavirus 2 NEGATIVE NEGATIVE Final    Comment: (NOTE) SARS-CoV-2 target nucleic acids are NOT DETECTED.  The SARS-CoV-2 RNA is generally detectable in upper and lower respiratory specimens during the acute phase of infection. The lowest concentration of SARS-CoV-2 viral copies this assay can detect is 250 copies / mL. A negative result does not preclude SARS-CoV-2 infection and should not be used as the sole basis for treatment or other patient management decisions.  A negative result may occur with improper specimen collection / handling, submission of specimen other than nasopharyngeal swab, presence of viral mutation(s) within the areas targeted by this assay, and  inadequate number of viral copies (<250 copies / mL). A negative result must be combined with clinical observations, patient history, and epidemiological information.  Fact Sheet for Patients:   StrictlyIdeas.no  Fact Sheet for Healthcare Providers: BankingDealers.co.za  This test is not yet approved or  cleared by the [place] FDA and has been authorized for detection and/or diagnosis of SARS-CoV-2 by FDA under an Emergency Use Authorization (EUA).  This EUA will remain in effect (meaning this test can be used) for the duration of the COVID-19 declaration under Section 564(b)(1) of the Act, 21 U.S.C. section 360bbb-3(b)(1), unless the authorization is terminated or revoked sooner.  Performed at [facility], [place] [address], [place], [place] [address]   MRSA PCR Screening     Status: None   Collection Time: 07/16/20  9:00  PM   Specimen: Nasal Mucosa; Nasopharyngeal  Result Value Ref Range Status   MRSA by PCR NEGATIVE NEGATIVE Final    Comment:        The GeneXpert MRSA Assay (FDA approved for NASAL specimens only), is one component of a comprehensive MRSA colonization surveillance program. It is not intended to diagnose MRSA infection nor to guide or monitor treatment for MRSA infections. Performed at [facility], [place] [address] [place], [place] [address]          Radiology Studies: CT ABDOMEN PELVIS W CONTRAST  Result Date: 07/18/2020 CLINICAL DATA:  Left side pain EXAM: CT ABDOMEN AND PELVIS WITH CONTRAST TECHNIQUE: Multidetector CT imaging of the abdomen and pelvis was performed using the standard protocol following bolus administration of intravenous contrast. CONTRAST:  185mL OMNIPAQUE IOHEXOL 300 MG/ML  SOLN COMPARISON:  10/31/2010 FINDINGS: Lower chest: Large hiatal hernia. Left base atelectasis. No acute abnormality. Hepatobiliary: No focal liver abnormality is seen. Status post cholecystectomy. No biliary dilatation. Pancreas: No focal abnormality or ductal dilatation. Spleen: No focal abnormality.  Normal size. Adrenals/Urinary Tract: Punctate nonobstructing stone in the midpole of the right kidney. Scarring in the midpole of the right kidney. No ureteral stones or hydronephrosis. Adrenal glands unremarkable. Urinary bladder is decompressed, but bladder wall appears thickened. Stomach/Bowel: Scattered colonic diverticulosis. No active diverticulitis. Stomach, large and small bowel grossly unremarkable. Vascular/Lymphatic: Aortic atherosclerosis. No evidence of aneurysm or adenopathy. Reproductive: Uterus and adnexa unremarkable.  No mass. Other: No free fluid or free air. Musculoskeletal: No acute bony abnormality. Postoperative changes in the spine. IMPRESSION: Bladder wall appears thickened although decompressed and difficult to evaluate. Cannot exclude cystitis. Punctate nonobstructing  right midpole renal stone. Aortic atherosclerosis. Hiatal hernia. Electronically Signed   By: Rolm Baptise M.D.   On: 07/18/2020 12:14   DG CHEST PORT 1 VIEW  Result Date: 07/18/2020 CLINICAL DATA:  Cough, shortness of breath, low LEFT anterior chest pain, history hypertension, coronary artery disease, atrial fibrillation post cardioversion, CHF EXAM: PORTABLE CHEST 1 VIEW COMPARISON:  Portable exam 0844 hours compared to 07/16/2020 FINDINGS: Enlargement of cardiac silhouette. Pulmonary vascularity normal. Eventration of RIGHT diaphragm again seen. Large hiatal hernia. Atelectasis versus consolidation at LEFT base. Remaining lungs clear. No pleural effusion or pneumothorax. Bones demineralized. Extensive orthopedic hardware of thoracolumbar spine. IMPRESSION: Large hiatal hernia. Atelectasis versus consolidation LEFT lower lobe. Electronically Signed   By: Lavonia Dana M.D.   On: 07/18/2020 08:53   DG ESOPHAGUS W SINGLE CM (SOL OR THIN BA)  Result Date: 07/18/2020 CLINICAL DATA:  Dysphagia with coughing and choking. Globus sensation. Recent pill stuck in throat. EXAM: ESOPHOGRAM/BARIUM SWALLOW TECHNIQUE: Single contrast examination  was performed using  thin barium. FLUOROSCOPY TIME:  Fluoroscopy Time:  2 minutes 42 seconds Radiation Exposure Index (if provided by the fluoroscopic device): 21.2 mGy Number of Acquired Spot Images: 0 COMPARISON:  None. FINDINGS: Exam was suboptimal due to patient's inability to stand and perform bolus swallowing. The patient was only able to drink a small amount of thin barium through a straw in LPO position. This shows no evidence of esophageal mass. A persistent corkscrew appearance of the distal thoracic esophagus is seen, consistent with esophageal spasm. Complete absence of normal peristalsis was demonstrated, which causes esophageal stasis. Barium did subsequently pass into the stomach, and demonstrated a moderate to large sliding hiatal hernia. IMPRESSION: Technically  suboptimal exam. Corkscrew appearance of lower thoracic esophagus, consistent with esophageal spasm. Moderate to large sliding hiatal hernia. Electronically Signed   By: Marlaine Hind M.D.   On: 07/18/2020 12:51        Scheduled Meds: . albuterol  2.5 mg Nebulization TID  . apixaban  5 mg Oral BID  . Chlorhexidine Gluconate Cloth  6 each Topical Daily  . diltiazem  60 mg Oral Q6H  . fentaNYL  1 patch Transdermal Q72H  . fluticasone  1 spray Each Nare Daily  . guaiFENesin  600 mg Oral BID  . melatonin  3 mg Oral QHS  . pantoprazole sodium  40 mg Oral BID  . rifaximin  550 mg Oral BID  . sodium chloride  2 spray Each Nare BID   Continuous Infusions: . cefTRIAXone (ROCEPHIN)  IV 1 g (07/18/20 2012)     LOS: 3 days    Time spent: 35 minutes.     Elmarie Shiley, MD [facility]   If 7PM-7AM, please contact night-coverage [email or web address]  07/19/2020, 3:02 PM

## 2020-07-20 LAB — CBC
HCT: 34.6 % — ABNORMAL LOW (ref 36.0–46.0)
Hemoglobin: 11.3 g/dL — ABNORMAL LOW (ref 12.0–15.0)
MCH: 31.7 pg (ref 26.0–34.0)
MCHC: 32.7 g/dL (ref 30.0–36.0)
MCV: 96.9 fL (ref 80.0–100.0)
Platelets: 178 10*3/uL (ref 150–400)
RBC: 3.57 MIL/uL — ABNORMAL LOW (ref 3.87–5.11)
RDW: 13.4 % (ref 11.5–15.5)
WBC: 5.3 10*3/uL (ref 4.0–10.5)
nRBC: 0 % (ref 0.0–0.2)

## 2020-07-20 MED ORDER — POTASSIUM CHLORIDE CRYS ER 20 MEQ PO TBCR: 20.0000 meq | EXTENDED_RELEASE_TABLET | Freq: Every day | ORAL | 3 refills | Status: DC

## 2020-07-20 MED ORDER — GUAIFENESIN ER 600 MG PO TB12: 600.0000 mg | ORAL_TABLET | Freq: Two times a day (BID) | ORAL | 0 refills | Status: DC

## 2020-07-20 MED ORDER — SUCRALFATE 1 GM/10ML PO SUSP
1.0000 g | Freq: Three times a day (TID) | ORAL | Status: DC
  Administered 2020-07-20: 1 g via ORAL
  Filled 2020-07-20: qty 10

## 2020-07-20 MED ORDER — FLUTICASONE PROPIONATE 50 MCG/ACT NA SUSP: 1.0000 | Freq: Every day | NASAL | 2 refills | Status: DC

## 2020-07-20 MED ORDER — SUCRALFATE 1 GM/10ML PO SUSP: 1.0000 g | Freq: Three times a day (TID) | ORAL | 0 refills | Status: DC

## 2020-07-20 MED ORDER — ALBUTEROL SULFATE HFA 108 (90 BASE) MCG/ACT IN AERS
2.0000 | INHALATION_SPRAY | Freq: Four times a day (QID) | RESPIRATORY_TRACT | 0 refills | Status: DC | PRN
Start: 2020-07-20 — End: 2020-08-15

## 2020-07-20 MED ORDER — CEFDINIR 250 MG/5ML PO SUSR: 300.0000 mg | Freq: Two times a day (BID) | ORAL | 0 refills | Status: AC

## 2020-07-20 MED ORDER — CEFDINIR 250 MG/5ML PO SUSR
300.0000 mg | Freq: Two times a day (BID) | ORAL | Status: DC
  Filled 2020-07-20 (×2): qty 6

## 2020-07-20 NOTE — Progress Notes (Signed)
  Speech Language Pathology Treatment: Dysphagia  Patient Details Name: Devanny Palecek Bornhorst MRN: [id number] DOB: 1941/12/13 Today's Date: 07/20/2020 Time: 4599-7741 SLP Time Calculation (min) (ACUTE ONLY): 19 min  Assessment / Plan / Recommendation Clinical Impression  Saw pt with RN to administer medications this date following choking episode with large potassium pill.  Pt was able to swallow pills without overt s/s of aspiration, but did exhibit grimacing at times and appeared to have some globus sensation, which was relieved with following pill with puree.  Pt seemed to have greatest ease taking pills whole with puree, although taking them with thin liquid does not appear unsafe.  Pt would like to avoid crushing pills as she finds it unpalatable.  Pt seems to have some anxiety around taking medications at present, which is understandable following her choking event.  Recommend that pt take breaks as needed when taking daily meds.  Recommend continuing regular texture solids and thin liquids.  Administer medications as tolerated.     HPI HPI: 79yo female admitted 07/16/20 with acute respiratory distress following choking on a potassium pill. PMH: hiatal hernia, oropharyngeal and esophageal dysphagia with esophageal stricture, IBS, anxiety, depression, chronic diastolic heart failure, CAD, PCI, AFIb, HTN, HLD, anemia, GERD. BaSw 2011 - dysmotility with tertiary contractions, delayed emptying.      SLP Plan  Continue with current plan of care       Recommendations  Diet recommendations: Regular;Thin liquid Medication Administration: Whole meds with puree Supervision: Patient able to self feed Compensations: Slow rate;Small sips/bites;Follow solids with liquid Postural Changes and/or Swallow Maneuvers: Upright 30-60 min after meal;Seated upright 90 degrees                Oral Care Recommendations: Oral care BID Follow up Recommendations: None SLP Visit Diagnosis: Dysphagia, unspecified  (R13.10) Plan: Continue with current plan of care       [place] , [place], [place] Office: [phone]; Pager 7/31: [phone] 07/20/2020, 11:46 AM

## 2020-07-20 NOTE — Discharge Summary (Signed)
Physician Discharge Summary  [name] [name] SPQ:[id number] DOB: [birth year] DOA: 07/16/2020  PCP: [name], DO  Admit date: 07/16/2020 Discharge date: 07/20/2020  Admitted From: Home  Disposition: Home   Recommendations for Outpatient Follow-up:  1. Follow up with PCP in 1-2 weeks 2. Please obtain BMP/CBC in one week 3. Follow up with GI for further care of Hiatal hernia, and chronic diarrhea   Home Health: yes, PT.   Discharge Condition: Stable.  CODE STATUS: Full coe Diet recommendation: Heart Healthy   Brief/Interim Summary: 79 year old with known past medical history of hiatal hernia, esophageal and oral pharyngeal dysphagia.  On 7/26 she had an episode where she choked on potassium pills.  She developed progressive increased work of breathing, copious upper airway secretion and worsening turbulent upper airway noises, initial soft tissue neck imaging without evidence of airway obstruction.  Critical care admitted patient due to worsening shortness of breath and new A. fib with RVR.  7/26 choking event on potassium pill, seen, imaging all without evidence of upper airway obstruction or aspiration discharge to home. 81/27 patient present back to the ED continue to complain of sore throat unable to breath and cough.  Copious oral secretions developed rapid ventricular response on telemetry   1-Dysphagia; Remained stable overnight CT soft tissue neck and chest showed tonsil calcification, fishbone cannot be ruled out ENT consulted: underwent flexible fiberoptic laryngoscopy; examination unremarkable.  Esophagogram; showed esophageal spasm, moderate to large sliding hiatal hernia.  Continue with PPI twice daily. Evaluated by speech. She pass swallow eval.  Tolerating regular diet.   2-Hiatal hernia and oropharyngeal esophageal dysphagia Speech therapy consulted Esophagogram; showed esophageal spasm, moderate to large sliding hiatal hernia.  Left side upper quadrant  abdominal pain. CT abdomen pelvis: Bladder wall appears thickened although decompressed and difficult to evaluate. Cannot exclude cystitis. Punctate nonobstructing right midpole renal stone. Hiatal hernia.  Tolerating diet, no nausea or vomiting.  Needs to follow up with GI and sx carafate solution for gerd  3-A fib RVR : Continue with Cardizem drip.  Holding Eliquis. Cardiology consulted.  On heparin gtt. Plan to transition to eliquis.  Patient was able to tolerate pills, plan to resume long acting home dose cardizem at discharge   4-respiratory distress after choking on p.o.:Cough Improved/ Continue with flonase, nebulizer.   5-Upper airway cough syndrome: Postnasal drip. Flonase added by pulmonologist. improved.   6-UTI; report dysuria. UA with too numerous to count.  Started ceftriaxone. Received 2 days. Plan to discharge on cefdinir for 3 days.   7-Abdominal pain, diarrhea; one episode today, chronic. IBS.  Resume xifaxan.  Improved.   8-history of diastolic heart failure: resume diuretic at discharge   Discharge Diagnoses:  Active Problems:   Acute respiratory distress   Choking    Discharge Instructions  Discharge Instructions    Diet - low sodium heart healthy   Complete by: As directed    Increase activity slowly   Complete by: As directed      Allergies as of 07/20/2020      Reactions   Buprenorphine Hcl Itching   Cymbalta [duloxetine Hcl] Other (See Comments)   Other reaction(s): Other (See Comments) Personality changes - crying  2014   Morphine And Related Itching   Oxycodone-acetaminophen Other (See Comments)   Personality changes    Valium [diazepam] Other (See Comments)   Personality changes - "in another world" ;  Hallucinations  2015   Statins Other (See Comments)   Other reaction(s): Other (See Comments)  Muscle weakness Muscle weakness   Altace [ramipril] Other (See Comments)   unknown   Floxin [ofloxacin] Other (See Comments)    unknown   Hydromorphone Other (See Comments)   Other reaction(s): Confusion (intolerance) unknown   Lovaza [omega-3-acid Ethyl Esters] Other (See Comments)   Muscle weakness    Trilipix [choline Fenofibrate] Other (See Comments)   Muscle weakness    Zetia [ezetimibe] Other (See Comments)   Muscle weakness       Medication List    STOP taking these medications   CALCIUM + D PO   diphenoxylate-atropine 2.5-0.025 MG tablet Commonly known as: LOMOTIL   IRON PO   methocarbamol 500 MG tablet Commonly known as: ROBAXIN     TAKE these medications   albuterol 108 (90 Base) MCG/ACT inhaler Commonly known as: VENTOLIN HFA Inhale 2 puffs into the lungs every 6 (six) hours as needed for wheezing or shortness of breath.   aspirin EC 81 MG tablet Take 1 tablet (81 mg total) by mouth daily.   cefdinir 250 MG/5ML suspension Commonly known as: OMNICEF Take 6 mLs (300 mg total) by mouth 2 (two) times daily for 3 days.   clobetasol cream 0.05 % Commonly known as: TEMOVATE Apply 1 application topically See admin instructions. Applies to vaginal area twice a week.   diltiazem 120 MG 24 hr capsule Commonly known as: CARDIZEM CD Take 1 capsule (120 mg total) by mouth 2 (two) times daily.   Eliquis 5 MG Tabs tablet Generic drug: apixaban Take 1 tablet (5 mg total) by mouth 2 (two) times daily.   Eszopiclone 3 MG Tabs Take 1 tablet (3 mg total) by mouth at bedtime. For insomnia,take immediately before bedtime.   fentaNYL 50 MCG/HR Commonly known as: DURAGESIC Place 1 patch (50 mcg total) onto the skin every 3 (three) days.   FLUoxetine 40 MG capsule Commonly known as: PROZAC Take 1 capsule (40 mg total) by mouth daily.   fluticasone 50 MCG/ACT nasal spray Commonly known as: FLONASE Place 1 spray into both nostrils daily.   furosemide 80 MG tablet Commonly known as: LASIX Take 1 tablet (80 mg total) by mouth daily.   guaiFENesin 600 MG 12 hr tablet Commonly known as:  MUCINEX Take 1 tablet (600 mg total) by mouth 2 (two) times daily.   HYDROcodone-acetaminophen 10-325 MG tablet Commonly known as: NORCO Take 1 tablet by mouth every 4 (four) hours as needed for moderate pain.   multivitamin tablet Take 1 tablet by mouth daily. For supplement   nitroGLYCERIN 0.4 MG SL tablet Commonly known as: Nitrostat Place 1 tablet (0.4 mg total) under the tongue every 5 (five) minutes as needed for chest pain (up to 3 doses).   NONFORMULARY OR COMPOUNDED ITEM Antifungal solution: Terbinafine 3%, Fluconazole 2%, Tea Tree Oil 5%, Urea 10%, Ibuprofen 2% in DMSO suspension #4mL What changed:   how much to take  how to take this  when to take this  additional instructions   pantoprazole 40 MG tablet Commonly known as: PROTONIX Take 1 tablet (40 mg total) by mouth 2 (two) times daily.   potassium chloride SA 20 MEQ tablet Commonly known as: KLOR-CON Take 1 tablet (20 mEq total) by mouth daily. dissolve  tablet on water. What changed: additional instructions   rosuvastatin 10 MG tablet Commonly known as: CRESTOR Take 1 tablet (10 mg total) by mouth daily. What changed:   how much to take  additional instructions   sucralfate 1 GM/10ML suspension Commonly known as:  CARAFATE Take 10 mLs (1 g total) by mouth 4 (four) times daily -  with meals and at bedtime.   triamcinolone cream 0.1 % Commonly known as: KENALOG Apply 1 application topically 3 (three) times daily.   Vitamin D 50 MCG (2000 UT) tablet Take 2,000 Units by mouth daily.   Xifaxan 550 MG Tabs tablet Generic drug: rifaximin Take 550 mg by mouth 2 (two) times daily. X 10 days.       Follow-up Information    [facility] Follow up.   Why: Physical Therapy-office to call by Wednesday to schedule a visit time.  Contact information: [place] [address] [phone]        Richardson Dopp T, PA-C Follow up.   Specialties: Cardiology, Physician  Assistant Why: [facility] - a follow-up has been arranged for you on Wednesday July 31, 2020 at 3:15 PM (Arrive by 3:00 PM). Nicki Reaper is one of the PAs that works with Dr. Burt Knack. Contact information: [address] [place] [address] [phone]              Allergies  Allergen Reactions  . Buprenorphine Hcl Itching  . Cymbalta [Duloxetine Hcl] Other (See Comments)    Other reaction(s): Other (See Comments) Personality changes - crying  2014  . Morphine And Related Itching  . Oxycodone-Acetaminophen Other (See Comments)    Personality changes   . Valium [Diazepam] Other (See Comments)    Personality changes - "in another world" ;  Hallucinations  2015  . Statins Other (See Comments)    Other reaction(s): Other (See Comments) Muscle weakness Muscle weakness  . Altace [Ramipril] Other (See Comments)    unknown  . Floxin [Ofloxacin] Other (See Comments)    unknown  . Hydromorphone Other (See Comments)    Other reaction(s): Confusion (intolerance) unknown  . Lovaza [Omega-3-Acid Ethyl Esters] Other (See Comments)    Muscle weakness   . Trilipix [Choline Fenofibrate] Other (See Comments)    Muscle weakness   . Zetia [Ezetimibe] Other (See Comments)    Muscle weakness     Consultations:  Cardiology  ENT  CCM   Procedures/Studies: DG Neck Soft Tissue  Result Date: 07/16/2020 CLINICAL DATA:  Difficulty swallowing EXAM: NECK SOFT TISSUES - 1+ VIEW COMPARISON:  July 15, 2020 FINDINGS: The cervical spine is visualized from C1-C7. Partial visualization of upper thoracic spine posterior fixation hardware.Cervical alignment is maintained. Vertebral body heights are maintained: no evidence of acute fracture. Multilevel intervertebral disc space height loss and osteophyte proliferation. Bilateral facet arthropathy. Osteopenia. LEFT carotid atherosclerotic calcifications. No prevertebral soft tissue swelling. Visualized thorax is unremarkable.  IMPRESSION: 1. No prevertebral soft tissue edema. Consider dedicated speech swallow evaluation given indication of choking and difficulty swallowing. 2. No evidence of acute osseous abnormality. 3. LEFT carotid atherosclerotic calcifications. Electronically Signed   By: Valentino Saxon MD   On: 07/16/2020 11:27   DG Neck Soft Tissue  Result Date: 07/15/2020 CLINICAL DATA:  Choked on a pill.  Sore throat EXAM: NECK SOFT TISSUES - 1+ VIEW COMPARISON:  None. FINDINGS: Densities over the throat are attributed to cartilage calcification. No ovoid density typical of a retained pill. No detected airway swelling or narrowing. Cervical spine degeneration in keeping with age. Upper thoracic spine fusion. IMPRESSION: No visible foreign body or upper airway swelling. Electronically Signed   By: Monte Fantasia M.D.   On: 07/15/2020 04:49   DG Chest 2 View  Result Date: 07/16/2020  CLINICAL DATA:  Shortness of breath EXAM: CHEST - 2 VIEW COMPARISON:  07/15/2020 FINDINGS: Stable mild cardiomegaly. Known large hiatal hernia was better seen on prior imaging. Unchanged eventration of the anterior aspect of the right hemidiaphragm. No focal airspace consolidation, pleural effusion, or pneumothorax. Extensive thoracolumbar fusion hardware. Degenerative changes of the shoulders, right worse than left. IMPRESSION: No active cardiopulmonary disease.  No interval change prior. Electronically Signed   By: Davina Poke D.O.   On: 07/16/2020 11:05   DG Chest 2 View  Result Date: 07/15/2020 CLINICAL DATA:  Choked on a potassium pill with epigastric pain and throat burning EXAM: CHEST - 2 VIEW COMPARISON:  04/03/2020 FINDINGS: Extensive thoracic and lumbar spine fusion. No cardiomegaly when accounting for a large hiatal hernia. Chronic interstitial reticulation. There is no edema, consolidation, effusion, or pneumothorax. Eventration of the right diaphragm. IMPRESSION: Stable compared to prior.  No evidence of acute disease.  Electronically Signed   By: Monte Fantasia M.D.   On: 07/15/2020 04:50   CT Soft Tissue Neck W Contrast  Result Date: 07/16/2020 CLINICAL DATA:  79 year old female with recent coughing, choking, noisy respirations. Globus sensation, feels pill stuck in throat. EXAM: CT NECK WITH CONTRAST TECHNIQUE: Multidetector CT imaging of the neck was performed using the standard protocol following the bolus administration of intravenous contrast. CONTRAST:  5mL OMNIPAQUE IOHEXOL 300 MG/ML  SOLN COMPARISON:  Chest CT today reported separately. Neck soft tissue radiographs earlier today. FINDINGS: Pharynx and larynx: Larynx and epiglottis are within normal limits. Pharyngeal soft tissue contours are within normal limits. Negative parapharyngeal spaces. Negative retropharyngeal space. A tiny postinflammatory calcification of the left palatine tonsil is suspected on series 3, image 53 and coronal image 53. No definite radiopaque foreign body identified. Salivary glands: Negative sublingual space. Submandibular and parotid glands are within normal limits. Thyroid: Negative. Lymph nodes: Negative.  No cervical lymphadenopathy. Vascular: Major vascular structures in the neck and at the skull base are patent. Tortuous carotid arteries with bilateral calcified atherosclerosis. Limited intracranial: Negative. Visualized orbits: Negative; postoperative changes to both globes. Mastoids and visualized paranasal sinuses: Paranasal sinuses, tympanic cavities and mastoids are clear. Skeleton: Upper thoracic posterior spinal fusion hardware is partially visible. No acute osseous abnormality identified. Upper chest: Reported separately. IMPRESSION: 1. A punctate density at the left palatine tonsil is favored to be a small postinflammatory calcification. Note that an impacted fishbone can have a similar appearance. But there is otherwise no radiopaque foreign body identified. 2. Otherwise negative CT appearance of the neck; tortuous carotid  arteries with calcified atherosclerosis. 3.  CT Chest today reported separately. Electronically Signed   By: Genevie Ann M.D.   On: 07/16/2020 22:52   CT Chest W Contrast  Result Date: 07/16/2020 CLINICAL DATA:  Swallowed foreign body. Patient feels pill stuck in throat. EXAM: CT CHEST WITH CONTRAST TECHNIQUE: Multidetector CT imaging of the chest was performed during intravenous contrast administration. Performed in conjunction with CT of the neck. CONTRAST:  33mL OMNIPAQUE IOHEXOL 300 MG/ML  SOLN COMPARISON:  Chest radiograph earlier today.  Chest CT 11/25/2016 FINDINGS: Cardiovascular: Tortuous thoracic aorta with atherosclerosis. No acute aortic abnormality or aneurysm. Mild cardiomegaly. There are coronary artery calcifications. No pericardial effusion. Mediastinum/Nodes: Patulous esophagus with small volume of retained fluid, decreased retained fluid from 2017. There is a large hiatal hernia with greater than 50% of the stomach intrathoracic. No visualized radiopaque foreign body, extensive streak artifact from thoracic spinal fusion hardware partially obscures evaluation. No pneumomediastinum or evidence of perforation.  No enlarged mediastinal or hilar lymph nodes. Right thyroid calcification without well-defined nodule. Lungs/Pleura: No evidence of tracheal or bronchial foreign body. There is minimal retained mucus within the distal trachea. Heterogeneous pulmonary parenchyma. Mild lower lobe bronchial thickening. Compressive atelectasis in the left lower lobe related to hiatal hernia. No pleural fluid. No findings of pulmonary edema. No pneumothorax. Upper Abdomen: Post cholecystectomy with biliary dilatation, similar to prior exam. No acute or unexpected findings. Musculoskeletal: Extensive posterior thoracic fusion with rod and intrapedicular screws. Remote T12 fracture. Focal kyphosis at T2-T3 above the level effusion is unchanged from prior exam. IMPRESSION: 1. No evidence of retained foreign body or  pill. 2. Large hiatal hernia with greater than 50% of the stomach intrathoracic, unchanged from prior exam. Chronically patulous esophagus with small volume of retained fluid, decreased retained fluid from 2017. 3. Minimal mucus in the trachea.  Mild central bronchial thickening. 4. Heterogeneous pulmonary parenchyma, can be seen with small vessel or small airways disease. 5. Aortic atherosclerosis. Mild cardiomegaly with coronary artery calcifications. Aortic Atherosclerosis (ICD10-I70.0). Electronically Signed   By: Keith Rake M.D.   On: 07/16/2020 22:55   CT ABDOMEN PELVIS W CONTRAST  Result Date: 07/18/2020 CLINICAL DATA:  Left side pain EXAM: CT ABDOMEN AND PELVIS WITH CONTRAST TECHNIQUE: Multidetector CT imaging of the abdomen and pelvis was performed using the standard protocol following bolus administration of intravenous contrast. CONTRAST:  135mL OMNIPAQUE IOHEXOL 300 MG/ML  SOLN COMPARISON:  10/31/2010 FINDINGS: Lower chest: Large hiatal hernia. Left base atelectasis. No acute abnormality. Hepatobiliary: No focal liver abnormality is seen. Status post cholecystectomy. No biliary dilatation. Pancreas: No focal abnormality or ductal dilatation. Spleen: No focal abnormality.  Normal size. Adrenals/Urinary Tract: Punctate nonobstructing stone in the midpole of the right kidney. Scarring in the midpole of the right kidney. No ureteral stones or hydronephrosis. Adrenal glands unremarkable. Urinary bladder is decompressed, but bladder wall appears thickened. Stomach/Bowel: Scattered colonic diverticulosis. No active diverticulitis. Stomach, large and small bowel grossly unremarkable. Vascular/Lymphatic: Aortic atherosclerosis. No evidence of aneurysm or adenopathy. Reproductive: Uterus and adnexa unremarkable.  No mass. Other: No free fluid or free air. Musculoskeletal: No acute bony abnormality. Postoperative changes in the spine. IMPRESSION: Bladder wall appears thickened although decompressed and  difficult to evaluate. Cannot exclude cystitis. Punctate nonobstructing right midpole renal stone. Aortic atherosclerosis. Hiatal hernia. Electronically Signed   By: Rolm Baptise M.D.   On: 07/18/2020 12:14   DG CHEST PORT 1 VIEW  Result Date: 07/18/2020 CLINICAL DATA:  Cough, shortness of breath, low LEFT anterior chest pain, history hypertension, coronary artery disease, atrial fibrillation post cardioversion, CHF EXAM: PORTABLE CHEST 1 VIEW COMPARISON:  Portable exam 0844 hours compared to 07/16/2020 FINDINGS: Enlargement of cardiac silhouette. Pulmonary vascularity normal. Eventration of RIGHT diaphragm again seen. Large hiatal hernia. Atelectasis versus consolidation at LEFT base. Remaining lungs clear. No pleural effusion or pneumothorax. Bones demineralized. Extensive orthopedic hardware of thoracolumbar spine. IMPRESSION: Large hiatal hernia. Atelectasis versus consolidation LEFT lower lobe. Electronically Signed   By: Lavonia Dana M.D.   On: 07/18/2020 08:53   DG ESOPHAGUS W SINGLE CM (SOL OR THIN BA)  Result Date: 07/18/2020 CLINICAL DATA:  Dysphagia with coughing and choking. Globus sensation. Recent pill stuck in throat. EXAM: ESOPHOGRAM/BARIUM SWALLOW TECHNIQUE: Single contrast examination was performed using  thin barium. FLUOROSCOPY TIME:  Fluoroscopy Time:  2 minutes 42 seconds Radiation Exposure Index (if provided by the fluoroscopic device): 21.2 mGy Number of Acquired Spot Images: 0 COMPARISON:  None. FINDINGS: Exam was  suboptimal due to patient's inability to stand and perform bolus swallowing. The patient was only able to drink a small amount of thin barium through a straw in LPO position. This shows no evidence of esophageal mass. A persistent corkscrew appearance of the distal thoracic esophagus is seen, consistent with esophageal spasm. Complete absence of normal peristalsis was demonstrated, which causes esophageal stasis. Barium did subsequently pass into the stomach, and  demonstrated a moderate to large sliding hiatal hernia. IMPRESSION: Technically suboptimal exam. Corkscrew appearance of lower thoracic esophagus, consistent with esophageal spasm. Moderate to large sliding hiatal hernia. Electronically Signed   By: Marlaine Hind M.D.   On: 07/18/2020 12:51     Subjective: Report improvement of left side abdominal pain Had one BM yesterday.  She will like to try to swallow pills.   Discharge Exam: Vitals:   07/20/20 0813 07/20/20 1100  BP: 108/71 (!) 117/58  Pulse: 92 88  Resp: 20 18  Temp: 98.3 F (36.8 C) 98.4 F (36.9 C)  SpO2: 95% 97%     General: Pt is alert, awake, not in acute distress Cardiovascular: RRR, S1/S2 +, no rubs, no gallops Respiratory: CTA bilaterally, no wheezing, no rhonchi Abdominal: Soft, NT, ND, bowel sounds + Extremities: no edema, no cyanosis    The results of significant diagnostics from this hospitalization (including imaging, microbiology, ancillary and laboratory) are listed below for reference.     Microbiology: Recent Results (from the past 240 hour(s))  SARS Coronavirus 2 by RT PCR (hospital order, performed in [facility] hospital lab) Nasopharyngeal Nasopharyngeal Swab     Status: None   Collection Time: 07/16/20 12:01 PM   Specimen: Nasopharyngeal Swab  Result Value Ref Range Status   SARS Coronavirus 2 NEGATIVE NEGATIVE Final    Comment: (NOTE) SARS-CoV-2 target nucleic acids are NOT DETECTED.  The SARS-CoV-2 RNA is generally detectable in upper and lower respiratory specimens during the acute phase of infection. The lowest concentration of SARS-CoV-2 viral copies this assay can detect is 250 copies / mL. A negative result does not preclude SARS-CoV-2 infection and should not be used as the sole basis for treatment or other patient management decisions.  A negative result may occur with improper specimen collection / handling, submission of specimen other than nasopharyngeal swab, presence of viral  mutation(s) within the areas targeted by this assay, and inadequate number of viral copies (<250 copies / mL). A negative result must be combined with clinical observations, patient history, and epidemiological information.  Fact Sheet for Patients:   StrictlyIdeas.no  Fact Sheet for Healthcare Providers: BankingDealers.co.za  This test is not yet approved or  cleared by the [place] FDA and has been authorized for detection and/or diagnosis of SARS-CoV-2 by FDA under an Emergency Use Authorization (EUA).  This EUA will remain in effect (meaning this test can be used) for the duration of the COVID-19 declaration under Section 564(b)(1) of the Act, 21 U.S.C. section 360bbb-3(b)(1), unless the authorization is terminated or revoked sooner.  Performed at [facility], [place] [address], [place], [place] [address]   MRSA PCR Screening     Status: None   Collection Time: 07/16/20  9:00 PM   Specimen: Nasal Mucosa; Nasopharyngeal  Result Value Ref Range Status   MRSA by PCR NEGATIVE NEGATIVE Final    Comment:        The GeneXpert MRSA Assay (FDA approved for NASAL specimens only), is one component of a comprehensive MRSA colonization surveillance program. It is not  intended to diagnose MRSA infection nor to guide or monitor treatment for MRSA infections. Performed at [facility], [place] [address], [place], [place] [address]      Labs: BNP (last 3 results) No results for input(s): BNP in the last 8760 hours. Basic Metabolic Panel: Recent Labs  Lab 07/16/20 1050 07/17/20 0118 07/18/20 0500 07/19/20 0639  NA 139 139 138 138  K 3.7 3.6 3.5 3.2*  CL 99 103 108 105  CO2 26 26 17* 24  GLUCOSE 132* 114* 79 106*  BUN 10 8 7* 5*  CREATININE 0.89 0.74 0.70 0.66  CALCIUM 9.6 8.7* 8.4* 8.7*  MG  --  2.1  --   --    Liver Function Tests: No results for input(s): AST, ALT, ALKPHOS, BILITOT, PROT, ALBUMIN in the last  168 hours. No results for input(s): LIPASE, AMYLASE in the last 168 hours. No results for input(s): AMMONIA in the last 168 hours. CBC: Recent Labs  Lab 07/16/20 1050 07/17/20 0118 07/18/20 0314 07/19/20 0639 07/20/20 0225  WBC 10.6* 7.6 6.4 5.2 5.3  HGB 13.1 11.8* 12.6 11.1* 11.3*  HCT 42.0 37.6 40.0 34.9* 34.6*  MCV 98.1 95.9 98.5 95.9 96.9  PLT 249 193 170 169 178   Cardiac Enzymes: No results for input(s): CKTOTAL, CKMB, CKMBINDEX, TROPONINI in the last 168 hours. BNP: Invalid input(s): POCBNP CBG: Recent Labs  Lab 07/17/20 2341 07/18/20 0338 07/18/20 0828 07/18/20 0926 07/19/20 1620  GLUCAP 70 73 68* 119* 153*   D-Dimer No results for input(s): DDIMER in the last 72 hours. Hgb A1c No results for input(s): HGBA1C in the last 72 hours. Lipid Profile No results for input(s): CHOL, HDL, LDLCALC, TRIG, CHOLHDL, LDLDIRECT in the last 72 hours. Thyroid function studies No results for input(s): TSH, T4TOTAL, T3FREE, THYROIDAB in the last 72 hours.  Invalid input(s): FREET3 Anemia work up No results for input(s): VITAMINB12, FOLATE, FERRITIN, TIBC, IRON, RETICCTPCT in the last 72 hours. Urinalysis    Component Value Date/Time   COLORURINE YELLOW 07/18/2020 1340   APPEARANCEUR CLOUDY (A) 07/18/2020 1340   APPEARANCEUR Clear 06/21/2018 1248   LABSPEC >1.046 (H) 07/18/2020 1340   PHURINE 5.0 07/18/2020 1340   GLUCOSEU NEGATIVE 07/18/2020 1340   HGBUR MODERATE (A) 07/18/2020 1340   BILIRUBINUR NEGATIVE 07/18/2020 1340   BILIRUBINUR Negative 06/21/2018 1248   KETONESUR 20 (A) 07/18/2020 1340   PROTEINUR NEGATIVE 07/18/2020 1340   UROBILINOGEN 0.2 10/27/2014 0729   NITRITE NEGATIVE 07/18/2020 1340   LEUKOCYTESUR LARGE (A) 07/18/2020 1340   Sepsis Labs Invalid input(s): PROCALCITONIN,  WBC,  LACTICIDVEN Microbiology Recent Results (from the past 240 hour(s))  SARS Coronavirus 2 by RT PCR (hospital order, performed in [facility] lab) Nasopharyngeal  Nasopharyngeal Swab     Status: None   Collection Time: 07/16/20 12:01 PM   Specimen: Nasopharyngeal Swab  Result Value Ref Range Status   SARS Coronavirus 2 NEGATIVE NEGATIVE Final    Comment: (NOTE) SARS-CoV-2 target nucleic acids are NOT DETECTED.  The SARS-CoV-2 RNA is generally detectable in upper and lower respiratory specimens during the acute phase of infection. The lowest concentration of SARS-CoV-2 viral copies this assay can detect is 250 copies / mL. A negative result does not preclude SARS-CoV-2 infection and should not be used as the sole basis for treatment or other patient management decisions.  A negative result may occur with improper specimen collection / handling, submission of specimen other than nasopharyngeal swab, presence of viral mutation(s) within the areas  targeted by this assay, and inadequate number of viral copies (<250 copies / mL). A negative result must be combined with clinical observations, patient history, and epidemiological information.  Fact Sheet for Patients:   StrictlyIdeas.no  Fact Sheet for Healthcare Providers: BankingDealers.co.za  This test is not yet approved or  cleared by the [place] FDA and has been authorized for detection and/or diagnosis of SARS-CoV-2 by FDA under an Emergency Use Authorization (EUA).  This EUA will remain in effect (meaning this test can be used) for the duration of the COVID-19 declaration under Section 564(b)(1) of the Act, 21 U.S.C. section 360bbb-3(b)(1), unless the authorization is terminated or revoked sooner.  Performed at [facility], [place] [address] [place], [place] [address]   MRSA PCR Screening     Status: None   Collection Time: 07/16/20  9:00 PM   Specimen: Nasal Mucosa; Nasopharyngeal  Result Value Ref Range Status   MRSA by PCR NEGATIVE NEGATIVE Final    Comment:        The GeneXpert MRSA Assay (FDA approved for NASAL  specimens only), is one component of a comprehensive MRSA colonization surveillance program. It is not intended to diagnose MRSA infection nor to guide or monitor treatment for MRSA infections. Performed at [facility], [place] [address], [place], [place] [address]      Time coordinating discharge: 40 minutes  SIGNED:   Elmarie Shiley, MD  [facility]

## 2020-07-31 ENCOUNTER — Ambulatory Visit: Payer: Medicare HMO | Admitting: Physician Assistant

## 2020-07-31 ENCOUNTER — Other Ambulatory Visit: Payer: Self-pay

## 2020-07-31 ENCOUNTER — Encounter: Payer: Self-pay | Admitting: Physician Assistant

## 2020-07-31 VITALS — BP 122/60 | HR 96 | Ht 67.0 in | Wt 183.0 lb

## 2020-07-31 DIAGNOSIS — R0602 Shortness of breath: Secondary | ICD-10-CM

## 2020-07-31 DIAGNOSIS — I4821 Permanent atrial fibrillation: Secondary | ICD-10-CM | POA: Diagnosis not present

## 2020-07-31 DIAGNOSIS — I10 Essential (primary) hypertension: Secondary | ICD-10-CM | POA: Diagnosis not present

## 2020-07-31 DIAGNOSIS — G4733 Obstructive sleep apnea (adult) (pediatric): Secondary | ICD-10-CM

## 2020-07-31 DIAGNOSIS — I519 Heart disease, unspecified: Secondary | ICD-10-CM | POA: Diagnosis not present

## 2020-07-31 DIAGNOSIS — I25119 Atherosclerotic heart disease of native coronary artery with unspecified angina pectoris: Secondary | ICD-10-CM

## 2020-07-31 DIAGNOSIS — R1310 Dysphagia, unspecified: Secondary | ICD-10-CM

## 2020-07-31 NOTE — Progress Notes (Signed)
Cardiology Office Note:    Date:  07/31/2020   ID:  [name] [name], DOB 17-Sep-1941, MRN [id number]  PCP:  [name], DO  Cardiologist:  Sherren Mocha, MD   Electrophysiologist:  None   Referring MD: [name], DO   Chief Complaint:  Hospitalization Follow-up (AFib w/ RVR; meds held b/c NPO)    Patient Profile:    [name] [name] is a 79 y.o. female with:   Coronary artery disease ? Cath 12/15: Complex bifurcational distal LM/ostial LCx disease- poorCABG candidate ? PCI 12/15: DES to distal LM and ostial LCx ? Myoview 11/17: + Inferior ischemia ? Cath 12/17: Patent LM/LCx stent; no significant RCA disease ? Myoview 6/21: No ischemia  Primarily Diastolic CHF  Echocardiogram 4/21: EF 45-50, mild LVH, mild MR, mild-mod TR, trivial AI  Permanent atrial fibrillation ? S/p DCCV 1/17>>ERAF>>rate control strategy  Hypertension  Hyperlipidemia  S/p multiple back surgeries; managed with chronic narcotic pain medication  Hxof orthostatic hypotension; s/pmultiple falls in the past  Hepatic steatosis (elevated LFTs)  MGUS  Hx of CVA  Prior CV studies: Myoview 05/29/20 EF 44, no ischemia, intermediate risk due to low EF  Echocardiogram 04/15/2020 EF 45-50, no R WMA, mild LVH, mildly reduced RV SF, mild LAE, mild MR, mild-moderate TR, trivial AI, RVSP 38.3  Echocardiogram 11/24/2018 EF 50-55, normal wall motion, trivial AI, trivial MR, severe LAE, moderate RAE, mild TR, PASP 35  ABIs 01/18/2018 Final Interpretation: Right: Resting right ankle-brachial index is within normal range. No evidence of significant right lower extremity arterial disease. The right toe-brachial index is normal.  Left: Resting left ankle-brachial index is within normal range. No evidence of significant left lower extremity arterial disease. The left toe-brachial index is normal.  Echocardiogram 12/22/2017 EF 55-60, normal wall motion, mild MAC, trivial MR, severe LAE,  normal RV SF, mild TR, PASP 31  UE Vascular US 12/17 Patent right radial artery and veins, without evidence of fistula or pseudoaneurysm. Probable hematoma anterior to the distal right radial artery.  LHC 11/25/16 LM stent patent (stent extends from LM into pLAD) LAD irregs LCx stent patent with 25 RCA mid 40  Myoview 11/19/16 EF 48, poor quality/significant artifact; inf-lateral, inferior ischemia; Intermediate Risk  Holter 09/10/16 The basic rhythm is atrial fibrillation with average HR 97 bpm There are occasional ventricular ectopics No other arrhythmia identified No bradycardic events  Holter 3/17 The baseline rhythm is atrial fibrillation The average ventricular rate is 96 bpm There are periods of rapid atrial fib and slow atrial fib with pauses up to 3 seconds There are single PVC's or aberrated beats with a rare ventricular couplet  Echo 01/29/16 EF 50-55%, trivial AI, mild MR, mod LAE, PASP 37 mmHg  LHC 1/16 LM dist ? 50% LAD prox 30% - FFR 0.82 LCx ostial 70% - FFR 0.86 >> 0.66 RCA mid 40% CABG deferred b/c of poor functional capacity HDQ:QIWLNLG bifurcational distal LM and ostial LCx with rotational atherectomy and IVUS guided with 2.75 x 12 mm Synergy DES to ostial LCx;4 x 16 mm Synergy DES to distal LM  Myoview 12/15 Intermediate risk stress nuclear study Moderate area of anteroapical and anteroseptal ischemia Also inferolateral wall infarct with moderate peri infarct ischemiaStudy suggests multi vessel diseae.LV Ejection Fraction: 57%  Echo 7/11 Mild LVH, EF 55-60%, no RWMA, mild MR  History of Present Illness:    [name] was last seen in 04/2020.  She was mainly limited by chronic back pain but also noted symptoms  of shortness of breath.  A Lexiscan Myoview demonstrated no ischemia.  The study was called low risk due to low EF.  But her EF was low normal on echocardiogram in 03/2020 (EF 45-50).    She was admitted 7/27-7/31 with pill  dysphagia that resulted in worsening shortness of breath.  All her medications have been held, including her rate controlling medication, resulting in atrial fibrillation with rapid ventricular rate.  She was seen by cardiology to help manage her atrial fibrillation.  An esophagram did demonstrate esophageal spasm.  She was seen by ENT and laryngoscopy was unremarkable.  Her swallow evaluation was normal with speech therapy.    She returns for follow-up.  She is here with her daughter.  She continues to have issues with shortness of breath.  She is short of breath with any activity.  Overall, her dyspnea is stable.  She sleeps on a slight incline.  She has not had syncope.  She has mild pedal edema without significant change.  She has not had chest discomfort.  She continues to have issues with hoarseness.  She has not seen her primary care doctor or gastroenterologist since discharge.  Past Medical History:  Diagnosis Date  . Abnormal nuclear cardiac imaging test   . Anemia   . Anemia, iron deficiency 07/02/2015  . Anxiety   . Arthritis    "knees, back, fingers, toes; joints" (01/07/2015)  . 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 CAD (coronary artery disease)    a. Abnl nuc 11/2014. Cath 12/2014 - turned down for CABG. Ultimately s/p TTVP, rotational atherectomy, PTCA and stenting of the ostial LCx and left main into the LAD (crush technique), and IVUS of the LAD/Left main. // b. Myoview 11/17: EF 48, poor quality/significant artifact; inf-lateral, inferior ischemia; Intermediate Risk  . Chronic atrial fibrillation ([place])    a.  First noted post-op 9/15 spinal fusion. She had cardioversion, not on anticoagulation. Fall risk, unsteady. // failed DCCV // Holter 10/17: AFib, Avg HR 97, PVCs, no other arrhythmia  . Chronic back pain greater than 3 months duration    a. spinal stenosis. Spinal fusion with rods in 2/15 at [facility] spinal fusion 9/15.  Marland Kitchen Chronic diastolic CHF    Echo 1/54:  EF 50-55%,  trivial AI, midl MR, mod LAE, PASP 37 mmHg // Echocardiogram 03/2020: EF 45-50, no RWMA, mild LVH, mild reduced RVSF, mildly elevated PASP (RVSP 38.3), mild LAE, mild MR, mild to mod TR, trivial AI   . Circadian rhythm sleep disorder   . CTS (carpal tunnel syndrome)   . Deficiency anemia 11/10/2014  . Depression   . Diarrhea   . Diverticulitis of colon   . Esophageal stricture   . Gastritis   . Gastroesophageal hernia 07/06/2013  . GERD (gastroesophageal reflux disease)   . Hiatal hernia   . History of blood transfusion    "most of them related to OR's"   . HTN (hypertension)   . Hyperlipidemia   . IBS (irritable bowel syndrome)   . Incontinence 10/13/2012  . Insomnia   . Ischemic chest pain ([place])   . Memory disorder 12/04/2014  . Metabolic syndrome 0/0/8676  . MGUS (monoclonal gammopathy of unknown significance) dx'd 11/2014   a. Neg BMB 11/2014.  . Multiple falls   . Obesity   . Obstructive sleep apnea    "have mask; don't wear it" (01/07/2015)  . Orthostasis   . PAT (paroxysmal atrial tachycardia) ([place])   . Personal history of colonic polyps 10/25/2011 &  12/02/11   not retrieved Dr Lyla Son & tubular adenomas  . Pneumonia 03/2014  . Sinus bradycardia    a. Baseline HR 50s-60s.  . Stroke [facility]) early 2000's   "small"; denies residual on 01/07/2015)    Current Medications: Current Meds  Medication Sig  . albuterol (VENTOLIN HFA) 108 (90 Base) MCG/ACT inhaler Inhale 2 puffs into the lungs every 6 (six) hours as needed for wheezing or shortness of breath.  Marland Kitchen aspirin EC 81 MG tablet Take 1 tablet (81 mg total) by mouth daily.  . Cholecalciferol (VITAMIN D) 2000 units tablet Take 2,000 Units by mouth daily.  . clobetasol cream (TEMOVATE) 5.28 % Apply 1 application topically See admin instructions. Applies to vaginal area twice a week.  . diltiazem (CARDIZEM CD) 120 MG 24 hr capsule Take 1 capsule (120 mg total) by mouth 2 (two) times daily.  Marland Kitchen ELIQUIS 5 MG TABS tablet Take 1  tablet (5 mg total) by mouth 2 (two) times daily.  . Eszopiclone 3 MG TABS Take 1 tablet (3 mg total) by mouth at bedtime. For insomnia,take immediately before bedtime.  . fentaNYL (DURAGESIC - DOSED MCG/HR) 50 MCG/HR Place 1 patch (50 mcg total) onto the skin every 3 (three) days.  Marland Kitchen FLUoxetine (PROZAC) 40 MG capsule Take 1 capsule (40 mg total) by mouth daily.  . fluticasone (FLONASE) 50 MCG/ACT nasal spray Place 1 spray into both nostrils daily.  Marland Kitchen guaiFENesin (MUCINEX) 600 MG 12 hr tablet Take 1 tablet (600 mg total) by mouth 2 (two) times daily.  Marland Kitchen HYDROcodone-acetaminophen (NORCO) 10-325 MG per tablet Take 1 tablet by mouth every 4 (four) hours as needed for moderate pain.   . Multiple Vitamin (MULTIVITAMIN) tablet Take 1 tablet by mouth daily. For supplement  . nitroGLYCERIN (NITROSTAT) 0.4 MG SL tablet Place 1 tablet (0.4 mg total) under the tongue every 5 (five) minutes as needed for chest pain (up to 3 doses).  . NONFORMULARY OR COMPOUNDED ITEM Apply 1 application topically daily. Antifungal solution: Terbinafine 3%, Fluconazole 2%, Tea Tree Oil 5%, Urea 10%, Ibuprofen 2% in DMSO suspension #35m  . pantoprazole (PROTONIX) 40 MG tablet Take 1 tablet (40 mg total) by mouth 2 (two) times daily.  . potassium chloride SA (KLOR-CON) 20 MEQ tablet Take 1 tablet (20 mEq total) by mouth daily. dissolve  tablet on water.  . rosuvastatin (CRESTOR) 10 MG tablet Take 1 tablet (10 mg total) by mouth daily.  . sucralfate (CARAFATE) 1 GM/10ML suspension Take 10 mLs (1 g total) by mouth 4 (four) times daily -  with meals and at bedtime.  . triamcinolone cream (KENALOG) 0.1 % Apply 1 application topically 3 (three) times daily.   .Marland KitchenXIFAXAN 550 MG TABS tablet Take 550 mg by mouth 2 (two) times daily. X 10 days.     Allergies:   Buprenorphine hcl, Cymbalta [duloxetine hcl], Morphine and related, Oxycodone-acetaminophen, Valium [diazepam], Statins, Altace [ramipril], Floxin [ofloxacin], Hydromorphone, Lovaza  [omega-3-acid ethyl esters], Trilipix [choline fenofibrate], and Zetia [ezetimibe]   Social History   Tobacco Use  . Smoking status: Never Smoker  . Smokeless tobacco: Never Used  Vaping Use  . Vaping Use: Never used  Substance Use Topics  . Alcohol use: No    Comment: 01/07/2015 "glass of wine at Christmas, maybe"  . Drug use: No     Family Hx: The patient's family history includes Colon cancer in her sister and sister; Coronary artery disease in her brother, father, and mother; Emphysema in her brother  and sister; Peripheral vascular disease in her father; Sleep apnea in her son. There is no history of Dementia.  Review of Systems  Gastrointestinal: Negative for hematochezia and melena.  Genitourinary: Negative for hematuria.     EKGs/Labs/Other Test Reviewed:    EKG:  EKG is   ordered today.  The ekg ordered today demonstrates atrial fibrillation, HR 96, normal axis, no ST-T wave changes, QTC 447, no change from prior tracing  Recent Labs: 04/01/2020: ALT 18 07/17/2020: Magnesium 2.1 07/19/2020: BUN 5; Creatinine, Ser 0.66; Potassium 3.2; Sodium 138 07/20/2020: Hemoglobin 11.3; Platelets 178   Recent Lipid Panel Lab Results  Component Value Date   CHOL 138 07/20/2019   HDL 61 07/20/2019   LDLCALC 52 07/20/2019   TRIG 123 07/20/2019   CHOLHDL 2.3 07/20/2019     Physical Exam:    VS:  BP 122/60   Pulse 96   Ht _0  (1.702 m)   Wt 183 lb (83 kg)   SpO2 91%   BMI 28.66 kg/m     Wt Readings from Last 3 Encounters:  07/31/20 183 lb (83 kg)  07/20/20 183 lb 3.9 oz (83.1 kg)  07/15/20 184 lb (83.5 kg)     Constitutional:      Appearance: Healthy appearance. Not in distress.  Pulmonary:     Effort: Pulmonary effort is normal.     Breath sounds: No wheezing. No rales.  Cardiovascular:     Normal rate. Irregularly irregular rhythm. Normal S1. Normal S2.     Murmurs: There is no murmur.  Edema:    Pretibial: bilateral trace edema of the pretibial area.    Ankle:  bilateral trace edema of the ankle. Abdominal:     Palpations: Abdomen is soft.  Musculoskeletal:     Cervical back: Neck supple. Skin:    General: Skin is warm and dry.  Neurological:     Mental Status: Alert and oriented to person, place and time.     Cranial Nerves: Cranial nerves are intact.       ASSESSMENT & PLAN:    1. Permanent atrial fibrillation (HCC) Heart rate is well controlled.  She remains on diltiazem.  Her LV function has slightly decreased over the last few years.  We discussed the importance of discontinuing calcium channel blockers in the setting of LV dysfunction.  I have recommended repeating a limited echocardiogram to reassess her LV function.  If her ejection fraction remains low or is lower, I will change her diltiazem to metoprolol tartrate.  Continue current dose of Apixaban.  Recent creatinine, hemoglobin stable.  2. Coronary artery disease involving native coronary artery of native heart with angina pectoris [facility]) History of bifurcational PCI with DES to the distal left main and ostial LCx in January 2016. Cardiac catheterization in 2017 demonstrated patent stents.    Myoview in June 2021 neg for ischemia.  She has not had chest discomfort.  Continue aspirin, rosuvastatin.  3. LV dysfunction 4. Shortness of breath She has a history of diastolic heart failure and remains on furosemide 80 mg daily.  Her volume status appears stable.  As noted, her EF has gone down somewhat in the past couple of years.  I will repeat a limited echo as noted above.  At this point, I suspect her shortness of breath is multifactorial.  I think deconditioning related to her back problems is likely the biggest cause of her shortness of breath.  I reviewed her records from the hospital.  Her CT  scan did not demonstrate evidence of pulmonary edema.  Continue current dose of furosemide.  -Obtain limited 2D echo  5. Essential hypertension The patient's blood pressure is controlled on her  current regimen.  Continue current therapy.   6. Obstructive sleep apnea syndrome She has a history of sleep apnea.  She has not been able to tolerate CPAP in the past.  She is willing to attempt treatment again.  I will arrange a home sleep study to start the process.  7. Pill dysphagia She continues to have issues with hoarseness since the episode that brought her to the hospital.  I have encouraged her to follow-up with primary care and gastroenterology for further evaluation and management.   Dispo:  Return in about 3 months (around 10/31/2020) for Routine Follow Up, w/ Dr. Burt Knack, or Richardson Dopp, PA-C, in person.   Medication Adjustments/Labs and Tests Ordered: Current medicines are reviewed at length with the patient today.  Concerns regarding medicines are outlined above.  Tests Ordered: Orders Placed This Encounter  Procedures  . Ambulatory referral to Sleep Studies  . EKG 12-Lead  . ECHOCARDIOGRAM LIMITED   Medication Changes: No orders of the defined types were placed in this encounter.   Signed, Richardson Dopp, PA-C  07/31/2020 4:28 PM    [facility] [place], [place], [place]  [address] Phone: [phone]; Fax: [phone]

## 2020-07-31 NOTE — Patient Instructions (Signed)
Medication Instructions:  Your physician recommends that you continue on your current medications as directed. Please refer to the Current Medication list given to you today.  Lab Work: None ordered today  Testing/Procedures: Your physician has requested that you have an limited echocardiogram. Echocardiography is a painless test that uses sound waves to create images of your heart. It provides your doctor with information about the size and shape of your heart and how well your heart's chambers and valves are working. This procedure takes approximately one hour. There are no restrictions for this procedure.  Follow-Up: On 12/27/2020 at 2:20PM with Talbot Grumbling, MD

## 2020-08-02 ENCOUNTER — Other Ambulatory Visit: Payer: Self-pay | Admitting: Family Medicine

## 2020-08-02 DIAGNOSIS — I2510 Atherosclerotic heart disease of native coronary artery without angina pectoris: Secondary | ICD-10-CM

## 2020-08-02 DIAGNOSIS — I480 Paroxysmal atrial fibrillation: Secondary | ICD-10-CM

## 2020-08-02 DIAGNOSIS — I482 Chronic atrial fibrillation, unspecified: Secondary | ICD-10-CM

## 2020-08-02 DIAGNOSIS — I5032 Chronic diastolic (congestive) heart failure: Secondary | ICD-10-CM

## 2020-08-05 MED ORDER — FUROSEMIDE 80 MG PO TABS: 80.0000 mg | ORAL_TABLET | Freq: Every day | ORAL | 0 refills | Status: DC

## 2020-08-05 MED ORDER — FLUOXETINE HCL 40 MG PO CAPS: 40.0000 mg | ORAL_CAPSULE | Freq: Every day | ORAL | 0 refills | Status: DC

## 2020-08-05 MED ORDER — DILTIAZEM HCL ER COATED BEADS 120 MG PO CP24: 120.0000 mg | ORAL_CAPSULE | Freq: Two times a day (BID) | ORAL | 0 refills | Status: DC

## 2020-08-05 NOTE — Telephone Encounter (Signed)
Refills failed. resent 

## 2020-08-05 NOTE — Addendum Note (Signed)
Addended by: Antonietta Barcelona D on: 08/05/2020 04:27 PM   Modules accepted: Orders

## 2020-08-07 MED ORDER — FLUOXETINE HCL 40 MG PO CAPS: 40.0000 mg | ORAL_CAPSULE | Freq: Every day | ORAL | 0 refills | Status: DC

## 2020-08-07 MED ORDER — DILTIAZEM HCL ER COATED BEADS 120 MG PO CP24: 120.0000 mg | ORAL_CAPSULE | Freq: Two times a day (BID) | ORAL | 0 refills | Status: DC

## 2020-08-07 MED ORDER — FUROSEMIDE 80 MG PO TABS: 80.0000 mg | ORAL_TABLET | Freq: Every day | ORAL | 0 refills | Status: DC

## 2020-08-07 NOTE — Telephone Encounter (Signed)
Refill failed. resent °

## 2020-08-07 NOTE — Telephone Encounter (Signed)
Refills failed. Printed to be faxed

## 2020-08-07 NOTE — Addendum Note (Signed)
Addended by: Antonietta Barcelona D on: 08/07/2020 08:09 AM   Modules accepted: Orders

## 2020-08-07 NOTE — Addendum Note (Signed)
Addended by: Antonietta Barcelona D on: 08/07/2020 08:40 AM   Modules accepted: Orders

## 2020-08-09 ENCOUNTER — Telehealth: Payer: Self-pay

## 2020-08-09 DIAGNOSIS — G4733 Obstructive sleep apnea (adult) (pediatric): Secondary | ICD-10-CM

## 2020-08-09 NOTE — Telephone Encounter (Signed)
Per Richardson Dopp, PA 07/31/20 Office Note  She has a history of sleep apnea.  She has not been able to tolerate CPAP in the past.  She is willing to attempt treatment again.  I will arrange a home sleep study to start the process  Order in an sent to [place]

## 2020-08-13 ENCOUNTER — Telehealth: Payer: Self-pay | Admitting: *Deleted

## 2020-08-13 ENCOUNTER — Telehealth: Payer: Self-pay | Admitting: Family Medicine

## 2020-08-13 DIAGNOSIS — I482 Chronic atrial fibrillation, unspecified: Secondary | ICD-10-CM

## 2020-08-13 DIAGNOSIS — I5032 Chronic diastolic (congestive) heart failure: Secondary | ICD-10-CM

## 2020-08-13 DIAGNOSIS — I480 Paroxysmal atrial fibrillation: Secondary | ICD-10-CM

## 2020-08-13 DIAGNOSIS — I2510 Atherosclerotic heart disease of native coronary artery without angina pectoris: Secondary | ICD-10-CM

## 2020-08-13 MED ORDER — PANTOPRAZOLE SODIUM 40 MG PO TBEC: 40.0000 mg | DELAYED_RELEASE_TABLET | Freq: Two times a day (BID) | ORAL | 0 refills | Status: DC

## 2020-08-13 NOTE — Telephone Encounter (Signed)
Staff message sent to Gae Bon ok to schedule HST. Insurance does not require PA for HST.

## 2020-08-13 NOTE — Telephone Encounter (Signed)
Printed refill on Pantoprazole, had issues last week in refilling meds, will fax to [place] after signed by provider

## 2020-08-14 ENCOUNTER — Ambulatory Visit: Payer: Medicare HMO | Admitting: Pulmonary Disease

## 2020-08-14 ENCOUNTER — Other Ambulatory Visit: Payer: Self-pay

## 2020-08-14 ENCOUNTER — Encounter: Payer: Self-pay | Admitting: Pulmonary Disease

## 2020-08-14 DIAGNOSIS — I5032 Chronic diastolic (congestive) heart failure: Secondary | ICD-10-CM | POA: Diagnosis not present

## 2020-08-14 DIAGNOSIS — R1310 Dysphagia, unspecified: Secondary | ICD-10-CM | POA: Diagnosis not present

## 2020-08-14 DIAGNOSIS — R1319 Other dysphagia: Secondary | ICD-10-CM

## 2020-08-14 DIAGNOSIS — T17908D Unspecified foreign body in respiratory tract, part unspecified causing other injury, subsequent encounter: Secondary | ICD-10-CM

## 2020-08-14 NOTE — Assessment & Plan Note (Signed)
Suspect recurrent aspiration even though she cleared swallow evaluation in the hospital which is causing persistent symptoms .  She had oropharyngeal as well as esophageal dysphagia  Reflux precautions -sleep upright position with 2 pillows, small meals  Swallow precautions -small meals, chew properly before swallowing, she is down every bite with liquid , brush potassium pills   Keep gastroenterology appointment. If symptoms persist can consider repeat swallow evaluation

## 2020-08-14 NOTE — Telephone Encounter (Signed)
[name] [name] with [facility] calling to request the Protonix to be sent to them instead of the local [facility].

## 2020-08-14 NOTE — Patient Instructions (Signed)
We will provide you with spacer Take 2 puffs of albuterol MDI every 8 hours as needed -too much of this medication can cause your heart to race.  Reflux precautions -sleep upright position with 2 pillows, small meals  Swallow precautions -small meals, chew properly before swallowing, she is down every bite with liquid , brush potassium pills   Keep gastroenterology appointment. If symptoms persist can consider repeat swallow evaluation

## 2020-08-14 NOTE — Progress Notes (Signed)
   Subjective:    Patient ID: [name] [name], female    DOB: 04/22/1941, 79 y.o.   MRN: [id number]  HPI  79 year old for follow-up of OSA/treatment emergent central apneas  PMH -elevated right hemidiaphragm, chronic diastolic heart failure, atrial fibrillation on Eliquis, hiatal hernia, chronic pain on narcotics   Chief Complaint  Patient presents with  . Follow-up    pt stated taking meds pill went down wrong way.pt coughs up yellow mucus   She was admitted 7/27-7/31 for an aspiration episode. She has longstanding esophageal and oral pharyngeal dysphagia.  Per discharge summary -on 7/26 she had an episode where she choked on potassium pills. She developed progressive increased work of breathing, copious upper airway secretion and worsening turbulent upper airway noises, initial soft tissue neck imaging without evidence of airway obstruction.  Critical care admitted patient due to worsening shortness of breath and new A. fib with RVR.    -Dysphagia; Remained stable overnight CT soft tissue neck and chest showed tonsil calcification, fishbone cannot be ruled out ENT consulted: underwent flexible fiberoptic laryngoscopy; examination unremarkable.  Esophagogram; showed esophageal spasm, moderate to large sliding hiatal hernia.  Continue with PPI twice daily. Evaluated by speech. She pass swallow eval.   Hiatal hernia and oropharyngeal esophageal dysphagia Speech therapy consulted Esophagogram; showed esophageal spasm, moderate to large sliding hiatal hernia.  Left side upper quadrant abdominal pain. CT abdomen pelvis: Bladder wall appears thickened although decompressed and difficult to evaluate. Cannot exclude cystitis. Punctate nonobstructing right midpole renal stone. Hiatal hernia.  She continues to have breathing issues, voice coarse hoarse with talking, rattling noises in her chest She is very concerned that 3 to 4 weeks after this event, not fully resolved. She has an  upcoming appointment with orthopedics and gastroenterology  She has several complaints today, recurrent rattling in her chest, shortness of breath, pain in her legs She was provided with albuterol MDI but has been unable to use  Significant tests/ events reviewed  10/2006 NPSG-RDI 68/hour, lowest desaturation 86%  07/2007 CPAP titrated from 6 to 21 cm, then BiPAP 23/21 with residual events persistent, central apneas+   CT chest 11/2016-large hiatal hernia 11/2018 echo-EF 55%, RVSP 35  Review of Systems Patient denies significant ,cough, hemoptysis,  chest pain, palpitations, pedal edema, orthopnea, paroxysmal nocturnal dyspnea, lightheadedness, nausea, vomiting, abdominal or  leg pains      Objective:   Physical Exam  Gen. Pleasant, elderly, anxious affect ENT - no lesions, no post nasal drip Neck: No JVD, no thyromegaly, no carotid bruits Lungs: no use of accessory muscles, no dullness to percussion, decreased without rales or rhonchi  Cardiovascular: Rhythm regular, heart sounds  normal, no murmurs or gallops, no peripheral edema Musculoskeletal: No deformities, no cyanosis or clubbing , no tremors       Assessment & Plan:

## 2020-08-14 NOTE — Assessment & Plan Note (Signed)
Remains on Lasix, no evidence of fluid overload. No significant pedal edema, last potassium was 3.2, advise recheck with her in a hurry to get to an appointment [facility] so we will defer to PCP She is back on potassium supplementation

## 2020-08-14 NOTE — Assessment & Plan Note (Signed)
Swallow precautions as provided by swallow therapist in the hospital [facility] if she is recurrently aspirating  We will provide you with spacer Take 2 puffs of albuterol MDI every 8 hours as needed -too much of this medication can cause your heart to race.

## 2020-08-15 ENCOUNTER — Telehealth: Payer: Self-pay | Admitting: Pulmonary Disease

## 2020-08-15 MED ORDER — ALBUTEROL SULFATE HFA 108 (90 BASE) MCG/ACT IN AERS: 2.0000 | INHALATION_SPRAY | Freq: Four times a day (QID) | RESPIRATORY_TRACT | 1 refills | Status: DC | PRN

## 2020-08-15 MED ORDER — FLUOXETINE HCL 40 MG PO CAPS: 40.0000 mg | ORAL_CAPSULE | Freq: Every day | ORAL | 0 refills | Status: DC

## 2020-08-15 MED ORDER — PANTOPRAZOLE SODIUM 40 MG PO TBEC: 40.0000 mg | DELAYED_RELEASE_TABLET | Freq: Two times a day (BID) | ORAL | 0 refills | Status: DC

## 2020-08-15 MED ORDER — DILTIAZEM HCL ER COATED BEADS 120 MG PO CP24: 120.0000 mg | ORAL_CAPSULE | Freq: Two times a day (BID) | ORAL | 0 refills | Status: DC

## 2020-08-15 MED ORDER — FUROSEMIDE 80 MG PO TABS: 80.0000 mg | ORAL_TABLET | Freq: Every day | ORAL | 0 refills | Status: DC

## 2020-08-15 NOTE — Telephone Encounter (Signed)
Spoke with the pt's daughter  Pt needing a refill on her albuterol inhaler  Rx was sent to preferred pharm and nothing further needed

## 2020-08-15 NOTE — Telephone Encounter (Signed)
Spoke with daughter, 4 meds need to be sent to [facility] in [place] [employer] name. Done

## 2020-08-19 ENCOUNTER — Telehealth: Payer: Self-pay | Admitting: *Deleted

## 2020-08-19 NOTE — Telephone Encounter (Signed)
-----   Message from Lauralee Evener, [place] sent at 08/13/2020 10:24 AM EDT ----- Regarding: RE: [place] to schedule sleep study. Insurance does not require a PA. For HST. ----- Message ----- From: Bobby Rumpf, CMA Sent: 08/09/2020   2:05 PM EDT To: [facility] Sleep Studies Subject: Home Sleep                                     Hey,  Please precert for Home Sleep study. Order is in  Thanks

## 2020-08-19 NOTE — Telephone Encounter (Signed)
Patient/daughter per dpr is aware and agreeable to Home Sleep Study through [facility]. Patient is scheduled for 08/20/20 at 7:30 to pick up home sleep kit and meet with Respiratory therapist at [facility]. Patient is aware that if this appointment date and time does not work for them they should contact Artis Delay directly at [phone]. Patient is aware that a sleep packet will be sent from [facility] in week. Patient/daughter is agreeable to treatment and thankful for call.

## 2020-08-19 NOTE — Telephone Encounter (Signed)
Electronic Rxs where in error inbasket again, TC to [facility], they did receive prescriptions and member received them on 08/17/20

## 2020-08-20 ENCOUNTER — Other Ambulatory Visit: Payer: Self-pay

## 2020-08-20 ENCOUNTER — Ambulatory Visit: Payer: Medicare HMO | Admitting: Cardiology

## 2020-08-20 ENCOUNTER — Other Ambulatory Visit (HOSPITAL_COMMUNITY): Payer: Medicare HMO

## 2020-08-20 ENCOUNTER — Ambulatory Visit: Payer: Medicare HMO | Attending: Internal Medicine

## 2020-08-20 DIAGNOSIS — I10 Essential (primary) hypertension: Secondary | ICD-10-CM | POA: Diagnosis not present

## 2020-08-20 DIAGNOSIS — Z8673 Personal history of transient ischemic attack (TIA), and cerebral infarction without residual deficits: Secondary | ICD-10-CM | POA: Insufficient documentation

## 2020-08-20 DIAGNOSIS — I2510 Atherosclerotic heart disease of native coronary artery without angina pectoris: Secondary | ICD-10-CM | POA: Diagnosis not present

## 2020-08-20 DIAGNOSIS — I4891 Unspecified atrial fibrillation: Secondary | ICD-10-CM | POA: Diagnosis not present

## 2020-08-20 DIAGNOSIS — G4733 Obstructive sleep apnea (adult) (pediatric): Secondary | ICD-10-CM | POA: Diagnosis present

## 2020-08-20 DIAGNOSIS — R0602 Shortness of breath: Secondary | ICD-10-CM

## 2020-08-20 DIAGNOSIS — E785 Hyperlipidemia, unspecified: Secondary | ICD-10-CM | POA: Diagnosis not present

## 2020-08-20 DIAGNOSIS — R0600 Dyspnea, unspecified: Secondary | ICD-10-CM | POA: Insufficient documentation

## 2020-08-20 DIAGNOSIS — I519 Heart disease, unspecified: Secondary | ICD-10-CM

## 2020-08-21 ENCOUNTER — Encounter: Payer: Self-pay | Admitting: Physician Assistant

## 2020-08-21 LAB — ECHOCARDIOGRAM LIMITED
Area-P 1/2: 5.13 cm2
P 1/2 time: 669 msec
S' Lateral: 3.5 cm

## 2020-08-24 NOTE — Procedures (Signed)
° ° °  Patient Name: [name] [name], [name] [name] Date: 08/20/2020 Gender: Female D.O.B: [birth year] Age (years): 19 Referring Provider: Richardson Dopp Height (inches): 46 Interpreting Physician: Fransico Him MD, ABSM Weight (lbs): 180 RPSGT: Peak, Robert BMI: 28 MRN: [id number]  CLINICAL INFORMATION Sleep Study Type: HST  Indication for sleep study: N/A  Epworth Sleepiness Score: N/A  SLEEP STUDY TECHNIQUE A multi-channel overnight portable sleep study was performed. The channels recorded were: nasal airflow, thoracic respiratory movement, and oxygen saturation with a pulse oximetry. Snoring was also monitored.  MEDICATIONS Patient self administered medications include: N/A.  SLEEP ARCHITECTURE Patient was studied for 475.4 minutes. The sleep efficiency was 99.1 % and the patient was supine for 14%. The arousal index was 0.0 per hour.  RESPIRATORY PARAMETERS The overall AHI was 47.7 per hour, with a central apnea index of 1.5 per hour.  The oxygen nadir was 78% during sleep.  CARDIAC DATA Mean heart rate during sleep was 69.0 bpm.  IMPRESSIONS - Severe obstructive sleep apnea occurred during this study (AHI = 47.7/h). - No significant central sleep apnea occurred during this study (CAI = 1.5/h). - Severe oxygen desaturation was noted during this study (Min O2 = 78%). - Patient snored 20.3% during the sleep.  DIAGNOSIS - Obstructive Sleep Apnea (G47.33) - Nocturnal Hypoxemia (G47.36)  RECOMMENDATIONS - Recommend ResMed CPAP on auto from 4-18cm H2O with heated humidity and mask of choice. - Avoid alcohol, sedatives and other CNS depressants that may worsen sleep apnea and disrupt normal sleep architecture. - Sleep hygiene should be reviewed to assess factors that may improve sleep quality. - Weight management and regular exercise should be initiated or continued.  [Electronically signed] 08/24/2020 09:56 PM  Fransico Him MD, ABSM Diplomate, American Board of Sleep  Medicine

## 2020-08-27 ENCOUNTER — Telehealth: Payer: Self-pay | Admitting: *Deleted

## 2020-08-27 ENCOUNTER — Telehealth: Payer: Self-pay | Admitting: Cardiovascular Disease

## 2020-08-27 DIAGNOSIS — I4821 Permanent atrial fibrillation: Secondary | ICD-10-CM

## 2020-08-27 DIAGNOSIS — G4733 Obstructive sleep apnea (adult) (pediatric): Secondary | ICD-10-CM

## 2020-08-27 NOTE — Telephone Encounter (Signed)
Patient's daughter is returning call to further discuss results from echocardiogram completed on 08/20/20 and medication changes.

## 2020-08-27 NOTE — Progress Notes (Signed)
+   OSA Please make sure CPAP arranged as recommended by Dr. Radford Pax. Richardson Dopp, PA-C    08/27/2020 7:58 AM

## 2020-08-27 NOTE — Telephone Encounter (Signed)
-----   Message from Sueanne Margarita, MD sent at 08/24/2020 10:02 PM EDT ----- Please let patient know that they have sleep apnea and recommend auto CPAP titration through DME  Orders have been placed in Epic. Please set 8 week OV with me. Please get overnight pulse ox on CPAP

## 2020-08-28 MED ORDER — METOPROLOL SUCCINATE ER 50 MG PO TB24: 50.0000 mg | ORAL_TABLET | Freq: Every day | ORAL | 3 refills | Status: DC

## 2020-08-28 NOTE — Telephone Encounter (Signed)
The patients daughter [name] has been notified of the result and verbalized understanding. Ok to speak with [name] per patient DPR. All questions (if any) were answered. Patient will stop Diltiazem and start Metoprolol, prescription sent in. Order placed for 3 day ZIO. Mady Haagensen, [place] 08/28/2020 9:26 AM

## 2020-08-29 NOTE — Telephone Encounter (Signed)
Informed patient of sleep study results and patient understanding was verbalized. Patient understands her sleep study showed they have sleep apnea and recommend auto CPAP titration through DME Orders have been placed in Epic. Please set 8 week OV with me.  Please get overnight pulse ox on CPAP   Upon patient request DME selection is [facility]. Patient understands she will be contacted by [facility] to set up her cpap. Patient understands to call if [facility] does not contact her with new setup in a timely manner. Patient understands they will be called once confirmation has been received from [facility] that they have received their new machine to schedule 10 week follow up appointment.  [place] notified of new cpap order  Please add to airview Patient was grateful for the call and thanked me.

## 2020-09-02 ENCOUNTER — Ambulatory Visit: Payer: Medicare HMO | Admitting: Podiatry

## 2020-09-02 NOTE — Telephone Encounter (Signed)
Pt c/o medication issue:  1. Name of Medication: metoprolol succinate (TOPROL-XL) 50 MG 24 hr tablet  2. How are you currently taking this medication (dosage and times per day)? 1 tablet (50 mg total) by mouth daily with a meal  3. Are you having a reaction (difficulty breathing--STAT)? No  4. What is your medication issue? Patient's daughter states the medication has caused extreme weakness and and upset stomach for the past 3 days. Please return call to further discuss.

## 2020-09-03 DIAGNOSIS — Z5181 Encounter for therapeutic drug level monitoring: Secondary | ICD-10-CM

## 2020-09-03 DIAGNOSIS — Z79899 Other long term (current) drug therapy: Secondary | ICD-10-CM

## 2020-09-03 DIAGNOSIS — I4821 Permanent atrial fibrillation: Secondary | ICD-10-CM

## 2020-09-03 MED ORDER — DIGOXIN 125 MCG PO TABS: 0.1250 mg | ORAL_TABLET | Freq: Every day | ORAL | 11 refills | Status: DC

## 2020-09-03 NOTE — Telephone Encounter (Signed)
I called and spoke with patients daughter [name], ok per patient DPR. [name] is aware to have patient start Digoxin 0.125 mg, 1 tablet by mouth once a day and to discontinue Metoprolol. [name] will bring patient on 09/17/20 to check Digoxin level. Orders in for lab and prescription for Digoxin sent to patients pharmacy and Metoprolol removed from patients list.

## 2020-09-03 NOTE — Telephone Encounter (Signed)
See MyChart message. Metoprolol DC'd. Digoxin 0.125 mg once daily started. Richardson Dopp, PA-C    09/03/2020 1:53 PM

## 2020-09-03 NOTE — Telephone Encounter (Signed)
Agree that's a good idea. Her HR has always been hard to control.

## 2020-09-03 NOTE — Telephone Encounter (Signed)
See phone note in chart as well. I discussed with Dr. Burt Knack.  She can take her last dose of Metoprolol succinate today. Start Digoxin 0.125 mg once daily today. Stop Metoprolol tomorrow (do not take tomorrow). Schedule Dig level in 2 weeks.  Make sure she knows to NOT take Digoxin that morning until after her blood is drawn. She has a monitor that has been ordered.  Make sure she gets this so we can see how her HR control is doing with the Digoxin. Monitor BP and notify us if her BP is > 130/80. I sent Digoxin to her pharmacy and ordered the Dig level.  Richardson Dopp, PA-C    09/03/2020 10:02 AM

## 2020-09-08 ENCOUNTER — Encounter (HOSPITAL_COMMUNITY): Payer: Self-pay

## 2020-09-08 ENCOUNTER — Emergency Department (HOSPITAL_COMMUNITY): Payer: Medicare HMO

## 2020-09-08 ENCOUNTER — Emergency Department (HOSPITAL_COMMUNITY)
Admission: EM | Admit: 2020-09-08 | Discharge: 2020-09-08 | Disposition: A | Discharge status: Home / Self Care | Payer: Medicare HMO | Attending: Emergency Medicine | Admitting: Emergency Medicine

## 2020-09-08 ENCOUNTER — Other Ambulatory Visit: Payer: Self-pay

## 2020-09-08 DIAGNOSIS — M791 Myalgia, unspecified site: Secondary | ICD-10-CM | POA: Diagnosis present

## 2020-09-08 DIAGNOSIS — Z96653 Presence of artificial knee joint, bilateral: Secondary | ICD-10-CM | POA: Diagnosis not present

## 2020-09-08 DIAGNOSIS — I110 Hypertensive heart disease with heart failure: Secondary | ICD-10-CM | POA: Diagnosis not present

## 2020-09-08 DIAGNOSIS — G2581 Restless legs syndrome: Secondary | ICD-10-CM

## 2020-09-08 DIAGNOSIS — Z20822 Contact with and (suspected) exposure to covid-19: Secondary | ICD-10-CM | POA: Diagnosis not present

## 2020-09-08 DIAGNOSIS — R5381 Other malaise: Secondary | ICD-10-CM | POA: Diagnosis not present

## 2020-09-08 DIAGNOSIS — I4891 Unspecified atrial fibrillation: Secondary | ICD-10-CM | POA: Diagnosis not present

## 2020-09-08 DIAGNOSIS — I5032 Chronic diastolic (congestive) heart failure: Secondary | ICD-10-CM | POA: Diagnosis not present

## 2020-09-08 LAB — TROPONIN I (HIGH SENSITIVITY)
Troponin I (High Sensitivity): 15 ng/L (ref <18)
Troponin I (High Sensitivity): 13 ng/L (ref <18)

## 2020-09-08 LAB — CBC
HCT: 40.8 % (ref 36.0–46.0)
Hemoglobin: 13 g/dL (ref 12.0–15.0)
MCH: 29.9 pg (ref 26.0–34.0)
MCHC: 31.9 g/dL (ref 30.0–36.0)
MCV: 93.8 fL (ref 80.0–100.0)
Platelets: 208 10*3/uL (ref 150–400)
RBC: 4.35 MIL/uL (ref 3.87–5.11)
RDW: 12.5 % (ref 11.5–15.5)
WBC: 6.8 10*3/uL (ref 4.0–10.5)
nRBC: 0 % (ref 0.0–0.2)

## 2020-09-08 LAB — BASIC METABOLIC PANEL
Anion gap: 14 (ref 5–15)
BUN: 11 mg/dL (ref 8–23)
CO2: 23 mmol/L (ref 22–32)
Calcium: 9.5 mg/dL (ref 8.9–10.3)
Chloride: 100 mmol/L (ref 98–111)
Creatinine, Ser: 0.87 mg/dL (ref 0.44–1.00)
GFR calc Af Amer: >60 mL/min (ref >60)
GFR calc non Af Amer: >60 mL/min (ref >60)
Glucose, Bld: 116 mg/dL — ABNORMAL HIGH (ref 70–99)
Potassium: 3.4 mmol/L — ABNORMAL LOW (ref 3.5–5.1)
Sodium: 137 mmol/L (ref 135–145)

## 2020-09-08 LAB — SARS CORONAVIRUS 2 BY RT PCR (HOSPITAL ORDER, PERFORMED IN ~~LOC~~ HOSPITAL LAB): SARS Coronavirus 2: NEGATIVE

## 2020-09-08 LAB — PROTIME-INR
INR: 1.6 — ABNORMAL HIGH (ref 0.8–1.2)
Prothrombin Time: 18.6 seconds — ABNORMAL HIGH (ref 11.4–15.2)

## 2020-09-08 LAB — TSH: TSH: 0.992 u[IU]/mL (ref 0.350–4.500)

## 2020-09-08 LAB — DIGOXIN LEVEL: Digoxin Level: 0.9 ng/mL — ABNORMAL LOW (ref 1.0–2.0)

## 2020-09-08 LAB — CK: Total CK: 150 U/L (ref 38–234)

## 2020-09-08 MED ORDER — SODIUM CHLORIDE 0.9 % IV BOLUS
1000.0000 mL | Freq: Once | INTRAVENOUS | Status: AC
  Administered 2020-09-08: 1000 mL via INTRAVENOUS

## 2020-09-08 MED ORDER — HALOPERIDOL LACTATE 5 MG/ML IJ SOLN
2.0000 mg | Freq: Once | INTRAMUSCULAR | Status: AC
  Administered 2020-09-08: 2 mg via INTRAVENOUS
  Filled 2020-09-08: qty 1

## 2020-09-08 MED ORDER — METOPROLOL TARTRATE 5 MG/5ML IV SOLN
5.0000 mg | Freq: Once | INTRAVENOUS | Status: AC
  Administered 2020-09-08: 5 mg via INTRAVENOUS
  Filled 2020-09-08: qty 5

## 2020-09-08 NOTE — Progress Notes (Signed)
Discussed patient with ER staff, presents with genrealized body aches, fatigue. In ER in afib mildly elevated rates 110s-120s. From phone note 9/13 on toprol 50mg  was having weakness and upset stomach, toprol was stopped. Started on digoxin 0.125mg  daily. She is having an outpatient monitor arranged based on clinic notes. She had been on diltiazem CD 120mg  bid, stopped when echo showed LVEF 45%.    Mild tachycardia in ER in setting of systemic symptoms. I think with just mild LV systolic dysfunction given she did not tolerate toprol could consider restarting her diltiazem given limited other options but will defer to her primary cardiologist who has arranged an outpatient monitor. Would not increase digoxin given age. Higher rates could be related to nonspecific systemic symptoms, not to the extreme to warrant admission at this time   [facility] for discharge from ER on current therapy, already has outpatient monitor arranged.    Carlyle Dolly MD

## 2020-09-08 NOTE — ED Provider Notes (Signed)
Medical Decision Making: Care of patient assumed from Dr. Sedonia Small at [phone].  Agree with history, physical exam and plan.  See their note for further details.  Briefly, The pt p/w multiple complaints, possible related to a fib, labs and imaging thus far unremarkable.   Current plan is as follows: cards rec and CK  Cardiology recommends no medication changes at this time.  I feel she is safe to leave the department from a cardiology standpoint given the laboratory studies and EKG has been done today.  Patient had a CK pending to see if there is any signs of muscle inflammation or disease, it is unremarkable.  She is overall with normal vital signs most recent heart rate in the 90s blood pressure 130/69.  Breathing comfortably at a normal rate on room air.  There is no focal deficits no signs of significant tenderness or other need at this point to work-up further in the emergency room.  There may be something else going on but she needs follow-up with her primary care providers.  And she is invited to come back anytime.  I personally reviewed and interpreted all labs/imaging.      Breck Coons, MD 09/08/20 [phone]

## 2020-09-08 NOTE — ED Notes (Signed)
This nurse reached out to patient son r/t discharge. Reports he will be here for discharge ride home.

## 2020-09-08 NOTE — ED Notes (Signed)
Pt d/c home per MD order. Off unit via WC- no s/s of acute distress noted. Off unit via [place]- Pt son here for discharge ride home.

## 2020-09-08 NOTE — ED Triage Notes (Signed)
Patient complains of generalized pain with a\bdominal and chest pain x 1 week. States that she feels cold inside and complains of legs not sitting still. Patient pale, alert and oriernted

## 2020-09-08 NOTE — Discharge Instructions (Addendum)
You were evaluated in the Emergency Department and after careful evaluation, we did not find any emergent condition requiring admission or further testing in the hospital.  Your exam/testing today is overall reassuring.  Please return to the Emergency Department if you experience any worsening of your condition.   Thank you for allowing us to be a part of your care. 

## 2020-09-08 NOTE — ED Provider Notes (Signed)
[facility] Emergency Department Provider Note MRN:  [id number]  Arrival date & time: 09/08/20     Chief Complaint   Body aches History of Present Illness   [name] [name] is a 79 y.o. year-old female with a history of CAD, A. fib, GERD presenting to the ED with chief complaint of body aches.  Patient is endorsing general malaise, restless and achy legs, occasional chest and/or abdominal discomfort for the past week.  Feels "cold on the inside".  Leg discomfort seems to have started ever since being put on digoxin.  Recent hospitalization for A. fib with RVR, also hospitalized for issues with swallowing pills and found to have esophageal spasm.  Has had continued issues with eating and drinking, endorsing poor p.o. intake over the past week.  Denies any vomiting, mild diarrhea this morning.  Symptoms are constant, mild to moderate, no exacerbating or alleviating factors.  Review of Systems  A complete 10 system review of systems was obtained and all systems are negative except as noted in the HPI and PMH.   Patient's Health History    Past Medical History:  Diagnosis Date  . Abnormal nuclear cardiac imaging test   . Anemia   . Anemia, iron deficiency 07/02/2015  . Anxiety   . Arthritis    "knees, back, fingers, toes; joints" (01/07/2015)  . 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 CAD (coronary artery disease)    a. Abnl nuc 11/2014. Cath 12/2014 - turned down for CABG. Ultimately s/p TTVP, rotational atherectomy, PTCA and stenting of the ostial LCx and left main into the LAD (crush technique), and IVUS of the LAD/Left main. // b. Myoview 11/17: EF 48, poor quality/significant artifact; inf-lateral, inferior ischemia; Intermediate Risk  . Chronic atrial fibrillation ([place])    a.  First noted post-op 9/15 spinal fusion. She had cardioversion, not on anticoagulation. Fall risk, unsteady. // failed DCCV // Holter 10/17: AFib, Avg HR 97, PVCs, no other arrhythmia  . Chronic  back pain greater than 3 months duration    a. spinal stenosis. Spinal fusion with rods in 2/15 at [facility] spinal fusion 9/15.  Marland Kitchen Chronic diastolic CHF    Echo 1/17:  EF 50-55%, trivial AI, midl MR, mod LAE, PASP 37 mmHg // Echocardiogram 03/2020: EF 45-50, no RWMA, mild LVH, mild reduced RVSF, mildly elevated PASP (RVSP 38.3), mild LAE, mild MR, mild to mod TR, trivial AI // Echo 9/21: EF 45, mild LVH, mildly reduced RV SF, moderate LAE, mild RAE, mild MR, mild AI     . Circadian rhythm sleep disorder   . CTS (carpal tunnel syndrome)   . Deficiency anemia 11/10/2014  . Depression   . Diarrhea   . Diverticulitis of colon   . Esophageal stricture   . Gastritis   . Gastroesophageal hernia 07/06/2013  . GERD (gastroesophageal reflux disease)   . Hiatal hernia   . History of blood transfusion    "most of them related to OR's"   . HTN (hypertension)   . Hyperlipidemia   . IBS (irritable bowel syndrome)   . Incontinence 10/13/2012  . Insomnia   . Ischemic chest pain ([place])   . Memory disorder 12/04/2014  . Metabolic syndrome 02/22/6700  . MGUS (monoclonal gammopathy of unknown significance) dx'd 11/2014   a. Neg BMB 11/2014.  . Multiple falls   . Obesity   . Obstructive sleep apnea    "have mask; don't wear it" (01/07/2015)  . Orthostasis   . PAT (paroxysmal atrial  tachycardia) ([place])   . Personal history of colonic polyps 10/25/2011 & 12/02/11   not retrieved Dr Lyla Son & tubular adenomas  . Pneumonia 03/2014  . Sinus bradycardia    a. Baseline HR 50s-60s.  . Stroke [facility]) early 2000's   "small"; denies residual on 01/07/2015)    Past Surgical History:  Procedure Laterality Date  . BACK SURGERY    . BONE MARROW BIOPSY  11/2014  . CARDIAC CATHETERIZATION  12/27/2014   Procedure: INTRAVASCULAR PRESSURE WIRE/FFR STUDY;  Surgeon: Larey Dresser, MD;  Location: [facility] CATH LAB;  Service: Cardiovascular;;  . CARDIAC CATHETERIZATION N/A 11/25/2016   Procedure: Left Heart Cath and  Coronary Angiography;  Surgeon: Sherren Mocha, MD;  Location: [place] [facility] CV LAB;  Service: Cardiovascular;  Laterality: N/A;  . CARDIOVERSION N/A 02/13/2016   Procedure: CARDIOVERSION;  Surgeon: Josue Hector, MD;  Location: [facility] ENDOSCOPY;  Service: Cardiovascular;  Laterality: N/A;  . CARPAL TUNNEL RELEASE Right 1980's  . CATARACT EXTRACTION Bilateral   . CATARACT EXTRACTION W/ INTRAOCULAR LENS  IMPLANT, BILATERAL  2000's  . CORONARY ANGIOPLASTY WITH STENT PLACEMENT  01/07/2015   "2"  . CORONARY STENT PLACEMENT    . DILATION AND CURETTAGE OF UTERUS    . ESOPHAGOGASTRODUODENOSCOPY (EGD) WITH ESOPHAGEAL DILATION  "several times"  . JOINT REPLACEMENT    . KNEE ARTHROSCOPY Left 1995  . LAPAROSCOPIC CHOLECYSTECTOMY  2003  . LEFT HEART CATHETERIZATION WITH CORONARY ANGIOGRAM N/A 12/27/2014   Procedure: LEFT HEART CATHETERIZATION WITH CORONARY ANGIOGRAM;  Surgeon: Larey Dresser, MD;  Location: [facility] CATH LAB;  Service: Cardiovascular;  Laterality: N/A;  . PERCUTANEOUS CORONARY ROTOBLATOR INTERVENTION (PCI-R)  01/07/2015  . PERCUTANEOUS CORONARY ROTOBLATOR INTERVENTION (PCI-R) N/A 01/07/2015   Procedure: PERCUTANEOUS CORONARY ROTOBLATOR INTERVENTION (PCI-R);  Surgeon: Blane Ohara, MD;  Location: [facility] CATH LAB;  Service: Cardiovascular;  Laterality: N/A;  . POSTERIOR FUSION THORACIC SPINE  08/2015  . POSTERIOR LUMBAR FUSION  01/2015  . TOTAL KNEE ARTHROPLASTY Bilateral 1990's - 2000's    Family History  Problem Relation Age of Onset  . Coronary artery disease Father   . Peripheral vascular disease Father   . Coronary artery disease Mother   . Coronary artery disease Brother   . Colon cancer Sister   . Emphysema Sister   . Sleep apnea Son   . Colon cancer Sister        spread to her brain  . Emphysema Brother   . Dementia Neg Hx     Social History   Socioeconomic History  . Marital status: Widowed    Spouse name: Not on file  . Number of children: 2  . Years of education: HS  . Highest  education level: Not on file  Occupational History  . Occupation: Licensed conveyancer- retired    Comment: retired  Tobacco Use  . Smoking status: Never Smoker  . Smokeless tobacco: Never Used  Vaping Use  . Vaping Use: Never used  Substance and Sexual Activity  . Alcohol use: No    Comment: 01/07/2015 "glass of wine at Christmas, maybe"  . Drug use: No  . Sexual activity: Never  Other Topics Concern  . Not on file  Social History Narrative   Patient is right handed   Patient drinks 1-2 sodas daily.   patlient lives alone.   Social Determinants of Health   Financial Resource Strain:   . Difficulty of Paying Living Expenses: Not on file  Food Insecurity:   . Worried About Estate manager/land agent  of Food in the Last Year: Not on file  . Ran Out of Food in the Last Year: Not on file  Transportation Needs:   . Lack of Transportation (Medical): Not on file  . Lack of Transportation (Non-Medical): Not on file  Physical Activity: Inactive  . Days of Exercise per Week: 0 days  . Minutes of Exercise per Session: 0 min  Stress: Stress Concern Present  . Feeling of Stress : To some extent  Social Connections: Socially Isolated  . Frequency of Communication with Friends and Family: Three times a week  . Frequency of Social Gatherings with Friends and Family: Once a week  . Attends Religious Services: Never  . Active Member of Clubs or Organizations: No  . Attends Archivist Meetings: Never  . Marital Status: Widowed  Intimate Partner Violence:   . Fear of Current or Ex-Partner: Not on file  . Emotionally Abused: Not on file  . Physically Abused: Not on file  . Sexually Abused: Not on file     Physical Exam   Vitals:   09/08/20 1400 09/08/20 1430  BP: (!) 151/88 105/86  Pulse: 96 75  Resp: 15 18  Temp:    SpO2: 99% 100%    CONSTITUTIONAL: Well-appearing, NAD, seems anxious NEURO:  Alert and oriented x 3, no focal deficits EYES:  eyes equal and reactive ENT/NECK:  no LAD, no  JVD CARDIO: Regular rate, well-perfused, normal S1 and S2 PULM:  CTAB no wheezing or rhonchi GI/GU:  normal bowel sounds, non-distended, non-tender MSK/SPINE:  No gross deformities, no edema SKIN:  no rash, atraumatic PSYCH:  Appropriate speech and behavior  *Additional and/or pertinent findings included in MDM below  Diagnostic and Interventional Summary    EKG Interpretation  Date/Time:  Sunday September 08 2020 14:42:15 EDT Ventricular Rate:  100 PR Interval:    QRS Duration: 80 QT Interval:  306 QTC Calculation: 395 R Axis:   44 Text Interpretation: Atrial fibrillation Low voltage, precordial leads Borderline repolarization abnormality Confirmed by Gerlene Fee [phone]) on 09/08/2020 2:56:32 PM      Labs Reviewed  BASIC METABOLIC PANEL - Abnormal; Notable for the following components:      Result Value   Potassium 3.4 (*)    Glucose, Bld 116 (*)    All other components within normal limits  PROTIME-INR - Abnormal; Notable for the following components:   Prothrombin Time 18.6 (*)    INR 1.6 (*)    All other components within normal limits  DIGOXIN LEVEL - Abnormal; Notable for the following components:   Digoxin Level 0.9 (*)    All other components within normal limits  SARS CORONAVIRUS 2 BY RT PCR (HOSPITAL ORDER, [place])  CBC  TSH  CK  TROPONIN I (HIGH SENSITIVITY)  TROPONIN I (HIGH SENSITIVITY)    DG Chest 2 View  Final Result      Medications  metoprolol tartrate (LOPRESSOR) injection 5 mg (has no administration in time range)  sodium chloride 0.9 % bolus 1,000 mL (1,000 mLs Intravenous New Bag/Given 09/08/20 1306)  haloperidol lactate (HALDOL) injection 2 mg (2 mg Intravenous Given 09/08/20 1345)     Procedures  /  Critical Care .1-3 Lead EKG Interpretation Performed by: Maudie Flakes, MD Authorized by: Maudie Flakes, MD     Interpretation: abnormal     ECG rate:  110   ECG rate assessment: tachycardic     Rhythm:  atrial fibrillation   Comments:  Cardiac monitoring was ordered to monitor the patient for dysrhythmia.  I personally interpreted the patient's cardiac monitor while at the bedside.   .Critical Care Performed by: Maudie Flakes, MD Authorized by: Maudie Flakes, MD   Critical care provider statement:    Critical care time (minutes):  31   Critical care was necessary to treat or prevent imminent or life-threatening deterioration of the following conditions: A. fib with RVR.   Critical care was time spent personally by me on the following activities:  Discussions with consultants, evaluation of patient's response to treatment, examination of patient, ordering and performing treatments and interventions, ordering and review of laboratory studies, ordering and review of radiographic studies, pulse oximetry, re-evaluation of patient's condition, obtaining history from patient or surrogate and review of old charts    ED Course and Medical Decision Making  I have reviewed the triage vital signs, the nursing notes, and pertinent available records from the EMR.  Listed above are laboratory and imaging tests that I personally ordered, reviewed, and interpreted and then considered in my medical decision making (see below for details).  The lower extremities appeared normal, no edema, no erythema, no increased warmth, normal range of motion of all joints.  Nothing to suggest infection or DVT, good cap refill, strong peripheral pulses.  Neurovascularly intact.  Suspect restless leg syndrome, possibly viral illness or Covid given the diffuse body aches.  Feels very cold, also considering thyroid dysfunction.  Also considering supratherapeutic digoxin levels though patient denies visual disturbance.  Awaiting laboratory assessment, will provide IV fluids given her tachycardia and diarrhea as I suspect a component of dehydration.  Will also provide a low-dose of haloperidol for discomfort and mild  agitation.     Work-up is overall reassuring.  Despite fluids patient continues to have some mildly elevated heart rates between 100 and 120.  It seems that her baseline rhythm is A. fib and her baseline rate is in the mid 90s according to recent note by Dr. Burt Knack of cardiology.  Patient's digoxin level is also mildly low.  Will consult cardiology for recommendations with regard to medical management but at this point feel that patient could likely be discharged so long as her rates are reasonably controlled.  Providing single dose 5 mg metoprolol IV while we wait for cardiology recommendations.  Spoke with PA Ahmed Prima who will write a note with recommendations.  Signed out to oncoming provider at shift change.  Barth Kirks. Sedonia Small, [place] [email or web address]  Final Clinical Impressions(s) / ED Diagnoses     ICD-10-CM   1. Restless legs  G25.81   2. Malaise  R53.81   3. Atrial fibrillation with RVR (HCC)  I48.91     ED Discharge Orders    None       Discharge Instructions Discussed with and Provided to Patient:     Discharge Instructions     You were evaluated in the Emergency Department and after careful evaluation, we did not find any emergent condition requiring admission or further testing in the hospital.  Your exam/testing today is overall reassuring.  Please return to the Emergency Department if you experience any worsening of your condition.   Thank you for allowing [place] to be a part of your care.       Maudie Flakes, MD 09/08/20 1505

## 2020-09-09 NOTE — Addendum Note (Signed)
Addended by: Freada Bergeron on: 09/09/2020 09:29 AM   Modules accepted: Orders

## 2020-09-17 ENCOUNTER — Telehealth: Payer: Self-pay | Admitting: Radiology

## 2020-09-17 ENCOUNTER — Other Ambulatory Visit: Payer: Medicare HMO

## 2020-09-17 NOTE — Telephone Encounter (Signed)
Enrolled patient for a 3 day Zio XT monitor to be mailed to patients home. Brief instructions were gone over with the daughter and she knows to expect the monitor to arrive in 3-4 days.

## 2020-09-18 ENCOUNTER — Other Ambulatory Visit: Payer: Medicare HMO

## 2020-09-23 ENCOUNTER — Other Ambulatory Visit: Payer: Self-pay

## 2020-09-23 ENCOUNTER — Encounter: Payer: Self-pay | Admitting: Podiatry

## 2020-09-23 ENCOUNTER — Ambulatory Visit (INDEPENDENT_AMBULATORY_CARE_PROVIDER_SITE_OTHER): Payer: Medicare HMO | Admitting: Podiatry

## 2020-09-23 ENCOUNTER — Other Ambulatory Visit (INDEPENDENT_AMBULATORY_CARE_PROVIDER_SITE_OTHER): Payer: Medicare HMO

## 2020-09-23 DIAGNOSIS — M79674 Pain in right toe(s): Secondary | ICD-10-CM

## 2020-09-23 DIAGNOSIS — B351 Tinea unguium: Secondary | ICD-10-CM

## 2020-09-23 DIAGNOSIS — G609 Hereditary and idiopathic neuropathy, unspecified: Secondary | ICD-10-CM

## 2020-09-23 DIAGNOSIS — Q828 Other specified congenital malformations of skin: Secondary | ICD-10-CM | POA: Diagnosis not present

## 2020-09-23 DIAGNOSIS — I4821 Permanent atrial fibrillation: Secondary | ICD-10-CM | POA: Diagnosis not present

## 2020-09-23 DIAGNOSIS — M79675 Pain in left toe(s): Secondary | ICD-10-CM | POA: Diagnosis not present

## 2020-09-27 NOTE — Progress Notes (Signed)
Subjective: [name] [name] is a pleasant 79 y.o. female patient seen today painful porokeratotic lesion(s) right foot and painful mycotic toenails that limit ambulation. Aggravating factors include weightbearing with and without shoe gear. Pain for both is relieved with periodic professional debridement.   Her daughter is present during today's visit. [name] c/o painful plantar lesion right foot. She has gone a little further than her normal appointment interval.  Daughter states they are using topical antifungal from [place].    Past Medical History:  Diagnosis Date  . Abnormal nuclear cardiac imaging test   . Anemia   . Anemia, iron deficiency 07/02/2015  . Anxiety   . Arthritis    "knees, back, fingers, toes; joints" (01/07/2015)  . 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 CAD (coronary artery disease)    a. Abnl nuc 11/2014. Cath 12/2014 - turned down for CABG. Ultimately s/p TTVP, rotational atherectomy, PTCA and stenting of the ostial LCx and left main into the LAD (crush technique), and IVUS of the LAD/Left main. // b. Myoview 11/17: EF 48, poor quality/significant artifact; inf-lateral, inferior ischemia; Intermediate Risk  . Chronic atrial fibrillation ([place])    a.  First noted post-op 9/15 spinal fusion. She had cardioversion, not on anticoagulation. Fall risk, unsteady. // failed DCCV // Holter 10/17: AFib, Avg HR 97, PVCs, no other arrhythmia  . Chronic back pain greater than 3 months duration    a. spinal stenosis. Spinal fusion with rods in 2/15 at [facility] spinal fusion 9/15.  Marland Kitchen Chronic diastolic CHF    Echo 2/12:  EF 50-55%, trivial AI, midl MR, mod LAE, PASP 37 mmHg // Echocardiogram 03/2020: EF 45-50, no RWMA, mild LVH, mild reduced RVSF, mildly elevated PASP (RVSP 38.3), mild LAE, mild MR, mild to mod TR, trivial AI // Echo 9/21: EF 45, mild LVH, mildly reduced RV SF, moderate LAE, mild RAE, mild MR, mild AI     . Circadian rhythm sleep disorder   . CTS  (carpal tunnel syndrome)   . Deficiency anemia 11/10/2014  . Depression   . Diarrhea   . Diverticulitis of colon   . Esophageal stricture   . Gastritis   . Gastroesophageal hernia 07/06/2013  . GERD (gastroesophageal reflux disease)   . Hiatal hernia   . History of blood transfusion    "most of them related to OR's"   . HTN (hypertension)   . Hyperlipidemia   . IBS (irritable bowel syndrome)   . Incontinence 10/13/2012  . Insomnia   . Ischemic chest pain ([place])   . Memory disorder 12/04/2014  . Metabolic syndrome 01/24/8249  . MGUS (monoclonal gammopathy of unknown significance) dx'd 11/2014   a. Neg BMB 11/2014.  . Multiple falls   . Obesity   . Obstructive sleep apnea    "have mask; don't wear it" (01/07/2015)  . Orthostasis   . PAT (paroxysmal atrial tachycardia) ([place])   . Personal history of colonic polyps 10/25/2011 & 12/02/11   not retrieved Dr Lyla Son & tubular adenomas  . Pneumonia 03/2014  . Sinus bradycardia    a. Baseline HR 50s-60s.  . Stroke [facility]) early 2000's   "small"; denies residual on 01/07/2015)    Patient Active Problem List   Diagnosis Date Noted  . Choking   . Aspiration into airway 07/16/2020  . Acquired trigger finger of right middle finger 07/09/2020  . Osteoarthritis of first carpometacarpal joint of left hand 07/09/2020  . Dysphagia 03/25/2020  . HOH (hard of hearing) 03/25/2020  .  Lichen sclerosus et atrophicus of the vulva 01/03/2019  . Chronic pain syndrome 02/14/2018  . Long-term current use of opiate analgesic 02/14/2018  . Anxiety 07/21/2016  . Chronic diastolic CHF (congestive heart failure) ([place]) 07/17/2015  . DDD (degenerative disc disease), lumbar 03/22/2015  . Fall 03/22/2015  . 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 Difficulty hearing 03/22/2015  . Lumbar scoliosis 03/22/2015  . History of pelvic fracture 03/22/2015  . History of other specified conditions presenting hazards to health 03/22/2015  . MGUS (monoclonal gammopathy of  unknown significance) 01/08/2015  . Permanent atrial fibrillation ([place]) 12/22/2014  . CAD (coronary artery disease) 12/22/2014  . Memory disorder 12/04/2014  . Angulation of spine 09/18/2014  . Metabolic syndrome 02/58/5277  . S/P spinal fusion 02/23/2014  . Chronic pain associated with significant psychosocial dysfunction 02/23/2014  . Gastroesophageal hernia 07/06/2013  . Incontinence 10/13/2012  . 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 DEGENERATIVE JOINT DISEASE 04/08/2010  . Obesity (BMI 30.0-34.9) 04/04/2009  . CIRCADIAN RHYTHM SLEEP D/O DELAY SLEEP PHSE TYPE 04/04/2009  . CARPAL TUNNEL SYNDROME 04/04/2009  . IRRITABLE BOWEL SYNDROME 11/13/2008  . ESOPHAGEAL STRICTURE 11/12/2008  . GERD 11/12/2008  . Paralysis of diaphragm 11/12/2008  . DIVERTICULOSIS, COLON 11/12/2008  . Obstructive sleep apnea 05/23/2008  . Hyperlipidemia 05/22/2008  . Depression 05/22/2008  . Essential hypertension 05/22/2008  . INSOMNIA 05/22/2008    Current Outpatient Medications on File Prior to Visit  Medication Sig Dispense Refill  . albuterol (VENTOLIN HFA) 108 (90 Base) MCG/ACT inhaler Inhale 2 puffs into the lungs every 6 (six) hours as needed for wheezing or shortness of breath. 6.7 g 1  . aspirin EC 81 MG tablet Take 1 tablet (81 mg total) by mouth daily. 90 tablet 3  . Cholecalciferol (VITAMIN D) 2000 units tablet Take 2,000 Units by mouth daily.    . clobetasol cream (TEMOVATE) 8.24 % Apply 1 application topically See admin instructions. Applies to vaginal area twice a week.    . digoxin (LANOXIN) 0.125 MG tablet Take 1 tablet (0.125 mg total) by mouth daily. 30 tablet 11  . ELIQUIS 5 MG TABS tablet Take 1 tablet (5 mg total) by mouth 2 (two) times daily. 180 tablet 2  . Eszopiclone 3 MG TABS Take 1 tablet (3 mg total) by mouth at bedtime. For insomnia,take immediately before bedtime. 90 tablet 1  . fentaNYL (DURAGESIC - DOSED MCG/HR) 50 MCG/HR Place 1 patch (50 mcg total) onto the skin every  3 (three) days. 10 patch 0  . FLUoxetine (PROZAC) 40 MG capsule Take 1 capsule (40 mg total) by mouth daily. 90 capsule 0  . fluticasone (FLONASE) 50 MCG/ACT nasal spray Place 1 spray into both nostrils daily. 30 g 2  . furosemide (LASIX) 80 MG tablet Take 1 tablet (80 mg total) by mouth daily. 90 tablet 0  . guaiFENesin (MUCINEX) 600 MG 12 hr tablet Take 1 tablet (600 mg total) by mouth 2 (two) times daily. 30 tablet 0  . HYDROcodone-acetaminophen (NORCO) 10-325 MG per tablet Take 1 tablet by mouth every 4 (four) hours as needed for moderate pain.     . Multiple Vitamin (MULTIVITAMIN) tablet Take 1 tablet by mouth daily. For supplement    . nitroGLYCERIN (NITROSTAT) 0.4 MG SL tablet Place 1 tablet (0.4 mg total) under the tongue every 5 (five) minutes as needed for chest pain (up to 3 doses). 25 tablet 3  . NONFORMULARY OR COMPOUNDED ITEM Apply 1 application topically daily. Antifungal solution: Terbinafine 3%, Fluconazole 2%, Tea Tree  Oil 5%, Urea 10%, Ibuprofen 2% in DMSO suspension #63mL    . pantoprazole (PROTONIX) 40 MG tablet Take 1 tablet (40 mg total) by mouth 2 (two) times daily. 180 tablet 0  . potassium chloride SA (KLOR-CON) 20 MEQ tablet Take 1 tablet (20 mEq total) by mouth daily. dissolve  tablet on water. 90 tablet 3  . rosuvastatin (CRESTOR) 10 MG tablet Take 1 tablet (10 mg total) by mouth daily. 90 tablet 0  . sucralfate (CARAFATE) 1 GM/10ML suspension Take 10 mLs (1 g total) by mouth 4 (four) times daily -  with meals and at bedtime. 420 mL 0  . triamcinolone cream (KENALOG) 0.1 % Apply 1 application topically 3 (three) times daily.      No current facility-administered medications on file prior to visit.    Allergies  Allergen Reactions  . Buprenorphine Hcl Itching  . Cymbalta [Duloxetine Hcl] Other (See Comments)    Other reaction(s): Other (See Comments) Personality changes - crying  2014  . Morphine And Related Itching  . Oxycodone-Acetaminophen Other (See Comments)     Personality changes   . Valium [Diazepam] Other (See Comments)    Personality changes - "in another world" ;  Hallucinations  2015  . Statins Other (See Comments)    Other reaction(s): Other (See Comments) Muscle weakness Muscle weakness  . Altace [Ramipril] Other (See Comments)    unknown  . Floxin [Ofloxacin] Other (See Comments)    unknown  . Hydromorphone Other (See Comments)    Other reaction(s): Confusion (intolerance) unknown  . Lovaza [Omega-3-Acid Ethyl Esters] Other (See Comments)    Muscle weakness   . Trilipix [Choline Fenofibrate] Other (See Comments)    Muscle weakness   . Zetia [Ezetimibe] Other (See Comments)    Muscle weakness     Objective: Physical Exam General: Devaney W Gangi is a pleasant 79 y.o. Caucasian female, WD, WN in NAD. AAO x 3.   Vascular:  Neurovascular status unchanged b/l lower extremities. Capillary refill time to digits immediate b/l. Palpable DP pulse(s) b/l lower extremities Faintly palpable PT pulse(s) b/l lower extremities. Pedal hair present. Lower extremity skin temperature gradient within normal limits. No pain with calf compression b/l.  Dermatological:  Pedal skin with normal turgor, texture and tone bilaterally. No open wounds bilaterally. No interdigital macerations bilaterally. Toenails 2-5 bilaterally elongated, discolored, dystrophic, thickened, and crumbly with subungual debris and tenderness to dorsal palpation. Anonychia noted L hallux and R hallux. Nailbed(s) epithelialized.  Porokeratotic lesion(s) submet head 5 right foot. No erythema, no edema, no drainage, no fluctuance  Musculoskeletal:  Normal muscle strength 5/5 to all lower extremity muscle groups bilaterally. No pain crepitus or joint limitation noted with ROM b/l. Hammertoes noted to the R 2nd toe.  Neurological:  Protective sensation intact 5/5 intact bilaterally with 10g monofilament b/l. Vibratory sensation diminished b/l.  Assessment and Plan:  1. Pain due  to onychomycosis of toenails of both feet   2. Porokeratosis   3. Idiopathic peripheral neuropathy    -Examined patient. -Continue nonformulary compounding topical antifungal: [facility] to affected toenails. -Toenails 2-5 bilaterally debrided in length and girth without iatrogenic bleeding with sterile nail nipper and dremel.  -Painful porokeratotic lesion(s) submet head 5 right foot pared and enucleated with sterile scalpel blade without incident. -Patient to report any pedal injuries to medical professional immediately. -Patient to continue soft, supportive shoe gear daily. -Patient/POA to call should there be question/concern in the interim.  Return in about 3 months (around 12/24/2020).  Marzetta Board, DPM

## 2020-10-01 ENCOUNTER — Ambulatory Visit: Payer: Medicare HMO | Admitting: Family Medicine

## 2020-10-01 ENCOUNTER — Other Ambulatory Visit: Payer: Self-pay

## 2020-10-01 ENCOUNTER — Encounter: Payer: Self-pay | Admitting: Family Medicine

## 2020-10-01 VITALS — BP 101/59 | HR 86 | Temp 97.2°F | Ht 67.0 in | Wt 174.2 lb

## 2020-10-01 DIAGNOSIS — M25552 Pain in left hip: Secondary | ICD-10-CM

## 2020-10-01 DIAGNOSIS — Z23 Encounter for immunization: Secondary | ICD-10-CM

## 2020-10-01 DIAGNOSIS — Z5181 Encounter for therapeutic drug level monitoring: Secondary | ICD-10-CM

## 2020-10-01 DIAGNOSIS — Z79899 Other long term (current) drug therapy: Secondary | ICD-10-CM

## 2020-10-01 DIAGNOSIS — L659 Nonscarring hair loss, unspecified: Secondary | ICD-10-CM

## 2020-10-01 DIAGNOSIS — I4821 Permanent atrial fibrillation: Secondary | ICD-10-CM

## 2020-10-01 DIAGNOSIS — D472 Monoclonal gammopathy: Secondary | ICD-10-CM | POA: Diagnosis not present

## 2020-10-01 NOTE — Patient Instructions (Signed)
It is possible for digoxin to cause sweating. We should certainly get a level to make sure she doesn't have too much in her blood  You had labs performed today.  You will be contacted with the results of the labs once they are available, usually in the next 3 business days for routine lab work.  If you have an active my chart account, they will be released to your MyChart.  If you prefer to have these labs released to you via telephone, please let us know.  If you had a pap smear or biopsy performed, expect to be contacted in about 7-10 days.

## 2020-10-01 NOTE — Progress Notes (Signed)
Subjective: CC: f/u A. fib, chronic pain syndrome PCP: [name], DO HFS:FSELTRVU W Ragan is a 79 y.o. female presenting to clinic today for:  1.  A. Fib, permanent Patient reports compliance with Eliquis, Digoxin (had a lapse and therefore labs were not obtained at Cards f/u).  She has had quite a bit of bruising along her face after a tooth extraction.  Otherwise denies any hematochezia, melena, epistaxis or vaginal bleeding.  She has had some flushing and night sweats that have been ongoing since initiation of the digoxin.  She also reports some hair thinning.  2.  MGUS Diagnosed by Dr. Burr Medico for this but she was released to PCP for ongoing monitoring of labs.    Wants to have labs done.  She reports thinning of hair.  3.  Chronic pain syndrome Patient reports being managed by her specialist.  She continues to be treated with fentanyl and hydrocodone.  She continues to have quite a bit of left-sided hip and buttock pain.  Apparently one of her hardware's were compromised but they cannot operate due to heart condition.    ROS: Per HPI  Allergies  Allergen Reactions  . Buprenorphine Hcl Itching  . Cymbalta [Duloxetine Hcl] Other (See Comments)    Other reaction(s): Other (See Comments) Personality changes - crying  2014  . Morphine And Related Itching  . Oxycodone-Acetaminophen Other (See Comments)    Personality changes   . Valium [Diazepam] Other (See Comments)    Personality changes - "in another world" ;  Hallucinations  2015  . Statins Other (See Comments)    Other reaction(s): Other (See Comments) Muscle weakness Muscle weakness  . Altace [Ramipril] Other (See Comments)    unknown  . Floxin [Ofloxacin] Other (See Comments)    unknown  . Hydromorphone Other (See Comments)    Other reaction(s): Confusion (intolerance) unknown  . Lovaza [Omega-3-Acid Ethyl Esters] Other (See Comments)    Muscle weakness   . Trilipix [Choline Fenofibrate] Other (See  Comments)    Muscle weakness   . Zetia [Ezetimibe] Other (See Comments)    Muscle weakness    Past Medical History:  Diagnosis Date  . Abnormal nuclear cardiac imaging test   . Anemia   . Anemia, iron deficiency 07/02/2015  . Anxiety   . Arthritis    "knees, back, fingers, toes; joints" (01/07/2015)  . 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 CAD (coronary artery disease)    a. Abnl nuc 11/2014. Cath 12/2014 - turned down for CABG. Ultimately s/p TTVP, rotational atherectomy, PTCA and stenting of the ostial LCx and left main into the LAD (crush technique), and IVUS of the LAD/Left main. // b. Myoview 11/17: EF 48, poor quality/significant artifact; inf-lateral, inferior ischemia; Intermediate Risk  . Chronic atrial fibrillation ([place])    a.  First noted post-op 9/15 spinal fusion. She had cardioversion, not on anticoagulation. Fall risk, unsteady. // failed DCCV // Holter 10/17: AFib, Avg HR 97, PVCs, no other arrhythmia  . Chronic back pain greater than 3 months duration    a. spinal stenosis. Spinal fusion with rods in 2/15 at [facility] spinal fusion 9/15.  Marland Kitchen Chronic diastolic CHF    Echo 0/23:  EF 50-55%, trivial AI, midl MR, mod LAE, PASP 37 mmHg // Echocardiogram 03/2020: EF 45-50, no RWMA, mild LVH, mild reduced RVSF, mildly elevated PASP (RVSP 38.3), mild LAE, mild MR, mild to mod TR, trivial AI // Echo 9/21: EF 45, mild LVH, mildly reduced RV SF, moderate  LAE, mild RAE, mild MR, mild AI     . Circadian rhythm sleep disorder   . CTS (carpal tunnel syndrome)   . Deficiency anemia 11/10/2014  . Depression   . Diarrhea   . Diverticulitis of colon   . Esophageal stricture   . Gastritis   . Gastroesophageal hernia 07/06/2013  . GERD (gastroesophageal reflux disease)   . Hiatal hernia   . History of blood transfusion    "most of them related to OR's"   . HTN (hypertension)   . Hyperlipidemia   . IBS (irritable bowel syndrome)   . Incontinence 10/13/2012  . Insomnia   . Ischemic  chest pain ([place])   . Memory disorder 12/04/2014  . Metabolic syndrome 0/03/5408  . MGUS (monoclonal gammopathy of unknown significance) dx'd 11/2014   a. Neg BMB 11/2014.  . Multiple falls   . Obesity   . Obstructive sleep apnea    "have mask; don't wear it" (01/07/2015)  . Orthostasis   . PAT (paroxysmal atrial tachycardia) ([place])   . Personal history of colonic polyps 10/25/2011 & 12/02/11   not retrieved Dr Lyla Son & tubular adenomas  . Pneumonia 03/2014  . Sinus bradycardia    a. Baseline HR 50s-60s.  . Stroke [facility]) early 2000's   "small"; denies residual on 01/07/2015)    Current Outpatient Medications:  .  albuterol (VENTOLIN HFA) 108 (90 Base) MCG/ACT inhaler, Inhale 2 puffs into the lungs every 6 (six) hours as needed for wheezing or shortness of breath., Disp: 6.7 g, Rfl: 1 .  aspirin EC 81 MG tablet, Take 1 tablet (81 mg total) by mouth daily., Disp: 90 tablet, Rfl: 3 .  Cholecalciferol (VITAMIN D) 2000 units tablet, Take 2,000 Units by mouth daily., Disp: , Rfl:  .  clobetasol cream (TEMOVATE) 8.11 %, Apply 1 application topically See admin instructions. Applies to vaginal area twice a week., Disp: , Rfl:  .  digoxin (LANOXIN) 0.125 MG tablet, Take 1 tablet (0.125 mg total) by mouth daily., Disp: 30 tablet, Rfl: 11 .  ELIQUIS 5 MG TABS tablet, Take 1 tablet (5 mg total) by mouth 2 (two) times daily., Disp: 180 tablet, Rfl: 2 .  Eszopiclone 3 MG TABS, Take 1 tablet (3 mg total) by mouth at bedtime. For insomnia,take immediately before bedtime., Disp: 90 tablet, Rfl: 1 .  fentaNYL (DURAGESIC - DOSED MCG/HR) 50 MCG/HR, Place 1 patch (50 mcg total) onto the skin every 3 (three) days., Disp: 10 patch, Rfl: 0 .  FLUoxetine (PROZAC) 40 MG capsule, Take 1 capsule (40 mg total) by mouth daily., Disp: 90 capsule, Rfl: 0 .  fluticasone (FLONASE) 50 MCG/ACT nasal spray, Place 1 spray into both nostrils daily., Disp: 30 g, Rfl: 2 .  furosemide (LASIX) 80 MG tablet, Take 1 tablet (80 mg  total) by mouth daily., Disp: 90 tablet, Rfl: 0 .  guaiFENesin (MUCINEX) 600 MG 12 hr tablet, Take 1 tablet (600 mg total) by mouth 2 (two) times daily., Disp: 30 tablet, Rfl: 0 .  HYDROcodone-acetaminophen (NORCO) 10-325 MG per tablet, Take 1 tablet by mouth every 4 (four) hours as needed for moderate pain. , Disp: , Rfl:  .  Multiple Vitamin (MULTIVITAMIN) tablet, Take 1 tablet by mouth daily. For supplement, Disp: , Rfl:  .  nitroGLYCERIN (NITROSTAT) 0.4 MG SL tablet, Place 1 tablet (0.4 mg total) under the tongue every 5 (five) minutes as needed for chest pain (up to 3 doses)., Disp: 25 tablet, Rfl: 3 .  NONFORMULARY  OR COMPOUNDED ITEM, Apply 1 application topically daily. Antifungal solution: Terbinafine 3%, Fluconazole 2%, Tea Tree Oil 5%, Urea 10%, Ibuprofen 2% in DMSO suspension #76m, Disp: , Rfl:  .  pantoprazole (PROTONIX) 40 MG tablet, Take 1 tablet (40 mg total) by mouth 2 (two) times daily., Disp: 180 tablet, Rfl: 0 .  potassium chloride SA (KLOR-CON) 20 MEQ tablet, Take 1 tablet (20 mEq total) by mouth daily. dissolve  tablet on water., Disp: 90 tablet, Rfl: 3 .  rosuvastatin (CRESTOR) 10 MG tablet, Take 1 tablet (10 mg total) by mouth daily., Disp: 90 tablet, Rfl: 0 .  sucralfate (CARAFATE) 1 GM/10ML suspension, Take 10 mLs (1 g total) by mouth 4 (four) times daily -  with meals and at bedtime., Disp: 420 mL, Rfl: 0 .  triamcinolone cream (KENALOG) 0.1 %, Apply 1 application topically 3 (three) times daily. , Disp: , Rfl:  Social History   Socioeconomic History  . Marital status: Widowed    Spouse name: Not on file  . Number of children: 2  . Years of education: HS  . Highest education level: Not on file  Occupational History  . Occupation: lLicensed conveyancer retired    Comment: retired  Tobacco Use  . Smoking status: Never Smoker  . Smokeless tobacco: Never Used  Vaping Use  . Vaping Use: Never used  Substance and Sexual Activity  . Alcohol use: No    Comment: 01/07/2015 "glass  of wine at Christmas, maybe"  . Drug use: No  . Sexual activity: Never  Other Topics Concern  . Not on file  Social History Narrative   Patient is right handed   Patient drinks 1-2 sodas daily.   patlient lives alone.   Social Determinants of Health   Financial Resource Strain:   . Difficulty of Paying Living Expenses: Not on file  Food Insecurity:   . Worried About RCharity fundraiserin the Last Year: Not on file  . Ran Out of Food in the Last Year: Not on file  Transportation Needs:   . Lack of Transportation (Medical): Not on file  . Lack of Transportation (Non-Medical): Not on file  Physical Activity: Inactive  . Days of Exercise per Week: 0 days  . Minutes of Exercise per Session: 0 min  Stress: Stress Concern Present  . Feeling of Stress : To some extent  Social Connections: Socially Isolated  . Frequency of Communication with Friends and Family: Three times a week  . Frequency of Social Gatherings with Friends and Family: Once a week  . Attends Religious Services: Never  . Active Member of Clubs or Organizations: No  . Attends CArchivistMeetings: Never  . Marital Status: Widowed  Intimate Partner Violence:   . Fear of Current or Ex-Partner: Not on file  . Emotionally Abused: Not on file  . Physically Abused: Not on file  . Sexually Abused: Not on file   Family History  Problem Relation Age of Onset  . Coronary artery disease Father   . Peripheral vascular disease Father   . Coronary artery disease Mother   . Coronary artery disease Brother   . Colon cancer Sister   . Emphysema Sister   . Sleep apnea Son   . Colon cancer Sister        spread to her brain  . Emphysema Brother   . Dementia Neg Hx     Objective: Office vital signs reviewed. BP (!) 101/59   Pulse 86   Temp (!)  97.2 F (36.2 C)   Ht 5' 7"  (1.702 m)   Wt 174 lb 3.2 oz (79 kg)   SpO2 93%   BMI 27.28 kg/m   Physical Examination:  General: Awake, alert, No acute  distress HEENT: Normal, sclera white, MMM.  No evidence of fungal infection of the scalp.  Hair does not appear to be thin.  No findings of alopecia appreciated Cardio: irregularly irregular, S1S2 heard, no murmurs appreciated Pulm: clear to auscultation bilaterally, no wheezes, rhonchi or rales; normal work of breathing on room air Extremities: Warm. MSK: slow gait and hunched station; uses rolling walker for ambulation  Assessment/ Plan: 78 y.o. female   1. Permanent atrial fibrillation (HCC) Rate controlled.  Last digoxin level was subtherapeutic.  She has an active order from her cardiologist in the EMR.  CMP, thyroid panel and anemia panel also ordered - CMP14+EGFR - Thyroid Panel With TSH - Anemia panel - Digoxin level  2. Encounter for monitoring digoxin therapy - Digoxin level  3. Left hip pain Managed by orthopedics  4. MGUS (monoclonal gammopathy of unknown significance) Released from hematology/oncology care.  Recommended ongoing monitoring for anemia, renal insufficiency and hypercalcemia.  If she were to develop these, she would be referred back to the oncologist - CMP14+EGFR - Anemia panel  5. Hair thinning Check thyroid.  Recommended biotin or hair skin and nail vitamin - Thyroid Panel With TSH  6. Need for immunization against influenza Administered - Flu Vaccine QUAD High Dose(Fluad)  7. Need for vaccination against Streptococcus pneumoniae Administered - Pneumococcal polysaccharide vaccine 23-valent greater than or equal to 2yo subcutaneous/IM   No orders of the defined types were placed in this encounter.  No orders of the defined types were placed in this encounter.    [name], DO [place] [phone]

## 2020-10-02 ENCOUNTER — Other Ambulatory Visit: Payer: Self-pay

## 2020-10-02 DIAGNOSIS — Z5181 Encounter for therapeutic drug level monitoring: Secondary | ICD-10-CM

## 2020-10-02 DIAGNOSIS — Z79899 Other long term (current) drug therapy: Secondary | ICD-10-CM

## 2020-10-02 LAB — CMP14+EGFR
ALT: 27 IU/L (ref 0–32)
AST: 32 IU/L (ref 0–40)
Albumin/Globulin Ratio: 1.9 (ref 1.2–2.2)
Albumin: 3.9 g/dL (ref 3.6–4.6)
Alkaline Phosphatase: 125 IU/L — ABNORMAL HIGH (ref 44–121)
BUN/Creatinine Ratio: 10 — ABNORMAL LOW (ref 12–28)
BUN: 8 mg/dL (ref 8–27)
Bilirubin Total: 0.5 mg/dL (ref 0.0–1.2)
CO2: 29 mmol/L (ref 20–29)
Calcium: 9.1 mg/dL (ref 8.7–10.3)
Chloride: 99 mmol/L (ref 96–106)
Creatinine, Ser: 0.8 mg/dL (ref 0.57–1.00)
GFR calc Af Amer: 80 mL/min/{1.73_m2} (ref >59)
GFR calc non Af Amer: 69 mL/min/{1.73_m2} (ref >59)
Globulin, Total: 2.1 g/dL (ref 1.5–4.5)
Glucose: 107 mg/dL — ABNORMAL HIGH (ref 65–99)
Potassium: 3.7 mmol/L (ref 3.5–5.2)
Sodium: 141 mmol/L (ref 134–144)
Total Protein: 6 g/dL (ref 6.0–8.5)

## 2020-10-02 LAB — ANEMIA PANEL
Ferritin: 109 ng/mL (ref 15–150)
Hematocrit: 39.4 % (ref 34.0–46.6)
Iron Saturation: 28 % (ref 15–55)
Iron: 77 ug/dL (ref 27–139)
Retic Ct Pct: 1.5 % (ref 0.6–2.6)
Total Iron Binding Capacity: 280 ug/dL (ref 250–450)
UIBC: 203 ug/dL (ref 118–369)
Vitamin B-12: 786 pg/mL (ref 232–1245)

## 2020-10-02 LAB — THYROID PANEL WITH TSH
Free Thyroxine Index: 2.3 (ref 1.2–4.9)
T3 Uptake Ratio: 30 % (ref 24–39)
T4, Total: 7.7 ug/dL (ref 4.5–12.0)
TSH: 1.68 u[IU]/mL (ref 0.450–4.500)

## 2020-10-02 LAB — DIGOXIN LEVEL: Digoxin, Serum: 1 ng/mL — ABNORMAL HIGH (ref 0.5–0.9)

## 2020-10-02 LAB — SPECIMEN STATUS REPORT

## 2020-10-03 ENCOUNTER — Other Ambulatory Visit: Payer: Medicare HMO

## 2020-10-03 ENCOUNTER — Other Ambulatory Visit: Payer: Self-pay

## 2020-10-03 ENCOUNTER — Telehealth: Payer: Self-pay

## 2020-10-03 DIAGNOSIS — Z79899 Other long term (current) drug therapy: Secondary | ICD-10-CM

## 2020-10-04 ENCOUNTER — Encounter: Payer: Self-pay | Admitting: Physician Assistant

## 2020-10-04 DIAGNOSIS — I4821 Permanent atrial fibrillation: Secondary | ICD-10-CM

## 2020-10-04 LAB — DIGOXIN LEVEL: Digoxin, Serum: 0.5 ng/mL (ref 0.5–0.9)

## 2020-10-04 MED ORDER — DIGOXIN 125 MCG PO TABS: 0.1250 mg | ORAL_TABLET | ORAL | 11 refills | Status: DC

## 2020-10-04 NOTE — Telephone Encounter (Signed)
Patient's daughter states she is returning a call from this morning about her mychart message.

## 2020-10-04 NOTE — Telephone Encounter (Signed)
This encounter was created in error - please disregard.

## 2020-10-04 NOTE — Telephone Encounter (Addendum)
Please refer to EP for AFib. If possible, get her in soon given her intolerance to medications.   She has difficulty with rate control and cannot tolerate any rate controlling medications. Diltiazem was stopped due to worsening LVF. Also, reduce Digoxin to every other day for now.  Thanks, Richardson Dopp, PA-C    10/04/2020 2:07 PM

## 2020-10-04 NOTE — Telephone Encounter (Signed)
Spoke with the patient and her daughter.  The patient will decrease Digoxin to qod. EP referral placed. They were grateful for assistance.

## 2020-10-09 ENCOUNTER — Encounter: Payer: Self-pay | Admitting: Cardiology

## 2020-10-09 ENCOUNTER — Other Ambulatory Visit: Payer: Self-pay

## 2020-10-09 ENCOUNTER — Ambulatory Visit: Payer: Medicare HMO | Admitting: Cardiology

## 2020-10-09 VITALS — BP 118/78 | HR 101 | Ht 67.0 in | Wt 174.0 lb

## 2020-10-09 DIAGNOSIS — I4821 Permanent atrial fibrillation: Secondary | ICD-10-CM | POA: Diagnosis not present

## 2020-10-09 DIAGNOSIS — G894 Chronic pain syndrome: Secondary | ICD-10-CM

## 2020-10-09 MED ORDER — DILTIAZEM HCL ER COATED BEADS 120 MG PO CP24: 120.0000 mg | ORAL_CAPSULE | Freq: Two times a day (BID) | ORAL | 3 refills | Status: DC

## 2020-10-09 NOTE — Patient Instructions (Addendum)
Medication Instructions:  Your physician has recommended you make the following change in your medication:   1.  START diltiazem ER 120 mg- One capsule by mouth twice a day  Labwork: None ordered.  Testing/Procedures: None ordered.  Follow-Up: Your physician wants you to follow-up in: 3 months with Dr. Quentin Ore.    January 16, 2020 at 1:30 pm at the [facility] office   Any Other Special Instructions Will Be Listed Below (If Applicable).  If you need a refill on your cardiac medications before your next appointment, please call your pharmacy.

## 2020-10-09 NOTE — Progress Notes (Signed)
Electrophysiology Office Note:    Date:  10/09/2020   ID:  [name] [name], DOB 1941-12-12, MRN [id number]  PCP:  [name], DO  [facility] Cardiologist:  Sherren Mocha, MD  [facility] Electrophysiologist:  None   Referring MD: Liliane Shi, PA-C   Chief Complaint: Atrial fibrillation   History of Present Illness:    [name] [name] is a 79 y.o. female who presents for an evaluation of permanent atrial fibrillation at the request of Richardson Dopp, PA-C. Their medical history includes MGUS, chronic pain syndrome related to prior back surgery.  She is highly symptomatic with her atrial fibrillation.  She previously was well controlled on diltiazem 120 mg twice daily but this was stopped given the concern that her ejection fraction may have decreased.  She was transitioned over to digoxin and metoprolol but unfortunately has not tolerated these medications well.  Past Medical History:  Diagnosis Date  . Abnormal nuclear cardiac imaging test   . Anemia   . Anemia, iron deficiency 07/02/2015  . Anxiety   . Arthritis    "knees, back, fingers, toes; joints" (01/07/2015)  . 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 CAD (coronary artery disease)    a. Abnl nuc 11/2014. Cath 12/2014 - turned down for CABG. Ultimately s/p TTVP, rotational atherectomy, PTCA and stenting of the ostial LCx and left main into the LAD (crush technique), and IVUS of the LAD/Left main. // b. Myoview 11/17: EF 48, poor quality/significant artifact; inf-lateral, inferior ischemia; Intermediate Risk  . Chronic atrial fibrillation ([place])    a.  First noted post-op 9/15 spinal fusion. She had cardioversion, not on anticoagulation. Fall risk, unsteady. // failed DCCV // Holter 10/17: AFib, Avg HR 97, PVCs, no other arrhythmia  . Chronic back pain greater than 3 months duration    a. spinal stenosis. Spinal fusion with rods in 2/15 at [facility] spinal fusion 9/15.  Marland Kitchen Chronic diastolic CHF    Echo 7/01:  EF  50-55%, trivial AI, midl MR, mod LAE, PASP 37 mmHg // Echocardiogram 03/2020: EF 45-50, no RWMA, mild LVH, mild reduced RVSF, mildly elevated PASP (RVSP 38.3), mild LAE, mild MR, mild to mod TR, trivial AI // Echo 9/21: EF 45, mild LVH, mildly reduced RV SF, moderate LAE, mild RAE, mild MR, mild AI     . Circadian rhythm sleep disorder   . CTS (carpal tunnel syndrome)   . Deficiency anemia 11/10/2014  . Depression   . Diarrhea   . Diverticulitis of colon   . Esophageal stricture   . Gastritis   . Gastroesophageal hernia 07/06/2013  . GERD (gastroesophageal reflux disease)   . Hiatal hernia   . History of blood transfusion    "most of them related to OR's"   . HTN (hypertension)   . Hyperlipidemia   . IBS (irritable bowel syndrome)   . Incontinence 10/13/2012  . Insomnia   . Ischemic chest pain ([place])   . Memory disorder 12/04/2014  . Metabolic syndrome 06/26/9389  . MGUS (monoclonal gammopathy of unknown significance) dx'd 11/2014   a. Neg BMB 11/2014.  . Multiple falls   . Obesity   . Obstructive sleep apnea    "have mask; don't wear it" (01/07/2015)  . Orthostasis   . PAT (paroxysmal atrial tachycardia) ([place])   . Personal history of colonic polyps 10/25/2011 & 12/02/11   not retrieved Dr Lyla Son & tubular adenomas  . Pneumonia 03/2014  . Sinus bradycardia    a. Baseline HR  50s-60s.  . Stroke [facility]) early 2000's   "small"; denies residual on 01/07/2015)    Past Surgical History:  Procedure Laterality Date  . BACK SURGERY    . BONE MARROW BIOPSY  11/2014  . CARDIAC CATHETERIZATION  12/27/2014   Procedure: INTRAVASCULAR PRESSURE WIRE/FFR STUDY;  Surgeon: Larey Dresser, MD;  Location: [facility] CATH LAB;  Service: Cardiovascular;;  . CARDIAC CATHETERIZATION N/A 11/25/2016   Procedure: Left Heart Cath and Coronary Angiography;  Surgeon: Sherren Mocha, MD;  Location: [place] CV LAB;  Service: Cardiovascular;  Laterality: N/A;  . CARDIOVERSION N/A 02/13/2016   Procedure:  CARDIOVERSION;  Surgeon: Josue Hector, MD;  Location: [facility] ENDOSCOPY;  Service: Cardiovascular;  Laterality: N/A;  . CARPAL TUNNEL RELEASE Right 1980's  . CATARACT EXTRACTION Bilateral   . CATARACT EXTRACTION W/ INTRAOCULAR LENS  IMPLANT, BILATERAL  2000's  . CORONARY ANGIOPLASTY WITH STENT PLACEMENT  01/07/2015   "2"  . CORONARY STENT PLACEMENT    . DILATION AND CURETTAGE OF UTERUS    . ESOPHAGOGASTRODUODENOSCOPY (EGD) WITH ESOPHAGEAL DILATION  "several times"  . JOINT REPLACEMENT    . KNEE ARTHROSCOPY Left 1995  . LAPAROSCOPIC CHOLECYSTECTOMY  2003  . LEFT HEART CATHETERIZATION WITH CORONARY ANGIOGRAM N/A 12/27/2014   Procedure: LEFT HEART CATHETERIZATION WITH CORONARY ANGIOGRAM;  Surgeon: Larey Dresser, MD;  Location: [facility] CATH LAB;  Service: Cardiovascular;  Laterality: N/A;  . PERCUTANEOUS CORONARY ROTOBLATOR INTERVENTION (PCI-R)  01/07/2015  . PERCUTANEOUS CORONARY ROTOBLATOR INTERVENTION (PCI-R) N/A 01/07/2015   Procedure: PERCUTANEOUS CORONARY ROTOBLATOR INTERVENTION (PCI-R);  Surgeon: Blane Ohara, MD;  Location: [facility] CATH LAB;  Service: Cardiovascular;  Laterality: N/A;  . POSTERIOR FUSION THORACIC SPINE  08/2015  . POSTERIOR LUMBAR FUSION  01/2015  . TOTAL KNEE ARTHROPLASTY Bilateral 1990's - 2000's    Current Medications: No outpatient medications have been marked as taking for the 10/09/20 encounter (Office Visit) with Vickie Epley, MD.     Allergies:   Buprenorphine hcl, Cymbalta [duloxetine hcl], Morphine and related, Oxycodone-acetaminophen, Valium [diazepam], Statins, Altace [ramipril], Floxin [ofloxacin], Hydromorphone, Lovaza [omega-3-acid ethyl esters], Trilipix [choline fenofibrate], and Zetia [ezetimibe]   Social History   Socioeconomic History  . Marital status: Widowed    Spouse name: Not on file  . Number of children: 2  . Years of education: HS  . Highest education level: Not on file  Occupational History  . Occupation: Licensed conveyancer- retired    Comment:  retired  Tobacco Use  . Smoking status: Never Smoker  . Smokeless tobacco: Never Used  Vaping Use  . Vaping Use: Never used  Substance and Sexual Activity  . Alcohol use: No    Comment: 01/07/2015 "glass of wine at Christmas, maybe"  . Drug use: No  . Sexual activity: Never  Other Topics Concern  . Not on file  Social History Narrative   Patient is right handed   Patient drinks 1-2 sodas daily.   patlient lives alone.   Social Determinants of Health   Financial Resource Strain:   . Difficulty of Paying Living Expenses: Not on file  Food Insecurity:   . Worried About Charity fundraiser in the Last Year: Not on file  . Ran Out of Food in the Last Year: Not on file  Transportation Needs:   . Lack of Transportation (Medical): Not on file  . Lack of Transportation (Non-Medical): Not on file  Physical Activity: Inactive  . Days of Exercise per Week: 0 days  . Minutes of Exercise per  Session: 0 min  Stress: Stress Concern Present  . Feeling of Stress : To some extent  Social Connections: Socially Isolated  . Frequency of Communication with Friends and Family: Three times a week  . Frequency of Social Gatherings with Friends and Family: Once a week  . Attends Religious Services: Never  . Active Member of Clubs or Organizations: No  . Attends Archivist Meetings: Never  . Marital Status: Widowed     Family History: The patient's family history includes Colon cancer in her sister and sister; Coronary artery disease in her brother, father, and mother; Emphysema in her brother and sister; Peripheral vascular disease in her father; Sleep apnea in her son. There is no history of Dementia.  ROS:   Please see the history of present illness.    All other systems reviewed and are negative.  EKGs/Labs/Other Studies Reviewed:    The following studies were reviewed today: Echocardiograms from August 2021, April 2021 and December 2019  Echocardiogram side-by-side comparison  personally performed shows overall stable left ventricular function.  Left ventricular function is low normal or mildly reduced in the 45 to 50% range.  I do not see a significant decrease or change in the ejection fraction over the course of these 3 studies.  EKG:  The ekg ordered today demonstrates atrial fibrillation  Recent Labs: 07/17/2020: Magnesium 2.1 09/08/2020: Hemoglobin 13.0; Platelets 208 10/01/2020: ALT 27; BUN 8; Creatinine, Ser 0.80; Potassium 3.7; Sodium 141; TSH 1.680  Recent Lipid Panel    Component Value Date/Time   CHOL 138 07/20/2019 1252   CHOL 156 05/11/2013 1536   TRIG 123 07/20/2019 1252   TRIG 121 02/18/2016 1504   TRIG 239 (H) 05/11/2013 1536   HDL 61 07/20/2019 1252   HDL 57 02/18/2016 1504   HDL 43 05/11/2013 1536   CHOLHDL 2.3 07/20/2019 1252   LDLCALC 52 07/20/2019 1252   LDLCALC 50 08/21/2014 1141   LDLCALC 65 05/11/2013 1536    Physical Exam:    VS:  BP 118/78   Pulse (!) 101   Ht 5' 7"  (1.702 m)   Wt 174 lb (78.9 kg)   SpO2 97%   BMI 27.25 kg/m     Wt Readings from Last 3 Encounters:  10/09/20 174 lb (78.9 kg)  10/01/20 174 lb 3.2 oz (79 kg)  08/14/20 180 lb 9.6 oz (81.9 kg)     GEN:  Well nourished, appears to be in pain HEENT: Normal NECK: No JVD; No carotid bruits LYMPHATICS: No lymphadenopathy CARDIAC: Irregularly irregular, no murmurs, rubs, gallops RESPIRATORY:  Clear to auscultation without rales, wheezing or rhonchi  ABDOMEN: Soft, non-tender, non-distended MUSCULOSKELETAL:  No edema; No deformity  SKIN: Warm and dry NEUROLOGIC:  Alert and oriented x 3 PSYCHIATRIC:  Normal affect   ASSESSMENT:    1. Permanent atrial fibrillation (HCC)    PLAN:    In order of problems listed above:  1. Permanent atrial fibrillation On Eliquis given her CHA2DS2-VASc of at least 4.  She had decent control of her atrial fibrillation on diltiazem 120 mg twice daily in the past but this was stopped due to a concern for possible decreased  ejection fraction.  On my direct review of the echocardiograms for the past several years I do not see a significant change in the left ventricular function.  The left ventricular function appears to be on the low side of normal or mildly reduced.  Most likely cause of any reduction in her ejection fraction  would be her uncontrolled atrial fibrillation.  Thus, I have recommended to the patient that she stop the digoxin and metoprolol and restart her previous diltiazem prescription.  I had a discussion with the patient and her daughter who is with her today that if she experiences any symptoms of heart failure she should let us know.     2.  Chronic pain syndrome Related to previous back operation and broken hardware. This seems to be a major contributor to her discomfort and reduced quality of life.    Medication Adjustments/Labs and Tests Ordered: Current medicines are reviewed at length with the patient today.  Concerns regarding medicines are outlined above.  Orders Placed This Encounter  Procedures  . EKG 12-Lead   Meds ordered this encounter  Medications  . diltiazem (CARDIZEM CD) 120 MG 24 hr capsule    Sig: Take 1 capsule (120 mg total) by mouth in the morning and at bedtime.    Dispense:  180 capsule    Refill:  3     Signed, Lars Mage, MD, [facility]  10/09/2020 8:21 PM    Electrophysiology [place]

## 2020-11-04 ENCOUNTER — Ambulatory Visit: Payer: Medicare HMO | Admitting: Podiatry

## 2020-11-26 ENCOUNTER — Encounter: Payer: Self-pay | Admitting: Podiatry

## 2020-11-26 ENCOUNTER — Ambulatory Visit: Payer: Medicare HMO | Admitting: Podiatry

## 2020-11-26 ENCOUNTER — Other Ambulatory Visit: Payer: Self-pay

## 2020-11-26 DIAGNOSIS — L900 Lichen sclerosus et atrophicus: Secondary | ICD-10-CM | POA: Insufficient documentation

## 2020-11-26 DIAGNOSIS — M79675 Pain in left toe(s): Secondary | ICD-10-CM

## 2020-11-26 DIAGNOSIS — Q828 Other specified congenital malformations of skin: Secondary | ICD-10-CM | POA: Diagnosis not present

## 2020-11-26 DIAGNOSIS — M79674 Pain in right toe(s): Secondary | ICD-10-CM | POA: Diagnosis not present

## 2020-11-26 DIAGNOSIS — B351 Tinea unguium: Secondary | ICD-10-CM | POA: Diagnosis not present

## 2020-11-26 DIAGNOSIS — G609 Hereditary and idiopathic neuropathy, unspecified: Secondary | ICD-10-CM

## 2020-11-27 ENCOUNTER — Other Ambulatory Visit: Payer: Self-pay | Admitting: Family Medicine

## 2020-11-27 DIAGNOSIS — I480 Paroxysmal atrial fibrillation: Secondary | ICD-10-CM

## 2020-11-27 DIAGNOSIS — I2510 Atherosclerotic heart disease of native coronary artery without angina pectoris: Secondary | ICD-10-CM

## 2020-11-28 ENCOUNTER — Encounter: Payer: Self-pay | Admitting: Family Medicine

## 2020-12-01 NOTE — Progress Notes (Signed)
Subjective: [name] [name] is a pleasant 79 y.o. female patient seen today painful porokeratotic lesion(s) right foot and painful mycotic toenails that limit ambulation. Aggravating factors include weightbearing with and without shoe gear. Pain for both is relieved with periodic professional debridement.   Her daughter is present during today's visit. Daughter states Mom has not complained of pain from callus this interval. They voice no new pedal problems on today's visit.  PCP is Dr. Ronnie Doss. Last visit was 10/01/2020.  Past Medical History:  Diagnosis Date  . Abnormal nuclear cardiac imaging test   . Anemia   . Anemia, iron deficiency 07/02/2015  . Anxiety   . Arthritis    "knees, back, fingers, toes; joints" (01/07/2015)  . 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 CAD (coronary artery disease)    a. Abnl nuc 11/2014. Cath 12/2014 - turned down for CABG. Ultimately s/p TTVP, rotational atherectomy, PTCA and stenting of the ostial LCx and left main into the LAD (crush technique), and IVUS of the LAD/Left main. // b. Myoview 11/17: EF 48, poor quality/significant artifact; inf-lateral, inferior ischemia; Intermediate Risk  . Chronic atrial fibrillation ([place])    a.  First noted post-op 9/15 spinal fusion. She had cardioversion, not on anticoagulation. Fall risk, unsteady. // failed DCCV // Holter 10/17: AFib, Avg HR 97, PVCs, no other arrhythmia  . Chronic back pain greater than 3 months duration    a. spinal stenosis. Spinal fusion with rods in 2/15 at [facility] spinal fusion 9/15.  Marland Kitchen Chronic diastolic CHF    Echo 1/82:  EF 50-55%, trivial AI, midl MR, mod LAE, PASP 37 mmHg // Echocardiogram 03/2020: EF 45-50, no RWMA, mild LVH, mild reduced RVSF, mildly elevated PASP (RVSP 38.3), mild LAE, mild MR, mild to mod TR, trivial AI // Echo 9/21: EF 45, mild LVH, mildly reduced RV SF, moderate LAE, mild RAE, mild MR, mild AI     . Circadian rhythm sleep disorder   . CTS (carpal tunnel  syndrome)   . Deficiency anemia 11/10/2014  . Depression   . Diarrhea   . Diverticulitis of colon   . Esophageal stricture   . Gastritis   . Gastroesophageal hernia 07/06/2013  . GERD (gastroesophageal reflux disease)   . Hiatal hernia   . History of blood transfusion    "most of them related to OR's"   . HTN (hypertension)   . Hyperlipidemia   . IBS (irritable bowel syndrome)   . Incontinence 10/13/2012  . Insomnia   . Ischemic chest pain ([place])   . Memory disorder 12/04/2014  . Metabolic syndrome 08/30/3715  . MGUS (monoclonal gammopathy of unknown significance) dx'd 11/2014   a. Neg BMB 11/2014.  . Multiple falls   . Obesity   . Obstructive sleep apnea    "have mask; don't wear it" (01/07/2015)  . Orthostasis   . PAT (paroxysmal atrial tachycardia) ([place])   . Personal history of colonic polyps 10/25/2011 & 12/02/11   not retrieved Dr Lyla Son & tubular adenomas  . Pneumonia 03/2014  . Sinus bradycardia    a. Baseline HR 50s-60s.  . Stroke [facility]) early 2000's   "small"; denies residual on 01/07/2015)    Patient Active Problem List   Diagnosis Date Noted  . Lichen sclerosus et atrophicus 11/26/2020  . Choking   . Aspiration into airway 07/16/2020  . Acquired trigger finger of right middle finger 07/09/2020  . Osteoarthritis of first carpometacarpal joint of left hand 07/09/2020  . Dysphagia 03/25/2020  .  HOH (hard of hearing) 03/25/2020  . Lichen sclerosus et atrophicus of the vulva 01/03/2019  . Chronic pain syndrome 02/14/2018  . Long-term current use of opiate analgesic 02/14/2018  . Anxiety 07/21/2016  . Chronic diastolic CHF (congestive heart failure) ([place]) 07/17/2015  . DDD (degenerative disc disease), lumbar 03/22/2015  . Fall 03/22/2015  . 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 Difficulty hearing 03/22/2015  . Lumbar scoliosis 03/22/2015  . History of pelvic fracture 03/22/2015  . History of other specified conditions presenting hazards to health 03/22/2015  .  MGUS (monoclonal gammopathy of unknown significance) 01/08/2015  . Permanent atrial fibrillation ([place]) 12/22/2014  . CAD (coronary artery disease) 12/22/2014  . Memory disorder 12/04/2014  . Angulation of spine 09/18/2014  . Metabolic syndrome 35/57/3220  . S/P spinal fusion 02/23/2014  . Chronic pain associated with significant psychosocial dysfunction 02/23/2014  . Gastroesophageal hernia 07/06/2013  . Incontinence 10/13/2012  . 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 DEGENERATIVE JOINT DISEASE 04/08/2010  . Obesity (BMI 30.0-34.9) 04/04/2009  . CIRCADIAN RHYTHM SLEEP D/O DELAY SLEEP PHSE TYPE 04/04/2009  . CARPAL TUNNEL SYNDROME 04/04/2009  . IRRITABLE BOWEL SYNDROME 11/13/2008  . ESOPHAGEAL STRICTURE 11/12/2008  . GERD 11/12/2008  . Paralysis of diaphragm 11/12/2008  . DIVERTICULOSIS, COLON 11/12/2008  . Obstructive sleep apnea 05/23/2008  . Hyperlipidemia 05/22/2008  . Depression 05/22/2008  . Essential hypertension 05/22/2008  . INSOMNIA 05/22/2008    Current Outpatient Medications on File Prior to Visit  Medication Sig Dispense Refill  . albuterol (VENTOLIN HFA) 108 (90 Base) MCG/ACT inhaler Inhale 2 puffs into the lungs every 6 (six) hours as needed for wheezing or shortness of breath. 6.7 g 1  . aspirin EC 81 MG tablet Take 1 tablet (81 mg total) by mouth daily. 90 tablet 3  . Cholecalciferol (VITAMIN D) 2000 units tablet Take 2,000 Units by mouth daily.    . clobetasol cream (TEMOVATE) 2.54 % Apply 1 application topically See admin instructions. Applies to vaginal area twice a week.    . digoxin (LANOXIN) 0.125 MG tablet Take 1 tablet (0.125 mg total) by mouth every other day. 15 tablet 11  . diltiazem (CARDIZEM CD) 120 MG 24 hr capsule Take 1 capsule (120 mg total) by mouth in the morning and at bedtime. 180 capsule 3  . ELIQUIS 5 MG TABS tablet Take 1 tablet (5 mg total) by mouth 2 (two) times daily. 180 tablet 2  . Eszopiclone 3 MG TABS Take 1 tablet (3 mg total) by  mouth at bedtime. For insomnia,take immediately before bedtime. 90 tablet 1  . fentaNYL (DURAGESIC - DOSED MCG/HR) 50 MCG/HR Place 1 patch (50 mcg total) onto the skin every 3 (three) days. 10 patch 0  . fluticasone (FLONASE) 50 MCG/ACT nasal spray Place 1 spray into both nostrils daily. 30 g 2  . furosemide (LASIX) 80 MG tablet Take 1 tablet (80 mg total) by mouth daily. 90 tablet 0  . HYDROcodone-acetaminophen (NORCO) 10-325 MG per tablet Take 1 tablet by mouth every 4 (four) hours as needed for moderate pain.     . Multiple Vitamin (MULTIVITAMIN) tablet Take 1 tablet by mouth daily. For supplement    . nitroGLYCERIN (NITROSTAT) 0.4 MG SL tablet Place 1 tablet (0.4 mg total) under the tongue every 5 (five) minutes as needed for chest pain (up to 3 doses). 25 tablet 3  . NONFORMULARY OR COMPOUNDED ITEM Apply 1 application topically daily. Antifungal solution: Terbinafine 3%, Fluconazole 2%, Tea Tree Oil 5%, Urea 10%, Ibuprofen 2% in  DMSO suspension #14mL    . potassium chloride SA (KLOR-CON) 20 MEQ tablet Take 1 tablet (20 mEq total) by mouth daily. dissolve  tablet on water. 90 tablet 3  . rosuvastatin (CRESTOR) 10 MG tablet Take 1 tablet (10 mg total) by mouth daily. 90 tablet 0  . triamcinolone cream (KENALOG) 0.1 % Apply 1 application topically 3 (three) times daily.      No current facility-administered medications on file prior to visit.    Allergies  Allergen Reactions  . Buprenorphine Hcl Itching  . Cymbalta [Duloxetine Hcl] Other (See Comments)    Other reaction(s): Other (See Comments) Personality changes - crying  2014  . Morphine And Related Itching  . Oxycodone-Acetaminophen Other (See Comments)    Personality changes   . Valium [Diazepam] Other (See Comments)    Personality changes - "in another world" ;  Hallucinations  2015  . Statins Other (See Comments)    Other reaction(s): Other (See Comments) Muscle weakness Muscle weakness  . Altace [Ramipril] Other (See Comments)     unknown  . Floxin [Ofloxacin] Other (See Comments)    unknown  . Hydromorphone Other (See Comments)    Other reaction(s): Confusion (intolerance) unknown  . Lovaza [Omega-3-Acid Ethyl Esters] Other (See Comments)    Muscle weakness   . Trilipix [Choline Fenofibrate] Other (See Comments)    Muscle weakness   . Zetia [Ezetimibe] Other (See Comments)    Muscle weakness     Objective: Physical Exam General: [name] [name] is a pleasant 79 y.o. Caucasian female, WD, WN in NAD. AAO x 3.   Vascular:  Neurovascular status unchanged b/l lower extremities. Capillary refill time to digits immediate b/l. Palpable DP pulse(s) b/l lower extremities Faintly palpable PT pulse(s) b/l lower extremities. Pedal hair present. Lower extremity skin temperature gradient within normal limits. No pain with calf compression b/l.  Dermatological:  Pedal skin with normal turgor, texture and tone bilaterally. No open wounds bilaterally. No interdigital macerations bilaterally. Toenails 2-5 bilaterally elongated, discolored, dystrophic, thickened, and crumbly with subungual debris and tenderness to dorsal palpation. Anonychia noted L hallux and R hallux. Nailbed(s) epithelialized.  Porokeratotic lesion(s) submet head 5 right foot. No erythema, no edema, no drainage, no fluctuance  Musculoskeletal:  Normal muscle strength 5/5 to all lower extremity muscle groups bilaterally. No pain crepitus or joint limitation noted with ROM b/l. Hammertoes noted to the R 2nd toe.  Neurological:  Protective sensation intact 5/5 intact bilaterally with 10g monofilament b/l. Vibratory sensation diminished b/l.  Assessment and Plan:  1. Pain due to onychomycosis of toenails of both feet   2. Porokeratosis   3. Idiopathic peripheral neuropathy    -Examined patient. -Continue nonformulary compounding topical antifungal: [facility] to affected toenails. -Toenails 2-5 bilaterally debrided in length and girth without  iatrogenic bleeding with sterile nail nipper and dremel.  -Painful porokeratotic lesion(s) submet head 5 right foot pared and enucleated with sterile scalpel blade without incident.  -Patient to report any pedal injuries to medical professional immediately. -Patient to continue soft, supportive shoe gear daily. -Patient/POA to call should there be question/concern in the interim.  Return in about 9 weeks (around 01/28/2021).  Marzetta Board, DPM

## 2020-12-05 ENCOUNTER — Telehealth: Payer: Self-pay

## 2020-12-05 NOTE — Telephone Encounter (Signed)
Increase Furosemide to 80 mg in AM and 40 mg in the afternoon (6 hours apart) x 3 days, then resume 80 mg once daily. Increase K+ to 20 mEq twice daily x 3 days, then resume 20 mEq once daily. Keep legs elevated. Maintain low Na diet. Call if no improvement or worsening edema.  Arrange f/u office visit w/ Dr. Burt Knack, me or other APP. Richardson Dopp, PA-C    12/05/2020 5:04 PM

## 2020-12-05 NOTE — Telephone Encounter (Signed)
  Prescription Request  12/05/2020  What is the name of the medication or equipment? Pantoprazole & Fluoxtine  Have you contacted your pharmacy to request a refill? (if applicable) yes  Which pharmacy would you like this sent to? [facility]   Patient notified that their request is being sent to the clinical staff for review and that they should receive a response within 2 business days.   [facility] pt.  Please call pt bc she called this in last week & she hasn't got her meds yet. She is almost out of her meds & needs them.

## 2020-12-06 NOTE — Telephone Encounter (Signed)
I called and spoke with patients daughter [name] [name], ok per patient DPR to speak with [name]. [name] [name] is aware to have patient increase her Furosemide to 80 mg in the morning and 6 hours later take 40 mg for 3 days. [name] [name] is also aware to have patient take Potassium twice a day for 3 days as well with Furosemide increase. Patient will keep legs elevated and follow low salt diet. Patient will call Monday if swelling is not better for an appointment. [name] verbalized understanding and thanked me for the call.

## 2020-12-06 NOTE — Telephone Encounter (Signed)
Medications sent to mail order on 11/28/20.  Patient should not need any further refills sent

## 2020-12-09 ENCOUNTER — Other Ambulatory Visit: Payer: Self-pay

## 2020-12-09 ENCOUNTER — Encounter: Payer: Self-pay | Admitting: Family Medicine

## 2020-12-09 DIAGNOSIS — I2510 Atherosclerotic heart disease of native coronary artery without angina pectoris: Secondary | ICD-10-CM

## 2020-12-09 DIAGNOSIS — I480 Paroxysmal atrial fibrillation: Secondary | ICD-10-CM

## 2020-12-09 MED ORDER — PANTOPRAZOLE SODIUM 40 MG PO TBEC: 40.0000 mg | DELAYED_RELEASE_TABLET | Freq: Two times a day (BID) | ORAL | 0 refills | Status: DC

## 2020-12-09 MED ORDER — FLUOXETINE HCL 40 MG PO CAPS: 40.0000 mg | ORAL_CAPSULE | Freq: Every day | ORAL | 0 refills | Status: DC

## 2020-12-11 ENCOUNTER — Other Ambulatory Visit: Payer: Self-pay

## 2020-12-11 MED ORDER — FUROSEMIDE 80 MG PO TABS: 80.0000 mg | ORAL_TABLET | Freq: Every day | ORAL | 0 refills | Status: DC

## 2020-12-12 NOTE — Telephone Encounter (Signed)
I spoke with patient's daughter.  She reports patient's swelling has improved a little.  Patient now has pinkish colored small raised areas on left lower leg near shin/ankle.  This area does not feel as cool as rest of patient's leg.  Daughter reports it may be slightly warmer than rest of leg.  Area is about 4-6 inches in length and 2 inches in width.  Not well defined. Both legs are painful due to swelling but area on left leg is no more painful than rest of legs.  Has not checked temperature. No bruising or sign of injury. Patient with chronic shortness of breath that has not worsened.   Will forward to Dr Burt Knack for review/recommendations.

## 2020-12-18 ENCOUNTER — Ambulatory Visit: Payer: Medicare HMO | Admitting: Podiatry

## 2020-12-27 ENCOUNTER — Encounter: Payer: Self-pay | Admitting: Cardiovascular Disease

## 2020-12-27 ENCOUNTER — Ambulatory Visit: Payer: Medicare HMO | Admitting: Cardiovascular Disease

## 2020-12-27 ENCOUNTER — Other Ambulatory Visit: Payer: Self-pay

## 2020-12-27 VITALS — BP 112/66 | HR 102 | Ht 67.0 in | Wt 181.4 lb

## 2020-12-27 DIAGNOSIS — I4821 Permanent atrial fibrillation: Secondary | ICD-10-CM

## 2020-12-27 DIAGNOSIS — G894 Chronic pain syndrome: Secondary | ICD-10-CM | POA: Diagnosis not present

## 2020-12-27 DIAGNOSIS — I2510 Atherosclerotic heart disease of native coronary artery without angina pectoris: Secondary | ICD-10-CM | POA: Diagnosis not present

## 2020-12-27 DIAGNOSIS — I5022 Chronic systolic (congestive) heart failure: Secondary | ICD-10-CM | POA: Diagnosis not present

## 2020-12-27 MED ORDER — POTASSIUM CHLORIDE ER 10 MEQ PO TBCR: 20.0000 meq | EXTENDED_RELEASE_TABLET | Freq: Every day | ORAL | 3 refills | Status: DC

## 2020-12-27 NOTE — Progress Notes (Signed)
Cardiology Office Note:    Date:  12/27/2020   ID:  [name] [name], DOB [birth year], MRN [id number]  PCP:  [name], DO  [facility] Cardiologist:  Sherren Mocha, MD  [facility] Electrophysiologist:  Vickie Epley, MD   Referring MD: [name], DO   Chief Complaint  Patient presents with   Shortness of Breath    History of Present Illness:    [name] [name] is a 80 y.o. female with a hx of:  Coronary artery disease ? Cath 12/15: Complex bifurcational distal LM/ostial LCx disease- poorCABG candidate  PCI 12/15: DES to distal LM and ostial LCx ? Myoview 11/17: + Inferior ischemia ? Cath 12/17: Patent LM/LCx stent; no significant RCA disease ? Myoview 6/21: No ischemia  Primarily Diastolic CHF ? Echocardiogram 4/21: EF 45-50, mild LVH, mild MR, mild-mod TR, trivial AI  Permanent atrial fibrillation ? S/p DCCV 1/17>>ERAF>>rate control strategy  Hypertension  Hyperlipidemia  S/p multiple back surgeries; managed with chronic narcotic pain medication  Hxof orthostatic hypotension; s/pmultiple falls in the past  Hepatic steatosis (elevated LFTs)  MGUS  Hx of CVA  The patient is here with her daughter today.  She continues to struggle with back pain and reduced mobility.  She is short of breath with activity and this is longstanding.  The patient is limited to low-level activity around her house.  She complains of leg swelling.  She denies orthopnea or PND.  She has had no recent chest pain.  She notes that she has difficulty tolerating potassium supplementation because of swallowing difficulties.  When she was seen last year her medications were adjusted so that she was to be started on a beta-blocker for chronic systolic heart failure.  She could not tolerate metoprolol succinate.  She has been placed back on diltiazem and is doing much better on this medication for rate control of her atrial fibrillation.  She denies any bleeding  problems related to oral anticoagulation.  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 Abnl nuc 11/2014. Cath 12/2014 - turned down for CABG. Ultimately s/p TTVP, rotational atherectomy, PTCA and stenting of the ostial LCx and left main into the LAD (crush technique), and IVUS of the LAD/Left main. // b. Myoview 11/17: EF 48, poor quality/significant artifact; inf-lateral, inferior ischemia; Intermediate Risk   Chronic atrial fibrillation ([place])    a.  First noted post-op 9/15 spinal fusion. She had cardioversion, not on anticoagulation. Fall risk, unsteady. // failed DCCV // Holter 10/17: AFib, Avg HR 97, PVCs, no other arrhythmia   Chronic back pain greater than 3 months duration    a. spinal stenosis. Spinal fusion with rods in 2/15 at [facility] spinal fusion 9/15.   Chronic diastolic CHF    Echo 3/50:  EF 50-55%, trivial AI, midl MR, mod LAE, PASP 37 mmHg // Echocardiogram 03/2020: EF 45-50, no RWMA, mild LVH, mild reduced RVSF, mildly elevated PASP (RVSP 38.3), mild LAE, mild MR, mild to mod TR, trivial AI // Echo 9/21: EF 45, mild LVH, mildly reduced RV SF, moderate LAE, mild RAE, mild MR, mild AI      Circadian rhythm sleep disorder    CTS (carpal tunnel syndrome)  Deficiency anemia 11/10/2014   Depression    Diarrhea    Diverticulitis of colon    Esophageal stricture    Gastritis    Gastroesophageal hernia 07/06/2013   GERD (gastroesophageal reflux disease)    Hiatal hernia    History of blood transfusion    "most of them related to OR's"    HTN (hypertension)    Hyperlipidemia    IBS (irritable bowel syndrome)    Incontinence 10/13/2012   Insomnia    Ischemic chest pain ([place])    Memory disorder 62/22/9798    Metabolic syndrome 08/22/1193   MGUS (monoclonal gammopathy of unknown significance) dx'd 11/2014   a. Neg BMB 11/2014.   Multiple falls    Obesity    Obstructive sleep apnea    "have mask; don't wear it" (01/07/2015)   Orthostasis    PAT (paroxysmal atrial tachycardia) ([place])    Personal history of colonic polyps 10/25/2011 & 12/02/11   not retrieved Dr Lyla Son & tubular adenomas   Pneumonia 03/2014   Sinus bradycardia    a. Baseline HR 50s-60s.   Stroke [facility]) early 2000's   "small"; denies residual on 01/07/2015)    Past Surgical History:  Procedure Laterality Date   BACK SURGERY     BONE MARROW BIOPSY  11/2014   CARDIAC CATHETERIZATION  12/27/2014   Procedure: INTRAVASCULAR PRESSURE WIRE/FFR STUDY;  Surgeon: Larey Dresser, MD;  Location: [facility] CATH LAB;  Service: Cardiovascular;;   CARDIAC CATHETERIZATION N/A 11/25/2016   Procedure: Left Heart Cath and Coronary Angiography;  Surgeon: Sherren Mocha, MD;  Location: [facility] CV LAB;  Service: Cardiovascular;  Laterality: N/A;   CARDIOVERSION N/A 02/13/2016   Procedure: CARDIOVERSION;  Surgeon: Josue Hector, MD;  Location: [facility];  Service: Cardiovascular;  Laterality: N/A;   CARPAL TUNNEL RELEASE Right 1980's   CATARACT EXTRACTION Bilateral    CATARACT EXTRACTION W/ INTRAOCULAR LENS  IMPLANT, BILATERAL  2000's   CORONARY ANGIOPLASTY WITH STENT PLACEMENT  01/07/2015   "2"   CORONARY STENT PLACEMENT     DILATION AND CURETTAGE OF UTERUS     ESOPHAGOGASTRODUODENOSCOPY (EGD) WITH ESOPHAGEAL DILATION  "several times"   JOINT REPLACEMENT     KNEE ARTHROSCOPY Left 1995   LAPAROSCOPIC CHOLECYSTECTOMY  2003   LEFT HEART CATHETERIZATION WITH CORONARY ANGIOGRAM N/A 12/27/2014   Procedure: LEFT HEART CATHETERIZATION WITH CORONARY ANGIOGRAM;  Surgeon: Larey Dresser, MD;  Location: [facility] CATH LAB;  Service: Cardiovascular;  Laterality: N/A;   PERCUTANEOUS CORONARY ROTOBLATOR INTERVENTION (PCI-R)  01/07/2015    PERCUTANEOUS CORONARY ROTOBLATOR INTERVENTION (PCI-R) N/A 01/07/2015   Procedure: PERCUTANEOUS CORONARY ROTOBLATOR INTERVENTION (PCI-R);  Surgeon: Blane Ohara, MD;  Location: [facility] CATH LAB;  Service: Cardiovascular;  Laterality: N/A;   POSTERIOR FUSION THORACIC SPINE  08/2015   POSTERIOR LUMBAR FUSION  01/2015   TOTAL KNEE ARTHROPLASTY Bilateral 1990's - 2000's    Current Medications: Current Meds  Medication Sig   albuterol (VENTOLIN HFA) 108 (90 Base) MCG/ACT inhaler Inhale 2 puffs into the lungs every 6 (six) hours as needed for wheezing or shortness of breath.   aspirin EC 81 MG tablet Take 1 tablet (81 mg total) by mouth daily.   Cholecalciferol (VITAMIN D) 2000 units tablet Take 2,000 Units by mouth daily.   clobetasol cream (TEMOVATE) 1.74 % Apply 1 application topically See admin instructions. Applies to vaginal area twice a week.   diltiazem (CARDIZEM CD) 120 MG 24 hr capsule Take 1 capsule (120 mg total) by mouth in  the morning and at bedtime.   diphenoxylate-atropine (LOMOTIL) 2.5-0.025 MG tablet Take 1-2 tablets orally up to 4x per day   ELIQUIS 5 MG TABS tablet Take 1 tablet (5 mg total) by mouth 2 (two) times daily.   Eszopiclone 3 MG TABS Take 1 tablet (3 mg total) by mouth at bedtime. For insomnia,take immediately before bedtime.   fentaNYL (DURAGESIC - DOSED MCG/HR) 50 MCG/HR Place 1 patch (50 mcg total) onto the skin every 3 (three) days.   FLUoxetine (PROZAC) 40 MG capsule Take 1 capsule (40 mg total) by mouth daily.   fluticasone (FLONASE) 50 MCG/ACT nasal spray Place 1 spray into both nostrils daily.   furosemide (LASIX) 80 MG tablet Take 1 tablet (80 mg total) by mouth daily.   HYDROcodone-acetaminophen (NORCO) 10-325 MG per tablet Take 1 tablet by mouth every 4 (four) hours as needed for moderate pain.    Multiple Vitamin (MULTIVITAMIN) tablet Take 1 tablet by mouth daily. For supplement   nitroGLYCERIN (NITROSTAT) 0.4 MG SL tablet Place 1 tablet (0.4  mg total) under the tongue every 5 (five) minutes as needed for chest pain (up to 3 doses).   NONFORMULARY OR COMPOUNDED ITEM Apply 1 application topically daily. Antifungal solution: Terbinafine 3%, Fluconazole 2%, Tea Tree Oil 5%, Urea 10%, Ibuprofen 2% in DMSO suspension #72m   pantoprazole (PROTONIX) 40 MG tablet Take 1 tablet (40 mg total) by mouth 2 (two) times daily.   potassium chloride SA (KLOR-CON) 20 MEQ tablet Take 1 tablet (20 mEq total) by mouth daily. dissolve  tablet on water.   rosuvastatin (CRESTOR) 10 MG tablet Take 1 tablet (10 mg total) by mouth daily.   triamcinolone cream (KENALOG) 0.1 % Apply 1 application topically 3 (three) times daily.      Allergies:   Buprenorphine hcl, Cymbalta [duloxetine hcl], Morphine and related, Oxycodone-acetaminophen, Valium [diazepam], Statins, Altace [ramipril], Floxin [ofloxacin], Hydromorphone, Lovaza [omega-3-acid ethyl esters], Trilipix [choline fenofibrate], and Zetia [ezetimibe]   Social History   Socioeconomic History   Marital status: Widowed    Spouse name: Not on file   Number of children: 2   Years of education: HS   Highest education level: Not on file  Occupational History   Occupation: lLicensed conveyancer retired    Comment: retired  Tobacco Use   Smoking status: Never Smoker   Smokeless tobacco: Never Used  VScientific laboratory technicianUse: Never used  Substance and Sexual Activity   Alcohol use: No    Comment: 01/07/2015 "glass of wine at Christmas, maybe"   Drug use: No   Sexual activity: Never  Other Topics Concern   Not on file  Social History Narrative   Patient is right handed   Patient drinks 1-2 sodas daily.   patlient lives alone.   Social Determinants of Health   Financial Resource Strain: Not on file  Food Insecurity: Not on file  Transportation Needs: Not on file  Physical Activity: Inactive   Days of Exercise per Week: 0 days   Minutes of Exercise per Session: 0 min  Stress: Stress  Concern Present   Feeling of Stress : To some extent  Social Connections: Socially Isolated   Frequency of Communication with Friends and Family: Three times a week   Frequency of Social Gatherings with Friends and Family: Once a week   Attends Religious Services: Never   AMarine scientistor Organizations: No   Attends CArchivistMeetings: Never   Marital Status: Widowed  Family History: The patient's family history includes Colon cancer in her sister and sister; Coronary artery disease in her brother, father, and mother; Emphysema in her brother and sister; Peripheral vascular disease in her father; Sleep apnea in her son. There is no history of Dementia.  ROS:   Please see the history of present illness.    All other systems reviewed and are negative.  EKGs/Labs/Other Studies Reviewed:    The following studies were reviewed today: Cardiac Catheterization 11-25-2016: Conclusion  Continued patency of the distal left mainstem/proximal LAD/proximal circumflex bifurcation stents with mild ISR of the proximal circumflex  Mild calcific, nonobstructive CAD  Recommend: continued medical therapy for CAD, CHF, and atrial fibrillation  Echo 08-20-2020: IMPRESSIONS    1. Left ventricular ejection fraction, by estimation, is 45% with beat to  beat variability. The left ventricle has mildly decreased function. There  is mild left ventricular hypertrophy. Left ventricular diastolic  parameters are indeterminate.  2. Right ventricular systolic function is mildly reduced. The right  ventricular size is normal.  3. Left atrial size was moderately dilated, visually estimated.  4. Right atrial size was mildly dilated, visually estimated.  5. The mitral valve is degenerative. Mild mitral valve regurgitation.  6. The aortic valve is grossly normal. Aortic valve regurgitation is  mild.   Comparison(s): A prior study was performed on 04/15/20. No significant   change from prior study. Prior images reviewed side by side.   EKG:  EKG is ordered today.  The ekg ordered today demonstrates atrial fibrillation heart rate 102 bpm, nonspecific ST abnormality.  No significant change from previous.  Recent Labs: 07/17/2020: Magnesium 2.1 09/08/2020: Hemoglobin 13.0; Platelets 208 10/01/2020: ALT 27; BUN 8; Creatinine, Ser 0.80; Potassium 3.7; Sodium 141; TSH 1.680  Recent Lipid Panel    Component Value Date/Time   CHOL 138 07/20/2019 1252   CHOL 156 05/11/2013 1536   TRIG 123 07/20/2019 1252   TRIG 121 02/18/2016 1504   TRIG 239 (H) 05/11/2013 1536   HDL 61 07/20/2019 1252   HDL 57 02/18/2016 1504   HDL 43 05/11/2013 1536   CHOLHDL 2.3 07/20/2019 1252   LDLCALC 52 07/20/2019 1252   LDLCALC 50 08/21/2014 1141   LDLCALC 65 05/11/2013 1536     Risk Assessment/Calculations:     CHA2DS2-VASc Score = 5  This indicates a 7.2% annual risk of stroke. The patient's score is based upon: CHF History: Yes HTN History: No Diabetes History: No Stroke History: No Vascular Disease History: Yes Age Score: 2 Gender Score: 1      Physical Exam:    VS:  BP 112/66    Pulse (!) 102    Ht 5' 7"  (1.702 m)    Wt 181 lb 6.4 oz (82.3 kg)    SpO2 95%    BMI 28.41 kg/m     Wt Readings from Last 3 Encounters:  12/27/20 181 lb 6.4 oz (82.3 kg)  10/09/20 174 lb (78.9 kg)  10/01/20 174 lb 3.2 oz (79 kg)     GEN:  Well nourished, well developed elderly woman in no acute distress HEENT: Normal NECK: No JVD; No carotid bruits LYMPHATICS: No lymphadenopathy CARDIAC: RRR, no murmurs, rubs, gallops RESPIRATORY:  Clear to auscultation without rales, wheezing or rhonchi  ABDOMEN: Soft, non-tender, non-distended MUSCULOSKELETAL: Trace bilateral pretibial edema, the legs are tender; No deformity  SKIN: Warm and dry NEUROLOGIC:  Alert and oriented x 3 PSYCHIATRIC:  Normal affect   ASSESSMENT:    1. Permanent atrial  fibrillation ([place])   2. Chronic pain  syndrome   3. Chronic systolic heart failure ([place])   4. Coronary artery disease involving native coronary artery of native heart without angina pectoris    PLAN:    In order of problems listed above:  1. Heart rate is always been modestly elevated.  She seems to be tolerating a combination of digoxin and diltiazem fairly well.  She did not tolerate a change to metoprolol succinate.  No changes are made today.  Continue oral anticoagulation with apixaban. 2. She asked about her risk of back surgery.  I think considering her very poor functional capacity and comorbid conditions she would be at high risk of a prolonged recovery and suboptimal outcome from surgery.  If she were to require a lower risk surgery such as implantation of a stimulator for pain control, that would probably be reasonable.  She will inquire from her other providers who have expertise in this area.  I am happy to try to help with cardiac risk stratification. 3. Stable at present.  Continue current diuretic therapy.  We will change potassium supplementation to something she can tolerate better. 4. No symptoms of angina at present.  She remains on low-dose aspirin after she has undergone left main bifurcation stenting.        Medication Adjustments/Labs and Tests Ordered: Current medicines are reviewed at length with the patient today.  Concerns regarding medicines are outlined above.  No orders of the defined types were placed in this encounter.  No orders of the defined types were placed in this encounter.   There are no Patient Instructions on file for this visit.   Signed, Sherren Mocha, MD  12/27/2020 3:09 PM    [place]

## 2020-12-27 NOTE — Patient Instructions (Signed)
Medication Instructions:  1) STOP POTASSIUM 20 meq daily 2) START KLOR CON 10 meq 2 tablets daily *If you need a refill on your cardiac medications before your next appointment, please call your pharmacy*  Follow-Up: At [facility], you and your health needs are our priority.  As part of our continuing mission to provide you with exceptional heart care, we have created designated Provider Care Teams.  These Care Teams include your primary Cardiologist (physician) and Advanced Practice Providers (APPs -  Physician Assistants and Nurse Practitioners) who all work together to provide you with the care you need, when you need it. Your next appointment:   6 month(s) The format for your next appointment:   In Person Provider:   You may see Sherren Mocha, MD or one of the following Advanced Practice Providers on your designated Care Team:    Richardson Dopp, PA-C  [place], [place]

## 2021-01-01 ENCOUNTER — Telehealth: Payer: Self-pay | Admitting: *Deleted

## 2021-01-01 NOTE — Telephone Encounter (Signed)
Order resent for patient to [employer] for ResMed CPAP on auto from 4-18cm H2O with heated humidity and mask of choice.

## 2021-01-15 ENCOUNTER — Ambulatory Visit: Payer: Medicare HMO | Admitting: Cardiology

## 2021-01-28 ENCOUNTER — Encounter: Payer: Self-pay | Admitting: Podiatry

## 2021-01-28 ENCOUNTER — Other Ambulatory Visit: Payer: Self-pay

## 2021-01-28 ENCOUNTER — Ambulatory Visit (INDEPENDENT_AMBULATORY_CARE_PROVIDER_SITE_OTHER): Payer: Medicare HMO | Admitting: Podiatry

## 2021-01-28 DIAGNOSIS — M79674 Pain in right toe(s): Secondary | ICD-10-CM

## 2021-01-28 DIAGNOSIS — G609 Hereditary and idiopathic neuropathy, unspecified: Secondary | ICD-10-CM

## 2021-01-28 DIAGNOSIS — M79675 Pain in left toe(s): Secondary | ICD-10-CM

## 2021-01-28 DIAGNOSIS — B351 Tinea unguium: Secondary | ICD-10-CM

## 2021-01-28 DIAGNOSIS — Q828 Other specified congenital malformations of skin: Secondary | ICD-10-CM | POA: Diagnosis not present

## 2021-01-28 DIAGNOSIS — Z9229 Personal history of other drug therapy: Secondary | ICD-10-CM

## 2021-01-28 NOTE — Progress Notes (Signed)
Subjective: [name] [name] is a pleasant 80 y.o. female patient seen today painful porokeratotic lesion(s) right foot and painful mycotic toenails that limit ambulation. Aggravating factors include weightbearing with and without shoe gear. Pain for both is relieved with periodic professional debridement.   Her daughter is present during today's visit. Ms. Wake states her callus is not as painful on today's visit.. They voice no new pedal problems on today's visit.  PCP is Dr. Ronnie Doss. Last visit was 10/01/2020.  Past Medical History:  Diagnosis Date  . Abnormal nuclear cardiac imaging test   . Anemia   . Anemia, iron deficiency 07/02/2015  . Anxiety   . Arthritis    "knees, back, fingers, toes; joints" (01/07/2015)  . 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 CAD (coronary artery disease)    a. Abnl nuc 11/2014. Cath 12/2014 - turned down for CABG. Ultimately s/p TTVP, rotational atherectomy, PTCA and stenting of the ostial LCx and left main into the LAD (crush technique), and IVUS of the LAD/Left main. // b. Myoview 11/17: EF 48, poor quality/significant artifact; inf-lateral, inferior ischemia; Intermediate Risk  . Chronic atrial fibrillation ([place])    a.  First noted post-op 9/15 spinal fusion. She had cardioversion, not on anticoagulation. Fall risk, unsteady. // failed DCCV // Holter 10/17: AFib, Avg HR 97, PVCs, no other arrhythmia  . Chronic back pain greater than 3 months duration    a. spinal stenosis. Spinal fusion with rods in 2/15 at [facility] spinal fusion 9/15.  Marland Kitchen Chronic diastolic CHF    Echo 4/69:  EF 50-55%, trivial AI, midl MR, mod LAE, PASP 37 mmHg // Echocardiogram 03/2020: EF 45-50, no RWMA, mild LVH, mild reduced RVSF, mildly elevated PASP (RVSP 38.3), mild LAE, mild MR, mild to mod TR, trivial AI // Echo 9/21: EF 45, mild LVH, mildly reduced RV SF, moderate LAE, mild RAE, mild MR, mild AI     . Circadian rhythm sleep disorder   . CTS (carpal tunnel syndrome)   .  Deficiency anemia 11/10/2014  . Depression   . Diarrhea   . Diverticulitis of colon   . Esophageal stricture   . Gastritis   . Gastroesophageal hernia 07/06/2013  . GERD (gastroesophageal reflux disease)   . Hiatal hernia   . History of blood transfusion    "most of them related to OR's"   . HTN (hypertension)   . Hyperlipidemia   . IBS (irritable bowel syndrome)   . Incontinence 10/13/2012  . Insomnia   . Ischemic chest pain ([place])   . Memory disorder 12/04/2014  . Metabolic syndrome 05/23/9527  . MGUS (monoclonal gammopathy of unknown significance) dx'd 11/2014   a. Neg BMB 11/2014.  . Multiple falls   . Obesity   . Obstructive sleep apnea    "have mask; don't wear it" (01/07/2015)  . Orthostasis   . PAT (paroxysmal atrial tachycardia) ([place])   . Personal history of colonic polyps 10/25/2011 & 12/02/11   not retrieved Dr Lyla Son & tubular adenomas  . Pneumonia 03/2014  . Sinus bradycardia    a. Baseline HR 50s-60s.  . Stroke [facility]) early 2000's   "small"; denies residual on 01/07/2015)    Patient Active Problem List   Diagnosis Date Noted  . Lichen sclerosus et atrophicus 11/26/2020  . Choking   . Aspiration into airway 07/16/2020  . Acquired trigger finger of right middle finger 07/09/2020  . Osteoarthritis of first carpometacarpal joint of left hand 07/09/2020  . Dysphagia 03/25/2020  .  HOH (hard of hearing) 03/25/2020  . Lichen sclerosus et atrophicus of the vulva 01/03/2019  . Chronic pain syndrome 02/14/2018  . Long-term current use of opiate analgesic 02/14/2018  . Anxiety 07/21/2016  . Chronic diastolic CHF (congestive heart failure) ([place]) 07/17/2015  . DDD (degenerative disc disease), lumbar 03/22/2015  . Fall 03/22/2015  . 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 Difficulty hearing 03/22/2015  . Lumbar scoliosis 03/22/2015  . History of pelvic fracture 03/22/2015  . History of other specified conditions presenting hazards to health 03/22/2015  . MGUS (monoclonal  gammopathy of unknown significance) 01/08/2015  . Permanent atrial fibrillation ([place]) 12/22/2014  . CAD (coronary artery disease) 12/22/2014  . Memory disorder 12/04/2014  . Angulation of spine 09/18/2014  . Metabolic syndrome 74/07/1447  . S/P spinal fusion 02/23/2014  . Chronic pain associated with significant psychosocial dysfunction 02/23/2014  . Gastroesophageal hernia 07/06/2013  . Incontinence 10/13/2012  . 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 DEGENERATIVE JOINT DISEASE 04/08/2010  . Obesity (BMI 30.0-34.9) 04/04/2009  . CIRCADIAN RHYTHM SLEEP D/O DELAY SLEEP PHSE TYPE 04/04/2009  . CARPAL TUNNEL SYNDROME 04/04/2009  . IRRITABLE BOWEL SYNDROME 11/13/2008  . ESOPHAGEAL STRICTURE 11/12/2008  . GERD 11/12/2008  . Paralysis of diaphragm 11/12/2008  . DIVERTICULOSIS, COLON 11/12/2008  . Obstructive sleep apnea 05/23/2008  . Hyperlipidemia 05/22/2008  . Depression 05/22/2008  . Essential hypertension 05/22/2008  . INSOMNIA 05/22/2008    Current Outpatient Medications on File Prior to Visit  Medication Sig Dispense Refill  . albuterol (VENTOLIN HFA) 108 (90 Base) MCG/ACT inhaler Inhale 2 puffs into the lungs every 6 (six) hours as needed for wheezing or shortness of breath. 6.7 g 1  . aspirin EC 81 MG tablet Take 1 tablet (81 mg total) by mouth daily. 90 tablet 3  . Cholecalciferol (VITAMIN D) 2000 units tablet Take 2,000 Units by mouth daily.    . clobetasol cream (TEMOVATE) 1.85 % Apply 1 application topically See admin instructions. Applies to vaginal area twice a week.    . diltiazem (CARDIZEM CD) 120 MG 24 hr capsule Take 1 capsule (120 mg total) by mouth in the morning and at bedtime. 180 capsule 3  . diphenoxylate-atropine (LOMOTIL) 2.5-0.025 MG tablet Take 1-2 tablets orally up to 4x per day    . ELIQUIS 5 MG TABS tablet Take 1 tablet (5 mg total) by mouth 2 (two) times daily. 180 tablet 2  . Eszopiclone 3 MG TABS Take 1 tablet (3 mg total) by mouth at bedtime. For  insomnia,take immediately before bedtime. 90 tablet 1  . fentaNYL (DURAGESIC - DOSED MCG/HR) 50 MCG/HR Place 1 patch (50 mcg total) onto the skin every 3 (three) days. 10 patch 0  . FLUoxetine (PROZAC) 40 MG capsule Take 1 capsule (40 mg total) by mouth daily. 90 capsule 0  . fluticasone (FLONASE) 50 MCG/ACT nasal spray Place 1 spray into both nostrils daily. 30 g 2  . furosemide (LASIX) 80 MG tablet Take 1 tablet (80 mg total) by mouth daily. 90 tablet 0  . HYDROcodone-acetaminophen (NORCO) 10-325 MG per tablet Take 1 tablet by mouth every 4 (four) hours as needed for moderate pain.     . Multiple Vitamin (MULTIVITAMIN) tablet Take 1 tablet by mouth daily. For supplement    . nitroGLYCERIN (NITROSTAT) 0.4 MG SL tablet Place 1 tablet (0.4 mg total) under the tongue every 5 (five) minutes as needed for chest pain (up to 3 doses). 25 tablet 3  . NONFORMULARY OR COMPOUNDED ITEM Apply 1 application topically  daily. Antifungal solution: Terbinafine 3%, Fluconazole 2%, Tea Tree Oil 5%, Urea 10%, Ibuprofen 2% in DMSO suspension #39mL    . pantoprazole (PROTONIX) 40 MG tablet Take 1 tablet (40 mg total) by mouth 2 (two) times daily. 180 tablet 0  . potassium chloride (KLOR-CON 10) 10 MEQ tablet Take 2 tablets (20 mEq total) by mouth daily. 180 tablet 3  . rosuvastatin (CRESTOR) 10 MG tablet Take 1 tablet (10 mg total) by mouth daily. 90 tablet 0  . triamcinolone cream (KENALOG) 0.1 % Apply 1 application topically 3 (three) times daily.      No current facility-administered medications on file prior to visit.    Allergies  Allergen Reactions  . Buprenorphine Hcl Itching  . Cymbalta [Duloxetine Hcl] Other (See Comments)    Other reaction(s): Other (See Comments) Personality changes - crying  2014  . Morphine And Related Itching  . Oxycodone-Acetaminophen Other (See Comments)    Personality changes   . Valium [Diazepam] Other (See Comments)    Personality changes - "in another world" ;   Hallucinations  2015  . Statins Other (See Comments)    Other reaction(s): Other (See Comments) Muscle weakness Muscle weakness  . Altace [Ramipril] Other (See Comments)    unknown  . Floxin [Ofloxacin] Other (See Comments)    unknown  . Hydromorphone Other (See Comments)    Other reaction(s): Confusion (intolerance) unknown  . Lovaza [Omega-3-Acid Ethyl Esters] Other (See Comments)    Muscle weakness   . Trilipix [Choline Fenofibrate] Other (See Comments)    Muscle weakness   . Zetia [Ezetimibe] Other (See Comments)    Muscle weakness     Objective: Physical Exam General: Suzetta W Eagon is a pleasant 80 y.o. Caucasian female, WD, WN in NAD. AAO x 3.   Vascular:  Neurovascular status unchanged b/l lower extremities. Capillary refill time to digits immediate b/l. Palpable DP pulse(s) b/l lower extremities Faintly palpable PT pulse(s) b/l lower extremities. Pedal hair present. Lower extremity skin temperature gradient within normal limits. No pain with calf compression b/l.  Dermatological:  Pedal skin with normal turgor, texture and tone bilaterally. No open wounds bilaterally. No interdigital macerations bilaterally. Toenails 2-5 bilaterally elongated, discolored, dystrophic, thickened, and crumbly with subungual debris and tenderness to dorsal palpation. Anonychia noted L hallux and R hallux. Nailbed(s) epithelialized.  Porokeratotic lesion(s) submet head 5 right foot. No erythema, no edema, no drainage, no fluctuance  Musculoskeletal:  Normal muscle strength 5/5 to all lower extremity muscle groups bilaterally. No pain crepitus or joint limitation noted with ROM b/l. Hammertoes noted to the R 2nd toe.  Neurological:  Protective sensation intact 5/5 intact bilaterally with 10g monofilament b/l. Vibratory sensation diminished b/l.  Assessment and Plan:  1. Pain due to onychomycosis of toenails of both feet   2. Porokeratosis   3. Idiopathic peripheral neuropathy   4. Hx of  long term use of blood thinners    -Examined patient. -No new findings. No new orders. -Continue nonformulary compounding topical antifungal: [facility] to affected toenails. -Toenails 2-5 bilaterally debrided in length and girth without iatrogenic bleeding with sterile nail nipper and dremel.  -Porokeratotic lesion(s) submet head 5 right foot pared and enucleated with sterile scalpel blade without incident.  -Patient to report any pedal injuries to medical professional immediately. -Patient to continue soft, supportive shoe gear daily. -Patient/POA to call should there be question/concern in the interim.  Return in about 9 weeks (around 04/01/2021).  Marzetta Board, DPM

## 2021-01-31 ENCOUNTER — Other Ambulatory Visit: Payer: Self-pay

## 2021-01-31 ENCOUNTER — Ambulatory Visit: Payer: Medicare HMO | Admitting: Family Medicine

## 2021-01-31 VITALS — BP 110/70 | HR 93 | Temp 97.7°F | Ht 67.0 in | Wt 185.0 lb

## 2021-01-31 DIAGNOSIS — I10 Essential (primary) hypertension: Secondary | ICD-10-CM

## 2021-01-31 DIAGNOSIS — M25552 Pain in left hip: Secondary | ICD-10-CM | POA: Diagnosis not present

## 2021-01-31 DIAGNOSIS — R600 Localized edema: Secondary | ICD-10-CM | POA: Diagnosis not present

## 2021-01-31 DIAGNOSIS — I4821 Permanent atrial fibrillation: Secondary | ICD-10-CM

## 2021-01-31 DIAGNOSIS — R413 Other amnesia: Secondary | ICD-10-CM

## 2021-01-31 MED ORDER — LIDOCAINE 5 % EX PTCH
1.0000 | MEDICATED_PATCH | Freq: Every day | CUTANEOUS | 0 refills | Status: DC | PRN
Start: 2021-01-31 — End: 2021-07-21

## 2021-01-31 NOTE — Progress Notes (Signed)
Subjective: CC: f.u afib, HTN, pain PCP: [name], DO FYB:OFBPZWCH W Whitehurst is a 80 y.o. female presenting to clinic today for:  1.  Atrial fibrillation Patient continues to take medications as prescribed.  She did have an episode where she became very short of breath and contacted her daughter.  Her daughter was quite worried and notes that she gave her nitroglycerin and the shortness of breath seemingly resolved but she developed a low-grade temperature to 99 degrees shortly after.  She had some difficulty that evening but has since been normal.  She has had some edema over the last couple of days but she attributes this to having not taken her Lasix over the last 3 days due to various appointments in [place].  2.  Left lower extremity pain Patient reports pain that radiates from the left buttock down the left lower extremity.  This has been an ongoing issue intermittently more severe since last when she ruptured a muscle.   ROS: Per HPI  Allergies  Allergen Reactions  . Buprenorphine Hcl Itching  . Cymbalta [Duloxetine Hcl] Other (See Comments)    Other reaction(s): Other (See Comments) Personality changes - crying  2014  . Morphine And Related Itching  . Oxycodone-Acetaminophen Other (See Comments)    Personality changes   . Valium [Diazepam] Other (See Comments)    Personality changes - "in another world" ;  Hallucinations  2015  . Statins Other (See Comments)    Other reaction(s): Other (See Comments) Muscle weakness Muscle weakness  . Altace [Ramipril] Other (See Comments)    unknown  . Floxin [Ofloxacin] Other (See Comments)    unknown  . Hydromorphone Other (See Comments)    Other reaction(s): Confusion (intolerance) unknown  . Lovaza [Omega-3-Acid Ethyl Esters] Other (See Comments)    Muscle weakness   . Trilipix [Choline Fenofibrate] Other (See Comments)    Muscle weakness   . Zetia [Ezetimibe] Other (See Comments)    Muscle weakness    Past  Medical History:  Diagnosis Date  . Abnormal nuclear cardiac imaging test   . Anemia   . Anemia, iron deficiency 07/02/2015  . Anxiety   . Arthritis    "knees, back, fingers, toes; joints" (01/07/2015)  . 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 CAD (coronary artery disease)    a. Abnl nuc 11/2014. Cath 12/2014 - turned down for CABG. Ultimately s/p TTVP, rotational atherectomy, PTCA and stenting of the ostial LCx and left main into the LAD (crush technique), and IVUS of the LAD/Left main. // b. Myoview 11/17: EF 48, poor quality/significant artifact; inf-lateral, inferior ischemia; Intermediate Risk  . Chronic atrial fibrillation ([place])    a.  First noted post-op 9/15 spinal fusion. She had cardioversion, not on anticoagulation. Fall risk, unsteady. // failed DCCV // Holter 10/17: AFib, Avg HR 97, PVCs, no other arrhythmia  . Chronic back pain greater than 3 months duration    a. spinal stenosis. Spinal fusion with rods in 2/15 at [facility] spinal fusion 9/15.  Marland Kitchen Chronic diastolic CHF    Echo 8/52:  EF 50-55%, trivial AI, midl MR, mod LAE, PASP 37 mmHg // Echocardiogram 03/2020: EF 45-50, no RWMA, mild LVH, mild reduced RVSF, mildly elevated PASP (RVSP 38.3), mild LAE, mild MR, mild to mod TR, trivial AI // Echo 9/21: EF 45, mild LVH, mildly reduced RV SF, moderate LAE, mild RAE, mild MR, mild AI     . Circadian rhythm sleep disorder   . CTS (carpal tunnel syndrome)   .  Deficiency anemia 11/10/2014  . Depression   . Diarrhea   . Diverticulitis of colon   . Esophageal stricture   . Gastritis   . Gastroesophageal hernia 07/06/2013  . GERD (gastroesophageal reflux disease)   . Hiatal hernia   . History of blood transfusion    "most of them related to OR's"   . HTN (hypertension)   . Hyperlipidemia   . IBS (irritable bowel syndrome)   . Incontinence 10/13/2012  . Insomnia   . Ischemic chest pain ([place])   . Memory disorder 12/04/2014  . Metabolic syndrome 12/26/1094  . MGUS (monoclonal  gammopathy of unknown significance) dx'd 11/2014   a. Neg BMB 11/2014.  . Multiple falls   . Obesity   . Obstructive sleep apnea    "have mask; don't wear it" (01/07/2015)  . Orthostasis   . PAT (paroxysmal atrial tachycardia) ([place])   . Personal history of colonic polyps 10/25/2011 & 12/02/11   not retrieved Dr Lyla Son & tubular adenomas  . Pneumonia 03/2014  . Sinus bradycardia    a. Baseline HR 50s-60s.  . Stroke [facility]) early 2000's   "small"; denies residual on 01/07/2015)    Current Outpatient Medications:  .  albuterol (VENTOLIN HFA) 108 (90 Base) MCG/ACT inhaler, Inhale 2 puffs into the lungs every 6 (six) hours as needed for wheezing or shortness of breath., Disp: 6.7 g, Rfl: 1 .  aspirin EC 81 MG tablet, Take 1 tablet (81 mg total) by mouth daily., Disp: 90 tablet, Rfl: 3 .  Cholecalciferol (VITAMIN D) 2000 units tablet, Take 2,000 Units by mouth daily., Disp: , Rfl:  .  clobetasol cream (TEMOVATE) 0.45 %, Apply 1 application topically See admin instructions. Applies to vaginal area twice a week., Disp: , Rfl:  .  diphenoxylate-atropine (LOMOTIL) 2.5-0.025 MG tablet, Take 1-2 tablets orally up to 4x per day, Disp: , Rfl:  .  ELIQUIS 5 MG TABS tablet, Take 1 tablet (5 mg total) by mouth 2 (two) times daily., Disp: 180 tablet, Rfl: 2 .  Eszopiclone 3 MG TABS, Take 1 tablet (3 mg total) by mouth at bedtime. For insomnia,take immediately before bedtime., Disp: 90 tablet, Rfl: 1 .  fentaNYL (DURAGESIC - DOSED MCG/HR) 50 MCG/HR, Place 1 patch (50 mcg total) onto the skin every 3 (three) days., Disp: 10 patch, Rfl: 0 .  FLUoxetine (PROZAC) 40 MG capsule, Take 1 capsule (40 mg total) by mouth daily., Disp: 90 capsule, Rfl: 0 .  fluticasone (FLONASE) 50 MCG/ACT nasal spray, Place 1 spray into both nostrils daily., Disp: 30 g, Rfl: 2 .  furosemide (LASIX) 80 MG tablet, Take 1 tablet (80 mg total) by mouth daily., Disp: 90 tablet, Rfl: 0 .  HYDROcodone-acetaminophen (NORCO) 10-325 MG per  tablet, Take 1 tablet by mouth every 4 (four) hours as needed for moderate pain. , Disp: , Rfl:  .  Multiple Vitamin (MULTIVITAMIN) tablet, Take 1 tablet by mouth daily. For supplement, Disp: , Rfl:  .  nitroGLYCERIN (NITROSTAT) 0.4 MG SL tablet, Place 1 tablet (0.4 mg total) under the tongue every 5 (five) minutes as needed for chest pain (up to 3 doses)., Disp: 25 tablet, Rfl: 3 .  NONFORMULARY OR COMPOUNDED ITEM, Apply 1 application topically daily. Antifungal solution: Terbinafine 3%, Fluconazole 2%, Tea Tree Oil 5%, Urea 10%, Ibuprofen 2% in DMSO suspension #17mL, Disp: , Rfl:  .  pantoprazole (PROTONIX) 40 MG tablet, Take 1 tablet (40 mg total) by mouth 2 (two) times daily., Disp: 180 tablet, Rfl:  0 .  potassium chloride (KLOR-CON 10) 10 MEQ tablet, Take 2 tablets (20 mEq total) by mouth daily., Disp: 180 tablet, Rfl: 3 .  rosuvastatin (CRESTOR) 10 MG tablet, Take 1 tablet (10 mg total) by mouth daily., Disp: 90 tablet, Rfl: 0 .  triamcinolone cream (KENALOG) 0.1 %, Apply 1 application topically 3 (three) times daily. , Disp: , Rfl:  .  diltiazem (CARDIZEM CD) 120 MG 24 hr capsule, Take 1 capsule (120 mg total) by mouth in the morning and at bedtime., Disp: 180 capsule, Rfl: 3 Social History   Socioeconomic History  . Marital status: Widowed    Spouse name: Not on file  . Number of children: 2  . Years of education: HS  . Highest education level: Not on file  Occupational History  . Occupation: Licensed conveyancer- retired    Comment: retired  Tobacco Use  . Smoking status: Never Smoker  . Smokeless tobacco: Never Used  Vaping Use  . Vaping Use: Never used  Substance and Sexual Activity  . Alcohol use: No    Comment: 01/07/2015 "glass of wine at Christmas, maybe"  . Drug use: No  . Sexual activity: Never  Other Topics Concern  . Not on file  Social History Narrative   Patient is right handed   Patient drinks 1-2 sodas daily.   patlient lives alone.   Social Determinants of Health    Financial Resource Strain: Not on file  Food Insecurity: Not on file  Transportation Needs: Not on file  Physical Activity: Inactive  . Days of Exercise per Week: 0 days  . Minutes of Exercise per Session: 0 min  Stress: Stress Concern Present  . Feeling of Stress : To some extent  Social Connections: Socially Isolated  . Frequency of Communication with Friends and Family: Three times a week  . Frequency of Social Gatherings with Friends and Family: Once a week  . Attends Religious Services: Never  . Active Member of Clubs or Organizations: No  . Attends Archivist Meetings: Never  . Marital Status: Widowed  Intimate Partner Violence: Not on file   Family History  Problem Relation Age of Onset  . Coronary artery disease Father   . Peripheral vascular disease Father   . Coronary artery disease Mother   . Coronary artery disease Brother   . Colon cancer Sister   . Emphysema Sister   . Sleep apnea Son   . Colon cancer Sister        spread to her brain  . Emphysema Brother   . Dementia Neg Hx     Objective: Office vital signs reviewed. BP 110/70   Pulse 93   Temp 97.7 F (36.5 C) (Temporal)   Ht _0  (1.702 m)   Wt 185 lb (83.9 kg)   BMI 28.98 kg/m   Physical Examination:  General: Awake, alert, well nourished, No acute distress HEENT: Normal; sclera white Cardio: Irregularly irregular with normal rate. S1S2 heard, no murmurs appreciated Pulm: clear to auscultation bilaterally, no wheezes, rhonchi or rales; normal work of breathing on room air Extremities: warm, well perfused, 1-2+ pitting edema R>L, No cyanosis or clubbing; +2 pulses bilaterally MSK: slow antalgic gait and station; she is tender at the proportion to exam along entire left lateral hip over the IT band.  She has joint effusion noted on the right and postsurgical scar noted on bilateral knees Skin: dry; intact; no rashes or lesions Psych: Mood is extremely labile.  Patient hyperfocuses on  pain today  Assessment/ Plan: 80 y.o. female   Permanent atrial fibrillation ([place]) - Plan: CMP14+EGFR  Essential hypertension  Bilateral edema of lower extremity  Left hip pain - Plan: lidocaine (LIDODERM) 5 %  Memory disorder  Rate controlled with irregular rhythm on exam.  Will check renal function.  Not due thyroid function.  No longer on digoxin.  Blood pressure was controlled.  Continue current regimen  She does have bilateral lower extremity edema this is likely due to the lapse of use of Lasix due to most recent appointments.  Advised to resume use, elevate lower extremities.  Compression hose as tolerated  Uncertain etiology of left hip pain.  She has had an injury to the soft tissue/muscle but I cannot find any imaging in the EMR.  I was able to find mention of it in the December 2021 note but again does not specifically say what muscle was ruptured.  At that point it looks like they were offering spinal cord stim as she was mentioning the left lower extremity pain during that visit as well but it was not mentioned at her most recent visit.  I will reach out to orthopedics and see if perhaps they can readdress this for this patient.  In the meantime I am going to send lidocaine patch as an effort to help alleviate some of the discomfort she is experiencing  With regards to memory disorder she was previously treated with something by neurology but did not find it especially helpful.  May consider revisiting either Namenda or Aricept in this patient.  She wanted to know if Prevagen was an option.  I do not see a barrier to using it but I am not sure how helpful it would be.  Total time spent with patient 31 minutes.  Greater than 50% of encounter spent in coordination of care/counseling.   Orders Placed This Encounter  Procedures  . CMP14+EGFR   Meds ordered this encounter  Medications  . lidocaine (LIDODERM) 5 %    Sig: Place 1 patch onto the skin daily as needed ((leave on  for 12 hours and leave off for 12 hours)). Remove & Discard patch within 12 hours or as directed by MD    Dispense:  30 patch    Refill:  [place], [place] [phone]

## 2021-02-01 LAB — CMP14+EGFR
ALT: 40 IU/L — ABNORMAL HIGH (ref 0–32)
AST: 32 IU/L (ref 0–40)
Albumin/Globulin Ratio: 1.6 (ref 1.2–2.2)
Albumin: 4.1 g/dL (ref 3.6–4.6)
Alkaline Phosphatase: 111 IU/L (ref 44–121)
BUN/Creatinine Ratio: 17 (ref 12–28)
BUN: 12 mg/dL (ref 8–27)
Bilirubin Total: 0.5 mg/dL (ref 0.0–1.2)
CO2: 23 mmol/L (ref 20–29)
Calcium: 9.2 mg/dL (ref 8.7–10.3)
Chloride: 101 mmol/L (ref 96–106)
Creatinine, Ser: 0.69 mg/dL (ref 0.57–1.00)
GFR calc Af Amer: 94 mL/min/{1.73_m2} (ref >59)
GFR calc non Af Amer: 82 mL/min/{1.73_m2} (ref >59)
Globulin, Total: 2.5 g/dL (ref 1.5–4.5)
Glucose: 148 mg/dL — ABNORMAL HIGH (ref 65–99)
Potassium: 4.3 mmol/L (ref 3.5–5.2)
Sodium: 138 mmol/L (ref 134–144)
Total Protein: 6.6 g/dL (ref 6.0–8.5)

## 2021-02-02 ENCOUNTER — Encounter: Payer: Self-pay | Admitting: Family Medicine

## 2021-02-03 ENCOUNTER — Telehealth: Payer: Self-pay | Admitting: *Deleted

## 2021-02-03 NOTE — Telephone Encounter (Signed)
Attempted PT = NA   Called daughter - aware to try OTC

## 2021-02-03 NOTE — Telephone Encounter (Signed)
Yes that would be fine.  Please let her know of insurance complication.

## 2021-02-03 NOTE — Telephone Encounter (Signed)
Lidocaine patches denied with Medicare  Can pt try [place] patched OTC?

## 2021-02-04 ENCOUNTER — Encounter: Payer: Self-pay | Admitting: Family Medicine

## 2021-02-05 MED ORDER — ELIQUIS 5 MG PO TABS: 5.0000 mg | ORAL_TABLET | Freq: Two times a day (BID) | ORAL | 2 refills | Status: DC

## 2021-02-11 ENCOUNTER — Encounter: Payer: Self-pay | Admitting: Family Medicine

## 2021-03-11 ENCOUNTER — Telehealth: Payer: Self-pay

## 2021-03-11 DIAGNOSIS — I480 Paroxysmal atrial fibrillation: Secondary | ICD-10-CM

## 2021-03-11 DIAGNOSIS — I2510 Atherosclerotic heart disease of native coronary artery without angina pectoris: Secondary | ICD-10-CM

## 2021-03-11 MED ORDER — PANTOPRAZOLE SODIUM 40 MG PO TBEC: 40.0000 mg | DELAYED_RELEASE_TABLET | Freq: Two times a day (BID) | ORAL | 0 refills | Status: DC

## 2021-03-11 MED ORDER — FLUOXETINE HCL 40 MG PO CAPS: 40.0000 mg | ORAL_CAPSULE | Freq: Every day | ORAL | 0 refills | Status: DC

## 2021-03-11 MED ORDER — FUROSEMIDE 80 MG PO TABS: 80.0000 mg | ORAL_TABLET | Freq: Every day | ORAL | 0 refills | Status: DC

## 2021-03-11 NOTE — Telephone Encounter (Signed)
NA/NVM, refill sent to pharmacy

## 2021-03-11 NOTE — Telephone Encounter (Signed)
  Prescription Request  03/11/2021  What is the name of the medication or equipment? Furosemide 80 mg, Fluoxetine 40 mg, Pantoprazole 40 mg  Have you contacted your pharmacy to request a refill? (if applicable) NO   Which pharmacy would you like this sent to? [facility]   Patient notified that their request is being sent to the clinical staff for review and that they should receive a response within 2 business days.

## 2021-03-25 ENCOUNTER — Ambulatory Visit (INDEPENDENT_AMBULATORY_CARE_PROVIDER_SITE_OTHER): Payer: Medicare HMO | Admitting: Podiatry

## 2021-03-25 ENCOUNTER — Other Ambulatory Visit: Payer: Self-pay

## 2021-03-25 ENCOUNTER — Encounter: Payer: Self-pay | Admitting: Podiatry

## 2021-03-25 DIAGNOSIS — G609 Hereditary and idiopathic neuropathy, unspecified: Secondary | ICD-10-CM | POA: Diagnosis not present

## 2021-03-25 DIAGNOSIS — B351 Tinea unguium: Secondary | ICD-10-CM

## 2021-03-25 DIAGNOSIS — M79675 Pain in left toe(s): Secondary | ICD-10-CM | POA: Diagnosis not present

## 2021-03-25 DIAGNOSIS — M79674 Pain in right toe(s): Secondary | ICD-10-CM | POA: Diagnosis not present

## 2021-03-25 DIAGNOSIS — Q828 Other specified congenital malformations of skin: Secondary | ICD-10-CM | POA: Diagnosis not present

## 2021-03-30 NOTE — Progress Notes (Signed)
Subjective: [name] [name] is a pleasant 80 y.o. female patient seen today painful porokeratotic lesion(s) right foot and painful mycotic toenails that limit ambulation. Aggravating factors include weightbearing with and without shoe gear. Pain for both is relieved with periodic professional debridement.   [name] [name] states her callus is tenderl on today's visit. She voices no new pedal problems on today's visit.  PCP is Dr. Ronnie Doss. Last visit was 01/31/2021.  Allergies  Allergen Reactions  . Buprenorphine Hcl Itching  . Cymbalta [Duloxetine Hcl] Other (See Comments)    Other reaction(s): Other (See Comments) Personality changes - crying  2014  . Morphine And Related Itching  . Oxycodone-Acetaminophen Other (See Comments)    Personality changes   . Valium [Diazepam] Other (See Comments)    Personality changes - "in another world" ;  Hallucinations  2015  . Statins Other (See Comments)    Other reaction(s): Other (See Comments) Muscle weakness Muscle weakness  . Altace [Ramipril] Other (See Comments)    unknown  . Floxin [Ofloxacin] Other (See Comments)    unknown  . Hydromorphone Other (See Comments)    Other reaction(s): Confusion (intolerance) unknown  . Lovaza [Omega-3-Acid Ethyl Esters] Other (See Comments)    Muscle weakness   . Trilipix [Choline Fenofibrate] Other (See Comments)    Muscle weakness   . Zetia [Ezetimibe] Other (See Comments)    Muscle weakness    Objective: Physical Exam General: [name] [name] is a pleasant 80 y.o. Caucasian female, WD, WN in NAD. AAO x 3.   Vascular:  Neurovascular status unchanged b/l lower extremities. Capillary refill time to digits immediate b/l. Palpable DP pulse(s) b/l lower extremities Faintly palpable PT pulse(s) b/l lower extremities. Pedal hair present. Lower extremity skin temperature gradient within normal limits. No pain with calf compression b/l.  Dermatological:  Pedal skin with normal turgor, texture and  tone bilaterally. No open wounds bilaterally. No interdigital macerations bilaterally. Toenails 2-5 bilaterally elongated, discolored, dystrophic, thickened, and crumbly with subungual debris and tenderness to dorsal palpation. Anonychia noted L hallux and R hallux. Nailbed(s) epithelialized.  Porokeratotic lesion(s) submet head 5 right foot. No erythema, no edema, no drainage, no fluctuance  Musculoskeletal:  Normal muscle strength 5/5 to all lower extremity muscle groups bilaterally. No pain crepitus or joint limitation noted with ROM b/l. Hammertoes noted to the R 2nd toe.  Neurological:  Protective sensation intact 5/5 intact bilaterally with 10g monofilament b/l. Vibratory sensation diminished b/l.  Assessment and Plan:  1. Pain due to onychomycosis of toenails of both feet   2. Porokeratosis   3. Idiopathic peripheral neuropathy    -Examined patient. -No new findings. No new orders. -Continue nonformulary compounding topical antifungal: [facility] to affected toenails. -Toenails 2-5 bilaterally debrided in length and girth without iatrogenic bleeding with sterile nail nipper and dremel.  -Porokeratotic lesion(s) submet head 5 right foot pared and enucleated with sterile scalpel blade without incident.  -Patient to report any pedal injuries to medical professional immediately. -Patient to continue soft, supportive shoe gear daily. -Patient/POA to call should there be question/concern in the interim.  Return in about 3 months (around 06/24/2021).  Marzetta Board, DPM

## 2021-04-03 ENCOUNTER — Other Ambulatory Visit: Payer: Self-pay | Admitting: *Deleted

## 2021-04-07 NOTE — Telephone Encounter (Signed)
Yes, that is her cardiologist.  Please make sure to send refill to him.

## 2021-04-09 MED ORDER — DILTIAZEM HCL ER COATED BEADS 120 MG PO CP24: 120.0000 mg | ORAL_CAPSULE | Freq: Two times a day (BID) | ORAL | 3 refills | Status: DC

## 2021-04-09 NOTE — Telephone Encounter (Signed)
Refilled per Dr. Quentin Ore.

## 2021-04-09 NOTE — Addendum Note (Signed)
Addended by: Willeen Cass A on: 04/09/2021 07:17 AM   Modules accepted: Orders

## 2021-06-02 ENCOUNTER — Other Ambulatory Visit: Payer: Self-pay

## 2021-06-02 ENCOUNTER — Encounter: Payer: Self-pay | Admitting: Family Medicine

## 2021-06-02 ENCOUNTER — Ambulatory Visit: Payer: Medicare HMO | Admitting: Family Medicine

## 2021-06-02 VITALS — BP 116/76 | HR 93 | Temp 97.6°F | Ht 67.0 in | Wt 175.8 lb

## 2021-06-02 DIAGNOSIS — F321 Major depressive disorder, single episode, moderate: Secondary | ICD-10-CM

## 2021-06-02 DIAGNOSIS — I480 Paroxysmal atrial fibrillation: Secondary | ICD-10-CM | POA: Diagnosis not present

## 2021-06-02 DIAGNOSIS — G894 Chronic pain syndrome: Secondary | ICD-10-CM | POA: Diagnosis not present

## 2021-06-02 DIAGNOSIS — R413 Other amnesia: Secondary | ICD-10-CM | POA: Diagnosis not present

## 2021-06-02 NOTE — Patient Instructions (Signed)
For depression: Could consider increasing Prozac to 60mg  daily OR Adding Wellbutrin to boost Prozac.  Let me know if you choose to do either.  Consider asking Dr Nelva Bush' office about imaging of neck/ leg weakness and whether you need to see the [facility] again

## 2021-06-02 NOTE — Progress Notes (Signed)
Subjective: CC: Chronic follow-up PCP: [name], DO LKT:[id number] [name] [name] is a 80 y.o. female presenting to clinic today for:  1.  Atrial fibrillation on chronic anticoagulation Patient reports that she continues to have scant blood in her stool when she defecates.  She had a colonoscopy performed in March which did show some polyps but no overt GI bleeding or internal hemorrhoids.  Her last hemoglobin was within normal range.  She does have history of anemia in the past however.  In fact she describes an instance where she had her teeth pulled and had quite a bit of bleeding.  She ended up requiring extra topical medications to stop the bleeding at her dentist's office.  2.  Chronic pain/fall risk Patient with ongoing chronic pain that radiates down the legs.  She describes this primarily being in her back but notes that she is weak.  Her daughter is not sure if the weakness is because she is not very mobile and is losing strength or if it is in fact from her back.  She apparently was told many years ago that the stenosis in her neck would ultimately cause her to lose ability to walk.  This apparently was discussed during her [facility] medical appointment.  She is currently under the care of Dr. Nelva Bush, who has in the past given her steroid injections into the back.  She is unsure if this was helpful or not.  She continues to receive pain medication from their office and is treated with fentanyl and hydrocodone.  She uses these as directed.  She has not had any recent falls but frequently worries about falls.  Apparently there is concern about her walker not fitting through certain doors and so she has to use other walkers.  3.  Depression She reports exacerbation in her depressive symptoms because of uncontrolled chronic pain and feeling like she "cannot get out of the house".  She is tearful when talking about wanting to go to the beach and or gamble but she cannot.  She is  treated with Prozac 40 mg daily.  No reports of SI or HI   ROS: Per HPI  Allergies  Allergen Reactions   Buprenorphine Hcl Itching   Cymbalta [Duloxetine Hcl] Other (See Comments)    Other reaction(s): Other (See Comments) Personality changes - crying  2014   Morphine And Related Itching   Oxycodone-Acetaminophen Other (See Comments)    Personality changes    Valium [Diazepam] Other (See Comments)    Personality changes - "in another world" ;  Hallucinations  2015   Statins Other (See Comments)    Other reaction(s): Other (See Comments) Muscle weakness Muscle weakness   Altace [Ramipril] Other (See Comments)    unknown   Floxin [Ofloxacin] Other (See Comments)    unknown   Hydromorphone Other (See Comments)    Other reaction(s): Confusion (intolerance) unknown   Lovaza [Omega-3-Acid Ethyl Esters] Other (See Comments)    Muscle weakness    Trilipix [Choline Fenofibrate] Other (See Comments)    Muscle weakness    Zetia [Ezetimibe] Other (See Comments)    Muscle weakness    Past Medical History:  Diagnosis Date   Abnormal nuclear cardiac imaging test    Anemia    Anemia, iron deficiency 07/02/2015   Anxiety    Arthritis    "knees, back, fingers, toes; joints" (01/07/2015)   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 Abnl nuc 11/2014.  Cath 12/2014 - turned down for CABG. Ultimately s/p TTVP, rotational atherectomy, PTCA and stenting of the ostial LCx and left main into the LAD (crush technique), and IVUS of the LAD/Left main. // b. Myoview 11/17: EF 48, poor quality/significant artifact; inf-lateral, inferior ischemia; Intermediate Risk   Chronic atrial fibrillation ([place])    a.  First noted post-op 9/15 spinal fusion.  She had cardioversion, not on anticoagulation. Fall risk, unsteady. // failed DCCV // Holter 10/17: AFib, Avg HR 97, PVCs, no other arrhythmia   Chronic back pain greater than 3 months duration    a. spinal stenosis.  Spinal fusion with  rods in 2/15 at [facility] spinal fusion 9/15.   Chronic diastolic CHF    Echo 2/63:  EF 50-55%, trivial AI, midl MR, mod LAE, PASP 37 mmHg // Echocardiogram 03/2020: EF 45-50, no RWMA, mild LVH, mild reduced RVSF, mildly elevated PASP (RVSP 38.3), mild LAE, mild MR, mild to mod TR, trivial AI // Echo 9/21: EF 45, mild LVH, mildly reduced RV SF, moderate LAE, mild RAE, mild MR, mild AI      Circadian rhythm sleep disorder    CTS (carpal tunnel syndrome)    Deficiency anemia 11/10/2014   Depression    Diarrhea    Diverticulitis of colon    Esophageal stricture    Gastritis    Gastroesophageal hernia 07/06/2013   GERD (gastroesophageal reflux disease)    Hiatal hernia    History of blood transfusion    "most of them related to OR's"    HTN (hypertension)    Hyperlipidemia    IBS (irritable bowel syndrome)    Incontinence 10/13/2012   Insomnia    Ischemic chest pain ([place])    Memory disorder 33/54/5625   Metabolic syndrome 05/23/8936   MGUS (monoclonal gammopathy of unknown significance) dx'd 11/2014   a. Neg BMB 11/2014.   Multiple falls    Obesity    Obstructive sleep apnea    "have mask; don't wear it" (01/07/2015)   Orthostasis    PAT (paroxysmal atrial tachycardia) ([place])    Personal history of colonic polyps 10/25/2011 & 12/02/11   not retrieved Dr Lyla Son & tubular adenomas   Pneumonia 03/2014   Sinus bradycardia    a. Baseline HR 50s-60s.   Stroke [facility]) early 2000's   "small"; denies residual on 01/07/2015)    Current Outpatient Medications:    albuterol (VENTOLIN HFA) 108 (90 Base) MCG/ACT inhaler, Inhale 2 puffs into the lungs every 6 (six) hours as needed for wheezing or shortness of breath., Disp: 6.7 g, Rfl: 1   aspirin EC 81 MG tablet, Take 1 tablet (81 mg total) by mouth daily., Disp: 90 tablet, Rfl: 3   Cholecalciferol (VITAMIN D) 2000 units tablet, Take 2,000 Units by mouth daily., Disp: , Rfl:    clobetasol cream (TEMOVATE) 3.42 %, Apply 1 application  topically See admin instructions. Applies to vaginal area twice a week., Disp: , Rfl:    diltiazem (CARDIZEM CD) 120 MG 24 hr capsule, Take 1 capsule (120 mg total) by mouth in the morning and at bedtime., Disp: 180 capsule, Rfl: 3   diphenoxylate-atropine (LOMOTIL) 2.5-0.025 MG tablet, Take 1-2 tablets orally up to 4x per day, Disp: , Rfl:    ELIQUIS 5 MG TABS tablet, Take 1 tablet (5 mg total) by mouth 2 (two) times daily., Disp: 180 tablet, Rfl: 2   Eszopiclone 3 MG TABS, Take 1 tablet (3 mg total) by mouth at bedtime. For insomnia,take  immediately before bedtime., Disp: 90 tablet, Rfl: 1   fentaNYL (DURAGESIC - DOSED MCG/HR) 50 MCG/HR, Place 1 patch (50 mcg total) onto the skin every 3 (three) days., Disp: 10 patch, Rfl: 0   FLUoxetine (PROZAC) 40 MG capsule, Take 1 capsule (40 mg total) by mouth daily., Disp: 90 capsule, Rfl: 0   fluticasone (FLONASE) 50 MCG/ACT nasal spray, Place 1 spray into both nostrils daily., Disp: 30 g, Rfl: 2   furosemide (LASIX) 80 MG tablet, Take 1 tablet (80 mg total) by mouth daily., Disp: 90 tablet, Rfl: 0   HYDROcodone-acetaminophen (NORCO) 10-325 MG per tablet, Take 1 tablet by mouth every 4 (four) hours as needed for moderate pain. , Disp: , Rfl:    lidocaine (LIDODERM) 5 %, Place 1 patch onto the skin daily as needed ((leave on for 12 hours and leave off for 12 hours)). Remove & Discard patch within 12 hours or as directed by MD, Disp: 30 patch, Rfl: 0   Multiple Vitamin (MULTIVITAMIN) tablet, Take 1 tablet by mouth daily. For supplement, Disp: , Rfl:    nitroGLYCERIN (NITROSTAT) 0.4 MG SL tablet, Place 1 tablet (0.4 mg total) under the tongue every 5 (five) minutes as needed for chest pain (up to 3 doses)., Disp: 25 tablet, Rfl: 3   NONFORMULARY OR COMPOUNDED ITEM, Apply 1 application topically daily. Antifungal solution: Terbinafine 3%, Fluconazole 2%, Tea Tree Oil 5%, Urea 10%, Ibuprofen 2% in DMSO suspension #42m, Disp: , Rfl:    pantoprazole (PROTONIX) 40 MG  tablet, Take 1 tablet (40 mg total) by mouth 2 (two) times daily., Disp: 180 tablet, Rfl: 0   PEG 3350-KCl-NaBcb-NaCl-NaSulf (PEG-3350/ELECTROLYTES) 236 g SOLR, Take 4,000 mLs by mouth once., Disp: , Rfl:    polyethylene glycol (GOLYTELY) 236 g solution, peg 3350-electrolytes 236 gram-22.74 gram-6.74 gram-5.86 gram solution, Disp: , Rfl:    potassium chloride (KLOR-CON 10) 10 MEQ tablet, Take 2 tablets (20 mEq total) by mouth daily., Disp: 180 tablet, Rfl: 3   rosuvastatin (CRESTOR) 10 MG tablet, Take 1 tablet (10 mg total) by mouth daily., Disp: 90 tablet, Rfl: 0   triamcinolone cream (KENALOG) 0.1 %, Apply 1 application topically 3 (three) times daily. , Disp: , Rfl:  Social History   Socioeconomic History   Marital status: Widowed    Spouse name: Not on file   Number of children: 2   Years of education: HS   Highest education level: Not on file  Occupational History   Occupation: lLicensed conveyancer retired    Comment: retired  Tobacco Use   Smoking status: Never   Smokeless tobacco: Never  Vaping Use   Vaping Use: Never used  Substance and Sexual Activity   Alcohol use: No    Comment: 01/07/2015 "glass of wine at Christmas, maybe"   Drug use: No   Sexual activity: Never  Other Topics Concern   Not on file  Social History Narrative   Patient is right handed   Patient drinks 1-2 sodas daily.   patlient lives alone.   Social Determinants of Health   Financial Resource Strain: Not on file  Food Insecurity: Not on file  Transportation Needs: Not on file  Physical Activity: Not on file  Stress: Not on file  Social Connections: Not on file  Intimate Partner Violence: Not on file   Family History  Problem Relation Age of Onset   Coronary artery disease Father    Peripheral vascular disease Father    Coronary artery disease Mother    Coronary  artery disease Brother    Colon cancer Sister    Emphysema Sister    Sleep apnea Son    Colon cancer Sister        spread to her brain    Emphysema Brother    Dementia Neg Hx     Objective: Office vital signs reviewed. BP 116/76   Pulse 93   Temp 97.6 F (36.4 C)   Ht 5' 7"  (1.702 m)   Wt 175 lb 12.8 oz (79.7 kg)   SpO2 96%   BMI 27.53 kg/m   Physical Examination:  General: Awake, alert, nontoxic, No acute distress HEENT: Normal, sclera white Cardio: regular rate and rhythm, S1S2 heard, no murmurs appreciated Pulm: clear to auscultation bilaterally, no wheezes, rhonchi or rales; normal work of breathing on room air Extremities: warm, well perfused, trace ankle edema, No cyanosis or clubbing; +2 pulses bilaterally MSK: Utilizes rolling walker for ambulation.  Tone is normal Psych: Mood is extremely labile.  Eye contact is fair.  Thought process is somewhat tangential.  She does seem to hyperfocus on certain things and requires redirection sometimes. Depression screen [facility] 2/9 06/02/2021 01/31/2021 04/04/2020  Decreased Interest 3 0 2  Down, Depressed, Hopeless 3 0 1  PHQ - 2 Score 6 0 3  Altered sleeping 0 0 2  Tired, decreased energy 3 0 2  Change in appetite 0 0 0  Feeling bad or failure about yourself  3 0 1  Trouble concentrating 3 0 1  Moving slowly or fidgety/restless 2 0 1  Suicidal thoughts 0 0 0  PHQ-9 Score 17 0 10  Difficult doing work/chores - - -  Some recent data might be hidden   GAD 7 : Generalized Anxiety Score 06/02/2021  Nervous, Anxious, on Edge 1  Control/stop worrying 3  Worry too much - different things 3  Trouble relaxing 2  Restless 0  Easily annoyed or irritable 1  Afraid - awful might happen 1  Total GAD 7 Score 11  Anxiety Difficulty Somewhat difficult    Assessment/ Plan: 80 y.o. female   Paroxysmal atrial fibrillation (HCC) - Plan: CMP14+EGFR, CBC, TSH  Memory disorder  Depression, major, single episode, moderate (HCC)  Chronic pain syndrome  A. fib is stable.  We will check CBC given recent bleeding with dental procedure.  Memory continues to be an issue but I do  wonder if this stems from uncontrolled depression and polypharmacy.  We discussed consideration for advancing Prozac to 60 mg versus adding Wellbutrin.  Her daughter would like to discuss pain with the specialist first before proceeding with any changes in medication.  We discussed today ways to improve mobility and reduce fall risk including removing rugs from the home.  Her daughter will contact me as soon as they make a decision about how to go forth with her pain control.   No orders of the defined types were placed in this encounter.  No orders of the defined types were placed in this encounter.    [name], DO [place] [phone]

## 2021-06-03 ENCOUNTER — Ambulatory Visit: Payer: Medicare HMO | Admitting: Podiatry

## 2021-06-03 DIAGNOSIS — M79674 Pain in right toe(s): Secondary | ICD-10-CM | POA: Diagnosis not present

## 2021-06-03 DIAGNOSIS — G609 Hereditary and idiopathic neuropathy, unspecified: Secondary | ICD-10-CM | POA: Diagnosis not present

## 2021-06-03 DIAGNOSIS — M79675 Pain in left toe(s): Secondary | ICD-10-CM | POA: Diagnosis not present

## 2021-06-03 DIAGNOSIS — B351 Tinea unguium: Secondary | ICD-10-CM

## 2021-06-03 DIAGNOSIS — Q828 Other specified congenital malformations of skin: Secondary | ICD-10-CM | POA: Diagnosis not present

## 2021-06-03 LAB — CMP14+EGFR
ALT: 19 IU/L (ref 0–32)
AST: 22 IU/L (ref 0–40)
Albumin/Globulin Ratio: 2 (ref 1.2–2.2)
Albumin: 4.3 g/dL (ref 3.6–4.6)
Alkaline Phosphatase: 121 IU/L (ref 44–121)
BUN/Creatinine Ratio: 16 (ref 12–28)
BUN: 13 mg/dL (ref 8–27)
Bilirubin Total: 0.3 mg/dL (ref 0.0–1.2)
CO2: 25 mmol/L (ref 20–29)
Calcium: 9.2 mg/dL (ref 8.7–10.3)
Chloride: 100 mmol/L (ref 96–106)
Creatinine, Ser: 0.83 mg/dL (ref 0.57–1.00)
Globulin, Total: 2.2 g/dL (ref 1.5–4.5)
Glucose: 109 mg/dL — ABNORMAL HIGH (ref 65–99)
Potassium: 4.1 mmol/L (ref 3.5–5.2)
Sodium: 142 mmol/L (ref 134–144)
Total Protein: 6.5 g/dL (ref 6.0–8.5)
eGFR: 71 mL/min/{1.73_m2} (ref >59)

## 2021-06-03 LAB — CBC
Hematocrit: 37.5 % (ref 34.0–46.6)
Hemoglobin: 11.1 g/dL (ref 11.1–15.9)
MCH: 26.3 pg — ABNORMAL LOW (ref 26.6–33.0)
MCHC: 29.6 g/dL — ABNORMAL LOW (ref 31.5–35.7)
MCV: 89 fL (ref 79–97)
Platelets: 226 10*3/uL (ref 150–450)
RBC: 4.22 x10E6/uL (ref 3.77–5.28)
RDW: 13.2 % (ref 11.7–15.4)
WBC: 5.9 10*3/uL (ref 3.4–10.8)

## 2021-06-03 LAB — TSH: TSH: 2.52 u[IU]/mL (ref 0.450–4.500)

## 2021-06-09 ENCOUNTER — Encounter: Payer: Self-pay | Admitting: Family Medicine

## 2021-06-09 ENCOUNTER — Encounter: Payer: Self-pay | Admitting: Podiatry

## 2021-06-09 DIAGNOSIS — I480 Paroxysmal atrial fibrillation: Secondary | ICD-10-CM

## 2021-06-09 DIAGNOSIS — I2510 Atherosclerotic heart disease of native coronary artery without angina pectoris: Secondary | ICD-10-CM

## 2021-06-09 MED ORDER — ROSUVASTATIN CALCIUM 10 MG PO TABS: 10.0000 mg | ORAL_TABLET | Freq: Every day | ORAL | 0 refills | Status: DC

## 2021-06-09 MED ORDER — FLUOXETINE HCL 40 MG PO CAPS
40.0000 mg | ORAL_CAPSULE | Freq: Every day | ORAL | 0 refills | Status: DC
Start: 2021-06-09 — End: 2021-09-08

## 2021-06-09 MED ORDER — PANTOPRAZOLE SODIUM 40 MG PO TBEC: 40.0000 mg | DELAYED_RELEASE_TABLET | Freq: Two times a day (BID) | ORAL | 1 refills | Status: DC

## 2021-06-09 NOTE — Progress Notes (Signed)
Subjective: [name] [name] is a pleasant 80 y.o. female patient seen today for painful porokeratotic lesion of right foot and painful thick toenails that are difficult to trim. Pain interferes with ambulation. Aggravating factors include wearing enclosed shoe gear. Pain is relieved with periodic professional debridement.  Her daughter is present during today's visit.   PCP is [name], DO. Last visit was: 06/02/2021.  Allergies  Allergen Reactions   Buprenorphine Hcl Itching   Cymbalta [Duloxetine Hcl] Other (See Comments)    Other reaction(s): Other (See Comments) Personality changes - crying  2014   Morphine And Related Itching   Oxycodone-Acetaminophen Other (See Comments)    Personality changes    Valium [Diazepam] Other (See Comments)    Personality changes - "in another world" ;  Hallucinations  2015   Statins Other (See Comments)    Other reaction(s): Other (See Comments) Muscle weakness Muscle weakness   Altace [Ramipril] Other (See Comments)    unknown   Floxin [Ofloxacin] Other (See Comments)    unknown   Hydromorphone Other (See Comments)    Other reaction(s): Confusion (intolerance) unknown   Lovaza [Omega-3-Acid Ethyl Esters] Other (See Comments)    Muscle weakness    Trilipix [Choline Fenofibrate] Other (See Comments)    Muscle weakness    Zetia [Ezetimibe] Other (See Comments)    Muscle weakness     Objective: Physical Exam  General: [name] [name] is a pleasant 80 y.o. Caucasian female, in NAD. AAO x 3.   Vascular:  Capillary refill time to digits immediate b/l. Palpable DP pulse(s) b/l lower extremities Faintly palpable PT pulse(s) b/l lower extremities. Pedal hair present. Lower extremity skin temperature gradient within normal limits. No pain with calf compression b/l. No edema noted b/l lower extremities.  Dermatological:  Pedal skin with normal turgor, texture and tone bilaterally. No open wounds bilaterally. No interdigital macerations  bilaterally. Toenails 2-5 bilaterally elongated, discolored, dystrophic, thickened, and crumbly with subungual debris and tenderness to dorsal palpation. Anonychia noted L hallux and R hallux. Nailbed(s) epithelialized.  Porokeratotic lesion(s) submet head 5 right foot. No erythema, no edema, no drainage, no fluctuance.  Musculoskeletal:  Normal muscle strength 5/5 to all lower extremity muscle groups bilaterally. No pain crepitus or joint limitation noted with ROM b/l. Hammertoe(s) noted to the R 2nd toe.  Neurological:  Pt has subjective symptoms of neuropathy. Protective sensation intact 5/5 intact bilaterally with 10g monofilament b/l.  Assessment and Plan:  1. Pain due to onychomycosis of toenails of both feet   2. Porokeratosis   3. Idiopathic peripheral neuropathy     -Examined patient. -Continue diabetic foot care principles. -Patient to continue soft, supportive shoe gear daily. -Toenails 2-5 bilaterally debrided in length and girth without iatrogenic bleeding with sterile nail nipper and dremel.  -Painful porokeratotic lesion(s) submet head 5 right foot pared and enucleated with sterile scalpel blade without incident. Total number of lesions debrided=1. -Patient to report any pedal injuries to medical professional immediately. -Patient/POA to call should there be question/concern in the interim.  Return in about 9 weeks (around 08/05/2021).  Marzetta Board, DPM

## 2021-06-10 ENCOUNTER — Other Ambulatory Visit: Payer: Self-pay | Admitting: Family Medicine

## 2021-06-10 NOTE — Telephone Encounter (Signed)
I would double check with patient but it was listed on her active meds as of our most recent visit.  She has had statin intolerance in the past but this dose was lowered from 20mg  to 10mg  and to my knowledge she's tolerated this.

## 2021-06-11 NOTE — Telephone Encounter (Signed)
TC to patient, she states she tolerates the 10 mg dose of crestor. Went ahead & refilled with note to pharmacy: no change pt tolerates 10 mg dose

## 2021-06-24 MED ORDER — FUROSEMIDE 80 MG PO TABS: 80.0000 mg | ORAL_TABLET | Freq: Every day | ORAL | 0 refills | Status: DC

## 2021-06-24 NOTE — Addendum Note (Signed)
Addended by: Michaela Corner on: 06/24/2021 07:27 AM   Modules accepted: Orders

## 2021-07-21 ENCOUNTER — Encounter: Payer: Self-pay | Admitting: Physician Assistant

## 2021-07-21 ENCOUNTER — Ambulatory Visit: Payer: Medicare HMO | Admitting: Physician Assistant

## 2021-07-21 ENCOUNTER — Other Ambulatory Visit: Payer: Self-pay

## 2021-07-21 VITALS — BP 98/40 | HR 100 | Ht 66.0 in | Wt 178.2 lb

## 2021-07-21 DIAGNOSIS — I10 Essential (primary) hypertension: Secondary | ICD-10-CM

## 2021-07-21 DIAGNOSIS — I4821 Permanent atrial fibrillation: Secondary | ICD-10-CM

## 2021-07-21 DIAGNOSIS — I2510 Atherosclerotic heart disease of native coronary artery without angina pectoris: Secondary | ICD-10-CM | POA: Diagnosis not present

## 2021-07-21 DIAGNOSIS — I5020 Unspecified systolic (congestive) heart failure: Secondary | ICD-10-CM

## 2021-07-21 DIAGNOSIS — Z7901 Long term (current) use of anticoagulants: Secondary | ICD-10-CM | POA: Diagnosis not present

## 2021-07-21 DIAGNOSIS — E782 Mixed hyperlipidemia: Secondary | ICD-10-CM

## 2021-07-21 NOTE — Patient Instructions (Signed)
Medication Instructions:   Your physician recommends that you continue on your current medications as directed. Please refer to the Current Medication list given to you today.  *If you need a refill on your cardiac medications before your next appointment, please call your pharmacy*   Lab Work: TODAY!!! LIPIDS/CBC   If you have labs (blood work) drawn today and your tests are completely normal, you will receive your results only by: [employer] (if you have MyChart) OR A paper copy in the mail If you have any lab test that is abnormal or we need to change your treatment, we will call you to review the results.   Testing/Procedures:  -NONE   Follow-Up: At [facility], you and your health needs are our priority.  As part of our continuing mission to provide you with exceptional heart care, we have created designated Provider Care Teams.  These Care Teams include your primary Cardiologist (physician) and Advanced Practice Providers (APPs -  Physician Assistants and Nurse Practitioners) who all work together to provide you with the care you need, when you need it.  We recommend signing up for the patient portal called "MyChart".  Sign up information is provided on this After Visit Summary.  MyChart is used to connect with patients for Virtual Visits (Telemedicine).  Patients are able to view lab/test results, encounter notes, upcoming appointments, etc.  Non-urgent messages can be sent to your provider as well.   To learn more about what you can do with MyChart, go to [email or web address].    Your next appointment:   6 months on Friday February 3 @ 2:40 pm with Dr. Burt Knack.   The format for your next appointment:   In Person  Provider:   Sherren Mocha, MD   Other Instructions If you weight is up 3 lbs in 1 day take one extra lasix ( 40 mg) can call the office @ [phone] or send a mychart message.   Two Gram Sodium Diet 2000 mg  What is Sodium? Sodium is a mineral  found naturally in many foods. The most significant source of sodium in the diet is table salt, which is about 40% sodium.  Processed, convenience, and preserved foods also contain a large amount of sodium.  The body needs only 500 mg of sodium daily to function,  A normal diet provides more than enough sodium even if you do not use salt.  Why Limit Sodium? A build up of sodium in the body can cause thirst, increased blood pressure, shortness of breath, and water retention.  Decreasing sodium in the diet can reduce edema and risk of heart attack or stroke associated with high blood pressure.  Keep in mind that there are many other factors involved in these health problems.  Heredity, obesity, lack of exercise, cigarette smoking, stress and what you eat all play a role.  General Guidelines: Do not add salt at the table or in cooking.  One teaspoon of salt contains over 2 grams of sodium. Read food labels Avoid processed and convenience foods Ask your dietitian before eating any foods not dicussed in the menu planning guidelines Consult your physician if you wish to use a salt substitute or a sodium containing medication such as antacids.  Limit milk and milk products to 16 oz (2 cups) per day.  Shopping Hints: READ LABELS!! "Dietetic" does not necessarily mean low sodium. Salt and other sodium ingredients are often added to foods during processing.    Menu Planning Guidelines Food Group Choose  More Often Avoid  Beverages (see also the milk group All fruit juices, low-sodium, salt-free vegetables juices, low-sodium carbonated beverages Regular vegetable or tomato juices, commercially softened water used for drinking or cooking  Breads and Cereals Enriched white, wheat, rye and pumpernickel bread, hard rolls and dinner rolls; muffins, cornbread and waffles; most dry cereals, cooked cereal without added salt; unsalted crackers and breadsticks; low sodium or homemade bread crumbs Bread, rolls and  crackers with salted tops; quick breads; instant hot cereals; pancakes; commercial bread stuffing; self-rising flower and biscuit mixes; regular bread crumbs or cracker crumbs  Desserts and Sweets Desserts and sweets mad with mild should be within allowance Instant pudding mixes and cake mixes  Fats Butter or margarine; vegetable oils; unsalted salad dressings, regular salad dressings limited to 1 Tbs; light, sour and heavy cream Regular salad dressings containing bacon fat, bacon bits, and salt pork; snack dips made with instant soup mixes or processed cheese; salted nuts  Fruits Most fresh, frozen and canned fruits Fruits processed with salt or sodium-containing ingredient (some dried fruits are processed with sodium sulfites        Vegetables Fresh, frozen vegetables and low- sodium canned vegetables Regular canned vegetables, sauerkraut, pickled vegetables, and others prepared in brine; frozen vegetables in sauces; vegetables seasoned with ham, bacon or salt pork  Condiments, Sauces, Miscellaneous  Salt substitute with physician's approval; pepper, herbs, spices; vinegar, lemon or lime juice; hot pepper sauce; garlic powder, onion powder, low sodium soy sauce (1 Tbs.); low sodium condiments (ketchup, chili sauce, mustard) in limited amounts (1 tsp.) fresh ground horseradish; unsalted tortilla chips, pretzels, potato chips, popcorn, salsa (1/4 cup) Any seasoning made with salt including garlic salt, celery salt, onion salt, and seasoned salt; sea salt, rock salt, kosher salt; meat tenderizers; monosodium glutamate; mustard, regular soy sauce, barbecue, sauce, chili sauce, teriyaki sauce, steak sauce, Worcestershire sauce, and most flavored vinegars; canned gravy and mixes; regular condiments; salted snack foods, olives, picles, relish, horseradish sauce, catsup   Food preparation: Try these seasonings Meats:    Pork Sage, onion Serve with applesauce  Chicken Poultry seasoning, thyme, parsley  Serve with cranberry sauce  Lamb Curry powder, rosemary, garlic, thyme Serve with mint sauce or jelly  Veal Marjoram, basil Serve with current jelly, cranberry sauce  Beef Pepper, bay leaf Serve with dry mustard, unsalted chive butter  Fish [place] leaf, dill Serve with unsalted lemon butter, unsalted parsley butter  Vegetables:    Asparagus Lemon juice   Broccoli Lemon juice   Carrots Mustard dressing parsley, mint, nutmeg, glazed with unsalted butter and sugar   Green beans Marjoram, lemon juice, nutmeg,dill seed   Tomatoes Basil, marjoram, onion   Spice /blend for [employer]" 4 tsp ground thyme 1 tsp ground sage 3 tsp ground rosemary 4 tsp ground marjoram   Test your knowledge A product that says "Salt Free" may still contain sodium. True or False Garlic Powder and Hot Pepper Sauce an be used as alternative seasonings.True or False Processed foods have more sodium than fresh foods.  True or False Canned Vegetables have less sodium than froze True or False   WAYS TO DECREASE YOUR SODIUM INTAKE Avoid the use of added salt in cooking and at the table.  Table salt (and other prepared seasonings which contain salt) is probably one of the greatest sources of sodium in the diet.  Unsalted foods can gain flavor from the sweet, sour, and butter taste sensations of herbs and spices.  Instead of using salt  for seasoning, try the following seasonings with the foods listed.  Remember: how you use them to enhance natural food flavors is limited only by your creativity... Allspice-Meat, fish, eggs, fruit, peas, red and yellow vegetables Almond Extract-Fruit baked goods Anise Seed-Sweet breads, fruit, carrots, beets, cottage cheese, cookies (tastes like licorice) Basil-Meat, fish, eggs, vegetables, rice, vegetables salads, soups, sauces Bay Leaf-Meat, fish, stews, poultry Burnet-Salad, vegetables (cucumber-like flavor) Caraway Seed-Bread, cookies, cottage cheese, meat, vegetables, cheese,  rice Cardamon-Baked goods, fruit, soups Celery Powder or seed-Salads, salad dressings, sauces, meatloaf, soup, bread.Do not use  celery salt Chervil-Meats, salads, fish, eggs, vegetables, cottage cheese (parsley-like flavor) Chili Power-Meatloaf, chicken cheese, corn, eggplant, egg dishes Chives-Salads cottage cheese, egg dishes, soups, vegetables, sauces Cilantro-Salsa, casseroles Cinnamon-Baked goods, fruit, pork, lamb, chicken, carrots Cloves-Fruit, baked goods, fish, pot roast, green beans, beets, carrots Coriander-Pastry, cookies, meat, salads, cheese (lemon-orange flavor) Cumin-Meatloaf, fish,cheese, eggs, cabbage,fruit pie (caraway flavor) [employer], fruit, eggs, fish, poultry, cottage cheese, vegetables Dill Seed-Meat, cottage cheese, poultry, vegetables, fish, salads, bread Fennel Seed-Bread, cookies, apples, pork, eggs, fish, beets, cabbage, cheese, Licorice-like flavor Garlic-(buds or powder) Salads, meat, poultry, fish, bread, butter, vegetables, potatoes.Do not  use garlic salt Ginger-Fruit, vegetables, baked goods, meat, fish, poultry Horseradish Root-Meet, vegetables, butter Lemon Juice or Extract-Vegetables, fruit, tea, baked goods, fish salads Mace-Baked goods fruit, vegetables, fish, poultry (taste like nutmeg) Maple Extract-Syrups Marjoram-Meat, chicken, fish, vegetables, breads, green salads (taste like Sage) Mint-Tea, lamb, sherbet, vegetables, desserts, carrots, cabbage Mustard, Dry or Seed-Cheese, eggs, meats, vegetables, poultry Nutmeg-Baked goods, fruit, chicken, eggs, vegetables, desserts Onion Powder-Meat, fish, poultry, vegetables, cheese, eggs, bread, rice salads (Do not use   Onion salt) Orange Extract-Desserts, baked goods Oregano-Pasta, eggs, cheese, onions, pork, lamb, fish, chicken, vegetables, green salads Paprika-Meat, fish, poultry, eggs, cheese, vegetables Parsley Flakes-Butter, vegetables, meat fish, poultry, eggs, bread, salads (certain  forms may   Contain sodium Pepper-Meat fish, poultry, vegetables, eggs Peppermint Extract-Desserts, baked goods Poppy Seed-Eggs, bread, cheese, fruit dressings, baked goods, noodles, vegetables, cottage  [employer], poultry, meat, fish, cauliflower, turnips,eggs bread Saffron-Rice, bread, veal, chicken, fish, eggs Sage-Meat, fish, poultry, onions, eggplant, tomateos, pork, stews Savory-Eggs, salads, poultry, meat, rice, vegetables, soups, pork Tarragon-Meat, poultry, fish, eggs, butter, vegetables (licorice-like flavor)  Thyme-Meat, poultry, fish, eggs, vegetables, (clover-like flavor), sauces, soups Tumeric-Salads, butter, eggs, fish, rice, vegetables (saffron-like flavor) Vanilla Extract-Baked goods, candy Vinegar-Salads, vegetables, meat marinades Walnut Extract-baked goods, candy   2. Choose your Foods Wisely   The following is a list of foods to avoid which are high in sodium:  Meats-Avoid all smoked, canned, salt cured, dried and kosher meat and fish as well as Anchovies   [employer] meats:Bologna, Liverwurst, Pastrami Canned meat or fish  Marinated herring Caviar    Pepperoni Corned Beef   Pizza Dried chipped beef  Salami Frozen breaded fish or meat Salt pork Frankfurters or hot dogs  Sardines Gefilte fish   Sausage Ham (boiled ham, Proscuitto Smoked butt    spiced ham)   Spam      TV Dinners Vegetables Canned vegetables (Regular) Relish Canned mushrooms  Sauerkraut Olives    Tomato juice Pickles  Bakery and Dessert Products Canned puddings  Cream pies Cheesecake   Decorated cakes Cookies  Beverages/Juices Tomato juice, regular  Gatorade   V-8 vegetable juice, regular  Breads and Cereals Biscuit mixes   Salted potato chips, corn chips, pretzels Bread stuffing mixes  Salted crackers and rolls Pancake and waffle mixes Self-rising flour  Seasonings  Accent    Meat sauces Barbecue sauce  Meat  tenderizer Catsup    Monosodium glutamate (MSG) Celery salt   Onion salt Chili sauce   Prepared mustard Garlic salt   Salt, seasoned salt, sea salt Gravy mixes   Soy sauce Horseradish   Steak sauce Ketchup   Tartar sauce Lite salt    Teriyaki sauce Marinade mixes   Worcestershire sauce  Others Baking powder   Cocoa and cocoa mixes Baking soda   Commercial casserole mixes Candy-caramels, chocolate  Dehydrated soups    Bars, fudge,nougats  Instant rice and pasta mixes Canned broth or soup  Maraschino cherries Cheese, aged and processed cheese and cheese spreads  Learning Assessment Quiz  Indicated T (for True) or F (for False) for each of the following statements:  _____ Fresh fruits and vegetables and unprocessed grains are generally low in sodium _____ Water may contain a considerable amount of sodium, depending on the source _____ You can always tell if a food is high in sodium by tasting it _____ Certain laxatives my be high in sodium and should be avoided unless prescribed   by a physician or pharmacist _____ Salt substitutes may be used freely by anyone on a sodium restricted diet _____ Sodium is present in table salt, food additives and as a natural component of   most foods _____ Table salt is approximately 90% sodium _____ Limiting sodium intake may help prevent excess fluid accumulation in the body _____ On a sodium-restricted diet, seasonings such as bouillon soy sauce, and    cooking wine should be used in place of table salt _____ On an ingredient list, a product which lists monosodium glutamate as the first   ingredient is an appropriate food to include on a low sodium diet  Circle the best answer(s) to the following statements (Hint: there may be more than one correct answer)  11. On a low-sodium diet, some acceptable snack items are:    A. Olives  F. Bean dip   K. Grapefruit juice    B. Salted Pretzels G. Commercial Popcorn   L. Canned peaches    C. Carrot  Sticks  H. Bouillon   M. Unsalted nuts   D. [place] fries  I. Peanut butter crackers N. Salami   E. Sweet pickles J. Tomato Juice   O. Pizza  12.  Seasonings that may be used freely on a reduced - sodium diet include   A. Lemon wedges F.Monosodium glutamate K. Celery seed    B.Soysauce   G. Pepper   L. Mustard powder   C. Sea salt  H. Cooking wine  M. Onion flakes   D. Vinegar  E. Prepared horseradish N. Salsa   E. Sage   J. Worcestershire sauce  O. Chutney

## 2021-07-21 NOTE — Progress Notes (Addendum)
Cardiology Office Note:    Date:  07/21/2021   ID:  [name] [name], DOB [birth year], MRN [id number]  PCP:  [name] Norlander, DO   [facility] Providers Cardiologist:  [name] Mocha, MD Cardiology APP:  [name] Shi, PA-C  Electrophysiologist:  [name] Epley, MD      Referring MD: [name] Norlander, DO   Chief Complaint:  Follow-up (CAD, CHF, AF)    Patient Profile:    [name] [name] is a 80 y.o. female with:  Coronary artery disease Cath 12/15: Complex bifurcational distal LM/ostial LCx disease - poor CABG candidate PCI 12/15: DES to distal LM and ostial LCx Myoview 11/17: + Inferior ischemia Cath 12/17: Patent LM/LCx stent; no significant RCA disease Myoview 6/21: No ischemia Primarily Diastolic CHF Echocardiogram 4/21: EF 45-50, mild LVH, mild MR, mild-mod TR, trivial AI Echo 8/21: EF 45>>Dilt DC'd due to low EF>>resumed by EP in 10/21 Permanent atrial fibrillation S/p DCCV 1/17 >> ERAF  >> rate control strategy Intol of beta-blocker, digoxin  Eval by EP (Dr. Quentin [name]) in 10/21>>EF felt to be fairly stable>>Diltiazem resumed Monitor 10/21: Avg HR 92 Hypertension Hyperlipidemia S/p multiple back surgeries; managed with chronic narcotic pain medication Hx of orthostatic hypotension; s/p multiple falls in the past Hepatic steatosis (elevated LFTs) MGUS Hx of CVA   Prior CV studies: Cardiac Monitor 09/2020 The basic rhythm is atrial fibrillation with an average HR of 92 bpm.  Atrial fibrillation is present throughout with no sinus rhythm. No high-grade heart block or pathologic pauses There are occasional PVC's  Echocardiogram 08/20/2020  1. Left ventricular ejection fraction, by estimation, is 45% with beat to  beat variability. The left ventricle has mildly decreased function. There  is mild left ventricular hypertrophy. Left ventricular diastolic  parameters are indeterminate.   2. Right ventricular systolic function is mildly reduced. The right   ventricular size is normal.   3. Left atrial size was moderately dilated, visually estimated.   4. Right atrial size was mildly dilated, visually estimated.   5. The mitral valve is degenerative. Mild mitral valve regurgitation.   6. The aortic valve is grossly normal. Aortic valve regurgitation is  mild.   Myoview 05/29/20 EF 44, no ischemia, intermediate risk due to low EF   Echocardiogram 04/15/2020 EF 45-50, no R WMA, mild LVH, mildly reduced RV SF, mild LAE, mild MR, mild-moderate TR, trivial AI, RVSP 38.3   Echocardiogram 11/24/2018 EF 50-55, normal wall motion, trivial AI, trivial MR, severe LAE, moderate RAE, mild TR, PASP 35   ABIs 01/18/2018 Final Interpretation: Right: Resting right ankle-brachial index is within normal range. No evidence of significant right lower extremity arterial disease. The right toe-brachial index is normal.   Left: Resting left ankle-brachial index is within normal range. No evidence of significant left lower extremity arterial disease. The left toe-brachial index is normal.   Echocardiogram 12/22/2017 EF 55-60, normal wall motion, mild MAC, trivial MR, severe LAE, normal RV SF, mild TR, PASP 31   UE Vascular US 12/17 Patent right radial artery and veins, without evidence of fistula or pseudoaneurysm. Probable hematoma anterior to the distal right radial artery.   LHC 11/25/16 LM stent patent (stent extends from LM into pLAD) LAD irregs LCx stent patent with 25 RCA mid 40   Myoview 11/19/16 EF 48, poor quality/significant artifact; inf-lateral, inferior ischemia; Intermediate Risk   Holter 09/10/16 The basic rhythm is atrial fibrillation with average HR 97 bpm There are occasional ventricular ectopics No other arrhythmia identified  No bradycardic events   Holter 3/17 The baseline rhythm is atrial fibrillation The average ventricular rate is 96 bpm There are periods of rapid atrial fib and slow atrial fib with pauses up to 3 seconds There  are single PVC's or aberrated beats with a rare ventricular couplet   Echo 01/29/16 EF 50-55%, trivial AI, mild MR, mod LAE, PASP 37 mmHg   LHC 1/16 LM dist ? 50% LAD prox 30% - FFR 0.82 LCx ostial 70% - FFR 0.86 >> 0.66 RCA mid 40% CABG deferred b/c of poor functional capacity PCI:  Complex bifurcational distal LM and ostial LCx with rotational atherectomy and IVUS guided with 2.75 x 12 mm Synergy DES to ostial LCx;  4 x 16 mm Synergy DES to distal LM   Myoview 12/15   Intermediate risk stress nuclear study Moderate area of anteroapical and anteroseptal ischemia Also inferolateral wall infarct with moderate peri infarct ischemia  Study suggests multi vessel diseae.  LV Ejection Fraction: 57%   Echo 7/11 Mild LVH, EF 55-60%, no RWMA, mild MR   History of Present Illness: [name] [name] was last seen by Dr. Burt [name] in 1/22.  At that visit, it was felt that she would be high risk for back surgery.  However, if she required low risk surgery such as implantation of a stimulator for pain control, that would probably be reasonable.  She returns for follow-up.  She is here with her daughter.  She has not had chest pain, syncope.  She tends to sleep on a slight incline.  Recently, she had increased lower extremity swelling.  She did not increase her furosemide.  Her swelling resolved on its own.  She does eat a diet rich in salt.  She eats a lot of pizza, [place] food, potato chips and peanut butter crackers.        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 Abnl nuc 11/2014. Cath 12/2014 - turned down for CABG. Ultimately s/p TTVP, rotational atherectomy, PTCA and stenting of the ostial LCx and left main into the LAD (crush technique), and IVUS of the LAD/Left main. // b.  Myoview 11/17: EF 48, poor quality/significant artifact; inf-lateral, inferior ischemia; Intermediate Risk   Chronic atrial fibrillation ([place])    a.  First noted post-op 9/15 spinal fusion.  She had cardioversion, not on anticoagulation. Fall risk, unsteady. // failed DCCV // Holter 10/17: AFib, Avg HR 97, PVCs, no other arrhythmia   Chronic back pain greater than 3 months duration    a. spinal stenosis.  Spinal fusion with rods in 2/15 at [facility] spinal fusion 9/15.   Chronic diastolic CHF    Echo 7/61:  EF 50-55%, trivial AI, midl MR, mod LAE, PASP 37 mmHg // Echocardiogram 03/2020: EF 45-50, no RWMA, mild LVH, mild reduced RVSF, mildly elevated PASP (RVSP 38.3), mild LAE, mild MR, mild to mod TR, trivial AI // Echo 9/21: EF 45, mild LVH, mildly reduced RV SF, moderate LAE, mild RAE, mild MR, mild AI      Circadian rhythm sleep disorder    CTS (carpal tunnel syndrome)    Deficiency anemia 11/10/2014   Depression    Diarrhea    Diverticulitis of colon  Esophageal stricture    Gastritis    Gastroesophageal hernia 07/06/2013   GERD (gastroesophageal reflux disease)    Hiatal hernia    History of blood transfusion    "most of them related to OR's"    HTN (hypertension)    Hyperlipidemia    IBS (irritable bowel syndrome)    Incontinence 10/13/2012   Insomnia    Ischemic chest pain ([place])    Memory disorder 29/92/4268   Metabolic syndrome 02/21/1961   MGUS (monoclonal gammopathy of unknown significance) dx'd 11/2014   a. Neg BMB 11/2014.   Multiple falls    Obesity    Obstructive sleep apnea    "have mask; don't wear it" (01/07/2015)   Orthostasis    PAT (paroxysmal atrial tachycardia) ([place])    Personal history of colonic polyps 10/25/2011 & 12/02/11   not retrieved Dr Lyla Son & tubular adenomas   Pneumonia 03/2014   Sinus bradycardia    a. Baseline HR 50s-60s.   Stroke [facility]) early 2000's   "small"; denies residual on 01/07/2015)    Current Medications: Current Meds   Medication Sig   albuterol (VENTOLIN HFA) 108 (90 Base) MCG/ACT inhaler Inhale 2 puffs into the lungs every 6 (six) hours as needed for wheezing or shortness of breath.   aspirin EC 81 MG tablet Take 1 tablet (81 mg total) by mouth daily.   Cholecalciferol (VITAMIN D) 2000 units tablet Take 2,000 Units by mouth daily.   clobetasol cream (TEMOVATE) 2.29 % Apply 1 application topically See admin instructions. Applies to vaginal area twice a week.   diltiazem (TIAZAC) 120 MG 24 hr capsule diltiazem CD 120 mg capsule,extended release 24 hr  TAKE 1 CAPSULE (120 MG TOTAL) BY MOUTH IN THE MORNING AND AT BEDTIME.   diphenoxylate-atropine (LOMOTIL) 2.5-0.025 MG tablet Take 1-2 tablets orally up to 4x per day   ELIQUIS 5 MG TABS tablet Take 1 tablet (5 mg total) by mouth 2 (two) times daily.   Eszopiclone 3 MG TABS Take 1 tablet (3 mg total) by mouth at bedtime. For insomnia,take immediately before bedtime.   fentaNYL (DURAGESIC - DOSED MCG/HR) 50 MCG/HR Place 1 patch (50 mcg total) onto the skin every 3 (three) days.   FLUoxetine (PROZAC) 40 MG capsule Take 1 capsule (40 mg total) by mouth daily.   furosemide (LASIX) 80 MG tablet Take 1 tablet (80 mg total) by mouth daily.   HYDROcodone-acetaminophen (NORCO) 10-325 MG per tablet Take 1 tablet by mouth every 4 (four) hours as needed for moderate pain.    LINZESS 145 MCG CAPS capsule Take 145 mcg by mouth as needed (constipation).   Multiple Vitamin (MULTIVITAMIN) tablet Take 1 tablet by mouth daily. For supplement   nitroGLYCERIN (NITROSTAT) 0.4 MG SL tablet Place 1 tablet (0.4 mg total) under the tongue every 5 (five) minutes as needed for chest pain (up to 3 doses).   NONFORMULARY OR COMPOUNDED ITEM Apply 1 application topically daily. Antifungal solution: Terbinafine 3%, Fluconazole 2%, Tea Tree Oil 5%, Urea 10%, Ibuprofen 2% in DMSO suspension #14m   pantoprazole (PROTONIX) 40 MG tablet Take 1 tablet (40 mg total) by mouth 2 (two) times daily.    potassium chloride (KLOR-CON) 10 MEQ tablet Take 10 mEq by mouth daily.   rosuvastatin (CRESTOR) 10 MG tablet Patient takes one tablet by mouth (10 mg) tablet alternating every other day with one half tablet by mouth (5 mg)tablet.   triamcinolone cream (KENALOG) 0.1 % Apply 1 application topically 3 (three) times daily.    [  DISCONTINUED] potassium chloride (KLOR-CON 10) 10 MEQ tablet Take 2 tablets (20 mEq total) by mouth daily.     Allergies:   Buprenorphine hcl, Cymbalta [duloxetine hcl], Morphine and related, Oxycodone-acetaminophen, Valium [diazepam], Statins, Altace [ramipril], Floxin [ofloxacin], Hydromorphone, Lovaza [omega-3-acid ethyl esters], Trilipix [choline fenofibrate], and Zetia [ezetimibe]   Social History   Tobacco Use   Smoking status: Never   Smokeless tobacco: Never  Vaping Use   Vaping Use: Never used  Substance Use Topics   Alcohol use: No    Comment: 01/07/2015 "glass of wine at Christmas, maybe"   Drug use: No     Family Hx: The patient's family history includes Colon cancer in her sister and sister; Coronary artery disease in her brother, father, and mother; Emphysema in her brother and sister; Peripheral vascular disease in her father; Sleep apnea in her son. There is no history of Dementia.  Review of Systems  Gastrointestinal:  Positive for hematochezia (2/2 hard BM/constipation).    EKGs/Labs/Other Test Reviewed:    EKG:  EKG is not ordered today.  The ekg ordered today demonstrates N/A  Recent Labs: 06/02/2021: ALT 19; BUN 13; Creatinine, Ser 0.83; Hemoglobin 11.1; Platelets 226; Potassium 4.1; Sodium 142; TSH 2.520   Recent Lipid Panel Lab Results  Component Value Date/Time   CHOL 138 07/20/2019 12:52 PM   CHOL 156 05/11/2013 03:36 PM   TRIG 123 07/20/2019 12:52 PM   TRIG 121 02/18/2016 03:04 PM   TRIG 239 (H) 05/11/2013 03:36 PM   HDL 61 07/20/2019 12:52 PM   HDL 57 02/18/2016 03:04 PM   HDL 43 05/11/2013 03:36 PM   LDLCALC 52 07/20/2019  12:52 PM   LDLCALC 50 08/21/2014 11:41 AM   LDLCALC 65 05/11/2013 03:36 PM      Risk Assessment/Calculations:    CHA2DS2-VASc Score = 5  This indicates a 7.2% annual risk of stroke. The patient's score is based upon: CHF History: Yes HTN History: No Diabetes History: No Stroke History: No Vascular Disease History: Yes Age Score: 2 Gender Score: 1     Physical Exam:    VS:  BP (!) 98/40   Pulse 100   Ht _0  (1.676 m)   Wt 178 lb 3.2 oz (80.8 kg)   SpO2 95%   BMI 28.76 kg/m     Wt Readings from Last 3 Encounters:  07/21/21 178 lb 3.2 oz (80.8 kg)  06/02/21 175 lb 12.8 oz (79.7 kg)  01/31/21 185 lb (83.9 kg)     Constitutional:      Appearance: Healthy appearance. Not in distress.  Neck:     Vascular: No JVR.  Pulmonary:     Effort: Pulmonary effort is normal.     Breath sounds: No wheezing. No rales.  Cardiovascular:     Normal rate. Irregularly irregular rhythm. Normal S1. Normal S2.      Murmurs: There is no murmur.  Edema:    Peripheral edema present.    Ankle: bilateral trace edema of the ankle. Abdominal:     Palpations: Abdomen is soft.  Musculoskeletal:     Cervical back: Neck supple. Skin:    General: Skin is warm and dry.  Neurological:     Mental Status: Alert and oriented to person, place and time.     Cranial Nerves: Cranial nerves are intact.         ASSESSMENT & PLAN:    1. Coronary artery disease involving native coronary artery of native heart without angina pectoris History of  bifurcational PCI with DES to the distal left main and ostial LCx in January 2016.  Cardiac catheterization in 2017 demonstrated patent stents.    Myoview in June 2021 neg for ischemia.  She is doing well without anginal symptoms.  Continue current dose of rosuvastatin.  She is not on aspirin as she is on Apixaban.  2. Permanent atrial fibrillation ([place]) 3. Chronic anticoagulation Fair heart rate control.  She was not able to tolerate beta-blocker or digoxin.   She remains on diltiazem.  She is tolerating anticoagulation.  She does have occasional bright red blood per rectum related to hard bowel movements with constipation.  Recent creatinine normal.  Obtain follow-up CBC.  Continue current dose of Apixaban.    4. HFmrEF (heart failure with mildly reduced EF) EF 45 by echocardiogram 8/21.  NYHA IIb-III.  She is mainly limited by her chronic back issues.  Volume status appears stable.  She did have some increased lower extremity swelling over the last couple of weeks.  This resolved on its own.  We discussed the importance of daily weights and when to take extra furosemide.  She does have a diet rich in salt.  I have also discussed a low-salt diet with her today as well as provided her some information.  5. Essential hypertension The patient's blood pressure is controlled on her current regimen.  Continue current therapy.   6. Mixed hyperlipidemia Continue statin therapy.  She is fasting and I will obtain follow-up fasting lipids today.   Dispo:  Return in about 6 months (around 01/21/2022) for Routine Follow Up, w/ Dr. Burt [name].   Medication Adjustments/Labs and Tests Ordered: Current medicines are reviewed at length with the patient today.  Concerns regarding medicines are outlined above.  Tests Ordered: Orders Placed This Encounter  Procedures   Lipid Profile   CBC   Medication Changes: No orders of the defined types were placed in this encounter.   Signed, Richardson Dopp, PA-C  07/21/2021 4:51 PM    [facility] [place], [place], [place]  [address] Phone: [phone]; Fax: [phone]

## 2021-07-22 ENCOUNTER — Other Ambulatory Visit: Payer: Self-pay | Admitting: *Deleted

## 2021-07-22 DIAGNOSIS — Z7901 Long term (current) use of anticoagulants: Secondary | ICD-10-CM

## 2021-07-22 LAB — LIPID PANEL
Chol/HDL Ratio: 2 ratio (ref 0.0–4.4)
Cholesterol, Total: 138 mg/dL (ref 100–199)
HDL: 70 mg/dL (ref >39)
LDL Chol Calc (NIH): 47 mg/dL (ref 0–99)
Triglycerides: 121 mg/dL (ref 0–149)
VLDL Cholesterol Cal: 21 mg/dL (ref 5–40)

## 2021-07-22 LAB — CBC
Hematocrit: 34 % (ref 34.0–46.6)
Hemoglobin: 10.7 g/dL — ABNORMAL LOW (ref 11.1–15.9)
MCH: 26.7 pg (ref 26.6–33.0)
MCHC: 31.5 g/dL (ref 31.5–35.7)
MCV: 85 fL (ref 79–97)
Platelets: 229 10*3/uL (ref 150–450)
RBC: 4.01 x10E6/uL (ref 3.77–5.28)
RDW: 13.8 % (ref 11.7–15.4)
WBC: 6.8 10*3/uL (ref 3.4–10.8)

## 2021-07-30 DIAGNOSIS — M65341 Trigger finger, right ring finger: Secondary | ICD-10-CM | POA: Insufficient documentation

## 2021-08-05 ENCOUNTER — Other Ambulatory Visit: Payer: Self-pay

## 2021-08-05 ENCOUNTER — Other Ambulatory Visit: Payer: Medicare HMO | Admitting: *Deleted

## 2021-08-05 DIAGNOSIS — Z7901 Long term (current) use of anticoagulants: Secondary | ICD-10-CM

## 2021-08-06 LAB — CBC
Hematocrit: 34.3 % (ref 34.0–46.6)
Hemoglobin: 10.7 g/dL — ABNORMAL LOW (ref 11.1–15.9)
MCH: 26 pg — ABNORMAL LOW (ref 26.6–33.0)
MCHC: 31.2 g/dL — ABNORMAL LOW (ref 31.5–35.7)
MCV: 83 fL (ref 79–97)
Platelets: 238 10*3/uL (ref 150–450)
RBC: 4.12 x10E6/uL (ref 3.77–5.28)
RDW: 14.4 % (ref 11.7–15.4)
WBC: 6.8 10*3/uL (ref 3.4–10.8)

## 2021-08-11 ENCOUNTER — Ambulatory Visit: Payer: Medicare HMO | Admitting: Podiatry

## 2021-08-11 ENCOUNTER — Other Ambulatory Visit: Payer: Self-pay

## 2021-08-11 ENCOUNTER — Encounter: Payer: Self-pay | Admitting: Podiatry

## 2021-08-11 DIAGNOSIS — G609 Hereditary and idiopathic neuropathy, unspecified: Secondary | ICD-10-CM

## 2021-08-11 DIAGNOSIS — M79675 Pain in left toe(s): Secondary | ICD-10-CM | POA: Diagnosis not present

## 2021-08-11 DIAGNOSIS — M79674 Pain in right toe(s): Secondary | ICD-10-CM

## 2021-08-11 DIAGNOSIS — B351 Tinea unguium: Secondary | ICD-10-CM

## 2021-08-11 DIAGNOSIS — Q828 Other specified congenital malformations of skin: Secondary | ICD-10-CM

## 2021-08-16 NOTE — Progress Notes (Signed)
  Subjective:  Patient ID: [name] [name], female    DOB: 06/04/1941,  MRN: [id number]  80 y.o. female presents at risk foot care with history of peripheral neuropathy and painful porokeratotic lesion(s) of right foot and painful mycotic toenails that limit ambulation. Painful toenails interfere with ambulation. Aggravating factors include wearing enclosed shoe gear. Pain is relieved with periodic professional debridement. Painful porokeratotic lesions are aggravated when weightbearing with and without shoegear. Pain is relieved with periodic professional debridement.  Her daughter is present during today's visit. They voice no new pedal problems on today's visit.  PCP is [name], DO , and last visit was 01/31/2021.  Allergies  Allergen Reactions   Buprenorphine Hcl Itching   Cymbalta [Duloxetine Hcl] Other (See Comments)    Other reaction(s): Other (See Comments) Personality changes - crying  2014   Morphine And Related Itching   Oxycodone-Acetaminophen Other (See Comments)    Personality changes    Valium [Diazepam] Other (See Comments)    Personality changes - "in another world" ;  Hallucinations  2015   Statins Other (See Comments)    Other reaction(s): Other (See Comments) Muscle weakness Muscle weakness   Altace [Ramipril] Other (See Comments)    unknown   Floxin [Ofloxacin] Other (See Comments)    unknown   Hydromorphone Other (See Comments)    Other reaction(s): Confusion (intolerance) unknown   Lovaza [Omega-3-Acid Ethyl Esters] Other (See Comments)    Muscle weakness    Trilipix [Choline Fenofibrate] Other (See Comments)    Muscle weakness    Zetia [Ezetimibe] Other (See Comments)    Muscle weakness     Review of Systems: Negative except as noted in the HPI.   Objective:  Vascular Examination: Vascular status intact b/l with palpable DP pulses. PT pulses faintly palpable b/l. CFT immediate b/l. No edema. No pain with calf compression b/l. Skin  temperature gradient WNL b/l.   Neurological Examination: Sensation grossly intact b/l with 10 gram monofilament. Vibratory sensation intact b/l. Pt has subjective symptoms of neuropathy.  Dermatological Examination: Pedal skin with normal turgor, texture and tone b/l. Toenails 1-5 b/l thick, discolored, elongated with subungual debris and pain on dorsal palpation. No hyperkeratotic lesions noted b/l. Porokeratotic lesion(s) submet head 5 right foot. No erythema, no edema, no drainage, no fluctuance.  Musculoskeletal Examination: Muscle strength 5/5 to b/l LE. Normal muscle strength 5/5 to all lower extremity muscle groups bilaterally. Hammertoe(s) noted to the R 2nd toe.  Radiographs: None Assessment:   1. Pain due to onychomycosis of toenails of both feet   2. Porokeratosis   3. Idiopathic peripheral neuropathy     Plan:  -Examined patient. -Patient to continue soft, supportive shoe gear daily. -Toenails 1-5 b/l were debrided in length and girth with sterile nail nippers and dremel without iatrogenic bleeding.  -Painful porokeratotic lesion(s) submet head 5 right foot pared and enucleated with sterile scalpel blade without incident. Total number of lesions debrided=1. -Patient to report any pedal injuries to medical professional immediately. -Patient/POA to call should there be question/concern in the interim.  Return in about 9 weeks (around 10/13/2021).  Marzetta Board, DPM

## 2021-08-28 DIAGNOSIS — M19041 Primary osteoarthritis, right hand: Secondary | ICD-10-CM | POA: Insufficient documentation

## 2021-08-29 ENCOUNTER — Telehealth: Payer: Self-pay | Admitting: Hematology

## 2021-08-29 ENCOUNTER — Telehealth: Payer: Self-pay

## 2021-08-29 NOTE — Telephone Encounter (Signed)
Daughter spoke with this nurse states she has some concerns noted pt Hgb has slowly declined over last several months would like a appointment with Dr. Burr Medico to discuss scheduling message sent for lab and provider visit this nurse also requested most recent labs from [facility] be sent to [facility] for provider review awaiting results

## 2021-08-29 NOTE — Telephone Encounter (Signed)
SCHEDULED PER [place] [facility]. CALLED AND LEFT [facility]

## 2021-09-03 DIAGNOSIS — Z0289 Encounter for other administrative examinations: Secondary | ICD-10-CM

## 2021-09-07 ENCOUNTER — Other Ambulatory Visit: Payer: Self-pay | Admitting: Family Medicine

## 2021-09-08 ENCOUNTER — Other Ambulatory Visit: Payer: Self-pay | Admitting: *Deleted

## 2021-09-08 MED ORDER — DILTIAZEM HCL ER BEADS 120 MG PO CP24: ORAL_CAPSULE | ORAL | 0 refills | Status: DC

## 2021-09-08 NOTE — Telephone Encounter (Signed)
Will send in 1 mth but refills to come from her cardiologist since they have been prescribing.

## 2021-09-12 ENCOUNTER — Ambulatory Visit: Payer: Medicare HMO | Admitting: Hematology

## 2021-09-12 ENCOUNTER — Other Ambulatory Visit: Payer: Medicare HMO

## 2021-09-16 ENCOUNTER — Encounter: Payer: Self-pay | Admitting: Family Medicine

## 2021-09-16 ENCOUNTER — Ambulatory Visit (INDEPENDENT_AMBULATORY_CARE_PROVIDER_SITE_OTHER): Payer: Medicare HMO | Admitting: Family Medicine

## 2021-09-16 DIAGNOSIS — U071 COVID-19: Secondary | ICD-10-CM | POA: Diagnosis not present

## 2021-09-16 MED ORDER — GUAIFENESIN ER 600 MG PO TB12: 600.0000 mg | ORAL_TABLET | Freq: Two times a day (BID) | ORAL | 0 refills | Status: AC

## 2021-09-16 MED ORDER — MOLNUPIRAVIR EUA 200MG CAPSULE: 4.0000 | ORAL_CAPSULE | Freq: Two times a day (BID) | ORAL | 0 refills | Status: AC

## 2021-09-16 NOTE — Telephone Encounter (Signed)
Please offer her a Virtual/ in person visit.  She should be on the covid antiviral given her age.  May need to consider in person evaluation for dehydration/ CXR

## 2021-09-16 NOTE — Progress Notes (Signed)
Virtual Visit via telephone Note Due to COVID-19 pandemic this visit was conducted virtually. This visit type was conducted due to national recommendations for restrictions regarding the COVID-19 Pandemic (e.g. social distancing, sheltering in place) in an effort to limit this patient's exposure and mitigate transmission in our community. All issues noted in this document were discussed and addressed.  A physical exam was not performed with this format.   I connected with [name] [name] on 09/16/2021 at 1120 by telephone and verified that I am speaking with the correct person using two identifiers. [name] [name] is currently located at home and  no one  is currently with them during visit. The provider, Monia Pouch, FNP is located in their office at time of visit.  I discussed the limitations, risks, security and privacy concerns of performing an evaluation and management service by telephone and the availability of in person appointments. I also discussed with the patient that there may be a patient responsible charge related to this service. The patient expressed understanding and agreed to proceed.  Subjective:  Patient ID: [name] [name], female    DOB: 07/01/1941, 80 y.o.   MRN: [id number]  Chief Complaint:  Covid Positive   HPI: [name] [name] is a 80 y.o. female presenting on 09/16/2021 for Covid Positive   Pt reports she tested positive for COVID-19. She has cough, congestion, fever, chills, and malaise. She has been coughing up sputum. She denies shortness of breath. She is eating and drinking well and voiding several times per day. She has been taking Tylenol with some relief of symptoms. She did have headache and dizziness initially but reports this has improved.     Relevant past medical, surgical, family, and social history reviewed and updated as indicated.  Allergies and medications reviewed and updated.   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 Abnl nuc 11/2014. Cath 12/2014 - turned down for CABG. Ultimately s/p TTVP, rotational atherectomy, PTCA and stenting of the ostial LCx and left main into the LAD (crush technique), and IVUS of the LAD/Left main. // b. Myoview 11/17: EF 48, poor quality/significant artifact; inf-lateral, inferior ischemia; Intermediate Risk   Chronic atrial fibrillation ([place])    a.  First noted post-op 9/15 spinal fusion.  She had cardioversion, not on anticoagulation. Fall risk, unsteady. // failed DCCV // Holter 10/17: AFib, Avg HR 97, PVCs, no other arrhythmia   Chronic back pain greater than 3 months duration    a. spinal stenosis.  Spinal fusion with rods in 2/15 at [facility] spinal fusion 9/15.   Chronic diastolic CHF    Echo 9/39:  EF 50-55%, trivial AI, midl MR, mod LAE, PASP 37 mmHg // Echocardiogram 03/2020: EF 45-50, no RWMA, mild LVH, mild reduced RVSF, mildly elevated PASP (RVSP 38.3), mild LAE, mild MR, mild to mod TR, trivial AI // Echo 9/21: EF 45, mild LVH, mildly reduced RV SF, moderate LAE, mild RAE, mild MR, mild AI      Circadian rhythm sleep disorder    CTS (carpal tunnel syndrome)    Deficiency anemia 11/10/2014   Depression    Diarrhea    Diverticulitis of colon    Esophageal stricture  Gastritis    Gastroesophageal hernia 07/06/2013   GERD (gastroesophageal reflux disease)    Hiatal hernia    History of blood transfusion    "most of them related to OR's"    HTN (hypertension)    Hyperlipidemia    IBS (irritable bowel syndrome)    Incontinence 10/13/2012   Insomnia    Ischemic chest pain ([place])    Memory disorder 62/83/6629   Metabolic syndrome 03/27/6545   MGUS (monoclonal gammopathy of unknown significance) dx'd 11/2014   a. Neg BMB 11/2014.   Multiple  falls    Obesity    Obstructive sleep apnea    "have mask; don't wear it" (01/07/2015)   Orthostasis    PAT (paroxysmal atrial tachycardia) ([place])    Personal history of colonic polyps 10/25/2011 & 12/02/11   not retrieved Dr Lyla Son & tubular adenomas   Pneumonia 03/2014   Sinus bradycardia    a. Baseline HR 50s-60s.   Stroke [facility]) early 2000's   "small"; denies residual on 01/07/2015)    Past Surgical History:  Procedure Laterality Date   BACK SURGERY     BONE MARROW BIOPSY  11/2014   CARDIAC CATHETERIZATION  12/27/2014   Procedure: INTRAVASCULAR PRESSURE WIRE/FFR STUDY;  Surgeon: Larey Dresser, MD;  Location: [facility] CATH LAB;  Service: Cardiovascular;;   CARDIAC CATHETERIZATION N/A 11/25/2016   Procedure: Left Heart Cath and Coronary Angiography;  Surgeon: Sherren Mocha, MD;  Location: [facility] CV LAB;  Service: Cardiovascular;  Laterality: N/A;   CARDIOVERSION N/A 02/13/2016   Procedure: CARDIOVERSION;  Surgeon: Josue Hector, MD;  Location: [facility];  Service: Cardiovascular;  Laterality: N/A;   CARPAL TUNNEL RELEASE Right 1980's   CATARACT EXTRACTION Bilateral    CATARACT EXTRACTION W/ INTRAOCULAR LENS  IMPLANT, BILATERAL  2000's   CORONARY ANGIOPLASTY WITH STENT PLACEMENT  01/07/2015   "2"   CORONARY STENT PLACEMENT     DILATION AND CURETTAGE OF UTERUS     ESOPHAGOGASTRODUODENOSCOPY (EGD) WITH ESOPHAGEAL DILATION  "several times"   JOINT REPLACEMENT     KNEE ARTHROSCOPY Left 1995   LAPAROSCOPIC CHOLECYSTECTOMY  2003   LEFT HEART CATHETERIZATION WITH CORONARY ANGIOGRAM N/A 12/27/2014   Procedure: LEFT HEART CATHETERIZATION WITH CORONARY ANGIOGRAM;  Surgeon: Larey Dresser, MD;  Location: [facility] CATH LAB;  Service: Cardiovascular;  Laterality: N/A;   PERCUTANEOUS CORONARY ROTOBLATOR INTERVENTION (PCI-R)  01/07/2015   PERCUTANEOUS CORONARY ROTOBLATOR INTERVENTION (PCI-R) N/A 01/07/2015   Procedure: PERCUTANEOUS CORONARY ROTOBLATOR INTERVENTION (PCI-R);  Surgeon: Blane Ohara, MD;  Location: [facility] CATH LAB;  Service: Cardiovascular;  Laterality: N/A;   POSTERIOR FUSION THORACIC SPINE  08/2015   POSTERIOR LUMBAR FUSION  01/2015   TOTAL KNEE ARTHROPLASTY Bilateral 1990's - 2000's    Social History   Socioeconomic History   Marital status: Widowed    Spouse name: Not on file   Number of children: 2   Years of education: HS   Highest education level: Not on file  Occupational History   Occupation: Licensed conveyancer- retired    Comment: retired  Tobacco Use   Smoking status: Never   Smokeless tobacco: Never  Vaping Use   Vaping Use: Never used  Substance and Sexual Activity   Alcohol use: No    Comment: 01/07/2015 "glass of wine at Christmas, maybe"   Drug use: No   Sexual activity: Never  Other Topics Concern   Not on file  Social History Narrative   Patient is right handed   Patient drinks 1-2  sodas daily.   patlient lives alone.   Social Determinants of Health   Financial Resource Strain: Not on file  Food Insecurity: Not on file  Transportation Needs: Not on file  Physical Activity: Not on file  Stress: Not on file  Social Connections: Not on file  Intimate Partner Violence: Not on file    Outpatient Encounter Medications as of 09/16/2021  Medication Sig   guaiFENesin (MUCINEX) 600 MG 12 hr tablet Take 1 tablet (600 mg total) by mouth 2 (two) times daily for 10 days.   molnupiravir EUA (LAGEVRIO) 200 mg CAPS capsule Take 4 capsules (800 mg total) by mouth 2 (two) times daily for 5 days.   albuterol (VENTOLIN HFA) 108 (90 Base) MCG/ACT inhaler Inhale 2 puffs into the lungs every 6 (six) hours as needed for wheezing or shortness of breath.   aspirin EC 81 MG tablet Take 1 tablet (81 mg total) by mouth daily.   Cholecalciferol (VITAMIN D) 2000 units tablet Take 2,000 Units by mouth daily.   clobetasol cream (TEMOVATE) 1.19 % Apply 1 application topically See admin instructions. Applies to vaginal area twice a week.   diltiazem (TIAZAC) 120 MG 24  hr capsule TAKE 1 CAPSULE (120 MG TOTAL) BY MOUTH IN THE MORNING AND AT BEDTIME. Please send refill request to Cardiologist.   diphenoxylate-atropine (LOMOTIL) 2.5-0.025 MG tablet Take 1-2 tablets orally up to 4x per day   ELIQUIS 5 MG TABS tablet Take 1 tablet (5 mg total) by mouth 2 (two) times daily.   Eszopiclone 3 MG TABS Take 1 tablet (3 mg total) by mouth at bedtime. For insomnia,take immediately before bedtime.   fentaNYL (DURAGESIC - DOSED MCG/HR) 50 MCG/HR Place 1 patch (50 mcg total) onto the skin every 3 (three) days.   FLUoxetine (PROZAC) 40 MG capsule Take 1 capsule (40 mg total) by mouth daily. (NEEDS TO BE SEEN BEFORE NEXT REFILL)   furosemide (LASIX) 80 MG tablet Take 1 tablet (80 mg total) by mouth daily.   HYDROcodone-acetaminophen (NORCO) 10-325 MG per tablet Take 1 tablet by mouth every 4 (four) hours as needed for moderate pain.    LINZESS 145 MCG CAPS capsule Take 145 mcg by mouth as needed (constipation).   Multiple Vitamin (MULTIVITAMIN) tablet Take 1 tablet by mouth daily. For supplement   nitroGLYCERIN (NITROSTAT) 0.4 MG SL tablet Place 1 tablet (0.4 mg total) under the tongue every 5 (five) minutes as needed for chest pain (up to 3 doses).   NONFORMULARY OR COMPOUNDED ITEM Apply 1 application topically daily. Antifungal solution: Terbinafine 3%, Fluconazole 2%, Tea Tree Oil 5%, Urea 10%, Ibuprofen 2% in DMSO suspension #74m   pantoprazole (PROTONIX) 40 MG tablet Take 1 tablet (40 mg total) by mouth 2 (two) times daily.   potassium chloride (KLOR-CON) 10 MEQ tablet Take 10 mEq by mouth daily.   rosuvastatin (CRESTOR) 10 MG tablet Patient takes one tablet by mouth (10 mg) tablet alternating every other day with one half tablet by mouth (5 mg)tablet.   triamcinolone cream (KENALOG) 0.1 % Apply 1 application topically 3 (three) times daily.    No facility-administered encounter medications on file as of 09/16/2021.    Allergies  Allergen Reactions   Buprenorphine Hcl  Itching   Cymbalta [Duloxetine Hcl] Other (See Comments)    Other reaction(s): Other (See Comments) Personality changes - crying  2014   Morphine And Related Itching   Oxycodone-Acetaminophen Other (See Comments)    Personality changes    Valium [Diazepam] Other (  See Comments)    Personality changes - "in another world" ;  Hallucinations  2015   Statins Other (See Comments)    Other reaction(s): Other (See Comments) Muscle weakness Muscle weakness   Altace [Ramipril] Other (See Comments)    unknown   Floxin [Ofloxacin] Other (See Comments)    unknown   Hydromorphone Other (See Comments)    Other reaction(s): Confusion (intolerance) unknown   Lovaza [Omega-3-Acid Ethyl Esters] Other (See Comments)    Muscle weakness    Trilipix [Choline Fenofibrate] Other (See Comments)    Muscle weakness    Zetia [Ezetimibe] Other (See Comments)    Muscle weakness     Review of Systems  Constitutional:  Positive for activity change, appetite change, chills, fatigue and fever. Negative for diaphoresis and unexpected weight change.  HENT:  Positive for congestion and rhinorrhea. Negative for dental problem, drooling, ear discharge, ear pain, facial swelling, hearing loss, mouth sores, nosebleeds, sinus pressure, sinus pain, sneezing, sore throat, tinnitus, trouble swallowing and voice change.   Eyes: Negative.   Respiratory:  Positive for cough. Negative for apnea, choking, chest tightness, shortness of breath, wheezing and stridor.   Cardiovascular:  Negative for chest pain, palpitations and leg swelling.  Gastrointestinal:  Negative for blood in stool, constipation, diarrhea, nausea and vomiting.  Endocrine: Negative.   Genitourinary:  Negative for decreased urine volume, difficulty urinating, dysuria, frequency and urgency.  Musculoskeletal:  Negative for arthralgias and myalgias.  Skin: Negative.   Allergic/Immunologic: Negative.   Neurological:  Positive for headaches. Negative for  dizziness, tremors, seizures, syncope, facial asymmetry, speech difficulty, weakness, light-headedness and numbness.  Hematological: Negative.   Psychiatric/Behavioral:  Negative for confusion, hallucinations, sleep disturbance and suicidal ideas.   All other systems reviewed and are negative.       Observations/Objective: No vital signs or physical exam, this was a telephone or virtual health encounter.  Pt alert and oriented, answers all questions appropriately, and able to speak in full sentences.    Assessment and Plan: Nalaysia was seen today for covid positive.  Diagnoses and all orders for this visit:  COVID-19 virus infection Will initiate antiviral therapy due to age and comorbidities. Mucinex as prescribed. Adequate hydration discussed in detail. Pt aware to report any new, worsening, or persistent symptoms. Symptomatic care discussed in detail.  -     molnupiravir EUA (LAGEVRIO) 200 mg CAPS capsule; Take 4 capsules (800 mg total) by mouth 2 (two) times daily for 5 days. -     guaiFENesin (MUCINEX) 600 MG 12 hr tablet; Take 1 tablet (600 mg total) by mouth 2 (two) times daily for 10 days.     Follow Up Instructions: Return if symptoms worsen or fail to improve.    I discussed the assessment and treatment plan with the patient. The patient was provided an opportunity to ask questions and all were answered. The patient agreed with the plan and demonstrated an understanding of the instructions.   The patient was advised to call back or seek an in-person evaluation if the symptoms worsen or if the condition fails to improve as anticipated.  The above assessment and management plan was discussed with the patient. The patient verbalized understanding of and has agreed to the management plan. Patient is aware to call the clinic if they develop any new symptoms or if symptoms persist or worsen. Patient is aware when to return to the clinic for a follow-up visit. Patient educated  on when it is appropriate to go to  the emergency department.    I provided 15 minutes of non-face-to-face time during this encounter. The call started at 1120. The call ended at 1135. The other time was used for coordination of care.    Monia Pouch, FNP-C [facility] [address] [place], [place] [address] [phone] 09/16/2021

## 2021-09-22 ENCOUNTER — Inpatient Hospital Stay: Payer: Medicare HMO

## 2021-09-22 ENCOUNTER — Inpatient Hospital Stay: Payer: Medicare HMO | Admitting: Hematology

## 2021-10-07 ENCOUNTER — Other Ambulatory Visit: Payer: Self-pay | Admitting: Family Medicine

## 2021-10-08 ENCOUNTER — Other Ambulatory Visit: Payer: Self-pay | Admitting: *Deleted

## 2021-10-08 NOTE — Telephone Encounter (Signed)
[facility]. NTBS for 4 mos (Oct) ckup. Mail order not sent

## 2021-10-10 ENCOUNTER — Telehealth: Payer: Self-pay | Admitting: Hematology

## 2021-10-10 NOTE — Telephone Encounter (Signed)
Rescheduled pe 10/21 pt request, pts daughter approved new appt time

## 2021-10-12 ENCOUNTER — Other Ambulatory Visit: Payer: Self-pay | Admitting: Hematology

## 2021-10-12 DIAGNOSIS — D509 Iron deficiency anemia, unspecified: Secondary | ICD-10-CM

## 2021-10-13 ENCOUNTER — Other Ambulatory Visit: Payer: Self-pay

## 2021-10-13 ENCOUNTER — Inpatient Hospital Stay (HOSPITAL_BASED_OUTPATIENT_CLINIC_OR_DEPARTMENT_OTHER): Payer: Medicare HMO | Admitting: Hematology

## 2021-10-13 ENCOUNTER — Inpatient Hospital Stay: Payer: Medicare HMO

## 2021-10-13 ENCOUNTER — Encounter: Payer: Self-pay | Admitting: Hematology

## 2021-10-13 ENCOUNTER — Inpatient Hospital Stay: Payer: Medicare HMO | Attending: Hematology

## 2021-10-13 VITALS — BP 120/78 | HR 83 | Temp 98.2°F | Resp 18 | Ht 66.0 in | Wt 179.1 lb

## 2021-10-13 DIAGNOSIS — I482 Chronic atrial fibrillation, unspecified: Secondary | ICD-10-CM | POA: Diagnosis not present

## 2021-10-13 DIAGNOSIS — Z888 Allergy status to other drugs, medicaments and biological substances status: Secondary | ICD-10-CM | POA: Diagnosis not present

## 2021-10-13 DIAGNOSIS — Z885 Allergy status to narcotic agent status: Secondary | ICD-10-CM | POA: Diagnosis not present

## 2021-10-13 DIAGNOSIS — I2510 Atherosclerotic heart disease of native coronary artery without angina pectoris: Secondary | ICD-10-CM | POA: Insufficient documentation

## 2021-10-13 DIAGNOSIS — F0393 Unspecified dementia, unspecified severity, with mood disturbance: Secondary | ICD-10-CM | POA: Insufficient documentation

## 2021-10-13 DIAGNOSIS — E611 Iron deficiency: Secondary | ICD-10-CM | POA: Diagnosis not present

## 2021-10-13 DIAGNOSIS — Z23 Encounter for immunization: Secondary | ICD-10-CM

## 2021-10-13 DIAGNOSIS — R5383 Other fatigue: Secondary | ICD-10-CM | POA: Insufficient documentation

## 2021-10-13 DIAGNOSIS — Z8673 Personal history of transient ischemic attack (TIA), and cerebral infarction without residual deficits: Secondary | ICD-10-CM | POA: Insufficient documentation

## 2021-10-13 DIAGNOSIS — D649 Anemia, unspecified: Secondary | ICD-10-CM | POA: Diagnosis not present

## 2021-10-13 DIAGNOSIS — D500 Iron deficiency anemia secondary to blood loss (chronic): Secondary | ICD-10-CM | POA: Diagnosis not present

## 2021-10-13 DIAGNOSIS — Z9049 Acquired absence of other specified parts of digestive tract: Secondary | ICD-10-CM | POA: Insufficient documentation

## 2021-10-13 DIAGNOSIS — Z881 Allergy status to other antibiotic agents status: Secondary | ICD-10-CM | POA: Diagnosis not present

## 2021-10-13 DIAGNOSIS — Z7901 Long term (current) use of anticoagulants: Secondary | ICD-10-CM | POA: Insufficient documentation

## 2021-10-13 DIAGNOSIS — K580 Irritable bowel syndrome with diarrhea: Secondary | ICD-10-CM | POA: Insufficient documentation

## 2021-10-13 DIAGNOSIS — I110 Hypertensive heart disease with heart failure: Secondary | ICD-10-CM | POA: Diagnosis not present

## 2021-10-13 DIAGNOSIS — M1990 Unspecified osteoarthritis, unspecified site: Secondary | ICD-10-CM | POA: Insufficient documentation

## 2021-10-13 DIAGNOSIS — R058 Other specified cough: Secondary | ICD-10-CM | POA: Diagnosis not present

## 2021-10-13 DIAGNOSIS — E785 Hyperlipidemia, unspecified: Secondary | ICD-10-CM | POA: Insufficient documentation

## 2021-10-13 DIAGNOSIS — R509 Fever, unspecified: Secondary | ICD-10-CM | POA: Insufficient documentation

## 2021-10-13 DIAGNOSIS — D472 Monoclonal gammopathy: Secondary | ICD-10-CM | POA: Insufficient documentation

## 2021-10-13 DIAGNOSIS — F0394 Unspecified dementia, unspecified severity, with anxiety: Secondary | ICD-10-CM | POA: Diagnosis not present

## 2021-10-13 DIAGNOSIS — Z8719 Personal history of other diseases of the digestive system: Secondary | ICD-10-CM | POA: Insufficient documentation

## 2021-10-13 DIAGNOSIS — R531 Weakness: Secondary | ICD-10-CM | POA: Insufficient documentation

## 2021-10-13 DIAGNOSIS — D509 Iron deficiency anemia, unspecified: Secondary | ICD-10-CM

## 2021-10-13 LAB — CMP (CANCER CENTER ONLY)
ALT: 21 U/L (ref 0–44)
AST: 24 U/L (ref 15–41)
Albumin: 3.7 g/dL (ref 3.5–5.0)
Alkaline Phosphatase: 110 U/L (ref 38–126)
Anion gap: 6 (ref 5–15)
BUN: 12 mg/dL (ref 8–23)
CO2: 31 mmol/L (ref 22–32)
Calcium: 8.6 mg/dL — ABNORMAL LOW (ref 8.9–10.3)
Chloride: 101 mmol/L (ref 98–111)
Creatinine: 0.72 mg/dL (ref 0.44–1.00)
GFR, Estimated: >60 mL/min (ref >60)
Glucose, Bld: 129 mg/dL — ABNORMAL HIGH (ref 70–99)
Potassium: 3.9 mmol/L (ref 3.5–5.1)
Sodium: 138 mmol/L (ref 135–145)
Total Bilirubin: 0.7 mg/dL (ref 0.3–1.2)
Total Protein: 6.5 g/dL (ref 6.5–8.1)

## 2021-10-13 LAB — CBC WITH DIFFERENTIAL (CANCER CENTER ONLY)
Abs Immature Granulocytes: 0.02 10*3/uL (ref 0.00–0.07)
Basophils Absolute: 0 10*3/uL (ref 0.0–0.1)
Basophils Relative: 0 %
Eosinophils Absolute: 0.1 10*3/uL (ref 0.0–0.5)
Eosinophils Relative: 2 %
HCT: 33.1 % — ABNORMAL LOW (ref 36.0–46.0)
Hemoglobin: 10.5 g/dL — ABNORMAL LOW (ref 12.0–15.0)
Immature Granulocytes: 0 %
Lymphocytes Relative: 31 %
Lymphs Abs: 1.4 10*3/uL (ref 0.7–4.0)
MCH: 27.7 pg (ref 26.0–34.0)
MCHC: 31.7 g/dL (ref 30.0–36.0)
MCV: 87.3 fL (ref 80.0–100.0)
Monocytes Absolute: 0.4 10*3/uL (ref 0.1–1.0)
Monocytes Relative: 8 %
Neutro Abs: 2.7 10*3/uL (ref 1.7–7.7)
Neutrophils Relative %: 59 %
Platelet Count: 211 10*3/uL (ref 150–400)
RBC: 3.79 MIL/uL — ABNORMAL LOW (ref 3.87–5.11)
RDW: 17.2 % — ABNORMAL HIGH (ref 11.5–15.5)
WBC Count: 4.6 10*3/uL (ref 4.0–10.5)
nRBC: 0 % (ref 0.0–0.2)

## 2021-10-13 LAB — RETIC PANEL
Immature Retic Fract: 12.6 % (ref 2.3–15.9)
RBC.: 3.83 MIL/uL — ABNORMAL LOW (ref 3.87–5.11)
Retic Count, Absolute: 61.3 10*3/uL (ref 19.0–186.0)
Retic Ct Pct: 1.6 % (ref 0.4–3.1)
Reticulocyte Hemoglobin: 28.6 pg (ref >27.9)

## 2021-10-13 LAB — IRON AND TIBC
Iron: 35 ug/dL (ref 28–170)
Saturation Ratios: 10 % — ABNORMAL LOW (ref 10.4–31.8)
TIBC: 347 ug/dL (ref 250–450)
UIBC: 312 ug/dL

## 2021-10-13 LAB — FERRITIN: Ferritin: 3 ng/mL — ABNORMAL LOW (ref 11–307)

## 2021-10-13 MED ORDER — INFLUENZA VAC A&B SA ADJ QUAD 0.5 ML IM PRSY
0.5000 mL | PREFILLED_SYRINGE | Freq: Once | INTRAMUSCULAR | Status: AC
  Administered 2021-10-13: 0.5 mL via INTRAMUSCULAR
  Filled 2021-10-13: qty 0.5

## 2021-10-13 NOTE — Patient Instructions (Signed)
Influenza (Flu) Vaccine (Inactivated or Recombinant): What You Need to Know 1. Why get vaccinated? Influenza vaccine can prevent influenza (flu). Flu is a contagious disease that spreads around the [place] every year, usually between October and May. Anyone can get the flu, but it is more dangerous for some people. Infants and young children, people 65 years and older, pregnant people, and people with certain health conditions or a weakened immune system are at greatest risk of flu complications. Pneumonia, bronchitis, sinus infections, and ear infections are examples of flu-related complications. If you have a medical condition, such as heart disease, cancer, or diabetes, flu can make it worse. Flu can cause fever and chills, sore throat, muscle aches, fatigue, cough, headache, and runny or stuffy nose. Some people may have vomiting and diarrhea, though this is more common in children than adults. In an average year, thousands of people in the [place] die from flu, and many more are hospitalized. Flu vaccine prevents millions of illnesses and flu-related visits to the doctor each year. 2. Influenza vaccines CDC recommends everyone 6 months and older get vaccinated every flu season. Children 6 months through 8 years of age may need 2 doses during a single flu season. Everyone else needs only 1 dose each flu season. It takes about 2 weeks for protection to develop after vaccination. There are many flu viruses, and they are always changing. Each year a new flu vaccine is made to protect against the influenza viruses believed to be likely to cause disease in the upcoming flu season. Even when the vaccine doesn't exactly match these viruses, it may still provide some protection. Influenza vaccine does not cause flu. Influenza vaccine may be given at the same time as other vaccines. 3. Talk with your health care provider Tell your vaccination provider if the person getting the vaccine: Has had  an allergic reaction after a previous dose of influenza vaccine, or has any severe, life-threatening allergies Has ever had Guillain-Barr Syndrome (also called "GBS") In some cases, your health care provider may decide to postpone influenza vaccination until a future visit. Influenza vaccine can be administered at any time during pregnancy. People who are or will be pregnant during influenza season should receive inactivated influenza vaccine. People with minor illnesses, such as a cold, may be vaccinated. People who are moderately or severely ill should usually wait until they recover before getting influenza vaccine. Your health care provider can give you more information. 4. Risks of a vaccine reaction Soreness, redness, and swelling where the shot is given, fever, muscle aches, and headache can happen after influenza vaccination. There may be a very small increased risk of Guillain-Barr Syndrome (GBS) after inactivated influenza vaccine (the flu shot). Young children who get the flu shot along with pneumococcal vaccine (PCV13) and/or DTaP vaccine at the same time might be slightly more likely to have a seizure caused by fever. Tell your health care provider if a child who is getting flu vaccine has ever had a seizure. People sometimes faint after medical procedures, including vaccination. Tell your provider if you feel dizzy or have vision changes or ringing in the ears. As with any medicine, there is a very remote chance of a vaccine causing a severe allergic reaction, other serious injury, or death. 5. What if there is a serious problem? An allergic reaction could occur after the vaccinated person leaves the clinic. If you see signs of a severe allergic reaction (hives, swelling of the face and throat, difficulty breathing,   a fast heartbeat, dizziness, or weakness), call 9-1-1 and get the person to the nearest hospital. For other signs that concern you, call your health care provider. Adverse  reactions should be reported to the Vaccine Adverse Event Reporting System (VAERS). Your health care provider will usually file this report, or you can do it yourself. Visit the VAERS website at www.vaers.hhs.gov or call 1-800-822-7967. VAERS is only for reporting reactions, and VAERS staff members do not give medical advice. 6. The National Vaccine Injury Compensation Program The National Vaccine Injury Compensation Program (VICP) is a federal program that was created to compensate people who may have been injured by certain vaccines. Claims regarding alleged injury or death due to vaccination have a time limit for filing, which may be as short as two years. Visit the VICP website at www.hrsa.gov/vaccinecompensation or call 1-800-338-2382 to learn about the program and about filing a claim. 7. How can I learn more? Ask your health care provider. Call your local or state health department. Visit the website of the Food and Drug Administration (FDA) for vaccine package inserts and additional information at www.fda.gov/vaccines-blood-biologics/vaccines. Contact the Centers for Disease Control and Prevention (CDC): Call 1-800-232-4636 (1-800-CDC-INFO) or Visit CDC's website at www.cdc.gov/flu. Vaccine Information Statement Inactivated Influenza Vaccine (07/26/2020) This information is not intended to replace advice given to you by your health care provider. Make sure you discuss any questions you have with your health care provider. Document Revised: 09/12/2020 Document Reviewed: 09/12/2020 Elsevier Patient Education  2022 [employer]  

## 2021-10-13 NOTE — Progress Notes (Addendum)
[name] [name]   [phone] [phone]   Clinic Follow up Note   Patient Care Team: [name], DO as PCP - General (Family Medicine) Sherren Mocha, MD as PCP - Cardiology (Cardiology) Vickie Epley, MD as PCP - Electrophysiology (Cardiology) Suella Broad, MD (Physical Medicine and Rehabilitation) Hillary Bow, MD (Cardiology) Newton Pigg, MD (Obstetrics and Gynecology) Sharmon Revere as Physician Assistant (Cardiology)  Date of Service:  10/13/2021  CHIEF COMPLAINT: f/u of anemia and MGUS  CURRENT THERAPY:  Observation  ASSESSMENT & PLAN:  [name] [name] is a 80 y.o. female with   1. Anemia of iron deficiency -I previously reviewed her lab result with her and  Her daughter. No lab evidence of megaloblastic anemia, or hemolysis.   -She has normal iron level and ferritin, but low iron saturation and elevated soluble transferrin receptor, and history of very low ferritin in the past (<10), which support IDA. Her bone marrow biopsy also showed low iron staining. -her anemia resolved, and I released her from f/u in 11/2019. -her daughter reached out to [place] with concern regarding recent decrease in patient's hgb. Colonoscopy in 02/2021 was negative for bleeding. -Lab reviewed, hemoglobin 10.5, leukocyte inappropriate 28.6pg, which does not support iron deficiency, iron study results still pending. -I discussed that given her age and the fact that the anemia remains mild, I feel comfortable just monitoring this. I asked her to complete labs with her PCP on her next visit in 2 months as well to monitor.  -I will contact her with her the results from the remainder of today's labs.   2. MGUS, IgG  -SPEP in 2016 showed low level M-protein (0.3), immunofixation was consistent with IgG lambda protein.  Quantitative immunoglobulin level and light chain levels were grossly normal. -Her bone marrow biopsy results, which showed 5%  plasma cell, no monoclonal light chain deposition. -She has MGUS. No evidence of multiple myeloma. -Her M-spike has been in range of 0-0.2, very low level. Today's panel is still pending.  -Her risk of MGUS evolving to multiple myeloma is very low   3. Dementia, anxiety -She could not tolerate Aricept. Follow up with neurologist. -Stable memory. She may confuse conversation topics or difficulty finding the word she wants.    4. HTN, AF, dyslipidemia  -follow up with PCP and cardiologist   5. Fatigue, Diffuse joint pain/arthritis  -She is s/p 2 back surgeries.  -Her diffuse joint pain has worsened  -She is currently on Fentanyl patch and alternating hydrocodone and muscle relaxer's. This is managed by her PCP Dr Warrick Parisian.  -She is not ambulating due to pain exacerbation. She remains sedentary because she has minimal or no pain when sitting.  -She ambulates with walker everywhere due to pain and overall weakness. She lives alone but has help as needed.   6. Covid-19+ 09/16/21 -she experienced productive cough, congestion, fever, chills, malaise, and taste and smell loss/change. -she was prescribed lagevrio by her PCP -she has mostly recovered, except for taste and smell changes.   7. Depression -She is on Prozac by her PCP     Plan -I will reach out with her lab results when they return. -repeat CBC and iron study with her PCP in 2 months  -lab and f/u in 5 months  Addendum Her iron study showed iron deficiency. Pt could not tolerate oral iron well, will schedule iv Venofer 414m [id number], I recommend her to repeat CBC and ferritin when she sees  her PCP in late Dec 2022.will copy Dr. Lajuana Ripple.   Truitt Merle  10/15/2021   No problem-specific Assessment & Plan notes found for this encounter.   INTERVAL HISTORY:  [name] [name] is here for a follow up of anemia and MGUS. She was last seen by me on 11/24/19. She presents to the clinic accompanied by her daughter. She reports she  is very tired. She reports her bowel movements fluctuate between constipation and diarrhea. She notes bleeding after BM's. She also notes it looks like "gravel" in her stool. She reports continued diminished taste and smell since she had Covid in 08/2021. She has a very large bruise to her inner right thigh. She reports she was working on [employer] and bumps her leg with the albums while working on them. She reports she first saw it the day before yesterday. She expressed concern with the sagging skin on her arms. She notes it seems like it developed quickly in the last year. She notes she continues to live by herself.   All other systems were reviewed with the patient and are negative.  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 Abnl nuc 11/2014. Cath 12/2014 - turned down for CABG. Ultimately s/p TTVP, rotational atherectomy, PTCA and stenting of the ostial LCx and left main into the LAD (crush technique), and IVUS of the LAD/Left main. // b. Myoview 11/17: EF 48, poor quality/significant artifact; inf-lateral, inferior ischemia; Intermediate Risk   Chronic atrial fibrillation ([place])    a.  First noted post-op 9/15 spinal fusion.  She had cardioversion, not on anticoagulation. Fall risk, unsteady. // failed DCCV // Holter 10/17: AFib, Avg HR 97, PVCs, no other arrhythmia   Chronic back pain greater than 3 months duration    a. spinal stenosis.  Spinal fusion with rods in 2/15 at [facility] spinal fusion 9/15.   Chronic diastolic CHF    Echo 1/09:  EF 50-55%, trivial AI, midl MR, mod LAE, PASP 37 mmHg // Echocardiogram 03/2020: EF 45-50, no RWMA, mild LVH, mild reduced RVSF, mildly elevated PASP (RVSP 38.3), mild LAE, mild MR, mild to mod TR,  trivial AI // Echo 9/21: EF 45, mild LVH, mildly reduced RV SF, moderate LAE, mild RAE, mild MR, mild AI      Circadian rhythm sleep disorder    CTS (carpal tunnel syndrome)    Deficiency anemia 11/10/2014   Depression    Diarrhea    Diverticulitis of colon    Esophageal stricture    Gastritis    Gastroesophageal hernia 07/06/2013   GERD (gastroesophageal reflux disease)    Hiatal hernia    History of blood transfusion    "most of them related to OR's"    HTN (hypertension)    Hyperlipidemia    IBS (irritable bowel syndrome)    Incontinence 10/13/2012   Insomnia    Ischemic chest pain ([place])    Memory disorder 32/35/5732   Metabolic syndrome 2/0/2542   MGUS (monoclonal gammopathy of unknown significance) dx'd 11/2014   a. Neg BMB 11/2014.   Multiple falls    Obesity    Obstructive sleep apnea    "have mask; don't wear it" (01/07/2015)   Orthostasis  PAT (paroxysmal atrial tachycardia) ([place])    Personal history of colonic polyps 10/25/2011 & 12/02/11   not retrieved Dr Lyla Son & tubular adenomas   Pneumonia 03/2014   Sinus bradycardia    a. Baseline HR 50s-60s.   Stroke [facility]) early 2000's   "small"; denies residual on 01/07/2015)    SURGICAL HISTORY: Past Surgical History:  Procedure Laterality Date   BACK SURGERY     BONE MARROW BIOPSY  11/2014   CARDIAC CATHETERIZATION  12/27/2014   Procedure: INTRAVASCULAR PRESSURE WIRE/FFR STUDY;  Surgeon: Larey Dresser, MD;  Location: [facility] CATH LAB;  Service: Cardiovascular;;   CARDIAC CATHETERIZATION N/A 11/25/2016   Procedure: Left Heart Cath and Coronary Angiography;  Surgeon: Sherren Mocha, MD;  Location: [place] CV LAB;  Service: Cardiovascular;  Laterality: N/A;   CARDIOVERSION N/A 02/13/2016   Procedure: CARDIOVERSION;  Surgeon: Josue Hector, MD;  Location: [place];  Service: Cardiovascular;  Laterality: N/A;   CARPAL TUNNEL RELEASE Right 1980's   CATARACT EXTRACTION Bilateral    CATARACT EXTRACTION W/  INTRAOCULAR LENS  IMPLANT, BILATERAL  2000's   CORONARY ANGIOPLASTY WITH STENT PLACEMENT  01/07/2015   "2"   CORONARY STENT PLACEMENT     DILATION AND CURETTAGE OF UTERUS     ESOPHAGOGASTRODUODENOSCOPY (EGD) WITH ESOPHAGEAL DILATION  "several times"   JOINT REPLACEMENT     KNEE ARTHROSCOPY Left 1995   LAPAROSCOPIC CHOLECYSTECTOMY  2003   LEFT HEART CATHETERIZATION WITH CORONARY ANGIOGRAM N/A 12/27/2014   Procedure: LEFT HEART CATHETERIZATION WITH CORONARY ANGIOGRAM;  Surgeon: Larey Dresser, MD;  Location: [facility] CATH LAB;  Service: Cardiovascular;  Laterality: N/A;   PERCUTANEOUS CORONARY ROTOBLATOR INTERVENTION (PCI-R)  01/07/2015   PERCUTANEOUS CORONARY ROTOBLATOR INTERVENTION (PCI-R) N/A 01/07/2015   Procedure: PERCUTANEOUS CORONARY ROTOBLATOR INTERVENTION (PCI-R);  Surgeon: Blane Ohara, MD;  Location: [facility] CATH LAB;  Service: Cardiovascular;  Laterality: N/A;   POSTERIOR FUSION THORACIC SPINE  08/2015   POSTERIOR LUMBAR FUSION  01/2015   TOTAL KNEE ARTHROPLASTY Bilateral 1990's - 2000's    I have reviewed the social history and family history with the patient and they are unchanged from previous note.  ALLERGIES:  is allergic to buprenorphine hcl, cymbalta [duloxetine hcl], morphine and related, oxycodone-acetaminophen, valium [diazepam], statins, altace [ramipril], floxin [ofloxacin], hydromorphone, lovaza [omega-3-acid ethyl esters], trilipix [choline fenofibrate], and zetia [ezetimibe].  MEDICATIONS:  Current Outpatient Medications  Medication Sig Dispense Refill   albuterol (VENTOLIN HFA) 108 (90 Base) MCG/ACT inhaler Inhale 2 puffs into the lungs every 6 (six) hours as needed for wheezing or shortness of breath. 6.7 g 1   aspirin EC 81 MG tablet Take 1 tablet (81 mg total) by mouth daily. 90 tablet 3   Cholecalciferol (VITAMIN D) 2000 units tablet Take 2,000 Units by mouth daily.     clobetasol cream (TEMOVATE) 8.78 % Apply 1 application topically See admin instructions. Applies to  vaginal area twice a week.     diltiazem (TIAZAC) 120 MG 24 hr capsule TAKE 1 CAPSULE (120 MG TOTAL) BY MOUTH IN THE MORNING AND AT BEDTIME. Please send refill request to Cardiologist. 60 capsule 0   diphenoxylate-atropine (LOMOTIL) 2.5-0.025 MG tablet Take 1-2 tablets orally up to 4x per day     ELIQUIS 5 MG TABS tablet Take 1 tablet (5 mg total) by mouth 2 (two) times daily. 180 tablet 2   Eszopiclone 3 MG TABS Take 1 tablet (3 mg total) by mouth at bedtime. For insomnia,take immediately before bedtime. 90 tablet 1  fentaNYL (DURAGESIC - DOSED MCG/HR) 50 MCG/HR Place 1 patch (50 mcg total) onto the skin every 3 (three) days. 10 patch 0   FLUoxetine (PROZAC) 40 MG capsule Take 1 capsule (40 mg total) by mouth daily. (NEEDS TO BE SEEN BEFORE NEXT REFILL) 30 capsule 0   furosemide (LASIX) [name] MG tablet Take 1 tablet ([name] mg total) by mouth daily. 90 tablet 0   HYDROcodone-acetaminophen (NORCO) 10-325 MG per tablet Take 1 tablet by mouth every 4 (four) hours as needed for moderate pain.      LINZESS 145 MCG CAPS capsule Take 145 mcg by mouth as needed (constipation).     Multiple Vitamin (MULTIVITAMIN) tablet Take 1 tablet by mouth daily. For supplement     nitroGLYCERIN (NITROSTAT) 0.4 MG SL tablet Place 1 tablet (0.4 mg total) under the tongue every 5 (five) minutes as needed for chest pain (up to 3 doses). 25 tablet 3   NONFORMULARY OR COMPOUNDED ITEM Apply 1 application topically daily. Antifungal solution: Terbinafine 3%, Fluconazole 2%, Tea Tree Oil 5%, Urea 10%, Ibuprofen 2% in DMSO suspension #89m     pantoprazole (PROTONIX) 40 MG tablet Take 1 tablet (40 mg total) by mouth 2 (two) times daily. 180 tablet 1   potassium chloride (KLOR-CON) 10 MEQ tablet Take 10 mEq by mouth daily.     rosuvastatin (CRESTOR) 10 MG tablet Take 5 mg alternating with 10 mg daily for cholesterol 90 tablet 0   triamcinolone cream (KENALOG) 0.1 % Apply 1 application topically 3 (three) times daily.      No current  facility-administered medications for this visit.    PHYSICAL EXAMINATION: ECOG PERFORMANCE STATUS: 2 - Symptomatic, <50% confined to bed  Vitals:   10/13/21 1409  BP: 120/78  Pulse: 83  Resp: 18  Temp: 98.2 F (36.8 C)  SpO2: 98%   Wt Readings from Last 3 Encounters:  10/13/21 179 lb 1.6 oz (81.2 kg)  07/21/21 178 lb 3.2 oz ([name].8 kg)  06/02/21 175 lb 12.8 oz (79.7 kg)     GENERAL:alert, no distress and comfortable SKIN: skin color normal, no rashes or significant lesions, (+) large bruise to inner right thigh. EYES: normal, Conjunctiva are pink and non-injected, sclera clear  NEURO: alert & oriented x 3 with fluent speech  LABORATORY DATA:  I have reviewed the data as listed CBC Latest Ref Rng & Units 10/13/2021 08/05/2021 07/21/2021  WBC 4.0 - 10.5 K/uL 4.6 6.8 6.8  Hemoglobin 12.0 - 15.0 g/dL 10.5(L) 10.7(L) 10.7(L)  Hematocrit 36.0 - 46.0 % 33.1(L) 34.3 34.0  Platelets 150 - 400 K/uL 211 238 229     CMP Latest Ref Rng & Units 10/13/2021 06/02/2021 01/31/2021  Glucose 70 - 99 mg/dL 129(H) 109(H) 148(H)  BUN 8 - 23 mg/dL _0 Creatinine 0.44 - 1.00 mg/dL 0.72 0.83 0.69  Sodium 135 - 145 mmol/L 138 142 138  Potassium 3.5 - 5.1 mmol/L 3.9 4.1 4.3  Chloride 98 - 111 mmol/L 101 100 101  CO2 22 - 32 mmol/L _1 Calcium 8.9 - 10.3 mg/dL 8.6(L) 9.2 9.2  Total Protein 6.5 - 8.1 g/dL 6.5 6.5 6.6  Total Bilirubin 0.3 - 1.2 mg/dL 0.7 0.3 0.5  Alkaline Phos 38 - 126 U/L 110 121 111  AST 15 - 41 U/L 24 22 32  ALT 0 - 44 U/L 21 19 40(H)      RADIOGRAPHIC STUDIES: I have personally reviewed the radiological images as listed and agreed with the  findings in the report. No results found.    No orders of the defined types were placed in this encounter.  All questions were answered. The patient knows to call the clinic with any problems, questions or concerns. No barriers to learning was detected. The total time spent in the appointment was 30 minutes.     Truitt Merle, MD 10/13/2021   I, Wilburn Mylar, am acting as scribe for Truitt Merle, MD.   I have reviewed the above documentation for accuracy and completeness, and I agree with the above.

## 2021-10-14 ENCOUNTER — Encounter: Payer: Self-pay | Admitting: Family Medicine

## 2021-10-14 ENCOUNTER — Other Ambulatory Visit: Payer: Self-pay | Admitting: Family Medicine

## 2021-10-14 LAB — KAPPA/LAMBDA LIGHT CHAINS
Kappa free light chain: 30.5 mg/L — ABNORMAL HIGH (ref 3.3–19.4)
Kappa, lambda light chain ratio: 1.06 (ref 0.26–1.65)
Lambda free light chains: 28.9 mg/L — ABNORMAL HIGH (ref 5.7–26.3)

## 2021-10-14 MED ORDER — FLUOXETINE HCL 40 MG PO CAPS: 40.0000 mg | ORAL_CAPSULE | Freq: Every day | ORAL | 3 refills | Status: DC

## 2021-10-15 ENCOUNTER — Encounter: Payer: Self-pay | Admitting: Hematology

## 2021-10-15 ENCOUNTER — Telehealth: Payer: Self-pay

## 2021-10-15 LAB — PROTEIN ELECTROPHORESIS, SERUM, WITH REFLEX
A/G Ratio: 1.4 (ref 0.7–1.7)
Albumin ELP: 3.4 g/dL (ref 2.9–4.4)
Alpha-1-Globulin: 0.3 g/dL (ref 0.0–0.4)
Alpha-2-Globulin: 0.6 g/dL (ref 0.4–1.0)
Beta Globulin: 0.8 g/dL (ref 0.7–1.3)
Gamma Globulin: 0.7 g/dL (ref 0.4–1.8)
Globulin, Total: 2.4 g/dL (ref 2.2–3.9)
Total Protein ELP: 5.8 g/dL — ABNORMAL LOW (ref 6.0–8.5)

## 2021-10-15 NOTE — Telephone Encounter (Signed)
This nurse reached out to patients daughter and made her aware of lab results and recommendations of the MD.  Daughter states that oral iron really upsets the patients stomach.  She would like to try the iv iron to see if she can tolerate that better.  This nurse informed her that scheduling will be notified and we will get her scheduled for an IV infusion and reach back out to her with a date and time.  She states they have no availability this week but she would be able to do 11/1 or 11/4 and she can do 11/2 as a last resort.  I advised that scheduling will be made aware.  No further questions or concerns at this time.

## 2021-10-15 NOTE — Addendum Note (Signed)
Addended by: Truitt Merle on: 10/15/2021 03:53 PM   Modules accepted: Orders

## 2021-10-15 NOTE — Telephone Encounter (Signed)
-----   Message from Truitt Merle, MD sent at 10/13/2021  9:54 PM EDT ----- Please let pt's daughter know her iron level is low, ask if pt is willing to increase oral iron and repeat lab in a month, or I can give her IV iron which will likely improve her anemia faster,let me know. Thanks   Truitt Merle  10/13/2021

## 2021-10-16 ENCOUNTER — Telehealth: Payer: Self-pay

## 2021-10-16 NOTE — Telephone Encounter (Signed)
Pt's daughter call in stating the someone called her yesterday 10/15/2021 to schedule her mom's appts for lab, Dr. Burr Medico, and iron infusion but there was a question about time availability regarding Dr. Ernestina Penna schedule.  The person stated they will contact Dr. Burr Medico and will give the pt's daughter a call back to schedule her mom's appt.  Unfortunately, she did not get a returned call and contacted Dr. Ernestina Penna office today 10/16/2021.  Sent a message to scheduling to please contact the pt's daughter so they can get the pt's appts scheduled.

## 2021-10-17 ENCOUNTER — Ambulatory Visit: Payer: Medicare HMO | Admitting: Podiatry

## 2021-10-17 ENCOUNTER — Encounter: Payer: Self-pay | Admitting: Podiatry

## 2021-10-17 ENCOUNTER — Other Ambulatory Visit: Payer: Self-pay

## 2021-10-17 DIAGNOSIS — M79675 Pain in left toe(s): Secondary | ICD-10-CM | POA: Diagnosis not present

## 2021-10-17 DIAGNOSIS — Q828 Other specified congenital malformations of skin: Secondary | ICD-10-CM | POA: Diagnosis not present

## 2021-10-17 DIAGNOSIS — G609 Hereditary and idiopathic neuropathy, unspecified: Secondary | ICD-10-CM | POA: Diagnosis not present

## 2021-10-17 DIAGNOSIS — B351 Tinea unguium: Secondary | ICD-10-CM

## 2021-10-17 DIAGNOSIS — M79674 Pain in right toe(s): Secondary | ICD-10-CM | POA: Diagnosis not present

## 2021-10-21 ENCOUNTER — Other Ambulatory Visit: Payer: Self-pay

## 2021-10-21 ENCOUNTER — Other Ambulatory Visit: Payer: Self-pay | Admitting: Family Medicine

## 2021-10-21 ENCOUNTER — Ambulatory Visit (INDEPENDENT_AMBULATORY_CARE_PROVIDER_SITE_OTHER): Payer: Medicare HMO

## 2021-10-21 ENCOUNTER — Encounter: Payer: Self-pay | Admitting: Family Medicine

## 2021-10-21 ENCOUNTER — Ambulatory Visit: Payer: Medicare HMO | Admitting: Family Medicine

## 2021-10-21 VITALS — BP 154/62 | HR 68 | Temp 98.5°F | Ht 66.0 in | Wt 173.0 lb

## 2021-10-21 DIAGNOSIS — R0989 Other specified symptoms and signs involving the circulatory and respiratory systems: Secondary | ICD-10-CM | POA: Diagnosis not present

## 2021-10-21 DIAGNOSIS — R059 Cough, unspecified: Secondary | ICD-10-CM

## 2021-10-21 DIAGNOSIS — J189 Pneumonia, unspecified organism: Secondary | ICD-10-CM

## 2021-10-21 DIAGNOSIS — R531 Weakness: Secondary | ICD-10-CM

## 2021-10-21 DIAGNOSIS — R0602 Shortness of breath: Secondary | ICD-10-CM | POA: Diagnosis not present

## 2021-10-21 DIAGNOSIS — M791 Myalgia, unspecified site: Secondary | ICD-10-CM

## 2021-10-21 LAB — VERITOR FLU A/B WAIVED
Influenza A: NEGATIVE
Influenza B: NEGATIVE

## 2021-10-21 MED ORDER — AMOXICILLIN 400 MG/5ML PO SUSR: 1000.0000 mg | Freq: Two times a day (BID) | ORAL | 0 refills | Status: DC

## 2021-10-21 MED ORDER — GUAIFENESIN 100 MG/5ML PO LIQD: 5.0000 mL | ORAL | 0 refills | Status: DC | PRN

## 2021-10-21 MED ORDER — ALBUTEROL SULFATE HFA 108 (90 BASE) MCG/ACT IN AERS
2.0000 | INHALATION_SPRAY | Freq: Four times a day (QID) | RESPIRATORY_TRACT | 2 refills | Status: DC | PRN
Start: 2021-10-21 — End: 2021-10-24

## 2021-10-21 MED ORDER — CEFTRIAXONE SODIUM 1 G IJ SOLR
1.0000 g | Freq: Once | INTRAMUSCULAR | Status: AC
  Administered 2021-10-21: 1 g via INTRAMUSCULAR

## 2021-10-21 MED ORDER — AZITHROMYCIN 250 MG PO TABS: ORAL_TABLET | ORAL | 0 refills | Status: DC

## 2021-10-21 NOTE — Patient Instructions (Signed)
If symptoms worsen, go to ED for evaluation and treatment.

## 2021-10-21 NOTE — Progress Notes (Addendum)
Subjective:  Patient ID: [name] [name], female    DOB: 01/03/1941, 80 y.o.   MRN: [id number]  Patient Care Team: [name], DO as PCP - General (Family Medicine) Sherren Mocha, MD as PCP - Cardiology (Cardiology) Vickie Epley, MD as PCP - Electrophysiology (Cardiology) Suella Broad, MD (Physical Medicine and Rehabilitation) Hillary Bow, MD (Cardiology) Newton Pigg, MD (Obstetrics and Gynecology) Liliane Shi, PA-C as Physician Assistant (Cardiology)   Chief Complaint:  Fatigue (Cough, weakness, no fever, congestion post-nasal  drip, ear fullness/SOB)   HPI: [name] [name] is a 80 y.o. female presenting on 10/21/2021 for Fatigue (Cough, weakness, no fever, congestion post-nasal  drip, ear fullness/SOB)  Patient presents today with complaints of cough, congestion, shortness of breath, rhinorrhea, myalgias, bilateral ear fullness, and generalized weakness.  She was treated with antiviral therapy for COVID in September.  States she felt better for a while but then became ill again several days ago.  She has had a fever and chills.  Her appetite is decreased and she has significant fatigue.  She has been urinating normally.  No confusion.  She denies recent sick contacts.  Relevant past medical, surgical, family, and social history reviewed and updated as indicated.  Allergies and medications reviewed and updated. Data reviewed: Chart in Epic.   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 Abnl nuc 11/2014. Cath 12/2014 - turned down for CABG. Ultimately s/p TTVP, rotational atherectomy, PTCA and stenting of the ostial LCx and left main into the LAD (crush technique), and IVUS of the LAD/Left main. // b. Myoview 11/17: EF 48, poor  quality/significant artifact; inf-lateral, inferior ischemia; Intermediate Risk   Chronic atrial fibrillation ([place])    a.  First noted post-op 9/15 spinal fusion.  She had cardioversion, not on anticoagulation. Fall risk, unsteady. // failed DCCV // Holter 10/17: AFib, Avg HR 97, PVCs, no other arrhythmia   Chronic back pain greater than 3 months duration    a. spinal stenosis.  Spinal fusion with rods in 2/15 at [facility] spinal fusion 9/15.   Chronic diastolic CHF    Echo 9/76:  EF 50-55%, trivial AI, midl MR, mod LAE, PASP 37 mmHg // Echocardiogram 03/2020: EF 45-50, no RWMA, mild LVH, mild reduced RVSF, mildly elevated PASP (RVSP 38.3), mild LAE, mild MR, mild to mod TR, trivial AI // Echo 9/21: EF 45, mild LVH, mildly reduced RV SF, moderate LAE, mild RAE, mild MR, mild AI      Circadian rhythm sleep disorder    CTS (carpal tunnel syndrome)    Deficiency anemia 11/10/2014   Depression    Diarrhea    Diverticulitis of colon    Esophageal stricture    Gastritis    Gastroesophageal hernia 07/06/2013   GERD (gastroesophageal reflux disease)    Hiatal hernia    History of blood transfusion    "most of them related to OR's"    HTN (hypertension)    Hyperlipidemia    IBS (irritable bowel syndrome)    Incontinence 10/13/2012   Insomnia    Ischemic chest pain ([place])    Memory disorder 73/41/9379   Metabolic syndrome 0/01/4096   MGUS (monoclonal gammopathy of unknown significance)  dx'd 11/2014   a. Neg BMB 11/2014.   Multiple falls    Obesity    Obstructive sleep apnea    "have mask; don't wear it" (01/07/2015)   Orthostasis    PAT (paroxysmal atrial tachycardia) ([place])    Personal history of colonic polyps 10/25/2011 & 12/02/11   not retrieved Dr Lyla Son & tubular adenomas   Pneumonia 03/2014   Sinus bradycardia    a. Baseline HR 50s-60s.   Stroke [facility]) early 2000's   "small"; denies residual on 01/07/2015)    Past Surgical History:  Procedure Laterality Date   BACK  SURGERY     BONE MARROW BIOPSY  11/2014   CARDIAC CATHETERIZATION  12/27/2014   Procedure: INTRAVASCULAR PRESSURE WIRE/FFR STUDY;  Surgeon: Larey Dresser, MD;  Location: [facility] CATH LAB;  Service: Cardiovascular;;   CARDIAC CATHETERIZATION N/A 11/25/2016   Procedure: Left Heart Cath and Coronary Angiography;  Surgeon: Sherren Mocha, MD;  Location: [place] CV LAB;  Service: Cardiovascular;  Laterality: N/A;   CARDIOVERSION N/A 02/13/2016   Procedure: CARDIOVERSION;  Surgeon: Josue Hector, MD;  Location: [place];  Service: Cardiovascular;  Laterality: N/A;   CARPAL TUNNEL RELEASE Right 1980's   CATARACT EXTRACTION Bilateral    CATARACT EXTRACTION W/ INTRAOCULAR LENS  IMPLANT, BILATERAL  2000's   CORONARY ANGIOPLASTY WITH STENT PLACEMENT  01/07/2015   "2"   CORONARY STENT PLACEMENT     DILATION AND CURETTAGE OF UTERUS     ESOPHAGOGASTRODUODENOSCOPY (EGD) WITH ESOPHAGEAL DILATION  "several times"   JOINT REPLACEMENT     KNEE ARTHROSCOPY Left 1995   LAPAROSCOPIC CHOLECYSTECTOMY  2003   LEFT HEART CATHETERIZATION WITH CORONARY ANGIOGRAM N/A 12/27/2014   Procedure: LEFT HEART CATHETERIZATION WITH CORONARY ANGIOGRAM;  Surgeon: Larey Dresser, MD;  Location: [facility] CATH LAB;  Service: Cardiovascular;  Laterality: N/A;   PERCUTANEOUS CORONARY ROTOBLATOR INTERVENTION (PCI-R)  01/07/2015   PERCUTANEOUS CORONARY ROTOBLATOR INTERVENTION (PCI-R) N/A 01/07/2015   Procedure: PERCUTANEOUS CORONARY ROTOBLATOR INTERVENTION (PCI-R);  Surgeon: Blane Ohara, MD;  Location[facility] C[facility] LAB;  Service: Cardiovascular;  Laterality: N/A;   POSTERIOR FUSION THORACIC SPINE  08/2015   POSTERIOR LUMBAR FUSION  01/2015   TOTAL KNEE ARTHROPLASTY Bilateral 1990's - 2000's    Social History   Socioeconomic History   Marital status: Widowed    Spouse name: Not on file   Number of children: 2   Years of education: HS   Highest education level: Not on file  Occupational History   Occupation: Licensed conveyancer- retired     Comment: retired  Tobacco Use   Smoking status: Never   Smokeless tobacco: Never  Vaping Use   Vaping Use: Never used  Substance and Sexual Activity   Alcohol use: No    Comment: 01/07/2015 "glass of wine at Christmas, maybe"   Drug use: No   Sexual activity: Never  Other Topics Concern   Not on file  Social History Narrative   Patient is right handed   Patient drinks 1-2 sodas daily.   patlient lives alone.   Social Determinants of Health   Financial Resource Strain: Not on file  Food Insecurity: Not on file  Transportation Needs: Not on file  Physical Activity: Not on file  Stress: Not on file  Social Connections: Not on file  Intimate Partner Violence: Not on file    Outpatient Encounter Medications as of 10/21/2021  Medication Sig   albuterol (VENTOLIN HFA) 108 (90 Base) MCG/ACT inhaler Inhale 2 puffs into the  lungs every 6 (six) hours as needed for wheezing or shortness of breath.   amoxicillin (AMOXIL) 400 MG/5ML suspension Take 12.5 mLs (1,000 mg total) by mouth 2 (two) times daily for 10 days.   azithromycin (ZITHROMAX Z-PAK) 250 MG tablet As directed   guaiFENesin (ROBITUSSIN) 100 MG/5ML liquid Take 5 mLs by mouth every 4 (four) hours as needed for cough or to loosen phlegm.   aspirin EC 81 MG tablet Take 1 tablet (81 mg total) by mouth daily.   Cholecalciferol (VITAMIN D) 2000 units tablet Take 2,000 Units by mouth daily.   clobetasol cream (TEMOVATE) 9.03 % Apply 1 application topically See admin instructions. Applies to vaginal area twice a week.   diltiazem (TIAZAC) 120 MG 24 hr capsule TAKE 1 CAPSULE (120 MG TOTAL) BY MOUTH IN THE MORNING AND AT BEDTIME. Please send refill request to Cardiologist.   diphenoxylate-atropine (LOMOTIL) 2.5-0.025 MG tablet Take 1-2 tablets orally up to 4x per day   ELIQUIS 5 MG TABS tablet Take 1 tablet (5 mg total) by mouth 2 (two) times daily.   Eszopiclone 3 MG TABS Take 1 tablet (3 mg total) by mouth at bedtime. For insomnia,take  immediately before bedtime.   fentaNYL (DURAGESIC - DOSED MCG/HR) 50 MCG/HR Place 1 patch (50 mcg total) onto the skin every 3 (three) days.   FLUoxetine (PROZAC) 40 MG capsule Take 1 capsule (40 mg total) by mouth daily.   furosemide (LASIX) 80 MG tablet Take 1 tablet (80 mg total) by mouth daily.   HYDROcodone-acetaminophen (NORCO) 10-325 MG per tablet Take 1 tablet by mouth every 4 (four) hours as needed for moderate pain.    LINZESS 145 MCG CAPS capsule Take 145 mcg by mouth as needed (constipation).   Multiple Vitamin (MULTIVITAMIN) tablet Take 1 tablet by mouth daily. For supplement   nitroGLYCERIN (NITROSTAT) 0.4 MG SL tablet Place 1 tablet (0.4 mg total) under the tongue every 5 (five) minutes as needed for chest pain (up to 3 doses).   NONFORMULARY OR COMPOUNDED ITEM Apply 1 application topically daily. Antifungal solution: Terbinafine 3%, Fluconazole 2%, Tea Tree Oil 5%, Urea 10%, Ibuprofen 2% in DMSO suspension #50m   pantoprazole (PROTONIX) 40 MG tablet Take 1 tablet (40 mg total) by mouth 2 (two) times daily.   potassium chloride (KLOR-CON) 10 MEQ tablet Take 10 mEq by mouth daily.   rosuvastatin (CRESTOR) 10 MG tablet Take 5 mg alternating with 10 mg daily for cholesterol   triamcinolone cream (KENALOG) 0.1 % Apply 1 application topically 3 (three) times daily.    [DISCONTINUED] albuterol (VENTOLIN HFA) 108 (90 Base) MCG/ACT inhaler Inhale 2 puffs into the lungs every 6 (six) hours as needed for wheezing or shortness of breath.   No facility-administered encounter medications on file as of 10/21/2021.    Allergies  Allergen Reactions   Buprenorphine Hcl Itching   Cymbalta [Duloxetine Hcl] Other (See Comments)    Other reaction(s): Other (See Comments) Personality changes - crying  2014   Morphine And Related Itching   Oxycodone-Acetaminophen Other (See Comments)    Personality changes    Valium [Diazepam] Other (See Comments)    Personality changes - "in another world" ;   Hallucinations  2015   Statins Other (See Comments)    Other reaction(s): Other (See Comments) Muscle weakness Muscle weakness   Morphine Itching   Altace [Ramipril] Other (See Comments)    unknown   Floxin [Ofloxacin] Other (See Comments)    unknown   Hydromorphone Other (See  Comments)    Other reaction(s): Confusion (intolerance) unknown   Lovaza [Omega-3-Acid Ethyl Esters] Other (See Comments)    Muscle weakness    Trilipix [Choline Fenofibrate] Other (See Comments)    Muscle weakness    Zetia [Ezetimibe] Other (See Comments)    Muscle weakness     Review of Systems  Constitutional:  Positive for activity change, appetite change, chills, fatigue and fever. Negative for diaphoresis and unexpected weight change.  HENT:  Positive for congestion, postnasal drip and rhinorrhea.   Eyes: Negative.   Respiratory:  Positive for cough and shortness of breath. Negative for chest tightness.   Cardiovascular:  Negative for chest pain, palpitations and leg swelling.  Gastrointestinal:  Negative for abdominal pain, blood in stool, constipation, diarrhea, nausea and vomiting.  Endocrine: Negative.   Genitourinary:  Negative for decreased urine volume, difficulty urinating, dysuria, frequency and urgency.  Musculoskeletal:  Positive for gait problem and myalgias. Negative for arthralgias, back pain, joint swelling, neck pain and neck stiffness.  Skin: Negative.   Allergic/Immunologic: Negative.   Neurological:  Positive for weakness. Negative for dizziness and headaches.  Hematological: Negative.   Psychiatric/Behavioral:  Negative for confusion, hallucinations, sleep disturbance and suicidal ideas.   All other systems reviewed and are negative.      Objective:  BP (!) 154/62   Pulse 68   Temp 98.5 F (36.9 C)   Ht 5' 6"  (1.676 m)   Wt 173 lb (78.5 kg)   SpO2 92%   BMI 27.92 kg/m    Wt Readings from Last 3 Encounters:  10/21/21 173 lb (78.5 kg)  10/13/21 179 lb 1.6 oz (81.2 kg)   07/21/21 178 lb 3.2 oz (80.8 kg)    Physical Exam Vitals and nursing note reviewed.  Constitutional:      General: She is not in acute distress.    Appearance: She is well-developed, well-groomed and overweight. She is ill-appearing. She is not toxic-appearing or diaphoretic.  HENT:     Head: Normocephalic and atraumatic.     Right Ear: Tympanic membrane, ear canal and external ear normal.     Left Ear: Tympanic membrane, ear canal and external ear normal.     Nose: Nose normal.     Mouth/Throat:     Lips: Pink.     Mouth: Mucous membranes are moist.     Pharynx: Posterior oropharyngeal erythema present. No pharyngeal swelling, oropharyngeal exudate or uvula swelling.     Tonsils: No tonsillar exudate or tonsillar abscesses.  Eyes:     General: Lids are normal.     Extraocular Movements: Extraocular movements intact.     Conjunctiva/sclera: Conjunctivae normal.     Pupils: Pupils are equal, round, and reactive to light.  Neck:     Vascular: No carotid bruit.  Cardiovascular:     Rate and Rhythm: Normal rate. Rhythm irregularly irregular.     Heart sounds: Normal heart sounds.    No S3 sounds.  Pulmonary:     Effort: Pulmonary effort is normal.     Breath sounds: No decreased air movement. Examination of the right-lower field reveals decreased breath sounds and rhonchi. Examination of the left-lower field reveals decreased breath sounds and rhonchi. Decreased breath sounds and rhonchi present. No wheezing or rales.  Abdominal:     General: Bowel sounds are normal.     Palpations: Abdomen is soft.     Tenderness: There is no abdominal tenderness.  Musculoskeletal:     Cervical back: Normal range of motion  and neck supple. No rigidity.     Right lower leg: Edema (trace) present.     Left lower leg: Edema (trace) present.  Lymphadenopathy:     Cervical: No cervical adenopathy.  Skin:    General: Skin is warm and dry.     Capillary Refill: Capillary refill takes less than 2  seconds.     Coloration: Skin is pale.  Neurological:     General: No focal deficit present.     Mental Status: She is alert and oriented to person, place, and time.     Motor: Weakness (generalized) present.     Gait: Gait abnormal (using walker).  Psychiatric:        Mood and Affect: Mood normal.        Behavior: Behavior normal.        Thought Content: Thought content normal.        Judgment: Judgment normal.    Results for orders placed or performed in visit on 10/13/21  Retic Panel  Result Value Ref Range   Retic Ct Pct 1.6 0.4 - 3.1 %   RBC. 3.83 (L) 3.87 - 5.11 MIL/uL   Retic Count, Absolute 61.3 19.0 - 186.0 K/uL   Immature Retic Fract 12.6 2.3 - 15.9 %   Reticulocyte Hemoglobin 28.6 >27.9 pg  Iron and TIBC  Result Value Ref Range   Iron 35 28 - 170 ug/dL   TIBC 347 250 - 450 ug/dL   Saturation Ratios 10 (L) 10.4 - 31.8 %   UIBC 312 ug/dL  Ferritin  Result Value Ref Range   Ferritin 3 (L) 11 - 307 ng/mL  CMP (Cancer Center only)  Result Value Ref Range   Sodium 138 135 - 145 mmol/L   Potassium 3.9 3.5 - 5.1 mmol/L   Chloride 101 98 - 111 mmol/L   CO2 31 22 - 32 mmol/L   Glucose, Bld 129 (H) 70 - 99 mg/dL   BUN 12 8 - 23 mg/dL   Creatinine 0.72 0.44 - 1.00 mg/dL   Calcium 8.6 (L) 8.9 - 10.3 mg/dL   Total Protein 6.5 6.5 - 8.1 g/dL   Albumin 3.7 3.5 - 5.0 g/dL   AST 24 15 - 41 U/L   ALT 21 0 - 44 U/L   Alkaline Phosphatase 110 38 - 126 U/L   Total Bilirubin 0.7 0.3 - 1.2 mg/dL   GFR, Estimated >60 >60 mL/min   Anion gap 6 5 - 15  CBC with Differential (Cancer Center Only)  Result Value Ref Range   WBC Count 4.6 4.0 - 10.5 K/uL   RBC 3.79 (L) 3.87 - 5.11 MIL/uL   Hemoglobin 10.5 (L) 12.0 - 15.0 g/dL   HCT 33.1 (L) 36.0 - 46.0 %   MCV 87.3 80.0 - 100.0 fL   MCH 27.7 26.0 - 34.0 pg   MCHC 31.7 30.0 - 36.0 g/dL   RDW 17.2 (H) 11.5 - 15.5 %   Platelet Count 211 150 - 400 K/uL   nRBC 0.0 0.0 - 0.2 %   Neutrophils Relative % 59 %   Neutro Abs 2.7 1.7 - 7.7  K/uL   Lymphocytes Relative 31 %   Lymphs Abs 1.4 0.7 - 4.0 K/uL   Monocytes Relative 8 %   Monocytes Absolute 0.4 0.1 - 1.0 K/uL   Eosinophils Relative 2 %   Eosinophils Absolute 0.1 0.0 - 0.5 K/uL   Basophils Relative 0 %   Basophils Absolute 0.0 0.0 - 0.1 K/uL  Immature Granulocytes 0 %   Abs Immature Granulocytes 0.02 0.00 - 0.07 K/uL  Kappa/lambda light chains  Result Value Ref Range   Kappa free light chain 30.5 (H) 3.3 - 19.4 mg/L   Lambda free light chains 28.9 (H) 5.7 - 26.3 mg/L   Kappa, lambda light chain ratio 1.06 0.26 - 1.65  SPEP with reflex to IFE  Result Value Ref Range   Total Protein ELP 5.8 (L) 6.0 - 8.5 g/dL   Albumin ELP 3.4 2.9 - 4.4 g/dL   Alpha-1-Globulin 0.3 0.0 - 0.4 g/dL   Alpha-2-Globulin 0.6 0.4 - 1.0 g/dL   Beta Globulin 0.8 0.7 - 1.3 g/dL   Gamma Globulin 0.7 0.4 - 1.8 g/dL   M-Spike, % Not Observed Not Observed g/dL   Globulin, Total 2.4 2.2 - 3.9 g/dL   A/G Ratio 1.4 0.7 - 1.7   Comment Comment    SPEP Interpretation Comment      Influenza negative.  X-Ray: CXR: LLL pneumonia, possible early RLLL pneumonia. Preliminary x-ray reading by Monia Pouch, FNP-C, [facility].   Pertinent labs & imaging results that were available during my care of the patient were reviewed by me and considered in my medical decision making.  Assessment & Plan:  [name] [name] was seen today for fatigue.  Diagnoses and all orders for this visit:  Shortness of breath Cough in adult Chest congestion Chest x-ray concerning for bilateral lower lobe pneumonia.  Patient declines to go at hospital today.  Influenza test negative.  Will check CBC and BMP.  Guaifenesin as needed for cough and congestion, adequate hydration discussed in detail.  Albuterol as needed for cough and shortness of breath. -     DG Chest 2 View; Future -     Veritor Flu A/B Waived -     CBC with Differential/Platelet -     BMP8+EGFR -     guaiFENesin (ROBITUSSIN) 100 MG/5ML liquid; Take 5 mLs by mouth  every 4 (four) hours as needed for cough or to loosen phlegm. -     albuterol (VENTOLIN HFA) 108 (90 Base) MCG/ACT inhaler; Inhale 2 puffs into the lungs every 6 (six) hours as needed for wheezing or shortness of breath.  Myalgia Influenza negative.  Will check CBC and BMP.  Adequate hydration discussed in detail.  Tylenol as needed for fever and pain control. -     Veritor Flu A/B Waived -     CBC with Differential/Platelet -     BMP8+EGFR  Weakness Adequate hydration discussed in detail.  Will check CBC and BMP today.  Patient and family aware to report any new, worsening, or persistent symptoms. -     CBC with Differential/Platelet -     BMP8+EGFR  Community acquired pneumonia of left lower lobe of lung Chest x-ray concerning for bilateral lower lobe pneumonia, left worse than right.  Patient declines to go to ED today.  Will dose with 1 g of Rocephin IM in office.  Start Zithromax and amoxicillin tomorrow.  Guaifenesin and albuterol as needed for cough and congestion.  Follow-up in 3 days for reevaluation.  Patient and family aware if symptoms worsen she needs to go to the emergency room for evaluation. -     guaiFENesin (ROBITUSSIN) 100 MG/5ML liquid; Take 5 mLs by mouth every 4 (four) hours as needed for cough or to loosen phlegm. -     azithromycin (ZITHROMAX Z-PAK) 250 MG tablet; As directed -     amoxicillin (AMOXIL) 400 MG/5ML suspension;  Take 12.5 mLs (1,000 mg total) by mouth 2 (two) times daily for 10 days. -     albuterol (VENTOLIN HFA) 108 (90 Base) MCG/ACT inhaler; Inhale 2 puffs into the lungs every 6 (six) hours as needed for wheezing or shortness of breath.    Continue all other maintenance medications.  Follow up plan: Return in about 3 days (around 10/24/2021), or if symptoms worsen or fail to improve.   Continue healthy lifestyle choices, including diet (rich in fruits, vegetables, and lean proteins, and low in salt and simple carbohydrates) and exercise (at least 30  minutes of moderate physical activity daily).  Educational handout given for CAP  The above assessment and management plan was discussed with the patient. The patient verbalized understanding of and has agreed to the management plan. Patient is aware to call the clinic if they develop any new symptoms or if symptoms persist or worsen. Patient is aware when to return to the clinic for a follow-up visit. Patient educated on when it is appropriate to go to the emergency department.   Monia Pouch, FNP-C [facility] [phone]

## 2021-10-21 NOTE — Addendum Note (Signed)
Addended by: Geryl Rankins D on: 10/21/2021 05:01 PM   Modules accepted: Orders

## 2021-10-21 NOTE — Progress Notes (Signed)
  Subjective:  Patient ID: [name] [name], female    DOB: 1941/11/13,  MRN: [id number]  [name] [name] presents to clinic today for at risk foot care with history of peripheral neuropathy and callus(es) of right foot and painful thick toenails that are difficult to trim. Painful toenails interfere with ambulation. Aggravating factors include wearing enclosed shoe gear. Pain is relieved with periodic professional debridement. Painful calluses are aggravated when weightbearing with and without shoegear. Pain is relieved with periodic professional debridement.  Patient was brought to today's appointment by her son. Her daughter is on vacation with family.  PCP is [name], DO , and last visit was 06/02/2021.  Allergies  Allergen Reactions   Buprenorphine Hcl Itching   Cymbalta [Duloxetine Hcl] Other (See Comments)    Other reaction(s): Other (See Comments) Personality changes - crying  2014   Morphine And Related Itching   Oxycodone-Acetaminophen Other (See Comments)    Personality changes    Valium [Diazepam] Other (See Comments)    Personality changes - "in another world" ;  Hallucinations  2015   Statins Other (See Comments)    Other reaction(s): Other (See Comments) Muscle weakness Muscle weakness   Morphine Itching   Altace [Ramipril] Other (See Comments)    unknown   Floxin [Ofloxacin] Other (See Comments)    unknown   Hydromorphone Other (See Comments)    Other reaction(s): Confusion (intolerance) unknown   Lovaza [Omega-3-Acid Ethyl Esters] Other (See Comments)    Muscle weakness    Trilipix [Choline Fenofibrate] Other (See Comments)    Muscle weakness    Zetia [Ezetimibe] Other (See Comments)    Muscle weakness     Review of Systems: Negative except as noted in the HPI. Objective:   Constitutional [name] [name] is a pleasant 80 y.o. Caucasian female, in NAD. AAO x 3.   Vascular Capillary refill time to digits immediate b/l. Palpable DP pulse(s) b/l lower  extremities Faintly palpable PT pulse(s) b/l lower extremities. Pedal hair absent. Lower extremity skin temperature gradient within normal limits. No cyanosis or clubbing noted.  Neurologic Normal speech. Oriented to person, place, and time. Pt has subjective symptoms of neuropathy. Protective sensation intact 5/5 intact bilaterally with 10g monofilament b/l. Vibratory sensation intact b/l.  Dermatologic Pedal skin is warm and supple b/l LE. No open wounds b/l lower extremities. No interdigital macerations noted b/l LE. Toenails 1-5 b/l elongated, discolored, dystrophic, thickened, crumbly with subungual debris and tenderness to dorsal palpation. Porokeratotic lesion(s) submet head 5 right foot. No erythema, no edema, no drainage, no fluctuance.  Orthopedic: Normal muscle strength 5/5 to all lower extremity muscle groups bilaterally. Hammertoe(s) noted to the R 2nd toe.   Radiographs: None Assessment:  No diagnosis found. Plan:  Patient was evaluated and treated and all questions answered. Consent given for treatment as described below: -No new findings. No new orders. -Toenails 1-5 b/l were debrided in length and girth with sterile nail nippers and dremel without iatrogenic bleeding.  -Painful porokeratotic lesion(s) submet head 5 right foot pared and enucleated with sterile scalpel blade without incident. Total number of lesions debrided=1. -Patient/POA to call should there be question/concern in the interim.  Return in about 9 weeks (around 12/19/2021).  Marzetta Board, DPM

## 2021-10-22 LAB — CBC WITH DIFFERENTIAL/PLATELET
Basophils Absolute: 0 10*3/uL (ref 0.0–0.2)
Basos: 0 %
EOS (ABSOLUTE): 0 10*3/uL (ref 0.0–0.4)
Eos: 0 %
Hematocrit: 36.1 % (ref 34.0–46.6)
Hemoglobin: 11.2 g/dL (ref 11.1–15.9)
Immature Grans (Abs): 0.1 10*3/uL (ref 0.0–0.1)
Immature Granulocytes: 1 %
Lymphocytes Absolute: 0.7 10*3/uL (ref 0.7–3.1)
Lymphs: 6 %
MCH: 27.1 pg (ref 26.6–33.0)
MCHC: 31 g/dL — ABNORMAL LOW (ref 31.5–35.7)
MCV: 87 fL (ref 79–97)
Monocytes Absolute: 0.5 10*3/uL (ref 0.1–0.9)
Monocytes: 4 %
Neutrophils Absolute: 10.8 10*3/uL — ABNORMAL HIGH (ref 1.4–7.0)
Neutrophils: 89 %
Platelets: 227 10*3/uL (ref 150–450)
RBC: 4.14 x10E6/uL (ref 3.77–5.28)
RDW: 15.6 % — ABNORMAL HIGH (ref 11.7–15.4)
WBC: 12.1 10*3/uL — ABNORMAL HIGH (ref 3.4–10.8)

## 2021-10-22 LAB — BMP8+EGFR
BUN/Creatinine Ratio: 15 (ref 12–28)
BUN: 11 mg/dL (ref 8–27)
CO2: 27 mmol/L (ref 20–29)
Calcium: 9.1 mg/dL (ref 8.7–10.3)
Chloride: 101 mmol/L (ref 96–106)
Creatinine, Ser: 0.71 mg/dL (ref 0.57–1.00)
Glucose: 131 mg/dL — ABNORMAL HIGH (ref 70–99)
Potassium: 4 mmol/L (ref 3.5–5.2)
Sodium: 142 mmol/L (ref 134–144)
eGFR: 85 mL/min/{1.73_m2} (ref >59)

## 2021-10-24 ENCOUNTER — Ambulatory Visit (INDEPENDENT_AMBULATORY_CARE_PROVIDER_SITE_OTHER): Payer: Medicare HMO | Admitting: Family Medicine

## 2021-10-24 ENCOUNTER — Emergency Department (HOSPITAL_COMMUNITY): Payer: Medicare HMO

## 2021-10-24 ENCOUNTER — Other Ambulatory Visit: Payer: Self-pay

## 2021-10-24 ENCOUNTER — Encounter: Payer: Self-pay | Admitting: Family Medicine

## 2021-10-24 ENCOUNTER — Inpatient Hospital Stay (HOSPITAL_COMMUNITY)
Admission: EM | Admit: 2021-10-24 | Discharge: 2021-10-28 | DRG: 291 | Disposition: A | Discharge status: Home / Self Care | Payer: Medicare HMO | Attending: Internal Medicine | Admitting: Internal Medicine

## 2021-10-24 ENCOUNTER — Encounter (HOSPITAL_COMMUNITY): Payer: Self-pay | Admitting: Emergency Medicine

## 2021-10-24 VITALS — BP 102/65 | HR 113 | Temp 97.9°F | Ht 66.0 in | Wt 173.0 lb

## 2021-10-24 DIAGNOSIS — J189 Pneumonia, unspecified organism: Secondary | ICD-10-CM | POA: Diagnosis not present

## 2021-10-24 DIAGNOSIS — K582 Mixed irritable bowel syndrome: Secondary | ICD-10-CM | POA: Diagnosis present

## 2021-10-24 DIAGNOSIS — I5043 Acute on chronic combined systolic (congestive) and diastolic (congestive) heart failure: Secondary | ICD-10-CM | POA: Diagnosis present

## 2021-10-24 DIAGNOSIS — Z79899 Other long term (current) drug therapy: Secondary | ICD-10-CM

## 2021-10-24 DIAGNOSIS — Z885 Allergy status to narcotic agent status: Secondary | ICD-10-CM

## 2021-10-24 DIAGNOSIS — U071 COVID-19: Principal | ICD-10-CM

## 2021-10-24 DIAGNOSIS — G4733 Obstructive sleep apnea (adult) (pediatric): Secondary | ICD-10-CM | POA: Diagnosis present

## 2021-10-24 DIAGNOSIS — I110 Hypertensive heart disease with heart failure: Principal | ICD-10-CM | POA: Diagnosis present

## 2021-10-24 DIAGNOSIS — Z888 Allergy status to other drugs, medicaments and biological substances status: Secondary | ICD-10-CM

## 2021-10-24 DIAGNOSIS — R296 Repeated falls: Secondary | ICD-10-CM | POA: Diagnosis present

## 2021-10-24 DIAGNOSIS — Z96653 Presence of artificial knee joint, bilateral: Secondary | ICD-10-CM | POA: Diagnosis present

## 2021-10-24 DIAGNOSIS — K219 Gastro-esophageal reflux disease without esophagitis: Secondary | ICD-10-CM | POA: Diagnosis present

## 2021-10-24 DIAGNOSIS — D472 Monoclonal gammopathy: Secondary | ICD-10-CM | POA: Diagnosis present

## 2021-10-24 DIAGNOSIS — I2510 Atherosclerotic heart disease of native coronary artery without angina pectoris: Secondary | ICD-10-CM | POA: Diagnosis present

## 2021-10-24 DIAGNOSIS — R1310 Dysphagia, unspecified: Secondary | ICD-10-CM | POA: Diagnosis present

## 2021-10-24 DIAGNOSIS — Z8249 Family history of ischemic heart disease and other diseases of the circulatory system: Secondary | ICD-10-CM

## 2021-10-24 DIAGNOSIS — J90 Pleural effusion, not elsewhere classified: Secondary | ICD-10-CM

## 2021-10-24 DIAGNOSIS — Z20822 Contact with and (suspected) exposure to covid-19: Secondary | ICD-10-CM | POA: Diagnosis present

## 2021-10-24 DIAGNOSIS — Z7901 Long term (current) use of anticoagulants: Secondary | ICD-10-CM

## 2021-10-24 DIAGNOSIS — Z955 Presence of coronary angioplasty implant and graft: Secondary | ICD-10-CM

## 2021-10-24 DIAGNOSIS — Z8673 Personal history of transient ischemic attack (TIA), and cerebral infarction without residual deficits: Secondary | ICD-10-CM

## 2021-10-24 DIAGNOSIS — G4700 Insomnia, unspecified: Secondary | ICD-10-CM | POA: Diagnosis present

## 2021-10-24 DIAGNOSIS — F32A Depression, unspecified: Secondary | ICD-10-CM | POA: Diagnosis present

## 2021-10-24 DIAGNOSIS — U099 Post covid-19 condition, unspecified: Secondary | ICD-10-CM | POA: Diagnosis present

## 2021-10-24 DIAGNOSIS — I509 Heart failure, unspecified: Secondary | ICD-10-CM | POA: Diagnosis not present

## 2021-10-24 DIAGNOSIS — Z7982 Long term (current) use of aspirin: Secondary | ICD-10-CM

## 2021-10-24 DIAGNOSIS — I4821 Permanent atrial fibrillation: Secondary | ICD-10-CM | POA: Diagnosis present

## 2021-10-24 DIAGNOSIS — J069 Acute upper respiratory infection, unspecified: Secondary | ICD-10-CM

## 2021-10-24 DIAGNOSIS — Z981 Arthrodesis status: Secondary | ICD-10-CM

## 2021-10-24 DIAGNOSIS — E785 Hyperlipidemia, unspecified: Secondary | ICD-10-CM | POA: Diagnosis present

## 2021-10-24 HISTORY — DX: Unspecified atrial fibrillation: I48.91

## 2021-10-24 LAB — FIBRINOGEN: Fibrinogen: 391 mg/dL (ref 210–475)

## 2021-10-24 LAB — BASIC METABOLIC PANEL
Anion gap: 8 (ref 5–15)
BUN: 9 mg/dL (ref 8–23)
CO2: 27 mmol/L (ref 22–32)
Calcium: 8.8 mg/dL — ABNORMAL LOW (ref 8.9–10.3)
Chloride: 100 mmol/L (ref 98–111)
Creatinine, Ser: 0.76 mg/dL (ref 0.44–1.00)
GFR, Estimated: >60 mL/min (ref >60)
Glucose, Bld: 126 mg/dL — ABNORMAL HIGH (ref 70–99)
Potassium: 3.7 mmol/L (ref 3.5–5.1)
Sodium: 135 mmol/L (ref 135–145)

## 2021-10-24 LAB — TROPONIN I (HIGH SENSITIVITY)
Troponin I (High Sensitivity): 5 ng/L (ref <18)
Troponin I (High Sensitivity): 5 ng/L (ref <18)

## 2021-10-24 LAB — CBC
HCT: 33.8 % — ABNORMAL LOW (ref 36.0–46.0)
Hemoglobin: 10.3 g/dL — ABNORMAL LOW (ref 12.0–15.0)
MCH: 27.5 pg (ref 26.0–34.0)
MCHC: 30.5 g/dL (ref 30.0–36.0)
MCV: 90.4 fL (ref 80.0–100.0)
Platelets: 222 10*3/uL (ref 150–400)
RBC: 3.74 MIL/uL — ABNORMAL LOW (ref 3.87–5.11)
RDW: 16.6 % — ABNORMAL HIGH (ref 11.5–15.5)
WBC: 6 10*3/uL (ref 4.0–10.5)
nRBC: 0 % (ref 0.0–0.2)

## 2021-10-24 LAB — RESP PANEL BY RT-PCR (FLU A&B, COVID) ARPGX2
Influenza A by PCR: NEGATIVE
Influenza B by PCR: NEGATIVE
SARS Coronavirus 2 by RT PCR: POSITIVE — AB

## 2021-10-24 LAB — C-REACTIVE PROTEIN: CRP: 1.2 mg/dL — ABNORMAL HIGH (ref <1.0)

## 2021-10-24 LAB — LACTIC ACID, PLASMA: Lactic Acid, Venous: 0.9 mmol/L (ref 0.5–1.9)

## 2021-10-24 LAB — PROCALCITONIN: Procalcitonin: <0.1 ng/mL

## 2021-10-24 LAB — FERRITIN: Ferritin: 10 ng/mL — ABNORMAL LOW (ref 11–307)

## 2021-10-24 LAB — LACTATE DEHYDROGENASE: LDH: 165 U/L (ref 98–192)

## 2021-10-24 LAB — D-DIMER, QUANTITATIVE: D-Dimer, Quant: 0.5 ug/mL-FEU (ref 0.00–0.50)

## 2021-10-24 LAB — TRIGLYCERIDES: Triglycerides: 81 mg/dL (ref <150)

## 2021-10-24 LAB — BRAIN NATRIURETIC PEPTIDE: B Natriuretic Peptide: 270 pg/mL — ABNORMAL HIGH (ref 0.0–100.0)

## 2021-10-24 MED ORDER — ALBUTEROL SULFATE (2.5 MG/3ML) 0.083% IN NEBU
2.5000 mg | INHALATION_SOLUTION | Freq: Four times a day (QID) | RESPIRATORY_TRACT | 1 refills | Status: DC | PRN
Start: 2021-10-24 — End: 2021-10-24

## 2021-10-24 MED ORDER — PANTOPRAZOLE SODIUM 40 MG PO TBEC
40.0000 mg | DELAYED_RELEASE_TABLET | Freq: Two times a day (BID) | ORAL | Status: DC
  Administered 2021-10-24 – 2021-10-28 (×8): 40 mg via ORAL
  Filled 2021-10-24 (×8): qty 1

## 2021-10-24 MED ORDER — APIXABAN 5 MG PO TABS
5.0000 mg | ORAL_TABLET | Freq: Two times a day (BID) | ORAL | Status: DC
  Administered 2021-10-24 – 2021-10-28 (×8): 5 mg via ORAL
  Filled 2021-10-24 (×8): qty 1

## 2021-10-24 MED ORDER — ASPIRIN EC 81 MG PO TBEC
81.0000 mg | DELAYED_RELEASE_TABLET | Freq: Every day | ORAL | Status: DC
  Administered 2021-10-24 – 2021-10-28 (×5): 81 mg via ORAL
  Filled 2021-10-24 (×5): qty 1

## 2021-10-24 MED ORDER — AMOXICILLIN 500 MG PO CAPS
1000.0000 mg | ORAL_CAPSULE | Freq: Two times a day (BID) | ORAL | Status: AC
  Administered 2021-10-24 – 2021-10-25 (×2): 1000 mg via ORAL
  Filled 2021-10-24 (×2): qty 2

## 2021-10-24 MED ORDER — ACETAMINOPHEN 650 MG RE SUPP: 650.0000 mg | Freq: Four times a day (QID) | RECTAL | Status: DC | PRN

## 2021-10-24 MED ORDER — SODIUM CHLORIDE 0.9 % IV SOLN: 100.0000 mg | Freq: Every day | INTRAVENOUS | Status: DC

## 2021-10-24 MED ORDER — HYDROCODONE-ACETAMINOPHEN 10-325 MG PO TABS
1.0000 | ORAL_TABLET | ORAL | Status: DC | PRN
  Administered 2021-10-24 – 2021-10-27 (×3): 1 via ORAL
  Filled 2021-10-24 (×3): qty 1

## 2021-10-24 MED ORDER — GUAIFENESIN 100 MG/5ML PO LIQD
5.0000 mL | ORAL | Status: DC | PRN
  Administered 2021-10-25 – 2021-10-28 (×3): 5 mL via ORAL
  Filled 2021-10-24 (×3): qty 5

## 2021-10-24 MED ORDER — DEXAMETHASONE SODIUM PHOSPHATE 10 MG/ML IJ SOLN
6.0000 mg | Freq: Once | INTRAMUSCULAR | Status: AC
  Administered 2021-10-24: 6 mg via INTRAVENOUS
  Filled 2021-10-24: qty 1

## 2021-10-24 MED ORDER — SODIUM CHLORIDE 0.9 % IV SOLN
200.0000 mg | Freq: Once | INTRAVENOUS | Status: DC
  Filled 2021-10-24: qty 40

## 2021-10-24 MED ORDER — ACETAMINOPHEN 325 MG PO TABS
650.0000 mg | ORAL_TABLET | Freq: Four times a day (QID) | ORAL | Status: DC | PRN
  Administered 2021-10-25 – 2021-10-26 (×2): 650 mg via ORAL
  Filled 2021-10-24 (×2): qty 2

## 2021-10-24 MED ORDER — FUROSEMIDE 10 MG/ML IJ SOLN
80.0000 mg | Freq: Once | INTRAMUSCULAR | Status: AC
  Administered 2021-10-24: 80 mg via INTRAVENOUS
  Filled 2021-10-24: qty 8

## 2021-10-24 MED ORDER — DILTIAZEM HCL ER COATED BEADS 120 MG PO CP24
120.0000 mg | ORAL_CAPSULE | Freq: Two times a day (BID) | ORAL | Status: DC
  Administered 2021-10-24 – 2021-10-25 (×2): 120 mg via ORAL
  Filled 2021-10-24 (×4): qty 1

## 2021-10-24 MED ORDER — ZOLPIDEM TARTRATE 5 MG PO TABS
5.0000 mg | ORAL_TABLET | Freq: Every evening | ORAL | Status: DC | PRN
  Administered 2021-10-24: 5 mg via ORAL
  Filled 2021-10-24: qty 1

## 2021-10-24 MED ORDER — ROSUVASTATIN CALCIUM 5 MG PO TABS
5.0000 mg | ORAL_TABLET | ORAL | Status: DC
Start: 2021-10-24 — End: 2021-10-24

## 2021-10-24 MED ORDER — AZITHROMYCIN 250 MG PO TABS
250.0000 mg | ORAL_TABLET | Freq: Once | ORAL | Status: AC
  Administered 2021-10-24: 250 mg via ORAL
  Filled 2021-10-24: qty 1

## 2021-10-24 MED ORDER — FLUOXETINE HCL 20 MG PO CAPS
40.0000 mg | ORAL_CAPSULE | Freq: Every day | ORAL | Status: DC
  Administered 2021-10-24 – 2021-10-28 (×5): 40 mg via ORAL
  Filled 2021-10-24 (×5): qty 2

## 2021-10-24 MED ORDER — ROSUVASTATIN CALCIUM 5 MG PO TABS
10.0000 mg | ORAL_TABLET | ORAL | Status: DC
  Administered 2021-10-25 – 2021-10-27 (×2): 10 mg via ORAL
  Filled 2021-10-24 (×4): qty 2

## 2021-10-24 MED ORDER — POTASSIUM CHLORIDE 20 MEQ PO PACK
40.0000 meq | PACK | Freq: Once | ORAL | Status: AC
  Administered 2021-10-24: 40 meq via ORAL
  Filled 2021-10-24: qty 2

## 2021-10-24 MED ORDER — ROSUVASTATIN CALCIUM 5 MG PO TABS
5.0000 mg | ORAL_TABLET | ORAL | Status: DC
  Administered 2021-10-24 – 2021-10-28 (×3): 5 mg via ORAL
  Filled 2021-10-24 (×4): qty 1

## 2021-10-24 MED ORDER — DILTIAZEM HCL ER COATED BEADS 120 MG PO CP24: 120.0000 mg | ORAL_CAPSULE | Freq: Every day | ORAL | Status: DC

## 2021-10-24 NOTE — H&P (Addendum)
Date: 10/24/2021               Patient Name:  [name] [name] MRN: [id number]  DOB: [birth year] Age / Sex: 80 y.o., female   PCP: [name], DO              Medical Service: Internal Medicine Teaching Service              Attending Physician: Dr. Aldine Contes, MD    First Contact: Sabino Dick, MS  Pager: [phone]  Second Contact: Dr. France Ravens Pager: [phone]  Third Contact Dr. Gaylan Gerold  Pager: [phone]       After Hours (After 5p/  First Contact Pager: [phone]  weekends / holidays): Second Contact Pager: [phone]   Chief Complaint: Cough, SOB   History of Present Illness:  Mrs. [name] [name] is a 80 y.o. female with a history of CAD, chronic afib on Elliquis, CHF, MGUS, and dysphagia who presents to the ED due to cough and SOB. She is accompanied by her daughter, [name]. Mrs. Mani reports that she was diagnosed with COVID in late September 2022 and treated with an antiviral, molnupiravir. Although she improved symptomatically, she has a lingering productive cough with thick, yellow sputum. She presented to her PCP on 11/1 to discuss an ear cleanout, but also reported fatigue, cough, and SOB. Flu was neg and CXR was notable for patchy opacities in the LLL with small pleural effusion. She was diagnosed with community acquired pneumonia and given Rocephin 1g IM in the office, and prescribed Guaifenesin prn, Amoxicillin 1000mg  BID, Azithromycin 250mg , and albuterol prn (has not used).   Today, she reports that she feels significantly better than she did on 11/1. She states she is here because she followed up with PCP today, who was worried about her low O2 saturation (never >92%) and tachycardia. She also notes that she has felt soreness in her chest that is worsened by coughing and has noticed worsening leg swelling and leg pain. She states that she always sleeps with the head of her bed slightly elevated. She states that she has no medication changes and reports good  medication adherence, although shares that she does not take her Lasix when she has doctor's appointments since it makes her pee too often, and she has had multiple appointments recently.   She denies worsening cough or SOB, fever, chills, abdominal pain, dysuria, or palpitations. She endorses many chronic issues including dizziness, back pain, dysphagia, urinary incontinence, and diarrhea. She notes that foods other than sweets have not tasted good to her since she was diagnosed with shingles in her throat in 2012, and has difficulty swallowing liquids and solids.   Meds:  Aspirin 81mg  po daily  Vitamin D 2000U po daily  Clobetasol cream  Diltiazem 120mg  po BID  Eliquis 5mg  po BID  Lunesta 3mg  po qhs  Fentanyl 48mcg patch q3 days  Fluoxetine 40mg  po daily  Lasix 80mg  po daily  Protonix 40mg  po daily  KCL 63meQ daily  Rosuvastatin 10mg  po daily   PMH:  Chronic afib on Elliquis CAD - hx PCI w/ DES 12/2014, Myoview 05/2020 neg for ischemia  Combined CHF, most recent ECHO 07/2020 w/ EF 45%  HTN Hiatal hernia  GERD  Dysphagia - upcoming swallow study at end of Nov  MGUS Chronic back pain  Iron deficiency anemia - IV iron infusion scheduled for next Tues OSA - does not use CPAP but is awaiting Inspire  IBS, mixed  Depression  Insomnia  Lichen sclerosis   Surgeries:  Cholecystectomy - 2003 Spine surgery x2 - 2015  BL knee arthroplasty - 1995   Allergies: Diazepam - Hallucinations   Family History:  Family History  Problem Relation Age of Onset   Coronary artery disease Father    Peripheral vascular disease Father    Coronary artery disease Mother    Coronary artery disease Brother    Colon cancer Sister    Emphysema Sister    Sleep apnea Son    Colon cancer Sister        spread to her brain   Emphysema Brother    Dementia Neg Hx    Social History:  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 Lives alone in [place]. Ambulates at home with cane or walker. ADLs  independently, IADLs with support from daughter [name] who lives close by. Both have been under significant stress lately due to [facility] making frequent trips to [facility] to try to diagnose muscle issues that have been present since she had Lyme disease. Stopped driving 2 yrs ago. Has someone who comes 1x month to clean. Denies history of tobacco, illicit substance, and alcohol use.  Review of Systems: A complete ROS was negative except as per HPI.   Physical Exam: Blood pressure (!) 113/58, pulse 64, temperature 97.9 F (36.6 C), resp. rate 20, SpO2 95 %. General: Alert, pleasant, NAD CV: Irregularly irregular rhythm, normal S1 and S2, no murmurs heard. Positive JVP  Pulm: Crackles present at base of left lower lobe. No wheezing or rales heard.   Abd: Soft, mildly distended. Non-tender to palpation. Normal bowel sounds.  MSK: Bilateral LE edema, skin changes on lower extremities that may represent venous stasis dermatitis  Neuro: A&Ox3 Psych: Normal behavior and affect  EKG: personally reviewed my interpretation is afib w/ RVR   CXR: personally reviewed my interpretation is LLL opacity, left sided pleural effusion   ED Course IV Decadron 6mg   Fibrinogen 391 TG 81 LDH 165 D-dimer 0.5 Lactic acid 0.9 WBC 6.0 Hgb 10.3 Procalcitonin and CRP pending   Assessment & Plan by Problem: Mrs. [name] [name] is a 80 y.o. female with a history of CAD, chronic afib on Elliquis, CHF, MGUS, and dysphagia who presents to the ED due to lingering cough and SOB.   Principal Problem:   Acute exacerbation of CHF (congestive heart failure) (HCC) Active Problems:   Permanent atrial fibrillation (HCC)   CAD (coronary artery disease)  #Suspected acute CHF exacerbation  Patient originally diagnosed with CAP by her PCP but is extremely well appearing, has normal WBC of 6, and has remained afebrile with O2 sat of 95% on room air. Given that she has noticed worsening LE edema, SOB, and had significantly elevated  JVP, I suspect acute exacerbation of her combined CHF, potentially due to recent COVID infection, decreased adherence to Lasix, or pneumonia. It is possible she had pneumonia since she noted marked improvement in her cough symptoms since starting the antibiotic course + Robitussin on 11/1 and had WBC of 12.1, although I do not think it is the strongest contributor to her current symptoms. We will give 1 more day of antibiotics for a 4 day course. Plan to diuresis her here and continue to monitor her symptoms. We will also check troponins although ACS seems less likely given the lack of ischemic changes on EKG.  -BNP pending -f/u troponins  -IV Lasix 80mg   -Daily weights  -Strict I/Os -Continue Amox, Azithro for 1 more day  -Guaifenesin  PRN cough  -CBC, BMP tomorrow   #GERD Continue home Protonix 40mg  po daily   #Chronic Afib  Continue home diltiazem 120mg  po BID Continue home Elliquis 5mg  BID   #CAD  Continue home rosuvastatin 10mg  po daily  Continue home aspirin 81mg  po daily   #Depression  Continue home Fluoxetine 40mg  po  Diet: Dysphagia 3 VTE: Elliquis  IVF: Hold at this time  Code: Full   Dispo: Admit patient to Observation with expected length of stay less than 2 midnights.  Signed: Stefani Dama, Medical Student 10/24/2021, 3:58 PM  Pager: [phone] Attestation for Student Documentation:   I personally was present and performed or re-performed the history, physical exam and medical decision-making activities of this service and have verified that the service and findings are accurately documented in the student's note.  France Ravens, MD Pager: [phone]

## 2021-10-24 NOTE — ED Triage Notes (Addendum)
Pt dx with CAP this week, sent from PCP for return check, per note: Call report received from radiologist while pt was in office for reevaluation of pneumonia. Pt has LLL pneumonia as well as a pleural effusion. Pt is feeling slightly better but HR remains elevated, pulse ox low, and lund sounds diminished. Pt will go to ED for evaluation and probable admission.  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  Pt accompanied by daughter. Pt reports she has not been well since end of Sept when she had covid.  Afib on monitor. 90-122. Per daughter pt has taken Amoxicillin and Zithromax but unable to use albuterol inhaler

## 2021-10-24 NOTE — ED Notes (Signed)
Dinner ordered 

## 2021-10-24 NOTE — ED Notes (Signed)
While walking pulse ox dropped to 89% and HR increased to 180s

## 2021-10-24 NOTE — Hospital Course (Addendum)
Patient Summary  Mrs. Ritika Dano is a 80 y.o. female with a history of CAD, chronic afib on Elliquis, CHF, MGUS, and dysphagia who presents to the ED due to lingering cough and SOB. Hospital course is outlined below.   ED Course Patient arrived at the ED complaining of ongoing cough and SOB, although noted it had been improving for the last 3 days since starting antibiotic treatment. She returned to PCP's office on 11/4 and was noted to have O2 sat of 91% on room air with CXR notable for L pleural effusion. She was initially treated for COVID pneumonia, but then it was noted that she had tested positive for COVID in late September and that the positive in the ED was a residual result.   #Acute CHF exacerbation  Patient originally diagnosed with CAP by her PCP but upon initial evaluation was well appearing, has normal WBC of 6, and was afebrile with O2 sat of 95% on room air. Completed 5 day course of amoxicillin and azithromycin during her stay. Given that she has noticed worsening LE edema, SOB, and had significantly elevated JVP, she was diagnosed with acute on chronic exacerbation of her HFmrEF, potentially due to recent COVID infection, decreased adherence to Lasix, afib with RVR, or pneumonia. BNP 270 suggestive of acute CHF exacerbation, and troponins were flat x2, ruling out ACS. Patient was diuresed with daily Lasix 80mg  which she responded well to. Her cough and SOB continued to improve and she did not require any supplemental O2 during her stay. Cardiology recommended an updated Echo, which patient underwent on 11/7. Echo results were notable for ***  #Permanent Afib  Patient's heart rate was noted to increase to the 170s when she walked on 11/5 even after resuming her diltiazem 120mg  BID. Cardiology was consulted in order to assist with management of tachycardia. Patient's diltiazem was increased to 240mg  po qAM and 120mg  po qPM on 11/6. She was continued on Eliquis 5 mg twice daily. She was  ambulated again on 11/6 and her HR ranged from 75-100. Patient was discharged on Diltiazem 240mg  qAM and 120mg  qPM.   #Anemia Patient was scheduled to receive an outpatient IV iron infusion on 11/8 after her PCP ordered an iron panel notable for ferritin of 3. For convenience purposes, she received an IV iron infusion on 11/7 with Venofer, which she tolerated well. At time of discharge, her hemoglobin was 11.2.   #Dysphagia  Patient was placed on dysphagia 3 diet during her stay. Speech therapy was consulted to evaluate patient's dysphagia and recommended regular solids and thin liquids, as well as crushed medication administration with puree. Patient has an upcoming barium swallow study scheduled on 11/29 with Dr. Dareen Piano at [facility].  #GERD Patient was continued on her home dose of Protonix 40 mg p.o. daily.  #CAD  Patient was continued on her home medication of aspirin 81 mg p.o. daily and rosuvastatin 10 mg p.o. daily   #Depression  Patient was continued on her home Fluoxetine 40mg  po daily.   #Insomnia Patient takes Lunesta 3mg  at home to help her sleep, which is not available here. She was offered Ambien but it did not help. On her second night, she was able to take her home Lunesta and slept well. 11 pills from her Johnnye Sima were accidentally dropped and disposed of on 11/5.  Follow-up  Please follow-up with your PCP in 1-2 weeks. Your barium swallow study is scheduled for 11/29 at 10:15 AM. Please also make a follow-up  appointment with your cardiologists, Dr. Burt Knack and Richardson Dopp.

## 2021-10-24 NOTE — ED Provider Notes (Signed)
Emergency Medicine Provider Triage Evaluation Note  [name] [name] , a 80 y.o. female  was evaluated in triage.  Pt complains of pneumonia.  Patient was recently diagnosed pneumonia at her PCP office on Tuesday and given Augmentin, azithromycin, and albuterol.  Patient presented to the office today for reevaluation and the patient had a worsening chest x-ray showing left lower lung consolidation and possible pleural effusion.  Her PCP sent her here for further treatment..  Review of Systems  Positive: Shortness of breath, fatigue, cough, runny nose, nasal congestion Negative: Chest pain, lightheadedness, fatigue, headache, body aches  Physical Exam  BP 101/73 (BP Location: Left Arm)   Pulse 76   Temp 97.9 F (36.6 C)   Resp 18   SpO2 96%  Gen:   Awake, no distress   Resp:  Normal effort decreased lung sounds in the inferior left.  Fine expiratory wheezing throughout. MSK:   Moves extremities without difficulty  Other:  Irregularly irregular heart rate  Medical Decision Making  Medically screening exam initiated at 11:07 AM.  Appropriate orders placed.  [name] [name] was informed that the remainder of the evaluation will be completed by another provider, this initial triage assessment does not replace that evaluation, and the importance of remaining in the ED until their evaluation is complete.  Labs and imaging ordered   Sherrell Puller, Hershal Coria 10/24/21 1109    Daleen Bo, MD 10/25/21 [phone]

## 2021-10-24 NOTE — ED Provider Notes (Addendum)
[name] Provider Note   CSN: [id number] Arrival date & time: 10/24/21  1051     History Chief Complaint  Patient presents with   Pneumonia    [name] is a 80 y.o. female.  The history is provided by the patient, a relative and medical records. No language interpreter was used.  Pneumonia      80 year old female with a PMH of Afib presents to the emergency department for evaluation of penumonia. 4 days ago patient was diagnosed with left lower lobe penumonia and pleural effusion by her primary care provider. She was given amoxicillin and azithromycin for treatment and told to follow up with her pcp if needed. Patient followed up with pcp this morning where her O2 saturation was in the low 90s and patient was tachycardic. Her pcp decided to send patient to ED for further evaluation of pleural effusion and pneumonia. Patient states she feels like the antibiotics have helped improve her productive cough and shortness of breath despite her O2 sat not being at baseline.   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 Abnl nuc 11/2014. Cath 12/2014 - turned down for CABG. Ultimately s/p TTVP, rotational atherectomy, PTCA and stenting of the ostial LCx and left main into the LAD (crush technique), and IVUS of the LAD/Left main. // b. Myoview 11/17: EF 48, poor quality/significant artifact; inf-lateral, inferior ischemia; Intermediate Risk   Chronic atrial fibrillation ([place])    a.  First noted post-op 9/15 spinal fusion.  She had cardioversion, not on anticoagulation. Fall risk, unsteady. // failed DCCV // Holter 10/17: AFib, Avg HR 97, PVCs, no other arrhythmia   Chronic back pain  greater than 3 months duration    a. spinal stenosis.  Spinal fusion with rods in 2/15 at [facility] spinal fusion 9/15.   Chronic diastolic CHF    Echo 5/36:  EF 50-55%, trivial AI, midl MR, mod LAE, PASP 37 mmHg // Echocardiogram 03/2020: EF 45-50, no RWMA, mild LVH, mild reduced RVSF, mildly elevated PASP (RVSP 38.3), mild LAE, mild MR, mild to mod TR, trivial AI // Echo 9/21: EF 45, mild LVH, mildly reduced RV SF, moderate LAE, mild RAE, mild MR, mild AI      Circadian rhythm sleep disorder    CTS (carpal tunnel syndrome)    Deficiency anemia 11/10/2014   Depression    Diarrhea    Diverticulitis of colon    Esophageal stricture    Gastritis    Gastroesophageal hernia 07/06/2013   GERD (gastroesophageal reflux disease)    Hiatal hernia    History of blood transfusion    "most of them related to OR's"    HTN (hypertension)    Hyperlipidemia    IBS (irritable bowel syndrome)    Incontinence 10/13/2012   Insomnia    Ischemic chest pain ([place])    Memory disorder 64/40/3474   Metabolic syndrome 25/95/6387   MGUS (monoclonal gammopathy of unknown significance) dx'd 11/2014   a. Neg BMB 11/2014.   Multiple falls    Obesity    Obstructive sleep apnea    "have mask; don't wear it" (01/07/2015)   Orthostasis  PAT (paroxysmal atrial tachycardia) ([place])    Personal history of colonic polyps 10/25/2011 & 12/02/11   not retrieved Dr Lyla Son & tubular adenomas   Pneumonia 03/2014   Sinus bradycardia    a. Baseline HR 50s-60s.   Stroke [facility]) early 2000's   "small"; denies residual on 01/07/2015)    Patient Active Problem List   Diagnosis Date Noted   Arthritis of right hand 08/28/2021   Acquired trigger finger of right ring finger 00/51/1021   Lichen sclerosus et atrophicus 11/26/2020   Choking    Aspiration into airway 07/16/2020   Acquired trigger finger of right middle finger 07/09/2020   Osteoarthritis of first carpometacarpal joint of left hand 07/09/2020    Osteoarthritis of first carpometacarpal joint of right hand 07/09/2020   Dysphagia 03/25/2020   HOH (hard of hearing) 11/73/5670   Lichen sclerosus et atrophicus of the vulva 01/03/2019   Chronic pain syndrome 02/14/2018   Long-term current use of opiate analgesic 02/14/2018   Anxiety 07/21/2016   Chronic diastolic CHF (congestive heart failure) ([place]) 07/17/2015   Iron deficiency anemia due to chronic blood loss 07/02/2015   DDD (degenerative disc disease), lumbar 03/22/2015   Fall 03/22/2015   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   Difficulty hearing 03/22/2015   Lumbar scoliosis 03/22/2015   History of pelvic fracture 03/22/2015   History of other specified conditions presenting hazards to health 03/22/2015   MGUS (monoclonal gammopathy of unknown significance) 01/08/2015   Permanent atrial fibrillation ([place]) 12/22/2014   CAD (coronary artery disease) 12/22/2014   Memory disorder 12/04/2014   Angulation of spine 14/09/3012   Metabolic syndrome 14/38/8875   S/P spinal fusion 02/23/2014   Chronic pain associated with significant psychosocial dysfunction 02/23/2014   Gastroesophageal hernia 07/06/2013   Incontinence 10/13/2012   Disaccharide malabsorption 11/24/2011   DEGENERATIVE JOINT DISEASE 04/08/2010   Obesity (BMI 30.0-34.9) 04/04/2009   CIRCADIAN RHYTHM SLEEP D/O DELAY SLEEP PHSE TYPE 04/04/2009   CARPAL TUNNEL SYNDROME 04/04/2009   IRRITABLE BOWEL SYNDROME 11/13/2008   ESOPHAGEAL STRICTURE 11/12/2008   GERD 11/12/2008   Paralysis of diaphragm 11/12/2008   DIVERTICULOSIS, COLON 11/12/2008   Obstructive sleep apnea 05/23/2008   Hyperlipidemia 05/22/2008   Depression 05/22/2008   Essential hypertension 05/22/2008   INSOMNIA 05/22/2008    Past Surgical History:  Procedure Laterality Date   BACK SURGERY     BONE MARROW BIOPSY  11/2014   CARDIAC CATHETERIZATION  12/27/2014   Procedure: INTRAVASCULAR PRESSURE WIRE/FFR STUDY;  Surgeon: Larey Dresser, MD;  Location: [facility] CATH  LAB;  Service: Cardiovascular;;   CARDIAC CATHETERIZATION N/A 11/25/2016   Procedure: Left Heart Cath and Coronary Angiography;  Surgeon: Sherren Mocha, MD;  Location: [place] CV LAB;  Service: Cardiovascular;  Laterality: N/A;   CARDIOVERSION N/A 02/13/2016   Procedure: CARDIOVERSION;  Surgeon: Josue Hector, MD;  Location: [facility] ENDOSCOPY;  Service: Cardiovascular;  Laterality: N/A;   CARPAL TUNNEL RELEASE Right 1980's   CATARACT EXTRACTION Bilateral    CATARACT EXTRACTION W/ INTRAOCULAR LENS  IMPLANT, BILATERAL  2000's   CORONARY ANGIOPLASTY WITH STENT PLACEMENT  01/07/2015   "2"   CORONARY STENT PLACEMENT     DILATION AND CURETTAGE OF UTERUS     ESOPHAGOGASTRODUODENOSCOPY (EGD) WITH ESOPHAGEAL DILATION  "several times"   JOINT REPLACEMENT     KNEE ARTHROSCOPY Left 1995   LAPAROSCOPIC CHOLECYSTECTOMY  2003   LEFT HEART CATHETERIZATION WITH CORONARY ANGIOGRAM N/A 12/27/2014   Procedure: LEFT HEART CATHETERIZATION WITH CORONARY ANGIOGRAM;  Surgeon: Larey Dresser,  MD;  Location: [place] CATH LAB;  Service: Cardiovascular;  Laterality: N/A;   PERCUTANEOUS CORONARY ROTOBLATOR INTERVENTION (PCI-R)  01/07/2015   PERCUTANEOUS CORONARY ROTOBLATOR INTERVENTION (PCI-R) N/A 01/07/2015   Procedure: PERCUTANEOUS CORONARY ROTOBLATOR INTERVENTION (PCI-R);  Surgeon: Blane Ohara, MD;  Location: [facility] CATH LAB;  Service: Cardiovascular;  Laterality: N/A;   POSTERIOR FUSION THORACIC SPINE  08/2015   POSTERIOR LUMBAR FUSION  01/2015   TOTAL KNEE ARTHROPLASTY Bilateral 1990's - 2000's     OB History   No obstetric history on file.     Family History  Problem Relation Age of Onset   Coronary artery disease Father    Peripheral vascular disease Father    Coronary artery disease Mother    Coronary artery disease Brother    Colon cancer Sister    Emphysema Sister    Sleep apnea Son    Colon cancer Sister        spread to her brain   Emphysema Brother    Dementia Neg Hx     Social History   Tobacco  Use   Smoking status: Never   Smokeless tobacco: Never  Vaping Use   Vaping Use: Never used  Substance Use Topics   Alcohol use: No    Comment: 01/07/2015 "glass of wine at Christmas, maybe"   Drug use: No    Home Medications Prior to Admission medications   Medication Sig Start Date End Date Taking? Authorizing Provider  amoxicillin (AMOXIL) 400 MG/5ML suspension TAKE 12.5 MLS (1,000 MG TOTAL) BY MOUTH 2 (TWO) TIMES DAILY FOR 10 DAYS. 10/22/21 11/01/21  Baruch Gouty, FNP  aspirin EC 81 MG tablet Take 1 tablet (81 mg total) by mouth daily. 11/23/16   Isaiah Serge, NP  azithromycin (ZITHROMAX Z-PAK) 250 MG tablet As directed 10/21/21   Baruch Gouty, FNP  Cholecalciferol (VITAMIN D) 2000 units tablet Take 2,000 Units by mouth daily.    [provider]  clobetasol cream (TEMOVATE) 0.09 % Apply 1 application topically See admin instructions. Applies to vaginal area twice a week. 09/23/16   [provider]  diltiazem (TIAZAC) 120 MG 24 hr capsule TAKE 1 CAPSULE (120 MG TOTAL) BY MOUTH IN THE MORNING AND AT BEDTIME. Please send refill request to Cardiologist. 09/08/21   [name], DO  diphenoxylate-atropine (LOMOTIL) 2.5-0.025 MG tablet Take 1-2 tablets orally up to 4x per day 11/11/20   [provider]  ELIQUIS 5 MG TABS tablet Take 1 tablet (5 mg total) by mouth 2 (two) times daily. 02/05/21   [name], DO  Eszopiclone 3 MG TABS Take 1 tablet (3 mg total) by mouth at bedtime. For insomnia,take immediately before bedtime. 07/20/19   Dettinger, Fransisca Kaufmann, MD  fentaNYL (DURAGESIC - DOSED MCG/HR) 50 MCG/HR Place 1 patch (50 mcg total) onto the skin every 3 (three) days. 10/31/14   [name], MD  FLUoxetine (PROZAC) 40 MG capsule Take 1 capsule (40 mg total) by mouth daily. 10/14/21   [name], DO  furosemide (LASIX) 80 MG tablet Take 1 tablet (80 mg total) by mouth daily. 06/24/21   [name], DO  guaiFENesin (ROBITUSSIN) 100  MG/5ML liquid Take 5 mLs by mouth every 4 (four) hours as needed for cough or to loosen phlegm. 10/21/21   Baruch Gouty, FNP  HYDROcodone-acetaminophen (NORCO) 10-325 MG per tablet Take 1 tablet by mouth every 4 (four) hours as needed for moderate pain.     [provider]  LINZESS 145 MCG CAPS capsule Take 145 mcg by mouth as needed (constipation). 07/13/21   [provider]  Multiple Vitamin (MULTIVITAMIN) tablet Take 1 tablet by mouth daily. For supplement    [provider]  nitroGLYCERIN (NITROSTAT) 0.4 MG SL tablet Place 1 tablet (0.4 mg total) under the tongue every 5 (five) minutes as needed for chest pain (up to 3 doses). 10/02/19   Richardson Dopp T, PA-C  NONFORMULARY OR COMPOUNDED ITEM Apply 1 application topically daily. Antifungal solution: Terbinafine 3%, Fluconazole 2%, Tea Tree Oil 5%, Urea 10%, Ibuprofen 2% in DMSO suspension #17m    [provider]  pantoprazole (PROTONIX) 40 MG tablet Take 1 tablet (40 mg total) by mouth 2 (two) times daily. 06/09/21   GJanora Norlander DO  potassium chloride (KLOR-CON) 10 MEQ tablet Take 10 mEq by mouth daily.    [provider]  rosuvastatin (CRESTOR) 10 MG tablet Take 5 mg alternating with 10 mg daily for cholesterol 10/08/21   GRonnie DossM, DO  triamcinolone cream (KENALOG) 0.1 % Apply 1 application topically 3 (three) times daily.  06/10/20   [provider]    Allergies    Buprenorphine hcl, Cymbalta [duloxetine hcl], Morphine and related, Oxycodone-acetaminophen, Valium [diazepam], Statins, Morphine, Altace [ramipril], Floxin [ofloxacin], Hydromorphone, Lovaza [omega-3-acid ethyl esters], Trilipix [choline fenofibrate], and Zetia [ezetimibe]  Review of Systems   Review of Systems  All other systems reviewed and are negative.  Physical Exam Updated Vital Signs BP (!) 123/50   Pulse 84   Temp 97.9 F (36.6 C)   Resp 14   SpO2 96%   Physical Exam Vitals and nursing note  reviewed.  Constitutional:      General: She is not in acute distress.    Appearance: She is well-developed. She is obese.  HENT:     Head: Atraumatic.  Eyes:     Conjunctiva/sclera: Conjunctivae normal.  Cardiovascular:     Rate and Rhythm: Tachycardia present. Rhythm irregular.  Pulmonary:     Effort: Pulmonary effort is normal.     Breath sounds: Rhonchi and rales present. No wheezing.  Abdominal:     Palpations: Abdomen is soft.     Tenderness: There is no abdominal tenderness.  Musculoskeletal:     Cervical back: Neck supple.     Right lower leg: Edema present.     Left lower leg: Edema present.     Comments: Trace edema noted to bilateral lower extremities.  Skin:    Findings: No rash.  Neurological:     Mental Status: She is alert. Mental status is at baseline.  Psychiatric:        Mood and Affect: Mood normal.    ED Results / Procedures / Treatments   Labs (all labs ordered are listed, but only abnormal results are displayed) Labs Reviewed  RESP PANEL BY RT-PCR (FLU A&B, COVID) ARPGX2 - Abnormal; Notable for the following components:      Result Value   SARS Coronavirus 2 by RT PCR POSITIVE (*)    All other components within normal limits  CBC - Abnormal; Notable for the following components:   RBC 3.74 (*)    Hemoglobin 10.3 (*)    HCT 33.8 (*)    RDW 16.6 (*)    All other components within normal limits  BASIC METABOLIC PANEL - Abnormal; Notable for the following components:   Glucose, Bld 126 (*)    Calcium 8.8 (*)    All other components within normal  limits  CULTURE, BLOOD (ROUTINE X 2)  CULTURE, BLOOD (ROUTINE X 2)  LACTIC ACID, PLASMA  LACTIC ACID, PLASMA  D-DIMER, QUANTITATIVE  PROCALCITONIN  LACTATE DEHYDROGENASE  FERRITIN  TRIGLYCERIDES  FIBRINOGEN  C-REACTIVE PROTEIN    EKG None ED ECG REPORT   Date: 10/24/2021  Rate: 110  Rhythm: atrial fibrillation  QRS Axis: normal  Intervals: QT prolonged  ST/T Wave abnormalities: normal   Conduction Disutrbances:none  Narrative Interpretation:   Old EKG Reviewed: unchanged  I have personally reviewed the EKG tracing and agree with the computerized printout as noted.   Radiology DG Chest 2 View  Result Date: 10/24/2021 CLINICAL DATA:  PNA EXAM: CHEST - 2 VIEW COMPARISON:  October 21, 2021 FINDINGS: The cardiomediastinal silhouette is unchanged in contour.Atherosclerotic calcifications. Small LEFT pleural effusion. No pneumothorax. Unchanged LEFT basilar opacity. Visualized abdomen is unremarkable. Extensive posterior fixation of the thoracolumbar spine. IMPRESSION: Stable chest radiograph. Electronically Signed   By: Valentino Saxon M.D.   On: 10/24/2021 11:36    Procedures Procedures   Medications Ordered in ED Medications  dexamethasone (DECADRON) injection 6 mg (has no administration in time range)  remdesivir 200 mg in sodium chloride 0.9% 250 mL IVPB (has no administration in time range)    Followed by  remdesivir 100 mg in sodium chloride 0.9 % 100 mL IVPB (has no administration in time range)    ED Course  I have reviewed the triage vital signs and the nursing notes.  Pertinent labs & imaging results that were available during my care of the patient were reviewed by me and considered in my medical decision making (see chart for details).    MDM Rules/Calculators/A&P                           BP (!) 111/56   Pulse 93   Temp 97.9 F (36.6 C)   Resp 13   SpO2 95%   Final Clinical Impression(s) / ED Diagnoses Final diagnoses:  Acute respiratory disease due to COVID-19 virus    Rx / DC Orders ED Discharge Orders     None      1:24 PM Patient here with runny nose, congestion, productive cough and shortness of breath for the past 3 to 4 days.  Was diagnosed with pneumonia by her PCP 3 days ago and was placed on azithromycin and amoxicillin.  She came back to the office for recheck but was noted to have an O2 sats of 91% on room air.  Previous  x-ray also mention left pleural effusion.  Patient was recommended to come to the ED for assessment and possible admission.  On exam patient does have crackles heard to the left lower lung.  When ambulating, O2 sats drops down to 89% on room air.  Does have history of atrial fibrillation with rate fluctuating.  Given presentation, will consider consulting for admission.  Care discussed with Dr. Sherry Ruffing.   1:57 PM Viral respiratory panel shows positive COVID.  Due to her hypoxia and worsening infection, will consult for admission.  I have ordered remdesivir as well as Decadron.  I notified the finding with patient, found out that patient previously tested positive for COVID infection approximately a month ago and then subsequently tested negative.  Not quite sure if this is new COVID or it could be a residual result from previous COVID infection.  However, in the setting of hypoxic state, it would be reasonable to be admitted.  If she has pleural effusion, she may benefit from further assessments of the pleural effusion.  Appreciate consultation from internal medicine resident, Dr. Alfonse Spruce, who agrees to see and will admit patient for further assessment   [name] was evaluated in Emergency Department on 10/24/2021 for the symptoms described in the history of present illness. She was evaluated in the context of the global COVID-19 pandemic, which necessitated consideration that the patient might be at risk for infection with the SARS-CoV-2 virus that causes COVID-19. Institutional protocols and algorithms that pertain to the evaluation of patients at risk for COVID-19 are in a state of rapid change based on information released by regulatory bodies including the CDC and federal and state organizations. These policies and algorithms were followed during the patient's care in the ED.      Domenic Moras, PA-C 10/24/21 1423    Tegeler, Gwenyth Allegra, MD 10/24/21 [phone]

## 2021-10-24 NOTE — Progress Notes (Signed)
Subjective:  Patient ID: [name] [name], female    DOB: Sep 27, 1941, 80 y.o.   MRN: [id number]  Patient Care Team: [name], DO as PCP - General (Family Medicine) Sherren Mocha, MD as PCP - Cardiology (Cardiology) Vickie Epley, MD as PCP - Electrophysiology (Cardiology) Suella Broad, MD (Physical Medicine and Rehabilitation) Hillary Bow, MD (Cardiology) Newton Pigg, MD (Obstetrics and Gynecology) Sharmon Revere as Physician Assistant (Cardiology)   Chief Complaint:  Pneumonia   HPI: [name] [name] is a 80 y.o. female presenting on 10/24/2021 for Pneumonia   Pt presents today for follow up LLL CAP. She was seen in office 3 days ago and diagnosed with CAP, she declined to go to ED. Rocephin 1 gm IM was given in office and pt started on oral amoxicillin and zithromax. Albuterol inhaler for SHOB, cough. She reports she is feeling a little better but not back to normal. Daughter states she feels she is not getting better and that the antibiotics are causing diarrhea.   Pneumonia She complains of cough, difficulty breathing, frequent throat clearing and shortness of breath. There is no chest tightness, hemoptysis, hoarse voice, sputum production or wheezing. This is a new problem. The current episode started in the past 7 days. The problem has been gradually improving. The cough is non-productive. Associated symptoms include appetite change, dyspnea on exertion and malaise/fatigue. Pertinent negatives include no chest pain, ear congestion, ear pain, fever, headaches, heartburn, myalgias, nasal congestion, orthopnea, PND, postnasal drip, rhinorrhea, sneezing, sore throat, sweats, trouble swallowing or weight loss. Her symptoms are aggravated by any activity. Her symptoms are alleviated by rest. She reports minimal improvement on treatment.    Relevant past medical, surgical, family, and social history reviewed and updated as indicated.  Allergies and  medications reviewed and updated. Data reviewed: Chart in Epic.   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 Abnl nuc 11/2014. Cath 12/2014 - turned down for CABG. Ultimately s/p TTVP, rotational atherectomy, PTCA and stenting of the ostial LCx and left main into the LAD (crush technique), and IVUS of the LAD/Left main. // b. Myoview 11/17: EF 48, poor quality/significant artifact; inf-lateral, inferior ischemia; Intermediate Risk   Chronic atrial fibrillation ([place])    a.  First noted post-op 9/15 spinal fusion.  She had cardioversion, not on anticoagulation. Fall risk, unsteady. // failed DCCV // Holter 10/17: AFib, Avg HR 97, PVCs, no other arrhythmia   Chronic back pain greater than 3 months duration    a. spinal stenosis.  Spinal fusion with rods in 2/15 at [facility] spinal fusion 9/15.   Chronic diastolic CHF    Echo 1/19:  EF 50-55%, trivial AI, midl MR, mod LAE, PASP 37 mmHg // Echocardiogram 03/2020: EF 45-50, no RWMA, mild LVH, mild reduced RVSF, mildly elevated PASP (RVSP 38.3), mild LAE, mild MR, mild to mod TR, trivial AI // Echo 9/21: EF 45, mild LVH, mildly reduced RV SF, moderate LAE, mild RAE, mild MR, mild AI      Circadian rhythm sleep disorder    CTS (carpal tunnel syndrome)    Deficiency anemia 11/10/2014   Depression    Diarrhea    Diverticulitis of colon  Esophageal stricture    Gastritis    Gastroesophageal hernia 07/06/2013   GERD (gastroesophageal reflux disease)    Hiatal hernia    History of blood transfusion    "most of them related to OR's"    HTN (hypertension)    Hyperlipidemia    IBS (irritable bowel syndrome)    Incontinence 10/13/2012   Insomnia    Ischemic chest pain ([place])    Memory disorder 38/33/3832    Metabolic syndrome 08/21/9165   MGUS (monoclonal gammopathy of unknown significance) dx'd 11/2014   a. Neg BMB 11/2014.   Multiple falls    Obesity    Obstructive sleep apnea    "have mask; don't wear it" (01/07/2015)   Orthostasis    PAT (paroxysmal atrial tachycardia) ([place])    Personal history of colonic polyps 10/25/2011 & 12/02/11   not retrieved Dr Lyla Son & tubular adenomas   Pneumonia 03/2014   Sinus bradycardia    a. Baseline HR 50s-60s.   Stroke [facility]) early 2000's   "small"; denies residual on 01/07/2015)    Past Surgical History:  Procedure Laterality Date   BACK SURGERY     BONE MARROW BIOPSY  11/2014   CARDIAC CATHETERIZATION  12/27/2014   Procedure: INTRAVASCULAR PRESSURE WIRE/FFR STUDY;  Surgeon: Larey Dresser, MD;  Location: [facility] CATH LAB;  Service: Cardiovascular;;   CARDIAC CATHETERIZATION N/A 11/25/2016   Procedure: Left Heart Cath and Coronary Angiography;  Surgeon: Sherren Mocha, MD;  Location: [place] CV LAB;  Service: Cardiovascular;  Laterality: N/A;   CARDIOVERSION N/A 02/13/2016   Procedure: CARDIOVERSION;  Surgeon: Josue Hector, MD;  Location: [place];  Service: Cardiovascular;  Laterality: N/A;   CARPAL TUNNEL RELEASE Right 1980's   CATARACT EXTRACTION Bilateral    CATARACT EXTRACTION W/ INTRAOCULAR LENS  IMPLANT, BILATERAL  2000's   CORONARY ANGIOPLASTY WITH STENT PLACEMENT  01/07/2015   "2"   CORONARY STENT PLACEMENT     DILATION AND CURETTAGE OF UTERUS     ESOPHAGOGASTRODUODENOSCOPY (EGD) WITH ESOPHAGEAL DILATION  "several times"   JOINT REPLACEMENT     KNEE ARTHROSCOPY Left 1995   LAPAROSCOPIC CHOLECYSTECTOMY  2003   LEFT HEART CATHETERIZATION WITH CORONARY ANGIOGRAM N/A 12/27/2014   Procedure: LEFT HEART CATHETERIZATION WITH CORONARY ANGIOGRAM;  Surgeon: Larey Dresser, MD;  Location: [facility] CATH LAB;  Service: Cardiovascular;  Laterality: N/A;   PERCUTANEOUS CORONARY ROTOBLATOR INTERVENTION (PCI-R)  01/07/2015   PERCUTANEOUS CORONARY  ROTOBLATOR INTERVENTION (PCI-R) N/A 01/07/2015   Procedure: PERCUTANEOUS CORONARY ROTOBLATOR INTERVENTION (PCI-R);  Surgeon: Blane Ohara, MD;  Location: [facility] CATH LAB;  Service: Cardiovascular;  Laterality: N/A;   POSTERIOR FUSION THORACIC SPINE  08/2015   POSTERIOR LUMBAR FUSION  01/2015   TOTAL KNEE ARTHROPLASTY Bilateral 1990's - 2000's    Social History   Socioeconomic History   Marital status: Widowed    Spouse name: Not on file   Number of children: 2   Years of education: HS   Highest education level: Not on file  Occupational History   Occupation: Licensed conveyancer- retired    Comment: retired  Tobacco Use   Smoking status: Never   Smokeless tobacco: Never  Vaping Use   Vaping Use: Never used  Substance and Sexual Activity   Alcohol use: No    Comment: 01/07/2015 "glass of wine at Christmas, maybe"   Drug use: No   Sexual activity: Never  Other Topics Concern   Not on file  Social History Narrative   Patient is right handed  Patient drinks 1-2 sodas daily.   patlient lives alone.   Social Determinants of Health   Financial Resource Strain: Not on file  Food Insecurity: Not on file  Transportation Needs: Not on file  Physical Activity: Not on file  Stress: Not on file  Social Connections: Not on file  Intimate Partner Violence: Not on file    Outpatient Encounter Medications as of 10/24/2021  Medication Sig   amoxicillin (AMOXIL) 400 MG/5ML suspension TAKE 12.5 MLS (1,000 MG TOTAL) BY MOUTH 2 (TWO) TIMES DAILY FOR 10 DAYS.   aspirin EC 81 MG tablet Take 1 tablet (81 mg total) by mouth daily.   azithromycin (ZITHROMAX Z-PAK) 250 MG tablet As directed   Cholecalciferol (VITAMIN D) 2000 units tablet Take 2,000 Units by mouth daily.   clobetasol cream (TEMOVATE) 7.26 % Apply 1 application topically See admin instructions. Applies to vaginal area twice a week.   diltiazem (TIAZAC) 120 MG 24 hr capsule TAKE 1 CAPSULE (120 MG TOTAL) BY MOUTH IN THE MORNING AND AT BEDTIME.  Please send refill request to Cardiologist.   diphenoxylate-atropine (LOMOTIL) 2.5-0.025 MG tablet Take 1-2 tablets orally up to 4x per day   ELIQUIS 5 MG TABS tablet Take 1 tablet (5 mg total) by mouth 2 (two) times daily.   Eszopiclone 3 MG TABS Take 1 tablet (3 mg total) by mouth at bedtime. For insomnia,take immediately before bedtime.   fentaNYL (DURAGESIC - DOSED MCG/HR) 50 MCG/HR Place 1 patch (50 mcg total) onto the skin every 3 (three) days.   FLUoxetine (PROZAC) 40 MG capsule Take 1 capsule (40 mg total) by mouth daily.   furosemide (LASIX) 80 MG tablet Take 1 tablet (80 mg total) by mouth daily.   guaiFENesin (ROBITUSSIN) 100 MG/5ML liquid Take 5 mLs by mouth every 4 (four) hours as needed for cough or to loosen phlegm.   HYDROcodone-acetaminophen (NORCO) 10-325 MG per tablet Take 1 tablet by mouth every 4 (four) hours as needed for moderate pain.    LINZESS 145 MCG CAPS capsule Take 145 mcg by mouth as needed (constipation).   Multiple Vitamin (MULTIVITAMIN) tablet Take 1 tablet by mouth daily. For supplement   nitroGLYCERIN (NITROSTAT) 0.4 MG SL tablet Place 1 tablet (0.4 mg total) under the tongue every 5 (five) minutes as needed for chest pain (up to 3 doses).   NONFORMULARY OR COMPOUNDED ITEM Apply 1 application topically daily. Antifungal solution: Terbinafine 3%, Fluconazole 2%, Tea Tree Oil 5%, Urea 10%, Ibuprofen 2% in DMSO suspension #64m   pantoprazole (PROTONIX) 40 MG tablet Take 1 tablet (40 mg total) by mouth 2 (two) times daily.   potassium chloride (KLOR-CON) 10 MEQ tablet Take 10 mEq by mouth daily.   rosuvastatin (CRESTOR) 10 MG tablet Take 5 mg alternating with 10 mg daily for cholesterol   triamcinolone cream (KENALOG) 0.1 % Apply 1 application topically 3 (three) times daily.    [DISCONTINUED] albuterol (PROVENTIL) (2.5 MG/3ML) 0.083% nebulizer solution Take 3 mLs (2.5 mg total) by nebulization every 6 (six) hours as needed for wheezing or shortness of breath.    [DISCONTINUED] albuterol (VENTOLIN HFA) 108 (90 Base) MCG/ACT inhaler Inhale 2 puffs into the lungs every 6 (six) hours as needed for wheezing or shortness of breath.   No facility-administered encounter medications on file as of 10/24/2021.    Allergies  Allergen Reactions   Buprenorphine Hcl Itching   Cymbalta [Duloxetine Hcl] Other (See Comments)    Other reaction(s): Other (See Comments) Personality changes - crying  2014   Morphine And Related Itching   Oxycodone-Acetaminophen Other (See Comments)    Personality changes    Valium [Diazepam] Other (See Comments)    Personality changes - "in another world" ;  Hallucinations  2015   Statins Other (See Comments)    Other reaction(s): Other (See Comments) Muscle weakness Muscle weakness   Morphine Itching   Altace [Ramipril] Other (See Comments)    unknown   Floxin [Ofloxacin] Other (See Comments)    unknown   Hydromorphone Other (See Comments)    Other reaction(s): Confusion (intolerance) unknown   Lovaza [Omega-3-Acid Ethyl Esters] Other (See Comments)    Muscle weakness    Trilipix [Choline Fenofibrate] Other (See Comments)    Muscle weakness    Zetia [Ezetimibe] Other (See Comments)    Muscle weakness     Review of Systems  Constitutional:  Positive for activity change, appetite change, fatigue and malaise/fatigue. Negative for chills, diaphoresis, fever, unexpected weight change and weight loss.  HENT: Negative.  Negative for ear pain, hoarse voice, postnasal drip, rhinorrhea, sneezing, sore throat and trouble swallowing.   Eyes: Negative.   Respiratory:  Positive for cough and shortness of breath. Negative for hemoptysis, sputum production, chest tightness and wheezing.   Cardiovascular:  Positive for dyspnea on exertion. Negative for chest pain, palpitations, leg swelling and PND.  Gastrointestinal:  Negative for abdominal pain, blood in stool, constipation, diarrhea, heartburn, nausea and vomiting.  Endocrine:  Negative.   Genitourinary:  Negative for dysuria, frequency and urgency.  Musculoskeletal:  Negative for arthralgias and myalgias.  Skin:  Positive for pallor.  Allergic/Immunologic: Negative.   Neurological:  Positive for weakness. Negative for dizziness and headaches.  Hematological: Negative.   Psychiatric/Behavioral:  Positive for confusion (baseline). Negative for hallucinations, sleep disturbance and suicidal ideas.   All other systems reviewed and are negative.      Objective:  BP 102/65   Pulse (!) 113   Temp 97.9 F (36.6 C)   Ht 5' 6"  (1.676 m)   Wt 173 lb (78.5 kg)   SpO2 91%   BMI 27.92 kg/m    Wt Readings from Last 3 Encounters:  10/24/21 173 lb (78.5 kg)  10/21/21 173 lb (78.5 kg)  10/13/21 179 lb 1.6 oz (81.2 kg)    Physical Exam Vitals and nursing note reviewed.  Constitutional:      General: She is not in acute distress.    Appearance: She is ill-appearing. She is not toxic-appearing or diaphoretic.  HENT:     Head: Normocephalic and atraumatic.     Mouth/Throat:     Mouth: Mucous membranes are moist.     Pharynx: Oropharynx is clear.  Eyes:     Conjunctiva/sclera: Conjunctivae normal.     Pupils: Pupils are equal, round, and reactive to light.  Cardiovascular:     Rate and Rhythm: Tachycardia present. Rhythm irregularly irregular.     Heart sounds: Normal heart sounds. No murmur heard. Pulmonary:     Effort: Pulmonary effort is normal.     Breath sounds: Examination of the left-middle field reveals decreased breath sounds. Examination of the right-lower field reveals rhonchi. Examination of the left-lower field reveals decreased breath sounds and rhonchi. Decreased breath sounds and rhonchi present. No wheezing or rales.  Skin:    General: Skin is warm.     Coloration: Skin is pale.  Neurological:     Mental Status: She is alert.    Results for orders placed or performed in visit on  10/21/21  Veritor Flu A/B Waived  Result Value Ref Range    Influenza A Negative Negative   Influenza B Negative Negative  CBC with Differential/Platelet  Result Value Ref Range   WBC 12.1 (H) 3.4 - 10.8 x10E3/uL   RBC 4.14 3.77 - 5.28 x10E6/uL   Hemoglobin 11.2 11.1 - 15.9 g/dL   Hematocrit 36.1 34.0 - 46.6 %   MCV 87 79 - 97 fL   MCH 27.1 26.6 - 33.0 pg   MCHC 31.0 (L) 31.5 - 35.7 g/dL   RDW 15.6 (H) 11.7 - 15.4 %   Platelets 227 150 - 450 x10E3/uL   Neutrophils 89 Not Estab. %   Lymphs 6 Not Estab. %   Monocytes 4 Not Estab. %   Eos 0 Not Estab. %   Basos 0 Not Estab. %   Neutrophils Absolute 10.8 (H) 1.4 - 7.0 x10E3/uL   Lymphocytes Absolute 0.7 0.7 - 3.1 x10E3/uL   Monocytes Absolute 0.5 0.1 - 0.9 x10E3/uL   EOS (ABSOLUTE) 0.0 0.0 - 0.4 x10E3/uL   Basophils Absolute 0.0 0.0 - 0.2 x10E3/uL   Immature Granulocytes 1 Not Estab. %   Immature Grans (Abs) 0.1 0.0 - 0.1 x10E3/uL  BMP8+EGFR  Result Value Ref Range   Glucose 131 (H) 70 - 99 mg/dL   BUN 11 8 - 27 mg/dL   Creatinine, Ser 0.71 0.57 - 1.00 mg/dL   eGFR 85 >59 mL/min/1.73   BUN/Creatinine Ratio 15 12 - 28   Sodium 142 134 - 144 mmol/L   Potassium 4.0 3.5 - 5.2 mmol/L   Chloride 101 96 - 106 mmol/L   CO2 27 20 - 29 mmol/L   Calcium 9.1 8.7 - 10.3 mg/dL       Pertinent labs & imaging results that were available during my care of the patient were reviewed by me and considered in my medical decision making.  Assessment & Plan:  Cierra was seen today for pneumonia.  Diagnoses and all orders for this visit:  Community acquired pneumonia of left lower lobe of lung Pleural effusion on left Call report received from radiologist while pt was in office for reevaluation of pneumonia. Pt has LLL pneumonia as well as a pleural effusion. Pt is feeling slightly better but HR remains elevated, pulse ox low, and lund sounds diminished. Pt will go to ED for evaluation and probable admission.     Continue all other maintenance medications.  Follow up plan: To ED for evaluation and  treatment.    Continue healthy lifestyle choices, including diet (rich in fruits, vegetables, and lean proteins, and low in salt and simple carbohydrates) and exercise (at least 30 minutes of moderate physical activity daily).   The above assessment and management plan was discussed with the patient. The patient verbalized understanding of and has agreed to the management plan. Patient is aware to call the clinic if they develop any new symptoms or if symptoms persist or worsen. Patient is aware when to return to the clinic for a follow-up visit. Patient educated on when it is appropriate to go to the emergency department.   Monia Pouch, FNP-C [facility] [phone]

## 2021-10-25 DIAGNOSIS — Z955 Presence of coronary angioplasty implant and graft: Secondary | ICD-10-CM | POA: Diagnosis not present

## 2021-10-25 DIAGNOSIS — Z79899 Other long term (current) drug therapy: Secondary | ICD-10-CM | POA: Diagnosis not present

## 2021-10-25 DIAGNOSIS — G4700 Insomnia, unspecified: Secondary | ICD-10-CM | POA: Diagnosis present

## 2021-10-25 DIAGNOSIS — Z8673 Personal history of transient ischemic attack (TIA), and cerebral infarction without residual deficits: Secondary | ICD-10-CM | POA: Diagnosis not present

## 2021-10-25 DIAGNOSIS — E785 Hyperlipidemia, unspecified: Secondary | ICD-10-CM | POA: Diagnosis present

## 2021-10-25 DIAGNOSIS — R296 Repeated falls: Secondary | ICD-10-CM | POA: Diagnosis present

## 2021-10-25 DIAGNOSIS — Z888 Allergy status to other drugs, medicaments and biological substances status: Secondary | ICD-10-CM | POA: Diagnosis not present

## 2021-10-25 DIAGNOSIS — I5023 Acute on chronic systolic (congestive) heart failure: Secondary | ICD-10-CM | POA: Diagnosis not present

## 2021-10-25 DIAGNOSIS — K219 Gastro-esophageal reflux disease without esophagitis: Secondary | ICD-10-CM | POA: Diagnosis present

## 2021-10-25 DIAGNOSIS — I5043 Acute on chronic combined systolic (congestive) and diastolic (congestive) heart failure: Secondary | ICD-10-CM | POA: Diagnosis present

## 2021-10-25 DIAGNOSIS — I2510 Atherosclerotic heart disease of native coronary artery without angina pectoris: Secondary | ICD-10-CM | POA: Diagnosis present

## 2021-10-25 DIAGNOSIS — U099 Post covid-19 condition, unspecified: Secondary | ICD-10-CM | POA: Diagnosis present

## 2021-10-25 DIAGNOSIS — K582 Mixed irritable bowel syndrome: Secondary | ICD-10-CM | POA: Diagnosis present

## 2021-10-25 DIAGNOSIS — Z885 Allergy status to narcotic agent status: Secondary | ICD-10-CM | POA: Diagnosis not present

## 2021-10-25 DIAGNOSIS — F32A Depression, unspecified: Secondary | ICD-10-CM | POA: Diagnosis present

## 2021-10-25 DIAGNOSIS — Z20822 Contact with and (suspected) exposure to covid-19: Secondary | ICD-10-CM | POA: Diagnosis present

## 2021-10-25 DIAGNOSIS — G4733 Obstructive sleep apnea (adult) (pediatric): Secondary | ICD-10-CM | POA: Diagnosis present

## 2021-10-25 DIAGNOSIS — I509 Heart failure, unspecified: Secondary | ICD-10-CM | POA: Diagnosis present

## 2021-10-25 DIAGNOSIS — I110 Hypertensive heart disease with heart failure: Secondary | ICD-10-CM | POA: Diagnosis present

## 2021-10-25 DIAGNOSIS — Z7982 Long term (current) use of aspirin: Secondary | ICD-10-CM | POA: Diagnosis not present

## 2021-10-25 DIAGNOSIS — I4819 Other persistent atrial fibrillation: Secondary | ICD-10-CM | POA: Diagnosis not present

## 2021-10-25 DIAGNOSIS — J189 Pneumonia, unspecified organism: Secondary | ICD-10-CM | POA: Diagnosis present

## 2021-10-25 DIAGNOSIS — R1310 Dysphagia, unspecified: Secondary | ICD-10-CM | POA: Diagnosis present

## 2021-10-25 DIAGNOSIS — Z8249 Family history of ischemic heart disease and other diseases of the circulatory system: Secondary | ICD-10-CM | POA: Diagnosis not present

## 2021-10-25 DIAGNOSIS — Z7901 Long term (current) use of anticoagulants: Secondary | ICD-10-CM | POA: Diagnosis not present

## 2021-10-25 DIAGNOSIS — D472 Monoclonal gammopathy: Secondary | ICD-10-CM | POA: Diagnosis present

## 2021-10-25 DIAGNOSIS — I4821 Permanent atrial fibrillation: Secondary | ICD-10-CM | POA: Diagnosis present

## 2021-10-25 LAB — CBC
HCT: 34 % — ABNORMAL LOW (ref 36.0–46.0)
Hemoglobin: 10.8 g/dL — ABNORMAL LOW (ref 12.0–15.0)
MCH: 28.1 pg (ref 26.0–34.0)
MCHC: 31.8 g/dL (ref 30.0–36.0)
MCV: 88.5 fL (ref 80.0–100.0)
Platelets: 207 10*3/uL (ref 150–400)
RBC: 3.84 MIL/uL — ABNORMAL LOW (ref 3.87–5.11)
RDW: 16.2 % — ABNORMAL HIGH (ref 11.5–15.5)
WBC: 3 10*3/uL — ABNORMAL LOW (ref 4.0–10.5)
nRBC: 0 % (ref 0.0–0.2)

## 2021-10-25 LAB — BASIC METABOLIC PANEL
Anion gap: 6 (ref 5–15)
BUN: 8 mg/dL (ref 8–23)
CO2: 29 mmol/L (ref 22–32)
Calcium: 9 mg/dL (ref 8.9–10.3)
Chloride: 100 mmol/L (ref 98–111)
Creatinine, Ser: 0.72 mg/dL (ref 0.44–1.00)
GFR, Estimated: >60 mL/min (ref >60)
Glucose, Bld: 179 mg/dL — ABNORMAL HIGH (ref 70–99)
Potassium: 4 mmol/L (ref 3.5–5.1)
Sodium: 135 mmol/L (ref 135–145)

## 2021-10-25 LAB — MAGNESIUM: Magnesium: 1.9 mg/dL (ref 1.7–2.4)

## 2021-10-25 MED ORDER — FUROSEMIDE 40 MG PO TABS
80.0000 mg | ORAL_TABLET | Freq: Every day | ORAL | Status: DC
  Administered 2021-10-25 – 2021-10-28 (×4): 80 mg via ORAL
  Filled 2021-10-25 (×4): qty 2

## 2021-10-25 MED ORDER — ESZOPICLONE 3 MG PO TABS
3.0000 mg | ORAL_TABLET | Freq: Every day | ORAL | Status: DC
  Administered 2021-10-25 – 2021-10-27 (×3): 3 mg via ORAL
  Filled 2021-10-25: qty 1

## 2021-10-25 MED ORDER — NON FORMULARY: 3.0000 mg | Freq: Every day | Status: DC

## 2021-10-25 MED ORDER — DILTIAZEM HCL 60 MG PO TABS
120.0000 mg | ORAL_TABLET | Freq: Once | ORAL | Status: AC
  Administered 2021-10-25: 120 mg via ORAL
  Filled 2021-10-25: qty 2

## 2021-10-25 MED ORDER — MELATONIN 3 MG PO TABS
3.0000 mg | ORAL_TABLET | Freq: Every evening | ORAL | Status: DC | PRN
  Administered 2021-10-25: 3 mg via ORAL
  Filled 2021-10-25: qty 1

## 2021-10-25 MED ORDER — DILTIAZEM HCL ER COATED BEADS 120 MG PO CP24
120.0000 mg | ORAL_CAPSULE | Freq: Every day | ORAL | Status: DC
  Administered 2021-10-25 – 2021-10-27 (×3): 120 mg via ORAL
  Filled 2021-10-25 (×3): qty 1

## 2021-10-25 MED ORDER — DILTIAZEM HCL 60 MG PO TABS
240.0000 mg | ORAL_TABLET | Freq: Every day | ORAL | Status: DC
  Administered 2021-10-26: 240 mg via ORAL
  Filled 2021-10-25: qty 4

## 2021-10-25 NOTE — Evaluation (Signed)
Physical Therapy Evaluation Patient Details Name: Ambera Fedele Lem MRN: [id number] DOB: January 29, 1941 Today's Date: 10/25/2021  History of Present Illness  Pt is an 80 y.o. female admitted 11/4 for acute CHF exacerbation. She presented to the ED due to cough and SOB. She was diagnosised with COVID in late Sept. 2022. PMH: CAD, chronic afib on [place], CHF, MGUS, and dysphagia   Clinical Impression  Pt admitted with above diagnosis. PTA pt lived at home alone, mod I mobility household distances using rollator. On eval, pt required min assist bed mobility (due to just waking up), supervision transfers, and min guard assist ambulation in room with RW. Unable to assess hallway ambulation due to tachycardia. Toileted and went to sink to brush teeth with HR sustained in 140s and 150s. Max HR 164. SpO2 stable on RA, 92-94%.  Pt will benefit from skilled PT to increase their independence and safety with mobility to allow discharge home.  PT to follow acutely. No follow up services indicated as pt appears to be at/near baseline.        Recommendations for follow up therapy are one component of a multi-disciplinary discharge planning process, led by the attending physician.  Recommendations may be updated based on patient status, additional functional criteria and insurance authorization.  Follow Up Recommendations No PT follow up    Assistance Recommended at Discharge Intermittent Supervision/Assistance  Functional Status Assessment Patient has had a recent decline in their functional status and demonstrates the ability to make significant improvements in function in a reasonable and predictable amount of time.  Equipment Recommendations  None recommended by PT    Recommendations for Other Services       Precautions / Restrictions Precautions Precautions: Fall;Other (comment) Precaution Comments: watch HR      Mobility  Bed Mobility Overal bed mobility: Needs Assistance Bed Mobility: Supine to  Sit     Supine to sit: Min assist;HOB elevated     General bed mobility comments: +rail, increased time, assist needed due to just waking up and pt still groggy    Transfers Overall transfer level: Needs assistance Equipment used: Rolling walker (2 wheels) Transfers: Sit to/from [employer] Transfers Sit to Stand: Supervision Stand pivot transfers: Supervision         General transfer comment: supervision for safety    Ambulation/Gait Ambulation/Gait assistance: Min guard Gait Distance (Feet): 25 Feet Assistive device: Rolling walker (2 wheels) Gait Pattern/deviations: Step-through pattern;Trunk flexed     General Gait Details: distance limited by tachycardia  Stairs            Wheelchair Mobility    Modified Rankin (Stroke Patients Only)       Balance Overall balance assessment: Needs assistance Sitting-balance support: Feet supported;No upper extremity supported Sitting balance-Leahy Scale: Good     Standing balance support: Single extremity supported;Bilateral upper extremity supported Standing balance-Leahy Scale: Fair Standing balance comment: RW for amb                             Pertinent Vitals/Pain Pain Assessment: No/denies pain    Home Living Family/patient expects to be discharged to:: Private residence Living Arrangements: Alone Available Help at Discharge: Family;Available 24 hours/day (daughter and son both live very close) Type of Home: House Home Access: Level entry       Home Layout: One level Home Equipment: Rollator (4 wheels);Rolling Walker (2 wheels);Hand held shower head;Shower seat;Cane - single point  Prior Function Prior Level of Function : Needs assist             Mobility Comments: mod I mobility household distances with rollator ADLs Comments: family assists with iADLs (grocery shopping, driving)     Hand Dominance   Dominant Hand: Right    Extremity/Trunk Assessment   Upper  Extremity Assessment Upper Extremity Assessment: Overall WFL for tasks assessed    Lower Extremity Assessment Lower Extremity Assessment: Overall WFL for tasks assessed    Cervical / Trunk Assessment Cervical / Trunk Assessment: Kyphotic  Communication   Communication: No difficulties  Cognition Arousal/Alertness: Awake/alert Behavior During Therapy: WFL for tasks assessed/performed Overall Cognitive Status: Within Functional Limits for tasks assessed                                          General Comments General comments (skin integrity, edema, etc.): BP 142/84. Resting HR 109. Tachycardia throughout session. Max HR 164. Primarily sustained in 140s and 150s. SpO2 92-94% on RA.    Exercises     Assessment/Plan    PT Assessment Patient needs continued PT services  PT Problem List Decreased mobility;Cardiopulmonary status limiting activity;Decreased activity tolerance;Decreased balance       PT Treatment Interventions Therapeutic activities;Gait training;Therapeutic exercise;Patient/family education;Balance training;Functional mobility training    PT Goals (Current goals can be found in the Care Plan section)  Acute Rehab PT Goals Patient Stated Goal: home PT Goal Formulation: With patient Time For Goal Achievement: 11/08/21 Potential to Achieve Goals: Good    Frequency Min 3X/week   Barriers to discharge        Co-evaluation               AM-PAC PT "6 Clicks" Mobility  Outcome Measure Help needed turning from your back to your side while in a flat bed without using bedrails?: None Help needed moving from lying on your back to sitting on the side of a flat bed without using bedrails?: A Little Help needed moving to and from a bed to a chair (including a wheelchair)?: A Little Help needed standing up from a chair using your arms (e.g., wheelchair or bedside chair)?: A Little Help needed to walk in hospital room?: A Little Help needed  climbing 3-5 steps with a railing? : A Little 6 Click Score: 19    End of Session Equipment Utilized During Treatment: Gait belt Activity Tolerance: Treatment limited secondary to medical complications (Comment) (tachy) Patient left: in chair;with call bell/phone within reach Nurse Communication: Mobility status PT Visit Diagnosis: Difficulty in walking, not elsewhere classified (R26.2)    Time: 1007-1219 PT Time Calculation (min) (ACUTE ONLY): 31 min   Charges:   PT Evaluation $PT Eval Low Complexity: 1 Low PT Treatments $Gait Training: 8-22 mins        Lorrin Goodell, PT  Office # [phone] Pager [phone]   Lorriane Shire 10/25/2021, 9:29 AM

## 2021-10-25 NOTE — Consult Note (Addendum)
Cardiology Consultation:   Patient ID: [name] [name] MRN: [id number]; DOB: 1941-12-13  Admit date: 10/24/2021 Date of Consult: 10/25/2021  PCP:  [name], DO   [facility] Cardiologist:  Sherren Mocha, MD Cardiology APP:  Liliane Shi, PA-C  Electrophysiologist:  Vickie Epley, MD    Patient Profile:   [name] [name] is a 80 y.o. female with a history of CAD with complex bifurcational distal left main/ostial LCx disease (felt to be a poor CABG candidate so underwent PCI/DES to this lesion in 11/2014), chronic diastolic CHF, permanent atrial fibrillation on Eliquis, prior CVA, hypertension with prior orthostatic hypotension and multiple falls in the past, hyperlipidemia, hepatic steatosis, and chronic back pain who is being seen today for the evaluation of fibrillation with RVR at the request of Dr. Dareen Piano.  History of Present Illness:   [name] [name] is a 80 year old female with the above history who is followed by Dr. Burt Knack and Dr. Quentin Ore.  Last cardiac catheterization in 2017 showed patent distal left main/proximal LAD/proximal LCX stent with mild in-stent restenosis of the proximal LCX.  Myoview in 05/2020 was intermediate risk due to reduced systolic function but there was no evidence of ischemia.  Last Echo in 07/2020 showed LVEF of 45% with beat-to-beat variability, mildly reduced RV systolic function, mild MR, and mild AI. Zio monitor in 09/2020 showed 100% atrial fibrillation burden with average heart rate of 92 bpm.  No high-grade heart block or pathological pauses were noted.  Patient was last seen by Richardson Dopp, PA-C, in 07/2021 at which time she was doing well from a cardiac standpoint.  Patient presented to the ED on 10/24/2021 at the advised of her PCP - she was diagnosed with this earlier in the week her PCP but continued to have elevated heart rate, low pulse ox, and diminished lung sounds so was advised to go to the ED for further evaluation.   However, in the ED, there was more concern that this was an acute CHF exacerbation given worsening lower extremity edema and significantly elevated JVP.  She was admitted and started on IV Lasix.  It was noted to have elevated heart rates in the 140s to 160s while ambulating today; therefore Cardiology was consulted for further evaluation.  At the time of this evaluation, patient resting comfortably eating lunch.  She denies any significant palpitations while ambulating to the bathroom this morning.  Her daughter states she had a low little lightheadedness/dizziness yesterday while ambulating but none today.  She has chronic dyspnea on exertion and it has been a little worse since she has been diagnosed with pneumonia.  Patient also feels like she is never recovered after having COVID a couple months ago.  She has stable orthopnea but does describe possible PND lately.  She is chronic lower extremity edema that fluctuates.  It is worse on days when she is on her feet a lot.  She does note some left-sided chest heaviness that radiates down her side into her stomach which sounds like has been ongoing since she was diagnosed with pneumonia.  However, no significant chest pain.  She still has a cough but it is much better compared to earlier this week.  Not as productive.  She does feel like she is breathing better after the dose of IV Lasix yesterday but she states she feels like her chest congestion is coming back.  She has a history of IBS and alternates between having constipation and diarrhea.  She feels that  her GI system has been messed up since being on the antibiotics for the pneumonia.  Of note, she is on both Eliquis and low-dose aspirin.  Her daughter states that she will have bright red blood in her stools after really hard bowel movement.  She states there is actually a good amount of blood with this.  She saw GI earlier this year for this and underwent a colonoscopy.  She did have a couple polyps that  were removed and these were benign.  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 Abnl nuc 11/2014. Cath 12/2014 - turned down for CABG. Ultimately s/p TTVP, rotational atherectomy, PTCA and stenting of the ostial LCx and left main into the LAD (crush technique), and IVUS of the LAD/Left main. // b. Myoview 11/17: EF 48, poor quality/significant artifact; inf-lateral, inferior ischemia; Intermediate Risk   Chronic atrial fibrillation ([place])    a.  First noted post-op 9/15 spinal fusion.  She had cardioversion, not on anticoagulation. Fall risk, unsteady. // failed DCCV // Holter 10/17: AFib, Avg HR 97, PVCs, no other arrhythmia   Chronic back pain greater than 3 months duration    a. spinal stenosis.  Spinal fusion with rods in 2/15 at [facility] spinal fusion 9/15.   Chronic diastolic CHF    Echo 5/92:  EF 50-55%, trivial AI, midl MR, mod LAE, PASP 37 mmHg // Echocardiogram 03/2020: EF 45-50, no RWMA, mild LVH, mild reduced RVSF, mildly elevated PASP (RVSP 38.3), mild LAE, mild MR, mild to mod TR, trivial AI // Echo 9/21: EF 45, mild LVH, mildly reduced RV SF, moderate LAE, mild RAE, mild MR, mild AI      Circadian rhythm sleep disorder    CTS (carpal tunnel syndrome)    Deficiency anemia 11/10/2014   Depression    Diarrhea    Diverticulitis of colon    Esophageal stricture    Gastritis    Gastroesophageal hernia 07/06/2013   GERD (gastroesophageal reflux disease)    Hiatal hernia    History of blood transfusion    "most of them related to OR's"    HTN (hypertension)    Hyperlipidemia    IBS (irritable bowel syndrome)    Incontinence 10/13/2012   Insomnia    Ischemic chest pain ([place])    Memory disorder 92/44/6286    Metabolic syndrome 38/17/7116   MGUS (monoclonal gammopathy of unknown significance) dx'd 11/2014   a. Neg BMB 11/2014.   Multiple falls    Obesity    Obstructive sleep apnea    "have mask; don't wear it" (01/07/2015)   Orthostasis    PAT (paroxysmal atrial tachycardia) ([place])    Personal history of colonic polyps 10/25/2011 & 12/02/11   not retrieved Dr Lyla Son & tubular adenomas   Pneumonia 03/2014   Sinus bradycardia    a. Baseline HR 50s-60s.   Stroke [facility]) early 2000's   "small"; denies residual on 01/07/2015)    Past Surgical History:  Procedure Laterality Date   BACK SURGERY     BONE MARROW BIOPSY  11/2014   CARDIAC CATHETERIZATION  12/27/2014   Procedure: INTRAVASCULAR PRESSURE WIRE/FFR STUDY;  Surgeon: Larey Dresser, MD;  Location[facility]  CATH LAB;  Service: Cardiovascular;;   CARDIAC CATHETERIZATION N/A 11/25/2016   Procedure: Left Heart Cath and Coronary Angiography;  Surgeon: Sherren Mocha, MD;  Location: [facility] CV LAB;  Service: Cardiovascular;  Laterality: N/A;   CARDIOVERSION N/A 02/13/2016   Procedure: CARDIOVERSION;  Surgeon: Josue Hector, MD;  Location: [facility];  Service: Cardiovascular;  Laterality: N/A;   CARPAL TUNNEL RELEASE Right 1980's   CATARACT EXTRACTION Bilateral    CATARACT EXTRACTION W/ INTRAOCULAR LENS  IMPLANT, BILATERAL  2000's   CORONARY ANGIOPLASTY WITH STENT PLACEMENT  01/07/2015   "2"   CORONARY STENT PLACEMENT     DILATION AND CURETTAGE OF UTERUS     ESOPHAGOGASTRODUODENOSCOPY (EGD) WITH ESOPHAGEAL DILATION  "several times"   JOINT REPLACEMENT     KNEE ARTHROSCOPY Left 1995   LAPAROSCOPIC CHOLECYSTECTOMY  2003   LEFT HEART CATHETERIZATION WITH CORONARY ANGIOGRAM N/A 12/27/2014   Procedure: LEFT HEART CATHETERIZATION WITH CORONARY ANGIOGRAM;  Surgeon: Larey Dresser, MD;  Location: [facility] CATH LAB;  Service: Cardiovascular;  Laterality: N/A;   PERCUTANEOUS CORONARY ROTOBLATOR INTERVENTION (PCI-R)  01/07/2015   PERCUTANEOUS CORONARY  ROTOBLATOR INTERVENTION (PCI-R) N/A 01/07/2015   Procedure: PERCUTANEOUS CORONARY ROTOBLATOR INTERVENTION (PCI-R);  Surgeon: Blane Ohara, MD;  Location: [facility] CATH LAB;  Service: Cardiovascular;  Laterality: N/A;   POSTERIOR FUSION THORACIC SPINE  08/2015   POSTERIOR LUMBAR FUSION  01/2015   TOTAL KNEE ARTHROPLASTY Bilateral 1990's - 2000's     Home Medications:  Prior to Admission medications   Medication Sig Start Date End Date Taking? Authorizing Provider  acetaminophen (TYLENOL) 500 MG tablet Take 1,000 mg by mouth every 6 (six) hours as needed for headache.   Yes [provider]  albuterol (VENTOLIN HFA) 108 (90 Base) MCG/ACT inhaler Inhale 2 puffs into the lungs every 6 (six) hours as needed for wheezing or shortness of breath.   Yes [provider]  amoxicillin (AMOXIL) 400 MG/5ML suspension TAKE 12.5 MLS (1,000 MG TOTAL) BY MOUTH 2 (TWO) TIMES DAILY FOR 10 DAYS. 10/22/21 11/01/21 Yes Rakes, Connye Burkitt, FNP  aspirin EC 81 MG tablet Take 1 tablet (81 mg total) by mouth daily. 11/23/16  Yes Isaiah Serge, NP  azithromycin (ZITHROMAX Z-PAK) 250 MG tablet As directed Patient taking differently: Take 250-500 mg by mouth See admin instructions. 521m on day 1, then 2589mdaily for 4 days 10/21/21  Yes Rakes, LiConnye BurkittFNP  Cholecalciferol (VITAMIN D) 2000 units tablet Take 2,000 Units by mouth daily.   Yes [provider]  clobetasol cream (TEMOVATE) 0.0.31 Apply 1 application topically See admin instructions. Applies to vaginal area twice a week. 09/23/16  Yes [provider]  diltiazem (TIAZAC) 120 MG 24 hr capsule TAKE 1 CAPSULE (120 MG TOTAL) BY MOUTH IN THE MORNING AND AT BEDTIME. Please send refill request to Cardiologist. Patient taking differently: Take 120 mg by mouth daily. 09/08/21  Yes Gottschalk, Ashly M, DO  diphenoxylate-atropine (LOMOTIL) 2.5-0.025 MG tablet Take 1-2 tablets by mouth 4 (four) times daily as needed for diarrhea or loose stools. 11/11/20   Yes [provider]  ELIQUIS 5 MG TABS tablet Take 1 tablet (5 mg total) by mouth 2 (two) times daily. 02/05/21  Yes Gottschalk, Ashly M, DO  Eszopiclone 3 MG TABS Take 1 tablet (3 mg total) by mouth at bedtime. For insomnia,take immediately before bedtime. Patient taking differently: Take 3 mg by mouth at bedtime. 07/20/19  Yes Dettinger, JoFransisca KaufmannMD  fentaNYL (DU[place] DOSED MCG/HR)  50 MCG/HR Place 1 patch (50 mcg total) onto the skin every 3 (three) days. 10/31/14  Yes [name], MD  FLUoxetine (PROZAC) 40 MG capsule Take 1 capsule (40 mg total) by mouth daily. 10/14/21  Yes Gottschalk, Leatrice Jewels M, DO  furosemide (LASIX) 80 MG tablet Take 1 tablet (80 mg total) by mouth daily. 06/24/21  Yes Gottschalk, Leatrice Jewels M, DO  guaiFENesin (ROBITUSSIN) 100 MG/5ML liquid Take 5 mLs by mouth every 4 (four) hours as needed for cough or to loosen phlegm. Patient taking differently: Take 100 mLs by mouth every 4 (four) hours as needed for cough or to loosen phlegm. 10/21/21  Yes Rakes, Connye Burkitt, FNP  HYDROcodone-acetaminophen (NORCO) 10-325 MG per tablet Take 1 tablet by mouth every 4 (four) hours as needed for moderate pain.    Yes [provider]  LINZESS 145 MCG CAPS capsule Take 145 mcg by mouth daily as needed (constipation). 07/13/21  Yes [provider]  loperamide (IMODIUM A-D) 2 MG tablet Take 4 mg by mouth 4 (four) times daily as needed for diarrhea or loose stools.   Yes [provider]  Multiple Vitamin (MULTIVITAMIN) tablet Take 1 tablet by mouth daily.   Yes [provider]  nitroGLYCERIN (NITROSTAT) 0.4 MG SL tablet Place 1 tablet (0.4 mg total) under the tongue every 5 (five) minutes as needed for chest pain (up to 3 doses). Patient taking differently: Place 0.4 mg under the tongue every 5 (five) minutes x 3 doses as needed for chest pain. 10/02/19  Yes Weaver, Scott T, PA-C  NONFORMULARY OR COMPOUNDED ITEM Apply 1 application topically daily. Antifungal  solution: Terbinafine 3%, Fluconazole 2%, Tea Tree Oil 5%, Urea 10%, Ibuprofen 2% in DMSO suspension #44m   Yes [provider]  pantoprazole (PROTONIX) 40 MG tablet Take 1 tablet (40 mg total) by mouth 2 (two) times daily. 06/09/21  Yes Gottschalk, ALeatrice JewelsM, DO  potassium chloride (KLOR-CON) 10 MEQ tablet Take 10 mEq by mouth daily.   Yes [provider]  rosuvastatin (CRESTOR) 10 MG tablet Take 5 mg alternating with 10 mg daily for cholesterol Patient taking differently: Take 5-10 mg by mouth See admin instructions. Take 5 mg alternating with 10 mg daily for cholesterol 10/08/21  Yes Gottschalk, Ashly M, DO  triamcinolone cream (KENALOG) 0.1 % Apply 1 application topically 3 (three) times daily as needed (rash). 06/10/20  Yes [provider]    Inpatient Medications: Scheduled Meds:  apixaban  5 mg Oral BID   aspirin EC  81 mg Oral Daily   diltiazem  120 mg Oral QHS   diltiazem  120 mg Oral Once   [START ON 10/26/2021] diltiazem  240 mg Oral Daily   FLUoxetine  40 mg Oral Daily   furosemide  80 mg Oral Daily   pantoprazole  40 mg Oral BID   rosuvastatin  10 mg Oral QODAY   rosuvastatin  5 mg Oral QODAY   Continuous Infusions:  PRN Meds: acetaminophen **OR** acetaminophen, guaiFENesin, HYDROcodone-acetaminophen, melatonin, zolpidem  Allergies:    Allergies  Allergen Reactions   Buprenorphine Hcl Itching   Cymbalta [Duloxetine Hcl] Other (See Comments)    Other reaction(s): Other (See Comments) Personality changes - crying  2014   Morphine And Related Itching   Oxycodone-Acetaminophen Other (See Comments)    Personality changes    Valium [Diazepam] Other (See Comments)    Personality changes - "in another world" ;  Hallucinations  2015   Statins Other (See Comments)  Other reaction(s): Other (See Comments) Muscle weakness Muscle weakness   Morphine Itching   Altace [Ramipril] Other (See Comments)    unknown   Floxin [Ofloxacin] Other (See  Comments)    unknown   Hydromorphone Other (See Comments)    Other reaction(s): Confusion (intolerance) unknown   Lovaza [Omega-3-Acid Ethyl Esters] Other (See Comments)    Muscle weakness    Trilipix [Choline Fenofibrate] Other (See Comments)    Muscle weakness    Zetia [Ezetimibe] Other (See Comments)    Muscle weakness     Social History:   Social History   Socioeconomic History   Marital status: Widowed    Spouse name: Not on file   Number of children: 2   Years of education: HS   Highest education level: Not on file  Occupational History   Occupation: Licensed conveyancer- retired    Comment: retired  Tobacco Use   Smoking status: Never   Smokeless tobacco: Never  Vaping Use   Vaping Use: Never used  Substance and Sexual Activity   Alcohol use: No    Comment: 01/07/2015 "glass of wine at Christmas, maybe"   Drug use: No   Sexual activity: Never  Other Topics Concern   Not on file  Social History Narrative   Patient is right handed   Patient drinks 1-2 sodas daily.   patlient lives alone.   Social Determinants of Health   Financial Resource Strain: Not on file  Food Insecurity: Not on file  Transportation Needs: Not on file  Physical Activity: Not on file  Stress: Not on file  Social Connections: Not on file  Intimate Partner Violence: Not on file    Family History:   Family History  Problem Relation Age of Onset   Coronary artery disease Father    Peripheral vascular disease Father    Coronary artery disease Mother    Coronary artery disease Brother    Colon cancer Sister    Emphysema Sister    Sleep apnea Son    Colon cancer Sister        spread to her brain   Emphysema Brother    Dementia Neg Hx      ROS:  Please see the history of present illness.  All other ROS reviewed and negative.     Physical Exam/Data:   Vitals:   10/25/21 0800 10/25/21 0929 10/25/21 0951 10/25/21 1200  BP: (!) 142/84 (!) 142/84 (!) 142/84 110/84  Pulse: (!) 110  (!) 110  (!) 108  Resp: 19  19   Temp: 97.7 F (36.5 C)  97.7 F (36.5 C) 97.7 F (36.5 C)  TempSrc: Oral  Oral Oral  SpO2: 94%   97%  Weight:   80.7 kg   Height:   5' 6" (1.676 m)     Intake/Output Summary (Last 24 hours) at 10/25/2021 1326 Last data filed at 10/24/2021 2253 Gross per 24 hour  Intake --  Output 750 ml  Net -750 ml   Last 3 Weights 10/25/2021 10/25/2021 10/24/2021  Weight (lbs) 177 lb 14.6 oz 177 lb 14.6 oz 173 lb  Weight (kg) 80.7 kg 80.7 kg 78.472 kg     Body mass index is 28.72 kg/m.  General: 80 y.o. Caucasian female resting comfortably in no acute distress. HEENT: Normocephalic and atraumatic. Sclera clear.  Neck: Supple. No JVD. Heart: Irregular irregular rhythm with mild tachycardia. No murmurs, gallops, or rubs.  Lungs: No increased work of breathing. Decreased breath sounds in bilateral bases. Abdomen:  Soft, non-distended, and non-tender to palpation.  Extremities: Mild lower extremity edema bilaterally. Skin: Warm and dry. Neuro: Alert and oriented x3. No focal deficits. Psych: Normal affect. Responds appropriately.   EKG:  The EKG was personally reviewed and demonstrates:  Atrial fibrillation, rate 121 bpm, with no acute ischemic changes. Telemetry:  Telemetry was personally reviewed and demonstrates: Atrial fibrillation with rates in the 90s to 110s at rest and as high as the 140 to 160s earlier this morning.  Relevant CV Studies:  Left Cardiac Catheterization 11/25/2016: Continued patency of the distal left mainstem/proximal LAD/proximal circumflex bifurcation stents with mild ISR of the proximal circumflex   Mild calcific, nonobstructive CAD   Recommend: continued medical therapy for CAD, CHF, and atrial fibrillation _______________  Myoview 05/29/2020: Nuclear stress EF: 44%. There was no ST segment deviation noted during stress. The study is normal. The left ventricular ejection fraction is moderately decreased (30-44%). This is an intermediate  risk study due to reduced systolic function. There is no ischemia. _______________  [facility] Reach Monitor 09/2020: The basic rhythm is atrial fibrillation with an average HR of 92 bpm.  Atrial fibrillation is present throughout with no sinus rhythm. No high-grade heart block or pathologic pauses There are occasional PVC's   Laboratory Data:  High Sensitivity Troponin:   Recent Labs  Lab 10/24/21 1628 10/24/21 1835  TROPONINIHS 5 5     Chemistry Recent Labs  Lab 10/21/21 1621 10/24/21 1119 10/25/21 0139  NA 142 135 135  K 4.0 3.7 4.0  CL 101 100 100  CO2 _0 GLUCOSE 131* 126* 179*  BUN _1 CREATININE 0.71 0.76 0.72  CALCIUM 9.1 8.8* 9.0  GFRNONAA  --  >60 >60  ANIONGAP  --  8 6    No results for input(s): PROT, ALBUMIN, AST, ALT, ALKPHOS, BILITOT in the last 168 hours. Lipids  Recent Labs  Lab 10/24/21 1420  TRIG 81    Hematology Recent Labs  Lab 10/21/21 1621 10/24/21 1119 10/25/21 0139  WBC 12.1* 6.0 3.0*  RBC 4.14 3.74* 3.84*  HGB 11.2 10.3* 10.8*  HCT 36.1 33.8* 34.0*  MCV 87 90.4 88.5  MCH 27.1 27.5 28.1  MCHC 31.0* 30.5 31.8  RDW 15.6* 16.6* 16.2*  PLT 227 222 207   Thyroid No results for input(s): TSH, FREET4 in the last 168 hours.  BNP Recent Labs  Lab 10/24/21 1628  BNP 270.0*    DDimer  Recent Labs  Lab 10/24/21 1420  DDIMER 0.50     Radiology/Studies:  DG Chest 2 View  Result Date: 10/24/2021 CLINICAL DATA:  PNA EXAM: CHEST - 2 VIEW COMPARISON:  October 21, 2021 FINDINGS: The cardiomediastinal silhouette is unchanged in contour.Atherosclerotic calcifications. Small LEFT pleural effusion. No pneumothorax. Unchanged LEFT basilar opacity. Visualized abdomen is unremarkable. Extensive posterior fixation of the thoracolumbar spine. IMPRESSION: Stable chest radiograph. Electronically Signed   By: Valentino Saxon M.D.   On: 10/24/2021 11:36   DG Chest 2 View  Result Date: 10/24/2021 CLINICAL DATA:  Shortness of breath. EXAM: CHEST -  2 VIEW COMPARISON:  X-ray chest 09/08/2020. FINDINGS: Unchanged borderline cardiomegaly. Normal pulmonary vascularity. New patchy opacities in the left lower lobe with small left pleural effusion. The right lung is clear. No pneumothorax. No acute osseous abnormality. Extensive thoracolumbar fusion hardware again noted. IMPRESSION: 1. Left lower lobe pneumonia with small left pleural effusion. These results will be called to the ordering clinician or representative by the Radiologist Assistant, and communication documented in  the PACS or [employer]. Electronically Signed   By: Titus Dubin M.D.   On: 10/24/2021 09:15     Assessment and Plan:   Permanent Atrial Fibrillation -Patient was admitted for acute diastolic CHF and recent community-acquired pneumonia.  She has known permanent atrial fibrillation and rates have been elevated throughout admission.  Rates as high as the 140s to 160s with ambulating. Average heart rates were in the 90s on outpatient monitor last year so I wonder if her rates always get this high with ambulation. -Rates currently in the 90s to 110s while at rest. - Potassium 4.0.  - Will check Magnesium and TSH.  -Currently on home Diltiazem 11m twice daily. Will increase to 2444min the morning and 12058mn the evening. She has already received her morning dose of this so will give another 120m95mw. Will repeat Echo to reassess EF. If EF is down, will need to get her off Diltiazem. -Intolerant to beta-blockers and digoxin in the past. -Continue Eliquis 5 mg twice daily.  Acute on Chronic Diastolic CHF - BNP mildly elevated at 270.  - Chest x-ray small left pleural effusion and unchanged left basilar opacity (stable from imaging on 10/21/2021). -Last Echo in 07/2020 showed LVEF of 45% with beat-to-beat variability, mildly reduced RV systolic function, mild MR, and mild AI. -Received one dose of IV Lasix last night with some mild improvement. Only 750mL11mdocumented  output but do not know if this is accurate. -Do not appear significant volume overloaded on exam. -Patient has been restarted on home PO Lasix 80mg 28my. Agree with this. -Will repeat Echo to reassess EF. If EF is dow, will need to get her off Diltiazem. - Continue to monitor daily weights, strict I/Os, and renal function.  CAD -LHC in 2015 showedcomplex bifurcational distal left main/ostial LCx disease. She was felt to be a poor CABG candidate so underwent PCI/DES to this lesion.  Last cath in 2017 showed patent stent.  Myoview in 05/2020 showed no ischemia. -No angina. -Continue aspirin and statin.  Hypertension - BP well controlled. - Continue Diltiazem as above.  Hyperlipidemia -LDL 47 in 07/2021. -Continue Crestor (alternates taking 5mg an15m0mg da44m.  Community Acquired Pneumonia -Continue antibiotics per primary team.  Risk Assessment/Risk Scores:    New York Heart Association (NYHA) Functional Class NYHA Class III  CHA2DS2-VASc Score = 5  This indicates a 7.2% annual risk of stroke. The patient's score is based upon: CHF History: 1 HTN History: 0 Diabetes History: 0 Stroke History: 0 Vascular Disease History: 1 Age Score: 2 Gender Score: 1   For questions or updates, please contact [facility] [email or web address] for contact info under    Signed, Callie EDarreld Mclean11/04/2021 1:26 PM   I have seen, examined the patient, and reviewed the above assessment and plan.  Changes to above are made where necessary.  On exam, iRRR.  The patient has longstanding persistent afib.  She has done mostly well with rate control.  We discussed options of ongoing rate control vs rhythm control with AAD (tikosyn/ amiodarone) at length.  She and her daughter are clear that they prefer rate control going forward. We will increase diltiazem today. I suspect that dietary sodium nondiscretion is part of her difficulty with acute on chronic diastolic dysfunction.  I  agree with gentle diuresis.  Cardiology to follow.  Co Sign:  AlThompson Grayer5/2022 2:38 PM

## 2021-10-25 NOTE — Progress Notes (Signed)
Date: 10/25/2021  Patient name: [name] [name]  Medical record number: [id number]  Date of birth: [birth year]   I have seen and evaluated [name] [name] and discussed their care with the Residency Team.  In brief, patient is an 80 year old female with a past medical history of CAD, chronic A. fib on Eliquis, heart failure with mildly reduced EF, MGUS and dysphagia who presented to the ED with hypoxia and new left pleural effusion from her PCP office.  Patient initially presented to her PCP on November 1 with cough, fatigue and shortness of breath and had a chest x-ray done at that time which showed left lower lobe opacity as well as small left pleural effusion.  Patient was diagnosed with community-acquired pneumonia and given 1 g of Rocephin in the office as well as a 7-day course of amoxicillin and azithromycin.  Patient improved symptomatically and went for follow-up on November 4 to her PCP and it was noted that she was tachycardic as well as had low O2 sats and was referred to the ED for further evaluation.  Patient did have persistent productive cough with thick yellow sputum.  She also noticed worsening lower extremity swelling and pain.  She may have missed some Lasix doses as well at home as she does not like to take this prior to her doctors appointments.  Patient denies any chest pain, no palpitations, no lightheadedness, no syncope, no focal weakness, no tingling or numbness, no headache, no blurry vision, no abdominal pain, no nausea or vomiting.  Today, patient was up walking with PT when we arrived at the room.  She was noted to have elevated heart rates in the 140s to 150s when she is ambulating.  She denied any palpitations associated with this.  PMHx, Fam Hx, and/or Soc Hx : As per resident admit note    Vitals:   10/25/21 0929 10/25/21 0951  BP: (!) 142/84 (!) 142/84  Pulse:  (!) 110  Resp:  19  Temp:  97.7 F (36.5 C)  SpO2:     General: Awake, alert, oriented x3,  NAD CVS: Tachycardic, irregular Lungs: CTA bilaterally Abdomen: Soft, nontender, nondistended, normoactive bowel sounds Extremities: Trace bilateral lower extremity pitting edema noted on exam, mild tenderness to palpation bilaterally HEENT: Normocephalic, atraumatic Psych: Normal mood and affect Skin: Warm and dry  Assessment and Plan: I have seen and evaluated the patient as outlined above. I agree with the formulated Assessment and Plan as detailed in the residents' note, with the following changes:   1.  Acute on chronic CHF (heart failure with mild reduced ejection fraction) exacerbation: -Patient initially presented to her PCP with cough and shortness of breath and was diagnosed with community-acquired pneumonia and treated as an outpatient.  Patient symptoms improved significantly but she was noted to be hypoxic and tachycardic at her PCPs office and was referred to the ED for further evaluation. -Patient has completed her course of antibiotics and her leukocytosis is resolved -She was noted to have bilateral lower extremity edema on initial exam and JVD consistent with an acute on chronic heart failure exacerbation.  She was started on IV Lasix and was net negative approximately 750 cc.  Today, her lower extremity edema has improved and that she does not appear to be fluid overloaded on exam. -On ambulation today her O2 sats remained in the 90s on room air -However, she was noted to be in A. fib with RVR on ambulation with heart rates in the 140s to 150s.  She had not yet received her a.m. Cardizem dose.  We will give her her a.m. Cardizem dose and recheck her later this afternoon to see if she remains tachycardic.  If she does remain tachycardic will likely need up titration of her Cardizem and possible cardiology consult if no improvement -Continue with Eliquis for anticoagulation for A. fib -It is possible that the patient's A. fib with RVR led to her heart failure exacerbation.  Will  need better rate control prior to DC -Continue strict I's and O's -EKG showed A. fib with RVR but no ischemic changes -No further work-up at this time.  We will continue to monitor closely  Aldine Contes, MD 11/5/202210:49 AM

## 2021-10-25 NOTE — Evaluation (Signed)
Occupational Therapy Evaluation Patient Details Name: [name] [name] MRN: [id number] DOB: November 25, 1941 Today's Date: 10/25/2021   History of Present Illness Pt is an 80 y.o. female admitted 11/4 for acute CHF exacerbation. She presented to the ED due to cough and SOB. She was diagnosised with COVID in late Sept. 2022. PMH: CAD, chronic afib on Elliquis, CHF, MGUS, and dysphagia   Clinical Impression   Pt presents with pain and decreased activity tolerance and balance. Pt currently requiring Min A for LB ADLs and supervision - Min guard with functional transfers/mobility. Pt reportedly modified independent at baseline and lives alone with family nearby available to provide assistance as needed at home. Pt tachycardic with activity (max HR 172 bpm during eval). Nsg aware and MD notified. Educated on compensatory strategies for ADLs. Pt should be safe to return home without ay further skilled OT services once medically cleared. Will follow acutely to maximize safety/independence with ADLs and functional transfers/nobility prior to return home.     Recommendations for follow up therapy are one component of a multi-disciplinary discharge planning process, led by the attending physician.  Recommendations may be updated based on patient status, additional functional criteria and insurance authorization.   Follow Up Recommendations  No OT follow up    Assistance Recommended at Discharge Intermittent Supervision/Assistance  Functional Status Assessment  Patient has had a recent decline in their functional status and demonstrates the ability to make significant improvements in function in a reasonable and predictable amount of time.  Equipment Recommendations  None recommended by OT    Recommendations for Other Services       Precautions / Restrictions Precautions Precautions: Fall;Other (comment) Precaution Comments: watch HR Restrictions Weight Bearing Restrictions: No      Mobility Bed  Mobility      General bed mobility comments: In chair on arrival    Transfers Overall transfer level: Needs assistance Equipment used: Rolling walker (2 wheels) Transfers: Sit to/from Stand Sit to Stand: Supervision          General transfer comment: supervision for safety      Balance Overall balance assessment: Needs assistance Sitting-balance support: Feet supported;No upper extremity supported Sitting balance-Leahy Scale: Good     Standing balance support: Single extremity supported;Bilateral upper extremity supported Standing balance-Leahy Scale: Fair Standing balance comment: Able to let go of walker with both hands to pull underwear over hips                           ADL either performed or assessed with clinical judgement   ADL Overall ADL's : Needs assistance/impaired Eating/Feeding: Independent   Grooming: Supervision/safety;Standing   Upper Body Bathing: Independent;Sitting   Lower Body Bathing: Minimal assistance;Sitting/lateral leans   Upper Body Dressing : Set up;Sitting   Lower Body Dressing: Minimal assistance   Toilet Transfer: Min guard;Ambulation;Regular Toilet;Rolling walker (2 wheels);Grab bars   Toileting- Clothing Manipulation and Hygiene: Supervision/safety       Functional mobility during ADLs: Min guard;Rolling walker (2 wheels) General ADL Comments: Pt limited by pain,balance, and activity tolerance.     Vision   Vision Assessment?: No apparent visual deficits     Perception     Praxis      Pertinent Vitals/Pain Pain Assessment: 0-10 Pain Score: 6  Pain Location: L side Pain Descriptors / Indicators: Aching;Grimacing;Guarding Pain Intervention(s): Limited activity within patient's tolerance;Monitored during session;Repositioned     Hand Dominance Right   Extremity/Trunk Assessment Upper Extremity Assessment  Upper Extremity Assessment: Overall WFL for tasks assessed   Lower Extremity Assessment Lower  Extremity Assessment: Defer to PT evaluation   Cervical / Trunk Assessment Cervical / Trunk Assessment: Kyphotic   Communication Communication Communication: No difficulties   Cognition Arousal/Alertness: Awake/alert Behavior During Therapy: WFL for tasks assessed/performed Overall Cognitive Status: Within Functional Limits for tasks assessed                                       General Comments  HR 118 bpm at rest. Up to 172 bpm with moblity. Nsg aware and MD notified.    Exercises     Shoulder Instructions      Home Living Family/patient expects to be discharged to:: Private residence Living Arrangements: Alone Available Help at Discharge: Family;Available 24 hours/day Type of Home: House Home Access: Level entry     Home Layout: One level     Bathroom Shower/Tub: Occupational psychologist: Handicapped height     Home Equipment: Rollator (4 wheels);Rolling Walker (2 wheels);Hand held shower head;Shower seat;Cane - single point          Prior Functioning/Environment Prior Level of Function : Needs assist             Mobility Comments: mod I mobility household distances with rollator ADLs Comments: family assists with iADLs (grocery shopping, driving)        OT Problem List: Decreased activity tolerance;Impaired balance (sitting and/or standing);Decreased safety awareness;Decreased knowledge of use of DME or AE;Decreased knowledge of precautions;Cardiopulmonary status limiting activity;Pain      OT Treatment/Interventions: Self-care/ADL training;Therapeutic exercise;Neuromuscular education;Energy conservation;DME and/or AE instruction;Therapeutic activities;Patient/family education;Balance training    OT Goals(Current goals can be found in the care plan section) Acute Rehab OT Goals Patient Stated Goal: return home OT Goal Formulation: With patient/family Time For Goal Achievement: 11/08/21 Potential to Achieve Goals: Good  OT  Frequency: Min 2X/week   Barriers to D/C:            Co-evaluation              AM-PAC OT "6 Clicks" Daily Activity     Outcome Measure Help from another person eating meals?: None Help from another person taking care of personal grooming?: A Little Help from another person toileting, which includes using toliet, bedpan, or urinal?: A Little Help from another person bathing (including washing, rinsing, drying)?: A Little Help from another person to put on and taking off regular upper body clothing?: A Little Help from another person to put on and taking off regular lower body clothing?: A Little 6 Click Score: 19   End of Session Equipment Utilized During Treatment: Gait belt;Rolling walker (2 wheels) Nurse Communication: Mobility status  Activity Tolerance: Patient limited by fatigue;Patient limited by pain Patient left: in chair;with call bell/phone within reach;with nursing/sitter in room;with family/visitor present  OT Visit Diagnosis: Unsteadiness on feet (R26.81);Other abnormalities of gait and mobility (R26.89);Pain Pain - Right/Left: Left                Time: 1025-8527 OT Time Calculation (min): 29 min Charges:  OT General Charges $OT Visit: 1 Visit OT Evaluation $OT Eval Low Complexity: 1 Low  Aseem Sessums C, OT/L  Acute Rehab [place] 10/25/2021, 11:42 AM

## 2021-10-25 NOTE — Progress Notes (Addendum)
HD#0 Subjective:  Overnight Events: Patient reported to not have Lunesta which led to insomnia.  Patient was provided with Ambien but to no beneficial effect.  Patient was seen and assessed this morning. Patient states she had no shortness of breath while ambulating around.  However, patient did endorse that she had feelings that her heart was racing.  Patient states she has occasional chest palpitations at home with exertion.  Patient's primary complaint was that she did not sleep well and tachycardia.  Discussed with patient that she would be able to have her daughter bring her Johnnye Sima to her in order for her to sleep.  Patient verbalized agreement.  Discussed plan with patient today to get her heart rate better controlled.  Of note, patient had not received her diltiazem yet this morning prior to being walked by PT.  Plan to have nurse walk patient again in the afternoon and assess heart rate.  Patient verbalized agreement and appreciation for assistance.  Objective:  Vital signs in last 24 hours: Vitals:   10/25/21 0436 10/25/21 0800 10/25/21 0929 10/25/21 0951  BP: 103/72 (!) 142/84 (!) 142/84 (!) 142/84  Pulse: (!) 104 (!) 110  (!) 110  Resp: 17 19  19   Temp: 97.7 F (36.5 C) 97.7 F (36.5 C)  97.7 F (36.5 C)  TempSrc: Oral Oral  Oral  SpO2: 95% 94%    Weight: 80.7 kg   80.7 kg  Height:    5\' 6"  (1.676 m)   Supplemental O2: Room Air SpO2: 94 %  CBC    Component Value Date/Time   WBC 3.0 (L) 10/25/2021 0139   RBC 3.84 (L) 10/25/2021 0139   HGB 10.8 (L) 10/25/2021 0139   HGB 11.2 10/21/2021 1621   HGB 13.0 07/22/2017 1445   HCT 34.0 (L) 10/25/2021 0139   HCT 36.1 10/21/2021 1621   HCT 39.6 07/22/2017 1445   PLT 207 10/25/2021 0139   PLT 227 10/21/2021 1621   MCV 88.5 10/25/2021 0139   MCV 87 10/21/2021 1621   MCV 91.7 07/22/2017 1445   MCH 28.1 10/25/2021 0139   MCHC 31.8 10/25/2021 0139   RDW 16.2 (H) 10/25/2021 0139   RDW 15.6 (H) 10/21/2021 1621   RDW  14.3 07/22/2017 1445   LYMPHSABS 0.7 10/21/2021 1621   LYMPHSABS 1.6 07/22/2017 1445   MONOABS 0.4 10/13/2021 1348   MONOABS 0.5 07/22/2017 1445   EOSABS 0.0 10/21/2021 1621   BASOSABS 0.0 10/21/2021 1621   BASOSABS 0.0 07/22/2017 1445   CMP Latest Ref Rng & Units 10/25/2021 10/24/2021 10/21/2021  Glucose 70 - 99 mg/dL 179(H) 126(H) 131(H)  BUN 8 - 23 mg/dL 8 9 11   Creatinine 0.44 - 1.00 mg/dL 0.72 0.76 0.71  Sodium 135 - 145 mmol/L 135 135 142  Potassium 3.5 - 5.1 mmol/L 4.0 3.7 4.0  Chloride 98 - 111 mmol/L 100 100 101  CO2 22 - 32 mmol/L 29 27 27   Calcium 8.9 - 10.3 mg/dL 9.0 8.8(L) 9.1  Total Protein 6.5 - 8.1 g/dL - - -  Total Bilirubin 0.3 - 1.2 mg/dL - - -  Alkaline Phos 38 - 126 U/L - - -  AST 15 - 41 U/L - - -  ALT 0 - 44 U/L - - -    Physical Exam:  General: Alert, pleasant, NAD CV: Irregularly irregular rhythm, normal S1 and S2, no murmurs heard. Positive JVP  Pulm: Crackles present at base of left lower lobe. No wheezing or rales heard.  Abd: Soft, mildly distended. Non-tender to palpation. Normal bowel sounds.  MSK: Bilateral LE edema, skin changes on lower extremities that may represent venous stasis dermatitis  Neuro: A&Ox3 Psych: Normal behavior and affect   Filed Weights   10/25/21 0436 10/25/21 0951  Weight: 80.7 kg 80.7 kg     Intake/Output Summary (Last 24 hours) at 10/25/2021 1022 Last data filed at 10/24/2021 2253 Gross per 24 hour  Intake --  Output 750 ml  Net -750 ml   Net IO Since Admission: -750 mL [10/25/21 1022]  Pertinent Labs: CBC Latest Ref Rng & Units 10/25/2021 10/24/2021 10/21/2021  WBC 4.0 - 10.5 K/uL 3.0(L) 6.0 12.1(H)  Hemoglobin 12.0 - 15.0 g/dL 10.8(L) 10.3(L) 11.2  Hematocrit 36.0 - 46.0 % 34.0(L) 33.8(L) 36.1  Platelets 150 - 400 K/uL 207 222 227    CMP Latest Ref Rng & Units 10/25/2021 10/24/2021 10/21/2021  Glucose 70 - 99 mg/dL 179(H) 126(H) 131(H)  BUN 8 - 23 mg/dL 8 9 11   Creatinine 0.44 - 1.00 mg/dL 0.72 0.76 0.71   Sodium 135 - 145 mmol/L 135 135 142  Potassium 3.5 - 5.1 mmol/L 4.0 3.7 4.0  Chloride 98 - 111 mmol/L 100 100 101  CO2 22 - 32 mmol/L 29 27 27   Calcium 8.9 - 10.3 mg/dL 9.0 8.8(L) 9.1  Total Protein 6.5 - 8.1 g/dL - - -  Total Bilirubin 0.3 - 1.2 mg/dL - - -  Alkaline Phos 38 - 126 U/L - - -  AST 15 - 41 U/L - - -  ALT 0 - 44 U/L - - -    Imaging: DG Chest 2 View  Result Date: 10/24/2021 CLINICAL DATA:  PNA EXAM: CHEST - 2 VIEW COMPARISON:  October 21, 2021 FINDINGS: The cardiomediastinal silhouette is unchanged in contour.Atherosclerotic calcifications. Small LEFT pleural effusion. No pneumothorax. Unchanged LEFT basilar opacity. Visualized abdomen is unremarkable. Extensive posterior fixation of the thoracolumbar spine. IMPRESSION: Stable chest radiograph. Electronically Signed   By: Valentino Saxon M.D.   On: 10/24/2021 11:36    Assessment/Plan:   Principal Problem:   Acute exacerbation of CHF (congestive heart failure) (HCC) Active Problems:   Permanent atrial fibrillation (HCC)   CAD (coronary artery disease)   Patient Summary: Mrs. [name] [name] is a 80 y.o. female with a history of CAD, chronic afib on Elliquis, CHF, MGUS, and dysphagia who presents to the ED due to lingering cough and SOB.   #Acute CHF exacerbation  Patient originally diagnosed with CAP by her PCP but is extremely well appearing, has normal WBC of 6, and has remained afebrile with O2 sat of 95% on room air. Given that she has noticed worsening LE edema, SOB, and had significantly elevated JVP, I suspect acute exacerbation of her combined CHF, potentially due to recent COVID infection, decreased adherence to Lasix, or pneumonia. It is possible she had pneumonia since she noted marked improvement in her cough symptoms since starting the antibiotic course + Robitussin on 11/1 and had WBC of 12.1, although I do not think it is the strongest contributor to her current symptoms.  Completed day 5 of antibiotic  course.  Ruled out ACS as troponin is flat.  I suspect that her acute CHF exacerbation is likely secondary to A. fib with RVR  -BNP 270 -Troponins 5 > 5 -Pro-Calcitonin less than 0.1 -IV Lasix 80mg  received 11/4 -Daily weights  -Strict I/Os -Completed day 5 of amoxicillin and azithromycin -Guaifenesin PRN cough  -Plan for cardiology consult should patient/tachycardia in  the afternoon.    #GERD Continue home Protonix 40mg  po daily    #A. fib with RVR Patient noted to have elevated heart rates in the 140s to 150s while ambulating today.  This was prior to the patient receiving her a.m. Cardizem dose.  We will ambulate the patient again this afternoon after she has received her Cardizem to see if her heart rate improves. Continue home diltiazem 120mg  po BID for now.  If she remains tachycardic we will need to titrate this up Continue home Elliquis 5mg  BID    #CAD  Continue home rosuvastatin 10mg  po daily  Continue home aspirin 81mg  po daily    #Depression  Continue home Fluoxetine 40mg  po   Diet: Dysphagia 3 VTE: Elliquis  IVF: Hold at this time  Code: Full    Dispo: Admit patient to Observation with expected length of stay less than 2 midnights.  Diet:  Dysphagia IVF: None,None VTE:  Eliquis Code: Full PT/OT recs: None, TOC recs: None   Dispo: Anticipated discharge to Home in 1 days pending tachycardia resolution.   France Ravens, MD 10/25/2021, 10:22 AM Pager: [phone]  Please contact the on call pager after 5 pm and on weekends at [phone].

## 2021-10-25 NOTE — Progress Notes (Addendum)
In an attempt to count med from home (lunesta 3 mg tab). I dropped 11 pills on the floor. Daughter [name] at bedside with patient and they both witnessed this. Charge Nurse, Kerrie Buffalo was notified. Kerrie Buffalo, RN witnessed me waste into the control med waste container in the pyxis room.  Dr. Aldine Contes notified

## 2021-10-25 NOTE — Progress Notes (Signed)
OT walked with pt to the bathroom and back to the chair. HR went as high as 172. MD notified. Will continue to monitor.

## 2021-10-26 ENCOUNTER — Other Ambulatory Visit (HOSPITAL_COMMUNITY): Payer: Medicare HMO

## 2021-10-26 DIAGNOSIS — I2510 Atherosclerotic heart disease of native coronary artery without angina pectoris: Secondary | ICD-10-CM

## 2021-10-26 LAB — BASIC METABOLIC PANEL
Anion gap: 6 (ref 5–15)
BUN: 18 mg/dL (ref 8–23)
CO2: 27 mmol/L (ref 22–32)
Calcium: 8.6 mg/dL — ABNORMAL LOW (ref 8.9–10.3)
Chloride: 102 mmol/L (ref 98–111)
Creatinine, Ser: 0.74 mg/dL (ref 0.44–1.00)
GFR, Estimated: >60 mL/min (ref >60)
Glucose, Bld: 140 mg/dL — ABNORMAL HIGH (ref 70–99)
Potassium: 3.7 mmol/L (ref 3.5–5.1)
Sodium: 135 mmol/L (ref 135–145)

## 2021-10-26 LAB — GLUCOSE, CAPILLARY: Glucose-Capillary: 103 mg/dL — ABNORMAL HIGH (ref 70–99)

## 2021-10-26 LAB — CBC
HCT: 31.5 % — ABNORMAL LOW (ref 36.0–46.0)
Hemoglobin: 9.7 g/dL — ABNORMAL LOW (ref 12.0–15.0)
MCH: 27.5 pg (ref 26.0–34.0)
MCHC: 30.8 g/dL (ref 30.0–36.0)
MCV: 89.2 fL (ref 80.0–100.0)
Platelets: 222 10*3/uL (ref 150–400)
RBC: 3.53 MIL/uL — ABNORMAL LOW (ref 3.87–5.11)
RDW: 16.4 % — ABNORMAL HIGH (ref 11.5–15.5)
WBC: 6.5 10*3/uL (ref 4.0–10.5)
nRBC: 0 % (ref 0.0–0.2)

## 2021-10-26 MED ORDER — FENTANYL 50 MCG/HR TD PT72
1.0000 | MEDICATED_PATCH | TRANSDERMAL | Status: DC
  Administered 2021-10-26: 1 via TRANSDERMAL
  Filled 2021-10-26: qty 1

## 2021-10-26 MED ORDER — DILTIAZEM HCL ER COATED BEADS 240 MG PO CP24
240.0000 mg | ORAL_CAPSULE | Freq: Every day | ORAL | Status: DC
  Administered 2021-10-27 – 2021-10-28 (×2): 240 mg via ORAL
  Filled 2021-10-26 (×2): qty 1

## 2021-10-26 NOTE — Progress Notes (Signed)
Progress Note   Subjective   Doing well today, [name] [name] denies CP.  SOB is much improved.  No new concerns  Inpatient Medications    Scheduled Meds:  apixaban  5 mg Oral BID   aspirin EC  81 mg Oral Daily   diltiazem  120 mg Oral QHS   diltiazem  240 mg Oral Daily   Eszopiclone  3 mg Oral QHS   FLUoxetine  40 mg Oral Daily   furosemide  80 mg Oral Daily   pantoprazole  40 mg Oral BID   rosuvastatin  10 mg Oral QODAY   rosuvastatin  5 mg Oral QODAY   Continuous Infusions:  PRN Meds: acetaminophen **OR** acetaminophen, guaiFENesin, HYDROcodone-acetaminophen, melatonin   Vital Signs    Vitals:   10/25/21 1924 10/26/21 0500 10/26/21 0806 10/26/21 0810  BP: (!) 102/49 120/62  119/68  Pulse: 75 96 73   Resp: 17 17    Temp: 98.2 F (36.8 C) 98.2 F (36.8 C) 98.2 F (36.8 C)   TempSrc: Oral Oral Oral   SpO2: 94%     Weight:  82 kg    Height:        Intake/Output Summary (Last 24 hours) at 10/26/2021 1006 Last data filed at 10/26/2021 0500 Gross per 24 hour  Intake 240 ml  Output 500 ml  Net -260 ml   Filed Weights   10/25/21 0436 10/25/21 0951 10/26/21 0500  Weight: 80.7 kg 80.7 kg 82 kg    Telemetry    Afib, V rates are better controlled - Personally Reviewed  Physical Exam   GEN- [name] [name] is elderly and frail appearing, alert and anxious Head- normocephalic, atraumatic Eyes-  Sclera clear, conjunctiva pink Ears- hearing intact Oropharynx- clear Neck- supple, Lungs-  normal work of breathing, few basilar rales,  + rhonchii Heart- irregular rate and rhythm  GI- soft  Extremities- + dependant edema  MS- diffuse atrophy Skin- no rash or lesion Psych- very anxious Neuro- strength and sensation are intact   Labs    Chemistry Recent Labs  Lab 10/24/21 1119 10/25/21 0139 10/26/21 0230  NA 135 135 135  K 3.7 4.0 3.7  CL 100 100 102  CO2 27 29 27   GLUCOSE 126* 179* 140*  BUN 9 8 18   CREATININE 0.76 0.72 0.74  CALCIUM 8.8* 9.0 8.6*   GFRNONAA >60 >60 >60  ANIONGAP 8 6 6      Hematology Recent Labs  Lab 10/24/21 1119 10/25/21 0139 10/26/21 0230  WBC 6.0 3.0* 6.5  RBC 3.74* 3.84* 3.53*  HGB 10.3* 10.8* 9.7*  HCT 33.8* 34.0* 31.5*  MCV 90.4 88.5 89.2  MCH 27.5 28.1 27.5  MCHC 30.5 31.8 30.8  RDW 16.6* 16.2* 16.4*  PLT 222 207 222     [name] ID  [name] [name] is a 80 y.o. female with a history of CAD with complex bifurcational distal left main/ostial LCx disease (felt to be a poor CABG candidate so underwent PCI/DES to this lesion in 11/2014), chronic diastolic CHF, permanent atrial fibrillation on Eliquis, prior CVA, hypertension with prior orthostatic hypotension and multiple falls in [name] past, hyperlipidemia, hepatic steatosis, and chronic back pain who is being seen today for [name] evaluation of fibrillation with RVR at [name] request of Dr. Dareen Piano.  Assessment & Plan    1.  Acute on chronic diastolic dysfunction Clinically improving Echo pending Continue gentle diuresis as tolerated Sodium restriction advised  2. Permanent afib Better rate controlled with diltiazem Continue eliquis  for stroke prevention  3. CAD No ischemic symptoms No plans for further risk stratification currently  4. HTN Stable No change required today   Thompson Grayer MD, [name] 10/26/2021 10:06 AM

## 2021-10-26 NOTE — Progress Notes (Signed)
Pt ambulated in hallway. HR between 75-100

## 2021-10-26 NOTE — Progress Notes (Addendum)
Subjective:  Patient seen at bedside during AM rounds. [name] [name] reports that she slept wonderfully last night after taking her home Lunesta. She states that her cough is better. She continues to struggle with dysphagia. She denies headache, chest pain, abdominal pain, and changes in her usual palpitations. She shares that she is scared to go home and have a heart attack.   Objective:  Vital signs in last 24 hours: Vitals:   10/25/21 1924 10/26/21 0500 10/26/21 0806 10/26/21 0810  BP: (!) 102/49 120/62  119/68  Pulse: 75 96 73   Resp: 17 17    Temp: 98.2 F (36.8 C) 98.2 F (36.8 C) 98.2 F (36.8 C)   TempSrc: Oral Oral Oral   SpO2: 94%     Weight:  82 kg    Height:       Weight change: 0 kg  Intake/Output Summary (Last 24 hours) at 10/26/2021 0852 Last data filed at 10/26/2021 0500 Gross per 24 hour  Intake 240 ml  Output 500 ml  Net -260 ml   Physical Exam: Gen: Alert, anxious  CV: Irregularly irregular rhythm, normal S1 and S2, no murmurs heard Pulm: Crackles heard at base of LLL. Normal WOB. No wheezing or rales  MSK: Lower legs tender to light touch. Edema significantly improved  Neuro: A&Ox3 Psych: Appropriate behavior and affect    Lab Results:   CBC Latest Ref Rng & Units 10/26/2021 10/25/2021 10/24/2021  WBC 4.0 - 10.5 K/uL 6.5 3.0(L) 6.0  Hemoglobin 12.0 - 15.0 g/dL 9.7(L) 10.8(L) 10.3(L)  Hematocrit 36.0 - 46.0 % 31.5(L) 34.0(L) 33.8(L)  Platelets 150 - 400 K/uL 222 207 222   BMP Latest Ref Rng & Units 10/26/2021 10/25/2021 10/24/2021  Glucose 70 - 99 mg/dL 140(H) 179(H) 126(H)  BUN 8 - 23 mg/dL 18 8 9   Creatinine 0.44 - 1.00 mg/dL 0.74 0.72 0.76  BUN/Creat Ratio 12 - 28 - - -  Sodium 135 - 145 mmol/L 135 135 135  Potassium 3.5 - 5.1 mmol/L 3.7 4.0 3.7  Chloride 98 - 111 mmol/L 102 100 100  CO2 22 - 32 mmol/L 27 29 27   Calcium 8.9 - 10.3 mg/dL 8.6(L) 9.0 8.8(L)   Lab Results  Component Value Date   MG 1.9 10/25/2021   CBG (last 3)  Recent Labs     10/26/21 0814  GLUCAP 103*   Assessment/Plan: [name] [name] is a 80 y.o. female with a history of CAD, chronic afib on Elliquis, CHF, MGUS, and dysphagia who presents to the ED due to lingering cough and SOB.   Principal Problem:   Acute exacerbation of CHF (congestive heart failure) (HCC) Active Problems:   Permanent atrial fibrillation (HCC)   CAD (coronary artery disease)  #Acute on chronic HFmREF excacerbation Patient reports improved cough, and on exam volume status is improved as well. She is oxygenating well on room air. Will continue gentle diuresis with PO Lasix today, which she appears to have responded well to. She was -249mL yesterday and Cr has remained stable at 0.74. -Cardiology consulted, appreciate recs  -Continue PO Lasix 80mg    -Strict I/Os  -Daily weights   #Permanent afib  Patient's HR has ranged between 65-81 today after increasing her AM dose of diltiazem to 240mg . We will have her ambulate again and assess her HR.  -Continue Elliquis 5mg  BID  -Continue Diltiazem 240mg  qAM and 120mg  qPM   #GERD -Continue home Protonix 40mg  po daily   #CAD  -Continue home rosuvastatin 10mg  po  daily  -Continue home aspirin 81mg  po daily    #Depression  -Continue home Fluoxetine 40mg  po daily and Lunesta nightly for sleep.    Diet: Dysphagia 3 VTE: Elliquis  IVF: None  Code: Full   Prior to admission living arrangement: Home Anticipated discharge location: Home  Barriers to discharge: Pending medical stability  Dispo: Anticipated discharge in 1-2 days    LOS: 1 day   Stefani Dama, Medical Student 10/26/2021, 8:52 AM [phone]  Attestation for Student Documentation:  I personally was present and performed or re-performed the history, physical exam and medical decision-making activities of this service and have verified that the service and findings are accurately documented in the student's note.  Gaylan Gerold, DO 10/26/2021, 10:51 AM   Please  contact the on call pager after 5 pm and on weekends at [phone].

## 2021-10-27 ENCOUNTER — Inpatient Hospital Stay (HOSPITAL_COMMUNITY): Payer: Medicare HMO

## 2021-10-27 DIAGNOSIS — I4819 Other persistent atrial fibrillation: Secondary | ICD-10-CM | POA: Diagnosis not present

## 2021-10-27 DIAGNOSIS — I4821 Permanent atrial fibrillation: Secondary | ICD-10-CM

## 2021-10-27 LAB — ECHOCARDIOGRAM COMPLETE
AR max vel: 1.39 cm2
AV Area VTI: 1.39 cm2
AV Area mean vel: 1.35 cm2
AV Mean grad: 4.3 mmHg
AV Peak grad: 7.8 mmHg
Ao pk vel: 1.39 m/s
Area-P 1/2: 3.03 cm2
Height: 66 in
S' Lateral: 3.1 cm
Weight: 2807.78 oz

## 2021-10-27 LAB — BASIC METABOLIC PANEL
Anion gap: 7 (ref 5–15)
BUN: 20 mg/dL (ref 8–23)
CO2: 29 mmol/L (ref 22–32)
Calcium: 8.7 mg/dL — ABNORMAL LOW (ref 8.9–10.3)
Chloride: 101 mmol/L (ref 98–111)
Creatinine, Ser: 0.82 mg/dL (ref 0.44–1.00)
GFR, Estimated: >60 mL/min (ref >60)
Glucose, Bld: 111 mg/dL — ABNORMAL HIGH (ref 70–99)
Potassium: 3.7 mmol/L (ref 3.5–5.1)
Sodium: 137 mmol/L (ref 135–145)

## 2021-10-27 LAB — CBC
HCT: 30.9 % — ABNORMAL LOW (ref 36.0–46.0)
Hemoglobin: 9.5 g/dL — ABNORMAL LOW (ref 12.0–15.0)
MCH: 27.4 pg (ref 26.0–34.0)
MCHC: 30.7 g/dL (ref 30.0–36.0)
MCV: 89 fL (ref 80.0–100.0)
Platelets: 211 10*3/uL (ref 150–400)
RBC: 3.47 MIL/uL — ABNORMAL LOW (ref 3.87–5.11)
RDW: 16.2 % — ABNORMAL HIGH (ref 11.5–15.5)
WBC: 6.3 10*3/uL (ref 4.0–10.5)
nRBC: 0 % (ref 0.0–0.2)

## 2021-10-27 LAB — GLUCOSE, CAPILLARY: Glucose-Capillary: 90 mg/dL (ref 70–99)

## 2021-10-27 MED ORDER — POTASSIUM CHLORIDE CRYS ER 20 MEQ PO TBCR
40.0000 meq | EXTENDED_RELEASE_TABLET | Freq: Once | ORAL | Status: AC
  Administered 2021-10-27: 40 meq via ORAL
  Filled 2021-10-27: qty 2

## 2021-10-27 MED ORDER — SODIUM CHLORIDE 0.9 % IV SOLN: INTRAVENOUS | Status: DC | PRN

## 2021-10-27 MED ORDER — SODIUM CHLORIDE 0.9 % IV SOLN
400.0000 mg | Freq: Once | INTRAVENOUS | Status: AC
  Administered 2021-10-27: 400 mg via INTRAVENOUS
  Filled 2021-10-27: qty 20

## 2021-10-27 NOTE — Progress Notes (Signed)
Progress Note  Patient Name: [name] [name] Date of Encounter: 10/27/2021  [facility] Cardiologist: Sherren Mocha, MD   Subjective   Feeling better.  No chest pain, shortness of breath, or heart palpitations.  Inpatient Medications    Scheduled Meds:  apixaban  5 mg Oral BID   aspirin EC  81 mg Oral Daily   diltiazem  120 mg Oral QHS   diltiazem  240 mg Oral Daily   Eszopiclone  3 mg Oral QHS   fentaNYL  1 patch Transdermal Q72H   FLUoxetine  40 mg Oral Daily   furosemide  80 mg Oral Daily   pantoprazole  40 mg Oral BID   rosuvastatin  10 mg Oral QODAY   rosuvastatin  5 mg Oral QODAY   Continuous Infusions:  PRN Meds: acetaminophen **OR** acetaminophen, guaiFENesin, HYDROcodone-acetaminophen, melatonin   Vital Signs    Vitals:   10/27/21 0310 10/27/21 0401 10/27/21 0748 10/27/21 0827  BP: 114/73  120/61 120/61  Pulse:   79   Resp:      Temp:   97.7 F (36.5 C)   TempSrc:   Oral   SpO2: 93%     Weight:  79.6 kg    Height:        Intake/Output Summary (Last 24 hours) at 10/27/2021 0901 Last data filed at 10/27/2021 0600 Gross per 24 hour  Intake 100 ml  Output 600 ml  Net -500 ml   Last 3 Weights 10/27/2021 10/26/2021 10/25/2021  Weight (lbs) 175 lb 7.8 oz 180 lb 12.4 oz 177 lb 14.6 oz  Weight (kg) 79.6 kg 82 kg 80.7 kg      Telemetry    Atrial fibrillation with controlled rate - Personally Reviewed  ECG    N/A  Physical Exam  Alert, oriented, elderly woman in no distress GEN: No acute distress.   Neck: No JVD Cardiac: Irregularly irregular, no murmurs, rubs, or gallops.  Respiratory: Clear to auscultation bilaterally. GI: Soft, nontender, non-distended  MS: No edema; No deformity. Neuro:  Nonfocal  Psych: Normal affect   Labs    High Sensitivity Troponin:   Recent Labs  Lab 10/24/21 1628 10/24/21 1835  TROPONINIHS 5 5     Chemistry Recent Labs  Lab 10/24/21 1119 10/25/21 0139 10/25/21 1431 10/26/21 0230  NA 135 135  --   135  K 3.7 4.0  --  3.7  CL 100 100  --  102  CO2 27 29  --  27  GLUCOSE 126* 179*  --  140*  BUN 9 8  --  18  CREATININE 0.76 0.72  --  0.74  CALCIUM 8.8* 9.0  --  8.6*  MG  --   --  1.9  --   GFRNONAA >60 >60  --  >60  ANIONGAP 8 6  --  6    Lipids  Recent Labs  Lab 10/24/21 1420  TRIG 81    Hematology Recent Labs  Lab 10/25/21 0139 10/26/21 0230 10/27/21 0356  WBC 3.0* 6.5 6.3  RBC 3.84* 3.53* 3.47*  HGB 10.8* 9.7* 9.5*  HCT 34.0* 31.5* 30.9*  MCV 88.5 89.2 89.0  MCH 28.1 27.5 27.4  MCHC 31.8 30.8 30.7  RDW 16.2* 16.4* 16.2*  PLT 207 222 211   Thyroid No results for input(s): TSH, FREET4 in the last 168 hours.  BNP Recent Labs  Lab 10/24/21 1628  BNP 270.0*    DDimer  Recent Labs  Lab 10/24/21 1420  DDIMER 0.50  Radiology    No results found.  Cardiac Studies   Pending echo   Patient Profile     80 y.o. female with a history of CAD with complex bifurcational distal left main/ostial LCx disease (felt to be a poor CABG candidate so underwent PCI/DES to this lesion in 11/2014), chronic diastolic CHF, permanent atrial fibrillation on Eliquis, prior CVA, hypertension with prior orthostatic hypotension and multiple falls in the past, hyperlipidemia, hepatic steatosis, and chronic back pain who is being seen for the evaluation of fibrillation with RVR at the request of Dr. Dareen Piano.  Assessment & Plan    Atrial fibrillation with rapid ventricular rate -Patient does have longstanding history of atrial fibrillation  -heart rate was elevated on admission.  This is likely driven by [id number]. Cardizem CD will increase to 240 mg AM and 120mg  PM.  Now heart rate has stabilized and controlled at 60 to 70 bpm.   -Pending echocardiogram -Continue Eliquis for anticoagulation  2.  CAD -No chest pain - Continue ASA and statin   3. COVID 19 - Per primary team    For questions or updates, please contact [place] Please consult [email or web address] for  contact info under        SignedLeanor Kail, PA  10/27/2021, 9:02 AM     Patient seen, examined. Available data reviewed. Agree with findings, assessment, and plan as outlined by Robbie Lis, PA.  The patient is independently interviewed and examined.  I agree with the findings outlined above.  The patient appears to be doing much better with well-controlled atrial fibrillation on her current AV nodal blocking regimen with twice daily Cardizem CD at a dose of 240 mg in the morning and 120 mg in the evening.  She is tolerating oral anticoagulation with apixaban.  I have not recommended any further changes to her medical regimen.  The patient has permanent atrial fibrillation and has been managed long-term with rate control and anticoagulation.  Her rate control has been borderline at times and she tends to run on the fast side with difficulty titrating her medicines because of hypotension.  I think at present she is on a good regimen and would continue this without further changes.  [facility] will sign off.   Medication Recommendations:  continue current Rx Other recommendations (labs, testing, etc):  none Follow up as an outpatient:  will arrange   Sherren Mocha, M.D. 10/27/2021 11:24 AM

## 2021-10-27 NOTE — Progress Notes (Signed)
Occupational Therapy Treatment Patient Details Name: [name] [name] MRN: [id number] DOB: 12-13-1941 Today's Date: 10/27/2021   History of present illness Pt is an 80 y.o. female admitted 11/4 for acute CHF exacerbation. She presented to the ED due to cough and SOB. She was diagnosised with COVID in late Sept. 2022. PMH: CAD, chronic afib on Elliquis, CHF, MGUS, and dysphagia   OT comments  Pt is progressing towards OT goals. Pt remains limited by pain, balance, and activity tolerance, although HR much more stable compared to previous session. During session, pt stood at sink to complete grooming activities with supervision and tolerated well. Will continue to follow acutely.   Recommendations for follow up therapy are one component of a multi-disciplinary discharge planning process, led by the attending physician.  Recommendations may be updated based on patient status, additional functional criteria and insurance authorization.    Follow Up Recommendations  No OT follow up    Assistance Recommended at Discharge Intermittent Supervision/Assistance  Equipment Recommendations  None recommended by OT    Recommendations for Other Services      Precautions / Restrictions Precautions Precautions: Fall;Other (comment) Precaution Comments: watch HR Restrictions Weight Bearing Restrictions: No       Mobility Bed Mobility Overal bed mobility: Needs Assistance Bed Mobility: Supine to Sit;Sit to Supine     Supine to sit: Min assist;HOB elevated Sit to supine: Min assist        Transfers Overall transfer level: Needs assistance Equipment used: Rolling walker (2 wheels) Transfers: Sit to/from Stand Sit to Stand: Min guard                 Balance Overall balance assessment: Needs assistance Sitting-balance support: Feet supported;No upper extremity supported Sitting balance-Leahy Scale: Good     Standing balance support: Single extremity supported;Bilateral upper  extremity supported;During functional activity Standing balance-Leahy Scale: Fair Standing balance comment: Able to let go over walker with both hands while grooming at sink.                           ADL either performed or assessed with clinical judgement   ADL Overall ADL's : Needs assistance/impaired     Grooming: Wash/dry hands;Wash/dry face;Oral care;Brushing hair;Applying deodorant;Supervision/safety;Standing Grooming Details (indicate cue type and reason): At sink. Pt with good tolerance of activity and balance while standing for ~10 mins.                             Functional mobility during ADLs: Min guard;Rolling walker (2 wheels) General ADL Comments: Pt limited by pain, balance, and activity tolerance. Requiring some cues for pacing/safety with functional mobility.    Extremity/Trunk Assessment              Vision       Perception     Praxis      Cognition Arousal/Alertness: Awake/alert Behavior During Therapy: WFL for tasks assessed/performed Overall Cognitive Status: Within Functional Limits for tasks assessed                                            Exercises     Shoulder Instructions       General Comments Max HR 122 during activity. 67 at rest on arrival.    Pertinent Vitals/ Pain  Pain Assessment: Faces Faces Pain Scale: Hurts little more Pain Location: back Pain Descriptors / Indicators: Aching;Grimacing;Guarding Pain Intervention(s): Monitored during session;Repositioned  Home Living                                          Prior Functioning/Environment              Frequency  Min 2X/week        Progress Toward Goals  OT Goals(current goals can now be found in the care plan section)  Progress towards OT goals: Progressing toward goals  Acute Rehab OT Goals Patient Stated Goal: return home OT Goal Formulation: With patient/family Time For Goal  Achievement: 11/08/21 Potential to Achieve Goals: Good ADL Goals Pt Will Perform Grooming: with modified independence;standing Pt Will Perform Lower Body Bathing: with modified independence Pt Will Perform Lower Body Dressing: with modified independence;with adaptive equipment;sit to/from stand Pt Will Transfer to Toilet: with modified independence;ambulating;bedside commode Pt Will Perform Toileting - Clothing Manipulation and hygiene: with modified independence;sitting/lateral leans  Plan Discharge plan remains appropriate;Frequency remains appropriate    Co-evaluation                 AM-PAC OT "6 Clicks" Daily Activity     Outcome Measure   Help from another person eating meals?: None Help from another person taking care of personal grooming?: A Little Help from another person toileting, which includes using toliet, bedpan, or urinal?: A Little Help from another person bathing (including washing, rinsing, drying)?: A Little Help from another person to put on and taking off regular upper body clothing?: A Little Help from another person to put on and taking off regular lower body clothing?: A Little 6 Click Score: 19    End of Session Equipment Utilized During Treatment: Gait belt;Rolling walker (2 wheels)  OT Visit Diagnosis: Unsteadiness on feet (R26.81);Other abnormalities of gait and mobility (R26.89);Pain   Activity Tolerance Patient tolerated treatment well   Patient Left in bed;with call bell/phone within reach;with family/visitor present   Nurse Communication Mobility status        Time: 8032-1224 OT Time Calculation (min): 29 min  Charges: OT General Charges $OT Visit: 1 Visit OT Treatments $Self Care/Home Management : 23-37 mins  Jasamine Pottinger C, OT/L  Acute Rehab [place] 10/27/2021, 2:14 PM

## 2021-10-27 NOTE — Evaluation (Signed)
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 Abnl nuc 11/2014. Cath 12/2014 - turned down for CABG. Ultimately s/p TTVP, rotational atherectomy, PTCA and stenting of the ostial LCx and left main into the LAD (crush technique), and IVUS of the LAD/Left main. // b. Myoview 11/17: EF 48, poor quality/significant artifact; inf-lateral, inferior ischemia; Intermediate Risk   Chronic atrial fibrillation ([place])    a.  First noted post-op 9/15 spinal fusion.  She had cardioversion, not on anticoagulation. Fall risk, unsteady. // failed DCCV // Holter 10/17: AFib, Avg HR 97, PVCs, no other arrhythmia   Chronic back pain greater than 3 months duration    a. spinal stenosis.  Spinal fusion with rods in 2/15 at [facility] spinal fusion 9/15.   Chronic diastolic CHF    Echo 7/49:  EF 50-55%, trivial AI, midl MR, mod LAE, PASP 37 mmHg // Echocardiogram 03/2020: EF 45-50, no RWMA, mild LVH, mild reduced RVSF, mildly elevated PASP (RVSP 38.3), mild LAE, mild MR, mild to mod TR, trivial AI // Echo 9/21: EF 45, mild LVH, mildly reduced RV SF, moderate LAE, mild RAE, mild MR, mild AI      Circadian rhythm sleep disorder    CTS (carpal tunnel syndrome)    Deficiency anemia 11/10/2014   Depression    Diarrhea    Diverticulitis of colon    Esophageal stricture    Gastritis    Gastroesophageal hernia 07/06/2013   GERD (gastroesophageal reflux disease)    Hiatal  hernia    History of blood transfusion    "most of them related to OR's"    HTN (hypertension)    Hyperlipidemia    IBS (irritable bowel syndrome)    Incontinence 10/13/2012   Insomnia    Ischemic chest pain ([place])    Memory disorder 44/96/7591   Metabolic syndrome 63/84/6659   MGUS (monoclonal gammopathy of unknown significance) dx'd 11/2014   a. Neg BMB 11/2014.   Multiple falls    Obesity    Obstructive sleep apnea    "have mask; don't wear it" (01/07/2015)   Orthostasis    PAT (paroxysmal atrial tachycardia) ([place])    Personal history of colonic polyps 10/25/2011 & 12/02/11   not retrieved Dr Lyla Son & tubular adenomas   Pneumonia 03/2014   Sinus bradycardia    a. Baseline HR 50s-60s.   Stroke [facility]) early 2000's   "small"; denies residual on 01/07/2015)   Past Surgical History:  Past Surgical History:  Procedure Laterality Date   BACK SURGERY     BONE MARROW BIOPSY  11/2014   CARDIAC CATHETERIZATION  12/27/2014   Procedure: INTRAVASCULAR PRESSURE WIRE/FFR STUDY;  Surgeon: Larey Dresser, MD;  Location: [facility] CATH LAB;  Service: Cardiovascular;;   CARDIAC CATHETERIZATION N/A 11/25/2016   Procedure: Left Heart Cath and Coronary Angiography;  Surgeon: Sherren Mocha, MD;  Location: [place] CV LAB;  Service: Cardiovascular;  Laterality: N/A;  CARDIOVERSION N/A 02/13/2016   Procedure: CARDIOVERSION;  Surgeon: Josue Hector, MD;  Location: [place];  Service: Cardiovascular;  Laterality: N/A;   CARPAL TUNNEL RELEASE Right 1980's   CATARACT EXTRACTION Bilateral    CATARACT EXTRACTION W/ INTRAOCULAR LENS  IMPLANT, BILATERAL  2000's   CORONARY ANGIOPLASTY WITH STENT PLACEMENT  01/07/2015   "2"   CORONARY STENT PLACEMENT     DILATION AND CURETTAGE OF UTERUS     ESOPHAGOGASTRODUODENOSCOPY (EGD) WITH ESOPHAGEAL DILATION  "several times"   JOINT REPLACEMENT     KNEE ARTHROSCOPY Left 1995   LAPAROSCOPIC CHOLECYSTECTOMY  2003   LEFT HEART CATHETERIZATION WITH CORONARY  ANGIOGRAM N/A 12/27/2014   Procedure: LEFT HEART CATHETERIZATION WITH CORONARY ANGIOGRAM;  Surgeon: Larey Dresser, MD;  Location: [facility] CATH LAB;  Service: Cardiovascular;  Laterality: N/A;   PERCUTANEOUS CORONARY ROTOBLATOR INTERVENTION (PCI-R)  01/07/2015   PERCUTANEOUS CORONARY ROTOBLATOR INTERVENTION (PCI-R) N/A 01/07/2015   Procedure: PERCUTANEOUS CORONARY ROTOBLATOR INTERVENTION (PCI-R);  Surgeon: Blane Ohara, MD;  Location: [facility] CATH LAB;  Service: Cardiovascular;  Laterality: N/A;   POSTERIOR FUSION THORACIC SPINE  08/2015   POSTERIOR LUMBAR FUSION  01/2015   TOTAL KNEE ARTHROPLASTY Bilateral 1990's - 2000's   HPI:  Pt is an 80 y.o. female admitted 11/4 for acute CHF exacerbation. She presented to the ED due to cough and SOB. She was diagnosised with COVID in late Sept. 2022. PMH: CAD, chronic afib on Elliquis, CHF, MGUS, and dysphagia.  Pt has a long-standing history of a primary esophageal dysphagia.  MBS at [facility] in April 2020 showed penetration of thin liquids, prominent CP bar, and reflux from the esophagus into the hypopharynx.   She was followed by SLP during July 2021 admission, at which time she had a choking episode on a potassium pill. Repeat barium swallow during that admission revealed persistent corkscrew appearance of distal esophagus and ABSENCE of normal peristalsis, creating esophageal stasis.  She was D/Cd on a regular diet with standard esophageal precautions.  She has a consult pending with GI at [facility].  She reports significant worsening of dysphagia last six months - she avoids eating out; she has frequent regurgitation and increased phlegm production.  Difficulties occur with solid foods and liquids, and she will bring up undigested materials after only a few bites/sips.    Assessment / Plan / Recommendation  Clinical Impression  Pt continues to present with s/sx of a primary esophageal dysphagia.  Oral mechanism exam was normal.  She self fed applesauce and thin liquids,  with only 2-3 boluses consumed before she began to regurgitate material.  She verbalized great frustration regarding worsening condition. We reviewed images and read report from July 2021 barium swallow, which described absence of normal peristalsis and presence of stasis.  We reviewed how these test results impact her ability to eat/drink on a daily basis.  Per pt/daughter, there is a plan to repeat barium swallow during this admission.  Encouraged [name] [name] to f/u with [facility] as scheduled.  SLP will follow briefly for further education/strategies after barium swallow - unfortunately, there are minimal modifications that can be made to compensate for dysmotility.  Recommend advancing diet to regular solids so that [name] [name] has more options and can select foods that are easier for her to manage.  She agrees with plan. D/W RN. SLP Visit Diagnosis: Dysphagia, pharyngoesophageal phase (R13.14)           Diet Recommendation   Regular solids, thin liquids  Medication  Administration: Crushed with puree    Other  Recommendations Oral Care Recommendations: Oral care BID    Recommendations for follow up therapy are one component of a multi-disciplinary discharge planning process, led by the attending physician.  Recommendations may be updated based on patient status, additional functional criteria and insurance authorization.  Follow up Recommendations None      Frequency and Duration min 1 x/week  1 week       Prognosis Barriers to Reach Goals: Severity of deficits      Swallow Study   General HPI: Pt is an 80 y.o. female admitted 11/4 for acute CHF exacerbation. She presented to the ED due to cough and SOB. She was diagnosised with COVID in late Sept. 2022. PMH: CAD, chronic afib on Elliquis, CHF, MGUS, and dysphagia.  Pt has a long-standing history of a primary esophageal dysphagia.  MBS at [facility] in April 2020 showed penetration of thin liquids, prominent CP bar, and reflux from the  esophagus into the hypopharynx.   She was followed by SLP during July 2021 admission, at which time she had a choking episode on a potassium pill. Repeat barium swallow during that admission revealed persistent corkscrew appearance of distal esophagus and ABSENCE of normal peristalsis, creating esophageal stasis.  She was D/Cd on a regular diet with standard esophageal precautions.  She has a consult pending with GI at [facility].  She reports significant worsening of dysphagia last six months - she avoids eating out; she has frequent regurgitation and increased phlegm production.  Difficulties occur with solid foods and liquids, and she will bring up undigested materials after only a few bites/sips. Type of Study: Bedside Swallow Evaluation Previous Swallow Assessment: see HPI Diet Prior to this Study: Dysphagia 3 (soft);Thin liquids Temperature Spikes Noted: No Respiratory Status: Room air History of Recent Intubation: No Behavior/Cognition: Alert;Cooperative Oral Cavity Assessment: Within Functional Limits Oral Care Completed by SLP: No Oral Cavity - Dentition: Dentures, top;Dentures, bottom Vision: Functional for self-feeding Self-Feeding Abilities: Able to feed self Patient Positioning: Upright in bed Baseline Vocal Quality: Normal Volitional Cough: Strong Volitional Swallow: Able to elicit    Oral/Motor/Sensory Function Overall Oral Motor/Sensory Function: Within functional limits   Ice Chips Ice chips: Not tested   Thin Liquid Thin Liquid: Within functional limits Presentation: Cup    Nectar Thick Nectar Thick Liquid: Not tested   Honey Thick Honey Thick Liquid: Not tested   Puree Puree: Within functional limits      Race Latour L. Tivis Ringer, [place] Office number [phone] Pager [phone]  Juan Quam Laurice 10/27/2021,3:55 PM

## 2021-10-27 NOTE — Progress Notes (Signed)
Subjective:   Hospital day: 3  Overnight event: No acute events overnight.   Patient seen at bedside during morning rounds. She reports improvement in her SOB and cough, although notes that she still has a cough and significantly difficulty swallowing. She has an Echo scheduled for later today.   Objective:  Vital signs in last 24 hours: Vitals:   10/27/21 0748 10/27/21 0827 10/27/21 1051 10/27/21 1147  BP: 120/61 120/61 110/69 111/66  Pulse: 79  80 91  Resp:   18 16  Temp: 97.7 F (36.5 C)   97.8 F (36.6 C)  TempSrc: Oral  Oral Oral  SpO2:   94% 93%  Weight:      Height:        Filed Weights   10/25/21 0951 10/26/21 0500 10/27/21 0401  Weight: 80.7 kg 82 kg 79.6 kg     Intake/Output Summary (Last 24 hours) at 10/27/2021 1330 Last data filed at 10/27/2021 1000 Gross per 24 hour  Intake 340 ml  Output 200 ml  Net 140 ml   Net IO Since Admission: -1,270 mL [10/27/21 1330]  Recent Labs    10/26/21 0814 10/27/21 0747  GLUCAP 103* 90     Pertinent Labs: CBC Latest Ref Rng & Units 10/27/2021 10/26/2021 10/25/2021  WBC 4.0 - 10.5 K/uL 6.3 6.5 3.0(L)  Hemoglobin 12.0 - 15.0 g/dL 9.5(L) 9.7(L) 10.8(L)  Hematocrit 36.0 - 46.0 % 30.9(L) 31.5(L) 34.0(L)  Platelets 150 - 400 K/uL 211 222 207   BMP Latest Ref Rng & Units 10/27/2021 10/26/2021 10/25/2021  Glucose 70 - 99 mg/dL 111(H) 140(H) 179(H)  BUN 8 - 23 mg/dL 20 18 8   Creatinine 0.44 - 1.00 mg/dL 0.82 0.74 0.72  BUN/Creat Ratio 12 - 28 - - -  Sodium 135 - 145 mmol/L 137 135 135  Potassium 3.5 - 5.1 mmol/L 3.7 3.7 4.0  Chloride 98 - 111 mmol/L 101 102 100  CO2 22 - 32 mmol/L 29 27 29   Calcium 8.9 - 10.3 mg/dL 8.7(L) 8.6(L) 9.0   Imaging: ECHOCARDIOGRAM COMPLETE  Result Date: 10/27/2021    ECHOCARDIOGRAM REPORT   Patient Name:   JASNOOR TRUSSELL Lipschutz Date of Exam: 10/27/2021 Medical Rec #:  [id number]       Height:       66.0 in Accession #:    [id number]      Weight:       175.5 lb Date of Birth:  10/01/1941        BSA:           1.892 m Patient Age:    19 years        BP:           110/69 mmHg Patient Gender: F               HR:           84 bpm. Exam Location:  Inpatient Procedure: 2D Echo, Cardiac Doppler and Color Doppler Indications:    I48.1 Persistent atrial fibrillation  History:        Patient has prior history of Echocardiogram examinations, most                 recent 08/20/2020. CHF, CAD, Arrythmias:Atrial Fibrillation; Risk                 Factors:Hyperlipidemia and Hypertension.  Sonographer:    Glo Herring Referring Phys: [id number] [place]  1. Left ventricular ejection fraction, by estimation, is 50 to  55%. The left ventricle has low normal function. The left ventricle has no regional wall motion abnormalities. There is mild left ventricular hypertrophy. Left ventricular diastolic parameters are indeterminate.  2. Right ventricular systolic function is normal. The right ventricular size is normal.  3. Left atrial size was mildly dilated.  4. Right atrial size was mildly dilated.  5. The mitral valve is abnormal. Trivial mitral valve regurgitation. No evidence of mitral stenosis.  6. The aortic valve is tricuspid. There is moderate calcification of the aortic valve. Aortic valve regurgitation is trivial. Mild to moderate aortic valve sclerosis/calcification is present, without any evidence of aortic stenosis.  7. The inferior vena cava is normal in size with greater than 50% respiratory variability, suggesting right atrial pressure of 3 mmHg. FINDINGS  Left Ventricle: Left ventricular ejection fraction, by estimation, is 50 to 55%. The left ventricle has low normal function. The left ventricle has no regional wall motion abnormalities. The left ventricular internal cavity size was normal in size. There is mild left ventricular hypertrophy. Left ventricular diastolic parameters are indeterminate. Right Ventricle: The right ventricular size is normal. No increase in right ventricular wall thickness.  Right ventricular systolic function is normal. Left Atrium: Left atrial size was mildly dilated. Right Atrium: Right atrial size was mildly dilated. Pericardium: There is no evidence of pericardial effusion. Mitral Valve: The mitral valve is abnormal. There is moderate thickening of the mitral valve leaflet(s). There is mild calcification of the mitral valve leaflet(s). Mild mitral annular calcification. Trivial mitral valve regurgitation. No evidence of mitral valve stenosis. Tricuspid Valve: The tricuspid valve is normal in structure. Tricuspid valve regurgitation is mild . No evidence of tricuspid stenosis. Aortic Valve: The aortic valve is tricuspid. There is moderate calcification of the aortic valve. Aortic valve regurgitation is trivial. Mild to moderate aortic valve sclerosis/calcification is present, without any evidence of aortic stenosis. Aortic valve mean gradient measures 4.3 mmHg. Aortic valve peak gradient measures 7.8 mmHg. Aortic valve area, by VTI measures 1.39 cm. Pulmonic Valve: The pulmonic valve was normal in structure. Pulmonic valve regurgitation is not visualized. No evidence of pulmonic stenosis. Aorta: The aortic root is normal in size and structure. Venous: The inferior vena cava is normal in size with greater than 50% respiratory variability, suggesting right atrial pressure of 3 mmHg. IAS/Shunts: No atrial level shunt detected by color flow Doppler.  LEFT VENTRICLE PLAX 2D LVIDd:         4.10 cm   Diastology LVIDs:         3.10 cm   LV e' medial:    7.90 cm/s LV PW:         1.20 cm   LV E/e' medial:  11.6 LV IVS:        1.20 cm   LV e' lateral:   7.65 cm/s LVOT diam:     2.00 cm   LV E/e' lateral: 12.0 LV SV:         38 LV SV Index:   20 LVOT Area:     3.14 cm  RIGHT VENTRICLE          IVC RV Basal diam:  3.80 cm  IVC diam: 2.20 cm LEFT ATRIUM             Index        RIGHT ATRIUM           Index LA diam:        3.50 cm 1.85 cm/m   RA  Area:     20.75 cm LA Vol (A2C):   74.3 ml 39.28  ml/m  RA Volume:   58.00 ml  30.66 ml/m LA Vol (A4C):   81.1 ml 42.87 ml/m LA Biplane Vol: 81.3 ml 42.98 ml/m  AORTIC VALVE                    PULMONIC VALVE AV Area (Vmax):    1.39 cm     PV Vmax:       0.82 m/s AV Area (Vmean):   1.35 cm     PV Peak grad:  2.7 mmHg AV Area (VTI):     1.39 cm AV Vmax:           139.33 cm/s AV Vmean:          99.067 cm/s AV VTI:            0.271 m AV Peak Grad:      7.8 mmHg AV Mean Grad:      4.3 mmHg LVOT Vmax:         61.57 cm/s LVOT Vmean:        42.667 cm/s LVOT VTI:          0.120 m LVOT/AV VTI ratio: 0.44  AORTA Ao Root diam: 3.70 cm Ao Asc diam:  3.30 cm MITRAL VALVE               TRICUSPID VALVE MV Area (PHT): 3.03 cm    TR Peak grad:   29.2 mmHg MV Decel Time: 251 msec    TR Vmax:        270.00 cm/s MV E velocity: 91.77 cm/s                            SHUNTS                            Systemic VTI:  0.12 m                            Systemic Diam: 2.00 cm Jenkins Rouge MD Electronically signed by Jenkins Rouge MD Signature Date/Time: 10/27/2021/1:25:39 PM    Final     Physical Exam  General: Well-appearing female laying in bed. No acute distress. CV: Irregularly irregular. No m/r/g. Improved LE edema Pulmonary: Lungs CTAB, normal WOB.  No wheezing or crackles heard  Abdominal: Soft, nontender, nondistended. Normal bowel sounds. Extremities: Lower legs reportedly less tender to light touch today Skin: Warm and dry. No obvious rash or lesions. Neuro: A&Ox3. No focal deficts Psych: Normal mood and affect   Assessment/Plan: [name] [name] is a 80 y.o. female with hx of CAD, chronic afib on Elliquis, HFmrEF, MGUS, and dysphagia presenting with lingering cough and SOB.   Principal Problem:   Acute exacerbation of CHF (congestive heart failure) (HCC) Active Problems:   Permanent atrial fibrillation (HCC)   CAD (coronary artery disease)  #Acute on chronic HRmrEF exacerbation  Echo results notable for improved EF of 50-55% from 45% in 07/2020. Making  good urine output, yesterday -558mL, lungs sounds clear today and minimal LE edema. Cr 0.82 today from 0.74. Cardiology has signed off, appreciate their assistance.  -Continue po lasix 80mg   -Strict I/Os  -Daily weights   #Permanent afib  Patient was ambulated yesterday and HR were stable, ranging from 75-100. Cardiology recommends to  continue regiment of Diltiazem 240mg  qAM and 120mg  qPM.  -Elliquis 5mg  BID -Diltiazem 240mg  qAM and 120mg  qPM  #Dysphagia  Patient has a long-standing history of dysphagia, hiatal hernia, and previous esophagram in 06/2020 showing esophageal spasm. She states she has a barium swallow study scheduled in late Nov and a GI appointment in January. She was observed to have a significant coughing episode and regurgitation while eating breakfast that was concerning for aspiration. We will start a workup of her dysphagia while she is here since her outpatient follow-up is many months away.  -ST to eval  -Consider GI consult here based on ST recs    #GERD -Continue home Protonix 40mg  po daily    #CAD  -Continue home rosuvastatin 10mg  po daily  -Continue home aspirin 81mg  po daily    #Depression  -Continue home Fluoxetine 40mg  po daily and Lunesta nightly for sleep.   Diet: Dysphagia 3 IVF: None VTE: Ellliquis CODE: Full  Prior to Admission Living Arrangement: Home Anticipated Discharge Location: Home Barriers to Discharge: Pending medical stability  Dispo: Anticipated discharge in approximately 1-2 day(s).   Signed: Stefani Dama, Medical Student 10/27/2021, 1:31 PM  Pager: [phone] Internal Medicine Teaching Service After 5pm on weekdays and 1pm on weekends: On Call pager: [phone]

## 2021-10-27 NOTE — Progress Notes (Signed)
Physical Therapy Treatment Patient Details Name: [name] [name] MRN: [id number] DOB: December 21, 1941 Today's Date: 10/27/2021   History of Present Illness Pt is an 80 y.o. female admitted 11/4 for acute CHF exacerbation. She presented to the ED due to cough and SOB. She was diagnosised with COVID in late Sept. 2022. PMH: CAD, chronic afib on Elliquis, CHF, MGUS, and dysphagia    PT Comments    Pt required min assist bed mobility, min guard assist transfers, and min guard assist ambulation 150' with RW. HR 111-127 during amb. Resting HR 90. Pt in recliner with feet elevated at end of session.   Recommendations for follow up therapy are one component of a multi-disciplinary discharge planning process, led by the attending physician.  Recommendations may be updated based on patient status, additional functional criteria and insurance authorization.  Follow Up Recommendations  No PT follow up     Assistance Recommended at Discharge Intermittent Supervision/Assistance  Equipment Recommendations  None recommended by PT    Recommendations for Other Services       Precautions / Restrictions Precautions Precautions: Fall;Other (comment) Precaution Comments: watch HR Restrictions Weight Bearing Restrictions: No     Mobility  Bed Mobility Overal bed mobility: Needs Assistance Bed Mobility: Supine to Sit     Supine to sit: HOB elevated;Min assist     General bed mobility comments: assist to elevate trunk, increased time    Transfers Overall transfer level: Needs assistance Equipment used: Rolling walker (2 wheels) Transfers: Sit to/from Stand Sit to Stand: Min guard Stand pivot transfers: Min guard         General transfer comment: increased time, min guard for safety    Ambulation/Gait Ambulation/Gait assistance: Min guard Gait Distance (Feet): 150 Feet Assistive device: Rolling walker (2 wheels) Gait Pattern/deviations: Step-through pattern;Trunk flexed Gait velocity:  decreased Gait velocity interpretation: <1.31 ft/sec, indicative of household ambulator   General Gait Details: steady gait with RW. HR 111-127 during amb. Resting HR 90.   Stairs             Wheelchair Mobility    Modified Rankin (Stroke Patients Only)       Balance Overall balance assessment: Needs assistance Sitting-balance support: Feet supported;No upper extremity supported Sitting balance-Leahy Scale: Good     Standing balance support: Single extremity supported;Bilateral upper extremity supported;During functional activity Standing balance-Leahy Scale: Fair                              Cognition Arousal/Alertness: Awake/alert Behavior During Therapy: WFL for tasks assessed/performed Overall Cognitive Status: Within Functional Limits for tasks assessed                                          Exercises      General Comments General comments (skin integrity, edema, etc.): SpO2 stable on RA during activity. No SOB/DOE noted.      Pertinent Vitals/Pain Pain Assessment: No/denies pain    Home Living                          Prior Function            PT Goals (current goals can now be found in the care plan section) Acute Rehab PT Goals Patient Stated Goal: home Progress towards PT goals: Progressing toward goals  Frequency    Min 3X/week      PT Plan Current plan remains appropriate    Co-evaluation              AM-PAC PT "6 Clicks" Mobility   Outcome Measure  Help needed turning from your back to your side while in a flat bed without using bedrails?: None Help needed moving from lying on your back to sitting on the side of a flat bed without using bedrails?: A Little Help needed moving to and from a bed to a chair (including a wheelchair)?: A Little Help needed standing up from a chair using your arms (e.g., wheelchair or bedside chair)?: A Little Help needed to walk in hospital room?: A  Little Help needed climbing 3-5 steps with a railing? : A Little 6 Click Score: 19    End of Session Equipment Utilized During Treatment: Gait belt Activity Tolerance: Patient tolerated treatment well Patient left: in chair;with call bell/phone within reach Nurse Communication: Mobility status PT Visit Diagnosis: Difficulty in walking, not elsewhere classified (R26.2)     Time: 6553-7482 PT Time Calculation (min) (ACUTE ONLY): 30 min  Charges:  $Gait Training: 23-37 mins                     Lorrin Goodell, PT  Office # [phone] Pager [phone]    Lorriane Shire 10/27/2021, 10:13 AM

## 2021-10-28 ENCOUNTER — Telehealth: Payer: Self-pay

## 2021-10-28 ENCOUNTER — Inpatient Hospital Stay: Payer: Medicare HMO

## 2021-10-28 LAB — CBC
HCT: 35.6 % — ABNORMAL LOW (ref 36.0–46.0)
Hemoglobin: 11.2 g/dL — ABNORMAL LOW (ref 12.0–15.0)
MCH: 28 pg (ref 26.0–34.0)
MCHC: 31.5 g/dL (ref 30.0–36.0)
MCV: 89 fL (ref 80.0–100.0)
Platelets: 249 10*3/uL (ref 150–400)
RBC: 4 MIL/uL (ref 3.87–5.11)
RDW: 16.1 % — ABNORMAL HIGH (ref 11.5–15.5)
WBC: 6.5 10*3/uL (ref 4.0–10.5)
nRBC: 0 % (ref 0.0–0.2)

## 2021-10-28 LAB — BASIC METABOLIC PANEL
Anion gap: 10 (ref 5–15)
BUN: 21 mg/dL (ref 8–23)
CO2: 28 mmol/L (ref 22–32)
Calcium: 9.2 mg/dL (ref 8.9–10.3)
Chloride: 100 mmol/L (ref 98–111)
Creatinine, Ser: 0.91 mg/dL (ref 0.44–1.00)
GFR, Estimated: >60 mL/min (ref >60)
Glucose, Bld: 142 mg/dL — ABNORMAL HIGH (ref 70–99)
Potassium: 3.9 mmol/L (ref 3.5–5.1)
Sodium: 138 mmol/L (ref 135–145)

## 2021-10-28 LAB — GLUCOSE, CAPILLARY: Glucose-Capillary: 117 mg/dL — ABNORMAL HIGH (ref 70–99)

## 2021-10-28 LAB — MAGNESIUM: Magnesium: 2.1 mg/dL (ref 1.7–2.4)

## 2021-10-28 MED ORDER — DILTIAZEM HCL ER COATED BEADS 120 MG PO CP24: 120.0000 mg | ORAL_CAPSULE | Freq: Every day | ORAL | 0 refills | Status: DC

## 2021-10-28 MED ORDER — DILTIAZEM HCL ER COATED BEADS 240 MG PO CP24: 240.0000 mg | ORAL_CAPSULE | Freq: Every day | ORAL | 0 refills | Status: DC

## 2021-10-28 NOTE — Plan of Care (Signed)

## 2021-10-28 NOTE — Discharge Summary (Signed)
Name: [name] [name] MRN: [id number] DOB: Oct 13, 1941 80 y.o. PCP: [name], DO  Date of Admission: 10/24/2021 10:53 AM Date of Discharge: No discharge date for patient encounter. Attending Physician: Velna Ochs, MD  Discharge Diagnosis: 1. Acute on chronic HFmrEF exacerbation 2. Atrial Fibrillation 3. Dysphagia 4. GERD 5. Depression  Discharge Medications: Allergies as of 10/28/2021       Reactions   Buprenorphine Hcl Itching   Cymbalta [duloxetine Hcl] Other (See Comments)   Other reaction(s): Other (See Comments) Personality changes - crying  2014   Morphine And Related Itching   Oxycodone-acetaminophen Other (See Comments)   Personality changes    Valium [diazepam] Other (See Comments)   Personality changes - "in another world" ;  Hallucinations  2015   Statins Other (See Comments)   Other reaction(s): Other (See Comments) Muscle weakness Muscle weakness   Morphine Itching   Altace [ramipril] Other (See Comments)   unknown   Floxin [ofloxacin] Other (See Comments)   unknown   Hydromorphone Other (See Comments)   Other reaction(s): Confusion (intolerance) unknown   Lovaza [omega-3-acid Ethyl Esters] Other (See Comments)   Muscle weakness    Trilipix [choline Fenofibrate] Other (See Comments)   Muscle weakness    Zetia [ezetimibe] Other (See Comments)   Muscle weakness         Medication List     STOP taking these medications    amoxicillin 400 MG/5ML suspension Commonly known as: AMOXIL   azithromycin 250 MG tablet Commonly known as: Zithromax Z-Pak   diltiazem 120 MG 24 hr capsule Commonly known as: TIAZAC Replaced by: diltiazem 120 MG 24 hr capsule       TAKE these medications    acetaminophen 500 MG tablet Commonly known as: TYLENOL Take 1,000 mg by mouth every 6 (six) hours as needed for headache.   albuterol 108 (90 Base) MCG/ACT inhaler Commonly known as: VENTOLIN HFA Inhale 2 puffs into the lungs every 6 (six)  hours as needed for wheezing or shortness of breath.   aspirin EC 81 MG tablet Take 1 tablet (81 mg total) by mouth daily.   clobetasol cream 0.05 % Commonly known as: TEMOVATE Apply 1 application topically See admin instructions. Applies to vaginal area twice a week.   diltiazem 120 MG 24 hr capsule Commonly known as: CARDIZEM CD Take 1 capsule (120 mg total) by mouth at bedtime. Replaces: diltiazem 120 MG 24 hr capsule   diltiazem 240 MG 24 hr capsule Commonly known as: CARDIZEM CD Take 1 capsule (240 mg total) by mouth daily.   diphenoxylate-atropine 2.5-0.025 MG tablet Commonly known as: LOMOTIL Take 1-2 tablets by mouth 4 (four) times daily as needed for diarrhea or loose stools.   Eliquis 5 MG Tabs tablet Generic drug: apixaban Take 1 tablet (5 mg total) by mouth 2 (two) times daily.   Eszopiclone 3 MG Tabs Take 1 tablet (3 mg total) by mouth at bedtime. For insomnia,take immediately before bedtime. What changed: additional instructions   fentaNYL 50 MCG/HR Commonly known as: DURAGESIC Place 1 patch (50 mcg total) onto the skin every 3 (three) days.   FLUoxetine 40 MG capsule Commonly known as: PROZAC Take 1 capsule (40 mg total) by mouth daily.   furosemide 80 MG tablet Commonly known as: LASIX Take 1 tablet (80 mg total) by mouth daily.   guaiFENesin 100 MG/5ML liquid Commonly known as: ROBITUSSIN Take 5 mLs by mouth every 4 (four) hours as needed for cough or to loosen  phlegm. What changed: how much to take   HYDROcodone-acetaminophen 10-325 MG tablet Commonly known as: NORCO Take 1 tablet by mouth every 4 (four) hours as needed for moderate pain.   Linzess 145 MCG Caps capsule Generic drug: linaclotide Take 145 mcg by mouth daily as needed (constipation).   loperamide 2 MG tablet Commonly known as: IMODIUM A-D Take 4 mg by mouth 4 (four) times daily as needed for diarrhea or loose stools.   multivitamin tablet Take 1 tablet by mouth daily.    nitroGLYCERIN 0.4 MG SL tablet Commonly known as: Nitrostat Place 1 tablet (0.4 mg total) under the tongue every 5 (five) minutes as needed for chest pain (up to 3 doses). What changed:  when to take this reasons to take this   NONFORMULARY OR COMPOUNDED ITEM Apply 1 application topically daily. Antifungal solution: Terbinafine 3%, Fluconazole 2%, Tea Tree Oil 5%, Urea 10%, Ibuprofen 2% in DMSO suspension #45mL   pantoprazole 40 MG tablet Commonly known as: PROTONIX Take 1 tablet (40 mg total) by mouth 2 (two) times daily.   potassium chloride 10 MEQ tablet Commonly known as: KLOR-CON Take 10 mEq by mouth daily.   rosuvastatin 10 MG tablet Commonly known as: CRESTOR Take 5 mg alternating with 10 mg daily for cholesterol What changed:  how much to take how to take this when to take this   triamcinolone cream 0.1 % Commonly known as: KENALOG Apply 1 application topically 3 (three) times daily as needed (rash).   Vitamin D 50 MCG (2000 UT) tablet Take 2,000 Units by mouth daily.        Disposition and follow-up:   Ms.[name] [name] was discharged from [facility] in Stable condition.  At the hospital follow up visit please address:  1.   Acute on chronic HFmrEF exacerbation In the setting of possible community-acquired pneumonia and A. fib with RVR.  Effective volume removal with IV Lasix.  -Continue p.o. Lasix 80 mg daily -Follow-up with cardiology  Atrial Fibrillation Her diltiazem was increased to 240 mg in the morning and 120 mg in the afternoon -Ensure adherence to new dose -Follow-up with cardiology  Dysphagia Speech therapy evaluated patient and did not recommend any urgent barium swallow study. -Follow-up with her scheduled barium swallow study on 11/29  2.  Labs / imaging needed at time of follow-up: NA  3.  Pending labs/ test needing follow-up: BMP  Follow-up Appointments:  Please follow-up with your PCP in 1-2 weeks. Your barium  swallow study is scheduled for 11/29 at 10:15 AM. Please also make a follow-up appointment with your cardiologists, Dr. Burt Knack and Richardson Dopp.   Hospital Course by problem list: Patient Summary  Mrs. [name] [name] is a 80 y.o. female with a history of CAD, chronic afib on Elliquis, CHF, MGUS, and dysphagia who presents to the ED due to lingering cough and SOB. Hospital course is outlined below.   ED Course Patient arrived at the ED complaining of ongoing cough and SOB, although noted it had been improving for the last 3 days since starting antibiotic treatment. She returned to PCP's office on 11/4 and was noted to have O2 sat of 91% on room air with CXR notable for L pleural effusion. She was initially treated for COVID pneumonia, but then it was noted that she had tested positive for COVID in late September and that the positive in the ED was a residual result.   #Acute CHF exacerbation  Patient originally diagnosed with CAP  by her PCP but upon initial evaluation was well appearing, has normal WBC of 6, and was afebrile with O2 sat of 95% on room air. Completed 5 day course of amoxicillin and azithromycin during her stay. Given that she has noticed worsening LE edema, SOB, and had significantly elevated JVP, she was diagnosed with acute on chronic exacerbation of her HFmrEF, potentially due to decreased adherence to Lasix, afib with RVR, or community-acquired pneumonia. BNP 270 suggestive of acute CHF exacerbation, and troponins were flat x2, ruling out ACS. Patient was diuresed with daily Lasix 80mg  which she responded well to. Her cough and SOB continued to improve and she did not require any supplemental O2 during her stay. Cardiology recommended an updated Echo, which patient underwent on 11/7. Echo results were notable for improved EF of 50-55%.  Her p.o. Lasix 80 mg was resumed on discharge.  #Permanent Afib  Patient's heart rate was noted to increase to the 170s when she walked on 11/5 even  after resuming her diltiazem 120mg  BID. Cardiology was consulted in order to assist with management of tachycardia. Patient's diltiazem was increased to 240mg  po qAM and 120mg  po qPM on 11/6. She was continued on Eliquis 5 mg twice daily. She was ambulated again on 11/6 and her HR ranged from 75-100. Patient was discharged on Diltiazem 240mg  qAM and 120mg  qPM.   #Anemia Patient was scheduled to receive an outpatient IV iron infusion on 11/8 after her PCP ordered an iron panel notable for ferritin of 3. For convenience purposes, she received an IV iron infusion on 11/7 with Venofer, which she tolerated well. At time of discharge, her hemoglobin was 11.2.   #Dysphagia  The etiology is likely due to her tortuous esophagus on EGD in 2018.  Patient was placed on dysphagia 3 diet during her stay. Speech therapy was consulted to evaluate patient's dysphagia and recommended regular solids and thin liquids, as well as crushed medication administration with puree. Patient has an upcoming barium swallow study scheduled on 11/29 with Dr. Dareen Piano at [facility].  #Insomnia Patient takes Lunesta 3mg  at home to help her sleep, which is not available here. She was offered Ambien but it did not help. On her second night, she was able to take her home Lunesta and slept well. 11 pills from her Johnnye Sima were accidentally dropped and disposed of on 11/5.  HPI Patient is seen at bedside.  She appears comfortable in no acute respiratory distress.  Reports feeling well.  Denies chest pain or shortness of breath.  Patient is agreeable with going home.  Discharge Exam:   BP (!) 110/54   Pulse 81   Temp 97.7 F (36.5 C) (Oral)   Resp 15   Ht 5\' 6"  (1.676 m)   Wt 79.9 kg   SpO2 91%   BMI 28.43 kg/m  Discharge exam:  General: Alert, lying in bed, NAD CV: Irregularly irregular rhythm, normal S1 and S2, no murmurs heard Pulm: Lungs CTAB, mildly increased WOB, no crackles heard  Abdominal: Soft, non-distended  and non-tender to palpation. Normal bowel sounds.  MSK: Mild tenderness to palpation of lower extremities, no edema  Neuro: A&Ox3, no focal deficits  Psych: Appropriate behavior and affect   Pertinent Labs, Studies, and Procedures:  DG Chest 2 View  Result Date: 10/24/2021 CLINICAL DATA:  PNA EXAM: CHEST - 2 VIEW COMPARISON:  October 21, 2021 FINDINGS: The cardiomediastinal silhouette is unchanged in contour.Atherosclerotic calcifications. Small LEFT pleural effusion. No pneumothorax. Unchanged LEFT basilar opacity.  Visualized abdomen is unremarkable. Extensive posterior fixation of the thoracolumbar spine. IMPRESSION: Stable chest radiograph. Electronically Signed   By: Valentino Saxon M.D.   On: 10/24/2021 11:36     Echocardiogram  IMPRESSIONS   1. Left ventricular ejection fraction, by estimation, is 50 to 55%. The  left ventricle has low normal function. The left ventricle has no regional  wall motion abnormalities. There is mild left ventricular hypertrophy.  Left ventricular diastolic  parameters are indeterminate.   2. Right ventricular systolic function is normal. The right ventricular  size is normal.   3. Left atrial size was mildly dilated.   4. Right atrial size was mildly dilated.   5. The mitral valve is abnormal. Trivial mitral valve regurgitation. No  evidence of mitral stenosis.   6. The aortic valve is tricuspid. There is moderate calcification of the  aortic valve. Aortic valve regurgitation is trivial. Mild to moderate  aortic valve sclerosis/calcification is present, without any evidence of  aortic stenosis.   7. The inferior vena cava is normal in size with greater than 50%  respiratory variability, suggesting right atrial pressure of 3 mmHg.   Discharge Instructions: Discharge Instructions     Call MD for:  persistant nausea and vomiting   Complete by: As directed    Call MD for:  redness, tenderness, or signs of infection (pain, swelling, redness, odor  or green/yellow discharge around incision site)   Complete by: As directed    Call MD for:  severe uncontrolled pain   Complete by: As directed    Call MD for:  temperature >100.4   Complete by: As directed    Diet - low sodium heart healthy   Complete by: As directed    Discharge instructions   Complete by: As directed    [name],  It was a pleasure taking care of you during this admission.  You were hospitalized for fluid in your lungs and swelling of your legs.  You were treated with IV Lasix for fluid removal.  The echocardiogram showed stable heart function.  We resume your home oral Lasix 80 mg at discharge.  We also noted that your heart rate was very elevated to the 150s when you are walking.  Your Diltiazem was increased to 240 mg in the morning and 120 mg in the afternoon.  Please continue the new dosage.  You also received 1 dose of IV iron during this admission.  Please follow-up with your primary doctor and cardiologist.  (For PCP: 11 tablets of Lunesta were wasted during this admission. RN dropped the pills on the floor during counting and were disposed properly under witness of another RN.)  Take care,  Dr. Alfonse Spruce   Increase activity slowly   Complete by: As directed        Signed: France Ravens, MD 10/28/2021, 11:16 AM   Pager: [phone]

## 2021-10-28 NOTE — Telephone Encounter (Signed)
**Note De-Identified  Obfuscation** The pt is currently in [name]. We will monitor her chart and will call her once she has been discharged.

## 2021-10-28 NOTE — Telephone Encounter (Signed)
**Note De-identified  Obfuscation** -----  **Note De-Identified  Obfuscation** Message from Cheryln Manly, NP sent at 10/28/2021  7:04 AM EST ----- Regarding: Follow up Needs TOC call

## 2021-10-29 ENCOUNTER — Telehealth: Payer: Self-pay

## 2021-10-29 NOTE — Telephone Encounter (Signed)
**Note De-Identified  Obfuscation** Transition Care Management Unsuccessful Follow-up Telephone Call  Date of discharge and from where: 10/28/2021  Attempts:  1st Attempt  Reason for unsuccessful TCM follow-up call:  Left voice message The pt did answer my call but stated that she was resting at home alone and requested that I call her daughter [name] Sens (DPR) with TOC questions.  I called [name] but got no answer so I left a message on her VM asking her to call Jeani Hawking at [facility] at [phone] concerning her mothers hospital d/d on 10/28/21.

## 2021-10-29 NOTE — Telephone Encounter (Signed)
Transition Care Management Follow-up Telephone Call Date of discharge and from where: 10/28/2021 from [facility]  Diagnosis: acute exacerbation of CHF How have you been since you were released from the hospital? Stable, just still has a nasty cough and a little SOB on exertion Any questions or concerns? No  Items Reviewed: Did the pt receive and understand the discharge instructions provided? Yes  Medications obtained and verified? Yes  Other? No  Any new allergies since your discharge? No  Dietary orders reviewed? Yes Do you have support at home? Yes   Home Care and Equipment/Supplies: Were home health services ordered? no If so, what is the name of the agency? N/a  Has the agency set up a time to come to the patient's home? not applicable Were any new equipment or medical supplies ordered?  No What is the name of the medical supply agency? N/a Were you able to get the supplies/equipment? not applicable Do you have any questions related to the use of the equipment or supplies? No  Functional Questionnaire: (I = Independent and D = Dependent) ADLs: I  Bathing/Dressing- I  Meal Prep- I  Eating- I  Maintaining continence- I  Transferring/Ambulation- I  Managing Meds- I  Follow up appointments reviewed:  PCP Hospital f/u appt confirmed? No  She declined TCM appt - has appts with cardiology and for barium swallow - already has routine appt scheduled with Dr Lajuana Ripple 11/28/21 and doesn't want to change it [facility] f/u appt confirmed? Yes  Scheduled to see cardiology on 11/29 @ 11:45, iron infusion 11/15 @ 11:45, and for barium swallow 11/29 @ 10:15 Are transportation arrangements needed? No  If their condition worsens, is the pt aware to call PCP or go to the Emergency Dept.? Yes Was the patient provided with contact information for the PCP's office or ED? Yes Was to pt encouraged to call back with questions or concerns? Yes

## 2021-10-30 ENCOUNTER — Telehealth: Payer: Self-pay | Admitting: Hematology

## 2021-10-30 NOTE — Telephone Encounter (Signed)
**Note De-Identified  Obfuscation** Patient daughter and DPR, [name] contacted regarding thepts discharge from [facility] on 10/28/2021.  [name] states that the pt cannot keep the hospital f/u appointment with Ermalinda Barrios, PA-c that is scheduled for 11/18/2021 at 11:45 as she states that they were unaware of this appt and that the pt has an appt schedule at [facility] on that day that she cannot miss.  She is aware that I am forwarding this message to our scheduling team and that they will be contacting her at [phone] to re-schedule the pts hospital f/u   Patient understands discharge instructions? Yes Patient understands medications and regiment? Yes Patient understands to bring all medications to this visit? Yes  Ask patient:  Are you enrolled in My Chart: Yes  [name] reports that the pt is doing ok but seems a little weak but that she has not c/o CP/discomfort, SOB, dizziness, nausea, diaphoresis, or syncope.  [name] states that she does have Dr York Cerise phone number at [facility] to call us If they have any questions or concerns.  She thanked me for calling to check on her Mom.

## 2021-10-30 NOTE — Telephone Encounter (Signed)
Moved 11/15 appointment time per 11/10 scheduling message, patient's daughter has been called and notified.

## 2021-10-30 NOTE — Telephone Encounter (Signed)
  Pt's daughter returning call, she said is on her way to her doctor's appt and just leave her a detailed message if she couldn't answer there phone

## 2021-11-04 ENCOUNTER — Inpatient Hospital Stay: Payer: Medicare HMO

## 2021-11-04 ENCOUNTER — Inpatient Hospital Stay: Payer: Medicare HMO | Attending: Hematology

## 2021-11-04 ENCOUNTER — Other Ambulatory Visit: Payer: Self-pay

## 2021-11-04 VITALS — BP 115/48 | HR 69 | Temp 98.0°F | Resp 17

## 2021-11-04 DIAGNOSIS — D472 Monoclonal gammopathy: Secondary | ICD-10-CM | POA: Diagnosis present

## 2021-11-04 DIAGNOSIS — D500 Iron deficiency anemia secondary to blood loss (chronic): Secondary | ICD-10-CM

## 2021-11-04 DIAGNOSIS — D509 Iron deficiency anemia, unspecified: Secondary | ICD-10-CM | POA: Diagnosis not present

## 2021-11-04 MED ORDER — SODIUM CHLORIDE 0.9 % IV SOLN: Freq: Once | INTRAVENOUS | Status: AC

## 2021-11-04 MED ORDER — LORATADINE 10 MG PO TABS
10.0000 mg | ORAL_TABLET | Freq: Every day | ORAL | Status: DC
  Administered 2021-11-04: 10 mg via ORAL
  Filled 2021-11-04: qty 1

## 2021-11-04 MED ORDER — SODIUM CHLORIDE 0.9 % IV SOLN
400.0000 mg | Freq: Once | INTRAVENOUS | Status: AC
  Administered 2021-11-04: 400 mg via INTRAVENOUS
  Filled 2021-11-04: qty 20

## 2021-11-04 NOTE — Patient Instructions (Signed)

## 2021-11-16 NOTE — Progress Notes (Signed)
Cardiology Office Note:    Date:  11/17/2021   ID:  [name] [name], DOB Jun 11, 1941, MRN [id number]  PCP:  [name], DO   [facility] Providers Cardiologist:  Sherren Mocha, MD Cardiology APP:  Liliane Shi, PA-C  Electrophysiologist:  Vickie Epley, MD      Referring MD: [name], DO   Presents to the clinic today for follow-up evaluation of her atrial fibrillation, coronary artery disease, and hypertension.  History of Present Illness:    [name] [name] is a 80 y.o. female with a hx of coronary artery disease (complex bifurcation distal LAD/ostial circumflex disease, felt to be poor candidate for CABG) underwent PCI with DES to the lesion 97/74, chronic diastolic CHF, permanent atrial fibrillation on Eliquis, prior CVA, HTN with prior orthostatic hypotension, multiple falls, hyperlipidemia, hepatic steatosis, and chronic back pain.  She was seen and evaluated in the hospital with atrial fibrillation with RVR.  She was admitted on 10/24/2021 and discharged on 10/28/2021.  Her heart rate was elevated and this was felt to be related to COVID-19.  Her Cardizem was increased to 240 mg in the a.m. and 120 mg in p.m.  Her heart rate stabilized in the 60-70 bpm range.  Her echocardiogram 10/27/2021 showed an LVEF of 50-55% mild LVH, and intermediate diastolic parameters.  She was also noted to have mild dilation of her left and right atria.  Trivial mitral valve regurgitation was noted and mild-moderate aortic valve sclerosis without stenosis was also noted.  She was discharged in stable condition on 10/28/2021.  She presents the clinic today for follow-up evaluation states her heart rate has been much better since her increase in diltiazem.  She presents to the clinic today with her daughter who reports that she has trouble with her swallowing.  She will present to [facility] tomorrow for a barium swallow study.  We reviewed the cough that she has and  talked about expectations for having a cough possibly up to 8 weeks after pneumonia.  She expressed understanding.  Some of her cough may also be from her reflux.  She is fairly sedentary at home.  I have asked her to do coughing and deep breathing exercises as well as chair exercises which I will provide.  We will plan follow-up for 3 to 4 months and I will refill her sublingual nitroglycerin as well as her diltiazem 240 mg.  We will give her the salty 6 diet sheet as well.  Today she denies chest pain, increased shortness of breath, increased lower extremity edema, fatigue, palpitations, melena, hematuria, hemoptysis, diaphoresis, and weakness. 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 Abnl nuc 11/2014. Cath 12/2014 - turned down for CABG. Ultimately s/p TTVP, rotational atherectomy, PTCA and stenting of the ostial LCx and left main into the LAD (crush technique), and IVUS of the LAD/Left main. // b. Myoview 11/17: EF 48, poor quality/significant artifact; inf-lateral, inferior ischemia; Intermediate Risk   Chronic atrial fibrillation ([place])    a.  First noted post-op 9/15 spinal fusion.  She had cardioversion, not on anticoagulation. Fall risk, unsteady. // failed DCCV // Holter 10/17: AFib, Avg HR 97, PVCs,  no other arrhythmia   Chronic back pain greater than 3 months duration    a. spinal stenosis.  Spinal fusion with rods in 2/15 at [facility] spinal fusion 9/15.   Chronic diastolic CHF    Echo 1/96:  EF 50-55%, trivial AI, midl MR, mod LAE, PASP 37 mmHg // Echocardiogram 03/2020: EF 45-50, no RWMA, mild LVH, mild reduced RVSF, mildly elevated PASP (RVSP 38.3), mild LAE, mild MR, mild to mod TR, trivial AI // Echo 9/21: EF 45, mild  LVH, mildly reduced RV SF, moderate LAE, mild RAE, mild MR, mild AI      Circadian rhythm sleep disorder    CTS (carpal tunnel syndrome)    Deficiency anemia 11/10/2014   Depression    Diarrhea    Diverticulitis of colon    Esophageal stricture    Gastritis    Gastroesophageal hernia 07/06/2013   GERD (gastroesophageal reflux disease)    Hiatal hernia    History of blood transfusion    "most of them related to OR's"    HTN (hypertension)    Hyperlipidemia    IBS (irritable bowel syndrome)    Incontinence 10/13/2012   Insomnia    Ischemic chest pain ([place])    Memory disorder 22/29/7989   Metabolic syndrome 21/19/4174   MGUS (monoclonal gammopathy of unknown significance) dx'd 11/2014   a. Neg BMB 11/2014.   Multiple falls    Obesity    Obstructive sleep apnea    "have mask; don't wear it" (01/07/2015)   Orthostasis    PAT (paroxysmal atrial tachycardia) ([place])    Personal history of colonic polyps 10/25/2011 & 12/02/11   not retrieved Dr Lyla Son & tubular adenomas   Pneumonia 03/2014   Sinus bradycardia    a. Baseline HR 50s-60s.   Stroke [facility]) early 2000's   "small"; denies residual on 01/07/2015)    Past Surgical History:  Procedure Laterality Date   BACK SURGERY     BONE MARROW BIOPSY  11/2014   CARDIAC CATHETERIZATION  12/27/2014   Procedure: INTRAVASCULAR PRESSURE WIRE/FFR STUDY;  Surgeon: Larey Dresser, MD;  Location: [facility] CATH LAB;  Service: Cardiovascular;;   CARDIAC CATHETERIZATION N/A 11/25/2016   Procedure: Left Heart Cath and Coronary Angiography;  Surgeon: Sherren Mocha, MD;  Location: [place] CV LAB;  Service: Cardiovascular;  Laterality: N/A;   CARDIOVERSION N/A 02/13/2016   Procedure: CARDIOVERSION;  Surgeon: Josue Hector, MD;  Location: [place];  Service: Cardiovascular;  Laterality: N/A;   CARPAL TUNNEL RELEASE Right 1980's   CATARACT EXTRACTION Bilateral    CATARACT EXTRACTION W/ INTRAOCULAR LENS  IMPLANT, BILATERAL  2000's   CORONARY  ANGIOPLASTY WITH STENT PLACEMENT  01/07/2015   "2"   CORONARY STENT PLACEMENT     DILATION AND CURETTAGE OF UTERUS     ESOPHAGOGASTRODUODENOSCOPY (EGD) WITH ESOPHAGEAL DILATION  "several times"   JOINT REPLACEMENT     KNEE ARTHROSCOPY Left 1995   LAPAROSCOPIC CHOLECYSTECTOMY  2003   LEFT HEART CATHETERIZATION WITH CORONARY ANGIOGRAM N/A 12/27/2014   Procedure: LEFT HEART CATHETERIZATION WITH CORONARY ANGIOGRAM;  Surgeon: Larey Dresser, MD;  Location: [facility] CATH LAB;  Service: Cardiovascular;  Laterality: N/A;   PERCUTANEOUS CORONARY ROTOBLATOR INTERVENTION (PCI-R)  01/07/2015   PERCUTANEOUS CORONARY ROTOBLATOR INTERVENTION (PCI-R) N/A 01/07/2015   Procedure: PERCUTANEOUS CORONARY ROTOBLATOR INTERVENTION (PCI-R);  Surgeon: Blane Ohara, MD;  Location: [facility] CATH LAB;  Service: Cardiovascular;  Laterality: N/A;   POSTERIOR FUSION THORACIC SPINE  08/2015   POSTERIOR LUMBAR FUSION  01/2015   TOTAL KNEE ARTHROPLASTY Bilateral 1990's - 2000's    Current Medications: Current Meds  Medication Sig   albuterol (VENTOLIN HFA) 108 (90 Base) MCG/ACT inhaler Inhale 2 puffs into the lungs every 6 (six) hours as needed for wheezing or shortness of breath.   aspirin EC 81 MG tablet Take 1 tablet (81 mg total) by mouth daily.   Cholecalciferol (VITAMIN D) 2000 units tablet Take 2,000 Units by mouth daily.   clobetasol cream (TEMOVATE) 7.12 % Apply 1 application topically See admin instructions. Applies to vaginal area twice a week.   diltiazem (CARDIZEM CD) 120 MG 24 hr capsule Take 1 capsule (120 mg total) by mouth at bedtime.   diltiazem (CARDIZEM CD) 240 MG 24 hr capsule Take 1 capsule (240 mg total) by mouth daily.   diphenoxylate-atropine (LOMOTIL) 2.5-0.025 MG tablet Take 1-2 tablets by mouth 4 (four) times daily as needed for diarrhea or loose stools.   ELIQUIS 5 MG TABS tablet Take 1 tablet (5 mg total) by mouth 2 (two) times daily.   Eszopiclone 3 MG TABS Take 1 tablet (3 mg total) by mouth at bedtime.  For insomnia,take immediately before bedtime. (Patient taking differently: Take 3 mg by mouth at bedtime.)   fentaNYL (DURAGESIC - DOSED MCG/HR) 50 MCG/HR Place 1 patch (50 mcg total) onto the skin every 3 (three) days.   FLUoxetine (PROZAC) 40 MG capsule Take 1 capsule (40 mg total) by mouth daily.   furosemide (LASIX) 80 MG tablet Take 1 tablet (80 mg total) by mouth daily.   guaiFENesin (ROBITUSSIN) 100 MG/5ML liquid Take 5 mLs by mouth every 4 (four) hours as needed for cough or to loosen phlegm. (Patient taking differently: Take 100 mLs by mouth every 4 (four) hours as needed for cough or to loosen phlegm.)   HYDROcodone-acetaminophen (NORCO) 10-325 MG per tablet Take 1 tablet by mouth every 4 (four) hours as needed for moderate pain.    LINZESS 145 MCG CAPS capsule Take 145 mcg by mouth daily as needed (constipation).   loperamide (IMODIUM A-D) 2 MG tablet Take 4 mg by mouth 4 (four) times daily as needed for diarrhea or loose stools.   Multiple Vitamin (MULTIVITAMIN) tablet Take 1 tablet by mouth daily.   nitroGLYCERIN (NITROSTAT) 0.4 MG SL tablet Place 1 tablet (0.4 mg total) under the tongue every 5 (five) minutes as needed for chest pain (up to 3 doses). (Patient taking differently: Place 0.4 mg under the tongue every 5 (five) minutes x 3 doses as needed for chest pain.)   NONFORMULARY OR COMPOUNDED ITEM Apply 1 application topically daily. Antifungal solution: Terbinafine 3%, Fluconazole 2%, Tea Tree Oil 5%, Urea 10%, Ibuprofen 2% in DMSO suspension #34m   pantoprazole (PROTONIX) 40 MG tablet Take 1 tablet (40 mg total) by mouth 2 (two) times daily.   potassium chloride (KLOR-CON) 10 MEQ tablet Take 10 mEq by mouth daily.   rosuvastatin (CRESTOR) 10 MG tablet Take 5 mg alternating with 10 mg daily for cholesterol (Patient taking differently: Take 5-10 mg by mouth See admin instructions. Take 5 mg alternating with 10 mg daily for cholesterol)   triamcinolone cream (KENALOG) 0.1 % Apply 1  application topically 3 (three) times daily as needed (rash).     Allergies:   Buprenorphine hcl, Cymbalta [duloxetine hcl], Morphine and related, Oxycodone-acetaminophen, Valium [diazepam], Statins, Morphine, Altace [ramipril], Floxin [ofloxacin], Hydromorphone, Lovaza [omega-3-acid ethyl esters], Trilipix [choline fenofibrate], and Zetia [ezetimibe]   Social History   Socioeconomic History  Marital status: Widowed    Spouse name: Not on file   Number of children: 2   Years of education: HS   Highest education level: Not on file  Occupational History   Occupation: Licensed conveyancer- retired    Comment: retired  Tobacco Use   Smoking status: Never   Smokeless tobacco: Never  Vaping Use   Vaping Use: Never used  Substance and Sexual Activity   Alcohol use: No    Comment: 01/07/2015 "glass of wine at Christmas, maybe"   Drug use: No   Sexual activity: Never  Other Topics Concern   Not on file  Social History Narrative   Patient is right handed   Patient drinks 1-2 sodas daily.   patlient lives alone.   Social Determinants of Health   Financial Resource Strain: Not on file  Food Insecurity: Not on file  Transportation Needs: Not on file  Physical Activity: Not on file  Stress: Not on file  Social Connections: Not on file     Family History: The patient's family history includes Colon cancer in her sister and sister; Coronary artery disease in her brother, father, and mother; Emphysema in her brother and sister; Peripheral vascular disease in her father; Sleep apnea in her son. There is no history of Dementia.  ROS:   Please see the history of present illness.     All other systems reviewed and are negative.   Risk Assessment/Calculations:    CHA2DS2-VASc Score = 5   This indicates a 7.2% annual risk of stroke. The patient's score is based upon: CHF History: 1 HTN History: 0 Diabetes History: 0 Stroke History: 0 Vascular Disease History: 1 Age Score: 2 Gender Score:  1          Physical Exam:    VS:  BP 100/60   Pulse 83   Ht _0  (1.676 m)   Wt 174 lb (78.9 kg)   BMI 28.08 kg/m     Wt Readings from Last 3 Encounters:  11/17/21 174 lb (78.9 kg)  10/28/21 176 lb 2.4 oz (79.9 kg)  10/24/21 173 lb (78.5 kg)     GEN:  Well nourished, well developed in no acute distress HEENT: Normal NECK: No JVD; No carotid bruits LYMPHATICS: No lymphadenopathy CARDIAC: Irregularly irregular, no murmurs, rubs, gallops generalized ,bilateral lower extremity edema. RESPIRATORY:  Clear to auscultation without rales, wheezing or rhonchi  ABDOMEN: Soft, non-tender, non-distended MUSCULOSKELETAL:   No deformity  SKIN: Warm and dry NEUROLOGIC:  Alert and oriented x 3 PSYCHIATRIC:  Normal affect    EKGs/Labs/Other Studies Reviewed:    The following studies were reviewed today:  Echocardiogram 10/27/2021  IMPRESSIONS     1. Left ventricular ejection fraction, by estimation, is 50 to 55%. The  left ventricle has low normal function. The left ventricle has no regional  wall motion abnormalities. There is mild left ventricular hypertrophy.  Left ventricular diastolic  parameters are indeterminate.   2. Right ventricular systolic function is normal. The right ventricular  size is normal.   3. Left atrial size was mildly dilated.   4. Right atrial size was mildly dilated.   5. The mitral valve is abnormal. Trivial mitral valve regurgitation. No  evidence of mitral stenosis.   6. The aortic valve is tricuspid. There is moderate calcification of the  aortic valve. Aortic valve regurgitation is trivial. Mild to moderate  aortic valve sclerosis/calcification is present, without any evidence of  aortic stenosis.   7. The inferior vena  cava is normal in size with greater than 50%  respiratory variability, suggesting right atrial pressure of 3 mmHg.   EKG:  EKG is  ordered today.  The ekg ordered today demonstrates atrial fibrillation 83 bpm  Recent  Labs: 06/02/2021: TSH 2.520 10/13/2021: ALT 21 10/24/2021: B Natriuretic Peptide 270.0 10/28/2021: BUN 21; Creatinine, Ser 0.91; Hemoglobin 11.2; Magnesium 2.1; Platelets 249; Potassium 3.9; Sodium 138  Recent Lipid Panel    Component Value Date/Time   CHOL 138 07/21/2021 1557   CHOL 156 05/11/2013 1536   TRIG 81 10/24/2021 1420   TRIG 121 02/18/2016 1504   TRIG 239 (H) 05/11/2013 1536   HDL 70 07/21/2021 1557   HDL 57 02/18/2016 1504   HDL 43 05/11/2013 1536   CHOLHDL 2.0 07/21/2021 1557   LDLCALC 47 07/21/2021 1557   LDLCALC 50 08/21/2014 1141   LDLCALC 65 05/11/2013 1536    ASSESSMENT & PLAN    Atrial fibrillation-heart rate today 83 .  Denies episodes of increased heart rate since being discharged from the hospital.  Tolerating her diltiazem well.  Reports compliance with apixaban and denies bleeding issues. Continue diltiazem, Eliquis Heart healthy low-sodium diet-salty 6 given Increase physical activity as tolerated Refill diltiazem  Coronary artery disease-no recent episodes of arm neck back or chest discomfort. Continue aspirin, rosuvastatin, nitroglycerin as needed Heart healthy low-sodium diet-salty 6 given Increase physical activity as tolerated-chair exercises given  COVID-19 infection-feels she is slowly recovering.  Slowly increasing physical activity. Follows with PCP  Disposition: Follow-up with Dr. Burt Knack or me in 3-4 months.       Medication Adjustments/Labs and Tests Ordered: Current medicines are reviewed at length with the patient today.  Concerns regarding medicines are outlined above.  No orders of the defined types were placed in this encounter.  No orders of the defined types were placed in this encounter.   There are no Patient Instructions on file for this visit.   Signed, Deberah Pelton, NP  11/17/2021 10:50 AM      Notice: This dictation was prepared with Dragon dictation along with smaller phrase technology. Any transcriptional  errors that result from this process are unintentional and may not be corrected upon review.  I spent 14 minutes examining this patient, reviewing medications, and using patient centered shared decision making involving her cardiac care.  Prior to her visit I spent greater than 20 minutes reviewing her past medical history,  medications, and prior cardiac tests.

## 2021-11-17 ENCOUNTER — Encounter (HOSPITAL_BASED_OUTPATIENT_CLINIC_OR_DEPARTMENT_OTHER): Payer: Self-pay | Admitting: General Practice

## 2021-11-17 ENCOUNTER — Other Ambulatory Visit: Payer: Self-pay

## 2021-11-17 ENCOUNTER — Ambulatory Visit (HOSPITAL_BASED_OUTPATIENT_CLINIC_OR_DEPARTMENT_OTHER): Payer: Medicare HMO | Admitting: General Practice

## 2021-11-17 VITALS — BP 100/60 | HR 83 | Ht 66.0 in | Wt 174.0 lb

## 2021-11-17 DIAGNOSIS — I4821 Permanent atrial fibrillation: Secondary | ICD-10-CM | POA: Diagnosis not present

## 2021-11-17 DIAGNOSIS — U071 COVID-19: Secondary | ICD-10-CM | POA: Diagnosis not present

## 2021-11-17 DIAGNOSIS — I2510 Atherosclerotic heart disease of native coronary artery without angina pectoris: Secondary | ICD-10-CM | POA: Diagnosis not present

## 2021-11-17 MED ORDER — DILTIAZEM HCL ER COATED BEADS 240 MG PO CP24: 240.0000 mg | ORAL_CAPSULE | Freq: Every day | ORAL | 0 refills | Status: DC

## 2021-11-17 MED ORDER — NITROGLYCERIN 0.4 MG SL SUBL: 0.4000 mg | SUBLINGUAL_TABLET | SUBLINGUAL | 3 refills | Status: DC | PRN

## 2021-11-17 NOTE — Addendum Note (Signed)
Addended by: Gerald Stabs on: 11/17/2021 11:04 AM   Modules accepted: Orders

## 2021-11-17 NOTE — Patient Instructions (Signed)

## 2021-11-17 NOTE — Addendum Note (Signed)
Addended by: Ernie Hew D on: 11/17/2021 04:03 PM   Modules accepted: Orders

## 2021-11-18 ENCOUNTER — Ambulatory Visit: Payer: Medicare HMO | Admitting: Physician Assistant

## 2021-11-26 ENCOUNTER — Ambulatory Visit (INDEPENDENT_AMBULATORY_CARE_PROVIDER_SITE_OTHER): Payer: Medicare HMO

## 2021-11-26 ENCOUNTER — Encounter: Payer: Self-pay | Admitting: Family Medicine

## 2021-11-26 ENCOUNTER — Ambulatory Visit: Payer: Medicare HMO | Admitting: Family Medicine

## 2021-11-26 VITALS — BP 143/73 | HR 140 | Temp 97.9°F | Ht 66.0 in | Wt 176.8 lb

## 2021-11-26 DIAGNOSIS — I4821 Permanent atrial fibrillation: Secondary | ICD-10-CM | POA: Diagnosis not present

## 2021-11-26 DIAGNOSIS — F32A Depression, unspecified: Secondary | ICD-10-CM

## 2021-11-26 DIAGNOSIS — R1310 Dysphagia, unspecified: Secondary | ICD-10-CM | POA: Diagnosis not present

## 2021-11-26 DIAGNOSIS — Z8701 Personal history of pneumonia (recurrent): Secondary | ICD-10-CM

## 2021-11-26 DIAGNOSIS — I2510 Atherosclerotic heart disease of native coronary artery without angina pectoris: Secondary | ICD-10-CM | POA: Diagnosis not present

## 2021-11-26 DIAGNOSIS — D500 Iron deficiency anemia secondary to blood loss (chronic): Secondary | ICD-10-CM

## 2021-11-26 DIAGNOSIS — F419 Anxiety disorder, unspecified: Secondary | ICD-10-CM

## 2021-11-26 MED ORDER — ELIQUIS 5 MG PO TABS: 5.0000 mg | ORAL_TABLET | Freq: Two times a day (BID) | ORAL | 3 refills | Status: DC

## 2021-11-26 MED ORDER — DILTIAZEM HCL ER COATED BEADS 120 MG PO CP24: 120.0000 mg | ORAL_CAPSULE | Freq: Every day | ORAL | 3 refills | Status: DC

## 2021-11-26 MED ORDER — DILTIAZEM HCL ER COATED BEADS 240 MG PO CP24: 240.0000 mg | ORAL_CAPSULE | Freq: Every day | ORAL | 3 refills | Status: DC

## 2021-11-26 MED ORDER — FUROSEMIDE 80 MG PO TABS: 80.0000 mg | ORAL_TABLET | Freq: Every day | ORAL | 0 refills | Status: DC

## 2021-11-26 MED ORDER — PANTOPRAZOLE SODIUM 40 MG PO TBEC: 40.0000 mg | DELAYED_RELEASE_TABLET | Freq: Two times a day (BID) | ORAL | 3 refills | Status: DC

## 2021-11-26 MED ORDER — ELIQUIS 5 MG PO TABS: 5.0000 mg | ORAL_TABLET | Freq: Two times a day (BID) | ORAL | 2 refills | Status: DC

## 2021-11-26 MED ORDER — PANTOPRAZOLE SODIUM 40 MG PO TBEC: 40.0000 mg | DELAYED_RELEASE_TABLET | Freq: Two times a day (BID) | ORAL | 1 refills | Status: DC

## 2021-11-26 MED ORDER — FUROSEMIDE 80 MG PO TABS: 80.0000 mg | ORAL_TABLET | Freq: Every day | ORAL | 3 refills | Status: DC

## 2021-11-26 NOTE — Patient Instructions (Signed)

## 2021-11-26 NOTE — Progress Notes (Signed)
Subjective: CC: Atrial fibrillation, iron deficiency anemia, dysphagia PCP: [name], DO ZOX:[id number] [name] [name] is a 80 y.o. female presenting to clinic today for:  1.  Atrial fibrillation with iron deficiency anemia Patient last saw her cardiologist a few weeks ago.  She was hospitalized in November due to CHF exacerbation with superimposed pneumonia.  She was noted to be anemic and was subsequently transfused last month.  She is not currently anticoagulated for atrial fibrillation.  Last hemoglobin was 11.2 up from 9.5.  She is brought to the office today by her daughter.  She does not report any bleeding.  Needs refills on Lasix, Eliquis and Protonix.  Also needs her Cardizem prescriptions, which were recently changed sent in for 90-day supplies.  These were apparently only sent in for #30.  2.  Depressed mood/fatigue The patient is tearful today because she is had ongoing hand issues despite recent surgical release of one of her tendons.  They have not reached out back to the hand specialist for further recommendations about this yet.  She reports fatigue and wonders if the iron infusion that she had is even helpful.  Asking for repeat labs   ROS: Per HPI  Allergies  Allergen Reactions   Buprenorphine Hcl Itching   Cymbalta [Duloxetine Hcl] Other (See Comments)    Other reaction(s): Other (See Comments) Personality changes - crying  2014   Morphine And Related Itching   Oxycodone-Acetaminophen Other (See Comments)    Personality changes    Valium [Diazepam] Other (See Comments)    Personality changes - "in another world" ;  Hallucinations  2015   Statins Other (See Comments)    Other reaction(s): Other (See Comments) Muscle weakness Muscle weakness   Morphine Itching   Altace [Ramipril] Other (See Comments)    unknown   Floxin [Ofloxacin] Other (See Comments)    unknown   Hydromorphone Other (See Comments)    Other reaction(s): Confusion (intolerance) unknown    Lovaza [Omega-3-Acid Ethyl Esters] Other (See Comments)    Muscle weakness    Trilipix [Choline Fenofibrate] Other (See Comments)    Muscle weakness    Zetia [Ezetimibe] Other (See Comments)    Muscle weakness    Past Medical History:  Diagnosis Date   Abnormal nuclear cardiac imaging test    Anemia    Anemia, iron deficiency 07/02/2015   Anxiety    Arthritis    "knees, back, fingers, toes; joints" (01/07/2015)   Atrial fibrillation (HCC)    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 Abnl nuc 11/2014. Cath 12/2014 - turned down for CABG. Ultimately s/p TTVP, rotational atherectomy, PTCA and stenting of the ostial LCx and left main into the LAD (crush technique), and IVUS of the LAD/Left main. // b. Myoview 11/17: EF 48, poor quality/significant artifact; inf-lateral, inferior ischemia; Intermediate Risk   Chronic atrial fibrillation ([place])    a.  First noted post-op 9/15 spinal fusion.  She had cardioversion, not on anticoagulation. Fall risk, unsteady. // failed DCCV // Holter 10/17: AFib, Avg HR 97, PVCs, no other arrhythmia   Chronic back pain greater than 3 months duration    a. spinal stenosis.  Spinal fusion with rods in 2/15 at [facility] spinal fusion 9/15.   Chronic diastolic CHF    Echo 9/81:  EF 50-55%, trivial AI, midl MR, mod LAE, PASP 37 mmHg // Echocardiogram 03/2020: EF 45-50, no RWMA, mild LVH, mild reduced RVSF, mildly elevated PASP (RVSP  38.3), mild LAE, mild MR, mild to mod TR, trivial AI // Echo 9/21: EF 45, mild LVH, mildly reduced RV SF, moderate LAE, mild RAE, mild MR, mild AI      Circadian rhythm sleep disorder    CTS (carpal tunnel syndrome)    Deficiency anemia 11/10/2014   Depression    Diarrhea    Diverticulitis of colon    Esophageal stricture    Gastritis    Gastroesophageal hernia 07/06/2013   GERD (gastroesophageal reflux disease)    Hiatal hernia    History of blood transfusion    "most of them related to OR's"     HTN (hypertension)    Hyperlipidemia    IBS (irritable bowel syndrome)    Incontinence 10/13/2012   Insomnia    Ischemic chest pain ([place])    Memory disorder 99/37/1696   Metabolic syndrome 78/93/8101   MGUS (monoclonal gammopathy of unknown significance) dx'd 11/2014   a. Neg BMB 11/2014.   Multiple falls    Obesity    Obstructive sleep apnea    "have mask; don't wear it" (01/07/2015)   Orthostasis    PAT (paroxysmal atrial tachycardia) ([place])    Personal history of colonic polyps 10/25/2011 & 12/02/11   not retrieved Dr Lyla Son & tubular adenomas   Pneumonia 03/2014   Sinus bradycardia    a. Baseline HR 50s-60s.   Stroke [facility]) early 2000's   "small"; denies residual on 01/07/2015)    Current Outpatient Medications:    acetaminophen (TYLENOL) 500 MG tablet, Take 1,000 mg by mouth every 6 (six) hours as needed for headache. (Patient not taking: Reported on 11/17/2021), Disp: , Rfl:    albuterol (VENTOLIN HFA) 108 (90 Base) MCG/ACT inhaler, Inhale 2 puffs into the lungs every 6 (six) hours as needed for wheezing or shortness of breath., Disp: , Rfl:    aspirin EC 81 MG tablet, Take 1 tablet (81 mg total) by mouth daily., Disp: 90 tablet, Rfl: 3   Cholecalciferol (VITAMIN D) 2000 units tablet, Take 2,000 Units by mouth daily., Disp: , Rfl:    clobetasol cream (TEMOVATE) 7.51 %, Apply 1 application topically See admin instructions. Applies to vaginal area twice a week., Disp: , Rfl:    diltiazem (CARDIZEM CD) 120 MG 24 hr capsule, Take 1 capsule (120 mg total) by mouth at bedtime., Disp: 30 capsule, Rfl: 0   diltiazem (CARDIZEM CD) 240 MG 24 hr capsule, Take 1 capsule (240 mg total) by mouth daily., Disp: 30 capsule, Rfl: 0   diphenoxylate-atropine (LOMOTIL) 2.5-0.025 MG tablet, Take 1-2 tablets by mouth 4 (four) times daily as needed for diarrhea or loose stools., Disp: , Rfl:    ELIQUIS 5 MG TABS tablet, Take 1 tablet (5 mg total) by mouth 2 (two) times daily., Disp: 180 tablet, Rfl:  2   Eszopiclone 3 MG TABS, Take 1 tablet (3 mg total) by mouth at bedtime. For insomnia,take immediately before bedtime. (Patient taking differently: Take 3 mg by mouth at bedtime.), Disp: 90 tablet, Rfl: 1   fentaNYL (DURAGESIC - DOSED MCG/HR) 50 MCG/HR, Place 1 patch (50 mcg total) onto the skin every 3 (three) days., Disp: 10 patch, Rfl: 0   FLUoxetine (PROZAC) 40 MG capsule, Take 1 capsule (40 mg total) by mouth daily., Disp: 90 capsule, Rfl: 3   furosemide (LASIX) 80 MG tablet, Take 1 tablet (80 mg total) by mouth daily., Disp: 90 tablet, Rfl: 0   guaiFENesin (ROBITUSSIN) 100 MG/5ML liquid, Take 5 mLs by mouth  every 4 (four) hours as needed for cough or to loosen phlegm. (Patient taking differently: Take 100 mLs by mouth every 4 (four) hours as needed for cough or to loosen phlegm.), Disp: 120 mL, Rfl: 0   HYDROcodone-acetaminophen (NORCO) 10-325 MG per tablet, Take 1 tablet by mouth every 4 (four) hours as needed for moderate pain. , Disp: , Rfl:    LINZESS 145 MCG CAPS capsule, Take 145 mcg by mouth daily as needed (constipation)., Disp: , Rfl:    loperamide (IMODIUM A-D) 2 MG tablet, Take 4 mg by mouth 4 (four) times daily as needed for diarrhea or loose stools., Disp: , Rfl:    Multiple Vitamin (MULTIVITAMIN) tablet, Take 1 tablet by mouth daily., Disp: , Rfl:    nitroGLYCERIN (NITROSTAT) 0.4 MG SL tablet, Place 1 tablet (0.4 mg total) under the tongue every 5 (five) minutes as needed for chest pain (up to 3 doses)., Disp: 25 tablet, Rfl: 3   NONFORMULARY OR COMPOUNDED ITEM, Apply 1 application topically daily. Antifungal solution: Terbinafine 3%, Fluconazole 2%, Tea Tree Oil 5%, Urea 10%, Ibuprofen 2% in DMSO suspension #8mL, Disp: , Rfl:    pantoprazole (PROTONIX) 40 MG tablet, Take 1 tablet (40 mg total) by mouth 2 (two) times daily., Disp: 180 tablet, Rfl: 1   potassium chloride (KLOR-CON) 10 MEQ tablet, Take 10 mEq by mouth daily., Disp: , Rfl:    rosuvastatin (CRESTOR) 10 MG tablet,  Take 5 mg alternating with 10 mg daily for cholesterol (Patient taking differently: Take 5-10 mg by mouth See admin instructions. Take 5 mg alternating with 10 mg daily for cholesterol), Disp: 90 tablet, Rfl: 0   triamcinolone cream (KENALOG) 0.1 %, Apply 1 application topically 3 (three) times daily as needed (rash)., Disp: , Rfl:  Social History   Socioeconomic History   Marital status: Widowed    Spouse name: Not on file   Number of children: 2   Years of education: HS   Highest education level: Not on file  Occupational History   Occupation: Licensed conveyancer- retired    Comment: retired  Tobacco Use   Smoking status: Never   Smokeless tobacco: Never  Vaping Use   Vaping Use: Never used  Substance and Sexual Activity   Alcohol use: No    Comment: 01/07/2015 "glass of wine at Christmas, maybe"   Drug use: No   Sexual activity: Never  Other Topics Concern   Not on file  Social History Narrative   Patient is right handed   Patient drinks 1-2 sodas daily.   patlient lives alone.   Social Determinants of Health   Financial Resource Strain: Not on file  Food Insecurity: Not on file  Transportation Needs: Not on file  Physical Activity: Not on file  Stress: Not on file  Social Connections: Not on file  Intimate Partner Violence: Not on file   Family History  Problem Relation Age of Onset   Coronary artery disease Father    Peripheral vascular disease Father    Coronary artery disease Mother    Coronary artery disease Brother    Colon cancer Sister    Emphysema Sister    Sleep apnea Son    Colon cancer Sister        spread to her brain   Emphysema Brother    Dementia Neg Hx     Objective: Office vital signs reviewed. BP (!) 143/73   Pulse (!) 140   Temp 97.9 F (36.6 C)   Ht 5\' 6"  (  1.676 m)   Wt 176 lb 12.8 oz (80.2 kg)   SpO2 96%   BMI 28.54 kg/m   Physical Examination:  General: Awake, alert, nontoxic elderly female, intermittently crying HEENT: Normal;  sclera white.  No conjunctival pallor Cardio: Her rate is elevated and irregular (though she is actively crying), S1S2 heard, no murmurs appreciated Pulm: clear to auscultation bilaterally, no wheezes, rhonchi or rales; normal work of breathing on room air MSK: Contracture of right middle finger noted Psych: Mood very labile.  Patient is tearful.  Depression screen [facility] 2/9 11/26/2021 10/24/2021 10/21/2021  Decreased Interest 1 1 1   Down, Depressed, Hopeless 1 1 1   PHQ - 2 Score 2 2 2   Altered sleeping 1 0 0  Tired, decreased energy 1 1 1   Change in appetite 0 0 0  Feeling bad or failure about yourself  1 1 1   Trouble concentrating 1 1 1   Moving slowly or fidgety/restless 0 0 0  Suicidal thoughts 0 0 0  PHQ-9 Score 6 5 5   Difficult doing work/chores Somewhat difficult Somewhat difficult Somewhat difficult  Some recent data might be hidden   GAD 7 : Generalized Anxiety Score 11/26/2021 10/24/2021 10/21/2021 06/02/2021  Nervous, Anxious, on Edge 1 1 1 1   Control/stop worrying 1 1 1 3   Worry too much - different things 1 1 1 3   Trouble relaxing 0 0 0 2  Restless 0 0 0 0  Easily annoyed or irritable 1 0 0 1  Afraid - awful might happen 0 0 0 1  Total GAD 7 Score 4 3 3 11   Anxiety Difficulty Somewhat difficult Not difficult at all Not difficult at all Somewhat difficult    Assessment/ Plan: 80 y.o. female   Permanent atrial fibrillation ([place]) - Plan: AMB Referral to Community Care Coordinaton  Iron deficiency anemia due to chronic blood loss - Plan: CBC, Ferritin, Iron, AMB Referral to Community Care Coordinaton  Dysphagia, unspecified type - Plan: AMB Referral to [place]  Atherosclerosis of native coronary artery of native heart without angina pectoris - Plan: pantoprazole (PROTONIX) 40 MG tablet, AMB Referral to [facility] Coordinaton, DISCONTINUED: pantoprazole (PROTONIX) 40 MG tablet  History of recent pneumonia - Plan: DG Chest 2 View, AMB Referral to  Community Care Coordinaton  Anxiety and depression - Plan: AMB Referral to [place]  She had an elevated heart rate today but was actively crying for much of the visit.  Would say that her heart rate was around 120 during our visit though recorded as 145 pulse ox.  I am going to [place] her heart doctor just as an [place].  I did go ahead and renew her Cardizem for 90-day supplies with current directions.  I have ordered CBC, ferritin, iron given known history of iron deficiency due to blood loss requiring iron infusions  Chest x-ray was ordered to repeat evaluate recent pneumonia after hospitalization.  I placed a referral to coordinated care medicine for counseling service for this patient as well as ongoing phone checkups with her.  There is apparently been issues with getting her medications and refills.  No orders of the defined types were placed in this encounter.  No orders of the defined types were placed in this encounter.    [name], DO [place] [phone]

## 2021-11-27 ENCOUNTER — Telehealth: Payer: Self-pay

## 2021-11-27 LAB — CBC
Hematocrit: 39.3 % (ref 34.0–46.6)
Hemoglobin: 12.6 g/dL (ref 11.1–15.9)
MCH: 29.2 pg (ref 26.6–33.0)
MCHC: 32.1 g/dL (ref 31.5–35.7)
MCV: 91 fL (ref 79–97)
Platelets: 197 10*3/uL (ref 150–450)
RBC: 4.32 x10E6/uL (ref 3.77–5.28)
RDW: 15.5 % — ABNORMAL HIGH (ref 11.7–15.4)
WBC: 9.6 10*3/uL (ref 3.4–10.8)

## 2021-11-27 LAB — IRON: Iron: 47 ug/dL (ref 27–139)

## 2021-11-27 LAB — FERRITIN: Ferritin: 297 ng/mL — ABNORMAL HIGH (ref 15–150)

## 2021-11-27 NOTE — Chronic Care Management (AMB) (Signed)
  Chronic Care Management   Outreach Note  11/27/2021 Name: [name] [name] MRN: [id number] DOB: [birth year]  [name] [name] is a 80 y.o. year old female who is a primary care patient of [name], DO. I reached out to [place] by phone today in response to a referral sent by [name] [name]'s primary care provider.  An unsuccessful telephone outreach was attempted today. The patient was referred to the [place] management team for assistance with care management and care coordination.   Follow Up Plan: A HIPAA compliant phone message was left for the patient providing contact information and requesting a return call.  The care management team will reach out to the patient again over the next 7 days.  If patient returns call to provider office, please advise to call [facility] at [place], [address], [place], [place] [address] Direct Dial: [phone] [name] Website: [email or web address]

## 2021-12-03 ENCOUNTER — Other Ambulatory Visit: Payer: Self-pay | Admitting: Cardiovascular Disease

## 2021-12-04 NOTE — Chronic Care Management (AMB) (Signed)
°  Chronic Care Management   Outreach Note  12/04/2021 Name: Tisheena Maguire [place] MRN: [id number] DOB: 18-Jun-1941  Kloey W Luepke is a 80 y.o. year old female who is a primary care patient of [name], DO. I reached out to [place] by phone today in response to a referral sent by Ms. Elray Mcgregor Roach's primary care provider.  A second unsuccessful telephone outreach was attempted today. The patient was referred to the [place] management team for assistance with care management and care coordination.   Follow Up Plan: A HIPAA compliant phone message was left for the patient providing contact information and requesting a return call.  The care management team will reach out to the patient again over the next 5 days.  If patient returns call to provider office, please advise to call [facility]  at [place], [address], [place], [place] [address] Direct Dial: [phone] [email or web address]: [email or web address]

## 2021-12-08 ENCOUNTER — Other Ambulatory Visit: Payer: Self-pay | Admitting: Family Medicine

## 2021-12-08 NOTE — Chronic Care Management (AMB) (Signed)
Chronic Care Management   Note  12/08/2021 Name: Yaritza Leist [place] MRN: [id number] DOB: 07/01/1941  Janica W Pio is a 80 y.o. year old female who is a primary care patient of [name], DO. I reached out to [place] by phone today in response to a referral sent by Ms. Farrell PCP.  Ms. Basilio was given information about Chronic Care Management services today including:  CCM service includes personalized support from designated clinical staff supervised by her physician, including individualized plan of care and coordination with other care providers 24/7 contact phone numbers for assistance for urgent and routine care needs. Service will only be billed when office clinical staff spend 20 minutes or more in a month to coordinate care. Only one practitioner may furnish and bill the service in a calendar month. The patient may stop CCM services at any time (effective at the end of the month) by phone call to the office staff. The patient is responsible for co-pay (up to 20% after annual deductible is met) if co-pay is required by the individual health plan.   Patient did not agree to enrollment in care management services and does not wish to consider at this time.  Follow up plan: Patient declines further follow up and engagement by the care management team. Appropriate care team members and provider have been notified via electronic communication.   Noreene Larsson, [place], [place], [place] [address] Direct Dial: [phone] [email or web address]

## 2021-12-23 ENCOUNTER — Ambulatory Visit: Payer: Medicare HMO | Admitting: Podiatry

## 2021-12-23 ENCOUNTER — Other Ambulatory Visit: Payer: Self-pay | Admitting: Cardiovascular Disease

## 2021-12-23 ENCOUNTER — Other Ambulatory Visit: Payer: Self-pay

## 2021-12-23 ENCOUNTER — Encounter: Payer: Self-pay | Admitting: Podiatry

## 2021-12-23 DIAGNOSIS — Q828 Other specified congenital malformations of skin: Secondary | ICD-10-CM

## 2021-12-23 DIAGNOSIS — M79675 Pain in left toe(s): Secondary | ICD-10-CM | POA: Diagnosis not present

## 2021-12-23 DIAGNOSIS — M79674 Pain in right toe(s): Secondary | ICD-10-CM | POA: Diagnosis not present

## 2021-12-23 DIAGNOSIS — G609 Hereditary and idiopathic neuropathy, unspecified: Secondary | ICD-10-CM

## 2021-12-23 DIAGNOSIS — B351 Tinea unguium: Secondary | ICD-10-CM

## 2021-12-24 MED ORDER — POTASSIUM CHLORIDE CRYS ER 10 MEQ PO TBCR: 10.0000 meq | EXTENDED_RELEASE_TABLET | Freq: Every day | ORAL | 3 refills | Status: DC

## 2021-12-26 ENCOUNTER — Encounter: Payer: Self-pay | Admitting: Cardiovascular Disease

## 2021-12-26 ENCOUNTER — Other Ambulatory Visit: Payer: Self-pay | Admitting: Cardiovascular Disease

## 2021-12-26 MED ORDER — POTASSIUM CHLORIDE ER 10 MEQ PO TBCR: 10.0000 meq | EXTENDED_RELEASE_TABLET | Freq: Every day | ORAL | 3 refills | Status: DC

## 2021-12-26 MED ORDER — POTASSIUM CHLORIDE ER 10 MEQ PO TBCR: 20.0000 meq | EXTENDED_RELEASE_TABLET | Freq: Every day | ORAL | 3 refills | Status: DC

## 2021-12-27 NOTE — Progress Notes (Signed)
Subjective: [name] [name] is a 81 y.o. female patient seen today with h/o peripheral neuropathy for follow up of painful porokeratosis plantar aspect of right foot and painful thick toenails that are difficult to trim. Pain interferes with ambulation. Aggravating factors include wearing enclosed shoe gear. Pain is relieved with periodic professional debridement.  New problems reported today:States her legs are swollen and she needs to take her fluid pill, but cannot do so when she has to leave her home. She will take it when she gets home today.  Patient is accompanied by her daughter on today's visit.  PCP is [name], DO. Last visit was: 11/26/2021.  Allergies  Allergen Reactions   Buprenorphine Hcl Itching   Cymbalta [Duloxetine Hcl] Other (See Comments)    Other reaction(s): Other (See Comments) Personality changes - crying  2014   Morphine And Related Itching   Oxycodone-Acetaminophen Other (See Comments)    Personality changes    Valium [Diazepam] Other (See Comments)    Personality changes - "in another world" ;  Hallucinations  2015   Statins Other (See Comments)    Other reaction(s): Other (See Comments) Muscle weakness Muscle weakness   Morphine Itching   Altace [Ramipril] Other (See Comments)    unknown   Floxin [Ofloxacin] Other (See Comments)    unknown   Hydromorphone Other (See Comments)    Other reaction(s): Confusion (intolerance) unknown   Lovaza [Omega-3-Acid Ethyl Esters] Other (See Comments)    Muscle weakness    Trilipix [Choline Fenofibrate] Other (See Comments)    Muscle weakness    Zetia [Ezetimibe] Other (See Comments)    Muscle weakness     Objective: Physical Exam  General: Patient is a pleasant 81 y.o. Caucasian female in NAD. AAO x 3.   Neurovascular Examination: Capillary refill time to digits immediate b/l. Palpable DP pulse(s) b/l LE. Faintly palpable PT pulse(s) b/l LE. No pain with calf compression b/l. +1 pitting edema  noted BLE. No cyanosis or clubbing noted b/l LE.  Pt has subjective symptoms of neuropathy. Protective sensation intact 5/5 intact bilaterally with 10g monofilament b/l. Vibratory sensation intact b/l.  Dermatological:  Pedal skin is warm and supple b/l LE. No open wounds b/l LE. No interdigital macerations noted b/l LE. Toenails 1-5 b/l elongated, discolored, dystrophic, thickened, crumbly with subungual debris and tenderness to dorsal palpation. Porokeratotic lesion(s) submet head 5 right foot. No erythema, no edema, no drainage, no fluctuance.  Musculoskeletal:  Muscle strength 5/5 to all lower extremity muscle groups bilaterally. No pain, crepitus or joint limitation noted with ROM bilateral LE. Hammertoe(s) noted to the R 2nd toe.  Assessment: 1. Pain due to onychomycosis of toenails of both feet   2. Porokeratosis   3. Idiopathic peripheral neuropathy    Plan: Patient was evaluated and treated and all questions answered. Consent given for treatment as described below: -Patient to continue soft, supportive shoe gear daily. -Mycotic toenails 1-5 bilaterally were debrided in length and girth with sterile nail nippers and dremel without incident. -Painful porokeratotic lesion(s) submet head 5 right foot pared and enucleated with sterile scalpel blade without incident. Total number of lesions debrided=1. -Patient/POA to call should there be question/concern in the interim.  Return in about 9 weeks (around 02/24/2022).  Marzetta Board, DPM

## 2022-01-01 MED ORDER — POTASSIUM CHLORIDE ER 10 MEQ PO TBCR: 20.0000 meq | EXTENDED_RELEASE_TABLET | Freq: Every day | ORAL | 3 refills | Status: DC

## 2022-01-01 NOTE — Addendum Note (Signed)
Addended by: Levonne Hubert on: 01/01/2022 10:22 AM   Modules accepted: Orders

## 2022-01-23 ENCOUNTER — Other Ambulatory Visit: Payer: Self-pay

## 2022-01-23 ENCOUNTER — Ambulatory Visit: Payer: Medicare HMO | Admitting: Cardiovascular Disease

## 2022-01-23 ENCOUNTER — Encounter: Payer: Self-pay | Admitting: Cardiovascular Disease

## 2022-01-23 VITALS — BP 128/70 | HR 78 | Ht 66.0 in | Wt 176.0 lb

## 2022-01-23 DIAGNOSIS — I2510 Atherosclerotic heart disease of native coronary artery without angina pectoris: Secondary | ICD-10-CM | POA: Diagnosis not present

## 2022-01-23 DIAGNOSIS — I4821 Permanent atrial fibrillation: Secondary | ICD-10-CM

## 2022-01-23 DIAGNOSIS — Z7901 Long term (current) use of anticoagulants: Secondary | ICD-10-CM

## 2022-01-23 DIAGNOSIS — I5020 Unspecified systolic (congestive) heart failure: Secondary | ICD-10-CM

## 2022-01-23 MED ORDER — POTASSIUM CHLORIDE ER 10 MEQ PO TBCR: 10.0000 meq | EXTENDED_RELEASE_TABLET | Freq: Two times a day (BID) | ORAL | 3 refills | Status: DC

## 2022-01-23 NOTE — Progress Notes (Signed)
Cardiology Office Note:    Date:  01/27/2022   ID:  [name] [name], DOB 1941/03/31, MRN [id number]  PCP:  [name], DO   [facility] Providers Cardiologist:  Sherren Mocha, MD Cardiology APP:  Liliane Shi, PA-C  Electrophysiologist:  Vickie Epley, MD     Referring MD: [name], DO   Chief Complaint  Patient presents with   Shortness of Breath    History of Present Illness:    [name] [name] is a 81 y.o. female with a hx of: Coronary artery disease Cath 12/15: Complex bifurcational distal LM/ostial LCx disease - poor CABG candidate PCI 12/15: DES to distal LM and ostial LCx Myoview 11/17: + Inferior ischemia Cath 12/17: Patent LM/LCx stent; no significant RCA disease Myoview 6/21: No ischemia Primarily Diastolic CHF Echocardiogram 4/21: EF 45-50, mild LVH, mild MR, mild-mod TR, trivial AI Echo 8/21: EF 45>>Dilt DC'd due to low EF>>resumed by EP in 10/21 Permanent atrial fibrillation S/p DCCV 1/17 >> ERAF  >> rate control strategy Intol of beta-blocker, digoxin  Eval by EP (Dr. Quentin Ore) in 10/21>>EF felt to be fairly stable>>Diltiazem resumed Monitor 10/21: Avg HR 92 Hypertension Hyperlipidemia S/p multiple back surgeries; managed with chronic narcotic pain medication Hx of orthostatic hypotension; s/p multiple falls in the past Hepatic steatosis (elevated LFTs) MGUS Hx of CVA  Patient is here with her daughter today.  She reports no major changes in her symptoms, but continues to complain of fatigue and leg swelling.  No abdominal swelling, orthopnea, or PND.  No recent chest pain.  Shortness of breath with activity is unchanged over time.  Patient is compliant with her medications and has no questions in this regard today.  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 Abnl nuc 11/2014. Cath 12/2014 - turned down for CABG. Ultimately s/p TTVP, rotational atherectomy, PTCA and stenting of the ostial LCx and left main into the LAD (crush technique), and IVUS of the LAD/Left main. // b. Myoview 11/17: EF 48, poor quality/significant artifact; inf-lateral, inferior ischemia; Intermediate Risk   Chronic atrial fibrillation ([place])    a.  First noted post-op 9/15 spinal fusion.  She had cardioversion, not on anticoagulation. Fall risk, unsteady. // failed DCCV // Holter 10/17: AFib, Avg HR 97, PVCs, no other arrhythmia   Chronic back pain greater than 3 months duration    a. spinal stenosis.  Spinal fusion with rods in 2/15 at [facility] spinal fusion 9/15.   Chronic diastolic CHF    Echo 2/42:  EF 50-55%, trivial AI, midl MR, mod LAE, PASP 37 mmHg // Echocardiogram 03/2020: EF 45-50, no RWMA, mild LVH, mild reduced RVSF, mildly elevated PASP (RVSP 38.3), mild LAE, mild MR, mild to mod TR, trivial AI // Echo 9/21: EF 45, mild LVH, mildly reduced RV SF, moderate LAE, mild RAE, mild MR, mild AI      Circadian rhythm sleep disorder    CTS (carpal tunnel syndrome)    Deficiency anemia 11/10/2014   Depression    Diarrhea    Diverticulitis of colon    Esophageal stricture    Gastritis    Gastroesophageal hernia 07/06/2013   GERD (  reflux disease)   ° Hiatal hernia   ° History of blood transfusion   ° "most of them related to OR's"   ° HTN (hypertension)   ° Hyperlipidemia   ° IBS (irritable bowel syndrome)   ° Incontinence 10/13/2012  ° Insomnia   ° Ischemic chest pain (HCC)   ° Memory disorder 12/04/2014  ° Metabolic syndrome 04/24/2014  ° MGUS (monoclonal gammopathy of unknown significance) dx'd 11/2014  ° a. Neg BMB 11/2014.  ° Multiple falls   ° Obesity   ° Obstructive sleep apnea   ° "have mask; don't wear it" (01/07/2015)  ° Orthostasis   ° PAT  (paroxysmal atrial tachycardia) (HCC)   ° Personal history of colonic polyps 10/25/2011 & 12/02/11  ° not retrieved Dr Sam Dewart & tubular adenomas  ° Pneumonia 03/2014  ° Sinus bradycardia   ° a. Baseline HR 50s-60s.  ° Stroke (HCC) early 2000's  ° "small"; denies residual on 01/07/2015)  ° ° °Past Surgical History:  °Procedure Laterality Date  ° BACK SURGERY    ° BONE MARROW BIOPSY  11/2014  ° CARDIAC CATHETERIZATION  12/27/2014  ° Procedure: INTRAVASCULAR PRESSURE WIRE/FFR STUDY;  Surgeon: Dalton S McLean, MD;  Location: [facility] CATH LAB;  Service: Cardiovascular;;  ° CARDIAC CATHETERIZATION N/A 11/25/2016  ° Procedure: Left Heart Cath and Coronary Angiography;  Surgeon:  , MD;  Location: [facility] INVASIVE CV LAB;  Service: Cardiovascular;  Laterality: N/A;  ° CARDIOVERSION N/A 02/13/2016  ° Procedure: CARDIOVERSION;  Surgeon: Peter C Nishan, MD;  Location: [facility] ENDOSCOPY;  Service: Cardiovascular;  Laterality: N/A;  ° CARPAL TUNNEL RELEASE Right 1980's  ° CATARACT EXTRACTION Bilateral   ° CATARACT EXTRACTION W/ INTRAOCULAR LENS  IMPLANT, BILATERAL  2000's  ° CORONARY ANGIOPLASTY WITH STENT PLACEMENT  01/07/2015  ° "2"  ° CORONARY STENT PLACEMENT    ° DILATION AND CURETTAGE OF UTERUS    ° ESOPHAGOGASTRODUODENOSCOPY (EGD) WITH ESOPHAGEAL DILATION  "several times"  ° JOINT REPLACEMENT    ° KNEE ARTHROSCOPY Left 1995  ° LAPAROSCOPIC CHOLECYSTECTOMY  2003  ° LEFT HEART CATHETERIZATION WITH CORONARY ANGIOGRAM N/A 12/27/2014  ° Procedure: LEFT HEART CATHETERIZATION WITH CORONARY ANGIOGRAM;  Surgeon: Dalton S McLean, MD;  Location: [facility] CATH [facility];  Service: Cardiovascular;  Laterality: N/A;  ° PERCUTANEOUS CORONARY ROTOBLATOR INTERVENTION (PCI-R)  01/07/2015  ° PERCUTANEOUS CORONARY ROTOBLATOR INTERVENTION (PCI-R) N/A 01/07/2015  ° Procedure: PERCUTANEOUS CORONARY ROTOBLATOR INTERVENTION (PCI-R);  Surgeon:  D , MD;  Location: [facility] CATH [facility];  Service: Cardiovascular;  Laterality: N/A;  ° POSTERIOR FUSION THORACIC SPINE   08/2015  ° POSTERIOR LUMBAR FUSION  01/2015  ° TOTAL KNEE ARTHROPLASTY Bilateral 1990's - 2000's  ° ° °Current Medications: °Current Meds  °Medication Sig  ° acetaminophen (TYLENOL) 500 MG tablet Take 1,000 mg by mouth every 6 (six) hours as needed for headache.  ° albuterol (VENTOLIN HFA) 108 (90 Base) MCG/ACT inhaler Inhale 2 puffs into the lungs every 6 (six) hours as needed for wheezing or shortness of breath.  ° aspirin EC 81 MG tablet Take 1 tablet (81 mg total) by mouth daily.  ° Cholecalciferol (VITAMIN D) 2000 units tablet Take 2,000 Units by mouth daily.  ° clobetasol cream (TEMOVATE) 0.05 % Apply 1 application topically See admin instructions. Applies to vaginal area twice a week.  ° diltiazem (CARDIZEM CD) 240 MG 24 hr capsule TAKE 1 CAPSULE DAILY.  ° diltiazem (TIAZAC) 240 MG 24 hr capsule diltiazem CD 240 mg capsule,extended release 24 hr  ° diphenoxylate-atropine (  diphenoxylate-atropine (LOMOTIL) 2.5-0.025 MG tablet Take 1-2 tablets by mouth 4 (four) times daily as needed for diarrhea or loose stools.   ELIQUIS 5 MG TABS tablet Take 1 tablet (5 mg total) by mouth 2 (two) times daily.   Eszopiclone 3 MG TABS Take 1 tablet (3 mg total) by mouth at bedtime. For insomnia,take immediately before bedtime.   fentaNYL (DURAGESIC - DOSED MCG/HR) 50 MCG/HR Place 1 patch (50 mcg total) onto the skin every 3 (three) days.   FLUoxetine (PROZAC) 40 MG capsule Take 1 capsule (40 mg total) by mouth daily.   furosemide (LASIX) 80 MG tablet Take 1 tablet (80 mg total) by mouth daily.   guaiFENesin (ROBITUSSIN) 100 MG/5ML liquid Take 5 mLs by mouth every 4 (four) hours as needed for cough or to loosen phlegm.   HYDROcodone-acetaminophen (NORCO) 10-325 MG per tablet Take 1 tablet by mouth every 4 (four) hours as needed for moderate pain.    LINZESS 145 MCG CAPS capsule Take 145 mcg by mouth daily as needed (constipation).   loperamide (IMODIUM A-D) 2 MG tablet Take 4 mg by mouth 4 (four) times daily as needed for diarrhea or loose  stools.   Multiple Vitamin (MULTIVITAMIN) tablet Take 1 tablet by mouth daily.   nitroGLYCERIN (NITROSTAT) 0.4 MG SL tablet Place 1 tablet (0.4 mg total) under the tongue every 5 (five) minutes as needed for chest pain (up to 3 doses).   NONFORMULARY OR COMPOUNDED ITEM Apply 1 application topically daily. Antifungal solution: Terbinafine 3%, Fluconazole 2%, Tea Tree Oil 5%, Urea 10%, Ibuprofen 2% in DMSO suspension #108m   pantoprazole (PROTONIX) 40 MG tablet Take 1 tablet (40 mg total) by mouth 2 (two) times daily.   rosuvastatin (CRESTOR) 10 MG tablet Take 5 mg alternating with 10 mg daily for cholesterol   triamcinolone cream (KENALOG) 0.1 % Apply 1 application topically 3 (three) times daily as needed (rash).   [DISCONTINUED] potassium chloride (KLOR-CON) 10 MEQ tablet Take 2 tablets (20 mEq total) by mouth daily.     Allergies:   Buprenorphine hcl, Cymbalta [duloxetine hcl], Morphine and related, Oxycodone-acetaminophen, Valium [diazepam], Statins, Morphine, Altace [ramipril], Floxin [ofloxacin], Hydromorphone, Lovaza [omega-3-acid ethyl esters], Trilipix [choline fenofibrate], and Zetia [ezetimibe]   Social History   Socioeconomic History   Marital status: Widowed    Spouse name: Not on file   Number of children: 2   Years of education: HS   Highest education level: Not on file  Occupational History   Occupation: lLicensed conveyancer retired    Comment: retired  Tobacco Use   Smoking status: Never   Smokeless tobacco: Never  Vaping Use   Vaping Use: Never used  Substance and Sexual Activity   Alcohol use: No    Comment: 01/07/2015 "glass of wine at Christmas, maybe"   Drug use: No   Sexual activity: Never  Other Topics Concern   Not on file  Social History Narrative   Patient is right handed   Patient drinks 1-2 sodas daily.   patlient lives alone.   Social Determinants of Health   Financial Resource Strain: Not on file  Food Insecurity: Not on file  Transportation Needs: Not  on file  Physical Activity: Not on file  Stress: Not on file  Social Connections: Not on file     Family History: The patient's family history includes Colon cancer in her sister and sister; Coronary artery disease in her brother, father, and mother; Emphysema in her brother and sister; Peripheral vascular disease in  father; Sleep apnea in her son. There is no history of Dementia. ° °ROS:   °Please see the history of present illness.    °All other systems reviewed and are negative. ° °EKGs/Labs/Other Studies Reviewed:   ° °The following studies were reviewed today: °Myoview Scan 05/29/20: °Nuclear stress EF: 44%. °There was no ST segment deviation noted during stress. °The study is normal. °The left ventricular ejection fraction is moderately decreased (30-44%). °This is an intermediate risk study due to reduced systolic function. There is no ischemia. ° °Echo 10/27/21: °1. Left ventricular ejection fraction, by estimation, is 50 to 55%. The  °left ventricle has low normal function. The left ventricle has no regional  °wall motion abnormalities. There is mild left ventricular hypertrophy.  °Left ventricular diastolic  °parameters are indeterminate.  ° 2. Right ventricular systolic function is normal. The right ventricular  °size is normal.  ° 3. Left atrial size was mildly dilated.  ° 4. Right atrial size was mildly dilated.  ° 5. The mitral valve is abnormal. Trivial mitral valve regurgitation. No  °evidence of mitral stenosis.  ° 6. The aortic valve is tricuspid. There is moderate calcification of the  °aortic valve. Aortic valve regurgitation is trivial. Mild to moderate  °aortic valve sclerosis/calcification is present, without any evidence of  °aortic stenosis.  ° 7. The inferior vena cava is normal in size with greater than 50%  °respiratory variability, suggesting right atrial pressure of 3 mmHg.  ° °Recent Labs: °06/02/2021: TSH 2.520 °10/13/2021: ALT 21 °10/24/2021: B Natriuretic Peptide  270.0 °10/28/2021: BUN 21; Creatinine, Ser 0.91; Magnesium 2.1; Potassium 3.9; Sodium 138 °11/26/2021: Hemoglobin 12.6; Platelets 197  °Recent Lipid Panel °   °Component Value Date/Time  ° CHOL 138 07/21/2021 1557  ° CHOL 156 05/11/2013 1536  ° TRIG 81 10/24/2021 1420  ° TRIG 121 02/18/2016 1504  ° TRIG 239 (H) 05/11/2013 1536  ° HDL 70 07/21/2021 1557  ° HDL 57 02/18/2016 1504  ° HDL 43 05/11/2013 1536  ° CHOLHDL 2.0 07/21/2021 1557  ° LDLCALC 47 07/21/2021 1557  ° LDLCALC 50 08/21/2014 1141  ° LDLCALC 65 05/11/2013 1536  ° ° ° °Risk Assessment/Calculations:   ° °CHA2DS2-VASc Score = 6  ° This indicates a 9.7% annual risk of stroke. °The patient's score is based upon: °CHF History: 1 °HTN History: 1 °Diabetes History: 0 °Stroke History: 0 °Vascular Disease History: 1 °Age Score: 2 °Gender Score: 1 °  ° ° °    ° °Physical Exam:   ° °VS:  BP 128/70    Pulse 78    Ht 5' 6" (1.676 m)    Wt 176 lb (79.8 kg)    SpO2 99%    BMI 28.41 kg/m²    ° °Wt Readings from Last 3 Encounters:  °01/23/22 176 lb (79.8 kg)  °11/26/21 176 lb 12.8 oz (80.2 kg)  °11/17/21 174 lb (78.9 kg)  °  ° °GEN:  Well nourished, well developed in no acute distress °HEENT: Normal °NECK: No JVD; No carotid bruits °LYMPHATICS: No lymphadenopathy °CARDIAC: irregularly irregular, no murmurs, rubs, gallops °RESPIRATORY:  Clear to auscultation without rales, wheezing or rhonchi  °ABDOMEN: Soft, non-tender, non-distended °MUSCULOSKELETAL:  1+ bilateral pretibial edema; No deformity  °SKIN: Warm and dry °NEUROLOGIC:  Alert and oriented x 3 °PSYCHIATRIC:  Normal affect  ° °ASSESSMENT:   ° °1. Permanent atrial fibrillation (HCC)   °2. Coronary artery disease involving native coronary artery of native heart without angina pectoris   °3. Chronic   anticoagulation   °4. HFmrEF (heart failure with mildly reduced EF)   ° °PLAN:   ° °In order of problems listed above: ° °Heart rate reasonably controlled.  Patient is appropriately anticoagulated with apixaban which she  tolerates well.  She remains on diltiazem and for rate control.  Symptoms of chronic fatigue and shortness of breath with activity are unchanged over time.  Most recent echocardiogram reviewed which demonstrates low normal LV function with LVEF 50 to 55% and no significant valvular disease. °Stable without symptoms of angina.  Tolerates low-dose aspirin after history of left main stenting. °Tolerating apixaban, no recent problems with bleeding. °Symptoms are multifactorial with deconditioning playing a major role.  No evidence of volume overload on exam.  Suspect chronic leg edema related to venous insufficiency and immobility more than CHF.  No orthopnea or PND.  Continue current dose of furosemide.  Continue potassium supplementation. ° °   ° °   ° ° °Medication Adjustments/Labs and Tests Ordered: °Current medicines are reviewed at length with the patient today.  Concerns regarding medicines are outlined above.  °No orders of the defined types were placed in this encounter. ° °Meds ordered this encounter  °Medications  ° potassium chloride (KLOR-CON) 10 MEQ tablet  °  Sig: Take 1 tablet (10 mEq total) by mouth 2 (two) times daily.  °  Dispense:  180 tablet  °  Refill:  3  ° ° °Patient Instructions  °Medication Instructions:  °Refilled Potassium 10mEq °*If you need a refill on your cardiac medications before your next appointment, please call your pharmacy* ° ° °Lab Work: °NONE °If you have labs (blood work) drawn today and your tests are completely normal, you will receive your results only by: °MyChart Message (if you have MyChart) OR °A paper copy in the mail °If you have any lab test that is abnormal or we need to change your treatment, we will call you to review the results. ° ° °Testing/Procedures: °NONE ° ° °Follow-Up: °At [facility], you and your health needs are our priority.  As part of our continuing mission to provide you with exceptional heart care, we have created designated Provider Care Teams.   These Care Teams include your primary Cardiologist (physician) and Advanced Practice Providers (APPs -  Physician Assistants and Nurse Practitioners) who all work together to provide you with the care you need, when you need it. ° °Your next appointment:   °6 month(s) ° °The format for your next appointment:   °In Person ° °Provider:   °Scott Weaver, PA-C     Then,  , MD will plan to see you again in 1 year(s).  ° °Thank you for allowing myself and [facility] to serve you, God Bless!!   ° °Signed, ° , MD  °01/27/2022 2:02 PM    °[facility] °

## 2022-01-23 NOTE — Patient Instructions (Signed)
Medication Instructions:  Refilled Potassium 30mEq *If you need a refill on your cardiac medications before your next appointment, please call your pharmacy*   Lab Work: NONE If you have labs (blood work) drawn today and your tests are completely normal, you will receive your results only by: [place] (if you have MyChart) OR A paper copy in the mail If you have any lab test that is abnormal or we need to change your treatment, we will call you to review the results.   Testing/Procedures: NONE   Follow-Up: At [facility], you and your health needs are our priority.  As part of our continuing mission to provide you with exceptional heart care, we have created designated Provider Care Teams.  These Care Teams include your primary Cardiologist (physician) and Advanced Practice Providers (APPs -  Physician Assistants and Nurse Practitioners) who all work together to provide you with the care you need, when you need it.  Your next appointment:   6 month(s)  The format for your next appointment:   In Person  Provider:   Richardson Dopp, PA-C     Then, Sherren Mocha, MD will plan to see you again in 1 year(s).   Thank you for allowing myself and [facility] to serve you, God Bless!!

## 2022-02-06 ENCOUNTER — Other Ambulatory Visit: Payer: Self-pay | Admitting: Family Medicine

## 2022-02-06 MED ORDER — ROSUVASTATIN CALCIUM 10 MG PO TABS: ORAL_TABLET | ORAL | 0 refills | Status: DC

## 2022-02-09 ENCOUNTER — Other Ambulatory Visit: Payer: Self-pay

## 2022-02-27 ENCOUNTER — Ambulatory Visit: Payer: Medicare HMO | Admitting: Podiatry

## 2022-02-27 ENCOUNTER — Non-Acute Institutional Stay (SKILLED_NURSING_FACILITY): Payer: Medicare HMO | Admitting: Adult Health

## 2022-02-27 ENCOUNTER — Other Ambulatory Visit: Payer: Self-pay | Admitting: Adult Health

## 2022-02-27 DIAGNOSIS — D649 Anemia, unspecified: Secondary | ICD-10-CM

## 2022-02-27 DIAGNOSIS — I25110 Atherosclerotic heart disease of native coronary artery with unstable angina pectoris: Secondary | ICD-10-CM

## 2022-02-27 DIAGNOSIS — F339 Major depressive disorder, recurrent, unspecified: Secondary | ICD-10-CM

## 2022-02-27 DIAGNOSIS — K582 Mixed irritable bowel syndrome: Secondary | ICD-10-CM | POA: Diagnosis not present

## 2022-02-27 DIAGNOSIS — R1319 Other dysphagia: Secondary | ICD-10-CM

## 2022-02-27 DIAGNOSIS — I4821 Permanent atrial fibrillation: Secondary | ICD-10-CM

## 2022-02-27 DIAGNOSIS — K219 Gastro-esophageal reflux disease without esophagitis: Secondary | ICD-10-CM | POA: Diagnosis not present

## 2022-02-27 DIAGNOSIS — M5136 Other intervertebral disc degeneration, lumbar region: Secondary | ICD-10-CM

## 2022-02-27 DIAGNOSIS — G4733 Obstructive sleep apnea (adult) (pediatric): Secondary | ICD-10-CM

## 2022-02-27 DIAGNOSIS — F5101 Primary insomnia: Secondary | ICD-10-CM

## 2022-02-27 DIAGNOSIS — K449 Diaphragmatic hernia without obstruction or gangrene: Secondary | ICD-10-CM | POA: Diagnosis not present

## 2022-02-27 DIAGNOSIS — I5032 Chronic diastolic (congestive) heart failure: Secondary | ICD-10-CM

## 2022-02-27 DIAGNOSIS — J690 Pneumonitis due to inhalation of food and vomit: Secondary | ICD-10-CM

## 2022-02-27 DIAGNOSIS — G894 Chronic pain syndrome: Secondary | ICD-10-CM

## 2022-02-27 DIAGNOSIS — E785 Hyperlipidemia, unspecified: Secondary | ICD-10-CM

## 2022-02-27 DIAGNOSIS — N390 Urinary tract infection, site not specified: Secondary | ICD-10-CM

## 2022-02-27 MED ORDER — HYDROCODONE-ACETAMINOPHEN 10-325 MG PO TABS: 1.0000 | ORAL_TABLET | ORAL | 0 refills | Status: DC | PRN

## 2022-02-27 MED ORDER — FENTANYL 50 MCG/HR TD PT72: 1.0000 | MEDICATED_PATCH | TRANSDERMAL | 0 refills | Status: DC

## 2022-02-27 MED ORDER — ESZOPICLONE 3 MG PO TABS: 3.0000 mg | ORAL_TABLET | Freq: Every day | ORAL | 0 refills | Status: DC

## 2022-03-02 ENCOUNTER — Encounter: Payer: Self-pay | Admitting: Adult Health

## 2022-03-02 DIAGNOSIS — E785 Hyperlipidemia, unspecified: Secondary | ICD-10-CM | POA: Insufficient documentation

## 2022-03-02 DIAGNOSIS — J690 Pneumonitis due to inhalation of food and vomit: Secondary | ICD-10-CM | POA: Insufficient documentation

## 2022-03-02 DIAGNOSIS — F339 Major depressive disorder, recurrent, unspecified: Secondary | ICD-10-CM | POA: Insufficient documentation

## 2022-03-02 DIAGNOSIS — N390 Urinary tract infection, site not specified: Secondary | ICD-10-CM | POA: Insufficient documentation

## 2022-03-02 NOTE — Progress Notes (Signed)
Location:  [place] Room Number: 158 Place of Service:  SNF (31)   CODE STATUS: full code   Allergies  Allergen Reactions   Buprenorphine Hcl Itching   Cymbalta [Duloxetine Hcl] Other (See Comments)    Other reaction(s): Other (See Comments) Personality changes - crying  2014   Morphine And Related Itching   Oxycodone-Acetaminophen Other (See Comments)    Personality changes    Valium [Diazepam] Other (See Comments)    Personality changes - "in another world" ;  Hallucinations  2015   Statins Other (See Comments)    Other reaction(s): Other (See Comments) Muscle weakness Muscle weakness   Morphine Itching   Altace [Ramipril] Other (See Comments)    unknown   Floxin [Ofloxacin] Other (See Comments)    unknown   Hydromorphone Other (See Comments)    Other reaction(s): Confusion (intolerance) unknown   Lovaza [Omega-3-Acid Ethyl Esters] Other (See Comments)    Muscle weakness    Trilipix [Choline Fenofibrate] Other (See Comments)    Muscle weakness    Zetia [Ezetimibe] Other (See Comments)    Muscle weakness     Chief Complaint  Patient presents with   Hospitalization Follow-up    HPI:  She is a 81 year old woman who has been hospitalized from 02-12-31 through 02-27-22. Her medical history includes: atrial fibrillation; CAD; HTN; OSA; GERD; lumbar DDD. She presented to the ED on 02-12-22 after an EGD with endoflip she was seen in 2014 by Dr. Garner Nash for surgical evaluation of her hiatal hernia, but due to her poor functional status at that time the plan was made to defer surgery and monitor. Over the past 2 years her symptoms have gotten worse. In the past 6 months she has not been able to tolerate liquid or solid foods.  Her EGD demonstrated an abnormal esophageal endoflip and continued large hernia that is creating an esophagogastric junction obstruction. She was taken to the OR on 02-17-22. Where she underwent paraesophageal hernia she has been  cleared for a full liquid diet through 03-10-22.  Her post op coarse was complicated by atrial fibrillation with RVR the TEE demonstrated an EF of 45-50%.  She developed acute hypoxic respiratory failure due to aspiration pneumonia. She was treated with ceftriaxone and clindamycin. She had a complicated UTI and will need to augmentin for a total of 7 days 03-04-22. Marland Kitchen She is here for short term rehab with the goal of her care is to return back home.   She has chronic pain and is on a complicated regimen with fentanyl and hydrocodone. No reports of vomiting; nausea or constipation.  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 Abnl nuc 11/2014. Cath 12/2014 - turned down for CABG. Ultimately s/p TTVP, rotational atherectomy, PTCA and stenting of the ostial LCx and left main into the LAD (crush technique), and IVUS of the LAD/Left main. // b. Myoview 11/17: EF 48, poor quality/significant artifact; inf-lateral, inferior ischemia; Intermediate  Risk   Chronic atrial fibrillation ([place])    a.  First noted post-op 9/15 spinal fusion.  She had cardioversion, not on anticoagulation. Fall risk, unsteady. // failed DCCV // Holter 10/17: AFib, Avg HR 97, PVCs, no other arrhythmia   Chronic back pain greater than 3 months duration    a. spinal stenosis.  Spinal fusion with rods in 2/15 at [facility] spinal fusion 9/15.   Chronic diastolic CHF    Echo 0/37:  EF 50-55%, trivial AI, midl MR, mod LAE, PASP 37 mmHg //  Echocardiogram 03/2020: EF 45-50, no RWMA, mild LVH, mild reduced RVSF, mildly elevated PASP (RVSP 38.3), mild LAE, mild MR, mild to mod TR, trivial AI // Echo 9/21: EF 45, mild LVH, mildly reduced RV SF, moderate LAE, mild RAE, mild MR, mild AI      Circadian rhythm sleep disorder    CTS (carpal tunnel syndrome)    Deficiency anemia 11/10/2014   Depression    Diarrhea    Diverticulitis of colon    Esophageal stricture    Gastritis    Gastroesophageal hernia 07/06/2013   GERD (gastroesophageal reflux disease)    Hiatal hernia    History of blood transfusion    "most of them related to OR's"    HTN (hypertension)    Hyperlipidemia    IBS (irritable bowel syndrome)    Incontinence 10/13/2012   Insomnia    Ischemic chest pain ([place])    Memory disorder 04/88/8916   Metabolic syndrome 94/50/3888   MGUS (monoclonal gammopathy of unknown significance) dx'd 11/2014   a. Neg BMB 11/2014.   Multiple falls    Obesity    Obstructive sleep apnea    "have mask; don't wear it" (01/07/2015)   Orthostasis    PAT (paroxysmal atrial tachycardia) ([place])    Personal history of colonic polyps 10/25/2011 & 12/02/11   not retrieved Dr Lyla Son & tubular adenomas   Pneumonia 03/2014   Sinus bradycardia    a. Baseline HR 50s-60s.   Stroke [facility]) early 2000's   "small"; denies residual on 01/07/2015)    Past Surgical History:  Procedure Laterality Date   BACK SURGERY     BONE MARROW BIOPSY  11/2014   CARDIAC CATHETERIZATION  12/27/2014   Procedure: INTRAVASCULAR PRESSURE WIRE/FFR STUDY;  Surgeon: Larey Dresser, MD;  Location: [facility] CATH LAB;  Service: Cardiovascular;;   CARDIAC CATHETERIZATION N/A 11/25/2016   Procedure: Left Heart Cath and Coronary Angiography;  Surgeon: Sherren Mocha, MD;  Location: [place] CV LAB;  Service: Cardiovascular;  Laterality: N/A;   CARDIOVERSION N/A 02/13/2016   Procedure: CARDIOVERSION;  Surgeon: Josue Hector, MD;  Location: [place];  Service: Cardiovascular;   Laterality: N/A;   CARPAL TUNNEL RELEASE Right 1980's   CATARACT EXTRACTION Bilateral    CATARACT EXTRACTION W/ INTRAOCULAR LENS  IMPLANT, BILATERAL  2000's   CORONARY ANGIOPLASTY WITH STENT PLACEMENT  01/07/2015   "2"   CORONARY STENT PLACEMENT     DILATION AND CURETTAGE OF UTERUS     ESOPHAGOGASTRODUODENOSCOPY (EGD) WITH ESOPHAGEAL DILATION  "several times"   JOINT REPLACEMENT     KNEE ARTHROSCOPY Left 1995   LAPAROSCOPIC CHOLECYSTECTOMY  2003   LEFT HEART CATHETERIZATION WITH CORONARY ANGIOGRAM N/A 12/27/2014   Procedure: LEFT HEART CATHETERIZATION WITH CORONARY ANGIOGRAM;  Surgeon: Larey Dresser, MD;  Location: [facility] CATH LAB;  Service: Cardiovascular;  Laterality: N/A;   PERCUTANEOUS CORONARY ROTOBLATOR INTERVENTION (PCI-R)  01/07/2015   PERCUTANEOUS CORONARY ROTOBLATOR INTERVENTION (PCI-R) N/A  01/07/2015   Procedure: PERCUTANEOUS CORONARY ROTOBLATOR INTERVENTION (PCI-R);  Surgeon: Blane Ohara, MD;  Location: [facility] CATH LAB;  Service: Cardiovascular;  Laterality: N/A;   POSTERIOR FUSION THORACIC SPINE  08/2015   POSTERIOR LUMBAR FUSION  01/2015   TOTAL KNEE ARTHROPLASTY Bilateral 1990's - 2000's    Social History   Socioeconomic History   Marital status: Widowed    Spouse name: Not on file   Number of children: 2   Years of education: HS   Highest education level: Not on file  Occupational History   Occupation: Licensed conveyancer- retired    Comment: retired  Tobacco Use   Smoking status: Never   Smokeless tobacco: Never  Vaping Use   Vaping Use: Never used  Substance and Sexual Activity   Alcohol use: No    Comment: 01/07/2015 "glass of wine at Christmas, maybe"   Drug use: No   Sexual activity: Never  Other Topics Concern   Not on file  Social History Narrative   Patient is right handed   Patient drinks 1-2 sodas daily.   patlient lives alone.   Social Determinants of Health   Financial Resource Strain: Not on file  Food Insecurity: Not on file  Transportation Needs: Not  on file  Physical Activity: Not on file  Stress: Not on file  Social Connections: Not on file  Intimate Partner Violence: Not on file   Family History  Problem Relation Age of Onset   Coronary artery disease Father    Peripheral vascular disease Father    Coronary artery disease Mother    Coronary artery disease Brother    Colon cancer Sister    Emphysema Sister    Sleep apnea Son    Colon cancer Sister        spread to her brain   Emphysema Brother    Dementia Neg Hx       VITAL SIGNS BP 131/69    Pulse 63    Temp (!) 97.4 F (36.3 C)    Resp 20    Ht 5' 6"  (1.676 m)    Wt 169 lb (76.7 kg)    SpO2 97%    BMI 27.28 kg/m   Outpatient Encounter Medications as of 02/27/2022  Medication Sig   albuterol (VENTOLIN HFA) 108 (90 Base) MCG/ACT inhaler Inhale 2 puffs into the lungs every 6 (six) hours as needed for wheezing or shortness of breath.   amoxicillin-clavulanate (AUGMENTIN) 875-125 MG tablet Take 1 tablet by mouth 2 (two) times daily.   aspirin EC 81 MG tablet Take 1 tablet (81 mg total) by mouth daily.   diltiazem (DILACOR XR) 240 MG 24 hr capsule Take 240 mg by mouth in the morning and at bedtime.   ELIQUIS 5 MG TABS tablet Take 1 tablet (5 mg total) by mouth 2 (two) times daily.   eszopiclone 3 MG TABS Take 1 tablet (3 mg total) by mouth at bedtime. Take immediately before bedtime   fentaNYL (DURAGESIC) 50 MCG/HR Place 1 patch onto the skin every 3 (three) days.   FLUoxetine (PROZAC) 40 MG capsule Take 1 capsule (40 mg total) by mouth daily.   furosemide (LASIX) 40 MG tablet Take 40 mg by mouth daily.   HYDROcodone-acetaminophen (NORCO) 10-325 MG tablet Take 1 tablet by mouth every 4 (four) hours as needed for moderate pain.   hydrOXYzine (ATARAX) 25 MG tablet Take 25 mg by mouth 3 (three) times daily as needed.   LINZESS 145 MCG CAPS capsule Take  145 mcg by mouth daily as needed (constipation).   loperamide (IMODIUM A-D) 2 MG tablet Take 4 mg by mouth 4 (four) times  daily as needed for diarrhea or loose stools.   meclizine (ANTIVERT) 25 MG tablet Take 25 mg by mouth 3 (three) times daily as needed for dizziness.   Methocarbamol 1000 MG TABS Take 1,000 mg by mouth 3 (three) times daily.   nitroGLYCERIN (NITROSTAT) 0.4 MG SL tablet Place 1 tablet (0.4 mg total) under the tongue every 5 (five) minutes as needed for chest pain (up to 3 doses).   pantoprazole (PROTONIX) 40 MG tablet Take 1 tablet (40 mg total) by mouth 2 (two) times daily.   rosuvastatin (CRESTOR) 10 MG tablet Take 10 mg by mouth every other day.   rosuvastatin (CRESTOR) 5 MG tablet Take 5 mg by mouth every other day.   VITAMIN D, ERGOCALCIFEROL, PO Take 1,000 Units by mouth daily.   clobetasol cream (TEMOVATE) 8.67 % Apply 1 application topically See admin instructions. Applies to vaginal area twice a week.   diltiazem (TIAZAC) 240 MG 24 hr capsule diltiazem CD 240 mg capsule,extended release 24 hr   diphenoxylate-atropine (LOMOTIL) 2.5-0.025 MG tablet Take 1-2 tablets by mouth 4 (four) times daily as needed for diarrhea or loose stools.   No facility-administered encounter medications on file as of 02/27/2022.     SIGNIFICANT DIAGNOSTIC EXAMS  TODAY  02-23-22: TEE:  The left ventricular size is normal.  Mild left ventricular hypertrophy  LV ejection fraction = 45-50%.  Left ventricular systolic function is mildly reduced.  The right ventricle is normal in size and function.  The left atrium is moderately to severely dilated.  The right atrium is mildly to moderately dilated.  Estimated right ventricular systolic pressure is 27 mmHg.  IVC size was normal.  There is no significant valvular stenosis or regurgitation.  There is no pericardial effusion.  There is a pleural effusion present.  Recommend a limited TTE when HR is controlled to reassess   LABS REVIEWED:   02-27-22: glucose 102; bun 13; creat 0.52; k+ 4.8; na++ 134; ca 8.9; GFR>90   Review of Systems  Constitutional:   Negative for malaise/fatigue.  Respiratory:  Negative for cough and shortness of breath.   Cardiovascular:  Negative for chest pain, palpitations and leg swelling.  Gastrointestinal:  Negative for abdominal pain, constipation and heartburn.  Musculoskeletal:  Positive for back pain. Negative for joint pain and myalgias.  Skin: Negative.   Neurological:  Negative for dizziness.  Psychiatric/Behavioral:  The patient is not nervous/anxious.    Physical Exam Constitutional:      General: She is not in acute distress.    Appearance: She is well-developed. She is not diaphoretic.  Neck:     Thyroid: No thyromegaly.  Cardiovascular:     Rate and Rhythm: Normal rate. Rhythm irregular.     Pulses: Normal pulses.     Heart sounds: Normal heart sounds.  Pulmonary:     Effort: Pulmonary effort is normal. No respiratory distress.     Breath sounds: Normal breath sounds.  Abdominal:     General: Bowel sounds are normal. There is no distension.     Palpations: Abdomen is soft.     Tenderness: There is no abdominal tenderness.  Musculoskeletal:        General: Normal range of motion.     Cervical back: Neck supple.     Right lower leg: No edema.     Left lower leg:  No edema.  Lymphadenopathy:     Cervical: No cervical adenopathy.  Skin:    General: Skin is warm and dry.     Comments: Incision sites without signs of infection present   Neurological:     Mental Status: She is alert and oriented to person, place, and time.  Psychiatric:        Mood and Affect: Mood normal.      ASSESSMENT/ PLAN:  TODAY  Esophageal dysphasia; gastroesophageal reflux disease unspecified whether esophagitis is present; paraesophageal hiatal hernia: status post repair; will continue protonix 40 mg daily   2. Irritable bowel syndrome with constipation and diarrhea: will continue linzess 145 mcg daily has lomotil four times daily as needed; imodium 2 mg four times daily as needed.   3. Permanent atrial  fibrillation: heart rate is irregular; will continue diltiazem cd 240 mg twice daily will continue asa 81 mg daily eliquis 5 mg twice daily   4. Chronic diastolic CHF (Congestive heart failure) EF 45-50%; is stable will continue lasix 40 mg daily  5. Obstructive sleep apnea: is stable   6. DDD (degenerative disc disease) lumbar /chronic pain syndrome: will continue fentanyl 50 mcg every 3 days; vicodin 10/325 mg every 4 hours as needed  robaxin 1 gm three times daily  has narcon available  7. Primary insomnia: will continue lunesta 3 mg nightly   8. Major depression recurrent chronic: will continue prozac 40 mg daily   9. Hyperlipidemia LDL goal <100: will continue alternating doses of crestor 5 mg/10 mg   10. Aspiration due to gastric secretions unspecified laterality unspecified lobe/complicated UTI: will complete augmentin and will monitor her status.   11. Coronary artery disease involving native artery native heart with angina pectoris: is stable has prn ntg.   Will check cbc cmp    Ok Edwards NP [facility]   call [phone]

## 2022-03-04 ENCOUNTER — Encounter: Payer: Self-pay | Admitting: Internal Medicine

## 2022-03-04 ENCOUNTER — Non-Acute Institutional Stay (SKILLED_NURSING_FACILITY): Payer: Medicare HMO | Admitting: Internal Medicine

## 2022-03-04 DIAGNOSIS — J690 Pneumonitis due to inhalation of food and vomit: Secondary | ICD-10-CM | POA: Diagnosis not present

## 2022-03-04 DIAGNOSIS — I4821 Permanent atrial fibrillation: Secondary | ICD-10-CM

## 2022-03-04 DIAGNOSIS — Z9189 Other specified personal risk factors, not elsewhere classified: Secondary | ICD-10-CM

## 2022-03-04 DIAGNOSIS — K449 Diaphragmatic hernia without obstruction or gangrene: Secondary | ICD-10-CM | POA: Diagnosis not present

## 2022-03-04 NOTE — Progress Notes (Signed)
? ?NURSING HOME LOCATION: [place] ?ROOM NUMBER:  158 P ? ?CODE STATUS:  Full Code ? ?PCP:  Ronnie Doss DO ? ?This is a comprehensive admission note to this SNFperformed on this date less than 30 days from date of admission. ?Included are preadmission medical/surgical history; reconciled medication list; family history; social history and comprehensive review of systems.  ?Corrections and additions to the records were documented. Comprehensive physical exam was also performed. Additionally a clinical summary was entered for each active diagnosis pertinent to this admission in the Problem List to enhance continuity of care. ? ?HPI: Patient was hospitalized 2/23 - 02/27/2022 at [facility] presenting with chest & generalized abdominal pain following EGD with Endoflip 2/23.  ?In the 6 months PTA she had had difficulty tolerating any liquid or solid intake with subjective immediate regurgitation after such.  The EGD had revealed an abnormal esophageal Endoflip with with persistent large hernia creating an esophagogastric junction outflow obstruction. ?Paraesophageal hernia repair was completed 2/28.  Initially she failed bedside swallow testing but subsequently Speech Therapy cleared her for full liquid diet.  This was tolerated without nausea or vomiting. ?APS consulted for pain management which was satisfactorily achieved with fentanyl patch 50 mcg every 72 hours as well as oxycodone 10 mg every 4 hours as needed. ?Postop course was complicated by A-fib with rapid ventricular response necessitating SICU admission for diltiazem drip.  She was weaned to oral diltiazem but required additional doses of IV metoprolol for rate control.  Postop she was transitioned from Lovenox to Eliquis which is a maintenance home medication.  TTE revealed EF of 45-50% with heart rate of 118.  Moderate-severe LAE was present with mild-moderate RAE.  Mild global hypokinesis of the left  ventricle was present. ?Clinically aspiration pneumonia with acute hypoxic respiratory failure was present in the context of the GI issues for which she received ceftriaxone and clindamycin.  She was able to be transitioned to room air at discharge. ?Prior to discharge she exhibited suprapubic pressure following Foley catheter removal.  UA was abnormal and she was started on CTX and transitioned subsequently to Augmentin to complete a 7-day course. ?Heart rate in the context of A-fib was felt to be well controlled on diltiazem 240 mg twice daily.  Prophylactic Eliquis and aspirin were continued. ?She was to be continued on a full liquid diet until 3/21, 3 weeks postop. ?At discharge H/H was 12.2/36.4 with normal platelet count.  Sodium was 134, creatinine 0.52, and glucose 102. ? ?Past medical and surgical history: Includes history of iron deficiency anemia, A-fib, CAD, chronic back pain, chronic diastolic congestive heart failure, anxiety/depression, history of GERD with esophageal stricture, essential hypertension, dyslipidemia, IBS, monoclonal gammopathy of unknown significance, OSA, history of stroke, and history of colon polyps. ?Surgeries & procedures include cardioversion, coronary angioplasty with stent placement, EGD, cholecystectomy, thoracic spine fusion, lumbar fusion, and bilateral TKAs. ? ?Social history: Nondrinker; non-smoker. ? ?Family history: Noncontributory due to advanced age. ?  ?Review of systems: Initially her daughter thought she was somnolent related to her pain medications.  When asked why she had been in the hospital she began dialogue about the extensive back surgery from "neck to my butt".  Her daughter corrected her that this was not the reason for the most recent admission and the patient did answer correctly that she had had "hiatal hernia repair".  She states that she is doing "terrible" by which she means she has "strangled twice because they are  crushing my medicines".  She states  that she has no trouble taking the pills with applesauce.  She has been coughing up mucus intermittently with the full liquid diet. ?She describes scant "runny stool". ?Dr. Herma Mering prescribes the opioids for the intractable back pain related to "a broken rod in my back".  Apparently her advanced comorbidities, chiefly cardiac in nature preclude additional back surgery. ?She is unaware of the A-fib. ? ?Eyes: No redness, discharge, pain, vision change ?ENT/mouth: No nasal congestion, purulent discharge, earache, change in hearing, sore throat  ?Cardiovascular: No chest pain, palpitations, paroxysmal nocturnal dyspnea, claudication, edema  ?Respiratory: No hemoptysis, significant snoring, apnea  ?Genitourinary: No dysuria, hematuria, pyuria, incontinence, nocturia ?Dermatologic: No rash, pruritus, change in appearance of skin ?Neurologic: No dizziness, headache, syncope, seizures ?Endocrine: No change in hair/skin/nails, excessive thirst, excessive hunger, excessive urination  ?Hematologic/lymphatic: No significant bruising, lymphadenopathy, abnormal bleeding ?Allergy/immunology: No itchy/watery eyes, significant sneezing, urticaria, angioedema ? ?Physical exam:  ?Pertinent or positive findings: She appears her age and chronically ill.  She is agitated and anxious and seemingly near tears.  She has bilateral ptosis laterally.  Facies are weathered.  She is missing multiple maxillary and mandibular teeth but not wearing partials.  The oropharynx reveals bland erythema and suggestion of a punctate area of candidiasis.  Heart rate is somewhat distant and irregular.  Breath sounds are markedly decreased.  Bowel sounds are surprisingly active.  She has trace edema at the sock line.  Dorsalis pedis pulses are stronger than posterior tibial pulses but all pedal pulses are decreased.  She has flexion of the third right finger for which she is wearing a soft brace.  There is faint ecchymosis of the dorsum of the right  hand. ? ?General appearance: no acute distress, increased work of breathing is present.   ?Lymphatic: No lymphadenopathy about the head, neck, axilla. ?Eyes: No conjunctival inflammation or lid edema is present. There is no scleral icterus. ?Ears:  External ear exam shows no significant lesions or deformities.   ?Nose:  External nasal examination shows no deformity or inflammation. Nasal mucosa are pink and moist without lesions, exudates ?Neck:  No thyromegaly, masses, tenderness noted.    ?Heart:  No gallop, murmur, click, rub.  ?Lungs: without wheezes, rhonchi, rales, rubs. ?Abdomen:  Abdomen is soft and nontender with no organomegaly, hernias, masses. ?GU: Deferred  ?Extremities:  No cyanosis, clubbing. ?Neurologic exam:  Balance, Rhomberg, finger to nose testing could not be completed due to clinical state ?Skin: Warm & dry w/o tenting. ?No significant rash. ? ?See clinical summary under each active problem in the Problem List with associated updated therapeutic plan ? ?

## 2022-03-04 NOTE — Assessment & Plan Note (Signed)
She continues to have intermittent strangling despite full liquid diet.  She is requesting that pills be administered with applesauce.  This will be discussed with staff. ?

## 2022-03-04 NOTE — Assessment & Plan Note (Signed)
Rate control is currently adequate on high-dose CCB.  Prophylactic Eliquis and aspirin will be continued. ?

## 2022-03-05 ENCOUNTER — Other Ambulatory Visit (HOSPITAL_COMMUNITY)
Admission: RE | Admit: 2022-03-05 | Discharge: 2022-03-05 | Disposition: A | Discharge status: Home / Self Care | Payer: Medicare HMO | Source: Skilled Nursing Facility | Attending: Adult Health | Admitting: Adult Health

## 2022-03-05 ENCOUNTER — Encounter: Payer: Self-pay | Admitting: Internal Medicine

## 2022-03-05 DIAGNOSIS — I10 Essential (primary) hypertension: Secondary | ICD-10-CM | POA: Insufficient documentation

## 2022-03-05 LAB — CBC
HCT: 32.6 % — ABNORMAL LOW (ref 36.0–46.0)
Hemoglobin: 10 g/dL — ABNORMAL LOW (ref 12.0–15.0)
MCH: 29.4 pg (ref 26.0–34.0)
MCHC: 30.7 g/dL (ref 30.0–36.0)
MCV: 95.9 fL (ref 80.0–100.0)
Platelets: 292 10*3/uL (ref 150–400)
RBC: 3.4 MIL/uL — ABNORMAL LOW (ref 3.87–5.11)
RDW: 13.6 % (ref 11.5–15.5)
WBC: 6 10*3/uL (ref 4.0–10.5)
nRBC: 0 % (ref 0.0–0.2)

## 2022-03-05 NOTE — Patient Instructions (Signed)
See assessment and plan under each diagnosis in the problem list and acutely for this visit 

## 2022-03-05 NOTE — Assessment & Plan Note (Addendum)
It would be futile to try to wean the opioids here at the SNF.  This will be deferred to the prescribing physician, Dr. Nelva Bush or her PCP.  If such is pursued; the weaning process will have to be protracted and slow. ?Consideration could be given to changing her antidepressant to duloxetine which may be beneficial with neuropathic pain. ?

## 2022-03-05 NOTE — Assessment & Plan Note (Signed)
At this time she is anxious to advance her diet as soon as possible and have the medicines administered without crushing. ?

## 2022-03-06 ENCOUNTER — Other Ambulatory Visit: Payer: Self-pay | Admitting: Adult Health

## 2022-03-06 ENCOUNTER — Non-Acute Institutional Stay (SKILLED_NURSING_FACILITY): Payer: Medicare HMO | Admitting: Adult Health

## 2022-03-06 ENCOUNTER — Encounter: Payer: Self-pay | Admitting: Adult Health

## 2022-03-06 DIAGNOSIS — I5032 Chronic diastolic (congestive) heart failure: Secondary | ICD-10-CM | POA: Diagnosis not present

## 2022-03-06 DIAGNOSIS — D649 Anemia, unspecified: Secondary | ICD-10-CM

## 2022-03-06 MED ORDER — HYDROCODONE-ACETAMINOPHEN 10-325 MG PO TABS: 1.0000 | ORAL_TABLET | ORAL | 0 refills | Status: DC | PRN

## 2022-03-06 NOTE — Progress Notes (Signed)
?Location:  [facility] Room Number: 158-P ?Place of Service:  SNF (31) ? ? ?CODE STATUS: Full Code ? ?Allergies  ?Allergen Reactions  ? Buprenorphine Hcl Itching  ? Cymbalta [Duloxetine Hcl] Other (See Comments)  ?  Other reaction(s): Other (See Comments) ?Personality changes - crying  2014  ? Morphine And Related Itching  ? Oxycodone-Acetaminophen Other (See Comments)  ?  Personality changes   ? Valium [Diazepam] Other (See Comments)  ?  Personality changes - "in another world" ;  Hallucinations  2015  ? Statins Other (See Comments)  ?  Other reaction(s): Other (See Comments) ?Muscle weakness ?Muscle weakness  ? Morphine Itching  ? Altace [Ramipril] Other (See Comments)  ?  unknown  ? Floxin [Ofloxacin] Other (See Comments)  ?  unknown  ? Hydromorphone Other (See Comments)  ?  Other reaction(s): Confusion (intolerance) ?unknown  ? Lovaza [Omega-3-Acid Ethyl Esters] Other (See Comments)  ?  Muscle weakness   ? Trilipix [Choline Fenofibrate] Other (See Comments)  ?  Muscle weakness   ? Zetia [Ezetimibe] Other (See Comments)  ?  Muscle weakness   ? ? ?Chief Complaint  ?Patient presents with  ? Acute Visit  ?  Weight gain  ? ? ?HPI: ? ?She has been gaining weight from 161.8 pounds on 02-27-22 to her current weight of 169 pounds. She denies any shortness of breath; no chest pain; no coughing; no worsening lower extremity edema. She is taking lasix 40 mg daily.  ? ?Past Medical History:  ?Diagnosis Date  ? Abnormal nuclear cardiac imaging test   ? Anemia   ? Anemia, iron deficiency 07/02/2015  ? Anxiety   ? Arthritis   ? "knees, back, fingers, toes; joints" (01/07/2015)  ? 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 CAD (coronary artery disease)   ? a. Abnl nuc 11/2014. Cath 12/2014 - turned down for CABG. Ultimately s/p TTVP, rotational atherectomy, PTCA and stenting of the ostial LCx and left main into the LAD (crush technique), and IVUS of the LAD/Left main. // b. Myoview 11/17: EF 48, poor  quality/significant artifact; inf-lateral, inferior ischemia; Intermediate Risk  ? Chronic atrial fibrillation (HCC)   ? a.  First noted post-op 9/15 spinal fusion.  She had cardioversion, not on anticoagulation. Fall risk, unsteady. // failed DCCV // Holter 10/17: AFib, Avg HR 97, PVCs, no other arrhythmia  ? Chronic back pain greater than 3 months duration   ? a. spinal stenosis.  Spinal fusion with rods in 2/15 at [facility] spinal fusion 9/15.  ? Chronic diastolic CHF   ? Echo 2/17:  EF 50-55%, trivial AI, midl MR, mod LAE, PASP 37 mmHg // Echocardiogram 03/2020: EF 45-50, no RWMA, mild LVH, mild reduced RVSF, mildly elevated PASP (RVSP 38.3), mild LAE, mild MR, mild to mod TR, trivial AI // Echo 9/21: EF 45, mild LVH, mildly reduced RV SF, moderate LAE, mild RAE, mild MR, mild AI     ? Circadian rhythm sleep disorder   ? CTS (carpal tunnel syndrome)   ? Deficiency anemia 11/10/2014  ? Depression   ? Diarrhea   ? Diverticulitis of colon   ? Esophageal stricture   ? Gastritis   ? Gastroesophageal hernia 07/06/2013  ? GERD (gastroesophageal reflux disease)   ? Hiatal hernia   ? History of blood transfusion   ? "most of them related to OR's"   ? HTN (hypertension)   ? Hyperlipidemia   ? IBS (irritable bowel syndrome)   ? Incontinence 10/13/2012  ?  Insomnia   ? Ischemic chest pain ([place])   ? Memory disorder 12/04/2014  ? Metabolic syndrome 24/40/1027  ? MGUS (monoclonal gammopathy of unknown significance) dx'd 11/2014  ? a. Neg BMB 11/2014.  ? Multiple falls   ? Obesity   ? Obstructive sleep apnea   ? "have mask; don't wear it" (01/07/2015)  ? Orthostasis   ? PAT (paroxysmal atrial tachycardia) ([place])   ? Personal history of colonic polyps 10/25/2011 & 12/02/11  ? not retrieved Dr Lyla Son & tubular adenomas  ? Pneumonia 03/2014  ? Sinus bradycardia   ? a. Baseline HR 50s-60s.  ? Stroke [facility]) early 2000's  ? "small"; denies residual on 01/07/2015)  ? ? ?Past Surgical History:  ?Procedure Laterality Date  ? BACK  SURGERY    ? BONE MARROW BIOPSY  11/2014  ? CARDIAC CATHETERIZATION  12/27/2014  ? Procedure: INTRAVASCULAR PRESSURE WIRE/FFR STUDY;  Surgeon: Larey Dresser, MD;  Location: [facility] CATH LAB;  Service: Cardiovascular;;  ? CARDIAC CATHETERIZATION N/A 11/25/2016  ? Procedure: Left Heart Cath and Coronary Angiography;  Surgeon: Sherren Mocha, MD;  Location: [facility] CV LAB;  Service: Cardiovascular;  Laterality: N/A;  ? CARDIOVERSION N/A 02/13/2016  ? Procedure: CARDIOVERSION;  Surgeon: Josue Hector, MD;  Location: [facility] ENDOSCOPY;  Service: Cardiovascular;  Laterality: N/A;  ? CARPAL TUNNEL RELEASE Right 1980's  ? CATARACT EXTRACTION Bilateral   ? CATARACT EXTRACTION W/ INTRAOCULAR LENS  IMPLANT, BILATERAL  2000's  ? CORONARY ANGIOPLASTY WITH STENT PLACEMENT  01/07/2015  ? "2"  ? CORONARY STENT PLACEMENT    ? DILATION AND CURETTAGE OF UTERUS    ? ESOPHAGOGASTRODUODENOSCOPY (EGD) WITH ESOPHAGEAL DILATION  "several times"  ? JOINT REPLACEMENT    ? KNEE ARTHROSCOPY Left 1995  ? LAPAROSCOPIC CHOLECYSTECTOMY  2003  ? LEFT HEART CATHETERIZATION WITH CORONARY ANGIOGRAM N/A 12/27/2014  ? Procedure: LEFT HEART CATHETERIZATION WITH CORONARY ANGIOGRAM;  Surgeon: Larey Dresser, MD;  Location: [facility] CATH LAB;  Service: Cardiovascular;  Laterality: N/A;  ? PERCUTANEOUS CORONARY ROTOBLATOR INTERVENTION (PCI-R)  01/07/2015  ? PERCUTANEOUS CORONARY ROTOBLATOR INTERVENTION (PCI-R) N/A 01/07/2015  ? Procedure: PERCUTANEOUS CORONARY ROTOBLATOR INTERVENTION (PCI-R);  Surgeon: Blane Ohara, MD;  Location: [facility] CATH LAB;  Service: Cardiovascular;  Laterality: N/A;  ? POSTERIOR FUSION THORACIC SPINE  08/2015  ? POSTERIOR LUMBAR FUSION  01/2015  ? TOTAL KNEE ARTHROPLASTY Bilateral 1990's - 2000's  ? ? ?Social History  ? ?Socioeconomic History  ? Marital status: Widowed  ?  Spouse name: Not on file  ? Number of children: 2  ? Years of education: HS  ? Highest education level: Not on file  ?Occupational History  ? Occupation: Licensed conveyancer- retired  ?   Comment: retired  ?Tobacco Use  ? Smoking status: Never  ? Smokeless tobacco: Never  ?Vaping Use  ? Vaping Use: Never used  ?Substance and Sexual Activity  ? Alcohol use: No  ?  Comment: 01/07/2015 "glass of wine at Christmas, maybe"  ? Drug use: No  ? Sexual activity: Not Currently  ?Other Topics Concern  ? Not on file  ?Social History Narrative  ? Patient is right handed  ? Patient drinks 1-2 sodas daily.  ? patlient lives alone.  ? ?Social Determinants of Health  ? ?Financial Resource Strain: Not on file  ?Food Insecurity: Not on file  ?Transportation Needs: Not on file  ?Physical Activity: Not on file  ?Stress: Not on file  ?Social Connections: Not on file  ?Intimate Partner Violence: Not on  file  ? ?Family History  ?Problem Relation Age of Onset  ? Coronary artery disease Father   ? Peripheral vascular disease Father   ? Coronary artery disease Mother   ? Coronary artery disease Brother   ? Colon cancer Sister   ? Emphysema Sister   ? Sleep apnea Son   ? Colon cancer Sister   ?     spread to her brain  ? Emphysema Brother   ? Dementia Neg Hx   ? ? ? ? ?VITAL SIGNS ?BP 119/60   Pulse 78   Temp (!) 97.4 ?F (36.3 ?C)   Resp 20   Ht _0  (1.676 m)   Wt 169 lb (76.7 kg)   SpO2 92%   BMI 27.28 kg/m?  ? ?Outpatient Encounter Medications as of 03/06/2022  ?Medication Sig  ? albuterol (VENTOLIN HFA) 108 (90 Base) MCG/ACT inhaler Inhale 2 puffs into the lungs every 6 (six) hours as needed for wheezing or shortness of breath.  ? aspirin EC 81 MG tablet Take 1 tablet (81 mg total) by mouth daily.  ? diltiazem (DILACOR XR) 240 MG 24 hr capsule Take 240 mg by mouth in the morning and at bedtime.  ? diphenoxylate-atropine (LOMOTIL) 2.5-0.025 MG tablet Take 1-2 tablets by mouth 4 (four) times daily as needed for diarrhea or loose stools.  ? ELIQUIS 5 MG TABS tablet Take 1 tablet (5 mg total) by mouth 2 (two) times daily.  ? eszopiclone 3 MG TABS Take 1 tablet (3 mg total) by mouth at bedtime. Take immediately before  bedtime  ? fentaNYL (DURAGESIC) 50 MCG/HR Place 1 patch onto the skin every 3 (three) days.  ? FLUoxetine (PROZAC) 40 MG capsule Take 1 capsule (40 mg total) by mouth daily.  ? furosemide (LASIX) 40 MG tablet Take 40 mg by mouth dai

## 2022-03-09 ENCOUNTER — Encounter: Payer: Self-pay | Admitting: Adult Health

## 2022-03-09 ENCOUNTER — Other Ambulatory Visit (HOSPITAL_COMMUNITY)
Admission: RE | Admit: 2022-03-09 | Discharge: 2022-03-09 | Disposition: A | Discharge status: Home / Self Care | Payer: Medicare HMO | Source: Skilled Nursing Facility | Attending: Internal Medicine | Admitting: Internal Medicine

## 2022-03-09 LAB — BASIC METABOLIC PANEL
Anion gap: 10 (ref 5–15)
BUN: 14 mg/dL (ref 8–23)
CO2: 30 mmol/L (ref 22–32)
Calcium: 9.5 mg/dL (ref 8.9–10.3)
Chloride: 97 mmol/L — ABNORMAL LOW (ref 98–111)
Creatinine, Ser: 0.75 mg/dL (ref 0.44–1.00)
GFR, Estimated: >60 mL/min (ref >60)
Glucose, Bld: 160 mg/dL — ABNORMAL HIGH (ref 70–99)
Potassium: 4.5 mmol/L (ref 3.5–5.1)
Sodium: 137 mmol/L (ref 135–145)

## 2022-03-09 NOTE — Progress Notes (Signed)
?Location:  [facility] Room Number: 158-P ?Place of Service:  SNF (31) ? ? ?CODE STATUS: Full Code ? ?Allergies  ?Allergen Reactions  ?? Buprenorphine Hcl Itching  ?? Cymbalta [Duloxetine Hcl] Other (See Comments)  ?  Other reaction(s): Other (See Comments) ?Personality changes - crying  2014  ?? Morphine And Related Itching  ?? Oxycodone-Acetaminophen Other (See Comments)  ?  Personality changes   ?? Valium [Diazepam] Other (See Comments)  ?  Personality changes - "in another world" ;  Hallucinations  2015  ?? Statins Other (See Comments)  ?  Other reaction(s): Other (See Comments) ?Muscle weakness ?Muscle weakness  ?? Morphine Itching  ?? Altace [Ramipril] Other (See Comments)  ?  unknown  ?? Floxin [Ofloxacin] Other (See Comments)  ?  unknown  ?? Hydromorphone Other (See Comments)  ?  Other reaction(s): Confusion (intolerance) ?unknown  ?? Lovaza [Omega-3-Acid Ethyl Esters] Other (See Comments)  ?  Muscle weakness   ?? Trilipix [Choline Fenofibrate] Other (See Comments)  ?  Muscle weakness   ?? Zetia [Ezetimibe] Other (See Comments)  ?  Muscle weakness   ? ? ?Chief Complaint  ?Patient presents with  ?? Acute Visit  ?  Family concerns  ? ? ?HPI: ? ? ? ?Past Medical History:  ?Diagnosis Date  ?? Abnormal nuclear cardiac imaging test   ?? Anemia   ?? Anemia, iron deficiency 07/02/2015  ?? Anxiety   ?? Arthritis   ? "knees, back, fingers, toes; joints" (01/07/2015)  ?? 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 CAD (coronary artery disease)   ? a. Abnl nuc 11/2014. Cath 12/2014 - turned down for CABG. Ultimately s/p TTVP, rotational atherectomy, PTCA and stenting of the ostial LCx and left main into the LAD (crush technique), and IVUS of the LAD/Left main. // b. Myoview 11/17: EF 48, poor quality/significant artifact; inf-lateral, inferior ischemia; Intermediate Risk  ?? Chronic atrial fibrillation (HCC)   ? a.  First noted post-op 9/15 spinal fusion.  She had cardioversion, not on anticoagulation. Fall  risk, unsteady. // failed DCCV // Holter 10/17: AFib, Avg HR 97, PVCs, no other arrhythmia  ?? Chronic back pain greater than 3 months duration   ? a. spinal stenosis.  Spinal fusion with rods in 2/15 at [facility] spinal fusion 9/15.  ?? Chronic diastolic CHF   ? Echo 2/17:  EF 50-55%, trivial AI, midl MR, mod LAE, PASP 37 mmHg // Echocardiogram 03/2020: EF 45-50, no RWMA, mild LVH, mild reduced RVSF, mildly elevated PASP (RVSP 38.3), mild LAE, mild MR, mild to mod TR, trivial AI // Echo 9/21: EF 45, mild LVH, mildly reduced RV SF, moderate LAE, mild RAE, mild MR, mild AI     ?? Circadian rhythm sleep disorder   ?? CTS (carpal tunnel syndrome)   ?? Deficiency anemia 11/10/2014  ?? Depression   ?? Diarrhea   ?? Diverticulitis of colon   ?? Esophageal stricture   ?? Gastritis   ?? Gastroesophageal hernia 07/06/2013  ?? GERD (gastroesophageal reflux disease)   ?? Hiatal hernia   ?? History of blood transfusion   ? "most of them related to OR's"   ?? HTN (hypertension)   ?? Hyperlipidemia   ?? IBS (irritable bowel syndrome)   ?? Incontinence 10/13/2012  ?? Insomnia   ?? Ischemic chest pain ([place])   ?? Memory disorder 12/04/2014  ?? Metabolic syndrome 74/25/9563  ?? MGUS (monoclonal gammopathy of unknown significance) dx'd 11/2014  ? a. Neg BMB 11/2014.  ?? Multiple  falls   ?? Obesity   ?? Obstructive sleep apnea   ? "have mask; don't wear it" (01/07/2015)  ?? Orthostasis   ?? PAT (paroxysmal atrial tachycardia) ([place])   ?? Personal history of colonic polyps 10/25/2011 & 12/02/11  ? not retrieved Dr Lyla Son & tubular adenomas  ?? Pneumonia 03/2014  ?? Sinus bradycardia   ? a. Baseline HR 50s-60s.  ?? Stroke [facility]) early 2000's  ? "small"; denies residual on 01/07/2015)  ? ? ?Past Surgical History:  ?Procedure Laterality Date  ?? BACK SURGERY    ?? BONE MARROW BIOPSY  11/2014  ?? CARDIAC CATHETERIZATION  12/27/2014  ? Procedure: INTRAVASCULAR PRESSURE WIRE/FFR STUDY;  Surgeon: Larey Dresser, MD;  Location: [facility] CATH  LAB;  Service: Cardiovascular;;  ?? CARDIAC CATHETERIZATION N/A 11/25/2016  ? Procedure: Left Heart Cath and Coronary Angiography;  Surgeon: Sherren Mocha, MD;  Location: [facility] CV LAB;  Service: Cardiovascular;  Laterality: N/A;  ?? CARDIOVERSION N/A 02/13/2016  ? Procedure: CARDIOVERSION;  Surgeon: Josue Hector, MD;  Location: [facility] ENDOSCOPY;  Service: Cardiovascular;  Laterality: N/A;  ?? CARPAL TUNNEL RELEASE Right 1980's  ?? CATARACT EXTRACTION Bilateral   ?? CATARACT EXTRACTION W/ INTRAOCULAR LENS  IMPLANT, BILATERAL  2000's  ?? CORONARY ANGIOPLASTY WITH STENT PLACEMENT  01/07/2015  ? "2"  ?? CORONARY STENT PLACEMENT    ?? DILATION AND CURETTAGE OF UTERUS    ?? ESOPHAGOGASTRODUODENOSCOPY (EGD) WITH ESOPHAGEAL DILATION  "several times"  ?? JOINT REPLACEMENT    ?? KNEE ARTHROSCOPY Left 1995  ?? LAPAROSCOPIC CHOLECYSTECTOMY  2003  ?? LEFT HEART CATHETERIZATION WITH CORONARY ANGIOGRAM N/A 12/27/2014  ? Procedure: LEFT HEART CATHETERIZATION WITH CORONARY ANGIOGRAM;  Surgeon: Larey Dresser, MD;  Location: [facility] CATH LAB;  Service: Cardiovascular;  Laterality: N/A;  ?? PERCUTANEOUS CORONARY ROTOBLATOR INTERVENTION (PCI-R)  01/07/2015  ?? PERCUTANEOUS CORONARY ROTOBLATOR INTERVENTION (PCI-R) N/A 01/07/2015  ? Procedure: PERCUTANEOUS CORONARY ROTOBLATOR INTERVENTION (PCI-R);  Surgeon: Blane Ohara, MD;  Location: [facility] CATH LAB;  Service: Cardiovascular;  Laterality: N/A;  ?? POSTERIOR FUSION THORACIC SPINE  08/2015  ?? POSTERIOR LUMBAR FUSION  01/2015  ?? TOTAL KNEE ARTHROPLASTY Bilateral 1990's - 2000's  ? ? ?Social History  ? ?Socioeconomic History  ?? Marital status: Widowed  ?  Spouse name: Not on file  ?? Number of children: 2  ?? Years of education: HS  ?? Highest education level: Not on file  ?Occupational History  ?? Occupation: Licensed conveyancer- retired  ?  Comment: retired  ?Tobacco Use  ?? Smoking status: Never  ?? Smokeless tobacco: Never  ?Vaping Use  ?? Vaping Use: Never used  ?Substance and Sexual Activity  ??  Alcohol use: No  ?  Comment: 01/07/2015 "glass of wine at Christmas, maybe"  ?? Drug use: No  ?? Sexual activity: Not Currently  ?Other Topics Concern  ?? Not on file  ?Social History Narrative  ? Patient is right handed  ? Patient drinks 1-2 sodas daily.  ? patlient lives alone.  ? ?Social Determinants of Health  ? ?Financial Resource Strain: Not on file  ?Food Insecurity: Not on file  ?Transportation Needs: Not on file  ?Physical Activity: Not on file  ?Stress: Not on file  ?Social Connections: Not on file  ?Intimate Partner Violence: Not on file  ? ?Family History  ?Problem Relation Age of Onset  ?? Coronary artery disease Father   ?? Peripheral vascular disease Father   ?? Coronary artery disease Mother   ?? Coronary artery disease Brother   ??  Colon cancer Sister   ?? Emphysema Sister   ?? Sleep apnea Son   ?? Colon cancer Sister   ?     spread to her brain  ?? Emphysema Brother   ?? Dementia Neg Hx   ? ? ? ? ?VITAL SIGNS ?BP 134/66   Pulse 83   Temp 98 ?F (36.7 ?C)   Resp 20   Ht 5' 6"  (1.676 m)   Wt 168 lb 3.2 oz (76.3 kg)   SpO2 100%   BMI 27.15 kg/m?  ? ?Outpatient Encounter Medications as of 03/09/2022  ?Medication Sig  ?? albuterol (VENTOLIN HFA) 108 (90 Base) MCG/ACT inhaler Inhale 2 puffs into the lungs every 6 (six) hours as needed for wheezing or shortness of breath.  ?? aspirin EC 81 MG tablet Take 1 tablet (81 mg total) by mouth daily.  ?? diltiazem (DILACOR XR) 240 MG 24 hr capsule Take 240 mg by mouth in the morning and at bedtime.  ?? diphenoxylate-atropine (LOMOTIL) 2.5-0.025 MG tablet Take 1-2 tablets by mouth 4 (four) times daily as needed for diarrhea or loose stools.  ?? ELIQUIS 5 MG TABS tablet Take 1 tablet (5 mg total) by mouth 2 (two) times daily.  ?? eszopiclone 3 MG TABS Take 1 tablet (3 mg total) by mouth at bedtime. Take immediately before bedtime  ?? fentaNYL (DURAGESIC) 50 MCG/HR Place 1 patch onto the skin every 3 (three) days.  ?? furosemide (LASIX) 40 MG tablet Take 40 mg  by mouth daily.  ?? HYDROcodone-acetaminophen (NORCO) 10-325 MG tablet Take 1 tablet by mouth every 4 (four) hours as needed for moderate pain.  ?? hydrOXYzine (ATARAX) 25 MG tablet Take 25 mg by mouth 3 (three

## 2022-03-10 ENCOUNTER — Other Ambulatory Visit: Payer: Self-pay | Admitting: Adult Health

## 2022-03-10 MED ORDER — DIPHENOXYLATE-ATROPINE 2.5-0.025 MG PO TABS: 1.0000 | ORAL_TABLET | Freq: Four times a day (QID) | ORAL | 0 refills | Status: DC | PRN

## 2022-03-12 ENCOUNTER — Other Ambulatory Visit: Payer: Self-pay

## 2022-03-12 DIAGNOSIS — D509 Iron deficiency anemia, unspecified: Secondary | ICD-10-CM

## 2022-03-12 DIAGNOSIS — D500 Iron deficiency anemia secondary to blood loss (chronic): Secondary | ICD-10-CM

## 2022-03-13 ENCOUNTER — Other Ambulatory Visit: Payer: Self-pay | Admitting: Adult Health

## 2022-03-13 ENCOUNTER — Encounter: Payer: Self-pay | Admitting: Cardiovascular Disease

## 2022-03-13 ENCOUNTER — Encounter: Payer: Self-pay | Admitting: Adult Health

## 2022-03-13 ENCOUNTER — Non-Acute Institutional Stay (SKILLED_NURSING_FACILITY): Payer: Medicare HMO | Admitting: Adult Health

## 2022-03-13 DIAGNOSIS — I5032 Chronic diastolic (congestive) heart failure: Secondary | ICD-10-CM

## 2022-03-13 DIAGNOSIS — G894 Chronic pain syndrome: Secondary | ICD-10-CM

## 2022-03-13 DIAGNOSIS — I4821 Permanent atrial fibrillation: Secondary | ICD-10-CM

## 2022-03-13 MED ORDER — FENTANYL 50 MCG/HR TD PT72: 1.0000 | MEDICATED_PATCH | TRANSDERMAL | 0 refills | Status: DC

## 2022-03-13 NOTE — Progress Notes (Signed)
?Location:  [place] ?Nursing Home Room Number: 158-P ?Place of Service:  SNF (31) ? ? ?CODE STATUS: Full Code ? ?Allergies  ?Allergen Reactions  ? Buprenorphine Hcl Itching  ? Cymbalta [Duloxetine Hcl] Other (See Comments)  ?  Other reaction(s): Other (See Comments) ?Personality changes - crying  2014  ? Morphine And Related Itching  ? Oxycodone-Acetaminophen Other (See Comments)  ?  Personality changes   ? Valium [Diazepam] Other (See Comments)  ?  Personality changes - "in another world" ;  Hallucinations  2015  ? Statins Other (See Comments)  ?  Other reaction(s): Other (See Comments) ?Muscle weakness ?Muscle weakness  ? Morphine Itching  ? Altace [Ramipril] Other (See Comments)  ?  unknown  ? Floxin [Ofloxacin] Other (See Comments)  ?  unknown  ? Hydromorphone Other (See Comments)  ?  Other reaction(s): Confusion (intolerance) ?unknown  ? Lovaza [Omega-3-Acid Ethyl Esters] Other (See Comments)  ?  Muscle weakness   ? Trilipix [Choline Fenofibrate] Other (See Comments)  ?  Muscle weakness   ? Zetia [Ezetimibe] Other (See Comments)  ?  Muscle weakness   ? ? ?Chief Complaint  ?Patient presents with  ? Acute Visit  ?  Care plan meeting  ? ? ?HPI: ? ?We have come together for her care plan meeting. Family present. BIMS 13/15 mood 12/30: indicating depression. She is nonambulatory and has had not falls. She requires extensive assist with adl care. She is incontinent of bladder and bowel. Dietary: feeds self; soft foods; weight is stable. Therapy: setup for upper body with bathing and dressing lower body min assist; toileting supervision to contact guard. Walking 100 feet with rolling walker supervision to contact guard; due for home assessment next week.  She is having increased mucus when eating; is unable to take medications "they come back up". she continues to be followed for her chronic illnesses including:  Chronic diastolic congestive heart failure (CHF) Permanent atrial fibrillation   Chronic pain  syndrome ? ?Past Medical History:  ?Diagnosis Date  ? Abnormal nuclear cardiac imaging test   ? Anemia   ? Anemia, iron deficiency 07/02/2015  ? Anxiety   ? Arthritis   ? "knees, back, fingers, toes; joints" (01/07/2015)  ? 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 CAD (coronary artery disease)   ? a. Abnl nuc 11/2014. Cath 12/2014 - turned down for CABG. Ultimately s/p TTVP, rotational atherectomy, PTCA and stenting of the ostial LCx and left main into the LAD (crush technique), and IVUS of the LAD/Left main. // b. Myoview 11/17: EF 48, poor quality/significant artifact; inf-lateral, inferior ischemia; Intermediate Risk  ? Chronic atrial fibrillation (HCC)   ? a.  First noted post-op 9/15 spinal fusion.  She had cardioversion, not on anticoagulation. Fall risk, unsteady. // failed DCCV // Holter 10/17: AFib, Avg HR 97, PVCs, no other arrhythmia  ? Chronic back pain greater than 3 months duration   ? a. spinal stenosis.  Spinal fusion with rods in 2/15 at [facility] fusion 9/15.  ? Chronic diastolic CHF   ? Echo 2/17:  EF 50-55%, trivial AI, midl MR, mod LAE, PASP 37 mmHg // Echocardiogram 03/2020: EF 45-50, no RWMA, mild LVH, mild reduced RVSF, mildly elevated PASP (RVSP 38.3), mild LAE, mild MR, mild to mod TR, trivial AI // Echo 9/21: EF 45, mild LVH, mildly reduced RV SF, moderate LAE, mild RAE, mild MR, mild AI     ? Circadian rhythm sleep disorder   ? CTS (carpal tunnel  syndrome)   ? Deficiency anemia 11/10/2014  ? Depression   ? Diarrhea   ? Diverticulitis of colon   ? Esophageal stricture   ? Gastritis   ? Gastroesophageal hernia 07/06/2013  ? GERD (gastroesophageal reflux disease)   ? Hiatal hernia   ? History of blood transfusion   ? "most of them related to OR's"   ? HTN (hypertension)   ? Hyperlipidemia   ? IBS (irritable bowel syndrome)   ? Incontinence 10/13/2012  ? Insomnia   ? Ischemic chest pain ([place])   ? Memory disorder 12/04/2014  ? Metabolic syndrome 98/33/8250  ? MGUS (monoclonal gammopathy  of unknown significance) dx'd 11/2014  ? a. Neg BMB 11/2014.  ? Multiple falls   ? Obesity   ? Obstructive sleep apnea   ? "have mask; don't wear it" (01/07/2015)  ? Orthostasis   ? PAT (paroxysmal atrial tachycardia) ([place])   ? Personal history of colonic polyps 10/25/2011 & 12/02/11  ? not retrieved Dr Lyla Son & tubular adenomas  ? Pneumonia 03/2014  ? Sinus bradycardia   ? a. Baseline HR 50s-60s.  ? Stroke [facility]) early 2000's  ? "small"; denies residual on 01/07/2015)  ? ? ?Past Surgical History:  ?Procedure Laterality Date  ? BACK SURGERY    ? BONE MARROW BIOPSY  11/2014  ? CARDIAC CATHETERIZATION  12/27/2014  ? Procedure: INTRAVASCULAR PRESSURE WIRE/FFR STUDY;  Surgeon: Larey Dresser, MD;  Location: [facility] CATH LAB;  Service: Cardiovascular;;  ? CARDIAC CATHETERIZATION N/A 11/25/2016  ? Procedure: Left Heart Cath and Coronary Angiography;  Surgeon: Sherren Mocha, MD;  Location: [place] CV LAB;  Service: Cardiovascular;  Laterality: N/A;  ? CARDIOVERSION N/A 02/13/2016  ? Procedure: CARDIOVERSION;  Surgeon: Josue Hector, MD;  Location: [facility] ENDOSCOPY;  Service: Cardiovascular;  Laterality: N/A;  ? CARPAL TUNNEL RELEASE Right 1980's  ? CATARACT EXTRACTION Bilateral   ? CATARACT EXTRACTION W/ INTRAOCULAR LENS  IMPLANT, BILATERAL  2000's  ? CORONARY ANGIOPLASTY WITH STENT PLACEMENT  01/07/2015  ? "2"  ? CORONARY STENT PLACEMENT    ? DILATION AND CURETTAGE OF UTERUS    ? ESOPHAGOGASTRODUODENOSCOPY (EGD) WITH ESOPHAGEAL DILATION  "several times"  ? JOINT REPLACEMENT    ? KNEE ARTHROSCOPY Left 1995  ? LAPAROSCOPIC CHOLECYSTECTOMY  2003  ? LEFT HEART CATHETERIZATION WITH CORONARY ANGIOGRAM N/A 12/27/2014  ? Procedure: LEFT HEART CATHETERIZATION WITH CORONARY ANGIOGRAM;  Surgeon: Larey Dresser, MD;  Location: [facility] CATH LAB;  Service: Cardiovascular;  Laterality: N/A;  ? PERCUTANEOUS CORONARY ROTOBLATOR INTERVENTION (PCI-R)  01/07/2015  ? PERCUTANEOUS CORONARY ROTOBLATOR INTERVENTION (PCI-R) N/A 01/07/2015  ? Procedure:  PERCUTANEOUS CORONARY ROTOBLATOR INTERVENTION (PCI-R);  Surgeon: Blane Ohara, MD;  Location: [facility] CATH LAB;  Service: Cardiovascular;  Laterality: N/A;  ? POSTERIOR FUSION THORACIC SPINE  08/2015  ? POSTERIOR LUMBAR FUSION  01/2015  ? TOTAL KNEE ARTHROPLASTY Bilateral 1990's - 2000's  ? ? ?Social History  ? ?Socioeconomic History  ? Marital status: Widowed  ?  Spouse name: Not on file  ? Number of children: 2  ? Years of education: HS  ? Highest education level: Not on file  ?Occupational History  ? Occupation: Licensed conveyancer- retired  ?  Comment: retired  ?Tobacco Use  ? Smoking status: Never  ? Smokeless tobacco: Never  ?Vaping Use  ? Vaping Use: Never used  ?Substance and Sexual Activity  ? Alcohol use: No  ?  Comment: 01/07/2015 "glass of wine at Christmas, maybe"  ? Drug use: No  ?  Sexual activity: Not Currently  ?Other Topics Concern  ? Not on file  ?Social History Narrative  ? Patient is right handed  ? Patient drinks 1-2 sodas daily.  ? patlient lives alone.  ? ?Social Determinants of Health  ? ?Financial Resource Strain: Not on file  ?Food Insecurity: Not on file  ?Transportation Needs: Not on file  ?Physical Activity: Not on file  ?Stress: Not on file  ?Social Connections: Not on file  ?Intimate Partner Violence: Not on file  ? ?Family History  ?Problem Relation Age of Onset  ? Coronary artery disease Father   ? Peripheral vascular disease Father   ? Coronary artery disease Mother   ? Coronary artery disease Brother   ? Colon cancer Sister   ? Emphysema Sister   ? Sleep apnea Son   ? Colon cancer Sister   ?     spread to her brain  ? Emphysema Brother   ? Dementia Neg Hx   ? ? ? ? ?VITAL SIGNS ?BP (!) 141/82   Pulse 83   Temp 97.8 ?F (36.6 ?C)   Resp 20   Ht 5' 6"  (1.676 m)   Wt 170 lb 12.8 oz (77.5 kg)   SpO2 98%   BMI 27.57 kg/m?  ? ?Outpatient Encounter Medications as of 03/13/2022  ?Medication Sig  ? albuterol (VENTOLIN HFA) 108 (90 Base) MCG/ACT inhaler Inhale 2 puffs into the lungs every 6 (six)  hours as needed for wheezing or shortness of breath.  ? aspirin EC 81 MG tablet Take 1 tablet (81 mg total) by mouth daily.  ? diltiazem (DILACOR XR) 240 MG 24 hr capsule Take 240 mg by mouth in the morning and at be

## 2022-03-16 ENCOUNTER — Inpatient Hospital Stay: Payer: Medicare HMO | Admitting: Hematology

## 2022-03-16 ENCOUNTER — Other Ambulatory Visit: Payer: Self-pay | Admitting: Internal Medicine

## 2022-03-16 ENCOUNTER — Other Ambulatory Visit (HOSPITAL_COMMUNITY): Payer: Self-pay | Admitting: Internal Medicine

## 2022-03-16 ENCOUNTER — Inpatient Hospital Stay: Payer: Medicare HMO

## 2022-03-16 DIAGNOSIS — R1319 Other dysphagia: Secondary | ICD-10-CM

## 2022-03-17 ENCOUNTER — Other Ambulatory Visit: Payer: Self-pay | Admitting: Adult Health

## 2022-03-17 ENCOUNTER — Emergency Department (HOSPITAL_COMMUNITY)
Admission: EM | Admit: 2022-03-17 | Discharge: 2022-03-17 | Disposition: A | Discharge status: Other Acute Inpatient Hospital | Payer: Medicare HMO | Attending: Emergency Medicine | Admitting: Emergency Medicine

## 2022-03-17 ENCOUNTER — Emergency Department (HOSPITAL_COMMUNITY): Payer: Medicare HMO

## 2022-03-17 ENCOUNTER — Non-Acute Institutional Stay (SKILLED_NURSING_FACILITY): Payer: Medicare HMO | Admitting: Adult Health

## 2022-03-17 ENCOUNTER — Other Ambulatory Visit (HOSPITAL_COMMUNITY)
Admission: RE | Admit: 2022-03-17 | Discharge: 2022-03-17 | Disposition: A | Discharge status: Home / Self Care | Payer: Medicare HMO | Source: Skilled Nursing Facility | Attending: Adult Health | Admitting: Adult Health

## 2022-03-17 ENCOUNTER — Encounter: Payer: Self-pay | Admitting: Adult Health

## 2022-03-17 ENCOUNTER — Encounter (HOSPITAL_COMMUNITY): Payer: Self-pay

## 2022-03-17 ENCOUNTER — Other Ambulatory Visit: Payer: Self-pay

## 2022-03-17 DIAGNOSIS — I2510 Atherosclerotic heart disease of native coronary artery without angina pectoris: Secondary | ICD-10-CM | POA: Diagnosis not present

## 2022-03-17 DIAGNOSIS — Z7901 Long term (current) use of anticoagulants: Secondary | ICD-10-CM | POA: Diagnosis not present

## 2022-03-17 DIAGNOSIS — R1032 Left lower quadrant pain: Secondary | ICD-10-CM | POA: Diagnosis not present

## 2022-03-17 DIAGNOSIS — Z955 Presence of coronary angioplasty implant and graft: Secondary | ICD-10-CM | POA: Diagnosis not present

## 2022-03-17 DIAGNOSIS — R109 Unspecified abdominal pain: Secondary | ICD-10-CM | POA: Diagnosis not present

## 2022-03-17 DIAGNOSIS — R112 Nausea with vomiting, unspecified: Secondary | ICD-10-CM

## 2022-03-17 DIAGNOSIS — Z79899 Other long term (current) drug therapy: Secondary | ICD-10-CM | POA: Diagnosis not present

## 2022-03-17 DIAGNOSIS — K222 Esophageal obstruction: Secondary | ICD-10-CM

## 2022-03-17 DIAGNOSIS — R1084 Generalized abdominal pain: Secondary | ICD-10-CM

## 2022-03-17 DIAGNOSIS — Z96653 Presence of artificial knee joint, bilateral: Secondary | ICD-10-CM | POA: Diagnosis not present

## 2022-03-17 DIAGNOSIS — I4891 Unspecified atrial fibrillation: Secondary | ICD-10-CM | POA: Diagnosis not present

## 2022-03-17 DIAGNOSIS — N179 Acute kidney failure, unspecified: Secondary | ICD-10-CM | POA: Diagnosis not present

## 2022-03-17 DIAGNOSIS — R079 Chest pain, unspecified: Secondary | ICD-10-CM | POA: Diagnosis present

## 2022-03-17 DIAGNOSIS — I5032 Chronic diastolic (congestive) heart failure: Secondary | ICD-10-CM | POA: Insufficient documentation

## 2022-03-17 DIAGNOSIS — Z7982 Long term (current) use of aspirin: Secondary | ICD-10-CM | POA: Insufficient documentation

## 2022-03-17 DIAGNOSIS — I10 Essential (primary) hypertension: Secondary | ICD-10-CM | POA: Insufficient documentation

## 2022-03-17 DIAGNOSIS — I110 Hypertensive heart disease with heart failure: Secondary | ICD-10-CM | POA: Insufficient documentation

## 2022-03-17 LAB — COMPREHENSIVE METABOLIC PANEL
ALT: 50 U/L — ABNORMAL HIGH (ref 0–44)
AST: 27 U/L (ref 15–41)
Albumin: 3.6 g/dL (ref 3.5–5.0)
Alkaline Phosphatase: 217 U/L — ABNORMAL HIGH (ref 38–126)
Anion gap: 11 (ref 5–15)
BUN: 10 mg/dL (ref 8–23)
CO2: 27 mmol/L (ref 22–32)
Calcium: 9.3 mg/dL (ref 8.9–10.3)
Chloride: 101 mmol/L (ref 98–111)
Creatinine, Ser: 0.55 mg/dL (ref 0.44–1.00)
GFR, Estimated: >60 mL/min (ref >60)
Glucose, Bld: 137 mg/dL — ABNORMAL HIGH (ref 70–99)
Potassium: 3.5 mmol/L (ref 3.5–5.1)
Sodium: 139 mmol/L (ref 135–145)
Total Bilirubin: 0.9 mg/dL (ref 0.3–1.2)
Total Protein: 7.1 g/dL (ref 6.5–8.1)

## 2022-03-17 LAB — CBC
HCT: 42.2 % (ref 36.0–46.0)
Hemoglobin: 13 g/dL (ref 12.0–15.0)
MCH: 29.5 pg (ref 26.0–34.0)
MCHC: 30.8 g/dL (ref 30.0–36.0)
MCV: 95.7 fL (ref 80.0–100.0)
Platelets: 240 10*3/uL (ref 150–400)
RBC: 4.41 MIL/uL (ref 3.87–5.11)
RDW: 13.9 % (ref 11.5–15.5)
WBC: 12.1 10*3/uL — ABNORMAL HIGH (ref 4.0–10.5)
nRBC: 0 % (ref 0.0–0.2)

## 2022-03-17 LAB — HEPATIC FUNCTION PANEL
ALT: 50 U/L — ABNORMAL HIGH (ref 0–44)
AST: 30 U/L (ref 15–41)
Albumin: 3.8 g/dL (ref 3.5–5.0)
Alkaline Phosphatase: 222 U/L — ABNORMAL HIGH (ref 38–126)
Bilirubin, Direct: 0.4 mg/dL — ABNORMAL HIGH (ref 0.0–0.2)
Indirect Bilirubin: 0.9 mg/dL (ref 0.3–0.9)
Total Bilirubin: 1.3 mg/dL — ABNORMAL HIGH (ref 0.3–1.2)
Total Protein: 7.3 g/dL (ref 6.5–8.1)

## 2022-03-17 LAB — BASIC METABOLIC PANEL
Anion gap: 14 (ref 5–15)
BUN: 9 mg/dL (ref 8–23)
CO2: 28 mmol/L (ref 22–32)
Calcium: 9.4 mg/dL (ref 8.9–10.3)
Chloride: 98 mmol/L (ref 98–111)
Creatinine, Ser: 0.56 mg/dL (ref 0.44–1.00)
GFR, Estimated: >60 mL/min (ref >60)
Glucose, Bld: 182 mg/dL — ABNORMAL HIGH (ref 70–99)
Potassium: 4.1 mmol/L (ref 3.5–5.1)
Sodium: 140 mmol/L (ref 135–145)

## 2022-03-17 LAB — LIPASE, BLOOD
Lipase: 22 U/L (ref 11–51)
Lipase: 22 U/L (ref 11–51)

## 2022-03-17 LAB — TROPONIN I (HIGH SENSITIVITY)
Troponin I (High Sensitivity): 6 ng/L (ref <18)
Troponin I (High Sensitivity): 7 ng/L (ref <18)

## 2022-03-17 LAB — AMYLASE: Amylase: 144 U/L — ABNORMAL HIGH (ref 28–100)

## 2022-03-17 MED ORDER — PROCHLORPERAZINE EDISYLATE 10 MG/2ML IJ SOLN
5.0000 mg | Freq: Once | INTRAMUSCULAR | Status: AC
  Administered 2022-03-17: 5 mg via INTRAVENOUS
  Filled 2022-03-17: qty 2

## 2022-03-17 MED ORDER — SODIUM CHLORIDE 0.9 % IV BOLUS
500.0000 mL | Freq: Once | INTRAVENOUS | Status: AC
  Administered 2022-03-17: 500 mL via INTRAVENOUS

## 2022-03-17 MED ORDER — IOHEXOL 300 MG/ML  SOLN
100.0000 mL | Freq: Once | INTRAMUSCULAR | Status: AC | PRN
  Administered 2022-03-17: 100 mL via INTRAVENOUS

## 2022-03-17 MED ORDER — ESZOPICLONE 3 MG PO TABS
3.0000 mg | ORAL_TABLET | Freq: Every day | ORAL | 0 refills | Status: DC
Start: 2022-03-17 — End: 2022-04-17

## 2022-03-17 MED ORDER — PROCHLORPERAZINE EDISYLATE 10 MG/2ML IJ SOLN
10.0000 mg | Freq: Once | INTRAMUSCULAR | Status: AC
  Administered 2022-03-17: 10 mg via INTRAVENOUS
  Filled 2022-03-17: qty 2

## 2022-03-17 MED ORDER — SODIUM CHLORIDE 0.9 % IV SOLN
1.0000 g | Freq: Once | INTRAVENOUS | Status: AC
  Administered 2022-03-17: 1 g via INTRAVENOUS
  Filled 2022-03-17: qty 10

## 2022-03-17 MED ORDER — ONDANSETRON HCL 4 MG/2ML IJ SOLN
4.0000 mg | Freq: Once | INTRAMUSCULAR | Status: AC
  Administered 2022-03-17: 4 mg via INTRAVENOUS
  Filled 2022-03-17: qty 2

## 2022-03-17 NOTE — ED Provider Notes (Signed)
?[place] ?Provider Note ? ? ?CSN: [id number] ?Arrival date & time: 03/17/22  1253 ? ?  ? ?History ? ?Chief Complaint  ?Patient presents with  ? Chest Pain  ? ? ?[name] [name] is a 81 y.o. female. ? ? ?Chest Pain ?Associated symptoms: abdominal pain, nausea, vomiting and weakness   ?Patient presents with chest pain nausea vomiting.  Has had recent hiatal hernia repair.  Since then has had more difficulty eating and drinking.  Although was having issues with vomiting before.  Now vomiting even more.  Also abdominal pain.  Per the patient's daughter it feels now with the vomitus coming from lower as opposed up in her chest.  History of A-fib.  Not been able to keep the medicines down.  Patient feels miserable. ?  ?Past Surgical History:  ?Procedure Laterality Date  ? BACK SURGERY    ? BONE MARROW BIOPSY  11/2014  ? CARDIAC CATHETERIZATION  12/27/2014  ? Procedure: INTRAVASCULAR PRESSURE WIRE/FFR STUDY;  Surgeon: Larey Dresser, MD;  Location: [facility] CATH LAB;  Service: Cardiovascular;;  ? CARDIAC CATHETERIZATION N/A 11/25/2016  ? Procedure: Left Heart Cath and Coronary Angiography;  Surgeon: Sherren Mocha, MD;  Location: [place] CV LAB;  Service: Cardiovascular;  Laterality: N/A;  ? CARDIOVERSION N/A 02/13/2016  ? Procedure: CARDIOVERSION;  Surgeon: Josue Hector, MD;  Location: [facility] ENDOSCOPY;  Service: Cardiovascular;  Laterality: N/A;  ? CARPAL TUNNEL RELEASE Right 1980's  ? CATARACT EXTRACTION Bilateral   ? CATARACT EXTRACTION W/ INTRAOCULAR LENS  IMPLANT, BILATERAL  2000's  ? CORONARY ANGIOPLASTY WITH STENT PLACEMENT  01/07/2015  ? "2"  ? CORONARY STENT PLACEMENT    ? DILATION AND CURETTAGE OF UTERUS    ? ESOPHAGOGASTRODUODENOSCOPY (EGD) WITH ESOPHAGEAL DILATION  "several times"  ? JOINT REPLACEMENT    ? KNEE ARTHROSCOPY Left 1995  ? LAPAROSCOPIC CHOLECYSTECTOMY  2003  ? LEFT HEART CATHETERIZATION WITH CORONARY ANGIOGRAM N/A 12/27/2014  ? Procedure: LEFT HEART CATHETERIZATION WITH CORONARY  ANGIOGRAM;  Surgeon: Larey Dresser, MD;  Location: [facility] CATH LAB;  Service: Cardiovascular;  Laterality: N/A;  ? PERCUTANEOUS CORONARY ROTOBLATOR INTERVENTION (PCI-R)  01/07/2015  ? PERCUTANEOUS CORONARY ROTOBLATOR INTERVENTION (PCI-R) N/A 01/07/2015  ? Procedure: PERCUTANEOUS CORONARY ROTOBLATOR INTERVENTION (PCI-R);  Surgeon: Blane Ohara, MD;  Location: [facility] CATH LAB;  Service: Cardiovascular;  Laterality: N/A;  ? POSTERIOR FUSION THORACIC SPINE  08/2015  ? POSTERIOR LUMBAR FUSION  01/2015  ? TOTAL KNEE ARTHROPLASTY Bilateral 1990's - 2000's  ? ?Past Medical History:  ?Diagnosis Date  ? Abnormal nuclear cardiac imaging test   ? Anemia   ? Anemia, iron deficiency 07/02/2015  ? Anxiety   ? Arthritis   ? "knees, back, fingers, toes; joints" (01/07/2015)  ? 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 CAD (coronary artery disease)   ? a. Abnl nuc 11/2014. Cath 12/2014 - turned down for CABG. Ultimately s/p TTVP, rotational atherectomy, PTCA and stenting of the ostial LCx and left main into the LAD (crush technique), and IVUS of the LAD/Left main. // b. Myoview 11/17: EF 48, poor quality/significant artifact; inf-lateral, inferior ischemia; Intermediate Risk  ? Chronic atrial fibrillation (HCC)   ? a.  First noted post-op 9/15 spinal fusion.  She had cardioversion, not on anticoagulation. Fall risk, unsteady. // failed DCCV // Holter 10/17: AFib, Avg HR 97, PVCs, no other arrhythmia  ? Chronic back pain greater than 3 months duration   ? a. spinal stenosis.  Spinal fusion with rods in 2/15 [facility]  [place].Repeat spinal fusion 9/15.  ? Chronic diastolic CHF   ? Echo 2/17:  EF 50-55%, trivial AI, midl MR, mod LAE, PASP 37 mmHg // Echocardiogram 03/2020: EF 45-50, no RWMA, mild LVH, mild reduced RVSF, mildly elevated PASP (RVSP 38.3), mild LAE, mild MR, mild to mod TR, trivial AI // Echo 9/21: EF 45, mild LVH, mildly reduced RV SF, moderate LAE, mild RAE, mild MR, mild AI     ? Circadian rhythm sleep disorder   ? CTS (carpal tunnel  syndrome)   ? Deficiency anemia 11/10/2014  ? Depression   ? Diarrhea   ? Diverticulitis of colon   ? Esophageal stricture   ? Gastritis   ? Gastroesophageal hernia 07/06/2013  ? GERD (gastroesophageal reflux disease)   ? Hiatal hernia   ? History of blood transfusion   ? "most of them related to OR's"   ? HTN (hypertension)   ? Hyperlipidemia   ? IBS (irritable bowel syndrome)   ? Incontinence 10/13/2012  ? Insomnia   ? Ischemic chest pain ([place])   ? Memory disorder 12/04/2014  ? Metabolic syndrome 26/20/3559  ? MGUS (monoclonal gammopathy of unknown significance) dx'd 11/2014  ? a. Neg BMB 11/2014.  ? Multiple falls   ? Obesity   ? Obstructive sleep apnea   ? "have mask; don't wear it" (01/07/2015)  ? Orthostasis   ? PAT (paroxysmal atrial tachycardia) ([place])   ? Personal history of colonic polyps 10/25/2011 & 12/02/11  ? not retrieved Dr Lyla Son & tubular adenomas  ? Pneumonia 03/2014  ? Sinus bradycardia   ? a. Baseline HR 50s-60s.  ? Stroke [facility]) early 2000's  ? "small"; denies residual on 01/07/2015)  ? ? ? ?Home Medications ?Prior to Admission medications   ?Medication Sig Start Date End Date Taking? Authorizing Provider  ?albuterol (VENTOLIN HFA) 108 (90 Base) MCG/ACT inhaler Inhale 2 puffs into the lungs every 6 (six) hours as needed for wheezing or shortness of breath.    [provider]  ?aspirin EC 81 MG tablet Take 1 tablet (81 mg total) by mouth daily. 11/23/16   Isaiah Serge, NP  ?diltiazem (DILACOR XR) 240 MG 24 hr capsule Take 240 mg by mouth in the morning and at bedtime.    [provider]  ?diphenoxylate-atropine (LOMOTIL) 2.5-0.025 MG tablet Take 1-2 tablets by mouth 4 (four) times daily as needed for diarrhea or loose stools. 03/10/22   Gerlene Fee, NP  ?Arne [place] 5 MG TABS tablet Take 1 tablet (5 mg total) by mouth 2 (two) times daily. 11/26/21   [name], DO  ?eszopiclone 3 MG TABS Take 1 tablet (3 mg total) by mouth at bedtime. Take immediately before bedtime  03/17/22   Gerlene Fee, NP  ?fentaNYL (DURAGESIC) 50 MCG/HR Place 1 patch onto the skin every 3 (three) days. 03/13/22   Gerlene Fee, NP  ?FLUoxetine (PROZAC) 40 MG capsule Take 40 mg by mouth daily.    [provider]  ?furosemide (LASIX) 40 MG tablet Take 40 mg by mouth daily.    [provider]  ?HYDROcodone-acetaminophen (NORCO) 10-325 MG tablet Take 1 tablet by mouth every 4 (four) hours as needed for moderate pain. 03/06/22   Gerlene Fee, NP  ?hydrOXYzine (ATARAX) 25 MG tablet Take 25 mg by mouth 3 (three) times daily as needed.    [provider]  ?LINZESS 145 MCG CAPS capsule Take 145 mcg by mouth daily as needed (constipation). 07/13/21  [provider]  ?loperamide (IMODIUM A-D) 2 MG tablet Take 2 mg by mouth 4 (four) times daily as needed for diarrhea or loose stools.    [provider]  ?meclizine (ANTIVERT) 25 MG tablet Take 25 mg by mouth 3 (three) times daily as needed for dizziness.    [provider]  ?Methocarbamol 1000 MG TABS Take 1,000 mg by mouth 3 (three) times daily.    [provider]  ?naloxone [facility]) nasal spray 4 mg/0.1 mL Special Instructions: As needed for depressed respirations less than 8/minute. ?Notify provider if given. If no effectiveness or improvement, contact 911. ?As Needed    [provider]  ?nitroGLYCERIN (NITROSTAT) 0.4 MG SL tablet Place 1 tablet (0.4 mg total) under the tongue every 5 (five) minutes as needed for chest pain (up to 3 doses). 11/17/21   Imogene Burn, PA-C  ?NON FORMULARY Diet: Full Liquid Diet until 03/10/22 and then see provider for further orders.  ?Every Shift ?regular foods if she tolerates. May use liquids or soft foods as needed    [provider]  ?Nutritional Supplements (ENSURE ENLIVE PO) Three Times A Day,due to liquid diet    [provider]  ?pantoprazole (PROTONIX) 40 MG tablet Take 1 tablet (40 mg total) by mouth 2 (two) times daily.  11/26/21   [name], DO  ?simethicone (MYLICON) 40 WE/9.9BZ drops Take 40 mg by mouth 2 (two) times daily as needed for flatulence. mix with water    [provider]  ?triamcinolone cream [facility]

## 2022-03-17 NOTE — ED Notes (Signed)
50 MCG Fentanyl patch to right arm located.  ?

## 2022-03-17 NOTE — ED Triage Notes (Signed)
Patient via EMS from [facility] due to Chest Pain with nausea and voming that started this morning. Patient does have on fentanyl patch.  ?

## 2022-03-17 NOTE — ED Notes (Signed)
[facility]= room number      [facility] ?Give report to [phone] and if no answer the charge nurse number is [phone] ?

## 2022-03-17 NOTE — Progress Notes (Signed)
?Location:  [place] ?Nursing Home Room Number: 158 ?Place of Service:  SNF (31) ?Provider:  Ok Edwards, NP ? ? ?CODE STATUS: FULL CODE ? ?Allergies  ?Allergen Reactions  ? Buprenorphine Hcl Itching  ? Cymbalta [Duloxetine Hcl] Other (See Comments)  ?  Other reaction(s): Other (See Comments) ?Personality changes - crying  2014  ? Morphine And Related Itching  ? Oxycodone-Acetaminophen Other (See Comments)  ?  Personality changes   ? Valium [Diazepam] Other (See Comments)  ?  Personality changes - "in another world" ;  Hallucinations  2015  ? Statins Other (See Comments)  ?  Other reaction(s): Other (See Comments) ?Muscle weakness ?Muscle weakness  ? Morphine Itching  ? Altace [Ramipril] Other (See Comments)  ?  unknown  ? Floxin [Ofloxacin] Other (See Comments)  ?  unknown  ? Hydromorphone Other (See Comments)  ?  Other reaction(s): Confusion (intolerance) ?unknown  ? Lovaza [Omega-3-Acid Ethyl Esters] Other (See Comments)  ?  Muscle weakness   ? Trilipix [Choline Fenofibrate] Other (See Comments)  ?  Muscle weakness   ? Zetia [Ezetimibe] Other (See Comments)  ?  Muscle weakness   ? ? ?Chief Complaint  ?Patient presents with  ? Acute Visit  ?  Change in status  ? ? ?HPI: ? ?She is unable to take po. She is throwing up mucus is large amounts. She is having chills. She hs left lower quad tenderness and epigastric tenderness. Her skin turgor is poor. She is tachycardic is unable to take her cardizem due to her nausea.  ? ?Past Medical History:  ?Diagnosis Date  ? Abnormal nuclear cardiac imaging test   ? Anemia   ? Anemia, iron deficiency 07/02/2015  ? Anxiety   ? Arthritis   ? "knees, back, fingers, toes; joints" (01/07/2015)  ? 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 CAD (coronary artery disease)   ? a. Abnl nuc 11/2014. Cath 12/2014 - turned down for CABG. Ultimately s/p TTVP, rotational atherectomy, PTCA and stenting of the ostial LCx and left main into the LAD (crush technique), and IVUS of the LAD/Left  main. // b. Myoview 11/17: EF 48, poor quality/significant artifact; inf-lateral, inferior ischemia; Intermediate Risk  ? Chronic atrial fibrillation (HCC)   ? a.  First noted post-op 9/15 spinal fusion.  She had cardioversion, not on anticoagulation. Fall risk, unsteady. // failed DCCV // Holter 10/17: AFib, Avg HR 97, PVCs, no other arrhythmia  ? Chronic back pain greater than 3 months duration   ? a. spinal stenosis.  Spinal fusion with rods in 2/15 at [facility] spinal fusion 9/15.  ? Chronic diastolic CHF   ? Echo 2/17:  EF 50-55%, trivial AI, midl MR, mod LAE, PASP 37 mmHg // Echocardiogram 03/2020: EF 45-50, no RWMA, mild LVH, mild reduced RVSF, mildly elevated PASP (RVSP 38.3), mild LAE, mild MR, mild to mod TR, trivial AI // Echo 9/21: EF 45, mild LVH, mildly reduced RV SF, moderate LAE, mild RAE, mild MR, mild AI     ? Circadian rhythm sleep disorder   ? CTS (carpal tunnel syndrome)   ? Deficiency anemia 11/10/2014  ? Depression   ? Diarrhea   ? Diverticulitis of colon   ? Esophageal stricture   ? Gastritis   ? Gastroesophageal hernia 07/06/2013  ? GERD (gastroesophageal reflux disease)   ? Hiatal hernia   ? History of blood transfusion   ? "most of them related to OR's"   ? HTN (hypertension)   ? Hyperlipidemia   ?  IBS (irritable bowel syndrome)   ? Incontinence 10/13/2012  ? Insomnia   ? Ischemic chest pain ([place])   ? Memory disorder 12/04/2014  ? Metabolic syndrome 27/02/5008  ? MGUS (monoclonal gammopathy of unknown significance) dx'd 11/2014  ? a. Neg BMB 11/2014.  ? Multiple falls   ? Obesity   ? Obstructive sleep apnea   ? "have mask; don't wear it" (01/07/2015)  ? Orthostasis   ? PAT (paroxysmal atrial tachycardia) ([place])   ? Personal history of colonic polyps 10/25/2011 & 12/02/11  ? not retrieved Dr Lyla Son & tubular adenomas  ? Pneumonia 03/2014  ? Sinus bradycardia   ? a. Baseline HR 50s-60s.  ? Stroke [facility]) early 2000's  ? "small"; denies residual on 01/07/2015)  ? ? ?Past Surgical  History:  ?Procedure Laterality Date  ? BACK SURGERY    ? BONE MARROW BIOPSY  11/2014  ? CARDIAC CATHETERIZATION  12/27/2014  ? Procedure: INTRAVASCULAR PRESSURE WIRE/FFR STUDY;  Surgeon: Larey Dresser, MD;  Location: [facility] CATH LAB;  Service: Cardiovascular;;  ? CARDIAC CATHETERIZATION N/A 11/25/2016  ? Procedure: Left Heart Cath and Coronary Angiography;  Surgeon: Sherren Mocha, MD;  Location: [place] CV LAB;  Service: Cardiovascular;  Laterality: N/A;  ? CARDIOVERSION N/A 02/13/2016  ? Procedure: CARDIOVERSION;  Surgeon: Josue Hector, MD;  Location: [facility] ENDOSCOPY;  Service: Cardiovascular;  Laterality: N/A;  ? CARPAL TUNNEL RELEASE Right 1980's  ? CATARACT EXTRACTION Bilateral   ? CATARACT EXTRACTION W/ INTRAOCULAR LENS  IMPLANT, BILATERAL  2000's  ? CORONARY ANGIOPLASTY WITH STENT PLACEMENT  01/07/2015  ? "2"  ? CORONARY STENT PLACEMENT    ? DILATION AND CURETTAGE OF UTERUS    ? ESOPHAGOGASTRODUODENOSCOPY (EGD) WITH ESOPHAGEAL DILATION  "several times"  ? JOINT REPLACEMENT    ? KNEE ARTHROSCOPY Left 1995  ? LAPAROSCOPIC CHOLECYSTECTOMY  2003  ? LEFT HEART CATHETERIZATION WITH CORONARY ANGIOGRAM N/A 12/27/2014  ? Procedure: LEFT HEART CATHETERIZATION WITH CORONARY ANGIOGRAM;  Surgeon: Larey Dresser, MD;  Location: [facility] CATH LAB;  Service: Cardiovascular;  Laterality: N/A;  ? PERCUTANEOUS CORONARY ROTOBLATOR INTERVENTION (PCI-R)  01/07/2015  ? PERCUTANEOUS CORONARY ROTOBLATOR INTERVENTION (PCI-R) N/A 01/07/2015  ? Procedure: PERCUTANEOUS CORONARY ROTOBLATOR INTERVENTION (PCI-R);  Surgeon: Blane Ohara, MD;  Location: [facility] CATH LAB;  Service: Cardiovascular;  Laterality: N/A;  ? POSTERIOR FUSION THORACIC SPINE  08/2015  ? POSTERIOR LUMBAR FUSION  01/2015  ? TOTAL KNEE ARTHROPLASTY Bilateral 1990's - 2000's  ? ? ?Social History  ? ?Socioeconomic History  ? Marital status: Widowed  ?  Spouse name: Not on file  ? Number of children: 2  ? Years of education: HS  ? Highest education level: Not on file  ?Occupational History   ? Occupation: Licensed conveyancer- retired  ?  Comment: retired  ?Tobacco Use  ? Smoking status: Never  ? Smokeless tobacco: Never  ?Vaping Use  ? Vaping Use: Never used  ?Substance and Sexual Activity  ? Alcohol use: No  ?  Comment: 01/07/2015 "glass of wine at Christmas, maybe"  ? Drug use: No  ? Sexual activity: Not Currently  ?Other Topics Concern  ? Not on file  ?Social History Narrative  ? Patient is right handed  ? Patient drinks 1-2 sodas daily.  ? patlient lives alone.  ? ?Social Determinants of Health  ? ?Financial Resource Strain: Not on file  ?Food Insecurity: Not on file  ?Transportation Needs: Not on file  ?Physical Activity: Not on file  ?Stress: Not on file  ?  Social Connections: Not on file  ?Intimate Partner Violence: Not on file  ? ?Family History  ?Problem Relation Age of Onset  ? Coronary artery disease Father   ? Peripheral vascular disease Father   ? Coronary artery disease Mother   ? Coronary artery disease Brother   ? Colon cancer Sister   ? Emphysema Sister   ? Sleep apnea Son   ? Colon cancer Sister   ?     spread to her brain  ? Emphysema Brother   ? Dementia Neg Hx   ? ? ? ? ?VITAL SIGNS ?BP 131/72   Pulse 67   Temp 98.2 ?F (36.8 ?C)   Resp 16   Ht _0  (1.676 m)   Wt 169 lb (76.7 kg)   SpO2 97%   BMI 27.28 kg/m?  ? ?Facility-Administered Encounter Medications as of 03/17/2022  ?Medication  ? sodium chloride 0.9 % bolus 500 mL  ? ?Outpatient Encounter Medications as of 03/17/2022  ?Medication Sig  ? albuterol (VENTOLIN HFA) 108 (90 Base) MCG/ACT inhaler Inhale 2 puffs into the lungs every 6 (six) hours as needed for wheezing or shortness of breath.  ? diltiazem (DILACOR XR) 240 MG 24 hr capsule Take 240 mg by mouth in the morning and at bedtime.  ? diphenoxylate-atropine (LOMOTIL) 2.5-0.025 MG tablet Take 1-2 tablets by mouth 4 (four) times daily as needed for diarrhea or loose stools.  ? ELIQUIS 5 MG TABS tablet Take 1 tablet (5 mg total) by mouth 2 (two) times daily.  ? eszopiclone 3 MG  TABS Take 1 tablet (3 mg total) by mouth at bedtime. Take immediately before bedtime  ? fentaNYL (DURAGESIC) 50 MCG/HR Place 1 patch onto the skin every 3 (three) days.  ? FLUoxetine (PROZAC) 20 MG/5ML solution Take

## 2022-03-18 NOTE — ED Provider Notes (Signed)
?  Physical Exam  ?BP (!) 104/54   Pulse 91   Temp 98.2 ?F (36.8 ?C)   Resp 17   Ht '5\' 6"'$  (1.676 m)   Wt 77.1 kg   SpO2 96%   BMI 27.44 kg/m?  ? ?Physical Exam ?Vitals and nursing note reviewed.  ?Constitutional:   ?   General: She is not in acute distress. ?   Appearance: She is well-developed.  ?HENT:  ?   Head: Normocephalic and atraumatic.  ?Eyes:  ?   Conjunctiva/sclera: Conjunctivae normal.  ?Cardiovascular:  ?   Rate and Rhythm: Normal rate and regular rhythm.  ?   Heart sounds: No murmur heard. ?Pulmonary:  ?   Effort: Pulmonary effort is normal. No respiratory distress.  ?   Breath sounds: Normal breath sounds.  ?Abdominal:  ?   Palpations: Abdomen is soft.  ?   Tenderness: There is abdominal tenderness.  ?Musculoskeletal:     ?   General: No swelling.  ?   Cervical back: Neck supple.  ?Skin: ?   General: Skin is warm and dry.  ?   Capillary Refill: Capillary refill takes less than 2 seconds.  ?Neurological:  ?   Mental Status: She is alert.  ?Psychiatric:     ?   Mood and Affect: Mood normal.  ? ? ?Procedures  ?Procedures ? ?ED Course / MDM  ?  ?Medical Decision Making ?Amount and/or Complexity of Data Reviewed ?Labs: ordered. ?Radiology: ordered. ? ?Risk ?Prescription drug management. ? ? ?Patient received in handoff.  History of recent hiatal hernia repair.  Unable to tolerate p.o.  CT abdomen pelvis showing large intrathoracic hematoma pushing against the esophagus.  Surgery performed by [facility] and I spoke with a Dr. Toney Rakes who is covering for the patient's primary surgeon who recommends transfer to [facility].  Patient then transferred to [facility].  CT also showing possible aspiration pneumonia and this was covered with ceftriaxone azithromycin. ? ? ? ? ?  ?Teressa Lower, MD ?03/18/22 0019 ? ?

## 2022-03-23 LAB — CBC WITH DIFFERENTIAL/PLATELET
Abs Immature Granulocytes: 0.01 10*3/uL (ref 0.00–0.07)
Basophils Absolute: 0 10*3/uL (ref 0.0–0.1)
Basophils Relative: 1 %
Eosinophils Absolute: 0.1 10*3/uL (ref 0.0–0.5)
Eosinophils Relative: 2 %
HCT: 41.5 % (ref 36.0–46.0)
Hemoglobin: 12.8 g/dL (ref 12.0–15.0)
Immature Granulocytes: 0 %
Lymphocytes Relative: 28 %
Lymphs Abs: 1.5 10*3/uL (ref 0.7–4.0)
MCH: 30.6 pg (ref 26.0–34.0)
MCHC: 30.8 g/dL (ref 30.0–36.0)
MCV: 99.3 fL (ref 80.0–100.0)
Monocytes Absolute: 0.4 10*3/uL (ref 0.1–1.0)
Monocytes Relative: 7 %
Neutro Abs: 3.4 10*3/uL (ref 1.7–7.7)
Neutrophils Relative %: 62 %
Platelets: 181 10*3/uL (ref 150–400)
RBC: 4.18 MIL/uL (ref 3.87–5.11)
RDW: 14 % (ref 11.5–15.5)
WBC: 5.3 10*3/uL (ref 4.0–10.5)
nRBC: 0 % (ref 0.0–0.2)

## 2022-04-14 ENCOUNTER — Non-Acute Institutional Stay (SKILLED_NURSING_FACILITY): Payer: Medicare HMO | Admitting: Adult Health

## 2022-04-14 ENCOUNTER — Encounter: Payer: Self-pay | Admitting: Adult Health

## 2022-04-14 ENCOUNTER — Other Ambulatory Visit: Payer: Self-pay | Admitting: Adult Health

## 2022-04-14 DIAGNOSIS — D500 Iron deficiency anemia secondary to blood loss (chronic): Secondary | ICD-10-CM

## 2022-04-14 DIAGNOSIS — D649 Anemia, unspecified: Secondary | ICD-10-CM

## 2022-04-14 DIAGNOSIS — E785 Hyperlipidemia, unspecified: Secondary | ICD-10-CM

## 2022-04-14 DIAGNOSIS — K222 Esophageal obstruction: Secondary | ICD-10-CM

## 2022-04-14 DIAGNOSIS — I25110 Atherosclerotic heart disease of native coronary artery with unstable angina pectoris: Secondary | ICD-10-CM

## 2022-04-14 DIAGNOSIS — I700 Atherosclerosis of aorta: Secondary | ICD-10-CM

## 2022-04-14 DIAGNOSIS — F339 Major depressive disorder, recurrent, unspecified: Secondary | ICD-10-CM

## 2022-04-14 DIAGNOSIS — I5032 Chronic diastolic (congestive) heart failure: Secondary | ICD-10-CM

## 2022-04-14 DIAGNOSIS — K219 Gastro-esophageal reflux disease without esophagitis: Secondary | ICD-10-CM | POA: Diagnosis not present

## 2022-04-14 DIAGNOSIS — K449 Diaphragmatic hernia without obstruction or gangrene: Secondary | ICD-10-CM | POA: Diagnosis not present

## 2022-04-14 DIAGNOSIS — J690 Pneumonitis due to inhalation of food and vomit: Secondary | ICD-10-CM

## 2022-04-14 DIAGNOSIS — G894 Chronic pain syndrome: Secondary | ICD-10-CM

## 2022-04-14 DIAGNOSIS — G2581 Restless legs syndrome: Secondary | ICD-10-CM

## 2022-04-14 DIAGNOSIS — K582 Mixed irritable bowel syndrome: Secondary | ICD-10-CM

## 2022-04-14 DIAGNOSIS — I4821 Permanent atrial fibrillation: Secondary | ICD-10-CM

## 2022-04-14 DIAGNOSIS — F5101 Primary insomnia: Secondary | ICD-10-CM

## 2022-04-14 MED ORDER — FENTANYL 50 MCG/HR TD PT72
1.0000 | MEDICATED_PATCH | TRANSDERMAL | 0 refills | Status: DC
Start: 2022-04-14 — End: 2022-05-11

## 2022-04-14 MED ORDER — HYDROCODONE-ACETAMINOPHEN 10-325 MG PO TABS: 1.0000 | ORAL_TABLET | ORAL | 0 refills | Status: DC | PRN

## 2022-04-14 MED ORDER — DIPHENOXYLATE-ATROPINE 2.5-0.025 MG PO TABS: 1.0000 | ORAL_TABLET | Freq: Four times a day (QID) | ORAL | 0 refills | Status: DC | PRN

## 2022-04-14 NOTE — Progress Notes (Signed)
?Location:  [place] ?Nursing Home Room Number: 159-P ?Place of Service:  SNF (31) ? ? ?CODE STATUS: Full Code  ? ?Allergies  ?Allergen Reactions  ? Buprenorphine Hcl Itching  ? Cymbalta [Duloxetine Hcl] Other (See Comments)  ?  Other reaction(s): Other (See Comments) ?Personality changes - crying  2014  ? Morphine And Related Itching  ? Oxycodone-Acetaminophen Other (See Comments)  ?  Personality changes   ? Valium [Diazepam] Other (See Comments)  ?  Personality changes - "in another world" ;  Hallucinations  2015  ? Statins Other (See Comments)  ?  Other reaction(s): Other (See Comments) ?Muscle weakness ?Muscle weakness  ? Morphine Itching  ? Altace [Ramipril] Other (See Comments)  ?  unknown  ? Floxin [Ofloxacin] Other (See Comments)  ?  unknown  ? Hydromorphone Other (See Comments)  ?  Other reaction(s): Confusion (intolerance) ?unknown  ? Lovaza [Omega-3-Acid Ethyl Esters] Other (See Comments)  ?  Muscle weakness   ? Trilipix [Choline Fenofibrate] Other (See Comments)  ?  Muscle weakness   ? Zetia [Ezetimibe] Other (See Comments)  ?  Muscle weakness   ? ? ?Chief Complaint  ?Patient presents with  ? Hospitalization Follow-up  ?  Follow-up from recent hospital stay   ? ? ?HPI: ? ?She is a 81 year old woman who has been hospitalized from 03-17-22 through 04-13-22. Her medical history includes: CAD; afib; CHF; dementia; diabetes. She had a paraesophageal hernia repair on 02-17-22. She presented as a direct admit from [place] with concerns of esophageal hematoma. She was unable to tolerate po upon admission.  A barium swallow was done which confirmed poor swallowing physiology and possible obstruction. On 03-20-22: she was due to have an EGD; however she had a aspiration event with respiratory failure and uncontrolled afib. She was then intubated. On 03-22-22 she was extubated she had a nasal feeding tube placed. Her echo demonstrated EF 40-45%.  ?On 03-27-22: she underwent an EGD: severe narrowing at the  GE junction which was able to be dilated to 12 mm. Speech performed PFS which demonstrated severely impacted pharyngoesophageal deficits impacting swallowing safety and recommended NPO.  ?She had a peg tube placed on 04-07-22. She is unable to go home at this time. She is here for rehab; and possibly transitioned to hospice care.  ?She is having bilateral lower extremity pain; with restless legs. She is not able to be comfortable at this time. She states that she wants to die due to her pain.  ?She will continue to be followed for her chronic illnesses including: Chronic pain associated with significant psychosocial dysfunction: Iron deficiency anemia associated with chronic blood loss: Hyperlipidemia LDL goal <100;  Major depression recurrent chronic:  Aortic atherosclerosis ? ? ?Past Medical History:  ?Diagnosis Date  ? Abnormal nuclear cardiac imaging test   ? Anemia   ? Anemia, iron deficiency 07/02/2015  ? Anxiety   ? Arthritis   ? "knees, back, fingers, toes; joints" (01/07/2015)  ? 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 CAD (coronary artery disease)   ? a. Abnl nuc 11/2014. Cath 12/2014 - turned down for CABG. Ultimately s/p TTVP, rotational atherectomy, PTCA and stenting of the ostial LCx and left main into the LAD (crush technique), and IVUS of the LAD/Left main. // b. Myoview 11/17: EF 48, poor quality/significant artifact; inf-lateral, inferior ischemia; Intermediate Risk  ? Chronic atrial fibrillation (HCC)   ? a.  First noted post-op 9/15 spinal fusion.  She had cardioversion, not on anticoagulation.  Fall risk, unsteady. // failed DCCV // Holter 10/17: AFib, Avg HR 97, PVCs, no other arrhythmia  ? Chronic back pain greater than 3 months duration   ? a. spinal stenosis.  Spinal fusion with rods in 2/15 at [facility] spinal fusion 9/15.  ? Chronic diastolic CHF   ? Echo 2/17:  EF 50-55%, trivial AI, midl MR, mod LAE, PASP 37 mmHg // Echocardiogram 03/2020: EF 45-50, no RWMA, mild LVH, mild reduced RVSF,  mildly elevated PASP (RVSP 38.3), mild LAE, mild MR, mild to mod TR, trivial AI // Echo 9/21: EF 45, mild LVH, mildly reduced RV SF, moderate LAE, mild RAE, mild MR, mild AI     ? Circadian rhythm sleep disorder   ? CTS (carpal tunnel syndrome)   ? Deficiency anemia 11/10/2014  ? Depression   ? Diarrhea   ? Diverticulitis of colon   ? Esophageal stricture   ? Gastritis   ? Gastroesophageal hernia 07/06/2013  ? GERD (gastroesophageal reflux disease)   ? Hiatal hernia   ? History of blood transfusion   ? "most of them related to OR's"   ? HTN (hypertension)   ? Hyperlipidemia   ? IBS (irritable bowel syndrome)   ? Incontinence 10/13/2012  ? Insomnia   ? Ischemic chest pain ([place])   ? Memory disorder 12/04/2014  ? Metabolic syndrome 40/09/2724  ? MGUS (monoclonal gammopathy of unknown significance) dx'd 11/2014  ? a. Neg BMB 11/2014.  ? Multiple falls   ? Obesity   ? Obstructive sleep apnea   ? "have mask; don't wear it" (01/07/2015)  ? Orthostasis   ? PAT (paroxysmal atrial tachycardia) ([place])   ? Personal history of colonic polyps 10/25/2011 & 12/02/11  ? not retrieved Dr Lyla Son & tubular adenomas  ? Pneumonia 03/2014  ? Sinus bradycardia   ? a. Baseline HR 50s-60s.  ? Stroke [facility]) early 2000's  ? "small"; denies residual on 01/07/2015)  ? ? ?Past Surgical History:  ?Procedure Laterality Date  ? BACK SURGERY    ? BONE MARROW BIOPSY  11/2014  ? CARDIAC CATHETERIZATION  12/27/2014  ? Procedure: INTRAVASCULAR PRESSURE WIRE/FFR STUDY;  Surgeon: Larey Dresser, MD;  Location: [facility] CATH LAB;  Service: Cardiovascular;;  ? CARDIAC CATHETERIZATION N/A 11/25/2016  ? Procedure: Left Heart Cath and Coronary Angiography;  Surgeon: Sherren Mocha, MD;  Location: [place] CV LAB;  Service: Cardiovascular;  Laterality: N/A;  ? CARDIOVERSION N/A 02/13/2016  ? Procedure: CARDIOVERSION;  Surgeon: Josue Hector, MD;  Location: [facility] ENDOSCOPY;  Service: Cardiovascular;  Laterality: N/A;  ? CARPAL TUNNEL RELEASE Right 1980's  ? CATARACT  EXTRACTION Bilateral   ? CATARACT EXTRACTION W/ INTRAOCULAR LENS  IMPLANT, BILATERAL  2000's  ? CORONARY ANGIOPLASTY WITH STENT PLACEMENT  01/07/2015  ? "2"  ? CORONARY STENT PLACEMENT    ? DILATION AND CURETTAGE OF UTERUS    ? ESOPHAGOGASTRODUODENOSCOPY (EGD) WITH ESOPHAGEAL DILATION  "several times"  ? JOINT REPLACEMENT    ? KNEE ARTHROSCOPY Left 1995  ? LAPAROSCOPIC CHOLECYSTECTOMY  2003  ? LEFT HEART CATHETERIZATION WITH CORONARY ANGIOGRAM N/A 12/27/2014  ? Procedure: LEFT HEART CATHETERIZATION WITH CORONARY ANGIOGRAM;  Surgeon: Larey Dresser, MD;  Location: [facility] CATH LAB;  Service: Cardiovascular;  Laterality: N/A;  ? PERCUTANEOUS CORONARY ROTOBLATOR INTERVENTION (PCI-R)  01/07/2015  ? PERCUTANEOUS CORONARY ROTOBLATOR INTERVENTION (PCI-R) N/A 01/07/2015  ? Procedure: PERCUTANEOUS CORONARY ROTOBLATOR INTERVENTION (PCI-R);  Surgeon: Blane Ohara, MD;  Location: [facility] CATH LAB;  Service: Cardiovascular;  Laterality:  N/A;  ? POSTERIOR FUSION THORACIC SPINE  08/2015  ? POSTERIOR LUMBAR FUSION  01/2015  ? TOTAL KNEE ARTHROPLASTY Bilateral 1990's - 2000's  ? ? ?Social History  ? ?Socioeconomic History  ? Marital status: Widowed  ?  Spouse name: Not on file  ? Number of children: 2  ? Years of education: HS  ? Highest education level: Not on file  ?Occupational History  ? Occupation: Licensed conveyancer- retired  ?  Comment: retired  ?Tobacco Use  ? Smoking status: Never  ? Smokeless tobacco: Never  ?Vaping Use  ? Vaping Use: Never used  ?Substance and Sexual Activity  ? Alcohol use: No  ?  Comment: 01/07/2015 "glass of wine at Christmas, maybe"  ? Drug use: No  ? Sexual activity: Not Currently  ?Other Topics Concern  ? Not on file  ?Social History Narrative  ? Patient is right handed  ? Patient drinks 1-2 sodas daily.  ? patlient lives alone.  ? ?Social Determinants of Health  ? ?Financial Resource Strain: Not on file  ?Food Insecurity: Not on file  ?Transportation Needs: Not on file  ?Physical Activity: Not on file  ?Stress: Not on  file  ?Social Connections: Not on file  ?Intimate Partner Violence: Not on file  ? ?Family History  ?Problem Relation Age of Onset  ? Coronary artery disease Father   ? Peripheral vascular disease Father   ?

## 2022-04-15 DIAGNOSIS — I700 Atherosclerosis of aorta: Secondary | ICD-10-CM | POA: Insufficient documentation

## 2022-04-15 DIAGNOSIS — G2581 Restless legs syndrome: Secondary | ICD-10-CM | POA: Insufficient documentation

## 2022-04-16 ENCOUNTER — Non-Acute Institutional Stay (SKILLED_NURSING_FACILITY): Payer: Medicare HMO | Admitting: Internal Medicine

## 2022-04-16 ENCOUNTER — Encounter: Payer: Self-pay | Admitting: Internal Medicine

## 2022-04-16 DIAGNOSIS — K222 Esophageal obstruction: Secondary | ICD-10-CM | POA: Diagnosis not present

## 2022-04-16 DIAGNOSIS — I4821 Permanent atrial fibrillation: Secondary | ICD-10-CM

## 2022-04-16 DIAGNOSIS — G2581 Restless legs syndrome: Secondary | ICD-10-CM | POA: Diagnosis not present

## 2022-04-16 DIAGNOSIS — Z9189 Other specified personal risk factors, not elsewhere classified: Secondary | ICD-10-CM

## 2022-04-16 NOTE — Assessment & Plan Note (Signed)
Rate control is adequate on present regimen; no change indicated. ?

## 2022-04-16 NOTE — Assessment & Plan Note (Addendum)
Risk discussed with daughter who is concerned that present fentanyl dose provides inadequate pain relief. ?Narcan required as inpatient. ?

## 2022-04-16 NOTE — Assessment & Plan Note (Addendum)
Continues NPO with all nutrition and medication administration via G-tube. ?Speech Therapy to follow. ?

## 2022-04-16 NOTE — Assessment & Plan Note (Signed)
Requip initiated ?

## 2022-04-16 NOTE — Patient Instructions (Signed)
See assessment and plan under each diagnosis in the problem list and acutely for this visit 

## 2022-04-16 NOTE — Progress Notes (Signed)
? ?NURSING HOME LOCATION: [place] ?ROOM NUMBER:  159 P ? ?CODE STATUS:  Full Code ? ?PCP:  Adam Phenix DO ? ?This is a nursing facility follow up visit for [place] readmission within 30 days ? ?Interim medical record and care since last SNF visit was updated with review of diagnostic studies and change in clinical status since last visit were documented. ? ?HPI: Patient was rehospitalized 3/28 - 04/13/2022 at [facility] due to inability to eat over a few days PTA with nausea and emesis in the context of paraesophageal hernia repair and Heller myotomy on 02/17/2022. ?CT of the abdomen/pelvis revealed 120 cc rounded complex collection at the site of the prior hiatal hernia surgery.  The apparent hematoma was in the context of Eliquis prophylaxis for A-fib. ?She was unable to tolerate p.o. intake upon admission or ingest oral medications.  She was transitioned to heparin drip from Eliquis. A-fib with  RVR required IV metoprolol. ?Barium swallow revealed poor swallowing physiology with possible obstruction. ?EGD planned 3/31 was deferred as she had an acute aspiration event with respiratory failure associated with uncontrolled A-fib with RVR.  Intubation and placement in the SICU was necessary.  Course in the ICU was complicated by difficulties with rate control of A-fib and hypotension.  Echo revealed worsening EF of 40-45% necessitating discontinuation of diltiazem with transition to esmolol drip.  This was transitioned to metoprolol and digoxin because of hypotension.  This was discontinued on 4/22 because of bradycardia. ?She was able to be extubated on 4/2.  Gastric DHT was placed and she began receiving tube feeds.  On 4/7 she was moved to a floor bed and underwent EGD which demonstrated severe narrowing at GE junction for which dilation to 12 mm was completed.   ?Diarrhea ensued; stool panel and C. difficile were negative.  Tube feedings were adjusted  to increase fiber and antidiarrheal agents employed as needed.  Speech-language pathology conducted PFS as she could not tolerate FEES. This demonstrated severe deficits impacting swallowing safety.  NPO. status was recommended with alternate means of nutrition.  Swallow therapy was attempted but she had poor tolerance and prognosis for improvement was deemed poor given the severity of the symptoms. ?Palliative care consulted regarding G-tube placement with removal of DHT.  Hospice placement was considered as it was not anticipated that her care could be provided in the SNF in the context of DHT long-term.  G-tube placement was completed 4/18. ?Prior to G-tube placement patient had episodes of altered mental status most likely due to polypharmacy.  This improved with Narcan administrations and adjustment of medication regimen. ?Family wished to pursue SNF placement with Hospice involvement if condition deteriorated. ?At discharge she remained NPO due to dysphagia without associated nausea or emesis. ? ?Her past medical history includes diagnoses of iron deficiency anemia, CAD, CHF, chronic diastolic congestive heart failure, anxiety/depression, history of diverticulitis, OSA, and history of stroke. ?Procedures and surgeries include cardioversion and coronary artery stenting. ? ?Review of systems: She has a chronic pain syndrome and has been on fentanyl patches since 2015.  Her daughter states that she may have developed a tolerance and is receiving inadequate pain relief with present regimen.  She has also had pain in the left buttocks & thigh region apparently related to a torn muscle.  Additionally she has a "broken rod" in her back , but is not a surgical candidate to have it repaired. ?The patient's major complaint is anxiety.  Initially she  could not tell me why she been hospitalized but finally described a "hiatal hernia? needed fix".  She then went on on to state that "nothing was done". ?With her anxiety she  does have palpitations. ?She also has symptoms of restless leg syndrome with involuntary movements of the legs.  She also describes numbness and tingling in her feet. ? ?Constitutional: No fever, significant weight change  ?Eyes: No redness, discharge, pain, vision change ?ENT/mouth: No nasal congestion,  purulent discharge, earache, change in hearing, sore throat  ?Cardiovascular: No chest pain, paroxysmal nocturnal dyspnea, claudication, edema  ?Respiratory: No cough, sputum production, hemoptysis, DOE   ?Gastrointestinal: No heartburn, abdominal pain, active nausea /vomiting, rectal bleeding, melena, change in bowels ?Genitourinary: No dysuria, hematuria, pyuria, incontinence, nocturia ?Dermatologic: No rash, pruritus, change in appearance of skin ?Neurologic: No dizziness, headache, syncope, seizures ?Psychiatric: No significant insomnia ?Endocrine: No change in hair/skin/nails, excessive thirst, excessive hunger, excessive urination  ?Hematologic/lymphatic: No significant bruising, lymphadenopathy, abnormal bleeding ?Allergy/immunology: No itchy/watery eyes, significant sneezing, urticaria, angioedema ? ?Physical exam:  ?Pertinent or positive findings: Clinically she is very anxious with tremulousness and exhibits a wide-eyed stare.  Oral hygiene is very poor with multiple missing maxillary teeth and only 2 lower teeth remaining.  Heart rhythm is irregular.  S1 and S2 are accentuated.  Breath sounds are decreased at the bases.  Feeding tube is present in the mid abdomen.  Posterior tibial pulses are decreased but dorsalis pedis pulses are palpable.  The right heel is dressed.  Fusiform changes the knees present with well-healed operative scars.  Great toenails are deformed.  She has partial flexion contractions mainly of the right toes. ? ?General appearance: no acute distress, increased work of breathing is present.   ?Lymphatic: No lymphadenopathy about the head, neck, axilla. ?Eyes: No conjunctival  inflammation or lid edema is present. There is no scleral icterus. ?Ears:  External ear exam shows no significant lesions or deformities.   ?Nose:  External nasal examination shows no deformity or inflammation. Nasal mucosa are pink and moist without lesions, exudates ?Neck:  No thyromegaly, masses, tenderness noted.    ?Heart:  No gallop, murmur, click, rub .  ?Lungs:  without wheezes, rhonchi, rales, rubs. ?Abdomen: Bowel sounds are normal. Abdomen is soft and nontender with no organomegaly, hernias, masses. ?GU: Deferred  ?Extremities:  No cyanosis, clubbing, edema  ?Neurologic exam :Balance, Rhomberg, finger to nose testing could not be completed due to clinical state ?Skin: Warm & dry w/o tenting. ?No significant lesions or rash. ? ?See summary under each active problem in the Problem List with associated updated therapeutic plan ? ? ?

## 2022-04-17 ENCOUNTER — Other Ambulatory Visit: Payer: Self-pay | Admitting: Adult Health

## 2022-04-17 ENCOUNTER — Encounter (HOSPITAL_COMMUNITY): Payer: Self-pay

## 2022-04-17 ENCOUNTER — Emergency Department (HOSPITAL_COMMUNITY)
Admission: EM | Admit: 2022-04-17 | Discharge: 2022-04-17 | Disposition: A | Discharge status: Home / Self Care | Payer: Medicare HMO | Attending: Emergency Medicine | Admitting: Emergency Medicine

## 2022-04-17 ENCOUNTER — Other Ambulatory Visit: Payer: Self-pay

## 2022-04-17 DIAGNOSIS — I110 Hypertensive heart disease with heart failure: Secondary | ICD-10-CM | POA: Insufficient documentation

## 2022-04-17 DIAGNOSIS — I2510 Atherosclerotic heart disease of native coronary artery without angina pectoris: Secondary | ICD-10-CM | POA: Diagnosis not present

## 2022-04-17 DIAGNOSIS — K9423 Gastrostomy malfunction: Secondary | ICD-10-CM

## 2022-04-17 DIAGNOSIS — Z79899 Other long term (current) drug therapy: Secondary | ICD-10-CM | POA: Diagnosis not present

## 2022-04-17 DIAGNOSIS — T85598A Other mechanical complication of other gastrointestinal prosthetic devices, implants and grafts, initial encounter: Secondary | ICD-10-CM | POA: Insufficient documentation

## 2022-04-17 DIAGNOSIS — Z7901 Long term (current) use of anticoagulants: Secondary | ICD-10-CM | POA: Insufficient documentation

## 2022-04-17 DIAGNOSIS — Y828 Other medical devices associated with adverse incidents: Secondary | ICD-10-CM | POA: Diagnosis not present

## 2022-04-17 DIAGNOSIS — I509 Heart failure, unspecified: Secondary | ICD-10-CM | POA: Insufficient documentation

## 2022-04-17 DIAGNOSIS — Z7982 Long term (current) use of aspirin: Secondary | ICD-10-CM | POA: Insufficient documentation

## 2022-04-17 MED ORDER — FENTANYL CITRATE PF 50 MCG/ML IJ SOSY
50.0000 ug | PREFILLED_SYRINGE | Freq: Once | INTRAMUSCULAR | Status: AC
  Administered 2022-04-17: 50 ug via INTRAMUSCULAR
  Filled 2022-04-17: qty 1

## 2022-04-17 MED ORDER — ESZOPICLONE 3 MG PO TABS: 3.0000 mg | ORAL_TABLET | Freq: Every day | ORAL | 0 refills | Status: DC

## 2022-04-17 NOTE — ED Triage Notes (Signed)
Patient arrived from [facility]. Per nursing home staff G tube is clogged and they are requesting a larger tube be placed, because they are unable to push medications through it.  ?

## 2022-04-17 NOTE — ED Notes (Signed)
G tube flushed and EDP verified.  ?

## 2022-04-17 NOTE — Discharge Instructions (Signed)
[name] [name] is not going to put in a different Mickey tube.  You can order a larger diameter extension tubing if needed.   ?

## 2022-04-17 NOTE — ED Provider Notes (Signed)
?[place] ?Provider Note ? ? ?CSN: [id number] ?Arrival date & time: 04/17/22  1231 ? ?  ? ?History ? ?Chief Complaint  ?Patient presents with  ? G tube clogged  ? ? ?[name] [name] is a 81 y.o. female. ? ?Pt has a pmhx significant for depression, ibs, esophageal stricture, gerd, htn, hyperlipidemia, depression, chf, OSA, MGUS, cva, arthritis, anxiety, CAD, afib on Eliquis and chronic pain. ?She presents to the ED today with her g-tube clogged.  Pt had a mickey button g-tube placed on 4/18 at [facility].  The pt had a prolonged admission there from 3/28-4/24 s/p paraesophageal hernia repair and heller myotomy.  The pt had multiple complications during her stay.  She was still unable to swallow, so the g-tube was placed.  She was sent to the SNF at d/c.  The pt was sent here today because the g-tube was clogged.  It's been clogged since yesterday.  Pt complains of pain everywhere.  No fevers. ? ? ?  ? ?Home Medications ?Prior to Admission medications   ?Medication Sig Start Date End Date Taking? Authorizing Provider  ?albuterol (VENTOLIN HFA) 108 (90 Base) MCG/ACT inhaler Inhale 2 puffs into the lungs every 6 (six) hours as needed for wheezing or shortness of breath.   Yes [provider]  ?aspirin EC 81 MG tablet Take 81 mg by mouth daily. Swallow whole.   Yes [provider]  ?Janne Lab Oil (Arsenio Loader) OINT Apply 1 application. topically daily.   Yes [provider]  ?Cholecalciferol 25 MCG (1000 UT) tablet Place 1,000 Units into feeding tube daily. 9 am   Yes [provider]  ?clobetasol (TEMOVATE) 0.05 % external solution Apply 1 application. topically 2 (two) times a week. Monday and Friday   Yes [provider]  ?diltiazem (DILACOR XR) 240 MG 24 hr capsule Take 240 mg by mouth in the morning and at bedtime.   Yes [provider]  ?diphenoxylate-atropine (LOMOTIL) 2.5-0.025 MG/5ML liquid Place 5 mLs into feeding tube 3 (three) times  daily as needed for diarrhea or loose stools.   Yes [provider]  ?ELIQUIS 5 MG TABS tablet Take 1 tablet (5 mg total) by mouth 2 (two) times daily. 11/26/21  Yes Ronnie Doss M, DO  ?eszopiclone 3 MG TABS Take 1 tablet (3 mg total) by mouth at bedtime. Take immediately before bedtime 04/17/22  Yes Gerlene Fee, NP  ?fentaNYL (DURAGESIC) 50 MCG/HR Place 1 patch onto the skin every 3 (three) days. 04/14/22  Yes Gerlene Fee, NP  ?FLUoxetine (PROZAC) 20 MG/5ML solution Place 40 mg into feeding tube daily.   Yes [provider]  ?furosemide (LASIX) 40 MG tablet Place 40 mg into feeding tube daily. 9 am   Yes [provider]  ?HYDROcodone-acetaminophen (NORCO) 10-325 MG tablet Take 1 tablet by mouth every 4 (four) hours as needed for moderate pain. 04/14/22  Yes Gerlene Fee, NP  ?hydrOXYzine (ATARAX) 25 MG tablet Place 25 mg into feeding tube 3 (three) times daily as needed for anxiety or itching.   Yes [provider]  ?LINZESS 145 MCG CAPS capsule Place 145 mcg into feeding tube daily as needed (constipation). 07/13/21  Yes [provider]  ?loperamide HCl (IMODIUM) 1 MG/7.5ML suspension Place 2 mg into feeding tube 4 (four) times daily as needed for diarrhea or loose stools.   Yes [provider]  ?meclizine (ANTIVERT) 25 MG tablet Place 25 mg into feeding tube 3 (three) times daily  as needed for dizziness.   Yes [provider]  ?methocarbamol (ROBAXIN) 500 MG tablet Take 1,000 mg by mouth 3 (three) times daily. 9 am, 2 pm, and 9 pm   Yes [provider]  ?Omeprazole-Sodium Bicarbonate (KONVOMEP) 2-84 MG/ML SUSR Give 10 mLs by tube in the morning and at bedtime.   Yes [provider]  ?rOPINIRole (REQUIP) 0.5 MG tablet Place 0.5 mg into feeding tube in the morning and at bedtime.   Yes [provider]  ?rosuvastatin (CRESTOR) 10 MG tablet Place 10 mg into feeding tube daily. 9 am   Yes [provider]   ?naloxone (NARCAN) nasal spray 4 mg/0.1 mL Special Instructions: As needed for depressed respirations less than 8/minute. ?Notify provider if given. If no effectiveness or improvement, contact 911. ?As Needed    [provider]  ?nitroGLYCERIN (NITROSTAT) 0.4 MG SL tablet Place 1 tablet (0.4 mg total) under the tongue every 5 (five) minutes as needed for chest pain (up to 3 doses). 11/17/21   Imogene Burn, PA-C  ?Nutritional Supplements (FEEDING SUPPLEMENT, JEVITY 1.2 CAL,) LIQD Place 1,000 mLs into feeding tube as directed. Every Shift; Day, Evening, Nigh    [provider]  ?SODIUM CHLORIDE IV Inject into the vein. amt: 100 cc per hour; intravenous ?Special Instructions: FOR 2 LITERS ONLY for acute renal failure    [provider]  ?   ? ?Allergies    ?Buprenorphine hcl, Cymbalta [duloxetine hcl], Morphine and related, Oxycodone-acetaminophen, Valium [diazepam], Statins, Morphine, Altace [ramipril], Floxin [ofloxacin], Hydromorphone, Lovaza [omega-3-acid ethyl esters], Trilipix [choline fenofibrate], and Zetia [ezetimibe]   ? ?Review of Systems   ?Review of Systems  ?Musculoskeletal:   ?     Diffuse pain  ?All other systems reviewed and are negative. ? ?Physical Exam ?Updated Vital Signs ?BP (!) 132/56   Pulse 82   Temp 98.5 ?F (36.9 ?C) (Oral)   Resp 18   Ht '5\' 6"'$  (1.676 m)   Wt 73 kg   SpO2 99%   BMI 25.98 kg/m?  ?Physical Exam ?Vitals and nursing note reviewed.  ?Constitutional:   ?   Appearance: Normal appearance.  ?HENT:  ?   Head: Normocephalic and atraumatic.  ?   Right Ear: External ear normal.  ?   Left Ear: External ear normal.  ?   Nose: Nose normal.  ?   Mouth/Throat:  ?   Mouth: Mucous membranes are moist.  ?   Pharynx: Oropharynx is clear.  ?Eyes:  ?   Extraocular Movements: Extraocular movements intact.  ?   Conjunctiva/sclera: Conjunctivae normal.  ?   Pupils: Pupils are equal, round, and reactive to light.  ?Cardiovascular:  ?   Rate and Rhythm: Normal rate  and regular rhythm.  ?   Pulses: Normal pulses.  ?   Heart sounds: Normal heart sounds.  ?Pulmonary:  ?   Effort: Pulmonary effort is normal.  ?   Breath sounds: Normal breath sounds.  ?Abdominal:  ?   General: Abdomen is flat. Bowel sounds are normal.  ?   Palpations: Abdomen is soft.  ?   Comments: Mickey button + extension tubing  ?Musculoskeletal:     ?   General: Normal range of motion.  ?   Cervical back: Normal range of motion and neck supple.  ?Skin: ?   General: Skin is warm.  ?   Capillary Refill: Capillary refill takes less than 2 seconds.  ?Neurological:  ?   General: No focal deficit  present.  ?   Mental Status: She is alert and oriented to person, place, and time.  ?Psychiatric:     ?   Mood and Affect: Mood normal.     ?   Behavior: Behavior normal.  ? ? ?ED Results / Procedures / Treatments   ?Labs ?(all labs ordered are listed, but only abnormal results are displayed) ?Labs Reviewed - No data to display ? ?EKG ?None ? ?Radiology ?No results found. ? ?Procedures ?Procedures  ? ? ?Medications Ordered in ED ?Medications  ?fentaNYL (SUBLIMAZE) injection 50 mcg (50 mcg Intramuscular Given 04/17/22 1336)  ? ? ?ED Course/ Medical Decision Making/ A&P ?  ?                        ?Medical Decision Making ?Risk ?Prescription drug management. ? ? ?This patient presents to the ED for concern of g tube dysfunction, this involves an extensive number of treatment options, and is a complaint that carries with it a high risk of complications and morbidity.  The differential diagnosis includes clogged/dislodge ? ? ?Co morbidities that complicate the patient evaluation ? ?depression, ibs, esophageal stricture, gerd, htn, hyperlipidemia, depression, chf, OSA, MGUS, cva, arthritis, anxiety, CAD, afib on Eliquis and chronic pain ? ? ?Additional history obtained: ? ?Additional history obtained from epic chart review ? ? ?Cardiac Monitoring: ? ?The patient was maintained on a cardiac monitor.  I personally viewed and  interpreted the cardiac monitored which showed an underlying rhythm of: nsr ? ? ?Medicines ordered and prescription drug management: ? ?I ordered medication including fentanyl  for pain  ?Reevaluation of the patient

## 2022-04-17 NOTE — ED Notes (Signed)
AC called at this time for G tube extender per Dr. Gilford Raid. ?

## 2022-04-22 ENCOUNTER — Encounter: Payer: Self-pay | Admitting: Adult Health

## 2022-04-22 ENCOUNTER — Other Ambulatory Visit: Payer: Self-pay | Admitting: Adult Health

## 2022-04-22 ENCOUNTER — Non-Acute Institutional Stay (SKILLED_NURSING_FACILITY): Payer: Medicare HMO | Admitting: Adult Health

## 2022-04-22 DIAGNOSIS — G894 Chronic pain syndrome: Secondary | ICD-10-CM | POA: Diagnosis not present

## 2022-04-22 DIAGNOSIS — M159 Polyosteoarthritis, unspecified: Secondary | ICD-10-CM

## 2022-04-22 NOTE — Progress Notes (Signed)
?Location:  [place] ?Nursing Home Room Number: 159-P ?Place of Service:  SNF (31) ? ? ?CODE STATUS: DNR ? ?Allergies  ?Allergen Reactions  ? Buprenorphine Hcl Itching  ? Cymbalta [Duloxetine Hcl] Other (See Comments)  ?  Other reaction(s): Other (See Comments) ?Personality changes - crying  2014  ? Morphine And Related Itching  ? Oxycodone-Acetaminophen Other (See Comments)  ?  Personality changes   ? Valium [Diazepam] Other (See Comments)  ?  Personality changes - "in another world" ;  Hallucinations  2015  ? Statins Other (See Comments)  ?  Other reaction(s): Other (See Comments) ?Muscle weakness ?Muscle weakness  ? Morphine Itching  ? Altace [Ramipril] Other (See Comments)  ?  unknown  ? Floxin [Ofloxacin] Other (See Comments)  ?  unknown  ? Hydromorphone Other (See Comments)  ?  Other reaction(s): Confusion (intolerance) ?unknown  ? Lovaza [Omega-3-Acid Ethyl Esters] Other (See Comments)  ?  Muscle weakness   ? Trilipix [Choline Fenofibrate] Other (See Comments)  ?  Muscle weakness   ? Zetia [Ezetimibe] Other (See Comments)  ?  Muscle weakness   ? ? ?Chief Complaint  ?Patient presents with  ? Acute Visit  ?  Pain management   ? ? ?HPI: ? ?She is having increased pain in her neck and shoulders. She is presently taking fentanyl 50 mcg patch; vicodin 10/325 mg every 4 hours as needed. She is upset and is restless due to her pain. She is able to participate in therapy and was ambulating with therapy today.   ? ?Past Medical History:  ?Diagnosis Date  ? Abnormal nuclear cardiac imaging test   ? Anemia   ? Anemia, iron deficiency 07/02/2015  ? Anxiety   ? Arthritis   ? "knees, back, fingers, toes; joints" (01/07/2015)  ? 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 CAD (coronary artery disease)   ? a. Abnl nuc 11/2014. Cath 12/2014 - turned down for CABG. Ultimately s/p TTVP, rotational atherectomy, PTCA and stenting of the ostial LCx and left main into the LAD (crush technique), and IVUS of the LAD/Left main. // b.  Myoview 11/17: EF 48, poor quality/significant artifact; inf-lateral, inferior ischemia; Intermediate Risk  ? Chronic atrial fibrillation (HCC)   ? a.  First noted post-op 9/15 spinal fusion.  She had cardioversion, not on anticoagulation. Fall risk, unsteady. // failed DCCV // Holter 10/17: AFib, Avg HR 97, PVCs, no other arrhythmia  ? Chronic back pain greater than 3 months duration   ? a. spinal stenosis.  Spinal fusion with rods in 2/15 at [facility] spinal fusion 9/15.  ? Chronic diastolic CHF   ? Echo 2/17:  EF 50-55%, trivial AI, midl MR, mod LAE, PASP 37 mmHg // Echocardiogram 03/2020: EF 45-50, no RWMA, mild LVH, mild reduced RVSF, mildly elevated PASP (RVSP 38.3), mild LAE, mild MR, mild to mod TR, trivial AI // Echo 9/21: EF 45, mild LVH, mildly reduced RV SF, moderate LAE, mild RAE, mild MR, mild AI     ? Circadian rhythm sleep disorder   ? CTS (carpal tunnel syndrome)   ? Deficiency anemia 11/10/2014  ? Depression   ? Diarrhea   ? Diverticulitis of colon   ? Esophageal stricture   ? Gastritis   ? Gastroesophageal hernia 07/06/2013  ? GERD (gastroesophageal reflux disease)   ? Hiatal hernia   ? History of blood transfusion   ? "most of them related to OR's"   ? HTN (hypertension)   ? Hyperlipidemia   ?  IBS (irritable bowel syndrome)   ? Incontinence 10/13/2012  ? Insomnia   ? Ischemic chest pain ([place])   ? Memory disorder 12/04/2014  ? Metabolic syndrome 43/15/4008  ? MGUS (monoclonal gammopathy of unknown significance) dx'd 11/2014  ? a. Neg BMB 11/2014.  ? Multiple falls   ? Obesity   ? Obstructive sleep apnea   ? "have mask; don't wear it" (01/07/2015)  ? Orthostasis   ? PAT (paroxysmal atrial tachycardia) ([place])   ? Personal history of colonic polyps 10/25/2011 & 12/02/11  ? not retrieved Dr Lyla Son & tubular adenomas  ? Pneumonia 03/2014  ? Sinus bradycardia   ? a. Baseline HR 50s-60s.  ? Stroke [facility]) early 2000's  ? "small"; denies residual on 01/07/2015)  ? ? ?Past Surgical History:   ?Procedure Laterality Date  ? BACK SURGERY    ? BONE MARROW BIOPSY  11/2014  ? CARDIAC CATHETERIZATION  12/27/2014  ? Procedure: INTRAVASCULAR PRESSURE WIRE/FFR STUDY;  Surgeon: Larey Dresser, MD;  Location: [facility] CATH LAB;  Service: Cardiovascular;;  ? CARDIAC CATHETERIZATION N/A 11/25/2016  ? Procedure: Left Heart Cath and Coronary Angiography;  Surgeon: Sherren Mocha, MD;  Location: [facility] CV LAB;  Service: Cardiovascular;  Laterality: N/A;  ? CARDIOVERSION N/A 02/13/2016  ? Procedure: CARDIOVERSION;  Surgeon: Josue Hector, MD;  Location: [facility] ENDOSCOPY;  Service: Cardiovascular;  Laterality: N/A;  ? CARPAL TUNNEL RELEASE Right 1980's  ? CATARACT EXTRACTION Bilateral   ? CATARACT EXTRACTION W/ INTRAOCULAR LENS  IMPLANT, BILATERAL  2000's  ? CORONARY ANGIOPLASTY WITH STENT PLACEMENT  01/07/2015  ? "2"  ? CORONARY STENT PLACEMENT    ? DILATION AND CURETTAGE OF UTERUS    ? ESOPHAGOGASTRODUODENOSCOPY (EGD) WITH ESOPHAGEAL DILATION  "several times"  ? JOINT REPLACEMENT    ? KNEE ARTHROSCOPY Left 1995  ? LAPAROSCOPIC CHOLECYSTECTOMY  2003  ? LEFT HEART CATHETERIZATION WITH CORONARY ANGIOGRAM N/A 12/27/2014  ? Procedure: LEFT HEART CATHETERIZATION WITH CORONARY ANGIOGRAM;  Surgeon: Larey Dresser, MD;  Location: [facility] CATH LAB;  Service: Cardiovascular;  Laterality: N/A;  ? PERCUTANEOUS CORONARY ROTOBLATOR INTERVENTION (PCI-R)  01/07/2015  ? PERCUTANEOUS CORONARY ROTOBLATOR INTERVENTION (PCI-R) N/A 01/07/2015  ? Procedure: PERCUTANEOUS CORONARY ROTOBLATOR INTERVENTION (PCI-R);  Surgeon: Blane Ohara, MD;  Location: [facility] CATH LAB;  Service: Cardiovascular;  Laterality: N/A;  ? POSTERIOR FUSION THORACIC SPINE  08/2015  ? POSTERIOR LUMBAR FUSION  01/2015  ? TOTAL KNEE ARTHROPLASTY Bilateral 1990's - 2000's  ? ? ?Social History  ? ?Socioeconomic History  ? Marital status: Widowed  ?  Spouse name: Not on file  ? Number of children: 2  ? Years of education: HS  ? Highest education level: Not on file  ?Occupational History  ?  Occupation: Licensed conveyancer- retired  ?  Comment: retired  ?Tobacco Use  ? Smoking status: Never  ? Smokeless tobacco: Never  ?Vaping Use  ? Vaping Use: Never used  ?Substance and Sexual Activity  ? Alcohol use: No  ?  Comment: 01/07/2015 "glass of wine at Christmas, maybe"  ? Drug use: No  ? Sexual activity: Not Currently  ?Other Topics Concern  ? Not on file  ?Social History Narrative  ? Patient is right handed  ? Patient drinks 1-2 sodas daily.  ? patlient lives alone.  ? ?Social Determinants of Health  ? ?Financial Resource Strain: Not on file  ?Food Insecurity: Not on file  ?Transportation Needs: Not on file  ?Physical Activity: Not on file  ?Stress: Not on file  ?  Social Connections: Not on file  ?Intimate Partner Violence: Not on file  ? ?Family History  ?Problem Relation Age of Onset  ? Coronary artery disease Father   ? Peripheral vascular disease Father   ? Coronary artery disease Mother   ? Coronary artery disease Brother   ? Colon cancer Sister   ? Emphysema Sister   ? Sleep apnea Son   ? Colon cancer Sister   ?     spread to her brain  ? Emphysema Brother   ? Dementia Neg Hx   ? ? ? ? ?VITAL SIGNS ?BP 132/66   Pulse 82   Temp (!) 97.4 ?F (36.3 ?C)   Resp 20   Ht 5' 6" (1.676 m)   Wt 160 lb (72.6 kg)   SpO2 95%   BMI 25.82 kg/m?  ? ?Outpatient Encounter Medications as of 04/22/2022  ?Medication Sig  ? albuterol (VENTOLIN HFA) 108 (90 Base) MCG/ACT inhaler Inhale 2 puffs into the lungs every 6 (six) hours as needed for wheezing or shortness of breath.  ? aspirin EC 81 MG tablet Take 81 mg by mouth daily. Swallow whole.  ? Balsam [place]-Castor Oil (VENELEX) OINT Apply 1 application. topically daily.  ? Cholecalciferol 25 MCG (1000 UT) tablet Place 1,000 Units into feeding tube daily. 9 am  ? clobetasol (TEMOVATE) 0.05 % external solution Apply 1 application. topically 2 (two) times a week. Monday and Friday  ? diltiazem (CARDIZEM) 120 MG tablet Take 120 mg by mouth every 8 (eight) hours.  ?  diphenoxylate-atropine (LOMOTIL) 2.5-0.025 MG/5ML liquid Place 5 mLs into feeding tube 3 (three) times daily as needed for diarrhea or loose stools.  ? ELIQUIS 5 MG TABS tablet Take 1 tablet (5 mg total) by mouth 2 (two) times daily.  ? eszo

## 2022-04-27 ENCOUNTER — Non-Acute Institutional Stay (SKILLED_NURSING_FACILITY): Payer: Medicare HMO | Admitting: Adult Health

## 2022-04-27 ENCOUNTER — Other Ambulatory Visit: Payer: Self-pay | Admitting: Adult Health

## 2022-04-27 ENCOUNTER — Encounter: Payer: Self-pay | Admitting: Adult Health

## 2022-04-27 DIAGNOSIS — G894 Chronic pain syndrome: Secondary | ICD-10-CM

## 2022-04-27 MED ORDER — HYDROCODONE-ACETAMINOPHEN 10-325 MG PO TABS: 0.5000 | ORAL_TABLET | Freq: Four times a day (QID) | ORAL | 0 refills | Status: DC | PRN

## 2022-04-27 MED ORDER — HYDROCODONE-ACETAMINOPHEN 5-325MG PREPACK (~~LOC~~: ORAL_TABLET | ORAL | 0 refills | Status: DC

## 2022-04-27 NOTE — Progress Notes (Signed)
?Location:  [place] [facility] Room Number: 159-P ?Place of Service:  SNF (31) ? ? ?CODE STATUS: DNR ? ?Allergies  ?Allergen Reactions  ? Buprenorphine Hcl Itching  ? Cymbalta [Duloxetine Hcl] Other (See Comments)  ?  Other reaction(s): Other (See Comments) ?Personality changes - crying  2014  ? Morphine And Related Itching  ? Oxycodone-Acetaminophen Other (See Comments)  ?  Personality changes   ? Valium [Diazepam] Other (See Comments)  ?  Personality changes - "in another world" ;  Hallucinations  2015  ? Statins Other (See Comments)  ?  Other reaction(s): Other (See Comments) ?Muscle weakness ?Muscle weakness  ? Morphine Itching  ? Altace [Ramipril] Other (See Comments)  ?  unknown  ? Floxin [Ofloxacin] Other (See Comments)  ?  unknown  ? Hydromorphone Other (See Comments)  ?  Other reaction(s): Confusion (intolerance) ?unknown  ? Lovaza [Omega-3-Acid Ethyl Esters] Other (See Comments)  ?  Muscle weakness   ? Trilipix [Choline Fenofibrate] Other (See Comments)  ?  Muscle weakness   ? Zetia [Ezetimibe] Other (See Comments)  ?  Muscle weakness   ? ? ?Chief Complaint  ?Patient presents with  ? Acute Visit  ?  Family Concerns   ? ? ?HPI: ? ?Her family has been concerned that her robaxin was making her lethargic. This was changed to a prn basis. She did receive one dose over the past weekend. Today she remains lethargic. She is laying with her head back and mouth open. She did have one episode of vomiting over the weekend and her tube feeding was held. There have been no further vomiting episodes present. No reports of fevers present.  ? ?Past Medical History:  ?Diagnosis Date  ? Abnormal nuclear cardiac imaging test   ? Anemia   ? Anemia, iron deficiency 07/02/2015  ? Anxiety   ? Arthritis   ? "knees, back, fingers, toes; joints" (01/07/2015)  ? 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 CAD (coronary artery disease)   ? a. Abnl nuc 11/2014. Cath 12/2014 - turned down for CABG. Ultimately s/p TTVP, rotational  atherectomy, PTCA and stenting of the ostial LCx and left main into the LAD (crush technique), and IVUS of the LAD/Left main. // b. Myoview 11/17: EF 48, poor quality/significant artifact; inf-lateral, inferior ischemia; Intermediate Risk  ? Chronic atrial fibrillation (HCC)   ? a.  First noted post-op 9/15 spinal fusion.  She had cardioversion, not on anticoagulation. Fall risk, unsteady. // failed DCCV // Holter 10/17: AFib, Avg HR 97, PVCs, no other arrhythmia  ? Chronic back pain greater than 3 months duration   ? a. spinal stenosis.  Spinal fusion with rods in 2/15 at [facility] spinal fusion 9/15.  ? Chronic diastolic CHF   ? Echo 2/17:  EF 50-55%, trivial AI, midl MR, mod LAE, PASP 37 mmHg // Echocardiogram 03/2020: EF 45-50, no RWMA, mild LVH, mild reduced RVSF, mildly elevated PASP (RVSP 38.3), mild LAE, mild MR, mild to mod TR, trivial AI // Echo 9/21: EF 45, mild LVH, mildly reduced RV SF, moderate LAE, mild RAE, mild MR, mild AI     ? Circadian rhythm sleep disorder   ? CTS (carpal tunnel syndrome)   ? Deficiency anemia 11/10/2014  ? Depression   ? Diarrhea   ? Diverticulitis of colon   ? Esophageal stricture   ? Gastritis   ? Gastroesophageal hernia 07/06/2013  ? GERD (gastroesophageal reflux disease)   ? Hiatal hernia   ? History of blood  transfusion   ? "most of them related to OR's"   ? HTN (hypertension)   ? Hyperlipidemia   ? IBS (irritable bowel syndrome)   ? Incontinence 10/13/2012  ? Insomnia   ? Ischemic chest pain ([place])   ? Memory disorder 12/04/2014  ? Metabolic syndrome 62/56/3893  ? MGUS (monoclonal gammopathy of unknown significance) dx'd 11/2014  ? a. Neg BMB 11/2014.  ? Multiple falls   ? Obesity   ? Obstructive sleep apnea   ? "have mask; don't wear it" (01/07/2015)  ? Orthostasis   ? PAT (paroxysmal atrial tachycardia) ([place])   ? Personal history of colonic polyps 10/25/2011 & 12/02/11  ? not retrieved Dr Lyla Son & tubular adenomas  ? Pneumonia 03/2014  ? Sinus bradycardia   ?  a. Baseline HR 50s-60s.  ? Stroke [facility]) early 2000's  ? "small"; denies residual on 01/07/2015)  ? ? ?Past Surgical History:  ?Procedure Laterality Date  ? BACK SURGERY    ? BONE MARROW BIOPSY  11/2014  ? CARDIAC CATHETERIZATION  12/27/2014  ? Procedure: INTRAVASCULAR PRESSURE WIRE/FFR STUDY;  Surgeon: Larey Dresser, MD;  Location: [facility] CATH LAB;  Service: Cardiovascular;;  ? CARDIAC CATHETERIZATION N/A 11/25/2016  ? Procedure: Left Heart Cath and Coronary Angiography;  Surgeon: Sherren Mocha, MD;  Location: [place] CV LAB;  Service: Cardiovascular;  Laterality: N/A;  ? CARDIOVERSION N/A 02/13/2016  ? Procedure: CARDIOVERSION;  Surgeon: Josue Hector, MD;  Location: [facility] ENDOSCOPY;  Service: Cardiovascular;  Laterality: N/A;  ? CARPAL TUNNEL RELEASE Right 1980's  ? CATARACT EXTRACTION Bilateral   ? CATARACT EXTRACTION W/ INTRAOCULAR LENS  IMPLANT, BILATERAL  2000's  ? CORONARY ANGIOPLASTY WITH STENT PLACEMENT  01/07/2015  ? "2"  ? CORONARY STENT PLACEMENT    ? DILATION AND CURETTAGE OF UTERUS    ? ESOPHAGOGASTRODUODENOSCOPY (EGD) WITH ESOPHAGEAL DILATION  "several times"  ? JOINT REPLACEMENT    ? KNEE ARTHROSCOPY Left 1995  ? LAPAROSCOPIC CHOLECYSTECTOMY  2003  ? LEFT HEART CATHETERIZATION WITH CORONARY ANGIOGRAM N/A 12/27/2014  ? Procedure: LEFT HEART CATHETERIZATION WITH CORONARY ANGIOGRAM;  Surgeon: Larey Dresser, MD;  Location: [facility] CATH LAB;  Service: Cardiovascular;  Laterality: N/A;  ? PERCUTANEOUS CORONARY ROTOBLATOR INTERVENTION (PCI-R)  01/07/2015  ? PERCUTANEOUS CORONARY ROTOBLATOR INTERVENTION (PCI-R) N/A 01/07/2015  ? Procedure: PERCUTANEOUS CORONARY ROTOBLATOR INTERVENTION (PCI-R);  Surgeon: Blane Ohara, MD;  Location: [facility] CATH LAB;  Service: Cardiovascular;  Laterality: N/A;  ? POSTERIOR FUSION THORACIC SPINE  08/2015  ? POSTERIOR LUMBAR FUSION  01/2015  ? TOTAL KNEE ARTHROPLASTY Bilateral 1990's - 2000's  ? ? ?Social History  ? ?Socioeconomic History  ? Marital status: Widowed  ?  Spouse name: Not on  file  ? Number of children: 2  ? Years of education: HS  ? Highest education level: Not on file  ?Occupational History  ? Occupation: Licensed conveyancer- retired  ?  Comment: retired  ?Tobacco Use  ? Smoking status: Never  ? Smokeless tobacco: Never  ?Vaping Use  ? Vaping Use: Never used  ?Substance and Sexual Activity  ? Alcohol use: No  ?  Comment: 01/07/2015 "glass of wine at Christmas, maybe"  ? Drug use: No  ? Sexual activity: Not Currently  ?Other Topics Concern  ? Not on file  ?Social History Narrative  ? Patient is right handed  ? Patient drinks 1-2 sodas daily.  ? patlient lives alone.  ? ?Social Determinants of Health  ? ?Financial Resource Strain: Not on file  ?Food  Insecurity: Not on file  ?Transportation Needs: Not on file  ?Physical Activity: Not on file  ?Stress: Not on file  ?Social Connections: Not on file  ?Intimate Partner Violence: Not on file  ? ?Family History  ?Problem Relation Age of Onset  ? Coronary artery disease Father   ? Peripheral vascular disease Father   ? Coronary artery disease Mother   ? Coronary artery disease Brother   ? Colon cancer Sister   ? Emphysema Sister   ? Sleep apnea Son   ? Colon cancer Sister   ?     spread to her brain  ? Emphysema Brother   ? Dementia Neg Hx   ? ? ? ? ?VITAL SIGNS ?BP 125/62   Pulse 78   Temp 98 ?F (36.7 ?C)   Resp (!) 22   Ht _0  (1.676 m)   Wt 160 lb 6.4 oz (72.8 kg)   SpO2 95%   BMI 25.89 kg/m?  ? ?Outpatient Encounter Medications as of 04/27/2022  ?Medication Sig  ? albuterol (VENTOLIN HFA) 108 (90 Base) MCG/ACT inhaler Inhale 2 puffs into the lungs every 6 (six) hours as needed for wheezing or shortness of breath.  ? aspirin EC 81 MG tablet Take 81 mg by mouth daily. Swallow whole.  ? Balsam [place]-Castor Oil (VENELEX) OINT Apply 1 application. topically daily.  ? Cholecalciferol 25 MCG (1000 UT) tablet Place 1,000 Units into feeding tube daily. 9 am  ? clobetasol (TEMOVATE) 0.05 % external solution Apply 1 application. topically 2 (two) times a  week. Monday and Friday  ? diclofenac Sodium (VOLTAREN) 1 % GEL Apply 2 g topically 3 (three) times daily. to neck and shoulders for pain management  ? diltiazem (CARDIZEM) 120 MG tablet Take 120 mg by mouth every

## 2022-05-01 ENCOUNTER — Ambulatory Visit: Payer: Medicare HMO | Admitting: Podiatry

## 2022-05-11 ENCOUNTER — Other Ambulatory Visit: Payer: Self-pay | Admitting: Adult Health

## 2022-05-11 MED ORDER — HYDROCODONE-ACETAMINOPHEN 10-325 MG PO TABS: 0.5000 | ORAL_TABLET | Freq: Four times a day (QID) | ORAL | 0 refills | Status: AC | PRN

## 2022-05-11 MED ORDER — FENTANYL 50 MCG/HR TD PT72: 1.0000 | MEDICATED_PATCH | TRANSDERMAL | 0 refills | Status: DC

## 2022-05-19 ENCOUNTER — Other Ambulatory Visit: Payer: Self-pay | Admitting: Adult Health

## 2022-05-19 ENCOUNTER — Encounter: Payer: Self-pay | Admitting: Adult Health

## 2022-05-19 ENCOUNTER — Non-Acute Institutional Stay (SKILLED_NURSING_FACILITY): Payer: Medicare HMO | Admitting: Adult Health

## 2022-05-19 DIAGNOSIS — K219 Gastro-esophageal reflux disease without esophagitis: Secondary | ICD-10-CM

## 2022-05-19 DIAGNOSIS — K222 Esophageal obstruction: Secondary | ICD-10-CM

## 2022-05-19 DIAGNOSIS — I5032 Chronic diastolic (congestive) heart failure: Secondary | ICD-10-CM

## 2022-05-19 DIAGNOSIS — I4821 Permanent atrial fibrillation: Secondary | ICD-10-CM

## 2022-05-19 DIAGNOSIS — I25110 Atherosclerotic heart disease of native coronary artery with unstable angina pectoris: Secondary | ICD-10-CM | POA: Diagnosis not present

## 2022-05-19 DIAGNOSIS — F339 Major depressive disorder, recurrent, unspecified: Secondary | ICD-10-CM

## 2022-05-19 MED ORDER — ESZOPICLONE 3 MG PO TABS: 3.0000 mg | ORAL_TABLET | Freq: Every day | ORAL | 0 refills | Status: DC

## 2022-05-19 NOTE — Progress Notes (Unsigned)
Location:  [place] Room Number: 201E Place of Service:  SNF (31) Provider:  Ok Edwards, NP   CODE STATUS: DNR  Allergies  Allergen Reactions   Buprenorphine Hcl Itching   Cymbalta [Duloxetine Hcl] Other (See Comments)    Other reaction(s): Other (See Comments) Personality changes - crying  2014   Morphine And Related Itching   Oxycodone-Acetaminophen Other (See Comments)    Personality changes    Valium [Diazepam] Other (See Comments)    Personality changes - "in another world" ;  Hallucinations  2015   Statins Other (See Comments)    Other reaction(s): Other (See Comments) Muscle weakness Muscle weakness   Morphine Itching   Altace [Ramipril] Other (See Comments)    unknown   Floxin [Ofloxacin] Other (See Comments)    unknown   Hydromorphone Other (See Comments)    Other reaction(s): Confusion (intolerance) unknown   Lovaza [Omega-3-Acid Ethyl Esters] Other (See Comments)    Muscle weakness    Trilipix [Choline Fenofibrate] Other (See Comments)    Muscle weakness    Zetia [Ezetimibe] Other (See Comments)    Muscle weakness     Chief Complaint  Patient presents with   Medical Management of Chronic Issues    Routine follow up    HPI:    Past Medical History:  Diagnosis Date   Abnormal nuclear cardiac imaging test    Anemia    Anemia, iron deficiency 07/02/2015   Anxiety    Arthritis    "knees, back, fingers, toes; joints" (01/07/2015)   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 Abnl nuc 11/2014. Cath 12/2014 - turned down for CABG. Ultimately s/p TTVP, rotational atherectomy, PTCA and stenting of the ostial LCx and left main into the LAD (crush technique), and IVUS of the LAD/Left main. // b. Myoview 11/17: EF 48, poor quality/significant artifact; inf-lateral, inferior ischemia; Intermediate Risk   Chronic atrial fibrillation ([place])    a.  First noted post-op 9/15 spinal fusion.  She had cardioversion,  not on anticoagulation. Fall risk, unsteady. // failed DCCV // Holter 10/17: AFib, Avg HR 97, PVCs, no other arrhythmia   Chronic back pain greater than 3 months duration    a. spinal stenosis.  Spinal fusion with rods in 2/15 at [facility] spinal fusion 9/15.   Chronic diastolic CHF    Echo 0/71:  EF 50-55%, trivial AI, midl MR, mod LAE, PASP 37 mmHg // Echocardiogram 03/2020: EF 45-50, no RWMA, mild LVH, mild reduced RVSF, mildly elevated PASP (RVSP 38.3), mild LAE, mild MR, mild to mod TR, trivial AI // Echo 9/21: EF 45, mild LVH, mildly reduced RV SF, moderate LAE, mild RAE, mild MR, mild AI      Circadian rhythm sleep disorder    CTS (carpal tunnel syndrome)    Deficiency anemia 11/10/2014   Depression    Diarrhea    Diverticulitis of colon    Esophageal stricture    Gastritis    Gastroesophageal hernia 07/06/2013   GERD (gastroesophageal reflux disease)    Hiatal hernia    History of blood transfusion    "most of them related to OR's"    HTN (hypertension)    Hyperlipidemia    IBS (irritable bowel syndrome)    Incontinence 10/13/2012   Insomnia    Ischemic chest pain ([place])    Memory disorder 21/97/5883   Metabolic syndrome 25/49/8264   MGUS (monoclonal gammopathy of unknown significance) dx'd 11/2014  a. Neg BMB 11/2014.   Multiple falls    Obesity    Obstructive sleep apnea    "have mask; don't wear it" (01/07/2015)   Orthostasis    PAT (paroxysmal atrial tachycardia) ([place])    Personal history of colonic polyps 10/25/2011 & 12/02/11   not retrieved Dr Lyla Son & tubular adenomas   Pneumonia 03/2014   Sinus bradycardia    a. Baseline HR 50s-60s.   Stroke [facility]) early 2000's   "small"; denies residual on 01/07/2015)    Past Surgical History:  Procedure Laterality Date   BACK SURGERY     BONE MARROW BIOPSY  11/2014   CARDIAC CATHETERIZATION  12/27/2014   Procedure: INTRAVASCULAR PRESSURE WIRE/FFR STUDY;  Surgeon: Larey Dresser, MD;  Location: [facility] CATH LAB;   Service: Cardiovascular;;   CARDIAC CATHETERIZATION N/A 11/25/2016   Procedure: Left Heart Cath and Coronary Angiography;  Surgeon: Sherren Mocha, MD;  Location: [place] CV LAB;  Service: Cardiovascular;  Laterality: N/A;   CARDIOVERSION N/A 02/13/2016   Procedure: CARDIOVERSION;  Surgeon: Josue Hector, MD;  Location: [place];  Service: Cardiovascular;  Laterality: N/A;   CARPAL TUNNEL RELEASE Right 1980's   CATARACT EXTRACTION Bilateral    CATARACT EXTRACTION W/ INTRAOCULAR LENS  IMPLANT, BILATERAL  2000's   CORONARY ANGIOPLASTY WITH STENT PLACEMENT  01/07/2015   "2"   CORONARY STENT PLACEMENT     DILATION AND CURETTAGE OF UTERUS     ESOPHAGOGASTRODUODENOSCOPY (EGD) WITH ESOPHAGEAL DILATION  "several times"   JOINT REPLACEMENT     KNEE ARTHROSCOPY Left 1995   LAPAROSCOPIC CHOLECYSTECTOMY  2003   LEFT HEART CATHETERIZATION WITH CORONARY ANGIOGRAM N/A 12/27/2014   Procedure: LEFT HEART CATHETERIZATION WITH CORONARY ANGIOGRAM;  Surgeon: Larey Dresser, MD;  Location: [facility] CATH LAB;  Service: Cardiovascular;  Laterality: N/A;   PERCUTANEOUS CORONARY ROTOBLATOR INTERVENTION (PCI-R)  01/07/2015   PERCUTANEOUS CORONARY ROTOBLATOR INTERVENTION (PCI-R) N/A 01/07/2015   Procedure: PERCUTANEOUS CORONARY ROTOBLATOR INTERVENTION (PCI-R);  Surgeon: Blane Ohara, MD;  Location: [facility] CATH LAB;  Service: Cardiovascular;  Laterality: N/A;   POSTERIOR FUSION THORACIC SPINE  08/2015   POSTERIOR LUMBAR FUSION  01/2015   TOTAL KNEE ARTHROPLASTY Bilateral 1990's - 2000's    Social History   Socioeconomic History   Marital status: Widowed    Spouse name: Not on file   Number of children: 2   Years of education: HS   Highest education level: Not on file  Occupational History   Occupation: Licensed conveyancer- retired    Comment: retired  Tobacco Use   Smoking status: Never   Smokeless tobacco: Never  Vaping Use   Vaping Use: Never used  Substance and Sexual Activity   Alcohol use: No    Comment:  01/07/2015 "glass of wine at Christmas, maybe"   Drug use: No   Sexual activity: Not Currently  Other Topics Concern   Not on file  Social History Narrative   Patient is right handed   Patient drinks 1-2 sodas daily.   patlient lives alone.   Social Determinants of Health   Financial Resource Strain: Not on file  Food Insecurity: Not on file  Transportation Needs: Not on file  Physical Activity: Not on file  Stress: Not on file  Social Connections: Not on file  Intimate Partner Violence: Not on file   Family History  Problem Relation Age of Onset   Coronary artery disease Father    Peripheral vascular disease Father    Coronary artery disease Mother  Coronary artery disease Brother    Colon cancer Sister    Emphysema Sister    Sleep apnea Son    Colon cancer Sister        spread to her brain   Emphysema Brother    Dementia Neg Hx       VITAL SIGNS BP (!) 107/57   Pulse 83   Temp 97.6 F (36.4 C)   Ht 5' 6"  (1.676 m)   Wt 165 lb 3.2 oz (74.9 kg)   BMI 26.66 kg/m   Outpatient Encounter Medications as of 05/19/2022  Medication Sig   albuterol (VENTOLIN HFA) 108 (90 Base) MCG/ACT inhaler Inhale 2 puffs into the lungs every 6 (six) hours as needed for wheezing or shortness of breath.   Amino Acids-Protein Hydrolys (PRO-STAT PO) Take 30 mLs by mouth daily. In tube   aspirin EC 81 MG tablet Take 81 mg by mouth daily. Swallow whole.   Balsam [place]-Castor Oil (VENELEX) OINT Apply 1 application. topically daily.   Cholecalciferol 25 MCG (1000 UT) tablet Place 1,000 Units into feeding tube daily. 9 am   clobetasol (TEMOVATE) 0.05 % external solution Apply 1 application. topically 2 (two) times a week. Monday and Friday   diclofenac Sodium (VOLTAREN) 1 % GEL Apply 2 g topically 3 (three) times daily. to neck and shoulders for pain management   diltiazem (CARDIZEM) 120 MG tablet Take 120 mg by mouth every 8 (eight) hours.   diphenoxylate-atropine (LOMOTIL) 2.5-0.025 MG/5ML  liquid Place 5 mLs into feeding tube 3 (three) times daily as needed for diarrhea or loose stools.   ELIQUIS 5 MG TABS tablet Take 1 tablet (5 mg total) by mouth 2 (two) times daily.   eszopiclone 3 MG TABS Take 1 tablet (3 mg total) by mouth at bedtime. Take immediately before bedtime   fentaNYL (DURAGESIC) 50 MCG/HR Place 1 patch onto the skin every 3 (three) days.   FLUoxetine (PROZAC) 20 MG/5ML solution Place 40 mg into feeding tube daily.   furosemide (LASIX) 40 MG tablet Place 40 mg into feeding tube daily. 9 am   HYDROcodone-acetaminophen (NORCO) 10-325 MG tablet Take 0.5 tablets by mouth every 6 (six) hours as needed.   LINZESS 145 MCG CAPS capsule Place 145 mcg into feeding tube daily as needed (constipation).   loperamide HCl (IMODIUM) 1 MG/7.5ML suspension Place 2 mg into feeding tube 4 (four) times daily as needed for diarrhea or loose stools.   meclizine (ANTIVERT) 25 MG tablet Place 25 mg into feeding tube 3 (three) times daily as needed for dizziness.   naloxone (NARCAN) nasal spray 4 mg/0.1 mL Special Instructions: As needed for depressed respirations less than 8/minute. Notify provider if given. If no effectiveness or improvement, contact 911. As Needed   nitroGLYCERIN (NITROSTAT) 0.4 MG SL tablet Place 1 tablet (0.4 mg total) under the tongue every 5 (five) minutes as needed for chest pain (up to 3 doses).   Nutritional Supplements (FEEDING SUPPLEMENT, JEVITY 1.2 CAL,) LIQD Place into feeding tube as directed. 50 cc/hr Every Shift; Day, Evening, Night   Omeprazole-Sodium Bicarbonate (KONVOMEP) 2-84 MG/ML SUSR Give 10 mLs by tube in the morning and at bedtime.   rOPINIRole (REQUIP) 0.5 MG tablet Place 0.5 mg into feeding tube in the morning and at bedtime.   rosuvastatin (CRESTOR) 10 MG tablet Place 10 mg into feeding tube daily. 9 am   [DISCONTINUED] SODIUM CHLORIDE IV Inject into the vein. amt: 100 cc per hour; intravenous Special Instructions: FOR 2 LITERS ONLY  for acute renal  failure   No facility-administered encounter medications on file as of 05/19/2022.     SIGNIFICANT DIAGNOSTIC EXAMS       ASSESSMENT/ PLAN:     Ok Edwards NP [facility] Adult Medicine  Contact [phone] Monday through Friday 8am- 5pm  After hours call [phone]

## 2022-05-21 ENCOUNTER — Non-Acute Institutional Stay (SKILLED_NURSING_FACILITY): Payer: Medicare HMO | Admitting: Adult Health

## 2022-05-21 ENCOUNTER — Encounter: Payer: Self-pay | Admitting: Adult Health

## 2022-05-21 ENCOUNTER — Other Ambulatory Visit (HOSPITAL_COMMUNITY)
Admission: RE | Admit: 2022-05-21 | Discharge: 2022-05-21 | Disposition: A | Discharge status: Home / Self Care | Payer: Medicare HMO | Source: Skilled Nursing Facility | Attending: Adult Health | Admitting: Adult Health

## 2022-05-21 DIAGNOSIS — E43 Unspecified severe protein-calorie malnutrition: Secondary | ICD-10-CM

## 2022-05-21 LAB — COMPREHENSIVE METABOLIC PANEL
ALT: 17 U/L (ref 0–44)
AST: 19 U/L (ref 15–41)
Albumin: 2.4 g/dL — ABNORMAL LOW (ref 3.5–5.0)
Alkaline Phosphatase: 100 U/L (ref 38–126)
Anion gap: 5 (ref 5–15)
BUN: 19 mg/dL (ref 8–23)
CO2: 31 mmol/L (ref 22–32)
Calcium: 8.5 mg/dL — ABNORMAL LOW (ref 8.9–10.3)
Chloride: 99 mmol/L (ref 98–111)
Creatinine, Ser: 0.5 mg/dL (ref 0.44–1.00)
GFR, Estimated: >60 mL/min (ref >60)
Glucose, Bld: 113 mg/dL — ABNORMAL HIGH (ref 70–99)
Potassium: 4.2 mmol/L (ref 3.5–5.1)
Sodium: 135 mmol/L (ref 135–145)
Total Bilirubin: 0.4 mg/dL (ref 0.3–1.2)
Total Protein: 5.2 g/dL — ABNORMAL LOW (ref 6.5–8.1)

## 2022-05-21 NOTE — Progress Notes (Signed)
Location:  [place] Room Number: 159-P Place of Service:  SNF (31)   CODE STATUS: DNR  Allergies  Allergen Reactions   Buprenorphine Hcl Itching   Cymbalta [Duloxetine Hcl] Other (See Comments)    Other reaction(s): Other (See Comments) Personality changes - crying  2014   Morphine And Related Itching   Oxycodone-Acetaminophen Other (See Comments)    Personality changes    Valium [Diazepam] Other (See Comments)    Personality changes - "in another world" ;  Hallucinations  2015   Statins Other (See Comments)    Other reaction(s): Other (See Comments) Muscle weakness Muscle weakness   Morphine Itching   Altace [Ramipril] Other (See Comments)    unknown   Floxin [Ofloxacin] Other (See Comments)    unknown   Hydromorphone Other (See Comments)    Other reaction(s): Confusion (intolerance) unknown   Lovaza [Omega-3-Acid Ethyl Esters] Other (See Comments)    Muscle weakness    Trilipix [Choline Fenofibrate] Other (See Comments)    Muscle weakness    Zetia [Ezetimibe] Other (See Comments)    Muscle weakness     Chief Complaint  Patient presents with   Acute Visit    Labs    HPI:  She has had labs done today which demonstrate severe protein malnutrition; with albumin 2.4 and protein 5.2. she remains on tube feeding 20 hours daily. She is npo except for ice chips. 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 Abnl nuc 11/2014. Cath 12/2014 - turned down for CABG. Ultimately s/p TTVP, rotational atherectomy, PTCA and stenting of the ostial LCx and left main into the LAD (crush technique), and IVUS of the LAD/Left main. // b. Myoview 11/17: EF 48, poor  quality/significant artifact; inf-lateral, inferior ischemia; Intermediate Risk   Chronic atrial fibrillation ([place])    a.  First noted post-op 9/15 spinal fusion.  She had cardioversion, not on anticoagulation. Fall risk, unsteady. // failed DCCV // Holter 10/17: AFib, Avg HR 97, PVCs, no other arrhythmia   Chronic back pain greater than 3 months duration    a. spinal stenosis.  Spinal fusion with rods in 2/15 at [facility] spinal fusion 9/15.   Chronic diastolic CHF    Echo 0/99:  EF 50-55%, trivial AI, midl MR, mod LAE, PASP 37 mmHg // Echocardiogram 03/2020: EF 45-50, no RWMA, mild LVH, mild reduced RVSF, mildly elevated PASP (RVSP 38.3), mild LAE, mild MR, mild to mod TR, trivial AI // Echo 9/21: EF 45, mild LVH, mildly reduced RV SF, moderate LAE, mild RAE, mild MR, mild AI      Circadian rhythm sleep disorder    CTS (carpal tunnel syndrome)    Deficiency anemia 11/10/2014   Depression    Diarrhea    Diverticulitis of colon    Esophageal stricture    Gastritis    Gastroesophageal hernia 07/06/2013   GERD (gastroesophageal reflux disease)    Hiatal hernia    History of blood transfusion    "most of them related to OR's"    HTN (hypertension)    Hyperlipidemia    IBS (irritable bowel syndrome)    Incontinence 10/13/2012  Insomnia    Ischemic chest pain ([place])    Memory disorder 17/40/8144   Metabolic syndrome 81/85/6314   MGUS (monoclonal gammopathy of unknown significance) dx'd 11/2014   a. Neg BMB 11/2014.   Multiple falls    Obesity    Obstructive sleep apnea    "have mask; don't wear it" (01/07/2015)   Orthostasis    PAT (paroxysmal atrial tachycardia) ([place])    Personal history of colonic polyps 10/25/2011 & 12/02/11   not retrieved Dr Lyla Son & tubular adenomas   Pneumonia 03/2014   Sinus bradycardia    a. Baseline HR 50s-60s.   Stroke [facility]) early 2000's   "small"; denies residual on 01/07/2015)    Past Surgical History:  Procedure Laterality Date   BACK  SURGERY     BONE MARROW BIOPSY  11/2014   CARDIAC CATHETERIZATION  12/27/2014   Procedure: INTRAVASCULAR PRESSURE WIRE/FFR STUDY;  Surgeon: Larey Dresser, MD;  Location: [facility] CATH LAB;  Service: Cardiovascular;;   CARDIAC CATHETERIZATION N/A 11/25/2016   Procedure: Left Heart Cath and Coronary Angiography;  Surgeon: Sherren Mocha, MD;  Location: [place] CV LAB;  Service: Cardiovascular;  Laterality: N/A;   CARDIOVERSION N/A 02/13/2016   Procedure: CARDIOVERSION;  Surgeon: Josue Hector, MD;  Location: [place];  Service: Cardiovascular;  Laterality: N/A;   CARPAL TUNNEL RELEASE Right 1980's   CATARACT EXTRACTION Bilateral    CATARACT EXTRACTION W/ INTRAOCULAR LENS  IMPLANT, BILATERAL  2000's   CORONARY ANGIOPLASTY WITH STENT PLACEMENT  01/07/2015   "2"   CORONARY STENT PLACEMENT     DILATION AND CURETTAGE OF UTERUS     ESOPHAGOGASTRODUODENOSCOPY (EGD) WITH ESOPHAGEAL DILATION  "several times"   JOINT REPLACEMENT     KNEE ARTHROSCOPY Left 1995   LAPAROSCOPIC CHOLECYSTECTOMY  2003   LEFT HEART CATHETERIZATION WITH CORONARY ANGIOGRAM N/A 12/27/2014   Procedure: LEFT HEART CATHETERIZATION WITH CORONARY ANGIOGRAM;  Surgeon: Larey Dresser, MD;  Location: [facility] CATH LAB;  Service: Cardiovascular;  Laterality: N/A;   PERCUTANEOUS CORONARY ROTOBLATOR INTERVENTION (PCI-R)  01/07/2015   PERCUTANEOUS CORONARY ROTOBLATOR INTERVENTION (PCI-R) N/A 01/07/2015   Procedure: PERCUTANEOUS CORONARY ROTOBLATOR INTERVENTION (PCI-R);  Surgeon: Blane Ohara, MD;  Location: [facility] CATH LAB;  Service: Cardiovascular;  Laterality: N/A;   POSTERIOR FUSION THORACIC SPINE  08/2015   POSTERIOR LUMBAR FUSION  01/2015   TOTAL KNEE ARTHROPLASTY Bilateral 1990's - 2000's    Social History   Socioeconomic History   Marital status: Widowed    Spouse name: Not on file   Number of children: 2   Years of education: HS   Highest education level: Not on file  Occupational History   Occupation: Licensed conveyancer- retired     Comment: retired  Tobacco Use   Smoking status: Never   Smokeless tobacco: Never  Vaping Use   Vaping Use: Never used  Substance and Sexual Activity   Alcohol use: No    Comment: 01/07/2015 "glass of wine at Christmas, maybe"   Drug use: No   Sexual activity: Not Currently  Other Topics Concern   Not on file  Social History Narrative   Patient is right handed   Patient drinks 1-2 sodas daily.   patlient lives alone.   Social Determinants of Health   Financial Resource Strain: Not on file  Food Insecurity: Not on file  Transportation Needs: Not on file  Physical Activity: Not on file  Stress: Not on file  Social Connections: Not on file  Intimate Partner Violence: Not on  file   Family History  Problem Relation Age of Onset   Coronary artery disease Father    Peripheral vascular disease Father    Coronary artery disease Mother    Coronary artery disease Brother    Colon cancer Sister    Emphysema Sister    Sleep apnea Son    Colon cancer Sister        spread to her brain   Emphysema Brother    Dementia Neg Hx       VITAL SIGNS BP (!) 107/57   Pulse 83   Temp 97.8 F (36.6 C)   Resp 20   Ht 5' 6"  (1.676 m)   Wt 165 lb 3.2 oz (74.9 kg)   SpO2 97%   BMI 26.66 kg/m   Outpatient Encounter Medications as of 05/21/2022  Medication Sig   albuterol (VENTOLIN HFA) 108 (90 Base) MCG/ACT inhaler Inhale 2 puffs into the lungs every 6 (six) hours as needed for wheezing or shortness of breath.   Amino Acids-Protein Hydrolys (PRO-STAT PO) Take 30 mLs by mouth daily. In tube   aspirin EC 81 MG tablet Take 81 mg by mouth daily. Swallow whole.   Balsam [place]-Castor Oil (VENELEX) OINT Apply 1 application. topically daily.   Cholecalciferol 25 MCG (1000 UT) tablet Place 1,000 Units into feeding tube daily. 9 am   clobetasol (TEMOVATE) 0.05 % external solution Apply 1 application. topically 2 (two) times a week. Monday and Friday   diclofenac Sodium (VOLTAREN) 1 % GEL Apply 2 g  topically 3 (three) times daily. to neck and shoulders for pain management   diltiazem (CARDIZEM) 120 MG tablet Take 120 mg by mouth every 8 (eight) hours.   diphenoxylate-atropine (LOMOTIL) 2.5-0.025 MG/5ML liquid Place 5 mLs into feeding tube 3 (three) times daily as needed for diarrhea or loose stools.   ELIQUIS 5 MG TABS tablet Take 1 tablet (5 mg total) by mouth 2 (two) times daily.   eszopiclone 3 MG TABS Take 1 tablet (3 mg total) by mouth at bedtime. Take immediately before bedtime   fentaNYL (DURAGESIC) 50 MCG/HR Place 1 patch onto the skin every 3 (three) days.   FLUoxetine (PROZAC) 20 MG/5ML solution Place 60 mg into feeding tube daily.   furosemide (LASIX) 40 MG tablet Place 40 mg into feeding tube daily. 9 am   HYDROcodone-acetaminophen (NORCO) 10-325 MG tablet Take 0.5 tablets by mouth every 6 (six) hours as needed.   LINZESS 145 MCG CAPS capsule Place 145 mcg into feeding tube daily as needed (constipation).   loperamide HCl (IMODIUM) 1 MG/7.5ML suspension Place 2 mg into feeding tube 4 (four) times daily as needed for diarrhea or loose stools.   meclizine (ANTIVERT) 25 MG tablet Place 25 mg into feeding tube 3 (three) times daily as needed for dizziness.   naloxone (NARCAN) nasal spray 4 mg/0.1 mL Special Instructions: As needed for depressed respirations less than 8/minute. Notify provider if given. If no effectiveness or improvement, contact 911. As Needed   nitroGLYCERIN (NITROSTAT) 0.4 MG SL tablet Place 1 tablet (0.4 mg total) under the tongue every 5 (five) minutes as needed for chest pain (up to 3 doses).   Nutritional Supplements (FEEDING SUPPLEMENT, JEVITY 1.2 CAL,) LIQD Place into feeding tube as directed. 50 cc/hr Every Shift; Day, Evening, Night   Omeprazole-Sodium Bicarbonate (KONVOMEP) 2-84 MG/ML SUSR Give 10 mLs by tube in the morning and at bedtime.   rOPINIRole (REQUIP) 0.5 MG tablet Place 0.5 mg into feeding tube in the  morning and at bedtime.   rosuvastatin  (CRESTOR) 10 MG tablet Place 10 mg into feeding tube daily. 9 am   No facility-administered encounter medications on file as of 05/21/2022.     SIGNIFICANT DIAGNOSTIC EXAMS  PREVIOUS   02-23-22: TEE:  The left ventricular size is normal.  Mild left ventricular hypertrophy  LV ejection fraction = 45-50%.  Left ventricular systolic function is mildly reduced.  The right ventricle is normal in size and function.  The left atrium is moderately to severely dilated.  The right atrium is mildly to moderately dilated.  Estimated right ventricular systolic pressure is 27 mmHg.  IVC size was normal.  There is no significant valvular stenosis or regurgitation.  There is no pericardial effusion.  There is a pleural effusion present.  Recommend a limited TTE when HR is controlled to reassess   NO NEW EXAMS.   LABS REVIEWED: PREVIOUS   02-27-22: glucose 102; bun 13; creat 0.52; k+ 4.8; na++ 134; ca 8.9; GFR>90 04-08-22: wbc 7.2 hgb 7.2; hct 11.9; mcv 35.2 plt 185; glucose 114; bun 11; creat 0.37; k+ 4.5; na++ 132; ca 8.6; GFR >90  TODAY  05-21-22: glucose 113; bun 19; creat 0.5; k+ 4.2; na++ 135; ca 8.5 gfr>60; protein 5.2; albumin 2.4    Review of Systems  Constitutional:  Negative for malaise/fatigue.  Respiratory:  Negative for cough and shortness of breath.   Cardiovascular:  Negative for chest pain, palpitations and leg swelling.  Gastrointestinal:  Negative for abdominal pain, constipation and heartburn.  Musculoskeletal:  Negative for back pain, joint pain and myalgias.  Skin: Negative.   Neurological:  Negative for dizziness.  Psychiatric/Behavioral:  The patient is not nervous/anxious.    Physical Exam Constitutional:      General: She is not in acute distress.    Appearance: She is well-developed. She is not diaphoretic.  Neck:     Thyroid: No thyromegaly.  Cardiovascular:     Rate and Rhythm: Normal rate and regular rhythm.     Pulses: Normal pulses.     Heart sounds:  Normal heart sounds.  Pulmonary:     Effort: Pulmonary effort is normal. No respiratory distress.     Breath sounds: Normal breath sounds.  Abdominal:     General: Bowel sounds are normal. There is no distension.     Palpations: Abdomen is soft.     Tenderness: There is no abdominal tenderness.     Comments: Peg tube present   Musculoskeletal:        General: Normal range of motion.     Cervical back: Neck supple.     Right lower leg: No edema.     Left lower leg: No edema.  Lymphadenopathy:     Cervical: No cervical adenopathy.  Skin:    General: Skin is warm and dry.  Neurological:     Mental Status: She is alert and oriented to person, place, and time.  Psychiatric:        Mood and Affect: Mood normal.     ASSESSMENT/ PLAN:  TODAY  Severe protein calorie malnutrition: protein 5.2; albumin 2.4; will begin prostat 63m three times daily    DOk EdwardsNP [facility]   call [phone]

## 2022-05-23 ENCOUNTER — Encounter (HOSPITAL_COMMUNITY)
Admission: RE | Admit: 2022-05-23 | Discharge: 2022-05-23 | Disposition: A | Discharge status: Home / Self Care | Payer: Medicare HMO | Source: Skilled Nursing Facility | Attending: Adult Health | Admitting: Adult Health

## 2022-05-23 DIAGNOSIS — R051 Acute cough: Secondary | ICD-10-CM | POA: Insufficient documentation

## 2022-05-23 LAB — CBC
HCT: 30.1 % — ABNORMAL LOW (ref 36.0–46.0)
Hemoglobin: 9.2 g/dL — ABNORMAL LOW (ref 12.0–15.0)
MCH: 28.3 pg (ref 26.0–34.0)
MCHC: 30.6 g/dL (ref 30.0–36.0)
MCV: 92.6 fL (ref 80.0–100.0)
Platelets: 184 10*3/uL (ref 150–400)
RBC: 3.25 MIL/uL — ABNORMAL LOW (ref 3.87–5.11)
RDW: 14.8 % (ref 11.5–15.5)
WBC: 5.4 10*3/uL (ref 4.0–10.5)
nRBC: 0 % (ref 0.0–0.2)

## 2022-05-23 LAB — BASIC METABOLIC PANEL
Anion gap: 5 (ref 5–15)
BUN: 21 mg/dL (ref 8–23)
CO2: 30 mmol/L (ref 22–32)
Calcium: 8.2 mg/dL — ABNORMAL LOW (ref 8.9–10.3)
Chloride: 98 mmol/L (ref 98–111)
Creatinine, Ser: 0.54 mg/dL (ref 0.44–1.00)
GFR, Estimated: >60 mL/min (ref >60)
Glucose, Bld: 123 mg/dL — ABNORMAL HIGH (ref 70–99)
Potassium: 3.8 mmol/L (ref 3.5–5.1)
Sodium: 133 mmol/L — ABNORMAL LOW (ref 135–145)

## 2022-05-25 ENCOUNTER — Non-Acute Institutional Stay (SKILLED_NURSING_FACILITY): Payer: Medicare HMO | Admitting: Adult Health

## 2022-05-25 ENCOUNTER — Encounter: Payer: Self-pay | Admitting: Adult Health

## 2022-05-25 DIAGNOSIS — E43 Unspecified severe protein-calorie malnutrition: Secondary | ICD-10-CM | POA: Insufficient documentation

## 2022-05-25 DIAGNOSIS — I5032 Chronic diastolic (congestive) heart failure: Secondary | ICD-10-CM | POA: Diagnosis not present

## 2022-05-25 DIAGNOSIS — J189 Pneumonia, unspecified organism: Secondary | ICD-10-CM

## 2022-05-25 NOTE — Progress Notes (Unsigned)
Location:  [place] Room Number: Q759/F Place of Service:  SNF (31), Ok Edwards S.,NP    CODE STATUS: DNR  Allergies  Allergen Reactions   Buprenorphine Hcl Itching   Cymbalta [Duloxetine Hcl] Other (See Comments)    Other reaction(s): Other (See Comments) Personality changes - crying  2014   Morphine And Related Itching   Oxycodone-Acetaminophen Other (See Comments)    Personality changes    Valium [Diazepam] Other (See Comments)    Personality changes - "in another world" ;  Hallucinations  2015   Statins Other (See Comments)    Other reaction(s): Other (See Comments) Muscle weakness Muscle weakness   Morphine Itching   Altace [Ramipril] Other (See Comments)    unknown   Floxin [Ofloxacin] Other (See Comments)    unknown   Hydromorphone Other (See Comments)    Other reaction(s): Confusion (intolerance) unknown   Lovaza [Omega-3-Acid Ethyl Esters] Other (See Comments)    Muscle weakness    Trilipix [Choline Fenofibrate] Other (See Comments)    Muscle weakness    Zetia [Ezetimibe] Other (See Comments)    Muscle weakness     Chief Complaint  Patient presents with   Acute Visit    Patient is being seen for chest XR F/U     HPI:  She had a chest x-ray performed over the weekend which demonstrated mild chf. She does have 2+ pitting edema present with increased shortness of breath; cough with Javia Dillow/yellow sputum. There are no reports of fevers present.   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 Abnl nuc 11/2014. Cath 12/2014 - turned down for CABG. Ultimately s/p TTVP, rotational atherectomy, PTCA and stenting of the ostial LCx and left main into the LAD (crush technique), and IVUS of the LAD/Left main. //  b. Myoview 11/17: EF 48, poor quality/significant artifact; inf-lateral, inferior ischemia; Intermediate Risk   Chronic atrial fibrillation ([place])    a.  First noted post-op 9/15 spinal fusion.  She had cardioversion, not on anticoagulation. Fall risk, unsteady. // failed DCCV // Holter 10/17: AFib, Avg HR 97, PVCs, no other arrhythmia   Chronic back pain greater than 3 months duration    a. spinal stenosis.  Spinal fusion with rods in 2/15 at [facility] spinal fusion 9/15.   Chronic diastolic CHF    Echo 6/38:  EF 50-55%, trivial AI, midl MR, mod LAE, PASP 37 mmHg // Echocardiogram 03/2020: EF 45-50, no RWMA, mild LVH, mild reduced RVSF, mildly elevated PASP (RVSP 38.3), mild LAE, mild MR, mild to mod TR, trivial AI // Echo 9/21: EF 45, mild LVH, mildly reduced RV SF, moderate LAE, mild RAE, mild MR, mild AI      Circadian rhythm sleep disorder    CTS (carpal tunnel syndrome)    Deficiency anemia 11/10/2014   Depression    Diarrhea    Diverticulitis of colon    Esophageal stricture    Gastritis    Gastroesophageal hernia 07/06/2013   GERD (gastroesophageal reflux disease)    Hiatal hernia    History of blood transfusion    "most of them related to OR's"    HTN (hypertension)    Hyperlipidemia    IBS (irritable bowel syndrome)  Incontinence 10/13/2012   Insomnia    Ischemic chest pain ([place])    Memory disorder 87/56/4332   Metabolic syndrome 95/18/8416   MGUS (monoclonal gammopathy of unknown significance) dx'd 11/2014   a. Neg BMB 11/2014.   Multiple falls    Obesity    Obstructive sleep apnea    "have mask; don't wear it" (01/07/2015)   Orthostasis    PAT (paroxysmal atrial tachycardia) ([place])    Personal history of colonic polyps 10/25/2011 & 12/02/11   not retrieved Dr Lyla Son & tubular adenomas   Pneumonia 03/2014   Sinus bradycardia    a. Baseline HR 50s-60s.   Stroke [facility]) early 2000's   "small"; denies residual on 01/07/2015)    Past Surgical History:   Procedure Laterality Date   BACK SURGERY     BONE MARROW BIOPSY  11/2014   CARDIAC CATHETERIZATION  12/27/2014   Procedure: INTRAVASCULAR PRESSURE WIRE/FFR STUDY;  Surgeon: Larey Dresser, MD;  Location: [facility] CATH LAB;  Service: Cardiovascular;;   CARDIAC CATHETERIZATION N/A 11/25/2016   Procedure: Left Heart Cath and Coronary Angiography;  Surgeon: Sherren Mocha, MD;  Location: [place] CV LAB;  Service: Cardiovascular;  Laterality: N/A;   CARDIOVERSION N/A 02/13/2016   Procedure: CARDIOVERSION;  Surgeon: Josue Hector, MD;  Location: [facility];  Service: Cardiovascular;  Laterality: N/A;   CARPAL TUNNEL RELEASE Right 1980's   CATARACT EXTRACTION Bilateral    CATARACT EXTRACTION W/ INTRAOCULAR LENS  IMPLANT, BILATERAL  2000's   CORONARY ANGIOPLASTY WITH STENT PLACEMENT  01/07/2015   "2"   CORONARY STENT PLACEMENT     DILATION AND CURETTAGE OF UTERUS     ESOPHAGOGASTRODUODENOSCOPY (EGD) WITH ESOPHAGEAL DILATION  "several times"   JOINT REPLACEMENT     KNEE ARTHROSCOPY Left 1995   LAPAROSCOPIC CHOLECYSTECTOMY  2003   LEFT HEART CATHETERIZATION WITH CORONARY ANGIOGRAM N/A 12/27/2014   Procedure: LEFT HEART CATHETERIZATION WITH CORONARY ANGIOGRAM;  Surgeon: Larey Dresser, MD;  Location: [facility] CATH LAB;  Service: Cardiovascular;  Laterality: N/A;   PERCUTANEOUS CORONARY ROTOBLATOR INTERVENTION (PCI-R)  01/07/2015   PERCUTANEOUS CORONARY ROTOBLATOR INTERVENTION (PCI-R) N/A 01/07/2015   Procedure: PERCUTANEOUS CORONARY ROTOBLATOR INTERVENTION (PCI-R);  Surgeon: Blane Ohara, MD;  Location: [facility] CATH LAB;  Service: Cardiovascular;  Laterality: N/A;   POSTERIOR FUSION THORACIC SPINE  08/2015   POSTERIOR LUMBAR FUSION  01/2015   TOTAL KNEE ARTHROPLASTY Bilateral 1990's - 2000's    Social History   Socioeconomic History   Marital status: Widowed    Spouse name: Not on file   Number of children: 2   Years of education: HS   Highest education level: Not on file  Occupational History    Occupation: Licensed conveyancer- retired    Comment: retired  Tobacco Use   Smoking status: Never   Smokeless tobacco: Never  Vaping Use   Vaping Use: Never used  Substance and Sexual Activity   Alcohol use: No    Comment: 01/07/2015 "glass of wine at Christmas, maybe"   Drug use: No   Sexual activity: Not Currently  Other Topics Concern   Not on file  Social History Narrative   Patient is right handed   Patient drinks 1-2 sodas daily.   patlient lives alone.   Social Determinants of Health   Financial Resource Strain: Not on file  Food Insecurity: Not on file  Transportation Needs: Not on file  Physical Activity: Not on file  Stress: Not on file  Social Connections: Not on file  Intimate  Partner Violence: Not on file   Family History  Problem Relation Age of Onset   Coronary artery disease Father    Peripheral vascular disease Father    Coronary artery disease Mother    Coronary artery disease Brother    Colon cancer Sister    Emphysema Sister    Sleep apnea Son    Colon cancer Sister        spread to her brain   Emphysema Brother    Dementia Neg Hx       VITAL SIGNS BP (!) 105/57   Pulse 74   Temp 97.8 F (36.6 C)   Resp 18   Ht 5' 6"  (1.676 m)   Wt 174 lb (78.9 kg)   SpO2 97%   BMI 28.08 kg/m   Outpatient Encounter Medications as of 05/25/2022  Medication Sig   albuterol (VENTOLIN HFA) 108 (90 Base) MCG/ACT inhaler Inhale 2 puffs into the lungs every 6 (six) hours as needed for wheezing or shortness of breath.   Amino Acids-Protein Hydrolys (PRO-STAT PO) Take 30 mLs by mouth daily. In tube   aspirin EC 81 MG tablet Take 81 mg by mouth daily. Swallow whole.   Balsam [place]-Castor Oil (VENELEX) OINT Apply 1 application. topically daily.   Cholecalciferol 25 MCG (1000 UT) tablet Place 1,000 Units into feeding tube daily. 9 am   clobetasol (TEMOVATE) 0.05 % external solution Apply 1 application. topically 2 (two) times a week. Monday and Friday   diclofenac Sodium  (VOLTAREN) 1 % GEL Apply 2 g topically 3 (three) times daily. to neck and shoulders for pain management   diltiazem (CARDIZEM) 120 MG tablet Take 120 mg by mouth every 8 (eight) hours.   diphenoxylate-atropine (LOMOTIL) 2.5-0.025 MG/5ML liquid Place 5 mLs into feeding tube 3 (three) times daily as needed for diarrhea or loose stools.   ELIQUIS 5 MG TABS tablet Take 1 tablet (5 mg total) by mouth 2 (two) times daily.   eszopiclone 3 MG TABS Take 1 tablet (3 mg total) by mouth at bedtime. Take immediately before bedtime   fentaNYL (DURAGESIC) 50 MCG/HR Place 1 patch onto the skin every 3 (three) days.   FLUoxetine (PROZAC) 20 MG/5ML solution Place 60 mg into feeding tube daily.   furosemide (LASIX) 40 MG tablet Place 40 mg into feeding tube daily. 9 am   HYDROcodone-acetaminophen (NORCO) 10-325 MG tablet Take 0.5 tablets by mouth every 6 (six) hours as needed.   LINZESS 145 MCG CAPS capsule Place 145 mcg into feeding tube daily as needed (constipation).   loperamide HCl (IMODIUM) 1 MG/7.5ML suspension Place 2 mg into feeding tube 4 (four) times daily as needed for diarrhea or loose stools.   meclizine (ANTIVERT) 25 MG tablet Place 25 mg into feeding tube 3 (three) times daily as needed for dizziness.   naloxone (NARCAN) nasal spray 4 mg/0.1 mL Special Instructions: As needed for depressed respirations less than 8/minute. Notify provider if given. If no effectiveness or improvement, contact 911. As Needed   nitroGLYCERIN (NITROSTAT) 0.4 MG SL tablet Place 1 tablet (0.4 mg total) under the tongue every 5 (five) minutes as needed for chest pain (up to 3 doses).   Nutritional Supplements (FEEDING SUPPLEMENT, JEVITY 1.2 CAL,) LIQD Place into feeding tube as directed. 50 cc/hr Every Shift; Day, Evening, Night   Omeprazole-Sodium Bicarbonate (KONVOMEP) 2-84 MG/ML SUSR Give 10 mLs by tube in the morning and at bedtime.   rOPINIRole (REQUIP) 0.5 MG tablet Place 0.5 mg into feeding tube  in the morning and at  bedtime.   rosuvastatin (CRESTOR) 10 MG tablet Place 10 mg into feeding tube daily. 9 am   No facility-administered encounter medications on file as of 05/25/2022.     SIGNIFICANT DIAGNOSTIC EXAMS  PREVIOUS   02-23-22: TEE:  The left ventricular size is normal.  Mild left ventricular hypertrophy  LV ejection fraction = 45-50%.  Left ventricular systolic function is mildly reduced.  The right ventricle is normal in size and function.  The left atrium is moderately to severely dilated.  The right atrium is mildly to moderately dilated.  Estimated right ventricular systolic pressure is 27 mmHg.  IVC size was normal.  There is no significant valvular stenosis or regurgitation.  There is no pericardial effusion.  There is a pleural effusion present.  Recommend a limited TTE when HR is controlled to reassess   TODAY  05-23-22: chest x-ray: mild congestive heart failure   LABS REVIEWED: PREVIOUS   02-27-22: glucose 102; bun 13; creat 0.52; k+ 4.8; na++ 134; ca 8.9; GFR>90 04-08-22: wbc 7.2 hgb 7.2; hct 11.9; mcv 35.2 plt 185; glucose 114; bun 11; creat 0.37; k+ 4.5; na++ 132; ca 8.6; GFR >90  TODAY  05-21-22: glucose 113; bun 19; creat 0.5; k+ 4.2; na++ 135; ca 8.5 gfr>60; protein 5.2; albumin 2.4  05-23-22: wbc 5.4; hgb 9.2; hct 992.6 plt 184; glucose 123; bun 21; creat 0.54; k+ 3.8; na++ 8.2; ca 8.2 gfr >60   Review of Systems  Constitutional:  Negative for fever and malaise/fatigue.  Respiratory:  Positive for cough, sputum production and shortness of breath.   Cardiovascular:  Positive for leg swelling. Negative for chest pain and palpitations.  Gastrointestinal:  Negative for abdominal pain, constipation and heartburn.  Musculoskeletal:  Negative for back pain, joint pain and myalgias.  Skin: Negative.   Neurological:  Negative for dizziness.  Psychiatric/Behavioral:  The patient is not nervous/anxious.    Physical Exam Constitutional:      General: She is not in acute  distress.    Appearance: She is well-developed. She is not diaphoretic.  Neck:     Thyroid: No thyromegaly.  Cardiovascular:     Rate and Rhythm: Normal rate and regular rhythm.     Pulses: Normal pulses.     Heart sounds: Normal heart sounds.  Pulmonary:     Effort: Pulmonary effort is normal. No respiratory distress.     Breath sounds: Wheezing and rhonchi present.  Abdominal:     General: Abdomen is flat. Bowel sounds are normal. There is no distension.     Palpations: Abdomen is soft.     Tenderness: There is no abdominal tenderness.     Comments: Peg tube present   Musculoskeletal:        General: Normal range of motion.     Cervical back: Neck supple.     Right lower leg: Edema present.     Left lower leg: Edema present.     Comments: 2+  Lymphadenopathy:     Cervical: No cervical adenopathy.  Skin:    General: Skin is warm and dry.  Neurological:     Mental Status: She is alert and oriented to person, place, and time.  Psychiatric:        Mood and Affect: Mood normal.     ASSESSMENT/ PLAN:  TODAY  HCAP (healthcare associated pneumonia) Chronic diastolic congestive heart failure  Will increase lasix to 40 mg twice daily  Will begin k+ 20 meq daily Will  begin augmentin 875 mg twice daily through 06-03-22 Will check bmp 06-01-22   Ok Edwards NP [facility]  call [phone]

## 2022-05-29 ENCOUNTER — Ambulatory Visit: Payer: Medicare HMO | Admitting: Family Medicine

## 2022-06-01 ENCOUNTER — Other Ambulatory Visit (HOSPITAL_COMMUNITY)
Admission: RE | Admit: 2022-06-01 | Discharge: 2022-06-01 | Disposition: A | Discharge status: Home / Self Care | Payer: Medicare HMO | Source: Skilled Nursing Facility | Attending: Adult Health | Admitting: Adult Health

## 2022-06-01 DIAGNOSIS — R051 Acute cough: Secondary | ICD-10-CM | POA: Diagnosis present

## 2022-06-01 LAB — BASIC METABOLIC PANEL
Anion gap: 4 — ABNORMAL LOW (ref 5–15)
BUN: 16 mg/dL (ref 8–23)
CO2: 28 mmol/L (ref 22–32)
Calcium: 8.6 mg/dL — ABNORMAL LOW (ref 8.9–10.3)
Chloride: 103 mmol/L (ref 98–111)
Creatinine, Ser: 0.44 mg/dL (ref 0.44–1.00)
GFR, Estimated: >60 mL/min (ref >60)
Glucose, Bld: 91 mg/dL (ref 70–99)
Potassium: 4.1 mmol/L (ref 3.5–5.1)
Sodium: 135 mmol/L (ref 135–145)

## 2022-06-03 ENCOUNTER — Other Ambulatory Visit (HOSPITAL_COMMUNITY)
Admission: RE | Admit: 2022-06-03 | Discharge: 2022-06-03 | Disposition: A | Discharge status: Home / Self Care | Payer: Medicare HMO | Source: Skilled Nursing Facility | Attending: Internal Medicine | Admitting: Internal Medicine

## 2022-06-03 DIAGNOSIS — Z20822 Contact with and (suspected) exposure to covid-19: Secondary | ICD-10-CM | POA: Insufficient documentation

## 2022-06-03 LAB — SARS CORONAVIRUS 2 BY RT PCR: SARS Coronavirus 2 by RT PCR: NEGATIVE

## 2022-06-05 ENCOUNTER — Non-Acute Institutional Stay (SKILLED_NURSING_FACILITY): Payer: Medicare HMO | Admitting: Internal Medicine

## 2022-06-05 ENCOUNTER — Encounter: Payer: Self-pay | Admitting: Internal Medicine

## 2022-06-05 DIAGNOSIS — K2100 Gastro-esophageal reflux disease with esophagitis, without bleeding: Secondary | ICD-10-CM

## 2022-06-05 DIAGNOSIS — D500 Iron deficiency anemia secondary to blood loss (chronic): Secondary | ICD-10-CM

## 2022-06-05 DIAGNOSIS — J690 Pneumonitis due to inhalation of food and vomit: Secondary | ICD-10-CM

## 2022-06-05 DIAGNOSIS — I10 Essential (primary) hypertension: Secondary | ICD-10-CM

## 2022-06-05 DIAGNOSIS — E43 Unspecified severe protein-calorie malnutrition: Secondary | ICD-10-CM | POA: Diagnosis not present

## 2022-06-05 NOTE — Patient Instructions (Signed)
See assessment and plan under each diagnosis in the problem list and acutely for this visit 

## 2022-06-05 NOTE — Assessment & Plan Note (Signed)
Current total protein is 5.2 and albumin 2.4 as of 6/1.  This is associated with peripheral edema as well as localized edema of the right hand.  Nutritionist continues to consult and monitor.

## 2022-06-05 NOTE — Assessment & Plan Note (Signed)
BP controlled; no change in antihypertensive medications  

## 2022-06-05 NOTE — Assessment & Plan Note (Signed)
Rhonchi present on the right suggesting chronic aspiration of secretions.  This is in the context of symptomatic reflux.  Her generic Protonix was not covered by insurance; Medicaid application in process which may allow regimen change.

## 2022-06-05 NOTE — Progress Notes (Unsigned)
NURSING HOME LOCATION: [facility] ROOM NUMBER:  159 P  CODE STATUS:  Full Code  PCP:  Ok Edwards NP,PSC  This is a nursing facility follow up visit of chronic medical diagnoses & to document compliance with Regulation 483.30 (c) in The [place] Manual Phase 2 which mandates caregiver visit ( visits can alternate among physician, PA or NP as per statutes) within 10 days of 30 days / 60 days/ 90 days post admission to SNF date    Interim medical record and care since last SNF visit was updated with review of diagnostic studies and change in clinical status since last visit were documented.  HPI: She is a permanent resident of this facility with medical diagnoses of iron deficiency anemia, Bergmann syndrome, CAD, chronic atrial fibrillation, chronic diastolic congestive heart failure, GERD, essential hypertension, dyslipidemia, IBS, OSA, monoclonal gammopathy of unknown significance, and history of stroke.  [facility]  Dr. Lincoln Maxin arranged a swallowing trial with ENT.  No further surgical intervention was recommended.  Labs are current and reveal CKD stage II with a creatinine of 0.44 and GFR greater than 60.  She has mild hypocalcemia with a value of 8.6; it previously was 8.2.  She has had progressive anemia.  On 3/28 H/H was 13/42.2; on 6/3 values were 9.2/30.1. Total protein 5.2 & albumin 2.4 on 6/1.  Review of systems: She understands that she now has 2 choices; 1 to come off her feeding tube and just die in the second to continue the present course.  She documents he has a swallowing study performed scheduled for 6/27.  She made the statement "I pray to God I pass".  She has been ingesting ice chips which she states "is wonderful"; she is not taking oral medications or p.o. nourishment.   She describes burning acid reflux since she has been off generic Protonix.  Apparently there was some question with insurance  coverage. She states that she has a cough which has been productive of yellow sputum but is now productive of white secretions. She describes chronic peripheral edema but no paroxysmal nocturnal dyspnea.  She recently has noted swelling of the dorsum of the right hand.  Constitutional: No fever, significant weight change, fatigue  Eyes: No redness, discharge, pain, vision change ENT/mouth: No nasal congestion,  purulent discharge, earache, change in hearing, sore throat  Cardiovascular: No chest pain, palpitations, paroxysmal nocturnal dyspnea, claudication, edema  Respiratory: No cough, sputum production, hemoptysis, DOE, significant snoring, apnea   Gastrointestinal: No heartburn, dysphagia, abdominal pain, nausea /vomiting, rectal bleeding, melena, change in bowels Genitourinary: No dysuria, hematuria, pyuria, incontinence, nocturia Musculoskeletal: No joint stiffness, joint swelling, weakness, pain Dermatologic: No rash, pruritus, change in appearance of skin Neurologic: No dizziness, headache, syncope, seizures, numbness, tingling Psychiatric: No significant anxiety, depression, insomnia, anorexia Endocrine: No change in hair/skin/nails, excessive thirst, excessive hunger, excessive urination  Hematologic/lymphatic: No significant bruising, lymphadenopathy, abnormal bleeding Allergy/immunology: No itchy/watery eyes, significant sneezing, urticaria, angioedema  Physical exam:  Pertinent or positive findings: She sitting up in a wheelchair.  Her facial character is 1 of possible amount or concern.  She did not remember having met me previously.  She is on nasal oxygen.  Breath sounds are decreased posteriorly; she has mild rhonchi greater on the right anteriorly.  She exhibits an intermittent rapidly but nonproductive cough.  Has an occasional premature beat but rhythm is otherwise regular.  Pedal pulses are decreased.  She  has 1/2+ edema at the sock line.  There is some edema of the right  hand.  There is no tenderness to palpation or percussion of the right hand.  There is no pain with making a fist.  There is no pain to compression of the forearm or upper General appearance: Adequately nourished; no acute distress, increased work of breathing is present.   Lymphatic: No lymphadenopathy about the head, neck, axilla. Eyes: No conjunctival inflammation or lid edema is present. There is no scleral icterus. Ears:  External ear exam shows no significant lesions or deformities.   Nose:  External nasal examination shows no deformity or inflammation. Nasal mucosa are pink and moist without lesions, exudates Oral exam:  Lips and gums are healthy appearing. There is no oropharyngeal erythema or exudate. Neck:  No thyromegaly, masses, tenderness noted.    Heart:  Normal rate and regular rhythm. S1 and S2 normal without gallop, murmur, click, rub .  Lungs: Chest clear to auscultation without wheezes, rhonchi, rales, rubs. Abdomen: Bowel sounds are normal. Abdomen is soft and nontender with no organomegaly, hernias, masses. GU: Deferred  Extremities:  No cyanosis, clubbing, edema  Neurologic exam : Cn 2-7 intact Strength equal  in upper & lower extremities Balance, Rhomberg, finger to nose testing could not be completed due to clinical state Deep tendon reflexes are equal Skin: Warm & dry w/o tenting. No significant lesions or rash.  See summary under each active problem in the Problem List with associated updated therapeutic plan

## 2022-06-05 NOTE — Assessment & Plan Note (Signed)
GERD is symptomatic with progression of symptoms off generic Protonix.  Trial of PPI if she passes a swallowing study on 6/27.

## 2022-06-06 ENCOUNTER — Encounter: Payer: Self-pay | Admitting: Hematology

## 2022-06-06 NOTE — Assessment & Plan Note (Addendum)
Current H/H 9.2/30.1, down from 13/42.2. No bleeding dyscrasias described , but most likely from chronic gastritis.On Eliquis prophylaxis. Continue H/H monitor & check FOB.

## 2022-06-09 ENCOUNTER — Other Ambulatory Visit: Payer: Self-pay | Admitting: Adult Health

## 2022-06-09 MED ORDER — ESZOPICLONE 3 MG PO TABS: 3.0000 mg | ORAL_TABLET | Freq: Every day | ORAL | 0 refills | Status: DC

## 2022-06-09 MED ORDER — FENTANYL 50 MCG/HR TD PT72: 1.0000 | MEDICATED_PATCH | TRANSDERMAL | 0 refills | Status: DC

## 2022-06-22 ENCOUNTER — Encounter: Payer: Self-pay | Admitting: Adult Health

## 2022-06-22 ENCOUNTER — Non-Acute Institutional Stay (SKILLED_NURSING_FACILITY): Payer: Medicare HMO | Admitting: Adult Health

## 2022-06-22 DIAGNOSIS — F339 Major depressive disorder, recurrent, unspecified: Secondary | ICD-10-CM | POA: Diagnosis not present

## 2022-06-22 NOTE — Progress Notes (Unsigned)
Location:  [facility] Room Number: 159/P Place of Service:  SNF (31)   CODE STATUS: Full code   Allergies  Allergen Reactions   Buprenorphine Hcl Itching   Cymbalta [Duloxetine Hcl] Other (See Comments)    Other reaction(s): Other (See Comments) Personality changes - crying  2014   Morphine And Related Itching   Oxycodone-Acetaminophen Other (See Comments)    Personality changes    Valium [Diazepam] Other (See Comments)    Personality changes - "in another world" ;  Hallucinations  2015   Statins Other (See Comments)    Other reaction(s): Other (See Comments) Muscle weakness Muscle weakness   Morphine Itching   Altace [Ramipril] Other (See Comments)    unknown   Floxin [Ofloxacin] Other (See Comments)    unknown   Hydromorphone Other (See Comments)    Other reaction(s): Confusion (intolerance) unknown   Lovaza [Omega-3-Acid Ethyl Esters] Other (See Comments)    Muscle weakness    Trilipix [Choline Fenofibrate] Other (See Comments)    Muscle weakness    Zetia [Ezetimibe] Other (See Comments)    Muscle weakness     Chief Complaint  Patient presents with   Acute Visit    Crying     HPI:  Staff report that over the past couple of days she has had increased crying present. She states that she is more anxious and is upset that she is not going back home. She states that she is not resting well at night.   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 Abnl nuc 11/2014. Cath 12/2014 - turned down for CABG. Ultimately s/p TTVP, rotational atherectomy, PTCA and stenting of the ostial LCx and left main into the LAD (crush technique), and IVUS of the LAD/Left main. // b. Myoview 11/17: EF 48, poor quality/significant artifact;  inf-lateral, inferior ischemia; Intermediate Risk   Chronic atrial fibrillation ([place])    a.  First noted post-op 9/15 spinal fusion.  She had cardioversion, not on anticoagulation. Fall risk, unsteady. // failed DCCV // Holter 10/17: AFib, Avg HR 97, PVCs, no other arrhythmia   Chronic back pain greater than 3 months duration    a. spinal stenosis.  Spinal fusion with rods in 2/15 at [facility] spinal fusion 9/15.   Chronic diastolic CHF    Echo 7/61:  EF 50-55%, trivial AI, midl MR, mod LAE, PASP 37 mmHg // Echocardiogram 03/2020: EF 45-50, no RWMA, mild LVH, mild reduced RVSF, mildly elevated PASP (RVSP 38.3), mild LAE, mild MR, mild to mod TR, trivial AI // Echo 9/21: EF 45, mild LVH, mildly reduced RV SF, moderate LAE, mild RAE, mild MR, mild AI      Circadian rhythm sleep disorder    CTS (carpal tunnel syndrome)    Deficiency anemia 11/10/2014   Depression    Diarrhea    Diverticulitis of colon    Esophageal stricture    Gastritis    Gastroesophageal hernia 07/06/2013   GERD (gastroesophageal reflux disease)    Hiatal hernia    History of blood transfusion    "most of them related to OR's"    HTN (hypertension)    Hyperlipidemia    IBS (irritable bowel syndrome)    Incontinence 10/13/2012  Insomnia    Ischemic chest pain ([place])    Memory disorder 46/50/3546   Metabolic syndrome 56/81/2751   MGUS (monoclonal gammopathy of unknown significance) dx'd 11/2014   a. Neg BMB 11/2014.   Multiple falls    Obesity    Obstructive sleep apnea    "have mask; don't wear it" (01/07/2015)   Orthostasis    PAT (paroxysmal atrial tachycardia) ([place])    Personal history of colonic polyps 10/25/2011 & 12/02/11   not retrieved Dr Lyla Son & tubular adenomas   Pneumonia 03/2014   Sinus bradycardia    a. Baseline HR 50s-60s.   Stroke [facility]) early 2000's   "small"; denies residual on 01/07/2015)    Past Surgical History:  Procedure Laterality Date   BACK SURGERY     BONE MARROW  BIOPSY  11/2014   CARDIAC CATHETERIZATION  12/27/2014   Procedure: INTRAVASCULAR PRESSURE WIRE/FFR STUDY;  Surgeon: Larey Dresser, MD;  Location: [facility] CATH LAB;  Service: Cardiovascular;;   CARDIAC CATHETERIZATION N/A 11/25/2016   Procedure: Left Heart Cath and Coronary Angiography;  Surgeon: Sherren Mocha, MD;  Location: [place] CV LAB;  Service: Cardiovascular;  Laterality: N/A;   CARDIOVERSION N/A 02/13/2016   Procedure: CARDIOVERSION;  Surgeon: Josue Hector, MD;  Location: [facility];  Service: Cardiovascular;  Laterality: N/A;   CARPAL TUNNEL RELEASE Right 1980's   CATARACT EXTRACTION Bilateral    CATARACT EXTRACTION W/ INTRAOCULAR LENS  IMPLANT, BILATERAL  2000's   CORONARY ANGIOPLASTY WITH STENT PLACEMENT  01/07/2015   "2"   CORONARY STENT PLACEMENT     DILATION AND CURETTAGE OF UTERUS     ESOPHAGOGASTRODUODENOSCOPY (EGD) WITH ESOPHAGEAL DILATION  "several times"   JOINT REPLACEMENT     KNEE ARTHROSCOPY Left 1995   LAPAROSCOPIC CHOLECYSTECTOMY  2003   LEFT HEART CATHETERIZATION WITH CORONARY ANGIOGRAM N/A 12/27/2014   Procedure: LEFT HEART CATHETERIZATION WITH CORONARY ANGIOGRAM;  Surgeon: Larey Dresser, MD;  Location: [facility] CATH LAB;  Service: Cardiovascular;  Laterality: N/A;   PERCUTANEOUS CORONARY ROTOBLATOR INTERVENTION (PCI-R)  01/07/2015   PERCUTANEOUS CORONARY ROTOBLATOR INTERVENTION (PCI-R) N/A 01/07/2015   Procedure: PERCUTANEOUS CORONARY ROTOBLATOR INTERVENTION (PCI-R);  Surgeon: Blane Ohara, MD;  Location: [facility] CATH LAB;  Service: Cardiovascular;  Laterality: N/A;   POSTERIOR FUSION THORACIC SPINE  08/2015   POSTERIOR LUMBAR FUSION  01/2015   TOTAL KNEE ARTHROPLASTY Bilateral 1990's - 2000's    Social History   Socioeconomic History   Marital status: Widowed    Spouse name: Not on file   Number of children: 2   Years of education: HS   Highest education level: Not on file  Occupational History   Occupation: Licensed conveyancer- retired    Comment: retired  Tobacco Use    Smoking status: Never   Smokeless tobacco: Never  Vaping Use   Vaping Use: Never used  Substance and Sexual Activity   Alcohol use: No    Comment: 01/07/2015 "glass of wine at Christmas, maybe"   Drug use: No   Sexual activity: Not Currently  Other Topics Concern   Not on file  Social History Narrative   Patient is right handed   Patient drinks 1-2 sodas daily.   patlient lives alone.   Social Determinants of Health   Financial Resource Strain: Not on file  Food Insecurity: Not on file  Transportation Needs: Not on file  Physical Activity: Inactive (04/04/2020)   Exercise Vital Sign    Days of Exercise per Week: 0 days  Minutes of Exercise per Session: 0 min  Stress: Stress Concern Present (04/04/2020)   [place]    Feeling of Stress : To some extent  Social Connections: Socially Isolated (04/04/2020)   Social Connection and Isolation Panel [NHANES]    Frequency of Communication with Friends and Family: Three times a week    Frequency of Social Gatherings with Friends and Family: Once a week    Attends Religious Services: Never    Marine scientist or Organizations: No    Attends Archivist Meetings: Never    Marital Status: Widowed  Human resources officer Violence: Not on file   Family History  Problem Relation Age of Onset   Coronary artery disease Father    Peripheral vascular disease Father    Coronary artery disease Mother    Coronary artery disease Brother    Colon cancer Sister    Emphysema Sister    Sleep apnea Son    Colon cancer Sister        spread to her brain   Emphysema Brother    Dementia Neg Hx       VITAL SIGNS BP 135/71   Pulse 84   Temp (!) 97.5 F (36.4 C)   Resp 20   Ht '5\' 6"'$  (1.676 m)   Wt 174 lb (78.9 kg)   SpO2 95%   BMI 28.08 kg/m   Outpatient Encounter Medications as of 06/22/2022  Medication Sig   acetaminophen (TYLENOL) 650 MG CR tablet Take 650  mg by mouth every 8 (eight) hours as needed (Pain management).   albuterol (VENTOLIN HFA) 108 (90 Base) MCG/ACT inhaler Inhale 2 puffs into the lungs every 6 (six) hours as needed for wheezing or shortness of breath.   Amino Acids-Protein Hydrolys (PRO-STAT PO) Take 30 mLs by mouth 3 (three) times daily. In tube   aspirin EC 81 MG tablet Take 81 mg by mouth daily. Swallow whole.   Balsam [place]-Castor Oil (VENELEX) OINT Apply 1 application. topically daily.   busPIRone (BUSPAR) 5 MG tablet Take 5 mg by mouth 2 (two) times daily.   Cholecalciferol 25 MCG (1000 UT) tablet Place 1,000 Units into feeding tube daily. 9 am   clobetasol (TEMOVATE) 0.05 % external solution Apply 1 application. topically 2 (two) times a week. Monday and Friday   diclofenac Sodium (VOLTAREN) 1 % GEL Apply 2 g topically 3 (three) times daily. to neck and shoulders for pain management   diltiazem (CARDIZEM) 120 MG tablet Take 120 mg by mouth every 8 (eight) hours.   diphenoxylate-atropine (LOMOTIL) 2.5-0.025 MG/5ML liquid Place 5 mLs into feeding tube 3 (three) times daily as needed for diarrhea or loose stools.   ELIQUIS 5 MG TABS tablet Take 1 tablet (5 mg total) by mouth 2 (two) times daily.   eszopiclone 3 MG TABS Take 1 tablet (3 mg total) by mouth at bedtime. Take immediately before bedtime   fentaNYL (DURAGESIC) 50 MCG/HR Place 1 patch onto the skin every 3 (three) days.   FLUoxetine (PROZAC) 20 MG/5ML solution Place 60 mg into feeding tube daily.   furosemide (LASIX) 40 MG tablet Place 40 mg into feeding tube 2 (two) times daily. 9 am   LINZESS 145 MCG CAPS capsule Place 145 mcg into feeding tube daily as needed (constipation).   loperamide HCl (IMODIUM) 1 MG/7.5ML suspension Place 2 mg into feeding tube 4 (four) times daily as needed for diarrhea or loose stools.   meclizine (  ANTIVERT) 25 MG tablet Place 25 mg into feeding tube 3 (three) times daily as needed for dizziness.   naloxone (NARCAN) nasal spray 4 mg/0.1 mL  Special Instructions: As needed for depressed respirations less than 8/minute. Notify provider if given. If no effectiveness or improvement, contact 911. As Needed   nitroGLYCERIN (NITROSTAT) 0.4 MG SL tablet Place 1 tablet (0.4 mg total) under the tongue every 5 (five) minutes as needed for chest pain (up to 3 doses).   Nutritional Supplements (FEEDING SUPPLEMENT, JEVITY 1.2 CAL,) LIQD Place into feeding tube as directed. 50 cc/hr Every Shift; Day, Evening, Night   Omeprazole-Sodium Bicarbonate (KONVOMEP) 2-84 MG/ML SUSR Give 10 mLs by tube in the morning and at bedtime.   potassium chloride 20 MEQ/15ML (10%) SOLN Take 20 mEq by mouth daily. For potassium supplement   rOPINIRole (REQUIP) 0.5 MG tablet Place 0.5 mg into feeding tube in the morning and at bedtime.   rosuvastatin (CRESTOR) 10 MG tablet Place 10 mg into feeding tube daily. 9 am   No facility-administered encounter medications on file as of 06/22/2022.     SIGNIFICANT DIAGNOSTIC EXAMS   PREVIOUS   02-23-22: TEE:  The left ventricular size is normal.  Mild left ventricular hypertrophy  LV ejection fraction = 45-50%.  Left ventricular systolic function is mildly reduced.  The right ventricle is normal in size and function.  The left atrium is moderately to severely dilated.  The right atrium is mildly to moderately dilated.  Estimated right ventricular systolic pressure is 27 mmHg.  IVC size was normal.  There is no significant valvular stenosis or regurgitation.  There is no pericardial effusion.  There is a pleural effusion present.  Recommend a limited TTE when HR is controlled to reassess   05-23-22: chest x-ray: mild congestive heart failure   NO NEW EXAMS.   LABS REVIEWED: PREVIOUS   02-27-22: glucose 102; bun 13; creat 0.52; k+ 4.8; na++ 134; ca 8.9; GFR>90 04-08-22: wbc 7.2 hgb 7.2; hct 11.9; mcv 35.2 plt 185; glucose 114; bun 11; creat 0.37; k+ 4.5; na++ 132; ca 8.6; GFR >90 05-21-22: glucose 113; bun 19; creat  0.5; k+ 4.2; na++ 135; ca 8.5 gfr>60; protein 5.2; albumin 2.4  05-23-22: wbc 5.4; hgb 9.2; hct 992.6 plt 184; glucose 123; bun 21; creat 0.54; k+ 3.8; na++ 8.2; ca 8.2 gfr >60  NO NEW LABS.   Review of Systems  Constitutional:  Negative for malaise/fatigue.  Respiratory:  Negative for cough and shortness of breath.   Cardiovascular:  Negative for chest pain, palpitations and leg swelling.  Gastrointestinal:  Negative for abdominal pain, constipation and heartburn.  Musculoskeletal:  Negative for back pain, joint pain and myalgias.  Skin: Negative.   Neurological:  Negative for dizziness.  Psychiatric/Behavioral:  Positive for depression. The patient is nervous/anxious and has insomnia.    Physical Exam Constitutional:      General: She is not in acute distress.    Appearance: She is well-developed. She is not diaphoretic.  Neck:     Thyroid: No thyromegaly.  Cardiovascular:     Rate and Rhythm: Normal rate and regular rhythm.     Pulses: Normal pulses.     Heart sounds: Normal heart sounds.  Pulmonary:     Effort: Pulmonary effort is normal. No respiratory distress.     Breath sounds: Normal breath sounds.  Abdominal:     General: Bowel sounds are normal. There is no distension.     Palpations: Abdomen is soft.  Tenderness: There is no abdominal tenderness.     Comments: Peg tube present   Musculoskeletal:        General: Normal range of motion.     Cervical back: Neck supple.     Right lower leg: Edema present.     Left lower leg: Edema present.     Comments: 1-2+  Lymphadenopathy:     Cervical: No cervical adenopathy.  Skin:    General: Skin is warm and dry.  Neurological:     Mental Status: She is alert and oriented to person, place, and time.  Psychiatric:        Mood and Affect: Mood normal.       ASSESSMENT/ PLAN:  TODAY  Major depression recurrent chronic: is on prozac 60 mg daily will begin buspar 5 mg twice daily for her anxiety and will monitor her  status.    Ok Edwards NP [facility]   call [phone]

## 2022-07-09 ENCOUNTER — Other Ambulatory Visit: Payer: Self-pay | Admitting: Adult Health

## 2022-07-09 MED ORDER — FENTANYL 50 MCG/HR TD PT72: 1.0000 | MEDICATED_PATCH | TRANSDERMAL | 0 refills | Status: DC

## 2022-07-09 MED ORDER — ESZOPICLONE 3 MG PO TABS: 3.0000 mg | ORAL_TABLET | Freq: Every day | ORAL | 0 refills | Status: DC

## 2022-07-14 ENCOUNTER — Non-Acute Institutional Stay (SKILLED_NURSING_FACILITY): Payer: Medicare HMO | Admitting: Adult Health

## 2022-07-14 ENCOUNTER — Encounter: Payer: Self-pay | Admitting: Adult Health

## 2022-07-14 ENCOUNTER — Other Ambulatory Visit (HOSPITAL_COMMUNITY)
Admission: RE | Admit: 2022-07-14 | Discharge: 2022-07-14 | Disposition: A | Discharge status: Home / Self Care | Payer: Medicare HMO | Source: Skilled Nursing Facility | Attending: Internal Medicine | Admitting: Internal Medicine

## 2022-07-14 DIAGNOSIS — Z0001 Encounter for general adult medical examination with abnormal findings: Secondary | ICD-10-CM | POA: Insufficient documentation

## 2022-07-14 DIAGNOSIS — R000 Tachycardia, unspecified: Secondary | ICD-10-CM | POA: Insufficient documentation

## 2022-07-14 DIAGNOSIS — R300 Dysuria: Secondary | ICD-10-CM | POA: Insufficient documentation

## 2022-07-14 DIAGNOSIS — J189 Pneumonia, unspecified organism: Secondary | ICD-10-CM | POA: Diagnosis not present

## 2022-07-14 DIAGNOSIS — J690 Pneumonitis due to inhalation of food and vomit: Secondary | ICD-10-CM | POA: Diagnosis not present

## 2022-07-14 DIAGNOSIS — R509 Fever, unspecified: Secondary | ICD-10-CM | POA: Diagnosis not present

## 2022-07-14 LAB — CBC
HCT: 35.3 % — ABNORMAL LOW (ref 36.0–46.0)
Hemoglobin: 10.8 g/dL — ABNORMAL LOW (ref 12.0–15.0)
MCH: 25.7 pg — ABNORMAL LOW (ref 26.0–34.0)
MCHC: 30.6 g/dL (ref 30.0–36.0)
MCV: 83.8 fL (ref 80.0–100.0)
Platelets: 283 10*3/uL (ref 150–400)
RBC: 4.21 MIL/uL (ref 3.87–5.11)
RDW: 15.4 % (ref 11.5–15.5)
WBC: 9.9 10*3/uL (ref 4.0–10.5)
nRBC: 0 % (ref 0.0–0.2)

## 2022-07-14 LAB — BASIC METABOLIC PANEL
Anion gap: 7 (ref 5–15)
BUN: 24 mg/dL — ABNORMAL HIGH (ref 8–23)
CO2: 28 mmol/L (ref 22–32)
Calcium: 8.6 mg/dL — ABNORMAL LOW (ref 8.9–10.3)
Chloride: 98 mmol/L (ref 98–111)
Creatinine, Ser: 0.52 mg/dL (ref 0.44–1.00)
GFR, Estimated: >60 mL/min (ref >60)
Glucose, Bld: 168 mg/dL — ABNORMAL HIGH (ref 70–99)
Potassium: 4.2 mmol/L (ref 3.5–5.1)
Sodium: 133 mmol/L — ABNORMAL LOW (ref 135–145)

## 2022-07-14 LAB — URINALYSIS, ROUTINE W REFLEX MICROSCOPIC
Bilirubin Urine: NEGATIVE
Glucose, UA: NEGATIVE mg/dL
Ketones, ur: NEGATIVE mg/dL
Nitrite: POSITIVE — AB
Protein, ur: 30 mg/dL — AB
Specific Gravity, Urine: 1.012 (ref 1.005–1.030)
pH: 8 (ref 5.0–8.0)

## 2022-07-14 NOTE — Progress Notes (Signed)
Location:  [place] Room Number: 159-P Place of Service:  SNF (31)   CODE STATUS: Full Code   Allergies  Allergen Reactions   Buprenorphine Hcl Itching   Cymbalta [Duloxetine Hcl] Other (See Comments)    Other reaction(s): Other (See Comments) Personality changes - crying  2014   Morphine And Related Itching   Oxycodone-Acetaminophen Other (See Comments)    Personality changes    Valium [Diazepam] Other (See Comments)    Personality changes - "in another world" ;  Hallucinations  2015   Statins Other (See Comments)    Other reaction(s): Other (See Comments) Muscle weakness Muscle weakness   Morphine Itching   Altace [Ramipril] Other (See Comments)    unknown   Floxin [Ofloxacin] Other (See Comments)    unknown   Hydromorphone Other (See Comments)    Other reaction(s): Confusion (intolerance) unknown   Lovaza [Omega-3-Acid Ethyl Esters] Other (See Comments)    Muscle weakness    Trilipix [Choline Fenofibrate] Other (See Comments)    Muscle weakness    Zetia [Ezetimibe] Other (See Comments)    Muscle weakness     Chief Complaint  Patient presents with   Acute Visit    Fever    HPI:  Staff report that she has a fever of 99.8; and has risen to 100.5. her 02 sat is 86% on room air. She is coughing no sputum . She is short of breath. There has been no reports of aspiration present.  She has been taking ice chips and fluids by mouth. She is covid negative; is tachycardic and irregular. Does have a history of atrial fibrillation.   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 Abnl nuc 11/2014. Cath 12/2014 - turned down for CABG. Ultimately s/p TTVP, rotational atherectomy, PTCA and stenting of the ostial LCx and left main into  the LAD (crush technique), and IVUS of the LAD/Left main. // b. Myoview 11/17: EF 48, poor quality/significant artifact; inf-lateral, inferior ischemia; Intermediate Risk   Chronic atrial fibrillation ([place])    a.  First noted post-op 9/15 spinal fusion.  She had cardioversion, not on anticoagulation. Fall risk, unsteady. // failed DCCV // Holter 10/17: AFib, Avg HR 97, PVCs, no other arrhythmia   Chronic back pain greater than 3 months duration    a. spinal stenosis.  Spinal fusion with rods in 2/15 at [facility] spinal fusion 9/15.   Chronic diastolic CHF    Echo 9/73:  EF 50-55%, trivial AI, midl MR, mod LAE, PASP 37 mmHg // Echocardiogram 03/2020: EF 45-50, no RWMA, mild LVH, mild reduced RVSF, mildly elevated PASP (RVSP 38.3), mild LAE, mild MR, mild to mod TR, trivial AI // Echo 9/21: EF 45, mild LVH, mildly reduced RV SF, moderate LAE, mild RAE, mild MR, mild AI      Circadian rhythm sleep disorder    CTS (carpal tunnel syndrome)    Deficiency anemia 11/10/2014   Depression    Diarrhea    Diverticulitis of colon    Esophageal stricture    Gastritis    Gastroesophageal hernia 07/06/2013   GERD (gastroesophageal reflux disease)    Hiatal hernia    History of blood transfusion    "most of them related  to OR's"    HTN (hypertension)    Hyperlipidemia    IBS (irritable bowel syndrome)    Incontinence 10/13/2012   Insomnia    Ischemic chest pain ([place])    Memory disorder 69/62/9528   Metabolic syndrome 41/32/4401   MGUS (monoclonal gammopathy of unknown significance) dx'd 11/2014   a. Neg BMB 11/2014.   Multiple falls    Obesity    Obstructive sleep apnea    "have mask; don't wear it" (01/07/2015)   Orthostasis    PAT (paroxysmal atrial tachycardia) ([place])    Personal history of colonic polyps 10/25/2011 & 12/02/11   not retrieved Dr Lyla Son & tubular adenomas   Pneumonia 03/2014   Sinus bradycardia    a. Baseline HR 50s-60s.   Stroke [facility]) early 2000's   "small";  denies residual on 01/07/2015)    Past Surgical History:  Procedure Laterality Date   BACK SURGERY     BONE MARROW BIOPSY  11/2014   CARDIAC CATHETERIZATION  12/27/2014   Procedure: INTRAVASCULAR PRESSURE WIRE/FFR STUDY;  Surgeon: Larey Dresser, MD;  Location: [facility] CATH LAB;  Service: Cardiovascular;;   CARDIAC CATHETERIZATION N/A 11/25/2016   Procedure: Left Heart Cath and Coronary Angiography;  Surgeon: Sherren Mocha, MD;  Location: [place] CV LAB;  Service: Cardiovascular;  Laterality: N/A;   CARDIOVERSION N/A 02/13/2016   Procedure: CARDIOVERSION;  Surgeon: Josue Hector, MD;  Location: [place];  Service: Cardiovascular;  Laterality: N/A;   CARPAL TUNNEL RELEASE Right 1980's   CATARACT EXTRACTION Bilateral    CATARACT EXTRACTION W/ INTRAOCULAR LENS  IMPLANT, BILATERAL  2000's   CORONARY ANGIOPLASTY WITH STENT PLACEMENT  01/07/2015   "2"   CORONARY STENT PLACEMENT     DILATION AND CURETTAGE OF UTERUS     ESOPHAGOGASTRODUODENOSCOPY (EGD) WITH ESOPHAGEAL DILATION  "several times"   JOINT REPLACEMENT     KNEE ARTHROSCOPY Left 1995   LAPAROSCOPIC CHOLECYSTECTOMY  2003   LEFT HEART CATHETERIZATION WITH CORONARY ANGIOGRAM N/A 12/27/2014   Procedure: LEFT HEART CATHETERIZATION WITH CORONARY ANGIOGRAM;  Surgeon: Larey Dresser, MD;  Location: [facility] CATH LAB;  Service: Cardiovascular;  Laterality: N/A;   PERCUTANEOUS CORONARY ROTOBLATOR INTERVENTION (PCI-R)  01/07/2015   PERCUTANEOUS CORONARY ROTOBLATOR INTERVENTION (PCI-R) N/A 01/07/2015   Procedure: PERCUTANEOUS CORONARY ROTOBLATOR INTERVENTION (PCI-R);  Surgeon: Blane Ohara, MD;  Location: [facility] CATH LAB;  Service: Cardiovascular;  Laterality: N/A;   POSTERIOR FUSION THORACIC SPINE  08/2015   POSTERIOR LUMBAR FUSION  01/2015   TOTAL KNEE ARTHROPLASTY Bilateral 1990's - 2000's    Social History   Socioeconomic History   Marital status: Widowed    Spouse name: Not on file   Number of children: 2   Years of education: HS   Highest  education level: Not on file  Occupational History   Occupation: Licensed conveyancer- retired    Comment: retired  Tobacco Use   Smoking status: Never   Smokeless tobacco: Never  Vaping Use   Vaping Use: Never used  Substance and Sexual Activity   Alcohol use: No    Comment: 01/07/2015 "glass of wine at Christmas, maybe"   Drug use: No   Sexual activity: Not Currently  Other Topics Concern   Not on file  Social History Narrative   Patient is right handed   Patient drinks 1-2 sodas daily.   patlient lives alone.   Social Determinants of Health   Financial Resource Strain: Not on file  Food Insecurity: Not on file  Transportation Needs: Not  on file  Physical Activity: Inactive (04/04/2020)   Exercise Vital Sign    Days of Exercise per Week: 0 days    Minutes of Exercise per Session: 0 min  Stress: Stress Concern Present (04/04/2020)   [place]    Feeling of Stress : To some extent  Social Connections: Socially Isolated (04/04/2020)   Social Connection and Isolation Panel [NHANES]    Frequency of Communication with Friends and Family: Three times a week    Frequency of Social Gatherings with Friends and Family: Once a week    Attends Religious Services: Never    Marine scientist or Organizations: No    Attends Archivist Meetings: Never    Marital Status: Widowed  Human resources officer Violence: Not on file   Family History  Problem Relation Age of Onset   Coronary artery disease Father    Peripheral vascular disease Father    Coronary artery disease Mother    Coronary artery disease Brother    Colon cancer Sister    Emphysema Sister    Sleep apnea Son    Colon cancer Sister        spread to her brain   Emphysema Brother    Dementia Neg Hx       VITAL SIGNS BP (!) 141/95   Pulse (!) 136   Temp 99.6 F (37.6 C)   Ht 5' 6"  (1.676 m)   Wt 174 lb (78.9 kg)   SpO2 92%   BMI 28.08 kg/m    Outpatient Encounter Medications as of 07/14/2022  Medication Sig   acetaminophen (TYLENOL) 650 MG CR tablet Take 650 mg by mouth every 8 (eight) hours as needed (Pain management).   albuterol (VENTOLIN HFA) 108 (90 Base) MCG/ACT inhaler Inhale 2 puffs into the lungs every 6 (six) hours as needed for wheezing or shortness of breath.   Amino Acids-Protein Hydrolys (PRO-STAT PO) Take 30 mLs by mouth 3 (three) times daily. In tube   aspirin EC 81 MG tablet Take 81 mg by mouth daily. Swallow whole.   Balsam [place]-Castor Oil (VENELEX) OINT Apply 1 application. topically daily.   busPIRone (BUSPAR) 5 MG tablet Take 5 mg by mouth 2 (two) times daily.   Cholecalciferol 25 MCG (1000 UT) tablet Place 1,000 Units into feeding tube daily. 9 am   clobetasol (TEMOVATE) 0.05 % external solution Apply 1 application. topically 2 (two) times a week. Monday and Friday   diclofenac Sodium (VOLTAREN) 1 % GEL Apply 2 g topically 3 (three) times daily. to neck and shoulders for pain management   diltiazem (CARDIZEM) 120 MG tablet Take 120 mg by mouth every 8 (eight) hours.   diphenoxylate-atropine (LOMOTIL) 2.5-0.025 MG/5ML liquid Place 5 mLs into feeding tube 3 (three) times daily as needed for diarrhea or loose stools.   ELIQUIS 5 MG TABS tablet Take 1 tablet (5 mg total) by mouth 2 (two) times daily.   eszopiclone 3 MG TABS Take 1 tablet (3 mg total) by mouth at bedtime. Take immediately before bedtime   fentaNYL (DURAGESIC) 50 MCG/HR Place 1 patch onto the skin every 3 (three) days.   FLUoxetine (PROZAC) 20 MG/5ML solution Place 60 mg into feeding tube daily.   furosemide (LASIX) 40 MG tablet Place 40 mg into feeding tube 2 (two) times daily. 9 am   ipratropium-albuterol (DUONEB) 0.5-2.5 (3) MG/3ML SOLN Take 3 mLs by nebulization every 6 (six) hours as needed.   LINZESS 145  MCG CAPS capsule Place 145 mcg into feeding tube daily as needed (constipation).   loperamide HCl (IMODIUM) 1 MG/7.5ML suspension Place 2 mg  into feeding tube 4 (four) times daily as needed for diarrhea or loose stools.   meclizine (ANTIVERT) 25 MG tablet Place 25 mg into feeding tube 3 (three) times daily as needed for dizziness.   naloxone (NARCAN) nasal spray 4 mg/0.1 mL Special Instructions: As needed for depressed respirations less than 8/minute. Notify provider if given. If no effectiveness or improvement, contact 911. As Needed   nitroGLYCERIN (NITROSTAT) 0.4 MG SL tablet Place 1 tablet (0.4 mg total) under the tongue every 5 (five) minutes as needed for chest pain (up to 3 doses).   Omeprazole-Sodium Bicarbonate (KONVOMEP) 2-84 MG/ML SUSR Give 10 mLs by tube in the morning and at bedtime.   potassium chloride 20 MEQ/15ML (10%) SOLN Take 20 mEq by mouth daily. For potassium supplement   rOPINIRole (REQUIP) 0.5 MG tablet Place 0.5 mg into feeding tube in the morning and at bedtime.   rosuvastatin (CRESTOR) 10 MG tablet Place 10 mg into feeding tube daily. 9 am   Nutritional Supplements (FEEDING SUPPLEMENT, JEVITY 1.2 CAL,) LIQD Place into feeding tube as directed. 50 cc/hr Every Shift; Day, Evening, Night   No facility-administered encounter medications on file as of 07/14/2022.     SIGNIFICANT DIAGNOSTIC EXAMS    PREVIOUS   02-23-22: TEE:  The left ventricular size is normal.  Mild left ventricular hypertrophy  LV ejection fraction = 45-50%.  Left ventricular systolic function is mildly reduced.  The right ventricle is normal in size and function.  The left atrium is moderately to severely dilated.  The right atrium is mildly to moderately dilated.  Estimated right ventricular systolic pressure is 27 mmHg.  IVC size was normal.  There is no significant valvular stenosis or regurgitation.  There is no pericardial effusion.  There is a pleural effusion present.  Recommend a limited TTE when HR is controlled to reassess   05-23-22: chest x-ray: mild congestive heart failure  07-14-22: chest x-ray:  Changes of  bibasilar atelectasis  Nonspecific perihilar inflammatory changes Bilateral spinal fusion hardware   NO NEW EXAMS.   LABS REVIEWED: PREVIOUS   02-27-22: glucose 102; bun 13; creat 0.52; k+ 4.8; na++ 134; ca 8.9; GFR>90 04-08-22: wbc 7.2 hgb 7.2; hct 11.9; mcv 35.2 plt 185; glucose 114; bun 11; creat 0.37; k+ 4.5; na++ 132; ca 8.6; GFR >90 05-21-22: glucose 113; bun 19; creat 0.5; k+ 4.2; na++ 135; ca 8.5 gfr>60; protein 5.2; albumin 2.4  05-23-22: wbc 5.4; hgb 9.2; hct 992.6 plt 184; glucose 123; bun 21; creat 0.54; k+ 3.8; na++ 8.2; ca 8.2 gfr >60  TODAY;   06-01-22: glucose 91; bun 16; creat 0.44; k+ 4.1; na++ 135; ca 8.6; gfr>60 06-14-22: wbc 9.9; hgb 10.8; hct 35.3; mcv 83.8 plt 283; glucose 168; bun 24; creat 0.52; k+ 4.2; na++ 133; ca 8.6 gfr >60   Review of Systems  Unable to perform ROS: Medical condition   Physical Exam Constitutional:      General: She is not in acute distress.    Appearance: She is well-developed. She is not diaphoretic.  Neck:     Thyroid: No thyromegaly.  Cardiovascular:     Rate and Rhythm: Normal rate and regular rhythm.     Pulses: Normal pulses.     Heart sounds: Normal heart sounds.  Pulmonary:     Effort: Pulmonary effort is normal. No respiratory distress.  Breath sounds: Rhonchi and rales present.  Abdominal:     General: Bowel sounds are normal. There is no distension.     Palpations: Abdomen is soft.     Tenderness: There is no abdominal tenderness.     Comments: Peg tube present   Musculoskeletal:        General: Normal range of motion.     Cervical back: Neck supple.     Right lower leg: Edema present.     Left lower leg: Edema present.  Lymphadenopathy:     Cervical: No cervical adenopathy.  Skin:    General: Skin is warm and dry.  Neurological:     Mental Status: She is alert.       ASSESSMENT/ PLAN:  TODAY  Aspiration pneumonia due to gastric secretions unspecified laterality unspecified part of lung Health care  associated pneumonia  Will begin augmentin 875 mg twice daily through 07-24-22.    Ok Edwards NP [facility]  call [phone]

## 2022-07-15 ENCOUNTER — Other Ambulatory Visit (HOSPITAL_COMMUNITY)
Admission: RE | Admit: 2022-07-15 | Discharge: 2022-07-15 | Disposition: A | Discharge status: Home / Self Care | Payer: Medicare HMO | Source: Skilled Nursing Facility | Attending: Adult Health | Admitting: Adult Health

## 2022-07-15 ENCOUNTER — Encounter: Payer: Self-pay | Admitting: Adult Health

## 2022-07-15 ENCOUNTER — Non-Acute Institutional Stay (SKILLED_NURSING_FACILITY): Payer: Medicare HMO | Admitting: Adult Health

## 2022-07-15 DIAGNOSIS — K7200 Acute and subacute hepatic failure without coma: Secondary | ICD-10-CM

## 2022-07-15 DIAGNOSIS — I700 Atherosclerosis of aorta: Secondary | ICD-10-CM

## 2022-07-15 DIAGNOSIS — R6520 Severe sepsis without septic shock: Secondary | ICD-10-CM

## 2022-07-15 DIAGNOSIS — J690 Pneumonitis due to inhalation of food and vomit: Secondary | ICD-10-CM

## 2022-07-15 DIAGNOSIS — A419 Sepsis, unspecified organism: Secondary | ICD-10-CM | POA: Insufficient documentation

## 2022-07-15 DIAGNOSIS — G894 Chronic pain syndrome: Secondary | ICD-10-CM

## 2022-07-15 DIAGNOSIS — D500 Iron deficiency anemia secondary to blood loss (chronic): Secondary | ICD-10-CM

## 2022-07-15 DIAGNOSIS — E785 Hyperlipidemia, unspecified: Secondary | ICD-10-CM

## 2022-07-15 LAB — BLOOD CULTURE ID PANEL (REFLEXED) - BCID2

## 2022-07-15 LAB — CBC WITH DIFFERENTIAL/PLATELET
Abs Immature Granulocytes: 0.02 10*3/uL (ref 0.00–0.07)
Basophils Absolute: 0 10*3/uL (ref 0.0–0.1)
Basophils Relative: 0 %
Eosinophils Absolute: 0.2 10*3/uL (ref 0.0–0.5)
Eosinophils Relative: 3 %
HCT: 31.2 % — ABNORMAL LOW (ref 36.0–46.0)
Hemoglobin: 9.4 g/dL — ABNORMAL LOW (ref 12.0–15.0)
Immature Granulocytes: 0 %
Lymphocytes Relative: 24 %
Lymphs Abs: 1.7 10*3/uL (ref 0.7–4.0)
MCH: 25.7 pg — ABNORMAL LOW (ref 26.0–34.0)
MCHC: 30.1 g/dL (ref 30.0–36.0)
MCV: 85.2 fL (ref 80.0–100.0)
Monocytes Absolute: 0.6 10*3/uL (ref 0.1–1.0)
Monocytes Relative: 9 %
Neutro Abs: 4.5 10*3/uL (ref 1.7–7.7)
Neutrophils Relative %: 64 %
Platelets: 246 10*3/uL (ref 150–400)
RBC: 3.66 MIL/uL — ABNORMAL LOW (ref 3.87–5.11)
RDW: 15.5 % (ref 11.5–15.5)
WBC: 7.1 10*3/uL (ref 4.0–10.5)
nRBC: 0 % (ref 0.0–0.2)

## 2022-07-15 LAB — COMPREHENSIVE METABOLIC PANEL
ALT: 85 U/L — ABNORMAL HIGH (ref 0–44)
AST: 50 U/L — ABNORMAL HIGH (ref 15–41)
Albumin: 2.5 g/dL — ABNORMAL LOW (ref 3.5–5.0)
Alkaline Phosphatase: 283 U/L — ABNORMAL HIGH (ref 38–126)
Anion gap: 5 (ref 5–15)
BUN: 28 mg/dL — ABNORMAL HIGH (ref 8–23)
CO2: 30 mmol/L (ref 22–32)
Calcium: 8.2 mg/dL — ABNORMAL LOW (ref 8.9–10.3)
Chloride: 100 mmol/L (ref 98–111)
Creatinine, Ser: 0.52 mg/dL (ref 0.44–1.00)
GFR, Estimated: >60 mL/min (ref >60)
Glucose, Bld: 134 mg/dL — ABNORMAL HIGH (ref 70–99)
Potassium: 4.6 mmol/L (ref 3.5–5.1)
Sodium: 135 mmol/L (ref 135–145)
Total Bilirubin: 0.5 mg/dL (ref 0.3–1.2)
Total Protein: 6.2 g/dL — ABNORMAL LOW (ref 6.5–8.1)

## 2022-07-15 NOTE — Progress Notes (Signed)
Location:  [place] Room Number: [place] 159P Place of Service:  SNF (31)   CODE STATUS: Full Code  Allergies  Allergen Reactions   Buprenorphine Hcl Itching   Cymbalta [Duloxetine Hcl] Other (See Comments)    Other reaction(s): Other (See Comments) Personality changes - crying  2014   Morphine And Related Itching   Oxycodone-Acetaminophen Other (See Comments)    Personality changes    Valium [Diazepam] Other (See Comments)    Personality changes - "in another world" ;  Hallucinations  2015   Statins Other (See Comments)    Other reaction(s): Other (See Comments) Muscle weakness Muscle weakness   Morphine Itching   Altace [Ramipril] Other (See Comments)    unknown   Floxin [Ofloxacin] Other (See Comments)    unknown   Hydromorphone Other (See Comments)    Other reaction(s): Confusion (intolerance) unknown   Lovaza [Omega-3-Acid Ethyl Esters] Other (See Comments)    Muscle weakness    Trilipix [Choline Fenofibrate] Other (See Comments)    Muscle weakness    Zetia [Ezetimibe] Other (See Comments)    Muscle weakness     Chief Complaint  Patient presents with   Medical Management of Chronic Issues                                                             Chronic pain associated with significant psychosocial dysfunction:  Iron deficiency anemia associated with chronic blood loss: Aortic atherosclerosis:  Hyperlipidemia LDL goal <100    HPI:  She is a 81 year old long term resident of this facility being seen for the management of her chronic illnesses: Chronic pain associated with significant psychosocial dysfunction:  Iron deficiency anemia associated with chronic blood loss: Aortic atherosclerosis:  Hyperlipidemia LDL goal <100. She has aspiration pneumonia; has sepsis with positive blood culture; positive urine culture. Her liver enzymes are elevated; will need to change her abt.   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 Abnl nuc 11/2014. Cath 12/2014 - turned down for CABG. Ultimately s/p TTVP, rotational atherectomy, PTCA and stenting of the ostial LCx and left main into the LAD (crush technique), and IVUS of the LAD/Left main. // b. Myoview 11/17: EF 48, poor quality/significant artifact; inf-lateral, inferior ischemia; Intermediate Risk   Chronic atrial fibrillation ([place])    a.  First noted post-op 9/15 spinal fusion.  She had cardioversion, not on anticoagulation. Fall risk, unsteady. // failed DCCV // Holter 10/17: AFib, Avg HR 97, PVCs, no other arrhythmia   Chronic back pain greater than 3 months duration    a. spinal stenosis.  Spinal fusion with rods in 2/15 at [facility] spinal fusion 9/15.   Chronic diastolic CHF    Echo 3/79:  EF 50-55%, trivial AI, midl MR, mod LAE, PASP 37 mmHg // Echocardiogram 03/2020: EF 45-50, no RWMA, mild LVH, mild reduced RVSF, mildly elevated PASP (RVSP 38.3), mild LAE, mild MR, mild to mod TR, trivial AI // Echo 9/21: EF 45, mild LVH, mildly reduced RV SF,  moderate LAE, mild RAE, mild MR, mild AI      Circadian rhythm sleep disorder    CTS (carpal tunnel syndrome)    Deficiency anemia 11/10/2014   Depression    Diarrhea    Diverticulitis of colon    Esophageal stricture    Gastritis    Gastroesophageal hernia 07/06/2013   GERD (gastroesophageal reflux disease)    Hiatal hernia    History of blood transfusion    "most of them related to OR's"    HTN (hypertension)    Hyperlipidemia    IBS (irritable bowel syndrome)    Incontinence 10/13/2012   Insomnia    Ischemic chest pain ([place])    Memory disorder 01/60/1093   Metabolic syndrome 23/55/7322   MGUS (monoclonal gammopathy of unknown significance) dx'd 11/2014   a. Neg BMB 11/2014.   Multiple falls     Obesity    Obstructive sleep apnea    "have mask; don't wear it" (01/07/2015)   Orthostasis    PAT (paroxysmal atrial tachycardia) ([place])    Personal history of colonic polyps 10/25/2011 & 12/02/11   not retrieved Dr Lyla Son & tubular adenomas   Pneumonia 03/2014   Sinus bradycardia    a. Baseline HR 50s-60s.   Stroke [facility]) early 2000's   "small"; denies residual on 01/07/2015)    Past Surgical History:  Procedure Laterality Date   BACK SURGERY     BONE MARROW BIOPSY  11/2014   CARDIAC CATHETERIZATION  12/27/2014   Procedure: INTRAVASCULAR PRESSURE WIRE/FFR STUDY;  Surgeon: Larey Dresser, MD;  [facility] LAB;  Service: Cardiovascular;;   CARDIAC CATHETERIZATION N/A 11/25/2016   Procedure: Left Heart Cath and Coronary Angiography;  Surgeon: Sherren Mocha, MD;  Location: [place] CV LAB;  Service: Cardiovascular;  Laterality: N/A;   CARDIOVERSION N/A 02/13/2016   Procedure: CARDIOVERSION;  Surgeon: Josue Hector, MD;  Location: [place];  Service: Cardiovascular;  Laterality: N/A;   CARPAL TUNNEL RELEASE Right 1980's   CATARACT EXTRACTION Bilateral    CATARACT EXTRACTION W/ INTRAOCULAR LENS  IMPLANT, BILATERAL  2000's   CORONARY ANGIOPLASTY WITH STENT PLACEMENT  01/07/2015   "2"   CORONARY STENT PLACEMENT     DILATION AND CURETTAGE OF UTERUS     ESOPHAGOGASTRODUODENOSCOPY (EGD) WITH ESOPHAGEAL DILATION  "several times"   JOINT REPLACEMENT     KNEE ARTHROSCOPY Left 1995   LAPAROSCOPIC CHOLECYSTECTOMY  2003   LEFT HEART CATHETERIZATION WITH CORONARY ANGIOGRAM N/A 12/27/2014   Procedure: LEFT HEART CATHETERIZATION WITH CORONARY ANGIOGRAM;  Surgeon: Larey Dresser, MD;  Location: [facility] CATH LAB;  Service: Cardiovascular;  Laterality: N/A;   PERCUTANEOUS CORONARY ROTOBLATOR INTERVENTION (PCI-R)  01/07/2015   PERCUTANEOUS CORONARY ROTOBLATOR INTERVENTION (PCI-R) N/A 01/07/2015   Procedure: PERCUTANEOUS CORONARY ROTOBLATOR INTERVENTION (PCI-R);  Surgeon: Blane Ohara, MD;   Location: [facility] CATH LAB;  Service: Cardiovascular;  Laterality: N/A;   POSTERIOR FUSION THORACIC SPINE  08/2015   POSTERIOR LUMBAR FUSION  01/2015   TOTAL KNEE ARTHROPLASTY Bilateral 1990's - 2000's    Social History   Socioeconomic History   Marital status: Widowed    Spouse name: Not on file   Number of children: 2   Years of education: HS   Highest education level: Not on file  Occupational History   Occupation: Licensed conveyancer- retired    Comment: retired  Tobacco Use   Smoking status: Never   Smokeless tobacco: Never  Vaping Use   Vaping Use: Never used  Substance and Sexual  Activity   Alcohol use: No    Comment: 01/07/2015 "glass of wine at Christmas, maybe"   Drug use: No   Sexual activity: Not Currently  Other Topics Concern   Not on file  Social History Narrative   Patient is right handed   Patient drinks 1-2 sodas daily.   patlient lives alone.   Social Determinants of Health   Financial Resource Strain: Not on file  Food Insecurity: Not on file  Transportation Needs: Not on file  Physical Activity: Inactive (04/04/2020)   Exercise Vital Sign    Days of Exercise per Week: 0 days    Minutes of Exercise per Session: 0 min  Stress: Stress Concern Present (04/04/2020)   [place]    Feeling of Stress : To some extent  Social Connections: Socially Isolated (04/04/2020)   Social Connection and Isolation Panel [NHANES]    Frequency of Communication with Friends and Family: Three times a week    Frequency of Social Gatherings with Friends and Family: Once a week    Attends Religious Services: Never    Marine scientist or Organizations: No    Attends Archivist Meetings: Never    Marital Status: Widowed  Human resources officer Violence: Not on file   Family History  Problem Relation Age of Onset   Coronary artery disease Father    Peripheral vascular disease Father    Coronary artery disease  Mother    Coronary artery disease Brother    Colon cancer Sister    Emphysema Sister    Sleep apnea Son    Colon cancer Sister        spread to her brain   Emphysema Brother    Dementia Neg Hx       VITAL SIGNS BP 108/63   Pulse 88   Temp 99 F (37.2 C) (Skin)   Ht 5' 6"  (1.676 m)   Wt 174 lb (78.9 kg)   SpO2 97%   BMI 28.08 kg/m   Outpatient Encounter Medications as of 07/15/2022  Medication Sig   acetaminophen (TYLENOL) 160 MG/5ML liquid Take 20 mLs by mouth every 8 (eight) hours as needed for fever.   albuterol (VENTOLIN HFA) 108 (90 Base) MCG/ACT inhaler Inhale 2 puffs into the lungs every 6 (six) hours as needed for wheezing or shortness of breath.   Amino Acids-Protein Hydrolys (PRO-STAT PO) Take 30 mLs by mouth 3 (three) times daily. In tube   aspirin EC 81 MG tablet Take 81 mg by mouth daily. Swallow whole.   Balsam [place]-Castor Oil (VENELEX) OINT Apply 1 application. topically daily.   busPIRone (BUSPAR) 5 MG tablet Take 5 mg by mouth 2 (two) times daily.   Cefepime HCl 2 GM/100ML SOLN Inject 2 g into the vein every 8 (eight) hours.   Cholecalciferol 25 MCG (1000 UT) tablet Place 1,000 Units into feeding tube daily. 9 am   clobetasol (TEMOVATE) 0.05 % external solution Apply 1 application. topically 2 (two) times a week. Monday and Friday   diclofenac Sodium (VOLTAREN) 1 % GEL Apply 2 g topically 3 (three) times daily. to neck and shoulders for pain management   diltiazem (CARDIZEM) 120 MG tablet Take 120 mg by mouth every 8 (eight) hours.   diphenoxylate-atropine (LOMOTIL) 2.5-0.025 MG/5ML liquid Place 5 mLs into feeding tube 3 (three) times daily as needed for diarrhea or loose stools.   ELIQUIS 5 MG TABS tablet Take 1 tablet (5 mg total)  by mouth 2 (two) times daily.   eszopiclone 3 MG TABS Take 1 tablet (3 mg total) by mouth at bedtime. Take immediately before bedtime   fentaNYL (DURAGESIC) 50 MCG/HR Place 1 patch onto the skin every 3 (three) days.   FLUoxetine  (PROZAC) 20 MG/5ML solution Place 60 mg into feeding tube daily.   furosemide (LASIX) 40 MG tablet Place 40 mg into feeding tube 2 (two) times daily. 9 am   ipratropium-albuterol (DUONEB) 0.5-2.5 (3) MG/3ML SOLN Take 3 mLs by nebulization every 6 (six) hours as needed.   LINZESS 145 MCG CAPS capsule Place 145 mcg into feeding tube daily as needed (constipation).   loperamide HCl (IMODIUM) 1 MG/7.5ML suspension Place 2 mg into feeding tube 4 (four) times daily as needed for diarrhea or loose stools.   meclizine (ANTIVERT) 25 MG tablet Place 25 mg into feeding tube 3 (three) times daily as needed for dizziness.   naloxone (NARCAN) nasal spray 4 mg/0.1 mL Special Instructions: As needed for depressed respirations less than 8/minute. Notify provider if given. If no effectiveness or improvement, contact 911. As Needed   nitroGLYCERIN (NITROSTAT) 0.4 MG SL tablet Place 1 tablet (0.4 mg total) under the tongue every 5 (five) minutes as needed for chest pain (up to 3 doses).   Nutritional Supplements (FEEDING SUPPLEMENT, JEVITY 1.2 CAL,) LIQD Place into feeding tube as directed. 50 cc/hr Every Shift; Day, Evening, Night   Omeprazole-Sodium Bicarbonate (KONVOMEP) 2-84 MG/ML SUSR Give 10 mLs by tube in the morning and at bedtime.   potassium chloride 20 MEQ/15ML (10%) SOLN Take 20 mEq by mouth daily. For potassium supplement   rOPINIRole (REQUIP) 0.5 MG tablet Place 0.5 mg into feeding tube in the morning and at bedtime.   rosuvastatin (CRESTOR) 10 MG tablet Place 10 mg into feeding tube daily. 9 am   [DISCONTINUED] acetaminophen (TYLENOL) 650 MG CR tablet Take 650 mg by mouth every 8 (eight) hours as needed (Pain management). (Patient not taking: Reported on 07/15/2022)   No facility-administered encounter medications on file as of 07/15/2022.     SIGNIFICANT DIAGNOSTIC EXAMS   PREVIOUS   02-23-22: TEE:  The left ventricular size is normal.  Mild left ventricular hypertrophy  LV ejection fraction =  45-50%.  Left ventricular systolic function is mildly reduced.  The right ventricle is normal in size and function.  The left atrium is moderately to severely dilated.  The right atrium is mildly to moderately dilated.  Estimated right ventricular systolic pressure is 27 mmHg.  IVC size was normal.  There is no significant valvular stenosis or regurgitation.  There is no pericardial effusion.  There is a pleural effusion present.  Recommend a limited TTE when HR is controlled to reassess   05-23-22: chest x-ray: mild congestive heart failure  07-14-22: chest x-ray:  Changes of bibasilar atelectasis  Nonspecific perihilar inflammatory changes Bilateral spinal fusion hardware   NO NEW EXAMS.   LABS REVIEWED: PREVIOUS   02-27-22: glucose 102; bun 13; creat 0.52; k+ 4.8; na++ 134; ca 8.9; GFR>90 04-08-22: wbc 7.2 hgb 7.2; hct 11.9; mcv 35.2 plt 185; glucose 114; bun 11; creat 0.37; k+ 4.5; na++ 132; ca 8.6; GFR >90 05-21-22: glucose 113; bun 19; creat 0.5; k+ 4.2; na++ 135; ca 8.5 gfr>60; protein 5.2; albumin 2.4  05-23-22: wbc 5.4; hgb 9.2; hct 29.6 plt 184; glucose 123; bun 21; creat 0.54; k+ 3.8; na++ 8.2; ca 8.2 gfr >60  TODAY;   07-01-22: glucose 91; bun 16; creat 0.44;  k+ 4.1; na++ 135; ca 8.6; gfr>60 07-14-22: wbc 9.9; hgb 10.8; hct 35.3; mcv 83.8 plt 283; glucose 168; bun 24; creat 0.52; k+ 4.2; na++ 133; ca 8.6 gfr >60 urine culture: gram neg rods; blood culture: klebsiella pneumoniae  07-15-22: wbc 7.1; hgb 9.4; hct 31.2; mcv 85.2 plt 246; glucose 134; bun 28; creat 0.52; k+ 4.6; na++ 135; ca 8.2; gfr >60; protein 6.2 albumin 2.5; ast 50 alt 85 alk phos 283   Review of Systems  Unable to perform ROS: Medical condition   Physical Exam Constitutional:      General: She is not in acute distress.    Appearance: She is well-developed. She is not diaphoretic.  Neck:     Thyroid: No thyromegaly.  Cardiovascular:     Rate and Rhythm: Tachycardia present. Rhythm irregular.     Pulses:  Normal pulses.     Heart sounds: Normal heart sounds.  Pulmonary:     Effort: Pulmonary effort is normal. No respiratory distress.     Breath sounds: Rhonchi and rales present.  Abdominal:     General: Bowel sounds are normal. There is no distension.     Palpations: Abdomen is soft.     Tenderness: There is no abdominal tenderness.  Musculoskeletal:        General: Normal range of motion.     Cervical back: Neck supple.     Right lower leg: No edema.     Left lower leg: No edema.  Lymphadenopathy:     Cervical: No cervical adenopathy.  Skin:    General: Skin is warm and dry.  Neurological:     Mental Status: She is alert. Mental status is at baseline.  Psychiatric:        Mood and Affect: Mood normal.       ASSESSMENT/ PLAN:  TODAY  Sepsis with acute liver failure without hepatic coma or septic shock unspecified organism due to aspiration pneumonia  Will stop augmentin Will begin cefepime 2 gm IV every 8 hours through 07-27-22.  Will insert picc line Will check cbc; cmp ammonia level in the AM   2. Chronic pain associated with significant psychosocial dysfunction: will continue duragesic 50 mcg patch every 3 days; is off vicodin.  3. Iron deficiency anemia associated with chronic blood loss: hgb 9.4 will monitor  4. Aortic atherosclerosis: (ct 03-17-22) is on statin and asa   5. Hyperlipidemia LDL goal <100: will continue crestor 10 mg daily   PREVIOUS   5. Irritable bowel syndrome with both constipation and diarrhea: will continue linzess 145 mcg daily as needed  has lomotil and imodium as needed  6. Insomnia: will continue lunesta 3 mg nightly   7. Restless leg syndrome: will continue requip 0.5 mg twice daily   8. Esophageal stricture/gastroesophageal reflux disease without esophagitis/ paraesophageal hernia: will continue konvopem 2-84 mg/ml 10 cc twice daily is on tube feeding.   9. Chronic diastolic CHF (chronic congestive heart failure) EF40-45%; will  continue lasix 40 mg daily   10. Permanent atrial fibrillation: heart rate is stable will continue diltiazem 120 mg daily for rate control eliquis 5 mg twice daily   11. Coronary artery disease involving native artery of native heart with unstable angina: will continue asa 81 mg daily has prn ntg.   12. Major depression recurrent chronic: is stable will continue prozac  60 mg daily and buspar 5 mg twice daily for anxiety    Ok Edwards NP [facility]  call [phone]

## 2022-07-16 ENCOUNTER — Other Ambulatory Visit (HOSPITAL_COMMUNITY)
Admission: RE | Admit: 2022-07-16 | Discharge: 2022-07-16 | Disposition: A | Discharge status: Home / Self Care | Payer: Medicare HMO | Source: Skilled Nursing Facility | Attending: Adult Health | Admitting: Adult Health

## 2022-07-16 ENCOUNTER — Other Ambulatory Visit: Payer: Self-pay | Admitting: Adult Health

## 2022-07-16 ENCOUNTER — Encounter: Payer: Self-pay | Admitting: Adult Health

## 2022-07-16 ENCOUNTER — Non-Acute Institutional Stay (SKILLED_NURSING_FACILITY): Payer: Medicare HMO | Admitting: Adult Health

## 2022-07-16 DIAGNOSIS — J690 Pneumonitis due to inhalation of food and vomit: Secondary | ICD-10-CM

## 2022-07-16 DIAGNOSIS — A419 Sepsis, unspecified organism: Secondary | ICD-10-CM | POA: Insufficient documentation

## 2022-07-16 DIAGNOSIS — F339 Major depressive disorder, recurrent, unspecified: Secondary | ICD-10-CM

## 2022-07-16 LAB — CBC WITH DIFFERENTIAL/PLATELET
Abs Immature Granulocytes: 0.02 10*3/uL (ref 0.00–0.07)
Basophils Absolute: 0 10*3/uL (ref 0.0–0.1)
Basophils Relative: 1 %
Eosinophils Absolute: 0.3 10*3/uL (ref 0.0–0.5)
Eosinophils Relative: 5 %
HCT: 31.4 % — ABNORMAL LOW (ref 36.0–46.0)
Hemoglobin: 9.5 g/dL — ABNORMAL LOW (ref 12.0–15.0)
Immature Granulocytes: 0 %
Lymphocytes Relative: 25 %
Lymphs Abs: 1.6 10*3/uL (ref 0.7–4.0)
MCH: 25.8 pg — ABNORMAL LOW (ref 26.0–34.0)
MCHC: 30.3 g/dL (ref 30.0–36.0)
MCV: 85.3 fL (ref 80.0–100.0)
Monocytes Absolute: 0.6 10*3/uL (ref 0.1–1.0)
Monocytes Relative: 10 %
Neutro Abs: 3.8 10*3/uL (ref 1.7–7.7)
Neutrophils Relative %: 59 %
Platelets: 255 10*3/uL (ref 150–400)
RBC: 3.68 MIL/uL — ABNORMAL LOW (ref 3.87–5.11)
RDW: 15.4 % (ref 11.5–15.5)
WBC: 6.4 10*3/uL (ref 4.0–10.5)
nRBC: 0 % (ref 0.0–0.2)

## 2022-07-16 LAB — COMPREHENSIVE METABOLIC PANEL
ALT: 109 U/L — ABNORMAL HIGH (ref 0–44)
AST: 91 U/L — ABNORMAL HIGH (ref 15–41)
Albumin: 2.5 g/dL — ABNORMAL LOW (ref 3.5–5.0)
Alkaline Phosphatase: 332 U/L — ABNORMAL HIGH (ref 38–126)
Anion gap: 7 (ref 5–15)
BUN: 23 mg/dL (ref 8–23)
CO2: 31 mmol/L (ref 22–32)
Calcium: 8.3 mg/dL — ABNORMAL LOW (ref 8.9–10.3)
Chloride: 97 mmol/L — ABNORMAL LOW (ref 98–111)
Creatinine, Ser: 0.49 mg/dL (ref 0.44–1.00)
GFR, Estimated: >60 mL/min (ref >60)
Glucose, Bld: 130 mg/dL — ABNORMAL HIGH (ref 70–99)
Potassium: 4 mmol/L (ref 3.5–5.1)
Sodium: 135 mmol/L (ref 135–145)
Total Bilirubin: 0.5 mg/dL (ref 0.3–1.2)
Total Protein: 6 g/dL — ABNORMAL LOW (ref 6.5–8.1)

## 2022-07-16 LAB — URINE CULTURE: Culture: >=100000 — AB

## 2022-07-16 LAB — AMMONIA: Ammonia: 29 umol/L (ref 9–35)

## 2022-07-16 NOTE — Progress Notes (Signed)
Location:  [facility] Room Number: 159-P Place of Service:  SNF (31)   CODE STATUS: Full Code   Allergies  Allergen Reactions   Buprenorphine Hcl Itching   Cymbalta [Duloxetine Hcl] Other (See Comments)    Other reaction(s): Other (See Comments) Personality changes - crying  2014   Morphine And Related Itching   Oxycodone-Acetaminophen Other (See Comments)    Personality changes    Valium [Diazepam] Other (See Comments)    Personality changes - "in another world" ;  Hallucinations  2015   Statins Other (See Comments)    Other reaction(s): Other (See Comments) Muscle weakness Muscle weakness   Morphine Itching   Altace [Ramipril] Other (See Comments)    unknown   Floxin [Ofloxacin] Other (See Comments)    unknown   Hydromorphone Other (See Comments)    Other reaction(s): Confusion (intolerance) unknown   Lovaza [Omega-3-Acid Ethyl Esters] Other (See Comments)    Muscle weakness    Trilipix [Choline Fenofibrate] Other (See Comments)    Muscle weakness    Zetia [Ezetimibe] Other (See Comments)    Muscle weakness     Chief Complaint  Patient presents with   Acute Visit    Follow-up on status     HPI:  She was started on cefepime yesterday for positive blood culture; urine culture. She is tolerating this medication at this time. She has had nausea and vomiting. She is not an appropriate candidate for reglan due to the side effect profile. She is anxious is unable to lay still. There are no reports of fevers.   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 Abnl nuc 11/2014. Cath 12/2014 - turned down for CABG. Ultimately s/p TTVP, rotational atherectomy, PTCA and stenting of the ostial LCx and left main into the LAD (crush  technique), and IVUS of the LAD/Left main. // b. Myoview 11/17: EF 48, poor quality/significant artifact; inf-lateral, inferior ischemia; Intermediate Risk   Chronic atrial fibrillation ([place])    a.  First noted post-op 9/15 spinal fusion.  She had cardioversion, not on anticoagulation. Fall risk, unsteady. // failed DCCV // Holter 10/17: AFib, Avg HR 97, PVCs, no other arrhythmia   Chronic back pain greater than 3 months duration    a. spinal stenosis.  Spinal fusion with rods in 2/15 at [facility] spinal fusion 9/15.   Chronic diastolic CHF    Echo 7/42:  EF 50-55%, trivial AI, midl MR, mod LAE, PASP 37 mmHg // Echocardiogram 03/2020: EF 45-50, no RWMA, mild LVH, mild reduced RVSF, mildly elevated PASP (RVSP 38.3), mild LAE, mild MR, mild to mod TR, trivial AI // Echo 9/21: EF 45, mild LVH, mildly reduced RV SF, moderate LAE, mild RAE, mild MR, mild AI      Circadian rhythm sleep disorder    CTS (carpal tunnel syndrome)    Deficiency anemia 11/10/2014   Depression    Diarrhea    Diverticulitis of colon    Esophageal stricture    Gastritis    Gastroesophageal hernia 07/06/2013   GERD (gastroesophageal reflux disease)    Hiatal hernia    History of blood transfusion    "most of them related to OR's"    HTN (hypertension)  Hyperlipidemia    IBS (irritable bowel syndrome)    Incontinence 10/13/2012   Insomnia    Ischemic chest pain ([place])    Memory disorder 89/[birth year]/1941   Metabolic syndrome 74/07/1447   MGUS (monoclonal gammopathy of unknown significance) dx'd 11/2014   a. Neg BMB 11/2014.   Multiple falls    Obesity    Obstructive sleep apnea    "have mask; don't wear it" (01/07/2015)   Orthostasis    PAT (paroxysmal atrial tachycardia) ([place])    Personal history of colonic polyps 10/25/2011 & 12/02/11   not retrieved Dr Lyla Son & tubular adenomas   Pneumonia 03/2014   Sinus bradycardia    a. Baseline HR 50s-60s.   Stroke [facility]) early 2000's   "small"; denies residual on  01/07/2015)    Past Surgical History:  Procedure Laterality Date   BACK SURGERY     BONE MARROW BIOPSY  11/2014   CARDIAC CATHETERIZATION  12/27/2014   Procedure: INTRAVASCULAR PRESSURE WIRE/FFR STUDY;  Surgeon: Larey Dresser, MD;  Location: [facility] CATH LAB;  Service: Cardiovascular;;   CARDIAC CATHETERIZATION N/A 11/25/2016   Procedure: Left Heart Cath and Coronary Angiography;  Surgeon: Sherren Mocha, MD;  Location: [place] CV LAB;  Service: Cardiovascular;  Laterality: N/A;   CARDIOVERSION N/A 02/13/2016   Procedure: CARDIOVERSION;  Surgeon: Josue Hector, MD;  Location: [place];  Service: Cardiovascular;  Laterality: N/A;   CARPAL TUNNEL RELEASE Right 1980's   CATARACT EXTRACTION Bilateral    CATARACT EXTRACTION W/ INTRAOCULAR LENS  IMPLANT, BILATERAL  2000's   CORONARY ANGIOPLASTY WITH STENT PLACEMENT  01/07/2015   "2"   CORONARY STENT PLACEMENT     DILATION AND CURETTAGE OF UTERUS     ESOPHAGOGASTRODUODENOSCOPY (EGD) WITH ESOPHAGEAL DILATION  "several times"   JOINT REPLACEMENT     KNEE ARTHROSCOPY Left 1995   LAPAROSCOPIC CHOLECYSTECTOMY  2003   LEFT HEART CATHETERIZATION WITH CORONARY ANGIOGRAM N/A 12/27/2014   Procedure: LEFT HEART CATHETERIZATION WITH CORONARY ANGIOGRAM;  Surgeon: Larey Dresser, MD;  Location: [facility] CATH LAB;  Service: Cardiovascular;  Laterality: N/A;   PERCUTANEOUS CORONARY ROTOBLATOR INTERVENTION (PCI-R)  01/07/2015   PERCUTANEOUS CORONARY ROTOBLATOR INTERVENTION (PCI-R) N/A 01/07/2015   Procedure: PERCUTANEOUS CORONARY ROTOBLATOR INTERVENTION (PCI-R);  Surgeon: Blane Ohara, MD;  Location: [facility] CATH LAB;  Service: Cardiovascular;  Laterality: N/A;   POSTERIOR FUSION THORACIC SPINE  08/2015   POSTERIOR LUMBAR FUSION  01/2015   TOTAL KNEE ARTHROPLASTY Bilateral 1990's - 2000's    Social History   Socioeconomic History   Marital status: Widowed    Spouse name: Not on file   Number of children: 2   Years of education: HS   Highest education level: Not  on file  Occupational History   Occupation: Licensed conveyancer- retired    Comment: retired  Tobacco Use   Smoking status: Never   Smokeless tobacco: Never  Vaping Use   Vaping Use: Never used  Substance and Sexual Activity   Alcohol use: No    Comment: 01/07/2015 "glass of wine at Christmas, maybe"   Drug use: No   Sexual activity: Not Currently  Other Topics Concern   Not on file  Social History Narrative   Patient is right handed   Patient drinks 1-2 sodas daily.   patlient lives alone.   Social Determinants of Health   Financial Resource Strain: Not on file  Food Insecurity: Not on file  Transportation Needs: Not on file  Physical Activity: Inactive (04/04/2020)   Exercise  Vital Sign    Days of Exercise per Week: 0 days    Minutes of Exercise per Session: 0 min  Stress: Stress Concern Present (04/04/2020)   [place]    Feeling of Stress : To some extent  Social Connections: Socially Isolated (04/04/2020)   Social Connection and Isolation Panel [NHANES]    Frequency of Communication with Friends and Family: Three times a week    Frequency of Social Gatherings with Friends and Family: Once a week    Attends Religious Services: Never    Marine scientist or Organizations: No    Attends Archivist Meetings: Never    Marital Status: Widowed  Human resources officer Violence: Not on file   Family History  Problem Relation Age of Onset   Coronary artery disease Father    Peripheral vascular disease Father    Coronary artery disease Mother    Coronary artery disease Brother    Colon cancer Sister    Emphysema Sister    Sleep apnea Son    Colon cancer Sister        spread to her brain   Emphysema Brother    Dementia Neg Hx       VITAL SIGNS BP 108/63   Pulse 88   Temp 99 F (37.2 C)   Resp 20   Ht 5' 6"  (1.676 m)   Wt 174 lb (78.9 kg)   SpO2 97%   BMI 28.08 kg/m   Outpatient Encounter  Medications as of 07/16/2022  Medication Sig   acetaminophen (TYLENOL) 160 MG/5ML liquid Take 20 mLs by mouth every 8 (eight) hours as needed for fever.   albuterol (VENTOLIN HFA) 108 (90 Base) MCG/ACT inhaler Inhale 2 puffs into the lungs every 6 (six) hours as needed for wheezing or shortness of breath.   Amino Acids-Protein Hydrolys (PRO-STAT PO) Take 30 mLs by mouth 3 (three) times daily. In tube   aspirin EC 81 MG tablet Take 81 mg by mouth daily. Swallow whole.   Balsam [place]-Castor Oil (VENELEX) OINT Apply 1 application. topically daily.   busPIRone (BUSPAR) 5 MG tablet Take 5 mg by mouth 2 (two) times daily.   Cefepime HCl 2 GM/100ML SOLN Inject 2 g into the vein every 8 (eight) hours.   Cholecalciferol 25 MCG (1000 UT) tablet Place 1,000 Units into feeding tube daily. 9 am   clobetasol (TEMOVATE) 0.05 % external solution Apply 1 application. topically 2 (two) times a week. Monday and Friday   diclofenac Sodium (VOLTAREN) 1 % GEL Apply 2 g topically 3 (three) times daily. to neck and shoulders for pain management   diltiazem (CARDIZEM) 120 MG tablet Take 120 mg by mouth every 8 (eight) hours.   diphenoxylate-atropine (LOMOTIL) 2.5-0.025 MG/5ML liquid Place 5 mLs into feeding tube 3 (three) times daily as needed for diarrhea or loose stools.   ELIQUIS 5 MG TABS tablet Take 1 tablet (5 mg total) by mouth 2 (two) times daily.   eszopiclone 3 MG TABS Take 1 tablet (3 mg total) by mouth at bedtime. Take immediately before bedtime   fentaNYL (DURAGESIC) 50 MCG/HR Place 1 patch onto the skin every 3 (three) days.   FLUoxetine (PROZAC) 20 MG/5ML solution Place 60 mg into feeding tube daily.   furosemide (LASIX) 40 MG tablet Place 40 mg into feeding tube 2 (two) times daily. 9 am   ipratropium-albuterol (DUONEB) 0.5-2.5 (3) MG/3ML SOLN Take 3 mLs by nebulization every 6 (  six) hours as needed.   LINZESS 145 MCG CAPS capsule Place 145 mcg into feeding tube daily as needed (constipation).   loperamide  HCl (IMODIUM) 1 MG/7.5ML suspension Place 2 mg into feeding tube 4 (four) times daily as needed for diarrhea or loose stools.   meclizine (ANTIVERT) 25 MG tablet Place 25 mg into feeding tube 3 (three) times daily as needed for dizziness.   naloxone (NARCAN) nasal spray 4 mg/0.1 mL Special Instructions: As needed for depressed respirations less than 8/minute. Notify provider if given. If no effectiveness or improvement, contact 911. As Needed   nitroGLYCERIN (NITROSTAT) 0.4 MG SL tablet Place 1 tablet (0.4 mg total) under the tongue every 5 (five) minutes as needed for chest pain (up to 3 doses).   Omeprazole-Sodium Bicarbonate (KONVOMEP) 2-84 MG/ML SUSR Give 10 mLs by tube in the morning and at bedtime.   potassium chloride 20 MEQ/15ML (10%) SOLN Take 20 mEq by mouth daily. For potassium supplement   rOPINIRole (REQUIP) 0.5 MG tablet Place 0.5 mg into feeding tube in the morning and at bedtime.   rosuvastatin (CRESTOR) 10 MG tablet Place 10 mg into feeding tube daily. 9 am   Nutritional Supplements (FEEDING SUPPLEMENT, JEVITY 1.2 CAL,) LIQD Place into feeding tube as directed. 50 cc/hr Every Shift; Day, Evening, Night   No facility-administered encounter medications on file as of 07/16/2022.     SIGNIFICANT DIAGNOSTIC EXAMS   PREVIOUS   02-23-22: TEE:  The left ventricular size is normal.  Mild left ventricular hypertrophy  LV ejection fraction = 45-50%.  Left ventricular systolic function is mildly reduced.  The right ventricle is normal in size and function.  The left atrium is moderately to severely dilated.  The right atrium is mildly to moderately dilated.  Estimated right ventricular systolic pressure is 27 mmHg.  IVC size was normal.  There is no significant valvular stenosis or regurgitation.  There is no pericardial effusion.  There is a pleural effusion present.  Recommend a limited TTE when HR is controlled to reassess   05-23-22: chest x-ray: mild congestive heart  failure  07-14-22: chest x-ray:  Changes of bibasilar atelectasis  Nonspecific perihilar inflammatory changes Bilateral spinal fusion hardware   NO NEW EXAMS.   LABS REVIEWED: PREVIOUS   02-27-22: glucose 102; bun 13; creat 0.52; k+ 4.8; na++ 134; ca 8.9; GFR>90 04-08-22: wbc 7.2 hgb 7.2; hct 11.9; mcv 35.2 plt 185; glucose 114; bun 11; creat 0.37; k+ 4.5; na++ 132; ca 8.6; GFR >90 05-21-22: glucose 113; bun 19; creat 0.5; k+ 4.2; na++ 135; ca 8.5 gfr>60; protein 5.2; albumin 2.4  05-23-22: wbc 5.4; hgb 9.2; hct 29.6 plt 184; glucose 123; bun 21; creat 0.54; k+ 3.8; na++ 8.2; ca 8.2 gfr >60  TODAY;   07-01-22: glucose 91; bun 16; creat 0.44; k+ 4.1; na++ 135; ca 8.6; gfr>60 07-14-22: wbc 9.9; hgb 10.8; hct 35.3; mcv 83.8 plt 283; glucose 168; bun 24; creat 0.52; k+ 4.2; na++ 133; ca 8.6 gfr >60 urine culture: gram neg rods; blood culture: klebsiella pneumoniae  07-15-22: wbc 7.1; hgb 9.4; hct 31.2; mcv 85.2 plt 246; glucose 134; bun 28; creat 0.52; k+ 4.6; na++ 135; ca 8.2; gfr >60; protein 6.2 albumin 2.5; ast 50 alt 85 alk phos 283   Review of Systems  Constitutional:  Negative for malaise/fatigue.  Respiratory:  Positive for cough. Negative for shortness of breath.   Cardiovascular:  Negative for chest pain, palpitations and leg swelling.  Gastrointestinal:  Positive for nausea  and vomiting. Negative for abdominal pain, constipation and heartburn.  Musculoskeletal:  Negative for back pain, joint pain and myalgias.  Skin: Negative.   Neurological:  Negative for dizziness.  Psychiatric/Behavioral:  The patient is nervous/anxious.    Physical Exam Constitutional:      General: She is not in acute distress.    Appearance: She is well-developed. She is not diaphoretic.  Neck:     Thyroid: No thyromegaly.  Cardiovascular:     Rate and Rhythm: Normal rate and regular rhythm.     Pulses: Normal pulses.     Heart sounds: Normal heart sounds.  Pulmonary:     Effort: Pulmonary effort is  normal. No respiratory distress.     Breath sounds: Rhonchi and rales present.     Comments: 02 Abdominal:     General: Bowel sounds are normal. There is no distension.     Palpations: Abdomen is soft.     Tenderness: There is no abdominal tenderness.     Comments: Pef tube present   Musculoskeletal:        General: Normal range of motion.     Cervical back: Neck supple.     Right lower leg: No edema.     Left lower leg: No edema.  Lymphadenopathy:     Cervical: No cervical adenopathy.  Skin:    General: Skin is warm and dry.  Neurological:     Mental Status: She is alert. Mental status is at baseline.  Psychiatric:     Comments: Anxious       ASSESSMENT/ PLAN:  TODAY  Aspiration pneumonia due to gastric secretions unspecified laterality unspecified part of lung. Major depression recurrent chronic  Will increase buspar to 7.5 mg three times daily    Ok Edwards NP [facility]   call [phone]

## 2022-07-17 ENCOUNTER — Non-Acute Institutional Stay (SKILLED_NURSING_FACILITY): Payer: Medicare HMO | Admitting: Adult Health

## 2022-07-17 ENCOUNTER — Encounter: Payer: Self-pay | Admitting: Adult Health

## 2022-07-17 DIAGNOSIS — F339 Major depressive disorder, recurrent, unspecified: Secondary | ICD-10-CM

## 2022-07-17 DIAGNOSIS — I5032 Chronic diastolic (congestive) heart failure: Secondary | ICD-10-CM | POA: Diagnosis not present

## 2022-07-17 DIAGNOSIS — I700 Atherosclerosis of aorta: Secondary | ICD-10-CM | POA: Diagnosis not present

## 2022-07-17 DIAGNOSIS — I4821 Permanent atrial fibrillation: Secondary | ICD-10-CM | POA: Diagnosis not present

## 2022-07-17 LAB — CULTURE, BLOOD (ROUTINE X 2)

## 2022-07-17 NOTE — Progress Notes (Signed)
Location:  [facility] Room Number: 629 Place of Service:  SNF (31) Provider: Ok Edwards, NP   CODE STATUS: DNR  Allergies  Allergen Reactions   Buprenorphine Hcl Itching   Cymbalta [Duloxetine Hcl] Other (See Comments)    Other reaction(s): Other (See Comments) Personality changes - crying  2014   Morphine And Related Itching   Oxycodone-Acetaminophen Other (See Comments)    Personality changes    Valium [Diazepam] Other (See Comments)    Personality changes - "in another world" ;  Hallucinations  2015   Statins Other (See Comments)    Other reaction(s): Other (See Comments) Muscle weakness Muscle weakness   Morphine Itching   Altace [Ramipril] Other (See Comments)    unknown   Floxin [Ofloxacin] Other (See Comments)    unknown   Hydromorphone Other (See Comments)    Other reaction(s): Confusion (intolerance) unknown   Lovaza [Omega-3-Acid Ethyl Esters] Other (See Comments)    Muscle weakness    Trilipix [Choline Fenofibrate] Other (See Comments)    Muscle weakness    Zetia [Ezetimibe] Other (See Comments)    Muscle weakness     Chief Complaint  Patient presents with   Acute Visit    Care plan meeting.     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 Abnl nuc 11/2014. Cath 12/2014 - turned down for CABG. Ultimately s/p TTVP, rotational atherectomy, PTCA and stenting of the ostial LCx and left main into the LAD (crush technique), and IVUS of the LAD/Left main. // b. Myoview 11/17: EF 48, poor quality/significant artifact; inf-lateral, inferior ischemia; Intermediate Risk   Chronic atrial fibrillation ([place])    a.  First noted post-op 9/15 spinal fusion.  She had cardioversion, not on anticoagulation. Fall risk,  unsteady. // failed DCCV // Holter 10/17: AFib, Avg HR 97, PVCs, no other arrhythmia   Chronic back pain greater than 3 months duration    a. spinal stenosis.  Spinal fusion with rods in 2/15 at [facility] spinal fusion 9/15.   Chronic diastolic CHF    Echo 5/28:  EF 50-55%, trivial AI, midl MR, mod LAE, PASP 37 mmHg // Echocardiogram 03/2020: EF 45-50, no RWMA, mild LVH, mild reduced RVSF, mildly elevated PASP (RVSP 38.3), mild LAE, mild MR, mild to mod TR, trivial AI // Echo 9/21: EF 45, mild LVH, mildly reduced RV SF, moderate LAE, mild RAE, mild MR, mild AI      Circadian rhythm sleep disorder    CTS (carpal tunnel syndrome)    Deficiency anemia 11/10/2014   Depression    Diarrhea    Diverticulitis of colon    Esophageal stricture    Gastritis    Gastroesophageal hernia 07/06/2013   GERD (gastroesophageal reflux disease)    Hiatal hernia    History of blood transfusion    "most of them related to OR's"    HTN (hypertension)    Hyperlipidemia    IBS (irritable bowel syndrome)    Incontinence 10/13/2012   Insomnia    Ischemic chest pain ([place])    Memory disorder 41/32/4401   Metabolic syndrome 02/72/5366   MGUS (monoclonal gammopathy of unknown significance) dx'd 11/2014   a. Neg BMB 11/2014.  Multiple falls    Obesity    Obstructive sleep apnea    "have mask; don't wear it" (01/07/2015)   Orthostasis    PAT (paroxysmal atrial tachycardia) ([place])    Personal history of colonic polyps 10/25/2011 & 12/02/11   not retrieved Dr Lyla Son & tubular adenomas   Pneumonia 03/2014   Sinus bradycardia    a. Baseline HR 50s-60s.   Stroke [facility]) early 2000's   "small"; denies residual on 01/07/2015)    Past Surgical History:  Procedure Laterality Date   BACK SURGERY     BONE MARROW BIOPSY  11/2014   CARDIAC CATHETERIZATION  12/27/2014   Procedure: INTRAVASCULAR PRESSURE WIRE/FFR STUDY;  Surgeon: Larey Dresser, MD;  Location: [facility] CATH LAB;  Service: Cardiovascular;;   CARDIAC  CATHETERIZATION N/A 11/25/2016   Procedure: Left Heart Cath and Coronary Angiography;  Surgeon: Sherren Mocha, MD;  Location: [facility] CV LAB;  Service: Cardiovascular;  Laterality: N/A;   CARDIOVERSION N/A 02/13/2016   Procedure: CARDIOVERSION;  Surgeon: Josue Hector, MD;  Location: [facility];  Service: Cardiovascular;  Laterality: N/A;   CARPAL TUNNEL RELEASE Right 1980's   CATARACT EXTRACTION Bilateral    CATARACT EXTRACTION W/ INTRAOCULAR LENS  IMPLANT, BILATERAL  2000's   CORONARY ANGIOPLASTY WITH STENT PLACEMENT  01/07/2015   "2"   CORONARY STENT PLACEMENT     DILATION AND CURETTAGE OF UTERUS     ESOPHAGOGASTRODUODENOSCOPY (EGD) WITH ESOPHAGEAL DILATION  "several times"   JOINT REPLACEMENT     KNEE ARTHROSCOPY Left 1995   LAPAROSCOPIC CHOLECYSTECTOMY  2003   LEFT HEART CATHETERIZATION WITH CORONARY ANGIOGRAM N/A 12/27/2014   Procedure: LEFT HEART CATHETERIZATION WITH CORONARY ANGIOGRAM;  Surgeon: Larey Dresser, MD;  Location: [facility] CATH LAB;  Service: Cardiovascular;  Laterality: N/A;   PERCUTANEOUS CORONARY ROTOBLATOR INTERVENTION (PCI-R)  01/07/2015   PERCUTANEOUS CORONARY ROTOBLATOR INTERVENTION (PCI-R) N/A 01/07/2015   Procedure: PERCUTANEOUS CORONARY ROTOBLATOR INTERVENTION (PCI-R);  Surgeon: Blane Ohara, MD;  Location: [facility] CATH LAB;  Service: Cardiovascular;  Laterality: N/A;   POSTERIOR FUSION THORACIC SPINE  08/2015   POSTERIOR LUMBAR FUSION  01/2015   TOTAL KNEE ARTHROPLASTY Bilateral 1990's - 2000's    Social History   Socioeconomic History   Marital status: Widowed    Spouse name: Not on file   Number of children: 2   Years of education: HS   Highest education level: Not on file  Occupational History   Occupation: Licensed conveyancer- retired    Comment: retired  Tobacco Use   Smoking status: Never   Smokeless tobacco: Never  Vaping Use   Vaping Use: Never used  Substance and Sexual Activity   Alcohol use: No    Comment: 01/07/2015 "glass of wine at Christmas,  maybe"   Drug use: No   Sexual activity: Not Currently  Other Topics Concern   Not on file  Social History Narrative   Patient is right handed   Patient drinks 1-2 sodas daily.   patlient lives alone.   Social Determinants of Health   Financial Resource Strain: Not on file  Food Insecurity: Not on file  Transportation Needs: Not on file  Physical Activity: Inactive (04/04/2020)   Exercise Vital Sign    Days of Exercise per Week: 0 days    Minutes of Exercise per Session: 0 min  Stress: Stress Concern Present (04/04/2020)   [place]    Feeling of Stress : To some extent  Social Connections:  Socially Isolated (04/04/2020)   Social Connection and Isolation Panel [NHANES]    Frequency of Communication with Friends and Family: Three times a week    Frequency of Social Gatherings with Friends and Family: Once a week    Attends Religious Services: Never    Marine scientist or Organizations: No    Attends Archivist Meetings: Never    Marital Status: Widowed  Human resources officer Violence: Not on file   Family History  Problem Relation Age of Onset   Coronary artery disease Father    Peripheral vascular disease Father    Coronary artery disease Mother    Coronary artery disease Brother    Colon cancer Sister    Emphysema Sister    Sleep apnea Son    Colon cancer Sister        spread to her brain   Emphysema Brother    Dementia Neg Hx       VITAL SIGNS BP 108/63   Pulse 80   Temp 98.7 F (37.1 C)   Resp 18   Ht 5' 6"  (1.676 m)   Wt 174 lb (78.9 kg)   SpO2 97%   BMI 28.08 kg/m   Outpatient Encounter Medications as of 07/17/2022  Medication Sig   acetaminophen (TYLENOL) 160 MG/5ML liquid Take 20 mLs by mouth every 8 (eight) hours as needed for fever.   albuterol (VENTOLIN HFA) 108 (90 Base) MCG/ACT inhaler Inhale 2 puffs into the lungs every 6 (six) hours as needed for wheezing or  shortness of breath.   Amino Acids-Protein Hydrolys (PRO-STAT PO) Take 30 mLs by mouth 3 (three) times daily. In tube   aspirin EC 81 MG tablet Take 81 mg by mouth daily. Swallow whole.   Balsam [place]-Castor Oil (VENELEX) OINT Apply 1 application. topically daily.   busPIRone (BUSPAR) 5 MG tablet Take 5 mg by mouth 2 (two) times daily.   Cefepime HCl 2 GM/100ML SOLN Inject 2 g into the vein every 8 (eight) hours.   Cholecalciferol 25 MCG (1000 UT) tablet Place 1,000 Units into feeding tube daily. 9 am   clobetasol (TEMOVATE) 0.05 % external solution Apply 1 application. topically 2 (two) times a week. Monday and Friday   diclofenac Sodium (VOLTAREN) 1 % GEL Apply 2 g topically 3 (three) times daily. to neck and shoulders for pain management   diltiazem (CARDIZEM) 120 MG tablet Take 120 mg by mouth every 8 (eight) hours.   diphenoxylate-atropine (LOMOTIL) 2.5-0.025 MG/5ML liquid Place 5 mLs into feeding tube 3 (three) times daily as needed for diarrhea or loose stools.   ELIQUIS 5 MG TABS tablet Take 1 tablet (5 mg total) by mouth 2 (two) times daily.   eszopiclone 3 MG TABS Take 1 tablet (3 mg total) by mouth at bedtime. Take immediately before bedtime   fentaNYL (DURAGESIC) 25 MCG/HR Place 1 patch onto the skin every 3 (three) days.   FLUoxetine (PROZAC) 20 MG/5ML solution Place 60 mg into feeding tube daily.   furosemide (LASIX) 40 MG tablet Place 40 mg into feeding tube 2 (two) times daily. 9 am   ipratropium-albuterol (DUONEB) 0.5-2.5 (3) MG/3ML SOLN Take 3 mLs by nebulization every 6 (six) hours as needed.   LINZESS 145 MCG CAPS capsule Place 145 mcg into feeding tube daily as needed (constipation).   loperamide HCl (IMODIUM) 1 MG/7.5ML suspension Place 2 mg into feeding tube 4 (four) times daily as needed for diarrhea or loose stools.   meclizine (ANTIVERT) 25 MG  tablet Place 25 mg into feeding tube 3 (three) times daily as needed for dizziness.   naloxone (NARCAN) nasal spray 4 mg/0.1 mL  Special Instructions: As needed for depressed respirations less than 8/minute. Notify provider if given. If no effectiveness or improvement, contact 911. As Needed   nitroGLYCERIN (NITROSTAT) 0.4 MG SL tablet Place 1 tablet (0.4 mg total) under the tongue every 5 (five) minutes as needed for chest pain (up to 3 doses).   Omeprazole-Sodium Bicarbonate (KONVOMEP) 2-84 MG/ML SUSR Give 10 mLs by tube in the morning and at bedtime.   potassium chloride 20 MEQ/15ML (10%) SOLN Take 20 mEq by mouth daily. For potassium supplement   rOPINIRole (REQUIP) 0.5 MG tablet Place 0.5 mg into feeding tube in the morning and at bedtime.   rosuvastatin (CRESTOR) 10 MG tablet Place 10 mg into feeding tube daily. 9 am   [DISCONTINUED] fentaNYL (DURAGESIC) 50 MCG/HR Place 1 patch onto the skin every 3 (three) days.   [DISCONTINUED] Nutritional Supplements (FEEDING SUPPLEMENT, JEVITY 1.2 CAL,) LIQD Place into feeding tube as directed. 50 cc/hr Every Shift; Day, Evening, Night   No facility-administered encounter medications on file as of 07/17/2022.     SIGNIFICANT DIAGNOSTIC EXAMS       ASSESSMENT/ PLAN:     Ok Edwards NP [facility] Adult Medicine  Contact [phone] Monday through Friday 8am- 5pm  After hours call [phone]

## 2022-07-17 NOTE — Progress Notes (Signed)
Location:  [place] Room Number: 159 Place of Service:  SNF (31)   CODE STATUS: dnr   Allergies  Allergen Reactions   Buprenorphine Hcl Itching   Cymbalta [Duloxetine Hcl] Other (See Comments)    Other reaction(s): Other (See Comments) Personality changes - crying  2014   Morphine And Related Itching   Oxycodone-Acetaminophen Other (See Comments)    Personality changes    Valium [Diazepam] Other (See Comments)    Personality changes - "in another world" ;  Hallucinations  2015   Statins Other (See Comments)    Other reaction(s): Other (See Comments) Muscle weakness Muscle weakness   Morphine Itching   Altace [Ramipril] Other (See Comments)    unknown   Floxin [Ofloxacin] Other (See Comments)    unknown   Hydromorphone Other (See Comments)    Other reaction(s): Confusion (intolerance) unknown   Lovaza [Omega-3-Acid Ethyl Esters] Other (See Comments)    Muscle weakness    Trilipix [Choline Fenofibrate] Other (See Comments)    Muscle weakness    Zetia [Ezetimibe] Other (See Comments)    Muscle weakness     Chief Complaint  Patient presents with   Acute Visit    Care plan meeting.     HPI:  We have come together for her care plan meeting. Family present. She is able to ambulate with no falls. She requires extensive to dependent with adls. She is frequently incontinent of bladder; frequently incontinent of bowel. Dietary: weight is 174 pounds; peg tube dependent at night. She did not take her tube feeding last night.  therapy: on OT: declined due to recent onset of sepsis.   She continues to be followed for her chronic illnesses including: Aortic atherosclerosis   Chronic diastolic congestive heart failure (CHF)  Permanent atrial fibrillation Major depression chronic recurrent We have had a prolonged discussion regarding her advanced directives. She has told her family that she wants her tube feeding stopped as she is having a difficult time  tolerating her feeding. She is presently getting her feeding for 12 hours as night. She might do better with expanding her feeding to 20 hours per day. She is not a candidate for reglan. They wish at this time to continue her IV abt at this time. They would like to focus on comfort. Come Monday we will ask Faelyn if she wants to continue her tube feeding.   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 Abnl nuc 11/2014. Cath 12/2014 - turned down for CABG. Ultimately s/p TTVP, rotational atherectomy, PTCA and stenting of the ostial LCx and left main into the LAD (crush technique), and IVUS of the LAD/Left main. // b. Myoview 11/17: EF 48, poor quality/significant artifact; inf-lateral, inferior ischemia; Intermediate Risk   Chronic atrial fibrillation ([place])    a.  First noted post-op 9/15 spinal fusion.  She had cardioversion, not on anticoagulation. Fall risk, unsteady. // failed DCCV // Holter 10/17: AFib, Avg HR 97, PVCs, no other arrhythmia   Chronic back pain greater than 3 months duration    a. spinal stenosis.  Spinal fusion with rods in 2/15 at [facility] spinal fusion 9/15.   Chronic diastolic CHF    Echo 6/26:  EF 50-55%, trivial  AI, midl MR, mod LAE, PASP 37 mmHg // Echocardiogram 03/2020: EF 45-50, no RWMA, mild LVH, mild reduced RVSF, mildly elevated PASP (RVSP 38.3), mild LAE, mild MR, mild to mod TR, trivial AI // Echo 9/21: EF 45, mild LVH, mildly reduced RV SF, moderate LAE, mild RAE, mild MR, mild AI      Circadian rhythm sleep disorder    CTS (carpal tunnel syndrome)    Deficiency anemia 11/10/2014   Depression    Diarrhea    Diverticulitis of colon    Esophageal stricture    Gastritis    Gastroesophageal hernia 07/06/2013   GERD (gastroesophageal  reflux disease)    Hiatal hernia    History of blood transfusion    "most of them related to OR's"    HTN (hypertension)    Hyperlipidemia    IBS (irritable bowel syndrome)    Incontinence 10/13/2012   Insomnia    Ischemic chest pain ([place])    Memory disorder 18/84/1660   Metabolic syndrome 63/12/6008   MGUS (monoclonal gammopathy of unknown significance) dx'd 11/2014   a. Neg BMB 11/2014.   Multiple falls    Obesity    Obstructive sleep apnea    "have mask; don't wear it" (01/07/2015)   Orthostasis    PAT (paroxysmal atrial tachycardia) ([place])    Personal history of colonic polyps 10/25/2011 & 12/02/11   not retrieved Dr Lyla Son & tubular adenomas   Pneumonia 03/2014   Sinus bradycardia    a. Baseline HR 50s-60s.   Stroke [facility]) early 2000's   "small"; denies residual on 01/07/2015)    Past Surgical History:  Procedure Laterality Date   BACK SURGERY     BONE MARROW BIOPSY  11/2014   CARDIAC CATHETERIZATION  12/27/2014   Procedure: INTRAVASCULAR PRESSURE WIRE/FFR STUDY;  Surgeon: Larey Dresser, MD;  Location: [facility] CATH LAB;  Service: Cardiovascular;;   CARDIAC CATHETERIZATION N/A 11/25/2016   Procedure: Left Heart Cath and Coronary Angiography;  Surgeon: Sherren Mocha, MD;  Location: [facility] CV LAB;  Service: Cardiovascular;  Laterality: N/A;   CARDIOVERSION N/A 02/13/2016   Procedure: CARDIOVERSION;  Surgeon: Josue Hector, MD;  Location: [place];  Service: Cardiovascular;  Laterality: N/A;   CARPAL TUNNEL RELEASE Right 1980's   CATARACT EXTRACTION Bilateral    CATARACT EXTRACTION W/ INTRAOCULAR LENS  IMPLANT, BILATERAL  2000's   CORONARY ANGIOPLASTY WITH STENT PLACEMENT  01/07/2015   "2"   CORONARY STENT PLACEMENT     DILATION AND CURETTAGE OF UTERUS     ESOPHAGOGASTRODUODENOSCOPY (EGD) WITH ESOPHAGEAL DILATION  "several times"   JOINT REPLACEMENT     KNEE ARTHROSCOPY Left 1995   LAPAROSCOPIC CHOLECYSTECTOMY  2003   LEFT HEART CATHETERIZATION WITH CORONARY  ANGIOGRAM N/A 12/27/2014   Procedure: LEFT HEART CATHETERIZATION WITH CORONARY ANGIOGRAM;  Surgeon: Larey Dresser, MD;  Location: [facility] CATH LAB;  Service: Cardiovascular;  Laterality: N/A;   PERCUTANEOUS CORONARY ROTOBLATOR INTERVENTION (PCI-R)  01/07/2015   PERCUTANEOUS CORONARY ROTOBLATOR INTERVENTION (PCI-R) N/A 01/07/2015   Procedure: PERCUTANEOUS CORONARY ROTOBLATOR INTERVENTION (PCI-R);  Surgeon: Blane Ohara, MD;  Location: [facility] CATH LAB;  Service: Cardiovascular;  Laterality: N/A;   POSTERIOR FUSION THORACIC SPINE  08/2015   POSTERIOR LUMBAR FUSION  01/2015   TOTAL KNEE ARTHROPLASTY Bilateral 1990's - 2000's    Social History   Socioeconomic History   Marital status: Widowed    Spouse name: Not on file   Number of children: 2   Years of education: HS  Highest education level: Not on file  Occupational History   Occupation: Licensed conveyancer- retired    Comment: retired  Tobacco Use   Smoking status: Never   Smokeless tobacco: Never  Vaping Use   Vaping Use: Never used  Substance and Sexual Activity   Alcohol use: No    Comment: 01/07/2015 "glass of wine at Christmas, maybe"   Drug use: No   Sexual activity: Not Currently  Other Topics Concern   Not on file  Social History Narrative   Patient is right handed   Patient drinks 1-2 sodas daily.   patlient lives alone.   Social Determinants of Health   Financial Resource Strain: Not on file  Food Insecurity: Not on file  Transportation Needs: Not on file  Physical Activity: Inactive (04/04/2020)   Exercise Vital Sign    Days of Exercise per Week: 0 days    Minutes of Exercise per Session: 0 min  Stress: Stress Concern Present (04/04/2020)   [place]    Feeling of Stress : To some extent  Social Connections: Socially Isolated (04/04/2020)   Social Connection and Isolation Panel [NHANES]    Frequency of Communication with Friends and Family: Three times a week     Frequency of Social Gatherings with Friends and Family: Once a week    Attends Religious Services: Never    Marine scientist or Organizations: No    Attends Archivist Meetings: Never    Marital Status: Widowed  Human resources officer Violence: Not on file   Family History  Problem Relation Age of Onset   Coronary artery disease Father    Peripheral vascular disease Father    Coronary artery disease Mother    Coronary artery disease Brother    Colon cancer Sister    Emphysema Sister    Sleep apnea Son    Colon cancer Sister        spread to her brain   Emphysema Brother    Dementia Neg Hx       VITAL SIGNS BP 108/63   Pulse 80   Temp 98.7 F (37.1 C)   Resp 18   Ht 5' 6" (1.676 m)   Wt 174 lb (78.9 kg)   SpO2 97%   BMI 28.08 kg/m   Outpatient Encounter Medications as of 07/17/2022  Medication Sig   acetaminophen (TYLENOL) 160 MG/5ML liquid Take 20 mLs by mouth every 8 (eight) hours as needed for fever.   albuterol (VENTOLIN HFA) 108 (90 Base) MCG/ACT inhaler Inhale 2 puffs into the lungs every 6 (six) hours as needed for wheezing or shortness of breath.   Amino Acids-Protein Hydrolys (PRO-STAT PO) Take 30 mLs by mouth 3 (three) times daily. In tube   aspirin EC 81 MG tablet Take 81 mg by mouth daily. Swallow whole.   Balsam [place]-Castor Oil (VENELEX) OINT Apply 1 application. topically daily.   busPIRone (BUSPAR) 5 MG tablet Take 7.5 mg by mouth 2 (two) times daily.   Cefepime HCl 2 GM/100ML SOLN Inject 2 g into the vein every 8 (eight) hours.   Cholecalciferol 25 MCG (1000 UT) tablet Place 1,000 Units into feeding tube daily. 9 am   clobetasol (TEMOVATE) 0.05 % external solution Apply 1 application. topically 2 (two) times a week. Monday and Friday   diclofenac Sodium (VOLTAREN) 1 % GEL Apply 2 g topically 3 (three) times daily. to neck and shoulders for pain management   diltiazem (CARDIZEM) 120  MG tablet Take 120 mg by mouth every 8 (eight) hours.    diphenoxylate-atropine (LOMOTIL) 2.5-0.025 MG/5ML liquid Place 5 mLs into feeding tube 3 (three) times daily as needed for diarrhea or loose stools.   ELIQUIS 5 MG TABS tablet Take 1 tablet (5 mg total) by mouth 2 (two) times daily.   eszopiclone 3 MG TABS Take 1 tablet (3 mg total) by mouth at bedtime. Take immediately before bedtime   fentaNYL (DURAGESIC) 50 MCG/HR Place 1 patch onto the skin every 3 (three) days.   FLUoxetine (PROZAC) 20 MG/5ML solution Place 60 mg into feeding tube daily.   furosemide (LASIX) 40 MG tablet Place 40 mg into feeding tube 2 (two) times daily. 9 am   ipratropium-albuterol (DUONEB) 0.5-2.5 (3) MG/3ML SOLN Take 3 mLs by nebulization every 6 (six) hours as needed.   LINZESS 145 MCG CAPS capsule Place 145 mcg into feeding tube daily as needed (constipation).   loperamide HCl (IMODIUM) 1 MG/7.5ML suspension Place 2 mg into feeding tube 4 (four) times daily as needed for diarrhea or loose stools.   meclizine (ANTIVERT) 25 MG tablet Place 25 mg into feeding tube 3 (three) times daily as needed for dizziness.   naloxone (NARCAN) nasal spray 4 mg/0.1 mL Special Instructions: As needed for depressed respirations less than 8/minute. Notify provider if given. If no effectiveness or improvement, contact 911. As Needed   nitroGLYCERIN (NITROSTAT) 0.4 MG SL tablet Place 1 tablet (0.4 mg total) under the tongue every 5 (five) minutes as needed for chest pain (up to 3 doses).   Nutritional Supplements (FEEDING SUPPLEMENT, JEVITY 1.2 CAL,) LIQD Place into feeding tube as directed. 50 cc/hr Every Shift; Day, Evening, Night   Omeprazole-Sodium Bicarbonate (KONVOMEP) 2-84 MG/ML SUSR Give 10 mLs by tube in the morning and at bedtime.   potassium chloride 20 MEQ/15ML (10%) SOLN Take 20 mEq by mouth daily. For potassium supplement   rOPINIRole (REQUIP) 0.5 MG tablet Place 0.5 mg into feeding tube in the morning and at bedtime.   rosuvastatin (CRESTOR) 10 MG tablet Place 10 mg into feeding  tube daily. 9 am   No facility-administered encounter medications on file as of 07/17/2022.     SIGNIFICANT DIAGNOSTIC EXAMS  PREVIOUS   02-23-22: TEE:  The left ventricular size is normal.  Mild left ventricular hypertrophy  LV ejection fraction = 45-50%.  Left ventricular systolic function is mildly reduced.  The right ventricle is normal in size and function.  The left atrium is moderately to severely dilated.  The right atrium is mildly to moderately dilated.  Estimated right ventricular systolic pressure is 27 mmHg.  IVC size was normal.  There is no significant valvular stenosis or regurgitation.  There is no pericardial effusion.  There is a pleural effusion present.  Recommend a limited TTE when HR is controlled to reassess   05-23-22: chest x-ray: mild congestive heart failure  07-14-22: chest x-ray:  Changes of bibasilar atelectasis  Nonspecific perihilar inflammatory changes Bilateral spinal fusion hardware   NO NEW EXAMS.   LABS REVIEWED: PREVIOUS   02-27-22: glucose 102; bun 13; creat 0.52; k+ 4.8; na++ 134; ca 8.9; GFR>90 04-08-22: wbc 7.2 hgb 7.2; hct 11.9; mcv 35.2 plt 185; glucose 114; bun 11; creat 0.37; k+ 4.5; na++ 132; ca 8.6; GFR >90 05-21-22: glucose 113; bun 19; creat 0.5; k+ 4.2; na++ 135; ca 8.5 gfr>60; protein 5.2; albumin 2.4  05-23-22: wbc 5.4; hgb 9.2; hct 29.6 plt 184; glucose 123; bun 21; creat 0.54;  k+ 3.8; na++ 8.2; ca 8.2 gfr >60 07-01-22: glucose 91; bun 16; creat 0.44; k+ 4.1; na++ 135; ca 8.6; gfr>60 07-14-22: wbc 9.9; hgb 10.8; hct 35.3; mcv 83.8 plt 283; glucose 168; bun 24; creat 0.52; k+ 4.2; na++ 133; ca 8.6 gfr >60 urine culture: gram neg rods; blood culture: klebsiella pneumoniae  07-15-22: wbc 7.1; hgb 9.4; hct 31.2; mcv 85.2 plt 246; glucose 134; bun 28; creat 0.52; k+ 4.6; na++ 135; ca 8.2; gfr >60; protein 6.2 albumin 2.5; ast 50 alt 85 alk phos 283   NO NEW LABS.   Review of Systems  Constitutional:  Negative for malaise/fatigue.   Respiratory:  Negative for cough and shortness of breath.   Cardiovascular:  Negative for chest pain, palpitations and leg swelling.  Gastrointestinal:  Positive for nausea and vomiting. Negative for abdominal pain, constipation and heartburn.  Musculoskeletal:  Negative for back pain, joint pain and myalgias.  Skin: Negative.   Neurological:  Negative for dizziness.  Psychiatric/Behavioral:  The patient is not nervous/anxious.     Physical Exam Constitutional:      General: She is not in acute distress.    Appearance: She is well-developed. She is not diaphoretic.  Neck:     Thyroid: No thyromegaly.  Cardiovascular:     Rate and Rhythm: Normal rate and regular rhythm.     Pulses: Normal pulses.     Heart sounds: Normal heart sounds.  Pulmonary:     Effort: Pulmonary effort is normal. No respiratory distress.     Breath sounds: Normal breath sounds.  Abdominal:     General: Bowel sounds are normal. There is no distension.     Palpations: Abdomen is soft.     Tenderness: There is no abdominal tenderness.     Comments: Peg tube   Musculoskeletal:        General: Normal range of motion.     Cervical back: Neck supple.     Right lower leg: No edema.     Left lower leg: No edema.  Lymphadenopathy:     Cervical: No cervical adenopathy.  Skin:    General: Skin is warm and dry.  Neurological:     Mental Status: She is alert and oriented to person, place, and time.  Psychiatric:        Mood and Affect: Mood normal.       ASSESSMENT/ PLAN:  TODAY  Aortic atherosclerosis  Chronic diastolic congestive heart failure (CHF) Permanent atrial fibrillation Major depression chronic recurrent  Will the timing of her tube feeding. Will order roxanol 5 mg every 2 hours as needed to keep her comfort; will transition to comfort care.  Will continue current plan of care Will continue to monitor her status.   Time spent with patient: 40 minutes (25 minutes spent with advanced  directives) MOST form has been filled out.    Ok Edwards NP [facility]  call [phone]

## 2022-07-19 ENCOUNTER — Encounter (HOSPITAL_COMMUNITY)
Admission: RE | Admit: 2022-07-19 | Discharge: 2022-07-19 | Disposition: A | Discharge status: Home / Self Care | Payer: Medicare HMO | Source: Skilled Nursing Facility | Attending: Adult Health | Admitting: Adult Health

## 2022-07-19 DIAGNOSIS — R051 Acute cough: Secondary | ICD-10-CM | POA: Insufficient documentation

## 2022-07-19 DIAGNOSIS — B961 Klebsiella pneumoniae [K. pneumoniae] as the cause of diseases classified elsewhere: Secondary | ICD-10-CM | POA: Diagnosis present

## 2022-07-19 LAB — CULTURE, BLOOD (ROUTINE X 2): Culture: NO GROWTH

## 2022-07-20 ENCOUNTER — Other Ambulatory Visit (HOSPITAL_COMMUNITY)
Admission: RE | Admit: 2022-07-20 | Discharge: 2022-07-20 | Disposition: A | Discharge status: Home / Self Care | Payer: Medicare HMO | Source: Skilled Nursing Facility | Attending: Adult Health | Admitting: Adult Health

## 2022-07-20 ENCOUNTER — Non-Acute Institutional Stay (SKILLED_NURSING_FACILITY): Payer: Medicare HMO | Admitting: Adult Health

## 2022-07-20 ENCOUNTER — Encounter: Payer: Self-pay | Admitting: Adult Health

## 2022-07-20 ENCOUNTER — Other Ambulatory Visit: Payer: Self-pay | Admitting: Adult Health

## 2022-07-20 DIAGNOSIS — J189 Pneumonia, unspecified organism: Secondary | ICD-10-CM

## 2022-07-20 DIAGNOSIS — A419 Sepsis, unspecified organism: Secondary | ICD-10-CM | POA: Diagnosis present

## 2022-07-20 LAB — CBC WITH DIFFERENTIAL/PLATELET
Abs Immature Granulocytes: 0.02 10*3/uL (ref 0.00–0.07)
Basophils Absolute: 0 10*3/uL (ref 0.0–0.1)
Basophils Relative: 1 %
Eosinophils Absolute: 0.5 10*3/uL (ref 0.0–0.5)
Eosinophils Relative: 8 %
HCT: 33.2 % — ABNORMAL LOW (ref 36.0–46.0)
Hemoglobin: 9.9 g/dL — ABNORMAL LOW (ref 12.0–15.0)
Immature Granulocytes: 0 %
Lymphocytes Relative: 28 %
Lymphs Abs: 1.7 10*3/uL (ref 0.7–4.0)
MCH: 25.3 pg — ABNORMAL LOW (ref 26.0–34.0)
MCHC: 29.8 g/dL — ABNORMAL LOW (ref 30.0–36.0)
MCV: 84.9 fL (ref 80.0–100.0)
Monocytes Absolute: 0.6 10*3/uL (ref 0.1–1.0)
Monocytes Relative: 10 %
Neutro Abs: 3.3 10*3/uL (ref 1.7–7.7)
Neutrophils Relative %: 53 %
Platelets: 270 10*3/uL (ref 150–400)
RBC: 3.91 MIL/uL (ref 3.87–5.11)
RDW: 15.5 % (ref 11.5–15.5)
WBC: 6.2 10*3/uL (ref 4.0–10.5)
nRBC: 0 % (ref 0.0–0.2)

## 2022-07-20 LAB — COMPREHENSIVE METABOLIC PANEL
ALT: 65 U/L — ABNORMAL HIGH (ref 0–44)
AST: 31 U/L (ref 15–41)
Albumin: 2.4 g/dL — ABNORMAL LOW (ref 3.5–5.0)
Alkaline Phosphatase: 286 U/L — ABNORMAL HIGH (ref 38–126)
Anion gap: 6 (ref 5–15)
BUN: 25 mg/dL — ABNORMAL HIGH (ref 8–23)
CO2: 33 mmol/L — ABNORMAL HIGH (ref 22–32)
Calcium: 8.5 mg/dL — ABNORMAL LOW (ref 8.9–10.3)
Chloride: 96 mmol/L — ABNORMAL LOW (ref 98–111)
Creatinine, Ser: 0.56 mg/dL (ref 0.44–1.00)
GFR, Estimated: >60 mL/min (ref >60)
Glucose, Bld: 121 mg/dL — ABNORMAL HIGH (ref 70–99)
Potassium: 3.6 mmol/L (ref 3.5–5.1)
Sodium: 135 mmol/L (ref 135–145)
Total Bilirubin: 0.5 mg/dL (ref 0.3–1.2)
Total Protein: 6.3 g/dL — ABNORMAL LOW (ref 6.5–8.1)

## 2022-07-20 MED ORDER — FENTANYL 50 MCG/HR TD PT72: 1.0000 | MEDICATED_PATCH | TRANSDERMAL | 0 refills | Status: DC

## 2022-07-20 NOTE — Progress Notes (Unsigned)
Location:  [place] Room Number: S/159/P Place of Service:  SNF (31) [name] [name], [name] Bougie, NP  CODE STATUS: DNR  Allergies  Allergen Reactions   Buprenorphine Hcl Itching   Cymbalta [Duloxetine Hcl] Other (See Comments)    Other reaction(s): Other (See Comments) Personality changes - crying  2014   Morphine And Related Itching   Oxycodone-Acetaminophen Other (See Comments)    Personality changes    Valium [Diazepam] Other (See Comments)    Personality changes - "in another world" ;  Hallucinations  2015   Statins Other (See Comments)    Other reaction(s): Other (See Comments) Muscle weakness Muscle weakness   Morphine Itching   Altace [Ramipril] Other (See Comments)    unknown   Floxin [Ofloxacin] Other (See Comments)    unknown   Hydromorphone Other (See Comments)    Other reaction(s): Confusion (intolerance) unknown   Lovaza [Omega-3-Acid Ethyl Esters] Other (See Comments)    Muscle weakness    Trilipix [Choline Fenofibrate] Other (See Comments)    Muscle weakness    Zetia [Ezetimibe] Other (See Comments)    Muscle weakness     Chief Complaint  Patient presents with   Acute Visit    Patient is here for a F/U on status    HPI:  She is being seen for the follow up of her overall status. Her chest x-ray does not demonstrate any improvement. She continues to cough, loose; nonproductive. Has shortness of breath present. States does feel some better. There are no reports of fevers present.   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 Abnl nuc 11/2014. Cath 12/2014 - turned down for CABG. Ultimately s/p TTVP, rotational atherectomy, PTCA and stenting of the ostial LCx and left main into the LAD (crush technique), and IVUS of the  LAD/Left main. // b. Myoview 11/17: EF 48, poor quality/significant artifact; inf-lateral, inferior ischemia; Intermediate Risk   Chronic atrial fibrillation ([place])    a.  First noted post-op 9/15 spinal fusion.  She had cardioversion, not on anticoagulation. Fall risk, unsteady. // failed DCCV // Holter 10/17: AFib, Avg HR 97, PVCs, no other arrhythmia   Chronic back pain greater than 3 months duration    a. spinal stenosis.  Spinal fusion with rods in 2/15 at [facility] spinal fusion 9/15.   Chronic diastolic CHF    Echo 8/33:  EF 50-55%, trivial AI, midl MR, mod LAE, PASP 37 mmHg // Echocardiogram 03/2020: EF 45-50, no RWMA, mild LVH, mild reduced RVSF, mildly elevated PASP (RVSP 38.3), mild LAE, mild MR, mild to mod TR, trivial AI // Echo 9/21: EF 45, mild LVH, mildly reduced RV SF, moderate LAE, mild RAE, mild MR, mild AI      Circadian rhythm sleep disorder    CTS (carpal tunnel syndrome)    Deficiency anemia 11/10/2014   Depression    Diarrhea    Diverticulitis of colon    Esophageal stricture    Gastritis    Gastroesophageal hernia 07/06/2013   GERD (gastroesophageal reflux disease)    Hiatal hernia    History of blood transfusion    "most of them related to OR's"    HTN (hypertension)    Hyperlipidemia    IBS (irritable  bowel syndrome)    Incontinence 10/13/2012   Insomnia    Ischemic chest pain ([place])    Memory disorder 60/09/9322   Metabolic syndrome 55/73/2202   MGUS (monoclonal gammopathy of unknown significance) dx'd 11/2014   a. Neg BMB 11/2014.   Multiple falls    Obesity    Obstructive sleep apnea    "have mask; don't wear it" (01/07/2015)   Orthostasis    PAT (paroxysmal atrial tachycardia) ([place])    Personal history of colonic polyps 10/25/2011 & 12/02/11   not retrieved Dr Lyla Son & tubular adenomas   Pneumonia 03/2014   Sinus bradycardia    a. Baseline HR 50s-60s.   Stroke [facility]) early 2000's   "small"; denies residual on 01/07/2015)    Past  Surgical History:  Procedure Laterality Date   BACK SURGERY     BONE MARROW BIOPSY  11/2014   CARDIAC CATHETERIZATION  12/27/2014   Procedure: INTRAVASCULAR PRESSURE WIRE/FFR STUDY;  Surgeon: Larey Dresser, MD;  Location: [facility] CATH LAB;  Service: Cardiovascular;;   CARDIAC CATHETERIZATION N/A 11/25/2016   Procedure: Left Heart Cath and Coronary Angiography;  Surgeon: Sherren Mocha, MD;  Location: [place] CV LAB;  Service: Cardiovascular;  Laterality: N/A;   CARDIOVERSION N/A 02/13/2016   Procedure: CARDIOVERSION;  Surgeon: Josue Hector, MD;  Location: [place];  Service: Cardiovascular;  Laterality: N/A;   CARPAL TUNNEL RELEASE Right 1980's   CATARACT EXTRACTION Bilateral    CATARACT EXTRACTION W/ INTRAOCULAR LENS  IMPLANT, BILATERAL  2000's   CORONARY ANGIOPLASTY WITH STENT PLACEMENT  01/07/2015   "2"   CORONARY STENT PLACEMENT     DILATION AND CURETTAGE OF UTERUS     ESOPHAGOGASTRODUODENOSCOPY (EGD) WITH ESOPHAGEAL DILATION  "several times"   JOINT REPLACEMENT     KNEE ARTHROSCOPY Left 1995   LAPAROSCOPIC CHOLECYSTECTOMY  2003   LEFT HEART CATHETERIZATION WITH CORONARY ANGIOGRAM N/A 12/27/2014   Procedure: LEFT HEART CATHETERIZATION WITH CORONARY ANGIOGRAM;  Surgeon: Larey Dresser, MD;  Location: [facility] CATH LAB;  Service: Cardiovascular;  Laterality: N/A;   PERCUTANEOUS CORONARY ROTOBLATOR INTERVENTION (PCI-R)  01/07/2015   PERCUTANEOUS CORONARY ROTOBLATOR INTERVENTION (PCI-R) N/A 01/07/2015   Procedure: PERCUTANEOUS CORONARY ROTOBLATOR INTERVENTION (PCI-R);  Surgeon: Blane Ohara, MD;  Location: [facility] CATH LAB;  Service: Cardiovascular;  Laterality: N/A;   POSTERIOR FUSION THORACIC SPINE  08/2015   POSTERIOR LUMBAR FUSION  01/2015   TOTAL KNEE ARTHROPLASTY Bilateral 1990's - 2000's    Social History   Socioeconomic History   Marital status: Widowed    Spouse name: Not on file   Number of children: 2   Years of education: HS   Highest education level: Not on file   Occupational History   Occupation: Licensed conveyancer- retired    Comment: retired  Tobacco Use   Smoking status: Never   Smokeless tobacco: Never  Vaping Use   Vaping Use: Never used  Substance and Sexual Activity   Alcohol use: No    Comment: 01/07/2015 "glass of wine at Christmas, maybe"   Drug use: No   Sexual activity: Not Currently  Other Topics Concern   Not on file  Social History Narrative   Patient is right handed   Patient drinks 1-2 sodas daily.   patlient lives alone.   Social Determinants of Health   Financial Resource Strain: Not on file  Food Insecurity: Not on file  Transportation Needs: Not on file  Physical Activity: Inactive (04/04/2020)   Exercise Vital Sign    Days  of Exercise per Week: 0 days    Minutes of Exercise per Session: 0 min  Stress: Stress Concern Present (04/04/2020)   [place]    Feeling of Stress : To some extent  Social Connections: Socially Isolated (04/04/2020)   Social Connection and Isolation Panel [NHANES]    Frequency of Communication with Friends and Family: Three times a week    Frequency of Social Gatherings with Friends and Family: Once a week    Attends Religious Services: Never    Marine scientist or Organizations: No    Attends Archivist Meetings: Never    Marital Status: Widowed  Human resources officer Violence: Not on file   Family History  Problem Relation Age of Onset   Coronary artery disease Father    Peripheral vascular disease Father    Coronary artery disease Mother    Coronary artery disease Brother    Colon cancer Sister    Emphysema Sister    Sleep apnea Son    Colon cancer Sister        spread to her brain   Emphysema Brother    Dementia Neg Hx       VITAL SIGNS BP (!) 113/57   Pulse 70   Temp 98 F (36.7 C)   Resp 19   Ht _0  (1.676 m)   Wt 174 lb (78.9 kg)   SpO2 92%   BMI 28.08 kg/m   Outpatient Encounter  Medications as of 07/20/2022  Medication Sig   acetaminophen (TYLENOL) 160 MG/5ML liquid Take 20 mLs by mouth every 8 (eight) hours as needed for fever.   albuterol (VENTOLIN HFA) 108 (90 Base) MCG/ACT inhaler Inhale 2 puffs into the lungs every 6 (six) hours as needed for wheezing or shortness of breath.   Amino Acids-Protein Hydrolys (PRO-STAT PO) Take 30 mLs by mouth 3 (three) times daily. In tube   aspirin EC 81 MG tablet Take 81 mg by mouth daily. Swallow whole.   Balsam [place]-Castor Oil (VENELEX) OINT Apply 1 application. topically daily.   busPIRone (BUSPAR) 5 MG tablet Take 5 mg by mouth 2 (two) times daily.   Cefepime HCl 2 GM/100ML SOLN Inject 2 g into the vein every 8 (eight) hours.   Cholecalciferol 25 MCG (1000 UT) tablet Place 1,000 Units into feeding tube daily. 9 am   clobetasol (TEMOVATE) 0.05 % external solution Apply 1 application. topically 2 (two) times a week. Monday and Friday   diclofenac Sodium (VOLTAREN) 1 % GEL Apply 2 g topically 3 (three) times daily. to neck and shoulders for pain management   diltiazem (CARDIZEM) 120 MG tablet Take 120 mg by mouth every 8 (eight) hours.   diphenoxylate-atropine (LOMOTIL) 2.5-0.025 MG/5ML liquid Place 5 mLs into feeding tube 3 (three) times daily as needed for diarrhea or loose stools.   ELIQUIS 5 MG TABS tablet Take 1 tablet (5 mg total) by mouth 2 (two) times daily.   eszopiclone 3 MG TABS Take 1 tablet (3 mg total) by mouth at bedtime. Take immediately before bedtime   fentaNYL (DURAGESIC) 50 MCG/HR Place 1 patch onto the skin every 3 (three) days.   FLUoxetine (PROZAC) 20 MG/5ML solution Place 60 mg into feeding tube daily.   furosemide (LASIX) 40 MG tablet Place 40 mg into feeding tube 2 (two) times daily. 9 am   ipratropium-albuterol (DUONEB) 0.5-2.5 (3) MG/3ML SOLN Take 3 mLs by nebulization every 6 (six) hours as needed.  LINZESS 145 MCG CAPS capsule Place 145 mcg into feeding tube daily as needed (constipation).   loperamide  HCl (IMODIUM) 1 MG/7.5ML suspension Place 2 mg into feeding tube 4 (four) times daily as needed for diarrhea or loose stools.   meclizine (ANTIVERT) 25 MG tablet Place 25 mg into feeding tube 3 (three) times daily as needed for dizziness.   naloxone (NARCAN) nasal spray 4 mg/0.1 mL Special Instructions: As needed for depressed respirations less than 8/minute. Notify provider if given. If no effectiveness or improvement, contact 911. As Needed   nitroGLYCERIN (NITROSTAT) 0.4 MG SL tablet Place 1 tablet (0.4 mg total) under the tongue every 5 (five) minutes as needed for chest pain (up to 3 doses).   Omeprazole-Sodium Bicarbonate (KONVOMEP) 2-84 MG/ML SUSR Give 10 mLs by tube in the morning and at bedtime.   potassium chloride 20 MEQ/15ML (10%) SOLN Take 20 mEq by mouth daily. For potassium supplement   rOPINIRole (REQUIP) 0.5 MG tablet Place 0.5 mg into feeding tube in the morning and at bedtime.   rosuvastatin (CRESTOR) 10 MG tablet Place 10 mg into feeding tube daily. 9 am   No facility-administered encounter medications on file as of 07/20/2022.     SIGNIFICANT DIAGNOSTIC EXAMS  PREVIOUS   02-23-22: TEE:  The left ventricular size is normal.  Mild left ventricular hypertrophy  LV ejection fraction = 45-50%.  Left ventricular systolic function is mildly reduced.  The right ventricle is normal in size and function.  The left atrium is moderately to severely dilated.  The right atrium is mildly to moderately dilated.  Estimated right ventricular systolic pressure is 27 mmHg.  IVC size was normal.  There is no significant valvular stenosis or regurgitation.  There is no pericardial effusion.  There is a pleural effusion present.  Recommend a limited TTE when HR is controlled to reassess   05-23-22: chest x-ray: mild congestive heart failure  07-14-22: chest x-ray:  Changes of bibasilar atelectasis  Nonspecific perihilar inflammatory changes Bilateral spinal fusion hardware    TODAY  07-20-22: chest x-ray:  Nonspecific perihilar changes Emphysema No improvement compared to 07-14-22.   LABS REVIEWED: PREVIOUS   02-27-22: glucose 102; bun 13; creat 0.52; k+ 4.8; na++ 134; ca 8.9; GFR>90 04-08-22: wbc 7.2 hgb 7.2; hct 11.9; mcv 35.2 plt 185; glucose 114; bun 11; creat 0.37; k+ 4.5; na++ 132; ca 8.6; GFR >90 05-21-22: glucose 113; bun 19; creat 0.5; k+ 4.2; na++ 135; ca 8.5 gfr>60; protein 5.2; albumin 2.4  05-23-22: wbc 5.4; hgb 9.2; hct 29.6 plt 184; glucose 123; bun 21; creat 0.54; k+ 3.8; na++ 8.2; ca 8.2 gfr >60 07-01-22: glucose 91; bun 16; creat 0.44; k+ 4.1; na++ 135; ca 8.6; gfr>60 07-14-22: wbc 9.9; hgb 10.8; hct 35.3; mcv 83.8 plt 283; glucose 168; bun 24; creat 0.52; k+ 4.2; na++ 133; ca 8.6 gfr >60 urine culture: gram neg rods; blood culture: klebsiella pneumoniae  07-15-22: wbc 7.1; hgb 9.4; hct 31.2; mcv 85.2 plt 246; glucose 134; bun 28; creat 0.52; k+ 4.6; na++ 135; ca 8.2; gfr >60; protein 6.2 albumin 2.5; ast 50 alt 85 alk phos 283   TODAY  07-19-22: blood culture: no growth  07-20-22: wbc 6.2; hgb 9.9; hct 33.2; mcv 84.9 plt 270; glucose 121; bun 24; creat 0.56; k+ 3.6; na++ 135; ca 8.5 gfr >60; protein 6.3 albumin 2.4; ast 31; alt 65; alk phos 286  Review of Systems  Constitutional:  Negative for malaise/fatigue.  Respiratory:  Negative for cough  and shortness of breath.   Cardiovascular:  Negative for chest pain, palpitations and leg swelling.  Gastrointestinal:  Positive for diarrhea and nausea. Negative for abdominal pain, blood in stool and heartburn.       Stools are "water"  Musculoskeletal:  Negative for back pain, joint pain and myalgias.  Skin: Negative.   Neurological:  Negative for dizziness.  Psychiatric/Behavioral:  The patient is not nervous/anxious.    Physical Exam Constitutional:      General: She is not in acute distress.    Appearance: She is well-developed. She is not diaphoretic.  Neck:     Thyroid: No thyromegaly.   Cardiovascular:     Rate and Rhythm: Normal rate and regular rhythm.     Pulses: Normal pulses.     Heart sounds: Normal heart sounds.  Pulmonary:     Effort: Pulmonary effort is normal. No respiratory distress.     Breath sounds: Rhonchi present.  Abdominal:     General: Bowel sounds are normal. There is no distension.     Palpations: Abdomen is soft.     Tenderness: There is no abdominal tenderness.     Comments: Peg tube present   Musculoskeletal:        General: Normal range of motion.     Cervical back: Neck supple.     Right lower leg: No edema.     Left lower leg: No edema.  Lymphadenopathy:     Cervical: No cervical adenopathy.  Skin:    General: Skin is warm and dry.  Neurological:     Mental Status: She is alert and oriented to person, place, and time.  Psychiatric:        Mood and Affect: Mood normal.      ASSESSMENT/ PLAN:  TODAY  HCAP (health care associated pneumonia) There is concern about mrsa: will begin doxycycline 100 mg twice daily for 5 days Will begin incentive spirometry 10 breath four times daily  Will obtain sputum culture Will obtain stool for c-diff On 07-23-22: will check cbc; cmp      Ok Edwards NP [facility]  call [phone]

## 2022-07-21 ENCOUNTER — Non-Acute Institutional Stay (SKILLED_NURSING_FACILITY): Payer: Medicare HMO | Admitting: Adult Health

## 2022-07-21 ENCOUNTER — Encounter: Payer: Self-pay | Admitting: Adult Health

## 2022-07-21 ENCOUNTER — Other Ambulatory Visit (HOSPITAL_COMMUNITY)
Admission: RE | Admit: 2022-07-21 | Discharge: 2022-07-21 | Disposition: A | Discharge status: Home / Self Care | Payer: Medicare HMO | Source: Skilled Nursing Facility | Attending: Adult Health | Admitting: Adult Health

## 2022-07-21 DIAGNOSIS — A0472 Enterocolitis due to Clostridium difficile, not specified as recurrent: Secondary | ICD-10-CM | POA: Diagnosis not present

## 2022-07-21 DIAGNOSIS — J690 Pneumonitis due to inhalation of food and vomit: Secondary | ICD-10-CM | POA: Insufficient documentation

## 2022-07-21 DIAGNOSIS — J189 Pneumonia, unspecified organism: Secondary | ICD-10-CM

## 2022-07-21 DIAGNOSIS — R197 Diarrhea, unspecified: Secondary | ICD-10-CM | POA: Insufficient documentation

## 2022-07-21 DIAGNOSIS — N390 Urinary tract infection, site not specified: Secondary | ICD-10-CM | POA: Diagnosis not present

## 2022-07-21 HISTORY — DX: Enterocolitis due to Clostridium difficile, not specified as recurrent: A04.72

## 2022-07-21 LAB — EXPECTORATED SPUTUM ASSESSMENT W GRAM STAIN, RFLX TO RESP C

## 2022-07-21 LAB — C DIFFICILE QUICK SCREEN W PCR REFLEX
C Diff antigen: POSITIVE — AB
C Diff interpretation: DETECTED
C Diff toxin: POSITIVE — AB

## 2022-07-21 NOTE — Progress Notes (Signed)
Location:  [place] Room Number: 159-P Place of Service:  SNF (31)   CODE STATUS: DNR  Allergies  Allergen Reactions   Buprenorphine Hcl Itching   Cymbalta [Duloxetine Hcl] Other (See Comments)    Other reaction(s): Other (See Comments) Personality changes - crying  2014   Morphine And Related Itching   Oxycodone-Acetaminophen Other (See Comments)    Personality changes    Valium [Diazepam] Other (See Comments)    Personality changes - "in another world" ;  Hallucinations  2015   Statins Other (See Comments)    Other reaction(s): Other (See Comments) Muscle weakness Muscle weakness   Morphine Itching   Altace [Ramipril] Other (See Comments)    unknown   Floxin [Ofloxacin] Other (See Comments)    unknown   Hydromorphone Other (See Comments)    Other reaction(s): Confusion (intolerance) unknown   Lovaza [Omega-3-Acid Ethyl Esters] Other (See Comments)    Muscle weakness    Trilipix [Choline Fenofibrate] Other (See Comments)    Muscle weakness    Zetia [Ezetimibe] Other (See Comments)    Muscle weakness     Chief Complaint  Patient presents with   Acute Visit    Positive C-diff    HPI:  She has been treated with cefepime for positive blood cultures; positive urine culture and hcap. There has been concern for mrsa and was started on doxycycline for questionable mrsa. She began complaining of loose stools which are water consistency. She denies any abdominal pain. There are no reports of foul odor. She was tested for c-diff which has was positive. There are no reports of fever present.   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 Abnl nuc 11/2014. Cath 12/2014 - turned down for CABG. Ultimately s/p TTVP, rotational  atherectomy, PTCA and stenting of the ostial LCx and left main into the LAD (crush technique), and IVUS of the LAD/Left main. // b. Myoview 11/17: EF 48, poor quality/significant artifact; inf-lateral, inferior ischemia; Intermediate Risk   Chronic atrial fibrillation ([place])    a.  First noted post-op 9/15 spinal fusion.  She had cardioversion, not on anticoagulation. Fall risk, unsteady. // failed DCCV // Holter 10/17: AFib, Avg HR 97, PVCs, no other arrhythmia   Chronic back pain greater than 3 months duration    a. spinal stenosis.  Spinal fusion with rods in 2/15 at [facility] spinal fusion 9/15.   Chronic diastolic CHF    Echo 2/57:  EF 50-55%, trivial AI, midl MR, mod LAE, PASP 37 mmHg // Echocardiogram 03/2020: EF 45-50, no RWMA, mild LVH, mild reduced RVSF, mildly elevated PASP (RVSP 38.3), mild LAE, mild MR, mild to mod TR, trivial AI // Echo 9/21: EF 45, mild LVH, mildly reduced RV SF, moderate LAE, mild RAE, mild MR, mild AI      Circadian rhythm sleep disorder    CTS (carpal tunnel syndrome)    Deficiency anemia 11/10/2014   Depression    Diarrhea    Diverticulitis of colon    Esophageal stricture    Gastritis    Gastroesophageal hernia 07/06/2013   GERD (gastroesophageal reflux disease)    Hiatal hernia    History of blood transfusion    "most of them related to  OR's"    HTN (hypertension)    Hyperlipidemia    IBS (irritable bowel syndrome)    Incontinence 10/13/2012   Insomnia    Ischemic chest pain ([place])    Memory disorder 44/31/5400   Metabolic syndrome 86/76/1950   MGUS (monoclonal gammopathy of unknown significance) dx'd 11/2014   a. Neg BMB 11/2014.   Multiple falls    Obesity    Obstructive sleep apnea    "have mask; don't wear it" (01/07/2015)   Orthostasis    PAT (paroxysmal atrial tachycardia) ([place])    Personal history of colonic polyps 10/25/2011 & 12/02/11   not retrieved Dr Lyla Son & tubular adenomas   Pneumonia 03/2014   Sinus bradycardia     a. Baseline HR 50s-60s.   Stroke [facility]) early 2000's   "small"; denies residual on 01/07/2015)    Past Surgical History:  Procedure Laterality Date   BACK SURGERY     BONE MARROW BIOPSY  11/2014   CARDIAC CATHETERIZATION  12/27/2014   Procedure: INTRAVASCULAR PRESSURE WIRE/FFR STUDY;  Surgeon: Larey Dresser, MD;  Location: [facility] CATH LAB;  Service: Cardiovascular;;   CARDIAC CATHETERIZATION N/A 11/25/2016   Procedure: Left Heart Cath and Coronary Angiography;  Surgeon: Sherren Mocha, MD;  Location: [place] CV LAB;  Service: Cardiovascular;  Laterality: N/A;   CARDIOVERSION N/A 02/13/2016   Procedure: CARDIOVERSION;  Surgeon: Josue Hector, MD;  Location: [place];  Service: Cardiovascular;  Laterality: N/A;   CARPAL TUNNEL RELEASE Right 1980's   CATARACT EXTRACTION Bilateral    CATARACT EXTRACTION W/ INTRAOCULAR LENS  IMPLANT, BILATERAL  2000's   CORONARY ANGIOPLASTY WITH STENT PLACEMENT  01/07/2015   "2"   CORONARY STENT PLACEMENT     DILATION AND CURETTAGE OF UTERUS     ESOPHAGOGASTRODUODENOSCOPY (EGD) WITH ESOPHAGEAL DILATION  "several times"   JOINT REPLACEMENT     KNEE ARTHROSCOPY Left 1995   LAPAROSCOPIC CHOLECYSTECTOMY  2003   LEFT HEART CATHETERIZATION WITH CORONARY ANGIOGRAM N/A 12/27/2014   Procedure: LEFT HEART CATHETERIZATION WITH CORONARY ANGIOGRAM;  Surgeon: Larey Dresser, MD;  Location: [facility] CATH LAB;  Service: Cardiovascular;  Laterality: N/A;   PERCUTANEOUS CORONARY ROTOBLATOR INTERVENTION (PCI-R)  01/07/2015   PERCUTANEOUS CORONARY ROTOBLATOR INTERVENTION (PCI-R) N/A 01/07/2015   Procedure: PERCUTANEOUS CORONARY ROTOBLATOR INTERVENTION (PCI-R);  Surgeon: Blane Ohara, MD;  Location: [facility] CATH LAB;  Service: Cardiovascular;  Laterality: N/A;   POSTERIOR FUSION THORACIC SPINE  08/2015   POSTERIOR LUMBAR FUSION  01/2015   TOTAL KNEE ARTHROPLASTY Bilateral 1990's - 2000's    Social History   Socioeconomic History   Marital status: Widowed    Spouse name: Not on  file   Number of children: 2   Years of education: HS   Highest education level: Not on file  Occupational History   Occupation: Licensed conveyancer- retired    Comment: retired  Tobacco Use   Smoking status: Never   Smokeless tobacco: Never  Vaping Use   Vaping Use: Never used  Substance and Sexual Activity   Alcohol use: No    Comment: 01/07/2015 "glass of wine at Christmas, maybe"   Drug use: No   Sexual activity: Not Currently  Other Topics Concern   Not on file  Social History Narrative   Patient is right handed   Patient drinks 1-2 sodas daily.   patlient lives alone.   Social Determinants of Health   Financial Resource Strain: Not on file  Food Insecurity: Not on file  Transportation Needs: Not on  file  Physical Activity: Inactive (04/04/2020)   Exercise Vital Sign    Days of Exercise per Week: 0 days    Minutes of Exercise per Session: 0 min  Stress: Stress Concern Present (04/04/2020)   False Pass    Feeling of Stress : To some extent  Social Connections: Socially Isolated (04/04/2020)   Social Connection and Isolation Panel [NHANES]    Frequency of Communication with Friends and Family: Three times a week    Frequency of Social Gatherings with Friends and Family: Once a week    Attends Religious Services: Never    Marine scientist or Organizations: No    Attends Archivist Meetings: Never    Marital Status: Widowed  Human resources officer Violence: Not on file   Family History  Problem Relation Age of Onset   Coronary artery disease Father    Peripheral vascular disease Father    Coronary artery disease Mother    Coronary artery disease Brother    Colon cancer Sister    Emphysema Sister    Sleep apnea Son    Colon cancer Sister        spread to her brain   Emphysema Brother    Dementia Neg Hx       VITAL SIGNS BP (!) 113/57   Pulse 70   Ht _0  (1.676 m)   Wt 174 lb (78.9 kg)    BMI 28.08 kg/m   Outpatient Encounter Medications as of 07/21/2022  Medication Sig   acetaminophen (TYLENOL) 160 MG/5ML liquid Take 20 mLs by mouth every 8 (eight) hours as needed for fever.   albuterol (VENTOLIN HFA) 108 (90 Base) MCG/ACT inhaler Inhale 2 puffs into the lungs every 6 (six) hours as needed for wheezing or shortness of breath.   Amino Acids-Protein Hydrolys (PRO-STAT PO) Take 30 mLs by mouth 3 (three) times daily. In tube   aspirin EC 81 MG tablet Take 81 mg by mouth daily. Swallow whole.   Balsam [place]-Castor Oil (VENELEX) OINT Apply 1 application. topically daily.   busPIRone (BUSPAR) 7.5 MG tablet Take 7.5 mg by mouth 3 (three) times daily.   Cholecalciferol 25 MCG (1000 UT) tablet Place 1,000 Units into feeding tube daily. 9 am   clobetasol (TEMOVATE) 0.05 % external solution Apply 1 application. topically 2 (two) times a week. Monday and Friday   diclofenac Sodium (VOLTAREN) 1 % GEL Apply 2 g topically 3 (three) times daily. to neck and shoulders for pain management   diltiazem (CARDIZEM) 120 MG tablet Take 120 mg by mouth every 8 (eight) hours.   ELIQUIS 5 MG TABS tablet Take 1 tablet (5 mg total) by mouth 2 (two) times daily.   eszopiclone 3 MG TABS Take 1 tablet (3 mg total) by mouth at bedtime. Take immediately before bedtime   fentaNYL (DURAGESIC) 50 MCG/HR Place 1 patch onto the skin every 3 (three) days.   FLUoxetine (PROZAC) 20 MG/5ML solution Place 60 mg into feeding tube daily.   furosemide (LASIX) 40 MG tablet Place 40 mg into feeding tube 2 (two) times daily. 9 am   ipratropium-albuterol (DUONEB) 0.5-2.5 (3) MG/3ML SOLN Take 3 mLs by nebulization every 6 (six) hours as needed.   LINZESS 145 MCG CAPS capsule Place 145 mcg into feeding tube daily as needed (constipation).   meclizine (ANTIVERT) 25 MG tablet Place 25 mg into feeding tube 3 (three) times daily as needed for dizziness.   morphine (ROXANOL)  20 MG/ML concentrated solution Take 5 mg by mouth every 2 (two)  hours as needed for severe pain.   naloxone (NARCAN) nasal spray 4 mg/0.1 mL Special Instructions: As needed for depressed respirations less than 8/minute. Notify provider if given. If no effectiveness or improvement, contact 911. As Needed   nitroGLYCERIN (NITROSTAT) 0.4 MG SL tablet Place 1 tablet (0.4 mg total) under the tongue every 5 (five) minutes as needed for chest pain (up to 3 doses).   Omeprazole-Sodium Bicarbonate (KONVOMEP) 2-84 MG/ML SUSR Give 10 mLs by tube in the morning and at bedtime.   potassium chloride 20 MEQ/15ML (10%) SOLN Take 20 mEq by mouth daily. For potassium supplement   rOPINIRole (REQUIP) 0.5 MG tablet Place 0.5 mg into feeding tube in the morning and at bedtime.   rosuvastatin (CRESTOR) 10 MG tablet Place 10 mg into feeding tube daily. 9 am   Vancomycin HCl 250 MG SOLR Inject 5 mLs into the vein every 6 (six) hours.   [DISCONTINUED] busPIRone (BUSPAR) 5 MG tablet Take 5 mg by mouth 2 (two) times daily.   [DISCONTINUED] Cefepime HCl 2 GM/100ML SOLN Inject 2 g into the vein every 8 (eight) hours.   [DISCONTINUED] diphenoxylate-atropine (LOMOTIL) 2.5-0.025 MG/5ML liquid Place 5 mLs into feeding tube 3 (three) times daily as needed for diarrhea or loose stools.   [DISCONTINUED] loperamide HCl (IMODIUM) 1 MG/7.5ML suspension Place 2 mg into feeding tube 4 (four) times daily as needed for diarrhea or loose stools.   No facility-administered encounter medications on file as of 07/21/2022.     SIGNIFICANT DIAGNOSTIC EXAMS  PREVIOUS   02-23-22: TEE:  The left ventricular size is normal.  Mild left ventricular hypertrophy  LV ejection fraction = 45-50%.  Left ventricular systolic function is mildly reduced.  The right ventricle is normal in size and function.  The left atrium is moderately to severely dilated.  The right atrium is mildly to moderately dilated.  Estimated right ventricular systolic pressure is 27 mmHg.  IVC size was normal.  There is no significant  valvular stenosis or regurgitation.  There is no pericardial effusion.  There is a pleural effusion present.  Recommend a limited TTE when HR is controlled to reassess   05-23-22: chest x-ray: mild congestive heart failure  07-14-22: chest x-ray:  Changes of bibasilar atelectasis  Nonspecific perihilar inflammatory changes Bilateral spinal fusion hardware   TODAY  07-20-22: chest x-ray:  Nonspecific perihilar changes Emphysema No improvement compared to 07-14-22.   07-21-22: chest x-ray:  No acute pulmonary process.   LABS REVIEWED: PREVIOUS   02-27-22: glucose 102; bun 13; creat 0.52; k+ 4.8; na++ 134; ca 8.9; GFR>90 04-08-22: wbc 7.2 hgb 7.2; hct 11.9; mcv 35.2 plt 185; glucose 114; bun 11; creat 0.37; k+ 4.5; na++ 132; ca 8.6; GFR >90 05-21-22: glucose 113; bun 19; creat 0.5; k+ 4.2; na++ 135; ca 8.5 gfr>60; protein 5.2; albumin 2.4  05-23-22: wbc 5.4; hgb 9.2; hct 29.6 plt 184; glucose 123; bun 21; creat 0.54; k+ 3.8; na++ 8.2; ca 8.2 gfr >60 07-01-22: glucose 91; bun 16; creat 0.44; k+ 4.1; na++ 135; ca 8.6; gfr>60 07-14-22: wbc 9.9; hgb 10.8; hct 35.3; mcv 83.8 plt 283; glucose 168; bun 24; creat 0.52; k+ 4.2; na++ 133; ca 8.6 gfr >60 urine culture: gram neg rods; blood culture: klebsiella pneumoniae  07-15-22: wbc 7.1; hgb 9.4; hct 31.2; mcv 85.2 plt 246; glucose 134; bun 28; creat 0.52; k+ 4.6; na++ 135; ca 8.2; gfr >60; protein 6.2 albumin  2.5; ast 50 alt 85 alk phos 283   TODAY  07-19-22: blood culture: no growth  07-20-22: wbc 6.2; hgb 9.9; hct 33.2; mcv 84.9 plt 270; glucose 121; bun 24; creat 0.56; k+ 3.6; na++ 135; ca 8.5 gfr >60; protein 6.3 albumin 2.4; ast 31; alt 65; alk phos 286 07-21-22: c-dff +   Review of Systems  Constitutional:  Negative for malaise/fatigue.  Respiratory:  Positive for cough. Negative for shortness of breath.   Cardiovascular:  Negative for chest pain, palpitations and leg swelling.  Gastrointestinal:  Negative for abdominal pain, constipation and  heartburn.  Musculoskeletal:  Negative for back pain, joint pain and myalgias.  Skin: Negative.   Neurological:  Negative for dizziness.  Psychiatric/Behavioral:  The patient is not nervous/anxious.    Physical Exam Constitutional:      General: She is not in acute distress.    Appearance: She is well-developed. She is not diaphoretic.  Neck:     Thyroid: No thyromegaly.  Cardiovascular:     Rate and Rhythm: Normal rate and regular rhythm.     Heart sounds: Normal heart sounds.  Pulmonary:     Effort: Pulmonary effort is normal. No respiratory distress.     Breath sounds: Rhonchi present.     Comments: Few scattered  Abdominal:     General: Bowel sounds are normal. There is no distension.     Palpations: Abdomen is soft.     Tenderness: There is no abdominal tenderness.     Comments: Peg tube present   Musculoskeletal:        General: Normal range of motion.     Cervical back: Neck supple.     Right lower leg: No edema.     Left lower leg: No edema.  Lymphadenopathy:     Cervical: No cervical adenopathy.  Skin:    General: Skin is warm and dry.  Neurological:     Mental Status: She is alert and oriented to person, place, and time.  Psychiatric:        Mood and Affect: Mood normal.        ASSESSMENT/ PLAN:  TODAY  HCAP (health associated pneumonia) Complicated UTI C-diff colitis  Will stop cefepime; doxycyline; imodium; lomotil Will begin vancomycin 125 mg every 6 hours for 10 days Will monitor her status Her daughter has been informed and is in agreement with plan of care.    Ok Edwards NP [facility]   call [phone]

## 2022-07-22 ENCOUNTER — Non-Acute Institutional Stay (SKILLED_NURSING_FACILITY): Payer: Medicare HMO | Admitting: Adult Health

## 2022-07-22 ENCOUNTER — Encounter: Payer: Self-pay | Admitting: Adult Health

## 2022-07-22 DIAGNOSIS — A0472 Enterocolitis due to Clostridium difficile, not specified as recurrent: Secondary | ICD-10-CM | POA: Diagnosis not present

## 2022-07-22 DIAGNOSIS — B370 Candidal stomatitis: Secondary | ICD-10-CM | POA: Diagnosis not present

## 2022-07-22 LAB — CULTURE, BLOOD (ROUTINE X 2): Special Requests: ADEQUATE

## 2022-07-22 NOTE — Progress Notes (Signed)
Location:  [place] Room Number: 774 Place of Service:  SNF (31) Provider:  Ok Edwards, NP   CODE STATUS: DNR  Allergies  Allergen Reactions   Buprenorphine Hcl Itching   Cymbalta [Duloxetine Hcl] Other (See Comments)    Other reaction(s): Other (See Comments) Personality changes - crying  2014   Morphine And Related Itching   Oxycodone-Acetaminophen Other (See Comments)    Personality changes    Valium [Diazepam] Other (See Comments)    Personality changes - "in another world" ;  Hallucinations  2015   Statins Other (See Comments)    Other reaction(s): Other (See Comments) Muscle weakness Muscle weakness   Morphine Itching   Altace [Ramipril] Other (See Comments)    unknown   Floxin [Ofloxacin] Other (See Comments)    unknown   Hydromorphone Other (See Comments)    Other reaction(s): Confusion (intolerance) unknown   Lovaza [Omega-3-Acid Ethyl Esters] Other (See Comments)    Muscle weakness    Trilipix [Choline Fenofibrate] Other (See Comments)    Muscle weakness    Zetia [Ezetimibe] Other (See Comments)    Muscle weakness     Chief Complaint  Patient presents with   Acute Visit    Oral thrush    HPI:  She has been with cefepime for her sepsis: due to UTI with positive blood cultures. She has developed c-diff infection and is being treated with oral vancomycin. Her sputum culture has few yeast present. She does have oral thrush present. There are no reports of fevers present. She tells me today that she is having fewer stools.   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 Abnl nuc 11/2014. Cath 12/2014 - turned down for CABG. Ultimately s/p TTVP, rotational atherectomy, PTCA and stenting of the ostial LCx and left  main into the LAD (crush technique), and IVUS of the LAD/Left main. // b. Myoview 11/17: EF 48, poor quality/significant artifact; inf-lateral, inferior ischemia; Intermediate Risk   Chronic atrial fibrillation ([place])    a.  First noted post-op 9/15 spinal fusion.  She had cardioversion, not on anticoagulation. Fall risk, unsteady. // failed DCCV // Holter 10/17: AFib, Avg HR 97, PVCs, no other arrhythmia   Chronic back pain greater than 3 months duration    a. spinal stenosis.  Spinal fusion with rods in 2/15 at [facility] spinal fusion 9/15.   Chronic diastolic CHF    Echo 1/28:  EF 50-55%, trivial AI, midl MR, mod LAE, PASP 37 mmHg // Echocardiogram 03/2020: EF 45-50, no RWMA, mild LVH, mild reduced RVSF, mildly elevated PASP (RVSP 38.3), mild LAE, mild MR, mild to mod TR, trivial AI // Echo 9/21: EF 45, mild LVH, mildly reduced RV SF, moderate LAE, mild RAE, mild MR, mild AI      Circadian rhythm sleep disorder    CTS (carpal tunnel syndrome)    Deficiency anemia 11/10/2014   Depression    Diarrhea    Diverticulitis of colon    Esophageal stricture    Gastritis    Gastroesophageal hernia 07/06/2013   GERD (gastroesophageal reflux disease)    Hiatal hernia    History of blood transfusion    "most of them related to OR's"    HTN (  hypertension)    Hyperlipidemia    IBS (irritable bowel syndrome)    Incontinence 10/13/2012   Insomnia    Ischemic chest pain ([place])    Memory disorder 60/09/9322   Metabolic syndrome 55/73/2202   MGUS (monoclonal gammopathy of unknown significance) dx'd 11/2014   a. Neg BMB 11/2014.   Multiple falls    Obesity    Obstructive sleep apnea    "have mask; don't wear it" (01/07/2015)   Orthostasis    PAT (paroxysmal atrial tachycardia) ([place])    Personal history of colonic polyps 10/25/2011 & 12/02/11   not retrieved Dr Lyla Son & tubular adenomas   Pneumonia 03/2014   Sinus bradycardia    a. Baseline HR 50s-60s.   Stroke [facility]) early 2000's    "small"; denies residual on 01/07/2015)    Past Surgical History:  Procedure Laterality Date   BACK SURGERY     BONE MARROW BIOPSY  11/2014   CARDIAC CATHETERIZATION  12/27/2014   Procedure: INTRAVASCULAR PRESSURE WIRE/FFR STUDY;  Surgeon: Larey Dresser, MD;  Location: [facility] CATH LAB;  Service: Cardiovascular;;   CARDIAC CATHETERIZATION N/A 11/25/2016   Procedure: Left Heart Cath and Coronary Angiography;  Surgeon: Sherren Mocha, MD;  Location: [facility] CV LAB;  Service: Cardiovascular;  Laterality: N/A;   CARDIOVERSION N/A 02/13/2016   Procedure: CARDIOVERSION;  Surgeon: Josue Hector, MD;  Location: [employer];  Service: Cardiovascular;  Laterality: N/A;   CARPAL TUNNEL RELEASE Right 1980's   CATARACT EXTRACTION Bilateral    CATARACT EXTRACTION W/ INTRAOCULAR LENS  IMPLANT, BILATERAL  2000's   CORONARY ANGIOPLASTY WITH STENT PLACEMENT  01/07/2015   "2"   CORONARY STENT PLACEMENT     DILATION AND CURETTAGE OF UTERUS     ESOPHAGOGASTRODUODENOSCOPY (EGD) WITH ESOPHAGEAL DILATION  "several times"   JOINT REPLACEMENT     KNEE ARTHROSCOPY Left 1995   LAPAROSCOPIC CHOLECYSTECTOMY  2003   LEFT HEART CATHETERIZATION WITH CORONARY ANGIOGRAM N/A 12/27/2014   Procedure: LEFT HEART CATHETERIZATION WITH CORONARY ANGIOGRAM;  Surgeon: Larey Dresser, MD;  Location: [facility] CATH LAB;  Service: Cardiovascular;  Laterality: N/A;   PERCUTANEOUS CORONARY ROTOBLATOR INTERVENTION (PCI-R)  01/07/2015   PERCUTANEOUS CORONARY ROTOBLATOR INTERVENTION (PCI-R) N/A 01/07/2015   Procedure: PERCUTANEOUS CORONARY ROTOBLATOR INTERVENTION (PCI-R);  Surgeon: Blane Ohara, MD;  Location: [facility] CATH LAB;  Service: Cardiovascular;  Laterality: N/A;   POSTERIOR FUSION THORACIC SPINE  08/2015   POSTERIOR LUMBAR FUSION  01/2015   TOTAL KNEE ARTHROPLASTY Bilateral 1990's - 2000's    Social History   Socioeconomic History   Marital status: Widowed    Spouse name: Not on file   Number of children: 2   Years of education: HS    Highest education level: Not on file  Occupational History   Occupation: Licensed conveyancer- retired    Comment: retired  Tobacco Use   Smoking status: Never   Smokeless tobacco: Never  Vaping Use   Vaping Use: Never used  Substance and Sexual Activity   Alcohol use: No    Comment: 01/07/2015 "glass of wine at Christmas, maybe"   Drug use: No   Sexual activity: Not Currently  Other Topics Concern   Not on file  Social History Narrative   Patient is right handed   Patient drinks 1-2 sodas daily.   patlient lives alone.   Social Determinants of Health   Financial Resource Strain: Not on file  Food Insecurity: Not on file  Transportation Needs: Not on file  Physical Activity: Inactive (  04/04/2020)   Exercise Vital Sign    Days of Exercise per Week: 0 days    Minutes of Exercise per Session: 0 min  Stress: Stress Concern Present (04/04/2020)   [place]    Feeling of Stress : To some extent  Social Connections: Socially Isolated (04/04/2020)   Social Connection and Isolation Panel [NHANES]    Frequency of Communication with Friends and Family: Three times a week    Frequency of Social Gatherings with Friends and Family: Once a week    Attends Religious Services: Never    Marine scientist or Organizations: No    Attends Archivist Meetings: Never    Marital Status: Widowed  Human resources officer Violence: Not on file   Family History  Problem Relation Age of Onset   Coronary artery disease Father    Peripheral vascular disease Father    Coronary artery disease Mother    Coronary artery disease Brother    Colon cancer Sister    Emphysema Sister    Sleep apnea Son    Colon cancer Sister        spread to her brain   Emphysema Brother    Dementia Neg Hx       VITAL SIGNS BP (!) 113/57   Pulse 70   Temp 98 F (36.7 C)   Resp 19   Ht _0  (1.676 m)   Wt 174 lb (78.9 kg)   SpO2 92%   BMI 28.08  kg/m   Outpatient Encounter Medications as of 07/22/2022  Medication Sig   acetaminophen (TYLENOL) 160 MG/5ML liquid Take 20 mLs by mouth every 8 (eight) hours as needed for fever.   albuterol (VENTOLIN HFA) 108 (90 Base) MCG/ACT inhaler Inhale 2 puffs into the lungs every 6 (six) hours as needed for wheezing or shortness of breath.   Amino Acids-Protein Hydrolys (PRO-STAT PO) Take 30 mLs by mouth 3 (three) times daily. In tube   aspirin EC 81 MG tablet Take 81 mg by mouth daily. Swallow whole.   Balsam [place]-Castor Oil (VENELEX) OINT Apply 1 application. topically daily.   busPIRone (BUSPAR) 7.5 MG tablet Take 7.5 mg by mouth 3 (three) times daily.   Cholecalciferol 25 MCG (1000 UT) tablet Place 1,000 Units into feeding tube daily. 9 am   clobetasol (TEMOVATE) 0.05 % external solution Apply 1 application. topically 2 (two) times a week. Monday and Friday   diclofenac Sodium (VOLTAREN) 1 % GEL Apply 2 g topically 3 (three) times daily. to neck and shoulders for pain management   diltiazem (CARDIZEM) 120 MG tablet Take 120 mg by mouth every 8 (eight) hours.   ELIQUIS 5 MG TABS tablet Take 1 tablet (5 mg total) by mouth 2 (two) times daily.   eszopiclone 3 MG TABS Take 1 tablet (3 mg total) by mouth at bedtime. Take immediately before bedtime   fentaNYL (DURAGESIC) 50 MCG/HR Place 1 patch onto the skin every 3 (three) days.   FLUoxetine (PROZAC) 20 MG/5ML solution Place 60 mg into feeding tube daily.   furosemide (LASIX) 40 MG tablet Place 40 mg into feeding tube 2 (two) times daily. 9 am   ipratropium-albuterol (DUONEB) 0.5-2.5 (3) MG/3ML SOLN Take 3 mLs by nebulization every 6 (six) hours as needed.   LINZESS 145 MCG CAPS capsule Place 145 mcg into feeding tube daily as needed (constipation).   meclizine (ANTIVERT) 25 MG tablet Place 25 mg into feeding tube 3 (three)  times daily as needed for dizziness.   morphine (ROXANOL) 20 MG/ML concentrated solution Take 5 mg by mouth every 2 (two) hours as  needed for severe pain.   naloxone (NARCAN) nasal spray 4 mg/0.1 mL Special Instructions: As needed for depressed respirations less than 8/minute. Notify provider if given. If no effectiveness or improvement, contact 911. As Needed   nitroGLYCERIN (NITROSTAT) 0.4 MG SL tablet Place 1 tablet (0.4 mg total) under the tongue every 5 (five) minutes as needed for chest pain (up to 3 doses).   Omeprazole-Sodium Bicarbonate (KONVOMEP) 2-84 MG/ML SUSR Give 10 mLs by tube in the morning and at bedtime.   potassium chloride 20 MEQ/15ML (10%) SOLN Take 20 mEq by mouth daily. For potassium supplement   rosuvastatin (CRESTOR) 10 MG tablet Place 10 mg into feeding tube daily. 9 am   Vancomycin HCl 250 MG SOLR Inject 5 mLs into the vein every 6 (six) hours.   rOPINIRole (REQUIP) 0.5 MG tablet Place 0.5 mg into feeding tube in the morning and at bedtime.   No facility-administered encounter medications on file as of 07/22/2022.     SIGNIFICANT DIAGNOSTIC EXAMS   PREVIOUS   02-23-22: TEE:  The left ventricular size is normal.  Mild left ventricular hypertrophy  LV ejection fraction = 45-50%.  Left ventricular systolic function is mildly reduced.  The right ventricle is normal in size and function.  The left atrium is moderately to severely dilated.  The right atrium is mildly to moderately dilated.  Estimated right ventricular systolic pressure is 27 mmHg.  IVC size was normal.  There is no significant valvular stenosis or regurgitation.  There is no pericardial effusion.  There is a pleural effusion present.  Recommend a limited TTE when HR is controlled to reassess   05-23-22: chest x-ray: mild congestive heart failure  07-14-22: chest x-ray:  Changes of bibasilar atelectasis  Nonspecific perihilar inflammatory changes Bilateral spinal fusion hardware   TODAY  07-20-22: chest x-ray:  Nonspecific perihilar changes Emphysema No improvement compared to 07-14-22.   07-21-22: chest x-ray:  No  acute pulmonary process.   LABS REVIEWED: PREVIOUS   02-27-22: glucose 102; bun 13; creat 0.52; k+ 4.8; na++ 134; ca 8.9; GFR>90 04-08-22: wbc 7.2 hgb 7.2; hct 11.9; mcv 35.2 plt 185; glucose 114; bun 11; creat 0.37; k+ 4.5; na++ 132; ca 8.6; GFR >90 05-21-22: glucose 113; bun 19; creat 0.5; k+ 4.2; na++ 135; ca 8.5 gfr>60; protein 5.2; albumin 2.4  05-23-22: wbc 5.4; hgb 9.2; hct 29.6 plt 184; glucose 123; bun 21; creat 0.54; k+ 3.8; na++ 8.2; ca 8.2 gfr >60 07-01-22: glucose 91; bun 16; creat 0.44; k+ 4.1; na++ 135; ca 8.6; gfr>60 07-14-22: wbc 9.9; hgb 10.8; hct 35.3; mcv 83.8 plt 283; glucose 168; bun 24; creat 0.52; k+ 4.2; na++ 133; ca 8.6 gfr >60 urine culture:  blood culture: klebsiella pneumoniae  07-15-22: wbc 7.1; hgb 9.4; hct 31.2; mcv 85.2 plt 246; glucose 134; bun 28; creat 0.52; k+ 4.6; na++ 135; ca 8.2; gfr >60; protein 6.2 albumin 2.5; ast 50 alt 85 alk phos 283   TODAY  07-19-22: blood culture: no growth  07-20-22: wbc 6.2; hgb 9.9; hct 33.2; mcv 84.9 plt 270; glucose 121; bun 24; creat 0.56; k+ 3.6; na++ 135; ca 8.5 gfr >60; protein 6.3 albumin 2.4; ast 31; alt 65; alk phos 286 07-21-22: c-dff +   Review of Systems  Constitutional:  Negative for malaise/fatigue.  Respiratory:  Negative for cough and shortness of  breath.   Cardiovascular:  Negative for chest pain, palpitations and leg swelling.  Gastrointestinal:  Positive for diarrhea. Negative for abdominal pain, constipation and heartburn.       Fewer stools today   Musculoskeletal:  Negative for back pain, joint pain and myalgias.  Skin: Negative.   Neurological:  Negative for dizziness.  Psychiatric/Behavioral:  The patient is not nervous/anxious.     Physical Exam Constitutional:      General: She is not in acute distress.    Appearance: She is well-developed. She is not diaphoretic.  Neck:     Thyroid: No thyromegaly.  Cardiovascular:     Rate and Rhythm: Normal rate and regular rhythm.     Pulses: Normal pulses.      Heart sounds: Normal heart sounds.  Pulmonary:     Effort: Pulmonary effort is normal. No respiratory distress.     Breath sounds: Normal breath sounds.     Comments: 02 Abdominal:     General: Bowel sounds are normal. There is no distension.     Palpations: Abdomen is soft.     Tenderness: There is no abdominal tenderness.     Comments: Peg tube present   Musculoskeletal:        General: Normal range of motion.     Cervical back: Neck supple.     Right lower leg: No edema.     Left lower leg: No edema.  Lymphadenopathy:     Cervical: No cervical adenopathy.  Skin:    General: Skin is warm and dry.  Neurological:     Mental Status: She is alert and oriented to person, place, and time.  Psychiatric:        Mood and Affect: Mood normal.       ASSESSMENT/ PLAN:  TODAY  C-diff colitis Oral thrush:   Will begin diflucan 200 mg daily for 7 days. Will monitor    Ok Edwards NP [facility]  call [phone]

## 2022-07-23 ENCOUNTER — Other Ambulatory Visit (HOSPITAL_COMMUNITY)
Admission: RE | Admit: 2022-07-23 | Discharge: 2022-07-23 | Disposition: A | Discharge status: Home / Self Care | Payer: Medicare HMO | Source: Skilled Nursing Facility | Attending: Adult Health | Admitting: Adult Health

## 2022-07-23 DIAGNOSIS — R6520 Severe sepsis without septic shock: Secondary | ICD-10-CM | POA: Diagnosis present

## 2022-07-23 DIAGNOSIS — A419 Sepsis, unspecified organism: Secondary | ICD-10-CM | POA: Diagnosis not present

## 2022-07-23 LAB — COMPREHENSIVE METABOLIC PANEL
ALT: 58 U/L — ABNORMAL HIGH (ref 0–44)
AST: 44 U/L — ABNORMAL HIGH (ref 15–41)
Albumin: 2.6 g/dL — ABNORMAL LOW (ref 3.5–5.0)
Alkaline Phosphatase: 258 U/L — ABNORMAL HIGH (ref 38–126)
Anion gap: 6 (ref 5–15)
BUN: 27 mg/dL — ABNORMAL HIGH (ref 8–23)
CO2: 33 mmol/L — ABNORMAL HIGH (ref 22–32)
Calcium: 9 mg/dL (ref 8.9–10.3)
Chloride: 96 mmol/L — ABNORMAL LOW (ref 98–111)
Creatinine, Ser: 0.57 mg/dL (ref 0.44–1.00)
GFR, Estimated: >60 mL/min (ref >60)
Glucose, Bld: 113 mg/dL — ABNORMAL HIGH (ref 70–99)
Potassium: 4.6 mmol/L (ref 3.5–5.1)
Sodium: 135 mmol/L (ref 135–145)
Total Bilirubin: 0.5 mg/dL (ref 0.3–1.2)
Total Protein: 6.6 g/dL (ref 6.5–8.1)

## 2022-07-23 LAB — CBC WITH DIFFERENTIAL/PLATELET
Abs Immature Granulocytes: 0.03 10*3/uL (ref 0.00–0.07)
Basophils Absolute: 0 10*3/uL (ref 0.0–0.1)
Basophils Relative: 0 %
Eosinophils Absolute: 0.2 10*3/uL (ref 0.0–0.5)
Eosinophils Relative: 3 %
HCT: 35.1 % — ABNORMAL LOW (ref 36.0–46.0)
Hemoglobin: 10.6 g/dL — ABNORMAL LOW (ref 12.0–15.0)
Immature Granulocytes: 0 %
Lymphocytes Relative: 23 %
Lymphs Abs: 1.9 10*3/uL (ref 0.7–4.0)
MCH: 25.4 pg — ABNORMAL LOW (ref 26.0–34.0)
MCHC: 30.2 g/dL (ref 30.0–36.0)
MCV: 84.2 fL (ref 80.0–100.0)
Monocytes Absolute: 0.6 10*3/uL (ref 0.1–1.0)
Monocytes Relative: 8 %
Neutro Abs: 5.3 10*3/uL (ref 1.7–7.7)
Neutrophils Relative %: 66 %
Platelets: 244 10*3/uL (ref 150–400)
RBC: 4.17 MIL/uL (ref 3.87–5.11)
RDW: 15.6 % — ABNORMAL HIGH (ref 11.5–15.5)
WBC: 8.1 10*3/uL (ref 4.0–10.5)
nRBC: 0 % (ref 0.0–0.2)

## 2022-07-24 LAB — CULTURE, RESPIRATORY W GRAM STAIN
Culture: NORMAL
Gram Stain: NONE SEEN

## 2022-07-24 LAB — CULTURE, BLOOD (ROUTINE X 2): Culture: NO GROWTH

## 2022-07-29 ENCOUNTER — Encounter: Payer: Self-pay | Admitting: Adult Health

## 2022-07-29 ENCOUNTER — Non-Acute Institutional Stay (SKILLED_NURSING_FACILITY): Payer: Medicare HMO | Admitting: Adult Health

## 2022-07-29 ENCOUNTER — Other Ambulatory Visit: Payer: Self-pay | Admitting: Adult Health

## 2022-07-29 DIAGNOSIS — Z0000 Encounter for general adult medical examination without abnormal findings: Secondary | ICD-10-CM | POA: Diagnosis not present

## 2022-07-29 NOTE — Progress Notes (Signed)
Subjective:   [name] [name] is a 81 y.o. female who presents for Medicare Annual (Subsequent) preventive examination.  Review of Systems    Review of Systems  Constitutional:  Negative for malaise/fatigue.  Respiratory:  Negative for cough and shortness of breath.   Cardiovascular:  Negative for chest pain, palpitations and leg swelling.  Gastrointestinal:  Negative for abdominal pain and heartburn.       Fewer stools   Musculoskeletal:  Negative for back pain, joint pain and myalgias.  Skin: Negative.   Neurological:  Negative for dizziness.  Psychiatric/Behavioral:  The patient is not nervous/anxious.     Cardiac Risk Factors include: advanced age (>47mn, >>68women);sedentary lifestyle     Objective:    Today's Vitals   07/29/22 0947  BP: 137/82  Pulse: 71  Resp: 20  Temp: 98.3 F (36.8 C)  SpO2: 98%  Weight: 174 lb (78.9 kg)  Height: 5' 6"  (1.676 m)   Body mass index is 28.08 kg/m.     07/29/2022   10:23 AM 07/22/2022   10:46 AM 07/21/2022    9:46 AM 07/17/2022   10:19 AM 07/16/2022   11:17 AM 07/14/2022   11:56 AM 06/22/2022    4:01 PM  Advanced Directives  Does Patient Have a Medical Advance Directive? Yes Yes Yes Yes No No Yes  Type of Advance Directive Out of facility DNR (pink MOST or yellow form) Out of facility DNR (pink MOST or yellow form) Out of facility DNR (pink MOST or yellow form) Out of facility DNR (pink MOST or yellow form)   Out of facility DNR (pink MOST or yellow form)  Does patient want to make changes to medical advance directive? No - Patient declined No - Patient declined No - Patient declined No - Patient declined No - Patient declined No - Patient declined No - Patient declined  Pre-existing out of facility DNR order (yellow form or pink MOST form) Yellow form placed in chart (order not valid for inpatient use) Yellow form placed in chart (order not valid for inpatient use) Yellow form placed in chart (order not valid for inpatient use)         Current Medications (verified) Outpatient Encounter Medications as of 07/29/2022  Medication Sig   acetaminophen (TYLENOL) 160 MG/5ML liquid Take 20 mLs by mouth every 8 (eight) hours as needed for fever.   albuterol (VENTOLIN HFA) 108 (90 Base) MCG/ACT inhaler Inhale 2 puffs into the lungs every 6 (six) hours as needed for wheezing or shortness of breath.   Amino Acids-Protein Hydrolys (PRO-STAT PO) Take 30 mLs by mouth 3 (three) times daily. In tube   aspirin EC 81 MG tablet Take 81 mg by mouth daily. Swallow whole.   Balsam [place]-Castor Oil (VENELEX) OINT Apply 1 application. topically daily.   busPIRone (BUSPAR) 7.5 MG tablet Take 7.5 mg by mouth 3 (three) times daily.   Cholecalciferol 25 MCG (1000 UT) tablet Place 1,000 Units into feeding tube daily. 9 am   clobetasol (TEMOVATE) 0.05 % external solution Apply 1 application. topically 2 (two) times a week. Monday and Friday   diclofenac Sodium (VOLTAREN) 1 % GEL Apply 2 g topically 3 (three) times daily. to neck and shoulders for pain management   diltiazem (CARDIZEM) 120 MG tablet Take 120 mg by mouth every 8 (eight) hours.   ELIQUIS 5 MG TABS tablet Take 1 tablet (5 mg total) by mouth 2 (two) times daily.   eszopiclone 3 MG TABS Take 1 tablet (  3 mg total) by mouth at bedtime. Take immediately before bedtime   fentaNYL (DURAGESIC) 50 MCG/HR Place 1 patch onto the skin every 3 (three) days.   fluconazole (DIFLUCAN) 200 MG tablet Take 200 mg by mouth daily.   FLUoxetine (PROZAC) 20 MG/5ML solution Place 60 mg into feeding tube daily.   furosemide (LASIX) 40 MG tablet Place 40 mg into feeding tube 2 (two) times daily. 9 am   ipratropium-albuterol (DUONEB) 0.5-2.5 (3) MG/3ML SOLN Take 3 mLs by nebulization every 6 (six) hours as needed.   LINZESS 145 MCG CAPS capsule Place 145 mcg into feeding tube daily as needed (constipation).   meclizine (ANTIVERT) 25 MG tablet Place 25 mg into feeding tube 3 (three) times daily as needed for dizziness.    morphine (ROXANOL) 20 MG/ML concentrated solution Take 5 mg by mouth every 2 (two) hours as needed for severe pain.   naloxone (NARCAN) nasal spray 4 mg/0.1 mL Special Instructions: As needed for depressed respirations less than 8/minute. Notify provider if given. If no effectiveness or improvement, contact 911. As Needed   nitroGLYCERIN (NITROSTAT) 0.4 MG SL tablet Place 1 tablet (0.4 mg total) under the tongue every 5 (five) minutes as needed for chest pain (up to 3 doses).   Omeprazole-Sodium Bicarbonate (KONVOMEP) 2-84 MG/ML SUSR Give 10 mLs by tube in the morning and at bedtime.   potassium chloride 20 MEQ/15ML (10%) SOLN Take 20 mEq by mouth daily. For potassium supplement   rOPINIRole (REQUIP) 0.5 MG tablet Place 0.5 mg into feeding tube in the morning and at bedtime.   rosuvastatin (CRESTOR) 10 MG tablet Place 10 mg into feeding tube daily. 9 am   saccharomyces boulardii (FLORASTOR) 250 MG capsule Take 250 mg by mouth 2 (two) times daily.   vancomycin (VANCOCIN) 50 mg/mL SOLN oral solution Take 2.5 mLs (125 mg total) by mouth every 6 (six) hours. Take 9m   Vancomycin HCl 250 MG SOLR Inject 5 mLs into the vein every 6 (six) hours.   No facility-administered encounter medications on file as of 07/29/2022.    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 Abnl nuc 11/2014. Cath 12/2014 - turned down for CABG. Ultimately s/p TTVP, rotational atherectomy, PTCA and  stenting of the ostial LCx and left main into the LAD (crush technique), and IVUS of the LAD/Left main. // b. Myoview 11/17: EF 48, poor quality/significant artifact; inf-lateral, inferior ischemia; Intermediate Risk   [place]    a.  First noted post-op 9/15 spinal fusion.  She had cardioversion, not on anticoagulation. Fall risk, unsteady. // failed DCCV // Holter 10/17: AFib, Avg HR 97, PVCs, no other arrhythmia   Chronic back pain greater than 3 months duration    a. spinal stenosis.  [facility]   Chronic diastolic CHF    Echo 21/44  EF 50-55%, trivial AI, midl MR, mod LAE, PASP 37 mmHg // Echocardiogram 03/2020: EF 45-50, no RWMA, mild LVH, mild reduced RVSF, mildly elevated PASP (RVSP  38.3), mild LAE, mild MR, mild to mod TR, trivial AI // Echo 9/21: EF 45, mild LVH, mildly reduced RV SF, moderate LAE, mild RAE, mild MR, mild AI      Circadian rhythm sleep disorder    CTS (carpal tunnel syndrome)    Deficiency anemia 11/10/2014   Depression    Diarrhea    Diverticulitis of colon    Esophageal stricture    Gastritis    Gastroesophageal hernia 07/06/2013   GERD (gastroesophageal reflux disease)    Hiatal hernia    History of blood transfusion    "most of them related to OR's"    HTN (hypertension)    Hyperlipidemia    IBS (irritable bowel syndrome)    Incontinence 10/13/2012   Insomnia    Ischemic chest pain ([place])    Memory disorder 16/09/9603   Metabolic syndrome 54/08/8118   MGUS (monoclonal gammopathy of unknown significance) dx'd 11/2014   a. Neg BMB 11/2014.   Multiple falls    Obesity    Obstructive sleep apnea    "have mask; don't wear it" (01/07/2015)   Orthostasis    PAT (paroxysmal atrial tachycardia) ([place])    Personal history of colonic polyps 10/25/2011 & 12/02/11   not retrieved Dr Lyla Son & tubular adenomas   Pneumonia 03/2014   Sinus bradycardia    a. Baseline HR  50s-60s.   Stroke [facility]) early 2000's   "small"; denies residual on 01/07/2015)   Past Surgical History:  Procedure Laterality Date   BACK SURGERY     BONE MARROW BIOPSY  11/2014   CARDIAC CATHETERIZATION  12/27/2014   Procedure: INTRAVASCULAR PRESSURE WIRE/FFR STUDY;  Surgeon: Larey Dresser, MD;  Location: [facility] CATH LAB;  Service: Cardiovascular;;   CARDIAC CATHETERIZATION N/A 11/25/2016   Procedure: Left Heart Cath and Coronary Angiography;  Surgeon: Sherren Mocha, MD;  Location: [place] CV LAB;  Service: Cardiovascular;  Laterality: N/A;   CARDIOVERSION N/A 02/13/2016   Procedure: CARDIOVERSION;  Surgeon: Josue Hector, MD;  Location: [place];  Service: Cardiovascular;  Laterality: N/A;   CARPAL TUNNEL RELEASE Right 1980's   CATARACT EXTRACTION Bilateral    CATARACT EXTRACTION W/ INTRAOCULAR LENS  IMPLANT, BILATERAL  2000's   CORONARY ANGIOPLASTY WITH STENT PLACEMENT  01/07/2015   "2"   CORONARY STENT PLACEMENT     DILATION AND CURETTAGE OF UTERUS     ESOPHAGOGASTRODUODENOSCOPY (EGD) WITH ESOPHAGEAL DILATION  "several times"   JOINT REPLACEMENT     KNEE ARTHROSCOPY Left 1995   LAPAROSCOPIC CHOLECYSTECTOMY  2003   LEFT HEART CATHETERIZATION WITH CORONARY ANGIOGRAM N/A 12/27/2014   Procedure: LEFT HEART CATHETERIZATION WITH CORONARY ANGIOGRAM;  Surgeon: Larey Dresser, MD;  Location: [facility] CATH LAB;  Service: Cardiovascular;  Laterality: N/A;   PERCUTANEOUS CORONARY ROTOBLATOR INTERVENTION (PCI-R)  01/07/2015   PERCUTANEOUS CORONARY ROTOBLATOR INTERVENTION (PCI-R) N/A 01/07/2015   Procedure: PERCUTANEOUS CORONARY ROTOBLATOR INTERVENTION (PCI-R);  Surgeon: Blane Ohara, MD;  Location: [facility] CATH LAB;  Service: Cardiovascular;  Laterality: N/A;   POSTERIOR FUSION THORACIC SPINE  08/2015   POSTERIOR LUMBAR FUSION  01/2015   TOTAL KNEE ARTHROPLASTY Bilateral 1990's - 2000's   Family History  Problem Relation Age of Onset   Coronary artery disease Father    Peripheral vascular disease  Father    Coronary artery disease Mother    Coronary artery disease Brother    Colon cancer Sister    Emphysema Sister    Sleep apnea Son    Colon cancer Sister  spread to her brain   Emphysema Brother    Dementia Neg Hx    Social History   Socioeconomic History   Marital status: Widowed    Spouse name: Not on file   Number of children: 2   Years of education: HS   Highest education level: Not on file  Occupational History   Occupation: Licensed conveyancer- retired    Comment: retired   Occupation: retired  Tobacco Use   Smoking status: Never   Smokeless tobacco: Never  Vaping Use   Vaping Use: Never used  Substance and Sexual Activity   Alcohol use: No    Comment: 01/07/2015 "glass of wine at Christmas, maybe"   Drug use: No   Sexual activity: Not Currently  Other Topics Concern   Not on file  Social History Narrative   Patient is right handed   [facility]    Social Determinants of Health   Financial Resource Strain: Not on file  Food Insecurity: Not on file  Transportation Needs: Not on file  Physical Activity: Inactive (04/04/2020)   Exercise Vital Sign    Days of Exercise per Week: 0 days    Minutes of Exercise per Session: 0 min  Stress: Stress Concern Present (04/04/2020)   [place]    Feeling of Stress : To some extent  Social Connections: Socially Isolated (04/04/2020)   Social Connection and Isolation Panel [NHANES]    Frequency of Communication with Friends and Family: Three times a week    Frequency of Social Gatherings with Friends and Family: Once a week    Attends Religious Services: Never    Marine scientist or Organizations: No    Attends Archivist Meetings: Never    Marital Status: Widowed    Tobacco Counseling Counseling given: Not Answered   Clinical Intake:  Pre-visit preparation completed: Yes  Pain : No/denies pain     BMI -  recorded: 28.08 Nutritional Status: BMI 25 -29 Overweight Nutritional Risks: Unintentional weight loss, Failure to thrive Diabetes: No  How often do you need to have someone help you when you read instructions, pamphlets, or other written materials from your doctor or pharmacy?: 5 - Always  Diabetic?no  Interpreter Needed?: No      Activities of Daily Living    07/29/2022   11:46 AM 10/25/2021    9:17 AM  In your present state of health, do you have any difficulty performing the following activities:  Hearing? 0 1  Vision? 0 0  Difficulty concentrating or making decisions? 1 1  Walking or climbing stairs? 1 1  Dressing or bathing? 1 0  Doing errands, shopping? 1 0  Preparing Food and eating ? Y   Using the Toilet? Y   In the past six months, have you accidently leaked urine? Y   Do you have problems with loss of bowel control? Y   Managing your Medications? Y   Housekeeping or managing your Housekeeping? Y     Patient Care Team: Gerlene Fee, NP as PCP - General (Geriatric Medicine) Sherren Mocha, MD as PCP - Cardiology (Cardiology) Vickie Epley, MD as PCP - Electrophysiology (Cardiology) Suella Broad, MD (Physical Medicine and Rehabilitation) Hillary Bow, MD (Cardiology) Newton Pigg, MD (Obstetrics and Gynecology) Sharmon Revere as Physician Assistant (Cardiology) [facility], [place] ([place])  Indicate any recent Medical Services you may have received from other than [facility] providers  in the past year (date may be approximate).     Assessment:   This is a routine wellness examination for [place].  Hearing/Vision screen No results found.  Dietary issues and exercise activities discussed: Current Exercise Habits: The patient does not participate in regular exercise at present   Goals Addressed             This Visit's Progress    COMPLETED: DIET - EAT [place] - Client will not  be readmitted within 30 days (C-SNP)       Prevent falls   On track      Depression Screen    07/29/2022   11:45 AM 11/26/2021    1:42 PM 10/24/2021    9:12 AM 10/21/2021    3:32 PM 06/02/2021    1:09 PM 01/31/2021    1:10 PM 04/04/2020    2:30 PM  PHQ 2/9 Scores  PHQ - 2 Score 0 2 2 2 6  0 3  PHQ- 9 Score  6 5 5 17  0 10    Fall Risk    07/29/2022   11:45 AM 11/26/2021    1:42 PM 10/24/2021    9:12 AM 10/21/2021    3:33 PM 06/02/2021    1:09 PM  Fall Risk   Falls in the past year? 0 1 1 1  0  Number falls in past yr: 0 1 1 1    Injury with Fall? 0 0 0 0   Risk for fall due to :  History of fall(s)     Follow up  Education provided       [place]:  Any stairs in or around the home? Yes  If so, are there any without handrails? No  Home free of loose throw rugs in walkways, pet beds, electrical cords, etc? Yes  Adequate lighting in your home to reduce risk of falls? Yes   ASSISTIVE DEVICES UTILIZED TO PREVENT FALLS:  Life alert? No  Use of a cane, walker or w/c? Yes  Grab bars in the bathroom? Yes  Shower chair or bench in shower? Yes  Elevated toilet seat or a handicapped toilet? Yes   TIMED UP AND GO:  Was the test performed? Yes .  Length of time to ambulate 10 feet: 10 sec.   Gait slow and steady with assistive device  Cognitive Function:    07/29/2022   11:46 AM 06/20/2015   12:00 PM 12/04/2014    3:18 PM  MMSE - Mini Mental State Exam  Orientation to time 5 5 3   Orientation to Place 5 5 5   Registration 3 3 3   Attention/ Calculation 2 5 5   Recall 2 1 2   Language- name 2 objects 2 2 2   Language- repeat 1 1 1   Language- follow 3 step command 3 3 2   Language- read & follow direction 1 1 1   Write a sentence 0 1 1  Copy design 0 1 0  Total score 24 28 25         07/29/2022   11:47 AM 04/04/2020    3:05 PM 04/04/2020    2:46 PM  6CIT Screen  What Year? 0 points 0 points 0 points  What month? 0 points 0 points 0 points  What  time? 0 points 0 points   Count back from 20 2 points 0 points   Months in reverse 2 points 2 points   Repeat phrase  4 points 2 points   Total Score 8 points 4 points     Immunizations Immunization History  Administered Date(s) Administered   Fluad Quad(high Dose 65+) 10/04/2019, 10/01/2020, 10/13/2021   Influenza, High Dose Seasonal PF 10/27/2016, 10/06/2017, 10/03/2018   Influenza,inj,Quad PF,6+ Mos 10/16/2013, 10/11/2015   Influenza,inj,quad, With Preservative 09/20/2018   Influenza-Unspecified 09/26/2014   PFIZER(Purple Top)SARS-COV-2 Vaccination 02/01/2020, 02/26/2020, 12/02/2020, 12/05/2020   PPD Test 02/12/2014   Pneumococcal Conjugate-13 12/20/2013   Pneumococcal Polysaccharide-23 10/01/2020   Tdap 09/02/2011   Zoster, Live 04/13/2012    TDAP status: Up to date  Flu Vaccine status: Up to date  Pneumococcal vaccine status: Up to date  Covid-19 vaccine status: Completed vaccines  Qualifies for Shingles Vaccine? Yes   Zostavax completed Yes   Shingrix Completed?: Yes  Screening Tests Health Maintenance  Topic Date Due   Zoster Vaccines- Shingrix (1 of 2) Never done   COVID-19 Vaccine (5 - Booster for Pfizer series) 01/30/2021   INFLUENZA VACCINE  07/21/2022   PAP SMEAR-Modifier  05/10/2024 (Originally 03/06/2017)   MAMMOGRAM  02/11/2023   TETANUS/TDAP  04/14/2023   COLONOSCOPY (Pts 45-102yr Insurance coverage will need to be confirmed)  03/19/2024   Pneumonia Vaccine 81 Years old  Completed   DEXA SCAN  Completed   HPV VACCINES  Aged Out    Health Maintenance  Health Maintenance Due  Topic Date Due   Zoster Vaccines- Shingrix (1 of 2) Never done   COVID-19 Vaccine (5 - Booster for Pfizer series) 01/30/2021   INFLUENZA VACCINE  07/21/2022    Colorectal cancer screening: No longer required.   Mammogram status: No longer required due to age.  Bone Density status: Ordered  . Pt provided with contact info and advised to call to schedule appt.  Lung  Cancer Screening: (Low Dose CT Chest recommended if Age 547-80years, 30 pack-year currently smoking OR have quit w/in 15years.) does not qualify.   Lung Cancer Screening Referral: n/a  Additional Screening:  Hepatitis C Screening: does not qualify; Completed   Vision Screening: Recommended annual ophthalmology exams for early detection of glaucoma and other disorders of the eye. Is the patient up to date with their annual eye exam?  Yes  Who is the provider or what is the name of the office in which the patient attends annual eye exams?  If pt is not established with a provider, would they like to be referred to a provider to establish care? Yes .   Dental Screening: Recommended annual dental exams for proper oral hygiene  Community Resource Referral / Chronic Care Management: CRR required this visit?  No   CCM required this visit?  No      Plan:     I have personally reviewed and noted the following in the patient's chart:   Medical and social history Use of alcohol, tobacco or illicit drugs  Current medications and supplements including opioid prescriptions.  Functional ability and status Nutritional status Physical activity Advanced directives List of other physicians Hospitalizations, surgeries, and ER visits in previous 12 months Vitals Screenings to include cognitive, depression, and falls Referrals and appointments  In addition, I have reviewed and discussed with patient certain preventive protocols, quality metrics, and best practice recommendations. A written personalized care plan for preventive services as well as general preventive health recommendations were provided to patient.     DGerlene Fee NP   07/29/2022   Nurse Notes: this exam was performed by me at this facility

## 2022-07-30 ENCOUNTER — Encounter: Payer: Self-pay | Admitting: Adult Health

## 2022-07-31 ENCOUNTER — Non-Acute Institutional Stay (SKILLED_NURSING_FACILITY): Payer: Medicare HMO | Admitting: Adult Health

## 2022-07-31 DIAGNOSIS — I4821 Permanent atrial fibrillation: Secondary | ICD-10-CM

## 2022-07-31 DIAGNOSIS — I700 Atherosclerosis of aorta: Secondary | ICD-10-CM

## 2022-07-31 DIAGNOSIS — I5032 Chronic diastolic (congestive) heart failure: Secondary | ICD-10-CM

## 2022-07-31 NOTE — Progress Notes (Signed)
Location:  [place] Room Number: 159 Place of Service:  SNF (31)   CODE STATUS: DNR   Allergies  Allergen Reactions   Buprenorphine Hcl Itching   Cymbalta [Duloxetine Hcl] Other (See Comments)    Other reaction(s): Other (See Comments) Personality changes - crying  2014   Morphine And Related Itching   Oxycodone-Acetaminophen Other (See Comments)    Personality changes    Valium [Diazepam] Other (See Comments)    Personality changes - "in another world" ;  Hallucinations  2015   Statins Other (See Comments)    Other reaction(s): Other (See Comments) Muscle weakness Muscle weakness   Morphine Itching   Altace [Ramipril] Other (See Comments)    unknown   Floxin [Ofloxacin] Other (See Comments)    unknown   Hydromorphone Other (See Comments)    Other reaction(s): Confusion (intolerance) unknown   Lovaza [Omega-3-Acid Ethyl Esters] Other (See Comments)    Muscle weakness    Trilipix [Choline Fenofibrate] Other (See Comments)    Muscle weakness    Zetia [Ezetimibe] Other (See Comments)    Muscle weakness     Chief Complaint  Patient presents with   Acute Visit    Care plan meeting     HPI:  We have come together for her care plan meeting. No family present. BIMS 15/15 mood 6/30: decreased sleeping; decreased energy. She can ambulate with walker and assist; with no falls. She requires limited to extensive assist with her adl care. She is incontinent of bladder and frequently incontinent of bowel. She is on tube feeding 20 hours per day. There are fewer reports of her coughing present. Her weight is 174 pounds is up 14 pounds in 3 months. Therapy: none at this time. She will need to be seen by therapy; as she is spending nearly all of her time in bed. She is being treated for c-diff infection. Activities: one on one in room. 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 Abnl nuc 11/2014. Cath 12/2014 - turned down for CABG. Ultimately s/p TTVP, rotational atherectomy, PTCA and stenting of the ostial LCx and left main into the LAD (crush technique), and IVUS of the LAD/Left main. // b. Myoview 11/17: EF 48, poor quality/significant artifact; inf-lateral, inferior ischemia; Intermediate Risk   Chronic atrial fibrillation ([place])    a.  First noted post-op 9/15 spinal fusion.  She had cardioversion, not on anticoagulation. Fall risk, unsteady. // failed DCCV // Holter 10/17: AFib, Avg HR 97, PVCs, no other arrhythmia   Chronic back pain greater than 3 months duration    a. spinal stenosis.  Spinal fusion with rods in 2/15 at [facility] spinal fusion 9/15.   Chronic diastolic CHF    Echo 5/63:  EF 50-55%, trivial AI, midl MR, mod LAE, PASP 37 mmHg // Echocardiogram 03/2020: EF 45-50, no RWMA, mild LVH, mild reduced RVSF, mildly elevated PASP (RVSP 38.3), mild LAE, mild MR, mild to mod TR, trivial AI // Echo 9/21: EF 45, mild LVH, mildly reduced RV SF, moderate LAE, mild RAE, mild MR, mild AI  Circadian rhythm sleep disorder    CTS (carpal tunnel syndrome)    Deficiency anemia 11/10/2014   Depression    Diarrhea    Diverticulitis of colon    Esophageal stricture    Gastritis    Gastroesophageal hernia 07/06/2013   GERD (gastroesophageal reflux disease)    Hiatal hernia    History of blood transfusion    "most of them related to OR's"    HTN (hypertension)    Hyperlipidemia    IBS (irritable bowel syndrome)    Incontinence 10/13/2012   Insomnia    Ischemic chest pain ([place])    Memory disorder 47/65/4650   Metabolic syndrome 35/46/5681    MGUS (monoclonal gammopathy of unknown significance) dx'd 11/2014   a. Neg BMB 11/2014.   Multiple falls    Obesity    Obstructive sleep apnea    "have mask; don't wear it" (01/07/2015)   Orthostasis    PAT (paroxysmal atrial tachycardia) ([place])    Personal history of colonic polyps 10/25/2011 & 12/02/11   not retrieved Dr Lyla Son & tubular adenomas   Pneumonia 03/2014   Sinus bradycardia    a. Baseline HR 50s-60s.   Stroke [facility]) early 2000's   "small"; denies residual on 01/07/2015)    Past Surgical History:  Procedure Laterality Date   BACK SURGERY     BONE MARROW BIOPSY  11/2014   CARDIAC CATHETERIZATION  12/27/2014   Procedure: INTRAVASCULAR PRESSURE WIRE/FFR STUDY;  Surgeon: Larey Dresser, MD;  Location: [facility] CATH LAB;  Service: Cardiovascular;;   CARDIAC CATHETERIZATION N/A 11/25/2016   Procedure: Left Heart Cath and Coronary Angiography;  Surgeon: Sherren Mocha, MD;  Location: [place] CV LAB;  Service: Cardiovascular;  Laterality: N/A;   CARDIOVERSION N/A 02/13/2016   Procedure: CARDIOVERSION;  Surgeon: Josue Hector, MD;  Location: [place];  Service: Cardiovascular;  Laterality: N/A;   CARPAL TUNNEL RELEASE Right 1980's   CATARACT EXTRACTION Bilateral    CATARACT EXTRACTION W/ INTRAOCULAR LENS  IMPLANT, BILATERAL  2000's   CORONARY ANGIOPLASTY WITH STENT PLACEMENT  01/07/2015   "2"   CORONARY STENT PLACEMENT     DILATION AND CURETTAGE OF UTERUS     ESOPHAGOGASTRODUODENOSCOPY (EGD) WITH ESOPHAGEAL DILATION  "several times"   JOINT REPLACEMENT     KNEE ARTHROSCOPY Left 1995   LAPAROSCOPIC CHOLECYSTECTOMY  2003   LEFT HEART CATHETERIZATION WITH CORONARY ANGIOGRAM N/A 12/27/2014   Procedure: LEFT HEART CATHETERIZATION WITH CORONARY ANGIOGRAM;  Surgeon: Larey Dresser, MD;  Location: [facility] C[facility] LAB;  Service: Cardiovascular;  Laterality: N/A;   PERCUTANEOUS CORONARY ROTOBLATOR INTERVENTION (PCI-R)  01/07/2015   PERCUTANEOUS CORONARY ROTOBLATOR INTERVENTION (PCI-R) N/A  01/07/2015   Procedure: PERCUTANEOUS CORONARY ROTOBLATOR INTERVENTION (PCI-R);  Surgeon: Blane Ohara, MD;  Location: [facility] CATH LAB;  Service: Cardiovascular;  Laterality: N/A;   POSTERIOR FUSION THORACIC SPINE  08/2015   POSTERIOR LUMBAR FUSION  01/2015   TOTAL KNEE ARTHROPLASTY Bilateral 1990's - 2000's    Social History   Socioeconomic History   Marital status: Widowed    Spouse name: Not on file   Number of children: 2   Years of education: HS   Highest education level: Not on file  Occupational History   Occupation: Licensed conveyancer- retired    Comment: retired   Occupation: retired  Tobacco Use   Smoking status: Never   Smokeless tobacco: Never  Vaping Use   Vaping Use: Never used  Substance and Sexual Activity   Alcohol use: No  Comment: 01/07/2015 "glass of wine at Christmas, maybe"   Drug use: No   Sexual activity: Not Currently  Other Topics Concern   Not on file  Social History Narrative   Patient is right handed   Long term resident of [facility]    Social Determinants of Health   Financial Resource Strain: Not on file  Food Insecurity: Not on file  Transportation Needs: Not on file  Physical Activity: Inactive (04/04/2020)   Exercise Vital Sign    Days of Exercise per Week: 0 days    Minutes of Exercise per Session: 0 min  Stress: Stress Concern Present (04/04/2020)   [place]    Feeling of Stress : To some extent  Social Connections: Socially Isolated (04/04/2020)   Social Connection and Isolation Panel [NHANES]    Frequency of Communication with Friends and Family: Three times a week    Frequency of Social Gatherings with Friends and Family: Once a week    Attends Religious Services: Never    Marine scientist or Organizations: No    Attends Archivist Meetings: Never    Marital Status: Widowed  Human resources officer Violence: Not on file   Family History  Problem Relation Age of  Onset   Coronary artery disease Father    Peripheral vascular disease Father    Coronary artery disease Mother    Coronary artery disease Brother    Colon cancer Sister    Emphysema Sister    Sleep apnea Son    Colon cancer Sister        spread to her brain   Emphysema Brother    Dementia Neg Hx       VITAL SIGNS BP 137/82   Pulse 71   Temp 98.3 F (36.8 C)   Resp 20   Ht 5' 6"  (1.676 m)   Wt 174 lb (78.9 kg)   SpO2 98%   BMI 28.08 kg/m   Outpatient Encounter Medications as of 07/31/2022  Medication Sig   acetaminophen (TYLENOL) 160 MG/5ML liquid Take 20 mLs by mouth every 8 (eight) hours as needed for fever.   albuterol (VENTOLIN HFA) 108 (90 Base) MCG/ACT inhaler Inhale 2 puffs into the lungs every 6 (six) hours as needed for wheezing or shortness of breath.   Amino Acids-Protein Hydrolys (PRO-STAT PO) Take 30 mLs by mouth 3 (three) times daily. In tube   aspirin EC 81 MG tablet Take 81 mg by mouth daily. Swallow whole.   Balsam [place]-Castor Oil (VENELEX) OINT Apply 1 application. topically daily.   busPIRone (BUSPAR) 7.5 MG tablet Take 7.5 mg by mouth 3 (three) times daily.   Cholecalciferol 25 MCG (1000 UT) tablet Place 1,000 Units into feeding tube daily. 9 am   clobetasol (TEMOVATE) 0.05 % external solution Apply 1 application. topically 2 (two) times a week. Monday and Friday   diclofenac Sodium (VOLTAREN) 1 % GEL Apply 2 g topically 3 (three) times daily. to neck and shoulders for pain management   diltiazem (CARDIZEM) 120 MG tablet Take 120 mg by mouth every 8 (eight) hours.   ELIQUIS 5 MG TABS tablet Take 1 tablet (5 mg total) by mouth 2 (two) times daily.   eszopiclone 3 MG TABS Take 1 tablet (3 mg total) by mouth at bedtime. Take immediately before bedtime   fentaNYL (DURAGESIC) 50 MCG/HR Place 1 patch onto the skin every 3 (three) days.   fidaxomicin (DIFICID) 200 MG TABS tablet Take  200 mg by mouth 2 (two) times daily.   FLUoxetine (PROZAC) 20 MG/5ML solution  Place 60 mg into feeding tube daily.   furosemide (LASIX) 40 MG tablet Place 40 mg into feeding tube 2 (two) times daily. 9 am   ipratropium-albuterol (DUONEB) 0.5-2.5 (3) MG/3ML SOLN Take 3 mLs by nebulization every 6 (six) hours as needed.   LINZESS 145 MCG CAPS capsule Place 145 mcg into feeding tube daily as needed (constipation).   meclizine (ANTIVERT) 25 MG tablet Place 25 mg into feeding tube 3 (three) times daily as needed for dizziness.   morphine (ROXANOL) 20 MG/ML concentrated solution Take 5 mg by mouth every 2 (two) hours as needed for severe pain.   naloxone (NARCAN) nasal spray 4 mg/0.1 mL Special Instructions: As needed for depressed respirations less than 8/minute. Notify provider if given. If no effectiveness or improvement, contact 911. As Needed   nitroGLYCERIN (NITROSTAT) 0.4 MG SL tablet Place 1 tablet (0.4 mg total) under the tongue every 5 (five) minutes as needed for chest pain (up to 3 doses).   Omeprazole-Sodium Bicarbonate (KONVOMEP) 2-84 MG/ML SUSR Give 10 mLs by tube in the morning and at bedtime.   potassium chloride 20 MEQ/15ML (10%) SOLN Take 20 mEq by mouth daily. For potassium supplement   rOPINIRole (REQUIP) 0.5 MG tablet Place 0.5 mg into feeding tube in the morning and at bedtime.   rosuvastatin (CRESTOR) 10 MG tablet Place 10 mg into feeding tube daily. 9 am   saccharomyces boulardii (FLORASTOR) 250 MG capsule Take 250 mg by mouth 2 (two) times daily.   [DISCONTINUED] fluconazole (DIFLUCAN) 200 MG tablet Take 200 mg by mouth daily.   [DISCONTINUED] vancomycin (VANCOCIN) 50 mg/mL SOLN oral solution Take 2.5 mLs (125 mg total) by mouth every 6 (six) hours. Take 23m   [DISCONTINUED] Vancomycin HCl 250 MG SOLR Inject 5 mLs into the vein every 6 (six) hours.   No facility-administered encounter medications on file as of 07/31/2022.     SIGNIFICANT DIAGNOSTIC EXAMS  PREVIOUS   02-23-22: TEE:  The left ventricular size is normal.  Mild left ventricular  hypertrophy  LV ejection fraction = 45-50%.  Left ventricular systolic function is mildly reduced.  The right ventricle is normal in size and function.  The left atrium is moderately to severely dilated.  The right atrium is mildly to moderately dilated.  Estimated right ventricular systolic pressure is 27 mmHg.  IVC size was normal.  There is no significant valvular stenosis or regurgitation.  There is no pericardial effusion.  There is a pleural effusion present.  Recommend a limited TTE when HR is controlled to reassess   05-23-22: chest x-ray: mild congestive heart failure  07-14-22: chest x-ray:  Changes of bibasilar atelectasis  Nonspecific perihilar inflammatory changes Bilateral spinal fusion hardware   07-20-22: chest x-ray:  Nonspecific perihilar changes Emphysema No improvement compared to 07-14-22.   07-21-22: chest x-ray:  No acute pulmonary process.   NO NEW EXAMS.   LABS REVIEWED: PREVIOUS   02-27-22: glucose 102; bun 13; creat 0.52; k+ 4.8; na++ 134; ca 8.9; GFR>90 04-08-22: wbc 7.2 hgb 7.2; hct 11.9; mcv 35.2 plt 185; glucose 114; bun 11; creat 0.37; k+ 4.5; na++ 132; ca 8.6; GFR >90 05-21-22: glucose 113; bun 19; creat 0.5; k+ 4.2; na++ 135; ca 8.5 gfr>60; protein 5.2; albumin 2.4  05-23-22: wbc 5.4; hgb 9.2; hct 29.6 plt 184; glucose 123; bun 21; creat 0.54; k+ 3.8; na++ 8.2; ca 8.2 gfr >60 07-01-22: glucose  91; bun 16; creat 0.44; k+ 4.1; na++ 135; ca 8.6; gfr>60 07-14-22: wbc 9.9; hgb 10.8; hct 35.3; mcv 83.8 plt 283; glucose 168; bun 24; creat 0.52; k+ 4.2; na++ 133; ca 8.6 gfr >60 urine culture:  blood culture: klebsiella pneumoniae  07-15-22: wbc 7.1; hgb 9.4; hct 31.2; mcv 85.2 plt 246; glucose 134; bun 28; creat 0.52; k+ 4.6; na++ 135; ca 8.2; gfr >60; protein 6.2 albumin 2.5; ast 50 alt 85 alk phos 283  07-19-22: blood culture: no growth  07-20-22: wbc 6.2; hgb 9.9; hct 33.2; mcv 84.9 plt 270; glucose 121; bun 24; creat 0.56; k+ 3.6; na++ 135; ca 8.5 gfr >60; protein  6.3 albumin 2.4; ast 31; alt 65; alk phos 286 07-21-22: c-dff +  NO NEW LABS.    Review of Systems  Constitutional:  Negative for malaise/fatigue.  Respiratory:  Negative for cough and shortness of breath.   Cardiovascular:  Negative for chest pain, palpitations and leg swelling.  Gastrointestinal:  Negative for abdominal pain, constipation and heartburn.  Musculoskeletal:  Negative for back pain, joint pain and myalgias.  Skin: Negative.   Neurological:  Negative for dizziness.  Psychiatric/Behavioral:  The patient is not nervous/anxious.    Physical Exam Constitutional:      General: She is not in acute distress.    Appearance: She is well-developed. She is not diaphoretic.  Neck:     Thyroid: No thyromegaly.  Cardiovascular:     Rate and Rhythm: Normal rate and regular rhythm.     Pulses: Normal pulses.     Heart sounds: Normal heart sounds.  Pulmonary:     Effort: Pulmonary effort is normal. No respiratory distress.     Breath sounds: Normal breath sounds.  Abdominal:     General: Bowel sounds are normal. There is no distension.     Palpations: Abdomen is soft.     Tenderness: There is no abdominal tenderness.     Comments: Peg tube present   Musculoskeletal:        General: Normal range of motion.     Cervical back: Neck supple.     Right lower leg: No edema.     Left lower leg: No edema.  Lymphadenopathy:     Cervical: No cervical adenopathy.  Skin:    General: Skin is warm and dry.  Neurological:     Mental Status: She is alert and oriented to person, place, and time.  Psychiatric:        Mood and Affect: Mood normal.     ASSESSMENT/ PLAN:  TODAY  Aortic atherosclerosis Chronic diastolic CHF (congestive heart failure) Permanent atrial fibrillation   Will continue current medications Will continue current plan of care Will continue to monitor her status.    Time spent with patient: 40 minutes: medications; plan of care and therapy needs.     Ok Edwards NP [facility]   call [phone]

## 2022-08-03 ENCOUNTER — Other Ambulatory Visit: Payer: Self-pay | Admitting: Adult Health

## 2022-08-07 ENCOUNTER — Other Ambulatory Visit: Payer: Self-pay | Admitting: Adult Health

## 2022-08-07 MED ORDER — ESZOPICLONE 3 MG PO TABS: 3.0000 mg | ORAL_TABLET | Freq: Every day | ORAL | 0 refills | Status: DC

## 2022-08-07 MED ORDER — FENTANYL 50 MCG/HR TD PT72: 1.0000 | MEDICATED_PATCH | TRANSDERMAL | 0 refills | Status: DC

## 2022-08-10 ENCOUNTER — Non-Acute Institutional Stay (SKILLED_NURSING_FACILITY): Payer: Medicare HMO | Admitting: Adult Health

## 2022-08-10 ENCOUNTER — Encounter: Payer: Self-pay | Admitting: Adult Health

## 2022-08-10 DIAGNOSIS — I700 Atherosclerosis of aorta: Secondary | ICD-10-CM | POA: Diagnosis not present

## 2022-08-10 DIAGNOSIS — G894 Chronic pain syndrome: Secondary | ICD-10-CM | POA: Diagnosis not present

## 2022-08-10 DIAGNOSIS — E785 Hyperlipidemia, unspecified: Secondary | ICD-10-CM | POA: Diagnosis not present

## 2022-08-10 DIAGNOSIS — D500 Iron deficiency anemia secondary to blood loss (chronic): Secondary | ICD-10-CM

## 2022-08-10 NOTE — Progress Notes (Signed)
Location:  [facility] Room Number: [id number] Place of Service:  SNF (31)   CODE STATUS: dnr   Allergies  Allergen Reactions   Buprenorphine Hcl Itching   Cymbalta [Duloxetine Hcl] Other (See Comments)    Other reaction(s): Other (See Comments) Personality changes - crying  2014   Morphine And Related Itching   Oxycodone-Acetaminophen Other (See Comments)    Personality changes    Valium [Diazepam] Other (See Comments)    Personality changes - "in another world" ;  Hallucinations  2015   Statins Other (See Comments)    Other reaction(s): Other (See Comments) Muscle weakness Muscle weakness   Morphine Itching   Altace [Ramipril] Other (See Comments)    unknown   Floxin [Ofloxacin] Other (See Comments)    unknown   Hydromorphone Other (See Comments)    Other reaction(s): Confusion (intolerance) unknown   Lovaza [Omega-3-Acid Ethyl Esters] Other (See Comments)    Muscle weakness    Trilipix [Choline Fenofibrate] Other (See Comments)    Muscle weakness    Zetia [Ezetimibe] Other (See Comments)    Muscle weakness     Chief Complaint  Patient presents with   Medical Management of Chronic Issues                        Chronic pain associated with significant psychosocial dysfunction: Iron deficiency anemia associated with chronic blood loss: Aortic atherosclerosis:  Hyperlipidemia LDL goal <100:    HPI:  She is a 81 year old long term resident of this facility being seen for the management of her chronic illnesses:    Chronic pain associated with significant psychosocial dysfunction: Iron deficiency anemia associated with chronic blood loss: Aortic atherosclerosis:  Hyperlipidemia LDL goal <100. She has been treated for c-diff infection with vancomycin and dificid. Her stools are now more formed and is going several times daily. There are no reports of uncontrolled pain. She remains on tube feeding.   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 Abnl nuc 11/2014. Cath 12/2014 - turned down for CABG. Ultimately s/p TTVP, rotational atherectomy, PTCA and stenting of the ostial LCx and left main into the LAD (crush technique), and IVUS of the LAD/Left main. // b. Myoview 11/17: EF 48, poor quality/significant artifact; inf-lateral, inferior ischemia; Intermediate Risk   Chronic atrial fibrillation ([place])    a.  First noted post-op 9/15 spinal fusion.  She had cardioversion, not on anticoagulation. Fall risk, unsteady. // failed DCCV // Holter 10/17: AFib, Avg HR 97, PVCs, no other arrhythmia   Chronic back pain greater than 3 months duration    a. spinal stenosis.  Spinal fusion with rods in 2/15 at [facility] spinal fusion 9/15.   Chronic diastolic CHF    Echo 0/38:  EF 50-55%, trivial AI, midl MR, mod LAE, PASP 37 mmHg // Echocardiogram 03/2020: EF 45-50, no RWMA, mild LVH, mild reduced RVSF, mildly elevated PASP (RVSP 38.3), mild LAE, mild MR, mild to mod TR, trivial AI // Echo 9/21: EF 45, mild LVH, mildly reduced RV SF, moderate LAE, mild RAE, mild MR, mild AI      Circadian rhythm sleep disorder    CTS (carpal tunnel syndrome)  Deficiency anemia 11/10/2014   Depression    Diarrhea    Diverticulitis of colon    Esophageal stricture    Gastritis    Gastroesophageal hernia 07/06/2013   GERD (gastroesophageal reflux disease)    Hiatal hernia    History of blood transfusion    "most of them related to OR's"    HTN (hypertension)    Hyperlipidemia    IBS (irritable bowel syndrome)    Incontinence 10/13/2012   Insomnia    Ischemic chest pain ([place])    Memory disorder 12/23/7251   Metabolic syndrome 66/44/0347   MGUS (monoclonal gammopathy of unknown significance) dx'd 11/2014   a. Neg BMB 11/2014.   Multiple  falls    Obesity    Obstructive sleep apnea    "have mask; don't wear it" (01/07/2015)   Orthostasis    PAT (paroxysmal atrial tachycardia) ([place])    Personal history of colonic polyps 10/25/2011 & 12/02/11   not retrieved Dr Lyla Son & tubular adenomas   Pneumonia 03/2014   Sinus bradycardia    a. Baseline HR 50s-60s.   Stroke [facility]) early 2000's   "small"; denies residual on 01/07/2015)    Past Surgical History:  Procedure Laterality Date   BACK SURGERY     BONE MARROW BIOPSY  11/2014   CARDIAC CATHETERIZATION  12/27/2014   Procedure: INTRAVASCULAR PRESSURE WIRE/FFR STUDY;  Surgeon: Larey Dresser, MD;  Location: [facility] CATH LAB;  Service: Cardiovascular;;   CARDIAC CATHETERIZATION N/A 11/25/2016   Procedure: Left Heart Cath and Coronary Angiography;  Surgeon: Sherren Mocha, MD;  Location: [facility] CV LAB;  Service: Cardiovascular;  Laterality: N/A;   CARDIOVERSION N/A 02/13/2016   Procedure: CARDIOVERSION;  Surgeon: Josue Hector, MD;  Location: [facility];  Service: Cardiovascular;  Laterality: N/A;   CARPAL TUNNEL RELEASE Right 1980's   CATARACT EXTRACTION Bilateral    CATARACT EXTRACTION W/ INTRAOCULAR LENS  IMPLANT, BILATERAL  2000's   CORONARY ANGIOPLASTY WITH STENT PLACEMENT  01/07/2015   "2"   CORONARY STENT PLACEMENT     DILATION AND CURETTAGE OF UTERUS     ESOPHAGOGASTRODUODENOSCOPY (EGD) WITH ESOPHAGEAL DILATION  "several times"   JOINT REPLACEMENT     KNEE ARTHROSCOPY Left 1995   LAPAROSCOPIC CHOLECYSTECTOMY  2003   LEFT HEART CATHETERIZATION WITH CORONARY ANGIOGRAM N/A 12/27/2014   Procedure: LEFT HEART CATHETERIZATION WITH CORONARY ANGIOGRAM;  Surgeon: Larey Dresser, MD;  Location: [facility] CATH LAB;  Service: Cardiovascular;  Laterality: N/A;   PERCUTANEOUS CORONARY ROTOBLATOR INTERVENTION (PCI-R)  01/07/2015   PERCUTANEOUS CORONARY ROTOBLATOR INTERVENTION (PCI-R) N/A 01/07/2015   Procedure: PERCUTANEOUS CORONARY ROTOBLATOR INTERVENTION (PCI-R);  Surgeon: Blane Ohara, MD;  Location: [facility] CATH LAB;  Service: Cardiovascular;  Laterality: N/A;   POSTERIOR FUSION THORACIC SPINE  08/2015   POSTERIOR LUMBAR FUSION  01/2015   TOTAL KNEE ARTHROPLASTY Bilateral 1990's - 2000's    Social History   Socioeconomic History   Marital status: Widowed    Spouse name: Not on file   Number of children: 2   Years of education: HS   Highest education level: Not on file  Occupational History   Occupation: Licensed conveyancer- retired    Comment: retired   Occupation: retired  Tobacco Use   Smoking status: Never   Smokeless tobacco: Never  Vaping Use   Vaping Use: Never used  Substance and Sexual Activity   Alcohol use: No    Comment: 01/07/2015 "glass of wine at Christmas, maybe"   Drug use: No  Sexual activity: Not Currently  Other Topics Concern   Not on file  Social History Narrative   Patient is right handed   Long term resident of [facility]    Social Determinants of Health   Financial Resource Strain: Not on file  Food Insecurity: Not on file  Transportation Needs: Not on file  Physical Activity: Inactive (04/04/2020)   Exercise Vital Sign    Days of Exercise per Week: 0 days    Minutes of Exercise per Session: 0 min  Stress: Stress Concern Present (04/04/2020)   [place]    Feeling of Stress : To some extent  Social Connections: Socially Isolated (04/04/2020)   Social Connection and Isolation Panel [NHANES]    Frequency of Communication with Friends and Family: Three times a week    Frequency of Social Gatherings with Friends and Family: Once a week    Attends Religious Services: Never    Marine scientist or Organizations: No    Attends Archivist Meetings: Never    Marital Status: Widowed  Human resources officer Violence: Not on file   Family History  Problem Relation Age of Onset   Coronary artery disease Father    Peripheral vascular disease Father    Coronary artery  disease Mother    Coronary artery disease Brother    Colon cancer Sister    Emphysema Sister    Sleep apnea Son    Colon cancer Sister        spread to her brain   Emphysema Brother    Dementia Neg Hx       VITAL SIGNS BP 135/69   Pulse 80   Temp 97.9 F (36.6 C)   Resp 20   Ht $R'5\' 6"'wy$  (1.676 m)   Wt 174 lb (78.9 kg)   SpO2 97%   BMI 28.08 kg/m   Outpatient Encounter Medications as of 08/10/2022  Medication Sig   acetaminophen (TYLENOL) 160 MG/5ML liquid Take 20 mLs by mouth every 8 (eight) hours as needed for fever.   albuterol (VENTOLIN HFA) 108 (90 Base) MCG/ACT inhaler Inhale 2 puffs into the lungs every 6 (six) hours as needed for wheezing or shortness of breath.   Amino Acids-Protein Hydrolys (PRO-STAT PO) Take 30 mLs by mouth 3 (three) times daily. In tube   aspirin EC 81 MG tablet Take 81 mg by mouth daily. Swallow whole.   Balsam [place]-Castor Oil (VENELEX) OINT Apply 1 application. topically daily.   busPIRone (BUSPAR) 7.5 MG tablet Take 7.5 mg by mouth 3 (three) times daily.   Cholecalciferol 25 MCG (1000 UT) tablet Place 1,000 Units into feeding tube daily. 9 am   clobetasol (TEMOVATE) 0.05 % external solution Apply 1 application. topically 2 (two) times a week. Monday and Friday   diclofenac Sodium (VOLTAREN) 1 % GEL Apply 2 g topically 3 (three) times daily. to neck and shoulders for pain management   diltiazem (CARDIZEM) 120 MG tablet Take 120 mg by mouth every 8 (eight) hours.   ELIQUIS 5 MG TABS tablet Take 1 tablet (5 mg total) by mouth 2 (two) times daily.   eszopiclone 3 MG TABS Take 1 tablet (3 mg total) by mouth at bedtime. Take immediately before bedtime   fentaNYL (DURAGESIC) 50 MCG/HR Place 1 patch onto the skin every 3 (three) days.   FLUoxetine (PROZAC) 20 MG/5ML solution Place 60 mg into feeding tube daily.   furosemide (LASIX) 40 MG tablet Place 40 mg  into feeding tube 2 (two) times daily. 9 am   ipratropium-albuterol (DUONEB) 0.5-2.5 (3) MG/3ML SOLN  Take 3 mLs by nebulization every 6 (six) hours as needed.   LINZESS 145 MCG CAPS capsule Place 145 mcg into feeding tube daily as needed (constipation).   meclizine (ANTIVERT) 25 MG tablet Place 25 mg into feeding tube 3 (three) times daily as needed for dizziness.   morphine (ROXANOL) 20 MG/ML concentrated solution Take 5 mg by mouth every 2 (two) hours as needed for severe pain.   naloxone (NARCAN) nasal spray 4 mg/0.1 mL Special Instructions: As needed for depressed respirations less than 8/minute. Notify provider if given. If no effectiveness or improvement, contact 911. As Needed   nitroGLYCERIN (NITROSTAT) 0.4 MG SL tablet Place 1 tablet (0.4 mg total) under the tongue every 5 (five) minutes as needed for chest pain (up to 3 doses).   Omeprazole-Sodium Bicarbonate (KONVOMEP) 2-84 MG/ML SUSR Give 10 mLs by tube in the morning and at bedtime.   potassium chloride 20 MEQ/15ML (10%) SOLN Take 20 mEq by mouth daily. For potassium supplement   rOPINIRole (REQUIP) 0.5 MG tablet Place 0.5 mg into feeding tube in the morning and at bedtime.   rosuvastatin (CRESTOR) 10 MG tablet Place 10 mg into feeding tube daily. 9 am   saccharomyces boulardii (FLORASTOR) 250 MG capsule Take 250 mg by mouth 2 (two) times daily.   No facility-administered encounter medications on file as of 08/10/2022.     SIGNIFICANT DIAGNOSTIC EXAMS  PREVIOUS   02-23-22: TEE:  The left ventricular size is normal.  Mild left ventricular hypertrophy  LV ejection fraction = 45-50%.  Left ventricular systolic function is mildly reduced.  The right ventricle is normal in size and function.  The left atrium is moderately to severely dilated.  The right atrium is mildly to moderately dilated.  Estimated right ventricular systolic pressure is 27 mmHg.  IVC size was normal.  There is no significant valvular stenosis or regurgitation.  There is no pericardial effusion.  There is a pleural effusion present.  Recommend a limited  TTE when HR is controlled to reassess   05-23-22: chest x-ray: mild congestive heart failure  07-14-22: chest x-ray:  Changes of bibasilar atelectasis  Nonspecific perihilar inflammatory changes Bilateral spinal fusion hardware   07-20-22: chest x-ray:  Nonspecific perihilar changes Emphysema No improvement compared to 07-14-22.   07-21-22: chest x-ray:  No acute pulmonary process.   NO NEW EXAMS.   LABS REVIEWED: PREVIOUS   02-27-22: glucose 102; bun 13; creat 0.52; k+ 4.8; na++ 134; ca 8.9; GFR>90 04-08-22: wbc 7.2 hgb 7.2; hct 11.9; mcv 35.2 plt 185; glucose 114; bun 11; creat 0.37; k+ 4.5; na++ 132; ca 8.6; GFR >90 05-21-22: glucose 113; bun 19; creat 0.5; k+ 4.2; na++ 135; ca 8.5 gfr>60; protein 5.2; albumin 2.4  05-23-22: wbc 5.4; hgb 9.2; hct 29.6 plt 184; glucose 123; bun 21; creat 0.54; k+ 3.8; na++ 8.2; ca 8.2 gfr >60 07-01-22: glucose 91; bun 16; creat 0.44; k+ 4.1; na++ 135; ca 8.6; gfr>60 07-14-22: wbc 9.9; hgb 10.8; hct 35.3; mcv 83.8 plt 283; glucose 168; bun 24; creat 0.52; k+ 4.2; na++ 133; ca 8.6 gfr >60 urine culture:  blood culture: klebsiella pneumoniae  07-15-22: wbc 7.1; hgb 9.4; hct 31.2; mcv 85.2 plt 246; glucose 134; bun 28; creat 0.52; k+ 4.6; na++ 135; ca 8.2; gfr >60; protein 6.2 albumin 2.5; ast 50 alt 85 alk phos 283  07-19-22: blood culture: no growth  07-20-22: wbc 6.2; hgb 9.9; hct 33.2; mcv 84.9 plt 270; glucose 121; bun 24; creat 0.56; k+ 3.6; na++ 135; ca 8.5 gfr >60; protein 6.3 albumin 2.4; ast 31; alt 65; alk phos 286 07-21-22: c-dff +  TODAY  07-23-22: wbc 10.6; hgb 35.1; hct 35.1; mcv 84.2 plt 244; glucose 113; bun 27; creat 0.57; k+ 4.6; na++ 135; ca 9.0; gfr >60; ast 44 alt 58; alk phos 258; protein 6.6; albumin 2.6    Review of Systems  Constitutional:  Negative for malaise/fatigue.  Respiratory:  Negative for cough and shortness of breath.   Cardiovascular:  Negative for chest pain, palpitations and leg swelling.  Gastrointestinal:  Negative for  abdominal pain, constipation and heartburn.  Musculoskeletal:  Negative for back pain, joint pain and myalgias.  Skin: Negative.   Neurological:  Negative for dizziness.  Psychiatric/Behavioral:  The patient is not nervous/anxious.    Physical Exam Constitutional:      General: She is not in acute distress.    Appearance: She is well-developed. She is not diaphoretic.  Neck:     Thyroid: No thyromegaly.  Cardiovascular:     Rate and Rhythm: Normal rate and regular rhythm.     Pulses: Normal pulses.     Heart sounds: Normal heart sounds.  Pulmonary:     Effort: Pulmonary effort is normal. No respiratory distress.     Breath sounds: Normal breath sounds.  Abdominal:     General: Bowel sounds are normal. There is no distension.     Palpations: Abdomen is soft.     Tenderness: There is no abdominal tenderness.     Comments: Peg tube present   Musculoskeletal:        General: Normal range of motion.     Cervical back: Neck supple.     Right lower leg: No edema.     Left lower leg: No edema.  Lymphadenopathy:     Cervical: No cervical adenopathy.  Skin:    General: Skin is warm and dry.  Neurological:     Mental Status: She is alert and oriented to person, place, and time.  Psychiatric:        Mood and Affect: Mood normal.       ASSESSMENT/ PLAN:  TODAY  1. Chronic pain associated with significant psychosocial dysfunction: will continue duragesic 50 mcg patch every 3 days; is off vicodin.  2. Iron deficiency anemia associated with chronic blood loss: hgb 10.6 will monitor  3. Aortic atherosclerosis: (ct 03-17-22) is on statin and asa   4. Hyperlipidemia LDL goal <100: will continue crestor 10 mg daily   PREVIOUS   5. Irritable bowel syndrome with both constipation and diarrhea: will continue linzess 145 mcg daily as needed  has lomotil and imodium as needed  6. Insomnia: will continue lunesta 3 mg nightly   7. Restless leg syndrome: will continue requip 0.5 mg  twice daily   8. Esophageal stricture/gastroesophageal reflux disease without esophagitis/ paraesophageal hernia: will continue konvopem 2-84 mg/ml 10 cc twice daily is on tube feeding.   9. Chronic diastolic CHF (chronic congestive heart failure) EF40-45%; will continue lasix 40 mg daily   10. Permanent atrial fibrillation: heart rate is stable will continue diltiazem 120 mg daily for rate control eliquis 5 mg twice daily   11. Coronary artery disease involving native artery of native heart with unstable angina: will continue asa 81 mg daily has prn ntg.   12. Major depression recurrent chronic: is stable will continue prozac  60 mg daily and buspar 5 mg twice daily for anxiety   13. Elevated liver enzymes: will get a liver ultrasound and will monitor     Ok Edwards NP [facility]  call [phone]

## 2022-08-21 ENCOUNTER — Other Ambulatory Visit: Payer: Self-pay | Admitting: Adult Health

## 2022-08-21 ENCOUNTER — Encounter (HOSPITAL_COMMUNITY)
Admission: RE | Admit: 2022-08-21 | Discharge: 2022-08-21 | Disposition: A | Discharge status: Home / Self Care | Payer: Medicare HMO | Source: Skilled Nursing Facility | Attending: Adult Health | Admitting: Adult Health

## 2022-08-21 DIAGNOSIS — B961 Klebsiella pneumoniae [K. pneumoniae] as the cause of diseases classified elsewhere: Secondary | ICD-10-CM | POA: Diagnosis present

## 2022-08-21 DIAGNOSIS — R051 Acute cough: Secondary | ICD-10-CM | POA: Insufficient documentation

## 2022-08-21 MED ORDER — ESZOPICLONE 3 MG PO TABS
3.0000 mg | ORAL_TABLET | Freq: Every day | ORAL | 0 refills | Status: DC
Start: 2022-08-21 — End: 2022-09-23

## 2022-08-21 MED ORDER — FENTANYL 50 MCG/HR TD PT72
1.0000 | MEDICATED_PATCH | TRANSDERMAL | 0 refills | Status: DC
Start: 2022-08-21 — End: 2022-09-23

## 2022-08-21 NOTE — Progress Notes (Signed)
Clinical diagnoses: aspiration pneumonia with tachycardia, fever & abnormal chest Xray.

## 2022-08-25 ENCOUNTER — Non-Acute Institutional Stay (SKILLED_NURSING_FACILITY): Payer: Medicare HMO | Admitting: Internal Medicine

## 2022-08-25 ENCOUNTER — Encounter: Payer: Self-pay | Admitting: Internal Medicine

## 2022-08-25 DIAGNOSIS — R7401 Elevation of levels of liver transaminase levels: Secondary | ICD-10-CM | POA: Diagnosis not present

## 2022-08-25 DIAGNOSIS — R1319 Other dysphagia: Secondary | ICD-10-CM

## 2022-08-25 DIAGNOSIS — F339 Major depressive disorder, recurrent, unspecified: Secondary | ICD-10-CM

## 2022-08-25 DIAGNOSIS — E43 Unspecified severe protein-calorie malnutrition: Secondary | ICD-10-CM

## 2022-08-25 NOTE — Patient Instructions (Signed)
See assessment and plan under each diagnosis in the problem list and acutely for this visit 

## 2022-08-25 NOTE — Progress Notes (Signed)
NURSING HOME LOCATION:  [facility] ROOM NUMBER:  159  CODE STATUS:  DNR  PCP:  Ok Edwards NP  This is a nursing facility follow up visit of chronic medical diagnoses & to document compliance with Regulation 483.30 (c) in The [place] Manual Phase 2 which mandates caregiver visit ( visits can alternate among physician, PA or NP as per statutes) within 10 days of 30 days / 60 days/ 90 days post admission to SNF date    Interim medical record and care since last SNF visit was updated with review of diagnostic studies and change in clinical status since last visit were documented.  HPI: She is a permanent resident of facility with medical diagnoses of history of iron deficiency anemia, CAD, chronic atrial fibrillation, chronic diastolic congestive heart failure, GERD with history of esophageal stricture, essential hypertension, dyslipidemia, IBS, OSA, history of monoclonal gammopathy of unknown significance, and history of stroke. Surgeries and procedures include cardiac catheterization, cardioversion, coronary angioplasty with stent placement, laparoscopic cholecystectomy, and esophageal dilation.  Labs 07/23/2022 revealed elevated alkaline phosphatase of 258, decreased albumin at 2.6, and transaminitis with AST of 44 and ALT of 58. ALT has improved from 65 but AST was 31. Total protein was normal at 6.6, improved from a nadir total protein of 6 on 7/7.  Hypochromic normocytic anemia was present with H/H of 10.6/35.1.  Review of systems: When asked how she were doing her response was "good days and bad days."  Her major problem is chronic low back pain which she describes as throbbing and nonradicular in character.   She also describes "diarrhea" which is described as variable from loose to frankly watery and green to yellow in color.  She denies any coke colored urine or white stool.  Her dysphagia is "much better."  She is on PEG tube feedings in addition to  Jell-O and fluids without solid food.  She wants to have another swallowing test "so I can eat."  She mentions such foods as crackers crumbled up in soup.   She describes chronic pain in the right foot described as soreness dorsally.  She attributes this to having worn TED hose and believes that she is "allergic to them."  Despite her complaint of pain in the foot, she states she has numbness in her feet. When asked about depression her response was "how could I not be depressed knowing that I am here for good?"  Constitutional: No fever, significant weight change Eyes: No redness, discharge, pain, vision change ENT/mouth: No nasal congestion,  purulent discharge, earache, change in hearing, sore throat  Cardiovascular: No chest pain, palpitations, paroxysmal nocturnal dyspnea, claudication, edema  Respiratory: No cough, sputum production, hemoptysis, DOE, significant snoring, apnea   Gastrointestinal: No heartburn, abdominal pain, nausea /vomiting, rectal bleeding, melena Genitourinary: No dysuria, hematuria, pyuria, incontinence, nocturia Dermatologic: No rash, pruritus, change in appearance of skin Neurologic: No dizziness, headache, syncope, seizures Psychiatric: No significant insomnia, anorexia Endocrine: No change in hair/skin/nails, excessive thirst, excessive hunger, excessive urination  Hematologic/lymphatic: No significant bruising, lymphadenopathy, abnormal bleeding Allergy/immunology: No itchy/watery eyes, significant sneezing, urticaria, angioedema  Physical exam:  Pertinent or positive findings: Voice is somewhat weak.  Facies appear somewhat "perplexed" and near tears.  In fact when she discussed depression she did exhibit some tearing. She is not wearing her upper and lower partials and has multiple missing teeth in the of the maxilla and mandible.  Heart sounds are distant and heart rhythm is irregular.  She has minor basilar rales posteriorly.  PEG is present and infusion is in  progress.  She has nonpitting edema.  Pedal pulses are decreased.  The great toenails are absent bilaterally.  The other toenails are thickened and somewhat yellow.  She has a bunion on the right lateral distal foot.  General appearance: no acute distress, increased work of breathing is present.   Lymphatic: No lymphadenopathy about the head, neck, axilla. Eyes: No conjunctival inflammation or lid edema is present. There is no scleral icterus. Ears:  External ear exam shows no significant lesions or deformities.   Nose:  External nasal examination shows no deformity or inflammation. Nasal mucosa are pink and moist without lesions, exudates Neck:  No thyromegaly, masses, tenderness noted.    Heart:  No gallop, murmur, click, rub .  Lungs:  without wheezes, rhonchi, rubs. Abdomen: Bowel sounds are normal. Abdomen is soft and nontender with no organomegaly, hernias, masses. GU: Deferred  Extremities:  No cyanosis, clubbing Neurologic exam :Balance, Rhomberg, finger to nose testing could not be completed due to clinical state Skin: Warm & dry w/o tenting. No significant lesions or rash.  See summary under each active problem in the Problem List with associated updated therapeutic plan

## 2022-08-26 DIAGNOSIS — R7401 Elevation of levels of liver transaminase levels: Secondary | ICD-10-CM | POA: Insufficient documentation

## 2022-08-26 LAB — CULTURE, BLOOD (SINGLE): Culture: NO GROWTH

## 2022-08-26 NOTE — Assessment & Plan Note (Signed)
Current albumin 2.6 but total protein 6.6 in context of limited PO nutrition & maintenance PEG feeding.She states she is not eating soups provided. Nutrition to follow. Role of low albumin in peripheral edema discussed.

## 2022-08-26 NOTE — Assessment & Plan Note (Addendum)
Clinically she is very depressed. Unfortunately she has PMH of Cymbalta intolerance. Continue Prozac . The only meaningful intervention would be if she were cleared by Speech Therapy to advance diet.

## 2022-08-26 NOTE — Assessment & Plan Note (Addendum)
PMH of cholecystectomy. Alkaline phos 258, AST 44 & ALT 58, both essentially stable. Continue monitor & image if progressive.

## 2022-08-26 NOTE — Assessment & Plan Note (Signed)
She states this is "much better " & she wants to try soft foods or at least have repeat swallowiing study. Speech Therapy to follow.

## 2022-09-02 ENCOUNTER — Other Ambulatory Visit (HOSPITAL_COMMUNITY): Payer: Self-pay | Admitting: Adult Health

## 2022-09-02 DIAGNOSIS — Z431 Encounter for attention to gastrostomy: Secondary | ICD-10-CM

## 2022-09-04 ENCOUNTER — Ambulatory Visit (HOSPITAL_COMMUNITY): Admission: RE | Admit: 2022-09-04 | Payer: Medicare HMO | Source: Ambulatory Visit

## 2022-09-04 ENCOUNTER — Encounter (HOSPITAL_COMMUNITY): Payer: Self-pay

## 2022-09-08 ENCOUNTER — Ambulatory Visit (HOSPITAL_COMMUNITY)
Admission: RE | Admit: 2022-09-08 | Discharge: 2022-09-08 | Disposition: A | Discharge status: Home / Self Care | Payer: Medicare HMO | Source: Ambulatory Visit | Attending: Adult Health | Admitting: Adult Health

## 2022-09-08 DIAGNOSIS — Z431 Encounter for attention to gastrostomy: Secondary | ICD-10-CM | POA: Diagnosis present

## 2022-09-08 HISTORY — PX: IR REPLC GASTRO/COLONIC TUBE PERCUT W/FLUORO: IMG2333

## 2022-09-08 MED ORDER — LIDOCAINE VISCOUS HCL 2 % MT SOLN
OROMUCOSAL | Status: AC
  Filled 2022-09-08: qty 15

## 2022-09-08 MED ORDER — IOHEXOL 300 MG/ML  SOLN
50.0000 mL | Freq: Once | INTRAMUSCULAR | Status: AC | PRN
  Administered 2022-09-08: 10 mL

## 2022-09-15 ENCOUNTER — Other Ambulatory Visit (HOSPITAL_COMMUNITY): Payer: Self-pay | Admitting: Specialist

## 2022-09-15 DIAGNOSIS — K219 Gastro-esophageal reflux disease without esophagitis: Secondary | ICD-10-CM

## 2022-09-15 DIAGNOSIS — R1312 Dysphagia, oropharyngeal phase: Secondary | ICD-10-CM

## 2022-09-21 ENCOUNTER — Encounter (HOSPITAL_COMMUNITY): Payer: Self-pay | Admitting: Speech Pathology

## 2022-09-21 ENCOUNTER — Ambulatory Visit (HOSPITAL_COMMUNITY)
Admission: RE | Admit: 2022-09-21 | Discharge: 2022-09-21 | Disposition: A | Discharge status: Home / Self Care | Payer: Medicare HMO | Source: Ambulatory Visit | Attending: Adult Health | Admitting: Adult Health

## 2022-09-21 ENCOUNTER — Ambulatory Visit (HOSPITAL_COMMUNITY): Payer: Medicare HMO | Attending: Adult Health | Admitting: Speech Pathology

## 2022-09-21 DIAGNOSIS — K219 Gastro-esophageal reflux disease without esophagitis: Secondary | ICD-10-CM | POA: Insufficient documentation

## 2022-09-21 DIAGNOSIS — R1312 Dysphagia, oropharyngeal phase: Secondary | ICD-10-CM | POA: Diagnosis present

## 2022-09-21 NOTE — Therapy (Signed)
[name], [place], [address] Phone: [phone]   Fax:  [phone]  Modified Barium Swallow  Patient Details  Name: [name] MRN: [id number] Date of Birth: 11-28-1941 No data recorded  Encounter Date: 09/21/2022   End of Session - 09/21/22 1421     Visit Number 1    Number of Visits 1    Authorization Type [employer]    SLP Start Time 1205    SLP Stop Time  1235    SLP Time Calculation (min) 30 min    Activity Tolerance Patient tolerated treatment well             Past Medical History:  Diagnosis Date   Abnormal nuclear cardiac imaging test    Anemia, iron deficiency 07/02/2015   Anxiety    Arthritis    "knees, back, fingers, toes; joints" (01/07/2015)   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 Abnl nuc 11/2014. Cath 12/2014 - turned down for CABG. Ultimately s/p TTVP, rotational atherectomy, PTCA and stenting of the ostial LCx and left main into the LAD (crush technique), and IVUS of the LAD/Left main. // b. Myoview 11/17: EF 48, poor quality/significant artifact; inf-lateral, inferior ischemia; Intermediate Risk   Chronic atrial fibrillation ([place])    a.  First noted post-op 9/15 spinal fusion.  She had cardioversion, not on anticoagulation. Fall risk, unsteady. // failed DCCV // Holter 10/17: AFib, Avg HR 97, PVCs, no other arrhythmia   Chronic back pain greater than 3 months duration    a. spinal stenosis.  Spinal fusion with rods in 2/15 at [facility] spinal fusion 9/15.   Chronic diastolic CHF    Echo 6/96:  EF 50-55%, trivial AI, midl MR, mod LAE, PASP 37 mmHg // Echocardiogram 03/2020: EF 45-50, no RWMA, mild LVH, mild reduced RVSF, mildly elevated PASP (RVSP 38.3), mild LAE, mild MR, mild to mod TR, trivial AI // Echo 9/21: EF 45, mild LVH, mildly reduced RV SF, moderate LAE, mild RAE, mild MR, mild AI      Circadian rhythm sleep disorder    CTS (carpal  tunnel syndrome)    Deficiency anemia 11/10/2014   Depression    Diarrhea    Diverticulitis of colon    Esophageal stricture    Gastritis    Gastroesophageal hernia 07/06/2013   GERD (gastroesophageal reflux disease)    Hiatal hernia    History of blood transfusion    "most of them related to OR's"    HTN (hypertension)    Hyperlipidemia    IBS (irritable bowel syndrome)    Incontinence 10/13/2012   Insomnia    Ischemic chest pain ([place])    Memory disorder 29/52/8413   Metabolic syndrome 24/40/1027   MGUS (monoclonal gammopathy of unknown significance) dx'd 11/2014   a. Neg BMB 11/2014.   Multiple falls    Obesity    Obstructive sleep apnea    "have mask; don't wear it" (01/07/2015)   Orthostasis    PAT (paroxysmal atrial tachycardia)    Personal history of colonic polyps 10/25/2011 & 12/02/11   not retrieved Dr Lyla Son & tubular adenomas   Pneumonia 03/2014   Sinus bradycardia    a. Baseline HR 50s-60s.   Stroke [facility]) early 2000's   "small"; denies residual on 01/07/2015)    Past Surgical History:  Procedure Laterality Date   BACK SURGERY     BONE MARROW BIOPSY  11/2014  CARDIAC CATHETERIZATION  12/27/2014   Procedure: INTRAVASCULAR PRESSURE WIRE/FFR STUDY;  Surgeon: Larey Dresser, MD;  Location: [facility] CATH LAB;  Service: Cardiovascular;;   CARDIAC CATHETERIZATION N/A 11/25/2016   Procedure: Left Heart Cath and Coronary Angiography;  Surgeon: Sherren Mocha, MD;  Location: [place] CV LAB;  Service: Cardiovascular;  Laterality: N/A;   CARDIOVERSION N/A 02/13/2016   Procedure: CARDIOVERSION;  Surgeon: Josue Hector, MD;  Location: [place];  Service: Cardiovascular;  Laterality: N/A;   CARPAL TUNNEL RELEASE Right 1980's   CATARACT EXTRACTION Bilateral    CATARACT EXTRACTION W/ INTRAOCULAR LENS  IMPLANT, BILATERAL  2000's   CORONARY ANGIOPLASTY WITH STENT PLACEMENT  01/07/2015   "2"   CORONARY STENT PLACEMENT     DILATION AND CURETTAGE OF UTERUS      ESOPHAGOGASTRODUODENOSCOPY (EGD) WITH ESOPHAGEAL DILATION  "several times"   IR [place] W/FLUORO  09/08/2022   JOINT REPLACEMENT     KNEE ARTHROSCOPY Left 1995   LAPAROSCOPIC CHOLECYSTECTOMY  2003   LEFT HEART CATHETERIZATION WITH CORONARY ANGIOGRAM N/A 12/27/2014   Procedure: LEFT HEART CATHETERIZATION WITH CORONARY ANGIOGRAM;  Surgeon: Larey Dresser, MD;  Location: [facility] CATH LAB;  Service: Cardiovascular;  Laterality: N/A;   PERCUTANEOUS CORONARY ROTOBLATOR INTERVENTION (PCI-R)  01/07/2015   PERCUTANEOUS CORONARY ROTOBLATOR INTERVENTION (PCI-R) N/A 01/07/2015   Procedure: PERCUTANEOUS CORONARY ROTOBLATOR INTERVENTION (PCI-R);  Surgeon: Blane Ohara, MD;  Location: [facility] CATH LAB;  Service: Cardiovascular;  Laterality: N/A;   POSTERIOR FUSION THORACIC SPINE  08/2015   POSTERIOR LUMBAR FUSION  01/2015   TOTAL KNEE ARTHROPLASTY Bilateral 1990's - 2000's    There were no vitals filed for this visit.        General - 09/21/22 1409       General Information   Date of Onset 08/31/22    HPI [name] is an 81 yo female who was referred by Ok Edwards, NP for MBSS. Pt has an extensive dysphagia history, primarily esophageal (see below).   Type of Study MBS-Modified Barium Swallow Study    Previous Swallow Assessment MBSS 03/31/22-NPO  FEES 06/16/22 full liquids rec    Diet Prior to this Study Thin liquids    Temperature Spikes Noted No    Respiratory Status Room air    History of Recent Intubation No    Behavior/Cognition Alert;Cooperative;Pleasant mood    Oral Cavity Assessment Within Functional Limits    Oral Care Completed by SLP No    Oral Cavity - Dentition Adequate natural dentition    Vision Functional for self feeding    Self-Feeding Abilities Able to feed self    Patient Positioning Upright in chair    Baseline Vocal Quality Normal    Volitional Cough Strong    Volitional Swallow Able to elicit    Anatomy Within functional limits   cervical  osteophytes   Pharyngeal Secretions Not observed secondary MBS              Adult Oral Care Protocol - 09/21/22 1415       Oral Assessment (Complete on admission/transfer/every shift)   Oral Assessment  (WDL) Within Defined Limits            Dysphagia History:  <<06/16/22 FEES: Impressions: Patient presents with moderate pharyngeal dysphagia impacted by age-related changes and deconditioning. Dysphagia is characterized by delayed swallow initiation, reduced bolus propulsion, incomplete airway closure, and seemingly decreased UES distention. Deficits result in frequent thin/nectar-thick liquid penetration, transient penetration over interarytenoid space across consistencies,  and trace ranging to severe pharyngeal residue that worsened with viscosity and reduced with multiple effortful swallows and/or liquid wash. No aspiration upon thorough challenging. Patient was educated on risks and adverse outcomes associated with dysphagia/aspiration and reviewed the relative cost/benefits of management options: nonoral nutrition/NPO vs nonoral nutrition/oral intake for comfort vs full oral diet and thin vs nectar-thick liquids. Patient reported interest in drinking thin liquids. Following discussion, recommend patient initiate a full thin liquid diet with adherence to aspiration precautions below and continue g-tube use to promote stable nutrition. Recommend short term follow-up via MBS to better characterize UES distention/other biomechanics during the swallow and assess effectiveness of strategies. Discussed swallowing therapy though plan to re-evaluate following MBS. Patient and family member verbalized understanding of all exam results and recommendations. Exam results discussed with Dr. Joya Gaskins following the study. Will fax report to facility.  Recommendations: Diet: full thin liquid diet, continue g-tube use Aspiration Precautions: sit upright, slow rate, take small sips, use effortful swallow,  swallow at least 2-3x per bolus, cough/clear throat intermittently during meal Administer Medications: via alternate means Further Assessment Via: Pharyngeal Function Study (PFS) Other: evaluation with facility dietician regarding  02/18/2022 FEES: "Patient presents with mild-moderate pharyngeal and pharyngoesophageal dysphagia, likely multiphase dysphagia. Penetration/aspiration scores slightly worsened since she completed MBS in 2020. She is acutely post-op from paraesophageal hernia repair and heller myotomy, appearing deconditioned and requiring oxygen via nasal cannula. Pharyngeal phase is characterized by decreased laryngeal closure resulting in stagnant penetration of thin liquid and puree, and suspected decreased UES distension (from CP bar and post-cricoid impression) resulting in mild post-swallow residuals with thin liquid and puree. Penetration was not cleared with completion of the swallow, but did clear with post-swallow throat clearing (both independently initiated and cued by SLP). No aspiration observed. One instance of mild regurgitation into the hypopharynx observed that was cleared with immediate, reflexive swallowing. No post-prandial penetration or aspiration observed. Patient refused pill capsule trial. Based on this evaluation, recommend Full Liquid diet (max diet allowed per Bariatric Surgery) with strict aspiration precautions, medications crushed in puree or liquid form. Recommend swallow therapy to address pharyngeal swallow deficits."  03/31/2022 MBS: "Patient presents with moderate-severe oropharyngeal dysphagia with significant hx of esophageal deficits. Patient's extensive GI history and esophageal deficits impacted bolus efficiency and ability to trial additional consistencies. Additionally, patient demonstrated anxiety regarding today's exam and benefited from positive encouragement to participate with study. The oral phase was characterized by delayed lingual transit and brief  oral holding, most likely due to patient's anxiety regarding swallowing. This resulted in a prolonged oral phase. Pharyngeal initiation is timely. Pharyngeal phase is characterized by incomplete laryngeal vestibule closure, reduced epiglottic inversion, reduced base of tongue retraction, diminished pharyngeal stripping wave, and reduced UES opening. Incomplete laryngeal vestibule closure resulted in stagnant penetration during trials of thin, nectar, and puree trials. Patient intermittently demonstrated a reflexive throat clear/cough and was able to partially clear penetration from vestibule. Reduced epiglottic inversion in combination with reduced base of tongue retraction and diminished pharyngeal stripping wave resulted in mild to moderate residuals in the valleculae and at the base of tongue. Patient's known esophageal deficits in combination with reduced UES opening resulted in moderate to severe residuals in the pyriforms and UES area. Residuals increased with increased viscosity and larger bolus sizes (when able to elicit). Patient unable to clear these residuals and often demonstrated regurgitation into pharyngeal and oral cavity. Patient self suctioned oral cavity with [place] when regurgitation occurred. Large sips were trialed, however ineffective  in clearing residuals and patient often self-limited bolus amount. Honey thick liquids, solids, and pill trials were deferred during study d/t patient's deficits. Swallowing safety is moderately impaired. Swallow efficiency is severely impacted by pharyngoesophageal deficits. Recommend to continue no oral diet with alternative means of nutrition, hydration, and medication. Patient able to consume ice chips and sips of water in moderation and after good and thorough oral care. Consider a dietician consult to ensure adequate nutrition met. Will continue to monitor and will discuss with medical team plan of care regarding additional swallow evaluations. " 03/27/22  EGD: "hx of hx of CAD (s/p PCI 2016), Afib w/ hx cardioversion in Eliquis, OSA, DM, hx CCY (2006), and laparoscopic Heller myotomy with paraesophageal hernia repair and gastropexy (02/17/22), knee surgery, posterior spinal fusion, who presented with persistent dysphagia and findings of an esophageal fluid collection.    EGD 2018 for dysphagia: very tortuous esophagus, large hiatal hernia, large inflammatory polyp in the stomach biopsied (path hyperplastic polyp)    Esophagram 10/2021: Absent/weak and incomplete primary wave. Extensive tertiary contractions predominantly in the distal esophagus. This pattern can be seen with diffuse esophageal spasm and with type 3 achalasia. Mild delay in barium tablet passage through the distal esophagus, likely related to dysmotility. No evidence of significant mass or stricture.    EGD 02/12/22: Large hernia that is creating esophagogastric junction outflow obstruction. Endoflip w/ low DI. Gastric biopsies w/ mild chronic inactive gastritis. 200 u Botox injected    Manometry 02/12/22: Findings of EGJOO, achalasia cannot be excluded    02/17/22: laparoscopic Heller myotomy with paraesophageal hernia repair and gastropexy, discharged 02/27/22. Did better for a week but w/ progressive/persistent dysphagia, inability to tolerate PO    Presented to [place] 03/17/22 w/ dysphagia.    CT chest/abd/pelvis 03/17/22: 1. Narrowing of the distal esophagus/gastroesophageal junction, with contrast medium in the mildly dilated esophagus leading up to this point. Along the distal esophageal margin on the left side there is a 120 cc rounded complex collection at the site of the prior hiatal hernia along the left lower chest, which could be from local hematoma, abscess, or less likely some sort of herniated dilated fundoplication wrap or diverticulum. This does not seem to fill with the adjacent dense contrast from the esophagus and there is no internal gas within this collection, does  not appear in continuity with the esophagus lumen    Esophagram 03/18/22: There is a 1.8 cm segment of narrowing of the distal esophagus measuring 3 to 4 mm in diameter at its thinnest. This could be related to extrinsic compression by the previous noted mediastinal collection. The distal esophagus proximal to the aforementioned narrowing is patulous with retained food debris.Marland KitchenMarland KitchenImpression  1. Sever post operative esophageal narrowing at GEJ dilated with 10-47m CRE dilation 2. Food in the stomach obstructed views of cardia/stomach Recommendation 1. Patient will need Dobhoff tube replacement (pulled out in recovery) 2. Management of post-op esophageal narrowing per MIS team" 05/27/22 Dr. AMonica Martinez(General Surgery): "presents for abdominal pain. She states she is not taking anything by mouth but wants to try ice chips. She was recently taken to the ED for a clogged G-tube which was successfully unclogged. She would like to see if she can swallow now..Marland KitchenMarland KitchenMaleya LeeverCase is a 81y.o. female who presents for follow up. She would like to see if she could swallow and if any of her symptoms have resolved. We will have her meet with the ENT team for a FEES to determine  if is safe to resume swallowing anything...40 minute discussion of issues related to pharyngeal and esophageal phase swallowing disorder. She has gained weight and looks physically well in comparison to post op and inpatient. It is reasonable to test her pharyngeal function, she is handling her secretions. It is unclear if her stasis/esophageal achalasia picture will allow her to eat, but the pharyngeal assessment would need to come first. I saw and evaluated the patient and reviewed the resident's note. I agree with the resident's findings>>   Oral Preparation/Oral Phase - 09/21/22 1415       Oral Preparation/Oral Phase   Oral Phase Within functional limits      Electrical stimulation - Oral Phase   Was Electrical Stimulation Used  No              Pharyngeal Phase - 09/21/22 1415       Pharyngeal Phase   Pharyngeal Phase Impaired      Pharyngeal - Thin   Pharyngeal- Thin Teaspoon Swallow initiation at vallecula;Reduced epiglottic inversion;Pharyngeal residue - pyriform    Pharyngeal- Thin Cup Swallow initiation at vallecula;Reduced epiglottic inversion;Penetration/Aspiration during swallow;Pharyngeal residue - pyriform    Pharyngeal Material enters airway, remains ABOVE vocal cords then ejected out    Pharyngeal- Thin Straw Swallow initiation at vallecula;Swallow initiation at pyriform sinus;Pharyngeal residue - pyriform      Pharyngeal - Solids   Pharyngeal- Puree Delayed swallow initiation;Reduced epiglottic inversion;Reduced tongue base retraction;Pharyngeal residue - valleculae;Pharyngeal residue - pyriform    Pharyngeal- Regular Delayed swallow initiation-vallecula;Reduced tongue base retraction;Reduced epiglottic inversion;Pharyngeal residue - valleculae;Pharyngeal residue - pyriform    Pharyngeal- Pill Within functional limits      Electrical Stimulation - Pharyngeal Phase   Was Electrical Stimulation Used No              Cricopharyngeal Phase - 09/21/22 1419       Cervical Esophageal Phase   Cervical Esophageal Phase Impaired      Cervical Esophageal Phase - Solids   Puree Reduced cricopharyngeal relaxation;Prominent cricopharyngeal segment;Esophageal backflow into cervical esophagus               Plan - 09/21/22 1422     Clinical Impression Statement Pt presents with min/mod pharyngeal phase dysphagia and suspected primary esophageal phase dysphagia characterized by min decreased tongue base retraction and epiglottic deflection (with appearance of osteophytes along cervial spine which likely negatively impact epiglottic deflection), and reduced relaxation of the UES/prominent cricopharyngeus resulting in ocasional flash penetration of thins, min vallecular residue with puree/solids,  and moderate pyriform residue post swallow (greater with puree and solids; less with liquids). Esophageal sweep revealed barium filled esophagus in the distal two thirds of the esophagus with some retrograde movement. Recommend regular textures vs mechanical soft per treating SLP at SNF and thin liquids via cup/straw, alternate solids and liquids, implement effortful/hard swallow with solids, and Pt to sit upright for all eating and drinking and 30+ minutes after (avoid bending over as well). Pt will likely need to proceed with solid foods cautiously to see if she experiences and regurgitation. Recommend f/u with SLP services at SNF. Ok for PO medication whole with water.             Patient will benefit from skilled therapeutic intervention in order to improve the following deficits and impairments:   Dysphagia, oropharyngeal phase     Recommendations/Treatment - 09/21/22 1420       Swallow Evaluation Recommendations   SLP Diet Recommendations Age appropriate regular;Thin  Liquid Administration via Cup;Straw    Medication Administration Whole meds with liquid    Supervision Patient able to self feed    Compensations Multiple dry swallows after each bite/sip;Follow solids with liquid    Postural Changes Seated upright at 90 degrees;Remain upright for at least 30 minutes after feeds/meals              Prognosis - 09/21/22 1420       Prognosis   Prognosis for Safe Diet [employer]    Barriers to Reach Goals Other (Comment)    Barriers/Prognosis Comment Severity of esophageal dysphagia deficits      Individuals Consulted   Consulted and Agree with Results and Recommendations Patient    Report Sent to  Referring physician             Problem List Patient Active Problem List   Diagnosis Date Noted   Transaminitis 08/26/2022   Oral thrush 07/22/2022   C. difficile colitis 07/21/2022   Sepsis with acute liver failure ([place]) 07/15/2022   Severe protein-calorie  malnutrition ([place]) 05/25/2022   Generalized osteoarthritis 04/22/2022   Aortic atherosclerosis ([place]) 04/15/2022   Restless leg syndrome 04/15/2022   At risk for adverse drug event 03/05/2022   Major depression, recurrent, chronic ([place]) 03/02/2022   Hyperlipidemia LDL goal <100 03/02/2022   Aspiration pneumonia ([place]) 03/02/2022   Acute exacerbation of CHF (congestive heart failure) ([place]) 10/24/2021   Arthritis of right hand 08/28/2021   Acquired trigger finger of right ring finger 03/55/9741   Lichen sclerosus et atrophicus 11/26/2020   Aspiration into airway 07/16/2020   Acquired trigger finger of right middle finger 07/09/2020   Osteoarthritis of first carpometacarpal joint of left hand 07/09/2020   Osteoarthritis of first carpometacarpal joint of right hand 07/09/2020   Dysphagia 03/25/2020   HOH (hard of hearing) 63/84/5364   Lichen sclerosus et atrophicus of the vulva 01/03/2019   Long-term current use of opiate analgesic 02/14/2018   Anxiety 07/21/2016   Chronic diastolic CHF (congestive heart failure) ([place]) 07/17/2015   Anemia due to chronic blood loss 07/02/2015   DDD (degenerative disc disease), lumbar 03/22/2015   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   Lumbar scoliosis 03/22/2015   MGUS (monoclonal gammopathy of unknown significance) 01/08/2015   Permanent atrial fibrillation ([place]) 12/22/2014   CAD (coronary artery disease) 12/22/2014   Angulation of spine 09/18/2014   HCAP (healthcare-associated pneumonia) 03/27/2014   S/P spinal fusion 02/23/2014   Chronic pain associated with significant psychosocial dysfunction 02/23/2014   Paraesophageal hiatal hernia 07/06/2013   Disaccharide malabsorption 11/24/2011   DEGENERATIVE JOINT DISEASE 04/08/2010   Obesity (BMI 30.0-34.9) 04/04/2009   CARPAL TUNNEL SYNDROME 04/04/2009   IRRITABLE BOWEL SYNDROME 11/13/2008   ESOPHAGEAL STRICTURE 11/12/2008   GERD 11/12/2008   Paralysis of diaphragm 11/12/2008   Obstructive sleep apnea  05/23/2008   Essential hypertension 05/22/2008   Insomnia 05/22/2008   Thank you,  Genene Churn, [place]  Genene Churn, [place] 09/21/2022, 2:30 PM  [facility] [address] [name], [place], [address] Phone: [phone]   Fax:  [phone]  Name: [name] [name] MRN: [id number] Date of Birth: [birth year]

## 2022-09-22 ENCOUNTER — Non-Acute Institutional Stay (SKILLED_NURSING_FACILITY): Payer: Medicare HMO | Admitting: Adult Health

## 2022-09-22 ENCOUNTER — Encounter: Payer: Self-pay | Admitting: Adult Health

## 2022-09-22 DIAGNOSIS — G2581 Restless legs syndrome: Secondary | ICD-10-CM

## 2022-09-22 DIAGNOSIS — K2100 Gastro-esophageal reflux disease with esophagitis, without bleeding: Secondary | ICD-10-CM

## 2022-09-22 DIAGNOSIS — F5101 Primary insomnia: Secondary | ICD-10-CM | POA: Diagnosis not present

## 2022-09-22 DIAGNOSIS — K222 Esophageal obstruction: Secondary | ICD-10-CM | POA: Diagnosis not present

## 2022-09-22 DIAGNOSIS — K582 Mixed irritable bowel syndrome: Secondary | ICD-10-CM | POA: Diagnosis not present

## 2022-09-22 NOTE — Progress Notes (Signed)
Location:  [place] Room Number: 159-P Place of Service:  SNF (31)   CODE STATUS: DNR  Allergies  Allergen Reactions   Buprenorphine Hcl Itching   Cymbalta [Duloxetine Hcl] Other (See Comments)    Other reaction(s): Other (See Comments) Personality changes - crying  2014   Morphine And Related Itching   Oxycodone-Acetaminophen Other (See Comments)    Personality changes    Valium [Diazepam] Other (See Comments)    Personality changes - "in another world" ;  Hallucinations  2015   Statins Other (See Comments)    Other reaction(s): Other (See Comments) Muscle weakness Muscle weakness   Morphine Itching   Altace [Ramipril] Other (See Comments)    unknown   Floxin [Ofloxacin] Other (See Comments)    unknown   Hydromorphone Other (See Comments)    Other reaction(s): Confusion (intolerance) unknown   Lovaza [Omega-3-Acid Ethyl Esters] Other (See Comments)    Muscle weakness    Trilipix [Choline Fenofibrate] Other (See Comments)    Muscle weakness    Zetia [Ezetimibe] Other (See Comments)    Muscle weakness     Chief Complaint  Patient presents with   Medical Management of Chronic Issues                   Irritable bowel syndrome with both constipation and diarrhea:. Insomnia:  Restless leg syndrome: Esophageal stricture/gastroesophageal reflux disease without esophagitis/paraesophageal hernia;    HPI:  She is a 81 year old long term resident of this facility being seen for the management of her chronic illnesses:  Irritable bowel syndrome with both constipation and diarrhea:. Insomnia:  Restless leg syndrome: Esophageal stricture/gastroesophageal reflux disease without esophagitis/paraesophageal hernia. She had a swallow study done yesterday; she should be able to have food and to take her medications whole.  She does get out of bed daily.  There are no reports of uncontrolled pain.   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 Abnl nuc 11/2014. Cath 12/2014 - turned down for CABG. Ultimately s/p TTVP, rotational atherectomy, PTCA and stenting of the ostial LCx and left main into the LAD (crush technique), and IVUS of the LAD/Left main. // b. Myoview 11/17: EF 48, poor quality/significant artifact; inf-lateral, inferior ischemia; Intermediate Risk   Chronic atrial fibrillation ([place])    a.  First noted post-op 9/15 spinal fusion.  She had cardioversion, not on anticoagulation. Fall risk, unsteady. // failed DCCV // Holter 10/17: AFib, Avg HR 97, PVCs, no other arrhythmia   Chronic back pain greater than 3 months duration    a. spinal stenosis.  Spinal fusion with rods in 2/15 at [facility] spinal fusion 9/15.   Chronic diastolic CHF    Echo 5/40:  EF 50-55%, trivial AI, midl MR, mod LAE, PASP 37 mmHg // Echocardiogram 03/2020: EF 45-50, no RWMA, mild LVH, mild reduced RVSF, mildly elevated PASP (RVSP 38.3), mild LAE, mild MR, mild to mod TR, trivial AI // Echo 9/21: EF 45, mild LVH, mildly reduced RV SF, moderate LAE, mild RAE, mild MR, mild AI      Circadian rhythm sleep disorder    CTS (carpal tunnel syndrome)    Deficiency anemia 11/10/2014   Depression    Diarrhea  Diverticulitis of colon    Esophageal stricture    Gastritis    Gastroesophageal hernia 07/06/2013   GERD (gastroesophageal reflux disease)    Hiatal hernia    History of blood transfusion    "most of them related to OR's"    HTN (hypertension)    Hyperlipidemia    IBS (irritable bowel syndrome)    Incontinence 10/13/2012   Insomnia    Ischemic chest pain ([place])    Memory disorder 71/05/2693   Metabolic syndrome 85/46/2703   MGUS (monoclonal gammopathy of unknown significance) dx'd 11/2014   a. Neg BMB 11/2014.   Multiple falls     Obesity    Obstructive sleep apnea    "have mask; don't wear it" (01/07/2015)   Orthostasis    PAT (paroxysmal atrial tachycardia)    Personal history of colonic polyps 10/25/2011 & 12/02/11   not retrieved Dr Lyla Son & tubular adenomas   Pneumonia 03/2014   Sinus bradycardia    a. Baseline HR 50s-60s.   Stroke [facility]) early 2000's   "small"; denies residual on 01/07/2015)    Past Surgical History:  Procedure Laterality Date   BACK SURGERY     BONE MARROW BIOPSY  11/2014   CARDIAC CATHETERIZATION  12/27/2014   Procedure: INTRAVASCULAR PRESSURE WIRE/FFR STUDY;  Surgeon: Larey Dresser, MD;  Location: [facility] CATH LAB;  Service: Cardiovascular;;   CARDIAC CATHETERIZATION N/A 11/25/2016   Procedure: Left Heart Cath and Coronary Angiography;  Surgeon: Sherren Mocha, MD;  Location: [place] CV LAB;  Service: Cardiovascular;  Laterality: N/A;   CARDIOVERSION N/A 02/13/2016   Procedure: CARDIOVERSION;  Surgeon: Josue Hector, MD;  Location: [place];  Service: Cardiovascular;  Laterality: N/A;   CARPAL TUNNEL RELEASE Right 1980's   CATARACT EXTRACTION Bilateral    CATARACT EXTRACTION W/ INTRAOCULAR LENS  IMPLANT, BILATERAL  2000's   CORONARY ANGIOPLASTY WITH STENT PLACEMENT  01/07/2015   "2"   CORONARY STENT PLACEMENT     DILATION AND CURETTAGE OF UTERUS     ESOPHAGOGASTRODUODENOSCOPY (EGD) WITH ESOPHAGEAL DILATION  "several times"   IR [place] W/FLUORO  09/08/2022   JOINT REPLACEMENT     KNEE ARTHROSCOPY Left 1995   LAPAROSCOPIC CHOLECYSTECTOMY  2003   LEFT HEART CATHETERIZATION WITH CORONARY ANGIOGRAM N/A 12/27/2014   Procedure: LEFT HEART CATHETERIZATION WITH CORONARY ANGIOGRAM;  Surgeon: Larey Dresser, MD;  Location: [facility] CATH LAB;  Service: Cardiovascular;  Laterality: N/A;   PERCUTANEOUS CORONARY ROTOBLATOR INTERVENTION (PCI-R)  01/07/2015   PERCUTANEOUS CORONARY ROTOBLATOR INTERVENTION (PCI-R) N/A 01/07/2015   Procedure: PERCUTANEOUS CORONARY ROTOBLATOR  INTERVENTION (PCI-R);  Surgeon: Blane Ohara, MD;  Location: [facility] CATH LAB;  Service: Cardiovascular;  Laterality: N/A;   POSTERIOR FUSION THORACIC SPINE  08/2015   POSTERIOR LUMBAR FUSION  01/2015   TOTAL KNEE ARTHROPLASTY Bilateral 1990's - 2000's    Social History   Socioeconomic History   Marital status: Widowed    Spouse name: Not on file   Number of children: 2   Years of education: HS   Highest education level: Not on file  Occupational History   Occupation: Licensed conveyancer- retired    Comment: retired   Occupation: retired  Tobacco Use   Smoking status: Never   Smokeless tobacco: Never  Vaping Use   Vaping Use: Never used  Substance and Sexual Activity   Alcohol use: No    Comment: 01/07/2015 "glass of wine at Christmas, maybe"   Drug use: No   Sexual activity: Not  Currently  Other Topics Concern   Not on file  Social History Narrative   Patient is right handed   Long term resident of [facility]    Social Determinants of Health   Financial Resource Strain: Not on file  Food Insecurity: Not on file  Transportation Needs: Not on file  Physical Activity: Inactive (04/04/2020)   Exercise Vital Sign    Days of Exercise per Week: 0 days    Minutes of Exercise per Session: 0 min  Stress: Stress Concern Present (04/04/2020)   [place]    Feeling of Stress : To some extent  Social Connections: Socially Isolated (04/04/2020)   Social Connection and Isolation Panel [NHANES]    Frequency of Communication with Friends and Family: Three times a week    Frequency of Social Gatherings with Friends and Family: Once a week    Attends Religious Services: Never    Marine scientist or Organizations: No    Attends Archivist Meetings: Never    Marital Status: Widowed  Human resources officer Violence: Not on file   Family History  Problem Relation Age of Onset   Coronary artery disease Father    Peripheral  vascular disease Father    Coronary artery disease Mother    Coronary artery disease Brother    Colon cancer Sister    Emphysema Sister    Sleep apnea Son    Colon cancer Sister        spread to her brain   Emphysema Brother    Dementia Neg Hx       VITAL SIGNS BP (!) 130/59   Pulse 81   Temp (!) 97.1 F (36.2 C)   Resp 20   Ht $R'5\' 6"'qa$  (1.676 m)   Wt 170 lb 6.4 oz (77.3 kg)   SpO2 96%   BMI 27.50 kg/m   Outpatient Encounter Medications as of 09/22/2022  Medication Sig   acetaminophen (TYLENOL) 160 MG/5ML liquid Take 20 mLs by mouth every 8 (eight) hours as needed for fever.   Amino Acids-Protein Hydrolys (PRO-STAT PO) Take 30 mLs by mouth 3 (three) times daily. In tube   aspirin EC 81 MG tablet Take 81 mg by mouth daily. Swallow whole.   Balsam [place]-Castor Oil (VENELEX) OINT Apply 1 application. topically daily.   busPIRone (BUSPAR) 7.5 MG tablet Take 7.5 mg by mouth 3 (three) times daily.   Cholecalciferol 25 MCG (1000 UT) tablet Place 1,000 Units into feeding tube daily. 9 am   clobetasol (TEMOVATE) 0.05 % external solution Apply 1 application. topically 2 (two) times a week. Monday and Friday   diclofenac Sodium (VOLTAREN) 1 % GEL Apply 2 g topically 3 (three) times daily. to neck and shoulders for pain management   diltiazem (CARDIZEM) 120 MG tablet Take 120 mg by mouth every 8 (eight) hours.   ELIQUIS 5 MG TABS tablet Take 1 tablet (5 mg total) by mouth 2 (two) times daily.   eszopiclone 3 MG TABS Take 1 tablet (3 mg total) by mouth at bedtime. Take immediately before bedtime   fentaNYL (DURAGESIC) 50 MCG/HR Place 1 patch onto the skin every 3 (three) days.   FLUoxetine (PROZAC) 20 MG/5ML solution Place 60 mg into feeding tube daily.   furosemide (LASIX) 40 MG tablet Place 40 mg into feeding tube 2 (two) times daily. 9 am   methocarbamol (ROBAXIN) 500 MG tablet Take 500 mg by mouth every 6 (six) hours as needed  for muscle spasms.   naloxone (NARCAN) nasal spray 4 mg/0.1  mL Special Instructions: As needed for depressed respirations less than 8/minute. Notify provider if given. If no effectiveness or improvement, contact 911. As Needed   nitroGLYCERIN (NITROSTAT) 0.4 MG SL tablet Place 1 tablet (0.4 mg total) under the tongue every 5 (five) minutes as needed for chest pain (up to 3 doses).   Omeprazole-Sodium Bicarbonate (KONVOMEP) 2-84 MG/ML SUSR Give 10 mLs by tube in the morning and at bedtime.   potassium chloride 20 MEQ/15ML (10%) SOLN Take 20 mEq by mouth daily. For potassium supplement   rOPINIRole (REQUIP) 0.5 MG tablet Place 0.5 mg into feeding tube in the morning and at bedtime.   rosuvastatin (CRESTOR) 10 MG tablet Place 10 mg into feeding tube daily. 9 am   saccharomyces boulardii (FLORASTOR) 250 MG capsule Take 250 mg by mouth 2 (two) times daily.   [DISCONTINUED] albuterol (VENTOLIN HFA) 108 (90 Base) MCG/ACT inhaler Inhale 2 puffs into the lungs every 6 (six) hours as needed for wheezing or shortness of breath.   [DISCONTINUED] ipratropium-albuterol (DUONEB) 0.5-2.5 (3) MG/3ML SOLN Take 3 mLs by nebulization every 6 (six) hours as needed.   [DISCONTINUED] LINZESS 145 MCG CAPS capsule Place 145 mcg into feeding tube daily as needed (constipation).   [DISCONTINUED] meclizine (ANTIVERT) 25 MG tablet Place 25 mg into feeding tube 3 (three) times daily as needed for dizziness.   [DISCONTINUED] morphine (ROXANOL) 20 MG/ML concentrated solution Take 5 mg by mouth every 2 (two) hours as needed for severe pain.   No facility-administered encounter medications on file as of 09/22/2022.     SIGNIFICANT DIAGNOSTIC EXAMS  PREVIOUS   02-23-22: TEE:  The left ventricular size is normal.  Mild left ventricular hypertrophy  LV ejection fraction = 45-50%.  Left ventricular systolic function is mildly reduced.  The right ventricle is normal in size and function.  The left atrium is moderately to severely dilated.  The right atrium is mildly to moderately  dilated.  Estimated right ventricular systolic pressure is 27 mmHg.  IVC size was normal.  There is no significant valvular stenosis or regurgitation.  There is no pericardial effusion.  There is a pleural effusion present.  Recommend a limited TTE when HR is controlled to reassess   05-23-22: chest x-ray: mild congestive heart failure  07-14-22: chest x-ray:  Changes of bibasilar atelectasis  Nonspecific perihilar inflammatory changes Bilateral spinal fusion hardware   07-20-22: chest x-ray:  Nonspecific perihilar changes Emphysema No improvement compared to 07-14-22.   07-21-22: chest x-ray:  No acute pulmonary process.   TODAY  09-21-22: swallow study:   Recommend regular textures vs mechanical soft per treating SLP at SNF and thin liquids via cup/straw, alternate solids and liquids, implement effortful/hard swallow with solids, and Pt to sit upright for all eating and drinking and 30+ minutes after (avoid bending over as well). Pt will likely need to proceed with solid foods cautiously to see if she experiences and regurgitation. Recommend f/u with SLP services at SNF. Ok for PO medication whole with water.   LABS REVIEWED: PREVIOUS   02-27-22: glucose 102; bun 13; creat 0.52; k+ 4.8; na++ 134; ca 8.9; GFR>90 04-08-22: wbc 7.2 hgb 7.2; hct 11.9; mcv 35.2 plt 185; glucose 114; bun 11; creat 0.37; k+ 4.5; na++ 132; ca 8.6; GFR >90 05-21-22: glucose 113; bun 19; creat 0.5; k+ 4.2; na++ 135; ca 8.5 gfr>60; protein 5.2; albumin 2.4  05-23-22: wbc 5.4; hgb 9.2; hct 29.6  plt 184; glucose 123; bun 21; creat 0.54; k+ 3.8; na++ 8.2; ca 8.2 gfr >60 07-01-22: glucose 91; bun 16; creat 0.44; k+ 4.1; na++ 135; ca 8.6; gfr>60 07-14-22: wbc 9.9; hgb 10.8; hct 35.3; mcv 83.8 plt 283; glucose 168; bun 24; creat 0.52; k+ 4.2; na++ 133; ca 8.6 gfr >60 urine culture:  blood culture: klebsiella pneumoniae  07-15-22: wbc 7.1; hgb 9.4; hct 31.2; mcv 85.2 plt 246; glucose 134; bun 28; creat 0.52; k+ 4.6; na++ 135; ca  8.2; gfr >60; protein 6.2 albumin 2.5; ast 50 alt 85 alk phos 283  07-19-22: blood culture: no growth  07-20-22: wbc 6.2; hgb 9.9; hct 33.2; mcv 84.9 plt 270; glucose 121; bun 24; creat 0.56; k+ 3.6; na++ 135; ca 8.5 gfr >60; protein 6.3 albumin 2.4; ast 31; alt 65; alk phos 286 07-21-22: c-dff +  TODAY  07-23-22: wbc 10.6; hgb 35.1; hct 35.1; mcv 84.2 plt 244; glucose 113; bun 27; creat 0.57; k+ 4.6; na++ 135; ca 9.0; gfr >60; ast 44 alt 58; alk phos 258; protein 6.6; albumin 2.6    Review of Systems  Constitutional:  Negative for malaise/fatigue.  Respiratory:  Negative for cough and shortness of breath.   Cardiovascular:  Negative for chest pain, palpitations and leg swelling.  Gastrointestinal:  Negative for abdominal pain, constipation and heartburn.  Musculoskeletal:  Negative for back pain, joint pain and myalgias.  Skin: Negative.   Neurological:  Negative for dizziness.  Psychiatric/Behavioral:  The patient is not nervous/anxious.    Physical Exam Constitutional:      General: She is not in acute distress.    Appearance: She is well-developed. She is not diaphoretic.  Neck:     Thyroid: No thyromegaly.  Cardiovascular:     Rate and Rhythm: Normal rate and regular rhythm.     Pulses: Normal pulses.     Heart sounds: Normal heart sounds.  Pulmonary:     Effort: Pulmonary effort is normal. No respiratory distress.     Breath sounds: Normal breath sounds.  Abdominal:     General: Bowel sounds are normal. There is no distension.     Palpations: Abdomen is soft.     Tenderness: There is no abdominal tenderness.     Comments: Peg tube present   Musculoskeletal:        General: Normal range of motion.     Cervical back: Neck supple.     Right lower leg: No edema.     Left lower leg: No edema.  Lymphadenopathy:     Cervical: No cervical adenopathy.  Skin:    General: Skin is warm and dry.  Neurological:     Mental Status: She is alert and oriented to person, place, and time.   Psychiatric:        Mood and Affect: Mood normal.      ASSESSMENT/ PLAN:  TODAY  Irritable bowel syndrome with both constipation and diarrhea: will monitor   2. Insomnia: will continue lunesta 3 mg nightly   3. Restless leg syndrome: will continue requip 0.5 mg twice daily   4. Esophageal stricture/gastroesophageal reflux disease without esophagitis/paraesophageal hernia; has peg tube present. Will continue konvopem 2-84 mg/ml 10 cc twice daily    PREVIOUS   5. Chronic diastolic CHF (chronic congestive heart failure) EF40-45%; will continue lasix 40 mg twice daily   6. Permanent atrial fibrillation: heart rate is stable will continue diltiazem 120 mg every 8 hours  for rate control eliquis 5 mg twice daily  7. Coronary artery disease involving native artery of native heart with unstable angina: will continue asa 81 mg daily has prn ntg.   8. Major depression recurrent chronic: is stable will continue prozac  60 mg daily and buspar 7.5 mg three times  daily for anxiety   9. Protein calorie malnutrition: protein 6.6. albumin 2.6 will continue prostat 30 mL three times daily   10. Chronic pain associated with significant psychosocial dysfunction: will continue duragesic 50 mcg patch every 3 days; is off vicodin. Has robaxin 500 mg every 6 hours as needed   11. Iron deficiency anemia associated with chronic blood loss: hgb 10.6 will monitor  12. Aortic atherosclerosis: (ct 03-17-22) is on statin and asa   13. Hyperlipidemia LDL goal <100: will continue crestor 10 mg daily       Ok Edwards NP [facility]   call [phone]

## 2022-09-23 ENCOUNTER — Other Ambulatory Visit: Payer: Self-pay | Admitting: Adult Health

## 2022-09-23 MED ORDER — FENTANYL 50 MCG/HR TD PT72: 1.0000 | MEDICATED_PATCH | TRANSDERMAL | 0 refills | Status: DC

## 2022-09-23 MED ORDER — ESZOPICLONE 3 MG PO TABS
3.0000 mg | ORAL_TABLET | Freq: Every day | ORAL | 0 refills | Status: DC
Start: 2022-09-23 — End: 2022-10-20

## 2022-10-15 ENCOUNTER — Encounter: Payer: Self-pay | Admitting: Hematology

## 2022-10-16 ENCOUNTER — Encounter: Payer: Self-pay | Admitting: Adult Health

## 2022-10-16 ENCOUNTER — Non-Acute Institutional Stay (SKILLED_NURSING_FACILITY): Payer: Medicare HMO | Admitting: Adult Health

## 2022-10-16 DIAGNOSIS — I700 Atherosclerosis of aorta: Secondary | ICD-10-CM | POA: Diagnosis not present

## 2022-10-16 DIAGNOSIS — F339 Major depressive disorder, recurrent, unspecified: Secondary | ICD-10-CM

## 2022-10-16 DIAGNOSIS — I5032 Chronic diastolic (congestive) heart failure: Secondary | ICD-10-CM | POA: Diagnosis not present

## 2022-10-16 NOTE — Progress Notes (Signed)
Location:  [place] Room Number: 967 Place of Service:  SNF (31) Provider:  Ok Edwards, NP   CODE STATUS: DNR  Allergies  Allergen Reactions   Buprenorphine Hcl Itching   Cymbalta [Duloxetine Hcl] Other (See Comments)    Other reaction(s): Other (See Comments) Personality changes - crying  2014   Morphine And Related Itching   Oxycodone-Acetaminophen Other (See Comments)    Personality changes    Valium [Diazepam] Other (See Comments)    Personality changes - "in another world" ;  Hallucinations  2015   Statins Other (See Comments)    Other reaction(s): Other (See Comments) Muscle weakness Muscle weakness   Morphine Itching   Altace [Ramipril] Other (See Comments)    unknown   Floxin [Ofloxacin] Other (See Comments)    unknown   Hydromorphone Other (See Comments)    Other reaction(s): Confusion (intolerance) unknown   Lovaza [Omega-3-Acid Ethyl Esters] Other (See Comments)    Muscle weakness    Trilipix [Choline Fenofibrate] Other (See Comments)    Muscle weakness    Zetia [Ezetimibe] Other (See Comments)    Muscle weakness     Chief Complaint  Patient presents with   Acute Visit    Care plan meeting    HPI:  We have come together for her care plan meeting. Family present  BIMS 15/15 mood 4/30: some depression; nervous. She is ambulatory 150 feet without falls present. She is occasionally incontinent bladder and frequently incontinent of bowel. She is independent with brp; hygiene dressing. Moderate assist with bathing supervision with shower.  Dietary: set up with eating; weight 167 pounds regular diet weighing twice week. Appetite 50-100%. She is maintaining her weight in the upper 160's. Family is concerned that her heart is not strong enough to tolerate any surgical procedure; such as removing a peg tube or reinsertion of peg tube.   Therapy: none at this time. Activities I pad. She continues to be followed for her chronic illnesses  including:  Aortic atherosclerosis  Chronic diastolic CHF (congestive heart failure)   Major depression recurrent; chronic.  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 Abnl nuc 11/2014. Cath 12/2014 - turned down for CABG. Ultimately s/p TTVP, rotational atherectomy, PTCA and stenting of the ostial LCx and left main into the LAD (crush technique), and IVUS of the LAD/Left main. // b. Myoview 11/17: EF 48, poor quality/significant artifact; inf-lateral, inferior ischemia; Intermediate Risk   Chronic atrial fibrillation ([place])    a.  First noted post-op 9/15 spinal fusion.  She had cardioversion, not on anticoagulation. Fall risk, unsteady. // failed DCCV // Holter 10/17: AFib, Avg HR 97, PVCs, no other arrhythmia   Chronic back pain greater than 3 months duration    a. spinal stenosis.  Spinal fusion with rods in 2/15 at [facility] spinal fusion 9/15.   Chronic diastolic CHF    Echo 8/93:  EF 50-55%, trivial AI, midl MR, mod LAE, PASP 37 mmHg // Echocardiogram 03/2020: EF 45-50, no RWMA, mild LVH, mild reduced RVSF, mildly elevated PASP (RVSP 38.3), mild LAE, mild MR, mild to mod TR, trivial AI // Echo 9/21: EF 45, mild LVH, mildly reduced RV SF, moderate LAE, mild RAE, mild MR, mild AI  Circadian rhythm sleep disorder    CTS (carpal tunnel syndrome)    Deficiency anemia 11/10/2014   Depression    Diarrhea    Diverticulitis of colon    Esophageal stricture    Gastritis    Gastroesophageal hernia 07/06/2013   GERD (gastroesophageal reflux disease)    Hiatal hernia    History of blood transfusion    "most of them related to OR's"    HTN (hypertension)    Hyperlipidemia    IBS (irritable bowel syndrome)    Incontinence 10/13/2012   Insomnia    Ischemic chest  pain ([place])    Memory disorder 66/59/9357   Metabolic syndrome 01/77/9390   MGUS (monoclonal gammopathy of unknown significance) dx'd 11/2014   a. Neg BMB 11/2014.   Multiple falls    Obesity    Obstructive sleep apnea    "have mask; don't wear it" (01/07/2015)   Orthostasis    PAT (paroxysmal atrial tachycardia)    Personal history of colonic polyps 10/25/2011 & 12/02/11   not retrieved Dr Lyla Son & tubular adenomas   Pneumonia 03/2014   Sinus bradycardia    a. Baseline HR 50s-60s.   Stroke [facility]) early 2000's   "small"; denies residual on 01/07/2015)    Past Surgical History:  Procedure Laterality Date   BACK SURGERY     BONE MARROW BIOPSY  11/2014   CARDIAC CATHETERIZATION  12/27/2014   Procedure: INTRAVASCULAR PRESSURE WIRE/FFR STUDY;  Surgeon: Larey Dresser, MD;  Location: [facility] CATH LAB;  Service: Cardiovascular;;   CARDIAC CATHETERIZATION N/A 11/25/2016   Procedure: Left Heart Cath and Coronary Angiography;  Surgeon: Sherren Mocha, MD;  Location: [place] CV LAB;  Service: Cardiovascular;  Laterality: N/A;   CARDIOVERSION N/A 02/13/2016   Procedure: CARDIOVERSION;  Surgeon: Josue Hector, MD;  Location: [place];  Service: Cardiovascular;  Laterality: N/A;   CARPAL TUNNEL RELEASE Right 1980's   CATARACT EXTRACTION Bilateral    CATARACT EXTRACTION W/ INTRAOCULAR LENS  IMPLANT, BILATERAL  2000's   CORONARY ANGIOPLASTY WITH STENT PLACEMENT  01/07/2015   "2"   CORONARY STENT PLACEMENT     DILATION AND CURETTAGE OF UTERUS     ESOPHAGOGASTRODUODENOSCOPY (EGD) WITH ESOPHAGEAL DILATION  "several times"   IR [place] W/FLUORO  09/08/2022   JOINT REPLACEMENT     KNEE ARTHROSCOPY Left 1995   LAPAROSCOPIC CHOLECYSTECTOMY  2003   LEFT HEART CATHETERIZATION WITH CORONARY ANGIOGRAM N/A 12/27/2014   Procedure: LEFT HEART CATHETERIZATION WITH CORONARY ANGIOGRAM;  Surgeon: Larey Dresser, MD;  Location: [facility] CATH LAB;  Service: Cardiovascular;  Laterality: N/A;    PERCUTANEOUS CORONARY ROTOBLATOR INTERVENTION (PCI-R)  01/07/2015   PERCUTANEOUS CORONARY ROTOBLATOR INTERVENTION (PCI-R) N/A 01/07/2015   Procedure: PERCUTANEOUS CORONARY ROTOBLATOR INTERVENTION (PCI-R);  Surgeon: Blane Ohara, MD;  Location: [facility] CATH LAB;  Service: Cardiovascular;  Laterality: N/A;   POSTERIOR FUSION THORACIC SPINE  08/2015   POSTERIOR LUMBAR FUSION  01/2015   TOTAL KNEE ARTHROPLASTY Bilateral 1990's - 2000's    Social History   Socioeconomic History   Marital status: Widowed    Spouse name: Not on file   Number of children: 2   Years of education: HS   Highest education level: Not on file  Occupational History   Occupation: Licensed conveyancer- retired    Comment: retired   Occupation: retired  Tobacco Use   Smoking status: Never   Smokeless tobacco: Never  Vaping Use   Vaping Use: Never used  Substance and Sexual  Activity   Alcohol use: No    Comment: 01/07/2015 "glass of wine at Christmas, maybe"   Drug use: No   Sexual activity: Not Currently  Other Topics Concern   Not on file  Social History Narrative   Patient is right handed   Long term resident of [facility]    Social Determinants of Health   Financial Resource Strain: Not on file  Food Insecurity: Not on file  Transportation Needs: Not on file  Physical Activity: Inactive (04/04/2020)   Exercise Vital Sign    Days of Exercise per Week: 0 days    Minutes of Exercise per Session: 0 min  Stress: Stress Concern Present (04/04/2020)   [place]    Feeling of Stress : To some extent  Social Connections: Socially Isolated (04/04/2020)   Social Connection and Isolation Panel [NHANES]    Frequency of Communication with Friends and Family: Three times a week    Frequency of Social Gatherings with Friends and Family: Once a week    Attends Religious Services: Never    Marine scientist or Organizations: No    Attends Archivist  Meetings: Never    Marital Status: Widowed  Human resources officer Violence: Not on file   Family History  Problem Relation Age of Onset   Coronary artery disease Father    Peripheral vascular disease Father    Coronary artery disease Mother    Coronary artery disease Brother    Colon cancer Sister    Emphysema Sister    Sleep apnea Son    Colon cancer Sister        spread to her brain   Emphysema Brother    Dementia Neg Hx       VITAL SIGNS BP 124/65   Pulse (!) 56   Temp (!) 97.1 F (36.2 C)   Resp 20   Ht _0  (1.676 m)   Wt 167 lb 9.6 oz (76 kg)   SpO2 92%   BMI 27.05 kg/m   Outpatient Encounter Medications as of 10/16/2022  Medication Sig   acetaminophen (TYLENOL) 160 MG/5ML liquid Take 20 mLs by mouth every 8 (eight) hours as needed for fever.   Amino Acids-Protein Hydrolys (PRO-STAT PO) Take 30 mLs by mouth 3 (three) times daily. In tube   aspirin EC 81 MG tablet Take 81 mg by mouth daily. Swallow whole.   Balsam [place]-Castor Oil (VENELEX) OINT Apply 1 application. topically daily.   busPIRone (BUSPAR) 7.5 MG tablet Take 7.5 mg by mouth 3 (three) times daily.   Cholecalciferol 25 MCG (1000 UT) tablet Place 1,000 Units into feeding tube daily. 9 am   clobetasol (TEMOVATE) 0.05 % external solution Apply 1 application. topically 2 (two) times a week. Monday and Friday   diclofenac Sodium (VOLTAREN) 1 % GEL Apply 2 g topically 3 (three) times daily. to neck and shoulders for pain management   diltiazem (CARDIZEM) 120 MG tablet Take 120 mg by mouth every 8 (eight) hours.   ELIQUIS 5 MG TABS tablet Take 1 tablet (5 mg total) by mouth 2 (two) times daily.   eszopiclone 3 MG TABS Take 1 tablet (3 mg total) by mouth at bedtime. Take immediately before bedtime   fentaNYL (DURAGESIC) 50 MCG/HR Place 1 patch onto the skin every 3 (three) days.   FLUoxetine (PROZAC) 20 MG/5ML solution Take 60 mg by mouth daily.   furosemide (LASIX) 40 MG tablet Place 40 mg into  feeding tube 2  (two) times daily. 9 am   methocarbamol (ROBAXIN) 500 MG tablet Take 500 mg by mouth every 6 (six) hours as needed for muscle spasms.   naloxone (NARCAN) nasal spray 4 mg/0.1 mL Special Instructions: As needed for depressed respirations less than 8/minute. Notify provider if given. If no effectiveness or improvement, contact 911. As Needed   nitroGLYCERIN (NITROSTAT) 0.4 MG SL tablet Place 1 tablet (0.4 mg total) under the tongue every 5 (five) minutes as needed for chest pain (up to 3 doses).   Omeprazole-Sodium Bicarbonate (KONVOMEP) 2-84 MG/ML SUSR Give 10 mLs by tube in the morning and at bedtime.   potassium chloride 20 MEQ/15ML (10%) SOLN Take 20 mEq by mouth daily. For potassium supplement   rOPINIRole (REQUIP) 0.5 MG tablet Take 0.5 mg by mouth in the morning and at bedtime.   rosuvastatin (CRESTOR) 10 MG tablet Take 10 mg by mouth daily. 9 am   saccharomyces boulardii (FLORASTOR) 250 MG capsule Take 250 mg by mouth 2 (two) times daily.   No facility-administered encounter medications on file as of 10/16/2022.     SIGNIFICANT DIAGNOSTIC EXAMS  PREVIOUS   02-23-22: TEE:  The left ventricular size is normal.  Mild left ventricular hypertrophy  LV ejection fraction = 45-50%.  Left ventricular systolic function is mildly reduced.  The right ventricle is normal in size and function.  The left atrium is moderately to severely dilated.  The right atrium is mildly to moderately dilated.  Estimated right ventricular systolic pressure is 27 mmHg.  IVC size was normal.  There is no significant valvular stenosis or regurgitation.  There is no pericardial effusion.  There is a pleural effusion present.  Recommend a limited TTE when HR is controlled to reassess   05-23-22: chest x-ray: mild congestive heart failure  07-14-22: chest x-ray:  Changes of bibasilar atelectasis  Nonspecific perihilar inflammatory changes Bilateral spinal fusion hardware   07-20-22: chest x-ray:  Nonspecific  perihilar changes Emphysema No improvement compared to 07-14-22.   07-21-22: chest x-ray:  No acute pulmonary process.   09-21-22: swallow study:   Recommend regular textures vs mechanical soft per treating SLP at SNF and thin liquids via cup/straw, alternate solids and liquids, implement effortful/hard swallow with solids, and Pt to sit upright for all eating and drinking and 30+ minutes after (avoid bending over as well). Pt will likely need to proceed with solid foods cautiously to see if she experiences and regurgitation. Recommend f/u with SLP services at SNF. Ok for PO medication whole with water.   NO NEW EXAMS   LABS REVIEWED: PREVIOUS   02-27-22: glucose 102; bun 13; creat 0.52; k+ 4.8; na++ 134; ca 8.9; GFR>90 04-08-22: wbc 7.2 hgb 7.2; hct 11.9; mcv 35.2 plt 185; glucose 114; bun 11; creat 0.37; k+ 4.5; na++ 132; ca 8.6; GFR >90 05-21-22: glucose 113; bun 19; creat 0.5; k+ 4.2; na++ 135; ca 8.5 gfr>60; protein 5.2; albumin 2.4  05-23-22: wbc 5.4; hgb 9.2; hct 29.6 plt 184; glucose 123; bun 21; creat 0.54; k+ 3.8; na++ 8.2; ca 8.2 gfr >60 07-01-22: glucose 91; bun 16; creat 0.44; k+ 4.1; na++ 135; ca 8.6; gfr>60 07-14-22: wbc 9.9; hgb 10.8; hct 35.3; mcv 83.8 plt 283; glucose 168; bun 24; creat 0.52; k+ 4.2; na++ 133; ca 8.6 gfr >60 urine culture:  blood culture: klebsiella pneumoniae  07-15-22: wbc 7.1; hgb 9.4; hct 31.2; mcv 85.2 plt 246; glucose 134; bun 28; creat 0.52; k+ 4.6; na++ 135; ca  8.2; gfr >60; protein 6.2 albumin 2.5; ast 50 alt 85 alk phos 283  07-19-22: blood culture: no growth  07-20-22: wbc 6.2; hgb 9.9; hct 33.2; mcv 84.9 plt 270; glucose 121; bun 24; creat 0.56; k+ 3.6; na++ 135; ca 8.5 gfr >60; protein 6.3 albumin 2.4; ast 31; alt 65; alk phos 286 07-21-22: c-dff + 07-23-22: wbc 10.6; hgb 35.1; hct 35.1; mcv 84.2 plt 244; glucose 113; bun 27; creat 0.57; k+ 4.6; na++ 135; ca 9.0; gfr >60; ast 44 alt 58; alk phos 258; protein 6.6; albumin 2.6   NO NEW LABS.    Review of Systems   Constitutional:  Negative for malaise/fatigue.  Respiratory:  Negative for cough and shortness of breath.   Cardiovascular:  Negative for chest pain, palpitations and leg swelling.  Gastrointestinal:  Negative for abdominal pain, constipation and heartburn.  Musculoskeletal:  Negative for back pain, joint pain and myalgias.  Skin: Negative.   Neurological:  Negative for dizziness.  Psychiatric/Behavioral:  The patient is not nervous/anxious.     Physical Exam Constitutional:      General: She is not in acute distress.    Appearance: She is well-developed. She is not diaphoretic.  Neck:     Thyroid: No thyromegaly.  Cardiovascular:     Rate and Rhythm: Normal rate and regular rhythm.     Pulses: Normal pulses.     Heart sounds: Normal heart sounds.  Pulmonary:     Effort: Pulmonary effort is normal. No respiratory distress.     Breath sounds: Normal breath sounds.  Abdominal:     General: Bowel sounds are normal. There is no distension.     Palpations: Abdomen is soft.     Tenderness: There is no abdominal tenderness.     Comments: Peg tube present   Musculoskeletal:        General: Normal range of motion.     Cervical back: Neck supple.     Right lower leg: No edema.     Left lower leg: No edema.  Lymphadenopathy:     Cervical: No cervical adenopathy.  Skin:    General: Skin is warm and dry.  Neurological:     Mental Status: She is alert and oriented to person, place, and time.  Psychiatric:        Mood and Affect: Mood normal.      ASSESSMENT/ PLAN:  TODAY  Aortic atherosclerosis Chronic diastolic CHF (congestive heart failure)  Major depression recurrent; chronic  Will continue medications Will continue current plan of care Will setup cardiology appointment for the strength of heart for possible removal of her feeding tube with a future possible replacement.  Will continue to monitor her status.    Time spent with patient 40 minutes: dietary;  medications; overall health status    Ok Edwards NP [facility]  call [phone]

## 2022-10-20 ENCOUNTER — Other Ambulatory Visit: Payer: Self-pay | Admitting: Adult Health

## 2022-10-20 MED ORDER — ESZOPICLONE 3 MG PO TABS: 3.0000 mg | ORAL_TABLET | Freq: Every day | ORAL | 0 refills | Status: DC

## 2022-10-20 MED ORDER — FENTANYL 25 MCG/HR TD PT72: 1.0000 | MEDICATED_PATCH | TRANSDERMAL | 0 refills | Status: DC

## 2022-10-21 ENCOUNTER — Encounter: Payer: Self-pay | Admitting: Adult Health

## 2022-10-25 ENCOUNTER — Encounter: Payer: Self-pay | Admitting: Student

## 2022-10-26 ENCOUNTER — Non-Acute Institutional Stay (SKILLED_NURSING_FACILITY): Payer: Medicare HMO | Admitting: Student

## 2022-10-26 ENCOUNTER — Other Ambulatory Visit: Payer: Self-pay | Admitting: Student

## 2022-10-26 DIAGNOSIS — I4821 Permanent atrial fibrillation: Secondary | ICD-10-CM | POA: Diagnosis not present

## 2022-10-26 DIAGNOSIS — M159 Polyosteoarthritis, unspecified: Secondary | ICD-10-CM

## 2022-10-26 DIAGNOSIS — I5032 Chronic diastolic (congestive) heart failure: Secondary | ICD-10-CM | POA: Diagnosis not present

## 2022-10-26 DIAGNOSIS — K9429 Other complications of gastrostomy: Secondary | ICD-10-CM | POA: Diagnosis not present

## 2022-10-26 NOTE — Assessment & Plan Note (Addendum)
Improved with addition of Nystatin powder. Recommend continuing PRN Nystatin. Advised patient that she does not necessarily need to dress the area, but she prefers this as otherwise some discharge may leak around tube and onto her clothes. Site is otherwise clean and dry today. Hopeful she will be able to have tube removed pending cardiac clearance by cardiology next month.

## 2022-10-26 NOTE — Assessment & Plan Note (Signed)
LE edema stable from baseline. Has a cardiology appointment on 12/1. Likely could benefit from a repeat echo given mid-range EF on most recent study. Could consider addition of an SGLT2i today, but patient already dealing with incontinence and increased urination is likely an undesirable side effect. Would defer medication adjustments to cardiology at this time.  - Continue current management

## 2022-10-26 NOTE — Assessment & Plan Note (Signed)
Rate controlled.  - Continue Cardizem '120mg'$  q8h and Eliquis

## 2022-10-26 NOTE — Assessment & Plan Note (Signed)
DG Hip yesterday with no fracture or dislocation. Suspect pain is related to OA. Already on fentanyl patch and Tylenol. Would favor avoiding further sedating meds. Will instead order directed PT for the hip and can expand Voltaren use to include hip in  addition to neck/shoulders.

## 2022-10-26 NOTE — Progress Notes (Signed)
Opened in error

## 2022-10-26 NOTE — Progress Notes (Signed)
Location:  [facility] Place of Service:   Patient Room SNF [phone]) Provider: Pearla Dubonnet, MD    CODE STATUS: DNR  Allergies  Allergen Reactions   Buprenorphine Hcl Itching   Cymbalta [Duloxetine Hcl] Other (See Comments)    Other reaction(s): Other (See Comments) Personality changes - crying  2014   Morphine And Related Itching   Oxycodone-Acetaminophen Other (See Comments)    Personality changes    Valium [Diazepam] Other (See Comments)    Personality changes - "in another world" ;  Hallucinations  2015   Statins Other (See Comments)    Other reaction(s): Other (See Comments) Muscle weakness Muscle weakness   Morphine Itching   Altace [Ramipril] Other (See Comments)    unknown   Floxin [Ofloxacin] Other (See Comments)    unknown   Hydromorphone Other (See Comments)    Other reaction(s): Confusion (intolerance) unknown   Lovaza [Omega-3-Acid Ethyl Esters] Other (See Comments)    Muscle weakness    Trilipix [Choline Fenofibrate] Other (See Comments)    Muscle weakness    Zetia [Ezetimibe] Other (See Comments)    Muscle weakness     No chief complaint on file.   HPI:  Patient with a handful of complaints today: 1) Right sided hip pain x59mo had X-Ray yesterday without bony abnormality. Has been working to decrease her pain regimen, is now off of Vicodin. Just using a fentanyl patch and tylenol at this time. Also has been using voltaren gel on Neck/Shoulders but not hip. Has done some physical therapy in the past but none directed toward the hip. Speaks very highly of her physical therapist CEinar Pheasant   Also complaining of some skin breakdown under her breasts near her G-tube site. She is not currently using the G-tube and reports that it sometimes gets "messy" and "gross". She has therefore been keeping a gauze dressing in place over it with paper tape. She feels that her body reacts poorly to the paper tape and has noticed some skin  breakdown in the area. This was previously being treated with Venelex ointment, but she has found she gets better results with the Nystatin powder her nurse has been applying recently. Of note, has a cardiology appt Dec 1 for clearance for surgery to remove G-tube.   Complaining about LE edema, but it is no worse than her baseline. No palpitations ,CP or SOB.   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 Abnl nuc 11/2014. Cath 12/2014 - turned down for CABG. Ultimately s/p TTVP, rotational atherectomy, PTCA and stenting of the ostial LCx and left main into the LAD (crush technique), and IVUS of the LAD/Left main. // b. Myoview 11/17: EF 48, poor quality/significant artifact; inf-lateral, inferior ischemia; Intermediate Risk   [place]    a.  First noted post-op 9/15 spinal fusion.  She had cardioversion, not on anticoagulation. Fall risk, unsteady. // failed DCCV // Holter 10/17: AFib, Avg HR 97, PVCs, no other arrhythmia   Chronic back pain greater than 3 months duration    a. spinal stenosis.  [facility]   Chronic diastolic CHF    Echo 20/99  EF  50-55%, trivial AI, midl MR, mod LAE, PASP 37 mmHg // Echocardiogram 03/2020: EF 45-50, no RWMA, mild LVH, mild reduced RVSF, mildly elevated PASP (RVSP 38.3), mild LAE, mild MR, mild to mod TR, trivial AI // Echo 9/21: EF 45, mild LVH, mildly reduced RV SF, moderate LAE, mild RAE, mild MR, mild AI      Circadian rhythm sleep disorder    CTS (carpal tunnel syndrome)    Deficiency anemia 11/10/2014   Depression    Diarrhea    Diverticulitis of colon    Esophageal stricture    Gastritis    Gastroesophageal hernia 07/06/2013   GERD (gastroesophageal  reflux disease)    Hiatal hernia    History of blood transfusion    "most of them related to OR's"    HTN (hypertension)    Hyperlipidemia    IBS (irritable bowel syndrome)    Incontinence 10/13/2012   Insomnia    Ischemic chest pain ([place])    Memory disorder 15/83/0940   Metabolic syndrome 76/80/8811   MGUS (monoclonal gammopathy of unknown significance) dx'd 11/2014   a. Neg BMB 11/2014.   Multiple falls    Obesity    Obstructive sleep apnea    "have mask; don't wear it" (01/07/2015)   Orthostasis    PAT (paroxysmal atrial tachycardia)    Personal history of colonic polyps 10/25/2011 & 12/02/11   not retrieved Dr Lyla Son & tubular adenomas   Pneumonia 03/2014   Sinus bradycardia    a. Baseline HR 50s-60s.   Stroke [facility]) early 2000's   "small"; denies residual on 01/07/2015)    Past Surgical History:  Procedure Laterality Date   BACK SURGERY     BONE MARROW BIOPSY  11/2014   CARDIAC CATHETERIZATION  12/27/2014   Procedure: INTRAVASCULAR PRESSURE WIRE/FFR STUDY;  Surgeon: Larey Dresser, MD;  Location: [facility] CATH LAB;  Service: Cardiovascular;;   CARDIAC CATHETERIZATION N/A 11/25/2016   Procedure: Left Heart Cath and Coronary Angiography;  Surgeon: Sherren Mocha, MD;  Location: [place] CV LAB;  Service: Cardiovascular;  Laterality: N/A;   CARDIOVERSION N/A 02/13/2016   Procedure: CARDIOVERSION;  Surgeon: Josue Hector, MD;  Location: [place];  Service: Cardiovascular;  Laterality: N/A;   CARPAL TUNNEL RELEASE Right 1980's   CATARACT EXTRACTION Bilateral    CATARACT EXTRACTION W/ INTRAOCULAR LENS  IMPLANT, BILATERAL  2000's   CORONARY ANGIOPLASTY WITH STENT PLACEMENT  01/07/2015   "2"   CORONARY STENT PLACEMENT     DILATION AND CURETTAGE OF UTERUS     ESOPHAGOGASTRODUODENOSCOPY (EGD) WITH ESOPHAGEAL DILATION  "several times"   IR Boyd W/FLUORO  09/08/2022   JOINT REPLACEMENT     KNEE ARTHROSCOPY Left 1995   LAPAROSCOPIC CHOLECYSTECTOMY   2003   LEFT HEART CATHETERIZATION WITH CORONARY ANGIOGRAM N/A 12/27/2014   Procedure: LEFT HEART CATHETERIZATION WITH CORONARY ANGIOGRAM;  Surgeon: Larey Dresser, MD;  Location: [facility] CATH LAB;  Service: Cardiovascular;  Laterality: N/A;   PERCUTANEOUS CORONARY ROTOBLATOR INTERVENTION (PCI-R)  01/07/2015   PERCUTANEOUS CORONARY ROTOBLATOR INTERVENTION (PCI-R) N/A 01/07/2015   Procedure: PERCUTANEOUS CORONARY ROTOBLATOR INTERVENTION (PCI-R);  Surgeon: Blane Ohara, MD;  Location: [facility] CATH LAB;  Service: Cardiovascular;  Laterality: N/A;   POSTERIOR FUSION THORACIC SPINE  08/2015   POSTERIOR LUMBAR FUSION  01/2015   TOTAL KNEE ARTHROPLASTY Bilateral 1990's - 2000's    Social History   Socioeconomic History   Marital status: Widowed    Spouse name: Not on file  Number of children: 2   Years of education: HS   Highest education level: Not on file  Occupational History   Occupation: Licensed conveyancer- retired    Comment: retired   Occupation: retired  Tobacco Use   Smoking status: Never   Smokeless tobacco: Never  Vaping Use   Vaping Use: Never used  Substance and Sexual Activity   Alcohol use: No    Comment: 01/07/2015 "glass of wine at Christmas, maybe"   Drug use: No   Sexual activity: Not Currently  Other Topics Concern   Not on file  Social History Narrative   Patient is right handed   [facility] resident of [facility]    Social Determinants of Health   Financial Resource Strain: Not on file  Food Insecurity: Not on file  Transportation Needs: Not on file  Physical Activity: Inactive (04/04/2020)   Exercise Vital Sign    Days of Exercise per Week: 0 days    Minutes of Exercise per Session: 0 min  Stress: Stress Concern Present (04/04/2020)   [place]    Feeling of Stress : To some extent  Social Connections: Socially Isolated (04/04/2020)   Social Connection and Isolation Panel [NHANES]    Frequency of Communication  with Friends and Family: Three times a week    Frequency of Social Gatherings with Friends and Family: Once a week    Attends Religious Services: Never    Marine scientist or Organizations: No    Attends Archivist Meetings: Never    Marital Status: Widowed  Human resources officer Violence: Not on file   Family History  Problem Relation Age of Onset   Coronary artery disease Father    Peripheral vascular disease Father    Coronary artery disease Mother    Coronary artery disease Brother    Colon cancer Sister    Emphysema Sister    Sleep apnea Son    Colon cancer Sister        spread to her brain   Emphysema Brother    Dementia Neg Hx       VITAL SIGNS Vitals:   10/26/22 1244  BP: 117/65  Pulse: 92  Resp: (!) 21  Temp: (!) 97.4 F (36.3 C)  SpO2: 94%   Physical Exam Vitals reviewed.  Constitutional:      General: She is not in acute distress.    Appearance: She is not ill-appearing.     Comments: Sitting up in bed, engaged in interview and exam  Cardiovascular:     Rate and Rhythm: Normal rate. Rhythm irregular.     Heart sounds: No murmur heard. Pulmonary:     Effort: Pulmonary effort is normal. No respiratory distress.     Breath sounds: Normal breath sounds. No wheezing or rales.  Abdominal:     Comments: G-tube in place. Site clean and dry with bandage in place. Small area of healing skin breakdown under L breast near the bandage site.   Musculoskeletal:     Comments: 1+ pitting edema to mid-shin bilaterally, 2+ DP and PT pulses bilaterally  Skin:    General: Skin is warm and dry.     Capillary Refill: Capillary refill takes less than 2 seconds.  Neurological:     General: No focal deficit present.     Mental Status: She is alert and oriented to person, place, and time.     Cranial Nerves: No dysarthria.  Outpatient Encounter Medications as of 10/26/2022  Medication Sig   acetaminophen (TYLENOL) 160 MG/5ML liquid Take 20 mLs by mouth  every 8 (eight) hours as needed for fever.   Amino Acids-Protein Hydrolys (PRO-STAT PO) Take 30 mLs by mouth 3 (three) times daily. In tube   aspirin EC 81 MG tablet Take 81 mg by mouth daily. Swallow whole.   Balsam [place]-Castor Oil (VENELEX) OINT Apply 1 application. topically daily.   busPIRone (BUSPAR) 7.5 MG tablet Take 7.5 mg by mouth 3 (three) times daily.   Cholecalciferol 25 MCG (1000 UT) tablet Place 1,000 Units into feeding tube daily. 9 am   clobetasol (TEMOVATE) 0.05 % external solution Apply 1 application. topically 2 (two) times a week. Monday and Friday   diclofenac Sodium (VOLTAREN) 1 % GEL Apply 2 g topically 3 (three) times daily. to neck and shoulders for pain management   diltiazem (CARDIZEM) 120 MG tablet Take 120 mg by mouth every 8 (eight) hours.   ELIQUIS 5 MG TABS tablet Take 1 tablet (5 mg total) by mouth 2 (two) times daily.   eszopiclone 3 MG TABS Take 1 tablet (3 mg total) by mouth at bedtime. Take immediately before bedtime   fentaNYL (DURAGESIC) 25 MCG/HR Place 1 patch onto the skin every 3 (three) days.   FLUoxetine (PROZAC) 20 MG/5ML solution Take 60 mg by mouth daily.   furosemide (LASIX) 40 MG tablet Place 40 mg into feeding tube 2 (two) times daily. 9 am   methocarbamol (ROBAXIN) 500 MG tablet Take 500 mg by mouth every 6 (six) hours as needed for muscle spasms.   naloxone (NARCAN) nasal spray 4 mg/0.1 mL Special Instructions: As needed for depressed respirations less than 8/minute. Notify provider if given. If no effectiveness or improvement, contact 911. As Needed   nitroGLYCERIN (NITROSTAT) 0.4 MG SL tablet Place 1 tablet (0.4 mg total) under the tongue every 5 (five) minutes as needed for chest pain (up to 3 doses).   Omeprazole-Sodium Bicarbonate (KONVOMEP) 2-84 MG/ML SUSR Give 10 mLs by tube in the morning and at bedtime.   potassium chloride 20 MEQ/15ML (10%) SOLN Take 20 mEq by mouth daily. For potassium supplement   rOPINIRole (REQUIP) 0.5 MG tablet  Take 0.5 mg by mouth in the morning and at bedtime.   rosuvastatin (CRESTOR) 10 MG tablet Take 10 mg by mouth daily. 9 am   saccharomyces boulardii (FLORASTOR) 250 MG capsule Take 250 mg by mouth 2 (two) times daily.   No facility-administered encounter medications on file as of 10/26/2022.     ASSESSMENT/ PLAN:  Generalized osteoarthritis DG Hip yesterday with no fracture or dislocation. Suspect pain is related to OA. Already on fentanyl patch and Tylenol. Would favor avoiding further sedating meds. Will instead order directed PT for the hip and can expand Voltaren use to include hip in  addition to neck/shoulders.   Chronic diastolic CHF (congestive heart failure) (HCC) LE edema stable from baseline. Has a cardiology appointment on 12/1. Likely could benefit from a repeat echo given mid-range EF on most recent study. Could consider addition of an SGLT2i today, but patient already dealing with incontinence and increased urination is likely an undesirable side effect. Would defer medication adjustments to cardiology at this time.  - Continue current management  Skin breakdown at gastrostomy tube site [facility]) Improved with addition of Nystatin powder. Recommend continuing PRN Nystatin. Advised patient that she does not necessarily need to dress the area, but she prefers this as otherwise some discharge may  leak around tube and onto her clothes. Site is otherwise clean and dry today. Hopeful she will be able to have tube removed pending cardiac clearance by cardiology next month.   Permanent atrial fibrillation (HCC) Rate controlled.  - Continue Cardizem 158m q8h and Eliquis     BPearla Dubonnet MD Contact [phone] through Friday 8am- 5pm  After hours call [phone]

## 2022-10-28 ENCOUNTER — Ambulatory Visit (INDEPENDENT_AMBULATORY_CARE_PROVIDER_SITE_OTHER): Payer: Medicare HMO | Admitting: Podiatry

## 2022-10-28 ENCOUNTER — Encounter: Payer: Self-pay | Admitting: Podiatry

## 2022-10-28 DIAGNOSIS — G609 Hereditary and idiopathic neuropathy, unspecified: Secondary | ICD-10-CM | POA: Diagnosis not present

## 2022-10-28 DIAGNOSIS — M79674 Pain in right toe(s): Secondary | ICD-10-CM

## 2022-10-28 DIAGNOSIS — M79675 Pain in left toe(s): Secondary | ICD-10-CM | POA: Diagnosis not present

## 2022-10-28 DIAGNOSIS — B351 Tinea unguium: Secondary | ICD-10-CM | POA: Diagnosis not present

## 2022-10-28 DIAGNOSIS — Q828 Other specified congenital malformations of skin: Secondary | ICD-10-CM | POA: Diagnosis not present

## 2022-10-28 NOTE — Progress Notes (Signed)
Subjective:  Patient ID: [name] [name], female    DOB: [birth year],  MRN: [id number]  [name] [name] presents to clinic today for at risk foot care with history of peripheral neuropathy and painful porokeratotic lesion(s) submet head 5 right foot and painful mycotic toenails that limit ambulation. Painful toenails interfere with ambulation. Aggravating factors include wearing enclosed shoe gear. Pain is relieved with periodic professional debridement. Painful porokeratotic lesions are aggravated when weightbearing with and without shoegear. Pain is relieved with periodic professional debridement.  Chief Complaint  Patient presents with   Nail Problem    Nail Trim  Not Diabetic  PCP - Ok Edwards   New problem(s): None.   PCP is Gerlene Fee, NP , and last visit was August 25, 2022.  Allergies  Allergen Reactions   Buprenorphine Hcl Itching   Cymbalta [Duloxetine Hcl] Other (See Comments)    Other reaction(s): Other (See Comments) Personality changes - crying  2014   Morphine And Related Itching   Oxycodone-Acetaminophen Other (See Comments)    Personality changes    Valium [Diazepam] Other (See Comments)    Personality changes - "in another world" ;  Hallucinations  2015   Statins Other (See Comments)    Other reaction(s): Other (See Comments) Muscle weakness Muscle weakness   Morphine Itching   Altace [Ramipril] Other (See Comments)    unknown   Floxin [Ofloxacin] Other (See Comments)    unknown   Hydromorphone Other (See Comments)    Other reaction(s): Confusion (intolerance) unknown   Lovaza [Omega-3-Acid Ethyl Esters] Other (See Comments)    Muscle weakness    Trilipix [Choline Fenofibrate] Other (See Comments)    Muscle weakness    Zetia [Ezetimibe] Other (See Comments)    Muscle weakness    Review of Systems: Negative except as noted in the HPI.  Objective: No changes noted in today's physical examination.  [name] [name] is a pleasant 81 y.o. female  obese in NAD. AAO x 3.  Neurovascular Examination: Capillary refill time to digits immediate b/l. Palpable DP pulse(s) b/l LE. Faintly palpable PT pulse(s) b/l LE. No pain with calf compression b/l. +1 pitting edema noted BLE. No cyanosis or clubbing noted b/l LE.  Pt has subjective symptoms of neuropathy. Protective sensation intact 5/5 intact bilaterally with 10g monofilament b/l. Vibratory sensation intact b/l.  Dermatological:  Ecchymosis noted base of left great toes.  Pedal skin is warm and supple b/l LE. No open wounds b/l LE. No interdigital macerations noted b/l LE. Anonychia b/l great toes. Toenails 2-5 b/l elongated, discolored, dystrophic, thickened, crumbly with subungual debris and tenderness to dorsal palpation. Porokeratotic lesion(s) submet head 5 right foot. No erythema, no edema, no drainage, no fluctuance.  Musculoskeletal:  Muscle strength 5/5 to all lower extremity muscle groups bilaterally. No pain, crepitus or joint limitation noted with ROM bilateral LE. Hammertoe(s) noted to the R 2nd toe.  Assessment/Plan: 1. Pain due to onychomycosis of toenails of both feet   2. Porokeratosis   3. Idiopathic peripheral neuropathy     No orders of the defined types were placed in this encounter.   -Consent given for treatment as described below: -Facility to continue fall precautions and pressure precautions. -Toenails 2-5 bilaterally debrided in length and girth without iatrogenic bleeding with sterile nail nipper and dremel.  -Porokeratotic lesion(s) submet head 5 right foot pared and enucleated with sterile currette without incident. Total number of lesions debrided=1. -Patient/POA to call should there be question/concern in the interim.  Return in about 3 months (around 01/28/2023).  Marzetta Board, DPM

## 2022-11-05 NOTE — Addendum Note (Signed)
Addended by: Gerlene Fee on: 11/05/2022 03:35 PM   Modules accepted: Level of Service

## 2022-11-09 ENCOUNTER — Encounter: Payer: Self-pay | Admitting: Internal Medicine

## 2022-11-09 ENCOUNTER — Non-Acute Institutional Stay (SKILLED_NURSING_FACILITY): Payer: Medicare HMO | Admitting: Internal Medicine

## 2022-11-09 DIAGNOSIS — M5418 Radiculopathy, sacral and sacrococcygeal region: Secondary | ICD-10-CM

## 2022-11-09 NOTE — Assessment & Plan Note (Signed)
Clinically right S2 radiculopathy is suggested.  A trial of gabapentin will be initiated.  Unfortunately she is not a candidate for additional neurosurgical intervention.

## 2022-11-09 NOTE — Patient Instructions (Signed)
See assessment and plan under each diagnosis in the problem list and acutely for this visit 

## 2022-11-09 NOTE — Progress Notes (Unsigned)
NURSING HOME LOCATION:  [facility] ROOM NUMBER:  159 P  CODE STATUS:  DNR  PCP:  Ok Edwards NP  This is a nursing facility follow up visit for specific acute issue of chronic hip pain.  Interim medical record and care since last SNF visit was updated with review of diagnostic studies and change in clinical status since last visit were documented.  HPI: She states that she had the onset of right buttocks area discomfort several weeks ago, possibly 1 month ago.  There was no injury or obvious etiology.  She now describes the discomfort in the right buttocks is constant with slight radiation superiorly into the lower buttock.  X-rays of the hip last week or negative for fracture.  She now describes the pain in the buttocks is "awful and worse and worse."  She states that it also has sharp character and has not responded to Lidoderm patches or topical medications.  She denies any associated stool or urinary incontinence. The pain is in the context of extensive prior neurosurgery.  CT of the chest, abdomen, and pelvis on 03/17/2022 revealed posterior lateral rod and pedicle screw fixators in the lumbar spine with sacral and iliac bone screws.  Degenerative hip arthroplasty was present bilaterally.  Review of systems: She denies significant dysphagia but must chew her food well as she no longer has a lower partial.  She states that she "flushed it down the commode."  Constitutional: No fever, significant weight change, fatigue  Eyes: No redness, discharge, pain, vision change ENT/mouth: No nasal congestion,  purulent discharge, earache, change in hearing, sore throat  Cardiovascular: No chest pain, palpitations, paroxysmal nocturnal dyspnea, claudication, edema  Respiratory: No cough, sputum production, hemoptysis, DOE, significant snoring, apnea   Gastrointestinal: No heartburn, dysphagia, abdominal pain, nausea /vomiting, rectal bleeding, melena, change in  bowels Genitourinary: No dysuria, hematuria, pyuria, incontinence, nocturia Musculoskeletal: No joint stiffness, joint swelling, weakness, pain Dermatologic: No rash, pruritus, change in appearance of skin Neurologic: No dizziness, headache, syncope, seizures, numbness, tingling Psychiatric: No significant anxiety, depression, insomnia, anorexia Endocrine: No change in hair/skin/nails, excessive thirst, excessive hunger, excessive urination  Hematologic/lymphatic: No significant bruising, lymphadenopathy, abnormal bleeding Allergy/immunology: No itchy/watery eyes, significant sneezing, urticaria, angioedema  Physical exam:  Pertinent or positive findings: When initially entered the room she was in the bathroom brushing her teeth and did not realize it was there.  She exhibited no discomfort clinically.  When I returned subsequently she was meeting her ice cream but had left the lunch on tray uneaten.  Facies are sad and she exhibits unwinding cadence to her vocalization.  She is not wearing her upper or lower partials.  She has multiple missing teeth.  Pedal pulses are decreased.  She has trace-1/2+ edema.  She has DIP OA changes of the hands.  Strength in the left lower extremity is greater than the right lower extremity.  Deep tendon reflexes are 1/2+ at the knees.  With straight leg raising she describes discomfort in the right posterior thigh to the posterior knee area. General appearance: Adequately nourished; no acute distress, increased work of breathing is present.   Lymphatic: No lymphadenopathy about the head, neck, axilla. Eyes: No conjunctival inflammation or lid edema is present. There is no scleral icterus. Ears:  External ear exam shows no significant lesions or deformities.   Nose:  External nasal examination shows no deformity or inflammation. Nasal mucosa are pink and moist without lesions, exudates Oral exam:  Lips and gums are  healthy appearing. There is no oropharyngeal erythema  or exudate. Neck:  No thyromegaly, masses, tenderness noted.    Heart:  Normal rate and regular rhythm. S1 and S2 normal without gallop, murmur, click, rub .  Lungs: Chest clear to auscultation without wheezes, rhonchi, rales, rubs. Abdomen: Bowel sounds are normal. Abdomen is soft and nontender with no organomegaly, hernias, masses. GU: Deferred  Extremities:  No cyanosis, clubbing, edema  Neurologic exam : Cn 2-7 intact Strength equal  in upper & lower extremities Balance, Rhomberg, finger to nose testing could not be completed due to clinical state Deep tendon reflexes are equal Skin: Warm & dry w/o tenting. No significant lesions or rash.  See summary under each active problem in the Problem List with associated updated therapeutic plan

## 2022-11-16 ENCOUNTER — Non-Acute Institutional Stay (SKILLED_NURSING_FACILITY): Payer: Medicare HMO | Admitting: Adult Health

## 2022-11-16 ENCOUNTER — Other Ambulatory Visit: Payer: Self-pay | Admitting: Adult Health

## 2022-11-16 ENCOUNTER — Encounter: Payer: Self-pay | Admitting: Adult Health

## 2022-11-16 DIAGNOSIS — M5418 Radiculopathy, sacral and sacrococcygeal region: Secondary | ICD-10-CM | POA: Diagnosis not present

## 2022-11-16 MED ORDER — FENTANYL 25 MCG/HR TD PT72: 1.0000 | MEDICATED_PATCH | TRANSDERMAL | 0 refills | Status: DC

## 2022-11-16 MED ORDER — ESZOPICLONE 3 MG PO TABS: 3.0000 mg | ORAL_TABLET | Freq: Every day | ORAL | 0 refills | Status: DC

## 2022-11-16 NOTE — Progress Notes (Unsigned)
Location:  [place] Room Number: 159 Place of Service:  SNF (31)   CODE STATUS: dnr   Allergies  Allergen Reactions   Buprenorphine Hcl Itching   Cymbalta [Duloxetine Hcl] Other (See Comments)    Other reaction(s): Other (See Comments) Personality changes - crying  2014   Morphine And Related Itching   Oxycodone-Acetaminophen Other (See Comments)    Personality changes    Valium [Diazepam] Other (See Comments)    Personality changes - "in another world" ;  Hallucinations  2015   Statins Other (See Comments)    Other reaction(s): Other (See Comments) Muscle weakness Muscle weakness   Morphine Itching   Altace [Ramipril] Other (See Comments)    unknown   Floxin [Ofloxacin] Other (See Comments)    unknown   Hydromorphone Other (See Comments)    Other reaction(s): Confusion (intolerance) unknown   Lovaza [Omega-3-Acid Ethyl Esters] Other (See Comments)    Muscle weakness    Trilipix [Choline Fenofibrate] Other (See Comments)    Muscle weakness    Zetia [Ezetimibe] Other (See Comments)    Muscle weakness     Chief Complaint  Patient presents with   Acute Visit    Pain management     HPI:  She is having radicular pain. Last week she was place on gabapentin for a trial period. She states that she is getting good relief with the gabapentin and would like for it to be long term. She is declining the use of her lidoderm patch at times. She is on a lower dose of fentanyl at 25 mcg patch; has tolerated this change without difficulty.    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 Abnl nuc 11/2014. Cath 12/2014 - turned down for CABG. Ultimately s/p TTVP,  rotational atherectomy, PTCA and stenting of the ostial LCx and left main into the LAD (crush technique), and IVUS of the LAD/Left main. // b. Myoview 11/17: EF 48, poor quality/significant artifact; inf-lateral, inferior ischemia; Intermediate Risk   Chronic atrial fibrillation ([place])    a.  First noted post-op 9/15 spinal fusion.  She had cardioversion, not on anticoagulation. Fall risk, unsteady. // failed DCCV // Holter 10/17: AFib, Avg HR 97, PVCs, no other arrhythmia   Chronic back pain greater than 3 months duration    a. spinal stenosis.  Spinal fusion with rods in 2/15 at [facility] spinal fusion 9/15.   Chronic diastolic CHF    Echo 9/47:  EF 50-55%, trivial AI, midl MR, mod LAE, PASP 37 mmHg // Echocardiogram 03/2020: EF 45-50, no RWMA, mild LVH, mild reduced RVSF, mildly elevated PASP (RVSP 38.3), mild LAE, mild MR, mild to mod TR, trivial AI // Echo 9/21: EF 45, mild LVH, mildly reduced RV SF, moderate LAE, mild RAE, mild MR, mild AI      Circadian rhythm sleep disorder    CTS (carpal tunnel syndrome)    Deficiency anemia 11/10/2014   Depression    Diarrhea    Diverticulitis of colon    Esophageal stricture    Gastritis    Gastroesophageal hernia 07/06/2013   GERD (gastroesophageal reflux disease)    Hiatal hernia  History of blood transfusion    "most of them related to OR's"    HTN (hypertension)    Hyperlipidemia    IBS (irritable bowel syndrome)    Incontinence 10/13/2012   Insomnia    Ischemic chest pain ([place])    Memory disorder 48/18/5631   Metabolic syndrome 49/70/2637   MGUS (monoclonal gammopathy of unknown significance) dx'd 11/2014   a. Neg BMB 11/2014.   Multiple falls    Obesity    Obstructive sleep apnea    "have mask; don't wear it" (01/07/2015)   Orthostasis    PAT (paroxysmal atrial tachycardia)    Personal history of colonic polyps 10/25/2011 & 12/02/11   not retrieved Dr Lyla Son & tubular adenomas   Pneumonia 03/2014   Sinus bradycardia     a. Baseline HR 50s-60s.   Stroke [facility]) early 2000's   "small"; denies residual on 01/07/2015)    Past Surgical History:  Procedure Laterality Date   BACK SURGERY     BONE MARROW BIOPSY  11/2014   CARDIAC CATHETERIZATION  12/27/2014   Procedure: INTRAVASCULAR PRESSURE WIRE/FFR STUDY;  Surgeon: Larey Dresser, MD;  Location: [facility] CATH LAB;  Service: Cardiovascular;;   CARDIAC CATHETERIZATION N/A 11/25/2016   Procedure: Left Heart Cath and Coronary Angiography;  Surgeon: Sherren Mocha, MD;  Location: [facility] CV LAB;  Service: Cardiovascular;  Laterality: N/A;   CARDIOVERSION N/A 02/13/2016   Procedure: CARDIOVERSION;  Surgeon: Josue Hector, MD;  Location: [place];  Service: Cardiovascular;  Laterality: N/A;   CARPAL TUNNEL RELEASE Right 1980's   CATARACT EXTRACTION Bilateral    CATARACT EXTRACTION W/ INTRAOCULAR LENS  IMPLANT, BILATERAL  2000's   CORONARY ANGIOPLASTY WITH STENT PLACEMENT  01/07/2015   "2"   CORONARY STENT PLACEMENT     DILATION AND CURETTAGE OF UTERUS     ESOPHAGOGASTRODUODENOSCOPY (EGD) WITH ESOPHAGEAL DILATION  "several times"   IR [place] W/FLUORO  09/08/2022   JOINT REPLACEMENT     KNEE ARTHROSCOPY Left 1995   LAPAROSCOPIC CHOLECYSTECTOMY  2003   LEFT HEART CATHETERIZATION WITH CORONARY ANGIOGRAM N/A 12/27/2014   Procedure: LEFT HEART CATHETERIZATION WITH CORONARY ANGIOGRAM;  Surgeon: Larey Dresser, MD;  Location: [facility] CATH LAB;  Service: Cardiovascular;  Laterality: N/A;   PERCUTANEOUS CORONARY ROTOBLATOR INTERVENTION (PCI-R)  01/07/2015   PERCUTANEOUS CORONARY ROTOBLATOR INTERVENTION (PCI-R) N/A 01/07/2015   Procedure: PERCUTANEOUS CORONARY ROTOBLATOR INTERVENTION (PCI-R);  Surgeon: Blane Ohara, MD;  Location: [facility] CATH LAB;  Service: Cardiovascular;  Laterality: N/A;   POSTERIOR FUSION THORACIC SPINE  08/2015   POSTERIOR LUMBAR FUSION  01/2015   TOTAL KNEE ARTHROPLASTY Bilateral 1990's - 2000's    Social History   Socioeconomic  History   Marital status: Widowed    Spouse name: Not on file   Number of children: 2   Years of education: HS   Highest education level: Not on file  Occupational History   Occupation: Licensed conveyancer- retired    Comment: retired   Occupation: retired  Tobacco Use   Smoking status: Never   Smokeless tobacco: Never  Vaping Use   Vaping Use: Never used  Substance and Sexual Activity   Alcohol use: No    Comment: 01/07/2015 "glass of wine at Christmas, maybe"   Drug use: No   Sexual activity: Not Currently  Other Topics Concern   Not on file  Social History Narrative   Patient is right handed   Long term resident of [facility]    Social Determinants  of Health   Financial Resource Strain: Not on file  Food Insecurity: Not on file  Transportation Needs: Not on file  Physical Activity: Inactive (04/04/2020)   Exercise Vital Sign    Days of Exercise per Week: 0 days    Minutes of Exercise per Session: 0 min  Stress: Stress Concern Present (04/04/2020)   [place]    Feeling of Stress : To some extent  Social Connections: Socially Isolated (04/04/2020)   Social Connection and Isolation Panel [NHANES]    Frequency of Communication with Friends and Family: Three times a week    Frequency of Social Gatherings with Friends and Family: Once a week    Attends Religious Services: Never    Marine scientist or Organizations: No    Attends Archivist Meetings: Never    Marital Status: Widowed  Human resources officer Violence: Not on file   Family History  Problem Relation Age of Onset   Coronary artery disease Father    Peripheral vascular disease Father    Coronary artery disease Mother    Coronary artery disease Brother    Colon cancer Sister    Emphysema Sister    Sleep apnea Son    Colon cancer Sister        spread to her brain   Emphysema Brother    Dementia Neg Hx       VITAL SIGNS BP 126/61   Pulse 86    Temp (!) 96.4 F (35.8 C)   Resp 18   Ht _0  (1.676 m)   Wt 170 lb 12.8 oz (77.5 kg)   SpO2 96%   BMI 27.57 kg/m   Outpatient Encounter Medications as of 11/16/2022  Medication Sig   acetaminophen (TYLENOL) 160 MG/5ML liquid Take 20 mLs by mouth every 8 (eight) hours as needed for fever.   Amino Acids-Protein Hydrolys (PRO-STAT PO) Take 30 mLs by mouth 3 (three) times daily. In tube   aspirin EC 81 MG tablet Take 81 mg by mouth daily. Swallow whole.   Balsam [place]-Castor Oil (VENELEX) OINT Apply 1 application. topically daily.   busPIRone (BUSPAR) 7.5 MG tablet Take 7.5 mg by mouth 3 (three) times daily.   Cholecalciferol 25 MCG (1000 UT) tablet Place 1,000 Units into feeding tube daily. 9 am   clobetasol (TEMOVATE) 0.05 % external solution Apply 1 application. topically 2 (two) times a week. Monday and Friday   diclofenac Sodium (VOLTAREN) 1 % GEL Apply 2 g topically 3 (three) times daily. to neck and shoulders for pain management   diltiazem (CARDIZEM) 120 MG tablet Take 120 mg by mouth every 8 (eight) hours.   ELIQUIS 5 MG TABS tablet Take 1 tablet (5 mg total) by mouth 2 (two) times daily.   eszopiclone 3 MG TABS Take 1 tablet (3 mg total) by mouth at bedtime. Take immediately before bedtime   fentaNYL (DURAGESIC) 25 MCG/HR Place 1 patch onto the skin every 3 (three) days.   FLUoxetine (PROZAC) 20 MG/5ML solution Take 60 mg by mouth daily.   furosemide (LASIX) 40 MG tablet Place 40 mg into feeding tube 2 (two) times daily. 9 am   methocarbamol (ROBAXIN) 500 MG tablet Take 500 mg by mouth every 6 (six) hours as needed for muscle spasms.   naloxone (NARCAN) nasal spray 4 mg/0.1 mL Special Instructions: As needed for depressed respirations less than 8/minute. Notify provider if given. If no effectiveness or improvement, contact 911. As  Needed   nitroGLYCERIN (NITROSTAT) 0.4 MG SL tablet Place 1 tablet (0.4 mg total) under the tongue every 5 (five) minutes as needed for chest pain  (up to 3 doses).   Omeprazole-Sodium Bicarbonate (KONVOMEP) 2-84 MG/ML SUSR Give 10 mLs by tube in the morning and at bedtime.   potassium chloride 20 MEQ/15ML (10%) SOLN Take 20 mEq by mouth daily. For potassium supplement   rOPINIRole (REQUIP) 0.5 MG tablet Take 0.5 mg by mouth in the morning and at bedtime.   rosuvastatin (CRESTOR) 10 MG tablet Take 10 mg by mouth daily. 9 am   saccharomyces boulardii (FLORASTOR) 250 MG capsule Take 250 mg by mouth 2 (two) times daily.   No facility-administered encounter medications on file as of 11/16/2022.     SIGNIFICANT DIAGNOSTIC EXAMS  PREVIOUS   02-23-22: TEE:  The left ventricular size is normal.  Mild left ventricular hypertrophy  LV ejection fraction = 45-50%.  Left ventricular systolic function is mildly reduced.  The right ventricle is normal in size and function.  The left atrium is moderately to severely dilated.  The right atrium is mildly to moderately dilated.  Estimated right ventricular systolic pressure is 27 mmHg.  IVC size was normal.  There is no significant valvular stenosis or regurgitation.  There is no pericardial effusion.  There is a pleural effusion present.  Recommend a limited TTE when HR is controlled to reassess   05-23-22: chest x-ray: mild congestive heart failure  07-14-22: chest x-ray:  Changes of bibasilar atelectasis  Nonspecific perihilar inflammatory changes Bilateral spinal fusion hardware   07-20-22: chest x-ray:  Nonspecific perihilar changes Emphysema No improvement compared to 07-14-22.   07-21-22: chest x-ray:  No acute pulmonary process.   09-21-22: swallow study:   Recommend regular textures vs mechanical soft per treating SLP at SNF and thin liquids via cup/straw, alternate solids and liquids, implement effortful/hard swallow with solids, and Pt to sit upright for all eating and drinking and 30+ minutes after (avoid bending over as well). Pt will likely need to proceed with solid foods  cautiously to see if she experiences and regurgitation. Recommend f/u with SLP services at SNF. Ok for PO medication whole with water.   NO NEW EXAMS   LABS REVIEWED: PREVIOUS   02-27-22: glucose 102; bun 13; creat 0.52; k+ 4.8; na++ 134; ca 8.9; GFR>90 04-08-22: wbc 7.2 hgb 7.2; hct 11.9; mcv 35.2 plt 185; glucose 114; bun 11; creat 0.37; k+ 4.5; na++ 132; ca 8.6; GFR >90 05-21-22: glucose 113; bun 19; creat 0.5; k+ 4.2; na++ 135; ca 8.5 gfr>60; protein 5.2; albumin 2.4  05-23-22: wbc 5.4; hgb 9.2; hct 29.6 plt 184; glucose 123; bun 21; creat 0.54; k+ 3.8; na++ 8.2; ca 8.2 gfr >60 07-01-22: glucose 91; bun 16; creat 0.44; k+ 4.1; na++ 135; ca 8.6; gfr>60 07-14-22: wbc 9.9; hgb 10.8; hct 35.3; mcv 83.8 plt 283; glucose 168; bun 24; creat 0.52; k+ 4.2; na++ 133; ca 8.6 gfr >60 urine culture:  blood culture: klebsiella pneumoniae  07-15-22: wbc 7.1; hgb 9.4; hct 31.2; mcv 85.2 plt 246; glucose 134; bun 28; creat 0.52; k+ 4.6; na++ 135; ca 8.2; gfr >60; protein 6.2 albumin 2.5; ast 50 alt 85 alk phos 283  07-19-22: blood culture: no growth  07-20-22: wbc 6.2; hgb 9.9; hct 33.2; mcv 84.9 plt 270; glucose 121; bun 24; creat 0.56; k+ 3.6; na++ 135; ca 8.5 gfr >60; protein 6.3 albumin 2.4; ast 31; alt 65; alk phos 286 07-21-22: c-dff +  07-23-22: wbc 10.6; hgb 35.1; hct 35.1; mcv 84.2 plt 244; glucose 113; bun 27; creat 0.57; k+ 4.6; na++ 135; ca 9.0; gfr >60; ast 44 alt 58; alk phos 258; protein 6.6; albumin 2.6   NO NEW LABS.    Review of Systems  Constitutional:  Negative for malaise/fatigue.  Respiratory:  Negative for cough and shortness of breath.   Cardiovascular:  Negative for chest pain, palpitations and leg swelling.  Gastrointestinal:  Negative for abdominal pain, constipation and heartburn.  Musculoskeletal:  Positive for back pain. Negative for joint pain and myalgias.  Skin: Negative.   Neurological:  Negative for dizziness.  Psychiatric/Behavioral:  The patient is not nervous/anxious.      Physical Exam Constitutional:      General: She is not in acute distress.    Appearance: She is well-developed. She is not diaphoretic.  Neck:     Thyroid: No thyromegaly.  Cardiovascular:     Rate and Rhythm: Normal rate and regular rhythm.     Pulses: Normal pulses.     Heart sounds: Normal heart sounds.  Pulmonary:     Effort: Pulmonary effort is normal. No respiratory distress.     Breath sounds: Normal breath sounds.  Abdominal:     General: Bowel sounds are normal. There is no distension.     Palpations: Abdomen is soft.     Tenderness: There is no abdominal tenderness.     Comments: Peg tube present   Musculoskeletal:        General: Normal range of motion.     Cervical back: Neck supple.     Right lower leg: No edema.     Left lower leg: No edema.  Lymphadenopathy:     Cervical: No cervical adenopathy.  Skin:    General: Skin is warm and dry.  Neurological:     Mental Status: She is alert and oriented to person, place, and time.  Psychiatric:        Mood and Affect: Mood normal.       ASSESSMENT/ PLAN:  TODAY  Right sacral radiculopathy: gabapentin is effective will make 100 mg three times daily long term prescription.    Ok Edwards NP [facility]  call [phone]

## 2022-11-20 ENCOUNTER — Encounter: Payer: Self-pay | Admitting: Cardiovascular Disease

## 2022-11-20 ENCOUNTER — Encounter: Payer: Medicare HMO | Attending: Adult Health | Admitting: Cardiovascular Disease

## 2022-11-20 VITALS — BP 114/52 | HR 78 | Ht 67.0 in | Wt 171.8 lb

## 2022-11-20 DIAGNOSIS — I4821 Permanent atrial fibrillation: Secondary | ICD-10-CM | POA: Diagnosis not present

## 2022-11-20 DIAGNOSIS — I2510 Atherosclerotic heart disease of native coronary artery without angina pectoris: Secondary | ICD-10-CM | POA: Diagnosis not present

## 2022-11-20 DIAGNOSIS — Z01810 Encounter for preprocedural cardiovascular examination: Secondary | ICD-10-CM | POA: Insufficient documentation

## 2022-11-20 DIAGNOSIS — Z7901 Long term (current) use of anticoagulants: Secondary | ICD-10-CM | POA: Diagnosis not present

## 2022-11-20 DIAGNOSIS — R609 Edema, unspecified: Secondary | ICD-10-CM | POA: Insufficient documentation

## 2022-11-20 NOTE — Progress Notes (Signed)
Cardiology Office Note:    Date:  11/20/2022   ID:  [name] [name], DOB 1941/09/11, MRN [id number]  PCP:  Gerlene Fee, NP   [place] Prov[facility] Cardiologist:  Sherren Mocha, MD Cardiology APP:  Liliane Shi, PA-C  Electrophysiologist:  Vickie Epley, MD     Referring MD: Gerlene Fee, NP   Chief Complaint  Patient presents with   Atrial Fibrillation    History of Present Illness:    [name] [name] is a 81 y.o. female with a hx of: Coronary artery disease Cath 12/15: Complex bifurcational distal LM/ostial LCx disease - poor CABG candidate PCI 12/15: DES to distal LM and ostial LCx Myoview 11/17: + Inferior ischemia Cath 12/17: Patent LM/LCx stent; no significant RCA disease Myoview 6/21: No ischemia Primarily Diastolic CHF Echocardiogram 4/21: EF 45-50, mild LVH, mild MR, mild-mod TR, trivial AI Echo 8/21: EF 45>>Dilt DC'd due to low EF>>resumed by EP in 10/21 Permanent atrial fibrillation S/p DCCV 1/17 >> ERAF  >> rate control strategy Intol of beta-blocker, digoxin  Eval by EP (Dr. Quentin Ore) in 10/21>>EF felt to be fairly stable>>Diltiazem resumed Monitor 10/21: Avg HR 92 Hypertension Hyperlipidemia S/p multiple back surgeries; managed with chronic narcotic pain medication Hx of orthostatic hypotension; s/p multiple falls in the past Hepatic steatosis (elevated LFTs) MGUS Hx of CVA  The patient is here with her brother and sister-in-law today.  She has had a difficult time since I have last seen her.  Her last visit here was in February 2023.  Since that time she has had surgery for hiatal hernia and ultimately has required PEG tube placement.  She presents today for cardiac clearance so that she can have her PEG tube removed.  She has been able to swallow and eat again sufficiently that she no longer requires her PEG tube.  She has not had any recent problems with chest pain or shortness of breath.  She continues to struggle with chronic leg  edema.  She is taking diuretics but continues to have leg swelling.  She is not really able to elevate her legs because of her social situation and a skilled nursing facility.  She is ambulating with the aid of a walker.  She denies any orthopnea or PND.  She has no other complaints today.  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 Abnl nuc 11/2014. Cath 12/2014 - turned down for CABG. Ultimately s/p TTVP, rotational atherectomy, PTCA and stenting of the ostial LCx and left main into the LAD (crush technique), and IVUS of the LAD/Left main. // b. Myoview 11/17: EF 48, poor quality/significant artifact; inf-lateral, inferior ischemia; Intermediate Risk   Chronic atrial fibrillation ([place])    a.  First noted post-op 9/15 spinal fusion.  She had cardioversion, not on anticoagulation. Fall risk, unsteady. // failed DCCV // Holter 10/17: AFib, Avg HR 97, PVCs, no other arrhythmia   Chronic back pain greater than 3 months duration    a. spinal stenosis.  Spinal fusion with rods in 2/15 at [facility] spinal fusion 9/15.   Chronic diastolic CHF    Echo 7/61:  EF 50-55%, trivial AI, midl MR, mod  LAE, PASP 37 mmHg // Echocardiogram 03/2020: EF 45-50, no RWMA, mild LVH, mild reduced RVSF, mildly elevated PASP (RVSP 38.3), mild LAE, mild MR, mild to mod TR, trivial AI // Echo 9/21: EF 45, mild LVH, mildly reduced RV SF, moderate LAE, mild RAE, mild MR, mild AI      Circadian rhythm sleep disorder    CTS (carpal tunnel syndrome)    Deficiency anemia 11/10/2014   Depression    Diarrhea    Diverticulitis of colon    Esophageal stricture    Gastritis    Gastroesophageal hernia 07/06/2013   GERD (gastroesophageal  reflux disease)    Hiatal hernia    History of blood transfusion    "most of them related to OR's"    HTN (hypertension)    Hyperlipidemia    IBS (irritable bowel syndrome)    Incontinence 10/13/2012   Insomnia    Ischemic chest pain ([place])    Memory disorder 58/83/2549   Metabolic syndrome 82/64/1583   MGUS (monoclonal gammopathy of unknown significance) dx'd 11/2014   a. Neg BMB 11/2014.   Multiple falls    Obesity    Obstructive sleep apnea    "have mask; don't wear it" (01/07/2015)   Orthostasis    PAT (paroxysmal atrial tachycardia)    Personal history of colonic polyps 10/25/2011 & 12/02/11   not retrieved Dr Lyla Son & tubular adenomas   Pneumonia 03/2014   Sinus bradycardia    a. Baseline HR 50s-60s.   Stroke [facility]) early 2000's   "small"; denies residual on 01/07/2015)    Past Surgical History:  Procedure Laterality Date   BACK SURGERY     BONE MARROW BIOPSY  11/2014   CARDIAC CATHETERIZATION  12/27/2014   Procedure: INTRAVASCULAR PRESSURE WIRE/FFR STUDY;  Surgeon: Larey Dresser, MD;  Location: [facility] CATH LAB;  Service: Cardiovascular;;   CARDIAC CATHETERIZATION N/A 11/25/2016   Procedure: Left Heart Cath and Coronary Angiography;  Surgeon: Sherren Mocha, MD;  Location: [place] CV LAB;  Service: Cardiovascular;  Laterality: N/A;   CARDIOVERSION N/A 02/13/2016   Procedure: CARDIOVERSION;  Surgeon: Josue Hector, MD;  Location: [place];  Service: Cardiovascular;  Laterality: N/A;   CARPAL TUNNEL RELEASE Right 1980's   CATARACT EXTRACTION Bilateral    CATARACT EXTRACTION W/ INTRAOCULAR LENS  IMPLANT, BILATERAL  2000's   CORONARY ANGIOPLASTY WITH STENT PLACEMENT  01/07/2015   "2"   CORONARY STENT PLACEMENT     DILATION AND CURETTAGE OF UTERUS     ESOPHAGOGASTRODUODENOSCOPY (EGD) WITH ESOPHAGEAL DILATION  "several times"   IR [place] W/FLUORO  09/08/2022   JOINT REPLACEMENT     KNEE ARTHROSCOPY Left 1995   LAPAROSCOPIC CHOLECYSTECTOMY   2003   LEFT HEART CATHETERIZATION WITH CORONARY ANGIOGRAM N/A 12/27/2014   Procedure: LEFT HEART CATHETERIZATION WITH CORONARY ANGIOGRAM;  Surgeon: Larey Dresser, MD;  Location: [facility] CATH LAB;  Service: Cardiovascular;  Laterality: N/A;   PERCUTANEOUS CORONARY ROTOBLATOR INTERVENTION (PCI-R)  01/07/2015   PERCUTANEOUS CORONARY ROTOBLATOR INTERVENTION (PCI-R) N/A 01/07/2015   Procedure: PERCUTANEOUS CORONARY ROTOBLATOR INTERVENTION (PCI-R);  Surgeon: Blane Ohara, MD;  Location: [facility] CATH LAB;  Service: Cardiovascular;  Laterality: N/A;   POSTERIOR FUSION THORACIC SPINE  08/2015   POSTERIOR LUMBAR FUSION  01/2015   TOTAL KNEE ARTHROPLASTY Bilateral 1990's - 2000's    Current Medications: Current Meds  Medication Sig   acetaminophen (TYLENOL) 160 MG/5ML liquid Take 20 mLs by mouth every 8 (eight) hours as needed for fever.  Amino Acids-Protein Hydrolys (PRO-STAT PO) Take 30 mLs by mouth 3 (three) times daily. In tube   aspirin EC 81 MG tablet Take 81 mg by mouth daily. Swallow whole.   Balsam [place]-Castor Oil (VENELEX) OINT Apply 1 application. topically daily.   busPIRone (BUSPAR) 7.5 MG tablet Take 7.5 mg by mouth 3 (three) times daily.   Cholecalciferol 25 MCG (1000 UT) tablet Place 1,000 Units into feeding tube daily. 9 am   clobetasol (TEMOVATE) 0.05 % external solution Apply 1 application. topically 2 (two) times a week. Monday and Friday   diclofenac Sodium (VOLTAREN) 1 % GEL Apply 2 g topically 3 (three) times daily. to neck and shoulders for pain management   diltiazem (CARDIZEM) 120 MG tablet Take 120 mg by mouth every 8 (eight) hours.   ELIQUIS 5 MG TABS tablet Take 1 tablet (5 mg total) by mouth 2 (two) times daily.   eszopiclone 3 MG TABS Take 1 tablet (3 mg total) by mouth at bedtime. Take immediately before bedtime   fentaNYL (DURAGESIC) 25 MCG/HR Place 1 patch onto the skin every 3 (three) days.   FLUoxetine (PROZAC) 20 MG/5ML solution Take 60 mg by mouth daily.   furosemide  (LASIX) 40 MG tablet Place 40 mg into feeding tube 2 (two) times daily. 9 am   gabapentin (NEURONTIN) 100 MG capsule Take 100 mg by mouth 3 (three) times daily.   methocarbamol (ROBAXIN) 500 MG tablet Take 500 mg by mouth every 6 (six) hours as needed for muscle spasms.   naloxone (NARCAN) nasal spray 4 mg/0.1 mL Special Instructions: As needed for depressed respirations less than 8/minute. Notify provider if given. If no effectiveness or improvement, contact 911. As Needed   nitroGLYCERIN (NITROSTAT) 0.4 MG SL tablet Place 1 tablet (0.4 mg total) under the tongue every 5 (five) minutes as needed for chest pain (up to 3 doses).   Omeprazole-Sodium Bicarbonate (KONVOMEP) 2-84 MG/ML SUSR Give 10 mLs by tube in the morning and at bedtime.   potassium chloride 20 MEQ/15ML (10%) SOLN Take 20 mEq by mouth daily. For potassium supplement   rOPINIRole (REQUIP) 0.5 MG tablet Take 0.5 mg by mouth in the morning and at bedtime.   rosuvastatin (CRESTOR) 10 MG tablet Take 10 mg by mouth daily. 9 am   saccharomyces boulardii (FLORASTOR) 250 MG capsule Take 250 mg by mouth 2 (two) times daily.     Allergies:   Buprenorphine hcl, Cymbalta [duloxetine hcl], Morphine and related, Oxycodone-acetaminophen, Valium [diazepam], Statins, Morphine, Altace [ramipril], Floxin [ofloxacin], Hydromorphone, Lovaza [omega-3-acid ethyl esters], Trilipix [choline fenofibrate], and Zetia [ezetimibe]   Social History   Socioeconomic History   Marital status: Widowed    Spouse name: Not on file   Number of children: 2   Years of education: HS   Highest education level: Not on file  Occupational History   Occupation: Licensed conveyancer- retired    Comment: retired   Occupation: retired  Tobacco Use   Smoking status: Never   Smokeless tobacco: Never  Vaping Use   Vaping Use: Never used  Substance and Sexual Activity   Alcohol use: No    Comment: 01/07/2015 "glass of wine at Christmas, maybe"   Drug use: No   Sexual activity:  Not Currently  Other Topics Concern   Not on file  Social History Narrative   Patient is right handed   Long term resident of [facility]    Social Determinants of Health   Financial Resource Strain: Not on file  Food Insecurity:  Not on file  Transportation Needs: Not on file  Physical Activity: Inactive (04/04/2020)   Exercise Vital Sign    Days of Exercise per Week: 0 days    Minutes of Exercise per Session: 0 min  Stress: Stress Concern Present (04/04/2020)   [place]    Feeling of Stress : To some extent  Social Connections: Socially Isolated (04/04/2020)   Social Connection and Isolation Panel [NHANES]    Frequency of Communication with Friends and Family: Three times a week    Frequency of Social Gatherings with Friends and Family: Once a week    Attends Religious Services: Never    Marine scientist or Organizations: No    Attends Archivist Meetings: Never    Marital Status: Widowed     Family History: The patient's family history includes Colon cancer in her sister and sister; Coronary artery disease in her brother, father, and mother; Emphysema in her brother and sister; Peripheral vascular disease in her father; Sleep apnea in her son. There is no history of Dementia.  ROS:   Please see the history of present illness.    All other systems reviewed and are negative.  EKGs/Labs/Other Studies Reviewed:    The following studies were reviewed today: Echo 10/27/2021:  1. Left ventricular ejection fraction, by estimation, is 50 to 55%. The  left ventricle has low normal function. The left ventricle has no regional  wall motion abnormalities. There is mild left ventricular hypertrophy.  Left ventricular diastolic  parameters are indeterminate.   2. Right ventricular systolic function is normal. The right ventricular  size is normal.   3. Left atrial size was mildly dilated.   4. Right atrial size  was mildly dilated.   5. The mitral valve is abnormal. Trivial mitral valve regurgitation. No  evidence of mitral stenosis.   6. The aortic valve is tricuspid. There is moderate calcification of the  aortic valve. Aortic valve regurgitation is trivial. Mild to moderate  aortic valve sclerosis/calcification is present, without any evidence of  aortic stenosis.   7. The inferior vena cava is normal in size with greater than 50%  respiratory variability, suggesting right atrial pressure of 3 mmHg.   EKG:  EKG is ordered today.  The ekg ordered today demonstrates atrial fibrillation 78 bpm, nonspecific T wave abnormality, otherwise normal.  Recent Labs: 07/23/2022: ALT 58; BUN 27; Creatinine, Ser 0.57; Hemoglobin 10.6; Platelets 244; Potassium 4.6; Sodium 135  Recent Lipid Panel    Component Value Date/Time   CHOL 138 07/21/2021 1557   CHOL 156 05/11/2013 1536   TRIG 81 10/24/2021 1420   TRIG 121 02/18/2016 1504   TRIG 239 (H) 05/11/2013 1536   HDL 70 07/21/2021 1557   HDL 57 02/18/2016 1504   HDL 43 05/11/2013 1536   CHOLHDL 2.0 07/21/2021 1557   LDLCALC 47 07/21/2021 1557   LDLCALC 50 08/21/2014 1141   LDLCALC 65 05/11/2013 1536     Risk Assessment/Calculations:    CHA2DS2-VASc Score = 6   This indicates a 9.7% annual risk of stroke. The patient's score is based upon: CHF History: 1 HTN History: 1 Diabetes History: 0 Stroke History: 0 Vascular Disease History: 1 Age Score: 2 Gender Score: 1               Physical Exam:    VS:  BP (!) 114/52   Pulse 78   Ht _0  (1.702 m)  Wt 171 lb 12.8 oz (77.9 kg)   SpO2 95%   BMI 26.91 kg/m     Wt Readings from Last 3 Encounters:  11/20/22 171 lb 12.8 oz (77.9 kg)  11/16/22 170 lb 12.8 oz (77.5 kg)  10/16/22 167 lb 9.6 oz (76 kg)     GEN:  Well nourished, well developed in no acute distress HEENT: Normal NECK: No JVD; No carotid bruits LYMPHATICS: No lymphadenopathy CARDIAC: irregularly irregular, no murmurs,  rubs, gallops RESPIRATORY:  Clear to auscultation without rales, wheezing or rhonchi  ABDOMEN: Soft, non-tender, non-distended MUSCULOSKELETAL:  1+ bilateral pretibial edema; No deformity  SKIN: Warm and dry NEUROLOGIC:  Alert and oriented x 3 PSYCHIATRIC:  Normal affect   ASSESSMENT:    1. Chronic anticoagulation   2. Permanent atrial fibrillation ([name] [name])   3. Coronary artery disease involving native coronary artery of native heart without angina pectoris   4. Chronic edema   5. Preop cardiovascular exam    PLAN:    In order of problems listed above:  Tolerating apixaban without bleeding problems.  Continue current therapy.  CHA2DS2-VASc score is 5 and oral anticoagulation is indicated. Heart rate is better controlled than in the past.  She appears asymptomatic.  She remains on diltiazem for heart rate control.  No changes are made today. Stable without any symptoms of angina.  Remains on aspirin for antiplatelet therapy considering history of left main stenting.  Continue rosuvastatin 10 mg daily. Suspect largely due to immobility and venous insufficiency.  Given information on compression and leg elevation today.  Continue furosemide 40 mg twice daily. The patient is clinically stable with good control of her atrial fibrillation, no angina, and no clinical evidence of decompensated heart failure.  I think she can proceed with PEG tube removal if needed, at low risk of cardiac complications from this low risk surgical procedure.           Medication Adjustments/Labs and Tests Ordered: Current medicines are reviewed at length with the patient today.  Concerns regarding medicines are outlined above.  Orders Placed This Encounter  Procedures   EKG 12-Lead   No orders of the defined types were placed in this encounter.   Patient Instructions  Medication Instructions:  Your physician recommends that you continue on your current medications as directed. Please refer to the Current  Medication list given to you today.  *If you need a refill on your cardiac medications before your next appointment, please call your pharmacy*  Lab Work: NONE If you have labs (blood work) drawn today and your tests are completely normal, you will receive your results only by: [place] (if you have MyChart) OR A paper copy in the mail If you have any lab test that is abnormal or we need to change your treatment, we will call you to review the results.  Testing/Procedures: **Clear, from cardiac standpoint, for PEG tube removal**  Follow-Up: At [facility], you and your health needs are our priority.  As part of our continuing mission to provide you with exceptional heart care, we have created designated Provider Care Teams.  These Care Teams include your primary Cardiologist (physician) and Advanced Practice Providers (APPs -  Physician Assistants and Nurse Practitioners) who all work together to provide you with the care you need, when you need it.  Your next appointment:   6 month(s)  The format for your next appointment:   In Person  Provider:   Nicholes Rough, PA-C, Melina Copa, PA-C,  Ambrose Pancoast, NP, Ermalinda Barrios, PA-C, Christen Bame, NP, or Richardson Dopp, PA-C     Then, Sherren Mocha, MD will plan to see you again in 1 year(s).    Other Instructions   For your  leg edema you  should do  the following 1. Leg elevation - I recommend the Lounge Dr. Leg rest.  See below for details  2. Salt restriction  -  Use potassium chloride instead of regular salt as a salt substitute. 3. Walk regularly 4. Compression hose - Medical Supply store  5. Weight loss    Available on [email or web address] Or  Go to [email or web address]      Important Information About Sugar         Signed, Sherren Mocha, MD  11/20/2022 5:07 PM    [name] [name]

## 2022-11-20 NOTE — Patient Instructions (Signed)
Medication Instructions:  Your physician recommends that you continue on your current medications as directed. Please refer to the Current Medication list given to you today.  *If you need a refill on your cardiac medications before your next appointment, please call your pharmacy*  Lab Work: NONE If you have labs (blood work) drawn today and your tests are completely normal, you will receive your results only by: [place] (if you have MyChart) OR A paper copy in the mail If you have any lab test that is abnormal or we need to change your treatment, we will call you to review the results.  Testing/Procedures: **Clear, from cardiac standpoint, for PEG tube removal**  Follow-Up: At [facility], you and your health needs are our priority.  As part of our continuing mission to provide you with exceptional heart care, we have created designated Provider Care Teams.  These Care Teams include your primary Cardiologist (physician) and Advanced Practice Providers (APPs -  Physician Assistants and Nurse Practitioners) who all work together to provide you with the care you need, when you need it.  Your next appointment:   6 month(s)  The format for your next appointment:   In Person  Provider:   Nicholes Rough, PA-C, Melina Copa, PA-C, Ambrose Pancoast, NP, Ermalinda Barrios, PA-C, Christen Bame, NP, or Richardson Dopp, PA-C     Then, Sherren Mocha, MD will plan to see you again in 1 year(s).    Other Instructions   For your  leg edema you  should do  the following 1. Leg elevation - I recommend the Lounge Dr. Leg rest.  See below for details  2. Salt restriction  -  Use potassium chloride instead of regular salt as a salt substitute. 3. Walk regularly 4. Compression hose - Medical Supply store  5. Weight loss    Available on [email or web address] Or  Go to [email or web address]      Important Information About Sugar

## 2022-11-24 ENCOUNTER — Non-Acute Institutional Stay (SKILLED_NURSING_FACILITY): Payer: Medicare HMO | Admitting: Adult Health

## 2022-11-24 ENCOUNTER — Encounter: Payer: Self-pay | Admitting: Adult Health

## 2022-11-24 DIAGNOSIS — I25110 Atherosclerotic heart disease of native coronary artery with unstable angina pectoris: Secondary | ICD-10-CM | POA: Diagnosis not present

## 2022-11-24 DIAGNOSIS — I5032 Chronic diastolic (congestive) heart failure: Secondary | ICD-10-CM

## 2022-11-24 DIAGNOSIS — F339 Major depressive disorder, recurrent, unspecified: Secondary | ICD-10-CM

## 2022-11-24 DIAGNOSIS — I4821 Permanent atrial fibrillation: Secondary | ICD-10-CM

## 2022-11-24 NOTE — Progress Notes (Unsigned)
Location:  [place] Room Number: NO/159/P Place of Service:  SNF (31) [name] Edwards S.,NP  CODE STATUS: DNR  Allergies  Allergen Reactions   Buprenorphine Hcl Itching   Cymbalta [Duloxetine Hcl] Other (See Comments)    Other reaction(s): Other (See Comments) Personality changes - crying  2014   Morphine And Related Itching   Oxycodone-Acetaminophen Other (See Comments)    Personality changes    Valium [Diazepam] Other (See Comments)    Personality changes - "in another world" ;  Hallucinations  2015   Statins Other (See Comments)    Other reaction(s): Other (See Comments) Muscle weakness Muscle weakness   Morphine Itching   Altace [Ramipril] Other (See Comments)    unknown   Floxin [Ofloxacin] Other (See Comments)    unknown   Hydromorphone Other (See Comments)    Other reaction(s): Confusion (intolerance) unknown   Lovaza [Omega-3-Acid Ethyl Esters] Other (See Comments)    Muscle weakness    Trilipix [Choline Fenofibrate] Other (See Comments)    Muscle weakness    Zetia [Ezetimibe] Other (See Comments)    Muscle weakness     Chief Complaint  Patient presents with   Medical Management of Chronic Issues    Patient is here for a follow up for chronic conditions    Quality Metric Gaps    Discuss need for updated vaccines    HPI:    Past Medical History:  Diagnosis Date   Abnormal nuclear cardiac imaging test    Anemia, iron deficiency 07/02/2015   Anxiety    Arthritis    "knees, back, fingers, toes; joints" (01/07/2015)   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 Abnl nuc 11/2014. Cath 12/2014 - turned down for CABG. Ultimately s/p TTVP, rotational atherectomy, PTCA and stenting of the ostial LCx and left main into the LAD (crush technique), and IVUS of the LAD/Left main. // b. Myoview 11/17: EF 48, poor quality/significant artifact;  inf-lateral, inferior ischemia; Intermediate Risk   Chronic atrial fibrillation ([place])    a.  First noted post-op 9/15 spinal fusion.  She had cardioversion, not on anticoagulation. Fall risk, unsteady. // failed DCCV // Holter 10/17: AFib, Avg HR 97, PVCs, no other arrhythmia   Chronic back pain greater than 3 months duration    a. spinal stenosis.  Spinal fusion with rods in 2/15 at [facility] spinal fusion 9/15.   Chronic diastolic CHF    Echo 3/61:  EF 50-55%, trivial AI, midl MR, mod LAE, PASP 37 mmHg // Echocardiogram 03/2020: EF 45-50, no RWMA, mild LVH, mild reduced RVSF, mildly elevated PASP (RVSP 38.3), mild LAE, mild MR, mild to mod TR, trivial AI // Echo 9/21: EF 45, mild LVH, mildly reduced RV SF, moderate LAE, mild RAE, mild MR, mild AI      Circadian rhythm sleep disorder    CTS (carpal tunnel syndrome)    Deficiency anemia 11/10/2014   Depression    Diarrhea    Diverticulitis of colon    Esophageal stricture    Gastritis    Gastroesophageal hernia 07/06/2013   GERD (gastroesophageal reflux disease)    Hiatal hernia    History of blood transfusion    "most of them related to OR's"    HTN (hypertension)    Hyperlipidemia    IBS (irritable bowel syndrome)    Incontinence 10/13/2012   Insomnia    Ischemic  chest pain ([place])    Memory disorder 10/14/8526   Metabolic syndrome 78/24/2353   MGUS (monoclonal gammopathy of unknown significance) dx'd 11/2014   a. Neg BMB 11/2014.   Multiple falls    Obesity    Obstructive sleep apnea    "have mask; don't wear it" (01/07/2015)   Orthostasis    PAT (paroxysmal atrial tachycardia)    Personal history of colonic polyps 10/25/2011 & 12/02/11   not retrieved Dr Lyla Son & tubular adenomas   Pneumonia 03/2014   Sinus bradycardia    a. Baseline HR 50s-60s.   Stroke [facility]) early 2000's   "small"; denies residual on 01/07/2015)    Past Surgical History:  Procedure Laterality Date   BACK SURGERY     BONE MARROW BIOPSY   11/2014   CARDIAC CATHETERIZATION  12/27/2014   Procedure: INTRAVASCULAR PRESSURE WIRE/FFR STUDY;  Surgeon: Larey Dresser, MD;  Location: [facility] CATH LAB;  Service: Cardiovascular;;   CARDIAC CATHETERIZATION N/A 11/25/2016   Procedure: Left Heart Cath and Coronary Angiography;  Surgeon: Sherren Mocha, MD;  Location: [place] CV LAB;  Service: Cardiovascular;  Laterality: N/A;   CARDIOVERSION N/A 02/13/2016   Procedure: CARDIOVERSION;  Surgeon: Josue Hector, MD;  Location: [place];  Service: Cardiovascular;  Laterality: N/A;   CARPAL TUNNEL RELEASE Right 1980's   CATARACT EXTRACTION Bilateral    CATARACT EXTRACTION W/ INTRAOCULAR LENS  IMPLANT, BILATERAL  2000's   CORONARY ANGIOPLASTY WITH STENT PLACEMENT  01/07/2015   "2"   CORONARY STENT PLACEMENT     DILATION AND CURETTAGE OF UTERUS     ESOPHAGOGASTRODUODENOSCOPY (EGD) WITH ESOPHAGEAL DILATION  "several times"   IR Alexander W/FLUORO  09/08/2022   JOINT REPLACEMENT     KNEE ARTHROSCOPY Left 1995   LAPAROSCOPIC CHOLECYSTECTOMY  2003   LEFT HEART CATHETERIZATION WITH CORONARY ANGIOGRAM N/A 12/27/2014   Procedure: LEFT HEART CATHETERIZATION WITH CORONARY ANGIOGRAM;  Surgeon: Larey Dresser, MD;  Location: [facility] CATH LAB;  Service: Cardiovascular;  Laterality: N/A;   PERCUTANEOUS CORONARY ROTOBLATOR INTERVENTION (PCI-R)  01/07/2015   PERCUTANEOUS CORONARY ROTOBLATOR INTERVENTION (PCI-R) N/A 01/07/2015   Procedure: PERCUTANEOUS CORONARY ROTOBLATOR INTERVENTION (PCI-R);  Surgeon: Blane Ohara, MD;  Location: [facility] CATH LAB;  Service: Cardiovascular;  Laterality: N/A;   POSTERIOR FUSION THORACIC SPINE  08/2015   POSTERIOR LUMBAR FUSION  01/2015   TOTAL KNEE ARTHROPLASTY Bilateral 1990's - 2000's    Social History   Socioeconomic History   Marital status: Widowed    Spouse name: Not on file   Number of children: 2   Years of education: HS   Highest education level: Not on file  Occupational History   Occupation:  Licensed conveyancer- retired    Comment: retired   Occupation: retired  Tobacco Use   Smoking status: Never   Smokeless tobacco: Never  Vaping Use   Vaping Use: Never used  Substance and Sexual Activity   Alcohol use: No    Comment: 01/07/2015 "glass of wine at Christmas, maybe"   Drug use: No   Sexual activity: Not Currently  Other Topics Concern   Not on file  Social History Narrative   Patient is right handed   Long term resident of [facility]    Social Determinants of Health   Financial Resource Strain: Not on file  Food Insecurity: Not on file  Transportation Needs: Not on file  Physical Activity: Inactive (04/04/2020)   Exercise Vital Sign    Days of Exercise per Week: 0  days    Minutes of Exercise per Session: 0 min  Stress: Stress Concern Present (04/04/2020)   [place]    Feeling of Stress : To some extent  Social Connections: Socially Isolated (04/04/2020)   Social Connection and Isolation Panel [NHANES]    Frequency of Communication with Friends and Family: Three times a week    Frequency of Social Gatherings with Friends and Family: Once a week    Attends Religious Services: Never    Marine scientist or Organizations: No    Attends Archivist Meetings: Never    Marital Status: Widowed  Human resources officer Violence: Not on file   Family History  Problem Relation Age of Onset   Coronary artery disease Father    Peripheral vascular disease Father    Coronary artery disease Mother    Coronary artery disease Brother    Colon cancer Sister    Emphysema Sister    Sleep apnea Son    Colon cancer Sister        spread to her brain   Emphysema Brother    Dementia Neg Hx       VITAL SIGNS BP 130/60   Pulse 77   Temp (!) 96.8 F (36 C)   Resp 18   Ht _0  (1.702 m)   Wt 172 lb (78 kg)   SpO2 91%   BMI 26.94 kg/m   Outpatient Encounter Medications as of 11/24/2022  Medication Sig    acetaminophen (TYLENOL) 160 MG/5ML liquid Take 20 mLs by mouth every 8 (eight) hours as needed for fever.   aspirin EC 81 MG tablet Take 81 mg by mouth daily. Swallow whole.   busPIRone (BUSPAR) 7.5 MG tablet Take 7.5 mg by mouth 3 (three) times daily.   Cholecalciferol 25 MCG (1000 UT) tablet Place 1,000 Units into feeding tube daily. 9 am   clobetasol (TEMOVATE) 0.05 % external solution Apply 1 application. topically 2 (two) times a week. Monday and Friday   diclofenac Sodium (VOLTAREN) 1 % GEL Apply 2 g topically 3 (three) times daily. to neck and shoulders for pain management   diltiazem (CARDIZEM) 120 MG tablet Take 120 mg by mouth every 8 (eight) hours.   ELIQUIS 5 MG TABS tablet Take 1 tablet (5 mg total) by mouth 2 (two) times daily.   eszopiclone 3 MG TABS Take 1 tablet (3 mg total) by mouth at bedtime. Take immediately before bedtime   fentaNYL (DURAGESIC) 25 MCG/HR Place 1 patch onto the skin every 3 (three) days.   FLUoxetine (PROZAC) 20 MG/5ML solution Take 60 mg by mouth daily.   furosemide (LASIX) 40 MG tablet Place 40 mg into feeding tube 2 (two) times daily. 9 am   gabapentin (NEURONTIN) 100 MG capsule Take 100 mg by mouth 3 (three) times daily.   methocarbamol (ROBAXIN) 500 MG tablet Take 500 mg by mouth every 6 (six) hours as needed for muscle spasms.   naloxone (NARCAN) nasal spray 4 mg/0.1 mL Special Instructions: As needed for depressed respirations less than 8/minute. Notify provider if given. If no effectiveness or improvement, contact 911. As Needed   nitroGLYCERIN (NITROSTAT) 0.4 MG SL tablet Place 1 tablet (0.4 mg total) under the tongue every 5 (five) minutes as needed for chest pain (up to 3 doses).   Omeprazole-Sodium Bicarbonate (KONVOMEP) 2-84 MG/ML SUSR Give 10 mLs by tube in the morning and at bedtime.   potassium chloride 20 MEQ/15ML (10%)  SOLN Take 20 mEq by mouth daily. For potassium supplement   rOPINIRole (REQUIP) 0.5 MG tablet Take 0.5 mg by mouth in the  morning and at bedtime.   rosuvastatin (CRESTOR) 10 MG tablet Take 10 mg by mouth daily. 9 am   saccharomyces boulardii (FLORASTOR) 250 MG capsule Take 250 mg by mouth 2 (two) times daily.   [DISCONTINUED] Amino Acids-Protein Hydrolys (PRO-STAT PO) Take 30 mLs by mouth 3 (three) times daily. In tube   [DISCONTINUED] Balsam [place]-Castor Oil (VENELEX) OINT Apply 1 application. topically daily.   No facility-administered encounter medications on file as of 11/24/2022.     SIGNIFICANT DIAGNOSTIC EXAMS       ASSESSMENT/ PLAN:     [name] Edwards NP [facility]  Contact [phone] Monday through Friday 8am- 5pm  After hours call [phone]

## 2022-12-03 ENCOUNTER — Encounter: Payer: Self-pay | Admitting: Adult Health

## 2022-12-03 ENCOUNTER — Other Ambulatory Visit: Payer: Self-pay | Admitting: Adult Health

## 2022-12-03 ENCOUNTER — Non-Acute Institutional Stay (SKILLED_NURSING_FACILITY): Payer: Medicare HMO | Admitting: Adult Health

## 2022-12-03 DIAGNOSIS — G894 Chronic pain syndrome: Secondary | ICD-10-CM | POA: Diagnosis not present

## 2022-12-03 DIAGNOSIS — M5418 Radiculopathy, sacral and sacrococcygeal region: Secondary | ICD-10-CM

## 2022-12-03 MED ORDER — TRAMADOL HCL 50 MG PO TABS: 25.0000 mg | ORAL_TABLET | Freq: Three times a day (TID) | ORAL | 0 refills | Status: DC

## 2022-12-03 NOTE — Progress Notes (Signed)
Location:  [place] Room Number: 470 Place of Service:  SNF (31) Provider:  Ok Edwards, NP   CODE STATUS: DNR  Allergies  Allergen Reactions   Buprenorphine Hcl Itching   Cymbalta [Duloxetine Hcl] Other (See Comments)    Other reaction(s): Other (See Comments) Personality changes - crying  2014   Morphine And Related Itching   Oxycodone-Acetaminophen Other (See Comments)    Personality changes    Valium [Diazepam] Other (See Comments)    Personality changes - "in another world" ;  Hallucinations  2015   Statins Other (See Comments)    Other reaction(s): Other (See Comments) Muscle weakness Muscle weakness   Morphine Itching   Altace [Ramipril] Other (See Comments)    unknown   Floxin [Ofloxacin] Other (See Comments)    unknown   Hydromorphone Other (See Comments)    Other reaction(s): Confusion (intolerance) unknown   Lovaza [Omega-3-Acid Ethyl Esters] Other (See Comments)    Muscle weakness    Trilipix [Choline Fenofibrate] Other (See Comments)    Muscle weakness    Zetia [Ezetimibe] Other (See Comments)    Muscle weakness     Chief Complaint  Patient presents with   Acute Visit    Pain management    HPI:  She is presently taking fentanyl 25 mcg patch for pain management; lidoderm patch; voltaren gel; gabapentin. She tells me that she is not getting enough relief with her pain; especially in her lower back. She tells me that her is getting sciatic relief with the gabapentin. She is wanting her hydrocodone restarted. She has just discovered that her patch was lowered to 25 mcg. She is wanting her pain patch returned to the previous dose.   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 Abnl nuc 11/2014. Cath 12/2014 - turned down for CABG. Ultimately s/p TTVP, rotational atherectomy, PTCA and stenting of the ostial LCx and left main into the LAD (crush technique), and IVUS of the LAD/Left main. // b. Myoview 11/17: EF 48, poor quality/significant artifact; inf-lateral, inferior ischemia; Intermediate Risk   Chronic atrial fibrillation ([place])    a.  First noted post-op 9/15 spinal fusion.  She had cardioversion, not on anticoagulation. Fall risk, unsteady. // failed DCCV // Holter 10/17: AFib, Avg HR 97, PVCs, no other arrhythmia   Chronic back pain greater than 3 months duration    a. spinal stenosis.  Spinal fusion with rods in 2/15 at [facility] spinal fusion 9/15.   Chronic diastolic CHF    Echo 9/62:  EF 50-55%, trivial AI, midl MR, mod LAE, PASP 37 mmHg // Echocardiogram 03/2020: EF 45-50, no RWMA, mild LVH, mild reduced RVSF, mildly elevated PASP (RVSP 38.3), mild LAE, mild MR, mild to mod TR, trivial AI // Echo 9/21: EF 45, mild LVH, mildly reduced RV SF, moderate LAE, mild RAE, mild MR, mild AI      Circadian rhythm sleep disorder    CTS (carpal tunnel syndrome)    Deficiency anemia 11/10/2014   Depression    Diarrhea    Diverticulitis of colon    Esophageal stricture    Gastritis    Gastroesophageal hernia 07/06/2013  GERD (gastroesophageal reflux disease)    Hiatal hernia    History of blood transfusion    "most of them related to OR's"    HTN (hypertension)    Hyperlipidemia    IBS (irritable bowel syndrome)    Incontinence 10/13/2012   Insomnia    Ischemic chest pain ([place])    Memory disorder 64/40/3474   Metabolic syndrome 25/95/6387   MGUS (monoclonal gammopathy of unknown significance) dx'd 11/2014   a. Neg BMB 11/2014.   Multiple falls    Obesity    Obstructive sleep apnea    "have mask; don't wear it" (01/07/2015)   Orthostasis    PAT (paroxysmal atrial tachycardia)    Personal history of colonic  polyps 10/25/2011 & 12/02/11   not retrieved Dr Lyla Son & tubular adenomas   Pneumonia 03/2014   Sinus bradycardia    a. Baseline HR 50s-60s.   Stroke [facility]) early 2000's   "small"; denies residual on 01/07/2015)    Past Surgical History:  Procedure Laterality Date   BACK SURGERY     BONE MARROW BIOPSY  11/2014   CARDIAC CATHETERIZATION  12/27/2014   Procedure: INTRAVASCULAR PRESSURE WIRE/FFR STUDY;  Surgeon: Larey Dresser, MD;  Location: [facility] CATH LAB;  Service: Cardiovascular;;   CARDIAC CATHETERIZATION N/A 11/25/2016   Procedure: Left Heart Cath and Coronary Angiography;  Surgeon: Sherren Mocha, MD;  Location: [place] CV LAB;  Service: Cardiovascular;  Laterality: N/A;   CARDIOVERSION N/A 02/13/2016   Procedure: CARDIOVERSION;  Surgeon: Josue Hector, MD;  Location: [place];  Service: Cardiovascular;  Laterality: N/A;   CARPAL TUNNEL RELEASE Right 1980's   CATARACT EXTRACTION Bilateral    CATARACT EXTRACTION W/ INTRAOCULAR LENS  IMPLANT, BILATERAL  2000's   CORONARY ANGIOPLASTY WITH STENT PLACEMENT  01/07/2015   "2"   CORONARY STENT PLACEMENT     DILATION AND CURETTAGE OF UTERUS     ESOPHAGOGASTRODUODENOSCOPY (EGD) WITH ESOPHAGEAL DILATION  "several times"   IR [place] W/FLUORO  09/08/2022   JOINT REPLACEMENT     KNEE ARTHROSCOPY Left 1995   LAPAROSCOPIC CHOLECYSTECTOMY  2003   LEFT HEART CATHETERIZATION WITH CORONARY ANGIOGRAM N/A 12/27/2014   Procedure: LEFT HEART CATHETERIZATION WITH CORONARY ANGIOGRAM;  Surgeon: Larey Dresser, MD;  Location: [facility] CATH LAB;  Service: Cardiovascular;  Laterality: N/A;   PERCUTANEOUS CORONARY ROTOBLATOR INTERVENTION (PCI-R)  01/07/2015   PERCUTANEOUS CORONARY ROTOBLATOR INTERVENTION (PCI-R) N/A 01/07/2015   Procedure: PERCUTANEOUS CORONARY ROTOBLATOR INTERVENTION (PCI-R);  Surgeon: Blane Ohara, MD;  Location: [facility] CATH LAB;  Service: Cardiovascular;  Laterality: N/A;   POSTERIOR FUSION THORACIC SPINE  08/2015    POSTERIOR LUMBAR FUSION  01/2015   TOTAL KNEE ARTHROPLASTY Bilateral 1990's - 2000's    Social History   Socioeconomic History   Marital status: Widowed    Spouse name: Not on file   Number of children: 2   Years of education: HS   Highest education level: Not on file  Occupational History   Occupation: Licensed conveyancer- retired    Comment: retired   Occupation: retired  Tobacco Use   Smoking status: Never   Smokeless tobacco: Never  Vaping Use   Vaping Use: Never used  Substance and Sexual Activity   Alcohol use: No    Comment: 01/07/2015 "glass of wine at Christmas, maybe"   Drug use: No   Sexual activity: Not Currently  Other Topics Concern   Not on file  Social History Narrative   Patient is right handed  Long term resident of [facility]    Social Determinants of Health   Financial Resource Strain: Not on file  Food Insecurity: Not on file  Transportation Needs: Not on file  Physical Activity: Inactive (04/04/2020)   Exercise Vital Sign    Days of Exercise per Week: 0 days    Minutes of Exercise per Session: 0 min  Stress: Stress Concern Present (04/04/2020)   [place]    Feeling of Stress : To some extent  Social Connections: Socially Isolated (04/04/2020)   Social Connection and Isolation Panel [NHANES]    Frequency of Communication with Friends and Family: Three times a week    Frequency of Social Gatherings with Friends and Family: Once a week    Attends Religious Services: Never    Marine scientist or Organizations: No    Attends Archivist Meetings: Never    Marital Status: Widowed  Human resources officer Violence: Not on file   Family History  Problem Relation Age of Onset   Coronary artery disease Father    Peripheral vascular disease Father    Coronary artery disease Mother    Coronary artery disease Brother    Colon cancer Sister    Emphysema Sister    Sleep apnea Son    Colon  cancer Sister        spread to her brain   Emphysema Brother    Dementia Neg Hx       VITAL SIGNS BP 118/65   Pulse 86   Temp 98 F (36.7 C)   Resp 19   Ht _0  (1.702 m)   Wt 170 lb (77.1 kg)   SpO2 94%   BMI 26.63 kg/m   Outpatient Encounter Medications as of 12/03/2022  Medication Sig   acetaminophen (TYLENOL) 325 MG tablet Take 20 mLs by mouth every 8 (eight) hours as needed for fever.   aspirin EC 81 MG tablet Take 81 mg by mouth daily. Swallow whole.   busPIRone (BUSPAR) 7.5 MG tablet Take 7.5 mg by mouth 3 (three) times daily.   Cholecalciferol 25 MCG (1000 UT) tablet Place 1,000 Units into feeding tube daily. 9 am   clobetasol (TEMOVATE) 0.05 % external solution Apply 1 application. topically 2 (two) times a week. Monday and Friday   diclofenac Sodium (VOLTAREN) 1 % GEL Apply 2 g topically 3 (three) times daily. to neck and shoulders for pain management   diltiazem (CARDIZEM) 120 MG tablet Take 120 mg by mouth every 8 (eight) hours.   ELIQUIS 5 MG TABS tablet Take 1 tablet (5 mg total) by mouth 2 (two) times daily.   eszopiclone 3 MG TABS Take 1 tablet (3 mg total) by mouth at bedtime. Take immediately before bedtime   fentaNYL (DURAGESIC) 25 MCG/HR Place 1 patch onto the skin every 3 (three) days.   FLUoxetine (PROZAC) 20 MG capsule Take 20 mg by mouth daily. Give with 40 mg to equal 60   FLUoxetine (PROZAC) 40 MG capsule Take 40 mg by mouth daily. Give with 20 mg to equal 60   furosemide (LASIX) 40 MG tablet Place 40 mg into feeding tube 2 (two) times daily. 9 am   gabapentin (NEURONTIN) 300 MG capsule Take 100 mg by mouth 3 (three) times daily.   lidocaine 4 % Place 1 patch onto the skin in the morning and at bedtime.   methocarbamol (ROBAXIN) 500 MG tablet Take 500 mg by mouth every 6 (six)  hours as needed for muscle spasms.   Miconazole Nitrate 2 % POWD by Does not apply route daily.   naloxone (NARCAN) nasal spray 4 mg/0.1 mL Special Instructions: As needed for  depressed respirations less than 8/minute. Notify provider if given. If no effectiveness or improvement, contact 911. As Needed   nitroGLYCERIN (NITROSTAT) 0.4 MG SL tablet Place 1 tablet (0.4 mg total) under the tongue every 5 (five) minutes as needed for chest pain (up to 3 doses).   Omeprazole-Sodium Bicarbonate (KONVOMEP) 2-84 MG/ML SUSR Give 10 mLs by tube in the morning and at bedtime.   potassium chloride (MICRO-K) 10 MEQ CR capsule Take 10 mEq by mouth daily.   rOPINIRole (REQUIP) 0.5 MG tablet Take 0.5 mg by mouth in the morning and at bedtime.   rosuvastatin (CRESTOR) 10 MG tablet Take 10 mg by mouth daily. 9 am   saccharomyces boulardii (FLORASTOR) 250 MG capsule Take 250 mg by mouth 2 (two) times daily.   traMADol (ULTRAM) 50 MG tablet Take 0.5 tablets (25 mg total) by mouth every 8 (eight) hours.   traMADol (ULTRAM) 50 MG tablet Take by mouth every 8 (eight) hours.   [DISCONTINUED] potassium chloride 20 MEQ/15ML (10%) SOLN Take 20 mEq by mouth daily. For potassium supplement   No facility-administered encounter medications on file as of 12/03/2022.     SIGNIFICANT DIAGNOSTIC EXAMS  PREVIOUS   02-23-22: TEE:  The left ventricular size is normal.  Mild left ventricular hypertrophy  LV ejection fraction = 45-50%.  Left ventricular systolic function is mildly reduced.  The right ventricle is normal in size and function.  The left atrium is moderately to severely dilated.  The right atrium is mildly to moderately dilated.  Estimated right ventricular systolic pressure is 27 mmHg.  IVC size was normal.  There is no significant valvular stenosis or regurgitation.  There is no pericardial effusion.  There is a pleural effusion present.  Recommend a limited TTE when HR is controlled to reassess   05-23-22: chest x-ray: mild congestive heart failure  07-14-22: chest x-ray:  Changes of bibasilar atelectasis  Nonspecific perihilar inflammatory changes Bilateral spinal fusion  hardware   07-20-22: chest x-ray:  Nonspecific perihilar changes Emphysema No improvement compared to 07-14-22.   07-21-22: chest x-ray:  No acute pulmonary process.   09-21-22: swallow study:   Recommend regular textures vs mechanical soft per treating SLP at SNF and thin liquids via cup/straw, alternate solids and liquids, implement effortful/hard swallow with solids, and Pt to sit upright for all eating and drinking and 30+ minutes after (avoid bending over as well). Pt will likely need to proceed with solid foods cautiously to see if she experiences and regurgitation. Recommend f/u with SLP services at SNF. Ok for PO medication whole with water.   NO NEW EXAMS   LABS REVIEWED: PREVIOUS   02-27-22: glucose 102; bun 13; creat 0.52; k+ 4.8; na++ 134; ca 8.9; GFR>90 04-08-22: wbc 7.2 hgb 7.2; hct 11.9; mcv 35.2 plt 185; glucose 114; bun 11; creat 0.37; k+ 4.5; na++ 132; ca 8.6; GFR >90 05-21-22: glucose 113; bun 19; creat 0.5; k+ 4.2; na++ 135; ca 8.5 gfr>60; protein 5.2; albumin 2.4  05-23-22: wbc 5.4; hgb 9.2; hct 29.6 plt 184; glucose 123; bun 21; creat 0.54; k+ 3.8; na++ 8.2; ca 8.2 gfr >60 07-01-22: glucose 91; bun 16; creat 0.44; k+ 4.1; na++ 135; ca 8.6; gfr>60 07-14-22: wbc 9.9; hgb 10.8; hct 35.3; mcv 83.8 plt 283; glucose 168; bun 24; creat  0.52; k+ 4.2; na++ 133; ca 8.6 gfr >60 urine culture:  blood culture: klebsiella pneumoniae  07-15-22: wbc 7.1; hgb 9.4; hct 31.2; mcv 85.2 plt 246; glucose 134; bun 28; creat 0.52; k+ 4.6; na++ 135; ca 8.2; gfr >60; protein 6.2 albumin 2.5; ast 50 alt 85 alk phos 283  07-19-22: blood culture: no growth  07-20-22: wbc 6.2; hgb 9.9; hct 33.2; mcv 84.9 plt 270; glucose 121; bun 24; creat 0.56; k+ 3.6; na++ 135; ca 8.5 gfr >60; protein 6.3 albumin 2.4; ast 31; alt 65; alk phos 286 07-21-22: c-dff + 07-23-22: wbc 10.6; hgb 35.1; hct 35.1; mcv 84.2 plt 244; glucose 113; bun 27; creat 0.57; k+ 4.6; na++ 135; ca 9.0; gfr >60; ast 44 alt 58; alk phos 258; protein 6.6;  albumin 2.6  NO NEW LABS.     Review of Systems  Constitutional:  Negative for malaise/fatigue.  Respiratory:  Negative for cough and shortness of breath.   Cardiovascular:  Negative for chest pain, palpitations and leg swelling.  Gastrointestinal:  Negative for abdominal pain, constipation and heartburn.  Musculoskeletal:  Positive for back pain and myalgias. Negative for joint pain.  Skin: Negative.   Neurological:  Negative for dizziness.  Psychiatric/Behavioral:  The patient is not nervous/anxious.     Physical Exam Constitutional:      General: She is not in acute distress.    Appearance: She is well-developed. She is not diaphoretic.  Neck:     Thyroid: No thyromegaly.  Cardiovascular:     Rate and Rhythm: Normal rate and regular rhythm.     Pulses: Normal pulses.     Heart sounds: Normal heart sounds.  Pulmonary:     Effort: Pulmonary effort is normal. No respiratory distress.     Breath sounds: Normal breath sounds.  Abdominal:     General: Bowel sounds are normal. There is no distension.     Palpations: Abdomen is soft.     Tenderness: There is no abdominal tenderness.     Comments: Peg tube present   Musculoskeletal:        General: Normal range of motion.     Cervical back: Neck supple.     Right lower leg: No edema.     Left lower leg: No edema.  Lymphadenopathy:     Cervical: No cervical adenopathy.  Skin:    General: Skin is warm and dry.  Neurological:     Mental Status: She is alert and oriented to person, place, and time.  Psychiatric:        Mood and Affect: Mood normal.       ASSESSMENT/ PLAN:  TODAY  Right sacral radiculopathy Chronic pain associated with significant psychosocial dysfunction   Will begin tylenol 650 mg every 8 hours and tramadol 25 mg every 8 hours.  Will monitor    Ok Edwards NP [facility]   call [phone]

## 2022-12-09 ENCOUNTER — Other Ambulatory Visit: Payer: Self-pay | Admitting: Adult Health

## 2022-12-09 MED ORDER — FENTANYL 25 MCG/HR TD PT72: 1.0000 | MEDICATED_PATCH | TRANSDERMAL | 0 refills | Status: DC

## 2022-12-09 MED ORDER — ESZOPICLONE 3 MG PO TABS: 3.0000 mg | ORAL_TABLET | Freq: Every day | ORAL | 0 refills | Status: DC

## 2022-12-16 ENCOUNTER — Other Ambulatory Visit (HOSPITAL_COMMUNITY): Payer: Self-pay | Admitting: Adult Health

## 2022-12-16 DIAGNOSIS — Z431 Encounter for attention to gastrostomy: Secondary | ICD-10-CM

## 2022-12-17 ENCOUNTER — Non-Acute Institutional Stay (SKILLED_NURSING_FACILITY): Payer: Medicare HMO | Admitting: Adult Health

## 2022-12-17 ENCOUNTER — Encounter: Payer: Self-pay | Admitting: Adult Health

## 2022-12-17 DIAGNOSIS — J069 Acute upper respiratory infection, unspecified: Secondary | ICD-10-CM | POA: Diagnosis not present

## 2022-12-17 NOTE — Progress Notes (Signed)
Location:  [facility] Room Number: 159 P Place of Service:  SNF (31) Provider:  Ok Edwards, NP  CODE STATUS: DNR  Allergies  Allergen Reactions   Buprenorphine Hcl Itching   Cymbalta [Duloxetine Hcl] Other (See Comments)    Other reaction(s): Other (See Comments) Personality changes - crying  2014   Morphine And Related Itching   Oxycodone-Acetaminophen Other (See Comments)    Personality changes    Valium [Diazepam] Other (See Comments)    Personality changes - "in another world" ;  Hallucinations  2015   Statins Other (See Comments)    Other reaction(s): Other (See Comments) Muscle weakness Muscle weakness   Morphine Itching   Altace [Ramipril] Other (See Comments)    unknown   Floxin [Ofloxacin] Other (See Comments)    unknown   Hydromorphone Other (See Comments)    Other reaction(s): Confusion (intolerance) unknown   Lovaza [Omega-3-Acid Ethyl Esters] Other (See Comments)    Muscle weakness    Trilipix [Choline Fenofibrate] Other (See Comments)    Muscle weakness    Zetia [Ezetimibe] Other (See Comments)    Muscle weakness     Chief Complaint  Patient presents with   Acute Visit    Cough    HPI:  For the past several days she has had a worsening cough with congestion. She denies any chest pain. She does have shortness of breath present. There are no reports of fevers present.   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 Abnl nuc 11/2014. Cath 12/2014 - turned down for CABG. Ultimately s/p TTVP, rotational atherectomy, PTCA and stenting of the ostial LCx and left main into the LAD (crush technique), and IVUS of the LAD/Left main. // b. Myoview  11/17: EF 48, poor quality/significant artifact; inf-lateral, inferior ischemia; Intermediate Risk   Chronic atrial fibrillation ([place])    a.  First noted post-op 9/15 spinal fusion.  She had cardioversion, not on anticoagulation. Fall risk, unsteady. // failed DCCV // Holter 10/17: AFib, Avg HR 97, PVCs, no other arrhythmia   Chronic back pain greater than 3 months duration    a. spinal stenosis.  Spinal fusion with rods in 2/15 at [facility] spinal fusion 9/15.   Chronic diastolic CHF    Echo 1/19:  EF 50-55%, trivial AI, midl MR, mod LAE, PASP 37 mmHg // Echocardiogram 03/2020: EF 45-50, no RWMA, mild LVH, mild reduced RVSF, mildly elevated PASP (RVSP 38.3), mild LAE, mild MR, mild to mod TR, trivial AI // Echo 9/21: EF 45, mild LVH, mildly reduced RV SF, moderate LAE, mild RAE, mild MR, mild AI      Circadian rhythm sleep disorder    CTS (carpal tunnel syndrome)    Deficiency anemia 11/10/2014   Depression    Diarrhea    Diverticulitis of colon    Esophageal stricture    Gastritis    Gastroesophageal hernia 07/06/2013   GERD (gastroesophageal reflux disease)    Hiatal hernia    History of blood transfusion    "most of them related to OR's"    HTN (hypertension)    Hyperlipidemia    IBS (irritable bowel syndrome)  Incontinence 10/13/2012   Insomnia    Ischemic chest pain ([place])    Memory disorder 25/04/3975   Metabolic syndrome 73/41/9379   MGUS (monoclonal gammopathy of unknown significance) dx'd 11/2014   a. Neg BMB 11/2014.   Multiple falls    Obesity    Obstructive sleep apnea    "have mask; don't wear it" (01/07/2015)   Orthostasis    PAT (paroxysmal atrial tachycardia)    Personal history of colonic polyps 10/25/2011 & 12/02/11   not retrieved Dr Lyla Son & tubular adenomas   Pneumonia 03/2014   Sinus bradycardia    a. Baseline HR 50s-60s.   Stroke [facility]) early 2000's   "small"; denies residual on 01/07/2015)    Past Surgical History:  Procedure Laterality  Date   BACK SURGERY     BONE MARROW BIOPSY  11/2014   CARDIAC CATHETERIZATION  12/27/2014   Procedure: INTRAVASCULAR PRESSURE WIRE/FFR STUDY;  Surgeon: Larey Dresser, MD;  Location: [facility] CATH LAB;  Service: Cardiovascular;;   CARDIAC CATHETERIZATION N/A 11/25/2016   Procedure: Left Heart Cath and Coronary Angiography;  Surgeon: Sherren Mocha, MD;  Location: [place] CV LAB;  Service: Cardiovascular;  Laterality: N/A;   CARDIOVERSION N/A 02/13/2016   Procedure: CARDIOVERSION;  Surgeon: Josue Hector, MD;  Location: [place];  Service: Cardiovascular;  Laterality: N/A;   CARPAL TUNNEL RELEASE Right 1980's   CATARACT EXTRACTION Bilateral    CATARACT EXTRACTION W/ INTRAOCULAR LENS  IMPLANT, BILATERAL  2000's   CORONARY ANGIOPLASTY WITH STENT PLACEMENT  01/07/2015   "2"   CORONARY STENT PLACEMENT     DILATION AND CURETTAGE OF UTERUS     ESOPHAGOGASTRODUODENOSCOPY (EGD) WITH ESOPHAGEAL DILATION  "several times"   IR [place] W/FLUORO  09/08/2022   JOINT REPLACEMENT     KNEE ARTHROSCOPY Left 1995   LAPAROSCOPIC CHOLECYSTECTOMY  2003   LEFT HEART CATHETERIZATION WITH CORONARY ANGIOGRAM N/A 12/27/2014   Procedure: LEFT HEART CATHETERIZATION WITH CORONARY ANGIOGRAM;  Surgeon: Larey Dresser, MD;  Location: [facility] CATH LAB;  Service: Cardiovascular;  Laterality: N/A;   PERCUTANEOUS CORONARY ROTOBLATOR INTERVENTION (PCI-R)  01/07/2015   PERCUTANEOUS CORONARY ROTOBLATOR INTERVENTION (PCI-R) N/A 01/07/2015   Procedure: PERCUTANEOUS CORONARY ROTOBLATOR INTERVENTION (PCI-R);  Surgeon: Blane Ohara, MD;  Location: [facility] CATH LAB;  Service: Cardiovascular;  Laterality: N/A;   POSTERIOR FUSION THORACIC SPINE  08/2015   POSTERIOR LUMBAR FUSION  01/2015   TOTAL KNEE ARTHROPLASTY Bilateral 1990's - 2000's    Social History   Socioeconomic History   Marital status: Widowed    Spouse name: Not on file   Number of children: 2   Years of education: HS   Highest education level: Not on  file  Occupational History   Occupation: Licensed conveyancer- retired    Comment: retired   Occupation: retired  Tobacco Use   Smoking status: Never   Smokeless tobacco: Never  Vaping Use   Vaping Use: Never used  Substance and Sexual Activity   Alcohol use: No    Comment: 01/07/2015 "glass of wine at Christmas, maybe"   Drug use: No   Sexual activity: Not Currently  Other Topics Concern   Not on file  Social History Narrative   Patient is right handed   Long term resident of [facility]    Social Determinants of Health   Financial Resource Strain: Not on file  Food Insecurity: Not on file  Transportation Needs: Not on file  Physical Activity: Inactive (04/04/2020)   Exercise Vital Sign  Days of Exercise per Week: 0 days    Minutes of Exercise per Session: 0 min  Stress: Stress Concern Present (04/04/2020)   [place]    Feeling of Stress : To some extent  Social Connections: Socially Isolated (04/04/2020)   Social Connection and Isolation Panel [NHANES]    Frequency of Communication with Friends and Family: Three times a week    Frequency of Social Gatherings with Friends and Family: Once a week    Attends Religious Services: Never    Marine scientist or Organizations: No    Attends Archivist Meetings: Never    Marital Status: Widowed  Human resources officer Violence: Not on file   Family History  Problem Relation Age of Onset   Coronary artery disease Father    Peripheral vascular disease Father    Coronary artery disease Mother    Coronary artery disease Brother    Colon cancer Sister    Emphysema Sister    Sleep apnea Son    Colon cancer Sister        spread to her brain   Emphysema Brother    Dementia Neg Hx       VITAL SIGNS BP 113/65   Pulse 88   Temp 97.7 F (36.5 C)   Resp 20   Ht _0  (1.702 m)   Wt 175 lb (79.4 kg)   SpO2 97%   BMI 27.41 kg/m   Outpatient Encounter Medications  as of 12/17/2022  Medication Sig   acetaminophen (TYLENOL) 325 MG tablet Take 650 mg by mouth every 8 (eight) hours.   aspirin EC 81 MG tablet Take 81 mg by mouth daily. Swallow whole.   busPIRone (BUSPAR) 7.5 MG tablet Take 7.5 mg by mouth 3 (three) times daily.   Cholecalciferol 25 MCG (1000 UT) tablet Place 1,000 Units into feeding tube daily. 9 am   clobetasol (TEMOVATE) 0.05 % external solution Apply 1 application. topically 2 (two) times a week. Monday and Friday   diclofenac Sodium (VOLTAREN) 1 % GEL Apply 2 g topically 3 (three) times daily. to neck and shoulders for pain management   diltiazem (CARDIZEM) 120 MG tablet Take 120 mg by mouth every 8 (eight) hours.   ELIQUIS 5 MG TABS tablet Take 1 tablet (5 mg total) by mouth 2 (two) times daily.   eszopiclone 3 MG TABS Take 1 tablet (3 mg total) by mouth at bedtime. Take immediately before bedtime   fentaNYL (DURAGESIC) 25 MCG/HR Place 1 patch onto the skin every 3 (three) days.   FLUoxetine (PROZAC) 20 MG capsule Take 20 mg by mouth daily. Give with 40 mg to equal 60   FLUoxetine (PROZAC) 40 MG capsule Take 40 mg by mouth daily. Give with 20 mg to equal 60   furosemide (LASIX) 40 MG tablet Place 40 mg into feeding tube 2 (two) times daily. 9 am   gabapentin (NEURONTIN) 300 MG capsule Take 100 mg by mouth 3 (three) times daily.   lidocaine 4 % Place 1 patch onto the skin in the morning and at bedtime.   methocarbamol (ROBAXIN) 500 MG tablet Take 500 mg by mouth every 6 (six) hours as needed for muscle spasms.   Miconazole Nitrate 2 % POWD by Does not apply route daily.   naloxone (NARCAN) nasal spray 4 mg/0.1 mL Special Instructions: As needed for depressed respirations less than 8/minute. Notify provider if given. If no effectiveness or improvement, contact 911.  As Needed   nitroGLYCERIN (NITROSTAT) 0.4 MG SL tablet Place 1 tablet (0.4 mg total) under the tongue every 5 (five) minutes as needed for chest pain (up to 3 doses).    Omeprazole-Sodium Bicarbonate (KONVOMEP) 2-84 MG/ML SUSR Give 10 mLs by tube in the morning and at bedtime.   potassium chloride (MICRO-K) 10 MEQ CR capsule Take 10 mEq by mouth daily.   rOPINIRole (REQUIP) 0.5 MG tablet Take 0.5 mg by mouth in the morning and at bedtime.   rosuvastatin (CRESTOR) 10 MG tablet Take 10 mg by mouth daily. 9 am   saccharomyces boulardii (FLORASTOR) 250 MG capsule Take 250 mg by mouth 2 (two) times daily.   traMADol (ULTRAM) 50 MG tablet Take 0.5 tablets (25 mg total) by mouth every 8 (eight) hours.   No facility-administered encounter medications on file as of 12/17/2022.     SIGNIFICANT DIAGNOSTIC EXAMS  PREVIOUS   02-23-22: TEE:  The left ventricular size is normal.  Mild left ventricular hypertrophy  LV ejection fraction = 45-50%.  Left ventricular systolic function is mildly reduced.  The right ventricle is normal in size and function.  The left atrium is moderately to severely dilated.  The right atrium is mildly to moderately dilated.  Estimated right ventricular systolic pressure is 27 mmHg.  IVC size was normal.  There is no significant valvular stenosis or regurgitation.  There is no pericardial effusion.  There is a pleural effusion present.  Recommend a limited TTE when HR is controlled to reassess   05-23-22: chest x-ray: mild congestive heart failure  07-14-22: chest x-ray:  Changes of bibasilar atelectasis  Nonspecific perihilar inflammatory changes Bilateral spinal fusion hardware   07-20-22: chest x-ray:  Nonspecific perihilar changes Emphysema No improvement compared to 07-14-22.   07-21-22: chest x-ray:  No acute pulmonary process.   09-21-22: swallow study:   Recommend regular textures vs mechanical soft per treating SLP at SNF and thin liquids via cup/straw, alternate solids and liquids, implement effortful/hard swallow with solids, and Pt to sit upright for all eating and drinking and 30+ minutes after (avoid bending over as  well). Pt will likely need to proceed with solid foods cautiously to see if she experiences and regurgitation. Recommend f/u with SLP services at SNF. Ok for PO medication whole with water.   NO NEW EXAMS   LABS REVIEWED: PREVIOUS   02-27-22: glucose 102; bun 13; creat 0.52; k+ 4.8; na++ 134; ca 8.9; GFR>90 04-08-22: wbc 7.2 hgb 7.2; hct 11.9; mcv 35.2 plt 185; glucose 114; bun 11; creat 0.37; k+ 4.5; na++ 132; ca 8.6; GFR >90 05-21-22: glucose 113; bun 19; creat 0.5; k+ 4.2; na++ 135; ca 8.5 gfr>60; protein 5.2; albumin 2.4  05-23-22: wbc 5.4; hgb 9.2; hct 29.6 plt 184; glucose 123; bun 21; creat 0.54; k+ 3.8; na++ 8.2; ca 8.2 gfr >60 07-01-22: glucose 91; bun 16; creat 0.44; k+ 4.1; na++ 135; ca 8.6; gfr>60 07-14-22: wbc 9.9; hgb 10.8; hct 35.3; mcv 83.8 plt 283; glucose 168; bun 24; creat 0.52; k+ 4.2; na++ 133; ca 8.6 gfr >60 urine culture:  blood culture: klebsiella pneumoniae  07-15-22: wbc 7.1; hgb 9.4; hct 31.2; mcv 85.2 plt 246; glucose 134; bun 28; creat 0.52; k+ 4.6; na++ 135; ca 8.2; gfr >60; protein 6.2 albumin 2.5; ast 50 alt 85 alk phos 283  07-19-22: blood culture: no growth  07-20-22: wbc 6.2; hgb 9.9; hct 33.2; mcv 84.9 plt 270; glucose 121; bun 24; creat 0.56; k+ 3.6; na++ 135;  ca 8.5 gfr >60; protein 6.3 albumin 2.4; ast 31; alt 65; alk phos 286 07-21-22: c-dff + 07-23-22: wbc 10.6; hgb 35.1; hct 35.1; mcv 84.2 plt 244; glucose 113; bun 27; creat 0.57; k+ 4.6; na++ 135; ca 9.0; gfr >60; ast 44 alt 58; alk phos 258; protein 6.6; albumin 2.6  NO NEW LABS.      Review of Systems  Constitutional:  Positive for malaise/fatigue.  Respiratory:  Positive for cough and shortness of breath.   Cardiovascular:  Negative for chest pain, palpitations and leg swelling.  Gastrointestinal:  Negative for abdominal pain, constipation and heartburn.  Musculoskeletal:  Negative for back pain, joint pain and myalgias.  Skin: Negative.   Neurological:  Negative for dizziness.  Psychiatric/Behavioral:  The  patient is not nervous/anxious.    Physical Exam Constitutional:      General: She is not in acute distress.    Appearance: She is well-developed. She is not diaphoretic.  Neck:     Thyroid: No thyromegaly.  Cardiovascular:     Rate and Rhythm: Normal rate and regular rhythm.     Pulses: Normal pulses.     Heart sounds: Normal heart sounds.  Pulmonary:     Effort: Pulmonary effort is normal. No respiratory distress.     Comments: Has few rhonchi with wheezing present  Abdominal:     General: Bowel sounds are normal. There is no distension.     Palpations: Abdomen is soft.     Tenderness: There is no abdominal tenderness.     Comments: Peg tube present   Musculoskeletal:        General: Normal range of motion.     Cervical back: Neck supple.     Right lower leg: No edema.     Left lower leg: No edema.  Lymphadenopathy:     Cervical: No cervical adenopathy.  Skin:    General: Skin is warm and dry.  Neurological:     Mental Status: She is alert and oriented to person, place, and time.  Psychiatric:        Mood and Affect: Mood normal.       ASSESSMENT/ PLAN:  TODAY  URI with cough and congestion: will begin duoneb every 6 weeks for one week and will monitor    Ok Edwards NP [facility]   call [phone]

## 2022-12-23 ENCOUNTER — Ambulatory Visit (HOSPITAL_COMMUNITY)
Admission: RE | Admit: 2022-12-23 | Discharge: 2022-12-23 | Disposition: A | Discharge status: Home / Self Care | Payer: Medicare HMO | Source: Ambulatory Visit | Attending: Adult Health | Admitting: Adult Health

## 2022-12-23 DIAGNOSIS — Z431 Encounter for attention to gastrostomy: Secondary | ICD-10-CM | POA: Insufficient documentation

## 2022-12-23 HISTORY — PX: IR GASTROSTOMY TUBE REMOVAL: IMG5492

## 2022-12-23 MED ORDER — LIDOCAINE VISCOUS HCL 2 % MT SOLN
OROMUCOSAL | Status: AC
  Filled 2022-12-23: qty 15

## 2022-12-28 ENCOUNTER — Non-Acute Institutional Stay: Payer: Self-pay | Admitting: Student

## 2022-12-28 ENCOUNTER — Encounter: Payer: Self-pay | Admitting: Student

## 2022-12-28 DIAGNOSIS — I5032 Chronic diastolic (congestive) heart failure: Secondary | ICD-10-CM

## 2022-12-28 DIAGNOSIS — F339 Major depressive disorder, recurrent, unspecified: Secondary | ICD-10-CM

## 2022-12-28 DIAGNOSIS — I700 Atherosclerosis of aorta: Secondary | ICD-10-CM

## 2022-12-28 DIAGNOSIS — K9429 Other complications of gastrostomy: Secondary | ICD-10-CM

## 2022-12-28 DIAGNOSIS — M159 Polyosteoarthritis, unspecified: Secondary | ICD-10-CM

## 2022-12-28 DIAGNOSIS — R600 Localized edema: Secondary | ICD-10-CM | POA: Insufficient documentation

## 2022-12-28 NOTE — Assessment & Plan Note (Signed)
Last lipid panel in 8/22 with LDL 47,  HDL 70, Trigs 81.  - Continue Crestor '10mg'$  daily

## 2022-12-28 NOTE — Assessment & Plan Note (Signed)
Much improved since removal of her G-tube.  Removal site seems to be healing well, anticipate complete resolution over the next few weeks.

## 2022-12-28 NOTE — Assessment & Plan Note (Signed)
Seen by cardiology on 12/1, LE edema thought to be largely due to immobility and venous insufficiency and not to represent progression of her chronic CHF. Last echo in 11/22, LVEF 50-55%, mild LVH, indeterminate diastolic parameters. Has been stable on '40mg'$  Lasix BID for quite some time, thought does have issues with incontinence.  Could consider addition of SGLT2i, but I worry about worsening of her incontinence and risk of skin breakdown.

## 2022-12-28 NOTE — Assessment & Plan Note (Signed)
On Prozac '60mg'$  daily. While Prozac can be associated with weight loss, her weights have been largely stable, so less concerned for deleterious side effects at this time. - Continue Prozac '60mg'$  daily

## 2022-12-28 NOTE — Progress Notes (Signed)
Location:  [facility] of Service:    Rm 159   CODE STATUS: DNR  Allergies  Allergen Reactions   Buprenorphine Hcl Itching   Cymbalta [Duloxetine Hcl] Other (See Comments)    Other reaction(s): Other (See Comments) Personality changes - crying  2014   Morphine And Related Itching   Oxycodone-Acetaminophen Other (See Comments)    Personality changes    Valium [Diazepam] Other (See Comments)    Personality changes - "in another world" ;  Hallucinations  2015   Statins Other (See Comments)    Other reaction(s): Other (See Comments) Muscle weakness Muscle weakness   Morphine Itching   Altace [Ramipril] Other (See Comments)    unknown   Floxin [Ofloxacin] Other (See Comments)    unknown   Hydromorphone Other (See Comments)    Other reaction(s): Confusion (intolerance) unknown   Lovaza [Omega-3-Acid Ethyl Esters] Other (See Comments)    Muscle weakness    Trilipix [Choline Fenofibrate] Other (See Comments)    Muscle weakness    Zetia [Ezetimibe] Other (See Comments)    Muscle weakness     No chief complaint on file.   HPI:  Multiple points of discussion today: Dysphagia Just had her G-tube removed 4 days ago.  Is very pleased with this progress.  At our last visit she was dealing with some skin breakdown around the G-tube site but is very excited to have it out and to be on the road to healing.  She has been able to take all of her medicines by mouth and has been able to eat well as well.  She notes that her weights are stable since prior to admission.  Depression "Comes and goes" feels that her current medication regimen of Prozac 60 mg daily is helpful but that her life circumstances are beyond her control.  She is especially depressed that all of her close family members who would otherwise be able to visit regularly and help her to live independently in the community are also quite sick at this time and unable to do so.  She feels that she is now  "stuck" at the present facility when she otherwise might be able to function in the community.  LE Edema Longstanding.  She does not feel that it is any worse than usual.  Was previously told that she needed to wear compression stockings and found that the pair that was brought her was far too small.  She is willing to try compression stockings if an appropriate size is to be found.  Note that she was seen by cardiology on 12/1 and discussed her LE edema.  Cardiology did not feel this was related to her chronic diastolic heart failure and rather represented venous insufficiency.  Chronic Diastolic HF As above, recently seen by cardiology.  No shortness of breath.  Did have a cough last week but this was chalked up to a an upper respiratory infection and has cleared on its own.  No worsening in her lower extremity edema.  Remained stable on Lasix 40 mg twice daily.  Reports that since moving towards twice daily dosing of Lasix instead of once daily that her incidence of wetting herself and related skin breakdown has decreased.  She is pleased with this.  R Hip Pain We discussed this briefly at our last visit.  She was recently started on gabapentin for some back pain and is found that this has been somewhat helpful for her right hip as well.  However, she notes some concern for possible daytime somnolence since starting the gabapentin.  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 Abnl nuc 11/2014. Cath 12/2014 - turned down for CABG. Ultimately s/p TTVP, rotational atherectomy, PTCA and stenting of the ostial LCx and left main into the LAD (crush technique), and IVUS of the LAD/Left main. // b. Myoview 11/17: EF  48, poor quality/significant artifact; inf-lateral, inferior ischemia; Intermediate Risk   Chronic atrial fibrillation ([place])    a.  First noted post-op 9/15 spinal fusion.  She had cardioversion, not on anticoagulation. Fall risk, unsteady. // failed DCCV // Holter 10/17: AFib, Avg HR 97, PVCs, no other arrhythmia   Chronic back pain greater than 3 months duration    a. spinal stenosis.  Spinal fusion with rods in 2/15 at [facility] spinal fusion 9/15.   Chronic diastolic CHF    Echo 2/35:  EF 50-55%, trivial AI, midl MR, mod LAE, PASP 37 mmHg // Echocardiogram 03/2020: EF 45-50, no RWMA, mild LVH, mild reduced RVSF, mildly elevated PASP (RVSP 38.3), mild LAE, mild MR, mild to mod TR, trivial AI // Echo 9/21: EF 45, mild LVH, mildly reduced RV SF, moderate LAE, mild RAE, mild MR, mild AI      Circadian rhythm sleep disorder    CTS (carpal tunnel syndrome)    Deficiency anemia 11/10/2014   Depression    Diarrhea    Diverticulitis of colon    Esophageal stricture    Gastritis    Gastroesophageal hernia 07/06/2013   GERD (gastroesophageal reflux disease)    Hiatal hernia    History of blood transfusion    "most of them related to OR's"    HTN (hypertension)    Hyperlipidemia    IBS (irritable bowel syndrome)    Incontinence 10/13/2012   Insomnia    Ischemic chest pain ([place])    Memory disorder 57/32/2025   Metabolic syndrome 42/70/6237   MGUS (monoclonal gammopathy of unknown significance) dx'd 11/2014   a. Neg BMB 11/2014.   Multiple falls    Obesity    Obstructive sleep apnea    "have mask; don't wear it" (01/07/2015)   Orthostasis    PAT (paroxysmal atrial tachycardia)    Personal history of colonic polyps 10/25/2011 & 12/02/11   not retrieved Dr Lyla Son & tubular adenomas   Pneumonia 03/2014   Sinus bradycardia    a. Baseline HR 50s-60s.   Stroke [facility]) early 2000's   "small"; denies residual on 01/07/2015)    Past Surgical History:  Procedure Laterality Date    BACK SURGERY     BONE MARROW BIOPSY  11/2014   CARDIAC CATHETERIZATION  12/27/2014   Procedure: INTRAVASCULAR PRESSURE WIRE/FFR STUDY;  Surgeon: Larey Dresser, MD;  Location: [facility] CATH LAB;  Service: Cardiovascular;;   CARDIAC CATHETERIZATION N/A 11/25/2016   Procedure: Left Heart Cath and Coronary Angiography;  Surgeon: Sherren Mocha, MD;  Location: [place] CV LAB;  Service: Cardiovascular;  Laterality: N/A;   CARDIOVERSION N/A 02/13/2016   Procedure: CARDIOVERSION;  Surgeon: Josue Hector, MD;  Location: [facility] ENDOSCOPY;  Service: Cardiovascular;  Laterality: N/A;   CARPAL TUNNEL RELEASE Right 1980's  CATARACT EXTRACTION Bilateral    CATARACT EXTRACTION W/ INTRAOCULAR LENS  IMPLANT, BILATERAL  2000's   CORONARY ANGIOPLASTY WITH STENT PLACEMENT  01/07/2015   "2"   CORONARY STENT PLACEMENT     DILATION AND CURETTAGE OF UTERUS     ESOPHAGOGASTRODUODENOSCOPY (EGD) WITH ESOPHAGEAL DILATION  "several times"   IR GASTROSTOMY TUBE REMOVAL  12/23/2022   IR [place] GASTRO/COLONIC TUBE PERCUT W/FLUORO  09/08/2022   JOINT REPLACEMENT     KNEE ARTHROSCOPY Left 1995   LAPAROSCOPIC CHOLECYSTECTOMY  2003   LEFT HEART CATHETERIZATION WITH CORONARY ANGIOGRAM N/A 12/27/2014   Procedure: LEFT HEART CATHETERIZATION WITH CORONARY ANGIOGRAM;  Surgeon: Larey Dresser, MD;  Location: [facility] CATH LAB;  Service: Cardiovascular;  Laterality: N/A;   PERCUTANEOUS CORONARY ROTOBLATOR INTERVENTION (PCI-R)  01/07/2015   PERCUTANEOUS CORONARY ROTOBLATOR INTERVENTION (PCI-R) N/A 01/07/2015   Procedure: PERCUTANEOUS CORONARY ROTOBLATOR INTERVENTION (PCI-R);  Surgeon: Blane Ohara, MD;  Location: [facility] CATH LAB;  Service: Cardiovascular;  Laterality: N/A;   POSTERIOR FUSION THORACIC SPINE  08/2015   POSTERIOR LUMBAR FUSION  01/2015   TOTAL KNEE ARTHROPLASTY Bilateral 1990's - 2000's    Social History   Socioeconomic History   Marital status: Widowed    Spouse name: Not on file   Number of children: 2   Years of education: HS    Highest education level: Not on file  Occupational History   Occupation: Licensed conveyancer- retired    Comment: retired   Occupation: retired  Tobacco Use   Smoking status: Never   Smokeless tobacco: Never  Vaping Use   Vaping Use: Never used  Substance and Sexual Activity   Alcohol use: No    Comment: 01/07/2015 "glass of wine at Christmas, maybe"   Drug use: No   Sexual activity: Not Currently  Other Topics Concern   Not on file  Social History Narrative   Patient is right handed   Long term resident of [facility]    Social Determinants of Health   Financial Resource Strain: Not on file  Food Insecurity: Not on file  Transportation Needs: Not on file  Physical Activity: Inactive (04/04/2020)   Exercise Vital Sign    Days of Exercise per Week: 0 days    Minutes of Exercise per Session: 0 min  Stress: Stress Concern Present (04/04/2020)   [place]    Feeling of Stress : To some extent  Social Connections: Socially Isolated (04/04/2020)   Social Connection and Isolation Panel [NHANES]    Frequency of Communication with Friends and Family: Three times a week    Frequency of Social Gatherings with Friends and Family: Once a week    Attends Religious Services: Never    Marine scientist or Organizations: No    Attends Archivist Meetings: Never    Marital Status: Widowed  Human resources officer Violence: Not on file   Family History  Problem Relation Age of Onset   Coronary artery disease Father    Peripheral vascular disease Father    Coronary artery disease Mother    Coronary artery disease Brother    Colon cancer Sister    Emphysema Sister    Sleep apnea Son    Colon cancer Sister        spread to her brain   Emphysema Brother    Dementia Neg Hx       VITAL SIGNS There were no vitals taken for this visit.  Outpatient Encounter Medications as  of 12/28/2022  Medication Sig   acetaminophen (TYLENOL)  325 MG tablet Take 650 mg by mouth every 8 (eight) hours.   aspirin EC 81 MG tablet Take 81 mg by mouth daily. Swallow whole.   busPIRone (BUSPAR) 7.5 MG tablet Take 7.5 mg by mouth 3 (three) times daily.   Cholecalciferol 25 MCG (1000 UT) tablet Place 1,000 Units into feeding tube daily. 9 am   clobetasol (TEMOVATE) 0.05 % external solution Apply 1 application. topically 2 (two) times a week. Monday and Friday   diclofenac Sodium (VOLTAREN) 1 % GEL Apply 2 g topically 3 (three) times daily. to neck and shoulders for pain management   diltiazem (CARDIZEM) 120 MG tablet Take 120 mg by mouth every 8 (eight) hours.   ELIQUIS 5 MG TABS tablet Take 1 tablet (5 mg total) by mouth 2 (two) times daily.   eszopiclone 3 MG TABS Take 1 tablet (3 mg total) by mouth at bedtime. Take immediately before bedtime   fentaNYL (DURAGESIC) 25 MCG/HR Place 1 patch onto the skin every 3 (three) days.   FLUoxetine (PROZAC) 20 MG capsule Take 20 mg by mouth daily. Give with 40 mg to equal 60   FLUoxetine (PROZAC) 40 MG capsule Take 40 mg by mouth daily. Give with 20 mg to equal 60   furosemide (LASIX) 40 MG tablet Place 40 mg into feeding tube 2 (two) times daily. 9 am   gabapentin (NEURONTIN) 300 MG capsule Take 100 mg by mouth 3 (three) times daily.   lidocaine 4 % Place 1 patch onto the skin in the morning and at bedtime.   methocarbamol (ROBAXIN) 500 MG tablet Take 500 mg by mouth every 6 (six) hours as needed for muscle spasms.   Miconazole Nitrate 2 % POWD by Does not apply route daily.   naloxone (NARCAN) nasal spray 4 mg/0.1 mL Special Instructions: As needed for depressed respirations less than 8/minute. Notify provider if given. If no effectiveness or improvement, contact 911. As Needed   nitroGLYCERIN (NITROSTAT) 0.4 MG SL tablet Place 1 tablet (0.4 mg total) under the tongue every 5 (five) minutes as needed for chest pain (up to 3 doses).   Omeprazole-Sodium Bicarbonate (KONVOMEP) 2-84 MG/ML SUSR Give 10 mLs  by tube in the morning and at bedtime.   potassium chloride (MICRO-K) 10 MEQ CR capsule Take 10 mEq by mouth daily.   rOPINIRole (REQUIP) 0.5 MG tablet Take 0.5 mg by mouth in the morning and at bedtime.   rosuvastatin (CRESTOR) 10 MG tablet Take 10 mg by mouth daily. 9 am   saccharomyces boulardii (FLORASTOR) 250 MG capsule Take 250 mg by mouth 2 (two) times daily.   traMADol (ULTRAM) 50 MG tablet Take 0.5 tablets (25 mg total) by mouth every 8 (eight) hours.   No facility-administered encounter medications on file as of 12/28/2022.     Physical Exam Constitutional:      Comments: Elderly, NAD, generally in good spirits  HENT:     Mouth/Throat:     Mouth: Mucous membranes are moist.  Cardiovascular:     Pulses: Normal pulses.     Heart sounds: No murmur heard. Pulmonary:     Effort: Pulmonary effort is normal.     Breath sounds: No wheezing, rhonchi or rales.  Abdominal:     Comments: G-tube site appears to be healing well, dressing clean and dry. No drainage or surrounding skin breakdown appreciated.   Musculoskeletal:     Comments: 2+ Pitting edema to BLE, symmetric.  Overlying skin changes to bilateral shins c/w hemosiderin deposition from chronic venous stasis  Psychiatric:     Comments: Intermittently tearful when discussing her family's hardships/illnesses.        ASSESSMENT/ PLAN:  Chronic diastolic CHF (congestive heart failure) ([place]) Seen by cardiology on 12/1, LE edema thought to be largely due to immobility and venous insufficiency and not to represent progression of her chronic CHF. Last echo in 11/22, LVEF 50-55%, mild LVH, indeterminate diastolic parameters. Has been stable on '40mg'$  Lasix BID for quite some time, thought does have issues with incontinence.  Could consider addition of SGLT2i, but I worry about worsening of her incontinence and risk of skin breakdown.   Major depression, recurrent, chronic (HCC) On Prozac '60mg'$  daily. While Prozac can be associated  with weight loss, her weights have been largely stable, so less concerned for deleterious side effects at this time. - Continue Prozac '60mg'$  daily  Aortic atherosclerosis (HCC) Last lipid panel in 8/22 with LDL 47,  HDL 70, Trigs 81.  - Continue Crestor '10mg'$  daily  Skin breakdown at gastrostomy tube site [facility]) Much improved since removal of her G-tube.  Removal site seems to be healing well, anticipate complete resolution over the next few weeks.  Generalized osteoarthritis Right-sided hip pain.  Moderate response to gabapentin, she is pleased with this, however I do question whether this may be contributing to her excessive daytime somnolence. -Would recommend dosing gabapentin once daily in the evenings to see if it can treat her pain and perhaps help with her insomnia.  Ideally would get someone of her age off of Johnnye Sima if at all possible. -Recommend assessment and treatment with physical therapy.  Bilateral lower extremity edema Symmetric.  Not thought to be related to her CHF.  Most likely just related to inactivity and venous insufficiency evidenced by the overlying skin changes consistent with hemosiderin deposition. -Recommend bilateral compression stockings.  Patient amenable.     Pearla Dubonnet, MD PGY-2 [place]

## 2022-12-28 NOTE — Assessment & Plan Note (Addendum)
Right-sided hip pain.  Moderate response to gabapentin, she is pleased with this, however I do question whether this may be contributing to her excessive daytime somnolence. -Would recommend dosing gabapentin once daily in the evenings to see if it can treat her pain and perhaps help with her insomnia.  Ideally would get someone of her age off of [name] [name] if at all possible. -Recommend assessment and treatment with physical therapy.

## 2022-12-28 NOTE — Assessment & Plan Note (Signed)
Symmetric.  Not thought to be related to her CHF.  Most likely just related to inactivity and venous insufficiency evidenced by the overlying skin changes consistent with hemosiderin deposition. -Recommend bilateral compression stockings.  Patient amenable.

## 2023-01-01 ENCOUNTER — Other Ambulatory Visit: Payer: Self-pay | Admitting: Adult Health

## 2023-01-01 MED ORDER — FENTANYL 25 MCG/HR TD PT72: 1.0000 | MEDICATED_PATCH | TRANSDERMAL | 0 refills | Status: DC

## 2023-01-01 MED ORDER — ESZOPICLONE 3 MG PO TABS: 3.0000 mg | ORAL_TABLET | Freq: Every day | ORAL | 0 refills | Status: DC

## 2023-01-01 MED ORDER — TRAMADOL HCL 50 MG PO TABS: 25.0000 mg | ORAL_TABLET | Freq: Three times a day (TID) | ORAL | 0 refills | Status: DC

## 2023-01-08 ENCOUNTER — Encounter: Payer: Self-pay | Admitting: Adult Health

## 2023-01-08 ENCOUNTER — Non-Acute Institutional Stay (SKILLED_NURSING_FACILITY): Payer: Medicare HMO | Admitting: Adult Health

## 2023-01-08 DIAGNOSIS — F339 Major depressive disorder, recurrent, unspecified: Secondary | ICD-10-CM | POA: Diagnosis not present

## 2023-01-08 DIAGNOSIS — I5032 Chronic diastolic (congestive) heart failure: Secondary | ICD-10-CM

## 2023-01-08 DIAGNOSIS — I700 Atherosclerosis of aorta: Secondary | ICD-10-CM

## 2023-01-08 NOTE — Progress Notes (Unsigned)
Location:  [place] Room Number: NO/159/P Place of Service:  SNF (31) Ok Edwards S.,NP CODE STATUS: DNR  Allergies  Allergen Reactions   Buprenorphine Hcl Itching   Cymbalta [Duloxetine Hcl] Other (See Comments)    Other reaction(s): Other (See Comments) Personality changes - crying  2014   Morphine And Related Itching   Oxycodone-Acetaminophen Other (See Comments)    Personality changes    Valium [Diazepam] Other (See Comments)    Personality changes - "in another world" ;  Hallucinations  2015   Statins Other (See Comments)    Other reaction(s): Other (See Comments) Muscle weakness Muscle weakness   Morphine Itching   Altace [Ramipril] Other (See Comments)    unknown   Floxin [Ofloxacin] Other (See Comments)    unknown   Hydromorphone Other (See Comments)    Other reaction(s): Confusion (intolerance) unknown   Lovaza [Omega-3-Acid Ethyl Esters] Other (See Comments)    Muscle weakness    Trilipix [Choline Fenofibrate] Other (See Comments)    Muscle weakness    Zetia [Ezetimibe] Other (See Comments)    Muscle weakness     Chief Complaint  Patient presents with   Acute Visit    Patient is being seen for plan care meeting    HPI:  We have come together for her care plan meeting. Family present. BIMS 13/15 mood 7/30: decreased energy; nervous; depressed at times. She is ambulatory with no falls. She requires supervision with her adls. She is frequently incontinent of bladder and bowel. She has had her peg tube removed. Dietary: weight is 172 pounds; regular diet appetite 50-100% feeds self. Therapy: none at this time. Activities: does not attend.  We have had a prolonged discussion regarding how the MDS is filled out; what is looked at and assessed. Her is too independent with her adls to be listed as need skilled nursing. Her family has been instructed that other alternative placement options should be considered for her future care. Her family  will be in contact with social services.  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 Abnl nuc 11/2014. Cath 12/2014 - turned down for CABG. Ultimately s/p TTVP, rotational atherectomy, PTCA and stenting of the ostial LCx and left main into the LAD (crush technique), and IVUS of the LAD/Left main. // b. Myoview 11/17: EF 48, poor quality/significant artifact; inf-lateral, inferior ischemia; Intermediate Risk   Chronic atrial fibrillation ([place])    a.  First noted post-op 9/15 spinal fusion.  She had cardioversion, not on anticoagulation. Fall risk, unsteady. // failed DCCV // Holter 10/17: AFib, Avg HR 97, PVCs, no other arrhythmia   Chronic back pain greater than 3 months duration    a. spinal stenosis.  Spinal fusion with rods in 2/15 at [facility] spinal fusion 9/15.   Chronic diastolic CHF    Echo 5/36:  EF 50-55%, trivial AI, midl MR, mod LAE, PASP 37 mmHg // Echocardiogram 03/2020: EF 45-50, no RWMA, mild LVH, mild reduced RVSF, mildly elevated PASP (RVSP 38.3), mild LAE,  mild MR, mild to mod TR, trivial AI // Echo 9/21: EF 45, mild LVH, mildly reduced RV SF, moderate LAE, mild RAE, mild MR, mild AI      Circadian rhythm sleep disorder    CTS (carpal tunnel syndrome)    Deficiency anemia 11/10/2014   Depression    Diarrhea    Diverticulitis of colon    Esophageal stricture    Gastritis    Gastroesophageal hernia 07/06/2013   GERD (gastroesophageal reflux disease)    Hiatal hernia    History of blood transfusion     "most of them related to OR's"    HTN (hypertension)    Hyperlipidemia    IBS (irritable bowel syndrome)    Incontinence 10/13/2012   Insomnia    Ischemic chest pain ([place])    Memory disorder 78/29/5621   Metabolic syndrome 30/86/5784   MGUS (monoclonal gammopathy of unknown significance) dx'd 11/2014   a. Neg BMB 11/2014.   Multiple falls    Obesity    Obstructive sleep apnea    "have mask; don't wear it" (01/07/2015)   Orthostasis    PAT (paroxysmal atrial tachycardia)    Personal history of colonic polyps 10/25/2011 & 12/02/11   not retrieved Dr Lyla Son & tubular adenomas   Pneumonia 03/2014   Sinus bradycardia    a. Baseline HR 50s-60s.   Stroke [facility]) early 2000's   "small"; denies residual on 01/07/2015)    Past Surgical History:  Procedure Laterality Date   BACK SURGERY     BONE MARROW BIOPSY  11/2014   CARDIAC CATHETERIZATION  12/27/2014   Procedure: INTRAVASCULAR PRESSURE WIRE/FFR STUDY;  Surgeon: Larey Dresser, MD;  Location: [facility] CATH LAB;  Service: Cardiovascular;;   CARDIAC CATHETERIZATION N/A 11/25/2016   Procedure: Left Heart Cath and Coronary Angiography;  Surgeon: Sherren Mocha, MD;  Location: [place] CV LAB;  Service: Cardiovascular;  Laterality: N/A;   CARDIOVERSION N/A 02/13/2016   Procedure: CARDIOVERSION;  Surgeon: Josue Hector, MD;  Location: [place];  Service: Cardiovascular;  Laterality: N/A;   CARPAL TUNNEL RELEASE Right 1980's   CATARACT EXTRACTION Bilateral    CATARACT EXTRACTION W/ INTRAOCULAR LENS  IMPLANT, BILATERAL  2000's   CORONARY ANGIOPLASTY WITH STENT PLACEMENT  01/07/2015   "2"   CORONARY STENT PLACEMENT     DILATION AND CURETTAGE OF UTERUS     ESOPHAGOGASTRODUODENOSCOPY (EGD) WITH ESOPHAGEAL DILATION  "several times"   IR GASTROSTOMY TUBE REMOVAL  12/23/2022   IR [place] GASTRO/COLONIC TUBE PERCUT W/FLUORO  09/08/2022   JOINT REPLACEMENT     KNEE ARTHROSCOPY Left 1995   LAPAROSCOPIC CHOLECYSTECTOMY  2003   LEFT HEART  CATHETERIZATION WITH CORONARY ANGIOGRAM N/A 12/27/2014   Procedure: LEFT HEART CATHETERIZATION WITH CORONARY ANGIOGRAM;  Surgeon: Larey Dresser, MD;  Location[facility] CATH LAB;  Service: Cardiovascular;  Laterality: N/A;   PERCUTANEOUS CORONARY ROTOBLATOR INTERVENTION (PCI-R)  01/07/2015   PERCUTANEOUS CORONARY ROTOBLATOR INTERVENTION (PCI-R) N/A 01/07/2015   Procedure: PERCUTANEOUS CORONARY ROTOBLATOR INTERVENTION (PCI-R);  Surgeon: Blane Ohara, MD;  Location: [facility] CATH LAB;  Service: Cardiovascular;  Laterality: N/A;   POSTERIOR FUSION THORACIC SPINE  08/2015   POSTERIOR LUMBAR FUSION  01/2015   TOTAL KNEE ARTHROPLASTY Bilateral 1990's - 2000's    Social History   Socioeconomic History   Marital status: Widowed    Spouse name: Not on file   Number of children: 2   Years of education: HS   Highest education level: Not on file  Occupational History  Occupation: Licensed conveyancer- retired    Comment: retired   Occupation: retired  Tobacco Use   Smoking status: Never   Smokeless tobacco: Never  Vaping Use   Vaping Use: Never used  Substance and Sexual Activity   Alcohol use: No    Comment: 01/07/2015 "glass of wine at Christmas, maybe"   Drug use: No   Sexual activity: Not Currently  Other Topics Concern   Not on file  Social History Narrative   Patient is right handed   Long term resident of [facility]    Social Determinants of Health   Financial Resource Strain: Not on file  Food Insecurity: Not on file  Transportation Needs: Not on file  Physical Activity: Inactive (04/04/2020)   Exercise Vital Sign    Days of Exercise per Week: 0 days    Minutes of Exercise per Session: 0 min  Stress: Stress Concern Present (04/04/2020)   [place]    Feeling of Stress : To some extent  Social Connections: Socially Isolated (04/04/2020)   Social Connection and Isolation Panel [NHANES]    Frequency of Communication with Friends and  Family: Three times a week    Frequency of Social Gatherings with Friends and Family: Once a week    Attends Religious Services: Never    Marine scientist or Organizations: No    Attends Archivist Meetings: Never    Marital Status: Widowed  Human resources officer Violence: Not on file   Family History  Problem Relation Age of Onset   Coronary artery disease Father    Peripheral vascular disease Father    Coronary artery disease Mother    Coronary artery disease Brother    Colon cancer Sister    Emphysema Sister    Sleep apnea Son    Colon cancer Sister        spread to her brain   Emphysema Brother    Dementia Neg Hx       VITAL SIGNS BP 134/63   Pulse 81   Temp (!) 97.2 F (36.2 C)   Resp (!) 22   Ht '5\' 7"'$  (1.702 m)   Wt 172 lb 6.4 oz (78.2 kg)   SpO2 99%   BMI 27.00 kg/m   Outpatient Encounter Medications as of 01/08/2023  Medication Sig   acetaminophen (TYLENOL) 325 MG tablet Take 650 mg by mouth every 8 (eight) hours.   aspirin EC 81 MG tablet Take 81 mg by mouth daily. Swallow whole.   busPIRone (BUSPAR) 7.5 MG tablet Take 7.5 mg by mouth 3 (three) times daily.   Cholecalciferol 25 MCG (1000 UT) tablet Place 1,000 Units into feeding tube daily. 9 am   clobetasol (TEMOVATE) 0.05 % external solution Apply 1 application. topically 2 (two) times a week. Monday and Friday   diclofenac Sodium (VOLTAREN) 1 % GEL Apply 2 g topically 3 (three) times daily. to neck and shoulders for pain management   diltiazem (CARDIZEM) 120 MG tablet Take 120 mg by mouth every 8 (eight) hours.   ELIQUIS 5 MG TABS tablet Take 1 tablet (5 mg total) by mouth 2 (two) times daily.   eszopiclone 3 MG TABS Take 1 tablet (3 mg total) by mouth at bedtime. Take immediately before bedtime   fentaNYL (DURAGESIC) 25 MCG/HR Place 1 patch onto the skin every 3 (three) days.   FLUoxetine (PROZAC) 20 MG capsule Take 20 mg by mouth daily. Give with 40 mg to equal 60  FLUoxetine (PROZAC) 40 MG  capsule Take 40 mg by mouth daily. Give with 20 mg to equal 60   furosemide (LASIX) 40 MG tablet Place 40 mg into feeding tube 2 (two) times daily. 9 am   gabapentin (NEURONTIN) 300 MG capsule Take 100 mg by mouth 3 (three) times daily.   lidocaine 4 % Place 1 patch onto the skin in the morning and at bedtime.   methocarbamol (ROBAXIN) 500 MG tablet Take 500 mg by mouth every 6 (six) hours as needed for muscle spasms.   Miconazole Nitrate 2 % POWD by Does not apply route daily.   naloxone (NARCAN) nasal spray 4 mg/0.1 mL Special Instructions: As needed for depressed respirations less than 8/minute. Notify provider if given. If no effectiveness or improvement, contact 911. As Needed   nitroGLYCERIN (NITROSTAT) 0.4 MG SL tablet Place 1 tablet (0.4 mg total) under the tongue every 5 (five) minutes as needed for chest pain (up to 3 doses).   Omeprazole-Sodium Bicarbonate (KONVOMEP) 2-84 MG/ML SUSR Give 10 mLs by tube in the morning and at bedtime.   potassium chloride (MICRO-K) 10 MEQ CR capsule Take 10 mEq by mouth daily.   rOPINIRole (REQUIP) 0.5 MG tablet Take 0.5 mg by mouth in the morning and at bedtime.   rosuvastatin (CRESTOR) 10 MG tablet Take 10 mg by mouth daily. 9 am   saccharomyces boulardii (FLORASTOR) 250 MG capsule Take 250 mg by mouth 2 (two) times daily.   traMADol (ULTRAM) 50 MG tablet Take 0.5 tablets (25 mg total) by mouth every 8 (eight) hours.   No facility-administered encounter medications on file as of 01/08/2023.     SIGNIFICANT DIAGNOSTIC EXAMS  PREVIOUS   02-23-22: TEE:  The left ventricular size is normal.  Mild left ventricular hypertrophy  LV ejection fraction = 45-50%.  Left ventricular systolic function is mildly reduced.  The right ventricle is normal in size and function.  The left atrium is moderately to severely dilated.  The right atrium is mildly to moderately dilated.  Estimated right ventricular systolic pressure is 27 mmHg.  IVC size was normal.   There is no significant valvular stenosis or regurgitation.  There is no pericardial effusion.  There is a pleural effusion present.  Recommend a limited TTE when HR is controlled to reassess   05-23-22: chest x-ray: mild congestive heart failure  07-14-22: chest x-ray:  Changes of bibasilar atelectasis  Nonspecific perihilar inflammatory changes Bilateral spinal fusion hardware   07-20-22: chest x-ray:  Nonspecific perihilar changes Emphysema No improvement compared to 07-14-22.   07-21-22: chest x-ray:  No acute pulmonary process.   09-21-22: swallow study:   Recommend regular textures vs mechanical soft per treating SLP at SNF and thin liquids via cup/straw, alternate solids and liquids, implement effortful/hard swallow with solids, and Pt to sit upright for all eating and drinking and 30+ minutes after (avoid bending over as well). Pt will likely need to proceed with solid foods cautiously to see if she experiences and regurgitation. Recommend f/u with SLP services at SNF. Ok for PO medication whole with water.   NO NEW EXAMS   LABS REVIEWED: PREVIOUS   02-27-22: glucose 102; bun 13; creat 0.52; k+ 4.8; na++ 134; ca 8.9; GFR>90 04-08-22: wbc 7.2 hgb 7.2; hct 11.9; mcv 35.2 plt 185; glucose 114; bun 11; creat 0.37; k+ 4.5; na++ 132; ca 8.6; GFR >90 05-21-22: glucose 113; bun 19; creat 0.5; k+ 4.2; na++ 135; ca 8.5 gfr>60; protein 5.2; albumin 2.4  05-23-22: wbc 5.4; hgb 9.2; hct 29.6 plt 184; glucose 123; bun 21; creat 0.54; k+ 3.8; na++ 8.2; ca 8.2 gfr >60 07-01-22: glucose 91; bun 16; creat 0.44; k+ 4.1; na++ 135; ca 8.6; gfr>60 07-14-22: wbc 9.9; hgb 10.8; hct 35.3; mcv 83.8 plt 283; glucose 168; bun 24; creat 0.52; k+ 4.2; na++ 133; ca 8.6 gfr >60 urine culture:  blood culture: klebsiella pneumoniae  07-15-22: wbc 7.1; hgb 9.4; hct 31.2; mcv 85.2 plt 246; glucose 134; bun 28; creat 0.52; k+ 4.6; na++ 135; ca 8.2; gfr >60; protein 6.2 albumin 2.5; ast 50 alt 85 alk phos 283  07-19-22: blood  culture: no growth  07-20-22: wbc 6.2; hgb 9.9; hct 33.2; mcv 84.9 plt 270; glucose 121; bun 24; creat 0.56; k+ 3.6; na++ 135; ca 8.5 gfr >60; protein 6.3 albumin 2.4; ast 31; alt 65; alk phos 286 07-21-22: c-dff + 07-23-22: wbc 10.6; hgb 35.1; hct 35.1; mcv 84.2 plt 244; glucose 113; bun 27; creat 0.57; k+ 4.6; na++ 135; ca 9.0; gfr >60; ast 44 alt 58; alk phos 258; protein 6.6; albumin 2.6  NO NEW LABS.     Review of Systems  Constitutional:  Negative for malaise/fatigue.  Respiratory:  Negative for cough and shortness of breath.   Cardiovascular:  Negative for chest pain, palpitations and leg swelling.  Gastrointestinal:  Negative for abdominal pain, constipation and heartburn.  Musculoskeletal:  Negative for back pain, joint pain and myalgias.  Skin: Negative.   Neurological:  Negative for dizziness.  Psychiatric/Behavioral:  Positive for depression. Negative for suicidal ideas. The patient is nervous/anxious. The patient does not have insomnia.     Physical Exam Constitutional:      General: She is not in acute distress.    Appearance: She is well-developed. She is not diaphoretic.  Neck:     Thyroid: No thyromegaly.  Cardiovascular:     Rate and Rhythm: Normal rate and regular rhythm.     Pulses: Normal pulses.     Heart sounds: Normal heart sounds.  Pulmonary:     Effort: Pulmonary effort is normal. No respiratory distress.     Breath sounds: Normal breath sounds.  Abdominal:     General: Bowel sounds are normal. There is no distension.     Palpations: Abdomen is soft.     Tenderness: There is no abdominal tenderness.  Musculoskeletal:        General: Normal range of motion.     Cervical back: Neck supple.     Right lower leg: No edema.     Left lower leg: No edema.  Lymphadenopathy:     Cervical: No cervical adenopathy.  Skin:    General: Skin is warm and dry.  Neurological:     Mental Status: She is alert and oriented to person, place, and time.  Psychiatric:         Mood and Affect: Mood normal.       ASSESSMENT/ PLAN:  TODAY  Aortic atherosclerosis Chronic diastolic CHF (congestive heart failure) Major depression chronic recurrent  At this time her family is going to discuss options other than SNF. Will speak further with social services Will continue current medications Will continue to monitor her status.   Time spent with patient 90 minutes: future care.     Ok Edwards NP [facility]  call [phone]

## 2023-01-14 ENCOUNTER — Encounter: Payer: Self-pay | Admitting: Adult Health

## 2023-01-18 ENCOUNTER — Non-Acute Institutional Stay (SKILLED_NURSING_FACILITY): Payer: Medicare HMO | Admitting: Adult Health

## 2023-01-18 ENCOUNTER — Encounter: Payer: Self-pay | Admitting: Adult Health

## 2023-01-18 DIAGNOSIS — E43 Unspecified severe protein-calorie malnutrition: Secondary | ICD-10-CM

## 2023-01-18 DIAGNOSIS — I700 Atherosclerosis of aorta: Secondary | ICD-10-CM | POA: Diagnosis not present

## 2023-01-18 DIAGNOSIS — G894 Chronic pain syndrome: Secondary | ICD-10-CM | POA: Diagnosis not present

## 2023-01-18 DIAGNOSIS — D500 Iron deficiency anemia secondary to blood loss (chronic): Secondary | ICD-10-CM

## 2023-01-18 NOTE — Progress Notes (Unsigned)
Location:  [facility] Room Number: 299M Place of Service:  SNF (31)   CODE STATUS: DNR  Allergies  Allergen Reactions  . Buprenorphine Hcl Itching  . Cymbalta [Duloxetine Hcl] Other (See Comments)    Other reaction(s): Other (See Comments) Personality changes - crying  2014  . Morphine And Related Itching  . Oxycodone-Acetaminophen Other (See Comments)    Personality changes   . Valium [Diazepam] Other (See Comments)    Personality changes - "in another world" ;  Hallucinations  2015  . Statins Other (See Comments)    Other reaction(s): Other (See Comments) Muscle weakness Muscle weakness  . Morphine Itching  . Altace [Ramipril] Other (See Comments)    unknown  . Floxin [Ofloxacin] Other (See Comments)    unknown  . Hydromorphone Other (See Comments)    Other reaction(s): Confusion (intolerance) unknown  . Lovaza [Omega-3-Acid Ethyl Esters] Other (See Comments)    Muscle weakness   . Trilipix [Choline Fenofibrate] Other (See Comments)    Muscle weakness   . Zetia [Ezetimibe] Other (See Comments)    Muscle weakness     Chief Complaint  Patient presents with  . Medical Management of Chronic Issues                             ***    HPI:    Past Medical History:  Diagnosis Date  . Abnormal nuclear cardiac imaging test   . Anemia, iron deficiency 07/02/2015  . Anxiety   . Arthritis    "knees, back, fingers, toes; joints" (01/07/2015)  . 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 C. difficile colitis 07/21/2022  . CAD (coronary artery disease)    a. Abnl nuc 11/2014. Cath 12/2014 - turned down for CABG. Ultimately s/p TTVP, rotational atherectomy, PTCA and stenting of the ostial LCx and left main into the LAD (crush technique), and IVUS of the LAD/Left main. // b. Myoview 11/17: EF 48, poor quality/significant artifact; inf-lateral, inferior ischemia; Intermediate Risk  .  Chronic atrial fibrillation ([place])    a.  First noted post-op 9/15 spinal fusion.  She had cardioversion, not on anticoagulation. Fall risk, unsteady. // failed DCCV // Holter 10/17: AFib, Avg HR 97, PVCs, no other arrhythmia  . Chronic back pain greater than 3 months duration    a. spinal stenosis.  Spinal fusion with rods in 2/15 at [facility] spinal fusion 9/15.  Marland Kitchen Chronic diastolic CHF    Echo 4/26:  EF 50-55%, trivial AI, midl MR, mod LAE, PASP 37 mmHg // Echocardiogram 03/2020: EF 45-50, no RWMA, mild LVH, mild reduced RVSF, mildly elevated PASP (RVSP 38.3), mild LAE, mild MR, mild to mod TR, trivial AI // Echo 9/21: EF 45, mild LVH, mildly reduced RV SF, moderate LAE, mild RAE, mild MR, mild AI     . Circadian rhythm sleep disorder   . CTS (carpal tunnel syndrome)   . Deficiency anemia 11/10/2014  . Depression   . Diarrhea   . Diverticulitis of colon   . Esophageal stricture   . Gastritis   . Gastroesophageal hernia 07/06/2013  . GERD (gastroesophageal reflux disease)   . Hiatal hernia   . History of blood transfusion    "most of them related to OR's"   . HTN (hypertension)   . Hyperlipidemia   . IBS (irritable bowel syndrome)   . Incontinence 10/13/2012  .  Insomnia   . Ischemic chest pain ([place])   . Memory disorder 12/04/2014  . Metabolic syndrome 50/35/4656  . MGUS (monoclonal gammopathy of unknown significance) dx'd 11/2014   a. Neg BMB 11/2014.  . Multiple falls   . Obesity   . Obstructive sleep apnea    "have mask; don't wear it" (01/07/2015)  . Orthostasis   . PAT (paroxysmal atrial tachycardia)   . Personal history of colonic polyps 10/25/2011 & 12/02/11   not retrieved Dr Lyla Son & tubular adenomas  . Pneumonia 03/2014  . Sinus bradycardia    a. Baseline HR 50s-60s.  . Stroke [facility]) early 2000's   "small"; denies residual on 01/07/2015)    Past Surgical History:  Procedure Laterality Date  . BACK SURGERY    . BONE MARROW BIOPSY  11/2014  . CARDIAC  CATHETERIZATION  12/27/2014   Procedure: INTRAVASCULAR PRESSURE WIRE/FFR STUDY;  Surgeon: Larey Dresser, MD;  Location: [facility] CATH LAB;  Service: Cardiovascular;;  . CARDIAC CATHETERIZATION N/A 11/25/2016   Procedure: Left Heart Cath and Coronary Angiography;  Surgeon: Sherren Mocha, MD;  Location: [facility] CV LAB;  Service: Cardiovascular;  Laterality: N/A;  . CARDIOVERSION N/A 02/13/2016   Procedure: CARDIOVERSION;  Surgeon: Josue Hector, MD;  Location: [facility] ENDOSCOPY;  Service: Cardiovascular;  Laterality: N/A;  . CARPAL TUNNEL RELEASE Right 1980's  . CATARACT EXTRACTION Bilateral   . CATARACT EXTRACTION W/ INTRAOCULAR LENS  IMPLANT, BILATERAL  2000's  . CORONARY ANGIOPLASTY WITH STENT PLACEMENT  01/07/2015   "2"  . CORONARY STENT PLACEMENT    . DILATION AND CURETTAGE OF UTERUS    . ESOPHAGOGASTRODUODENOSCOPY (EGD) WITH ESOPHAGEAL DILATION  "several times"  . IR GASTROSTOMY TUBE REMOVAL  12/23/2022  . IR REPLC GASTRO/COLONIC TUBE PERCUT W/FLUORO  09/08/2022  . JOINT REPLACEMENT    . KNEE ARTHROSCOPY Left 1995  . LAPAROSCOPIC CHOLECYSTECTOMY  2003  . LEFT HEART CATHETERIZATION WITH CORONARY ANGIOGRAM N/A 12/27/2014   Procedure: LEFT HEART CATHETERIZATION WITH CORONARY ANGIOGRAM;  Surgeon: Larey Dresser, MD;  Location[facility] C[facility] LAB;  Service: Cardiovascular;  Laterality: N/A;  . PERCUTANEOUS CORONARY ROTOBLATOR INTERVENTION (PCI-R)  01/07/2015  . PERCUTANEOUS CORONARY ROTOBLATOR INTERVENTION (PCI-R) N/A 01/07/2015   Procedure: PERCUTANEOUS CORONARY ROTOBLATOR INTERVENTION (PCI-R);  Surgeon: Blane Ohara, MD;  Location: [facility] CATH LAB;  Service: Cardiovascular;  Laterality: N/A;  . POSTERIOR FUSION THORACIC SPINE  08/2015  . POSTERIOR LUMBAR FUSION  01/2015  . TOTAL KNEE ARTHROPLASTY Bilateral 1990's - 2000's    Social History   Socioeconomic History  . Marital status: Widowed    Spouse name: Not on file  . Number of children: 2  . Years of education: HS  . Highest education level: Not on  file  Occupational History  . Occupation: Licensed conveyancer- retired    Comment: retired  . Occupation: retired  Tobacco Use  . Smoking status: Never  . Smokeless tobacco: Never  Vaping Use  . Vaping Use: Never used  Substance and Sexual Activity  . Alcohol use: No    Comment: 01/07/2015 "glass of wine at Christmas, maybe"  . Drug use: No  . Sexual activity: Not Currently  Other Topics Concern  . Not on file  Social History Narrative   Patient is right handed   Long term resident of [facility]    Social Determinants of Health   Financial Resource Strain: Not on file  Food Insecurity: Not on file  Transportation Needs: Not on file  Physical Activity: Inactive (04/04/2020)  Exercise Vital Sign   . Days of Exercise per Week: 0 days   . Minutes of Exercise per Session: 0 min  Stress: Stress Concern Present (04/04/2020)   [place]   . Feeling of Stress : To some extent  Social Connections: Socially Isolated (04/04/2020)   Social Connection and Isolation Panel [NHANES]   . Frequency of Communication with Friends and Family: Three times a week   . Frequency of Social Gatherings with Friends and Family: Once a week   . Attends Religious Services: Never   . Active Member of Clubs or Organizations: No   . Attends Archivist Meetings: Never   . Marital Status: Widowed  Intimate Partner Violence: Not on file   Family History  Problem Relation Age of Onset  . Coronary artery disease Father   . Peripheral vascular disease Father   . Coronary artery disease Mother   . Coronary artery disease Brother   . Colon cancer Sister   . Emphysema Sister   . Sleep apnea Son   . Colon cancer Sister        spread to her brain  . Emphysema Brother   . Dementia Neg Hx       VITAL SIGNS BP 134/63   Pulse 72   Temp 98.6 F (37 C)   Resp 20   Ht '5\' 7"'$  (1.702 m)   Wt 177 lb 6.4 oz (80.5 kg)   SpO2 91%   BMI 27.78 kg/m    Outpatient Encounter Medications as of 01/18/2023  Medication Sig  . acetaminophen (TYLENOL) 325 MG tablet Take 650 mg by mouth every 8 (eight) hours.  Marland Kitchen aspirin EC 81 MG tablet Take 81 mg by mouth daily. Swallow whole.  . busPIRone (BUSPAR) 10 MG tablet Take 10 mg by mouth 3 (three) times daily.  . Cholecalciferol 25 MCG (1000 UT) tablet Place 1,000 Units into feeding tube daily. 9 am  . clobetasol (TEMOVATE) 0.05 % external solution Apply 1 application. topically 2 (two) times a week. Monday and Friday  . diclofenac Sodium (VOLTAREN) 1 % GEL Apply 2 g topically 3 (three) times daily. to neck and shoulders for pain management  . diltiazem (CARDIZEM) 120 MG tablet Take 120 mg by mouth every 8 (eight) hours.  Marland Kitchen ELIQUIS 5 MG TABS tablet Take 1 tablet (5 mg total) by mouth 2 (two) times daily.  . eszopiclone (LUNESTA) 2 MG TABS tablet Take 2 mg by mouth at bedtime as needed for sleep. Take immediately before bedtime  . fentaNYL (DURAGESIC) 25 MCG/HR Place 1 patch onto the skin every 3 (three) days.  Marland Kitchen FLUoxetine (PROZAC) 20 MG capsule Take 20 mg by mouth daily. Give with 40 mg to equal 60  . FLUoxetine (PROZAC) 40 MG capsule Take 40 mg by mouth daily. Give with 20 mg to equal 60  . furosemide (LASIX) 40 MG tablet Take 40 mg by mouth 2 (two) times daily. 9 am  . gabapentin (NEURONTIN) 300 MG capsule Take 300 mg by mouth 3 (three) times daily.  Marland Kitchen lidocaine 4 % Place 1 patch onto the skin in the morning and at bedtime.  . methocarbamol (ROBAXIN) 500 MG tablet Take 500 mg by mouth every 6 (six) hours as needed for muscle spasms.  . Miconazole Nitrate 2 % POWD by Does not apply route daily.  . naloxone (NARCAN) nasal spray 4 mg/0.1 mL Special Instructions: As needed for depressed respirations less than 8/minute. Notify  provider if given. If no effectiveness or improvement, contact 911. As Needed  . nitroGLYCERIN (NITROSTAT) 0.4 MG SL tablet Place 1 tablet (0.4 mg total) under the tongue every 5  (five) minutes as needed for chest pain (up to 3 doses).  Earney Navy Bicarbonate (KONVOMEP) 2-84 MG/ML SUSR Take 10 mLs by mouth in the morning and at bedtime.  . potassium chloride (MICRO-K) 10 MEQ CR capsule Take 20 mEq by mouth daily.  Marland Kitchen rOPINIRole (REQUIP) 0.5 MG tablet Take 0.5 mg by mouth in the morning and at bedtime.  . rosuvastatin (CRESTOR) 10 MG tablet Take 10 mg by mouth daily. 9 am  . traMADol (ULTRAM) 50 MG tablet Take 0.5 tablets (25 mg total) by mouth every 8 (eight) hours.  Marland Kitchen zinc oxide 20 % ointment Apply 1 Application topically in the morning, at noon, and at bedtime.  . [DISCONTINUED] eszopiclone 3 MG TABS Take 1 tablet (3 mg total) by mouth at bedtime. Take immediately before bedtime (Patient taking differently: Take 2 mg by mouth at bedtime. Take immediately before bedtime)  . [DISCONTINUED] saccharomyces boulardii (FLORASTOR) 250 MG capsule Take 250 mg by mouth 2 (two) times daily.   No facility-administered encounter medications on file as of 01/18/2023.     SIGNIFICANT DIAGNOSTIC EXAMS  PREVIOUS   02-23-22: TEE:  The left ventricular size is normal.  Mild left ventricular hypertrophy  LV ejection fraction = 45-50%.  Left ventricular systolic function is mildly reduced.  The right ventricle is normal in size and function.  The left atrium is moderately to severely dilated.  The right atrium is mildly to moderately dilated.  Estimated right ventricular systolic pressure is 27 mmHg.  IVC size was normal.  There is no significant valvular stenosis or regurgitation.  There is no pericardial effusion.  There is a pleural effusion present.  Recommend a limited TTE when HR is controlled to reassess   05-23-22: chest x-ray: mild congestive heart failure  07-14-22: chest x-ray:  Changes of bibasilar atelectasis  Nonspecific perihilar inflammatory changes Bilateral spinal fusion hardware   07-20-22: chest x-ray:  Nonspecific perihilar changes Emphysema No  improvement compared to 07-14-22.   07-21-22: chest x-ray:  No acute pulmonary process.   09-21-22: swallow study:   Recommend regular textures vs mechanical soft per treating SLP at SNF and thin liquids via cup/straw, alternate solids and liquids, implement effortful/hard swallow with solids, and Pt to sit upright for all eating and drinking and 30+ minutes after (avoid bending over as well). Pt will likely need to proceed with solid foods cautiously to see if she experiences and regurgitation. Recommend f/u with SLP services at SNF. Ok for PO medication whole with water.   NO NEW EXAMS   LABS REVIEWED: PREVIOUS   02-27-22: glucose 102; bun 13; creat 0.52; k+ 4.8; na++ 134; ca 8.9; GFR>90 04-08-22: wbc 7.2 hgb 7.2; hct 11.9; mcv 35.2 plt 185; glucose 114; bun 11; creat 0.37; k+ 4.5; na++ 132; ca 8.6; GFR >90 05-21-22: glucose 113; bun 19; creat 0.5; k+ 4.2; na++ 135; ca 8.5 gfr>60; protein 5.2; albumin 2.4  05-23-22: wbc 5.4; hgb 9.2; hct 29.6 plt 184; glucose 123; bun 21; creat 0.54; k+ 3.8; na++ 8.2; ca 8.2 gfr >60 07-01-22: glucose 91; bun 16; creat 0.44; k+ 4.1; na++ 135; ca 8.6; gfr>60 07-14-22: wbc 9.9; hgb 10.8; hct 35.3; mcv 83.8 plt 283; glucose 168; bun 24; creat 0.52; k+ 4.2; na++ 133; ca 8.6 gfr >60 urine culture:  blood culture: klebsiella pneumoniae  07-15-22: wbc 7.1; hgb 9.4; hct 31.2; mcv 85.2 plt 246; glucose 134; bun 28; creat 0.52; k+ 4.6; na++ 135; ca 8.2; gfr >60; protein 6.2 albumin 2.5; ast 50 alt 85 alk phos 283  07-19-22: blood culture: no growth  07-20-22: wbc 6.2; hgb 9.9; hct 33.2; mcv 84.9 plt 270; glucose 121; bun 24; creat 0.56; k+ 3.6; na++ 135; ca 8.5 gfr >60; protein 6.3 albumin 2.4; ast 31; alt 65; alk phos 286 07-21-22: c-dff + 07-23-22: wbc 10.6; hgb 35.1; hct 35.1; mcv 84.2 plt 244; glucose 113; bun 27; creat 0.57; k+ 4.6; na++ 135; ca 9.0; gfr >60; ast 44 alt 58; alk phos 258; protein 6.6; albumin 2.6  NO NEW LABS.     Review of Systems  Constitutional:  Negative for  malaise/fatigue.  Respiratory:  Negative for cough and shortness of breath.   Cardiovascular:  Negative for chest pain, palpitations and leg swelling.  Gastrointestinal:  Negative for abdominal pain, constipation and heartburn.  Musculoskeletal:  Negative for back pain, joint pain and myalgias.  Skin: Negative.   Neurological:  Negative for dizziness.  Psychiatric/Behavioral:  Positive for depression. The patient is nervous/anxious.     Physical Exam Constitutional:      General: She is not in acute distress.    Appearance: She is not diaphoretic.  Eyes:     Conjunctiva/sclera: Conjunctivae normal.  Neck:     Thyroid: No thyromegaly.     Vascular: No JVD.  Cardiovascular:     Rate and Rhythm: Normal rate and regular rhythm.     Pulses: Normal pulses.  Pulmonary:     Effort: Pulmonary effort is normal. No respiratory distress.     Breath sounds: Normal breath sounds. No wheezing.  Abdominal:     General: Bowel sounds are normal. There is no distension.     Palpations: Abdomen is soft.     Tenderness: There is no abdominal tenderness.  Musculoskeletal:        General: Normal range of motion.     Cervical back: Neck supple.     Right lower leg: No edema.     Left lower leg: No edema.     Comments: Able to move all extremities   Lymphadenopathy:     Cervical: No cervical adenopathy.  Skin:    General: Skin is warm and dry.  Neurological:     Mental Status: She is alert and oriented to person, place, and time.  Psychiatric:        Mood and Affect: Mood normal.     ASSESSMENT/ PLAN:     Ok Edwards NP [facility]   call [phone]

## 2023-02-01 ENCOUNTER — Other Ambulatory Visit: Payer: Self-pay | Admitting: Adult Health

## 2023-02-01 MED ORDER — FENTANYL 25 MCG/HR TD PT72: 1.0000 | MEDICATED_PATCH | TRANSDERMAL | 0 refills | Status: DC

## 2023-02-01 MED ORDER — TRAMADOL HCL 50 MG PO TABS: 25.0000 mg | ORAL_TABLET | Freq: Three times a day (TID) | ORAL | 0 refills | Status: DC

## 2023-02-11 ENCOUNTER — Encounter: Payer: Self-pay | Admitting: Adult Health

## 2023-02-11 ENCOUNTER — Non-Acute Institutional Stay (SKILLED_NURSING_FACILITY): Payer: Medicare HMO | Admitting: Adult Health

## 2023-02-11 DIAGNOSIS — K582 Mixed irritable bowel syndrome: Secondary | ICD-10-CM

## 2023-02-11 DIAGNOSIS — E785 Hyperlipidemia, unspecified: Secondary | ICD-10-CM

## 2023-02-11 DIAGNOSIS — F5101 Primary insomnia: Secondary | ICD-10-CM

## 2023-02-11 NOTE — Progress Notes (Signed)
Location:  [place] Room Number: [id number] P Place of Service:  SNF (31)   CODE STATUS: DNR  Allergies  Allergen Reactions   Buprenorphine Hcl Itching   Cymbalta [Duloxetine Hcl] Other (See Comments)    Other reaction(s): Other (See Comments) Personality changes - crying  2014   Morphine And Related Itching   Oxycodone-Acetaminophen Other (See Comments)    Personality changes    Valium [Diazepam] Other (See Comments)    Personality changes - "in another world" ;  Hallucinations  2015   Statins Other (See Comments)    Other reaction(s): Other (See Comments) Muscle weakness Muscle weakness   Morphine Itching   Altace [Ramipril] Other (See Comments)    unknown   Floxin [Ofloxacin] Other (See Comments)    unknown   Hydromorphone Other (See Comments)    Other reaction(s): Confusion (intolerance) unknown   Lovaza [Omega-3-Acid Ethyl Esters] Other (See Comments)    Muscle weakness    Trilipix [Choline Fenofibrate] Other (See Comments)    Muscle weakness    Zetia [Ezetimibe] Other (See Comments)    Muscle weakness     Chief Complaint  Patient presents with   Medical Management of Chronic Issues                  Hyperlipidemia ldl goal <100:  Irritable bowel syndrome with constipation and diarrhea: Insomnia:     HPI:  [name] [name] is a 82 year old long term resident of this facility being seen for the management of her chronic illnesses:   Hyperlipidemia ldl goal <100:  Irritable bowel syndrome with constipation and diarrhea: Insomnia. There are no reports of uncontrolled pain. 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 difficile colitis 07/21/2022   CAD (coronary artery disease)    a. Abnl nuc 11/2014. Cath 12/2014 - turned down for CABG. Ultimately s/p TTVP, rotational atherectomy, PTCA and stenting of the ostial LCx and left main into the LAD (crush technique), and IVUS of the LAD/Left main. // b. Myoview 11/17: EF 48, poor quality/significant artifact; inf-lateral, inferior ischemia; Intermediate Risk   Chronic atrial fibrillation ([place])    a.  First noted post-op 9/15 spinal fusion.  [name] [name] had cardioversion, not on anticoagulation. Fall risk, unsteady. // failed DCCV // Holter 10/17: AFib, Avg HR 97, PVCs, no other arrhythmia   Chronic back pain greater than 3 months duration    a. spinal stenosis.  Spinal fusion with rods in 2/15 at [facility] spinal fusion 9/15.   Chronic diastolic CHF    Echo [id number]:  EF 50-55%, trivial AI, midl MR, mod LAE, PASP 37 mmHg // Echocardiogram 03/2020: EF 45-50, no RWMA, mild LVH, mild reduced RVSF, mildly elevated PASP (RVSP 38.3), mild LAE, mild MR, mild to mod TR, trivial AI // Echo 9/21: EF 45, mild LVH, mildly reduced RV SF, moderate LAE, mild RAE, mild MR, mild AI      Circadian rhythm sleep disorder    CTS (carpal tunnel syndrome)    Deficiency anemia 11/10/2014   Depression    Diarrhea    Diverticulitis of colon  Esophageal stricture    Gastritis    Gastroesophageal hernia 07/06/2013   GERD (gastroesophageal reflux disease)    Hiatal hernia    History of blood transfusion    "most of them related to OR's"    HTN (hypertension)    Hyperlipidemia    IBS (irritable bowel syndrome)    Incontinence 10/13/2012   Insomnia    Ischemic chest pain ([place])    Memory disorder [id number]   Metabolic syndrome [id number]   MGUS (monoclonal gammopathy of unknown significance) dx'd 11/2014   a. Neg BMB 11/2014.   Multiple falls    Obesity    Obstructive sleep apnea    "have mask; don't wear it" (01/07/2015)   Orthostasis    PAT  (paroxysmal atrial tachycardia)    Personal history of colonic polyps 10/25/2011 & 12/02/11   not retrieved Dr Lyla Son & tubular adenomas   Pneumonia 03/2014   Sinus bradycardia    a. Baseline HR 50s-60s.   Stroke [facility]) early 2000's   "small"; denies residual on 01/07/2015)    Past Surgical History:  Procedure Laterality Date   BACK SURGERY     BONE MARROW BIOPSY  11/2014   CARDIAC CATHETERIZATION  12/27/2014   Procedure: INTRAVASCULAR PRESSURE WIRE/FFR STUDY;  Surgeon: Larey Dresser, MD;  Location: [facility] CATH LAB;  Service: Cardiovascular;;   CARDIAC CATHETERIZATION N/A 11/25/2016   Procedure: Left Heart Cath and Coronary Angiography;  Surgeon: Sherren Mocha, MD;  Location: [facility] CV LAB;  Service: Cardiovascular;  Laterality: N/A;   CARDIOVERSION N/A 02/13/2016   Procedure: CARDIOVERSION;  Surgeon: Josue Hector, MD;  Location: [place];  Service: Cardiovascular;  Laterality: N/A;   CARPAL TUNNEL RELEASE Right 1980's   CATARACT EXTRACTION Bilateral    CATARACT EXTRACTION W/ INTRAOCULAR LENS  IMPLANT, BILATERAL  2000's   CORONARY ANGIOPLASTY WITH STENT PLACEMENT  01/07/2015   "2"   CORONARY STENT PLACEMENT     DILATION AND CURETTAGE OF UTERUS     ESOPHAGOGASTRODUODENOSCOPY (EGD) WITH ESOPHAGEAL DILATION  "several times"   IR GASTROSTOMY TUBE REMOVAL  12/23/2022   IR [place] GASTRO/COLONIC TUBE PERCUT W/FLUORO  09/08/2022   JOINT REPLACEMENT     KNEE ARTHROSCOPY Left 1995   LAPAROSCOPIC CHOLECYSTECTOMY  2003   LEFT HEART CATHETERIZATION WITH CORONARY ANGIOGRAM N/A 12/27/2014   Procedure: LEFT HEART CATHETERIZATION WITH CORONARY ANGIOGRAM;  Surgeon: Larey Dresser, MD;  Location: [facility] CATH LAB;  Service: Cardiovascular;  Laterality: N/A;   PERCUTANEOUS CORONARY ROTOBLATOR INTERVENTION (PCI-R)  01/07/2015   PERCUTANEOUS CORONARY ROTOBLATOR INTERVENTION (PCI-R) N/A 01/07/2015   Procedure: PERCUTANEOUS CORONARY ROTOBLATOR INTERVENTION (PCI-R);  Surgeon: Blane Ohara, MD;  Location:  [facility] CATH LAB;  Service: Cardiovascular;  Laterality: N/A;   POSTERIOR FUSION THORACIC SPINE  08/2015   POSTERIOR LUMBAR FUSION  01/2015   TOTAL KNEE ARTHROPLASTY Bilateral 1990's - 2000's    Social History   Socioeconomic History   Marital status: Widowed    Spouse name: Not on file   Number of children: 2   Years of education: HS   Highest education level: Not on file  Occupational History   Occupation: Licensed conveyancer- retired    Comment: retired   Occupation: retired  Tobacco Use   Smoking status: Never   Smokeless tobacco: Never  Vaping Use   Vaping Use: Never used  Substance and Sexual Activity   Alcohol use: No    Comment: 01/07/2015 "glass of wine at Christmas, maybe"   Drug use: No   Sexual  activity: Not Currently  Other Topics Concern   Not on file  Social History Narrative   Patient is right handed   Long term resident of [facility]    Social Determinants of Health   Financial Resource Strain: Not on file  Food Insecurity: Not on file  Transportation Needs: Not on file  Physical Activity: Inactive (04/04/2020)   Exercise Vital Sign    Days of Exercise per Week: 0 days    Minutes of Exercise per Session: 0 min  Stress: Stress Concern Present (04/04/2020)   [place]    Feeling of Stress : To some extent  Social Connections: Socially Isolated (04/04/2020)   Social Connection and Isolation Panel [NHANES]    Frequency of Communication with Friends and Family: Three times a week    Frequency of Social Gatherings with Friends and Family: Once a week    Attends Religious Services: Never    Marine scientist or Organizations: No    Attends Archivist Meetings: Never    Marital Status: Widowed  Human resources officer Violence: Not on file   Family History  Problem Relation Age of Onset   Coronary artery disease Father    Peripheral vascular disease Father    Coronary artery disease Mother    Coronary  artery disease Brother    Colon cancer Sister    Emphysema Sister    Sleep apnea Son    Colon cancer Sister        spread to her brain   Emphysema Brother    Dementia Neg Hx       VITAL SIGNS BP 105/84   Pulse 78   Temp 98 F (36.7 C)   Ht '5\' 6"'$  (1.676 m)   Wt 179 lb (81.2 kg)   BMI 28.89 kg/m   Outpatient Encounter Medications as of 02/11/2023  Medication Sig   acetaminophen (TYLENOL) 325 MG tablet Take 650 mg by mouth every 8 (eight) hours.   aspirin EC 81 MG tablet Take 81 mg by mouth daily. Swallow whole.   busPIRone (BUSPAR) 10 MG tablet Take 10 mg by mouth 3 (three) times daily.   Cholecalciferol 25 MCG (1000 UT) tablet Place 1,000 Units into feeding tube daily. 9 am   clobetasol (TEMOVATE) 0.05 % external solution Apply 1 application. topically 2 (two) times a week. Monday and Friday   diclofenac Sodium (VOLTAREN) 1 % GEL Apply 2 g topically 3 (three) times daily. to neck and shoulders for pain management   diltiazem (CARDIZEM) 120 MG tablet Take 120 mg by mouth every 8 (eight) hours.   ELIQUIS 5 MG TABS tablet Take 1 tablet (5 mg total) by mouth 2 (two) times daily.   eszopiclone (LUNESTA) 2 MG TABS tablet Take 2 mg by mouth at bedtime.   fentaNYL (DURAGESIC) 25 MCG/HR Place 1 patch onto the skin every 3 (three) days.   FLUoxetine (PROZAC) 20 MG capsule Take 20 mg by mouth daily. Give with 40 mg to equal 60   FLUoxetine (PROZAC) 40 MG capsule Take 40 mg by mouth daily. Give with 20 mg to equal 60   furosemide (LASIX) 40 MG tablet Take 40 mg by mouth 2 (two) times daily. 9 am   gabapentin (NEURONTIN) 300 MG capsule Take 300 mg by mouth 3 (three) times daily.   lidocaine 4 % Place 1 patch onto the skin in the morning and at bedtime.   methocarbamol (ROBAXIN) 500 MG tablet Take 500  mg by mouth every 6 (six) hours as needed for muscle spasms.   naloxone (NARCAN) nasal spray 4 mg/0.1 mL Special Instructions: As needed for depressed respirations less than 8/minute. Notify  provider if given. If no effectiveness or improvement, contact 911. As Needed   nitroGLYCERIN (NITROSTAT) 0.4 MG SL tablet Place 1 tablet (0.4 mg total) under the tongue every 5 (five) minutes as needed for chest pain (up to 3 doses).   Omeprazole-Sodium Bicarbonate (KONVOMEP) 2-84 MG/ML SUSR Take 10 mLs by mouth in the morning and at bedtime.   ondansetron (ZOFRAN-ODT) 4 MG disintegrating tablet Take 4 mg by mouth every 6 (six) hours as needed for nausea or vomiting.   potassium chloride (MICRO-K) 10 MEQ CR capsule Take 20 mEq by mouth daily.   rOPINIRole (REQUIP) 0.5 MG tablet Take 0.5 mg by mouth in the morning and at bedtime.   rosuvastatin (CRESTOR) 10 MG tablet Take 10 mg by mouth daily. 9 am   traMADol (ULTRAM) 50 MG tablet Take 0.5 tablets (25 mg total) by mouth every 8 (eight) hours.   zinc oxide 20 % ointment Apply 1 Application topically in the morning, at noon, and at bedtime.   [DISCONTINUED] Miconazole Nitrate 2 % POWD by Does not apply route daily.   No facility-administered encounter medications on file as of 02/11/2023.     SIGNIFICANT DIAGNOSTIC EXAMS  PREVIOUS   02-23-22: TEE:  The left ventricular size is normal.  Mild left ventricular hypertrophy  LV ejection fraction = 45-50%.  Left ventricular systolic function is mildly reduced.  The right ventricle is normal in size and function.  The left atrium is moderately to severely dilated.  The right atrium is mildly to moderately dilated.  Estimated right ventricular systolic pressure is 27 mmHg.  IVC size was normal.  There is no significant valvular stenosis or regurgitation.  There is no pericardial effusion.  There is a pleural effusion present.  Recommend a limited TTE when HR is controlled to reassess   05-23-22: chest x-ray: mild congestive heart failure  07-14-22: chest x-ray:  Changes of bibasilar atelectasis  Nonspecific perihilar inflammatory changes Bilateral spinal fusion hardware   07-20-22: chest  x-ray:  Nonspecific perihilar changes Emphysema No improvement compared to 07-14-22.   07-21-22: chest x-ray:  No acute pulmonary process.   09-21-22: swallow study:   Recommend regular textures vs mechanical soft per treating SLP at SNF and thin liquids via cup/straw, alternate solids and liquids, implement effortful/hard swallow with solids, and Pt to sit upright for all eating and drinking and 30+ minutes after (avoid bending over as well). Pt will likely need to proceed with solid foods cautiously to see if [name] [name] experiences and regurgitation. Recommend f/u with SLP services at SNF. Ok for PO medication whole with water.   NO NEW EXAMS   LABS REVIEWED: PREVIOUS   02-27-22: glucose 102; bun 13; creat 0.52; k+ 4.8; na++ 134; ca 8.9; GFR>90 04-08-22: wbc 7.2 hgb 7.2; hct 11.9; mcv 35.2 plt 185; glucose 114; bun 11; creat 0.37; k+ 4.5; na++ 132; ca 8.6; GFR >90 05-21-22: glucose 113; bun 19; creat 0.5; k+ 4.2; na++ 135; ca 8.5 gfr>60; protein 5.2; albumin 2.4  05-23-22: wbc 5.4; hgb 9.2; hct 29.6 plt 184; glucose 123; bun 21; creat 0.54; k+ 3.8; na++ 8.2; ca 8.2 gfr >60 07-01-22: glucose 91; bun 16; creat 0.44; k+ 4.1; na++ 135; ca 8.6; gfr>60 07-14-22: wbc 9.9; hgb 10.8; hct 35.3; mcv 83.8 plt 283; glucose 168; bun 24; creat  0.52; k+ 4.2; na++ 133; ca 8.6 gfr >60 urine culture:  blood culture: klebsiella pneumoniae  07-15-22: wbc 7.1; hgb 9.4; hct 31.2; mcv 85.2 plt 246; glucose 134; bun 28; creat 0.52; k+ 4.6; na++ 135; ca 8.2; gfr >60; protein 6.2 albumin 2.5; ast 50 alt 85 alk phos 283  07-19-22: blood culture: no growth  07-20-22: wbc 6.2; hgb 9.9; hct 33.2; mcv 84.9 plt 270; glucose 121; bun 24; creat 0.56; k+ 3.6; na++ 135; ca 8.5 gfr >60; protein 6.3 albumin 2.4; ast 31; alt 65; alk phos 286 07-21-22: c-dff + 07-23-22: wbc 10.6; hgb 35.1; hct 35.1; mcv 84.2 plt 244; glucose 113; bun 27; creat 0.57; k+ 4.6; na++ 135; ca 9.0; gfr >60; ast 44 alt 58; alk phos 258; protein 6.6; albumin 2.6  NO NEW LABS.      Review of Systems  Constitutional:  Negative for malaise/fatigue.  Respiratory:  Negative for cough and shortness of breath.   Cardiovascular:  Negative for chest pain, palpitations and leg swelling.  Gastrointestinal:  Negative for abdominal pain, constipation and heartburn.  Musculoskeletal:  Negative for back pain, joint pain and myalgias.  Skin: Negative.   Neurological:  Negative for dizziness.  Psychiatric/Behavioral:  The patient is not nervous/anxious.     Physical Exam Constitutional:      General: [name] [name] is not in acute distress.    Appearance: [name] [name] is well-developed. [name] [name] is not diaphoretic.  Neck:     Thyroid: No thyromegaly.  Cardiovascular:     Rate and Rhythm: Normal rate and regular rhythm.     Pulses: Normal pulses.     Heart sounds: Normal heart sounds.  Pulmonary:     Effort: Pulmonary effort is normal. No respiratory distress.     Breath sounds: Normal breath sounds.  Abdominal:     General: Bowel sounds are normal. There is no distension.     Palpations: Abdomen is soft.     Tenderness: There is no abdominal tenderness.  Musculoskeletal:        General: Normal range of motion.     Cervical back: Neck supple.     Right lower leg: No edema.     Left lower leg: No edema.  Lymphadenopathy:     Cervical: No cervical adenopathy.  Skin:    General: Skin is warm and dry.  Neurological:     Mental Status: [name] [name] is alert and oriented to person, place, and time.  Psychiatric:        Mood and Affect: Mood normal.     ASSESSMENT/ PLAN:  TODAY  Hyperlipidemia ldl goal <100: will continue crestor 10 mg daily   2. Irritable bowel syndrome with constipation and diarrhea: will monitor   3. Insomnia: is on lunesta 2 mg nightly  PREVIOUS   4. Restless leg syndrome: will continue requip 0.5 mg twice daily   5. Esophageal stricture/gastroesophageal reflux disease without esophagitis/paraesophageal hernia. Will continue konvopem 2-84 mg/ml 10 cc twice daily   6.  Chronic diastolic CHF (congestive heart failure) EF 40-45% will continue lasix 40 mg twice daily with k+ 20 meq   7. Permanent atrial fibrillation: heart rate is stagle; will continue diltiazem 120 mg every 8 hours for rate control and eliquis 5 mg twice daily   8. Coronary artery disease involving native artery of native heart with unstable angina: will continue asa 81 mg daily has prn ntg  9. Major depression recurrent chronic: will continue prozac 60 mg daily and buspar 10 mg three times daily  for anxiety   10. Protein calorie malnutrition: protein 6.6 albumin 2.6 will monitor will continue supplements as directed  11. Chronic pain associated with significant psychosocial dysfunction: will continue duragesic 25 mcg patch every 3 days; robaxin 500 mg every 6 hours as needed; voltaren gel 2 gm three times daily and lidoderm patch; is off vicodin  12. Iron deficiency anemia associated with chronic blood loss: hgb 10.6 will monitor   13. Aortic atherosclerosis (ct 3-28-2) is on asa statin    Ok Edwards NP [facility]   call [phone]

## 2023-02-15 ENCOUNTER — Encounter: Payer: Self-pay | Admitting: Hematology

## 2023-02-16 ENCOUNTER — Non-Acute Institutional Stay (SKILLED_NURSING_FACILITY): Payer: Medicare HMO | Admitting: Adult Health

## 2023-02-16 ENCOUNTER — Encounter: Payer: Self-pay | Admitting: Adult Health

## 2023-02-16 DIAGNOSIS — R062 Wheezing: Secondary | ICD-10-CM | POA: Diagnosis not present

## 2023-02-16 NOTE — Progress Notes (Unsigned)
Location:  [place] Room Number: [id number] Place of Service:  SNF (31)   CODE STATUS: DNR   Allergies  Allergen Reactions   Buprenorphine Hcl Itching   Cymbalta [Duloxetine Hcl] Other (See Comments)    Other reaction(s): Other (See Comments) Personality changes - crying  2014   Morphine And Related Itching   Oxycodone-Acetaminophen Other (See Comments)    Personality changes    Valium [Diazepam] Other (See Comments)    Personality changes - "in another world" ;  Hallucinations  2015   Statins Other (See Comments)    Other reaction(s): Other (See Comments) Muscle weakness Muscle weakness   Morphine Itching   Altace [Ramipril] Other (See Comments)    unknown   Floxin [Ofloxacin] Other (See Comments)    unknown   Hydromorphone Other (See Comments)    Other reaction(s): Confusion (intolerance) unknown   Lovaza [Omega-3-Acid Ethyl Esters] Other (See Comments)    Muscle weakness    Trilipix [Choline Fenofibrate] Other (See Comments)    Muscle weakness    Zetia [Ezetimibe] Other (See Comments)    Muscle weakness     Chief Complaint  Patient presents with   Acute Visit    Cough    HPI:  She has been having a cough with yellow/Akacia Boltz sputum for the past several days. She has been placed on robitussin for her cough without good effect. She does complain of having wheezing present. There are no reports of fevers present.   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 difficile colitis 07/21/2022   CAD (coronary artery disease)    a. Abnl nuc 11/2014. Cath 12/2014 - turned down for CABG. Ultimately s/p TTVP, rotational atherectomy, PTCA and stenting of the ostial LCx and left main into the LAD (crush  technique), and IVUS of the LAD/Left main. // b. Myoview 11/17: EF 48, poor quality/significant artifact; inf-lateral, inferior ischemia; Intermediate Risk   Chronic atrial fibrillation ([place])    a.  First noted post-op 9/15 spinal fusion.  She had cardioversion, not on anticoagulation. Fall risk, unsteady. // failed DCCV // Holter 10/17: AFib, Avg HR 97, PVCs, no other arrhythmia   Chronic back pain greater than 3 months duration    a. spinal stenosis.  Spinal fusion with rods in 2/15 at [facility] spinal fusion 9/15.   Chronic diastolic CHF    Echo Q000111Q:  EF 50-55%, trivial AI, midl MR, mod LAE, PASP 37 mmHg // Echocardiogram 03/2020: EF 45-50, no RWMA, mild LVH, mild reduced RVSF, mildly elevated PASP (RVSP 38.3), mild LAE, mild MR, mild to mod TR, trivial AI // Echo 9/21: EF 45, mild LVH, mildly reduced RV SF, moderate LAE, mild RAE, mild MR, mild AI      Circadian rhythm sleep disorder    CTS (carpal tunnel syndrome)    Deficiency anemia 11/10/2014   Depression    Diarrhea    Diverticulitis of colon    Esophageal stricture    Gastritis    Gastroesophageal hernia 07/06/2013   GERD (gastroesophageal reflux disease)    Hiatal hernia    History of blood transfusion    "most of them related to OR's"    HTN (hypertension)  Hyperlipidemia    IBS (irritable bowel syndrome)    Incontinence 10/13/2012   Insomnia    Ischemic chest pain ([place])    Memory disorder [id number]   Metabolic syndrome [id number]   MGUS (monoclonal gammopathy of unknown significance) dx'd 11/2014   a. Neg BMB 11/2014.   Multiple falls    Obesity    Obstructive sleep apnea    "have mask; don't wear it" (01/07/2015)   Orthostasis    PAT (paroxysmal atrial tachycardia)    Personal history of colonic polyps 10/25/2011 & 12/02/11   not retrieved Dr Lyla Son & tubular adenomas   Pneumonia 03/2014   Sinus bradycardia    a. Baseline HR 50s-60s.   Stroke [facility]) early 2000's   "small"; denies residual on  01/07/2015)    Past Surgical History:  Procedure Laterality Date   BACK SURGERY     BONE MARROW BIOPSY  11/2014   CARDIAC CATHETERIZATION  12/27/2014   Procedure: INTRAVASCULAR PRESSURE WIRE/FFR STUDY;  Surgeon: Larey Dresser, MD;  Location: [facility] CATH LAB;  Service: Cardiovascular;;   CARDIAC CATHETERIZATION N/A 11/25/2016   Procedure: Left Heart Cath and Coronary Angiography;  Surgeon: Sherren Mocha, MD;  Location: [place] CV LAB;  Service: Cardiovascular;  Laterality: N/A;   CARDIOVERSION N/A 02/13/2016   Procedure: CARDIOVERSION;  Surgeon: Josue Hector, MD;  Location: [place];  Service: Cardiovascular;  Laterality: N/A;   CARPAL TUNNEL RELEASE Right 1980's   CATARACT EXTRACTION Bilateral    CATARACT EXTRACTION W/ INTRAOCULAR LENS  IMPLANT, BILATERAL  2000's   CORONARY ANGIOPLASTY WITH STENT PLACEMENT  01/07/2015   "2"   CORONARY STENT PLACEMENT     DILATION AND CURETTAGE OF UTERUS     ESOPHAGOGASTRODUODENOSCOPY (EGD) WITH ESOPHAGEAL DILATION  "several times"   IR GASTROSTOMY TUBE REMOVAL  12/23/2022   IR [place] GASTRO/COLONIC TUBE PERCUT W/FLUORO  09/08/2022   JOINT REPLACEMENT     KNEE ARTHROSCOPY Left 1995   LAPAROSCOPIC CHOLECYSTECTOMY  2003   LEFT HEART CATHETERIZATION WITH CORONARY ANGIOGRAM N/A 12/27/2014   Procedure: LEFT HEART CATHETERIZATION WITH CORONARY ANGIOGRAM;  Surgeon: Larey Dresser, MD;  Location: [facility] CATH LAB;  Service: Cardiovascular;  Laterality: N/A;   PERCUTANEOUS CORONARY ROTOBLATOR INTERVENTION (PCI-R)  01/07/2015   PERCUTANEOUS CORONARY ROTOBLATOR INTERVENTION (PCI-R) N/A 01/07/2015   Procedure: PERCUTANEOUS CORONARY ROTOBLATOR INTERVENTION (PCI-R);  Surgeon: Blane Ohara, MD;  Location: [facility] CATH LAB;  Service: Cardiovascular;  Laterality: N/A;   POSTERIOR FUSION THORACIC SPINE  08/2015   POSTERIOR LUMBAR FUSION  01/2015   TOTAL KNEE ARTHROPLASTY Bilateral 1990's - 2000's    Social History   Socioeconomic History   Marital status: Widowed    Spouse  name: Not on file   Number of children: 2   Years of education: HS   Highest education level: Not on file  Occupational History   Occupation: Licensed conveyancer- retired    Comment: retired   Occupation: retired  Tobacco Use   Smoking status: Never   Smokeless tobacco: Never  Vaping Use   Vaping Use: Never used  Substance and Sexual Activity   Alcohol use: No    Comment: 01/07/2015 "glass of wine at Christmas, maybe"   Drug use: No   Sexual activity: Not Currently  Other Topics Concern   Not on file  Social History Narrative   Patient is right handed   Long term resident of [facility]    Social Determinants of Health   Financial Resource Strain: Not on file  Food Insecurity:  Not on file  Transportation Needs: Not on file  Physical Activity: Inactive (04/04/2020)   Exercise Vital Sign    Days of Exercise per Week: 0 days    Minutes of Exercise per Session: 0 min  Stress: Stress Concern Present (04/04/2020)   [place]    Feeling of Stress : To some extent  Social Connections: Socially Isolated (04/04/2020)   Social Connection and Isolation Panel [NHANES]    Frequency of Communication with Friends and Family: Three times a week    Frequency of Social Gatherings with Friends and Family: Once a week    Attends Religious Services: Never    Marine scientist or Organizations: No    Attends Archivist Meetings: Never    Marital Status: Widowed  Human resources officer Violence: Not on file   Family History  Problem Relation Age of Onset   Coronary artery disease Father    Peripheral vascular disease Father    Coronary artery disease Mother    Coronary artery disease Brother    Colon cancer Sister    Emphysema Sister    Sleep apnea Son    Colon cancer Sister        spread to her brain   Emphysema Brother    Dementia Neg Hx       VITAL SIGNS BP (!) 130/54   Pulse 77   Temp 97.8 F (36.6 C)   Resp 20    Ht '5\' 6"'$  (1.676 m)   Wt 178 lb 4 oz (80.9 kg)   SpO2 90%   BMI 28.77 kg/m   Outpatient Encounter Medications as of 02/16/2023  Medication Sig   acetaminophen (TYLENOL) 325 MG tablet Take 650 mg by mouth every 8 (eight) hours.   aspirin EC 81 MG tablet Take 81 mg by mouth daily. Swallow whole.   busPIRone (BUSPAR) 10 MG tablet Take 10 mg by mouth 3 (three) times daily.   Cholecalciferol 25 MCG (1000 UT) tablet Place 1,000 Units into feeding tube daily. 9 am   clobetasol (TEMOVATE) 0.05 % external solution Apply 1 application. topically 2 (two) times a week. Monday and Friday   diclofenac Sodium (VOLTAREN) 1 % GEL Apply 2 g topically 3 (three) times daily. to neck and shoulders for pain management   diltiazem (CARDIZEM) 120 MG tablet Take 120 mg by mouth every 8 (eight) hours.   ELIQUIS 5 MG TABS tablet Take 1 tablet (5 mg total) by mouth 2 (two) times daily.   eszopiclone (LUNESTA) 2 MG TABS tablet Take 2 mg by mouth at bedtime.   fentaNYL (DURAGESIC) 25 MCG/HR Place 1 patch onto the skin every 3 (three) days.   FLUoxetine (PROZAC) 20 MG capsule Take 20 mg by mouth daily. Give with 40 mg to equal 60   FLUoxetine (PROZAC) 40 MG capsule Take 40 mg by mouth daily. Give with 20 mg to equal 60   furosemide (LASIX) 40 MG tablet Take 40 mg by mouth 2 (two) times daily. 9 am   gabapentin (NEURONTIN) 300 MG capsule Take 300 mg by mouth 3 (three) times daily.   lidocaine 4 % Place 1 patch onto the skin in the morning and at bedtime.   methocarbamol (ROBAXIN) 500 MG tablet Take 500 mg by mouth every 6 (six) hours as needed for muscle spasms.   naloxone (NARCAN) nasal spray 4 mg/0.1 mL Special Instructions: As needed for depressed respirations less than 8/minute. Notify provider if given. If  no effectiveness or improvement, contact 911. As Needed   nitroGLYCERIN (NITROSTAT) 0.4 MG SL tablet Place 1 tablet (0.4 mg total) under the tongue every 5 (five) minutes as needed for chest pain (up to 3 doses).    Omeprazole-Sodium Bicarbonate (KONVOMEP) 2-84 MG/ML SUSR Take 10 mLs by mouth in the morning and at bedtime.   ondansetron (ZOFRAN-ODT) 4 MG disintegrating tablet Take 4 mg by mouth every 6 (six) hours as needed for nausea or vomiting.   potassium chloride (MICRO-K) 10 MEQ CR capsule Take 20 mEq by mouth daily.   rOPINIRole (REQUIP) 0.5 MG tablet Take 0.5 mg by mouth in the morning and at bedtime.   rosuvastatin (CRESTOR) 10 MG tablet Take 10 mg by mouth daily. 9 am   traMADol (ULTRAM) 50 MG tablet Take 0.5 tablets (25 mg total) by mouth every 8 (eight) hours.   zinc oxide 20 % ointment Apply 1 Application topically in the morning, at noon, and at bedtime.   No facility-administered encounter medications on file as of 02/16/2023.     SIGNIFICANT DIAGNOSTIC EXAMS   PREVIOUS   02-23-22: TEE:  The left ventricular size is normal.  Mild left ventricular hypertrophy  LV ejection fraction = 45-50%.  Left ventricular systolic function is mildly reduced.  The right ventricle is normal in size and function.  The left atrium is moderately to severely dilated.  The right atrium is mildly to moderately dilated.  Estimated right ventricular systolic pressure is 27 mmHg.  IVC size was normal.  There is no significant valvular stenosis or regurgitation.  There is no pericardial effusion.  There is a pleural effusion present.  Recommend a limited TTE when HR is controlled to reassess   05-23-22: chest x-ray: mild congestive heart failure  07-14-22: chest x-ray:  Changes of bibasilar atelectasis  Nonspecific perihilar inflammatory changes Bilateral spinal fusion hardware   07-20-22: chest x-ray:  Nonspecific perihilar changes Emphysema No improvement compared to 07-14-22.   07-21-22: chest x-ray:  No acute pulmonary process.   09-21-22: swallow study:   Recommend regular textures vs mechanical soft per treating SLP at SNF and thin liquids via cup/straw, alternate solids and liquids, implement  effortful/hard swallow with solids, and Pt to sit upright for all eating and drinking and 30+ minutes after (avoid bending over as well). Pt will likely need to proceed with solid foods cautiously to see if she experiences and regurgitation. Recommend f/u with SLP services at SNF. Ok for PO medication whole with water.   NO NEW EXAMS   LABS REVIEWED: PREVIOUS   02-27-22: glucose 102; bun 13; creat 0.52; k+ 4.8; na++ 134; ca 8.9; GFR>90 04-08-22: wbc 7.2 hgb 7.2; hct 11.9; mcv 35.2 plt 185; glucose 114; bun 11; creat 0.37; k+ 4.5; na++ 132; ca 8.6; GFR >90 05-21-22: glucose 113; bun 19; creat 0.5; k+ 4.2; na++ 135; ca 8.5 gfr>60; protein 5.2; albumin 2.4  05-23-22: wbc 5.4; hgb 9.2; hct 29.6 plt 184; glucose 123; bun 21; creat 0.54; k+ 3.8; na++ 8.2; ca 8.2 gfr >60 07-01-22: glucose 91; bun 16; creat 0.44; k+ 4.1; na++ 135; ca 8.6; gfr>60 07-14-22: wbc 9.9; hgb 10.8; hct 35.3; mcv 83.8 plt 283; glucose 168; bun 24; creat 0.52; k+ 4.2; na++ 133; ca 8.6 gfr >60 urine culture:  blood culture: klebsiella pneumoniae  07-15-22: wbc 7.1; hgb 9.4; hct 31.2; mcv 85.2 plt 246; glucose 134; bun 28; creat 0.52; k+ 4.6; na++ 135; ca 8.2; gfr >60; protein 6.2 albumin 2.5; ast 50 alt  85 alk phos 283  07-19-22: blood culture: no growth  07-20-22: wbc 6.2; hgb 9.9; hct 33.2; mcv 84.9 plt 270; glucose 121; bun 24; creat 0.56; k+ 3.6; na++ 135; ca 8.5 gfr >60; protein 6.3 albumin 2.4; ast 31; alt 65; alk phos 286 07-21-22: c-dff + 07-23-22: wbc 10.6; hgb 35.1; hct 35.1; mcv 84.2 plt 244; glucose 113; bun 27; creat 0.57; k+ 4.6; na++ 135; ca 9.0; gfr >60; ast 44 alt 58; alk phos 258; protein 6.6; albumin 2.6  NO NEW LABS.     Review of Systems  Constitutional:  Negative for malaise/fatigue.  Respiratory:  Positive for cough, sputum production, shortness of breath and wheezing.   Cardiovascular:  Negative for chest pain, palpitations and leg swelling.  Gastrointestinal:  Negative for abdominal pain, constipation and heartburn.   Musculoskeletal:  Negative for back pain, joint pain and myalgias.  Skin: Negative.   Neurological:  Negative for dizziness.  Psychiatric/Behavioral:  The patient is not nervous/anxious.    Physical Exam Constitutional:      General: She is not in acute distress.    Appearance: She is well-developed. She is not diaphoretic.  Neck:     Thyroid: No thyromegaly.  Cardiovascular:     Rate and Rhythm: Normal rate and regular rhythm.     Pulses: Normal pulses.     Heart sounds: Normal heart sounds.  Pulmonary:     Effort: Pulmonary effort is normal. No respiratory distress.     Breath sounds: Wheezing present.  Abdominal:     General: Bowel sounds are normal. There is no distension.     Palpations: Abdomen is soft.     Tenderness: There is no abdominal tenderness.  Musculoskeletal:        General: Normal range of motion.     Cervical back: Neck supple.     Right lower leg: No edema.     Left lower leg: No edema.  Lymphadenopathy:     Cervical: No cervical adenopathy.  Skin:    General: Skin is warm and dry.  Neurological:     Mental Status: She is alert and oriented to person, place, and time.  Psychiatric:        Mood and Affect: Mood normal.       ASSESSMENT/ PLAN:  TODAY  Wheezing: will begin duoneb every 6 hours for 5 days  and will get a chest x-ray. Will treat further as indicated    Ok Edwards NP [facility]  call [phone]

## 2023-02-17 DIAGNOSIS — R062 Wheezing: Secondary | ICD-10-CM | POA: Insufficient documentation

## 2023-02-18 ENCOUNTER — Other Ambulatory Visit (HOSPITAL_COMMUNITY)
Admission: RE | Admit: 2023-02-18 | Discharge: 2023-02-18 | Disposition: A | Discharge status: Home / Self Care | Payer: Medicare HMO | Source: Skilled Nursing Facility | Attending: Adult Health | Admitting: Adult Health

## 2023-02-18 ENCOUNTER — Other Ambulatory Visit: Payer: Self-pay | Admitting: Adult Health

## 2023-02-18 DIAGNOSIS — I25110 Atherosclerotic heart disease of native coronary artery with unstable angina pectoris: Secondary | ICD-10-CM | POA: Insufficient documentation

## 2023-02-18 LAB — COMPREHENSIVE METABOLIC PANEL
ALT: 24 U/L (ref 0–44)
AST: 17 U/L (ref 15–41)
Albumin: 2.9 g/dL — ABNORMAL LOW (ref 3.5–5.0)
Alkaline Phosphatase: 155 U/L — ABNORMAL HIGH (ref 38–126)
Anion gap: 8 (ref 5–15)
BUN: 11 mg/dL (ref 8–23)
CO2: 27 mmol/L (ref 22–32)
Calcium: 8.5 mg/dL — ABNORMAL LOW (ref 8.9–10.3)
Chloride: 99 mmol/L (ref 98–111)
Creatinine, Ser: 0.61 mg/dL (ref 0.44–1.00)
GFR, Estimated: >60 mL/min (ref >60)
Glucose, Bld: 96 mg/dL (ref 70–99)
Potassium: 3.7 mmol/L (ref 3.5–5.1)
Sodium: 134 mmol/L — ABNORMAL LOW (ref 135–145)
Total Bilirubin: 0.7 mg/dL (ref 0.3–1.2)
Total Protein: 6.6 g/dL (ref 6.5–8.1)

## 2023-02-18 LAB — LIPID PANEL
Cholesterol: 107 mg/dL (ref 0–200)
HDL: 55 mg/dL (ref >40)
LDL Cholesterol: 40 mg/dL (ref 0–99)
Total CHOL/HDL Ratio: 1.9 RATIO
Triglycerides: 58 mg/dL (ref <150)
VLDL: 12 mg/dL (ref 0–40)

## 2023-02-18 LAB — CBC
HCT: 36.6 % (ref 36.0–46.0)
Hemoglobin: 11.5 g/dL — ABNORMAL LOW (ref 12.0–15.0)
MCH: 27.9 pg (ref 26.0–34.0)
MCHC: 31.4 g/dL (ref 30.0–36.0)
MCV: 88.8 fL (ref 80.0–100.0)
Platelets: 200 10*3/uL (ref 150–400)
RBC: 4.12 MIL/uL (ref 3.87–5.11)
RDW: 16.7 % — ABNORMAL HIGH (ref 11.5–15.5)
WBC: 8.4 10*3/uL (ref 4.0–10.5)
nRBC: 0 % (ref 0.0–0.2)

## 2023-02-18 MED ORDER — ESZOPICLONE 2 MG PO TABS: 2.0000 mg | ORAL_TABLET | Freq: Every day | ORAL | 0 refills | Status: DC

## 2023-02-19 ENCOUNTER — Other Ambulatory Visit (HOSPITAL_COMMUNITY): Payer: Self-pay | Admitting: Adult Health

## 2023-02-19 ENCOUNTER — Encounter (HOSPITAL_COMMUNITY): Payer: Self-pay | Admitting: Adult Health

## 2023-02-19 DIAGNOSIS — Z1231 Encounter for screening mammogram for malignant neoplasm of breast: Secondary | ICD-10-CM

## 2023-02-22 ENCOUNTER — Ambulatory Visit: Payer: Medicare HMO | Admitting: Podiatry

## 2023-02-23 ENCOUNTER — Non-Acute Institutional Stay (SKILLED_NURSING_FACILITY): Payer: Medicare HMO | Admitting: Adult Health

## 2023-02-23 ENCOUNTER — Ambulatory Visit (HOSPITAL_COMMUNITY)
Admission: RE | Admit: 2023-02-23 | Discharge: 2023-02-23 | Disposition: A | Discharge status: Home / Self Care | Payer: Medicare HMO | Source: Ambulatory Visit | Attending: Adult Health | Admitting: Adult Health

## 2023-02-23 ENCOUNTER — Other Ambulatory Visit (HOSPITAL_COMMUNITY): Payer: Self-pay | Admitting: Adult Health

## 2023-02-23 ENCOUNTER — Encounter: Payer: Self-pay | Admitting: Adult Health

## 2023-02-23 DIAGNOSIS — I700 Atherosclerosis of aorta: Secondary | ICD-10-CM | POA: Diagnosis not present

## 2023-02-23 DIAGNOSIS — R051 Acute cough: Secondary | ICD-10-CM | POA: Insufficient documentation

## 2023-02-23 DIAGNOSIS — J690 Pneumonitis due to inhalation of food and vomit: Secondary | ICD-10-CM | POA: Diagnosis not present

## 2023-02-23 DIAGNOSIS — J189 Pneumonia, unspecified organism: Secondary | ICD-10-CM

## 2023-02-23 NOTE — Progress Notes (Signed)
Location:  [place] Room Number: 159 Place of Service:  SNF (31)   CODE STATUS: dnr   Allergies  Allergen Reactions   Buprenorphine Hcl Itching   Cymbalta [Duloxetine Hcl] Other (See Comments)    Other reaction(s): Other (See Comments) Personality changes - crying  2014   Morphine And Related Itching   Oxycodone-Acetaminophen Other (See Comments)    Personality changes    Valium [Diazepam] Other (See Comments)    Personality changes - "in another world" ;  Hallucinations  2015   Statins Other (See Comments)    Other reaction(s): Other (See Comments) Muscle weakness Muscle weakness   Morphine Itching   Altace [Ramipril] Other (See Comments)    unknown   Floxin [Ofloxacin] Other (See Comments)    unknown   Hydromorphone Other (See Comments)    Other reaction(s): Confusion (intolerance) unknown   Lovaza [Omega-3-Acid Ethyl Esters] Other (See Comments)    Muscle weakness    Trilipix [Choline Fenofibrate] Other (See Comments)    Muscle weakness    Zetia [Ezetimibe] Other (See Comments)    Muscle weakness     Chief Complaint  Patient presents with   Acute Visit    Follow up chest x-ray     HPI:  She has been having a congestive cough for the past couple of weeks. There have been no reports of fevers present. She does have increased fatigue present. There was an x-ray done on 02-16-23 which did not demonstrate any disease process. Her cough has changed sound "deeper" in her chest. She does complain of increased shortness of breath. Her x-ray today demonstrates pneumonia with possible aspiration.   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 difficile colitis 07/21/2022   CAD (coronary  artery disease)    a. Abnl nuc 11/2014. Cath 12/2014 - turned down for CABG. Ultimately s/p TTVP, rotational atherectomy, PTCA and stenting of the ostial LCx and left main into the LAD (crush technique), and IVUS of the LAD/Left main. // b. Myoview 11/17: EF 48, poor quality/significant artifact; inf-lateral, inferior ischemia; Intermediate Risk   Chronic atrial fibrillation ([place])    a.  First noted post-op 9/15 spinal fusion.  She had cardioversion, not on anticoagulation. Fall risk, unsteady. // failed DCCV // Holter 10/17: AFib, Avg HR 97, PVCs, no other arrhythmia   Chronic back pain greater than 3 months duration    a. spinal stenosis.  Spinal fusion with rods in 2/15 at [facility] spinal fusion 9/15.   Chronic diastolic CHF    Echo Q000111Q:  EF 50-55%, trivial AI, midl MR, mod LAE, PASP 37 mmHg // Echocardiogram 03/2020: EF 45-50, no RWMA, mild LVH, mild reduced RVSF, mildly elevated PASP (RVSP 38.3), mild LAE, mild MR, mild to mod TR, trivial AI // Echo 9/21: EF 45, mild LVH, mildly reduced RV SF, moderate LAE, mild RAE, mild MR, mild AI      Circadian rhythm sleep disorder    CTS (carpal tunnel syndrome)    Deficiency anemia 11/10/2014   Depression    Diarrhea    Diverticulitis of colon    Esophageal stricture    Gastritis    Gastroesophageal hernia 07/06/2013   GERD (gastroesophageal  reflux disease)    Hiatal hernia    History of blood transfusion    "most of them related to OR's"    HTN (hypertension)    Hyperlipidemia    IBS (irritable bowel syndrome)    Incontinence 10/13/2012   Insomnia    Ischemic chest pain ([place])    Memory disorder [id number]   Metabolic syndrome [id number]   MGUS (monoclonal gammopathy of unknown significance) dx'd 11/2014   a. Neg BMB 11/2014.   Multiple falls    Obesity    Obstructive sleep apnea    "have mask; don't wear it" (01/07/2015)   Orthostasis    PAT (paroxysmal atrial tachycardia)    Personal history of colonic polyps 10/25/2011 &  12/02/11   not retrieved Dr Lyla Son & tubular adenomas   Pneumonia 03/2014   Sinus bradycardia    a. Baseline HR 50s-60s.   Stroke [facility]) early 2000's   "small"; denies residual on 01/07/2015)    Past Surgical History:  Procedure Laterality Date   BACK SURGERY     BONE MARROW BIOPSY  11/2014   CARDIAC CATHETERIZATION  12/27/2014   Procedure: INTRAVASCULAR PRESSURE WIRE/FFR STUDY;  Surgeon: Larey Dresser, MD;  Location: [facility] CATH LAB;  Service: Cardiovascular;;   CARDIAC CATHETERIZATION N/A 11/25/2016   Procedure: Left Heart Cath and Coronary Angiography;  Surgeon: Sherren Mocha, MD;  Location: [facility] LAB;  Service: Cardiovascular;  Laterality: N/A;   CARDIOVERSION N/A 02/13/2016   Procedure: CARDIOVERSION;  Surgeon: Josue Hector, MD;  Location: [place];  Service: Cardiovascular;  Laterality: N/A;   CARPAL TUNNEL RELEASE Right 1980's   CATARACT EXTRACTION Bilateral    CATARACT EXTRACTION W/ INTRAOCULAR LENS  IMPLANT, BILATERAL  2000's   CORONARY ANGIOPLASTY WITH STENT PLACEMENT  01/07/2015   "2"   CORONARY STENT PLACEMENT     DILATION AND CURETTAGE OF UTERUS     ESOPHAGOGASTRODUODENOSCOPY (EGD) WITH ESOPHAGEAL DILATION  "several times"   IR GASTROSTOMY TUBE REMOVAL  12/23/2022   IR [place] GASTRO/COLONIC TUBE PERCUT W/FLUORO  09/08/2022   JOINT REPLACEMENT     KNEE ARTHROSCOPY Left 1995   LAPAROSCOPIC CHOLECYSTECTOMY  2003   LEFT HEART CATHETERIZATION WITH CORONARY ANGIOGRAM N/A 12/27/2014   Procedure: LEFT HEART CATHETERIZATION WITH CORONARY ANGIOGRAM;  Surgeon: Larey Dresser, MD;  Location: [facility] CATH LAB;  Service: Cardiovascular;  Laterality: N/A;   PERCUTANEOUS CORONARY ROTOBLATOR INTERVENTION (PCI-R)  01/07/2015   PERCUTANEOUS CORONARY ROTOBLATOR INTERVENTION (PCI-R) N/A 01/07/2015   Procedure: PERCUTANEOUS CORONARY ROTOBLATOR INTERVENTION (PCI-R);  Surgeon: Blane Ohara, MD;  Location: [facility] CATH LAB;  Service: Cardiovascular;  Laterality: N/A;   POSTERIOR FUSION  THORACIC SPINE  08/2015   POSTERIOR LUMBAR FUSION  01/2015   TOTAL KNEE ARTHROPLASTY Bilateral 1990's - 2000's    Social History   Socioeconomic History   Marital status: Widowed    Spouse name: Not on file   Number of children: 2   Years of education: HS   Highest education level: Not on file  Occupational History   Occupation: Licensed conveyancer- retired    Comment: retired   Occupation: retired  Tobacco Use   Smoking status: Never   Smokeless tobacco: Never  Vaping Use   Vaping Use: Never used  Substance and Sexual Activity   Alcohol use: No    Comment: 01/07/2015 "glass of wine at Christmas, maybe"   Drug use: No   Sexual activity: Not Currently  Other Topics Concern   Not on file  Social History Narrative  Patient is right handed   Long term resident of [facility]    Social Determinants of Health   Financial Resource Strain: Not on file  Food Insecurity: Not on file  Transportation Needs: Not on file  Physical Activity: Inactive (04/04/2020)   Exercise Vital Sign    Days of Exercise per Week: 0 days    Minutes of Exercise per Session: 0 min  Stress: Stress Concern Present (04/04/2020)   [place]    Feeling of Stress : To some extent  Social Connections: Socially Isolated (04/04/2020)   Social Connection and Isolation Panel [NHANES]    Frequency of Communication with Friends and Family: Three times a week    Frequency of Social Gatherings with Friends and Family: Once a week    Attends Religious Services: Never    Marine scientist or Organizations: No    Attends Archivist Meetings: Never    Marital Status: Widowed  Human resources officer Violence: Not on file   Family History  Problem Relation Age of Onset   Coronary artery disease Father    Peripheral vascular disease Father    Coronary artery disease Mother    Coronary artery disease Brother    Colon cancer Sister    Emphysema Sister     Sleep apnea Son    Colon cancer Sister        spread to her brain   Emphysema Brother    Dementia Neg Hx       VITAL SIGNS BP 118/60   Pulse 96   Temp 97.7 F (36.5 C)   Resp 20   Ht '5\' 6"'$  (1.676 m)   Wt 172 lb 6.4 oz (78.2 kg)   SpO2 97%   BMI 27.83 kg/m   Outpatient Encounter Medications as of 02/23/2023  Medication Sig   acetaminophen (TYLENOL) 325 MG tablet Take 650 mg by mouth every 8 (eight) hours.   aspirin EC 81 MG tablet Take 81 mg by mouth daily. Swallow whole.   busPIRone (BUSPAR) 10 MG tablet Take 10 mg by mouth 3 (three) times daily.   Cholecalciferol 25 MCG (1000 UT) tablet Place 1,000 Units into feeding tube daily. 9 am   clobetasol (TEMOVATE) 0.05 % external solution Apply 1 application. topically 2 (two) times a week. Monday and Friday   diclofenac Sodium (VOLTAREN) 1 % GEL Apply 2 g topically 3 (three) times daily. to neck and shoulders for pain management   diltiazem (CARDIZEM) 120 MG tablet Take 120 mg by mouth every 8 (eight) hours.   ELIQUIS 5 MG TABS tablet Take 1 tablet (5 mg total) by mouth 2 (two) times daily.   eszopiclone (LUNESTA) 2 MG TABS tablet Take 1 tablet (2 mg total) by mouth at bedtime.   fentaNYL (DURAGESIC) 25 MCG/HR Place 1 patch onto the skin every 3 (three) days.   FLUoxetine (PROZAC) 20 MG capsule Take 20 mg by mouth daily. Give with 40 mg to equal 60   FLUoxetine (PROZAC) 40 MG capsule Take 40 mg by mouth daily. Give with 20 mg to equal 60   furosemide (LASIX) 40 MG tablet Take 40 mg by mouth 2 (two) times daily. 9 am   gabapentin (NEURONTIN) 300 MG capsule Take 300 mg by mouth 3 (three) times daily.   lidocaine 4 % Place 1 patch onto the skin in the morning and at bedtime.   methocarbamol (ROBAXIN) 500 MG tablet Take 500 mg by mouth every 6 (  six) hours as needed for muscle spasms.   naloxone (NARCAN) nasal spray 4 mg/0.1 mL Special Instructions: As needed for depressed respirations less than 8/minute. Notify provider if given. If no  effectiveness or improvement, contact 911. As Needed   nitroGLYCERIN (NITROSTAT) 0.4 MG SL tablet Place 1 tablet (0.4 mg total) under the tongue every 5 (five) minutes as needed for chest pain (up to 3 doses).   Omeprazole-Sodium Bicarbonate (KONVOMEP) 2-84 MG/ML SUSR Take 10 mLs by mouth in the morning and at bedtime.   ondansetron (ZOFRAN-ODT) 4 MG disintegrating tablet Take 4 mg by mouth every 6 (six) hours as needed for nausea or vomiting.   potassium chloride (MICRO-K) 10 MEQ CR capsule Take 20 mEq by mouth daily.   rOPINIRole (REQUIP) 0.5 MG tablet Take 0.5 mg by mouth in the morning and at bedtime.   rosuvastatin (CRESTOR) 10 MG tablet Take 10 mg by mouth daily. 9 am   traMADol (ULTRAM) 50 MG tablet Take 0.5 tablets (25 mg total) by mouth every 8 (eight) hours.   zinc oxide 20 % ointment Apply 1 Application topically in the morning, at noon, and at bedtime.   No facility-administered encounter medications on file as of 02/23/2023.     SIGNIFICANT DIAGNOSTIC EXAMS  PREVIOUS   02-23-22: TEE:  The left ventricular size is normal.  Mild left ventricular hypertrophy  LV ejection fraction = 45-50%.  Left ventricular systolic function is mildly reduced.  The right ventricle is normal in size and function.  The left atrium is moderately to severely dilated.  The right atrium is mildly to moderately dilated.  Estimated right ventricular systolic pressure is 27 mmHg.  IVC size was normal.  There is no significant valvular stenosis or regurgitation.  There is no pericardial effusion.  There is a pleural effusion present.  Recommend a limited TTE when HR is controlled to reassess   05-23-22: chest x-ray: mild congestive heart failure  07-14-22: chest x-ray:  Changes of bibasilar atelectasis  Nonspecific perihilar inflammatory changes Bilateral spinal fusion hardware   07-20-22: chest x-ray:  Nonspecific perihilar changes Emphysema No improvement compared to 07-14-22.   07-21-22: chest  x-ray:  No acute pulmonary process.   09-21-22: swallow study:   Recommend regular textures vs mechanical soft per treating SLP at SNF and thin liquids via cup/straw, alternate solids and liquids, implement effortful/hard swallow with solids, and Pt to sit upright for all eating and drinking and 30+ minutes after (avoid bending over as well). Pt will likely need to proceed with solid foods cautiously to see if she experiences and regurgitation. Recommend f/u with SLP services at SNF. Ok for PO medication whole with water.   TODAY  02-16-23: chest x-ray:  No radiographic evidence of acute cardiopulmonary disease. In comparison to prior exam there has been resolution of subsegmental atelectasis and small bilateral pleural effusion Mild cardiomegaly Mild osteopenia Moderate osteoarthritis  02-23-23: chest x-ray:  Bibasilar opacities, which could represent atelectasis, aspiration, and/or pneumonia. Suspected trace bilateral pleural effusions.   LABS REVIEWED: PREVIOUS   02-27-22: glucose 102; bun 13; creat 0.52; k+ 4.8; na++ 134; ca 8.9; GFR>90 04-08-22: wbc 7.2 hgb 7.2; hct 11.9; mcv 35.2 plt 185; glucose 114; bun 11; creat 0.37; k+ 4.5; na++ 132; ca 8.6; GFR >90 05-21-22: glucose 113; bun 19; creat 0.5; k+ 4.2; na++ 135; ca 8.5 gfr>60; protein 5.2; albumin 2.4  05-23-22: wbc 5.4; hgb 9.2; hct 29.6 plt 184; glucose 123; bun 21; creat 0.54; k+ 3.8; na++ 8.2; ca  8.2 gfr >60 07-01-22: glucose 91; bun 16; creat 0.44; k+ 4.1; na++ 135; ca 8.6; gfr>60 07-14-22: wbc 9.9; hgb 10.8; hct 35.3; mcv 83.8 plt 283; glucose 168; bun 24; creat 0.52; k+ 4.2; na++ 133; ca 8.6 gfr >60 urine culture:  blood culture: klebsiella pneumoniae  07-15-22: wbc 7.1; hgb 9.4; hct 31.2; mcv 85.2 plt 246; glucose 134; bun 28; creat 0.52; k+ 4.6; na++ 135; ca 8.2; gfr >60; protein 6.2 albumin 2.5; ast 50 alt 85 alk phos 283  07-19-22: blood culture: no growth  07-20-22: wbc 6.2; hgb 9.9; hct 33.2; mcv 84.9 plt 270; glucose 121; bun 24;  creat 0.56; k+ 3.6; na++ 135; ca 8.5 gfr >60; protein 6.3 albumin 2.4; ast 31; alt 65; alk phos 286 07-21-22: c-dff + 07-23-22: wbc 10.6; hgb 35.1; hct 35.1; mcv 84.2 plt 244; glucose 113; bun 27; creat 0.57; k+ 4.6; na++ 135; ca 9.0; gfr >60; ast 44 alt 58; alk phos 258; protein 6.6; albumin 2.6  TODAY  02-18-23: wbc 8.4; hgb 11.5; hct 36.6; mcv 88.8 plt 200; glucose 96; bun 11; creat 0.61; k+ 3.7; na++ 134; ca 8.5; gfr >60; alk phos 155; protein 6.6; albumin 2.9; chol 107; ldl 40; trig 58; hdl 55    Review of Systems  Constitutional:  Positive for malaise/fatigue.  Respiratory:  Positive for cough, sputum production, shortness of breath and wheezing.   Cardiovascular:  Negative for chest pain, palpitations and leg swelling.  Gastrointestinal:  Negative for abdominal pain, constipation and heartburn.  Musculoskeletal:  Negative for back pain, joint pain and myalgias.  Skin: Negative.   Neurological:  Negative for dizziness.  Psychiatric/Behavioral:  The patient is not nervous/anxious.    Physical Exam Constitutional:      General: She is not in acute distress.    Appearance: She is well-developed. She is not diaphoretic.  Neck:     Thyroid: No thyromegaly.  Cardiovascular:     Rate and Rhythm: Normal rate and regular rhythm.     Pulses: Normal pulses.     Heart sounds: Normal heart sounds.  Pulmonary:     Effort: No respiratory distress.     Breath sounds: Wheezing, rhonchi and rales present.     Comments: Increase respiratory effort  Abdominal:     General: Bowel sounds are normal. There is no distension.     Palpations: Abdomen is soft.     Tenderness: There is no abdominal tenderness.  Musculoskeletal:        General: Normal range of motion.     Cervical back: Neck supple.     Right lower leg: No edema.     Left lower leg: No edema.  Lymphadenopathy:     Cervical: No cervical adenopathy.  Skin:    General: Skin is warm and dry.     Coloration: Skin is pale.  Neurological:      Mental Status: She is alert and oriented to person, place, and time.  Psychiatric:        Mood and Affect: Mood normal.      ASSESSMENT/ PLAN:  TODAY  HCAP (health care associated pneumonia) versus aspiration pneumonia  of both lungs due to gastric secretions unspecified part of lung.   Will begin solumedrol 125 mg IM now; then in AM begin 40 mg prednisone through 02-28-23 Will begin duoneb every 6 hours through 03-04-23 Will begin robitussin 20 mL every 6 hours through 03-03-23 Will begin doxycycline 100 mg twice daily through 03-04-23.  Will have speech therapy  evaluate and treat as indicted.    Ok Edwards NP [facility]  call [phone]

## 2023-02-24 ENCOUNTER — Ambulatory Visit (HOSPITAL_COMMUNITY): Payer: Medicare HMO

## 2023-03-01 ENCOUNTER — Non-Acute Institutional Stay: Payer: Self-pay | Admitting: Family Medicine

## 2023-03-01 DIAGNOSIS — F5101 Primary insomnia: Secondary | ICD-10-CM

## 2023-03-01 DIAGNOSIS — G894 Chronic pain syndrome: Secondary | ICD-10-CM

## 2023-03-01 DIAGNOSIS — F339 Major depressive disorder, recurrent, unspecified: Secondary | ICD-10-CM

## 2023-03-01 DIAGNOSIS — G2581 Restless legs syndrome: Secondary | ICD-10-CM

## 2023-03-01 DIAGNOSIS — I4821 Permanent atrial fibrillation: Secondary | ICD-10-CM

## 2023-03-01 DIAGNOSIS — I25110 Atherosclerotic heart disease of native coronary artery with unstable angina pectoris: Secondary | ICD-10-CM

## 2023-03-01 DIAGNOSIS — K2100 Gastro-esophageal reflux disease with esophagitis, without bleeding: Secondary | ICD-10-CM

## 2023-03-01 DIAGNOSIS — I5032 Chronic diastolic (congestive) heart failure: Secondary | ICD-10-CM

## 2023-03-01 NOTE — Progress Notes (Signed)
Location:  [place] Room Number: 159 Place of Service:  SNF (31)   CODE STATUS: DNR  Allergies  Allergen Reactions   Buprenorphine Hcl Itching   Cymbalta [Duloxetine Hcl] Other (See Comments)    Other reaction(s): Other (See Comments) Personality changes - crying  2014   Morphine And Related Itching   Oxycodone-Acetaminophen Other (See Comments)    Personality changes    Valium [Diazepam] Other (See Comments)    Personality changes - "in another world" ;  Hallucinations  2015   Statins Other (See Comments)    Other reaction(s): Other (See Comments) Muscle weakness Muscle weakness   Morphine Itching   Altace [Ramipril] Other (See Comments)    unknown   Floxin [Ofloxacin] Other (See Comments)    unknown   Hydromorphone Other (See Comments)    Other reaction(s): Confusion (intolerance) unknown   Lovaza [Omega-3-Acid Ethyl Esters] Other (See Comments)    Muscle weakness    Trilipix [Choline Fenofibrate] Other (See Comments)    Muscle weakness    Zetia [Ezetimibe] Other (See Comments)    Muscle weakness     Chief Complaint  Patient presents with   Medical Management of Chronic Illnesses    HPI: [name] [name] is an 82 year old long-term resident of this facility being seen for management of her chronic medical illnesses.  Seen last week for productive cough and increased shortness of breath. Was diagnosed with HCAP vs aspiration pneumonia which was treated with antibiotics and course of steroids.   Patient reports she's feeling well today. She notes significant improvement in her cough and breathing compared to prior. Denies pain or other complaints.   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 difficile colitis 07/21/2022   CAD (coronary artery disease)    a. Abnl nuc 11/2014. Cath 12/2014 - turned down for CABG. Ultimately s/p TTVP, rotational atherectomy, PTCA and stenting of the ostial LCx and left main into the LAD (crush technique), and IVUS of the LAD/Left main. // b. Myoview 11/17: EF 48, poor quality/significant artifact; inf-lateral, inferior ischemia; Intermediate Risk   Chronic atrial fibrillation ([place])    a.  First noted post-op 9/15 spinal fusion.  She had cardioversion, not on anticoagulation. Fall risk, unsteady. // failed DCCV // Holter 10/17: AFib, Avg HR 97, PVCs, no other arrhythmia   Chronic back pain greater than 3 months duration    a. spinal stenosis.  Spinal fusion with rods in 2/15 at [facility] spinal fusion 9/15.   Chronic diastolic CHF    Echo [id number]:  EF 50-55%, trivial AI, midl MR, mod LAE, PASP 37 mmHg // Echocardiogram 03/2020: EF 45-50, no RWMA, mild LVH, mild reduced RVSF, mildly elevated PASP (RVSP 38.3), mild LAE, mild MR, mild to mod TR, trivial AI // Echo 9/21: EF 45, mild LVH, mildly reduced RV SF, moderate LAE, mild RAE, mild MR, mild AI      Circadian rhythm sleep disorder    CTS (carpal tunnel syndrome)    Deficiency anemia 11/10/2014   Depression    Diarrhea    Diverticulitis of colon    Esophageal stricture    Gastritis    Gastroesophageal hernia 07/06/2013   GERD (gastroesophageal reflux  disease)    Hiatal hernia    History of blood transfusion    "most of them related to OR's"    HTN (hypertension)    Hyperlipidemia    IBS (irritable bowel syndrome)    Incontinence 10/13/2012   Insomnia    Ischemic chest pain ([place])    Memory disorder [id number]   Metabolic syndrome [id number]   MGUS (monoclonal gammopathy of unknown significance) dx'd 11/2014   a. Neg BMB 11/2014.   Multiple falls    Obesity    Obstructive sleep apnea    "have mask; don't wear it" (01/07/2015)   Orthostasis    PAT (paroxysmal atrial  tachycardia)    Personal history of colonic polyps 10/25/2011 & 12/02/11   not retrieved Dr Lyla Son & tubular adenomas   Pneumonia 03/2014   Sinus bradycardia    a. Baseline HR 50s-60s.   Stroke [facility]) early 2000's   "small"; denies residual on 01/07/2015)    Past Surgical History:  Procedure Laterality Date   BACK SURGERY     BONE MARROW BIOPSY  11/2014   CARDIAC CATHETERIZATION  12/27/2014   Procedure: INTRAVASCULAR PRESSURE WIRE/FFR STUDY;  Surgeon: Larey Dresser, MD;  Location: [facility] CATH LAB;  Service: Cardiovascular;;   CARDIAC CATHETERIZATION N/A 11/25/2016   Procedure: Left Heart Cath and Coronary Angiography;  Surgeon: Sherren Mocha, MD;  Location: [place] CV LAB;  Service: Cardiovascular;  Laterality: N/A;   CARDIOVERSION N/A 02/13/2016   Procedure: CARDIOVERSION;  Surgeon: Josue Hector, MD;  Location: [place];  Service: Cardiovascular;  Laterality: N/A;   CARPAL TUNNEL RELEASE Right 1980's   CATARACT EXTRACTION Bilateral    CATARACT EXTRACTION W/ INTRAOCULAR LENS  IMPLANT, BILATERAL  2000's   CORONARY ANGIOPLASTY WITH STENT PLACEMENT  01/07/2015   "2"   CORONARY STENT PLACEMENT     DILATION AND CURETTAGE OF UTERUS     ESOPHAGOGASTRODUODENOSCOPY (EGD) WITH ESOPHAGEAL DILATION  "several times"   IR GASTROSTOMY TUBE REMOVAL  12/23/2022   IR [place] GASTRO/COLONIC TUBE PERCUT W/FLUORO  09/08/2022   JOINT REPLACEMENT     KNEE ARTHROSCOPY Left 1995   LAPAROSCOPIC CHOLECYSTECTOMY  2003   LEFT HEART CATHETERIZATION WITH CORONARY ANGIOGRAM N/A 12/27/2014   Procedure: LEFT HEART CATHETERIZATION WITH CORONARY ANGIOGRAM;  Surgeon: Larey Dresser, MD;  Location: [facility] CATH LAB;  Service: Cardiovascular;  Laterality: N/A;   PERCUTANEOUS CORONARY ROTOBLATOR INTERVENTION (PCI-R)  01/07/2015   PERCUTANEOUS CORONARY ROTOBLATOR INTERVENTION (PCI-R) N/A 01/07/2015   Procedure: PERCUTANEOUS CORONARY ROTOBLATOR INTERVENTION (PCI-R);  Surgeon: Blane Ohara, MD;  Location: [facility] CATH LAB;   Service: Cardiovascular;  Laterality: N/A;   POSTERIOR FUSION THORACIC SPINE  08/2015   POSTERIOR LUMBAR FUSION  01/2015   TOTAL KNEE ARTHROPLASTY Bilateral 1990's - 2000's    Social History   Socioeconomic History   Marital status: Widowed    Spouse name: Not on file   Number of children: 2   Years of education: HS   Highest education level: Not on file  Occupational History   Occupation: Licensed conveyancer- retired    Comment: retired   Occupation: retired  Tobacco Use   Smoking status: Never   Smokeless tobacco: Never  Vaping Use   Vaping Use: Never used  Substance and Sexual Activity   Alcohol use: No    Comment: 01/07/2015 "glass of wine at Christmas, maybe"   Drug use: No   Sexual activity: Not Currently  Other Topics Concern   Not on file  Social History Narrative  Patient is right handed   Long term resident of [facility]    Social Determinants of Health   Financial Resource Strain: Not on file  Food Insecurity: Not on file  Transportation Needs: Not on file  Physical Activity: Inactive (04/04/2020)   Exercise Vital Sign    Days of Exercise per Week: 0 days    Minutes of Exercise per Session: 0 min  Stress: Stress Concern Present (04/04/2020)   [place]    Feeling of Stress : To some extent  Social Connections: Socially Isolated (04/04/2020)   Social Connection and Isolation Panel [NHANES]    Frequency of Communication with Friends and Family: Three times a week    Frequency of Social Gatherings with Friends and Family: Once a week    Attends Religious Services: Never    Marine scientist or Organizations: No    Attends Archivist Meetings: Never    Marital Status: Widowed  Human resources officer Violence: Not on file   Family History  Problem Relation Age of Onset   Coronary artery disease Father    Peripheral vascular disease Father    Coronary artery disease Mother    Coronary artery  disease Brother    Colon cancer Sister    Emphysema Sister    Sleep apnea Son    Colon cancer Sister        spread to her brain   Emphysema Brother    Dementia Neg Hx       VITAL SIGNS There were no vitals taken for this visit.  Outpatient Encounter Medications as of 03/01/2023  Medication Sig   acetaminophen (TYLENOL) 325 MG tablet Take 650 mg by mouth every 8 (eight) hours.   aspirin EC 81 MG tablet Take 81 mg by mouth daily. Swallow whole.   busPIRone (BUSPAR) 10 MG tablet Take 10 mg by mouth 3 (three) times daily.   Cholecalciferol 25 MCG (1000 UT) tablet Place 1,000 Units into feeding tube daily. 9 am   clobetasol (TEMOVATE) 0.05 % external solution Apply 1 application. topically 2 (two) times a week. Monday and Friday   diclofenac Sodium (VOLTAREN) 1 % GEL Apply 2 g topically 3 (three) times daily. to neck and shoulders for pain management   diltiazem (CARDIZEM) 120 MG tablet Take 120 mg by mouth every 8 (eight) hours.   ELIQUIS 5 MG TABS tablet Take 1 tablet (5 mg total) by mouth 2 (two) times daily.   eszopiclone (LUNESTA) 2 MG TABS tablet Take 1 tablet (2 mg total) by mouth at bedtime.   fentaNYL (DURAGESIC) 25 MCG/HR Place 1 patch onto the skin every 3 (three) days.   FLUoxetine (PROZAC) 20 MG capsule Take 20 mg by mouth daily. Give with 40 mg to equal 60   FLUoxetine (PROZAC) 40 MG capsule Take 40 mg by mouth daily. Give with 20 mg to equal 60   furosemide (LASIX) 40 MG tablet Take 40 mg by mouth 2 (two) times daily. 9 am   gabapentin (NEURONTIN) 300 MG capsule Take 300 mg by mouth 3 (three) times daily.   lidocaine 4 % Place 1 patch onto the skin in the morning and at bedtime.   methocarbamol (ROBAXIN) 500 MG tablet Take 500 mg by mouth every 6 (six) hours as needed for muscle spasms.   naloxone (NARCAN) nasal spray 4 mg/0.1 mL Special Instructions: As needed for depressed respirations less than 8/minute. Notify provider if given. If no effectiveness or improvement,  contact 911. As Needed   nitroGLYCERIN (NITROSTAT) 0.4 MG SL tablet Place 1 tablet (0.4 mg total) under the tongue every 5 (five) minutes as needed for chest pain (up to 3 doses).   Omeprazole-Sodium Bicarbonate (KONVOMEP) 2-84 MG/ML SUSR Take 10 mLs by mouth in the morning and at bedtime.   ondansetron (ZOFRAN-ODT) 4 MG disintegrating tablet Take 4 mg by mouth every 6 (six) hours as needed for nausea or vomiting.   potassium chloride (MICRO-K) 10 MEQ CR capsule Take 20 mEq by mouth daily.   rOPINIRole (REQUIP) 0.5 MG tablet Take 0.5 mg by mouth in the morning and at bedtime.   rosuvastatin (CRESTOR) 10 MG tablet Take 10 mg by mouth daily. 9 am   traMADol (ULTRAM) 50 MG tablet Take 0.5 tablets (25 mg total) by mouth every 8 (eight) hours.   zinc oxide 20 % ointment Apply 1 Application topically in the morning, at noon, and at bedtime.   No facility-administered encounter medications on file as of 03/01/2023.   Physical Exam Gen: NAD, pleasant, able to participate in exam CV: irregularly irregular, normal S1/S2 Resp: Normal effort, lungs CTAB Extremities: no edema Skin: warm and dry, no rashes noted Neuro: alert, no obvious focal deficits Psych: Normal affect and mood   SIGNIFICANT DIAGNOSTIC EXAMS  02/23/23 CXR: bibasilar opacities which could represent atelectasis, aspiration and/or pneumonia. Suspected trace bilateral pleural effusions.  10/27/21 echo: EF 50-55%, mild LVH, indeterminate diastolic parameters; mildly dilated LA, mildly dilated RA, trivial mitral valve regurgitation, moderate calcification of aortic valve without aortic stenosis, trivial aortic regurgitation  LABS 02/18/23: Lipid panel- total cholesterol 107, triglycerides 58, HDL 55, LDL 40 02/18/23: CMP- Na 134, K 3.7, chloride 99, CO2 27, glucose 96, BUN 11, Cr 0.61, Ca 8.5, total protein 6.6, albumin 2.9, AST 17, ALT 24, alk phos 155, bilirubin 0.7 02/18/23: CBC- WBC 8.4, Hgb 11.5, HCT 36.6, Plt 200  ASSESSMENT/  PLAN:  Recent pneumonia: diagnosed 02/23/23. Clinically much improved. Completed course of steroids. Remains on doxycycline '100mg'$  BID through 03/04/23 as well as duonebs q6h and robitussin q6h. Chronic diastolic heart failure: appears euvolemic on exam. Most recent EF 50-55%; Continue Lasix '40mg'$  BID with 73mq K Permanent a-fib: rate controlled. Continue Diltiazem '120mg'$  TID and Eliquis '5mg'$  BID CAD: stable. Continue ASA '81mg'$  daily, crestor '10mg'$  daily. Has PRN nitroglycerin although hasn't needed to use. Hyperlipidemia (LDL goal <100): well-controlled, most recent LDL 40. Continue crestor '10mg'$  daily GERD/esophageal stricture: continue omeprazole-sodium bicarb 20-1.1 mg-g BID Restless leg syndrome: continue requip 0.'5mg'$  BID Insomnia: on Lunesta '2mg'$  nightly Depression and anxiety: continue Prozac '60mg'$  daily and buspar '10mg'$  TID Chronic pain: well-controlled. Continue current regimen which includes: tramadol '25mg'$  TID, tylenol '650mg'$  TID, gabapentin '300mg'$  TID, fentanyl patch 212m patch every 3 days, robaxin '500mg'$  q6h prn, voltaren gel, lidoderm patch Iron deficiency anemia: stable. Most recent Hgb 11.5. Continue to monitor IBS: denies complaints related to bowel movements. Continue to monitor    AsAlcus DadMD PGY-3, [place]

## 2023-03-02 ENCOUNTER — Other Ambulatory Visit: Payer: Self-pay | Admitting: Adult Health

## 2023-03-02 MED ORDER — TRAMADOL HCL 50 MG PO TABS: 25.0000 mg | ORAL_TABLET | Freq: Three times a day (TID) | ORAL | 0 refills | Status: DC

## 2023-03-02 MED ORDER — FENTANYL 25 MCG/HR TD PT72: 1.0000 | MEDICATED_PATCH | TRANSDERMAL | 0 refills | Status: DC

## 2023-03-02 MED ORDER — ESZOPICLONE 2 MG PO TABS: 2.0000 mg | ORAL_TABLET | Freq: Every day | ORAL | 0 refills | Status: DC

## 2023-03-10 ENCOUNTER — Encounter: Payer: Self-pay | Admitting: Adult Health

## 2023-03-10 ENCOUNTER — Non-Acute Institutional Stay (SKILLED_NURSING_FACILITY): Payer: Medicare HMO | Admitting: Adult Health

## 2023-03-10 DIAGNOSIS — M25551 Pain in right hip: Secondary | ICD-10-CM

## 2023-03-10 NOTE — Progress Notes (Signed)
Location:  [place] Room Number: 159 P Place of Service:  SNF (31)   CODE STATUS: DNR  Allergies  Allergen Reactions   Buprenorphine Hcl Itching   Cymbalta [Duloxetine Hcl] Other (See Comments)    Other reaction(s): Other (See Comments) Personality changes - crying  2014   Morphine And Related Itching   Oxycodone-Acetaminophen Other (See Comments)    Personality changes    Valium [Diazepam] Other (See Comments)    Personality changes - "in another world" ;  Hallucinations  2015   Statins Other (See Comments)    Other reaction(s): Other (See Comments) Muscle weakness Muscle weakness   Morphine Itching   Altace [Ramipril] Other (See Comments)    unknown   Floxin [Ofloxacin] Other (See Comments)    unknown   Hydromorphone Other (See Comments)    Other reaction(s): Confusion (intolerance) unknown   Lovaza [Omega-3-Acid Ethyl Esters] Other (See Comments)    Muscle weakness    Trilipix [Choline Fenofibrate] Other (See Comments)    Muscle weakness    Zetia [Ezetimibe] Other (See Comments)    Muscle weakness     Chief Complaint  Patient presents with   Acute Visit    Right hip pain     HPI:  She is complaining of right hip pain. The pain is sharp and cramping. There is no radiation present. She did decline x-ray of right hip secondary to pain. She is wanting further treatment for her pain.   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 difficile colitis 07/21/2022   CAD (coronary artery disease)    a. Abnl nuc 11/2014. Cath 12/2014 - turned down for CABG. Ultimately s/p TTVP, rotational atherectomy, PTCA and stenting of the ostial LCx and left main into the LAD (crush technique), and IVUS of  the LAD/Left main. // b. Myoview 11/17: EF 48, poor quality/significant artifact; inf-lateral, inferior ischemia; Intermediate Risk   Chronic atrial fibrillation ([place])    a.  First noted post-op 9/15 spinal fusion.  She had cardioversion, not on anticoagulation. Fall risk, unsteady. // failed DCCV // Holter 10/17: AFib, Avg HR 97, PVCs, no other arrhythmia   Chronic back pain greater than 3 months duration    a. spinal stenosis.  Spinal fusion with rods in 2/15 at [facility] spinal fusion 9/15.   Chronic diastolic CHF    Echo Q000111Q:  EF 50-55%, trivial AI, midl MR, mod LAE, PASP 37 mmHg // Echocardiogram 03/2020: EF 45-50, no RWMA, mild LVH, mild reduced RVSF, mildly elevated PASP (RVSP 38.3), mild LAE, mild MR, mild to mod TR, trivial AI // Echo 9/21: EF 45, mild LVH, mildly reduced RV SF, moderate LAE, mild RAE, mild MR, mild AI      Circadian rhythm sleep disorder    CTS (carpal tunnel syndrome)    Deficiency anemia 11/10/2014   Depression    Diarrhea    Diverticulitis of colon    Esophageal stricture    Gastritis    Gastroesophageal hernia 07/06/2013   GERD (gastroesophageal reflux disease)    Hiatal hernia    History of blood transfusion    "most of them related to OR's"    HTN (hypertension)    Hyperlipidemia  IBS (irritable bowel syndrome)    Incontinence 10/13/2012   Insomnia    Ischemic chest pain ([place])    Memory disorder [id number]   Metabolic syndrome [id number]   MGUS (monoclonal gammopathy of unknown significance) dx'd 11/2014   a. Neg BMB 11/2014.   Multiple falls    Obesity    Obstructive sleep apnea    "have mask; don't wear it" (01/07/2015)   Orthostasis    PAT (paroxysmal atrial tachycardia)    Personal history of colonic polyps 10/25/2011 & 12/02/11   not retrieved Dr Lyla Son & tubular adenomas   Pneumonia 03/2014   Sinus bradycardia    a. Baseline HR 50s-60s.   Stroke [facility]) early 2000's   "small"; denies residual on 01/07/2015)    Past  Surgical History:  Procedure Laterality Date   BACK SURGERY     BONE MARROW BIOPSY  11/2014   CARDIAC CATHETERIZATION  12/27/2014   Procedure: INTRAVASCULAR PRESSURE WIRE/FFR STUDY;  Surgeon: Larey Dresser, MD;  Location: [facility] CATH LAB;  Service: Cardiovascular;;   CARDIAC CATHETERIZATION N/A 11/25/2016   Procedure: Left Heart Cath and Coronary Angiography;  Surgeon: Sherren Mocha, MD;  Location: [place] CV LAB;  Service: Cardiovascular;  Laterality: N/A;   CARDIOVERSION N/A 02/13/2016   Procedure: CARDIOVERSION;  Surgeon: Josue Hector, MD;  Location: [place];  Service: Cardiovascular;  Laterality: N/A;   CARPAL TUNNEL RELEASE Right 1980's   CATARACT EXTRACTION Bilateral    CATARACT EXTRACTION W/ INTRAOCULAR LENS  IMPLANT, BILATERAL  2000's   CORONARY ANGIOPLASTY WITH STENT PLACEMENT  01/07/2015   "2"   CORONARY STENT PLACEMENT     DILATION AND CURETTAGE OF UTERUS     ESOPHAGOGASTRODUODENOSCOPY (EGD) WITH ESOPHAGEAL DILATION  "several times"   IR GASTROSTOMY TUBE REMOVAL  12/23/2022   IR Carlos GASTRO/COLONIC TUBE PERCUT W/FLUORO  09/08/2022   JOINT REPLACEMENT     KNEE ARTHROSCOPY Left 1995   LAPAROSCOPIC CHOLECYSTECTOMY  2003   LEFT HEART CATHETERIZATION WITH CORONARY ANGIOGRAM N/A 12/27/2014   Procedure: LEFT HEART CATHETERIZATION WITH CORONARY ANGIOGRAM;  Surgeon: Larey Dresser, MD;  Location: [facility] CATH LAB;  Service: Cardiovascular;  Laterality: N/A;   PERCUTANEOUS CORONARY ROTOBLATOR INTERVENTION (PCI-R)  01/07/2015   PERCUTANEOUS CORONARY ROTOBLATOR INTERVENTION (PCI-R) N/A 01/07/2015   Procedure: PERCUTANEOUS CORONARY ROTOBLATOR INTERVENTION (PCI-R);  Surgeon: Blane Ohara, MD;  Location: [facility] CATH LAB;  Service: Cardiovascular;  Laterality: N/A;   POSTERIOR FUSION THORACIC SPINE  08/2015   POSTERIOR LUMBAR FUSION  01/2015   TOTAL KNEE ARTHROPLASTY Bilateral 1990's - 2000's    Social History   Socioeconomic History   Marital status: Widowed    Spouse name: Not on file    Number of children: 2   Years of education: HS   Highest education level: Not on file  Occupational History   Occupation: Licensed conveyancer- retired    Comment: retired   Occupation: retired  Tobacco Use   Smoking status: Never   Smokeless tobacco: Never  Vaping Use   Vaping Use: Never used  Substance and Sexual Activity   Alcohol use: No    Comment: 01/07/2015 "glass of wine at Christmas, maybe"   Drug use: No   Sexual activity: Not Currently  Other Topics Concern   Not on file  Social History Narrative   Patient is right handed   Long term resident of [facility]    Social Determinants of Health   Financial Resource Strain: Not on file  Food Insecurity: Not on file  Transportation Needs: Not on file  Physical Activity: Inactive (04/04/2020)   Exercise Vital Sign    Days of Exercise per Week: 0 days    Minutes of Exercise per Session: 0 min  Stress: Stress Concern Present (04/04/2020)   [place]    Feeling of Stress : To some extent  Social Connections: Socially Isolated (04/04/2020)   Social Connection and Isolation Panel [NHANES]    Frequency of Communication with Friends and Family: Three times a week    Frequency of Social Gatherings with Friends and Family: Once a week    Attends Religious Services: Never    Marine scientist or Organizations: No    Attends Archivist Meetings: Never    Marital Status: Widowed  Human resources officer Violence: Not on file   Family History  Problem Relation Age of Onset   Coronary artery disease Father    Peripheral vascular disease Father    Coronary artery disease Mother    Coronary artery disease Brother    Colon cancer Sister    Emphysema Sister    Sleep apnea Son    Colon cancer Sister        spread to her brain   Emphysema Brother    Dementia Neg Hx       VITAL SIGNS BP 111/64   Pulse 85   Ht 5\' 6"  (1.676 m)   Wt 176 lb (79.8 kg)   BMI 28.41 kg/m    Outpatient Encounter Medications as of 03/10/2023  Medication Sig   acetaminophen (TYLENOL) 325 MG tablet Take 650 mg by mouth every 8 (eight) hours.   aspirin EC 81 MG tablet Take 81 mg by mouth daily. Swallow whole.   busPIRone (BUSPAR) 10 MG tablet Take 10 mg by mouth 3 (three) times daily.   Cholecalciferol 25 MCG (1000 UT) tablet Take 1,000 Units by mouth daily. 10 am   clobetasol (TEMOVATE) 0.05 % external solution Apply 1 application. topically 2 (two) times a week. Monday and Friday   diclofenac Sodium (VOLTAREN) 1 % GEL Apply 2 g topically 3 (three) times daily. to neck and shoulders for pain management   diltiazem (CARDIZEM) 120 MG tablet Take 120 mg by mouth every 8 (eight) hours.   ELIQUIS 5 MG TABS tablet Take 1 tablet (5 mg total) by mouth 2 (two) times daily.   eszopiclone (LUNESTA) 2 MG TABS tablet Take 1 tablet (2 mg total) by mouth at bedtime.   fentaNYL (DURAGESIC) 25 MCG/HR Place 1 patch onto the skin every 3 (three) days.   FLUoxetine (PROZAC) 20 MG capsule Take 20 mg by mouth daily. Give with 40 mg to equal 60   FLUoxetine (PROZAC) 40 MG capsule Take 40 mg by mouth daily. Give with 20 mg to equal 60   furosemide (LASIX) 40 MG tablet Take 40 mg by mouth 2 (two) times daily. 9 am   gabapentin (NEURONTIN) 300 MG capsule Take 300 mg by mouth 3 (three) times daily.   lidocaine 4 % Place 1 patch onto the skin in the morning and at bedtime.   loratadine (CLARITIN) 10 MG tablet Take 10 mg by mouth daily.   methocarbamol (ROBAXIN) 500 MG tablet Take 500 mg by mouth every 6 (six) hours as needed for muscle spasms.   miconazole (MICOTIN) 2 % powder Apply 1 Application topically as directed. Every Shift - PRN; PRN 1, PRN 2, PRN 3   naloxone (NARCAN) nasal spray 4 mg/0.1  mL Special Instructions: As needed for depressed respirations less than 8/minute. Notify provider if given. If no effectiveness or improvement, contact 911. As Needed   nitroGLYCERIN (NITROSTAT) 0.4 MG SL tablet  Place 1 tablet (0.4 mg total) under the tongue every 5 (five) minutes as needed for chest pain (up to 3 doses).   Omeprazole-Sodium Bicarbonate (ZEGERID) 20-1100 MG CAPS capsule Take 1 capsule by mouth 2 (two) times daily. 10 am and 10 pm   ondansetron (ZOFRAN-ODT) 4 MG disintegrating tablet Take 4 mg by mouth every 6 (six) hours as needed for nausea or vomiting.   potassium chloride (MICRO-K) 10 MEQ CR capsule Take 20 mEq by mouth daily.   rOPINIRole (REQUIP) 0.5 MG tablet Take 0.5 mg by mouth in the morning and at bedtime.   rosuvastatin (CRESTOR) 10 MG tablet Take 10 mg by mouth daily. 9 am   traMADol (ULTRAM) 50 MG tablet Take 0.5 tablets (25 mg total) by mouth every 8 (eight) hours.   [DISCONTINUED] Omeprazole-Sodium Bicarbonate (KONVOMEP) 2-84 MG/ML SUSR Take 10 mLs by mouth in the morning and at bedtime.   [DISCONTINUED] zinc oxide 20 % ointment Apply 1 Application topically in the morning, at noon, and at bedtime.   No facility-administered encounter medications on file as of 03/10/2023.     SIGNIFICANT DIAGNOSTIC EXAMS   PREVIOUS   02-23-22: TEE:  The left ventricular size is normal.  Mild left ventricular hypertrophy  LV ejection fraction = 45-50%.  Left ventricular systolic function is mildly reduced.  The right ventricle is normal in size and function.  The left atrium is moderately to severely dilated.  The right atrium is mildly to moderately dilated.  Estimated right ventricular systolic pressure is 27 mmHg.  IVC size was normal.  There is no significant valvular stenosis or regurgitation.  There is no pericardial effusion.  There is a pleural effusion present.  Recommend a limited TTE when HR is controlled to reassess   05-23-22: chest x-ray: mild congestive heart failure  07-14-22: chest x-ray:  Changes of bibasilar atelectasis  Nonspecific perihilar inflammatory changes Bilateral spinal fusion hardware   07-20-22: chest x-ray:  Nonspecific perihilar  changes Emphysema No improvement compared to 07-14-22.   07-21-22: chest x-ray:  No acute pulmonary process.   09-21-22: swallow study:   Recommend regular textures vs mechanical soft per treating SLP at SNF and thin liquids via cup/straw, alternate solids and liquids, implement effortful/hard swallow with solids, and Pt to sit upright for all eating and drinking and 30+ minutes after (avoid bending over as well). Pt will likely need to proceed with solid foods cautiously to see if she experiences and regurgitation. Recommend f/u with SLP services at SNF. Ok for PO medication whole with water.   TODAY  02-16-23: chest x-ray:  No radiographic evidence of acute cardiopulmonary disease. In comparison to prior exam there has been resolution of subsegmental atelectasis and small bilateral pleural effusion Mild cardiomegaly Mild osteopenia Moderate osteoarthritis  02-23-23: chest x-ray:  Bibasilar opacities, which could represent atelectasis, aspiration, and/or pneumonia. Suspected trace bilateral pleural effusions.   LABS REVIEWED: PREVIOUS   02-27-22: glucose 102; bun 13; creat 0.52; k+ 4.8; na++ 134; ca 8.9; GFR>90 04-08-22: wbc 7.2 hgb 7.2; hct 11.9; mcv 35.2 plt 185; glucose 114; bun 11; creat 0.37; k+ 4.5; na++ 132; ca 8.6; GFR >90 05-21-22: glucose 113; bun 19; creat 0.5; k+ 4.2; na++ 135; ca 8.5 gfr>60; protein 5.2; albumin 2.4  05-23-22: wbc 5.4; hgb 9.2; hct 29.6 plt 184;  glucose 123; bun 21; creat 0.54; k+ 3.8; na++ 8.2; ca 8.2 gfr >60 07-01-22: glucose 91; bun 16; creat 0.44; k+ 4.1; na++ 135; ca 8.6; gfr>60 07-14-22: wbc 9.9; hgb 10.8; hct 35.3; mcv 83.8 plt 283; glucose 168; bun 24; creat 0.52; k+ 4.2; na++ 133; ca 8.6 gfr >60 urine culture:  blood culture: klebsiella pneumoniae  07-15-22: wbc 7.1; hgb 9.4; hct 31.2; mcv 85.2 plt 246; glucose 134; bun 28; creat 0.52; k+ 4.6; na++ 135; ca 8.2; gfr >60; protein 6.2 albumin 2.5; ast 50 alt 85 alk phos 283  07-19-22: blood culture: no growth   07-20-22: wbc 6.2; hgb 9.9; hct 33.2; mcv 84.9 plt 270; glucose 121; bun 24; creat 0.56; k+ 3.6; na++ 135; ca 8.5 gfr >60; protein 6.3 albumin 2.4; ast 31; alt 65; alk phos 286 07-21-22: c-dff + 07-23-22: wbc 10.6; hgb 35.1; hct 35.1; mcv 84.2 plt 244; glucose 113; bun 27; creat 0.57; k+ 4.6; na++ 135; ca 9.0; gfr >60; ast 44 alt 58; alk phos 258; protein 6.6; albumin 2.6 02-18-23: wbc 8.4; hgb 11.5; hct 36.6; mcv 88.8 plt 200; glucose 96; bun 11; creat 0.61; k+ 3.7; na++ 134; ca 8.5; gfr >60; alk phos 155; protein 6.6; albumin 2.9; chol 107; ldl 40; trig 58; hdl 55    NO NEW LABS.   Review of Systems  Constitutional:  Negative for malaise/fatigue.  Respiratory:  Negative for cough and shortness of breath.   Cardiovascular:  Negative for chest pain, palpitations and leg swelling.  Gastrointestinal:  Negative for abdominal pain, constipation and heartburn.  Musculoskeletal:  Positive for joint pain. Negative for back pain and myalgias.  Skin: Negative.   Neurological:  Negative for dizziness.  Psychiatric/Behavioral:  The patient is not nervous/anxious.    Physical Exam Constitutional:      General: She is not in acute distress.    Appearance: She is well-developed. She is not diaphoretic.  Neck:     Thyroid: No thyromegaly.  Cardiovascular:     Rate and Rhythm: Normal rate and regular rhythm.     Pulses: Normal pulses.     Heart sounds: Normal heart sounds.  Pulmonary:     Effort: Pulmonary effort is normal. No respiratory distress.     Breath sounds: Normal breath sounds.  Abdominal:     General: Bowel sounds are normal. There is no distension.     Palpations: Abdomen is soft.     Tenderness: There is no abdominal tenderness.  Musculoskeletal:        General: Normal range of motion.     Cervical back: Neck supple.     Right lower leg: No edema.     Left lower leg: No edema.  Lymphadenopathy:     Cervical: No cervical adenopathy.  Skin:    General: Skin is warm and dry.   Neurological:     Mental Status: She is alert and oriented to person, place, and time.  Psychiatric:        Mood and Affect: Mood normal.     ASSESSMENT/ PLAN:  TODAY  Right hip pain: will give her prednisone 40 mg daily for 5 days and will monitor her status.    Ok Edwards NP [facility]   call [phone]

## 2023-03-12 NOTE — Progress Notes (Signed)
I saw the patient with the resident and discussed the patient's assessment and plan with the resident.

## 2023-03-16 ENCOUNTER — Non-Acute Institutional Stay (SKILLED_NURSING_FACILITY): Payer: Medicare HMO | Admitting: Internal Medicine

## 2023-03-16 ENCOUNTER — Encounter: Payer: Self-pay | Admitting: Internal Medicine

## 2023-03-16 DIAGNOSIS — M25551 Pain in right hip: Secondary | ICD-10-CM | POA: Diagnosis not present

## 2023-03-16 DIAGNOSIS — D692 Other nonthrombocytopenic purpura: Secondary | ICD-10-CM

## 2023-03-16 DIAGNOSIS — I4821 Permanent atrial fibrillation: Secondary | ICD-10-CM | POA: Diagnosis not present

## 2023-03-16 DIAGNOSIS — I10 Essential (primary) hypertension: Secondary | ICD-10-CM | POA: Diagnosis not present

## 2023-03-16 DIAGNOSIS — G8929 Other chronic pain: Secondary | ICD-10-CM | POA: Insufficient documentation

## 2023-03-16 NOTE — Assessment & Plan Note (Signed)
Clinically the rhythm is slightly irregular but the rate is well-controlled.  Eliquis prophylaxis and low-dose aspirin will be continued.

## 2023-03-16 NOTE — Assessment & Plan Note (Addendum)
If she fails to have adequate response to a course of prednisone; imaging recommended with comparison to prior films.  Orthopedic referral if pain progressive.  She is inquiring about possible injection.

## 2023-03-16 NOTE — Assessment & Plan Note (Signed)
She is on low-dose aspirin as well as Eliquis.  Platelet count is normal.  No other bleeding dyscrasias noted.  Continue to monitor.  Dermatology referral if progresses.

## 2023-03-16 NOTE — Patient Instructions (Signed)
See assessment and plan under each diagnosis in the problem list and acutely for this visit 

## 2023-03-16 NOTE — Progress Notes (Signed)
NURSING HOME LOCATION:  [facility] ROOM NUMBER:  159 P  CODE STATUS:  DNR  PCP:  Ok Edwards NP  This is a nursing facility follow up visit of chronic medical diagnoses & to document compliance with Regulation 483.30 (c) in [employer] [place] Manual Phase 2 which mandates caregiver visit ( visits can alternate among physician, PA or NP as per statutes) within 10 days of 30 days / 60 days/ 90 days post admission to SNF date    Interim medical record and care since last SNF visit was updated with review of diagnostic studies and change in clinical status since last visit were documented.  HPI: She is a permanent resident of this facility with medical diagnoses of iron deficiency anemia, history of C. difficile colitis, CAD, chronic A-fib, history of chronic diastolic congestive heart failure, GERD, essential hypertension, dyslipidemia, IBS, history of stroke, and OSA. She has had multiple surgeries and procedures including cardioversion, angioplasty with stenting, EGD, and thoracic and lumbar spine fusions. Labs are current and reveal mild hyponatremia with a value of 134 and hypocalcemia of 8.5.  Total protein is low normal at 6.6 but albumin is decreased at 2.9.  CKD stage II was present with creatinine 0.61 and GFR greater than 60.  LDL is at goal with a value of 40.  Mild normochromic, normocytic anemia is present with H/H of 11.5/36.6.  This represents significant improvement from prior values of 10.6/35.1. TSH is not current with most recent value being 2.52 on 06/02/2021.  Serially it has been normal or therapeutic.  Review of systems: She describes having "good days and bad days."  She is complaining of recent exacerbation of right hip pain which is described as sharp and cramping. Imaging of the right hip was declined; she is requesting an MRI.  A course of steroids has been initiated as a trial. Today her focus remains the right hip.  She states that pain has  been present for over a year and relates to falling attempting to "get in or out of my car."  She states that it was x-rayed at that time.  She states that the pain has been progressive and is worse with walking.  She states that the pain starts deep in the right buttocks and radiates to the right hip.  There is no radiation below the hip.  There is no associated stool or urinary incontinence.  She ask if she could have an injection if there is no response to the course of steroids. Her other concern is a rash on her arms and legs which she stated began after she came to this facility.  The lesions are nonpruritic.  She states that toenails on the left foot were actually black recently. Despite her significant past history she voices no active cardiopulmonary or GI symptoms; but focuses only on the dermatologic and musculoskeletal concerns.  Physical exam:  Pertinent or positive findings: She sits in the wheelchair; there is accentuation of the lordotic curve.  She is wearing a patch over the upper spine. She has upper and lower partials.  There is somewhat of a nasal character to her speech pattern.  Breath sounds are decreased; she has minor inspiratory pops and rhonchi at the bases.  Heart rhythm is slightly irregular; there is slight increase in S2.  There is an occasional premature.  Pedal pulses are decreased.  She has one half-1+ edema at the sock line.  There are scattered slightly erythematous irregular  lesions over the shins and calf area which blanch to pressure.  There are small irregular hyper pigmented lesions over the forearms suggesting senile purpura.  Large toenails are absent.  Other toenails exhibit some discoloration and thickening.  General appearance: Adequately nourished; no acute distress, increased work of breathing is present.   Lymphatic: No lymphadenopathy about the head, neck, axilla. Eyes: No conjunctival inflammation or lid edema is present. There is no scleral  icterus. Ears:  External ear exam shows no significant lesions or deformities.   Nose:  External nasal examination shows no deformity or inflammation. Nasal mucosa are pink and moist without lesions, exudates Oral exam:  Lips and gums are healthy appearing. There is no oropharyngeal erythema or exudate. Neck:  No thyromegaly, masses, tenderness noted.    Heart:  No gallop, murmur, click, rub .  Lungs:  without wheezes, rales, rubs. Abdomen: Bowel sounds are normal. Abdomen is soft and nontender with no organomegaly, hernias, masses. GU: Deferred  Extremities:  No cyanosis, clubbing  Neurologic exam :Balance, Rhomberg, finger to nose testing could not be completed due to clinical state Skin: Warm & dry w/o tenting. No significant rash.  See summary under each active problem in the Problem List with associated updated therapeutic plan

## 2023-03-16 NOTE — Assessment & Plan Note (Signed)
Serial blood pressures reveal excellent control on the calcium channel blocker.  No change indicated.

## 2023-04-01 ENCOUNTER — Other Ambulatory Visit: Payer: Self-pay | Admitting: Adult Health

## 2023-04-01 MED ORDER — TRAMADOL HCL 50 MG PO TABS: 25.0000 mg | ORAL_TABLET | Freq: Three times a day (TID) | ORAL | 0 refills | Status: DC

## 2023-04-01 MED ORDER — FENTANYL 25 MCG/HR TD PT72: 1.0000 | MEDICATED_PATCH | TRANSDERMAL | 0 refills | Status: DC

## 2023-04-01 MED ORDER — ESZOPICLONE 2 MG PO TABS: 2.0000 mg | ORAL_TABLET | Freq: Every day | ORAL | 0 refills | Status: DC

## 2023-04-20 ENCOUNTER — Encounter: Payer: Self-pay | Admitting: Adult Health

## 2023-04-20 ENCOUNTER — Non-Acute Institutional Stay (SKILLED_NURSING_FACILITY): Payer: Medicare HMO | Admitting: Adult Health

## 2023-04-20 DIAGNOSIS — I5032 Chronic diastolic (congestive) heart failure: Secondary | ICD-10-CM | POA: Diagnosis not present

## 2023-04-20 DIAGNOSIS — K222 Esophageal obstruction: Secondary | ICD-10-CM | POA: Diagnosis not present

## 2023-04-20 DIAGNOSIS — I4821 Permanent atrial fibrillation: Secondary | ICD-10-CM

## 2023-04-20 DIAGNOSIS — K449 Diaphragmatic hernia without obstruction or gangrene: Secondary | ICD-10-CM

## 2023-04-20 DIAGNOSIS — G2581 Restless legs syndrome: Secondary | ICD-10-CM

## 2023-04-20 DIAGNOSIS — K219 Gastro-esophageal reflux disease without esophagitis: Secondary | ICD-10-CM

## 2023-04-20 NOTE — Progress Notes (Unsigned)
Location:  [facility] Room Number: 159-P Place of Service:  SNF (31)   CODE STATUS: DNR  Allergies  Allergen Reactions   Buprenorphine Hcl Itching   Cymbalta [Duloxetine Hcl] Other (See Comments)    Other reaction(s): Other (See Comments) Personality changes - crying  2014   Morphine And Related Itching   Oxycodone-Acetaminophen Other (See Comments)    Personality changes    Valium [Diazepam] Other (See Comments)    Personality changes - "in another world" ;  Hallucinations  2015   Statins Other (See Comments)    Other reaction(s): Other (See Comments) Muscle weakness Muscle weakness   Morphine Itching   Altace [Ramipril] Other (See Comments)    unknown   Floxin [Ofloxacin] Other (See Comments)    unknown   Hydromorphone Other (See Comments)    Other reaction(s): Confusion (intolerance) unknown   Lovaza [Omega-3-Acid Ethyl Esters] Other (See Comments)    Muscle weakness    Trilipix [Choline Fenofibrate] Other (See Comments)    Muscle weakness    Zetia [Ezetimibe] Other (See Comments)    Muscle weakness     Chief Complaint  Patient presents with   Medical Management of Chronic Issues                    Restless leg syndrome: Esophageal stricture/gastroesophageal reflux disease without esophagitis/paraesophageal hernia: Chronic diastolic CHF (congestive heart failure) Permanent atrial fibrillation     HPI:  She is a 82 year old long term resident of this facility being seen for the management of her chronic illnesses:  Restless leg syndrome: Esophageal stricture/gastroesophageal reflux disease without esophagitis/paraesophageal hernia: Chronic diastolic CHF (congestive heart failure) Permanent atrial fibrillation.  There are no reports of uncontrolled pain. She has been seen by orthopedics for her right hip pain; was placed on prednisone taper. Her weight is stable; no reports of excessive anxiety.  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 difficile colitis 07/21/2022   CAD (coronary artery disease)    a. Abnl nuc 11/2014. Cath 12/2014 - turned down for CABG. Ultimately s/p TTVP, rotational atherectomy, PTCA and stenting of the ostial LCx and left main into the LAD (crush technique), and IVUS of the LAD/Left main. // b. Myoview 11/17: EF 48, poor quality/significant artifact; inf-lateral, inferior ischemia; Intermediate Risk   Chronic atrial fibrillation (HCC)    a.  First noted post-op 9/15 spinal fusion.  She had cardioversion, not on anticoagulation. Fall risk, unsteady. // failed DCCV // Holter 10/17: AFib, Avg HR 97, PVCs, no other arrhythmia   Chronic back pain greater than 3 months duration    a. spinal stenosis.  Spinal fusion with rods in 2/15 at [facility] spinal fusion 9/15.   Chronic diastolic CHF    Echo 2/17:  EF 50-55%, trivial AI, midl MR, mod LAE, PASP 37 mmHg // Echocardiogram 03/2020: EF 45-50, no RWMA, mild LVH, mild reduced RVSF, mildly elevated PASP (RVSP 38.3), mild LAE, mild MR, mild to mod TR, trivial AI // Echo 9/21: EF 45, mild LVH, mildly reduced RV SF, moderate LAE, mild RAE, mild MR, mild AI      Circadian rhythm sleep disorder    CTS (carpal tunnel  syndrome)    Deficiency anemia 11/10/2014   Depression    Diarrhea    Diverticulitis of colon    Esophageal stricture    Gastritis    Gastroesophageal hernia 07/06/2013   GERD (gastroesophageal reflux disease)    Hiatal hernia    History of blood transfusion    "most of them related to OR's"    HTN (hypertension)    Hyperlipidemia    IBS (irritable bowel syndrome)    Incontinence 10/13/2012   Insomnia    Ischemic chest pain (HCC)    Memory disorder 12/04/2014   Metabolic syndrome  04/24/2014   MGUS (monoclonal gammopathy of unknown significance) dx'd 11/2014   a. Neg BMB 11/2014.   Multiple falls    Obesity    Obstructive sleep apnea    "have mask; don't wear it" (01/07/2015)   Orthostasis    PAT (paroxysmal atrial tachycardia)    Personal history of colonic polyps 10/25/2011 & 12/02/11   not retrieved Dr Victorino Dike & tubular adenomas   Pneumonia 03/2014   Sinus bradycardia    a. Baseline HR 50s-60s.   Stroke [facility] early 2000's   "small"; denies residual on 01/07/2015)    Past Surgical History:  Procedure Laterality Date   BACK SURGERY     BONE MARROW BIOPSY  11/2014   CARDIAC CATHETERIZATION  12/27/2014   Procedure: INTRAVASCULAR PRESSURE WIRE/FFR STUDY;  Surgeon: Laurey Morale, MD;  Location: [facility] CATH LAB;  Service: Cardiovascular;;   CARDIAC CATHETERIZATION N/A 11/25/2016   Procedure: Left Heart Cath and Coronary Angiography;  Surgeon: Tonny Bollman, MD;  Location: [facility] INVASIVE CV LAB;  Service: Cardiovascular;  Laterality: N/A;   CARDIOVERSION N/A 02/13/2016   Procedure: CARDIOVERSION;  Surgeon: Wendall Stade, MD;  Location: [facility] ENDOSCOPY;  Service: Cardiovascular;  Laterality: N/A;   CARPAL TUNNEL RELEASE Right 1980's   CATARACT EXTRACTION Bilateral    CATARACT EXTRACTION W/ INTRAOCULAR LENS  IMPLANT, BILATERAL  2000's   CORONARY ANGIOPLASTY WITH STENT PLACEMENT  01/07/2015   "2"   CORONARY STENT PLACEMENT     DILATION AND CURETTAGE OF UTERUS     ESOPHAGOGASTRODUODENOSCOPY (EGD) WITH ESOPHAGEAL DILATION  "several times"   IR GASTROSTOMY TUBE REMOVAL  12/23/2022   IR REPLC GASTRO/COLONIC TUBE PERCUT W/FLUORO  09/08/2022   JOINT REPLACEMENT     KNEE ARTHROSCOPY Left 1995   LAPAROSCOPIC CHOLECYSTECTOMY  2003   LEFT HEART CATHETERIZATION WITH CORONARY ANGIOGRAM N/A 12/27/2014   Procedure: LEFT HEART CATHETERIZATION WITH CORONARY ANGIOGRAM;  Surgeon: Laurey Morale, MD;  Location: [facility] CATH LAB;  Service: Cardiovascular;  Laterality: N/A;   PERCUTANEOUS CORONARY  ROTOBLATOR INTERVENTION (PCI-R)  01/07/2015   PERCUTANEOUS CORONARY ROTOBLATOR INTERVENTION (PCI-R) N/A 01/07/2015   Procedure: PERCUTANEOUS CORONARY ROTOBLATOR INTERVENTION (PCI-R);  Surgeon: Micheline Chapman, MD;  Location: [facility] CATH LAB;  Service: Cardiovascular;  Laterality: N/A;   POSTERIOR FUSION THORACIC SPINE  08/2015   POSTERIOR LUMBAR FUSION  01/2015   TOTAL KNEE ARTHROPLASTY Bilateral 1990's - 2000's    Social History   Socioeconomic History   Marital status: Widowed    Spouse name: Not on file   Number of children: 2   Years of education: HS   Highest education level: Not on file  Occupational History   Occupation: Comptroller- retired    Comment: retired   Occupation: retired  Tobacco Use   Smoking status: Never   Smokeless tobacco: Never  Vaping Use   Vaping Use: Never used  Substance and Sexual Activity  Alcohol use: No    Comment: 01/07/2015 "glass of wine at Christmas, maybe"   Drug use: No   Sexual activity: Not Currently  Other Topics Concern   Not on file  Social History Narrative   Patient is right handed   Long term resident of [facility]    Social Determinants of Health   Financial Resource Strain: Not on file  Food Insecurity: Not on file  Transportation Needs: Not on file  Physical Activity: Inactive (04/04/2020)   Exercise Vital Sign    Days of Exercise per Week: 0 days    Minutes of Exercise per Session: 0 min  Stress: Stress Concern Present (04/04/2020)   [employer] of Occupational Health - Occupational Stress Questionnaire    Feeling of Stress : To some extent  Social Connections: Socially Isolated (04/04/2020)   Social Connection and Isolation Panel [NHANES]    Frequency of Communication with Friends and Family: Three times a week    Frequency of Social Gatherings with Friends and Family: Once a week    Attends Religious Services: Never    Database administrator or Organizations: No    Attends Banker Meetings: Never    Marital  Status: Widowed  Catering manager Violence: Not on file   Family History  Problem Relation Age of Onset   Coronary artery disease Father    Peripheral vascular disease Father    Coronary artery disease Mother    Coronary artery disease Brother    Colon cancer Sister    Emphysema Sister    Sleep apnea Son    Colon cancer Sister        spread to her brain   Emphysema Brother    Dementia Neg Hx       VITAL SIGNS BP 127/71   Pulse 74   Temp (!) 97.1 F (36.2 C)   Resp 18   Ht 5\' 6"  (1.676 m)   Wt 180 lb 12.8 oz (82 kg)   SpO2 91%   BMI 29.18 kg/m   Outpatient Encounter Medications as of 04/20/2023  Medication Sig   acetaminophen (TYLENOL) 325 MG tablet Take 650 mg by mouth every 8 (eight) hours.   aspirin EC 81 MG tablet Take 81 mg by mouth daily. Swallow whole.   busPIRone (BUSPAR) 10 MG tablet Take 10 mg by mouth 3 (three) times daily.   clobetasol (TEMOVATE) 0.05 % external solution Apply 1 application. topically 2 (two) times a week. Monday and Friday   diclofenac Sodium (VOLTAREN) 1 % GEL Apply 2 g topically 3 (three) times daily. to neck and shoulders for pain management   diltiazem (CARDIZEM) 120 MG tablet Take 120 mg by mouth every 8 (eight) hours.   ELIQUIS 5 MG TABS tablet Take 1 tablet (5 mg total) by mouth 2 (two) times daily.   eszopiclone (LUNESTA) 2 MG TABS tablet Take 1 tablet (2 mg total) by mouth at bedtime.   fentaNYL (DURAGESIC) 25 MCG/HR Place 1 patch onto the skin every 3 (three) days.   FLUoxetine (PROZAC) 20 MG capsule Take 20 mg by mouth daily. Give with 40 mg to equal 60   FLUoxetine (PROZAC) 40 MG capsule Take 40 mg by mouth daily. Give with 20 mg to equal 60   furosemide (LASIX) 40 MG tablet Take 40 mg by mouth 2 (two) times daily. 9 am   gabapentin (NEURONTIN) 300 MG capsule Take 300 mg by mouth 3 (three) times daily.   lidocaine 4 % Place 1  patch onto the skin in the morning and at bedtime.   loratadine (CLARITIN) 10 MG tablet Take 10 mg by  mouth daily.   methocarbamol (ROBAXIN) 500 MG tablet Take 500 mg by mouth every 6 (six) hours as needed for muscle spasms.   miconazole (MICOTIN) 2 % powder Apply 1 Application topically as directed. Every Shift - PRN; PRN 1, PRN 2, PRN 3   naloxone (NARCAN) nasal spray 4 mg/0.1 mL Special Instructions: As needed for depressed respirations less than 8/minute. Notify provider if given. If no effectiveness or improvement, contact 911. As Needed   nitroGLYCERIN (NITROSTAT) 0.4 MG SL tablet Place 1 tablet (0.4 mg total) under the tongue every 5 (five) minutes as needed for chest pain (up to 3 doses).   Omeprazole-Sodium Bicarbonate (ZEGERID) 20-1100 MG CAPS capsule Take 1 capsule by mouth 2 (two) times daily. 10 am and 10 pm   ondansetron (ZOFRAN-ODT) 4 MG disintegrating tablet Take 4 mg by mouth every 6 (six) hours as needed for nausea or vomiting.   potassium chloride (MICRO-K) 10 MEQ CR capsule Take 20 mEq by mouth daily.   rOPINIRole (REQUIP) 0.5 MG tablet Take 0.5 mg by mouth in the morning and at bedtime.   traMADol (ULTRAM) 50 MG tablet Take 0.5 tablets (25 mg total) by mouth every 8 (eight) hours.   [DISCONTINUED] Cholecalciferol 25 MCG (1000 UT) tablet Take 1,000 Units by mouth daily. 10 am   [DISCONTINUED] rosuvastatin (CRESTOR) 10 MG tablet Take 10 mg by mouth daily. 9 am   No facility-administered encounter medications on file as of 04/20/2023.     SIGNIFICANT DIAGNOSTIC EXAMS  PREVIOUS   05-23-22: chest x-ray: mild congestive heart failure  07-14-22: chest x-ray:  Changes of bibasilar atelectasis  Nonspecific perihilar inflammatory changes Bilateral spinal fusion hardware   07-20-22: chest x-ray:  Nonspecific perihilar changes Emphysema No improvement compared to 07-14-22.   07-21-22: chest x-ray:  No acute pulmonary process.   09-21-22: swallow study:   Recommend regular textures vs mechanical soft per treating SLP at SNF and thin liquids via cup/straw, alternate solids and  liquids, implement effortful/hard swallow with solids, and Pt to sit upright for all eating and drinking and 30+ minutes after (avoid bending over as well). Pt will likely need to proceed with solid foods cautiously to see if she experiences and regurgitation. Recommend f/u with SLP services at SNF. Ok for PO medication whole with water.   NO NEW EXAMS   LABS REVIEWED: PREVIOUS    04-08-22: wbc 7.2 hgb 7.2; hct 11.9; mcv 35.2 plt 185; glucose 114; bun 11; creat 0.37; k+ 4.5; na++ 132; ca 8.6; GFR >90 05-21-22: glucose 113; bun 19; creat 0.5; k+ 4.2; na++ 135; ca 8.5 gfr>60; protein 5.2; albumin 2.4  05-23-22: wbc 5.4; hgb 9.2; hct 29.6 plt 184; glucose 123; bun 21; creat 0.54; k+ 3.8; na++ 8.2; ca 8.2 gfr >60 07-01-22: glucose 91; bun 16; creat 0.44; k+ 4.1; na++ 135; ca 8.6; gfr>60 07-14-22: wbc 9.9; hgb 10.8; hct 35.3; mcv 83.8 plt 283; glucose 168; bun 24; creat 0.52; k+ 4.2; na++ 133; ca 8.6 gfr >60 urine culture:  blood culture: klebsiella pneumoniae  07-15-22: wbc 7.1; hgb 9.4; hct 31.2; mcv 85.2 plt 246; glucose 134; bun 28; creat 0.52; k+ 4.6; na++ 135; ca 8.2; gfr >60; protein 6.2 albumin 2.5; ast 50 alt 85 alk phos 283  07-19-22: blood culture: no growth  07-20-22: wbc 6.2; hgb 9.9; hct 33.2; mcv 84.9 plt 270; glucose 121; bun  24; creat 0.56; k+ 3.6; na++ 135; ca 8.5 gfr >60; protein 6.3 albumin 2.4; ast 31; alt 65; alk phos 286 07-21-22: c-dff + 07-23-22: wbc 10.6; hgb 35.1; hct 35.1; mcv 84.2 plt 244; glucose 113; bun 27; creat 0.57; k+ 4.6; na++ 135; ca 9.0; gfr >60; ast 44 alt 58; alk phos 258; protein 6.6; albumin 2.6  TODAY  02-18-23: wbc 8.4; hgb 11.5; hct 36.6; mcv 88.8 plt 200; glucose 96; bun 11; creat 0.61; k+ 3.7; na++ 134; ca 8.5 gfr >60 alk phos 155; protein 6.6 albumin 2.9; chl 107; ldl 40 trig 58 hdl 55     Review of Systems  Constitutional:  Negative for malaise/fatigue.  Respiratory:  Negative for cough and shortness of breath.   Cardiovascular:  Negative for chest pain,  palpitations and leg swelling.  Gastrointestinal:  Negative for abdominal pain, constipation and heartburn.  Musculoskeletal:  Negative for back pain, joint pain and myalgias.  Skin: Negative.   Neurological:  Negative for dizziness.  Psychiatric/Behavioral:  The patient is not nervous/anxious.    Physical Exam Constitutional:      General: She is not in acute distress.    Appearance: She is well-developed. She is not diaphoretic.  Neck:     Thyroid: No thyromegaly.  Cardiovascular:     Rate and Rhythm: Normal rate and regular rhythm.     Pulses: Normal pulses.     Heart sounds: Normal heart sounds.  Pulmonary:     Effort: Pulmonary effort is normal. No respiratory distress.     Breath sounds: Normal breath sounds.  Abdominal:     General: Bowel sounds are normal. There is no distension.     Palpations: Abdomen is soft.     Tenderness: There is no abdominal tenderness.  Musculoskeletal:        General: Normal range of motion.     Cervical back: Neck supple.     Right lower leg: No edema.     Left lower leg: No edema.  Lymphadenopathy:     Cervical: No cervical adenopathy.  Skin:    General: Skin is warm and dry.  Neurological:     Mental Status: She is alert and oriented to person, place, and time.  Psychiatric:        Mood and Affect: Mood normal.      ASSESSMENT/ PLAN:  TODAY  Restless leg syndrome: will continue requip 0.5 mg twice daily   2. Esophageal stricture/gastroesophageal reflux disease without esophagitis/paraesophageal hernia: will continue zegrid 20/1100 mg twice daily   3. Chronic diastolic CHF (congestive heart failure) EF 40-45% will continue lasix 40 mg twice daily with k+ 20 meq daily   4. Permanent atrial fibrillation: heart rate is stable will continue diltiazem 120 mg every 8 hours for rate control and eliquis 5 mg twice daily   PREVIOUS   5. Coronary artery disease involving native artery of native heart with unstable angina: will continue  asa 81 mg daily has prn ntg  6. Major depression recurrent chronic: will continue prozac 60 mg daily and buspar 10 mg three times daily for anxiety   7. Protein calorie malnutrition: protein 6.6 albumin 2.9 will monitor will continue supplements as directed  8. Chronic pain associated with significant psychosocial dysfunction: will continue duragesic 25 mcg patch every 3 days; robaxin 500 mg every 6 hours as needed; voltaren gel 2 gm three times daily and lidoderm patch; is off vicodin; ultram 25 mg three times daily with tylenol 650 mg three times  daily ; gabapentin 300 mg three times daily   9. Iron deficiency anemia associated with chronic blood loss: hgb 11.5 will monitor   10. Aortic atherosclerosis (ct 3-28-2) is on asa statin  12. . Chronic non seasonal allergic rhinitis will continue claritin 10 mg daily   13. Lichen sclerosis et atrophicus of the vulva: will continue tenovate to area twice weekly   14. Hyperlipidemia ldl goal <100: ldl 40 crestor has been stopped    15. Irritable bowel syndrome with constipation and diarrhea: will monitor   16. Insomnia: is on lunesta 2 mg nightly    Synthia Innocent NP [facility]   call [phone]

## 2023-04-28 ENCOUNTER — Other Ambulatory Visit: Payer: Self-pay | Admitting: Adult Health

## 2023-04-28 MED ORDER — FENTANYL 25 MCG/HR TD PT72: 1.0000 | MEDICATED_PATCH | TRANSDERMAL | 0 refills | Status: DC

## 2023-04-28 MED ORDER — TRAMADOL HCL 50 MG PO TABS: 25.0000 mg | ORAL_TABLET | Freq: Three times a day (TID) | ORAL | 0 refills | Status: DC

## 2023-04-28 MED ORDER — ESZOPICLONE 2 MG PO TABS: 2.0000 mg | ORAL_TABLET | Freq: Every day | ORAL | 0 refills | Status: DC

## 2023-05-03 ENCOUNTER — Non-Acute Institutional Stay (SKILLED_NURSING_FACILITY): Payer: Medicare HMO | Admitting: Family Medicine

## 2023-05-03 DIAGNOSIS — G2581 Restless legs syndrome: Secondary | ICD-10-CM

## 2023-05-03 DIAGNOSIS — R600 Localized edema: Secondary | ICD-10-CM

## 2023-05-03 DIAGNOSIS — Z79891 Long term (current) use of opiate analgesic: Secondary | ICD-10-CM

## 2023-05-03 NOTE — Progress Notes (Signed)
Location:  [facility]   Place of Service:    [facility] Room 159-P   CODE STATUS: DNR  Allergies  Allergen Reactions   Buprenorphine Hcl Itching   Cymbalta [Duloxetine Hcl] Other (See Comments)    Other reaction(s): Other (See Comments) Personality changes - crying  2014   Morphine And Related Itching   Oxycodone-Acetaminophen Other (See Comments)    Personality changes    Valium [Diazepam] Other (See Comments)    Personality changes - "in another world" ;  Hallucinations  2015   Statins Other (See Comments)    Other reaction(s): Other (See Comments) Muscle weakness Muscle weakness   Morphine Itching   Altace [Ramipril] Other (See Comments)    unknown   Floxin [Ofloxacin] Other (See Comments)    unknown   Hydromorphone Other (See Comments)    Other reaction(s): Confusion (intolerance) unknown   Lovaza [Omega-3-Acid Ethyl Esters] Other (See Comments)    Muscle weakness    Trilipix [Choline Fenofibrate] Other (See Comments)    Muscle weakness    Zetia [Ezetimibe] Other (See Comments)    Muscle weakness     No chief complaint on file.   HPI:  Patient is a long term resident that is being managed for her chronic illnesses some of which include Restless leg syndrome, Esophageal stricture/GERD, Chronic diastolic CHF, permanent atrial fibrillation, chronic pain.  This morning she is tearful when reporting worsening of her lower leg swelling, redness and pain. States laying in bed helps. Tries to use her legs to stay mobile and move her around in her wheelchair. States ambulating is difficulty due to her back and hip pain. Denies having compression stockings. Denies fevers.   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 difficile colitis 07/21/2022   CAD (coronary artery disease)    a. Abnl nuc 11/2014. Cath 12/2014 - turned down for CABG. Ultimately s/p TTVP, rotational atherectomy, PTCA and stenting of the ostial LCx and left main into the LAD (crush technique), and IVUS of the LAD/Left main. // b. Myoview 11/17: EF 48, poor quality/significant artifact; inf-lateral, inferior ischemia; Intermediate Risk   Chronic atrial fibrillation (HCC)    a.  First noted post-op 9/15 spinal fusion.  She had cardioversion, not on anticoagulation. Fall risk, unsteady. // failed DCCV // Holter 10/17: AFib, Avg HR 97, PVCs, no other arrhythmia   Chronic back pain greater than 3 months duration    a. spinal stenosis.  Spinal fusion with rods in 2/15 at [facility] spinal fusion 9/15.   Chronic diastolic CHF    Echo 2/17:  EF 50-55%, trivial AI, midl MR, mod LAE, PASP 37 mmHg // Echocardiogram 03/2020: EF 45-50, no RWMA, mild LVH, mild reduced RVSF, mildly elevated PASP (RVSP 38.3), mild LAE, mild MR, mild to mod TR, trivial AI // Echo 9/21: EF 45, mild LVH, mildly reduced RV SF, moderate LAE, mild RAE, mild MR, mild AI      Circadian rhythm sleep disorder    CTS (carpal tunnel syndrome)    Deficiency anemia 11/10/2014   Depression    Diarrhea    Diverticulitis of colon    Esophageal stricture    Gastritis    Gastroesophageal hernia  07/06/2013   GERD (gastroesophageal reflux disease)    Hiatal hernia    History of blood transfusion    "most of them related to OR's"    HTN (hypertension)    Hyperlipidemia    IBS (irritable bowel syndrome)    Incontinence 10/13/2012   Insomnia    Ischemic chest pain (HCC)    Memory disorder 12/04/2014   Metabolic syndrome 04/24/2014   MGUS (monoclonal gammopathy of unknown significance) dx'd 11/2014   a. Neg BMB 11/2014.   Multiple falls    Obesity    Obstructive sleep apnea    "have mask; don't wear it" (01/07/2015)   Orthostasis    PAT  (paroxysmal atrial tachycardia)    Personal history of colonic polyps 10/25/2011 & 12/02/11   not retrieved Dr Victorino Dike & tubular adenomas   Pneumonia 03/2014   Sinus bradycardia    a. Baseline HR 50s-60s.   Stroke [facility]) early 2000's   "small"; denies residual on 01/07/2015)    Past Surgical History:  Procedure Laterality Date   BACK SURGERY     BONE MARROW BIOPSY  11/2014   CARDIAC CATHETERIZATION  12/27/2014   Procedure: INTRAVASCULAR PRESSURE WIRE/FFR STUDY;  Surgeon: Laurey Morale, MD;  Location: [facility] CATH LAB;  Service: Cardiovascular;;   CARDIAC CATHETERIZATION N/A 11/25/2016   Procedure: Left Heart Cath and Coronary Angiography;  Surgeon: Tonny Bollman, MD;  Location: [facility] INVASIVE CV LAB;  Service: Cardiovascular;  Laterality: N/A;   CARDIOVERSION N/A 02/13/2016   Procedure: CARDIOVERSION;  Surgeon: Wendall Stade, MD;  Location: [facility] ENDOSCOPY;  Service: Cardiovascular;  Laterality: N/A;   CARPAL TUNNEL RELEASE Right 1980's   CATARACT EXTRACTION Bilateral    CATARACT EXTRACTION W/ INTRAOCULAR LENS  IMPLANT, BILATERAL  2000's   CORONARY ANGIOPLASTY WITH STENT PLACEMENT  01/07/2015   "2"   CORONARY STENT PLACEMENT     DILATION AND CURETTAGE OF UTERUS     ESOPHAGOGASTRODUODENOSCOPY (EGD) WITH ESOPHAGEAL DILATION  "several times"   IR GASTROSTOMY TUBE REMOVAL  12/23/2022   IR REPLC GASTRO/COLONIC TUBE PERCUT W/FLUORO  09/08/2022   JOINT REPLACEMENT     KNEE ARTHROSCOPY Left 1995   LAPAROSCOPIC CHOLECYSTECTOMY  2003   LEFT HEART CATHETERIZATION WITH CORONARY ANGIOGRAM N/A 12/27/2014   Procedure: LEFT HEART CATHETERIZATION WITH CORONARY ANGIOGRAM;  Surgeon: Laurey Morale, MD;  Location: [facility] CATH LAB;  Service: Cardiovascular;  Laterality: N/A;   PERCUTANEOUS CORONARY ROTOBLATOR INTERVENTION (PCI-R)  01/07/2015   PERCUTANEOUS CORONARY ROTOBLATOR INTERVENTION (PCI-R) N/A 01/07/2015   Procedure: PERCUTANEOUS CORONARY ROTOBLATOR INTERVENTION (PCI-R);  Surgeon: Micheline Chapman, MD;  Location:  [facility] CATH LAB;  Service: Cardiovascular;  Laterality: N/A;   POSTERIOR FUSION THORACIC SPINE  08/2015   POSTERIOR LUMBAR FUSION  01/2015   TOTAL KNEE ARTHROPLASTY Bilateral 1990's - 2000's    Social History   Socioeconomic History   Marital status: Widowed    Spouse name: Not on file   Number of children: 2   Years of education: HS   Highest education level: Not on file  Occupational History   Occupation: Comptroller- retired    Comment: retired   Occupation: retired  Tobacco Use   Smoking status: Never   Smokeless tobacco: Never  Vaping Use   Vaping Use: Never used  Substance and Sexual Activity   Alcohol use: No    Comment: 01/07/2015 "glass of wine at Christmas, maybe"   Drug use: No   Sexual activity: Not Currently  Other Topics Concern   Not on  file  Social History Narrative   Patient is right handed   Long term resident of [facility]    Social Determinants of Health   Financial Resource Strain: Not on file  Food Insecurity: Not on file  Transportation Needs: Not on file  Physical Activity: Inactive (04/04/2020)   Exercise Vital Sign    Days of Exercise per Week: 0 days    Minutes of Exercise per Session: 0 min  Stress: Stress Concern Present (04/04/2020)   [employer] of Occupational Health - Occupational Stress Questionnaire    Feeling of Stress : To some extent  Social Connections: Socially Isolated (04/04/2020)   Social Connection and Isolation Panel [NHANES]    Frequency of Communication with Friends and Family: Three times a week    Frequency of Social Gatherings with Friends and Family: Once a week    Attends Religious Services: Never    Database administrator or Organizations: No    Attends Banker Meetings: Never    Marital Status: Widowed  Catering manager Violence: Not on file   Family History  Problem Relation Age of Onset   Coronary artery disease Father    Peripheral vascular disease Father    Coronary artery disease Mother    Coronary  artery disease Brother    Colon cancer Sister    Emphysema Sister    Sleep apnea Son    Colon cancer Sister        spread to her brain   Emphysema Brother    Dementia Neg Hx       VITAL SIGNS There were no vitals taken for this visit.  Outpatient Encounter Medications as of 05/03/2023  Medication Sig   acetaminophen (TYLENOL) 325 MG tablet Take 650 mg by mouth every 8 (eight) hours.   aspirin EC 81 MG tablet Take 81 mg by mouth daily. Swallow whole.   busPIRone (BUSPAR) 10 MG tablet Take 10 mg by mouth 3 (three) times daily.   clobetasol (TEMOVATE) 0.05 % external solution Apply 1 application. topically 2 (two) times a week. Monday and Friday   diclofenac Sodium (VOLTAREN) 1 % GEL Apply 2 g topically 3 (three) times daily. to neck and shoulders for pain management   diltiazem (CARDIZEM) 120 MG tablet Take 120 mg by mouth every 8 (eight) hours.   ELIQUIS 5 MG TABS tablet Take 1 tablet (5 mg total) by mouth 2 (two) times daily.   eszopiclone (LUNESTA) 2 MG TABS tablet Take 1 tablet (2 mg total) by mouth at bedtime.   fentaNYL (DURAGESIC) 25 MCG/HR Place 1 patch onto the skin every 3 (three) days.   FLUoxetine (PROZAC) 20 MG capsule Take 20 mg by mouth daily. Give with 40 mg to equal 60   FLUoxetine (PROZAC) 40 MG capsule Take 40 mg by mouth daily. Give with 20 mg to equal 60   furosemide (LASIX) 40 MG tablet Take 40 mg by mouth 2 (two) times daily. 9 am   gabapentin (NEURONTIN) 300 MG capsule Take 300 mg by mouth 3 (three) times daily.   lidocaine 4 % Place 1 patch onto the skin in the morning and at bedtime.   loratadine (CLARITIN) 10 MG tablet Take 10 mg by mouth daily.   methocarbamol (ROBAXIN) 500 MG tablet Take 500 mg by mouth every 6 (six) hours as needed for muscle spasms.   miconazole (MICOTIN) 2 % powder Apply 1 Application topically as directed. Every Shift - PRN; PRN 1, PRN 2, PRN 3  naloxone (NARCAN) nasal spray 4 mg/0.1 mL Special Instructions: As needed for depressed  respirations less than 8/minute. Notify provider if given. If no effectiveness or improvement, contact 911. As Needed   nitroGLYCERIN (NITROSTAT) 0.4 MG SL tablet Place 1 tablet (0.4 mg total) under the tongue every 5 (five) minutes as needed for chest pain (up to 3 doses).   Omeprazole-Sodium Bicarbonate (ZEGERID) 20-1100 MG CAPS capsule Take 1 capsule by mouth 2 (two) times daily. 10 am and 10 pm   ondansetron (ZOFRAN-ODT) 4 MG disintegrating tablet Take 4 mg by mouth every 6 (six) hours as needed for nausea or vomiting.   potassium chloride (MICRO-K) 10 MEQ CR capsule Take 20 mEq by mouth daily.   rOPINIRole (REQUIP) 0.5 MG tablet Take 0.5 mg by mouth in the morning and at bedtime.   traMADol (ULTRAM) 50 MG tablet Take 0.5 tablets (25 mg total) by mouth every 8 (eight) hours.   No facility-administered encounter medications on file as of 05/03/2023.     SIGNIFICANT DIAGNOSTIC EXAMS  General: alert, tearful, NAD CV: RRR no murmurs Resp: CTAB normal WOB GI: soft, non distended Extremities: 1-2+ pitting edema to knee bilaterally. LE erythematous and TTP   ASSESSMENT/ PLAN:  Lower extremity swelling: secondary to venous insufficiency. Recommend compression stockings and elevating legs.   Restless leg syndrome: will continue requip 0.5 mg twice daily- no features of dopamine excess    3.   Chronic pain associated with significant psychosocial dysfunction: will continue duragesic 25 mcg patch every 3 days; robaxin 500 mg every 6 hours as needed; voltaren gel 2 gm three times daily and lidoderm 1 patch daily prn; ultram 25 mg three times daily with tylenol 650 mg three times daily; gabapentin 300 mg three times daily    4. Permanent atrial fibrillation: heart rate is stable. Will continue diltiazem 120 mg every 8 hours for rate control and eliquis 5 mg twice daily     PREVIOUS    5. Coronary artery disease involving native artery of native heart with unstable angina: will continue asa  81 mg daily has prn ntg  Chronic diastolic CHF (congestive heart failure) Echo in 2022 EF 50-55% with indeterminate diastolic function. will continue lasix 40 mg twice daily with k+ 20 meq daily    6. Major depression recurrent chronic: will continue prozac 60 mg daily and buspar 10 mg three times daily for anxiety    7. Protein calorie malnutrition: protein 6.6 albumin 2.9 will monitor will continue supplements as directed   8. Esophageal stricture/gastroesophageal reflux disease without esophagitis/paraesophageal hernia: will continue zegrid 20/1100 mg twice daily    9. Iron deficiency anemia associated with chronic blood loss: hgb 11.5 three months ago. will monitor    10. Aortic atherosclerosis (ct 3-28-2) is on asa statin   12. . Chronic non seasonal allergic rhinitis will continue claritin 10 mg daily    13. Lichen sclerosis et atrophicus of the vulva: will continue tenovate to area twice weekly    14. Hyperlipidemia ldl goal <100: ldl 40 crestor has been stopped     15. Irritable bowel syndrome with constipation and diarrhea: will monitor    16. Insomnia: is on lunesta 2 mg nightly

## 2023-05-04 NOTE — Progress Notes (Signed)
I discussed Ligman with Dr Idalia Needle and saw the patient with Dr Idalia Needle.

## 2023-05-05 ENCOUNTER — Non-Acute Institutional Stay (SKILLED_NURSING_FACILITY): Payer: Medicare HMO | Admitting: Internal Medicine

## 2023-05-05 ENCOUNTER — Encounter: Payer: Self-pay | Admitting: Internal Medicine

## 2023-05-05 DIAGNOSIS — R600 Localized edema: Secondary | ICD-10-CM | POA: Diagnosis not present

## 2023-05-05 DIAGNOSIS — E43 Unspecified severe protein-calorie malnutrition: Secondary | ICD-10-CM | POA: Diagnosis not present

## 2023-05-05 DIAGNOSIS — I4821 Permanent atrial fibrillation: Secondary | ICD-10-CM | POA: Diagnosis not present

## 2023-05-05 NOTE — Assessment & Plan Note (Signed)
On 02/18/2023 albumin was low at 2.9 but total protein was low normal at 6.6.  I recommended protein supplementation to her.  Nutritionist continues to follow at the SNF.

## 2023-05-05 NOTE — Progress Notes (Unsigned)
NURSING HOME LOCATION:  [facility] ROOM NUMBER:  [id number] P  CODE STATUS:  DNR  PCP:  Synthia Innocent NP  This is a nursing facility follow up visit for specific acute issue of intractable edema.  Interim medical record and care since last SNF visit was updated with review of diagnostic studies and change in clinical status since last visit were documented.  HPI: [facility] DON requested evaluation of intractable edema of the lower extremities.  She described her findings as "hard edema" with "toes swollen like sausages."  Staff reports that the patient has not been compliant with leg elevation as recommended and has declined wrapping. Staff reports that the patient's daughter had encouraged her to try the TED hose again; but the patient has deferred having these placed for the last 3 days. Mrs. Dash, salt substitute has been offered to the resident.  Most recent labs 02/18/2023 revealed an albumin of 2.9 and total protein of 6.6.  GFR was greater than 60.  Serially anemia has improved with H/H of 11.5/36.6.  There is no current BNP or TSH; these were last performed in 2022.  Review of systems: She validates pain in her feet and ankles related to the swelling.  She expresses concerns about having blood clots.  She did not realize the Eliquis 5 mg twice a day is for clot prophylaxis.  She states that she is not wearing the TED hose "because no one has put them on."  The Nurse Tech told me she has made 3 attempts today to do so but was told "not now." Mobilization had been limited by back pain.  She states that Dr. Horald Chestnut injected her spine yesterday.  She is unsure what medication was instilled and thought it was a "epidural."  Apparently it was a nerve block because of lumbar radicular pain. She describes poor appetite because of loss of taste which she relates to a cough syrup she took for a extended period here at the SNF. She denies any bleeding dyscrasias with the Eliquis.  She  denies melena.  She does complain of "sticky stool" which is "hard to clean." She complains of urinary frequency with the twice daily furosemide dosage.  Physical exam:  Pertinent or positive findings: She is missing multiple teeth.  Clinically she is in A-fib with a slow ventricular response.  She has low-grade musical rhonchi in a scattered distribution.  Abdomen is protuberant.  Dorsalis pedis pulses are stronger than posterior tibial pulses.  She has tense nonpitting edema of the lower extremities.  There is faint erythema over the lower shins greater on the right than the left.  These blanch to pressure.  She has fusiform changes of the knees with well-healed op scars.  General appearance: Adequately nourished; no acute distress, increased work of breathing is present.   Lymphatic: No lymphadenopathy about the head, neck, axilla. Eyes: No conjunctival inflammation or lid edema is present. There is no scleral icterus. Ears:  External ear exam shows no significant lesions or deformities.   Nose:  External nasal examination shows no deformity or inflammation. Nasal mucosa are pink and moist without lesions, exudates Neck:  No thyromegaly, masses, tenderness noted.    Heart:  No gallop, murmur, click, rub .  Lungs: without wheezes, rales, rubs. Abdomen: Bowel sounds are normal. Abdomen is soft and nontender with no organomegaly, hernias, masses. GU: Deferred  Extremities:  No cyanosis, clubbing  Neurologic exam :Balance, Rhomberg, finger to nose testing could not be completed  due to clinical state Skin: Warm & dry w/o tenting.  See summary under each active problem in the Problem List with associated updated therapeutic plan

## 2023-05-05 NOTE — Assessment & Plan Note (Signed)
Staff reports she is noncompliant with TED hose and leg elevation.  I explained the pathophysiology of venous insufficiency and the necessity for external compression to control the edema.  Protein supplementation because of low albumin was recommended.  Sodium restriction was also encouraged to prevent fluid retention.

## 2023-05-05 NOTE — Patient Instructions (Signed)
See assessment and plan under each diagnosis in the problem list and acutely for this visit 

## 2023-05-05 NOTE — Assessment & Plan Note (Signed)
She remains in atrial fibs clinically with a well-controlled ventricular rate.  No change indicated.  Continue Eliquis.

## 2023-05-19 ENCOUNTER — Encounter: Payer: Self-pay | Admitting: Adult Health

## 2023-05-19 ENCOUNTER — Non-Acute Institutional Stay (SKILLED_NURSING_FACILITY): Payer: Medicare HMO | Admitting: Adult Health

## 2023-05-19 DIAGNOSIS — I25110 Atherosclerotic heart disease of native coronary artery with unstable angina pectoris: Secondary | ICD-10-CM

## 2023-05-19 DIAGNOSIS — G894 Chronic pain syndrome: Secondary | ICD-10-CM | POA: Diagnosis not present

## 2023-05-19 DIAGNOSIS — E43 Unspecified severe protein-calorie malnutrition: Secondary | ICD-10-CM

## 2023-05-19 DIAGNOSIS — F339 Major depressive disorder, recurrent, unspecified: Secondary | ICD-10-CM

## 2023-05-19 NOTE — Progress Notes (Unsigned)
Location:  [facility] Room Number: 159P Place of Service:  SNF (31)   CODE STATUS: DNR  Allergies  Allergen Reactions   Buprenorphine Hcl Itching   Cymbalta [Duloxetine Hcl] Other (See Comments)    Other reaction(s): Other (See Comments) Personality changes - crying  2014   Morphine And Codeine Itching   Oxycodone-Acetaminophen Other (See Comments)    Personality changes    Valium [Diazepam] Other (See Comments)    Personality changes - "in another world" ;  Hallucinations  2015   Statins Other (See Comments)    Other reaction(s): Other (See Comments) Muscle weakness Muscle weakness   Morphine Itching   Altace [Ramipril] Other (See Comments)    unknown   Floxin [Ofloxacin] Other (See Comments)    unknown   Hydromorphone Other (See Comments)    Other reaction(s): Confusion (intolerance) unknown   Lovaza [Omega-3-Acid Ethyl Esters] Other (See Comments)    Muscle weakness    Trilipix [Choline Fenofibrate] Other (See Comments)    Muscle weakness    Zetia [Ezetimibe] Other (See Comments)    Muscle weakness     Chief Complaint  Patient presents with   Medical Management of Chronic Issues    Routine follow up   Immunizations    Dose 3 Td or Tdap vaccine due    HPI:    Past Medical History:  Diagnosis Date   Abnormal nuclear cardiac imaging test    Anemia, iron deficiency 07/02/2015   Anxiety    Arthritis    "knees, back, fingers, toes; joints" (01/07/2015)   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 difficile colitis 07/21/2022   CAD (coronary artery disease)    a. Abnl nuc 11/2014. Cath 12/2014 - turned down for CABG. Ultimately s/p TTVP, rotational atherectomy, PTCA and stenting of the ostial LCx and left main into the LAD (crush technique), and IVUS of the LAD/Left main. // b. Myoview 11/17: EF 48, poor quality/significant artifact; inf-lateral, inferior ischemia;  Intermediate Risk   Chronic atrial fibrillation (HCC)    a.  First noted post-op 9/15 spinal fusion.  She had cardioversion, not on anticoagulation. Fall risk, unsteady. // failed DCCV // Holter 10/17: AFib, Avg HR 97, PVCs, no other arrhythmia   Chronic back pain greater than 3 months duration    a. spinal stenosis.  Spinal fusion with rods in 2/15 at [facility] spinal fusion 9/15.   Chronic diastolic CHF    Echo 2/17:  EF 50-55%, trivial AI, midl MR, mod LAE, PASP 37 mmHg // Echocardiogram 03/2020: EF 45-50, no RWMA, mild LVH, mild reduced RVSF, mildly elevated PASP (RVSP 38.3), mild LAE, mild MR, mild to mod TR, trivial AI // Echo 9/21: EF 45, mild LVH, mildly reduced RV SF, moderate LAE, mild RAE, mild MR, mild AI      Circadian rhythm sleep disorder    CTS (carpal tunnel syndrome)    Deficiency anemia 11/10/2014   Depression    Diarrhea    Diverticulitis of colon    Esophageal stricture    Gastritis    Gastroesophageal hernia 07/06/2013   GERD (gastroesophageal reflux disease)    Hiatal hernia    History of blood transfusion    "most of them related to OR's"    HTN (hypertension)    Hyperlipidemia    IBS (irritable bowel syndrome)    Incontinence 10/13/2012   Insomnia    Ischemic chest pain (HCC)  Memory disorder 12/04/2014   Metabolic syndrome 04/24/2014   MGUS (monoclonal gammopathy of unknown significance) dx'd 11/2014   a. Neg BMB 11/2014.   Multiple falls    Obesity    Obstructive sleep apnea    "have mask; don't wear it" (01/07/2015)   Orthostasis    PAT (paroxysmal atrial tachycardia)    Personal history of colonic polyps 10/25/2011 & 12/02/11   not retrieved Dr Victorino Dike & tubular adenomas   Pneumonia 03/2014   Sinus bradycardia    a. Baseline HR 50s-60s.   Stroke [facility]) early 2000's   "small"; denies residual on 01/07/2015)    Past Surgical History:  Procedure Laterality Date   BACK SURGERY     BONE MARROW BIOPSY  11/2014   CARDIAC CATHETERIZATION   12/27/2014   Procedure: INTRAVASCULAR PRESSURE WIRE/FFR STUDY;  Surgeon: Laurey Morale, MD;  Location: [facility] CATH LAB;  Service: Cardiovascular;;   CARDIAC CATHETERIZATION N/A 11/25/2016   Procedure: Left Heart Cath and Coronary Angiography;  Surgeon: Tonny Bollman, MD;  Location: [facility] INVASIVE CV LAB;  Service: Cardiovascular;  Laterality: N/A;   CARDIOVERSION N/A 02/13/2016   Procedure: CARDIOVERSION;  Surgeon: Wendall Stade, MD;  Location: [facility] ENDOSCOPY;  Service: Cardiovascular;  Laterality: N/A;   CARPAL TUNNEL RELEASE Right 1980's   CATARACT EXTRACTION Bilateral    CATARACT EXTRACTION W/ INTRAOCULAR LENS  IMPLANT, BILATERAL  2000's   CORONARY ANGIOPLASTY WITH STENT PLACEMENT  01/07/2015   "2"   CORONARY STENT PLACEMENT     DILATION AND CURETTAGE OF UTERUS     ESOPHAGOGASTRODUODENOSCOPY (EGD) WITH ESOPHAGEAL DILATION  "several times"   IR GASTROSTOMY TUBE REMOVAL  12/23/2022   IR REPLC GASTRO/COLONIC TUBE PERCUT W/FLUORO  09/08/2022   JOINT REPLACEMENT     KNEE ARTHROSCOPY Left 1995   LAPAROSCOPIC CHOLECYSTECTOMY  2003   LEFT HEART CATHETERIZATION WITH CORONARY ANGIOGRAM N/A 12/27/2014   Procedure: LEFT HEART CATHETERIZATION WITH CORONARY ANGIOGRAM;  Surgeon: Laurey Morale, MD;  Location: [facility] CATH LAB;  Service: Cardiovascular;  Laterality: N/A;   PERCUTANEOUS CORONARY ROTOBLATOR INTERVENTION (PCI-R)  01/07/2015   PERCUTANEOUS CORONARY ROTOBLATOR INTERVENTION (PCI-R) N/A 01/07/2015   Procedure: PERCUTANEOUS CORONARY ROTOBLATOR INTERVENTION (PCI-R);  Surgeon: Micheline Chapman, MD;  Location: [facility] CATH LAB;  Service: Cardiovascular;  Laterality: N/A;   POSTERIOR FUSION THORACIC SPINE  08/2015   POSTERIOR LUMBAR FUSION  01/2015   TOTAL KNEE ARTHROPLASTY Bilateral 1990's - 2000's    Social History   Socioeconomic History   Marital status: Widowed    Spouse name: Not on file   Number of children: 2   Years of education: HS   Highest education level: Not on file  Occupational History    Occupation: Comptroller- retired    Comment: retired   Occupation: retired  Tobacco Use   Smoking status: Never   Smokeless tobacco: Never  Vaping Use   Vaping Use: Never used  Substance and Sexual Activity   Alcohol use: No    Comment: 01/07/2015 "glass of wine at Christmas, maybe"   Drug use: No   Sexual activity: Not Currently  Other Topics Concern   Not on file  Social History Narrative   Patient is right handed   Long term resident of [facility]    Social Determinants of Health   Financial Resource Strain: Not on file  Food Insecurity: Not on file  Transportation Needs: Not on file  Physical Activity: Inactive (04/04/2020)   Exercise Vital Sign    Days of Exercise per  Week: 0 days    Minutes of Exercise per Session: 0 min  Stress: Stress Concern Present (04/04/2020)   [employer] of Occupational Health - Occupational Stress Questionnaire    Feeling of Stress : To some extent  Social Connections: Socially Isolated (04/04/2020)   Social Connection and Isolation Panel [NHANES]    Frequency of Communication with Friends and Family: Three times a week    Frequency of Social Gatherings with Friends and Family: Once a week    Attends Religious Services: Never    Database administrator or Organizations: No    Attends Banker Meetings: Never    Marital Status: Widowed  Catering manager Violence: Not on file   Family History  Problem Relation Age of Onset   Coronary artery disease Father    Peripheral vascular disease Father    Coronary artery disease Mother    Coronary artery disease Brother    Colon cancer Sister    Emphysema Sister    Sleep apnea Son    Colon cancer Sister        spread to her brain   Emphysema Brother    Dementia Neg Hx       VITAL SIGNS BP (!) 130/59   Pulse 61   Temp (!) 97.2 F (36.2 C)   Resp 20   Ht 5\' 6"  (1.676 m)   Wt 177 lb 9.6 oz (80.6 kg)   SpO2 97%   BMI 28.67 kg/m   Outpatient Encounter Medications as of 05/19/2023   Medication Sig   acetaminophen (TYLENOL) 325 MG tablet Take 650 mg by mouth every 8 (eight) hours.   aspirin EC 81 MG tablet Take 81 mg by mouth daily. Swallow whole.   busPIRone (BUSPAR) 10 MG tablet Take 10 mg by mouth 3 (three) times daily.   clobetasol (TEMOVATE) 0.05 % external solution Apply 1 application. topically 2 (two) times a week. Monday and Friday   diclofenac Sodium (VOLTAREN) 1 % GEL Apply 2 g topically 3 (three) times daily. to neck and shoulders for pain management   diltiazem (CARDIZEM) 120 MG tablet Take 120 mg by mouth every 8 (eight) hours.   ELIQUIS 5 MG TABS tablet Take 1 tablet (5 mg total) by mouth 2 (two) times daily.   eszopiclone (LUNESTA) 2 MG TABS tablet Take 1 tablet (2 mg total) by mouth at bedtime.   fentaNYL (DURAGESIC) 25 MCG/HR Place 1 patch onto the skin every 3 (three) days.   FLUoxetine (PROZAC) 20 MG capsule Take 20 mg by mouth daily. Give with 40 mg to equal 60   FLUoxetine (PROZAC) 40 MG capsule Take 40 mg by mouth daily. Give with 20 mg to equal 60   furosemide (LASIX) 40 MG tablet Take 40 mg by mouth 2 (two) times daily. 9 am   gabapentin (NEURONTIN) 300 MG capsule Take 300 mg by mouth 3 (three) times daily.   lidocaine 4 % Place 1 patch onto the skin in the morning and at bedtime.   loratadine (CLARITIN) 10 MG tablet Take 10 mg by mouth daily.   methocarbamol (ROBAXIN) 500 MG tablet Take 500 mg by mouth every 6 (six) hours as needed for muscle spasms.   miconazole (MICOTIN) 2 % powder Apply 1 Application topically as directed. Every Shift - PRN; PRN 1, PRN 2, PRN 3   naloxone (NARCAN) nasal spray 4 mg/0.1 mL Special Instructions: As needed for depressed respirations less than 8/minute. Notify provider if given. If no effectiveness or  improvement, contact 911. As Needed   nitroGLYCERIN (NITROSTAT) 0.4 MG SL tablet Place 1 tablet (0.4 mg total) under the tongue every 5 (five) minutes as needed for chest pain (up to 3 doses).   Omeprazole-Sodium  Bicarbonate (ZEGERID) 20-1100 MG CAPS capsule Take 1 capsule by mouth 2 (two) times daily. 10 am and 10 pm   ondansetron (ZOFRAN-ODT) 4 MG disintegrating tablet Take 4 mg by mouth every 6 (six) hours as needed for nausea or vomiting.   potassium chloride (MICRO-K) 10 MEQ CR capsule Take 20 mEq by mouth daily.   rOPINIRole (REQUIP) 0.5 MG tablet Take 0.5 mg by mouth in the morning and at bedtime.   traMADol (ULTRAM) 50 MG tablet Take 0.5 tablets (25 mg total) by mouth every 8 (eight) hours.   No facility-administered encounter medications on file as of 05/19/2023.     SIGNIFICANT DIAGNOSTIC EXAMS       ASSESSMENT/ PLAN:     Synthia Innocent NP [facility]  Contact [phone] Monday through Friday 8am- 5pm  After hours call [phone]

## 2023-05-24 ENCOUNTER — Other Ambulatory Visit: Payer: Self-pay | Admitting: Adult Health

## 2023-05-24 MED ORDER — FENTANYL 25 MCG/HR TD PT72: 1.0000 | MEDICATED_PATCH | TRANSDERMAL | 0 refills | Status: DC

## 2023-05-24 MED ORDER — ESZOPICLONE 2 MG PO TABS: 2.0000 mg | ORAL_TABLET | Freq: Every day | ORAL | 0 refills | Status: DC

## 2023-05-24 MED ORDER — TRAMADOL HCL 50 MG PO TABS: 25.0000 mg | ORAL_TABLET | Freq: Three times a day (TID) | ORAL | 0 refills | Status: DC

## 2023-05-27 ENCOUNTER — Encounter: Payer: Self-pay | Admitting: Obstetrics & Gynecology

## 2023-05-27 ENCOUNTER — Ambulatory Visit (INDEPENDENT_AMBULATORY_CARE_PROVIDER_SITE_OTHER): Payer: Medicare HMO | Admitting: Obstetrics & Gynecology

## 2023-05-27 VITALS — BP 124/86 | HR 84 | Ht 66.0 in | Wt 187.0 lb

## 2023-05-27 DIAGNOSIS — N904 Leukoplakia of vulva: Secondary | ICD-10-CM | POA: Diagnosis not present

## 2023-05-27 NOTE — Progress Notes (Signed)
GYN VISIT Patient name: [name] [name] MRN [id number]  Date of birth: 08-Oct-1941 Chief Complaint:   Gynecologic Exam (Lichen sclerosus)  History of Present Illness:   [name] is a 82 y.o. G8P2002 PM female being seen today for consultation due to LS.     Pt previously seen by Dr. Ambrose Mantle (recently retired).  She is currently using Temovate about 2 times per week.  She denies vaginal discharge itching or irritation.  Denies pelvic pain.  Reports no acute GYN concerns.  No LMP recorded. Patient is postmenopausal.    Review of Systems:   Pertinent items are noted in HPI  Pertinent History Reviewed:   Past Surgical History:  Procedure Laterality Date   BACK SURGERY     BONE MARROW BIOPSY  11/2014   CARDIAC CATHETERIZATION  12/27/2014   Procedure: INTRAVASCULAR PRESSURE WIRE/FFR STUDY;  Surgeon: Laurey Morale, MD;  Location: [facility] CATH LAB;  Service: Cardiovascular;;   CARDIAC CATHETERIZATION N/A 11/25/2016   Procedure: Left Heart Cath and Coronary Angiography;  Surgeon: Tonny Bollman, MD;  Location: [facility] INVASIVE CV LAB;  Service: Cardiovascular;  Laterality: N/A;   CARDIOVERSION N/A 02/13/2016   Procedure: CARDIOVERSION;  Surgeon: Wendall Stade, MD;  Location: [facility] ENDOSCOPY;  Service: Cardiovascular;  Laterality: N/A;   CARPAL TUNNEL RELEASE Right 1980's   CATARACT EXTRACTION Bilateral    CATARACT EXTRACTION W/ INTRAOCULAR LENS  IMPLANT, BILATERAL  2000's   CORONARY ANGIOPLASTY WITH STENT PLACEMENT  01/07/2015   "2"   CORONARY STENT PLACEMENT     DILATION AND CURETTAGE OF UTERUS     ESOPHAGOGASTRODUODENOSCOPY (EGD) WITH ESOPHAGEAL DILATION  "several times"   IR GASTROSTOMY TUBE REMOVAL  12/23/2022   IR REPLC GASTRO/COLONIC TUBE PERCUT W/FLUORO  09/08/2022   JOINT REPLACEMENT     KNEE ARTHROSCOPY Left 1995   LAPAROSCOPIC CHOLECYSTECTOMY  2003   LEFT HEART CATHETERIZATION WITH CORONARY ANGIOGRAM N/A 12/27/2014   Procedure: LEFT HEART CATHETERIZATION WITH CORONARY ANGIOGRAM;   Surgeon: Laurey Morale, MD;  Location: [facility] CATH LAB;  Service: Cardiovascular;  Laterality: N/A;   PERCUTANEOUS CORONARY ROTOBLATOR INTERVENTION (PCI-R)  01/07/2015   PERCUTANEOUS CORONARY ROTOBLATOR INTERVENTION (PCI-R) N/A 01/07/2015   Procedure: PERCUTANEOUS CORONARY ROTOBLATOR INTERVENTION (PCI-R);  Surgeon: Micheline Chapman, MD;  Location: [facility] CATH LAB;  Service: Cardiovascular;  Laterality: N/A;   POSTERIOR FUSION THORACIC SPINE  08/2015   POSTERIOR LUMBAR FUSION  01/2015   TOTAL KNEE ARTHROPLASTY Bilateral 1990's - 2000's    Past Medical History:  Diagnosis Date   Abnormal nuclear cardiac imaging test    Anemia, iron deficiency 07/02/2015   Anxiety    Arthritis    "knees, back, fingers, toes; joints" (01/07/2015)   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 difficile colitis 07/21/2022   CAD (coronary artery disease)    a. Abnl nuc 11/2014. Cath 12/2014 - turned down for CABG. Ultimately s/p TTVP, rotational atherectomy, PTCA and stenting of the ostial LCx and left main into the LAD (crush technique), and IVUS of the LAD/Left main. // b. Myoview 11/17: EF 48, poor quality/significant artifact; inf-lateral, inferior ischemia; Intermediate Risk   Chronic atrial fibrillation (HCC)    a.  First noted post-op 9/15 spinal fusion.  She had cardioversion, not on anticoagulation. Fall risk, unsteady. // failed DCCV // Holter 10/17: AFib, Avg HR 97, PVCs, no other arrhythmia   Chronic back pain greater than 3 months duration    a.  spinal stenosis.  Spinal fusion with rods in 2/15 at [facility] spinal fusion 9/15.   Chronic diastolic CHF    Echo 2/17:  EF 50-55%, trivial AI, midl MR, mod LAE, PASP 37 mmHg // Echocardiogram 03/2020: EF 45-50, no RWMA, mild LVH, mild reduced RVSF, mildly elevated PASP (RVSP 38.3), mild LAE, mild MR, mild to mod TR, trivial AI // Echo 9/21: EF 45, mild LVH, mildly reduced RV SF, moderate LAE, mild  RAE, mild MR, mild AI      Circadian rhythm sleep disorder    CTS (carpal tunnel syndrome)    Deficiency anemia 11/10/2014   Depression    Diarrhea    Diverticulitis of colon    Esophageal stricture    Gastritis    Gastroesophageal hernia 07/06/2013   GERD (gastroesophageal reflux disease)    Hiatal hernia    History of blood transfusion    "most of them related to OR's"    HTN (hypertension)    Hyperlipidemia    IBS (irritable bowel syndrome)    Incontinence 10/13/2012   Insomnia    Ischemic chest pain (HCC)    Memory disorder 12/04/2014   Metabolic syndrome 04/24/2014   MGUS (monoclonal gammopathy of unknown significance) dx'd 11/2014   a. Neg BMB 11/2014.   Multiple falls    Obesity    Obstructive sleep apnea    "have mask; don't wear it" (01/07/2015)   Orthostasis    PAT (paroxysmal atrial tachycardia)    Personal history of colonic polyps 10/25/2011 & 12/02/11   not retrieved Dr Victorino Dike & tubular adenomas   Pneumonia 03/2014   Sinus bradycardia    a. Baseline HR 50s-60s.   Stroke [facility]) early 2000's   "small"; denies residual on 01/07/2015)   Reviewed problem list, medications and allergies. Physical Assessment:   Vitals:   05/27/23 1044  BP: 124/86  Pulse: 84  Weight: 187 lb (84.8 kg)  Height: 5\' 6"  (1.676 m)  Body mass index is 30.18 kg/m.       Physical Examination:   General appearance: alert, well appearing, and in no distress  Psych: mood appropriate, normal affect  Skin: warm & dry   Cardiovascular: normal heart rate noted  Respiratory: normal respiratory effort, no distress  Abdomen: soft, non-tender   Pelvic: exam limited due to patient mobility.  Unable to place feet in stirrups.  VULVA: Mild diffuse erythema- no discrete mass or lesion noted.  Non-tender on exam.    Extremities: 1+ edema  Chaperone: Faith Rogue    Assessment & Plan:  1) Lichen sclerosus -continue with temovate twice per week -f/u prn or 1-2yr for follow  up   Return in about 1 year (around 05/26/2024) for 1-2 yr LS follow up.   Myna Hidalgo, DO Attending Obstetrician & Gynecologist, [facility] for [employer], [facility]

## 2023-05-27 NOTE — Progress Notes (Signed)
GYN VISIT Patient name: [name] [name] MRN [id number]  Date of birth: 1941/09/29 Chief Complaint:   Gynecologic Exam (Lichen sclerosus)  History of Present Illness:   [name] [name] is a 82 y.o. G74P2002 PM female being seen today for consultation due to LS.     Pt previously seen by Dr. Ambrose Mantle (recently retired).  She is currently using Temovate about 2 times per week.  She denies vaginal discharge itching or irritation.  Denies pelvic pain.  Reports no acute GYN concerns.  No LMP recorded. Patient is postmenopausal.    Review of Systems:   Pertinent items are noted in HPI  Pertinent History Reviewed:   Past Surgical History:  Procedure Laterality Date   BACK SURGERY     BONE MARROW BIOPSY  11/2014   CARDIAC CATHETERIZATION  12/27/2014   Procedure: INTRAVASCULAR PRESSURE WIRE/FFR STUDY;  Surgeon: Laurey Morale, MD;  Location: [facility] CATH LAB;  Service: Cardiovascular;;   CARDIAC CATHETERIZATION N/A 11/25/2016   Procedure: Left Heart Cath and Coronary Angiography;  Surgeon: Tonny Bollman, MD;  Location: [facility] INVASIVE CV LAB;  Service: Cardiovascular;  Laterality: N/A;   CARDIOVERSION N/A 02/13/2016   Procedure: CARDIOVERSION;  Surgeon: Wendall Stade, MD;  Location: [facility] ENDOSCOPY;  Service: Cardiovascular;  Laterality: N/A;   CARPAL TUNNEL RELEASE Right 1980's   CATARACT EXTRACTION Bilateral    CATARACT EXTRACTION W/ INTRAOCULAR LENS  IMPLANT, BILATERAL  2000's   CORONARY ANGIOPLASTY WITH STENT PLACEMENT  01/07/2015   "2"   CORONARY STENT PLACEMENT     DILATION AND CURETTAGE OF UTERUS     ESOPHAGOGASTRODUODENOSCOPY (EGD) WITH ESOPHAGEAL DILATION  "several times"   IR GASTROSTOMY TUBE REMOVAL  12/23/2022   IR REPLC GASTRO/COLONIC TUBE PERCUT W/FLUORO  09/08/2022   JOINT REPLACEMENT     KNEE ARTHROSCOPY Left 1995   LAPAROSCOPIC CHOLECYSTECTOMY  2003   LEFT HEART CATHETERIZATION WITH CORONARY ANGIOGRAM N/A 12/27/2014   Procedure: LEFT HEART CATHETERIZATION WITH CORONARY ANGIOGRAM;   Surgeon: Laurey Morale, MD;  Location: [facility] CATH LAB;  Service: Cardiovascular;  Laterality: N/A;   PERCUTANEOUS CORONARY ROTOBLATOR INTERVENTION (PCI-R)  01/07/2015   PERCUTANEOUS CORONARY ROTOBLATOR INTERVENTION (PCI-R) N/A 01/07/2015   Procedure: PERCUTANEOUS CORONARY ROTOBLATOR INTERVENTION (PCI-R);  Surgeon: Micheline Chapman, MD;  Location: [facility] CATH LAB;  Service: Cardiovascular;  Laterality: N/A;   POSTERIOR FUSION THORACIC SPINE  08/2015   POSTERIOR LUMBAR FUSION  01/2015   TOTAL KNEE ARTHROPLASTY Bilateral 1990's - 2000's    Past Medical History:  Diagnosis Date   Abnormal nuclear cardiac imaging test    Anemia, iron deficiency 07/02/2015   Anxiety    Arthritis    "knees, back, fingers, toes; joints" (01/07/2015)   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 difficile colitis 07/21/2022   CAD (coronary artery disease)    a. Abnl nuc 11/2014. Cath 12/2014 - turned down for CABG. Ultimately s/p TTVP, rotational atherectomy, PTCA and stenting of the ostial LCx and left main into the LAD (crush technique), and IVUS of the LAD/Left main. // b. Myoview 11/17: EF 48, poor quality/significant artifact; inf-lateral, inferior ischemia; Intermediate Risk   Chronic atrial fibrillation (HCC)    a.  First noted post-op 9/15 spinal fusion.  She had cardioversion, not on anticoagulation. Fall risk, unsteady. // failed DCCV // Holter 10/17: AFib, Avg HR 97, PVCs, no other arrhythmia   Chronic back pain greater than 3 months duration    a.  spinal stenosis.  Spinal fusion with rods in 2/15 at [facility] spinal fusion 9/15.   Chronic diastolic CHF    Echo 2/17:  EF 50-55%, trivial AI, midl MR, mod LAE, PASP 37 mmHg // Echocardiogram 03/2020: EF 45-50, no RWMA, mild LVH, mild reduced RVSF, mildly elevated PASP (RVSP 38.3), mild LAE, mild MR, mild to mod TR, trivial AI // Echo 9/21: EF 45, mild LVH, mildly reduced RV SF, moderate LAE, mild  RAE, mild MR, mild AI      Circadian rhythm sleep disorder    CTS (carpal tunnel syndrome)    Deficiency anemia 11/10/2014   Depression    Diarrhea    Diverticulitis of colon    Esophageal stricture    Gastritis    Gastroesophageal hernia 07/06/2013   GERD (gastroesophageal reflux disease)    Hiatal hernia    History of blood transfusion    "most of them related to OR's"    HTN (hypertension)    Hyperlipidemia    IBS (irritable bowel syndrome)    Incontinence 10/13/2012   Insomnia    Ischemic chest pain (HCC)    Memory disorder 12/04/2014   Metabolic syndrome 04/24/2014   MGUS (monoclonal gammopathy of unknown significance) dx'd 11/2014   a. Neg BMB 11/2014.   Multiple falls    Obesity    Obstructive sleep apnea    "have mask; don't wear it" (01/07/2015)   Orthostasis    PAT (paroxysmal atrial tachycardia)    Personal history of colonic polyps 10/25/2011 & 12/02/11   not retrieved Dr Victorino Dike & tubular adenomas   Pneumonia 03/2014   Sinus bradycardia    a. Baseline HR 50s-60s.   Stroke [facility]) early 2000's   "small"; denies residual on 01/07/2015)   Reviewed problem list, medications and allergies. Physical Assessment:   Vitals:   05/27/23 1044  BP: 124/86  Pulse: 84  Weight: 187 lb (84.8 kg)  Height: 5\' 6"  (1.676 m)  Body mass index is 30.18 kg/m.       Physical Examination:   General appearance: alert, well appearing, and in no distress  Psych: mood appropriate, normal affect  Skin: warm & dry   Cardiovascular: normal heart rate noted  Respiratory: normal respiratory effort, no distress  Abdomen: soft, non-tender   Pelvic: exam limited due to patient mobility.  Unable to place feet in stirrups.  VULVA: Mild diffuse erythema- no discrete mass or lesion noted.  Non-tender on exam.    Extremities: 1+ edema  Chaperone: Faith Rogue    Assessment & Plan:  1) Lichen sclr   No orders of the defined types were placed in this encounter.   Return in about  1 year (around 05/26/2024) for 1-2 yr LS follow up.   Myna Hidalgo, DO Attending Obstetrician & Gynecologist, [facility] for [employer], [facility]

## 2023-06-01 ENCOUNTER — Other Ambulatory Visit (HOSPITAL_COMMUNITY)
Admission: RE | Admit: 2023-06-01 | Discharge: 2023-06-01 | Disposition: A | Discharge status: Home / Self Care | Payer: Medicare HMO | Source: Skilled Nursing Facility | Attending: Adult Health | Admitting: Adult Health

## 2023-06-01 ENCOUNTER — Encounter: Payer: Self-pay | Admitting: Adult Health

## 2023-06-01 ENCOUNTER — Non-Acute Institutional Stay (SKILLED_NURSING_FACILITY): Payer: Medicare HMO | Admitting: Adult Health

## 2023-06-01 DIAGNOSIS — Z1152 Encounter for screening for COVID-19: Secondary | ICD-10-CM | POA: Diagnosis not present

## 2023-06-01 DIAGNOSIS — I700 Atherosclerosis of aorta: Secondary | ICD-10-CM | POA: Diagnosis not present

## 2023-06-01 DIAGNOSIS — R059 Cough, unspecified: Secondary | ICD-10-CM | POA: Diagnosis present

## 2023-06-01 DIAGNOSIS — R0989 Other specified symptoms and signs involving the circulatory and respiratory systems: Secondary | ICD-10-CM | POA: Diagnosis present

## 2023-06-01 DIAGNOSIS — N904 Leukoplakia of vulva: Secondary | ICD-10-CM

## 2023-06-01 DIAGNOSIS — D500 Iron deficiency anemia secondary to blood loss (chronic): Secondary | ICD-10-CM | POA: Diagnosis not present

## 2023-06-01 DIAGNOSIS — J189 Pneumonia, unspecified organism: Secondary | ICD-10-CM | POA: Diagnosis not present

## 2023-06-01 DIAGNOSIS — J3089 Other allergic rhinitis: Secondary | ICD-10-CM

## 2023-06-01 LAB — RESP PANEL BY RT-PCR (FLU A&B, COVID) ARPGX2
Influenza A by PCR: NEGATIVE
Influenza B by PCR: NEGATIVE
SARS Coronavirus 2 by RT PCR: NEGATIVE

## 2023-06-01 NOTE — Progress Notes (Unsigned)
Location:  [facility] Room Number: 159 p Place of Service:  SNF (31)   CODE STATUS: DNR  Allergies  Allergen Reactions   Buprenorphine Hcl Itching   Cymbalta [Duloxetine Hcl] Other (See Comments)    Other reaction(s): Other (See Comments) Personality changes - crying  2014   Morphine And Codeine Itching   Oxycodone-Acetaminophen Other (See Comments)    Personality changes    Valium [Diazepam] Other (See Comments)    Personality changes - "in another world" ;  Hallucinations  2015   Statins Other (See Comments)    Other reaction(s): Other (See Comments) Muscle weakness Muscle weakness   Morphine Itching   Altace [Ramipril] Other (See Comments)    unknown   Floxin [Ofloxacin] Other (See Comments)    unknown   Hydromorphone Other (See Comments)    Other reaction(s): Confusion (intolerance) unknown   Lovaza [Omega-3-Acid Ethyl Esters] Other (See Comments)    Muscle weakness    Trilipix [Choline Fenofibrate] Other (See Comments)    Muscle weakness    Zetia [Ezetimibe] Other (See Comments)    Muscle weakness     Chief Complaint  Patient presents with   Medical Management of Chronic Issues    Routine follow up   Quality Metric Gaps    Medicare annual wellness and mammogram due   Immunizations    Tdap vaccine due    HPI:    Past Medical History:  Diagnosis Date   Abnormal nuclear cardiac imaging test    Anemia, iron deficiency 07/02/2015   Anxiety    Arthritis    "knees, back, fingers, toes; joints" (01/07/2015)   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 difficile colitis 07/21/2022   CAD (coronary artery disease)    a. Abnl nuc 11/2014. Cath 12/2014 - turned down for CABG. Ultimately s/p TTVP, rotational atherectomy, PTCA and stenting of the ostial LCx and left main into the LAD (crush technique), and IVUS of the LAD/Left main. // b. Myoview 11/17: EF 48, poor  quality/significant artifact; inf-lateral, inferior ischemia; Intermediate Risk   Chronic atrial fibrillation (HCC)    a.  First noted post-op 9/15 spinal fusion.  She had cardioversion, not on anticoagulation. Fall risk, unsteady. // failed DCCV // Holter 10/17: AFib, Avg HR 97, PVCs, no other arrhythmia   Chronic back pain greater than 3 months duration    a. spinal stenosis.  Spinal fusion with rods in 2/15 at [facility] spinal fusion 9/15.   Chronic diastolic CHF    Echo 2/17:  EF 50-55%, trivial AI, midl MR, mod LAE, PASP 37 mmHg // Echocardiogram 03/2020: EF 45-50, no RWMA, mild LVH, mild reduced RVSF, mildly elevated PASP (RVSP 38.3), mild LAE, mild MR, mild to mod TR, trivial AI // Echo 9/21: EF 45, mild LVH, mildly reduced RV SF, moderate LAE, mild RAE, mild MR, mild AI      Circadian rhythm sleep disorder    CTS (carpal tunnel syndrome)    Deficiency anemia 11/10/2014   Depression    Diarrhea    Diverticulitis of colon    Esophageal stricture    Gastritis    Gastroesophageal hernia 07/06/2013   GERD (gastroesophageal reflux disease)    Hiatal hernia    History of blood transfusion    "most of them related to OR's"    HTN (hypertension)    Hyperlipidemia    IBS (irritable bowel syndrome)    Incontinence  10/13/2012   Insomnia    Ischemic chest pain (HCC)    Memory disorder 12/04/2014   Metabolic syndrome 04/24/2014   MGUS (monoclonal gammopathy of unknown significance) dx'd 11/2014   a. Neg BMB 11/2014.   Multiple falls    Obesity    Obstructive sleep apnea    "have mask; don't wear it" (01/07/2015)   Orthostasis    PAT (paroxysmal atrial tachycardia)    Personal history of colonic polyps 10/25/2011 & 12/02/11   not retrieved Dr Victorino Dike & tubular adenomas   Pneumonia 03/2014   Sinus bradycardia    a. Baseline HR 50s-60s.   Stroke [facility]) early 2000's   "small"; denies residual on 01/07/2015)    Past Surgical History:  Procedure Laterality Date   BACK  SURGERY     BONE MARROW BIOPSY  11/2014   CARDIAC CATHETERIZATION  12/27/2014   Procedure: INTRAVASCULAR PRESSURE WIRE/FFR STUDY;  Surgeon: Laurey Morale, MD;  Location: [facility] CATH LAB;  Service: Cardiovascular;;   CARDIAC CATHETERIZATION N/A 11/25/2016   Procedure: Left Heart Cath and Coronary Angiography;  Surgeon: Tonny Bollman, MD;  Location: [facility] INVASIVE CV LAB;  Service: Cardiovascular;  Laterality: N/A;   CARDIOVERSION N/A 02/13/2016   Procedure: CARDIOVERSION;  Surgeon: Wendall Stade, MD;  Location: [facility] ENDOSCOPY;  Service: Cardiovascular;  Laterality: N/A;   CARPAL TUNNEL RELEASE Right 1980's   CATARACT EXTRACTION Bilateral    CATARACT EXTRACTION W/ INTRAOCULAR LENS  IMPLANT, BILATERAL  2000's   CORONARY ANGIOPLASTY WITH STENT PLACEMENT  01/07/2015   "2"   CORONARY STENT PLACEMENT     DILATION AND CURETTAGE OF UTERUS     ESOPHAGOGASTRODUODENOSCOPY (EGD) WITH ESOPHAGEAL DILATION  "several times"   IR GASTROSTOMY TUBE REMOVAL  12/23/2022   IR REPLC GASTRO/COLONIC TUBE PERCUT W/FLUORO  09/08/2022   JOINT REPLACEMENT     KNEE ARTHROSCOPY Left 1995   LAPAROSCOPIC CHOLECYSTECTOMY  2003   LEFT HEART CATHETERIZATION WITH CORONARY ANGIOGRAM N/A 12/27/2014   Procedure: LEFT HEART CATHETERIZATION WITH CORONARY ANGIOGRAM;  Surgeon: Laurey Morale, MD;  Location: [facility] CATH LAB;  Service: Cardiovascular;  Laterality: N/A;   PERCUTANEOUS CORONARY ROTOBLATOR INTERVENTION (PCI-R)  01/07/2015   PERCUTANEOUS CORONARY ROTOBLATOR INTERVENTION (PCI-R) N/A 01/07/2015   Procedure: PERCUTANEOUS CORONARY ROTOBLATOR INTERVENTION (PCI-R);  Surgeon: Micheline Chapman, MD;  Location: [facility] CATH LAB;  Service: Cardiovascular;  Laterality: N/A;   POSTERIOR FUSION THORACIC SPINE  08/2015   POSTERIOR LUMBAR FUSION  01/2015   TOTAL KNEE ARTHROPLASTY Bilateral 1990's - 2000's    Social History   Socioeconomic History   Marital status: Widowed    Spouse name: Not on file   Number of children: 2   Years of education: HS    Highest education level: Not on file  Occupational History   Occupation: Comptroller- retired    Comment: retired   Occupation: retired  Tobacco Use   Smoking status: Never   Smokeless tobacco: Never  Vaping Use   Vaping Use: Never used  Substance and Sexual Activity   Alcohol use: No    Comment: 01/07/2015 "glass of wine at Christmas, maybe"   Drug use: No   Sexual activity: Not Currently  Other Topics Concern   Not on file  Social History Narrative   Patient is right handed   Long term resident of [facility]    Social Determinants of Health   Financial Resource Strain: Not on file  Food Insecurity: Not on file  Transportation Needs: Not on file  Physical Activity:  Inactive (04/04/2020)   Exercise Vital Sign    Days of Exercise per Week: 0 days    Minutes of Exercise per Session: 0 min  Stress: Stress Concern Present (04/04/2020)   [employer] of Occupational Health - Occupational Stress Questionnaire    Feeling of Stress : To some extent  Social Connections: Socially Isolated (04/04/2020)   Social Connection and Isolation Panel [NHANES]    Frequency of Communication with Friends and Family: Three times a week    Frequency of Social Gatherings with Friends and Family: Once a week    Attends Religious Services: Never    Database administrator or Organizations: No    Attends Banker Meetings: Never    Marital Status: Widowed  Catering manager Violence: Not on file   Family History  Problem Relation Age of Onset   Coronary artery disease Father    Peripheral vascular disease Father    Coronary artery disease Mother    Coronary artery disease Brother    Colon cancer Sister    Emphysema Sister    Sleep apnea Son    Colon cancer Sister        spread to her brain   Emphysema Brother    Dementia Neg Hx       VITAL SIGNS BP 131/77   Pulse 72   Temp 97.7 F (36.5 C)   Resp 20   Ht 5\' 6"  (1.676 m)   Wt 185 lb 6.4 oz (84.1 kg)   SpO2 98%   BMI 29.92  kg/m   Outpatient Encounter Medications as of 06/01/2023  Medication Sig   acetaminophen (TYLENOL) 325 MG tablet Take 650 mg by mouth every 8 (eight) hours.   aspirin EC 81 MG tablet Take 81 mg by mouth daily. Swallow whole.   busPIRone (BUSPAR) 10 MG tablet Take 10 mg by mouth 3 (three) times daily.   clobetasol (TEMOVATE) 0.05 % external solution Apply 1 application. topically 2 (two) times a week. Monday and Friday   diclofenac Sodium (VOLTAREN) 1 % GEL Apply 2 g topically 3 (three) times daily. to neck and shoulders for pain management   diltiazem (CARDIZEM) 120 MG tablet Take 120 mg by mouth every 8 (eight) hours.   DOXYCYCLINE HYCLATE PO Take 100 mg by mouth in the morning and at bedtime.   ELIQUIS 5 MG TABS tablet Take 1 tablet (5 mg total) by mouth 2 (two) times daily.   eszopiclone (LUNESTA) 2 MG TABS tablet Take 1 tablet (2 mg total) by mouth at bedtime.   fentaNYL (DURAGESIC) 25 MCG/HR Place 1 patch onto the skin every 3 (three) days.   FLUoxetine (PROZAC) 20 MG capsule Take 20 mg by mouth daily. Give with 40 mg to equal 60   FLUoxetine (PROZAC) 40 MG capsule Take 40 mg by mouth daily. Give with 20 mg to equal 60   furosemide (LASIX) 40 MG tablet Take 40 mg by mouth 2 (two) times daily. 9 am   gabapentin (NEURONTIN) 300 MG capsule Take 300 mg by mouth 3 (three) times daily.   ipratropium-albuterol (DUONEB) 0.5-2.5 (3) MG/3ML SOLN Take 3 mLs by nebulization every 6 (six) hours.   lidocaine 4 % Place 1 patch onto the skin in the morning and at bedtime.   loratadine (CLARITIN) 10 MG tablet Take 10 mg by mouth daily.   methocarbamol (ROBAXIN) 500 MG tablet Take 500 mg by mouth every 6 (six) hours as needed for muscle spasms.   miconazole (MICOTIN) 2 %  powder Apply 1 Application topically as directed. Every Shift - PRN; PRN 1, PRN 2, PRN 3   naloxone (NARCAN) nasal spray 4 mg/0.1 mL Special Instructions: As needed for depressed respirations less than 8/minute. Notify provider if given.  If no effectiveness or improvement, contact 911. As Needed   nitroGLYCERIN (NITROSTAT) 0.4 MG SL tablet Place 1 tablet (0.4 mg total) under the tongue every 5 (five) minutes as needed for chest pain (up to 3 doses).   omeprazole (PRILOSEC) 20 MG capsule Take 20 mg by mouth 2 (two) times daily before a meal.   ondansetron (ZOFRAN-ODT) 4 MG disintegrating tablet Take 4 mg by mouth every 6 (six) hours as needed for nausea or vomiting.   potassium chloride (MICRO-K) 10 MEQ CR capsule Take 20 mEq by mouth daily.   rOPINIRole (REQUIP) 0.5 MG tablet Take 0.5 mg by mouth in the morning and at bedtime.   Simethicone Extra Strength 125 MG CAPS Take 1 capsule by mouth every 6 (six) hours as needed.   traMADol (ULTRAM) 50 MG tablet Take 0.5 tablets (25 mg total) by mouth every 8 (eight) hours.   No facility-administered encounter medications on file as of 06/01/2023.     SIGNIFICANT DIAGNOSTIC EXAMS       ASSESSMENT/ PLAN:     Synthia Innocent NP [facility]  Contact [phone] Monday through Friday 8am- 5pm  After hours call [phone]

## 2023-06-02 ENCOUNTER — Encounter: Payer: Self-pay | Admitting: Adult Health

## 2023-06-02 ENCOUNTER — Non-Acute Institutional Stay (SKILLED_NURSING_FACILITY): Payer: Medicare HMO | Admitting: Adult Health

## 2023-06-02 DIAGNOSIS — I5032 Chronic diastolic (congestive) heart failure: Secondary | ICD-10-CM | POA: Diagnosis not present

## 2023-06-02 DIAGNOSIS — J3089 Other allergic rhinitis: Secondary | ICD-10-CM | POA: Insufficient documentation

## 2023-06-02 NOTE — Progress Notes (Signed)
Location:  [facility] Room Number: 159 P Place of Service:  SNF (31)   CODE STATUS: DNR  Allergies  Allergen Reactions   Buprenorphine Hcl Itching   Cymbalta [Duloxetine Hcl] Other (See Comments)    Other reaction(s): Other (See Comments) Personality changes - crying  2014   Morphine And Codeine Itching   Oxycodone-Acetaminophen Other (See Comments)    Personality changes    Valium [Diazepam] Other (See Comments)    Personality changes - "in another world" ;  Hallucinations  2015   Statins Other (See Comments)    Other reaction(s): Other (See Comments) Muscle weakness Muscle weakness   Morphine Itching   Altace [Ramipril] Other (See Comments)    unknown   Floxin [Ofloxacin] Other (See Comments)    unknown   Hydromorphone Other (See Comments)    Other reaction(s): Confusion (intolerance) unknown   Lovaza [Omega-3-Acid Ethyl Esters] Other (See Comments)    Muscle weakness    Trilipix [Choline Fenofibrate] Other (See Comments)    Muscle weakness    Zetia [Ezetimibe] Other (See Comments)    Muscle weakness     Chief Complaint  Patient presents with   Follow-up    Chest xray follow-up     HPI:  She is presently being treated for HCAP. Her chest x-ray demonstrates left pleural effusion; left lower lobe associated atelectasis. She continues to have some shortness of breath and a cough. There are no reports of fevers present. 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 difficile colitis 07/21/2022   CAD (coronary artery disease)    a. Abnl nuc 11/2014. Cath 12/2014 - turned down for CABG. Ultimately s/p TTVP,  rotational atherectomy, PTCA and stenting of the ostial LCx and left main into the LAD (crush technique), and IVUS of the LAD/Left main. // b. Myoview 11/17: EF 48, poor quality/significant artifact; inf-lateral, inferior ischemia; Intermediate Risk   Chronic atrial fibrillation (HCC)    a.  First noted post-op 9/15 spinal fusion.  She had cardioversion, not on anticoagulation. Fall risk, unsteady. // failed DCCV // Holter 10/17: AFib, Avg HR 97, PVCs, no other arrhythmia   Chronic back pain greater than 3 months duration    a. spinal stenosis.  Spinal fusion with rods in 2/15 at [facility] spinal fusion 9/15.   Chronic diastolic CHF    Echo 2/17:  EF 50-55%, trivial AI, midl MR, mod LAE, PASP 37 mmHg // Echocardiogram 03/2020: EF 45-50, no RWMA, mild LVH, mild reduced RVSF, mildly elevated PASP (RVSP 38.3), mild LAE, mild MR, mild to mod TR, trivial AI // Echo 9/21: EF 45, mild LVH, mildly reduced RV SF, moderate LAE, mild RAE, mild MR, mild AI      Circadian rhythm sleep disorder    CTS (carpal tunnel syndrome)    Deficiency anemia 11/10/2014   Depression    Diarrhea    Diverticulitis of colon    Esophageal stricture    Gastritis    Gastroesophageal hernia 07/06/2013   GERD (gastroesophageal reflux disease)    Hiatal hernia    History of blood transfusion    "most of  them related to OR's"    HTN (hypertension)    Hyperlipidemia    IBS (irritable bowel syndrome)    Incontinence 10/13/2012   Insomnia    Ischemic chest pain (HCC)    Memory disorder 12/04/2014   Metabolic syndrome 04/24/2014   MGUS (monoclonal gammopathy of unknown significance) dx'd 11/2014   a. Neg BMB 11/2014.   Multiple falls    Obesity    Obstructive sleep apnea    "have mask; don't wear it" (01/07/2015)   Orthostasis    PAT (paroxysmal atrial tachycardia)    Personal history of colonic polyps 10/25/2011 & 12/02/11   not retrieved Dr Victorino Dike & tubular adenomas   Pneumonia 03/2014   Sinus bradycardia     a. Baseline HR 50s-60s.   Stroke [facility]) early 2000's   "small"; denies residual on 01/07/2015)    Past Surgical History:  Procedure Laterality Date   BACK SURGERY     BONE MARROW BIOPSY  11/2014   CARDIAC CATHETERIZATION  12/27/2014   Procedure: INTRAVASCULAR PRESSURE WIRE/FFR STUDY;  Surgeon: Laurey Morale, MD;  Location: [facility] CATH LAB;  Service: Cardiovascular;;   CARDIAC CATHETERIZATION N/A 11/25/2016   Procedure: Left Heart Cath and Coronary Angiography;  Surgeon: Tonny Bollman, MD;  Location: [facility] INVASIVE CV LAB;  Service: Cardiovascular;  Laterality: N/A;   CARDIOVERSION N/A 02/13/2016   Procedure: CARDIOVERSION;  Surgeon: Wendall Stade, MD;  Location: [facility] ENDOSCOPY;  Service: Cardiovascular;  Laterality: N/A;   CARPAL TUNNEL RELEASE Right 1980's   CATARACT EXTRACTION Bilateral    CATARACT EXTRACTION W/ INTRAOCULAR LENS  IMPLANT, BILATERAL  2000's   CORONARY ANGIOPLASTY WITH STENT PLACEMENT  01/07/2015   "2"   CORONARY STENT PLACEMENT     DILATION AND CURETTAGE OF UTERUS     ESOPHAGOGASTRODUODENOSCOPY (EGD) WITH ESOPHAGEAL DILATION  "several times"   IR GASTROSTOMY TUBE REMOVAL  12/23/2022   IR REPLC GASTRO/COLONIC TUBE PERCUT W/FLUORO  09/08/2022   JOINT REPLACEMENT     KNEE ARTHROSCOPY Left 1995   LAPAROSCOPIC CHOLECYSTECTOMY  2003   LEFT HEART CATHETERIZATION WITH CORONARY ANGIOGRAM N/A 12/27/2014   Procedure: LEFT HEART CATHETERIZATION WITH CORONARY ANGIOGRAM;  Surgeon: Laurey Morale, MD;  Location: [facility] CATH LAB;  Service: Cardiovascular;  Laterality: N/A;   PERCUTANEOUS CORONARY ROTOBLATOR INTERVENTION (PCI-R)  01/07/2015   PERCUTANEOUS CORONARY ROTOBLATOR INTERVENTION (PCI-R) N/A 01/07/2015   Procedure: PERCUTANEOUS CORONARY ROTOBLATOR INTERVENTION (PCI-R);  Surgeon: Micheline Chapman, MD;  Location: [facility] CATH LAB;  Service: Cardiovascular;  Laterality: N/A;   POSTERIOR FUSION THORACIC SPINE  08/2015   POSTERIOR LUMBAR FUSION  01/2015   TOTAL KNEE ARTHROPLASTY Bilateral 1990's -  2000's    Social History   Socioeconomic History   Marital status: Widowed    Spouse name: Not on file   Number of children: 2   Years of education: HS   Highest education level: Not on file  Occupational History   Occupation: Comptroller- retired    Comment: retired   Occupation: retired  Tobacco Use   Smoking status: Never   Smokeless tobacco: Never  Vaping Use   Vaping Use: Never used  Substance and Sexual Activity   Alcohol use: No    Comment: 01/07/2015 "glass of wine at Christmas, maybe"   Drug use: No   Sexual activity: Not Currently  Other Topics Concern   Not on file  Social History Narrative   Patient is right handed   Long term resident of [facility]    Social Determinants of  Health   Financial Resource Strain: Not on file  Food Insecurity: Not on file  Transportation Needs: Not on file  Physical Activity: Inactive (04/04/2020)   Exercise Vital Sign    Days of Exercise per Week: 0 days    Minutes of Exercise per Session: 0 min  Stress: Stress Concern Present (04/04/2020)   [employer] of Occupational Health - Occupational Stress Questionnaire    Feeling of Stress : To some extent  Social Connections: Socially Isolated (04/04/2020)   Social Connection and Isolation Panel [NHANES]    Frequency of Communication with Friends and Family: Three times a week    Frequency of Social Gatherings with Friends and Family: Once a week    Attends Religious Services: Never    Database administrator or Organizations: No    Attends Banker Meetings: Never    Marital Status: Widowed  Catering manager Violence: Not on file   Family History  Problem Relation Age of Onset   Coronary artery disease Father    Peripheral vascular disease Father    Coronary artery disease Mother    Coronary artery disease Brother    Colon cancer Sister    Emphysema Sister    Sleep apnea Son    Colon cancer Sister        spread to her brain   Emphysema Brother    Dementia Neg Hx        VITAL SIGNS BP 131/77   Pulse 72   Temp 97.7 F (36.5 C)   Ht 5\' 6"  (1.676 m)   Wt 185 lb 6.4 oz (84.1 kg)   SpO2 98%   BMI 29.92 kg/m   Outpatient Encounter Medications as of 06/02/2023  Medication Sig   acetaminophen (TYLENOL) 325 MG tablet Take 650 mg by mouth 3 (three) times daily.   aspirin EC 81 MG tablet Take 81 mg by mouth daily. Swallow whole.   busPIRone (BUSPAR) 10 MG tablet Take 10 mg by mouth 3 (three) times daily.   clobetasol (TEMOVATE) 0.05 % external solution Apply 1 application. topically 2 (two) times a week. Monday and Friday   diclofenac Sodium (VOLTAREN) 1 % GEL Apply 2 g topically 3 (three) times daily. to neck and shoulders for pain management   diltiazem (CARDIZEM) 120 MG tablet Take 120 mg by mouth 3 (three) times daily.   DOXYCYCLINE HYCLATE PO Take 100 mg by mouth in the morning and at bedtime.   ELIQUIS 5 MG TABS tablet Take 1 tablet (5 mg total) by mouth 2 (two) times daily.   eszopiclone (LUNESTA) 2 MG TABS tablet Take 1 tablet (2 mg total) by mouth at bedtime.   fentaNYL (DURAGESIC) 25 MCG/HR Place 1 patch onto the skin every 3 (three) days.   FLUoxetine (PROZAC) 20 MG capsule Take 20 mg by mouth daily. Give with 40 mg to equal 60   FLUoxetine (PROZAC) 40 MG capsule Take 40 mg by mouth daily. Give with 20 mg to equal 60   furosemide (LASIX) 20 MG tablet Take 20 mg by mouth daily. @ 12 pm for pleural effusion see other listing also   furosemide (LASIX) 40 MG tablet Take 40 mg by mouth 2 (two) times daily. 9 am   gabapentin (NEURONTIN) 300 MG capsule Take 300 mg by mouth 3 (three) times daily.   ipratropium-albuterol (DUONEB) 0.5-2.5 (3) MG/3ML SOLN Take 3 mLs by nebulization every 6 (six) hours.   lidocaine 4 % Place 1 patch onto the skin in the  morning and at bedtime.   loratadine (CLARITIN) 10 MG tablet Take 10 mg by mouth daily.   methocarbamol (ROBAXIN) 500 MG tablet Take 500 mg by mouth every 6 (six) hours as needed for muscle spasms.    miconazole (MICOTIN) 2 % powder Apply 1 Application topically as directed. Every Shift - PRN; PRN 1, PRN 2, PRN 3   naloxone (NARCAN) nasal spray 4 mg/0.1 mL Special Instructions: As needed for depressed respirations less than 8/minute. Notify provider if given. If no effectiveness or improvement, contact 911. As Needed   nitroGLYCERIN (NITROSTAT) 0.4 MG SL tablet Place 1 tablet (0.4 mg total) under the tongue every 5 (five) minutes as needed for chest pain (up to 3 doses).   omeprazole (PRILOSEC) 20 MG capsule Take 20 mg by mouth 2 (two) times daily before a meal.   ondansetron (ZOFRAN-ODT) 4 MG disintegrating tablet Take 4 mg by mouth every 6 (six) hours as needed for nausea or vomiting.   potassium chloride (MICRO-K) 10 MEQ CR capsule Take 20 mEq by mouth daily.   rOPINIRole (REQUIP) 0.5 MG tablet Take 0.5 mg by mouth in the morning and at bedtime.   Simethicone Extra Strength 125 MG CAPS Take 1 capsule by mouth every 6 (six) hours as needed.   traMADol (ULTRAM) 50 MG tablet Take 0.5 tablets (25 mg total) by mouth every 8 (eight) hours.   No facility-administered encounter medications on file as of 06/02/2023.     SIGNIFICANT DIAGNOSTIC EXAMS   PREVIOUS   05-23-22: chest x-ray: mild congestive heart failure  07-14-22: chest x-ray:  Changes of bibasilar atelectasis  Nonspecific perihilar inflammatory changes Bilateral spinal fusion hardware   07-20-22: chest x-ray:  Nonspecific perihilar changes Emphysema No improvement compared to 07-14-22.   07-21-22: chest x-ray:  No acute pulmonary process.   09-21-22: swallow study:   Recommend regular textures vs mechanical soft per treating SLP at SNF and thin liquids via cup/straw, alternate solids and liquids, implement effortful/hard swallow with solids, and Pt to sit upright for all eating and drinking and 30+ minutes after (avoid bending over as well). Pt will likely need to proceed with solid foods cautiously to see if she experiences and  regurgitation. Recommend f/u with SLP services at SNF. Ok for PO medication whole with water.   TODAY  06-01-23: chest x-ray:  Left pleural effusion. Left lower lobe associated atelectasis; slightly worse compared to 03-25-23.   LABS REVIEWED: PREVIOUS   05-21-22: glucose 113; bun 19; creat 0.5; k+ 4.2; na++ 135; ca 8.5 gfr>60; protein 5.2; albumin 2.4  05-23-22: wbc 5.4; hgb 9.2; hct 29.6 plt 184; glucose 123; bun 21; creat 0.54; k+ 3.8; na++ 8.2; ca 8.2 gfr >60 07-01-22: glucose 91; bun 16; creat 0.44; k+ 4.1; na++ 135; ca 8.6; gfr>60 07-14-22: wbc 9.9; hgb 10.8; hct 35.3; mcv 83.8 plt 283; glucose 168; bun 24; creat 0.52; k+ 4.2; na++ 133; ca 8.6 gfr >60 urine culture:  blood culture: klebsiella pneumoniae  07-15-22: wbc 7.1; hgb 9.4; hct 31.2; mcv 85.2 plt 246; glucose 134; bun 28; creat 0.52; k+ 4.6; na++ 135; ca 8.2; gfr >60; protein 6.2 albumin 2.5; ast 50 alt 85 alk phos 283  07-19-22: blood culture: no growth  07-20-22: wbc 6.2; hgb 9.9; hct 33.2; mcv 84.9 plt 270; glucose 121; bun 24; creat 0.56; k+ 3.6; na++ 135; ca 8.5 gfr >60; protein 6.3 albumin 2.4; ast 31; alt 65; alk phos 286 07-21-22: c-dff + 07-23-22: wbc 10.6; hgb 35.1; hct 35.1; mcv  84.2 plt 244; glucose 113; bun 27; creat 0.57; k+ 4.6; na++ 135; ca 9.0; gfr >60; ast 44 alt 58; alk phos 258; protein 6.6; albumin 2.6 02-18-23: wbc 8.4; hgb 11.5; hct 36.6; mcv 88.8 plt 200; glucose 96; bun 11; creat 0.61; k+ 3.7; na++ 134; ca 8.5 gfr >60 alk phos 155; protein 6.6 albumin 2.9; chl 107; ldl 40 trig 58 hdl 55  NO NEW LABS.      Review of Systems  Constitutional:  Positive for malaise/fatigue. Negative for fever.  Respiratory:  Positive for cough, sputum production and shortness of breath.   Cardiovascular:  Negative for chest pain, palpitations and leg swelling.  Gastrointestinal:  Negative for abdominal pain, constipation and heartburn.  Musculoskeletal:  Negative for back pain, joint pain and myalgias.  Skin: Negative.   Neurological:   Negative for dizziness.  Psychiatric/Behavioral:  The patient is not nervous/anxious.    Physical Exam Constitutional:      General: She is not in acute distress.    Appearance: She is well-developed. She is not diaphoretic.  Neck:     Thyroid: No thyromegaly.  Cardiovascular:     Rate and Rhythm: Normal rate and regular rhythm.     Heart sounds: Normal heart sounds.  Pulmonary:     Effort: Pulmonary effort is normal. No respiratory distress.     Breath sounds: Rhonchi present.  Abdominal:     General: Bowel sounds are normal. There is no distension.     Palpations: Abdomen is soft.     Tenderness: There is no abdominal tenderness.  Musculoskeletal:        General: Normal range of motion.     Cervical back: Neck supple.     Right lower leg: No edema.     Left lower leg: No edema.  Lymphadenopathy:     Cervical: Cervical adenopathy present.  Skin:    General: Skin is warm and dry.  Neurological:     Mental Status: She is alert and oriented to person, place, and time.  Psychiatric:        Mood and Affect: Mood normal.       ASSESSMENT/ PLAN:  TODAY  Chronic diastolic congestive heart failure: will give lasix 20 mg daily for 3 days will repeat BMP on 06-04-23.    Synthia Innocent NP [facility]  call [phone]

## 2023-06-04 ENCOUNTER — Other Ambulatory Visit (HOSPITAL_COMMUNITY)
Admission: RE | Admit: 2023-06-04 | Discharge: 2023-06-04 | Disposition: A | Discharge status: Home / Self Care | Payer: Medicare HMO | Source: Skilled Nursing Facility | Attending: Adult Health | Admitting: Adult Health

## 2023-06-04 DIAGNOSIS — I5032 Chronic diastolic (congestive) heart failure: Secondary | ICD-10-CM | POA: Insufficient documentation

## 2023-06-04 LAB — BASIC METABOLIC PANEL
Anion gap: 9 (ref 5–15)
BUN: 17 mg/dL (ref 8–23)
CO2: 27 mmol/L (ref 22–32)
Calcium: 8.7 mg/dL — ABNORMAL LOW (ref 8.9–10.3)
Chloride: 101 mmol/L (ref 98–111)
Creatinine, Ser: 0.63 mg/dL (ref 0.44–1.00)
GFR, Estimated: >60 mL/min (ref >60)
Glucose, Bld: 101 mg/dL — ABNORMAL HIGH (ref 70–99)
Potassium: 3.8 mmol/L (ref 3.5–5.1)
Sodium: 137 mmol/L (ref 135–145)

## 2023-06-14 NOTE — Progress Notes (Unsigned)
Cardiology Office Note:  .   Date:  06/15/2023  ID:  [name] [name], DOB 12/21/1941, MRN [id number] PCP: Sharee Holster, NP  [facility] Providers Cardiologist:  Tonny Bollman, MD Cardiology APP:  Beatrice Lecher, PA-C  Electrophysiologist:  Lanier Prude, MD    History of Present Illness: .   [name] [name] is a 82 y.o. female      Coronary artery disease Cath 12/15: Complex bifurcational distal LM/ostial LCx disease - poor CABG candidate PCI 12/15: DES to distal LM and ostial LCx Myoview 11/17: + Inferior ischemia Cath 12/17: Patent LM/LCx stent; no significant RCA disease Myoview 6/21: No ischemia Primarily Diastolic CHF Echocardiogram 4/21: EF 45-50, mild LVH, mild MR, mild-mod TR, trivial AI Echo 8/21: EF 45>>Dilt DC'd due to low EF>>resumed by EP in 10/21 Permanent atrial fibrillation S/p DCCV 1/17 >> ERAF  >> rate control strategy Intol of beta-blocker, digoxin  Eval by EP (Dr. Lalla Brothers) in 10/21>>EF felt to be fairly stable>>Diltiazem resumed Monitor 10/21: Avg HR 92 Hypertension Hyperlipidemia S/p multiple back surgeries; managed with chronic narcotic pain medication Hx of orthostatic hypotension; s/p multiple falls in the past Hepatic steatosis (elevated LFTs) MGUS Hx of CVA     ROS: Is here today alone from SNF, traveling in a wheelchair. Says she feels terrible, has chronic back pain. Has chronic bilateral LE edema, occasional shortness of breath. Walks around her room for exercise, cannot walk for long and has to use a walker. No falls. Short bouts of lightheadedness on occasion, no presyncope or syncope. Has had pneumonia 3 times in 15 months. Is unable to go out of her room much 2/2 frequent urination. Would like to participate in more activities at the SNF.   Studies Reviewed: .         Risk Assessment/Calculations:    CHA2DS2-VASc Score = 8   This indicates a 10.8% annual risk of stroke. The patient's score is based upon: CHF History:  1 HTN History: 1 Diabetes History: 0 Stroke History: 2 Vascular Disease History: 1 Age Score: 2 Gender Score: 1            Physical Exam:   VS:  BP 122/66   Pulse 84   Ht 5\' 7"  (1.702 m)   Wt 191 lb 3.2 oz (86.7 kg)   SpO2 93%   BMI 29.95 kg/m     Wt Readings from Last 3 Encounters:  06/15/23 191 lb 3.2 oz (86.7 kg)  06/02/23 185 lb 6.4 oz (84.1 kg)  06/01/23 185 lb 6.4 oz (84.1 kg)    GEN: Well nourished, well developed in no acute distress NECK: No JVD; No carotid bruits CARDIAC: Irregular RR, no murmurs, rubs, gallops RESPIRATORY:  Clear to auscultation without rales, wheezing or rhonchi  ABDOMEN: Soft, non-tender, non-distended EXTREMITIES:  No edema; No deformity   ASSESSMENT AND PLAN: .    CAD without angina: History of complex bifurcational distal LM/ostial LCx disease felt to be poor CABG candidate with PCI to distal LM and ostial LCx. Cath 11/2016 with patent LM/LCx stent, and no significant RCA disease.  Occasional shortness of breath. She denies chest pain, dyspnea, or other symptoms concerning for angina. No indication for further ischemic evaluation at this time. Has remained on aspirin given history of left main stenting. No bleeding concerns. We will get CBC today.  Chronic edema: Has bilateral LE edema. Is currently on Lasix 40 mg twice daily.  She does not note significant improvement in leg edema  despite brisk urine output. Does not like the way compression stockings feel on her legs. I encouraged her to continue to use these as they are beneficial. Have included request for SNF staff to elevated the foot of her bed 3 times daily for 20 minutes.  Is spending all day in her room due to frequent urination. Edema likely multifactorial including sedentary lifestyle, venous insufficiency , and mildly reduced LVEF. Does not elevate her legs consistently. We will reduce Lasix to 40 mg every am in hopes to give her more flexibility in participating in activities at SNF.    Chronic HFmrEF: LVEF 50 to 55%, mild LVH, indeterminate diastolic parameters on echo 10/27/2021.  She has chronic LE edema as noted above. No orthopnea, PND, DOE. Weight is stable. No evidence of volume overload on exam.   Permanent atrial fibrillation on chronic anticoagulation: HR is well controlled. No palpitations or episodes of tachycardia. Overall asymptomatic. Scr 0.630 on 06/04/23. Continue Eliquis 5 mg twice daily which is appropriate dose for stroke prevention for CHA2DS2-VASc score of 8. Continue diltiazem for rate control. We will check CBC today.  Essential hypertension: BP is well controlled. Stable renal function on lab work completed 06/04/23.        Dispo: Keep your December appointment with Dr. Excell Seltzer  Signed, Lavontay Kirk, Zachary George, NP

## 2023-06-15 ENCOUNTER — Encounter: Payer: Self-pay | Admitting: Nurse Practitioner

## 2023-06-15 ENCOUNTER — Ambulatory Visit: Payer: Medicare HMO | Attending: Nurse Practitioner | Admitting: Nurse Practitioner

## 2023-06-15 VITALS — BP 122/66 | HR 84 | Ht 67.0 in | Wt 191.2 lb

## 2023-06-15 DIAGNOSIS — Z7901 Long term (current) use of anticoagulants: Secondary | ICD-10-CM | POA: Diagnosis not present

## 2023-06-15 DIAGNOSIS — I2510 Atherosclerotic heart disease of native coronary artery without angina pectoris: Secondary | ICD-10-CM | POA: Diagnosis not present

## 2023-06-15 DIAGNOSIS — R609 Edema, unspecified: Secondary | ICD-10-CM

## 2023-06-15 DIAGNOSIS — I10 Essential (primary) hypertension: Secondary | ICD-10-CM

## 2023-06-15 DIAGNOSIS — I4821 Permanent atrial fibrillation: Secondary | ICD-10-CM | POA: Diagnosis not present

## 2023-06-15 DIAGNOSIS — I5020 Unspecified systolic (congestive) heart failure: Secondary | ICD-10-CM

## 2023-06-15 LAB — CBC WITH DIFFERENTIAL/PLATELET
Basophils Absolute: 0 10*3/uL
Basos: 0 %
EOS (ABSOLUTE): 0.1 10*3/uL
Eos: 1 %
Hematocrit: 35.5 %
Hemoglobin: 11.5 g/dL
Lymphocytes Absolute: 1.9 10*3/uL
Lymphs: 29 %
MCH: 27.4 pg
MCHC: 32.4 g/dL
MCV: 85 fL
Monocytes Absolute: 0.6 10*3/uL
Monocytes: 9 %
Neutrophils Absolute: 4 10*3/uL
Neutrophils: 61 %
Platelets: 199 10*3/uL (ref 150–450)
RBC: 4.2 x10E6/uL
RDW: 16.3 %
WBC: 6.5 10*3/uL (ref 3.4–10.8)

## 2023-06-15 NOTE — Patient Instructions (Signed)
Medication Instructions:  CHANGE Lasix 1 tablet 40mg  every morning.  *If you need a refill on your cardiac medications before your next appointment, please call your pharmacy*   Lab Work: CBC today.  If you have labs (blood work) drawn today and your tests are completely normal, you will receive your results only by: MyChart Message (if you have MyChart) OR A paper copy in the mail If you have any lab test that is abnormal or we need to change your treatment, we will call you to review the results.   Testing/Procedures: None   Follow-Up: At [facility], you and your health needs are our priority.  As part of our continuing mission to provide you with exceptional heart care, we have created designated Provider Care Teams.  These Care Teams include your primary Cardiologist (physician) and Advanced Practice Providers (APPs -  Physician Assistants and Nurse Practitioners) who all work together to provide you with the care you need, when you need it.  We recommend signing up for the patient portal called "MyChart".  Sign up information is provided on this After Visit Summary.  MyChart is used to connect with patients for Virtual Visits (Telemedicine).  Patients are able to view lab/test results, encounter notes, upcoming appointments, etc.  Non-urgent messages can be sent to your provider as well.   To learn more about what you can do with MyChart, go to [email or web address].    Your next appointment:   6 month(s)  Provider:   Tonny Bollman, MD     Other Instructions  Your provider recommends you to wear compression hose.   For your  leg edema you  should do  the following 1. Leg elevation - I recommend the Lounge Dr. Leg rest.  See below for details  2. Salt restriction  -  Use potassium chloride instead of regular salt as a salt substitute. 3. Walk regularly 4. Compression hose - Medical Supply store  5. Weight loss    Available on [email or web address] Or  Go to  [email or web address]

## 2023-06-18 ENCOUNTER — Inpatient Hospital Stay (HOSPITAL_COMMUNITY): Payer: Medicare HMO

## 2023-06-18 ENCOUNTER — Emergency Department (HOSPITAL_COMMUNITY): Payer: Medicare HMO

## 2023-06-18 ENCOUNTER — Encounter (HOSPITAL_COMMUNITY): Payer: Self-pay | Admitting: Emergency Medicine

## 2023-06-18 ENCOUNTER — Inpatient Hospital Stay (HOSPITAL_COMMUNITY)
Admission: EM | Admit: 2023-06-18 | Discharge: 2023-07-22 | DRG: 177 | Disposition: E | Payer: Medicare HMO | Attending: Family Medicine | Admitting: Family Medicine

## 2023-06-18 ENCOUNTER — Other Ambulatory Visit: Payer: Self-pay

## 2023-06-18 DIAGNOSIS — K219 Gastro-esophageal reflux disease without esophagitis: Secondary | ICD-10-CM | POA: Diagnosis present

## 2023-06-18 DIAGNOSIS — I4821 Permanent atrial fibrillation: Secondary | ICD-10-CM | POA: Diagnosis present

## 2023-06-18 DIAGNOSIS — F339 Major depressive disorder, recurrent, unspecified: Secondary | ICD-10-CM | POA: Diagnosis present

## 2023-06-18 DIAGNOSIS — E871 Hypo-osmolality and hyponatremia: Secondary | ICD-10-CM | POA: Diagnosis present

## 2023-06-18 DIAGNOSIS — E785 Hyperlipidemia, unspecified: Secondary | ICD-10-CM | POA: Diagnosis present

## 2023-06-18 DIAGNOSIS — I5032 Chronic diastolic (congestive) heart failure: Secondary | ICD-10-CM | POA: Diagnosis present

## 2023-06-18 DIAGNOSIS — K8689 Other specified diseases of pancreas: Secondary | ICD-10-CM | POA: Diagnosis present

## 2023-06-18 DIAGNOSIS — G894 Chronic pain syndrome: Secondary | ICD-10-CM | POA: Diagnosis present

## 2023-06-18 DIAGNOSIS — Z8673 Personal history of transient ischemic attack (TIA), and cerebral infarction without residual deficits: Secondary | ICD-10-CM

## 2023-06-18 DIAGNOSIS — G2581 Restless legs syndrome: Secondary | ICD-10-CM | POA: Diagnosis present

## 2023-06-18 DIAGNOSIS — Z79899 Other long term (current) drug therapy: Secondary | ICD-10-CM

## 2023-06-18 DIAGNOSIS — R1310 Dysphagia, unspecified: Secondary | ICD-10-CM

## 2023-06-18 DIAGNOSIS — E876 Hypokalemia: Secondary | ICD-10-CM | POA: Diagnosis present

## 2023-06-18 DIAGNOSIS — Z7982 Long term (current) use of aspirin: Secondary | ICD-10-CM

## 2023-06-18 DIAGNOSIS — R1319 Other dysphagia: Secondary | ICD-10-CM | POA: Diagnosis not present

## 2023-06-18 DIAGNOSIS — Z825 Family history of asthma and other chronic lower respiratory diseases: Secondary | ICD-10-CM

## 2023-06-18 DIAGNOSIS — Z881 Allergy status to other antibiotic agents status: Secondary | ICD-10-CM

## 2023-06-18 DIAGNOSIS — J9601 Acute respiratory failure with hypoxia: Secondary | ICD-10-CM | POA: Diagnosis present

## 2023-06-18 DIAGNOSIS — Z515 Encounter for palliative care: Secondary | ICD-10-CM | POA: Diagnosis not present

## 2023-06-18 DIAGNOSIS — Z7901 Long term (current) use of anticoagulants: Secondary | ICD-10-CM | POA: Diagnosis not present

## 2023-06-18 DIAGNOSIS — K429 Umbilical hernia without obstruction or gangrene: Secondary | ICD-10-CM | POA: Diagnosis present

## 2023-06-18 DIAGNOSIS — E869 Volume depletion, unspecified: Secondary | ICD-10-CM | POA: Diagnosis present

## 2023-06-18 DIAGNOSIS — I10 Essential (primary) hypertension: Secondary | ICD-10-CM | POA: Diagnosis present

## 2023-06-18 DIAGNOSIS — Z800 Family history of malignant neoplasm of digestive organs: Secondary | ICD-10-CM

## 2023-06-18 DIAGNOSIS — Z961 Presence of intraocular lens: Secondary | ICD-10-CM | POA: Diagnosis present

## 2023-06-18 DIAGNOSIS — K222 Esophageal obstruction: Secondary | ICD-10-CM

## 2023-06-18 DIAGNOSIS — Z1152 Encounter for screening for COVID-19: Secondary | ICD-10-CM

## 2023-06-18 DIAGNOSIS — Z955 Presence of coronary angioplasty implant and graft: Secondary | ICD-10-CM

## 2023-06-18 DIAGNOSIS — I110 Hypertensive heart disease with heart failure: Secondary | ICD-10-CM | POA: Diagnosis present

## 2023-06-18 DIAGNOSIS — J690 Pneumonitis due to inhalation of food and vomit: Principal | ICD-10-CM | POA: Diagnosis present

## 2023-06-18 DIAGNOSIS — R5381 Other malaise: Secondary | ICD-10-CM | POA: Diagnosis present

## 2023-06-18 DIAGNOSIS — Z885 Allergy status to narcotic agent status: Secondary | ICD-10-CM

## 2023-06-18 DIAGNOSIS — I2510 Atherosclerotic heart disease of native coronary artery without angina pectoris: Secondary | ICD-10-CM | POA: Diagnosis present

## 2023-06-18 DIAGNOSIS — Z7189 Other specified counseling: Secondary | ICD-10-CM | POA: Diagnosis not present

## 2023-06-18 DIAGNOSIS — Z888 Allergy status to other drugs, medicaments and biological substances status: Secondary | ICD-10-CM

## 2023-06-18 DIAGNOSIS — I4891 Unspecified atrial fibrillation: Secondary | ICD-10-CM | POA: Diagnosis not present

## 2023-06-18 DIAGNOSIS — Z6828 Body mass index (BMI) 28.0-28.9, adult: Secondary | ICD-10-CM

## 2023-06-18 DIAGNOSIS — Z9049 Acquired absence of other specified parts of digestive tract: Secondary | ICD-10-CM

## 2023-06-18 DIAGNOSIS — F419 Anxiety disorder, unspecified: Secondary | ICD-10-CM | POA: Diagnosis present

## 2023-06-18 DIAGNOSIS — R350 Frequency of micturition: Secondary | ICD-10-CM | POA: Diagnosis present

## 2023-06-18 DIAGNOSIS — Z981 Arthrodesis status: Secondary | ICD-10-CM

## 2023-06-18 DIAGNOSIS — K5900 Constipation, unspecified: Secondary | ICD-10-CM | POA: Diagnosis present

## 2023-06-18 DIAGNOSIS — E43 Unspecified severe protein-calorie malnutrition: Secondary | ICD-10-CM | POA: Diagnosis present

## 2023-06-18 DIAGNOSIS — Z66 Do not resuscitate: Secondary | ICD-10-CM | POA: Diagnosis present

## 2023-06-18 DIAGNOSIS — R7989 Other specified abnormal findings of blood chemistry: Secondary | ICD-10-CM | POA: Diagnosis not present

## 2023-06-18 DIAGNOSIS — Z8249 Family history of ischemic heart disease and other diseases of the circulatory system: Secondary | ICD-10-CM

## 2023-06-18 DIAGNOSIS — K580 Irritable bowel syndrome with diarrhea: Secondary | ICD-10-CM | POA: Diagnosis present

## 2023-06-18 DIAGNOSIS — F1120 Opioid dependence, uncomplicated: Secondary | ICD-10-CM | POA: Diagnosis present

## 2023-06-18 DIAGNOSIS — Z96653 Presence of artificial knee joint, bilateral: Secondary | ICD-10-CM | POA: Diagnosis present

## 2023-06-18 LAB — CBC WITH DIFFERENTIAL/PLATELET
Abs Immature Granulocytes: 0.02 10*3/uL (ref 0.00–0.07)
Basophils Absolute: 0 10*3/uL (ref 0.0–0.1)
Basophils Relative: 0 %
Eosinophils Absolute: 0 10*3/uL (ref 0.0–0.5)
Eosinophils Relative: 0 %
HCT: 36.3 % (ref 36.0–46.0)
Hemoglobin: 11.6 g/dL — ABNORMAL LOW (ref 12.0–15.0)
Immature Granulocytes: 0 %
Lymphocytes Relative: 17 %
Lymphs Abs: 1.1 10*3/uL (ref 0.7–4.0)
MCH: 28 pg (ref 26.0–34.0)
MCHC: 32 g/dL (ref 30.0–36.0)
MCV: 87.5 fL (ref 80.0–100.0)
Monocytes Absolute: 0.3 10*3/uL (ref 0.1–1.0)
Monocytes Relative: 5 %
Neutro Abs: 5 10*3/uL (ref 1.7–7.7)
Neutrophils Relative %: 78 %
Platelets: 171 10*3/uL (ref 150–400)
RBC: 4.15 MIL/uL (ref 3.87–5.11)
RDW: 15.6 % — ABNORMAL HIGH (ref 11.5–15.5)
WBC: 6.5 10*3/uL (ref 4.0–10.5)
nRBC: 0 % (ref 0.0–0.2)

## 2023-06-18 LAB — BRAIN NATRIURETIC PEPTIDE: B Natriuretic Peptide: 521 pg/mL — ABNORMAL HIGH (ref 0.0–100.0)

## 2023-06-18 LAB — COMPREHENSIVE METABOLIC PANEL
ALT: 210 U/L — ABNORMAL HIGH (ref 0–44)
AST: 216 U/L — ABNORMAL HIGH (ref 15–41)
Albumin: 3.4 g/dL — ABNORMAL LOW (ref 3.5–5.0)
Alkaline Phosphatase: 250 U/L — ABNORMAL HIGH (ref 38–126)
Anion gap: 10 (ref 5–15)
BUN: 10 mg/dL (ref 8–23)
CO2: 24 mmol/L (ref 22–32)
Calcium: 8.3 mg/dL — ABNORMAL LOW (ref 8.9–10.3)
Chloride: 97 mmol/L — ABNORMAL LOW (ref 98–111)
Creatinine, Ser: 0.5 mg/dL (ref 0.44–1.00)
GFR, Estimated: >60 mL/min (ref >60)
Glucose, Bld: 130 mg/dL — ABNORMAL HIGH (ref 70–99)
Potassium: 3.7 mmol/L (ref 3.5–5.1)
Sodium: 131 mmol/L — ABNORMAL LOW (ref 135–145)
Total Bilirubin: 0.8 mg/dL (ref 0.3–1.2)
Total Protein: 6.9 g/dL (ref 6.5–8.1)

## 2023-06-18 LAB — LACTIC ACID, PLASMA
Lactic Acid, Venous: 1 mmol/L (ref 0.5–1.9)
Lactic Acid, Venous: 0.9 mmol/L (ref 0.5–1.9)

## 2023-06-18 LAB — PROCALCITONIN: Procalcitonin: 0.11 ng/mL

## 2023-06-18 LAB — HEPATITIS PANEL, ACUTE
HCV Ab: NONREACTIVE
Hep A IgM: NONREACTIVE
Hep B C IgM: NONREACTIVE
Hepatitis B Surface Ag: NONREACTIVE

## 2023-06-18 LAB — TSH: TSH: 0.937 u[IU]/mL (ref 0.350–4.500)

## 2023-06-18 MED ORDER — ACETAMINOPHEN 325 MG PO TABS: 650.0000 mg | ORAL_TABLET | Freq: Four times a day (QID) | ORAL | Status: DC | PRN

## 2023-06-18 MED ORDER — PIPERACILLIN-TAZOBACTAM 3.375 G IVPB
3.3750 g | Freq: Three times a day (TID) | INTRAVENOUS | Status: DC
  Administered 2023-06-18 – 2023-06-22 (×12): 3.375 g via INTRAVENOUS
  Filled 2023-06-18 (×12): qty 50

## 2023-06-18 MED ORDER — FENTANYL CITRATE PF 50 MCG/ML IJ SOSY
25.0000 ug | PREFILLED_SYRINGE | Freq: Once | INTRAMUSCULAR | Status: AC
  Administered 2023-06-18: 25 ug via INTRAVENOUS
  Filled 2023-06-18: qty 1

## 2023-06-18 MED ORDER — TRAMADOL HCL 50 MG PO TABS: 50.0000 mg | ORAL_TABLET | Freq: Once | ORAL | Status: DC

## 2023-06-18 MED ORDER — HEPARIN (PORCINE) 25000 UT/250ML-% IV SOLN
1000.0000 [IU]/h | INTRAVENOUS | Status: DC
  Administered 2023-06-18: 1100 [IU]/h via INTRAVENOUS
  Administered 2023-06-19: 950 [IU]/h via INTRAVENOUS
  Administered 2023-06-20: 1000 [IU]/h via INTRAVENOUS
  Filled 2023-06-18 (×5): qty 250

## 2023-06-18 MED ORDER — ONDANSETRON HCL 4 MG PO TABS: 4.0000 mg | ORAL_TABLET | Freq: Four times a day (QID) | ORAL | Status: DC | PRN

## 2023-06-18 MED ORDER — ONDANSETRON HCL 4 MG/2ML IJ SOLN
4.0000 mg | Freq: Once | INTRAMUSCULAR | Status: AC
  Administered 2023-06-18: 4 mg via INTRAVENOUS

## 2023-06-18 MED ORDER — DILTIAZEM HCL 25 MG/5ML IV SOLN
10.0000 mg | Freq: Once | INTRAVENOUS | Status: AC
  Administered 2023-06-18: 10 mg via INTRAVENOUS
  Filled 2023-06-18: qty 5

## 2023-06-18 MED ORDER — HEPARIN BOLUS VIA INFUSION
3000.0000 [IU] | Freq: Once | INTRAVENOUS | Status: AC
  Administered 2023-06-18: 3000 [IU] via INTRAVENOUS

## 2023-06-18 MED ORDER — FENTANYL 25 MCG/HR TD PT72
1.0000 | MEDICATED_PATCH | TRANSDERMAL | Status: DC
  Administered 2023-06-18: 1 via TRANSDERMAL
  Filled 2023-06-18: qty 1

## 2023-06-18 MED ORDER — ROPINIROLE HCL 0.25 MG PO TABS: 0.5000 mg | ORAL_TABLET | Freq: Every day | ORAL | Status: DC

## 2023-06-18 MED ORDER — SODIUM CHLORIDE 0.9 % IV SOLN
2.0000 g | Freq: Once | INTRAVENOUS | Status: AC
  Administered 2023-06-18: 2 g via INTRAVENOUS
  Filled 2023-06-18: qty 20

## 2023-06-18 MED ORDER — ONDANSETRON HCL 4 MG/2ML IJ SOLN
4.0000 mg | Freq: Four times a day (QID) | INTRAMUSCULAR | Status: DC | PRN
  Administered 2023-06-18 – 2023-06-22 (×3): 4 mg via INTRAVENOUS
  Filled 2023-06-18 (×3): qty 2

## 2023-06-18 MED ORDER — SODIUM CHLORIDE 0.9 % IV BOLUS
500.0000 mL | Freq: Once | INTRAVENOUS | Status: AC
  Administered 2023-06-18: 500 mL via INTRAVENOUS

## 2023-06-18 MED ORDER — BUSPIRONE HCL 5 MG PO TABS
10.0000 mg | ORAL_TABLET | Freq: Three times a day (TID) | ORAL | Status: DC
  Filled 2023-06-18: qty 2

## 2023-06-18 MED ORDER — SODIUM CHLORIDE 0.9 % IV SOLN
500.0000 mg | Freq: Once | INTRAVENOUS | Status: AC
  Administered 2023-06-18: 500 mg via INTRAVENOUS
  Filled 2023-06-18: qty 5

## 2023-06-18 MED ORDER — POTASSIUM CHLORIDE IN NACL 20-0.9 MEQ/L-% IV SOLN
INTRAVENOUS | Status: AC
  Filled 2023-06-18: qty 1000

## 2023-06-18 MED ORDER — ACETAMINOPHEN 650 MG RE SUPP: 650.0000 mg | Freq: Four times a day (QID) | RECTAL | Status: DC | PRN

## 2023-06-18 MED ORDER — FENTANYL CITRATE PF 50 MCG/ML IJ SOSY
25.0000 ug | PREFILLED_SYRINGE | INTRAMUSCULAR | Status: DC | PRN
  Administered 2023-06-18 – 2023-06-30 (×24): 25 ug via INTRAVENOUS
  Filled 2023-06-18 (×24): qty 1

## 2023-06-18 MED ORDER — DILTIAZEM HCL-DEXTROSE 125-5 MG/125ML-% IV SOLN (PREMIX)
5.0000 mg/h | INTRAVENOUS | Status: DC
  Administered 2023-06-18 – 2023-06-19 (×2): 5 mg/h via INTRAVENOUS
  Administered 2023-06-19 – 2023-06-20 (×3): 10 mg/h via INTRAVENOUS
  Administered 2023-06-21 – 2023-06-22 (×2): 5 mg/h via INTRAVENOUS
  Filled 2023-06-18 (×7): qty 125

## 2023-06-18 NOTE — Consult Note (Addendum)
Gastroenterology Consult   Referring Provider: No ref. provider found Primary Care Physician:  Sharee Holster, NP Primary Gastroenterologist:  Jordan Likes  Patient ID: [name] [name]; [id number]; [birth year]   Admit date: 06/18/2023  LOS: 0 days   Date of Consultation: 06/18/2023  Reason for Consultation:  dysphagia    History of Present Illness   [name] [name] is a 82 y.o. female with chronic back pain, chronic lower extremity edema, CAD, permanent A-fib, chronic HFmrEF, hypertension, Cdiff, Stroke, MGUS, hyperlipidemia, restless leg syndrome, esophageal stricture, paraesophageal hernia status post postsurgical repair, depression/anxiety presenting with increasing difficulty swallowing over the past month, worse over the past 2 days.  Complaining of lower abdominal pain.  Patient underwent paraesophageal hernia repair, Heller myotomy, gastropexy at [facility] in 01/2022. Achalasia could not be excluded.  For period of time she required gastrostomy tube.  This was removed January 2024. See details below. Over the past several months, worsening dysphagia.  Worsening despite crushing her pills and eating softer food.  Over the past few days she has had increasing regurgitation, hypersalivation, dry heaving, difficulty tolerating medication.  Not been able to take her cardiac medications or pain medications. Per daughter, she has had aspiration PNA at least 4-5 times, just completed course of doxycycline on 06/16/23.   Today is first day she has reported abdominal pain, LLQ. Typically BMs regular, none in 2 days. No melena, brbpr. She does not really want a feeding tube, had terrible diarrhea even after switching from bolus to continuous feedings and trying different supplements.   In the ED: Afebrile, tachycardic in the 140s otherwise hemodynamically stable.  O2 sats of 87% on 2 L via nasal cannula.  Hemoglobin 9.6, sodium 131, creatinine 0.5, AST 216, ALT 210, alk phos  250, total bilirubin 0.8.  EKG showed A-fib with RVR.  CT chest and abdomen showed multiple airspace opacities bilateral consistent with multifocal pneumonia.  Started on diltiazem drip, ceftriaxone, azithromycin. February 2024: AST 17, ALT 24, alk phos 155. Elevated LFTs in 06/2022 last year.     Dysphagia History:   02/18/2022 FEES: "Patient presents with mild-moderate pharyngeal and pharyngoesophageal dysphagia, likely multiphase dysphagia. Penetration/aspiration scores slightly worsened since she completed MBS in 2020. She is acutely post-op from paraesophageal hernia repair and heller myotomy, appearing deconditioned and requiring oxygen via nasal cannula. Pharyngeal phase is characterized by decreased laryngeal closure resulting in stagnant penetration of thin liquid and puree, and suspected decreased UES distension (from CP bar and post-cricoid impression) resulting in mild post-swallow residuals with thin liquid and puree. Penetration was not cleared with completion of the swallow, but did clear with post-swallow throat clearing (both independently initiated and cued by SLP). No aspiration observed. One instance of mild regurgitation into the hypopharynx observed that was cleared with immediate, reflexive swallowing. No post-prandial penetration or aspiration observed. Patient refused pill capsule trial. Based on this evaluation, recommend Full Liquid diet (max diet allowed per Bariatric Surgery) with strict aspiration precautions, medications crushed in puree or liquid form. Recommend swallow therapy to address pharyngeal swallow deficits."   03/31/2022 MBS: "Patient presents with moderate-severe oropharyngeal dysphagia with significant hx of esophageal deficits. Patient's extensive GI history and esophageal deficits impacted bolus efficiency and ability to trial additional consistencies. Additionally, patient demonstrated anxiety regarding today's exam and benefited from positive encouragement to  participate with study. The oral phase was characterized by delayed lingual transit and brief oral holding, most likely due to patient's anxiety regarding swallowing. This  resulted in a prolonged oral phase. Pharyngeal initiation is timely. Pharyngeal phase is characterized by incomplete laryngeal vestibule closure, reduced epiglottic inversion, reduced base of tongue retraction, diminished pharyngeal stripping wave, and reduced UES opening. Incomplete laryngeal vestibule closure resulted in stagnant penetration during trials of thin, nectar, and puree trials. Patient intermittently demonstrated a reflexive throat clear/cough and was able to partially clear penetration from vestibule. Reduced epiglottic inversion in combination with reduced base of tongue retraction and diminished pharyngeal stripping wave resulted in mild to moderate residuals in the valleculae and at the base of tongue. Patient's known esophageal deficits in combination with reduced UES opening resulted in moderate to severe residuals in the pyriforms and UES area. Residuals increased with increased viscosity and larger bolus sizes (when able to elicit). Patient unable to clear these residuals and often demonstrated regurgitation into pharyngeal and oral cavity. Patient self suctioned oral cavity with Yankeur when regurgitation occurred. Large sips were trialed, however ineffective in clearing residuals and patient often self-limited bolus amount. Honey thick liquids, solids, and pill trials were deferred during study d/t patient's deficits. Swallowing safety is moderately impaired. Swallow efficiency is severely impacted by pharyngoesophageal deficits. Recommend to continue no oral diet with alternative means of nutrition, hydration, and medication. Patient able to consume ice chips and sips of water in moderation and after good and thorough oral care. Consider a dietician consult to ensure adequate nutrition met. Will continue to monitor and  will discuss with medical team plan of care regarding additional swallow evaluations. "  03/27/22 EGD: "hx of hx of CAD (s/p PCI 2016), Afib w/ hx cardioversion in Eliquis, OSA, DM, hx CCY (2006), and laparoscopic Heller myotomy with paraesophageal hernia repair and gastropexy (02/17/22), knee surgery, posterior spinal fusion, who presented with persistent dysphagia and findings of an esophageal fluid collection.   Impression  1. Severe post operative esophageal narrowing at GEJ dilated with 10-56mm CRE dilation 2. Food in the stomach obstructed views of cardia/stomach Recommendation 1. Patient will need Dobhoff tube replacement (pulled out in recovery) 2. Management of post-op esophageal narrowing per MIS team"  05/27/22 Dr. Hillery Jacks (General Surgery): "presents for abdominal pain. She states she is not taking anything by mouth but wants to try ice chips. She was recently taken to the ED for a clogged G-tube which was successfully unclogged. She would like to see if she can swallow now.Marland KitchenMarland KitchenVerneice Rexroad Demars is a 82 y.o. female who presents for follow up. She would like to see if she could swallow and if any of her symptoms have resolved. We will have her meet with the ENT team for a FEES to determine if is safe to resume swallowing anything...40 minute discussion of issues related to pharyngeal and esophageal phase swallowing disorder. She has gained weight and looks physically well in comparison to post op and inpatient. It is reasonable to test her pharyngeal function, she is handling her secretions. It is unclear if her stasis/esophageal achalasia picture will allow her to eat, but the pharyngeal assessment would need to come first. I saw and evaluated the patient and reviewed the resident's note. I agree with the resident's findings and plan. "  Dysphagia: Patient reports swallowing saliva, ice chips, and some water. Reports she will cough "sometimes". Endorses "drowning in yellowish mucus"  at night and sleeps "practically sitting up". Patient's daughter expressed uncertainty that this was regurgitated tube feed formula. Patient reports she will regurgitate if she is not straight up (e.g. sitting in bath and needing to lean  over for assistant to wash back). Denies odynophagia. Endorses heartburn/acid reflux that she takes medication for with no relief though previously experienced relief with Protonix (does not currently take). Expressed desire to see dietician (hasn't seen one) and is unsure if one is at her facility. Patient is a resident at [facility].   EGD 2018 for dysphagia: very tortuous esophagus, large hiatal hernia,  large inflammatory polyp in the stomach biopsied (path hyperplastic polyp)     Esophagram 10/2021: Absent/weak and incomplete primary wave. Extensive  tertiary contractions predominantly in the distal esophagus. This pattern  can be seen with diffuse esophageal spasm and with type 3 achalasia.  Mild  delay in barium tablet passage through the distal esophagus, likely  related to dysmotility. No evidence of significant mass or stricture.     EGD 02/12/22: Large hernia that is creating esophagogastric junction  outflow obstruction.  Endoflip w/ low DI.  Gastric biopsies w/ mild  chronic inactive gastritis.  200 u Botox injected     Manometry 02/12/22: Findings of EGJOO, achalasia cannot be excluded     02/17/22: laparoscopic Heller myotomy with paraesophageal hernia repair and  gastropexy, discharged 02/27/22.  Did better for a week but w/  progressive/persistent dysphagia, inability to tolerate PO     Presented to [place] 03/17/22 w/ dysphagia.       CT chest/abd/pelvis 03/17/22:  1. Narrowing of the distal  esophagus/gastroesophageal junction, with contrast medium in the mildly  dilated esophagus leading up to this point. Along the distal esophageal  margin on the left side there is a 120 cc rounded complex collection at  the site of the prior  hiatal hernia along the left lower chest, which  could be from local hematoma, abscess, or less likely some sort of  herniated dilated fundoplication wrap or diverticulum. This does not seem  to fill with the adjacent dense contrast from the esophagus and there is  no internal gas within this collection, does not appear in continuity with  the esophagus lumen      Esophagram 03/18/22: There is a 1.8 cm segment of narrowing of the distal  esophagus measuring 3 to 4 mm in diameter at its thinnest. This could be  related to extrinsic compression by the previous noted mediastinal  collection.  The distal esophagus proximal to the aforementioned narrowing  is patulous with retained food debris.      EGD 03/2022: -Severe postoperative esophageal narrowing and GEJ dilated with 10 to 12 mm CRE dilation -Food in the stomach obstructive views of the cardia/stomach  Colonoscopy March 2022: -Sigmoid colon diverticulosis -Multiple colon polyps, tubular adenomas -Random biopsies to assess for microscopic colitis, pathology negative  Prior to Admission medications   Medication Sig Start Date End Date Taking? Authorizing Provider  acetaminophen (TYLENOL) 325 MG tablet Take 650 mg by mouth 3 (three) times daily.   Yes [provider]  aspirin EC 81 MG tablet Take 81 mg by mouth daily. Swallow whole.   Yes [provider]  busPIRone (BUSPAR) 10 MG tablet Take 10 mg by mouth 3 (three) times daily.   Yes [provider]  clobetasol (TEMOVATE) 0.05 % external solution Apply 1 application. topically 2 (two) times a week. Monday and Friday   Yes [provider]  diclofenac Sodium (VOLTAREN) 1 % GEL Apply 2 g topically 3 (three) times daily. To neck, bilateral shoulders, and right hand (thumb area).   Yes [provider]  diltiazem (CARDIZEM) 120 MG tablet Take  120 mg by mouth 3 (three) times daily.   Yes [provider]  ELIQUIS 5 MG TABS tablet Take 1  tablet (5 mg total) by mouth 2 (two) times daily. 11/26/21  Yes Gottschalk, Ashly M, DO  eszopiclone (LUNESTA) 2 MG TABS tablet Take 1 tablet (2 mg total) by mouth at bedtime. 05/24/23  Yes Sharee Holster, NP  fentaNYL (DURAGESIC) 25 MCG/HR Place 1 patch onto the skin every 3 (three) days. 05/24/23  Yes Sharee Holster, NP  FLUoxetine (PROZAC) 20 MG capsule Take 20 mg by mouth daily. Give with 40 mg to equal 60   Yes [provider]  FLUoxetine (PROZAC) 40 MG capsule Take 40 mg by mouth daily. Give with 20 mg to equal 60   Yes [provider]  furosemide (LASIX) 40 MG tablet Take 40 mg by mouth daily.   Yes [provider]  gabapentin (NEURONTIN) 300 MG capsule Take 300 mg by mouth 3 (three) times daily.   Yes [provider]  lidocaine 4 % Place 1 patch onto the skin in the morning and at bedtime.   Yes [provider]  loratadine (CLARITIN) 10 MG tablet Take 10 mg by mouth as needed for itching.   Yes [provider]  methocarbamol (ROBAXIN) 500 MG tablet Take 500 mg by mouth every 6 (six) hours as needed for muscle spasms.   Yes [provider]  miconazole (MICOTIN) 2 % powder Apply 1 Application topically as directed. Apply a small amount to affected area every shift change as needed.   Yes [provider]  nitroGLYCERIN (NITROSTAT) 0.4 MG SL tablet Place 1 tablet (0.4 mg total) under the tongue every 5 (five) minutes as needed for chest pain (up to 3 doses). 11/17/21  Yes Dyann Kief, PA-C  omeprazole (PRILOSEC) 20 MG capsule Take 20 mg by mouth 2 (two) times daily before a meal.   Yes [provider]  ondansetron (ZOFRAN-ODT) 4 MG disintegrating tablet Take 4 mg by mouth every 6 (six) hours as needed for nausea or vomiting.   Yes [provider]  potassium chloride (MICRO-K) 10 MEQ CR capsule Take 20 mEq by mouth daily.   Yes [provider]  rOPINIRole (REQUIP) 0.5 MG tablet Take 0.5 mg by  mouth in the morning and at bedtime.   Yes [provider]  Simethicone Extra Strength 125 MG CAPS Take 1 capsule by mouth every 6 (six) hours as needed (Gas).   Yes [provider]  traMADol (ULTRAM) 50 MG tablet Take 0.5 tablets (25 mg total) by mouth every 8 (eight) hours. 05/24/23  Yes Sharee Holster, NP  DOXYCYCLINE HYCLATE PO Take 100 mg by mouth in the morning and at bedtime. Patient not taking: Reported on 06/18/2023    [provider]    Current Facility-Administered Medications  Medication Dose Route Frequency Provider Last Rate Last Admin   azithromycin (ZITHROMAX) 500 mg in sodium chloride 0.9 % 250 mL IVPB  500 mg Intravenous Once Bethann Berkshire, MD       diltiazem (CARDIZEM) 125 mg in dextrose 5% 125 mL (1 mg/mL) infusion  5 mg/hr Intravenous Continuous Bethann Berkshire, MD 5 mL/hr at 06/18/23 1246 5 mg/hr at 06/18/23 1246   traMADol (ULTRAM) tablet 50 mg  50 mg Oral Once Bethann Berkshire, MD       Current Outpatient Medications  Medication Sig Dispense Refill   acetaminophen (TYLENOL) 325 MG tablet Take 650 mg by mouth 3 (three)  times daily.     aspirin EC 81 MG tablet Take 81 mg by mouth daily. Swallow whole.     busPIRone (BUSPAR) 10 MG tablet Take 10 mg by mouth 3 (three) times daily.     clobetasol (TEMOVATE) 0.05 % external solution Apply 1 application. topically 2 (two) times a week. Monday and Friday     diclofenac Sodium (VOLTAREN) 1 % GEL Apply 2 g topically 3 (three) times daily. To neck, bilateral shoulders, and right hand (thumb area).     diltiazem (CARDIZEM) 120 MG tablet Take 120 mg by mouth 3 (three) times daily.     ELIQUIS 5 MG TABS tablet Take 1 tablet (5 mg total) by mouth 2 (two) times daily. 180 tablet 3   eszopiclone (LUNESTA) 2 MG TABS tablet Take 1 tablet (2 mg total) by mouth at bedtime. 30 tablet 0   fentaNYL (DURAGESIC) 25 MCG/HR Place 1 patch onto the skin every 3 (three) days. 10 patch 0   FLUoxetine (PROZAC) 20 MG capsule  Take 20 mg by mouth daily. Give with 40 mg to equal 60     FLUoxetine (PROZAC) 40 MG capsule Take 40 mg by mouth daily. Give with 20 mg to equal 60     furosemide (LASIX) 40 MG tablet Take 40 mg by mouth daily.     gabapentin (NEURONTIN) 300 MG capsule Take 300 mg by mouth 3 (three) times daily.     lidocaine 4 % Place 1 patch onto the skin in the morning and at bedtime.     loratadine (CLARITIN) 10 MG tablet Take 10 mg by mouth as needed for itching.     methocarbamol (ROBAXIN) 500 MG tablet Take 500 mg by mouth every 6 (six) hours as needed for muscle spasms.     miconazole (MICOTIN) 2 % powder Apply 1 Application topically as directed. Apply a small amount to affected area every shift change as needed.     nitroGLYCERIN (NITROSTAT) 0.4 MG SL tablet Place 1 tablet (0.4 mg total) under the tongue every 5 (five) minutes as needed for chest pain (up to 3 doses). 25 tablet 3   omeprazole (PRILOSEC) 20 MG capsule Take 20 mg by mouth 2 (two) times daily before a meal.     ondansetron (ZOFRAN-ODT) 4 MG disintegrating tablet Take 4 mg by mouth every 6 (six) hours as needed for nausea or vomiting.     potassium chloride (MICRO-K) 10 MEQ CR capsule Take 20 mEq by mouth daily.     rOPINIRole (REQUIP) 0.5 MG tablet Take 0.5 mg by mouth in the morning and at bedtime.     Simethicone Extra Strength 125 MG CAPS Take 1 capsule by mouth every 6 (six) hours as needed (Gas).     traMADol (ULTRAM) 50 MG tablet Take 0.5 tablets (25 mg total) by mouth every 8 (eight) hours. 45 tablet 0   DOXYCYCLINE HYCLATE PO Take 100 mg by mouth in the morning and at bedtime. (Patient not taking: Reported on 06/18/2023)      Allergies as of 06/18/2023 - Review Complete 06/18/2023  Allergen Reaction Noted   Buprenorphine hcl Itching 05/21/2012   Cymbalta [duloxetine hcl] Other (See Comments) 12/26/2012   Morphine and codeine Itching 05/21/2012   Oxycodone-acetaminophen Other (See Comments) 05/22/2008   Valium [diazepam] Other  (See Comments) 11/08/2014   Statins Other (See Comments) 11/23/2016   Morphine Itching 01/31/2014   Altace [ramipril] Other (See Comments) 04/13/2013   Floxin [ofloxacin] Other (See Comments) 04/13/2013  Hydromorphone Other (See Comments) 09/15/2014   Lovaza [omega-3-acid ethyl esters] Other (See Comments) 04/13/2013   Trilipix [choline fenofibrate] Other (See Comments) 04/13/2013   Zetia [ezetimibe] Other (See Comments) 04/13/2013    Past Medical History:  Diagnosis Date   Abnormal nuclear cardiac imaging test    Anemia, iron deficiency 07/02/2015   Anxiety    Arthritis    "knees, back, fingers, toes; joints" (01/07/2015)   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 difficile colitis 07/21/2022   CAD (coronary artery disease)    a. Abnl nuc 11/2014. Cath 12/2014 - turned down for CABG. Ultimately s/p TTVP, rotational atherectomy, PTCA and stenting of the ostial LCx and left main into the LAD (crush technique), and IVUS of the LAD/Left main. // b. Myoview 11/17: EF 48, poor quality/significant artifact; inf-lateral, inferior ischemia; Intermediate Risk   Chronic atrial fibrillation (HCC)    a.  First noted post-op 9/15 spinal fusion.  She had cardioversion, not on anticoagulation. Fall risk, unsteady. // failed DCCV // Holter 10/17: AFib, Avg HR 97, PVCs, no other arrhythmia   Chronic back pain greater than 3 months duration    a. spinal stenosis.  Spinal fusion with rods in 2/15 at [facility] spinal fusion 9/15.   Chronic diastolic CHF    Echo 2/17:  EF 50-55%, trivial AI, midl MR, mod LAE, PASP 37 mmHg // Echocardiogram 03/2020: EF 45-50, no RWMA, mild LVH, mild reduced RVSF, mildly elevated PASP (RVSP 38.3), mild LAE, mild MR, mild to mod TR, trivial AI // Echo 9/21: EF 45, mild LVH, mildly reduced RV SF, moderate LAE, mild RAE, mild MR, mild AI      Circadian rhythm sleep disorder    CTS (carpal tunnel syndrome)     Deficiency anemia 11/10/2014   Depression    Diarrhea    Diverticulitis of colon    Esophageal stricture    Gastritis    Gastroesophageal hernia 07/06/2013   GERD (gastroesophageal reflux disease)    Hiatal hernia    History of blood transfusion    "most of them related to OR's"    HTN (hypertension)    Hyperlipidemia    IBS (irritable bowel syndrome)    Incontinence 10/13/2012   Insomnia    Ischemic chest pain (HCC)    Memory disorder 12/04/2014   Metabolic syndrome 04/24/2014   MGUS (monoclonal gammopathy of unknown significance) dx'd 11/2014   a. Neg BMB 11/2014.   Multiple falls    Obesity    Obstructive sleep apnea    "have mask; don't wear it" (01/07/2015)   Orthostasis    PAT (paroxysmal atrial tachycardia)    Personal history of colonic polyps 10/25/2011 & 12/02/11   not retrieved Dr Victorino Dike & tubular adenomas   Pneumonia 03/2014   Sinus bradycardia    a. Baseline HR 50s-60s.   Stroke [facility]) early 2000's   "small"; denies residual on 01/07/2015)    Past Surgical History:  Procedure Laterality Date   BACK SURGERY     BONE MARROW BIOPSY  11/2014   CARDIAC CATHETERIZATION  12/27/2014   Procedure: INTRAVASCULAR PRESSURE WIRE/FFR STUDY;  Surgeon: Laurey Morale, MD;  Location: [facility] CATH LAB;  Service: Cardiovascular;;   CARDIAC CATHETERIZATION N/A 11/25/2016   Procedure: Left Heart Cath and Coronary Angiography;  Surgeon: Tonny Bollman, MD;  Location: [facility] INVASIVE CV LAB;  Service: Cardiovascular;  Laterality: N/A;   CARDIOVERSION N/A 02/13/2016   Procedure:  CARDIOVERSION;  Surgeon: Wendall Stade, MD;  Location: [facility] ENDOSCOPY;  Service: Cardiovascular;  Laterality: N/A;   CARPAL TUNNEL RELEASE Right 1980's   CATARACT EXTRACTION Bilateral    CATARACT EXTRACTION W/ INTRAOCULAR LENS  IMPLANT, BILATERAL  2000's   CORONARY ANGIOPLASTY WITH STENT PLACEMENT  01/07/2015   "2"   CORONARY STENT PLACEMENT     DILATION AND CURETTAGE OF UTERUS     ESOPHAGOGASTRODUODENOSCOPY  (EGD) WITH ESOPHAGEAL DILATION  "several times"   IR GASTROSTOMY TUBE REMOVAL  12/23/2022   IR REPLC GASTRO/COLONIC TUBE PERCUT W/FLUORO  09/08/2022   JOINT REPLACEMENT     KNEE ARTHROSCOPY Left 1995   LAPAROSCOPIC CHOLECYSTECTOMY  2003   LEFT HEART CATHETERIZATION WITH CORONARY ANGIOGRAM N/A 12/27/2014   Procedure: LEFT HEART CATHETERIZATION WITH CORONARY ANGIOGRAM;  Surgeon: Laurey Morale, MD;  Location: [facility] CATH LAB;  Service: Cardiovascular;  Laterality: N/A;   PERCUTANEOUS CORONARY ROTOBLATOR INTERVENTION (PCI-R)  01/07/2015   PERCUTANEOUS CORONARY ROTOBLATOR INTERVENTION (PCI-R) N/A 01/07/2015   Procedure: PERCUTANEOUS CORONARY ROTOBLATOR INTERVENTION (PCI-R);  Surgeon: Micheline Chapman, MD;  Location: [facility] CATH LAB;  Service: Cardiovascular;  Laterality: N/A;   POSTERIOR FUSION THORACIC SPINE  08/2015   POSTERIOR LUMBAR FUSION  01/2015   TOTAL KNEE ARTHROPLASTY Bilateral 1990's - 2000's    Family History  Problem Relation Age of Onset   Coronary artery disease Father    Peripheral vascular disease Father    Coronary artery disease Mother    Coronary artery disease Brother    Colon cancer Sister    Emphysema Sister    Sleep apnea Son    Colon cancer Sister        spread to her brain   Emphysema Brother    Dementia Neg Hx     Social History   Socioeconomic History   Marital status: Widowed    Spouse name: Not on file   Number of children: 2   Years of education: HS   Highest education level: Not on file  Occupational History   Occupation: Comptroller- retired    Comment: retired   Occupation: retired  Tobacco Use   Smoking status: Never   Smokeless tobacco: Never  Vaping Use   Vaping Use: Never used  Substance and Sexual Activity   Alcohol use: No    Comment: 01/07/2015 "glass of wine at Christmas, maybe"   Drug use: No   Sexual activity: Not Currently  Other Topics Concern   Not on file  Social History Narrative   Patient is right handed   Long term resident of [facility]     Social Determinants of Health   Financial Resource Strain: Not on file  Food Insecurity: Not on file  Transportation Needs: Not on file  Physical Activity: Inactive (04/04/2020)   Exercise Vital Sign    Days of Exercise per Week: 0 days    Minutes of Exercise per Session: 0 min  Stress: Stress Concern Present (04/04/2020)   [employer] of Occupational Health - Occupational Stress Questionnaire    Feeling of Stress : To some extent  Social Connections: Socially Isolated (04/04/2020)   Social Connection and Isolation Panel [NHANES]    Frequency of Communication with Friends and Family: Three times a week    Frequency of Social Gatherings with Friends and Family: Once a week    Attends Religious Services: Never    Database administrator or Organizations: No    Attends Banker Meetings: Never    Marital Status:  Widowed  Intimate Partner Violence: Not on file     Review of System:   General: Negative for anorexia, weight loss, fever, chills, fatigue, weakness. Eyes: Negative for vision changes.  ENT: Negative for hoarseness, difficulty swallowing , nasal congestion. CV: Negative for chest pain, angina, palpitations, dyspnea on exertion, peripheral edema.  Respiratory: Negative for dyspnea at rest, dyspnea on exertion, cough, sputum, wheezing.  GI: See history of present illness. GU:  Negative for dysuria, hematuria, urinary incontinence, urinary frequency, nocturnal urination.  MS: Negative for joint pain, low back pain.  Derm: Negative for rash or itching.  Neuro: Negative for weakness, abnormal sensation, seizure, frequent headaches, memory loss, confusion.  Psych: Negative for anxiety, depression, suicidal ideation, hallucinations.  Endo: Negative for unusual weight change.  Heme: Negative for bruising or bleeding. Allergy: Negative for rash or hives.      Physical Examination:   Vital signs in last 24 hours: Temp:  [98.1 F (36.7 C)-99.1 F (37.3 C)]  99.1 F (37.3 C) (06/28 1538) Pulse Rate:  [108-132] 122 (06/28 1445) Resp:  [13-27] 18 (06/28 1445) BP: (141-162)/(78-119) 162/78 (06/28 1445) SpO2:  [87 %-92 %] 90 % (06/28 1445)    General: Well-nourished, well-developed in no acute distress.  Head: Normocephalic, atraumatic.   Eyes: Conjunctiva pink, no icterus. Mouth: Oropharyngeal mucosa moist and pink , no lesions erythema or exudate. Neck: Supple without thyromegaly, masses, or lymphadenopathy.  Lungs: Clear to auscultation bilaterally.  Heart: Regular rate and rhythm, no murmurs rubs or gallops.  Abdomen: Bowel sounds are normal, nontender, nondistended, no hepatosplenomegaly or masses, no abdominal bruits or hernia , no rebound or guarding.   Rectal: not performed Extremities: No lower extremity edema, clubbing, deformity.  Neuro: Alert and oriented x 4 , grossly normal neurologically.  Skin: Warm and dry, no rash or jaundice.   Psych: Alert and cooperative, normal mood and affect.        Intake/Output from previous day: No intake/output data recorded. Intake/Output this shift: Total I/O In: 2780 [IV Piggyback:2780] Out: -   Lab Results:   CBC Recent Labs    06/18/23 1144  WBC 6.5  HGB 11.6*  HCT 36.3  MCV 87.5  PLT 171   BMET Recent Labs    06/18/23 1144  NA 131*  K 3.7  CL 97*  CO2 24  GLUCOSE 130*  BUN 10  CREATININE 0.50  CALCIUM 8.3*   LFT Recent Labs    06/18/23 1144  BILITOT 0.8  ALKPHOS 250*  AST 216*  ALT 210*  PROT 6.9  ALBUMIN 3.4*    Lipase No results for input(s): "LIPASE" in the last 72 hours.  PT/INR No results for input(s): "LABPROT", "INR" in the last 72 hours.   Hepatitis Panel No results for input(s): "HEPBSAG", "HCVAB", "HEPAIGM", "HEPBIGM" in the last 72 hours.   Imaging Studies:   CT CHEST ABDOMEN PELVIS WO CONTRAST  Result Date: 06/18/2023 CLINICAL DATA:  Dysphagia, acute generalized abdominal pain. EXAM: CT CHEST, ABDOMEN AND PELVIS WITHOUT CONTRAST  TECHNIQUE: Multidetector CT imaging of the chest, abdomen and pelvis was performed following the standard protocol without IV contrast. RADIATION DOSE REDUCTION: This exam was performed according to the departmental dose-optimization program which includes automated exposure control, adjustment of the mA and/or kV according to patient size and/or use of iterative reconstruction technique. COMPARISON:  March 17, 2022. FINDINGS: CT CHEST FINDINGS Cardiovascular: Atherosclerosis of thoracic aorta is noted without aneurysm formation. Mild cardiomegaly. No pericardial effusion. Coronary artery calcifications are noted. Mediastinum/Nodes:  No enlarged mediastinal, hilar, or axillary lymph nodes. Thyroid gland, trachea, and esophagus demonstrate no significant findings. Lungs/Pleura: No pneumothorax or pleural effusion is noted. Multiple patchy airspace opacities are noted bilaterally consistent with multifocal pneumonia. Mild left posterior basilar subsegmental atelectasis is noted. Musculoskeletal: Surgical posterior fusion is seen involving the thoracic and lumbar spine. No acute osseous abnormality is noted. CT ABDOMEN PELVIS FINDINGS Hepatobiliary: No focal liver abnormality is seen. Status post cholecystectomy. No biliary dilatation. Pancreas: Pancreatic atrophy is again noted. No acute abnormality seen. Spleen: Normal in size without focal abnormality. Adrenals/Urinary Tract: Adrenal glands appear normal. Mild right renal atrophy is noted. No renal or ureteral calculi are noted. No definite hydronephrosis or renal obstruction is noted. Urinary bladder is unremarkable. Stomach/Bowel: Stomach is within normal limits. Appendix appears normal. No evidence of bowel wall thickening, distention, or inflammatory changes. Vascular/Lymphatic: Aortic atherosclerosis. No enlarged abdominal or pelvic lymph nodes. Reproductive: Uterus and bilateral adnexa are unremarkable. Other: Small fat containing periumbilical hernia. No  ascites is noted. Musculoskeletal: Status post surgical posterior fusion of lumbar spine as well. No acute osseous abnormality is noted. IMPRESSION: Multiple airspace opacities are noted bilaterally consistent with multifocal pneumonia. Mild left posterior basilar subsegmental atelectasis is noted. Mild right renal atrophy is again noted. Pancreatic atrophy is again noted. Small fat containing periumbilical hernia. Status post surgical posterior fusion involving thoracic and lumbar spine. Aortic Atherosclerosis (ICD10-I70.0). Electronically Signed   By: Lupita Raider M.D.   On: 06/18/2023 12:57   DG Chest Port 1 View  Result Date: 06/18/2023 CLINICAL DATA:  82 year old female with shortness of breath, abdominal pain and difficulty swallowing for 1 week. EXAM: PORTABLE CHEST 1 VIEW COMPARISON:  Chest radiographs 02/23/2023 and earlier. FINDINGS: Portable AP upright view at 1220 hours. Stable lung volumes and mediastinal contours. Chronic eventration of the right hemidiaphragm is stable. Improved left lung base ventilation since March. But there remains blunting of the left lateral costophrenic angle, and streaky retrocardiac opacity. No superimposed pneumothorax or pulmonary edema. No other confluent opacity. Extensive posterior spinal fusion hardware redemonstrated, grossly stable. Stable cholecystectomy clips. Paucity of bowel gas in the visible upper abdomen. IMPRESSION: Improved left lung base ventilation since March, but evidence of a small left pleural effusion and residual streaky, nonspecific retrocardiac opacity. Electronically Signed   By: Odessa Fleming M.D.   On: 06/18/2023 12:29  [4 week]  Assessment:  82 yo female with chronic back pain, chronic lower extremity edema, CAD, permanent A-fib on Eliquis, chronic HFmrEF, hypertension, Cdiff, Stroke, MGUS, hyperlipidemia, restless leg syndrome, esophageal stricture, paraesophageal hernia status post postsurgical repair, depression/anxiety presenting with  increasing difficulty swallowing over the past month, worse over the past 2 days.  Complaining of lower abdominal pain. Noted to have multifocal PNA suspected aspiration PNA, elevated LFTs. GI consulted for dysphagia.   Dysphagia: Complex history as outlined above.  Initially was felt that her dysphagia may be secondary to large paraesophageal hernia but achalasia cannot be excluded.  In February 2023 she underwent paraesophageal hernia repair, Heller myotomy, gastropexy at [facility].  She did okay about a week after her surgery but had progressive recurrent dysphagia.  EGD April 2023 confirmed Severe postoperative esophageal narrowing and GEJ dilated with 10 to 12 mm CRE dilation.  Patient ultimately required feeding tube.  Towards the end of last year she started trying to take more orally.  Modified barium swallow study completed October 2023 showed minimal/moderate pharyngeal phase dysphagia and suspected primary esophageal phase dysphagia, esophageal  sweep revealed barium filled esophagus in the distal two thirds of the esophagus with some retrograde movement.  Regular textures versus mechanical soft foods and thin liquids via cup/straw allowed.  Recommending to sit upright 30 minutes after eating.  Eventually patient made decision to have her feeding tube removed January 2024.  Patient with reported recurrent aspiration pneumonia as outlined above.  Now with inability to take oral medications over past several days.  Suspicious of ineffective or absent esophageal peristalsis and/or esophageal stricture.  Given her multiple comorbidities, it is not clear whether she would be a candidate for any type of anesthesia at our facility.  I have discussed this with both the patient and her daughter today.  For now treat pneumonia.  Likely will need a upper endoscopy, barium esophagram I suspect would be abnormal and cannot provide any therapeutic value.  At this time patient is not sure  that she wants to pursue insertion of feeding tube, she is leaning heavily against right now.  Abnormal LFTs: Intermittent nature.  She is having abdominal pain left lower quadrant, denies any right upper quadrant pain.  8 mm common bile duct felt to be normal due to postcholecystectomy state/patient's age.  Mild intrahepatic biliary duct ectasia.  Query drug effect or related to acute illness.  Plan:   Trend LFTs. Supportive measures for now with adequate hydration.  EGD will likely be needed once she is more stable from a cardiopulmonary standpoint.  We will have to determine if she can undergo anesthesia at our facility given her multiple comorbidities.   LOS: 0 days   We would like to thank you for the opportunity to participate in the care of Nissa W Saintvil.  Leanna Battles. Dixon Boos [facility] [id number] 6/28/20244:03 PM  Attending note: This lady has a history of complicated upper GI tract disease.  Now with symptoms of recurrent dysphagia and aspiration pneumonia.  Workup previously suspicious for underlying esophageal motility disorder (esophageal outflow obstruction versus achalasia).  Need to also consider the possibility of failure of surgical intervention last year (no obvious explanation on today's CT scans).  Needs imaging of her upper GI tract.  In this clinical setting, would lean towards a barium esophagram/upper GI series prior to consideration of endoscopic evaluation pending improvement in cardiopulmonary status.  It may be that this patient's condition is beyond the scope of anesthesia at our facility.  For now, supportive measures including antibiotics.  Agree with holding Eliquis.  Esophagram/upper GI series the first of next week as feasible.  Further recommendations to follow.

## 2023-06-18 NOTE — H&P (Signed)
History and Physical    Patient: [name] [name] JXB:[id number] DOB: [birth year] DOA: 06/18/2023 DOS: the patient was seen and examined on 06/18/2023 PCP: Sharee Holster, NP  Patient coming from: Home  Chief Complaint:  Chief Complaint  Patient presents with   Abdominal Pain   HPI: [name] [name] is a 82 year old female with a history of chronic back pain status post fusion, chronic lower extremity edema, coronary artery disease, HFmrEF (50-55%), permanent atrial fibrillation, hypertension, hyperlipidemia, restless leg syndrome, esophageal stricture, paraesophageal hernia status postsurgical repair, depression/anxiety presenting with increasing difficulty swallowing over the past month, worsening over the past 2 days.  He was also been complaining of lower abdominal pain.  Notably, the patient had a paraesophageal hernia repair, Heller myotomy, and gastropexy at [facility] in March 2023.  She had a gastrostomy tube thereafter requiring enteral feedings for period of time.  This was subsequently removed in December 2023, and the patient has been eating by mouth since that period of time.  However over the next several months, she has noted increasing dysphagia type symptoms.  This has been worsening despite crushing her pills and eating softer food.  She has had to basically tolerate what amounts to a full liquid and soft diet.  However in the past few days prior to admission she has had increasing regurgitation, hypersalivation, and dry heaving.  She has had difficulty tolerating her pills.  She has had increasing shortness of breath over the past few days.  She denies any frank chest pain, fevers, chills, diarrhea, hematochezia, melena.  She has had some urinary frequency but denies any frank dysuria.  There is no hematemesis. Because she has had increasing difficulty tolerating pills she has not been able to take her cardiac medications or pain medications. In the ED, the patient was afebrile, but  tachycardic in the 140s.  She was hemodynamically stable.  Oxygen saturation was 87% on 2 L.  She was placed on nasal cannula.  WBC 6.5, hemoglobin 9.6, platelets 171.  Sodium 131, potassium 3.7, bicarbonate 24, serum creatinine 0.50.  AST 216, ALT 210, alk phos 50-50, total bili 0.8.  EKG showed atrial fibrillation with RVR.  There is no ST-T wave changes.  CT chest and abdomen showed multiple airspace opacities bilaterally consistent with multifocal pneumonia.  There is moderate renal atrophy.  There is pancreatic atrophy.  There is a periumbilical hernia.  Stomach was within normal limits.  There is no bowel obstruction.  She was started on diltiazem drip, ceftriaxone, azithromycin.  Review of Systems: As mentioned in the history of present illness. All other systems reviewed and are negative. 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 difficile colitis 07/21/2022   CAD (coronary artery disease)    a. Abnl nuc 11/2014. Cath 12/2014 - turned down for CABG. Ultimately s/p TTVP, rotational atherectomy, PTCA and stenting of the ostial LCx and left main into the LAD (crush technique), and IVUS of the LAD/Left main. // b. Myoview 11/17: EF 48, poor quality/significant artifact; inf-lateral, inferior ischemia; Intermediate Risk   Chronic atrial fibrillation (HCC)    a.  First noted post-op 9/15 spinal fusion.  She had cardioversion, not on anticoagulation. Fall risk, unsteady. // failed DCCV //  Holter 10/17: AFib, Avg HR 97, PVCs, no other arrhythmia   Chronic back pain greater than 3 months duration    a. spinal stenosis.  Spinal fusion with rods in 2/15 at [facility] spinal fusion 9/15.   Chronic diastolic CHF    Echo 2/17:  EF 50-55%,  trivial AI, midl MR, mod LAE, PASP 37 mmHg // Echocardiogram 03/2020: EF 45-50, no RWMA, mild LVH, mild reduced RVSF, mildly elevated PASP (RVSP 38.3), mild LAE, mild MR, mild to mod TR, trivial AI // Echo 9/21: EF 45, mild LVH, mildly reduced RV SF, moderate LAE, mild RAE, mild MR, mild AI      Circadian rhythm sleep disorder    CTS (carpal tunnel syndrome)    Deficiency anemia 11/10/2014   Depression    Diarrhea    Diverticulitis of colon    Esophageal stricture    Gastritis    Gastroesophageal hernia 07/06/2013   GERD (gastroesophageal reflux disease)    Hiatal hernia    History of blood transfusion    "most of them related to OR's"    HTN (hypertension)    Hyperlipidemia    IBS (irritable bowel syndrome)    Incontinence 10/13/2012   Insomnia    Ischemic chest pain (HCC)    Memory disorder 12/04/2014   Metabolic syndrome 04/24/2014   MGUS (monoclonal gammopathy of unknown significance) dx'd 11/2014   a. Neg BMB 11/2014.   Multiple falls    Obesity    Obstructive sleep apnea    "have mask; don't wear it" (01/07/2015)   Orthostasis    PAT (paroxysmal atrial tachycardia)    Personal history of colonic polyps 10/25/2011 & 12/02/11   not retrieved Dr Victorino Dike & tubular adenomas   Pneumonia 03/2014   Sinus bradycardia    a. Baseline HR 50s-60s.   Stroke [facility]) early 2000's   "small"; denies residual on 01/07/2015)   Past Surgical History:  Procedure Laterality Date   BACK SURGERY     BONE MARROW BIOPSY  11/2014   CARDIAC CATHETERIZATION  12/27/2014   Procedure: INTRAVASCULAR PRESSURE WIRE/FFR STUDY;  Surgeon: Laurey Morale, MD;  Location: [facility] CATH LAB;  Service: Cardiovascular;;   CARDIAC CATHETERIZATION N/A 11/25/2016   Procedure: Left Heart Cath and Coronary Angiography;  Surgeon: Tonny Bollman, MD;  Location: [facility] INVASIVE CV LAB;  Service: Cardiovascular;  Laterality: N/A;   CARDIOVERSION N/A 02/13/2016   Procedure: CARDIOVERSION;  Surgeon: Wendall Stade, MD;  Location: [facility]  ENDOSCOPY;  Service: Cardiovascular;  Laterality: N/A;   CARPAL TUNNEL RELEASE Right 1980's   CATARACT EXTRACTION Bilateral    CATARACT EXTRACTION W/ INTRAOCULAR LENS  IMPLANT, BILATERAL  2000's   CORONARY ANGIOPLASTY WITH STENT PLACEMENT  01/07/2015   "2"   CORONARY STENT PLACEMENT     DILATION AND CURETTAGE OF UTERUS     ESOPHAGOGASTRODUODENOSCOPY (EGD) WITH ESOPHAGEAL DILATION  "several times"   IR GASTROSTOMY TUBE REMOVAL  12/23/2022   IR REPLC GASTRO/COLONIC TUBE PERCUT W/FLUORO  09/08/2022   JOINT REPLACEMENT     KNEE ARTHROSCOPY Left 1995   LAPAROSCOPIC CHOLECYSTECTOMY  2003   LEFT HEART CATHETERIZATION WITH CORONARY ANGIOGRAM N/A 12/27/2014   Procedure: LEFT HEART CATHETERIZATION WITH CORONARY ANGIOGRAM;  Surgeon: Laurey Morale, MD;  Location: [facility] CATH LAB;  Service: Cardiovascular;  Laterality: N/A;   PERCUTANEOUS CORONARY ROTOBLATOR INTERVENTION (PCI-R)  01/07/2015   PERCUTANEOUS CORONARY ROTOBLATOR INTERVENTION (PCI-R) N/A 01/07/2015   Procedure: PERCUTANEOUS CORONARY ROTOBLATOR INTERVENTION (PCI-R);  Surgeon: Micheline Chapman, MD;  Location: [facility] CATH LAB;  Service: Cardiovascular;  Laterality: N/A;   POSTERIOR FUSION THORACIC SPINE  08/2015   POSTERIOR LUMBAR FUSION  01/2015   TOTAL KNEE ARTHROPLASTY Bilateral 1990's - 2000's   Social History:  reports that she has never smoked. She has never used smokeless tobacco. She reports that she does not drink alcohol and does not use drugs.  Allergies  Allergen Reactions   Buprenorphine Hcl Itching   Cymbalta [Duloxetine Hcl] Other (See Comments)    Other reaction(s): Other (See Comments) Personality changes - crying  2014   Morphine And Codeine Itching   Oxycodone-Acetaminophen Other (See Comments)    Personality changes    Valium [Diazepam] Other (See Comments)    Personality changes - "in another world" ;  Hallucinations  2015   Statins Other (See Comments)    Other reaction(s): Other (See Comments) Muscle weakness Muscle weakness    Morphine Itching   Altace [Ramipril] Other (See Comments)    unknown   Floxin [Ofloxacin] Other (See Comments)    unknown   Hydromorphone Other (See Comments)    Other reaction(s): Confusion (intolerance) unknown   Lovaza [Omega-3-Acid Ethyl Esters] Other (See Comments)    Muscle weakness    Trilipix [Choline Fenofibrate] Other (See Comments)    Muscle weakness    Zetia [Ezetimibe] Other (See Comments)    Muscle weakness     Family History  Problem Relation Age of Onset   Coronary artery disease Father    Peripheral vascular disease Father    Coronary artery disease Mother    Coronary artery disease Brother    Colon cancer Sister    Emphysema Sister    Sleep apnea Son    Colon cancer Sister        spread to her brain   Emphysema Brother    Dementia Neg Hx     Prior to Admission medications   Medication Sig Start Date End Date Taking? Authorizing Provider  acetaminophen (TYLENOL) 325 MG tablet Take 650 mg by mouth 3 (three) times daily.   Yes [provider]  aspirin EC 81 MG tablet Take 81 mg by mouth daily. Swallow whole.   Yes [provider]  busPIRone (BUSPAR) 10 MG tablet Take 10 mg by mouth 3 (three) times daily.   Yes [provider]  clobetasol (TEMOVATE) 0.05 % external solution Apply 1 application. topically 2 (two) times a week. Monday and Friday   Yes [provider]  diclofenac Sodium (VOLTAREN) 1 % GEL Apply 2 g topically 3 (three) times daily. To neck, bilateral shoulders, and right hand (thumb area).   Yes [provider]  diltiazem (CARDIZEM) 120 MG tablet Take 120 mg by mouth 3 (three) times daily.   Yes [provider]  ELIQUIS 5 MG TABS tablet Take 1 tablet (5 mg total) by mouth 2 (two) times daily. 11/26/21  Yes Gottschalk, Ashly M, DO  eszopiclone (LUNESTA) 2 MG TABS tablet Take 1 tablet (2 mg total) by mouth at bedtime. 05/24/23  Yes Sharee Holster, NP  fentaNYL (DURAGESIC) 25 MCG/HR Place 1 patch  onto the skin every 3 (three) days. 05/24/23  Yes Sharee Holster, NP  FLUoxetine (PROZAC) 20 MG capsule Take 20 mg by mouth daily. Give with 40 mg to equal 60   Yes [provider]  FLUoxetine (PROZAC) 40 MG capsule Take 40 mg by mouth daily. Give with 20 mg to equal 60   Yes [provider]  furosemide (LASIX)  40 MG tablet Take 40 mg by mouth daily.   Yes [provider]  gabapentin (NEURONTIN) 300 MG capsule Take 300 mg by mouth 3 (three) times daily.   Yes [provider]  lidocaine 4 % Place 1 patch onto the skin in the morning and at bedtime.   Yes [provider]  loratadine (CLARITIN) 10 MG tablet Take 10 mg by mouth as needed for itching.   Yes [provider]  methocarbamol (ROBAXIN) 500 MG tablet Take 500 mg by mouth every 6 (six) hours as needed for muscle spasms.   Yes [provider]  miconazole (MICOTIN) 2 % powder Apply 1 Application topically as directed. Apply a small amount to affected area every shift change as needed.   Yes [provider]  nitroGLYCERIN (NITROSTAT) 0.4 MG SL tablet Place 1 tablet (0.4 mg total) under the tongue every 5 (five) minutes as needed for chest pain (up to 3 doses). 11/17/21  Yes Dyann Kief, PA-C  omeprazole (PRILOSEC) 20 MG capsule Take 20 mg by mouth 2 (two) times daily before a meal.   Yes [provider]  ondansetron (ZOFRAN-ODT) 4 MG disintegrating tablet Take 4 mg by mouth every 6 (six) hours as needed for nausea or vomiting.   Yes [provider]  potassium chloride (MICRO-K) 10 MEQ CR capsule Take 20 mEq by mouth daily.   Yes [provider]  rOPINIRole (REQUIP) 0.5 MG tablet Take 0.5 mg by mouth in the morning and at bedtime.   Yes [provider]  Simethicone Extra Strength 125 MG CAPS Take 1 capsule by mouth every 6 (six) hours as needed (Gas).   Yes [provider]  traMADol (ULTRAM) 50 MG tablet Take 0.5 tablets (25 mg  total) by mouth every 8 (eight) hours. 05/24/23  Yes Sharee Holster, NP  DOXYCYCLINE HYCLATE PO Take 100 mg by mouth in the morning and at bedtime. Patient not taking: Reported on 06/18/2023    [provider]    Physical Exam: Vitals:   06/18/23 1400 06/18/23 1415 06/18/23 1430 06/18/23 1445  BP: (!) 145/87 (!) 151/103 (!) 143/78 (!) 162/78  Pulse: (!) 108 (!) 125 (!) 132 (!) 122  Resp: 17 19 (!) 27 18  Temp:      TempSrc:      SpO2: 91% (!) 88% 90% 90%   GENERAL:  A&O x 3, NAD, well developed, cooperative, follows commands HEENT: [place]/AT, No thrush, No icterus, No oral ulcers Neck:  No neck mass, No meningismus, soft, supple CV: IRRR, no S3, no S4, no rub, no JVD Lungs:  bilateral rales.  Bilateral exp wheeze Abd: soft/mild diffuse +BS, nondistended Ext: No edema, no lymphangitis, no cyanosis, no rashes Neuro:  CN II-XII intact, strength 4/5 in RUE, RLE, strength 4/5 LUE, LLE; sensation intact bilateral; no dysmetria; babinski equivocal  Data Reviewed: Data reviewed above in the history  Assessment and Plan: Acute respiratory failure with hypoxia -Secondary to aspiration pneumonia -Stable on 2 L nasal cannula -Presented with tachypnea and hypoxia  Aspiration pneumonia -start zosyn -Speech therapy evaluation  Dysphagia -GI consult -Clear liquids for now  Permanent atrial fibrillation with RVR -Echocardiogram -Continue diltiazem drip -TSH -Start IV heparin until until patient is able to tolerate pills  Chronic pain syndrome/chronic back pain/opioid dependence -She is on a fentanyl patch, 25 mcg chronically -She is on tramadol for breakthrough pain -Continue gabapentin if able to swallow  Chronic HFmrEF -Appears clinically euvolemic -10/27/2021 echo EF 50-55%,  no WMA, trivial MR -Holding furosemide temporarily  Restless leg syndrome -Restart ropinirole when patient able to tolerate p.o.  GERD -Pantoprazole twice daily IV  Depression/anxiety -Restart  fluoxetine 60 mg daily when able to tolerate p.o. -Restart BuSpar once able to tolerate p.o.  Hyponatremia -Secondary to poor solute intake and volume depletion -Judicious IV normal saline   Advance Care Planning: DNR--confirmed with patient  Consults: GI  Family Communication: daughter updated 6/28  Severity of Illness: The appropriate patient status for this patient is INPATIENT. Inpatient status is judged to be reasonable and necessary in order to provide the required intensity of service to ensure the patient's safety. The patient's presenting symptoms, physical exam findings, and initial radiographic and laboratory data in the context of their chronic comorbidities is felt to place them at high risk for further clinical deterioration. Furthermore, it is not anticipated that the patient will be medically stable for discharge from the hospital within 2 midnights of admission.   * I certify that at the point of admission it is my clinical judgment that the patient will require inpatient hospital care spanning beyond 2 midnights from the point of admission due to high intensity of service, high risk for further deterioration and high frequency of surveillance required.*  Author: Catarina Hartshorn, MD 06/18/2023 3:18 PM  For on call review [email or web address].

## 2023-06-18 NOTE — ED Triage Notes (Signed)
Pt from [facility] with reports of abdominal pain and unable to swallow x 1 week. Pt also reports nausea. Pt a-fib with a rate of 110-140

## 2023-06-18 NOTE — Hospital Course (Signed)
82 year old female with a history of chronic back pain status post fusion, chronic lower extremity edema, coronary artery disease, HFmrEF (50-55%), permanent atrial fibrillation, hypertension, hyperlipidemia, restless leg syndrome, esophageal stricture, paraesophageal hernia status postsurgical repair, depression/anxiety presenting with increasing difficulty swallowing over the past month, worsening over the past 2 days.  He was also been complaining of lower abdominal pain.  Notably, the patient had a paraesophageal hernia repair, Heller myotomy, and gastropexy at [facility] on 02/17/22.  The patient had a complex postoperative history.  She began experiencing dysphagia again at the end of March 2023.  She was readmitted back to [facility] from 03/17/2022 to 04/13/2022.  She had a prolonged hospitalization which was complicated by development of atrial fibrillation with RVR and aspiration pneumonia.  She required intubation because of respiratory failure from her aspiration.  Subsequently, she underwent EGD on 03/27/2022 which showed severe postoperative esophageal narrowing at the GE junction dilated with a 10 to 12 mm CRE dilatation.  There is food noted in the stomach with obstruction views of the cardia.  After the EGD, the patient experienced pharyngoesophageal deficits impacting her swallowing.  After goals of care discussion with the patient's family they elected to proceed with a gastrostomy tube placement which was performed on 04/07/2022.  She was started on enteral feedings which she tolerated and ultimately discharged to a skilled nursing facility on 04/13/2022.   Thereafter, pt was ultimate evaluated by ENT and underwent FEES on 06/16/22 which suggested pt could begin liquid diet.  She continued to follow up with speech therapy with serial MBS studies and her diet was gradually advanced.  Gastrostomy tube was removed in December 2023, and the patient has been eating by mouth since that period of time.  However over  the next several months, she has noted increasing dysphagia type symptoms.  This has been worsening despite crushing her pills and eating softer food.  She has had to basically tolerate what amounts to a full liquid and soft diet.  However in the past few days prior to admission she has had increasing regurgitation, hypersalivation, and dry heaving.  She has had difficulty tolerating her pills.  She has had increasing shortness of breath over the past few days.  She denies any frank chest pain, fevers, chills, diarrhea, hematochezia, melena.  She has had some urinary frequency but denies any frank dysuria.  There is no hematemesis. Because she has had increasing difficulty tolerating pills she has not been able to take her cardiac medications or pain medications. In the ED, the patient was afebrile, but tachycardic in the 140s.  She was hemodynamically stable.  Oxygen saturation was 87% on 2 L.  She was placed on nasal cannula.  WBC 6.5, hemoglobin 9.6, platelets 171.  Sodium 131, potassium 3.7, bicarbonate 24, serum creatinine 0.50.  AST 216, ALT 210, alk phos 50-50, total bili 0.8.  EKG showed atrial fibrillation with RVR.  There is no ST-T wave changes.  CT chest and abdomen showed multiple airspace opacities bilaterally consistent with multifocal pneumonia.  There is moderate renal atrophy.  There is pancreatic atrophy.  There is a periumbilical hernia.  Stomach was within normal limits.  There is no bowel obstruction.  She was started on diltiazem drip, ceftriaxone, azithromycin.

## 2023-06-18 NOTE — Progress Notes (Addendum)
ANTICOAGULATION CONSULT NOTE - Initial Consult  Pharmacy Consult for heparin Indication: atrial fibrillation  Allergies  Allergen Reactions   Buprenorphine Hcl Itching   Cymbalta [Duloxetine Hcl] Other (See Comments)    Other reaction(s): Other (See Comments) Personality changes - crying  2014   Morphine And Codeine Itching   Oxycodone-Acetaminophen Other (See Comments)    Personality changes    Valium [Diazepam] Other (See Comments)    Personality changes - "in another world" ;  Hallucinations  2015   Statins Other (See Comments)    Other reaction(s): Other (See Comments) Muscle weakness Muscle weakness   Morphine Itching   Altace [Ramipril] Other (See Comments)    unknown   Floxin [Ofloxacin] Other (See Comments)    unknown   Hydromorphone Other (See Comments)    Other reaction(s): Confusion (intolerance) unknown   Lovaza [Omega-3-Acid Ethyl Esters] Other (See Comments)    Muscle weakness    Trilipix [Choline Fenofibrate] Other (See Comments)    Muscle weakness    Zetia [Ezetimibe] Other (See Comments)    Muscle weakness     Patient Measurements:   Heparin Dosing Weight: 80 kg  Vital Signs: Temp: 99.1 F (37.3 C) (06/28 1538) Temp Source: Oral (06/28 1538) BP: 162/78 (06/28 1445) Pulse Rate: 122 (06/28 1445)  Labs: Recent Labs    06/18/23 1144  HGB 11.6*  HCT 36.3  PLT 171  CREATININE 0.50    Estimated Creatinine Clearance: 60.2 mL/min (by C-G formula based on SCr of 0.5 mg/dL).   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 difficile colitis 07/21/2022   CAD (coronary artery disease)    a. Abnl nuc 11/2014. Cath 12/2014 - turned down for CABG. Ultimately s/p TTVP, rotational  atherectomy, PTCA and stenting of the ostial LCx and left main into the LAD (crush technique), and IVUS of the LAD/Left main. // b. Myoview 11/17: EF 48, poor quality/significant artifact; inf-lateral, inferior ischemia; Intermediate Risk   Chronic atrial fibrillation (HCC)    a.  First noted post-op 9/15 spinal fusion.  She had cardioversion, not on anticoagulation. Fall risk, unsteady. // failed DCCV // Holter 10/17: AFib, Avg HR 97, PVCs, no other arrhythmia   Chronic back pain greater than 3 months duration    a. spinal stenosis.  Spinal fusion with rods in 2/15 at [facility] spinal fusion 9/15.   Chronic diastolic CHF    Echo 2/17:  EF 50-55%, trivial AI, midl MR, mod LAE, PASP 37 mmHg // Echocardiogram 03/2020: EF 45-50, no RWMA, mild LVH, mild reduced RVSF, mildly elevated PASP (RVSP 38.3), mild LAE, mild MR, mild to mod TR, trivial AI // Echo 9/21: EF 45, mild LVH, mildly reduced RV SF, moderate LAE, mild RAE, mild MR, mild AI      Circadian rhythm sleep disorder    CTS (carpal tunnel syndrome)    Deficiency anemia 11/10/2014   Depression    Diarrhea    Diverticulitis of colon    Esophageal stricture    Gastritis    Gastroesophageal hernia 07/06/2013   GERD (gastroesophageal reflux disease)    Hiatal hernia    History of blood transfusion    "most of them related to OR's"  HTN (hypertension)    Hyperlipidemia    IBS (irritable bowel syndrome)    Incontinence 10/13/2012   Insomnia    Ischemic chest pain (HCC)    Memory disorder 12/04/2014   Metabolic syndrome 04/24/2014   MGUS (monoclonal gammopathy of unknown significance) dx'd 11/2014   a. Neg BMB 11/2014.   Multiple falls    Obesity    Obstructive sleep apnea    "have mask; don't wear it" (01/07/2015)   Orthostasis    PAT (paroxysmal atrial tachycardia)    Personal history of colonic polyps 10/25/2011 & 12/02/11   not retrieved Dr Victorino Dike & tubular adenomas   Pneumonia 03/2014   Sinus bradycardia    a.  Baseline HR 50s-60s.   Stroke [facility]) early 2000's   "small"; denies residual on 01/07/2015)    Medications:  (Not in a hospital admission)   Assessment: Pharmacy consulted to dose heparin in patient with atrial fibrillation.  Patient is on Eliquis prior to admission with last dose 6/27 @ 2200.  Will need to monitor aPTT until correlation with heparin levels.  Goal of Therapy:  Heparin level 0.3-0.7 units/ml aPTT 66-102 seconds Monitor platelets by anticoagulation protocol: Yes   Plan:  Give 3000 units bolus x 1 Start heparin infusion at 1100 units/hr Check anti-Xa level in 8 hours and daily while on heparin  Judeth Cornfield, PharmD Clinical Pharmacist 06/18/2023 4:02 PM

## 2023-06-18 NOTE — ED Notes (Signed)
Tried to call ICU and they advised that there was no nurse available to take report for room 19.

## 2023-06-18 NOTE — ED Notes (Signed)
ED TO INPATIENT HANDOFF REPORT  ED Nurse Name and Phone #:   S Name/Age/Gender [name] [name] 82 y.o. female Room/Bed: APA19/APA19  Code Status   Code Status: DNR  Home/SNF/Other Home Patient oriented to: self, place, time, and situation Is this baseline? Yes   Triage Complete: Triage complete  Chief Complaint Aspiration pneumonitis [facility]) [J69.0]  Triage Note Pt from [employer] with reports of abdominal pain and unable to swallow x 1 week. Pt also reports nausea. Pt a-fib with a rate of 110-140   Allergies Allergies  Allergen Reactions   Buprenorphine Hcl Itching   Cymbalta [Duloxetine Hcl] Other (See Comments)    Other reaction(s): Other (See Comments) Personality changes - crying  2014   Morphine And Codeine Itching   Oxycodone-Acetaminophen Other (See Comments)    Personality changes    Valium [Diazepam] Other (See Comments)    Personality changes - "in another world" ;  Hallucinations  2015   Statins Other (See Comments)    Other reaction(s): Other (See Comments) Muscle weakness Muscle weakness   Morphine Itching   Altace [Ramipril] Other (See Comments)    unknown   Floxin [Ofloxacin] Other (See Comments)    unknown   Hydromorphone Other (See Comments)    Other reaction(s): Confusion (intolerance) unknown   Lovaza [Omega-3-Acid Ethyl Esters] Other (See Comments)    Muscle weakness    Trilipix [Choline Fenofibrate] Other (See Comments)    Muscle weakness    Zetia [Ezetimibe] Other (See Comments)    Muscle weakness     Level of Care/Admitting Diagnosis ED Disposition     ED Disposition  Admit   Condition  --   Comment  Hospital Area: [facility] [[id number]]  Level of Care: Stepdown [14]  Covid Evaluation: Confirmed COVID Negative  Diagnosis: Aspiration pneumonitis [facility]) [[id number]]  Admitting Physician: TAT, DAVID [email or web address]  Attending Physician: TAT, DAVID [email or web address]  Certification:: I certify this patient will need inpatient services for at  least 2 midnights  Estimated Length of Stay: 2          B Medical/Surgery History Past Medical History:  Diagnosis Date   Abnormal nuclear cardiac imaging test    Anemia, iron deficiency 07/02/2015   Anxiety    Arthritis    "knees, back, fingers, toes; joints" (01/07/2015)   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 difficile colitis 07/21/2022   CAD (coronary artery disease)    a. Abnl nuc 11/2014. Cath 12/2014 - turned down for CABG. Ultimately s/p TTVP, rotational atherectomy, PTCA and stenting of the ostial LCx and left main into the LAD (crush technique), and IVUS of the LAD/Left main. // b. Myoview 11/17: EF 48, poor quality/significant artifact; inf-lateral, inferior ischemia; Intermediate Risk   Chronic atrial fibrillation (HCC)    a.  First noted post-op 9/15 spinal fusion.  She had cardioversion, not on anticoagulation. Fall risk, unsteady. // failed DCCV // Holter 10/17: AFib, Avg HR 97, PVCs, no other arrhythmia   Chronic back pain greater than 3 months duration    a. spinal stenosis.  Spinal fusion with rods in 2/15 at [facility] spinal fusion 9/15.   Chronic diastolic CHF    Echo 2/17:  EF 50-55%, trivial AI, midl MR, mod LAE, PASP 37 mmHg // Echocardiogram 03/2020: EF 45-50, no RWMA, mild LVH, mild reduced RVSF, mildly elevated PASP (RVSP 38.3), mild LAE, mild MR, mild to mod TR, trivial AI // Echo 9/21:  EF 45, mild LVH, mildly reduced RV SF, moderate LAE, mild RAE, mild MR, mild AI      Circadian rhythm sleep disorder    CTS (carpal tunnel syndrome)    Deficiency anemia 11/10/2014   Depression    Diarrhea    Diverticulitis of colon    Esophageal stricture    Gastritis    Gastroesophageal hernia 07/06/2013   GERD (gastroesophageal reflux disease)    Hiatal hernia    History of blood transfusion    "most of them related to OR's"    HTN (hypertension)    Hyperlipidemia    IBS (irritable bowel  syndrome)    Incontinence 10/13/2012   Insomnia    Ischemic chest pain (HCC)    Memory disorder 12/04/2014   Metabolic syndrome 04/24/2014   MGUS (monoclonal gammopathy of unknown significance) dx'd 11/2014   a. Neg BMB 11/2014.   Multiple falls    Obesity    Obstructive sleep apnea    "have mask; don't wear it" (01/07/2015)   Orthostasis    PAT (paroxysmal atrial tachycardia)    Personal history of colonic polyps 10/25/2011 & 12/02/11   not retrieved Dr Victorino Dike & tubular adenomas   Pneumonia 03/2014   Sinus bradycardia    a. Baseline HR 50s-60s.   Stroke [facility]) early 2000's   "small"; denies residual on 01/07/2015)   Past Surgical History:  Procedure Laterality Date   BACK SURGERY     BONE MARROW BIOPSY  11/2014   CARDIAC CATHETERIZATION  12/27/2014   Procedure: INTRAVASCULAR PRESSURE WIRE/FFR STUDY;  Surgeon: Laurey Morale, MD;  Location[facility] C[facility] LAB;  Service: Cardiovascular;;   CARDIAC CATHETERIZATION N/A 11/25/2016   Procedure: Left Heart Cath and Coronary Angiography;  Surgeon: Tonny Bollman, MD;  Location: [facility] INVASIVE CV LAB;  Service: Cardiovascular;  Laterality: N/A;   CARDIOVERSION N/A 02/13/2016   Procedure: CARDIOVERSION;  Surgeon: Wendall Stade, MD;  Location: [facility] ENDOSCOPY;  Service: Cardiovascular;  Laterality: N/A;   CARPAL TUNNEL RELEASE Right 1980's   CATARACT EXTRACTION Bilateral    CATARACT EXTRACTION W/ INTRAOCULAR LENS  IMPLANT, BILATERAL  2000's   CORONARY ANGIOPLASTY WITH STENT PLACEMENT  01/07/2015   "2"   CORONARY STENT PLACEMENT     DILATION AND CURETTAGE OF UTERUS     ESOPHAGOGASTRODUODENOSCOPY (EGD) WITH ESOPHAGEAL DILATION  "several times"   IR GASTROSTOMY TUBE REMOVAL  12/23/2022   IR REPLC GASTRO/COLONIC TUBE PERCUT W/FLUORO  09/08/2022   JOINT REPLACEMENT     KNEE ARTHROSCOPY Left 1995   LAPAROSCOPIC CHOLECYSTECTOMY  2003   LEFT HEART CATHETERIZATION WITH CORONARY ANGIOGRAM N/A 12/27/2014   Procedure: LEFT HEART CATHETERIZATION WITH CORONARY  ANGIOGRAM;  Surgeon: Laurey Morale, MD;  Location: [facility] CATH LAB;  Service: Cardiovascular;  Laterality: N/A;   PERCUTANEOUS CORONARY ROTOBLATOR INTERVENTION (PCI-R)  01/07/2015   PERCUTANEOUS CORONARY ROTOBLATOR INTERVENTION (PCI-R) N/A 01/07/2015   Procedure: PERCUTANEOUS CORONARY ROTOBLATOR INTERVENTION (PCI-R);  Surgeon: Micheline Chapman, MD;  Location: [facility] CATH LAB;  Service: Cardiovascular;  Laterality: N/A;   POSTERIOR FUSION THORACIC SPINE  08/2015   POSTERIOR LUMBAR FUSION  01/2015   TOTAL KNEE ARTHROPLASTY Bilateral 1990's - 2000's     A IV Location/Drains/Wounds Patient Lines/Drains/Airways Status     Active Line/Drains/Airways     Name Placement date Placement time Site Days   Peripheral IV 06/18/23 22 G Right Hand 06/18/23  1245  Hand  less than 1   Peripheral IV 06/18/23 20 G 1.88" Posterior;Proximal;Right Forearm 06/18/23  1725  Forearm  less than 1   Gastrostomy/Enterostomy Gastrostomy 18 Fr. LUQ 09/08/22  1523  LUQ  283            Intake/Output Last 24 hours  Intake/Output Summary (Last 24 hours) at 06/18/2023 1750 Last data filed at 06/18/2023 1534 Gross per 24 hour  Intake 2780 ml  Output --  Net 2780 ml    Labs/Imaging Results for orders placed or performed during the hospital encounter of 06/18/23 (from the past 48 hour(s))  CBC with Differential     Status: Abnormal   Collection Time: 06/18/23 11:44 AM  Result Value Ref Range   WBC 6.5 4.0 - 10.5 K/uL   RBC 4.15 3.87 - 5.11 MIL/uL   Hemoglobin 11.6 (L) 12.0 - 15.0 g/dL   HCT 16.1 09.6 - 04.5 %   MCV 87.5 80.0 - 100.0 fL   MCH 28.0 26.0 - 34.0 pg   MCHC 32.0 30.0 - 36.0 g/dL   RDW 40.9 (H) 81.1 - 91.4 %   Platelets 171 150 - 400 K/uL   nRBC 0.0 0.0 - 0.2 %   Neutrophils Relative % 78 %   Neutro Abs 5.0 1.7 - 7.7 K/uL   Lymphocytes Relative 17 %   Lymphs Abs 1.1 0.7 - 4.0 K/uL   Monocytes Relative 5 %   Monocytes Absolute 0.3 0.1 - 1.0 K/uL   Eosinophils Relative 0 %   Eosinophils Absolute 0.0  0.0 - 0.5 K/uL   Basophils Relative 0 %   Basophils Absolute 0.0 0.0 - 0.1 K/uL   Immature Granulocytes 0 %   Abs Immature Granulocytes 0.02 0.00 - 0.07 K/uL    Comment: Performed at [facility], [address] [place], [place] [address]  Comprehensive metabolic panel     Status: Abnormal   Collection Time: 06/18/23 11:44 AM  Result Value Ref Range   Sodium 131 (L) 135 - 145 mmol/L   Potassium 3.7 3.5 - 5.1 mmol/L   Chloride 97 (L) 98 - 111 mmol/L   CO2 24 22 - 32 mmol/L   Glucose, Bld 130 (H) 70 - 99 mg/dL    Comment: Glucose reference range applies only to samples taken after fasting for at least 8 hours.   BUN 10 8 - 23 mg/dL   Creatinine, Ser 6.21 0.44 - 1.00 mg/dL   Calcium 8.3 (L) 8.9 - 10.3 mg/dL   Total Protein 6.9 6.5 - 8.1 g/dL   Albumin 3.4 (L) 3.5 - 5.0 g/dL   AST 308 (H) 15 - 41 U/L   ALT 210 (H) 0 - 44 U/L   Alkaline Phosphatase 250 (H) 38 - 126 U/L   Total Bilirubin 0.8 0.3 - 1.2 mg/dL   GFR, Estimated >65 >78 mL/min    Comment: (NOTE) Calculated using the CKD-EPI Creatinine Equation (2021)    Anion gap 10 5 - 15    Comment: Performed at [facility], [address], [place], [place] [address]  Blood culture (routine x 2)     Status: None (Preliminary result)   Collection Time: 06/18/23  2:39 PM   Specimen: BLOOD  Result Value Ref Range   Specimen Description BLOOD BLOOD LEFT ARM    Special Requests      BOTTLES DRAWN AEROBIC AND ANAEROBIC Blood Culture adequate volume Performed at [facility], [address] [place], [place] [address]    Culture PENDING    Report Status PENDING   Lactic acid, plasma     Status: None  Collection Time: 06/18/23  2:39 PM  Result Value Ref Range   Lactic Acid, Venous 1.0 0.5 - 1.9 mmol/L    Comment: Performed at [facility], [address] [place], [place] [address]  Blood culture (routine x 2)     Status: None (Preliminary result)   Collection Time: 06/18/23  2:44 PM   Specimen: BLOOD  Result Value Ref Range    Specimen Description BLOOD BLOOD LEFT HAND    Special Requests      BOTTLES DRAWN AEROBIC AND ANAEROBIC Blood Culture adequate volume Performed at [facility], [address] [place], [place] [address]    Culture PENDING    Report Status PENDING   Lactic acid, plasma     Status: None   Collection Time: 06/18/23  4:20 PM  Result Value Ref Range   Lactic Acid, Venous 0.9 0.5 - 1.9 mmol/L    Comment: Performed at [facility], [address], [place], [place] [address]   *Note: Due to a large number of results and/or encounters for the requested time period, some results have not been displayed. A complete set of results can be found in Results Review.   US Abdomen Limited RUQ (LIVER/GB)  Result Date: 06/18/2023 CLINICAL DATA:  Elevated transaminase. EXAM: ULTRASOUND ABDOMEN LIMITED RIGHT UPPER QUADRANT COMPARISON:  Ultrasound June 2018.  CT scan earlier 06/18/2023 FINDINGS: Gallbladder: Previous surgical removal. Common bile duct: Diameter: 8 mm. Within normal limits for the patient's age in the post cholecystectomy state. This measured 10 mm in 2018. Liver: Homogeneous hepatic parenchyma. Mild intrahepatic biliary duct ectasia. Portal vein is patent on color Doppler imaging with normal direction of blood flow towards the liver. Other: Study limited by overlapping bowel gas and soft tissue. IMPRESSION: Previous cholecystectomy. Stable prominent common duct and some mild intrahepatic biliary duct ectasia. Study limited by overlapping bowel gas and soft tissue. Electronically Signed   By: Karen Kays M.D.   On: 06/18/2023 17:25   CT CHEST ABDOMEN PELVIS WO CONTRAST  Result Date: 06/18/2023 CLINICAL DATA:  Dysphagia, acute generalized abdominal pain. EXAM: CT CHEST, ABDOMEN AND PELVIS WITHOUT CONTRAST TECHNIQUE: Multidetector CT imaging of the chest, abdomen and pelvis was performed following the standard protocol without IV contrast. RADIATION DOSE REDUCTION: This exam was performed according to  the departmental dose-optimization program which includes automated exposure control, adjustment of the mA and/or kV according to patient size and/or use of iterative reconstruction technique. COMPARISON:  March 17, 2022. FINDINGS: CT CHEST FINDINGS Cardiovascular: Atherosclerosis of thoracic aorta is noted without aneurysm formation. Mild cardiomegaly. No pericardial effusion. Coronary artery calcifications are noted. Mediastinum/Nodes: No enlarged mediastinal, hilar, or axillary lymph nodes. Thyroid gland, trachea, and esophagus demonstrate no significant findings. Lungs/Pleura: No pneumothorax or pleural effusion is noted. Multiple patchy airspace opacities are noted bilaterally consistent with multifocal pneumonia. Mild left posterior basilar subsegmental atelectasis is noted. Musculoskeletal: Surgical posterior fusion is seen involving the thoracic and lumbar spine. No acute osseous abnormality is noted. CT ABDOMEN PELVIS FINDINGS Hepatobiliary: No focal liver abnormality is seen. Status post cholecystectomy. No biliary dilatation. Pancreas: Pancreatic atrophy is again noted. No acute abnormality seen. Spleen: Normal in size without focal abnormality. Adrenals/Urinary Tract: Adrenal glands appear normal. Mild right renal atrophy is noted. No renal or ureteral calculi are noted. No definite hydronephrosis or renal obstruction is noted. Urinary bladder is unremarkable. Stomach/Bowel: Stomach is within normal limits. Appendix appears normal. No evidence of bowel wall thickening, distention, or inflammatory changes. Vascular/Lymphatic: Aortic atherosclerosis. No enlarged abdominal or  pelvic lymph nodes. Reproductive: Uterus and bilateral adnexa are unremarkable. Other: Small fat containing periumbilical hernia. No ascites is noted. Musculoskeletal: Status post surgical posterior fusion of lumbar spine as well. No acute osseous abnormality is noted. IMPRESSION: Multiple airspace opacities are noted bilaterally  consistent with multifocal pneumonia. Mild left posterior basilar subsegmental atelectasis is noted. Mild right renal atrophy is again noted. Pancreatic atrophy is again noted. Small fat containing periumbilical hernia. Status post surgical posterior fusion involving thoracic and lumbar spine. Aortic Atherosclerosis (ICD10-I70.0). Electronically Signed   By: Lupita Raider M.D.   On: 06/18/2023 12:57   DG Chest Port 1 View  Result Date: 06/18/2023 CLINICAL DATA:  82 year old female with shortness of breath, abdominal pain and difficulty swallowing for 1 week. EXAM: PORTABLE CHEST 1 VIEW COMPARISON:  Chest radiographs 02/23/2023 and earlier. FINDINGS: Portable AP upright view at 1220 hours. Stable lung volumes and mediastinal contours. Chronic eventration of the right hemidiaphragm is stable. Improved left lung base ventilation since March. But there remains blunting of the left lateral costophrenic angle, and streaky retrocardiac opacity. No superimposed pneumothorax or pulmonary edema. No other confluent opacity. Extensive posterior spinal fusion hardware redemonstrated, grossly stable. Stable cholecystectomy clips. Paucity of bowel gas in the visible upper abdomen. IMPRESSION: Improved left lung base ventilation since March, but evidence of a small left pleural effusion and residual streaky, nonspecific retrocardiac opacity. Electronically Signed   By: Odessa Fleming M.D.   On: 06/18/2023 12:29    Pending Labs Unresulted Labs (From admission, onward)     Start     Ordered   06/19/23 0500  Comprehensive metabolic panel  Tomorrow morning,   R        06/18/23 1654   06/19/23 0500  CBC  Tomorrow morning,   R        06/18/23 1654   06/19/23 0500  CBC  Daily,   R      06/18/23 1604   06/19/23 0000  Heparin level (unfractionated)  Once-Timed,   TIMED        06/18/23 1604   06/19/23 0000  APTT  Once-Timed,   TIMED        06/18/23 1605   06/18/23 1655  TSH  Once,   R        06/18/23 1654   06/18/23 1655   Procalcitonin  Once,   R       References:    Procalcitonin Lower Respiratory Tract Infection AND Sepsis Procalcitonin Algorithm   06/18/23 1654   06/18/23 1655  Hepatitis panel, acute  Once,   R        06/18/23 1654   06/18/23 1655  MRSA Next Gen by PCR, Nasal  Once,   R        06/18/23 1654   06/18/23 1655  Urinalysis, w/ Reflex to Culture (Infection Suspected) -Urine, Clean Catch  (Urine Labs)  Once,   R       Question:  Specimen Source  Answer:  Urine, Clean Catch   06/18/23 1654   06/18/23 1655  Brain natriuretic peptide  Once,   R        06/18/23 1654   06/18/23 1655  Respiratory (~20 pathogens) panel by PCR  (Respiratory panel by PCR (~20 pathogens, ~24 hr TAT)  w precautions)  Once,   R        06/18/23 1654   06/18/23 1559  Resp panel by RT-PCR (RSV, Flu A&B, Covid) Anterior Nasal Swab  Once,  R        06/18/23 1558            Vitals/Pain Today's Vitals   06/18/23 1415 06/18/23 1430 06/18/23 1445 06/18/23 1538  BP: (!) 151/103 (!) 143/78 (!) 162/78   Pulse: (!) 125 (!) 132 (!) 122   Resp: 19 (!) 27 18   Temp:    99.1 F (37.3 C)  TempSrc:    Oral  SpO2: (!) 88% 90% 90%   PainSc:        Isolation Precautions Droplet precaution  Medications Medications  diltiazem (CARDIZEM) 125 mg in dextrose 5% 125 mL (1 mg/mL) infusion (5 mg/hr Intravenous New Bag/Given 06/18/23 1246)  traMADol (ULTRAM) tablet 50 mg (50 mg Oral Not Given 06/18/23 1533)  fentaNYL (DURAGESIC) 25 MCG/HR 1 patch (has no administration in time range)  busPIRone (BUSPAR) tablet 10 mg (has no administration in time range)  rOPINIRole (REQUIP) tablet 0.5 mg (has no administration in time range)  acetaminophen (TYLENOL) tablet 650 mg (has no administration in time range)    Or  acetaminophen (TYLENOL) suppository 650 mg (has no administration in time range)  ondansetron (ZOFRAN) tablet 4 mg ( Oral See Alternative 06/18/23 1720)    Or  ondansetron (ZOFRAN) injection 4 mg (4 mg Intravenous Given 06/18/23  1720)  0.9 % NaCl with KCl 20 mEq/ L  infusion ( Intravenous New Bag/Given 06/18/23 1740)  fentaNYL (SUBLIMAZE) injection 25 mcg (25 mcg Intravenous Given 06/18/23 1722)  heparin ADULT infusion 100 units/mL (25000 units/282mL) (1,100 Units/hr Intravenous New Bag/Given 06/18/23 1733)  sodium chloride 0.9 % bolus 500 mL (0 mLs Intravenous Stopped 06/18/23 1534)  diltiazem (CARDIZEM) injection 10 mg (10 mg Intravenous Given 06/18/23 1543)  cefTRIAXone (ROCEPHIN) 2 g in sodium chloride 0.9 % 100 mL IVPB (0 g Intravenous Stopped 06/18/23 1615)  azithromycin (ZITHROMAX) 500 mg in sodium chloride 0.9 % 250 mL IVPB (500 mg Intravenous New Bag/Given 06/18/23 1615)  fentaNYL (SUBLIMAZE) injection 25 mcg (25 mcg Intravenous Given 06/18/23 1539)  heparin bolus via infusion 3,000 Units (3,000 Units Intravenous Bolus from Bag 06/18/23 1734)  ondansetron (ZOFRAN) injection 4 mg (4 mg Intravenous Given 06/18/23 1736)    Mobility walks with device     Focused Assessments    R Recommendations: See Admitting Provider Note  Report given to:   Additional Notes:

## 2023-06-18 NOTE — ED Notes (Signed)
Patient transported to CT 

## 2023-06-19 ENCOUNTER — Other Ambulatory Visit (HOSPITAL_COMMUNITY): Payer: Self-pay | Admitting: *Deleted

## 2023-06-19 ENCOUNTER — Inpatient Hospital Stay (HOSPITAL_COMMUNITY): Payer: Medicare HMO

## 2023-06-19 DIAGNOSIS — I4821 Permanent atrial fibrillation: Secondary | ICD-10-CM | POA: Diagnosis not present

## 2023-06-19 DIAGNOSIS — J9601 Acute respiratory failure with hypoxia: Secondary | ICD-10-CM | POA: Diagnosis not present

## 2023-06-19 DIAGNOSIS — J690 Pneumonitis due to inhalation of food and vomit: Secondary | ICD-10-CM

## 2023-06-19 LAB — URINALYSIS, W/ REFLEX TO CULTURE (INFECTION SUSPECTED)
Bacteria, UA: NONE SEEN
Bilirubin Urine: NEGATIVE
Glucose, UA: NEGATIVE mg/dL
Ketones, ur: 20 mg/dL — AB
Leukocytes,Ua: NEGATIVE
Nitrite: NEGATIVE
Protein, ur: 30 mg/dL — AB
Specific Gravity, Urine: 1.018 (ref 1.005–1.030)
pH: 5 (ref 5.0–8.0)

## 2023-06-19 LAB — CBC
HCT: 35.3 % — ABNORMAL LOW (ref 36.0–46.0)
Hemoglobin: 11.1 g/dL — ABNORMAL LOW (ref 12.0–15.0)
MCH: 28 pg (ref 26.0–34.0)
MCHC: 31.4 g/dL (ref 30.0–36.0)
MCV: 89.1 fL (ref 80.0–100.0)
Platelets: 166 10*3/uL (ref 150–400)
RBC: 3.96 MIL/uL (ref 3.87–5.11)
RDW: 15.6 % — ABNORMAL HIGH (ref 11.5–15.5)
WBC: 6.1 10*3/uL (ref 4.0–10.5)
nRBC: 0 % (ref 0.0–0.2)

## 2023-06-19 LAB — COMPREHENSIVE METABOLIC PANEL
ALT: 142 U/L — ABNORMAL HIGH (ref 0–44)
AST: 109 U/L — ABNORMAL HIGH (ref 15–41)
Albumin: 3 g/dL — ABNORMAL LOW (ref 3.5–5.0)
Alkaline Phosphatase: 194 U/L — ABNORMAL HIGH (ref 38–126)
Anion gap: 9 (ref 5–15)
BUN: 11 mg/dL (ref 8–23)
CO2: 23 mmol/L (ref 22–32)
Calcium: 8.2 mg/dL — ABNORMAL LOW (ref 8.9–10.3)
Chloride: 102 mmol/L (ref 98–111)
Creatinine, Ser: 0.59 mg/dL (ref 0.44–1.00)
GFR, Estimated: >60 mL/min (ref >60)
Glucose, Bld: 89 mg/dL (ref 70–99)
Potassium: 3.6 mmol/L (ref 3.5–5.1)
Sodium: 134 mmol/L — ABNORMAL LOW (ref 135–145)
Total Bilirubin: 0.6 mg/dL (ref 0.3–1.2)
Total Protein: 6.5 g/dL (ref 6.5–8.1)

## 2023-06-19 LAB — RESP PANEL BY RT-PCR (RSV, FLU A&B, COVID)  RVPGX2
Influenza A by PCR: NEGATIVE
Influenza B by PCR: NEGATIVE
Resp Syncytial Virus by PCR: NEGATIVE
SARS Coronavirus 2 by RT PCR: NEGATIVE

## 2023-06-19 LAB — RESPIRATORY PANEL BY PCR

## 2023-06-19 LAB — APTT
aPTT: 115 seconds — ABNORMAL HIGH (ref 24–36)
aPTT: 73 seconds — ABNORMAL HIGH (ref 24–36)
aPTT: 89 seconds — ABNORMAL HIGH (ref 24–36)

## 2023-06-19 LAB — MRSA NEXT GEN BY PCR, NASAL: MRSA by PCR Next Gen: NOT DETECTED

## 2023-06-19 LAB — HEPARIN LEVEL (UNFRACTIONATED)
Heparin Unfractionated: >1.1 IU/mL — ABNORMAL HIGH (ref 0.30–0.70)
Heparin Unfractionated: >1.1 IU/mL — ABNORMAL HIGH (ref 0.30–0.70)

## 2023-06-19 MED ORDER — LIDOCAINE 5 % EX PTCH
1.0000 | MEDICATED_PATCH | CUTANEOUS | Status: DC
  Administered 2023-06-19 – 2023-06-24 (×7): 1 via TRANSDERMAL
  Filled 2023-06-19 (×7): qty 1

## 2023-06-19 MED ORDER — FENTANYL 50 MCG/HR TD PT72
1.0000 | MEDICATED_PATCH | TRANSDERMAL | Status: DC
  Administered 2023-06-19 – 2023-06-28 (×4): 1 via TRANSDERMAL
  Filled 2023-06-19 (×4): qty 1

## 2023-06-19 MED ORDER — DIAZEPAM 5 MG/ML IJ SOLN
2.5000 mg | Freq: Four times a day (QID) | INTRAMUSCULAR | Status: DC | PRN
  Administered 2023-06-19 – 2023-06-22 (×5): 2.5 mg via INTRAVENOUS
  Filled 2023-06-19 (×6): qty 2

## 2023-06-19 MED ORDER — CHLORHEXIDINE GLUCONATE CLOTH 2 % EX PADS
6.0000 | MEDICATED_PAD | Freq: Every day | CUTANEOUS | Status: DC
  Administered 2023-06-19 – 2023-06-22 (×5): 6 via TOPICAL

## 2023-06-19 MED ORDER — FENTANYL CITRATE PF 50 MCG/ML IJ SOSY
25.0000 ug | PREFILLED_SYRINGE | Freq: Once | INTRAMUSCULAR | Status: AC
  Administered 2023-06-19: 25 ug via INTRAVENOUS
  Filled 2023-06-19: qty 1

## 2023-06-19 MED ORDER — GABAPENTIN 300 MG PO CAPS: 300.0000 mg | ORAL_CAPSULE | Freq: Three times a day (TID) | ORAL | Status: DC

## 2023-06-19 MED ORDER — POTASSIUM CHLORIDE IN NACL 20-0.9 MEQ/L-% IV SOLN: INTRAVENOUS | Status: AC

## 2023-06-19 MED ORDER — PANTOPRAZOLE SODIUM 40 MG IV SOLR
40.0000 mg | Freq: Two times a day (BID) | INTRAVENOUS | Status: DC
  Administered 2023-06-19 – 2023-06-22 (×7): 40 mg via INTRAVENOUS
  Filled 2023-06-19 (×8): qty 10

## 2023-06-19 NOTE — Progress Notes (Signed)
ANTICOAGULATION CONSULT NOTE  Pharmacy Consult for heparin Indication: atrial fibrillation  Allergies  Allergen Reactions   Buprenorphine Hcl Itching   Cymbalta [Duloxetine Hcl] Other (See Comments)    Other reaction(s): Other (See Comments) Personality changes - crying  2014   Morphine And Codeine Itching   Oxycodone-Acetaminophen Other (See Comments)    Personality changes    Valium [Diazepam] Other (See Comments)    Personality changes - "in another world" ;  Hallucinations  2015   Statins Other (See Comments)    Other reaction(s): Other (See Comments) Muscle weakness Muscle weakness   Morphine Itching   Altace [Ramipril] Other (See Comments)    unknown   Floxin [Ofloxacin] Other (See Comments)    unknown   Hydromorphone Other (See Comments)    Other reaction(s): Confusion (intolerance) unknown   Lovaza [Omega-3-Acid Ethyl Esters] Other (See Comments)    Muscle weakness    Trilipix [Choline Fenofibrate] Other (See Comments)    Muscle weakness    Zetia [Ezetimibe] Other (See Comments)    Muscle weakness     Patient Measurements:   Heparin Dosing Weight: 80 kg  Vital Signs: Temp: 99.5 F (37.5 C) (06/28 2050) Temp Source: Axillary (06/28 2050) BP: 137/57 (06/28 2200) Pulse Rate: 74 (06/28 2200)  Labs: Recent Labs    06/18/23 1144 06/18/23 2349  HGB 11.6*  --   HCT 36.3  --   PLT 171  --   APTT  --  89*  HEPARINUNFRC  --  >1.10*  CREATININE 0.50  --      Estimated Creatinine Clearance: 60.2 mL/min (by C-G formula based on SCr of 0.5 mg/dL).   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 difficile colitis 07/21/2022   CAD (coronary artery disease)    a. Abnl nuc 11/2014. Cath  12/2014 - turned down for CABG. Ultimately s/p TTVP, rotational atherectomy, PTCA and stenting of the ostial LCx and left main into the LAD (crush technique), and IVUS of the LAD/Left main. // b. Myoview 11/17: EF 48, poor quality/significant artifact; inf-lateral, inferior ischemia; Intermediate Risk   Chronic atrial fibrillation (HCC)    a.  First noted post-op 9/15 spinal fusion.  She had cardioversion, not on anticoagulation. Fall risk, unsteady. // failed DCCV // Holter 10/17: AFib, Avg HR 97, PVCs, no other arrhythmia   Chronic back pain greater than 3 months duration    a. spinal stenosis.  Spinal fusion with rods in 2/15 at [facility] spinal fusion 9/15.   Chronic diastolic CHF    Echo 2/17:  EF 50-55%, trivial AI, midl MR, mod LAE, PASP 37 mmHg // Echocardiogram 03/2020: EF 45-50, no RWMA, mild LVH, mild reduced RVSF, mildly elevated PASP (RVSP 38.3), mild LAE, mild MR, mild to mod TR, trivial AI // Echo 9/21: EF 45, mild LVH, mildly reduced RV SF, moderate LAE, mild RAE, mild MR, mild AI      Circadian rhythm sleep disorder    CTS (carpal tunnel syndrome)    Deficiency anemia 11/10/2014   Depression    Diarrhea    Diverticulitis of colon    Esophageal stricture    Gastritis    Gastroesophageal hernia 07/06/2013   GERD (gastroesophageal reflux  disease)    Hiatal hernia    History of blood transfusion    "most of them related to OR's"    HTN (hypertension)    Hyperlipidemia    IBS (irritable bowel syndrome)    Incontinence 10/13/2012   Insomnia    Ischemic chest pain (HCC)    Memory disorder 12/04/2014   Metabolic syndrome 04/24/2014   MGUS (monoclonal gammopathy of unknown significance) dx'd 11/2014   a. Neg BMB 11/2014.   Multiple falls    Obesity    Obstructive sleep apnea    "have mask; don't wear it" (01/07/2015)   Orthostasis    PAT (paroxysmal atrial tachycardia)    Personal history of colonic polyps 10/25/2011 & 12/02/11   not retrieved Dr Victorino Dike &  tubular adenomas   Pneumonia 03/2014   Sinus bradycardia    a. Baseline HR 50s-60s.   Stroke [facility]) early 2000's   "small"; denies residual on 01/07/2015)    Medications:  Medications Prior to Admission  Medication Sig Dispense Refill Last Dose   acetaminophen (TYLENOL) 325 MG tablet Take 650 mg by mouth 3 (three) times daily.   06/17/2023   aspirin EC 81 MG tablet Take 81 mg by mouth daily. Swallow whole.   06/17/2023   busPIRone (BUSPAR) 10 MG tablet Take 10 mg by mouth 3 (three) times daily.   06/17/2023   clobetasol (TEMOVATE) 0.05 % external solution Apply 1 application. topically 2 (two) times a week. Monday and Friday   06/14/2023   diclofenac Sodium (VOLTAREN) 1 % GEL Apply 2 g topically 3 (three) times daily. To neck, bilateral shoulders, and right hand (thumb area).   06/17/2023   diltiazem (CARDIZEM) 120 MG tablet Take 120 mg by mouth 3 (three) times daily.   06/17/2023   ELIQUIS 5 MG TABS tablet Take 1 tablet (5 mg total) by mouth 2 (two) times daily. 180 tablet 3 06/17/2023 at 2158   eszopiclone (LUNESTA) 2 MG TABS tablet Take 1 tablet (2 mg total) by mouth at bedtime. 30 tablet 0 06/17/2023   fentaNYL (DURAGESIC) 25 MCG/HR Place 1 patch onto the skin every 3 (three) days. 10 patch 0 06/15/2023   FLUoxetine (PROZAC) 20 MG capsule Take 20 mg by mouth daily. Give with 40 mg to equal 60   06/17/2023   FLUoxetine (PROZAC) 40 MG capsule Take 40 mg by mouth daily. Give with 20 mg to equal 60   06/17/2023   furosemide (LASIX) 40 MG tablet Take 40 mg by mouth daily.   06/17/2023   gabapentin (NEURONTIN) 300 MG capsule Take 300 mg by mouth 3 (three) times daily.   06/17/2023   lidocaine 4 % Place 1 patch onto the skin in the morning and at bedtime.   06/17/2023   loratadine (CLARITIN) 10 MG tablet Take 10 mg by mouth as needed for itching.   05/30/2023   methocarbamol (ROBAXIN) 500 MG tablet Take 500 mg by mouth every 6 (six) hours as needed for muscle spasms.   06/12/2023   miconazole (MICOTIN) 2 %  powder Apply 1 Application topically as directed. Apply a small amount to affected area every shift change as needed.   Unk   nitroGLYCERIN (NITROSTAT) 0.4 MG SL tablet Place 1 tablet (0.4 mg total) under the tongue every 5 (five) minutes as needed for chest pain (up to 3 doses). 25 tablet 3 Unk   omeprazole (PRILOSEC) 20 MG capsule Take 20 mg by mouth 2 (two) times daily before a meal.  06/17/2023   ondansetron (ZOFRAN-ODT) 4 MG disintegrating tablet Take 4 mg by mouth every 6 (six) hours as needed for nausea or vomiting.   02/17/2023   potassium chloride (MICRO-K) 10 MEQ CR capsule Take 20 mEq by mouth daily.   06/17/2023   rOPINIRole (REQUIP) 0.5 MG tablet Take 0.5 mg by mouth in the morning and at bedtime.   06/17/2023   Simethicone Extra Strength 125 MG CAPS Take 1 capsule by mouth every 6 (six) hours as needed (Gas).   Unk   traMADol (ULTRAM) 50 MG tablet Take 0.5 tablets (25 mg total) by mouth every 8 (eight) hours. 45 tablet 0 06/17/2023   DOXYCYCLINE HYCLATE PO Take 100 mg by mouth in the morning and at bedtime. (Patient not taking: Reported on 06/18/2023)   Completed Course    Assessment: Pharmacy consulted to dose heparin in patient with atrial fibrillation.  Patient is on Eliquis prior to admission with last dose 6/27 @ 2200.  Will need to monitor aPTT until correlation with heparin levels.  6/29 AM update:  aPTT therapeutic at 89  Goal of Therapy:  Heparin level 0.3-0.7 units/ml aPTT 66-102 seconds Monitor platelets by anticoagulation protocol: Yes   Plan:  Cont heparin 1100 units/hr aPTT and heparin level with AM labs  Abran Duke, PharmD, BCPS Clinical Pharmacist Phone: [phone]

## 2023-06-19 NOTE — Progress Notes (Addendum)
ANTICOAGULATION CONSULT NOTE   Pharmacy Consult for heparin Indication: atrial fibrillation  Allergies  Allergen Reactions   Buprenorphine Hcl Itching   Cymbalta [Duloxetine Hcl] Other (See Comments)    Other reaction(s): Other (See Comments) Personality changes - crying  2014   Morphine And Codeine Itching   Oxycodone-Acetaminophen Other (See Comments)    Personality changes    Valium [Diazepam] Other (See Comments)    Personality changes - "in another world" ;  Hallucinations  2015   Statins Other (See Comments)    Other reaction(s): Other (See Comments) Muscle weakness Muscle weakness   Morphine Itching   Altace [Ramipril] Other (See Comments)    unknown   Floxin [Ofloxacin] Other (See Comments)    unknown   Hydromorphone Other (See Comments)    Other reaction(s): Confusion (intolerance) unknown   Lovaza [Omega-3-Acid Ethyl Esters] Other (See Comments)    Muscle weakness    Trilipix [Choline Fenofibrate] Other (See Comments)    Muscle weakness    Zetia [Ezetimibe] Other (See Comments)    Muscle weakness     Patient Measurements: Weight: 83.5 kg (184 lb 1.4 oz) Heparin Dosing Weight: 80 kg  Vital Signs: Temp: 98.5 F (36.9 C) (06/29 1701) Temp Source: Oral (06/29 1701) BP: 145/65 (06/29 1737) Pulse Rate: 52 (06/29 1737)  Labs: Recent Labs    06/18/23 1144 06/18/23 2349 06/19/23 0442 06/19/23 1717  HGB 11.6*  --  11.1*  --   HCT 36.3  --  35.3*  --   PLT 171  --  166  --   APTT  --  89* 115* 73*  HEPARINUNFRC  --  >1.10* >1.10*  --   CREATININE 0.50  --  0.59  --      Estimated Creatinine Clearance: 59.2 mL/min (by C-G formula based on SCr of 0.59 mg/dL).   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 difficile colitis 07/21/2022   CAD (coronary artery disease)    a. Abnl nuc 11/2014. Cath 12/2014 - turned down for CABG. Ultimately s/p TTVP, rotational atherectomy, PTCA and stenting of the ostial LCx and left main into the LAD (crush technique), and IVUS of the LAD/Left main. // b. Myoview 11/17: EF 48, poor quality/significant artifact; inf-lateral, inferior ischemia; Intermediate Risk   Chronic atrial fibrillation (HCC)    a.  First noted post-op 9/15 spinal fusion.  She had cardioversion, not on anticoagulation. Fall risk, unsteady. // failed DCCV // Holter 10/17: AFib, Avg HR 97, PVCs, no other arrhythmia   Chronic back pain greater than 3 months duration    a. spinal stenosis.  Spinal fusion with rods in 2/15 at [facility] spinal fusion 9/15.   Chronic diastolic CHF    Echo 2/17:  EF 50-55%, trivial AI, midl MR, mod LAE, PASP 37 mmHg // Echocardiogram 03/2020: EF 45-50, no RWMA, mild LVH, mild reduced RVSF, mildly elevated PASP (RVSP 38.3), mild LAE, mild MR, mild to mod TR, trivial AI // Echo 9/21: EF 45, mild LVH, mildly reduced RV SF, moderate LAE, mild RAE, mild MR, mild AI      Circadian rhythm sleep disorder    CTS (carpal tunnel syndrome)    Deficiency anemia 11/10/2014  Depression    Diarrhea    Diverticulitis of colon    Esophageal stricture    Gastritis    Gastroesophageal hernia 07/06/2013   GERD (gastroesophageal reflux disease)    Hiatal hernia    History of blood transfusion    "most of them related to OR's"    HTN (hypertension)    Hyperlipidemia    IBS (irritable bowel syndrome)    Incontinence 10/13/2012   Insomnia    Ischemic chest pain (HCC)    Memory disorder 12/04/2014   Metabolic syndrome 04/24/2014   MGUS (monoclonal gammopathy of unknown significance) dx'd 11/2014   a. Neg BMB 11/2014.   Multiple falls    Obesity    Obstructive sleep apnea    "have mask; don't wear it" (01/07/2015)   Orthostasis    PAT  (paroxysmal atrial tachycardia)    Personal history of colonic polyps 10/25/2011 & 12/02/11   not retrieved Dr Victorino Dike & tubular adenomas   Pneumonia 03/2014   Sinus bradycardia    a. Baseline HR 50s-60s.   Stroke [facility]) early 2000's   "small"; denies residual on 01/07/2015)    Medications:  Medications Prior to Admission  Medication Sig Dispense Refill Last Dose   acetaminophen (TYLENOL) 325 MG tablet Take 650 mg by mouth 3 (three) times daily.   06/17/2023   aspirin EC 81 MG tablet Take 81 mg by mouth daily. Swallow whole.   06/17/2023   busPIRone (BUSPAR) 10 MG tablet Take 10 mg by mouth 3 (three) times daily.   06/17/2023   clobetasol (TEMOVATE) 0.05 % external solution Apply 1 application. topically 2 (two) times a week. Monday and Friday   06/14/2023   diclofenac Sodium (VOLTAREN) 1 % GEL Apply 2 g topically 3 (three) times daily. To neck, bilateral shoulders, and right hand (thumb area).   06/17/2023   diltiazem (CARDIZEM) 120 MG tablet Take 120 mg by mouth 3 (three) times daily.   06/17/2023   ELIQUIS 5 MG TABS tablet Take 1 tablet (5 mg total) by mouth 2 (two) times daily. 180 tablet 3 06/17/2023 at 2158   eszopiclone (LUNESTA) 2 MG TABS tablet Take 1 tablet (2 mg total) by mouth at bedtime. 30 tablet 0 06/17/2023   fentaNYL (DURAGESIC) 25 MCG/HR Place 1 patch onto the skin every 3 (three) days. 10 patch 0 06/15/2023   FLUoxetine (PROZAC) 20 MG capsule Take 20 mg by mouth daily. Give with 40 mg to equal 60   06/17/2023   FLUoxetine (PROZAC) 40 MG capsule Take 40 mg by mouth daily. Give with 20 mg to equal 60   06/17/2023   furosemide (LASIX) 40 MG tablet Take 40 mg by mouth daily.   06/17/2023   gabapentin (NEURONTIN) 300 MG capsule Take 300 mg by mouth 3 (three) times daily.   06/17/2023   lidocaine 4 % Place 1 patch onto the skin in the morning and at bedtime.   06/17/2023   loratadine (CLARITIN) 10 MG tablet Take 10 mg by mouth as needed for itching.   05/30/2023   methocarbamol (ROBAXIN)  500 MG tablet Take 500 mg by mouth every 6 (six) hours as needed for muscle spasms.   06/12/2023   miconazole (MICOTIN) 2 % powder Apply 1 Application topically as directed. Apply a small amount to affected area every shift change as needed.   Unk   nitroGLYCERIN (NITROSTAT) 0.4 MG SL tablet Place 1 tablet (0.4 mg total) under the tongue every 5 (five) minutes as needed for  chest pain (up to 3 doses). 25 tablet 3 Unk   omeprazole (PRILOSEC) 20 MG capsule Take 20 mg by mouth 2 (two) times daily before a meal.   06/17/2023   ondansetron (ZOFRAN-ODT) 4 MG disintegrating tablet Take 4 mg by mouth every 6 (six) hours as needed for nausea or vomiting.   02/17/2023   potassium chloride (MICRO-K) 10 MEQ CR capsule Take 20 mEq by mouth daily.   06/17/2023   rOPINIRole (REQUIP) 0.5 MG tablet Take 0.5 mg by mouth in the morning and at bedtime.   06/17/2023   Simethicone Extra Strength 125 MG CAPS Take 1 capsule by mouth every 6 (six) hours as needed (Gas).   Unk   traMADol (ULTRAM) 50 MG tablet Take 0.5 tablets (25 mg total) by mouth every 8 (eight) hours. 45 tablet 0 06/17/2023   DOXYCYCLINE HYCLATE PO Take 100 mg by mouth in the morning and at bedtime. (Patient not taking: Reported on 06/18/2023)   Completed Course    Assessment: Pharmacy consulted to dose heparin in patient with atrial fibrillation.  Patient is on Eliquis prior to admission with last dose 6/27 @ 2200.  Will need to monitor aPTT until correlation with heparin levels.  aPTT 73 (therapeutic) on heparin 950 units/hr. No signs of bleeding reported.  Goal of Therapy:  Heparin level 0.3-0.7 units/ml aPTT 66-102 seconds Monitor platelets by anticoagulation protocol: Yes   Plan:  Slightly increase to heparin 1000 units/hr given decline with last change Check aPTT with AM labs Daily aPTT/heparin level/CBC F/u transition to PO   Eldridge Scot, PharmD Clinical Pharmacist 06/19/2023 6:06 PM

## 2023-06-19 NOTE — Progress Notes (Signed)
ANTICOAGULATION CONSULT NOTE -   Pharmacy Consult for heparin Indication: atrial fibrillation  Allergies  Allergen Reactions   Buprenorphine Hcl Itching   Cymbalta [Duloxetine Hcl] Other (See Comments)    Other reaction(s): Other (See Comments) Personality changes - crying  2014   Morphine And Codeine Itching   Oxycodone-Acetaminophen Other (See Comments)    Personality changes    Valium [Diazepam] Other (See Comments)    Personality changes - "in another world" ;  Hallucinations  2015   Statins Other (See Comments)    Other reaction(s): Other (See Comments) Muscle weakness Muscle weakness   Morphine Itching   Altace [Ramipril] Other (See Comments)    unknown   Floxin [Ofloxacin] Other (See Comments)    unknown   Hydromorphone Other (See Comments)    Other reaction(s): Confusion (intolerance) unknown   Lovaza [Omega-3-Acid Ethyl Esters] Other (See Comments)    Muscle weakness    Trilipix [Choline Fenofibrate] Other (See Comments)    Muscle weakness    Zetia [Ezetimibe] Other (See Comments)    Muscle weakness     Patient Measurements: Weight: 83.5 kg (184 lb 1.4 oz) Heparin Dosing Weight: 80 kg  Vital Signs: Temp: 98.2 F (36.8 C) (06/29 0750) Temp Source: Axillary (06/29 0750) BP: 170/134 (06/29 0700) Pulse Rate: 113 (06/29 0700)  Labs: Recent Labs    06/18/23 1144 06/18/23 2349 06/19/23 0442  HGB 11.6*  --  11.1*  HCT 36.3  --  35.3*  PLT 171  --  166  APTT  --  89* 115*  HEPARINUNFRC  --  >1.10* >1.10*  CREATININE 0.50  --  0.59     Estimated Creatinine Clearance: 59.2 mL/min (by C-G formula based on SCr of 0.59 mg/dL).   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  difficile colitis 07/21/2022   CAD (coronary artery disease)    a. Abnl nuc 11/2014. Cath 12/2014 - turned down for CABG. Ultimately s/p TTVP, rotational atherectomy, PTCA and stenting of the ostial LCx and left main into the LAD (crush technique), and IVUS of the LAD/Left main. // b. Myoview 11/17: EF 48, poor quality/significant artifact; inf-lateral, inferior ischemia; Intermediate Risk   Chronic atrial fibrillation (HCC)    a.  First noted post-op 9/15 spinal fusion.  She had cardioversion, not on anticoagulation. Fall risk, unsteady. // failed DCCV // Holter 10/17: AFib, Avg HR 97, PVCs, no other arrhythmia   Chronic back pain greater than 3 months duration    a. spinal stenosis.  Spinal fusion with rods in 2/15 at [facility] spinal fusion 9/15.   Chronic diastolic CHF    Echo 2/17:  EF 50-55%, trivial AI, midl MR, mod LAE, PASP 37 mmHg // Echocardiogram 03/2020: EF 45-50, no RWMA, mild LVH, mild reduced RVSF, mildly elevated PASP (RVSP 38.3), mild LAE, mild MR, mild to mod TR, trivial AI // Echo 9/21: EF 45, mild LVH, mildly reduced RV SF, moderate LAE, mild RAE, mild MR, mild AI      Circadian rhythm sleep disorder    CTS (carpal tunnel syndrome)    Deficiency anemia 11/10/2014   Depression    Diarrhea    Diverticulitis of colon    Esophageal stricture  Gastritis    Gastroesophageal hernia 07/06/2013   GERD (gastroesophageal reflux disease)    Hiatal hernia    History of blood transfusion    "most of them related to OR's"    HTN (hypertension)    Hyperlipidemia    IBS (irritable bowel syndrome)    Incontinence 10/13/2012   Insomnia    Ischemic chest pain (HCC)    Memory disorder 12/04/2014   Metabolic syndrome 04/24/2014   MGUS (monoclonal gammopathy of unknown significance) dx'd 11/2014   a. Neg BMB 11/2014.   Multiple falls    Obesity    Obstructive sleep apnea    "have mask; don't wear it" (01/07/2015)   Orthostasis    PAT (paroxysmal atrial tachycardia)     Personal history of colonic polyps 10/25/2011 & 12/02/11   not retrieved Dr Victorino Dike & tubular adenomas   Pneumonia 03/2014   Sinus bradycardia    a. Baseline HR 50s-60s.   Stroke [facility]) early 2000's   "small"; denies residual on 01/07/2015)    Medications:  Medications Prior to Admission  Medication Sig Dispense Refill Last Dose   acetaminophen (TYLENOL) 325 MG tablet Take 650 mg by mouth 3 (three) times daily.   06/17/2023   aspirin EC 81 MG tablet Take 81 mg by mouth daily. Swallow whole.   06/17/2023   busPIRone (BUSPAR) 10 MG tablet Take 10 mg by mouth 3 (three) times daily.   06/17/2023   clobetasol (TEMOVATE) 0.05 % external solution Apply 1 application. topically 2 (two) times a week. Monday and Friday   06/14/2023   diclofenac Sodium (VOLTAREN) 1 % GEL Apply 2 g topically 3 (three) times daily. To neck, bilateral shoulders, and right hand (thumb area).   06/17/2023   diltiazem (CARDIZEM) 120 MG tablet Take 120 mg by mouth 3 (three) times daily.   06/17/2023   ELIQUIS 5 MG TABS tablet Take 1 tablet (5 mg total) by mouth 2 (two) times daily. 180 tablet 3 06/17/2023 at 2158   eszopiclone (LUNESTA) 2 MG TABS tablet Take 1 tablet (2 mg total) by mouth at bedtime. 30 tablet 0 06/17/2023   fentaNYL (DURAGESIC) 25 MCG/HR Place 1 patch onto the skin every 3 (three) days. 10 patch 0 06/15/2023   FLUoxetine (PROZAC) 20 MG capsule Take 20 mg by mouth daily. Give with 40 mg to equal 60   06/17/2023   FLUoxetine (PROZAC) 40 MG capsule Take 40 mg by mouth daily. Give with 20 mg to equal 60   06/17/2023   furosemide (LASIX) 40 MG tablet Take 40 mg by mouth daily.   06/17/2023   gabapentin (NEURONTIN) 300 MG capsule Take 300 mg by mouth 3 (three) times daily.   06/17/2023   lidocaine 4 % Place 1 patch onto the skin in the morning and at bedtime.   06/17/2023   loratadine (CLARITIN) 10 MG tablet Take 10 mg by mouth as needed for itching.   05/30/2023   methocarbamol (ROBAXIN) 500 MG tablet Take 500 mg by mouth  every 6 (six) hours as needed for muscle spasms.   06/12/2023   miconazole (MICOTIN) 2 % powder Apply 1 Application topically as directed. Apply a small amount to affected area every shift change as needed.   Unk   nitroGLYCERIN (NITROSTAT) 0.4 MG SL tablet Place 1 tablet (0.4 mg total) under the tongue every 5 (five) minutes as needed for chest pain (up to 3 doses). 25 tablet 3 Unk   omeprazole (PRILOSEC) 20 MG capsule Take 20  mg by mouth 2 (two) times daily before a meal.   06/17/2023   ondansetron (ZOFRAN-ODT) 4 MG disintegrating tablet Take 4 mg by mouth every 6 (six) hours as needed for nausea or vomiting.   02/17/2023   potassium chloride (MICRO-K) 10 MEQ CR capsule Take 20 mEq by mouth daily.   06/17/2023   rOPINIRole (REQUIP) 0.5 MG tablet Take 0.5 mg by mouth in the morning and at bedtime.   06/17/2023   Simethicone Extra Strength 125 MG CAPS Take 1 capsule by mouth every 6 (six) hours as needed (Gas).   Unk   traMADol (ULTRAM) 50 MG tablet Take 0.5 tablets (25 mg total) by mouth every 8 (eight) hours. 45 tablet 0 06/17/2023   DOXYCYCLINE HYCLATE PO Take 100 mg by mouth in the morning and at bedtime. (Patient not taking: Reported on 06/18/2023)   Completed Course    Assessment: Pharmacy consulted to dose heparin in patient with atrial fibrillation.  Patient is on Eliquis prior to admission with last dose 6/27 @ 2200.  Will need to monitor aPTT until correlation with heparin levels.  HL >1.10- still elevated from apixaban APTT: 115- supratherapeutic   Goal of Therapy:  Heparin level 0.3-0.7 units/ml aPTT 66-102 seconds Monitor platelets by anticoagulation protocol: Yes   Plan:  Decrease heparin infusion to 950 units/hr Check aPTT in 8 hours and heparin level daily.   Judeth Cornfield, PharmD Clinical Pharmacist 06/19/2023 8:52 AM

## 2023-06-19 NOTE — Progress Notes (Addendum)
PROGRESS NOTE  [name] [name] ZOX:[id number] DOB: 02-09-1941 DOA: 06/18/2023 PCP: Sharee Holster, NP  Brief History:  82 year old female with a history of chronic back pain status post fusion, chronic lower extremity edema, coronary artery disease, HFmrEF (50-55%), permanent atrial fibrillation, hypertension, hyperlipidemia, restless leg syndrome, esophageal stricture, paraesophageal hernia status postsurgical repair, depression/anxiety presenting with increasing difficulty swallowing over the past month, worsening over the past 2 days.  He was also been complaining of lower abdominal pain.  Notably, the patient had a paraesophageal hernia repair, Heller myotomy, and gastropexy at [facility] on 02/17/22.  The patient had a complex postoperative history.  She began experiencing dysphagia again at the end of March 2023.  She was readmitted back to [facility] from 03/17/2022 to 04/13/2022.  She had a prolonged hospitalization which was complicated by development of atrial fibrillation with RVR and aspiration pneumonia.  She required intubation because of respiratory failure from her aspiration.  Subsequently, she underwent EGD on 03/27/2022 which showed severe postoperative esophageal narrowing at the GE junction dilated with a 10 to 12 mm CRE dilatation.  There is food noted in the stomach with obstruction views of the cardia.  After the EGD, the patient experienced pharyngoesophageal deficits impacting her swallowing.  After goals of care discussion with the patient's family they elected to proceed with a gastrostomy tube placement which was performed on 04/07/2022.  She was started on enteral feedings which she tolerated and ultimately discharged to a skilled nursing facility on 04/13/2022.   Thereafter, pt was ultimate evaluated by ENT and underwent FEES on 06/16/22 which suggested pt could begin liquid diet.  She continued to follow up with speech therapy with serial MBS studies and her diet was gradually  advanced.  Gastrostomy tube was removed in December 2023, and the patient has been eating by mouth since that period of time.  However over the next several months, she has noted increasing dysphagia type symptoms.  This has been worsening despite crushing her pills and eating softer food.  She has had to basically tolerate what amounts to a full liquid and soft diet.  However in the past few days prior to admission she has had increasing regurgitation, hypersalivation, and dry heaving.  She has had difficulty tolerating her pills.  She has had increasing shortness of breath over the past few days.  She denies any frank chest pain, fevers, chills, diarrhea, hematochezia, melena.  She has had some urinary frequency but denies any frank dysuria.  There is no hematemesis. Because she has had increasing difficulty tolerating pills she has not been able to take her cardiac medications or pain medications. In the ED, the patient was afebrile, but tachycardic in the 140s.  She was hemodynamically stable.  Oxygen saturation was 87% on 2 L.  She was placed on nasal cannula.  WBC 6.5, hemoglobin 9.6, platelets 171.  Sodium 131, potassium 3.7, bicarbonate 24, serum creatinine 0.50.  AST 216, ALT 210, alk phos 50-50, total bili 0.8.  EKG showed atrial fibrillation with RVR.  There is no ST-T wave changes.  CT chest and abdomen showed multiple airspace opacities bilaterally consistent with multifocal pneumonia.  There is moderate renal atrophy.  There is pancreatic atrophy.  There is a periumbilical hernia.  Stomach was within normal limits.  There is no bowel obstruction.  She was started on diltiazem drip, ceftriaxone, azithromycin.   Assessment/Plan: Acute respiratory failure with hypoxia -Secondary to aspiration pneumonia -Stable on  2 L nasal cannula -Presented with tachypnea and hypoxia   Aspiration pneumonia -continue zosyn -Speech therapy evaluation   Dysphagia -complex hx with paraesophageal hernia  repair 02/17/22--see above discussion -GI consult appreciated>>plan for barium esophagram 7/1 or 7/2 -Clear liquids for now if able to tolerate   Permanent atrial fibrillation with RVR -Echocardiogram--delayed due to continue tachycardia -Continue diltiazem drip -TSH--0.937 -Continue IV heparin until until patient is able to tolerate pills   Chronic pain syndrome/chronic back pain/opioid dependence -She is on a fentanyl patch, 25 mcg chronically>>increase to 50 mcg -She is on tramadol for breakthrough pain at SNF -prn IV fentanyl until able to swallow -Continue gabapentin if able to swallow   Chronic HFmrEF -Appears clinically euvolemic -10/27/2021 echo EF 50-55%, no WMA, trivial MR -Holding furosemide temporarily   Restless leg syndrome -Restart ropinirole when patient able to tolerate p.o.   GERD -Pantoprazole twice daily IV   Depression/anxiety -Restart fluoxetine 60 mg daily when able to tolerate p.o. -Restart BuSpar once able to tolerate p.o. -pt refuses po intake.  After discussion with daughter>>agrees to try valium IV   Hyponatremia -Secondary to poor solute intake and volume depletion -Judicious IV normal saline        Family Communication:   daughter at bedside 6/29  Consultants:  GI  Code Status:  DNR  DVT Prophylaxis:  IV Heparin    Procedures: As Listed in Progress Note Above  Antibiotics: Zosyn 6/28>>      Subjective: Pt complains of back pain, abd pain.  Denies f/c, cp, sob, n/vd  Objective: Vitals:   06/19/23 0400 06/19/23 0700 06/19/23 0750 06/19/23 1100  BP:  (!) 170/134    Pulse:  (!) 113    Resp:  18    Temp: 98.2 F (36.8 C)  98.2 F (36.8 C) 98 F (36.7 C)  TempSrc: Oral  Axillary Oral  SpO2:  98%    Weight: 83.5 kg       Intake/Output Summary (Last 24 hours) at 06/19/2023 1308 Last data filed at 06/19/2023 0809 Gross per 24 hour  Intake 2962.97 ml  Output --  Net 2962.97 ml   Weight change:  Exam:  General:   Pt is alert, follows commands appropriately, not in acute distress HEENT: No icterus, No thrush, No neck mass, [place]/AT Cardiovascular: IRRR, S1/S2, no rubs, no gallops Respiratory: bilateral rhonchi Abdomen: Soft/+BS, non tender, non distended, no guarding Extremities: No edema, No lymphangitis, No petechiae, No rashes, no synovitis   Data Reviewed: I have personally reviewed following labs and imaging studies Basic Metabolic Panel: Recent Labs  Lab 06/18/23 1144 06/19/23 0442  NA 131* 134*  K 3.7 3.6  CL 97* 102  CO2 24 23  GLUCOSE 130* 89  BUN 10 11  CREATININE 0.50 0.59  CALCIUM 8.3* 8.2*   Liver Function Tests: Recent Labs  Lab 06/18/23 1144 06/19/23 0442  AST 216* 109*  ALT 210* 142*  ALKPHOS 250* 194*  BILITOT 0.8 0.6  PROT 6.9 6.5  ALBUMIN 3.4* 3.0*   No results for input(s): "LIPASE", "AMYLASE" in the last 168 hours. No results for input(s): "AMMONIA" in the last 168 hours. Coagulation Profile: No results for input(s): "INR", "PROTIME" in the last 168 hours. CBC: Recent Labs  Lab 06/15/23 0000 06/18/23 1144 06/19/23 0442  WBC 6.5 6.5 6.1  NEUTROABS 4.0 5.0  --   HGB 11.5 11.6* 11.1*  HCT 35.5 36.3 35.3*  MCV 85 87.5 89.1  PLT 199 171 166   Cardiac Enzymes: No  results for input(s): "CKTOTAL", "CKMB", "CKMBINDEX", "TROPONINI" in the last 168 hours. BNP: Invalid input(s): "POCBNP" CBG: No results for input(s): "GLUCAP" in the last 168 hours. HbA1C: No results for input(s): "HGBA1C" in the last 72 hours. Urine analysis:    Component Value Date/Time   COLORURINE YELLOW 07/14/2022 1133   APPEARANCEUR TURBID (A) 07/14/2022 1133   APPEARANCEUR Clear 06/21/2018 1248   LABSPEC 1.012 07/14/2022 1133   PHURINE 8.0 07/14/2022 1133   GLUCOSEU NEGATIVE 07/14/2022 1133   HGBUR SMALL (A) 07/14/2022 1133   BILIRUBINUR NEGATIVE 07/14/2022 1133   BILIRUBINUR Negative 06/21/2018 1248   KETONESUR NEGATIVE 07/14/2022 1133   PROTEINUR 30 (A) 07/14/2022 1133    UROBILINOGEN 0.2 10/27/2014 0729   NITRITE POSITIVE (A) 07/14/2022 1133   LEUKOCYTESUR LARGE (A) 07/14/2022 1133   Sepsis Labs: @LABRCNTIP (procalcitonin:4,lacticidven:4) ) Recent Results (from the past 240 hour(s))  Blood culture (routine x 2)     Status: None (Preliminary result)   Collection Time: 06/18/23  2:39 PM   Specimen: BLOOD  Result Value Ref Range Status   Specimen Description BLOOD BLOOD LEFT ARM  Final   Special Requests   Final    BOTTLES DRAWN AEROBIC AND ANAEROBIC Blood Culture adequate volume   Culture   Final    NO GROWTH < 24 HOURS Performed at [facility], [address] [place], [place] [address]    Report Status PENDING  Incomplete  Blood culture (routine x 2)     Status: None (Preliminary result)   Collection Time: 06/18/23  2:44 PM   Specimen: BLOOD  Result Value Ref Range Status   Specimen Description BLOOD BLOOD LEFT HAND  Final   Special Requests   Final    BOTTLES DRAWN AEROBIC AND ANAEROBIC Blood Culture adequate volume   Culture   Final    NO GROWTH < 24 HOURS Performed at [facility], [address] [place], [place] [address]    Report Status PENDING  Incomplete     Scheduled Meds:  busPIRone  10 mg Oral TID   Chlorhexidine Gluconate Cloth  6 each Topical Daily   fentaNYL  1 patch Transdermal Q72H   gabapentin  300 mg Oral TID   lidocaine  1 patch Transdermal Q24H   pantoprazole (PROTONIX) IV  40 mg Intravenous Q12H   rOPINIRole  0.5 mg Oral QHS   traMADol  50 mg Oral Once   Continuous Infusions:  0.9 % NaCl with KCl 20 mEq / L 75 mL/hr at 06/18/23 1740   diltiazem (CARDIZEM) infusion 5 mg/hr (06/19/23 0329)   heparin 950 Units/hr (06/19/23 0941)   piperacillin-tazobactam (ZOSYN)  IV 3.375 g (06/19/23 0942)    Procedures/Studies: US Abdomen Limited RUQ (LIVER/GB)  Result Date: 06/18/2023 CLINICAL DATA:  Elevated transaminase. EXAM: ULTRASOUND ABDOMEN LIMITED RIGHT UPPER QUADRANT COMPARISON:  Ultrasound June 2018.  CT scan earlier  06/18/2023 FINDINGS: Gallbladder: Previous surgical removal. Common bile duct: Diameter: 8 mm. Within normal limits for the patient's age in the post cholecystectomy state. This measured 10 mm in 2018. Liver: Homogeneous hepatic parenchyma. Mild intrahepatic biliary duct ectasia. Portal vein is patent on color Doppler imaging with normal direction of blood flow towards the liver. Other: Study limited by overlapping bowel gas and soft tissue. IMPRESSION: Previous cholecystectomy. Stable prominent common duct and some mild intrahepatic biliary duct ectasia. Study limited by overlapping bowel gas and soft tissue. Electronically Signed   By: Karen Kays M.D.   On: 06/18/2023 17:25   CT CHEST ABDOMEN PELVIS WO  CONTRAST  Result Date: 06/18/2023 CLINICAL DATA:  Dysphagia, acute generalized abdominal pain. EXAM: CT CHEST, ABDOMEN AND PELVIS WITHOUT CONTRAST TECHNIQUE: Multidetector CT imaging of the chest, abdomen and pelvis was performed following the standard protocol without IV contrast. RADIATION DOSE REDUCTION: This exam was performed according to the departmental dose-optimization program which includes automated exposure control, adjustment of the mA and/or kV according to patient size and/or use of iterative reconstruction technique. COMPARISON:  March 17, 2022. FINDINGS: CT CHEST FINDINGS Cardiovascular: Atherosclerosis of thoracic aorta is noted without aneurysm formation. Mild cardiomegaly. No pericardial effusion. Coronary artery calcifications are noted. Mediastinum/Nodes: No enlarged mediastinal, hilar, or axillary lymph nodes. Thyroid gland, trachea, and esophagus demonstrate no significant findings. Lungs/Pleura: No pneumothorax or pleural effusion is noted. Multiple patchy airspace opacities are noted bilaterally consistent with multifocal pneumonia. Mild left posterior basilar subsegmental atelectasis is noted. Musculoskeletal: Surgical posterior fusion is seen involving the thoracic and lumbar spine.  No acute osseous abnormality is noted. CT ABDOMEN PELVIS FINDINGS Hepatobiliary: No focal liver abnormality is seen. Status post cholecystectomy. No biliary dilatation. Pancreas: Pancreatic atrophy is again noted. No acute abnormality seen. Spleen: Normal in size without focal abnormality. Adrenals/Urinary Tract: Adrenal glands appear normal. Mild right renal atrophy is noted. No renal or ureteral calculi are noted. No definite hydronephrosis or renal obstruction is noted. Urinary bladder is unremarkable. Stomach/Bowel: Stomach is within normal limits. Appendix appears normal. No evidence of bowel wall thickening, distention, or inflammatory changes. Vascular/Lymphatic: Aortic atherosclerosis. No enlarged abdominal or pelvic lymph nodes. Reproductive: Uterus and bilateral adnexa are unremarkable. Other: Small fat containing periumbilical hernia. No ascites is noted. Musculoskeletal: Status post surgical posterior fusion of lumbar spine as well. No acute osseous abnormality is noted. IMPRESSION: Multiple airspace opacities are noted bilaterally consistent with multifocal pneumonia. Mild left posterior basilar subsegmental atelectasis is noted. Mild right renal atrophy is again noted. Pancreatic atrophy is again noted. Small fat containing periumbilical hernia. Status post surgical posterior fusion involving thoracic and lumbar spine. Aortic Atherosclerosis (ICD10-I70.0). Electronically Signed   By: Lupita Raider M.D.   On: 06/18/2023 12:57   DG Chest Port 1 View  Result Date: 06/18/2023 CLINICAL DATA:  82 year old female with shortness of breath, abdominal pain and difficulty swallowing for 1 week. EXAM: PORTABLE CHEST 1 VIEW COMPARISON:  Chest radiographs 02/23/2023 and earlier. FINDINGS: Portable AP upright view at 1220 hours. Stable lung volumes and mediastinal contours. Chronic eventration of the right hemidiaphragm is stable. Improved left lung base ventilation since March. But there remains blunting of  the left lateral costophrenic angle, and streaky retrocardiac opacity. No superimposed pneumothorax or pulmonary edema. No other confluent opacity. Extensive posterior spinal fusion hardware redemonstrated, grossly stable. Stable cholecystectomy clips. Paucity of bowel gas in the visible upper abdomen. IMPRESSION: Improved left lung base ventilation since March, but evidence of a small left pleural effusion and residual streaky, nonspecific retrocardiac opacity. Electronically Signed   By: Odessa Fleming M.D.   On: 06/18/2023 12:29    Catarina Hartshorn, DO  [facility]  If 7PM-7AM, please contact night-coverage [email or web address] Password [email or web address] 06/19/2023, 1:08 PM   LOS: 1 day

## 2023-06-19 NOTE — Progress Notes (Signed)
Patient increasingly anxious and agitated this AM.  Insisting on getting up in chair, which was heavy 2 assist with RW to stand/pivot.   States that IV fentanyl no longer effective for pain control.  Patient has been without PO anxiety and antidepressant medication for approximately four days as of this AM.   MD notified. Continuing to monitor for safety at this time

## 2023-06-19 NOTE — Progress Notes (Signed)
Heart rate too high for accurate echo at this time. 

## 2023-06-19 NOTE — Plan of Care (Signed)
  Problem: Education: Goal: Knowledge of General Education information will improve Description: Including pain rating scale, medication(s)/side effects and non-pharmacologic comfort measures Outcome: Progressing   Problem: Clinical Measurements: Goal: Ability to maintain clinical measurements within normal limits will improve Outcome: Progressing Goal: Diagnostic test results will improve Outcome: Progressing Goal: Cardiovascular complication will be avoided Outcome: Progressing   Problem: Activity: Goal: Risk for activity intolerance will decrease Outcome: Progressing   Problem: Elimination: Goal: Will not experience complications related to urinary retention Outcome: Progressing   Problem: Pain Managment: Goal: General experience of comfort will improve Outcome: Progressing   Problem: Safety: Goal: Ability to remain free from injury will improve Outcome: Progressing   Problem: Skin Integrity: Goal: Risk for impaired skin integrity will decrease Outcome: Progressing   Problem: Health Behavior/Discharge Planning: Goal: Ability to manage health-related needs will improve Outcome: Not Progressing   Problem: Clinical Measurements: Goal: Will remain free from infection Outcome: Not Progressing Goal: Respiratory complications will improve Outcome: Not Progressing   Problem: Nutrition: Goal: Adequate nutrition will be maintained Outcome: Not Progressing   Problem: Coping: Goal: Level of anxiety will decrease Outcome: Not Progressing   Problem: Elimination: Goal: Will not experience complications related to bowel motility Outcome: Not Progressing

## 2023-06-20 DIAGNOSIS — R1310 Dysphagia, unspecified: Secondary | ICD-10-CM | POA: Diagnosis not present

## 2023-06-20 DIAGNOSIS — J9601 Acute respiratory failure with hypoxia: Secondary | ICD-10-CM | POA: Diagnosis not present

## 2023-06-20 DIAGNOSIS — I4821 Permanent atrial fibrillation: Secondary | ICD-10-CM | POA: Diagnosis not present

## 2023-06-20 DIAGNOSIS — J690 Pneumonitis due to inhalation of food and vomit: Secondary | ICD-10-CM | POA: Diagnosis not present

## 2023-06-20 LAB — COMPREHENSIVE METABOLIC PANEL
ALT: 92 U/L — ABNORMAL HIGH (ref 0–44)
AST: 50 U/L — ABNORMAL HIGH (ref 15–41)
Albumin: 2.8 g/dL — ABNORMAL LOW (ref 3.5–5.0)
Alkaline Phosphatase: 158 U/L — ABNORMAL HIGH (ref 38–126)
Anion gap: 7 (ref 5–15)
BUN: 11 mg/dL (ref 8–23)
CO2: 24 mmol/L (ref 22–32)
Calcium: 8.1 mg/dL — ABNORMAL LOW (ref 8.9–10.3)
Chloride: 104 mmol/L (ref 98–111)
Creatinine, Ser: 0.53 mg/dL (ref 0.44–1.00)
GFR, Estimated: >60 mL/min (ref >60)
Glucose, Bld: 80 mg/dL (ref 70–99)
Potassium: 3.5 mmol/L (ref 3.5–5.1)
Sodium: 135 mmol/L (ref 135–145)
Total Bilirubin: 0.8 mg/dL (ref 0.3–1.2)
Total Protein: 6 g/dL — ABNORMAL LOW (ref 6.5–8.1)

## 2023-06-20 LAB — CBC
HCT: 35.6 % — ABNORMAL LOW (ref 36.0–46.0)
Hemoglobin: 10.8 g/dL — ABNORMAL LOW (ref 12.0–15.0)
MCH: 27.7 pg (ref 26.0–34.0)
MCHC: 30.3 g/dL (ref 30.0–36.0)
MCV: 91.3 fL (ref 80.0–100.0)
Platelets: 148 10*3/uL — ABNORMAL LOW (ref 150–400)
RBC: 3.9 MIL/uL (ref 3.87–5.11)
RDW: 15.3 % (ref 11.5–15.5)
WBC: 5 10*3/uL (ref 4.0–10.5)
nRBC: 0 % (ref 0.0–0.2)

## 2023-06-20 LAB — CULTURE, BLOOD (ROUTINE X 2)
Culture: NO GROWTH
Special Requests: ADEQUATE

## 2023-06-20 LAB — APTT: aPTT: 73 seconds — ABNORMAL HIGH (ref 24–36)

## 2023-06-20 LAB — HEPARIN LEVEL (UNFRACTIONATED): Heparin Unfractionated: 0.81 IU/mL — ABNORMAL HIGH (ref 0.30–0.70)

## 2023-06-20 NOTE — Progress Notes (Signed)
Patient stable overnight.  Bedside swallowing evaluation performed this morning reveals persistent with significant oropharyngeal dysfunction.  Patient fearful of swallowing.  I discussed at length with Havery Moros  Vital signs in last 24 hours: Temp:  [97.8 F (36.6 C)-98.5 F (36.9 C)] 98.2 F (36.8 C) (06/30 0806) Pulse Rate:  [52-93] 72 (06/30 0500) Resp:  [9-19] 10 (06/30 0500) BP: (113-161)/(50-101) 113/55 (06/30 0500) SpO2:  [95 %-100 %] 98 % (06/30 0500) Last BM Date : 06/17/23 General:   Fretful, anxious elderly lady who appears otherwise no acute distress.  Intake/Output from previous day: 06/29 0701 - 06/30 0700 In: 454.1 [I.V.:352.3; IV Piggyback:101.8] Out: 525 [Urine:525] Intake/Output this shift: Total I/O In: 1467.1 [I.V.:1375.9; IV Piggyback:91.2] Out: -   Lab Results: Recent Labs    06/18/23 1144 06/19/23 0442 06/20/23 0533  WBC 6.5 6.1 5.0  HGB 11.6* 11.1* 10.8*  HCT 36.3 35.3* 35.6*  PLT 171 166 148*   BMET Recent Labs    06/18/23 1144 06/19/23 0442 06/20/23 0533  NA 131* 134* 135  K 3.7 3.6 3.5  CL 97* 102 104  CO2 24 23 24   GLUCOSE 130* 89 80  BUN 10 11 11   CREATININE 0.50 0.59 0.53  CALCIUM 8.3* 8.2* 8.1*   LFT Recent Labs    06/20/23 0533  PROT 6.0*  ALBUMIN 2.8*  AST 50*  ALT 92*  ALKPHOS 158*  BILITOT 0.8   PT/INR No results for input(s): "LABPROT", "INR" in the last 72 hours. Hepatitis Panel Recent Labs    06/18/23 1741  HEPBSAG NON REACTIVE  HCVAB NON REACTIVE  HEPAIGM NON REACTIVE  HEPBIGM NON REACTIVE   C-Diff No results for input(s): "CDIFFTOX" in the last 72 hours.  Studies/Results: US Abdomen Limited RUQ (LIVER/GB)  Result Date: 06/18/2023 CLINICAL DATA:  Elevated transaminase. EXAM: ULTRASOUND ABDOMEN LIMITED RIGHT UPPER QUADRANT COMPARISON:  Ultrasound June 2018.  CT scan earlier 06/18/2023 FINDINGS: Gallbladder: Previous surgical removal. Common bile duct: Diameter: 8 mm. Within normal limits for the  patient's age in the post cholecystectomy state. This measured 10 mm in 2018. Liver: Homogeneous hepatic parenchyma. Mild intrahepatic biliary duct ectasia. Portal vein is patent on color Doppler imaging with normal direction of blood flow towards the liver. Other: Study limited by overlapping bowel gas and soft tissue. IMPRESSION: Previous cholecystectomy. Stable prominent common duct and some mild intrahepatic biliary duct ectasia. Study limited by overlapping bowel gas and soft tissue. Electronically Signed   By: Karen Kays M.D.   On: 06/18/2023 17:25   CT CHEST ABDOMEN PELVIS WO CONTRAST  Result Date: 06/18/2023 CLINICAL DATA:  Dysphagia, acute generalized abdominal pain. EXAM: CT CHEST, ABDOMEN AND PELVIS WITHOUT CONTRAST TECHNIQUE: Multidetector CT imaging of the chest, abdomen and pelvis was performed following the standard protocol without IV contrast. RADIATION DOSE REDUCTION: This exam was performed according to the departmental dose-optimization program which includes automated exposure control, adjustment of the mA and/or kV according to patient size and/or use of iterative reconstruction technique. COMPARISON:  March 17, 2022. FINDINGS: CT CHEST FINDINGS Cardiovascular: Atherosclerosis of thoracic aorta is noted without aneurysm formation. Mild cardiomegaly. No pericardial effusion. Coronary artery calcifications are noted. Mediastinum/Nodes: No enlarged mediastinal, hilar, or axillary lymph nodes. Thyroid gland, trachea, and esophagus demonstrate no significant findings. Lungs/Pleura: No pneumothorax or pleural effusion is noted. Multiple patchy airspace opacities are noted bilaterally consistent with multifocal pneumonia. Mild left posterior basilar subsegmental atelectasis is noted. Musculoskeletal: Surgical posterior fusion is seen involving the thoracic and lumbar spine. No acute  osseous abnormality is noted. CT ABDOMEN PELVIS FINDINGS Hepatobiliary: No focal liver abnormality is seen. Status  post cholecystectomy. No biliary dilatation. Pancreas: Pancreatic atrophy is again noted. No acute abnormality seen. Spleen: Normal in size without focal abnormality. Adrenals/Urinary Tract: Adrenal glands appear normal. Mild right renal atrophy is noted. No renal or ureteral calculi are noted. No definite hydronephrosis or renal obstruction is noted. Urinary bladder is unremarkable. Stomach/Bowel: Stomach is within normal limits. Appendix appears normal. No evidence of bowel wall thickening, distention, or inflammatory changes. Vascular/Lymphatic: Aortic atherosclerosis. No enlarged abdominal or pelvic lymph nodes. Reproductive: Uterus and bilateral adnexa are unremarkable. Other: Small fat containing periumbilical hernia. No ascites is noted. Musculoskeletal: Status post surgical posterior fusion of lumbar spine as well. No acute osseous abnormality is noted. IMPRESSION: Multiple airspace opacities are noted bilaterally consistent with multifocal pneumonia. Mild left posterior basilar subsegmental atelectasis is noted. Mild right renal atrophy is again noted. Pancreatic atrophy is again noted. Small fat containing periumbilical hernia. Status post surgical posterior fusion involving thoracic and lumbar spine. Aortic Atherosclerosis (ICD10-I70.0). Electronically Signed   By: Lupita Raider M.D.   On: 06/18/2023 12:57   DG Chest Port 1 View  Result Date: 06/18/2023 CLINICAL DATA:  82 year old female with shortness of breath, abdominal pain and difficulty swallowing for 1 week. EXAM: PORTABLE CHEST 1 VIEW COMPARISON:  Chest radiographs 02/23/2023 and earlier. FINDINGS: Portable AP upright view at 1220 hours. Stable lung volumes and mediastinal contours. Chronic eventration of the right hemidiaphragm is stable. Improved left lung base ventilation since March. But there remains blunting of the left lateral costophrenic angle, and streaky retrocardiac opacity. No superimposed pneumothorax or pulmonary edema. No  other confluent opacity. Extensive posterior spinal fusion hardware redemonstrated, grossly stable. Stable cholecystectomy clips. Paucity of bowel gas in the visible upper abdomen. IMPRESSION: Improved left lung base ventilation since March, but evidence of a small left pleural effusion and residual streaky, nonspecific retrocardiac opacity. Electronically Signed   By: Odessa Fleming M.D.   On: 06/18/2023 12:29     Impression:81 yo female with chronic back pain, chronic lower extremity edema, CAD, permanent A-fib on Eliquis, chronic HFmrEF, hypertension, Cdiff, Stroke, MGUS, hyperlipidemia, restless leg syndrome, esophageal stricture, paraesophageal hernia status post postsurgical repair, depression/anxiety presenting with increasing difficulty swallowing over the past month, worse over the past 2 days.  Complaining of lower abdominal pain. Noted to have multifocal PNA suspected aspiration PNA, elevated LFTs. GI consulted for dysphagia.  In February 2023 she underwent paraesophageal hernia repair, Heller myotomy, gastropexy at [facility].  Postoperative dysphagia.  EGD April 2023 confirmed Severe postoperative esophageal narrowing and GEJ dilated with 10 to 12 mm CRE dilation.   Further evaluation suggested eat GE junction outflow obstruction versus achalasia. G-tube placed but subsequently removed.  Now with recurrent esophageal dysphagia and aspiration pneumonia.  Updated bedside swallowing evaluation today reveals ongoing significant oropharyngeal dysfunction.  At risk for aspiration.  Elevated LFTs.  Nonspecific.  Right upper quadrant ultrasound centrally negative.  Recommendations:  As suggested by speech pathology will keep her n.p.o. for now.  Suspect her dysphagia symptoms are multifactorial in etiology i.e. both oropharyngeal and esophageal.  Given recent significant surgical intervention and recurrent esophageal dysphagia.,  I feel it would be prudent to proceed with her  dysphagia evaluation with a barium pill esophagram/upper GI series. If further evaluation needed i.e. endoscopy/sedation.  Will need to discuss at length with anesthesia.  Her comorbidities maybe beyond the scope of anesthesia capabilities at  our institution.  Continue to hold Eliquis for now.  Trend LFTs.  Further recommendations to follow.

## 2023-06-20 NOTE — Evaluation (Addendum)
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 difficile colitis 07/21/2022   CAD (coronary artery disease)    a. Abnl nuc 11/2014. Cath 12/2014 - turned down for CABG. Ultimately s/p TTVP, rotational atherectomy, PTCA and stenting of the ostial LCx and left main into the LAD (crush technique), and IVUS of the LAD/Left main. // b. Myoview 11/17: EF 48, poor quality/significant artifact; inf-lateral, inferior ischemia; Intermediate Risk   Chronic atrial fibrillation (HCC)    a.  First noted post-op 9/15 spinal fusion.  She had cardioversion, not on anticoagulation. Fall risk, unsteady. // failed DCCV // Holter 10/17: AFib, Avg HR 97, PVCs, no other arrhythmia   Chronic back pain greater than 3 months duration    a. spinal stenosis.  Spinal fusion with rods in 2/15 at [facility] spinal fusion 9/15.   Chronic diastolic CHF    Echo 2/17:  EF 50-55%, trivial AI, midl MR, mod LAE, PASP 37 mmHg // Echocardiogram 03/2020: EF 45-50, no RWMA, mild LVH, mild reduced RVSF, mildly elevated PASP (RVSP 38.3), mild LAE, mild MR, mild to mod TR, trivial AI // Echo 9/21: EF 45, mild LVH, mildly reduced RV SF, moderate LAE, mild RAE, mild MR, mild AI      Circadian rhythm sleep disorder    CTS (carpal tunnel syndrome)    Deficiency anemia 11/10/2014   Depression    Diarrhea    Diverticulitis of colon    Esophageal stricture    Gastritis    Gastroesophageal hernia  07/06/2013   GERD (gastroesophageal reflux disease)    Hiatal hernia    History of blood transfusion    "most of them related to OR's"    HTN (hypertension)    Hyperlipidemia    IBS (irritable bowel syndrome)    Incontinence 10/13/2012   Insomnia    Ischemic chest pain (HCC)    Memory disorder 12/04/2014   Metabolic syndrome 04/24/2014   MGUS (monoclonal gammopathy of unknown significance) dx'd 11/2014   a. Neg BMB 11/2014.   Multiple falls    Obesity    Obstructive sleep apnea    "have mask; don't wear it" (01/07/2015)   Orthostasis    PAT (paroxysmal atrial tachycardia)    Personal history of colonic polyps 10/25/2011 & 12/02/11   not retrieved Dr Victorino Dike & tubular adenomas   Pneumonia 03/2014   Sinus bradycardia    a. Baseline HR 50s-60s.   Stroke [facility]) early 2000's   "small"; denies residual on 01/07/2015)   Past Surgical History:  Past Surgical History:  Procedure Laterality Date   BACK SURGERY     BONE MARROW BIOPSY  11/2014   CARDIAC CATHETERIZATION  12/27/2014   Procedure: INTRAVASCULAR PRESSURE WIRE/FFR STUDY;  Surgeon: Laurey Morale, MD;  Location: [facility] CATH LAB;  Service: Cardiovascular;;   CARDIAC CATHETERIZATION N/A 11/25/2016   Procedure: Left Heart Cath and Coronary Angiography;  Surgeon: Tonny Bollman, MD;  Location: [facility] INVASIVE CV LAB;  Service: Cardiovascular;  Laterality: N/A;   CARDIOVERSION N/A 02/13/2016   Procedure: CARDIOVERSION;  Surgeon: Wendall Stade, MD;  Location: [facility] ENDOSCOPY;  Service: Cardiovascular;  Laterality: N/A;   CARPAL TUNNEL RELEASE Right 1980's   CATARACT EXTRACTION Bilateral    CATARACT EXTRACTION W/ INTRAOCULAR LENS  IMPLANT, BILATERAL  2000's   CORONARY ANGIOPLASTY WITH STENT PLACEMENT  01/07/2015   "2"   CORONARY STENT PLACEMENT     DILATION AND CURETTAGE OF UTERUS     ESOPHAGOGASTRODUODENOSCOPY (EGD) WITH ESOPHAGEAL DILATION  "several times"   IR GASTROSTOMY TUBE REMOVAL  12/23/2022   IR REPLC GASTRO/COLONIC TUBE PERCUT  W/FLUORO  09/08/2022   JOINT REPLACEMENT     KNEE ARTHROSCOPY Left 1995   LAPAROSCOPIC CHOLECYSTECTOMY  2003   LEFT HEART CATHETERIZATION WITH CORONARY ANGIOGRAM N/A 12/27/2014   Procedure: LEFT HEART CATHETERIZATION WITH CORONARY ANGIOGRAM;  Surgeon: Laurey Morale, MD;  Location: [facility] CATH LAB;  Service: Cardiovascular;  Laterality: N/A;   PERCUTANEOUS CORONARY ROTOBLATOR INTERVENTION (PCI-R)  01/07/2015   PERCUTANEOUS CORONARY ROTOBLATOR INTERVENTION (PCI-R) N/A 01/07/2015   Procedure: PERCUTANEOUS CORONARY ROTOBLATOR INTERVENTION (PCI-R);  Surgeon: Micheline Chapman, MD;  Location: [facility] CATH LAB;  Service: Cardiovascular;  Laterality: N/A;   POSTERIOR FUSION THORACIC SPINE  08/2015   POSTERIOR LUMBAR FUSION  01/2015   TOTAL KNEE ARTHROPLASTY Bilateral 1990's - 2000's   HPI:  82 year old female with a history of chronic back pain status post fusion, chronic lower extremity edema, coronary artery disease, HFmrEF (50-55%), permanent atrial fibrillation, hypertension, hyperlipidemia, restless leg syndrome, esophageal stricture, paraesophageal hernia status postsurgical repair, depression/anxiety presenting with increasing difficulty swallowing over the past month, worsening over the past 2 days.  He was also been complaining of lower abdominal pain. Notably, the patient had a paraesophageal hernia repair, Heller myotomy, and gastropexy at [facility] on 02/17/22.  The patient had a complex postoperative history. See dysphagia history below. Pt is known to SLP service from previous dysphagia history (esophageal and pharyngeal). Her last MBSS was completed 09/2022 and PEG was removed 12/2022. Pt now with increased signs of pharyngoesophageal dysphagia and BSE requested. GI is also following and considering BPE this week.   Dysphagia History:  <<06/16/22 FEES: Impressions: Patient presents with moderate pharyngeal dysphagia impacted by age-related changes and deconditioning. Dysphagia is characterized by delayed swallow  initiation, reduced bolus propulsion, incomplete airway closure, and seemingly decreased UES distention. Deficits result in frequent thin/nectar-thick liquid penetration, transient penetration over interarytenoid space across consistencies, and trace ranging to severe pharyngeal residue that worsened with viscosity and reduced with multiple effortful swallows and/or liquid wash. No aspiration upon thorough challenging. Patient was educated on risks and adverse outcomes associated with dysphagia/aspiration and reviewed the relative cost/benefits of management options: nonoral nutrition/NPO vs nonoral nutrition/oral intake for comfort vs full oral diet and thin vs nectar-thick liquids. Patient reported interest in drinking thin liquids. Following discussion, recommend patient initiate a full thin liquid diet with adherence to aspiration precautions below and continue g-tube use to promote stable nutrition. Recommend short term follow-up via MBS to better characterize UES distention/other biomechanics during the swallow and assess effectiveness of strategies. Discussed swallowing therapy though plan to re-evaluate following MBS. Patient and family member verbalized understanding of all exam results and recommendations. Exam results discussed with Dr. Delford Field following the study. Will fax report to facility.  Recommendations: Diet: full thin liquid diet, continue g-tube use Aspiration Precautions: sit upright, slow rate, take small sips, use effortful swallow, swallow at least 2-3x per bolus, cough/clear  throat intermittently during meal Administer Medications: via alternate means Further Assessment Via: Pharyngeal Function Study (PFS) Other: evaluation with facility dietician regarding  02/18/2022 FEES: "Patient presents with mild-moderate pharyngeal and pharyngoesophageal dysphagia, likely multiphase dysphagia. Penetration/aspiration scores slightly worsened since she completed MBS in 2020. She is acutely post-op  from paraesophageal hernia repair and heller myotomy, appearing deconditioned and requiring oxygen via nasal cannula. Pharyngeal phase is characterized by decreased laryngeal closure resulting in stagnant penetration of thin liquid and puree, and suspected decreased UES distension (from CP bar and post-cricoid impression) resulting in mild post-swallow residuals with thin liquid and puree. Penetration was not cleared with completion of the swallow, but did clear with post-swallow throat clearing (both independently initiated and cued by SLP). No aspiration observed. One instance of mild regurgitation into the hypopharynx observed that was cleared with immediate, reflexive swallowing. No post-prandial penetration or aspiration observed. Patient refused pill capsule trial. Based on this evaluation, recommend Full Liquid diet (max diet allowed per Bariatric Surgery) with strict aspiration precautions, medications crushed in puree or liquid form. Recommend swallow therapy to address pharyngeal swallow deficits."  03/31/2022 MBS: "Patient presents with moderate-severe oropharyngeal dysphagia with significant hx of esophageal deficits. Patient's extensive GI history and esophageal deficits impacted bolus efficiency and ability to trial additional consistencies. Additionally, patient demonstrated anxiety regarding today's exam and benefited from positive encouragement to participate with study. The oral phase was characterized by delayed lingual transit and brief oral holding, most likely due to patient's anxiety regarding swallowing. This resulted in a prolonged oral phase. Pharyngeal initiation is timely. Pharyngeal phase is characterized by incomplete laryngeal vestibule closure, reduced epiglottic inversion, reduced base of tongue retraction, diminished pharyngeal stripping wave, and reduced UES opening. Incomplete laryngeal vestibule closure resulted in stagnant penetration during trials of thin, nectar, and puree  trials. Patient intermittently demonstrated a reflexive throat clear/cough and was able to partially clear penetration from vestibule. Reduced epiglottic inversion in combination with reduced base of tongue retraction and diminished pharyngeal stripping wave resulted in mild to moderate residuals in the valleculae and at the base of tongue. Patient's known esophageal deficits in combination with reduced UES opening resulted in moderate to severe residuals in the pyriforms and UES area. Residuals increased with increased viscosity and larger bolus sizes (when able to elicit). Patient unable to clear these residuals and often demonstrated regurgitation into pharyngeal and oral cavity. Patient self suctioned oral cavity with Yan[place]ur when regurgitation occurred. Large sips were trialed, however ineffective in clearing residuals and patient often self-limited bolus amount. Honey thick liquids, solids, and pill trials were deferred during study d/t patient's deficits. Swallowing safety is moderately impaired. Swallow efficiency is severely impacted by pharyngoesophageal deficits. Recommend to continue no oral diet with alternative means of nutrition, hydration, and medication. Patient able to consume ice chips and sips of water in moderation and after good and thorough oral care. Consider a dietician consult to ensure adequate nutrition met. Will continue to monitor and will discuss with medical team plan of care regarding additional swallow evaluations. " 03/27/22 EGD: "hx of hx of CAD (s/p PCI 2016), Afib w/ hx cardioversion in Eliquis, OSA, DM, hx CCY (2006), and laparoscopic Heller myotomy with paraesophageal hernia repair and gastropexy (02/17/22), knee surgery, posterior spinal fusion, who presented with persistent dysphagia and findings of an esophageal fluid collection.    EGD 2018 for dysphagia: very tortuous esophagus, large hiatal hernia, large inflammatory polyp in the stomach biopsied (path hyperplastic  polyp)    Esophagram 10/2021: Absent/weak and  incomplete primary wave. Extensive tertiary contractions predominantly in the distal esophagus. This pattern can be seen with diffuse esophageal spasm and with type 3 achalasia. Mild delay in barium tablet passage through the distal esophagus, likely related to dysmotility. No evidence of significant mass or stricture.    EGD 02/12/22: Large hernia that is creating esophagogastric junction outflow obstruction. Endoflip w/ low DI. Gastric biopsies w/ mild chronic inactive gastritis. 200 u Botox injected    Manometry 02/12/22: Findings of EGJOO, achalasia cannot be excluded    02/17/22: laparoscopic Heller myotomy with paraesophageal hernia repair and gastropexy, discharged 02/27/22. Did better for a week but w/ progressive/persistent dysphagia, inability to tolerate PO    Presented to [place] 03/17/22 w/ dysphagia.    CT chest/abd/pelvis 03/17/22: 1. Narrowing of the distal esophagus/gastroesophageal junction, with contrast medium in the mildly dilated esophagus leading up to this point. Along the distal esophageal margin on the left side there is a 120 cc rounded complex collection at the site of the prior hiatal hernia along the left lower chest, which could be from local hematoma, abscess, or less likely some sort of herniated dilated fundoplication wrap or diverticulum. This does not seem to fill with the adjacent dense contrast from the esophagus and there is no internal gas within this collection, does not appear in continuity with the esophagus lumen    Esophagram 03/18/22: There is a 1.8 cm segment of narrowing of the distal esophagus measuring 3 to 4 mm in diameter at its thinnest. This could be related to extrinsic compression by the previous noted mediastinal collection. The distal esophagus proximal to the aforementioned narrowing is patulous with retained food debris.Marland KitchenMarland KitchenImpression  1. Sever post operative esophageal narrowing at GEJ dilated with  10-106mm CRE dilation 2. Food in the stomach obstructed views of cardia/stomach Recommendation 1. Patient will need Dobhoff tube replacement (pulled out in recovery) 2. Management of post-op esophageal narrowing per MIS team" 05/27/22 Dr. Hillery Jacks (General Surgery): "presents for abdominal pain. She states she is not taking anything by mouth but wants to try ice chips. She was recently taken to the ED for a clogged G-tube which was successfully unclogged. She would like to see if she can swallow now.Marland KitchenMarland KitchenMyrtlene Servatius Finnie is a 82 y.o. female who presents for follow up. She would like to see if she could swallow and if any of her symptoms have resolved. We will have her meet with the ENT team for a FEES to determine if is safe to resume swallowing anything...40 minute discussion of issues related to pharyngeal and esophageal phase swallowing disorder. She has gained weight and looks physically well in comparison to post op and inpatient. It is reasonable to test her pharyngeal function, she is handling her secretions. It is unclear if her stasis/esophageal achalasia picture will allow her to eat, but the pharyngeal assessment would need to come first. I saw and evaluated the patient and reviewed the resident's note. I agree with the resident's findings  MBSS 09/21/2022 at [facility] as outpatient: Pt presents with min/mod pharyngeal phase dysphagia and suspected primary esophageal phase dysphagia characterized by min decreased tongue base retraction and epiglottic deflection (with appearance of osteophytes along cervial spine which likely negatively impact epiglottic deflection), and reduced relaxation of the UES/prominent cricopharyngeus resulting in ocasional flash penetration of thins, min vallecular residue with puree/solids, and moderate pyriform residue post swallow (greater with puree and solids; less with liquids). Esophageal sweep revealed barium filled esophagus in the distal two thirds of the  esophagus with some retrograde movement. Recommend regular textures vs mechanical soft per treating SLP at SNF and thin liquids via cup/straw, alternate solids and liquids, implement effortful/hard swallow with solids, and Pt to sit upright for all eating and drinking and 30+ minutes after (avoid bending over as well). Pt will likely need to proceed with solid foods cautiously to see if she experiences and regurgitation. Recommend f/u with SLP services at SNF. Ok for PO medication whole with water. >>   Assessment / Plan / Recommendation  Clinical Impression  Pt with longstanding pharyngoesophageal dysphagia and known to SLP service from [facility] completed as an outpatient 09/2022 with recommendation for regular/thin pending esophageal tolerance. Pt currently appears with guarding behaviors and fearful of swallowing during our assessment. She has excess oral secretions suspect due to oral holding and fear of swallowing. She has been using oral suction frequently. Oral motor exam is essentially WNL with missing dentition. She required encouragement to take ice chips, small sips of water, and straw sips of water. Pt initially let the water spill from her lips and stated that it would not go down. SLP encouraged Pt to purse lips together and swallow hard for 2-3x and clear throat and repeat swallow. She presented with immediate and delayed throat clearing and some coughing. Pt fearful to take anything else. Suspect Pt having a hard time passing PO through UES (this was noted in MBSS 09/2022), which likely results in minimal clearance and residuals in pyriforms. Acknowledge that GI is following and considering barium swallow vs EGD tomorrow or next day. Given complex dysphagia history with multifactorial influences, continue liquids only and SLP will continue to follow- can try to complete MBSS depending on what GI wants to do. For now, continue to provide good oral care and offer ice chips and sips of water and  encourage Pt to swallow 2-3x for each sip and adhere to reflux precautions of sitting upright for all PO attempts. Above to RN. SLP Visit Diagnosis: Dysphagia, pharyngoesophageal phase (R13.14)    Aspiration Risk  Moderate aspiration risk;Risk for inadequate nutrition/hydration    Diet Recommendation Thin liquid (OK to continue liquids)    Liquid Administration via: Cup;Straw Medication Administration: Via alternative means Supervision: Staff to assist with self feeding;Full supervision/cueing for compensatory strategies Compensations: Slow rate;Small sips/bites;Multiple dry swallows after each bite/sip;Clear throat intermittently Postural Changes: Seated upright at 90 degrees;Remain upright for at least 30 minutes after po intake    Other  Recommendations Recommended Consults: Consider esophageal assessment Oral Care Recommendations: Oral care prior to ice chip/H20;Staff/trained caregiver to provide oral care;Oral care before and after PO Caregiver Recommendations: Have oral suction available    Recommendations for follow up therapy are one component of a multi-disciplinary discharge planning process, led by the attending physician.  Recommendations may be updated based on patient status, additional functional criteria and insurance authorization.  Follow up Recommendations Skilled nursing-short term rehab (<3 hours/day)      Assistance Recommended at Discharge    Functional Status Assessment Patient has had a recent decline in their functional status and demonstrates the ability to make significant improvements in function in a reasonable and predictable amount of time.  Frequency and Duration min 2x/week  1 week       Prognosis Prognosis for improved oropharyngeal function: Guarded Barriers to Reach Goals: Severity of deficits;Time post onset;Behavior      Swallow Study   General Date of Onset: 06/18/23 HPI: 82 year old female with a history of chronic back pain status post  fusion, chronic lower extremity edema, coronary artery disease, HFmrEF (50-55%), permanent atrial fibrillation, hypertension, hyperlipidemia, restless leg syndrome, esophageal stricture, paraesophageal hernia status postsurgical repair, depression/anxiety presenting with increasing difficulty swallowing over the past month, worsening over the past 2 days.  He was also been complaining of lower abdominal pain. Notably, the patient had a paraesophageal hernia repair, Heller myotomy, and gastropexy at [facility] on 02/17/22.  The patient had a complex postoperative history. See dysphagia history below. Pt is known to SLP service from previous dysphagia history (esophageal and pharyngeal). Her last MBSS was completed 09/2022 and PEG was removed 12/2022. Pt now with increased signs of pharyngoesophageal dysphagia and BSE requested. GI is also following and considering BPE this week. Type of Study: Bedside Swallow Evaluation Previous Swallow Assessment: MBSS 09/2022 (primary esophageal, pharyngeal/UES) Diet Prior to this Study: Clear liquid diet Temperature Spikes Noted: No Respiratory Status: Room air History of Recent Intubation: No Behavior/Cognition: Alert;Cooperative;Pleasant mood;Requires cueing Oral Cavity Assessment: Excessive secretions Oral Care Completed by SLP: Yes Oral Cavity - Dentition: Poor condition Vision: Functional for self-feeding Self-Feeding Abilities: Needs assist Patient Positioning: Upright in bed Baseline Vocal Quality: Low vocal intensity Volitional Cough: Congested;Weak Volitional Swallow: Able to elicit    Oral/Motor/Sensory Function Overall Oral Motor/Sensory Function: Within functional limits   Ice Chips Ice chips: Impaired Presentation: Spoon Oral Phase Impairments: Reduced lingual movement/coordination Pharyngeal Phase Impairments: Cough - Delayed   Thin Liquid Thin Liquid: Impaired Presentation: Cup;Spoon;Straw Oral Phase Impairments: Reduced labial seal Pharyngeal   Phase Impairments: Cough - Delayed    Nectar Thick Nectar Thick Liquid: Not tested   Honey Thick Honey Thick Liquid: Not tested   Puree Puree: Not tested   Solid     Solid: Not tested     Thank you,  Havery Moros, CCC-SLP [phone]  Alfretta Pinch 06/20/2023,9:41 AM

## 2023-06-20 NOTE — Progress Notes (Signed)
ANTICOAGULATION CONSULT NOTE   Pharmacy Consult for heparin Indication: atrial fibrillation  Allergies  Allergen Reactions   Buprenorphine Hcl Itching   Cymbalta [Duloxetine Hcl] Other (See Comments)    Other reaction(s): Other (See Comments) Personality changes - crying  2014   Morphine And Codeine Itching   Oxycodone-Acetaminophen Other (See Comments)    Personality changes    Valium [Diazepam] Other (See Comments)    Personality changes - "in another world" ;  Hallucinations  2015   Statins Other (See Comments)    Other reaction(s): Other (See Comments) Muscle weakness Muscle weakness   Morphine Itching   Altace [Ramipril] Other (See Comments)    unknown   Floxin [Ofloxacin] Other (See Comments)    unknown   Hydromorphone Other (See Comments)    Other reaction(s): Confusion (intolerance) unknown   Lovaza [Omega-3-Acid Ethyl Esters] Other (See Comments)    Muscle weakness    Trilipix [Choline Fenofibrate] Other (See Comments)    Muscle weakness    Zetia [Ezetimibe] Other (See Comments)    Muscle weakness     Patient Measurements: Weight: 83.5 kg (184 lb 1.4 oz) Heparin Dosing Weight: 80 kg  Vital Signs: Temp: 97.8 F (36.6 C) (06/30 0400) Temp Source: Oral (06/30 0400) BP: 113/55 (06/30 0500) Pulse Rate: 72 (06/30 0500)  Labs: Recent Labs    06/18/23 1144 06/18/23 1144 06/18/23 2349 06/19/23 0442 06/19/23 1717 06/20/23 0533  HGB 11.6*  --   --  11.1*  --  10.8*  HCT 36.3  --   --  35.3*  --  35.6*  PLT 171  --   --  166  --  148*  APTT  --    < > 89* 115* 73* 73*  HEPARINUNFRC  --   --  >1.10* >1.10*  --  0.81*  CREATININE 0.50  --   --  0.59  --  0.53   < > = values in this interval not displayed.     Estimated Creatinine Clearance: 59.2 mL/min (by C-G formula based on SCr of 0.53 mg/dL).   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 difficile colitis 07/21/2022   CAD (coronary artery disease)    a. Abnl nuc 11/2014. Cath 12/2014 - turned down for CABG. Ultimately s/p TTVP, rotational atherectomy, PTCA and stenting of the ostial LCx and left main into the LAD (crush technique), and IVUS of the LAD/Left main. // b. Myoview 11/17: EF 48, poor quality/significant artifact; inf-lateral, inferior ischemia; Intermediate Risk   Chronic atrial fibrillation (HCC)    a.  First noted post-op 9/15 spinal fusion.  She had cardioversion, not on anticoagulation. Fall risk, unsteady. // failed DCCV // Holter 10/17: AFib, Avg HR 97, PVCs, no other arrhythmia   Chronic back pain greater than 3 months duration    a. spinal stenosis.  Spinal fusion with rods in 2/15 at [facility] spinal fusion 9/15.   Chronic diastolic CHF    Echo 2/17:  EF 50-55%, trivial AI, midl MR, mod LAE, PASP 37 mmHg // Echocardiogram 03/2020: EF 45-50, no RWMA, mild LVH, mild reduced RVSF, mildly elevated PASP (RVSP 38.3), mild LAE, mild MR, mild to mod TR, trivial AI // Echo  9/21: EF 45, mild LVH, mildly reduced RV SF, moderate LAE, mild RAE, mild MR, mild AI      Circadian rhythm sleep disorder    CTS (carpal tunnel syndrome)    Deficiency anemia 11/10/2014   Depression    Diarrhea    Diverticulitis of colon    Esophageal stricture    Gastritis    Gastroesophageal hernia 07/06/2013   GERD (gastroesophageal reflux disease)    Hiatal hernia    History of blood transfusion    "most of them related to OR's"    HTN (hypertension)    Hyperlipidemia    IBS (irritable bowel syndrome)    Incontinence 10/13/2012   Insomnia    Ischemic chest pain (HCC)    Memory disorder 12/04/2014   Metabolic syndrome 04/24/2014   MGUS (monoclonal gammopathy of unknown significance) dx'd 11/2014   a. Neg  BMB 11/2014.   Multiple falls    Obesity    Obstructive sleep apnea    "have mask; don't wear it" (01/07/2015)   Orthostasis    PAT (paroxysmal atrial tachycardia)    Personal history of colonic polyps 10/25/2011 & 12/02/11   not retrieved Dr Victorino Dike & tubular adenomas   Pneumonia 03/2014   Sinus bradycardia    a. Baseline HR 50s-60s.   Stroke [facility]) early 2000's   "small"; denies residual on 01/07/2015)    Medications:  Medications Prior to Admission  Medication Sig Dispense Refill Last Dose   acetaminophen (TYLENOL) 325 MG tablet Take 650 mg by mouth 3 (three) times daily.   06/17/2023   aspirin EC 81 MG tablet Take 81 mg by mouth daily. Swallow whole.   06/17/2023   busPIRone (BUSPAR) 10 MG tablet Take 10 mg by mouth 3 (three) times daily.   06/17/2023   clobetasol (TEMOVATE) 0.05 % external solution Apply 1 application. topically 2 (two) times a week. Monday and Friday   06/14/2023   diclofenac Sodium (VOLTAREN) 1 % GEL Apply 2 g topically 3 (three) times daily. To neck, bilateral shoulders, and right hand (thumb area).   06/17/2023   diltiazem (CARDIZEM) 120 MG tablet Take 120 mg by mouth 3 (three) times daily.   06/17/2023   ELIQUIS 5 MG TABS tablet Take 1 tablet (5 mg total) by mouth 2 (two) times daily. 180 tablet 3 06/17/2023 at 2158   eszopiclone (LUNESTA) 2 MG TABS tablet Take 1 tablet (2 mg total) by mouth at bedtime. 30 tablet 0 06/17/2023   fentaNYL (DURAGESIC) 25 MCG/HR Place 1 patch onto the skin every 3 (three) days. 10 patch 0 06/15/2023   FLUoxetine (PROZAC) 20 MG capsule Take 20 mg by mouth daily. Give with 40 mg to equal 60   06/17/2023   FLUoxetine (PROZAC) 40 MG capsule Take 40 mg by mouth daily. Give with 20 mg to equal 60   06/17/2023   furosemide (LASIX) 40 MG tablet Take 40 mg by mouth daily.   06/17/2023   gabapentin (NEURONTIN) 300 MG capsule Take 300 mg by mouth 3 (three) times daily.   06/17/2023   lidocaine 4 % Place 1 patch onto the skin in the morning and at  bedtime.   06/17/2023   loratadine (CLARITIN) 10 MG tablet Take 10 mg by mouth as needed for itching.   05/30/2023   methocarbamol (ROBAXIN) 500 MG tablet Take 500 mg by mouth every 6 (six) hours as needed for muscle spasms.   06/12/2023   miconazole (MICOTIN) 2 % powder Apply 1 Application topically  as directed. Apply a small amount to affected area every shift change as needed.   Unk   nitroGLYCERIN (NITROSTAT) 0.4 MG SL tablet Place 1 tablet (0.4 mg total) under the tongue every 5 (five) minutes as needed for chest pain (up to 3 doses). 25 tablet 3 Unk   omeprazole (PRILOSEC) 20 MG capsule Take 20 mg by mouth 2 (two) times daily before a meal.   06/17/2023   ondansetron (ZOFRAN-ODT) 4 MG disintegrating tablet Take 4 mg by mouth every 6 (six) hours as needed for nausea or vomiting.   02/17/2023   potassium chloride (MICRO-K) 10 MEQ CR capsule Take 20 mEq by mouth daily.   06/17/2023   rOPINIRole (REQUIP) 0.5 MG tablet Take 0.5 mg by mouth in the morning and at bedtime.   06/17/2023   Simethicone Extra Strength 125 MG CAPS Take 1 capsule by mouth every 6 (six) hours as needed (Gas).   Unk   traMADol (ULTRAM) 50 MG tablet Take 0.5 tablets (25 mg total) by mouth every 8 (eight) hours. 45 tablet 0 06/17/2023   DOXYCYCLINE HYCLATE PO Take 100 mg by mouth in the morning and at bedtime. (Patient not taking: Reported on 06/18/2023)   Completed Course    Assessment: Pharmacy consulted to dose heparin in patient with atrial fibrillation.  Patient is on Eliquis prior to admission with last dose 6/27 @ 2200.  Will need to monitor aPTT until correlation with heparin levels.  aPTT 73 (therapeutic) on heparin 1000 units/hr. No signs of bleeding reported. HL 0.81- still elevated from apixaban  Goal of Therapy:  Heparin level 0.3-0.7 units/ml aPTT 66-102 seconds Monitor platelets by anticoagulation protocol: Yes   Plan:  Continue heparin at 1000 units/hr  Daily aPTT/heparin level/CBC F/u transition to PO    Judeth Cornfield, PharmD Clinical Pharmacist 06/20/2023 7:38 AM

## 2023-06-20 NOTE — Progress Notes (Addendum)
PROGRESS NOTE  [name] [name] EAV:[id number] DOB: August 11, 1941 DOA: 06/18/2023 PCP: Sharee Holster, NP  Brief History:  82 year old female with a history of chronic back pain status post fusion, chronic lower extremity edema, coronary artery disease, HFmrEF (50-55%), permanent atrial fibrillation, hypertension, hyperlipidemia, restless leg syndrome, esophageal stricture, paraesophageal hernia status postsurgical repair, depression/anxiety presenting with increasing difficulty swallowing over the past month, worsening over the past 2 days.  He was also been complaining of lower abdominal pain.  Notably, the patient had a paraesophageal hernia repair, Heller myotomy, and gastropexy at [facility] on 02/17/22.  The patient had a complex postoperative history.  She began experiencing dysphagia again at the end of March 2023.  She was readmitted back to [facility] from 03/17/2022 to 04/13/2022.  She had a prolonged hospitalization which was complicated by development of atrial fibrillation with RVR and aspiration pneumonia.  She required intubation because of respiratory failure from her aspiration.  Subsequently, she underwent EGD on 03/27/2022 which showed severe postoperative esophageal narrowing at the GE junction dilated with a 10 to 12 mm CRE dilatation.  There is food noted in the stomach with obstruction views of the cardia.  After the EGD, the patient experienced pharyngoesophageal deficits impacting her swallowing.  After goals of care discussion with the patient's family they elected to proceed with a gastrostomy tube placement which was performed on 04/07/2022.  She was started on enteral feedings which she tolerated and ultimately discharged to a skilled nursing facility on 04/13/2022.   Thereafter, pt was ultimate evaluated by ENT and underwent FEES on 06/16/22 which suggested pt could begin liquid diet.  She continued to follow up with speech therapy with serial MBS studies and her diet was gradually  advanced.  Gastrostomy tube was removed in December 2023, and the patient has been eating by mouth since that period of time.  However over the next several months, she has noted increasing dysphagia type symptoms.  This has been worsening despite crushing her pills and eating softer food.  She has had to basically tolerate what amounts to a full liquid and soft diet.  However in the past few days prior to admission she has had increasing regurgitation, hypersalivation, and dry heaving.  She has had difficulty tolerating her pills.  She has had increasing shortness of breath over the past few days.  She denies any frank chest pain, fevers, chills, diarrhea, hematochezia, melena.  She has had some urinary frequency but denies any frank dysuria.  There is no hematemesis. Because she has had increasing difficulty tolerating pills she has not been able to take her cardiac medications or pain medications. In the ED, the patient was afebrile, but tachycardic in the 140s.  She was hemodynamically stable.  Oxygen saturation was 87% on 2 L.  She was placed on nasal cannula.  WBC 6.5, hemoglobin 9.6, platelets 171.  Sodium 131, potassium 3.7, bicarbonate 24, serum creatinine 0.50.  AST 216, ALT 210, alk phos 50-50, total bili 0.8.  EKG showed atrial fibrillation with RVR.  There is no ST-T wave changes.  CT chest and abdomen showed multiple airspace opacities bilaterally consistent with multifocal pneumonia.  There is moderate renal atrophy.  There is pancreatic atrophy.  There is a periumbilical hernia.  Stomach was within normal limits.  There is no bowel obstruction.  She was started on diltiazem drip, ceftriaxone, azithromycin.   Assessment/Plan:  Acute respiratory failure with hypoxia -Secondary to aspiration pneumonia -Stable  on 2 L nasal cannula -Presented with tachypnea and hypoxia   Aspiration pneumonia -continue zosyn -Speech therapy evaluation appreciated>>may have liquids   Dysphagia -complex hx  with paraesophageal hernia repair 02/17/22--see above discussion -GI consult appreciated>>plan for barium esophagram 7/1 or 7/2 -Clear liquids for now if able to tolerate -appreciate speech therapy eval>>may if liquids   Permanent atrial fibrillation with RVR -Echocardiogram--delayed due to continue tachycardia -Continue diltiazem drip>>now rate controlled -TSH--0.937 -Continue IV heparin until until patient is able to tolerate pills   Chronic pain syndrome/chronic back pain/opioid dependence -She is on a fentanyl patch, 25 mcg chronically>>increase to 50 mcg -She is on tramadol for breakthrough pain at SNF -prn IV fentanyl until able to swallow -Continue gabapentin if able to swallow -increased fentanyl to 50 mcg TD patch   Chronic HFmrEF -Appears clinically euvolemic -10/27/2021 echo EF 50-55%, no WMA, trivial MR -Holding furosemide temporarily   Restless leg syndrome -Restart ropinirole when patient able to tolerate p.o.   GERD -Pantoprazole twice daily IV   Depression/anxiety -Restart fluoxetine 60 mg daily when able to tolerate p.o. -Restart BuSpar once able to tolerate p.o. -pt refuses po intake.  After discussion with daughter>>agrees to try valium IV   Hyponatremia -Secondary to poor solute intake and volume depletion -Judicious IV normal saline               Family Communication:   daughter at bedside 6/30   Consultants:  GI   Code Status:  DNR   DVT Prophylaxis:  IV Heparin      Procedures: As Listed in Progress Note Above   Antibiotics: Zosyn 6/28>>       Subjective:   Objective: Vitals:   06/20/23 0806 06/20/23 1100 06/20/23 1400 06/20/23 1619  BP:   (!) 157/75   Pulse:   84 86  Resp:   10 13  Temp: 98.2 F (36.8 C) 98.5 F (36.9 C)  98.7 F (37.1 C)  TempSrc: Oral Oral  Oral  SpO2:   98% 99%  Weight:        Intake/Output Summary (Last 24 hours) at 06/20/2023 1720 Last data filed at 06/20/2023 1633 Gross per 24 hour  Intake  2035.23 ml  Output 825 ml  Net 1210.23 ml   Weight change:  Exam:  General:  Pt is alert, follows commands appropriately, not in acute distress HEENT: No icterus, No thrush, No neck mass, [place]/AT Cardiovascular: RRR, S1/S2, no rubs, no gallops Respiratory: CTA bilaterally, no wheezing, no crackles, no rhonchi Abdomen: Soft/+BS, non tender, non distended, no guarding Extremities: No edema, No lymphangitis, No petechiae, No rashes, no synovitis   Data Reviewed: I have personally reviewed following labs and imaging studies Basic Metabolic Panel: Recent Labs  Lab 06/18/23 1144 06/19/23 0442 06/20/23 0533  NA 131* 134* 135  K 3.7 3.6 3.5  CL 97* 102 104  CO2 24 23 24   GLUCOSE 130* 89 80  BUN 10 11 11   CREATININE 0.50 0.59 0.53  CALCIUM 8.3* 8.2* 8.1*   Liver Function Tests: Recent Labs  Lab 06/18/23 1144 06/19/23 0442 06/20/23 0533  AST 216* 109* 50*  ALT 210* 142* 92*  ALKPHOS 250* 194* 158*  BILITOT 0.8 0.6 0.8  PROT 6.9 6.5 6.0*  ALBUMIN 3.4* 3.0* 2.8*   No results for input(s): "LIPASE", "AMYLASE" in the last 168 hours. No results for input(s): "AMMONIA" in the last 168 hours. Coagulation Profile: No results for input(s): "INR", "PROTIME" in the last 168 hours. CBC: Recent Labs  Lab 06/15/23 0000 06/18/23 1144 06/19/23 0442 06/20/23 0533  WBC 6.5 6.5 6.1 5.0  NEUTROABS 4.0 5.0  --   --   HGB 11.5 11.6* 11.1* 10.8*  HCT 35.5 36.3 35.3* 35.6*  MCV 85 87.5 89.1 91.3  PLT 199 171 166 148*   Cardiac Enzymes: No results for input(s): "CKTOTAL", "CKMB", "CKMBINDEX", "TROPONINI" in the last 168 hours. BNP: Invalid input(s): "POCBNP" CBG: No results for input(s): "GLUCAP" in the last 168 hours. HbA1C: No results for input(s): "HGBA1C" in the last 72 hours. Urine analysis:    Component Value Date/Time   COLORURINE YELLOW 06/19/2023 1845   APPEARANCEUR HAZY (A) 06/19/2023 1845   APPEARANCEUR Clear 06/21/2018 1248   LABSPEC 1.018 06/19/2023 1845    PHURINE 5.0 06/19/2023 1845   GLUCOSEU NEGATIVE 06/19/2023 1845   HGBUR SMALL (A) 06/19/2023 1845   BILIRUBINUR NEGATIVE 06/19/2023 1845   BILIRUBINUR Negative 06/21/2018 1248   KETONESUR 20 (A) 06/19/2023 1845   PROTEINUR 30 (A) 06/19/2023 1845   UROBILINOGEN 0.2 10/27/2014 0729   NITRITE NEGATIVE 06/19/2023 1845   LEUKOCYTESUR NEGATIVE 06/19/2023 1845   Sepsis Labs: @LABRCNTIP (procalcitonin:4,lacticidven:4) ) Recent Results (from the past 240 hour(s))  Blood culture (routine x 2)     Status: None (Preliminary result)   Collection Time: 06/18/23  2:39 PM   Specimen: BLOOD  Result Value Ref Range Status   Specimen Description BLOOD BLOOD LEFT ARM  Final   Special Requests   Final    BOTTLES DRAWN AEROBIC AND ANAEROBIC Blood Culture adequate volume   Culture   Final    NO GROWTH 2 DAYS Performed at [facility], [address], [place], [place] [address]    Report Status PENDING  Incomplete  Blood culture (routine x 2)     Status: None (Preliminary result)   Collection Time: 06/18/23  2:44 PM   Specimen: BLOOD  Result Value Ref Range Status   Specimen Description BLOOD BLOOD LEFT HAND  Final   Special Requests   Final    BOTTLES DRAWN AEROBIC AND ANAEROBIC Blood Culture adequate volume   Culture   Final    NO GROWTH 2 DAYS Performed at [facility], [address], [place], [place] [address]    Report Status PENDING  Incomplete  Resp panel by RT-PCR (RSV, Flu A&B, Covid) Anterior Nasal Swab     Status: None   Collection Time: 06/19/23  1:08 PM   Specimen: Anterior Nasal Swab  Result Value Ref Range Status   SARS Coronavirus 2 by RT PCR NEGATIVE NEGATIVE Final    Comment: (NOTE) SARS-CoV-2 target nucleic acids are NOT DETECTED.  The SARS-CoV-2 RNA is generally detectable in upper respiratory specimens during the acute phase of infection. The lowest concentration of SARS-CoV-2 viral copies this assay can detect is 138 copies/mL. A negative result does not preclude  SARS-Cov-2 infection and should not be used as the sole basis for treatment or other patient management decisions. A negative result may occur with  improper specimen collection/handling, submission of specimen other than nasopharyngeal swab, presence of viral mutation(s) within the areas targeted by this assay, and inadequate number of viral copies(<138 copies/mL). A negative result must be combined with clinical observations, patient history, and epidemiological information. The expected result is Negative.  Fact Sheet for Patients:  BloggerCourse.com  Fact Sheet for Healthcare Providers:  SeriousBroker.it  This test is no t yet approved or cleared by the [place] FDA and  has been authorized for detection and/or diagnosis of SARS-CoV-2  by FDA under an Emergency Use Authorization (EUA). This EUA will remain  in effect (meaning this test can be used) for the duration of the COVID-19 declaration under Section 564(b)(1) of the Act, 21 U.S.C.section 360bbb-3(b)(1), unless the authorization is terminated  or revoked sooner.       Influenza A by PCR NEGATIVE NEGATIVE Final   Influenza B by PCR NEGATIVE NEGATIVE Final    Comment: (NOTE) The Xpert Xpress SARS-CoV-2/FLU/RSV plus assay is intended as an aid in the diagnosis of influenza from Nasopharyngeal swab specimens and should not be used as a sole basis for treatment. Nasal washings and aspirates are unacceptable for Xpert Xpress SARS-CoV-2/FLU/RSV testing.  Fact Sheet for Patients: BloggerCourse.com  Fact Sheet for Healthcare Providers: SeriousBroker.it  This test is not yet approved or cleared by the [place] FDA and has been authorized for detection and/or diagnosis of SARS-CoV-2 by FDA under an Emergency Use Authorization (EUA). This EUA will remain in effect (meaning this test can be used) for the duration of  the COVID-19 declaration under Section 564(b)(1) of the Act, 21 U.S.C. section 360bbb-3(b)(1), unless the authorization is terminated or revoked.     Resp Syncytial Virus by PCR NEGATIVE NEGATIVE Final    Comment: (NOTE) Fact Sheet for Patients: BloggerCourse.com  Fact Sheet for Healthcare Providers: SeriousBroker.it  This test is not yet approved or cleared by the [place] FDA and has been authorized for detection and/or diagnosis of SARS-CoV-2 by FDA under an Emergency Use Authorization (EUA). This EUA will remain in effect (meaning this test can be used) for the duration of the COVID-19 declaration under Section 564(b)(1) of the Act, 21 U.S.C. section 360bbb-3(b)(1), unless the authorization is terminated or revoked.  Performed at [facility], [address] [place], [place] [address]   MRSA Next Gen by PCR, Nasal     Status: None   Collection Time: 06/19/23  1:08 PM   Specimen: Nasal Mucosa; Nasal Swab  Result Value Ref Range Status   MRSA by PCR Next Gen NOT DETECTED NOT DETECTED Final    Comment: (NOTE) The GeneXpert MRSA Assay (FDA approved for NASAL specimens only), is one component of a comprehensive MRSA colonization surveillance program. It is not intended to diagnose MRSA infection nor to guide or monitor treatment for MRSA infections. Test performance is not FDA approved in patients less than 72 years old. Performed at [facility], [address] [place], [place] [address]   Respiratory (~20 pathogens) panel by PCR     Status: Abnormal   Collection Time: 06/19/23  1:08 PM   Specimen: Nasopharyngeal Swab; Respiratory  Result Value Ref Range Status   Adenovirus NOT DETECTED NOT DETECTED Final   Coronavirus 229E NOT DETECTED NOT DETECTED Final    Comment: (NOTE) The Coronavirus on the Respiratory Panel, DOES NOT test for the novel  Coronavirus (2019 nCoV)    Coronavirus HKU1 NOT DETECTED NOT DETECTED  Final   Coronavirus NL63 NOT DETECTED NOT DETECTED Final   Coronavirus OC43 NOT DETECTED NOT DETECTED Final   Metapneumovirus NOT DETECTED NOT DETECTED Final   Rhinovirus / Enterovirus DETECTED (A) NOT DETECTED Final   Influenza A NOT DETECTED NOT DETECTED Final   Influenza B NOT DETECTED NOT DETECTED Final   Parainfluenza Virus 1 NOT DETECTED NOT DETECTED Final   Parainfluenza Virus 2 NOT DETECTED NOT DETECTED Final   Parainfluenza Virus 3 NOT DETECTED NOT DETECTED Final   Parainfluenza Virus 4 NOT DETECTED NOT DETECTED Final   Respiratory Syncytial  Virus NOT DETECTED NOT DETECTED Final   Bordetella pertussis NOT DETECTED NOT DETECTED Final   Bordetella Parapertussis NOT DETECTED NOT DETECTED Final   Chlamydophila pneumoniae NOT DETECTED NOT DETECTED Final   Mycoplasma pneumoniae NOT DETECTED NOT DETECTED Final    Comment: Performed at [facility], [address] [place], [place] [address]     Scheduled Meds:  busPIRone  10 mg Oral TID   Chlorhexidine Gluconate Cloth  6 each Topical Daily   fentaNYL  1 patch Transdermal Q72H   gabapentin  300 mg Oral TID   lidocaine  1 patch Transdermal Q24H   pantoprazole (PROTONIX) IV  40 mg Intravenous Q12H   rOPINIRole  0.5 mg Oral QHS   traMADol  50 mg Oral Once   Continuous Infusions:  0.9 % NaCl with KCl 20 mEq / L 75 mL/hr at 06/19/23 1839   diltiazem (CARDIZEM) infusion 10 mg/hr (06/20/23 1049)   heparin 1,000 Units/hr (06/20/23 1055)   piperacillin-tazobactam (ZOSYN)  IV 3.375 g (06/20/23 1348)    Procedures/Studies: US Abdomen Limited RUQ (LIVER/GB)  Result Date: 06/18/2023 CLINICAL DATA:  Elevated transaminase. EXAM: ULTRASOUND ABDOMEN LIMITED RIGHT UPPER QUADRANT COMPARISON:  Ultrasound June 2018.  CT scan earlier 06/18/2023 FINDINGS: Gallbladder: Previous surgical removal. Common bile duct: Diameter: 8 mm. Within normal limits for the patient's age in the post cholecystectomy state. This measured 10 mm in 2018. Liver:  Homogeneous hepatic parenchyma. Mild intrahepatic biliary duct ectasia. Portal vein is patent on color Doppler imaging with normal direction of blood flow towards the liver. Other: Study limited by overlapping bowel gas and soft tissue. IMPRESSION: Previous cholecystectomy. Stable prominent common duct and some mild intrahepatic biliary duct ectasia. Study limited by overlapping bowel gas and soft tissue. Electronically Signed   By: Karen Kays M.D.   On: 06/18/2023 17:25   CT CHEST ABDOMEN PELVIS WO CONTRAST  Result Date: 06/18/2023 CLINICAL DATA:  Dysphagia, acute generalized abdominal pain. EXAM: CT CHEST, ABDOMEN AND PELVIS WITHOUT CONTRAST TECHNIQUE: Multidetector CT imaging of the chest, abdomen and pelvis was performed following the standard protocol without IV contrast. RADIATION DOSE REDUCTION: This exam was performed according to the departmental dose-optimization program which includes automated exposure control, adjustment of the mA and/or kV according to patient size and/or use of iterative reconstruction technique. COMPARISON:  March 17, 2022. FINDINGS: CT CHEST FINDINGS Cardiovascular: Atherosclerosis of thoracic aorta is noted without aneurysm formation. Mild cardiomegaly. No pericardial effusion. Coronary artery calcifications are noted. Mediastinum/Nodes: No enlarged mediastinal, hilar, or axillary lymph nodes. Thyroid gland, trachea, and esophagus demonstrate no significant findings. Lungs/Pleura: No pneumothorax or pleural effusion is noted. Multiple patchy airspace opacities are noted bilaterally consistent with multifocal pneumonia. Mild left posterior basilar subsegmental atelectasis is noted. Musculoskeletal: Surgical posterior fusion is seen involving the thoracic and lumbar spine. No acute osseous abnormality is noted. CT ABDOMEN PELVIS FINDINGS Hepatobiliary: No focal liver abnormality is seen. Status post cholecystectomy. No biliary dilatation. Pancreas: Pancreatic atrophy is again  noted. No acute abnormality seen. Spleen: Normal in size without focal abnormality. Adrenals/Urinary Tract: Adrenal glands appear normal. Mild right renal atrophy is noted. No renal or ureteral calculi are noted. No definite hydronephrosis or renal obstruction is noted. Urinary bladder is unremarkable. Stomach/Bowel: Stomach is within normal limits. Appendix appears normal. No evidence of bowel wall thickening, distention, or inflammatory changes. Vascular/Lymphatic: Aortic atherosclerosis. No enlarged abdominal or pelvic lymph nodes. Reproductive: Uterus and bilateral adnexa are unremarkable. Other: Small fat containing periumbilical hernia. No ascites is noted.  Musculoskeletal: Status post surgical posterior fusion of lumbar spine as well. No acute osseous abnormality is noted. IMPRESSION: Multiple airspace opacities are noted bilaterally consistent with multifocal pneumonia. Mild left posterior basilar subsegmental atelectasis is noted. Mild right renal atrophy is again noted. Pancreatic atrophy is again noted. Small fat containing periumbilical hernia. Status post surgical posterior fusion involving thoracic and lumbar spine. Aortic Atherosclerosis (ICD10-I70.0). Electronically Signed   By: Lupita Raider M.D.   On: 06/18/2023 12:57   DG Chest Port 1 View  Result Date: 06/18/2023 CLINICAL DATA:  82 year old female with shortness of breath, abdominal pain and difficulty swallowing for 1 week. EXAM: PORTABLE CHEST 1 VIEW COMPARISON:  Chest radiographs 02/23/2023 and earlier. FINDINGS: Portable AP upright view at 1220 hours. Stable lung volumes and mediastinal contours. Chronic eventration of the right hemidiaphragm is stable. Improved left lung base ventilation since March. But there remains blunting of the left lateral costophrenic angle, and streaky retrocardiac opacity. No superimposed pneumothorax or pulmonary edema. No other confluent opacity. Extensive posterior spinal fusion hardware redemonstrated,  grossly stable. Stable cholecystectomy clips. Paucity of bowel gas in the visible upper abdomen. IMPRESSION: Improved left lung base ventilation since March, but evidence of a small left pleural effusion and residual streaky, nonspecific retrocardiac opacity. Electronically Signed   By: Odessa Fleming M.D.   On: 06/18/2023 12:29    Catarina Hartshorn, DO  [facility]  If 7PM-7AM, please contact night-coverage [email or web address] Password [email or web address] 06/20/2023, 5:20 PM   LOS: 2 days

## 2023-06-21 ENCOUNTER — Inpatient Hospital Stay (HOSPITAL_COMMUNITY): Payer: Medicare HMO

## 2023-06-21 ENCOUNTER — Other Ambulatory Visit (HOSPITAL_COMMUNITY): Payer: Self-pay | Admitting: *Deleted

## 2023-06-21 DIAGNOSIS — I4891 Unspecified atrial fibrillation: Secondary | ICD-10-CM | POA: Diagnosis not present

## 2023-06-21 DIAGNOSIS — J9601 Acute respiratory failure with hypoxia: Secondary | ICD-10-CM | POA: Diagnosis not present

## 2023-06-21 DIAGNOSIS — J690 Pneumonitis due to inhalation of food and vomit: Secondary | ICD-10-CM | POA: Diagnosis not present

## 2023-06-21 DIAGNOSIS — Z515 Encounter for palliative care: Secondary | ICD-10-CM | POA: Diagnosis not present

## 2023-06-21 DIAGNOSIS — Z7189 Other specified counseling: Secondary | ICD-10-CM | POA: Diagnosis not present

## 2023-06-21 DIAGNOSIS — R1310 Dysphagia, unspecified: Secondary | ICD-10-CM | POA: Diagnosis not present

## 2023-06-21 DIAGNOSIS — I4821 Permanent atrial fibrillation: Secondary | ICD-10-CM | POA: Diagnosis not present

## 2023-06-21 LAB — ECHOCARDIOGRAM COMPLETE
AR max vel: 1.79 cm2
AV Area VTI: 1.98 cm2
AV Area mean vel: 1.96 cm2
AV Mean grad: 7 mmHg
AV Peak grad: 15.4 mmHg
Ao pk vel: 1.97 m/s
Area-P 1/2: 4.99 cm2
MV VTI: 2.32 cm2
S' Lateral: 3.3 cm
Weight: 2945.35 oz

## 2023-06-21 LAB — HEPARIN LEVEL (UNFRACTIONATED): Heparin Unfractionated: 0.57 IU/mL (ref 0.30–0.70)

## 2023-06-21 LAB — CBC
HCT: 34.7 % — ABNORMAL LOW (ref 36.0–46.0)
Hemoglobin: 10.7 g/dL — ABNORMAL LOW (ref 12.0–15.0)
MCH: 28.5 pg (ref 26.0–34.0)
MCHC: 30.8 g/dL (ref 30.0–36.0)
MCV: 92.3 fL (ref 80.0–100.0)
Platelets: 144 10*3/uL — ABNORMAL LOW (ref 150–400)
RBC: 3.76 MIL/uL — ABNORMAL LOW (ref 3.87–5.11)
RDW: 14.8 % (ref 11.5–15.5)
WBC: 4.4 10*3/uL (ref 4.0–10.5)
nRBC: 0 % (ref 0.0–0.2)

## 2023-06-21 LAB — COMPREHENSIVE METABOLIC PANEL
ALT: 69 U/L — ABNORMAL HIGH (ref 0–44)
AST: 32 U/L (ref 15–41)
Albumin: 2.7 g/dL — ABNORMAL LOW (ref 3.5–5.0)
Alkaline Phosphatase: 153 U/L — ABNORMAL HIGH (ref 38–126)
Anion gap: 13 (ref 5–15)
BUN: 9 mg/dL (ref 8–23)
CO2: 21 mmol/L — ABNORMAL LOW (ref 22–32)
Calcium: 8.3 mg/dL — ABNORMAL LOW (ref 8.9–10.3)
Chloride: 102 mmol/L (ref 98–111)
Creatinine, Ser: 0.56 mg/dL (ref 0.44–1.00)
GFR, Estimated: >60 mL/min (ref >60)
Glucose, Bld: 73 mg/dL (ref 70–99)
Potassium: 3.4 mmol/L — ABNORMAL LOW (ref 3.5–5.1)
Sodium: 136 mmol/L (ref 135–145)
Total Bilirubin: 0.9 mg/dL (ref 0.3–1.2)
Total Protein: 5.8 g/dL — ABNORMAL LOW (ref 6.5–8.1)

## 2023-06-21 LAB — APTT: aPTT: 75 seconds — ABNORMAL HIGH (ref 24–36)

## 2023-06-21 LAB — MAGNESIUM: Magnesium: 2 mg/dL (ref 1.7–2.4)

## 2023-06-21 MED ORDER — POTASSIUM CHLORIDE 10 MEQ/100ML IV SOLN
10.0000 meq | INTRAVENOUS | Status: AC
  Administered 2023-06-21 (×2): 10 meq via INTRAVENOUS
  Filled 2023-06-21 (×2): qty 100

## 2023-06-21 MED ORDER — FUROSEMIDE 10 MG/ML IJ SOLN
40.0000 mg | Freq: Once | INTRAMUSCULAR | Status: AC
  Administered 2023-06-21: 40 mg via INTRAVENOUS
  Filled 2023-06-21: qty 4

## 2023-06-21 NOTE — Plan of Care (Signed)

## 2023-06-21 NOTE — Progress Notes (Signed)
*  PRELIMINARY RESULTS* Echocardiogram 2D Echocardiogram has been performed.  Carolyne Fiscal 06/21/2023, 9:29 AM

## 2023-06-21 NOTE — ED Provider Notes (Signed)
[facility] Provider Note   CSN: [id number] Arrival date & time: 06/18/23  1039     History  Chief Complaint  Patient presents with   Abdominal Pain    [name] [name] is a 82 y.o. female.  Patient presents with difficulty swallowing.  She has had surgery to help with this and had a PEG tube but the PEG tube has been removed.  The history is provided by the patient and a relative. No language interpreter was used.  Weakness Severity:  Mild Onset quality:  Sudden Timing:  Constant Progression:  Waxing and waning Chronicity:  New Context: not alcohol use   Relieved by:  Nothing Worsened by:  Nothing Ineffective treatments:  None tried Associated symptoms: no abdominal pain, no chest pain, no cough, no diarrhea, no frequency, no headaches and no seizures        Home Medications Prior to Admission medications   Medication Sig Start Date End Date Taking? Authorizing Provider  acetaminophen (TYLENOL) 325 MG tablet Take 650 mg by mouth 3 (three) times daily.   Yes [provider]  aspirin EC 81 MG tablet Take 81 mg by mouth daily. Swallow whole.   Yes [provider]  busPIRone (BUSPAR) 10 MG tablet Take 10 mg by mouth 3 (three) times daily.   Yes [provider]  clobetasol (TEMOVATE) 0.05 % external solution Apply 1 application. topically 2 (two) times a week. Monday and Friday   Yes [provider]  diclofenac Sodium (VOLTAREN) 1 % GEL Apply 2 g topically 3 (three) times daily. To neck, bilateral shoulders, and right hand (thumb area).   Yes [provider]  diltiazem (CARDIZEM) 120 MG tablet Take 120 mg by mouth 3 (three) times daily.   Yes [provider]  ELIQUIS 5 MG TABS tablet Take 1 tablet (5 mg total) by mouth 2 (two) times daily. 11/26/21  Yes Gottschalk, Ashly M, DO  eszopiclone (LUNESTA) 2 MG TABS tablet Take 1 tablet (2 mg total) by mouth at bedtime. 05/24/23  Yes Sharee Holster, NP   fentaNYL (DURAGESIC) 25 MCG/HR Place 1 patch onto the skin every 3 (three) days. 05/24/23  Yes Sharee Holster, NP  FLUoxetine (PROZAC) 20 MG capsule Take 20 mg by mouth daily. Give with 40 mg to equal 60   Yes [provider]  FLUoxetine (PROZAC) 40 MG capsule Take 40 mg by mouth daily. Give with 20 mg to equal 60   Yes [provider]  furosemide (LASIX) 40 MG tablet Take 40 mg by mouth daily.   Yes [provider]  gabapentin (NEURONTIN) 300 MG capsule Take 300 mg by mouth 3 (three) times daily.   Yes [provider]  lidocaine 4 % Place 1 patch onto the skin in the morning and at bedtime.   Yes [provider]  loratadine (CLARITIN) 10 MG tablet Take 10 mg by mouth as needed for itching.   Yes [provider]  methocarbamol (ROBAXIN) 500 MG tablet Take 500 mg by mouth every 6 (six) hours as needed for muscle spasms.   Yes [provider]  miconazole (MICOTIN) 2 % powder Apply 1 Application topically as directed. Apply a small amount to affected area every shift change as needed.   Yes [provider]  nitroGLYCERIN (NITROSTAT) 0.4 MG SL tablet Place 1 tablet (0.4 mg total) under the tongue every 5 (five) minutes as needed for chest pain (up to 3 doses). 11/17/21  Yes  Dyann Kief, PA-C  omeprazole (PRILOSEC) 20 MG capsule Take 20 mg by mouth 2 (two) times daily before a meal.   Yes [provider]  ondansetron (ZOFRAN-ODT) 4 MG disintegrating tablet Take 4 mg by mouth every 6 (six) hours as needed for nausea or vomiting.   Yes [provider]  potassium chloride (MICRO-K) 10 MEQ CR capsule Take 20 mEq by mouth daily.   Yes [provider]  rOPINIRole (REQUIP) 0.5 MG tablet Take 0.5 mg by mouth in the morning and at bedtime.   Yes [provider]  Simethicone Extra Strength 125 MG CAPS Take 1 capsule by mouth every 6 (six) hours as needed (Gas).   Yes [provider]  traMADol  (ULTRAM) 50 MG tablet Take 0.5 tablets (25 mg total) by mouth every 8 (eight) hours. 05/24/23  Yes Sharee Holster, NP  DOXYCYCLINE HYCLATE PO Take 100 mg by mouth in the morning and at bedtime. Patient not taking: Reported on 06/18/2023    [provider]      Allergies    Buprenorphine hcl, Cymbalta [duloxetine hcl], Morphine and codeine, Oxycodone-acetaminophen, Valium [diazepam], Statins, Morphine, Altace [ramipril], Floxin [ofloxacin], Hydromorphone, Lovaza [omega-3-acid ethyl esters], Trilipix [choline fenofibrate], and Zetia [ezetimibe]    Review of Systems   Review of Systems  Constitutional:  Negative for appetite change and fatigue.  HENT:  Negative for congestion, ear discharge and sinus pressure.        Difficulty swallowing  Eyes:  Negative for discharge.  Respiratory:  Negative for cough.   Cardiovascular:  Negative for chest pain.  Gastrointestinal:  Negative for abdominal pain and diarrhea.  Genitourinary:  Negative for frequency and hematuria.  Musculoskeletal:  Negative for back pain.  Skin:  Negative for rash.  Neurological:  Positive for weakness. Negative for seizures and headaches.  Psychiatric/Behavioral:  Negative for hallucinations.     Physical Exam Updated Vital Signs BP 135/60   Pulse 67   Temp 97.9 F (36.6 C)   Resp 16   Wt 83.5 kg   SpO2 97%   BMI 28.83 kg/m  Physical Exam Vitals and nursing note reviewed.  Constitutional:      Appearance: She is well-developed.  HENT:     Head: Normocephalic.     Nose: Nose normal.  Eyes:     General: No scleral icterus.    Conjunctiva/sclera: Conjunctivae normal.  Neck:     Thyroid: No thyromegaly.  Cardiovascular:     Rate and Rhythm: Normal rate. Rhythm irregular.     Heart sounds: No murmur heard.    No friction rub. No gallop.  Pulmonary:     Breath sounds: No stridor. No wheezing or rales.  Chest:     Chest wall: No tenderness.  Abdominal:     General: There is no distension.      Tenderness: There is no abdominal tenderness. There is no rebound.  Musculoskeletal:        General: Normal range of motion.     Cervical back: Neck supple.  Lymphadenopathy:     Cervical: No cervical adenopathy.  Skin:    Findings: No erythema or rash.  Neurological:     Mental Status: She is alert and oriented to person, place, and time.     Motor: No abnormal muscle tone.     Coordination: Coordination normal.  Psychiatric:        Behavior: Behavior normal.     ED Results / Procedures / Treatments  Labs (all labs ordered are listed, but only abnormal results are displayed) Labs Reviewed  RESPIRATORY PANEL BY PCR - Abnormal; Notable for the following components:      Result Value   Rhinovirus / Enterovirus DETECTED (*)    All other components within normal limits  CBC WITH DIFFERENTIAL/PLATELET - Abnormal; Notable for the following components:   Hemoglobin 11.6 (*)    RDW 15.6 (*)    All other components within normal limits  COMPREHENSIVE METABOLIC PANEL - Abnormal; Notable for the following components:   Sodium 131 (*)    Chloride 97 (*)    Glucose, Bld 130 (*)    Calcium 8.3 (*)    Albumin 3.4 (*)    AST 216 (*)    ALT 210 (*)    Alkaline Phosphatase 250 (*)    All other components within normal limits  HEPARIN LEVEL (UNFRACTIONATED) - Abnormal; Notable for the following components:   Heparin Unfractionated >1.10 (*)    All other components within normal limits  APTT - Abnormal; Notable for the following components:   aPTT 89 (*)    All other components within normal limits  URINALYSIS, W/ REFLEX TO CULTURE (INFECTION SUSPECTED) - Abnormal; Notable for the following components:   APPearance HAZY (*)    Hgb urine dipstick SMALL (*)    Ketones, ur 20 (*)    Protein, ur 30 (*)    All other components within normal limits  BRAIN NATRIURETIC PEPTIDE - Abnormal; Notable for the following components:   B Natriuretic Peptide 521.0 (*)    All other components within  normal limits  COMPREHENSIVE METABOLIC PANEL - Abnormal; Notable for the following components:   Sodium 134 (*)    Calcium 8.2 (*)    Albumin 3.0 (*)    AST 109 (*)    ALT 142 (*)    Alkaline Phosphatase 194 (*)    All other components within normal limits  CBC - Abnormal; Notable for the following components:   Hemoglobin 11.1 (*)    HCT 35.3 (*)    RDW 15.6 (*)    All other components within normal limits  HEPARIN LEVEL (UNFRACTIONATED) - Abnormal; Notable for the following components:   Heparin Unfractionated >1.10 (*)    All other components within normal limits  APTT - Abnormal; Notable for the following components:   aPTT 115 (*)    All other components within normal limits  APTT - Abnormal; Notable for the following components:   aPTT 73 (*)    All other components within normal limits  CBC - Abnormal; Notable for the following components:   Hemoglobin 10.8 (*)    HCT 35.6 (*)    Platelets 148 (*)    All other components within normal limits  HEPARIN LEVEL (UNFRACTIONATED) - Abnormal; Notable for the following components:   Heparin Unfractionated 0.81 (*)    All other components within normal limits  APTT - Abnormal; Notable for the following components:   aPTT 73 (*)    All other components within normal limits  COMPREHENSIVE METABOLIC PANEL - Abnormal; Notable for the following components:   Calcium 8.1 (*)    Total Protein 6.0 (*)    Albumin 2.8 (*)    AST 50 (*)    ALT 92 (*)    Alkaline Phosphatase 158 (*)    All other components within normal limits  CBC - Abnormal; Notable for the following components:   RBC 3.76 (*)    Hemoglobin 10.7 (*)  HCT 34.7 (*)    Platelets 144 (*)    All other components within normal limits  APTT - Abnormal; Notable for the following components:   aPTT 75 (*)    All other components within normal limits  COMPREHENSIVE METABOLIC PANEL - Abnormal; Notable for the following components:   Potassium 3.4 (*)    CO2 21 (*)     Calcium 8.3 (*)    Total Protein 5.8 (*)    Albumin 2.7 (*)    ALT 69 (*)    Alkaline Phosphatase 153 (*)    All other components within normal limits  CULTURE, BLOOD (ROUTINE X 2)  CULTURE, BLOOD (ROUTINE X 2)  RESP PANEL BY RT-PCR (RSV, FLU A&B, COVID)  RVPGX2  MRSA NEXT GEN BY PCR, NASAL  LACTIC ACID, PLASMA  LACTIC ACID, PLASMA  TSH  PROCALCITONIN  HEPATITIS PANEL, ACUTE  HEPARIN LEVEL (UNFRACTIONATED)  MAGNESIUM    EKG EKG Interpretation Date/Time:  Friday June 18 2023 11:28:18 EDT Ventricular Rate:  90 PR Interval:    QRS Duration:  83 QT Interval:  344 QTC Calculation: 421 R Axis:   56  Text Interpretation: Atrial fibrillation Nonspecific ST abnormality Abnormal ekg PVCs/NSVT no longer present Rate faster Since last tracing Confirmed by Arvilla Meres [phone]) on 06/19/2023 9:53:34 PM  Radiology ECHOCARDIOGRAM COMPLETE  Result Date: 06/21/2023    ECHOCARDIOGRAM REPORT   Patient Name:   [name] [name] Date of Exam: 06/21/2023 Medical Rec #:  [id number]       Height:       67.0 in Accession #:    [id number]      Weight:       184.1 lb Date of Birth:  1941/02/04        BSA:          1.952 m Patient Age:    81 years        BP:           155/54 mmHg Patient Gender: F               HR:           102 bpm. [facility]am [facility] Procedure: 2D Echo, Cardiac Doppler and Color Doppler Indications:    Atrial Fibrillation  History:        Patient has prior history of Echocardiogram examinations, most                 recent 10/27/2021. CHF, CAD, Arrythmias:Atrial Fibrillation; Risk                 Factors:Hypertension, Dyslipidemia and Sleep Apnea.  Sonographer:    Mikki Harbor Referring Phys: [id number] DAVID TAT IMPRESSIONS  1. Left ventricular ejection fraction, by estimation, is 60 to 65%. The left ventricle has normal function. The left ventricle has no regional wall motion abnormalities. There is mild left ventricular hypertrophy. Left ventricular diastolic parameters are  indeterminate.  2. Right ventricular systolic function is normal. The right ventricular size is normal. There is mildly elevated pulmonary artery systolic pressure.  3. Left atrial size was moderately dilated.  4. Right atrial size was mildly dilated.  5. The mitral valve is abnormal. Mild to moderate mitral valve regurgitation. No evidence of mitral stenosis.  6. The tricuspid valve is abnormal. Tricuspid valve regurgitation is moderate.  7. The aortic valve is bicuspid. There is mild calcification of the aortic valve. There is mild thickening of the aortic valve. Aortic valve regurgitation is not  visualized. No aortic stenosis is present.  8. The inferior vena cava is normal in size with greater than 50% respiratory variability, suggesting right atrial pressure of 3 mmHg. FINDINGS  Left Ventricle: Left ventricular ejection fraction, by estimation, is 60 to 65%. The left ventricle has normal function. The left ventricle has no regional wall motion abnormalities. The left ventricular internal cavity size was normal in size. There is  mild left ventricular hypertrophy. Left ventricular diastolic parameters are indeterminate. Right Ventricle: The right ventricular size is normal. Right vetricular wall thickness was not well visualized. Right ventricular systolic function is normal. There is mildly elevated pulmonary artery systolic pressure. The tricuspid regurgitant velocity  is 3.21 m/s, and with an assumed right atrial pressure of 3 mmHg, the estimated right ventricular systolic pressure is 44.2 mmHg. Left Atrium: Left atrial size was moderately dilated. Right Atrium: Right atrial size was mildly dilated. Pericardium: There is no evidence of pericardial effusion. Mitral Valve: The mitral valve is abnormal. There is mild thickening of the mitral valve leaflet(s). There is mild calcification of the mitral valve leaflet(s). Mild to moderate mitral valve regurgitation. No evidence of mitral valve stenosis. MV peak  gradient, 11.4 mmHg. The mean mitral valve gradient is 3.5 mmHg. Tricuspid Valve: The tricuspid valve is abnormal. Tricuspid valve regurgitation is moderate . No evidence of tricuspid stenosis. Aortic Valve: The aortic valve is bicuspid. There is mild calcification of the aortic valve. There is mild thickening of the aortic valve. There is mild aortic valve annular calcification. Aortic valve regurgitation is not visualized. No aortic stenosis is present. Aortic valve mean gradient measures 7.0 mmHg. Aortic valve peak gradient measures 15.4 mmHg. Aortic valve area, by VTI measures 1.98 cm. Pulmonic Valve: The pulmonic valve was not well visualized. Pulmonic valve regurgitation is not visualized. No evidence of pulmonic stenosis. Aorta: The aortic root is normal in size and structure. Venous: The inferior vena cava is normal in size with greater than 50% respiratory variability, suggesting right atrial pressure of 3 mmHg. IAS/Shunts: No atrial level shunt detected by color flow Doppler.  LEFT VENTRICLE PLAX 2D LVIDd:         5.30 cm LVIDs:         3.30 cm LV PW:         1.10 cm LV IVS:        1.10 cm LVOT diam:     1.90 cm LV SV:         78 LV SV Index:   40 LVOT Area:     2.84 cm  RIGHT VENTRICLE RV Basal diam:  3.30 cm RV Mid diam:    2.60 cm RV S prime:     12.00 cm/s LEFT ATRIUM             Index        RIGHT ATRIUM           Index LA diam:        4.80 cm 2.46 cm/m   RA Area:     20.20 cm LA Vol (A2C):   97.0 ml 49.69 ml/m  RA Volume:   53.80 ml  27.56 ml/m LA Vol (A4C):   72.0 ml 36.88 ml/m LA Biplane Vol: 84.7 ml 43.39 ml/m  AORTIC VALVE                     PULMONIC VALVE AV Area (Vmax):    1.79 cm      PV Vmax:  1.01 m/s AV Area (Vmean):   1.96 cm      PV Peak grad:  4.1 mmHg AV Area (VTI):     1.98 cm AV Vmax:           196.50 cm/s AV Vmean:          120.500 cm/s AV VTI:            0.391 m AV Peak Grad:      15.4 mmHg AV Mean Grad:      7.0 mmHg LVOT Vmax:         124.00 cm/s LVOT Vmean:         83.150 cm/s LVOT VTI:          0.274 m LVOT/AV VTI ratio: 0.70  AORTA Ao Root diam: 3.30 cm MITRAL VALVE                TRICUSPID VALVE MV Area (PHT): 4.99 cm     TR Peak grad:   41.2 mmHg MV Area VTI:   2.32 cm     TR Vmax:        321.00 cm/s MV Peak grad:  11.4 mmHg MV Mean grad:  3.5 mmHg     SHUNTS MV Vmax:       1.68 m/s     Systemic VTI:  0.27 m MV Vmean:      82.5 cm/s    Systemic Diam: 1.90 cm MV Decel Time: 152 msec MV E velocity: 127.00 cm/s Dina Rich MD Electronically signed by Dina Rich MD Signature Date/Time: 06/21/2023/10:28:34 AM    Final     Procedures Procedures    Medications Ordered in ED Medications  diltiazem (CARDIZEM) 125 mg in dextrose 5% 125 mL (1 mg/mL) infusion (5 mg/hr Intravenous New Bag/Given 06/21/23 1018)  busPIRone (BUSPAR) tablet 10 mg (10 mg Oral Not Given 06/21/23 0940)  acetaminophen (TYLENOL) tablet 650 mg (has no administration in time range)    Or  acetaminophen (TYLENOL) suppository 650 mg (has no administration in time range)  ondansetron (ZOFRAN) tablet 4 mg ( Oral See Alternative 06/18/23 2354)    Or  ondansetron (ZOFRAN) injection 4 mg (4 mg Intravenous Given 06/18/23 2354)  0.9 % NaCl with KCl 20 mEq/ L  infusion (0 mL/hr Intravenous Stopped 06/19/23 1839)  fentaNYL (SUBLIMAZE) injection 25 mcg (25 mcg Intravenous Given 06/21/23 1045)  heparin ADULT infusion 100 units/mL (25000 units/260mL) (1,000 Units/hr Intravenous Infusion Verify 06/21/23 1018)  piperacillin-tazobactam (ZOSYN) IVPB 3.375 g ( Intravenous Infusion Verify 06/21/23 1018)  Chlorhexidine Gluconate Cloth 2 % PADS 6 each (6 each Topical Given 06/21/23 0941)  lidocaine (LIDODERM) 5 % 1 patch (1 patch Transdermal Patch Applied 06/21/23 1045)  fentaNYL (DURAGESIC) 50 MCG/HR 1 patch (1 patch Transdermal Patch Applied 06/19/23 1242)  pantoprazole (PROTONIX) injection 40 mg (40 mg Intravenous Given 06/21/23 1037)  diazepam (VALIUM) injection 2.5 mg (2.5 mg Intravenous Given 06/20/23 0358)  0.9 %  NaCl with KCl 20 mEq/ L  infusion (0 mL/hr Intravenous Stopped 06/20/23 1934)  potassium chloride 10 mEq in 100 mL IVPB (10 mEq Intravenous New Bag/Given 06/21/23 1037)  sodium chloride 0.9 % bolus 500 mL (0 mLs Intravenous Stopped 06/18/23 1534)  diltiazem (CARDIZEM) injection 10 mg (10 mg Intravenous Given 06/18/23 1543)  cefTRIAXone (ROCEPHIN) 2 g in sodium chloride 0.9 % 100 mL IVPB (0 g Intravenous Stopped 06/18/23 1615)  azithromycin (ZITHROMAX) 500 mg in sodium chloride 0.9 % 250 mL IVPB (500 mg Intravenous New Bag/Given 06/18/23 1615)  fentaNYL (SUBLIMAZE) injection 25 mcg (25 mcg Intravenous Given 06/18/23 1539)  heparin bolus via infusion 3,000 Units (3,000 Units Intravenous Bolus from Bag 06/18/23 1734)  ondansetron (ZOFRAN) injection 4 mg (4 mg Intravenous Given 06/18/23 1736)  fentaNYL (SUBLIMAZE) injection 25 mcg (25 mcg Intravenous Given 06/19/23 0433)    ED Course/ Medical Decision Making/ A&P  Patient was found to be hypoxic most likely from aspiration.  She has chronic aspiration                           Medical Decision Making Amount and/or Complexity of Data Reviewed Labs: ordered. Radiology: ordered.  Risk Prescription drug management. Decision regarding hospitalization.     This patient presents to the ED for concern of difficulty swallowing and shortness of breath, this involves an extensive number of treatment options, and is a complaint that carries with it a high risk of complications and morbidity.  The differential diagnosis includes pneumonia, oral pharynx inflammation   Co morbidities that complicate the patient evaluation  Atrial fibs   Additional history obtained:  Additional history obtained from daughter External records from outside source obtained and reviewed including hospital records   Lab Tests:  I Ordered, and personally interpreted labs.  The pertinent results include: BMP 521   Imaging Studies ordered:  I ordered imaging studies  including chest x-ray I independently visualized and interpreted imaging which showed small pleural effusion I agree with the radiologist interpretation   Cardiac Monitoring: / EKG:  The patient was maintained on a cardiac monitor.  I personally viewed and interpreted the cardiac monitored which showed an underlying rhythm of: Atrial fibs   Consultations Obtained:  I requested consultation with the hospitalist,  and discussed lab and imaging findings as well as pertinent plan - they recommend: Admit and treat aspiration pneumonia   Problem List / ED Course / Critical interventions / Medication management  Atrial fibs and hypoxia I ordered medication including antibiotics for hypoxia Reevaluation of the patient after these medicines showed that the patient stayed the same I have reviewed the patients home medicines and have made adjustments as needed   Social Determinants of Health:  None   Test / Admission - Considered:  None   Hypoxia from aspiration pneumonia.  She will be admitted to medicine        Final Clinical Impression(s) / ED Diagnoses Final diagnoses:  None    Rx / DC Orders ED Discharge Orders     None         Bethann Berkshire, MD 06/21/23 1127

## 2023-06-21 NOTE — [place] FL2 (Signed)
[place] MEDICAID [id number] LEVEL OF CARE FORM     IDENTIFICATION  Patient Name: [name] [name] Birthdate: 20-Jan-1941 Sex: female Admission Date (Current Location): 06/18/2023  [name] and [place] Number:  Aaron Edelman [id number] Facility and Address:  [facility],  [address] [place] [address]      Provider Number: [id number]  Attending Physician Name and Address:  Catarina Hartshorn, MD  Relative Name and Phone Number:  Analysse, [place] (Daughter) [phone]    Current Level of Care: Hospital Recommended Level of Care: Skilled Nursing Facility Prior Approval Number:    Date Approved/Denied:   PASRR Number:    Discharge Plan: SNF    Current Diagnoses: Patient Active Problem List   Diagnosis Date Noted   Aspiration pneumonitis (HCC) 06/18/2023   Acute respiratory failure with hypoxia (HCC) 06/18/2023   Chronic non-seasonal allergic rhinitis 06/02/2023   Chronic right hip pain 03/16/2023   Senile purpura (HCC) 03/16/2023   Wheezing 02/17/2023   Bilateral lower extremity edema 12/28/2022   Right sacral radiculopathy 11/09/2022   Severe protein-calorie malnutrition (HCC) 05/25/2022   Generalized osteoarthritis 04/22/2022   Aortic atherosclerosis (HCC) 04/15/2022   Restless leg syndrome 04/15/2022   At risk for adverse drug event 03/05/2022   Major depression, recurrent, chronic (HCC) 03/02/2022   Hyperlipidemia LDL goal <100 03/02/2022   Aspiration pneumonia (HCC) 03/02/2022   Lichen sclerosus et atrophicus 11/26/2020   Osteoarthritis of first carpometacarpal joint of left hand 07/09/2020   Osteoarthritis of first carpometacarpal joint of right hand 07/09/2020   Dysphagia 03/25/2020   HOH (hard of hearing) 03/25/2020   Lichen sclerosus et atrophicus of the vulva 01/03/2019   Long-term current use of opiate analgesic 02/14/2018   Anxiety 07/21/2016   Chronic diastolic CHF (congestive heart failure) (HCC) 07/17/2015   Anemia due to chronic blood loss 07/02/2015    DDD (degenerative disc disease), lumbar 03/22/2015   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   Lumbar scoliosis 03/22/2015   MGUS (monoclonal gammopathy of unknown significance) 01/08/2015   Permanent atrial fibrillation (HCC) 12/22/2014   CAD (coronary artery disease) 12/22/2014   HCAP (healthcare-associated pneumonia) 03/27/2014   S/P spinal fusion 02/23/2014   Chronic pain associated with significant psychosocial dysfunction 02/23/2014   Paraesophageal hiatal hernia 07/06/2013   DEGENERATIVE JOINT DISEASE 04/08/2010   Obesity (BMI 30.0-34.9) 04/04/2009   Irritable bowel syndrome 11/13/2008   ESOPHAGEAL STRICTURE 11/12/2008   GERD 11/12/2008   Paralysis of diaphragm 11/12/2008   Obstructive sleep apnea 05/23/2008   Essential hypertension 05/22/2008   Insomnia 05/22/2008    Orientation RESPIRATION BLADDER Height & Weight     Self, Situation, Place  Normal Incontinent Weight: 184 lb 1.4 oz (83.5 kg) Height:     BEHAVIORAL SYMPTOMS/MOOD NEUROLOGICAL BOWEL NUTRITION STATUS      Incontinent Diet (see d/c summary)  AMBULATORY STATUS COMMUNICATION OF NEEDS Skin   Extensive Assist Verbally Normal                       Personal Care Assistance Level of Assistance  Bathing, Feeding, Dressing Bathing Assistance: Limited assistance Feeding assistance: Independent Dressing Assistance: Limited assistance     Functional Limitations Info  Sight, Hearing, Speech Sight Info: Adequate Hearing Info: Adequate Speech Info: Adequate    SPECIAL CARE FACTORS FREQUENCY                       Contractures Contractures Info: Not present    Additional Factors Info  Code Status, Allergies, Psychotropic Code  Status Info: DNR Allergies Info: Buprenorphine Hcl, Cymbalta (Duloxetine Hcl), Morphine And Codeine, Oxycodone-acetaminophen, Valium (Diazepam), Statins, Morphine, Altace (Ramipril), Floxin (Ofloxacin), Hydromorphone, Lovaza (Omega-3-acid Ethyl Esters), Trilipix (Choline  Fenofibrate), Zetia (Ezetimibe) Psychotropic Info: buspar, prozac         Current Medications (06/21/2023):  This is the current hospital active medication list Current Facility-Administered Medications  Medication Dose Route Frequency Provider Last Rate Last Admin   acetaminophen (TYLENOL) tablet 650 mg  650 mg Oral Q6H PRN Tat, David, MD       Or   acetaminophen (TYLENOL) suppository 650 mg  650 mg Rectal Q6H PRN Tat, Onalee Hua, MD       busPIRone (BUSPAR) tablet 10 mg  10 mg Oral TID Tat, Onalee Hua, MD       Chlorhexidine Gluconate Cloth 2 % PADS 6 each  6 each Topical Daily Tat, David, MD   6 each at 06/21/23 0941   diazepam (VALIUM) injection 2.5 mg  2.5 mg Intravenous Q6H PRN Tat, Onalee Hua, MD   2.5 mg at 06/20/23 0358   diltiazem (CARDIZEM) 125 mg in dextrose 5% 125 mL (1 mg/mL) infusion  5 mg/hr Intravenous Continuous Tat, David, MD 5 mL/hr at 06/21/23 1018 5 mg/hr at 06/21/23 1018   fentaNYL (DURAGESIC) 50 MCG/HR 1 patch  1 patch Transdermal Elijio Miles, MD   1 patch at 06/19/23 1242   fentaNYL (SUBLIMAZE) injection 25 mcg  25 mcg Intravenous Q2H PRN Tat, Onalee Hua, MD   25 mcg at 06/21/23 1045   heparin ADULT infusion 100 units/mL (25000 units/240mL)  1,000 Units/hr Intravenous Continuous Bell, Lorin C, RPH 10 mL/hr at 06/21/23 1018 1,000 Units/hr at 06/21/23 1018   lidocaine (LIDODERM) 5 % 1 patch  1 patch Transdermal Q24H Tat, Onalee Hua, MD   1 patch at 06/21/23 1045   ondansetron (ZOFRAN) tablet 4 mg  4 mg Oral Q6H PRN Tat, David, MD       Or   ondansetron (ZOFRAN) injection 4 mg  4 mg Intravenous Q6H PRN Tat, David, MD   4 mg at 06/18/23 2354   pantoprazole (PROTONIX) injection 40 mg  40 mg Intravenous Q12H TatOnalee Hua, MD   40 mg at 06/21/23 1037   piperacillin-tazobactam (ZOSYN) IVPB 3.375 g  3.375 g Intravenous Q8H Adefeso, Oladapo, DO 12.5 mL/hr at 06/21/23 1018 Infusion Verify at 06/21/23 1018     Discharge Medications: Please see discharge summary for a list of discharge  medications.  Relevant Imaging Results:  Relevant Lab Results:   Additional Information    Vail Vuncannon, Juleen China, LCSW

## 2023-06-21 NOTE — Progress Notes (Signed)
Speech Language Pathology Treatment: Dysphagia  Patient Details Name: [name] [name] MRN: [id number] DOB: Nov 13, 1941 Today's Date: 06/21/2023 Time: 1209-1232 SLP Time Calculation (min) (ACUTE ONLY): 23 min  Assessment / Plan / Recommendation Clinical Impression  Pt seen for ongoing dysphagia intervention following BSE completed yesterday. Pt to have BPE today. SLP will follow pending those results, however suspect Pt continues to have complex multifactorial dysphagia with esophageal and pharyngeal components. She continues to be "guarded" and hesitant to take anything by mouth. She is NPO pending BPE, but able to take some ice chips and sips of water with SLP now. She demonstrates impaired oral phase coordination, however suspect this is due to fear of swallowing. She required cues to close her lips around the cup, take a sip, close her mouth, and swallow. Without the cues, she let the liquid spill from her mouth and/or started talking/breathing with the water in her mouth. Pt presents with suspected primary esophageal dysphagia, which also negatively impacts pharyngeal phase. Continue with liquids only pending results of BPE and if GI plans for EGD. SLP will follow. Plan discussed with Pt and daughter.    HPI HPI: 82 year old female with a history of chronic back pain status post fusion, chronic lower extremity edema, coronary artery disease, HFmrEF (50-55%), permanent atrial fibrillation, hypertension, hyperlipidemia, restless leg syndrome, esophageal stricture, paraesophageal hernia status postsurgical repair, depression/anxiety presenting with increasing difficulty swallowing over the past month, worsening over the past 2 days.  He was also been complaining of lower abdominal pain. Notably, the patient had a paraesophageal hernia repair, Heller myotomy, and gastropexy at [facility] on 02/17/22.  The patient had a complex postoperative history. See dysphagia history below. Pt is known to SLP service from  previous dysphagia history (esophageal and pharyngeal). Her last MBSS was completed 09/2022 and PEG was removed 12/2022. Pt now with increased signs of pharyngoesophageal dysphagia and BSE requested. GI is also following and considering BPE this week.      SLP Plan  Continue with current plan of care      Recommendations for follow up therapy are one component of a multi-disciplinary discharge planning process, led by the attending physician.  Recommendations may be updated based on patient status, additional functional criteria and insurance authorization.    Recommendations  Diet recommendations: Thin liquid (clear to full liquids) Liquids provided via: Cup;Straw Medication Administration: Via alternative means Supervision: Staff to assist with self feeding Compensations: Slow rate;Small sips/bites;Multiple dry swallows after each bite/sip;Clear throat intermittently Postural Changes and/or Swallow Maneuvers: Seated upright 90 degrees;Upright 30-60 min after meal                  Oral care prior to ice chip/H20;Staff/trained caregiver to provide oral care;Oral care before and after PO   Frequent or constant Supervision/Assistance Dysphagia, pharyngoesophageal phase (R13.14)     Continue with current plan of care    Thank you,  Havery Moros, CCC-SLP [phone]  Genaro Bekker  06/21/2023, 3:22 PM

## 2023-06-21 NOTE — Progress Notes (Signed)
ANTICOAGULATION CONSULT NOTE   Pharmacy Consult for heparin Indication: atrial fibrillation  Allergies  Allergen Reactions   Buprenorphine Hcl Itching   Cymbalta [Duloxetine Hcl] Other (See Comments)    Other reaction(s): Other (See Comments) Personality changes - crying  2014   Morphine And Codeine Itching   Oxycodone-Acetaminophen Other (See Comments)    Personality changes    Valium [Diazepam] Other (See Comments)    Personality changes - "in another world" ;  Hallucinations  2015   Statins Other (See Comments)    Other reaction(s): Other (See Comments) Muscle weakness Muscle weakness   Morphine Itching   Altace [Ramipril] Other (See Comments)    unknown   Floxin [Ofloxacin] Other (See Comments)    unknown   Hydromorphone Other (See Comments)    Other reaction(s): Confusion (intolerance) unknown   Lovaza [Omega-3-Acid Ethyl Esters] Other (See Comments)    Muscle weakness    Trilipix [Choline Fenofibrate] Other (See Comments)    Muscle weakness    Zetia [Ezetimibe] Other (See Comments)    Muscle weakness     Patient Measurements: Weight: 83.5 kg (184 lb 1.4 oz) Heparin Dosing Weight: 80 kg  Vital Signs: Temp: 97.9 F (36.6 C) (06/30 2200) BP: 155/54 (07/01 0600) Pulse Rate: 75 (07/01 0600)  Labs: Recent Labs    06/19/23 0442 06/19/23 1717 06/20/23 0533 06/21/23 0353  HGB 11.1*  --  10.8* 10.7*  HCT 35.3*  --  35.6* 34.7*  PLT 166  --  148* 144*  APTT 115* 73* 73* 75*  HEPARINUNFRC >1.10*  --  0.81* 0.57  CREATININE 0.59  --  0.53 0.56     Estimated Creatinine Clearance: 59.2 mL/min (by C-G formula based on SCr of 0.56 mg/dL).   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  difficile colitis 07/21/2022   CAD (coronary artery disease)    a. Abnl nuc 11/2014. Cath 12/2014 - turned down for CABG. Ultimately s/p TTVP, rotational atherectomy, PTCA and stenting of the ostial LCx and left main into the LAD (crush technique), and IVUS of the LAD/Left main. // b. Myoview 11/17: EF 48, poor quality/significant artifact; inf-lateral, inferior ischemia; Intermediate Risk   Chronic atrial fibrillation (HCC)    a.  First noted post-op 9/15 spinal fusion.  She had cardioversion, not on anticoagulation. Fall risk, unsteady. // failed DCCV // Holter 10/17: AFib, Avg HR 97, PVCs, no other arrhythmia   Chronic back pain greater than 3 months duration    a. spinal stenosis.  Spinal fusion with rods in 2/15 at [facility] spinal fusion 9/15.   Chronic diastolic CHF    Echo 2/17:  EF 50-55%, trivial AI, midl MR, mod LAE, PASP 37 mmHg // Echocardiogram 03/2020: EF 45-50, no RWMA, mild LVH, mild reduced RVSF, mildly elevated PASP (RVSP 38.3), mild LAE, mild MR, mild to mod TR, trivial AI // Echo 9/21: EF 45, mild LVH, mildly reduced RV SF, moderate LAE, mild RAE, mild MR, mild AI      Circadian rhythm sleep disorder    CTS (carpal tunnel syndrome)    Deficiency anemia 11/10/2014   Depression    Diarrhea    Diverticulitis of colon    Esophageal stricture  Gastritis    Gastroesophageal hernia 07/06/2013   GERD (gastroesophageal reflux disease)    Hiatal hernia    History of blood transfusion    "most of them related to OR's"    HTN (hypertension)    Hyperlipidemia    IBS (irritable bowel syndrome)    Incontinence 10/13/2012   Insomnia    Ischemic chest pain (HCC)    Memory disorder 12/04/2014   Metabolic syndrome 04/24/2014   MGUS (monoclonal gammopathy of unknown significance) dx'd 11/2014   a. Neg BMB 11/2014.   Multiple falls    Obesity    Obstructive sleep apnea    "have mask; don't wear it" (01/07/2015)   Orthostasis    PAT (paroxysmal atrial tachycardia)     Personal history of colonic polyps 10/25/2011 & 12/02/11   not retrieved Dr Victorino Dike & tubular adenomas   Pneumonia 03/2014   Sinus bradycardia    a. Baseline HR 50s-60s.   Stroke [facility]) early 2000's   "small"; denies residual on 01/07/2015)    Medications:  Medications Prior to Admission  Medication Sig Dispense Refill Last Dose   acetaminophen (TYLENOL) 325 MG tablet Take 650 mg by mouth 3 (three) times daily.   06/17/2023   aspirin EC 81 MG tablet Take 81 mg by mouth daily. Swallow whole.   06/17/2023   busPIRone (BUSPAR) 10 MG tablet Take 10 mg by mouth 3 (three) times daily.   06/17/2023   clobetasol (TEMOVATE) 0.05 % external solution Apply 1 application. topically 2 (two) times a week. Monday and Friday   06/14/2023   diclofenac Sodium (VOLTAREN) 1 % GEL Apply 2 g topically 3 (three) times daily. To neck, bilateral shoulders, and right hand (thumb area).   06/17/2023   diltiazem (CARDIZEM) 120 MG tablet Take 120 mg by mouth 3 (three) times daily.   06/17/2023   ELIQUIS 5 MG TABS tablet Take 1 tablet (5 mg total) by mouth 2 (two) times daily. 180 tablet 3 06/17/2023 at 2158   eszopiclone (LUNESTA) 2 MG TABS tablet Take 1 tablet (2 mg total) by mouth at bedtime. 30 tablet 0 06/17/2023   fentaNYL (DURAGESIC) 25 MCG/HR Place 1 patch onto the skin every 3 (three) days. 10 patch 0 06/15/2023   FLUoxetine (PROZAC) 20 MG capsule Take 20 mg by mouth daily. Give with 40 mg to equal 60   06/17/2023   FLUoxetine (PROZAC) 40 MG capsule Take 40 mg by mouth daily. Give with 20 mg to equal 60   06/17/2023   furosemide (LASIX) 40 MG tablet Take 40 mg by mouth daily.   06/17/2023   gabapentin (NEURONTIN) 300 MG capsule Take 300 mg by mouth 3 (three) times daily.   06/17/2023   lidocaine 4 % Place 1 patch onto the skin in the morning and at bedtime.   06/17/2023   loratadine (CLARITIN) 10 MG tablet Take 10 mg by mouth as needed for itching.   05/30/2023   methocarbamol (ROBAXIN) 500 MG tablet Take 500 mg by mouth  every 6 (six) hours as needed for muscle spasms.   06/12/2023   miconazole (MICOTIN) 2 % powder Apply 1 Application topically as directed. Apply a small amount to affected area every shift change as needed.   Unk   nitroGLYCERIN (NITROSTAT) 0.4 MG SL tablet Place 1 tablet (0.4 mg total) under the tongue every 5 (five) minutes as needed for chest pain (up to 3 doses). 25 tablet 3 Unk   omeprazole (PRILOSEC) 20 MG capsule Take 20  mg by mouth 2 (two) times daily before a meal.   06/17/2023   ondansetron (ZOFRAN-ODT) 4 MG disintegrating tablet Take 4 mg by mouth every 6 (six) hours as needed for nausea or vomiting.   02/17/2023   potassium chloride (MICRO-K) 10 MEQ CR capsule Take 20 mEq by mouth daily.   06/17/2023   rOPINIRole (REQUIP) 0.5 MG tablet Take 0.5 mg by mouth in the morning and at bedtime.   06/17/2023   Simethicone Extra Strength 125 MG CAPS Take 1 capsule by mouth every 6 (six) hours as needed (Gas).   Unk   traMADol (ULTRAM) 50 MG tablet Take 0.5 tablets (25 mg total) by mouth every 8 (eight) hours. 45 tablet 0 06/17/2023   DOXYCYCLINE HYCLATE PO Take 100 mg by mouth in the morning and at bedtime. (Patient not taking: Reported on 06/18/2023)   Completed Course    Assessment: Pharmacy consulted to dose heparin in patient with atrial fibrillation.  Patient is on Eliquis prior to admission with last dose 6/27 @ 2200.  Will need to monitor aPTT until correlation with heparin levels.  aPTT 75 (therapeutic) on heparin 1000 units/hr. No signs of bleeding reported. HL 0.57- therapeutic  Goal of Therapy:  HL 0.3-0.7 Monitor platelets by anticoagulation protocol: Yes   Plan:  Continue heparin at 1000 units/hr  Daily heparin level and CBC F/u transition to PO   Judeth Cornfield, PharmD Clinical Pharmacist 06/21/2023 7:43 AM

## 2023-06-21 NOTE — Progress Notes (Signed)
PROGRESS NOTE  [name] [name] ZOX:[id number] DOB: February 13, 1941 DOA: 06/18/2023 PCP: Sharee Holster, NP  Brief History:  82 year old female with a history of chronic back pain status post fusion, chronic lower extremity edema, coronary artery disease, HFmrEF (50-55%), permanent atrial fibrillation, hypertension, hyperlipidemia, restless leg syndrome, esophageal stricture, paraesophageal hernia status postsurgical repair, depression/anxiety presenting with increasing difficulty swallowing over the past month, worsening over the past 2 days.  He was also been complaining of lower abdominal pain.  Notably, the patient had a paraesophageal hernia repair, Heller myotomy, and gastropexy at [facility] on 02/17/22.  The patient had a complex postoperative history.  She began experiencing dysphagia again at the end of March 2023.  She was readmitted back to [facility] from 03/17/2022 to 04/13/2022.  She had a prolonged hospitalization which was complicated by development of atrial fibrillation with RVR and aspiration pneumonia.  She required intubation because of respiratory failure from her aspiration.  Subsequently, she underwent EGD on 03/27/2022 which showed severe postoperative esophageal narrowing at the GE junction dilated with a 10 to 12 mm CRE dilatation.  There is food noted in the stomach with obstruction views of the cardia.  After the EGD, the patient experienced pharyngoesophageal deficits impacting her swallowing.  After goals of care discussion with the patient's family they elected to proceed with a gastrostomy tube placement which was performed on 04/07/2022.  She was started on enteral feedings which she tolerated and ultimately discharged to a skilled nursing facility on 04/13/2022.   Thereafter, pt was ultimate evaluated by ENT and underwent FEES on 06/16/22 which suggested pt could begin liquid diet.  She continued to follow up with speech therapy with serial MBS studies and her diet was gradually  advanced.  Gastrostomy tube was removed in December 2023, and the patient has been eating by mouth since that period of time.  However over the next several months, she has noted increasing dysphagia type symptoms.  This has been worsening despite crushing her pills and eating softer food.  She has had to basically tolerate what amounts to a full liquid and soft diet.  However in the past few days prior to admission she has had increasing regurgitation, hypersalivation, and dry heaving.  She has had difficulty tolerating her pills.  She has had increasing shortness of breath over the past few days.  She denies any frank chest pain, fevers, chills, diarrhea, hematochezia, melena.  She has had some urinary frequency but denies any frank dysuria.  There is no hematemesis. Because she has had increasing difficulty tolerating pills she has not been able to take her cardiac medications or pain medications. In the ED, the patient was afebrile, but tachycardic in the 140s.  She was hemodynamically stable.  Oxygen saturation was 87% on 2 L.  She was placed on nasal cannula.  WBC 6.5, hemoglobin 9.6, platelets 171.  Sodium 131, potassium 3.7, bicarbonate 24, serum creatinine 0.50.  AST 216, ALT 210, alk phos 50-50, total bili 0.8.  EKG showed atrial fibrillation with RVR.  There is no ST-T wave changes.  CT chest and abdomen showed multiple airspace opacities bilaterally consistent with multifocal pneumonia.  There is moderate renal atrophy.  There is pancreatic atrophy.  There is a periumbilical hernia.  Stomach was within normal limits.  There is no bowel obstruction.  She was started on diltiazem drip, ceftriaxone, azithromycin.   Assessment/Plan: Acute respiratory failure with hypoxia -Secondary to aspiration pneumonia -Stable on  4 L nasal cannula -Presented with tachypnea and hypoxia -repeat CXR since she has had increase oxygen demand   Aspiration pneumonia -continue zosyn -Speech therapy evaluation  appreciated>>may have liquids   Dysphagia -complex hx with paraesophageal hernia repair 02/17/22--see above discussion -GI consult appreciated>>plan for barium esophagram 7/1 or 7/2 -Clear liquids for now if able to tolerate -appreciate speech therapy eval>>may if liquids   Permanent atrial fibrillation with RVR -Echocardiogram--delayed due to continue tachycardia -Continue diltiazem drip>>now rate controlled -TSH--0.937 -Continue IV heparin until until patient is able to tolerate pills   Chronic pain syndrome/chronic back pain/opioid dependence -She is on a fentanyl patch, 25 mcg chronically>>increase to 50 mcg -She is on tramadol for breakthrough pain at SNF -prn IV fentanyl until able to swallow -Continue gabapentin if able to swallow -increased fentanyl to 50 mcg TD patch   Chronic HFimpEF -Appears clinically euvolemic -10/27/2021 echo EF 50-55%, no WMA, trivial MR -Holding furosemide temporarily -7/1 Echo EF 60-65%, no WMA, normal RVF, mod TR; biscupid aortic valve; mild-mod MR   Restless leg syndrome -Restart ropinirole when patient able to tolerate p.o.   GERD -Pantoprazole twice daily IV   Depression/anxiety -Restart fluoxetine 60 mg daily when able to tolerate p.o. -Restart BuSpar once able to tolerate p.o. -pt refuses po intake.  After discussion with daughter>>agrees to try valium IV   Hyponatremia -Secondary to poor solute intake and volume depletion -Judicious IV normal saline  Hypokalemia -replete -mag 2.0               Family Communication:   daughter at bedside 7/1   Consultants:  GI   Code Status:  DNR   DVT Prophylaxis:  IV Heparin      Procedures: As Listed in Progress Note Above   Antibiotics: Zosyn 6/28>>        Subjective: Pt has some sob.  Denies cp, abd pain, n/v/d  Objective: Vitals:   06/21/23 0800 06/21/23 0900 06/21/23 1000 06/21/23 1209  BP: (!) 141/68 (!) 163/64 135/60   Pulse: (!) 37 78 67   Resp: 14 (!) 9  16   Temp:    98.1 F (36.7 C)  TempSrc:    Oral  SpO2: 96% 96% 97%   Weight:        Intake/Output Summary (Last 24 hours) at 06/21/2023 1213 Last data filed at 06/21/2023 1018 Gross per 24 hour  Intake 1212.3 ml  Output 950 ml  Net 262.3 ml   Weight change:  Exam:  General:  Pt is alert, follows commands appropriately, not in acute distress HEENT: No icterus, No thrush, No neck mass, [place]/AT Cardiovascular: IRRR, S1/S2, no rubs, no gallops Respiratory: scattered rhonchi Abdomen: Soft/+BS, non tender, non distended, no guarding Extremities: No edema, No lymphangitis, No petechiae, No rashes, no synovitis   Data Reviewed: I have personally reviewed following labs and imaging studies Basic Metabolic Panel: Recent Labs  Lab 06/18/23 1144 06/19/23 0442 06/20/23 0533 06/21/23 0353  NA 131* 134* 135 136  K 3.7 3.6 3.5 3.4*  CL 97* 102 104 102  CO2 24 23 24  21*  GLUCOSE 130* 89 80 73  BUN 10 11 11 9   CREATININE 0.50 0.59 0.53 0.56  CALCIUM 8.3* 8.2* 8.1* 8.3*  MG  --   --   --  2.0   Liver Function Tests: Recent Labs  Lab 06/18/23 1144 06/19/23 0442 06/20/23 0533 06/21/23 0353  AST 216* 109* 50* 32  ALT 210* 142* 92* 69*  ALKPHOS 250* 194* 158*  153*  BILITOT 0.8 0.6 0.8 0.9  PROT 6.9 6.5 6.0* 5.8*  ALBUMIN 3.4* 3.0* 2.8* 2.7*   No results for input(s): "LIPASE", "AMYLASE" in the last 168 hours. No results for input(s): "AMMONIA" in the last 168 hours. Coagulation Profile: No results for input(s): "INR", "PROTIME" in the last 168 hours. CBC: Recent Labs  Lab 06/15/23 0000 06/18/23 1144 06/19/23 0442 06/20/23 0533 06/21/23 0353  WBC 6.5 6.5 6.1 5.0 4.4  NEUTROABS 4.0 5.0  --   --   --   HGB 11.5 11.6* 11.1* 10.8* 10.7*  HCT 35.5 36.3 35.3* 35.6* 34.7*  MCV 85 87.5 89.1 91.3 92.3  PLT 199 171 166 148* 144*   Cardiac Enzymes: No results for input(s): "CKTOTAL", "CKMB", "CKMBINDEX", "TROPONINI" in the last 168 hours. BNP: Invalid input(s):  "POCBNP" CBG: No results for input(s): "GLUCAP" in the last 168 hours. HbA1C: No results for input(s): "HGBA1C" in the last 72 hours. Urine analysis:    Component Value Date/Time   COLORURINE YELLOW 06/19/2023 1845   APPEARANCEUR HAZY (A) 06/19/2023 1845   APPEARANCEUR Clear 06/21/2018 1248   LABSPEC 1.018 06/19/2023 1845   PHURINE 5.0 06/19/2023 1845   GLUCOSEU NEGATIVE 06/19/2023 1845   HGBUR SMALL (A) 06/19/2023 1845   BILIRUBINUR NEGATIVE 06/19/2023 1845   BILIRUBINUR Negative 06/21/2018 1248   KETONESUR 20 (A) 06/19/2023 1845   PROTEINUR 30 (A) 06/19/2023 1845   UROBILINOGEN 0.2 10/27/2014 0729   NITRITE NEGATIVE 06/19/2023 1845   LEUKOCYTESUR NEGATIVE 06/19/2023 1845   Sepsis Labs: @LABRCNTIP (procalcitonin:4,lacticidven:4) ) Recent Results (from the past 240 hour(s))  Blood culture (routine x 2)     Status: None (Preliminary result)   Collection Time: 06/18/23  2:39 PM   Specimen: BLOOD  Result Value Ref Range Status   Specimen Description BLOOD BLOOD LEFT ARM  Final   Special Requests   Final    BOTTLES DRAWN AEROBIC AND ANAEROBIC Blood Culture adequate volume   Culture   Final    NO GROWTH 3 DAYS Performed at [facility], [address] [place], [place] [address]    Report Status PENDING  Incomplete  Blood culture (routine x 2)     Status: None (Preliminary result)   Collection Time: 06/18/23  2:44 PM   Specimen: BLOOD  Result Value Ref Range Status   Specimen Description BLOOD BLOOD LEFT HAND  Final   Special Requests   Final    BOTTLES DRAWN AEROBIC AND ANAEROBIC Blood Culture adequate volume   Culture   Final    NO GROWTH 3 DAYS Performed at [facility], [address] [place], [place] [address]    Report Status PENDING  Incomplete  Resp panel by RT-PCR (RSV, Flu A&B, Covid) Anterior Nasal Swab     Status: None   Collection Time: 06/19/23  1:08 PM   Specimen: Anterior Nasal Swab  Result Value Ref Range Status   SARS Coronavirus 2 by RT PCR NEGATIVE  NEGATIVE Final    Comment: (NOTE) SARS-CoV-2 target nucleic acids are NOT DETECTED.  The SARS-CoV-2 RNA is generally detectable in upper respiratory specimens during the acute phase of infection. The lowest concentration of SARS-CoV-2 viral copies this assay can detect is 138 copies/mL. A negative result does not preclude SARS-Cov-2 infection and should not be used as the sole basis for treatment or other patient management decisions. A negative result may occur with  improper specimen collection/handling, submission of specimen other than nasopharyngeal swab, presence of viral mutation(s) within the areas targeted by  this assay, and inadequate number of viral copies(<138 copies/mL). A negative result must be combined with clinical observations, patient history, and epidemiological information. The expected result is Negative.  Fact Sheet for Patients:  BloggerCourse.com  Fact Sheet for Healthcare Providers:  SeriousBroker.it  This test is no t yet approved or cleared by the [place] FDA and  has been authorized for detection and/or diagnosis of SARS-CoV-2 by FDA under an Emergency Use Authorization (EUA). This EUA will remain  in effect (meaning this test can be used) for the duration of the COVID-19 declaration under Section 564(b)(1) of the Act, 21 U.S.C.section 360bbb-3(b)(1), unless the authorization is terminated  or revoked sooner.       Influenza A by PCR NEGATIVE NEGATIVE Final   Influenza B by PCR NEGATIVE NEGATIVE Final    Comment: (NOTE) The Xpert Xpress SARS-CoV-2/FLU/RSV plus assay is intended as an aid in the diagnosis of influenza from Nasopharyngeal swab specimens and should not be used as a sole basis for treatment. Nasal washings and aspirates are unacceptable for Xpert Xpress SARS-CoV-2/FLU/RSV testing.  Fact Sheet for Patients: BloggerCourse.com  Fact Sheet for Healthcare  Providers: SeriousBroker.it  This test is not yet approved or cleared by the [place] FDA and has been authorized for detection and/or diagnosis of SARS-CoV-2 by FDA under an Emergency Use Authorization (EUA). This EUA will remain in effect (meaning this test can be used) for the duration of the COVID-19 declaration under Section 564(b)(1) of the Act, 21 U.S.C. section 360bbb-3(b)(1), unless the authorization is terminated or revoked.     Resp Syncytial Virus by PCR NEGATIVE NEGATIVE Final    Comment: (NOTE) Fact Sheet for Patients: BloggerCourse.com  Fact Sheet for Healthcare Providers: SeriousBroker.it  This test is not yet approved or cleared by the [place] FDA and has been authorized for detection and/or diagnosis of SARS-CoV-2 by FDA under an Emergency Use Authorization (EUA). This EUA will remain in effect (meaning this test can be used) for the duration of the COVID-19 declaration under Section 564(b)(1) of the Act, 21 U.S.C. section 360bbb-3(b)(1), unless the authorization is terminated or revoked.  Performed at [facility], [address] [place], [place] [address]   MRSA Next Gen by PCR, Nasal     Status: None   Collection Time: 06/19/23  1:08 PM   Specimen: Nasal Mucosa; Nasal Swab  Result Value Ref Range Status   MRSA by PCR Next Gen NOT DETECTED NOT DETECTED Final    Comment: (NOTE) The GeneXpert MRSA Assay (FDA approved for NASAL specimens only), is one component of a comprehensive MRSA colonization surveillance program. It is not intended to diagnose MRSA infection nor to guide or monitor treatment for MRSA infections. Test performance is not FDA approved in patients less than 42 years old. Performed at [facility], [address] [place], [place] [address]   Respiratory (~20 pathogens) panel by PCR     Status: Abnormal   Collection Time: 06/19/23  1:08 PM   Specimen:  Nasopharyngeal Swab; Respiratory  Result Value Ref Range Status   Adenovirus NOT DETECTED NOT DETECTED Final   Coronavirus 229E NOT DETECTED NOT DETECTED Final    Comment: (NOTE) The Coronavirus on the Respiratory Panel, DOES NOT test for the novel  Coronavirus (2019 nCoV)    Coronavirus HKU1 NOT DETECTED NOT DETECTED Final   Coronavirus NL63 NOT DETECTED NOT DETECTED Final   Coronavirus OC43 NOT DETECTED NOT DETECTED Final   Metapneumovirus NOT DETECTED NOT DETECTED Final   Rhinovirus /  Enterovirus DETECTED (A) NOT DETECTED Final   Influenza A NOT DETECTED NOT DETECTED Final   Influenza B NOT DETECTED NOT DETECTED Final   Parainfluenza Virus 1 NOT DETECTED NOT DETECTED Final   Parainfluenza Virus 2 NOT DETECTED NOT DETECTED Final   Parainfluenza Virus 3 NOT DETECTED NOT DETECTED Final   Parainfluenza Virus 4 NOT DETECTED NOT DETECTED Final   Respiratory Syncytial Virus NOT DETECTED NOT DETECTED Final   Bordetella pertussis NOT DETECTED NOT DETECTED Final   Bordetella Parapertussis NOT DETECTED NOT DETECTED Final   Chlamydophila pneumoniae NOT DETECTED NOT DETECTED Final   Mycoplasma pneumoniae NOT DETECTED NOT DETECTED Final    Comment: Performed at [facility], [address] [place], [place] [address]     Scheduled Meds:  busPIRone  10 mg Oral TID   Chlorhexidine Gluconate Cloth  6 each Topical Daily   fentaNYL  1 patch Transdermal Q72H   lidocaine  1 patch Transdermal Q24H   pantoprazole (PROTONIX) IV  40 mg Intravenous Q12H   Continuous Infusions:  diltiazem (CARDIZEM) infusion 5 mg/hr (06/21/23 1018)   heparin 1,000 Units/hr (06/21/23 1018)   piperacillin-tazobactam (ZOSYN)  IV 12.5 mL/hr at 06/21/23 1018   potassium chloride 10 mEq (06/21/23 1037)    Procedures/Studies: ECHOCARDIOGRAM COMPLETE  Result Date: 06/21/2023    ECHOCARDIOGRAM REPORT   Patient Name:   KINZA LUCHS Siharath Date of Exam: 06/21/2023 Medical Rec #:  [id number]       Height:       67.0 in  Accession #:    [id number]      Weight:       184.1 lb Date of Birth:  Mar 26, 1941        BSA:          1.952 m Patient Age:    81 years        BP:           155/54 mmHg Patient Gender: F               HR:           102 bpm. [facility] Procedure: 2D Echo, Cardiac Doppler and Color Doppler Indications:    Atrial Fibrillation  History:        Patient has prior history of Echocardiogram examinations, most                 recent 10/27/2021. CHF, CAD, Arrythmias:Atrial Fibrillation; Risk                 Factors:Hypertension, Dyslipidemia and Sleep Apnea.  Sonographer:    Mikki Harbor Referring Phys: [phone] Destony Prevost IMPRESSIONS  1. Left ventricular ejection fraction, by estimation, is 60 to 65%. The left ventricle has normal function. The left ventricle has no regional wall motion abnormalities. There is mild left ventricular hypertrophy. Left ventricular diastolic parameters are indeterminate.  2. Right ventricular systolic function is normal. The right ventricular size is normal. There is mildly elevated pulmonary artery systolic pressure.  3. Left atrial size was moderately dilated.  4. Right atrial size was mildly dilated.  5. The mitral valve is abnormal. Mild to moderate mitral valve regurgitation. No evidence of mitral stenosis.  6. The tricuspid valve is abnormal. Tricuspid valve regurgitation is moderate.  7. The aortic valve is bicuspid. There is mild calcification of the aortic valve. There is mild thickening of the aortic valve. Aortic valve regurgitation is not visualized. No aortic stenosis is present.  8.  The inferior vena cava is normal in size with greater than 50% respiratory variability, suggesting right atrial pressure of 3 mmHg. FINDINGS  Left Ventricle: Left ventricular ejection fraction, by estimation, is 60 to 65%. The left ventricle has normal function. The left ventricle has no regional wall motion abnormalities. The left ventricular internal cavity size was normal in size. There  is  mild left ventricular hypertrophy. Left ventricular diastolic parameters are indeterminate. Right Ventricle: The right ventricular size is normal. Right vetricular wall thickness was not well visualized. Right ventricular systolic function is normal. There is mildly elevated pulmonary artery systolic pressure. The tricuspid regurgitant velocity  is 3.21 m/s, and with an assumed right atrial pressure of 3 mmHg, the estimated right ventricular systolic pressure is 44.2 mmHg. Left Atrium: Left atrial size was moderately dilated. Right Atrium: Right atrial size was mildly dilated. Pericardium: There is no evidence of pericardial effusion. Mitral Valve: The mitral valve is abnormal. There is mild thickening of the mitral valve leaflet(s). There is mild calcification of the mitral valve leaflet(s). Mild to moderate mitral valve regurgitation. No evidence of mitral valve stenosis. MV peak gradient, 11.4 mmHg. The mean mitral valve gradient is 3.5 mmHg. Tricuspid Valve: The tricuspid valve is abnormal. Tricuspid valve regurgitation is moderate . No evidence of tricuspid stenosis. Aortic Valve: The aortic valve is bicuspid. There is mild calcification of the aortic valve. There is mild thickening of the aortic valve. There is mild aortic valve annular calcification. Aortic valve regurgitation is not visualized. No aortic stenosis is present. Aortic valve mean gradient measures 7.0 mmHg. Aortic valve peak gradient measures 15.4 mmHg. Aortic valve area, by VTI measures 1.98 cm. Pulmonic Valve: The pulmonic valve was not well visualized. Pulmonic valve regurgitation is not visualized. No evidence of pulmonic stenosis. Aorta: The aortic root is normal in size and structure. Venous: The inferior vena cava is normal in size with greater than 50% respiratory variability, suggesting right atrial pressure of 3 mmHg. IAS/Shunts: No atrial level shunt detected by color flow Doppler.  LEFT VENTRICLE PLAX 2D LVIDd:         5.30 cm  LVIDs:         3.30 cm LV PW:         1.10 cm LV IVS:        1.10 cm LVOT diam:     1.90 cm LV SV:         78 LV SV Index:   40 LVOT Area:     2.84 cm  RIGHT VENTRICLE RV Basal diam:  3.30 cm RV Mid diam:    2.60 cm RV S prime:     12.00 cm/s LEFT ATRIUM             Index        RIGHT ATRIUM           Index LA diam:        4.80 cm 2.46 cm/m   RA Area:     20.20 cm LA Vol (A2C):   97.0 ml 49.69 ml/m  RA Volume:   53.80 ml  27.56 ml/m LA Vol (A4C):   72.0 ml 36.88 ml/m LA Biplane Vol: 84.7 ml 43.39 ml/m  AORTIC VALVE                     PULMONIC VALVE AV Area (Vmax):    1.79 cm      PV Vmax:       1.01 m/s AV  Area (Vmean):   1.96 cm      PV Peak grad:  4.1 mmHg AV Area (VTI):     1.98 cm AV Vmax:           196.50 cm/s AV Vmean:          120.500 cm/s AV VTI:            0.391 m AV Peak Grad:      15.4 mmHg AV Mean Grad:      7.0 mmHg LVOT Vmax:         124.00 cm/s LVOT Vmean:        83.150 cm/s LVOT VTI:          0.274 m LVOT/AV VTI ratio: 0.70  AORTA Ao Root diam: 3.30 cm MITRAL VALVE                TRICUSPID VALVE MV Area (PHT): 4.99 cm     TR Peak grad:   41.2 mmHg MV Area VTI:   2.32 cm     TR Vmax:        321.00 cm/s MV Peak grad:  11.4 mmHg MV Mean grad:  3.5 mmHg     SHUNTS MV Vmax:       1.68 m/s     Systemic VTI:  0.27 m MV Vmean:      82.5 cm/s    Systemic Diam: 1.90 cm MV Decel Time: 152 msec MV E velocity: 127.00 cm/s Dina Rich MD Electronically signed by Dina Rich MD Signature Date/Time: 06/21/2023/10:28:34 AM    Final    US Abdomen Limited RUQ (LIVER/GB)  Result Date: 06/18/2023 CLINICAL DATA:  Elevated transaminase. EXAM: ULTRASOUND ABDOMEN LIMITED RIGHT UPPER QUADRANT COMPARISON:  Ultrasound June 2018.  CT scan earlier 06/18/2023 FINDINGS: Gallbladder: Previous surgical removal. Common bile duct: Diameter: 8 mm. Within normal limits for the patient's age in the post cholecystectomy state. This measured 10 mm in 2018. Liver: Homogeneous hepatic parenchyma. Mild intrahepatic  biliary duct ectasia. Portal vein is patent on color Doppler imaging with normal direction of blood flow towards the liver. Other: Study limited by overlapping bowel gas and soft tissue. IMPRESSION: Previous cholecystectomy. Stable prominent common duct and some mild intrahepatic biliary duct ectasia. Study limited by overlapping bowel gas and soft tissue. Electronically Signed   By: Karen Kays M.D.   On: 06/18/2023 17:25   CT CHEST ABDOMEN PELVIS WO CONTRAST  Result Date: 06/18/2023 CLINICAL DATA:  Dysphagia, acute generalized abdominal pain. EXAM: CT CHEST, ABDOMEN AND PELVIS WITHOUT CONTRAST TECHNIQUE: Multidetector CT imaging of the chest, abdomen and pelvis was performed following the standard protocol without IV contrast. RADIATION DOSE REDUCTION: This exam was performed according to the departmental dose-optimization program which includes automated exposure control, adjustment of the mA and/or kV according to patient size and/or use of iterative reconstruction technique. COMPARISON:  March 17, 2022. FINDINGS: CT CHEST FINDINGS Cardiovascular: Atherosclerosis of thoracic aorta is noted without aneurysm formation. Mild cardiomegaly. No pericardial effusion. Coronary artery calcifications are noted. Mediastinum/Nodes: No enlarged mediastinal, hilar, or axillary lymph nodes. Thyroid gland, trachea, and esophagus demonstrate no significant findings. Lungs/Pleura: No pneumothorax or pleural effusion is noted. Multiple patchy airspace opacities are noted bilaterally consistent with multifocal pneumonia. Mild left posterior basilar subsegmental atelectasis is noted. Musculoskeletal: Surgical posterior fusion is seen involving the thoracic and lumbar spine. No acute osseous abnormality is noted. CT ABDOMEN PELVIS FINDINGS Hepatobiliary: No focal liver abnormality is seen. Status post cholecystectomy. No biliary dilatation. Pancreas: Pancreatic  atrophy is again noted. No acute abnormality seen. Spleen: Normal in  size without focal abnormality. Adrenals/Urinary Tract: Adrenal glands appear normal. Mild right renal atrophy is noted. No renal or ureteral calculi are noted. No definite hydronephrosis or renal obstruction is noted. Urinary bladder is unremarkable. Stomach/Bowel: Stomach is within normal limits. Appendix appears normal. No evidence of bowel wall thickening, distention, or inflammatory changes. Vascular/Lymphatic: Aortic atherosclerosis. No enlarged abdominal or pelvic lymph nodes. Reproductive: Uterus and bilateral adnexa are unremarkable. Other: Small fat containing periumbilical hernia. No ascites is noted. Musculoskeletal: Status post surgical posterior fusion of lumbar spine as well. No acute osseous abnormality is noted. IMPRESSION: Multiple airspace opacities are noted bilaterally consistent with multifocal pneumonia. Mild left posterior basilar subsegmental atelectasis is noted. Mild right renal atrophy is again noted. Pancreatic atrophy is again noted. Small fat containing periumbilical hernia. Status post surgical posterior fusion involving thoracic and lumbar spine. Aortic Atherosclerosis (ICD10-I70.0). Electronically Signed   By: Lupita Raider M.D.   On: 06/18/2023 12:57   DG Chest Port 1 View  Result Date: 06/18/2023 CLINICAL DATA:  82 year old female with shortness of breath, abdominal pain and difficulty swallowing for 1 week. EXAM: PORTABLE CHEST 1 VIEW COMPARISON:  Chest radiographs 02/23/2023 and earlier. FINDINGS: Portable AP upright view at 1220 hours. Stable lung volumes and mediastinal contours. Chronic eventration of the right hemidiaphragm is stable. Improved left lung base ventilation since March. But there remains blunting of the left lateral costophrenic angle, and streaky retrocardiac opacity. No superimposed pneumothorax or pulmonary edema. No other confluent opacity. Extensive posterior spinal fusion hardware redemonstrated, grossly stable. Stable cholecystectomy clips. Paucity  of bowel gas in the visible upper abdomen. IMPRESSION: Improved left lung base ventilation since March, but evidence of a small left pleural effusion and residual streaky, nonspecific retrocardiac opacity. Electronically Signed   By: Odessa Fleming M.D.   On: 06/18/2023 12:29    Catarina Hartshorn, DO  [facility]  If 7PM-7AM, please contact night-coverage [email or web address] Password [email or web address] 06/21/2023, 12:13 PM   LOS: 3 days

## 2023-06-21 NOTE — TOC Initial Note (Addendum)
Transition of Care [facility]) - Initial/Assessment Note    Patient Details  Name: [name] [name] MRN: [id number] Date of Birth: Jun 10, 1941  [facility]) CM/SW Contact:    Annice Needy, LCSW Phone Number: 06/21/2023, 2:56 PM  Clinical Narrative:                 Patient is a LTC resident at [facility], self-propels with a wc. Staff assists with bathing, dressing. She typically toilets independently. She feeds herself. Plan is for her to return to [facility] at d/c. Family did not do a bed hold on patient but that facility said they will take her back if they have availability when she is d/c.   Expected Discharge Plan: Skilled Nursing Facility Barriers to Discharge: Continued Medical Work up   Patient Goals and CMS Choice Patient states their goals for this hospitalization and ongoing recovery are:: return to [facility]          Expected Discharge Plan and Services       Living arrangements for the past 2 months: Skilled Nursing Facility                                      Prior Living Arrangements/Services Living arrangements for the past 2 months: Skilled Nursing Facility Lives with:: Facility Resident Patient language and need for interpreter reviewed:: Yes Do you feel safe going back to the place where you live?: Yes      Need for Family Participation in Patient Care: Yes (Comment) Care giver support system in place?: Yes (comment)   Criminal Activity/Legal Involvement Pertinent to Current Situation/Hospitalization: No - Comment as needed  Activities of Daily Living      Permission Sought/Granted Permission sought to share information with : Family Supports    Share Information with NAME: [name] Conover, daughter           Emotional Assessment       Orientation: : Oriented to Self, Oriented to Place, Oriented to Situation Alcohol / Substance Use: Not Applicable Psych Involvement: No (comment)  Admission diagnosis:  Aspiration pneumonitis (HCC)  [J69.0] Patient Active Problem List   Diagnosis Date Noted   Aspiration pneumonitis (HCC) 06/18/2023   Acute respiratory failure with hypoxia (HCC) 06/18/2023   Chronic non-seasonal allergic rhinitis 06/02/2023   Chronic right hip pain 03/16/2023   Senile purpura (HCC) 03/16/2023   Wheezing 02/17/2023   Bilateral lower extremity edema 12/28/2022   Right sacral radiculopathy 11/09/2022   Severe protein-calorie malnutrition (HCC) 05/25/2022   Generalized osteoarthritis 04/22/2022   Aortic atherosclerosis (HCC) 04/15/2022   Restless leg syndrome 04/15/2022   At risk for adverse drug event 03/05/2022   Major depression, recurrent, chronic (HCC) 03/02/2022   Hyperlipidemia LDL goal <100 03/02/2022   Aspiration pneumonia (HCC) 03/02/2022   Lichen sclerosus et atrophicus 11/26/2020   Osteoarthritis of first carpometacarpal joint of left hand 07/09/2020   Osteoarthritis of first carpometacarpal joint of right hand 07/09/2020   Dysphagia 03/25/2020   HOH (hard of hearing) 03/25/2020   Lichen sclerosus et atrophicus of the vulva 01/03/2019   Long-term current use of opiate analgesic 02/14/2018   Anxiety 07/21/2016   Chronic diastolic CHF (congestive heart failure) (HCC) 07/17/2015   Anemia due to chronic blood loss 07/02/2015   DDD (degenerative disc disease), lumbar 03/22/2015   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   Lumbar scoliosis 03/22/2015   MGUS (monoclonal gammopathy of unknown significance) 01/08/2015  Permanent atrial fibrillation (HCC) 12/22/2014   CAD (coronary artery disease) 12/22/2014   HCAP (healthcare-associated pneumonia) 03/27/2014   S/P spinal fusion 02/23/2014   Chronic pain associated with significant psychosocial dysfunction 02/23/2014   Paraesophageal hiatal hernia 07/06/2013   DEGENERATIVE JOINT DISEASE 04/08/2010   Obesity (BMI 30.0-34.9) 04/04/2009   Irritable bowel syndrome 11/13/2008   ESOPHAGEAL STRICTURE 11/12/2008   GERD 11/12/2008   Paralysis of diaphragm  11/12/2008   Obstructive sleep apnea 05/23/2008   Essential hypertension 05/22/2008   Insomnia 05/22/2008   PCP:  Sharee Holster, NP Pharmacy:   [facility] - [place], [place] - [address] [place] [address] Phone: [phone] Fax: [phone]     Social Determinants of Health (SDOH) Social History: SDOH Screenings   Depression [phone]): Low Risk  (04/22/2023)  Physical Activity: Inactive (04/04/2020)  Social Connections: Socially Isolated (04/04/2020)  Stress: Stress Concern Present (04/04/2020)  Tobacco Use: Low Risk  (06/18/2023)   SDOH Interventions:     Readmission Risk Interventions     No data to display

## 2023-06-21 NOTE — Consult Note (Cosign Needed Addendum)
Consultation Note Date: 06/21/2023   Patient Name: [name] [name]  DOB: 04-Jan-1941  MRN: [id number]  Age / Sex: 82 y.o., female  PCP: Sharee Holster, NP Referring Physician: Catarina Hartshorn, MD  Reason for Consultation: Establishing goals of care  HPI/Patient Profile: 82 y.o. female  with past medical history of chronic back pain status post fusion, chronic lower extremity edema, coronary artery disease, HFmrEF (50-55%), permanent atrial fibrillation, hypertension, hyperlipidemia, restless leg syndrome, esophageal stricture, paraesophageal hernia status postsurgical repair, Heller myotomy, gastropexy, G tube removed Jan 2024, depression/anxiety admitted on 06/18/2023 with dysphagia and poor intake, aspiration pneumonia.   Clinical Assessment and Goals of Care: Consult received and extensive chart review completed. I met today with [name] [name] along with her daughter and son, [name] [name] and [name] [name]. [name] [name] is lying in bed. She is fatigued and moaning. She tells me that she is in constant pain. She tells me she is miserable.   I spoke more with [name] [name] and [name] [name] and [name] [name] shares that they have had many conversations. This seems like deja vu from ~1 year ago. At that time family was encouraging for PEG tube but they understand that this is NOT what [name] [name] wants. They understand that options may be very limited. They are open to hospice options if [name] [name] wants comfort care. We did discuss potential of EGD but considering the risks vs benefits and what she is willing to go through.   We all discussed together and [name] [name] tells [place] that she is tired and she is done. She endorses desire for comfort care. Family tell me that she has expressed this yesterday as well. [name] [name] endorses significant decline over the past month. Family are supportive but also do not want Ms. Koontz to make a rash decision. We spent time discuss quality of  life and if she would be happy with best [facility] scenario. They will like to discuss again tomorrow after BPE results. Anticipate possible shift to comfort over the coming days if [name] [name] remains consistent with her wishes.   All questions/concerns addressed to best of my ability. Emotional support provided.   Primary Decision Maker PATIENT    SUMMARY OF RECOMMENDATIONS   - DNR - Ongoing goals of care conversations - Considering transition to comfort care  Code Status/Advance Care Planning: DNR   Symptom Management:  Ongoing chronic pain. Heat to tender left thumb area improved pain - no bruising but some edema. Fentanyl patch increased 6/29.  Humidity added to oxygen to assist with dryness and irritated nares.   Prognosis:  Overall prognosis poor.   Discharge Planning: To Be Determined      Primary Diagnoses: Present on Admission:  Aspiration pneumonitis (HCC)  Permanent atrial fibrillation (HCC)   I have reviewed the medical record, interviewed the patient and family, and examined the patient. The following aspects are pertinent.  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 difficile colitis 07/21/2022   CAD (coronary artery disease)    a. Abnl nuc 11/2014. Cath 12/2014 - turned down for CABG. Ultimately s/p TTVP, rotational atherectomy, PTCA and stenting of the ostial LCx and left main into the LAD (crush technique), and IVUS of the LAD/Left main. // b. Myoview 11/17: EF 48, poor quality/significant artifact; inf-lateral, inferior ischemia; Intermediate Risk   Chronic atrial fibrillation (HCC)    a.  First noted post-op 9/15 spinal fusion.  She had cardioversion, not on anticoagulation. Fall risk, unsteady. // failed  DCCV // Holter 10/17: AFib, Avg HR 97, PVCs, no other arrhythmia   Chronic back pain greater than 3 months duration    a. spinal stenosis.  Spinal fusion with rods in 2/15 at [facility] spinal fusion 9/15.   Chronic diastolic CHF    Echo 2/17:  EF 50-55%, trivial AI, midl MR, mod LAE, PASP 37 mmHg // Echocardiogram 03/2020: EF 45-50, no RWMA, mild LVH, mild reduced RVSF, mildly elevated PASP (RVSP 38.3), mild LAE, mild MR, mild to mod TR, trivial AI // Echo 9/21: EF 45, mild LVH, mildly reduced RV SF, moderate LAE, mild RAE, mild MR, mild AI      Circadian rhythm sleep disorder    CTS (carpal tunnel syndrome)    Deficiency anemia 11/10/2014   Depression    Diarrhea    Diverticulitis of colon    Esophageal stricture    Gastritis    Gastroesophageal hernia 07/06/2013   GERD (gastroesophageal reflux disease)    Hiatal hernia    History of blood transfusion    "most of them related to OR's"    HTN (hypertension)    Hyperlipidemia    IBS (irritable bowel syndrome)    Incontinence 10/13/2012   Insomnia    Ischemic chest pain (HCC)    Memory disorder 12/04/2014   Metabolic syndrome 04/24/2014   MGUS (monoclonal gammopathy of unknown significance) dx'd 11/2014   a. Neg BMB 11/2014.   Multiple falls    Obesity    Obstructive sleep apnea    "have mask; don't wear it" (01/07/2015)   Orthostasis    PAT (paroxysmal atrial tachycardia)    Personal history of colonic polyps 10/25/2011 & 12/02/11   not retrieved Dr Victorino Dike & tubular adenomas   Pneumonia 03/2014   Sinus bradycardia    a. Baseline HR 50s-60s.   Stroke [facility]) early 2000's   "small"; denies residual on 01/07/2015)   Social History   Socioeconomic History   Marital status: Widowed    Spouse name: Not on file   Number of children: 2   Years of education: HS   Highest education level: Not on file  Occupational History   Occupation: Comptroller- retired    Comment: retired   Occupation: retired  Tobacco Use    Smoking status: Never   Smokeless tobacco: Never  Vaping Use   Vaping Use: Never used  Substance and Sexual Activity   Alcohol use: No    Comment: 01/07/2015 "glass of wine at Christmas, maybe"   Drug use: No   Sexual activity: Not Currently  Other Topics Concern   Not on file  Social History Narrative   Patient is right handed   Long term resident of [facility]    Social Determinants of Health   Financial Resource Strain: Not on file  Food Insecurity: Not on file  Transportation Needs: Not on file  Physical Activity: Inactive (04/04/2020)   Exercise Vital Sign    Days of Exercise per Week: 0 days    Minutes of Exercise per Session: 0 min  Stress: Stress Concern Present (04/04/2020)   [employer] of Occupational Health - Occupational Stress Questionnaire    Feeling of Stress : To some extent  Social Connections: Socially Isolated (04/04/2020)   Social Connection and Isolation Panel [NHANES]    Frequency of Communication with Friends and Family: Three times a week    Frequency of Social Gatherings with Friends and Family: Once a week    Attends Religious Services: Never    Database administrator or Organizations: No    Attends Banker Meetings: Never    Marital Status: Widowed   Family History  Problem Relation Age of Onset   Coronary artery disease Father    Peripheral vascular disease Father    Coronary artery disease Mother    Coronary artery disease Brother    Colon cancer Sister    Emphysema Sister    Sleep apnea Son    Colon cancer Sister        spread to her brain   Emphysema Brother    Dementia Neg Hx    Scheduled Meds:  busPIRone  10 mg Oral TID   Chlorhexidine Gluconate Cloth  6 each Topical Daily   fentaNYL  1 patch Transdermal Q72H   lidocaine  1 patch Transdermal Q24H   pantoprazole (PROTONIX) IV  40 mg Intravenous Q12H   Continuous Infusions:  diltiazem (CARDIZEM) infusion 5 mg/hr (06/21/23 1018)   heparin 1,000 Units/hr (06/21/23 1018)    piperacillin-tazobactam (ZOSYN)  IV 12.5 mL/hr at 06/21/23 1018   PRN Meds:.acetaminophen **OR** acetaminophen, diazepam, fentaNYL (SUBLIMAZE) injection, ondansetron **OR** ondansetron (ZOFRAN) IV Allergies  Allergen Reactions   Buprenorphine Hcl Itching   Cymbalta [Duloxetine Hcl] Other (See Comments)    Other reaction(s): Other (See Comments) Personality changes - crying  2014   Morphine And Codeine Itching   Oxycodone-Acetaminophen Other (See Comments)    Personality changes    Valium [Diazepam] Other (See Comments)    Personality changes - "in another world" ;  Hallucinations  2015   Statins Other (See Comments)    Other reaction(s): Other (See Comments) Muscle weakness Muscle weakness   Morphine Itching   Altace [Ramipril] Other (See Comments)    unknown   Floxin [Ofloxacin] Other (See Comments)    unknown   Hydromorphone Other (See Comments)    Other reaction(s): Confusion (intolerance) unknown   Lovaza [Omega-3-Acid Ethyl Esters] Other (See Comments)    Muscle weakness    Trilipix [Choline Fenofibrate] Other (See Comments)    Muscle weakness    Zetia [Ezetimibe] Other (See Comments)    Muscle weakness    Review of Systems  Constitutional:  Positive for activity change, appetite change and fatigue.  Musculoskeletal:  Positive for arthralgias and back pain.  Neurological:  Positive for weakness.  Psychiatric/Behavioral:  The patient is nervous/anxious.     Physical Exam Vitals and nursing note reviewed.  Constitutional:      Appearance: She is ill-appearing.  Cardiovascular:     Rate and Rhythm: Rhythm irregularly irregular.  Pulmonary:     Effort: No tachypnea, accessory muscle usage or respiratory distress.     Comments: 5L [place]; wet cough with just a few drops of water off end of straw Abdominal:     Palpations:  Abdomen is soft.  Neurological:     Mental Status: She is alert and oriented to person, place, and time.  Psychiatric:        Mood and Affect:  Affect is tearful.     Vital Signs: BP (!) 171/70   Pulse 83   Temp 98.1 F (36.7 C) (Oral)   Resp 10   Wt 83.5 kg   SpO2 97%   BMI 28.83 kg/m  Pain Scale: 0-10 POSS *See Group Information*: 2-Acceptable,Slightly drowsy, easily aroused Pain Score: 0-No pain   SpO2: SpO2: 97 % O2 Device:SpO2: 97 % O2 Flow Rate: .   IO: Intake/output summary:  Intake/Output Summary (Last 24 hours) at 06/21/2023 1427 Last data filed at 06/21/2023 1018 Gross per 24 hour  Intake 727.81 ml  Output 950 ml  Net -222.19 ml    LBM: Last BM Date : 06/17/23 Baseline Weight: Weight: 83.5 kg Most recent weight: Weight: 83.5 kg     Palliative Assessment/Data:      Time Total: 80 min  Greater than 50%  of this time was spent counseling and coordinating care related to the above assessment and plan.  Signed by: Yong Channel, NP Palliative Medicine Team Pager # [phone] (M-F 8a-5p) Team Phone # [phone] (Nights/Weekends)

## 2023-06-21 NOTE — Progress Notes (Signed)
Gastroenterology Progress Note   Primary Care Physician:  Sharee Holster, NP Primary Gastroenterologist:  [facility]  Patient ID: [name] [name]; [id number]; 1941/05/01    Subjective   Difficult historian. Wants ice chips. Tearful. Complaining of wrist pain. No abdominal pain, N/V. NPO for BPE today.     Objective   Vital signs in last 24 hours Temp:  [97.9 F (36.6 C)-98.7 F (37.1 C)] 97.9 F (36.6 C) (06/30 2200) Pulse Rate:  [37-95] 67 (07/01 1000) Resp:  [9-18] 16 (07/01 1000) BP: (104-163)/(39-75) 135/60 (07/01 1000) SpO2:  [93 %-99 %] 97 % (07/01 1000) Last BM Date : 06/17/23  Physical Exam General:   Alert and oriented to person. Tearful, agitated Head:  Normocephalic and atraumatic. Abdomen:  Bowel sounds present, soft, non-tender, non-distended.  Neurologic:  Alert and  oriented to person, year, situation  Intake/Output from previous day: 06/30 0701 - 07/01 0700 In: 2544.7 [P.O.:180; I.V.:2164.9; IV Piggyback:199.8] Out: 950 [Urine:950] Intake/Output this shift: Total I/O In: 134.7 [I.V.:86; IV Piggyback:48.7] Out: -   Lab Results  Recent Labs    06/19/23 0442 06/20/23 0533 06/21/23 0353  WBC 6.1 5.0 4.4  HGB 11.1* 10.8* 10.7*  HCT 35.3* 35.6* 34.7*  PLT 166 148* 144*   BMET Recent Labs    06/19/23 0442 06/20/23 0533 06/21/23 0353  NA 134* 135 136  K 3.6 3.5 3.4*  CL 102 104 102  CO2 23 24 21*  GLUCOSE 89 80 73  BUN 11 11 9   CREATININE 0.59 0.53 0.56  CALCIUM 8.2* 8.1* 8.3*   LFT Recent Labs    06/19/23 0442 06/20/23 0533 06/21/23 0353  PROT 6.5 6.0* 5.8*  ALBUMIN 3.0* 2.8* 2.7*  AST 109* 50* 32  ALT 142* 92* 69*  ALKPHOS 194* 158* 153*  BILITOT 0.6 0.8 0.9    Hepatitis Panel Recent Labs    06/18/23 1741  HEPBSAG NON REACTIVE  HCVAB NON REACTIVE  HEPAIGM NON REACTIVE  HEPBIGM NON REACTIVE    Studies/Results ECHOCARDIOGRAM COMPLETE  Result Date: 06/21/2023    ECHOCARDIOGRAM REPORT   Patient Name:    [name] [name] Kamer Date of Exam: 06/21/2023 Medical Rec #:  [id number]       Height:       67.0 in Accession #:    [id number]      Weight:       184.1 lb Date of Birth:  1941-04-12        BSA:          1.952 m Patient Age:    81 years        BP:           155/54 mmHg Patient Gender: F               HR:           102 bpm. [facility] Procedure: 2D Echo, Cardiac Doppler and Color Doppler Indications:    Atrial Fibrillation  History:        Patient has prior history of Echocardiogram examinations, most                 recent 10/27/2021. CHF, CAD, Arrythmias:Atrial Fibrillation; Risk                 Factors:Hypertension, Dyslipidemia and Sleep Apnea.  Sonographer:    Mikki Harbor Referring Phys: [phone] DAVID TAT IMPRESSIONS  1. Left ventricular ejection fraction, by estimation, is 60 to 65%. The left ventricle  has normal function. The left ventricle has no regional wall motion abnormalities. There is mild left ventricular hypertrophy. Left ventricular diastolic parameters are indeterminate.  2. Right ventricular systolic function is normal. The right ventricular size is normal. There is mildly elevated pulmonary artery systolic pressure.  3. Left atrial size was moderately dilated.  4. Right atrial size was mildly dilated.  5. The mitral valve is abnormal. Mild to moderate mitral valve regurgitation. No evidence of mitral stenosis.  6. The tricuspid valve is abnormal. Tricuspid valve regurgitation is moderate.  7. The aortic valve is bicuspid. There is mild calcification of the aortic valve. There is mild thickening of the aortic valve. Aortic valve regurgitation is not visualized. No aortic stenosis is present.  8. The inferior vena cava is normal in size with greater than 50% respiratory variability, suggesting right atrial pressure of 3 mmHg. FINDINGS  Left Ventricle: Left ventricular ejection fraction, by estimation, is 60 to 65%. The left ventricle has normal function. The left ventricle has no regional  wall motion abnormalities. The left ventricular internal cavity size was normal in size. There is  mild left ventricular hypertrophy. Left ventricular diastolic parameters are indeterminate. Right Ventricle: The right ventricular size is normal. Right vetricular wall thickness was not well visualized. Right ventricular systolic function is normal. There is mildly elevated pulmonary artery systolic pressure. The tricuspid regurgitant velocity  is 3.21 m/s, and with an assumed right atrial pressure of 3 mmHg, the estimated right ventricular systolic pressure is 44.2 mmHg. Left Atrium: Left atrial size was moderately dilated. Right Atrium: Right atrial size was mildly dilated. Pericardium: There is no evidence of pericardial effusion. Mitral Valve: The mitral valve is abnormal. There is mild thickening of the mitral valve leaflet(s). There is mild calcification of the mitral valve leaflet(s). Mild to moderate mitral valve regurgitation. No evidence of mitral valve stenosis. MV peak gradient, 11.4 mmHg. The mean mitral valve gradient is 3.5 mmHg. Tricuspid Valve: The tricuspid valve is abnormal. Tricuspid valve regurgitation is moderate . No evidence of tricuspid stenosis. Aortic Valve: The aortic valve is bicuspid. There is mild calcification of the aortic valve. There is mild thickening of the aortic valve. There is mild aortic valve annular calcification. Aortic valve regurgitation is not visualized. No aortic stenosis is present. Aortic valve mean gradient measures 7.0 mmHg. Aortic valve peak gradient measures 15.4 mmHg. Aortic valve area, by VTI measures 1.98 cm. Pulmonic Valve: The pulmonic valve was not well visualized. Pulmonic valve regurgitation is not visualized. No evidence of pulmonic stenosis. Aorta: The aortic root is normal in size and structure. Venous: The inferior vena cava is normal in size with greater than 50% respiratory variability, suggesting right atrial pressure of 3 mmHg. IAS/Shunts: No  atrial level shunt detected by color flow Doppler.  LEFT VENTRICLE PLAX 2D LVIDd:         5.30 cm LVIDs:         3.30 cm LV PW:         1.10 cm LV IVS:        1.10 cm LVOT diam:     1.90 cm LV SV:         78 LV SV Index:   40 LVOT Area:     2.84 cm  RIGHT VENTRICLE RV Basal diam:  3.30 cm RV Mid diam:    2.60 cm RV S prime:     12.00 cm/s LEFT ATRIUM  Index        RIGHT ATRIUM           Index LA diam:        4.80 cm 2.46 cm/m   RA Area:     20.20 cm LA Vol (A2C):   97.0 ml 49.69 ml/m  RA Volume:   53.80 ml  27.56 ml/m LA Vol (A4C):   72.0 ml 36.88 ml/m LA Biplane Vol: 84.7 ml 43.39 ml/m  AORTIC VALVE                     PULMONIC VALVE AV Area (Vmax):    1.79 cm      PV Vmax:       1.01 m/s AV Area (Vmean):   1.96 cm      PV Peak grad:  4.1 mmHg AV Area (VTI):     1.98 cm AV Vmax:           196.50 cm/s AV Vmean:          120.500 cm/s AV VTI:            0.391 m AV Peak Grad:      15.4 mmHg AV Mean Grad:      7.0 mmHg LVOT Vmax:         124.00 cm/s LVOT Vmean:        83.150 cm/s LVOT VTI:          0.274 m LVOT/AV VTI ratio: 0.70  AORTA Ao Root diam: 3.30 cm MITRAL VALVE                TRICUSPID VALVE MV Area (PHT): 4.99 cm     TR Peak grad:   41.2 mmHg MV Area VTI:   2.32 cm     TR Vmax:        321.00 cm/s MV Peak grad:  11.4 mmHg MV Mean grad:  3.5 mmHg     SHUNTS MV Vmax:       1.68 m/s     Systemic VTI:  0.27 m MV Vmean:      82.5 cm/s    Systemic Diam: 1.90 cm MV Decel Time: 152 msec MV E velocity: 127.00 cm/s Dina Rich MD Electronically signed by Dina Rich MD Signature Date/Time: 06/21/2023/10:28:34 AM    Final    US Abdomen Limited RUQ (LIVER/GB)  Result Date: 06/18/2023 CLINICAL DATA:  Elevated transaminase. EXAM: ULTRASOUND ABDOMEN LIMITED RIGHT UPPER QUADRANT COMPARISON:  Ultrasound June 2018.  CT scan earlier 06/18/2023 FINDINGS: Gallbladder: Previous surgical removal. Common bile duct: Diameter: 8 mm. Within normal limits for the patient's age in the post cholecystectomy  state. This measured 10 mm in 2018. Liver: Homogeneous hepatic parenchyma. Mild intrahepatic biliary duct ectasia. Portal vein is patent on color Doppler imaging with normal direction of blood flow towards the liver. Other: Study limited by overlapping bowel gas and soft tissue. IMPRESSION: Previous cholecystectomy. Stable prominent common duct and some mild intrahepatic biliary duct ectasia. Study limited by overlapping bowel gas and soft tissue. Electronically Signed   By: Karen Kays M.D.   On: 06/18/2023 17:25   CT CHEST ABDOMEN PELVIS WO CONTRAST  Result Date: 06/18/2023 CLINICAL DATA:  Dysphagia, acute generalized abdominal pain. EXAM: CT CHEST, ABDOMEN AND PELVIS WITHOUT CONTRAST TECHNIQUE: Multidetector CT imaging of the chest, abdomen and pelvis was performed following the standard protocol without IV contrast. RADIATION DOSE REDUCTION: This exam was performed according to the departmental dose-optimization program which includes automated exposure  control, adjustment of the mA and/or kV according to patient size and/or use of iterative reconstruction technique. COMPARISON:  March 17, 2022. FINDINGS: CT CHEST FINDINGS Cardiovascular: Atherosclerosis of thoracic aorta is noted without aneurysm formation. Mild cardiomegaly. No pericardial effusion. Coronary artery calcifications are noted. Mediastinum/Nodes: No enlarged mediastinal, hilar, or axillary lymph nodes. Thyroid gland, trachea, and esophagus demonstrate no significant findings. Lungs/Pleura: No pneumothorax or pleural effusion is noted. Multiple patchy airspace opacities are noted bilaterally consistent with multifocal pneumonia. Mild left posterior basilar subsegmental atelectasis is noted. Musculoskeletal: Surgical posterior fusion is seen involving the thoracic and lumbar spine. No acute osseous abnormality is noted. CT ABDOMEN PELVIS FINDINGS Hepatobiliary: No focal liver abnormality is seen. Status post cholecystectomy. No biliary  dilatation. Pancreas: Pancreatic atrophy is again noted. No acute abnormality seen. Spleen: Normal in size without focal abnormality. Adrenals/Urinary Tract: Adrenal glands appear normal. Mild right renal atrophy is noted. No renal or ureteral calculi are noted. No definite hydronephrosis or renal obstruction is noted. Urinary bladder is unremarkable. Stomach/Bowel: Stomach is within normal limits. Appendix appears normal. No evidence of bowel wall thickening, distention, or inflammatory changes. Vascular/Lymphatic: Aortic atherosclerosis. No enlarged abdominal or pelvic lymph nodes. Reproductive: Uterus and bilateral adnexa are unremarkable. Other: Small fat containing periumbilical hernia. No ascites is noted. Musculoskeletal: Status post surgical posterior fusion of lumbar spine as well. No acute osseous abnormality is noted. IMPRESSION: Multiple airspace opacities are noted bilaterally consistent with multifocal pneumonia. Mild left posterior basilar subsegmental atelectasis is noted. Mild right renal atrophy is again noted. Pancreatic atrophy is again noted. Small fat containing periumbilical hernia. Status post surgical posterior fusion involving thoracic and lumbar spine. Aortic Atherosclerosis (ICD10-I70.0). Electronically Signed   By: Lupita Raider M.D.   On: 06/18/2023 12:57   DG Chest Port 1 View  Result Date: 06/18/2023 CLINICAL DATA:  83 year old female with shortness of breath, abdominal pain and difficulty swallowing for 1 week. EXAM: PORTABLE CHEST 1 VIEW COMPARISON:  Chest radiographs 02/23/2023 and earlier. FINDINGS: Portable AP upright view at 1220 hours. Stable lung volumes and mediastinal contours. Chronic eventration of the right hemidiaphragm is stable. Improved left lung base ventilation since March. But there remains blunting of the left lateral costophrenic angle, and streaky retrocardiac opacity. No superimposed pneumothorax or pulmonary edema. No other confluent opacity. Extensive  posterior spinal fusion hardware redemonstrated, grossly stable. Stable cholecystectomy clips. Paucity of bowel gas in the visible upper abdomen. IMPRESSION: Improved left lung base ventilation since March, but evidence of a small left pleural effusion and residual streaky, nonspecific retrocardiac opacity. Electronically Signed   By: Odessa Fleming M.D.   On: 06/18/2023 12:29    Assessment  82 y.o. female with complex past medical history including paraesophageal hernia repair, Heller myotomy, gastropexy at [facility] in 2023, unable to exclude achalasia and required G tube in past but removed Jan 2024, admitted with acute respiratory failure secondary to aspiration pneumonia and GI consulted due to worsening dysphagia.   Speech is following and has noted guarding behaviors, fearful of swallowing, and notable oropharyngeal component. Suspected multifactorial etiology to dysphagia with plans for BPE at 1400 today. If EGD is needed, will likely need to be transferred due to multimorbidities.   Elevated LFTs: dating back at least to March 2023. Viral hepatitis panel negative. [place] on file this admission. Recommend further evaluation as outpatient with primary GI.    Plan / Recommendations  BPE today Follow HFP: recommend outpatient evaluation with primary GI PPI IV BID Further recommendations following  BPE    LOS: 3 days    06/21/2023, 12:07 PM  Gelene Mink, PhD, ANP-BC [facility]

## 2023-06-21 DEATH — deceased

## 2023-06-22 DIAGNOSIS — Z515 Encounter for palliative care: Secondary | ICD-10-CM | POA: Diagnosis not present

## 2023-06-22 DIAGNOSIS — R1319 Other dysphagia: Secondary | ICD-10-CM

## 2023-06-22 DIAGNOSIS — J690 Pneumonitis due to inhalation of food and vomit: Secondary | ICD-10-CM | POA: Diagnosis not present

## 2023-06-22 DIAGNOSIS — I4821 Permanent atrial fibrillation: Secondary | ICD-10-CM | POA: Diagnosis not present

## 2023-06-22 DIAGNOSIS — J9601 Acute respiratory failure with hypoxia: Secondary | ICD-10-CM | POA: Diagnosis not present

## 2023-06-22 DIAGNOSIS — Z7189 Other specified counseling: Secondary | ICD-10-CM | POA: Diagnosis not present

## 2023-06-22 LAB — BASIC METABOLIC PANEL
Anion gap: 14 (ref 5–15)
BUN: 7 mg/dL — ABNORMAL LOW (ref 8–23)
CO2: 24 mmol/L (ref 22–32)
Calcium: 8.5 mg/dL — ABNORMAL LOW (ref 8.9–10.3)
Chloride: 98 mmol/L (ref 98–111)
Creatinine, Ser: 0.64 mg/dL (ref 0.44–1.00)
GFR, Estimated: >60 mL/min (ref >60)
Glucose, Bld: 92 mg/dL (ref 70–99)
Potassium: 3.2 mmol/L — ABNORMAL LOW (ref 3.5–5.1)
Sodium: 136 mmol/L (ref 135–145)

## 2023-06-22 LAB — HEPATIC FUNCTION PANEL
ALT: 59 U/L — ABNORMAL HIGH (ref 0–44)
AST: 28 U/L (ref 15–41)
Albumin: 3.1 g/dL — ABNORMAL LOW (ref 3.5–5.0)
Alkaline Phosphatase: 163 U/L — ABNORMAL HIGH (ref 38–126)
Bilirubin, Direct: 0.1 mg/dL (ref 0.0–0.2)
Indirect Bilirubin: 1 mg/dL — ABNORMAL HIGH (ref 0.3–0.9)
Total Bilirubin: 1.1 mg/dL (ref 0.3–1.2)
Total Protein: 6.6 g/dL (ref 6.5–8.1)

## 2023-06-22 LAB — CBC
HCT: 39.1 % (ref 36.0–46.0)
Hemoglobin: 12.2 g/dL (ref 12.0–15.0)
MCH: 27.9 pg (ref 26.0–34.0)
MCHC: 31.2 g/dL (ref 30.0–36.0)
MCV: 89.3 fL (ref 80.0–100.0)
Platelets: 156 10*3/uL (ref 150–400)
RBC: 4.38 MIL/uL (ref 3.87–5.11)
RDW: 14.8 % (ref 11.5–15.5)
WBC: 4.6 10*3/uL (ref 4.0–10.5)
nRBC: 0 % (ref 0.0–0.2)

## 2023-06-22 LAB — HEPARIN LEVEL (UNFRACTIONATED): Heparin Unfractionated: 0.34 IU/mL (ref 0.30–0.70)

## 2023-06-22 LAB — MAGNESIUM: Magnesium: 1.8 mg/dL (ref 1.7–2.4)

## 2023-06-22 MED ORDER — HYDROMORPHONE BOLUS VIA INFUSION
0.5000 mg | INTRAVENOUS | Status: DC | PRN
  Administered 2023-06-23 – 2023-06-29 (×12): 0.5 mg via INTRAVENOUS

## 2023-06-22 MED ORDER — SODIUM CHLORIDE 0.9 % IV SOLN
0.5000 mg/h | INTRAVENOUS | Status: AC
  Administered 2023-06-22: 1 mg/h via INTRAVENOUS
  Administered 2023-06-22: 0.5 mg/h via INTRAVENOUS
  Administered 2023-06-24: 1 mg/h via INTRAVENOUS
  Filled 2023-06-22 (×3): qty 5

## 2023-06-22 NOTE — Progress Notes (Signed)
Palliative:  HPI: 82 y.o. female  with past medical history of chronic back pain status post fusion, chronic lower extremity edema, coronary artery disease, HFmrEF (50-55%), permanent atrial fibrillation, hypertension, hyperlipidemia, restless leg syndrome, esophageal stricture, paraesophageal hernia status postsurgical repair, Heller myotomy, gastropexy, G tube removed Jan 2024, depression/anxiety admitted on 06/18/2023 with dysphagia and poor intake, aspiration pneumonia.   I met today with [name] [name] and spoke privately with Dr. Arbutus Leas and daughter, [name] [name]. They have just met and [name] [name] wishes for comfort care. Family are understandably tearful but supportive of [name] [name]'s wishes. [name] [name] understands that she has suffered. She does not want her to be in pain. We discussed plan to start opioid infusion to better manage pain and once family is able to gather and visit we will ensure [name] [name]'s comfort and then transition to full comfort care and stop cardiac infusions and medications not adding to comfort. We reassured [name] [name] that we will utilize medication to ensure comfort so her mother does not feel any pain or discomfort from her heart rate. [name] [name] will notify her brother and uncle (he is currently at the beach) so that they can make their way here to visit with [name] [name].   Update: I returned to bedside. [name] [name] and [name] [name] are having good talks about life and regrets but knowing we are all human and none of [place] are perfect. We reviewed plan to better manage pain and then full comfort once family arrives. [name] [name] tells me that this may be 7/3 or 7/4. I reassured them that I will be here over the next couple days for support and assistance with symptom management. All agree with plan at this time.   All questions/concerns addressed. Emotional support provided.   Exam: Alert, awake, oriented. Tearful. Ill-appearing. Irreg, irreg HR. Breathing regular, unlabored on 5L [place]. Abd soft. Generalized weakness and  fatigue.   Plan: - DNR - Pain: dilaudid 0.5 mg/hr and titrate up as needed for adequate relief. Bolus PRN.  - Transition to full comfort care once family able to arrive from out of town.  - Anticipate hospital death.   55 min  Yong Channel, NP Palliative Medicine Team Pager [phone] (Please see [email or web address] for schedule) Team Phone [phone]    Greater than 50%  of this time was spent counseling and coordinating care related to the above assessment and plan

## 2023-06-22 NOTE — Progress Notes (Signed)
Subjective: Spoke with patient's daughter at bedside who reports gradual worsening dysphagia over the last several months, but significant worsening over the last month with patient essentially regurgitating everything she eats or drinks. Patient states foods would get stuck in her lower esophagus and she was able to get it to gown down at times with drinking something with it. Reports liquids sometimes also regurgitate.  Daughter reports 4-5 episodes of pneumonia this year.  Daughter reports her mom really doesn't want to have a feeding tube re-placed due to constant diarrhea with this.  States they tried various types of tube feeding, but diarrhea was an ongoing issue.  States patient has chronic issues with alternating constipation and diarrhea, but primarily with diarrhea.  History of cholecystectomy.  She is never been on any medications for this chronically.  Not sure if anything was tried to slow down the diarrhea with tube feeds.  Patient reports she is better in regards to the feer of swallowing. States she was scare about getting choked. She is tolerating ice chips well at this time.   Patient/daughter state they do not want to go back to [facility] as they did not have a good experience there. States the communication was poor.   Aside from dysphagia, patient has no complaints for me. Denies abdominal pain, nausea, vomiting.   Objective: Vital signs in last 24 hours: Temp:  [97.4 F (36.3 C)-98.9 F (37.2 C)] 98.9 F (37.2 C) (07/02 1100) Pulse Rate:  [65-98] 98 (07/02 1100) Resp:  [8-22] 12 (07/02 1100) BP: (114-167)/(46-105) 150/66 (07/02 1100) SpO2:  [88 %-98 %] 95 % (07/02 1100) Last BM Date : 06/17/23 General:  Ill appearing, resting with eyes closed in no acute distress, arouses easily, alert and oriented to person and situation. On 3L [place].  Head:  Normocephalic and atraumatic. Abdomen:  Bowel sounds present, soft, non-tender, non-distended.  Extremities:  Without  edema.  Intake/Output from previous day: 07/01 0701 - 07/02 0700 In: 542.6 [I.V.:392.6; IV Piggyback:150] Out: 2300 [Urine:2300] Intake/Output this shift: No intake/output data recorded.  Lab Results: Recent Labs    06/20/23 0533 06/21/23 0353 06/22/23 0543  WBC 5.0 4.4 4.6  HGB 10.8* 10.7* 12.2  HCT 35.6* 34.7* 39.1  PLT 148* 144* 156   BMET Recent Labs    06/20/23 0533 06/21/23 0353 06/22/23 0543  NA 135 136 136  K 3.5 3.4* 3.2*  CL 104 102 98  CO2 24 21* 24  GLUCOSE 80 73 92  BUN 11 9 7*  CREATININE 0.53 0.56 0.64  CALCIUM 8.1* 8.3* 8.5*   LFT Recent Labs    06/20/23 0533 06/21/23 0353  PROT 6.0* 5.8*  ALBUMIN 2.8* 2.7*  AST 50* 32  ALT 92* 69*  ALKPHOS 158* 153*  BILITOT 0.8 0.9   Studies/Results: DG CHEST PORT 1 VIEW  Result Date: 06/21/2023 CLINICAL DATA:  Dyspnea EXAM: PORTABLE CHEST 1 VIEW COMPARISON:  06/18/2023, CT 06/18/2023 FINDINGS: Central airways thickening. Increasing airspace disease at left base. Mild cardiomegaly. Aortic atherosclerosis. Extensive posterior spinal rods and fixating screws IMPRESSION: Central airways thickening with increasing left base airspace disease, possible pneumonia or aspiration. Probable small left effusion Electronically Signed   By: Jasmine Pang M.D.   On: 06/21/2023 17:00   DG ESOPHAGUS W SINGLE CM (SOL OR THIN BA)  Result Date: 06/21/2023 CLINICAL DATA:  Dysphagia. History of esophageal stricture. Gastroesophageal reflux disease. EXAM: ESOPHOGRAM/BARIUM SWALLOW TECHNIQUE: Single contrast examination was performed using  thin barium. FLUOROSCOPY: Radiation Exposure Index (as  provided by the fluoroscopic device): 63.0 mGy Kerma COMPARISON:  06/18/2023 CT , FINDINGS: Severely limited exam. The patient is immobile and struggles with multiple consecutive swallows. Further thoracic spine fixation hardware projects over the esophagus when the patient is supine and minimally oblique. Patient not able to tolerate lateral  positioning. Esophageal motility could not be evaluated. No gross upper esophageal narrowing to confirm stricture. No large hiatal hernia. An area of at least moderate narrowing is suspected at the gastroesophageal junction, including on 283/4. The patient does not take tablets so 13 mm barium tablet was not given. IMPRESSION: 1. Severely limited exam, including patient immobility, thoracic spine hardware projecting over the esophagus, and inability to perform consecutive swallows. 2. Suspicion of at least a moderate stricture at the gastroesophageal junction. 3. Consider repeat exam, when patient is clinically stable (i.e. As an outpatient) and can be evaluated from a lateral projection. Electronically Signed   By: Jeronimo Greaves M.D.   On: 06/21/2023 15:05   ECHOCARDIOGRAM COMPLETE  Result Date: 06/21/2023    ECHOCARDIOGRAM REPORT   Patient Name:   ROHINI CASASANTA Nettleton Date of Exam: 06/21/2023 Medical Rec #:  [id number]       Height:       67.0 in Accession #:    [id number]      Weight:       184.1 lb Date of Birth:  05-16-1941        BSA:          1.952 m Patient Age:    81 years        BP:           155/54 mmHg Patient Gender: F               HR:           102 bpm. [facility] Procedure: 2D Echo, Cardiac Doppler and Color Doppler Indications:    Atrial Fibrillation  History:        Patient has prior history of Echocardiogram examinations, most                 recent 10/27/2021. CHF, CAD, Arrythmias:Atrial Fibrillation; Risk                 Factors:Hypertension, Dyslipidemia and Sleep Apnea.  Sonographer:    Mikki Harbor Referring Phys: [phone] DAVID TAT IMPRESSIONS  1. Left ventricular ejection fraction, by estimation, is 60 to 65%. The left ventricle has normal function. The left ventricle has no regional wall motion abnormalities. There is mild left ventricular hypertrophy. Left ventricular diastolic parameters are indeterminate.  2. Right ventricular systolic function is normal. The right ventricular  size is normal. There is mildly elevated pulmonary artery systolic pressure.  3. Left atrial size was moderately dilated.  4. Right atrial size was mildly dilated.  5. The mitral valve is abnormal. Mild to moderate mitral valve regurgitation. No evidence of mitral stenosis.  6. The tricuspid valve is abnormal. Tricuspid valve regurgitation is moderate.  7. The aortic valve is bicuspid. There is mild calcification of the aortic valve. There is mild thickening of the aortic valve. Aortic valve regurgitation is not visualized. No aortic stenosis is present.  8. The inferior vena cava is normal in size with greater than 50% respiratory variability, suggesting right atrial pressure of 3 mmHg. FINDINGS  Left Ventricle: Left ventricular ejection fraction, by estimation, is 60 to 65%. The left ventricle has normal function. The left ventricle has no regional wall  motion abnormalities. The left ventricular internal cavity size was normal in size. There is  mild left ventricular hypertrophy. Left ventricular diastolic parameters are indeterminate. Right Ventricle: The right ventricular size is normal. Right vetricular wall thickness was not well visualized. Right ventricular systolic function is normal. There is mildly elevated pulmonary artery systolic pressure. The tricuspid regurgitant velocity  is 3.21 m/s, and with an assumed right atrial pressure of 3 mmHg, the estimated right ventricular systolic pressure is 44.2 mmHg. Left Atrium: Left atrial size was moderately dilated. Right Atrium: Right atrial size was mildly dilated. Pericardium: There is no evidence of pericardial effusion. Mitral Valve: The mitral valve is abnormal. There is mild thickening of the mitral valve leaflet(s). There is mild calcification of the mitral valve leaflet(s). Mild to moderate mitral valve regurgitation. No evidence of mitral valve stenosis. MV peak gradient, 11.4 mmHg. The mean mitral valve gradient is 3.5 mmHg. Tricuspid Valve: The  tricuspid valve is abnormal. Tricuspid valve regurgitation is moderate . No evidence of tricuspid stenosis. Aortic Valve: The aortic valve is bicuspid. There is mild calcification of the aortic valve. There is mild thickening of the aortic valve. There is mild aortic valve annular calcification. Aortic valve regurgitation is not visualized. No aortic stenosis is present. Aortic valve mean gradient measures 7.0 mmHg. Aortic valve peak gradient measures 15.4 mmHg. Aortic valve area, by VTI measures 1.98 cm. Pulmonic Valve: The pulmonic valve was not well visualized. Pulmonic valve regurgitation is not visualized. No evidence of pulmonic stenosis. Aorta: The aortic root is normal in size and structure. Venous: The inferior vena cava is normal in size with greater than 50% respiratory variability, suggesting right atrial pressure of 3 mmHg. IAS/Shunts: No atrial level shunt detected by color flow Doppler.  LEFT VENTRICLE PLAX 2D LVIDd:         5.30 cm LVIDs:         3.30 cm LV PW:         1.10 cm LV IVS:        1.10 cm LVOT diam:     1.90 cm LV SV:         78 LV SV Index:   40 LVOT Area:     2.84 cm  RIGHT VENTRICLE RV Basal diam:  3.30 cm RV Mid diam:    2.60 cm RV S prime:     12.00 cm/s LEFT ATRIUM             Index        RIGHT ATRIUM           Index LA diam:        4.80 cm 2.46 cm/m   RA Area:     20.20 cm LA Vol (A2C):   97.0 ml 49.69 ml/m  RA Volume:   53.80 ml  27.56 ml/m LA Vol (A4C):   72.0 ml 36.88 ml/m LA Biplane Vol: 84.7 ml 43.39 ml/m  AORTIC VALVE                     PULMONIC VALVE AV Area (Vmax):    1.79 cm      PV Vmax:       1.01 m/s AV Area (Vmean):   1.96 cm      PV Peak grad:  4.1 mmHg AV Area (VTI):     1.98 cm AV Vmax:           196.50 cm/s AV Vmean:  120.500 cm/s AV VTI:            0.391 m AV Peak Grad:      15.4 mmHg AV Mean Grad:      7.0 mmHg LVOT Vmax:         124.00 cm/s LVOT Vmean:        83.150 cm/s LVOT VTI:          0.274 m LVOT/AV VTI ratio: 0.70  AORTA Ao Root diam:  3.30 cm MITRAL VALVE                TRICUSPID VALVE MV Area (PHT): 4.99 cm     TR Peak grad:   41.2 mmHg MV Area VTI:   2.32 cm     TR Vmax:        321.00 cm/s MV Peak grad:  11.4 mmHg MV Mean grad:  3.5 mmHg     SHUNTS MV Vmax:       1.68 m/s     Systemic VTI:  0.27 m MV Vmean:      82.5 cm/s    Systemic Diam: 1.90 cm MV Decel Time: 152 msec MV E velocity: 127.00 cm/s Dina Rich MD Electronically signed by Dina Rich MD Signature Date/Time: 06/21/2023/10:28:34 AM    Final     Assessment: 82 y.o. female with complex past medical history including paraesophageal hernia repair, Heller myotomy, gastropexy at [facility] in 2023, unable to exclude achalasia and required G tube in past but removed Jan 2024, admitted with acute respiratory failure secondary to aspiration pneumonia and GI consulted due to worsening dysphagia.   Dysphagia:  Slow progressive worsening dysphagia over the last few months with severe symptoms over the last 1 month with regurgitation of essentially anything that she eats or drinks.  Patient reports food getting stuck in her esophagus that would occasionally pass with drinking liquids, but otherwise would come back up.  Also with intermittent liquid dysphagia.  Unable to discern today if she has sensation of penetration/aspiration.  She has been very fearful this admission during her speech therapy evaluations.  Patient stating today that she is better with this, but was scared of getting choked.  Last modified barium swallow completed in October 2023 with min/mod pharyngeal phase dysphagia with minimal decreased tongue base retraction and epiglottic deflection, reduced relaxation of the EUS resulting in occasional flash penetration of thins, minimal vallecular residuals with pure/solids, and moderate pyriform residue post swallow.  Esophageal sweep with barium filled esophagus in the distal two thirds of the esophagus with some retrograde movement.  Single contrast barium  swallow completed yesterday with limited exam due to immobility, trouble with consecutive swallows, and thoracic spine hardware, but did show at least moderate stricture at the GE junction.  I suspect her symptoms are likely multifactorial with oropharyngeal as well as esophageal phase dysphagia. Esophageal phase likely the primary driver of her recurrent regurgitation and very well could be leading to recurrent pneumonia. She had no frank aspiration on prior MBSS, but unable to rule this out now. Daughter reporting this was reported that episode of pneumonia.  Due to poor respiratory status with patient still requiring fairly significant oxygen supplementation, she is too high risk for an EGD.  If respiratory status improves with minimal oxygenation requirements, could reconsider EGD here at [facility], but would need to clear with anesthesia first.  Additionally, patient did not give a clear answer today regarding her desire for repeat EGD. She and her daughter will discuss  this further.   Discussed patient going back to [facility] for evaluation outpatient, but patient/daughter state that they wish not to return to [facility] as they do not have a good experience there.  Additionally discussed the possibility of replacing a feeding tube if needed.  Patient's daughter states patient is very hesitant about this due to persistent diarrhea with last feeding tube.  It is unclear if any medications were tried to help with the diarrhea.  She does have chronic history of alternating constipation and diarrhea, but more diarrhea predominant.  She has history of cholecystectomy which is likely influencing this.  If tube feeds are needed and she has diarrhea, could consider cholestyramine.  Notably, due to her complex situation and overall significant decline over the last month,, palliative care is also on board and efforts to establish the goals of care.  There has been some discussion regarding transitioning  to comfort.   Elevated LFTs:  Dating back at least to March 2023 and seems to be associated with acute illnesses with AST and ALT normalizing with time. Viral hepatitis panel negative. [place] on file this admission. Overall improving this admission. Recommend follow-up/further evaluation as outpatient with primary GI.    Plan: Appreciate speech therapy assistance.  Appreciate palliative care assistance.  If not transitioned to comfort, could consider EGD this admission if respiratory status improved significantly, but would need to readdress with anesthesia. Otherwise, will need to be completed at tertiary care facility.  Continue IV PPI twice daily. Will repeat HFP today.    LOS: 4 days    06/22/2023, 1:11 PM   Ermalinda Memos, [facility]

## 2023-06-22 NOTE — Progress Notes (Signed)
BPE was reviewed yesterday. Suspicion for moderate stricture at GE junction. At this point, patient is high risk for anesthesia here at [facility]. Recommend continuing to work with Speech and pursue outpatient elective EGD with primary GI at [facility].

## 2023-06-22 NOTE — Progress Notes (Signed)
PROGRESS NOTE  Delila Doster Czerwinski ZOX:[id number] DOB: 22-Mar-1941 DOA: 06/18/2023 PCP: Sharee Holster, NP  Brief History:  82 year old female with a history of chronic back pain status post fusion, chronic lower extremity edema, coronary artery disease, HFmrEF (50-55%), permanent atrial fibrillation, hypertension, hyperlipidemia, restless leg syndrome, esophageal stricture, paraesophageal hernia status postsurgical repair, depression/anxiety presenting with increasing difficulty swallowing over the past month, worsening over the past 2 days.  He was also been complaining of lower abdominal pain.  Notably, the patient had a paraesophageal hernia repair, Heller myotomy, and gastropexy at [facility] on 02/17/22.  The patient had a complex postoperative history.  She began experiencing dysphagia again at the end of March 2023.  She was readmitted back to [facility] from 03/17/2022 to 04/13/2022.  She had a prolonged hospitalization which was complicated by development of atrial fibrillation with RVR and aspiration pneumonia.  She required intubation because of respiratory failure from her aspiration.  Subsequently, she underwent EGD on 03/27/2022 which showed severe postoperative esophageal narrowing at the GE junction dilated with a 10 to 12 mm CRE dilatation.  There is food noted in the stomach with obstruction views of the cardia.  After the EGD, the patient experienced pharyngoesophageal deficits impacting her swallowing.  After goals of care discussion with the patient's family they elected to proceed with a gastrostomy tube placement which was performed on 04/07/2022.  She was started on enteral feedings which she tolerated and ultimately discharged to a skilled nursing facility on 04/13/2022.   Thereafter, pt was ultimate evaluated by ENT and underwent FEES on 06/16/22 which suggested pt could begin liquid diet.  She continued to follow up with speech therapy with serial MBS studies and her diet was gradually  advanced.  Gastrostomy tube was removed in December 2023, and the patient has been eating by mouth since that period of time.  However over the next several months, she has noted increasing dysphagia type symptoms.  This has been worsening despite crushing her pills and eating softer food.  She has had to basically tolerate what amounts to a full liquid and soft diet.  However in the past few days prior to admission she has had increasing regurgitation, hypersalivation, and dry heaving.  She has had difficulty tolerating her pills.  She has had increasing shortness of breath over the past few days.  She denies any frank chest pain, fevers, chills, diarrhea, hematochezia, melena.  She has had some urinary frequency but denies any frank dysuria.  There is no hematemesis. Because she has had increasing difficulty tolerating pills she has not been able to take her cardiac medications or pain medications. In the ED, the patient was afebrile, but tachycardic in the 140s.  She was hemodynamically stable.  Oxygen saturation was 87% on 2 L.  She was placed on nasal cannula.  WBC 6.5, hemoglobin 9.6, platelets 171.  Sodium 131, potassium 3.7, bicarbonate 24, serum creatinine 0.50.  AST 216, ALT 210, alk phos 50-50, total bili 0.8.  EKG showed atrial fibrillation with RVR.  There is no ST-T wave changes.  CT chest and abdomen showed multiple airspace opacities bilaterally consistent with multifocal pneumonia.  There is moderate renal atrophy.  There is pancreatic atrophy.  There is a periumbilical hernia.  Stomach was within normal limits.  There is no bowel obstruction.  She was started on diltiazem drip, ceftriaxone, azithromycin.   Assessment/Plan: Acute respiratory failure with hypoxia -Secondary to aspiration pneumonia -Stable on  4 L nasal cannula -Presented with tachypnea and hypoxia -personally reviewed CXR--increased interstitial markings   Aspiration pneumonia -continue zosyn -Speech therapy evaluation  appreciated>>may have liquids   Dysphagia -complex hx with paraesophageal hernia repair 02/17/22--see above discussion -GI consult appreciated>>plan for barium esophagram>>likely esophageal stricture -Clear liquids for now if able to tolerate -appreciate speech therapy eval>>may if liquids -discussed with GI, Dr. Suzan Slick feels pt is too high risk due to her oxygen demand   Permanent atrial fibrillation with RVR -Echocardiogram--delayed due to continue tachycardia -Continue diltiazem drip>>now rate controlled -TSH--0.937 -Continue IV heparin until until patient is able to tolerate pills   Chronic pain syndrome/chronic back pain/opioid dependence -She is on a fentanyl patch, 25 mcg chronically>>increase to 50 mcg -She is on tramadol for breakthrough pain at SNF -prn IV fentanyl until able to swallow -Continue gabapentin if able to swallow -increased fentanyl to 50 mcg TD patch   Chronic HFimpEF -Appears clinically euvolemic -10/27/2021 echo EF 50-55%, no WMA, trivial MR -Holding furosemide temporarily -7/1 Echo EF 60-65%, no WMA, normal RVF, mod TR; biscupid aortic valve; mild-mod MR   Restless leg syndrome -Restart ropinirole when patient able to tolerate p.o.   GERD -Pantoprazole twice daily IV   Depression/anxiety -Restart fluoxetine 60 mg daily when able to tolerate p.o. -Restart BuSpar once able to tolerate p.o. -pt refuses po intake.  After discussion with daughter>>agrees to try valium IV   Hyponatremia -Secondary to poor solute intake and volume depletion -Judicious IV normal saline   Hypokalemia -replete -mag 2.0  Goals of Care -long discussion with patient and daughter 06/22/23 -plan to transition to full comfort measures in next 24 hours after family has had a chance to visit -no further blood draw or diagnostic studies               Family Communication:   daughter at bedside 7/2   Consultants:  GI   Code Status:  DNR   DVT  Prophylaxis:  IV Heparin      Procedures: As Listed in Progress Note Above   Antibiotics: Zosyn 6/28>>     Subjective: Pt complains of back pain.  Denies f/c, cp, sob, n/v/d  Objective: Vitals:   06/22/23 0900 06/22/23 1000 06/22/23 1100 06/22/23 1500  BP: (!) 136/50 125/68 (!) 150/66   Pulse: 71 76 98   Resp: 13 (!) 22 12   Temp:   98.9 F (37.2 C) 99.6 F (37.6 C)  TempSrc:   Oral Axillary  SpO2: 96% 97% 95%   Weight:        Intake/Output Summary (Last 24 hours) at 06/22/2023 1758 Last data filed at 06/22/2023 1500 Gross per 24 hour  Intake 407.92 ml  Output 2900 ml  Net -2492.08 ml   Weight change:  Exam:  General:  Pt is alert, follows commands appropriately, not in acute distress HEENT: No icterus, No thrush, No neck mass, [place]/AT Cardiovascular: RRR, S1/S2, no rubs, no gallops Respiratory: bibasilar rales.  No wheeze Abdomen: Soft/+BS, non tender, non distended, no guarding Extremities: No edema, No lymphangitis, No petechiae, No rashes, no synovitis   Data Reviewed: I have personally reviewed following labs and imaging studies Basic Metabolic Panel: Recent Labs  Lab 06/18/23 1144 06/19/23 0442 06/20/23 0533 06/21/23 0353 06/22/23 0543  NA 131* 134* 135 136 136  K 3.7 3.6 3.5 3.4* 3.2*  CL 97* 102 104 102 98  CO2 24 23 24  21* 24  GLUCOSE 130* 89 80 73 92  BUN 10 11 11  9 7*  CREATININE 0.50 0.59 0.53 0.56 0.64  CALCIUM 8.3* 8.2* 8.1* 8.3* 8.5*  MG  --   --   --  2.0 1.8   Liver Function Tests: Recent Labs  Lab 06/18/23 1144 06/19/23 0442 06/20/23 0533 06/21/23 0353 06/22/23 0543  AST 216* 109* 50* 32 28  ALT 210* 142* 92* 69* 59*  ALKPHOS 250* 194* 158* 153* 163*  BILITOT 0.8 0.6 0.8 0.9 1.1  PROT 6.9 6.5 6.0* 5.8* 6.6  ALBUMIN 3.4* 3.0* 2.8* 2.7* 3.1*   No results for input(s): "LIPASE", "AMYLASE" in the last 168 hours. No results for input(s): "AMMONIA" in the last 168 hours. Coagulation Profile: No results for input(s): "INR",  "PROTIME" in the last 168 hours. CBC: Recent Labs  Lab 06/18/23 1144 06/19/23 0442 06/20/23 0533 06/21/23 0353 06/22/23 0543  WBC 6.5 6.1 5.0 4.4 4.6  NEUTROABS 5.0  --   --   --   --   HGB 11.6* 11.1* 10.8* 10.7* 12.2  HCT 36.3 35.3* 35.6* 34.7* 39.1  MCV 87.5 89.1 91.3 92.3 89.3  PLT 171 166 148* 144* 156   Cardiac Enzymes: No results for input(s): "CKTOTAL", "CKMB", "CKMBINDEX", "TROPONINI" in the last 168 hours. BNP: Invalid input(s): "POCBNP" CBG: No results for input(s): "GLUCAP" in the last 168 hours. HbA1C: No results for input(s): "HGBA1C" in the last 72 hours. Urine analysis:    Component Value Date/Time   COLORURINE YELLOW 06/19/2023 1845   APPEARANCEUR HAZY (A) 06/19/2023 1845   APPEARANCEUR Clear 06/21/2018 1248   LABSPEC 1.018 06/19/2023 1845   PHURINE 5.0 06/19/2023 1845   GLUCOSEU NEGATIVE 06/19/2023 1845   HGBUR SMALL (A) 06/19/2023 1845   BILIRUBINUR NEGATIVE 06/19/2023 1845   BILIRUBINUR Negative 06/21/2018 1248   KETONESUR 20 (A) 06/19/2023 1845   PROTEINUR 30 (A) 06/19/2023 1845   UROBILINOGEN 0.2 10/27/2014 0729   NITRITE NEGATIVE 06/19/2023 1845   LEUKOCYTESUR NEGATIVE 06/19/2023 1845   Sepsis Labs: @LABRCNTIP (procalcitonin:4,lacticidven:4) ) Recent Results (from the past 240 hour(s))  Blood culture (routine x 2)     Status: None (Preliminary result)   Collection Time: 06/18/23  2:39 PM   Specimen: BLOOD  Result Value Ref Range Status   Specimen Description BLOOD BLOOD LEFT ARM  Final   Special Requests   Final    BOTTLES DRAWN AEROBIC AND ANAEROBIC Blood Culture adequate volume   Culture   Final    NO GROWTH 4 DAYS Performed at [facility], [address] [place], [place] [address]    Report Status PENDING  Incomplete  Blood culture (routine x 2)     Status: None (Preliminary result)   Collection Time: 06/18/23  2:44 PM   Specimen: BLOOD  Result Value Ref Range Status   Specimen Description BLOOD BLOOD LEFT HAND  Final   Special  Requests   Final    BOTTLES DRAWN AEROBIC AND ANAEROBIC Blood Culture adequate volume   Culture   Final    NO GROWTH 4 DAYS Performed at [facility], [address] [place], [place] [address]    Report Status PENDING  Incomplete  Resp panel by RT-PCR (RSV, Flu A&B, Covid) Anterior Nasal Swab     Status: None   Collection Time: 06/19/23  1:08 PM   Specimen: Anterior Nasal Swab  Result Value Ref Range Status   SARS Coronavirus 2 by RT PCR NEGATIVE NEGATIVE Final    Comment: (NOTE) SARS-CoV-2 target nucleic acids are NOT DETECTED.  The SARS-CoV-2 RNA is generally detectable in  upper respiratory specimens during the acute phase of infection. The lowest concentration of SARS-CoV-2 viral copies this assay can detect is 138 copies/mL. A negative result does not preclude SARS-Cov-2 infection and should not be used as the sole basis for treatment or other patient management decisions. A negative result may occur with  improper specimen collection/handling, submission of specimen other than nasopharyngeal swab, presence of viral mutation(s) within the areas targeted by this assay, and inadequate number of viral copies(<138 copies/mL). A negative result must be combined with clinical observations, patient history, and epidemiological information. The expected result is Negative.  Fact Sheet for Patients:  BloggerCourse.com  Fact Sheet for Healthcare Providers:  SeriousBroker.it  This test is no t yet approved or cleared by the [place] FDA and  has been authorized for detection and/or diagnosis of SARS-CoV-2 by FDA under an Emergency Use Authorization (EUA). This EUA will remain  in effect (meaning this test can be used) for the duration of the COVID-19 declaration under Section 564(b)(1) of the Act, 21 U.S.C.section 360bbb-3(b)(1), unless the authorization is terminated  or revoked sooner.       Influenza A by PCR NEGATIVE  NEGATIVE Final   Influenza B by PCR NEGATIVE NEGATIVE Final    Comment: (NOTE) The Xpert Xpress SARS-CoV-2/FLU/RSV plus assay is intended as an aid in the diagnosis of influenza from Nasopharyngeal swab specimens and should not be used as a sole basis for treatment. Nasal washings and aspirates are unacceptable for Xpert Xpress SARS-CoV-2/FLU/RSV testing.  Fact Sheet for Patients: BloggerCourse.com  Fact Sheet for Healthcare Providers: SeriousBroker.it  This test is not yet approved or cleared by the [place] FDA and has been authorized for detection and/or diagnosis of SARS-CoV-2 by FDA under an Emergency Use Authorization (EUA). This EUA will remain in effect (meaning this test can be used) for the duration of the COVID-19 declaration under Section 564(b)(1) of the Act, 21 U.S.C. section 360bbb-3(b)(1), unless the authorization is terminated or revoked.     Resp Syncytial Virus by PCR NEGATIVE NEGATIVE Final    Comment: (NOTE) Fact Sheet for Patients: BloggerCourse.com  Fact Sheet for Healthcare Providers: SeriousBroker.it  This test is not yet approved or cleared by the [place] FDA and has been authorized for detection and/or diagnosis of SARS-CoV-2 by FDA under an Emergency Use Authorization (EUA). This EUA will remain in effect (meaning this test can be used) for the duration of the COVID-19 declaration under Section 564(b)(1) of the Act, 21 U.S.C. section 360bbb-3(b)(1), unless the authorization is terminated or revoked.  Performed at [facility], [address] [place], [place] [address]   MRSA Next Gen by PCR, Nasal     Status: None   Collection Time: 06/19/23  1:08 PM   Specimen: Nasal Mucosa; Nasal Swab  Result Value Ref Range Status   MRSA by PCR Next Gen NOT DETECTED NOT DETECTED Final    Comment: (NOTE) The GeneXpert MRSA Assay (FDA approved for  NASAL specimens only), is one component of a comprehensive MRSA colonization surveillance program. It is not intended to diagnose MRSA infection nor to guide or monitor treatment for MRSA infections. Test performance is not FDA approved in patients less than 63 years old. Performed at [facility], [address], [place], [place] [address]   Respiratory (~20 pathogens) panel by PCR     Status: Abnormal   Collection Time: 06/19/23  1:08 PM   Specimen: Nasopharyngeal Swab; Respiratory  Result Value Ref Range Status   Adenovirus NOT  DETECTED NOT DETECTED Final   Coronavirus 229E NOT DETECTED NOT DETECTED Final    Comment: (NOTE) The Coronavirus on the Respiratory Panel, DOES NOT test for the novel  Coronavirus (2019 nCoV)    Coronavirus HKU1 NOT DETECTED NOT DETECTED Final   Coronavirus NL63 NOT DETECTED NOT DETECTED Final   Coronavirus OC43 NOT DETECTED NOT DETECTED Final   Metapneumovirus NOT DETECTED NOT DETECTED Final   Rhinovirus / Enterovirus DETECTED (A) NOT DETECTED Final   Influenza A NOT DETECTED NOT DETECTED Final   Influenza B NOT DETECTED NOT DETECTED Final   Parainfluenza Virus 1 NOT DETECTED NOT DETECTED Final   Parainfluenza Virus 2 NOT DETECTED NOT DETECTED Final   Parainfluenza Virus 3 NOT DETECTED NOT DETECTED Final   Parainfluenza Virus 4 NOT DETECTED NOT DETECTED Final   Respiratory Syncytial Virus NOT DETECTED NOT DETECTED Final   Bordetella pertussis NOT DETECTED NOT DETECTED Final   Bordetella Parapertussis NOT DETECTED NOT DETECTED Final   Chlamydophila pneumoniae NOT DETECTED NOT DETECTED Final   Mycoplasma pneumoniae NOT DETECTED NOT DETECTED Final    Comment: Performed at [facility], [address], [place], [place] [address]     Scheduled Meds:  busPIRone  10 mg Oral TID   Chlorhexidine Gluconate Cloth  6 each Topical Daily   fentaNYL  1 patch Transdermal Q72H   lidocaine  1 patch Transdermal Q24H   pantoprazole (PROTONIX) IV  40 mg  Intravenous Q12H   Continuous Infusions:  diltiazem (CARDIZEM) infusion 5 mg/hr (06/22/23 0305)   heparin 1,000 Units/hr (06/21/23 1018)   HYDROmorphone     piperacillin-tazobactam (ZOSYN)  IV 3.375 g (06/22/23 1435)    Procedures/Studies: DG CHEST PORT 1 VIEW  Result Date: 06/21/2023 CLINICAL DATA:  Dyspnea EXAM: PORTABLE CHEST 1 VIEW COMPARISON:  06/18/2023, CT 06/18/2023 FINDINGS: Central airways thickening. Increasing airspace disease at left base. Mild cardiomegaly. Aortic atherosclerosis. Extensive posterior spinal rods and fixating screws IMPRESSION: Central airways thickening with increasing left base airspace disease, possible pneumonia or aspiration. Probable small left effusion Electronically Signed   By: Jasmine Pang M.D.   On: 06/21/2023 17:00   DG ESOPHAGUS W SINGLE CM (SOL OR THIN BA)  Result Date: 06/21/2023 CLINICAL DATA:  Dysphagia. History of esophageal stricture. Gastroesophageal reflux disease. EXAM: ESOPHOGRAM/BARIUM SWALLOW TECHNIQUE: Single contrast examination was performed using  thin barium. FLUOROSCOPY: Radiation Exposure Index (as provided by the fluoroscopic device): 63.0 mGy Kerma COMPARISON:  06/18/2023 CT , FINDINGS: Severely limited exam. The patient is immobile and struggles with multiple consecutive swallows. Further thoracic spine fixation hardware projects over the esophagus when the patient is supine and minimally oblique. Patient not able to tolerate lateral positioning. Esophageal motility could not be evaluated. No gross upper esophageal narrowing to confirm stricture. No large hiatal hernia. An area of at least moderate narrowing is suspected at the gastroesophageal junction, including on 283/4. The patient does not take tablets so 13 mm barium tablet was not given. IMPRESSION: 1. Severely limited exam, including patient immobility, thoracic spine hardware projecting over the esophagus, and inability to perform consecutive swallows. 2. Suspicion of at least a  moderate stricture at the gastroesophageal junction. 3. Consider repeat exam, when patient is clinically stable (i.e. As an outpatient) and can be evaluated from a lateral projection. Electronically Signed   By: Jeronimo Greaves M.D.   On: 06/21/2023 15:05   ECHOCARDIOGRAM COMPLETE  Result Date: 06/21/2023    ECHOCARDIOGRAM REPORT   Patient Name:   BIANNCA SHIRAISHI Fulghum Date  of Exam: 06/21/2023 Medical Rec #:  [id number]       Height:       67.0 in Accession #:    [id number]      Weight:       184.1 lb Date of Birth:  1941-06-21        BSA:          1.952 m Patient Age:    81 years        BP:           155/54 mmHg Patient Gender: F               HR:           102 bpm. [facility] Procedure: 2D Echo, Cardiac Doppler and Color Doppler Indications:    Atrial Fibrillation  History:        Patient has prior history of Echocardiogram examinations, most                 recent 10/27/2021. CHF, CAD, Arrythmias:Atrial Fibrillation; Risk                 Factors:Hypertension, Dyslipidemia and Sleep Apnea.  Sonographer:    Mikki Harbor Referring Phys: [phone] W[place]ell Craddock IMPRESSIONS  1. Left ventricular ejection fraction, by estimation, is 60 to 65%. The left ventricle has normal function. The left ventricle has no regional wall motion abnormalities. There is mild left ventricular hypertrophy. Left ventricular diastolic parameters are indeterminate.  2. Right ventricular systolic function is normal. The right ventricular size is normal. There is mildly elevated pulmonary artery systolic pressure.  3. Left atrial size was moderately dilated.  4. Right atrial size was mildly dilated.  5. The mitral valve is abnormal. Mild to moderate mitral valve regurgitation. No evidence of mitral stenosis.  6. The tricuspid valve is abnormal. Tricuspid valve regurgitation is moderate.  7. The aortic valve is bicuspid. There is mild calcification of the aortic valve. There is mild thickening of the aortic valve. Aortic valve regurgitation is  not visualized. No aortic stenosis is present.  8. The inferior vena cava is normal in size with greater than 50% respiratory variability, suggesting right atrial pressure of 3 mmHg. FINDINGS  Left Ventricle: Left ventricular ejection fraction, by estimation, is 60 to 65%. The left ventricle has normal function. The left ventricle has no regional wall motion abnormalities. The left ventricular internal cavity size was normal in size. There is  mild left ventricular hypertrophy. Left ventricular diastolic parameters are indeterminate. Right Ventricle: The right ventricular size is normal. Right vetricular wall thickness was not well visualized. Right ventricular systolic function is normal. There is mildly elevated pulmonary artery systolic pressure. The tricuspid regurgitant velocity  is 3.21 m/s, and with an assumed right atrial pressure of 3 mmHg, the estimated right ventricular systolic pressure is 44.2 mmHg. Left Atrium: Left atrial size was moderately dilated. Right Atrium: Right atrial size was mildly dilated. Pericardium: There is no evidence of pericardial effusion. Mitral Valve: The mitral valve is abnormal. There is mild thickening of the mitral valve leaflet(s). There is mild calcification of the mitral valve leaflet(s). Mild to moderate mitral valve regurgitation. No evidence of mitral valve stenosis. MV peak gradient, 11.4 mmHg. The mean mitral valve gradient is 3.5 mmHg. Tricuspid Valve: The tricuspid valve is abnormal. Tricuspid valve regurgitation is moderate . No evidence of tricuspid stenosis. Aortic Valve: The aortic valve is bicuspid. There is mild calcification of the aortic valve. There is  mild thickening of the aortic valve. There is mild aortic valve annular calcification. Aortic valve regurgitation is not visualized. No aortic stenosis is present. Aortic valve mean gradient measures 7.0 mmHg. Aortic valve peak gradient measures 15.4 mmHg. Aortic valve area, by VTI measures 1.98 cm. Pulmonic  Valve: The pulmonic valve was not well visualized. Pulmonic valve regurgitation is not visualized. No evidence of pulmonic stenosis. Aorta: The aortic root is normal in size and structure. Venous: The inferior vena cava is normal in size with greater than 50% respiratory variability, suggesting right atrial pressure of 3 mmHg. IAS/Shunts: No atrial level shunt detected by color flow Doppler.  LEFT VENTRICLE PLAX 2D LVIDd:         5.30 cm LVIDs:         3.30 cm LV PW:         1.10 cm LV IVS:        1.10 cm LVOT diam:     1.90 cm LV SV:         78 LV SV Index:   40 LVOT Area:     2.84 cm  RIGHT VENTRICLE RV Basal diam:  3.30 cm RV Mid diam:    2.60 cm RV S prime:     12.00 cm/s LEFT ATRIUM             Index        RIGHT ATRIUM           Index LA diam:        4.80 cm 2.46 cm/m   RA Area:     20.20 cm LA Vol (A2C):   97.0 ml 49.69 ml/m  RA Volume:   53.80 ml  27.56 ml/m LA Vol (A4C):   72.0 ml 36.88 ml/m LA Biplane Vol: 84.7 ml 43.39 ml/m  AORTIC VALVE                     PULMONIC VALVE AV Area (Vmax):    1.79 cm      PV Vmax:       1.01 m/s AV Area (Vmean):   1.96 cm      PV Peak grad:  4.1 mmHg AV Area (VTI):     1.98 cm AV Vmax:           196.50 cm/s AV Vmean:          120.500 cm/s AV VTI:            0.391 m AV Peak Grad:      15.4 mmHg AV Mean Grad:      7.0 mmHg LVOT Vmax:         124.00 cm/s LVOT Vmean:        83.150 cm/s LVOT VTI:          0.274 m LVOT/AV VTI ratio: 0.70  AORTA Ao Root diam: 3.30 cm MITRAL VALVE                TRICUSPID VALVE MV Area (PHT): 4.99 cm     TR Peak grad:   41.2 mmHg MV Area VTI:   2.32 cm     TR Vmax:        321.00 cm/s MV Peak grad:  11.4 mmHg MV Mean grad:  3.5 mmHg     SHUNTS MV Vmax:       1.68 m/s     Systemic VTI:  0.27 m MV Vmean:      82.5 cm/s  Systemic Diam: 1.90 cm MV Decel Time: 152 msec MV E velocity: 127.00 cm/s Dina Rich MD Electronically signed by Dina Rich MD Signature Date/Time: 06/21/2023/10:28:34 AM    Final    US Abdomen Limited RUQ  (LIVER/GB)  Result Date: 06/18/2023 CLINICAL DATA:  Elevated transaminase. EXAM: ULTRASOUND ABDOMEN LIMITED RIGHT UPPER QUADRANT COMPARISON:  Ultrasound June 2018.  CT scan earlier 06/18/2023 FINDINGS: Gallbladder: Previous surgical removal. Common bile duct: Diameter: 8 mm. Within normal limits for the patient's age in the post cholecystectomy state. This measured 10 mm in 2018. Liver: Homogeneous hepatic parenchyma. Mild intrahepatic biliary duct ectasia. Portal vein is patent on color Doppler imaging with normal direction of blood flow towards the liver. Other: Study limited by overlapping bowel gas and soft tissue. IMPRESSION: Previous cholecystectomy. Stable prominent common duct and some mild intrahepatic biliary duct ectasia. Study limited by overlapping bowel gas and soft tissue. Electronically Signed   By: Karen Kays M.D.   On: 06/18/2023 17:25   CT CHEST ABDOMEN PELVIS WO CONTRAST  Result Date: 06/18/2023 CLINICAL DATA:  Dysphagia, acute generalized abdominal pain. EXAM: CT CHEST, ABDOMEN AND PELVIS WITHOUT CONTRAST TECHNIQUE: Multidetector CT imaging of the chest, abdomen and pelvis was performed following the standard protocol without IV contrast. RADIATION DOSE REDUCTION: This exam was performed according to the departmental dose-optimization program which includes automated exposure control, adjustment of the mA and/or kV according to patient size and/or use of iterative reconstruction technique. COMPARISON:  March 17, 2022. FINDINGS: CT CHEST FINDINGS Cardiovascular: Atherosclerosis of thoracic aorta is noted without aneurysm formation. Mild cardiomegaly. No pericardial effusion. Coronary artery calcifications are noted. Mediastinum/Nodes: No enlarged mediastinal, hilar, or axillary lymph nodes. Thyroid gland, trachea, and esophagus demonstrate no significant findings. Lungs/Pleura: No pneumothorax or pleural effusion is noted. Multiple patchy airspace opacities are noted bilaterally  consistent with multifocal pneumonia. Mild left posterior basilar subsegmental atelectasis is noted. Musculoskeletal: Surgical posterior fusion is seen involving the thoracic and lumbar spine. No acute osseous abnormality is noted. CT ABDOMEN PELVIS FINDINGS Hepatobiliary: No focal liver abnormality is seen. Status post cholecystectomy. No biliary dilatation. Pancreas: Pancreatic atrophy is again noted. No acute abnormality seen. Spleen: Normal in size without focal abnormality. Adrenals/Urinary Tract: Adrenal glands appear normal. Mild right renal atrophy is noted. No renal or ureteral calculi are noted. No definite hydronephrosis or renal obstruction is noted. Urinary bladder is unremarkable. Stomach/Bowel: Stomach is within normal limits. Appendix appears normal. No evidence of bowel wall thickening, distention, or inflammatory changes. Vascular/Lymphatic: Aortic atherosclerosis. No enlarged abdominal or pelvic lymph nodes. Reproductive: Uterus and bilateral adnexa are unremarkable. Other: Small fat containing periumbilical hernia. No ascites is noted. Musculoskeletal: Status post surgical posterior fusion of lumbar spine as well. No acute osseous abnormality is noted. IMPRESSION: Multiple airspace opacities are noted bilaterally consistent with multifocal pneumonia. Mild left posterior basilar subsegmental atelectasis is noted. Mild right renal atrophy is again noted. Pancreatic atrophy is again noted. Small fat containing periumbilical hernia. Status post surgical posterior fusion involving thoracic and lumbar spine. Aortic Atherosclerosis (ICD10-I70.0). Electronically Signed   By: Lupita Raider M.D.   On: 06/18/2023 12:57   DG Chest Port 1 View  Result Date: 06/18/2023 CLINICAL DATA:  82 year old female with shortness of breath, abdominal pain and difficulty swallowing for 1 week. EXAM: PORTABLE CHEST 1 VIEW COMPARISON:  Chest radiographs 02/23/2023 and earlier. FINDINGS: Portable AP upright view at 1220  hours. Stable lung volumes and mediastinal contours. Chronic eventration of the right hemidiaphragm is stable. Improved left lung  base ventilation since March. But there remains blunting of the left lateral costophrenic angle, and streaky retrocardiac opacity. No superimposed pneumothorax or pulmonary edema. No other confluent opacity. Extensive posterior spinal fusion hardware redemonstrated, grossly stable. Stable cholecystectomy clips. Paucity of bowel gas in the visible upper abdomen. IMPRESSION: Improved left lung base ventilation since March, but evidence of a small left pleural effusion and residual streaky, nonspecific retrocardiac opacity. Electronically Signed   By: Odessa Fleming M.D.   On: 06/18/2023 12:29    Catarina Hartshorn, DO  [facility]  If 7PM-7AM, please contact night-coverage [email or web address] Password [email or web address] 06/22/2023, 5:58 PM   LOS: 4 days

## 2023-06-22 NOTE — Progress Notes (Signed)
ANTICOAGULATION CONSULT NOTE   Pharmacy Consult for heparin Indication: atrial fibrillation  Allergies  Allergen Reactions   Buprenorphine Hcl Itching   Cymbalta [Duloxetine Hcl] Other (See Comments)    Other reaction(s): Other (See Comments) Personality changes - crying  2014   Morphine And Codeine Itching   Oxycodone-Acetaminophen Other (See Comments)    Personality changes    Valium [Diazepam] Other (See Comments)    Personality changes - "in another world" ;  Hallucinations  2015   Statins Other (See Comments)    Other reaction(s): Other (See Comments) Muscle weakness Muscle weakness   Morphine Itching   Altace [Ramipril] Other (See Comments)    unknown   Floxin [Ofloxacin] Other (See Comments)    unknown   Hydromorphone Other (See Comments)    Other reaction(s): Confusion (intolerance) unknown   Lovaza [Omega-3-Acid Ethyl Esters] Other (See Comments)    Muscle weakness    Trilipix [Choline Fenofibrate] Other (See Comments)    Muscle weakness    Zetia [Ezetimibe] Other (See Comments)    Muscle weakness     Patient Measurements: Weight: 83.5 kg (184 lb 1.4 oz) Heparin Dosing Weight: 80 kg  Vital Signs: Temp: 98.9 F (37.2 C) (07/02 0749) Temp Source: Axillary (07/02 0749) BP: 154/64 (07/02 0800) Pulse Rate: 84 (07/02 0800)  Labs: Recent Labs    06/19/23 1717 06/20/23 0533 06/20/23 0533 06/21/23 0353 06/22/23 0543  HGB  --  10.8*   < > 10.7* 12.2  HCT  --  35.6*  --  34.7* 39.1  PLT  --  148*  --  144* 156  APTT 73* 73*  --  75*  --   HEPARINUNFRC  --  0.81*  --  0.57 0.34  CREATININE  --  0.53  --  0.56 0.64   < > = values in this interval not displayed.     Estimated Creatinine Clearance: 59.2 mL/min (by C-G formula based on SCr of 0.64 mg/dL).   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 difficile colitis 07/21/2022   CAD (coronary artery disease)    a. Abnl nuc 11/2014. Cath 12/2014 - turned down for CABG. Ultimately s/p TTVP, rotational atherectomy, PTCA and stenting of the ostial LCx and left main into the LAD (crush technique), and IVUS of the LAD/Left main. // b. Myoview 11/17: EF 48, poor quality/significant artifact; inf-lateral, inferior ischemia; Intermediate Risk   Chronic atrial fibrillation (HCC)    a.  First noted post-op 9/15 spinal fusion.  She had cardioversion, not on anticoagulation. Fall risk, unsteady. // failed DCCV // Holter 10/17: AFib, Avg HR 97, PVCs, no other arrhythmia   Chronic back pain greater than 3 months duration    a. spinal stenosis.  Spinal fusion with rods in 2/15 at [facility] spinal fusion 9/15.   Chronic diastolic CHF    Echo 2/17:  EF 50-55%, trivial AI, midl MR, mod LAE, PASP 37 mmHg // Echocardiogram 03/2020: EF 45-50, no RWMA, mild LVH, mild reduced RVSF, mildly elevated PASP (RVSP 38.3), mild LAE, mild MR, mild to mod TR, trivial AI // Echo 9/21: EF 45, mild LVH, mildly reduced RV SF, moderate LAE, mild RAE, mild MR, mild AI  Circadian rhythm sleep disorder    CTS (carpal tunnel syndrome)    Deficiency anemia 11/10/2014   Depression    Diarrhea    Diverticulitis of colon    Esophageal stricture    Gastritis    Gastroesophageal hernia 07/06/2013   GERD (gastroesophageal reflux disease)    Hiatal hernia    History of blood transfusion    "most of them related to OR's"    HTN (hypertension)    Hyperlipidemia    IBS (irritable bowel syndrome)    Incontinence 10/13/2012   Insomnia    Ischemic chest pain (HCC)    Memory disorder 12/04/2014   Metabolic syndrome 04/24/2014   MGUS (monoclonal gammopathy of unknown significance) dx'd 11/2014   a. Neg BMB 11/2014.   Multiple falls    Obesity     Obstructive sleep apnea    "have mask; don't wear it" (01/07/2015)   Orthostasis    PAT (paroxysmal atrial tachycardia)    Personal history of colonic polyps 10/25/2011 & 12/02/11   not retrieved Dr Victorino Dike & tubular adenomas   Pneumonia 03/2014   Sinus bradycardia    a. Baseline HR 50s-60s.   Stroke [facility]) early 2000's   "small"; denies residual on 01/07/2015)    Assessment: Pharmacy consulted to dose heparin in patient with atrial fibrillation.  Patient is on Eliquis prior to admission with last dose 6/27 @ 2200.  Will need to monitor aPTT until correlation with heparin levels.  Heparin level continues to be at goal on 1000 units/hr. No bleeding or IV issues noted.   Goal of Therapy:  HL 0.3-0.7 Monitor platelets by anticoagulation protocol: Yes   Plan:  Continue heparin at 1000 units/hr  Daily heparin level and CBC F/u transition back to DOAC when able to take po  Sheppard Coil PharmD., BCPS Clinical Pharmacist 06/22/2023 8:14 AM

## 2023-06-23 ENCOUNTER — Encounter (HOSPITAL_COMMUNITY): Admission: EM | Disposition: E | Payer: Self-pay | Source: Home / Self Care | Attending: Internal Medicine

## 2023-06-23 DIAGNOSIS — E43 Unspecified severe protein-calorie malnutrition: Secondary | ICD-10-CM

## 2023-06-23 DIAGNOSIS — I10 Essential (primary) hypertension: Secondary | ICD-10-CM

## 2023-06-23 DIAGNOSIS — I4821 Permanent atrial fibrillation: Secondary | ICD-10-CM | POA: Diagnosis not present

## 2023-06-23 DIAGNOSIS — Z515 Encounter for palliative care: Secondary | ICD-10-CM | POA: Diagnosis not present

## 2023-06-23 DIAGNOSIS — F339 Major depressive disorder, recurrent, unspecified: Secondary | ICD-10-CM

## 2023-06-23 DIAGNOSIS — I5032 Chronic diastolic (congestive) heart failure: Secondary | ICD-10-CM | POA: Diagnosis not present

## 2023-06-23 DIAGNOSIS — J690 Pneumonitis due to inhalation of food and vomit: Secondary | ICD-10-CM | POA: Diagnosis not present

## 2023-06-23 DIAGNOSIS — E871 Hypo-osmolality and hyponatremia: Secondary | ICD-10-CM | POA: Insufficient documentation

## 2023-06-23 LAB — CULTURE, BLOOD (ROUTINE X 2)
Culture: NO GROWTH
Special Requests: ADEQUATE

## 2023-06-23 SURGERY — CARDIOVERSION
Anesthesia: Monitor Anesthesia Care

## 2023-06-23 MED ORDER — BIOTENE DRY MOUTH MT LIQD: 15.0000 mL | OROMUCOSAL | Status: DC | PRN

## 2023-06-23 MED ORDER — GLYCOPYRROLATE 0.2 MG/ML IJ SOLN: 0.2000 mg | INTRAMUSCULAR | Status: DC | PRN

## 2023-06-23 MED ORDER — HALOPERIDOL LACTATE 5 MG/ML IJ SOLN: 0.5000 mg | INTRAMUSCULAR | Status: DC | PRN

## 2023-06-23 MED ORDER — GLYCOPYRROLATE 1 MG PO TABS: 1.0000 mg | ORAL_TABLET | ORAL | Status: DC | PRN

## 2023-06-23 MED ORDER — HALOPERIDOL 0.5 MG PO TABS: 0.5000 mg | ORAL_TABLET | ORAL | Status: DC | PRN

## 2023-06-23 MED ORDER — HALOPERIDOL LACTATE 2 MG/ML PO CONC: 0.5000 mg | ORAL | Status: DC | PRN

## 2023-06-23 MED ORDER — POLYVINYL ALCOHOL 1.4 % OP SOLN: 1.0000 [drp] | Freq: Four times a day (QID) | OPHTHALMIC | Status: DC | PRN

## 2023-06-23 MED ORDER — GLYCOPYRROLATE 0.2 MG/ML IJ SOLN
0.2000 mg | INTRAMUSCULAR | Status: DC | PRN
  Administered 2023-06-29: 0.2 mg via INTRAVENOUS
  Filled 2023-06-23: qty 1

## 2023-06-23 MED ORDER — DIAZEPAM 5 MG/ML IJ SOLN
2.5000 mg | INTRAMUSCULAR | Status: DC | PRN
  Administered 2023-06-26 – 2023-06-29 (×4): 2.5 mg via INTRAVENOUS
  Filled 2023-06-23 (×4): qty 2

## 2023-06-23 NOTE — Assessment & Plan Note (Signed)
Dysphagia. Complex paraesophageal hernia sp repair 01/2022.  Patient not candidate for repeat invasive procedures due to risk of worsening respiratory failure.  Continue with comfort care.

## 2023-06-23 NOTE — Assessment & Plan Note (Addendum)
Acute hypoxemic respiratory failure with hypoxemia.   Patient with poor prognosis, very deconditioned.  She was treated with IV zosyn and supplemental 02 per [place].   Plan to continue supplemental 02 per [place] for comfort, she has been transitioned to comfort care.  IV hydromorphone to control dyspnea and pain.  Discontinue antibiotic therapy.

## 2023-06-23 NOTE — Assessment & Plan Note (Signed)
Blood pressure has been stable.  Holding antihypertensive agents, risk of hypotension.

## 2023-06-23 NOTE — Plan of Care (Signed)

## 2023-06-23 NOTE — Assessment & Plan Note (Signed)
Uncontrolled atrial fibrillation, currently no apparent symptoms from tachycardia. Continue comfort care.

## 2023-06-23 NOTE — Assessment & Plan Note (Signed)
No clinical signs of decompensation.  Currently off diuretic therapy. She has been transitioned to comfort care.

## 2023-06-23 NOTE — Progress Notes (Signed)
Progress Note   Patient: [name] [name] ZOX:[id number] DOB: Nov 06, 1941 DOA: 06/18/2023     5 DOS: the patient was seen and examined on 06/23/2023   Brief hospital course: 82 year old female with a history of chronic back pain status post fusion, chronic lower extremity edema, coronary artery disease, HFmrEF (50-55%), permanent atrial fibrillation, hypertension, hyperlipidemia, restless leg syndrome, esophageal stricture, paraesophageal hernia status postsurgical repair, depression/anxiety presenting with increasing difficulty swallowing over the past month, worsening over the past 2 days.    Notably, the patient had a paraesophageal hernia repair, Heller myotomy, and gastropexy at [facility] on 02/17/22.  The patient had a complex postoperative history.  She began experiencing dysphagia again at the end of March 2023.  She was readmitted back to [facility] from 03/17/2022 to 04/13/2022.  She had a prolonged hospitalization which was complicated by development of atrial fibrillation with RVR and aspiration pneumonia.  She required intubation because of respiratory failure from her aspiration.  Subsequently, she underwent EGD on 03/27/2022 which showed severe postoperative esophageal narrowing at the GE junction dilated with a 10 to 12 mm CRE dilatation.  There is food noted in the stomach with obstruction views of the cardia.  After the EGD, the patient experienced pharyngoesophageal deficits impacting her swallowing.   After goals of care discussion with the patient's family they elected to proceed with a gastrostomy tube placement which was performed on 04/07/2022.  She was started on enteral feedings which she tolerated and ultimately discharged to a skilled nursing facility on 04/13/2022.   Thereafter, pt was ultimate evaluated by ENT and underwent FEES on 06/16/22 which suggested pt could begin liquid diet.  She continued to follow up with speech therapy with serial MBS studies and her diet was gradually  advanced.  Gastrostomy tube was removed in December 2023, and the patient has been eating by mouth since that period of time.  However over the next several months, she has noted increasing dysphagia type symptoms.  This has been worsening despite crushing her pills and eating softer food.  She has had to basically tolerate what amounts to a full liquid and soft diet.  However in the past few days prior to admission she has had increasing regurgitation, hypersalivation, and dry heaving.  She has had difficulty tolerating her pills.  She has had increasing shortness of breath over the past few days.  She denies any frank chest pain, fevers, chills, diarrhea, hematochezia, melena.  She has had some urinary frequency but denies any frank dysuria.  There is no hematemesis. Because she has had increasing difficulty tolerating pills she has not been able to take her cardiac medications or pain medications. In the ED, the patient was afebrile, but tachycardic in the 140s.  She was hemodynamically stable.  Oxygen saturation was 87% on 2 L.  She was placed on nasal cannula.  WBC 6.5, hemoglobin 9.6, platelets 171.  Sodium 131, potassium 3.7, bicarbonate 24, serum creatinine 0.50.  AST 216, ALT 210, alk phos 50-50, total bili 0.8.  EKG showed atrial fibrillation with RVR.  There is no ST-T wave changes.  CT chest and abdomen showed multiple airspace opacities bilaterally consistent with multifocal pneumonia.  There is moderate renal atrophy.  There is pancreatic atrophy.  There is a periumbilical hernia.  Stomach was within normal limits.  There is no bowel obstruction.  She was started on diltiazem drip, ceftriaxone, azithromycin.  Patient was diagnosed with aspiration pneumonia.  She was placed on IV antibiotic therapy. Unfortunately patient not  improving and very debilitated from her comorbid medical conditions.  Her family has decided to transition to comfort care.    Assessment and Plan: * Permanent atrial  fibrillation (HCC) Uncontrolled atrial fibrillation, currently no apparent symptoms from tachycardia. Continue comfort care.   Aspiration pneumonia (HCC) Acute hypoxemic respiratory failure with hypoxemia.   Patient with poor prognosis, very deconditioned.  She was treated with IV zosyn and supplemental 02 per [place].   Plan to continue supplemental 02 per  for comfort, she has been transitioned to comfort care.  IV hydromorphone to control dyspnea and pain.  Discontinue antibiotic therapy.  ESOPHAGEAL STRICTURE Dysphagia. Complex paraesophageal hernia sp repair 01/2022.  Patient not candidate for repeat invasive procedures due to risk of worsening respiratory failure.  Continue with comfort care.   Chronic diastolic CHF (congestive heart failure) (HCC) No clinical signs of decompensation.  Currently off diuretic therapy. She has been transitioned to comfort care.   Essential hypertension Blood pressure has been stable.  Holding antihypertensive agents, risk of hypotension.   Severe protein-calorie malnutrition (HCC) Poor prognosis, po intake as tolerated.  Aspiration precautions.   Major depression, recurrent, chronic (HCC) Chronic pain syndrome.   Patient on fentanyl at home, along with gabapentin.  She has been transitioned to hydromorphone IV infusion for comfort care.   CAD (coronary artery disease) No chest pain or evidence of acute coronary syndrome.   Hyponatremia Hypokalemia.   Very poor oral intake, no further blood work, patient under comfort care.         Subjective: Patient today noted to be more comfortable, her son and daughter are at the bedside, confirm comfort care.   Physical Exam: Vitals:   06/23/23 0400 06/23/23 0700 06/23/23 0800 06/23/23 0900  BP:      Pulse: 77 (!) 104 (!) 57 90  Resp: 12 10 10 13   Temp:  97.9 F (36.6 C)    TempSrc:  Oral    SpO2: 96% 96% 97% 96%  Weight:       Neurology somnolent, opens eyes to voice. Very weak  and deconditioned, ill looking appearing.  ENT with pallor, dry mucous membranes.  Cardiovascular with S1 and S2 present and rhythmic Respiratory with no rhonchi or rales, on anterior auscultation  Abdomen not distended Trace non pitting lower extremity edema  Data Reviewed:    Family Communication: I spoke with patient's daughter and son at the bedside, we talked in detail about patient's condition, plan of care and prognosis and all questions were addressed.   Disposition: Status is: Inpatient Remains inpatient appropriate because: imminent death   Planned Discharge Destination:  imminent death.      Author: Coralie Keens, MD 06/23/2023 9:37 AM  For on call review [email or web address].

## 2023-06-23 NOTE — Assessment & Plan Note (Addendum)
Hypokalemia.   Very poor oral intake, no further blood work, patient under comfort care.

## 2023-06-23 NOTE — Assessment & Plan Note (Signed)
Poor prognosis, po intake as tolerated.  Aspiration precautions.

## 2023-06-23 NOTE — Assessment & Plan Note (Addendum)
Chronic pain syndrome.   Patient on fentanyl at home, along with gabapentin.  She has been transitioned to hydromorphone IV infusion for comfort care.

## 2023-06-23 NOTE — Assessment & Plan Note (Signed)
No chest pain or evidence of acute coronary syndrome.

## 2023-06-23 NOTE — Progress Notes (Signed)
Palliative:  HPI: 82 y.o. female  with past medical history of chronic back pain status post fusion, chronic lower extremity edema, coronary artery disease, HFmrEF (50-55%), permanent atrial fibrillation, hypertension, hyperlipidemia, restless leg syndrome, esophageal stricture, paraesophageal hernia status postsurgical repair, Heller myotomy, gastropexy, G tube removed Jan 2024, depression/anxiety admitted on 06/18/2023 with dysphagia and poor intake, aspiration pneumonia.   I met today with [name] [name] children [name] [name] and [name] [name]. They express that [name] [name] has remained more comfortable but she has had some opportunity to awaken and interact with them and visitors at times. They confirm desire for full comfort care and infusions have been stopped. She is resting peacefully on dilaudid infusion. Family are supportive of her decisions. They share more about their health trials as a family over the past year. They are supportive to each other through this process. They understand that [name] [name] may be moved to a regular room out of ICU. We discussed likelihood of hospital death. They are still waiting for other family to come but they did not feel it was fair to [name] [name] to delay transition to comfort care as this was her desire.   All questions/concerns addressed. Emotional support provided. Discussed with RN and Dr. Ella Jubilee.   Exam: Sleeping peacefully. No distress. Pale. HR irreg, irreg and tachycardic. Breathing regular, unlabored. Abd soft.   Plan: - DNR - Comfort care - Anticipate hospital death - Continue medications for comfort care. Increase dosage/frequency as needed to ensure comfort.  50 min  Yong Channel, NP Palliative Medicine Team Pager [phone] (Please see [email or web address] for schedule) Team Phone [phone]    Greater than 50%  of this time was spent counseling and coordinating care related to the above assessment and plan

## 2023-06-24 DIAGNOSIS — Z515 Encounter for palliative care: Secondary | ICD-10-CM | POA: Diagnosis not present

## 2023-06-24 DIAGNOSIS — I5032 Chronic diastolic (congestive) heart failure: Secondary | ICD-10-CM | POA: Diagnosis not present

## 2023-06-24 DIAGNOSIS — I4821 Permanent atrial fibrillation: Secondary | ICD-10-CM | POA: Diagnosis not present

## 2023-06-24 DIAGNOSIS — J690 Pneumonitis due to inhalation of food and vomit: Secondary | ICD-10-CM | POA: Diagnosis not present

## 2023-06-24 DIAGNOSIS — I10 Essential (primary) hypertension: Secondary | ICD-10-CM | POA: Diagnosis not present

## 2023-06-24 MED ORDER — HYDROMORPHONE HCL PF 10 MG/ML IJ SOLN
INTRAMUSCULAR | Status: AC
  Filled 2023-06-24: qty 5

## 2023-06-24 MED ORDER — HYDROMORPHONE HCL-NACL 50-0.9 MG/50ML-% IV SOLN
6.0000 mg/h | INTRAVENOUS | Status: DC
  Administered 2023-06-25 (×2): 2 mg/h via INTRAVENOUS
  Administered 2023-06-26 (×2): 3 mg/h via INTRAVENOUS
  Administered 2023-06-27 – 2023-06-28 (×3): 4 mg/h via INTRAVENOUS
  Administered 2023-06-28: 5 mg/h via INTRAVENOUS
  Administered 2023-06-29: 6 mg/h via INTRAVENOUS
  Administered 2023-06-29: 5 mg/h via INTRAVENOUS
  Administered 2023-06-30: 6 mg/h via INTRAVENOUS
  Filled 2023-06-24 (×12): qty 50

## 2023-06-24 MED ORDER — DIAZEPAM 5 MG/ML IJ SOLN
2.5000 mg | Freq: Three times a day (TID) | INTRAMUSCULAR | Status: DC
  Administered 2023-06-24 – 2023-06-29 (×14): 2.5 mg via INTRAVENOUS
  Filled 2023-06-24 (×15): qty 2

## 2023-06-24 MED ORDER — GLYCOPYRROLATE 0.2 MG/ML IJ SOLN
0.2000 mg | Freq: Three times a day (TID) | INTRAMUSCULAR | Status: DC
  Administered 2023-06-24 – 2023-06-29 (×18): 0.2 mg via INTRAVENOUS
  Filled 2023-06-24 (×18): qty 1

## 2023-06-24 NOTE — Progress Notes (Signed)
Progress Note   Patient: [name] [name] ZOX:[id number] DOB: 19-Dec-1941 DOA: 06/18/2023     6 DOS: the patient was seen and examined on 06/24/2023   Brief hospital course: 82 year old female with a history of chronic back pain status post fusion, chronic lower extremity edema, coronary artery disease, HFmrEF (50-55%), permanent atrial fibrillation, hypertension, hyperlipidemia, restless leg syndrome, esophageal stricture, paraesophageal hernia status postsurgical repair, depression/anxiety presenting with increasing difficulty swallowing over the past month, worsening over the past 2 days.    Notably, the patient had a paraesophageal hernia repair, Heller myotomy, and gastropexy at [facility] on 02/17/22.  The patient had a complex postoperative history.  She began experiencing dysphagia again at the end of March 2023.  She was readmitted back to [facility] from 03/17/2022 to 04/13/2022.  She had a prolonged hospitalization which was complicated by development of atrial fibrillation with RVR and aspiration pneumonia.  She required intubation because of respiratory failure from her aspiration.  Subsequently, she underwent EGD on 03/27/2022 which showed severe postoperative esophageal narrowing at the GE junction dilated with a 10 to 12 mm CRE dilatation.  There is food noted in the stomach with obstruction views of the cardia.  After the EGD, the patient experienced pharyngoesophageal deficits impacting her swallowing.   After goals of care discussion with the patient's family they elected to proceed with a gastrostomy tube placement which was performed on 04/07/2022.  She was started on enteral feedings which she tolerated and ultimately discharged to a skilled nursing facility on 04/13/2022.   Thereafter, pt was ultimate evaluated by ENT and underwent FEES on 06/16/22 which suggested pt could begin liquid diet.  She continued to follow up with speech therapy with serial MBS studies and her diet was gradually  advanced.  Gastrostomy tube was removed in December 2023, and the patient has been eating by mouth since that period of time.  However over the next several months, she has noted increasing dysphagia type symptoms.  This has been worsening despite crushing her pills and eating softer food.  She has had to basically tolerate what amounts to a full liquid and soft diet.  However in the past few days prior to admission she has had increasing regurgitation, hypersalivation, and dry heaving.  She has had difficulty tolerating her pills.  She has had increasing shortness of breath over the past few days.  She denies any frank chest pain, fevers, chills, diarrhea, hematochezia, melena.  She has had some urinary frequency but denies any frank dysuria.  There is no hematemesis. Because she has had increasing difficulty tolerating pills she has not been able to take her cardiac medications or pain medications. In the ED, the patient was afebrile, but tachycardic in the 140s.  She was hemodynamically stable.  Oxygen saturation was 87% on 2 L.  She was placed on nasal cannula.  WBC 6.5, hemoglobin 9.6, platelets 171.  Sodium 131, potassium 3.7, bicarbonate 24, serum creatinine 0.50.  AST 216, ALT 210, alk phos 50-50, total bili 0.8.  EKG showed atrial fibrillation with RVR.  There is no ST-T wave changes.  CT chest and abdomen showed multiple airspace opacities bilaterally consistent with multifocal pneumonia.  There is moderate renal atrophy.  There is pancreatic atrophy.  There is a periumbilical hernia.  Stomach was within normal limits.  There is no bowel obstruction.  She was started on diltiazem drip, ceftriaxone, azithromycin.  Patient was diagnosed with aspiration pneumonia.  She was placed on IV antibiotic therapy. Unfortunately patient not  improving and very debilitated from her comorbid medical conditions.  Her family has decided to transition to comfort care.    Assessment and Plan: * Permanent atrial  fibrillation (HCC) Uncontrolled atrial fibrillation, currently no apparent symptoms from tachycardia. Continue comfort care.   Aspiration pneumonia (HCC) Acute hypoxemic respiratory failure with hypoxemia.   Patient with poor prognosis, very deconditioned.  She was treated with IV zosyn and supplemental 02 per [place].   Plan to continue supplemental 02 per [place] for comfort, she has been transitioned to comfort care.  IV hydromorphone to control dyspnea and pain.  Discontinue antibiotic therapy.  ESOPHAGEAL STRICTURE Dysphagia. Complex paraesophageal hernia sp repair 01/2022.  Patient not candidate for repeat invasive procedures due to risk of worsening respiratory failure.  Continue with comfort care.   Chronic diastolic CHF (congestive heart failure) (HCC) No clinical signs of decompensation.  Currently off diuretic therapy. She has been transitioned to comfort care.   Essential hypertension Blood pressure has been stable.  Holding antihypertensive agents, risk of hypotension.   Severe protein-calorie malnutrition (HCC) Poor prognosis, po intake as tolerated.  Aspiration precautions.   Major depression, recurrent, chronic (HCC) Chronic pain syndrome.   Patient on fentanyl at home, along with gabapentin.  She has been transitioned to hydromorphone IV infusion for comfort care.   CAD (coronary artery disease) No chest pain or evidence of acute coronary syndrome.   Hyponatremia Hypokalemia.   Very poor oral intake, no further blood work, patient under comfort care.         Subjective: Patient has been comfortable on current medical therapy, her family is at the bedside   Physical Exam: Vitals:   06/23/23 0900 06/23/23 1500 06/23/23 2031 06/24/23 1100  BP:   (!) 123/96 (!) 158/145  Pulse: 90 (!) 112 92 (!) 52  Resp: 13 10 14 12   Temp:   99.1 F (37.3 C) 99.2 F (37.3 C)  TempSrc:   Oral Axillary  SpO2: 96%  94% 96%  Weight:       Neurology sedated and poorly  responsive Not in acute distress No use of accessory respiratory muscles.  No significant airway secretions  Data Reviewed:    Family Communication: I spoke with patient's family at the bedside, we talked in detail about patient's condition, plan of care and prognosis and all questions were addressed.   Disposition: Status is: Inpatient Remains inpatient appropriate because: end of life care  Planned Discharge Destination:  imminent death      Author: Coralie Keens, MD 06/24/2023 3:37 PM  For on call review [email or web address].

## 2023-06-24 NOTE — Progress Notes (Signed)
Palliative:  HPI: 82 y.o. female with past medical history of chronic back pain status post fusion, chronic lower extremity edema, coronary artery disease, HFmrEF (50-55%), permanent atrial fibrillation, hypertension, hyperlipidemia, restless leg syndrome, esophageal stricture, paraesophageal hernia status postsurgical repair, Heller myotomy, gastropexy, G tube removed Jan 2024, depression/anxiety admitted on 06/18/2023 with dysphagia and poor intake, aspiration pneumonia.   I met today at [name] [name]'s bedside with daughter, [name] [name]. [name] [name] rests peacefully as [name] [name] shares that they had a restful night. She reports some mumbling on occasion but overall very comfortable. We discussed some scheduled Valium to help ensure comfort care. [name] [name] explains that her uncle was able to visit. [name] [name] has been able to have good interactions with her family and friends over the past couple days. [name] [name] is glad that her mother is comfortable and at peace.   Exam: Resting peacefully. Eyes open briefly but no tracking. Occasional grimace (will add scheduled Valium). Mumbles transiently. No distress.   Plan: - DNR - Comfort care - Anticipate hospital death - Continue medications for comfort care. Increase dosage/frequency as needed to ensure comfort.   25 min  Yong Channel, NP Palliative Medicine Team Pager [phone] (Please see [email or web address] for schedule) Team Phone [phone]    Greater than 50%  of this time was spent counseling and coordinating care related to the above assessment and plan

## 2023-06-24 NOTE — Progress Notes (Signed)
Patient seemingly comfortable through this shift with family at bedside. No verbal signs of pain or discomfort. Patient was not turned or moved unless necessary at families request.

## 2023-06-24 NOTE — Progress Notes (Signed)
Pt has rested comfortably through the night. One bolus given after cleaning pt up in the a.m.

## 2023-06-24 NOTE — Progress Notes (Signed)
Hydromorophone infusion will be changed to premixed bag starting tonight at midnight . Available in the pyxis once current bag is empty

## 2023-06-25 DIAGNOSIS — I4821 Permanent atrial fibrillation: Secondary | ICD-10-CM | POA: Diagnosis not present

## 2023-06-25 DIAGNOSIS — J690 Pneumonitis due to inhalation of food and vomit: Secondary | ICD-10-CM | POA: Diagnosis not present

## 2023-06-25 DIAGNOSIS — I10 Essential (primary) hypertension: Secondary | ICD-10-CM | POA: Diagnosis not present

## 2023-06-25 DIAGNOSIS — E43 Unspecified severe protein-calorie malnutrition: Secondary | ICD-10-CM | POA: Diagnosis not present

## 2023-06-25 NOTE — Progress Notes (Signed)
Nutrition Brief Note  Chart reviewed. Pt has transitioned to comfort care.  No further nutrition interventions planned at this time.  Please re-consult as needed.   Royann Shivers RD

## 2023-06-25 NOTE — Progress Notes (Signed)
Patient resting comfortably with family at bedside. PRN pain medications given around scheduled medications throughout the day. Support given to family members. Bath and repositioning offered and family declined at this time but would let us know if they changed their minds.

## 2023-06-25 NOTE — Progress Notes (Signed)
Progress Note   Patient: [name] [name] ZOX:[id number] DOB: Oct 20, 1941 DOA: 06/09/2023     7 DOS: the patient was seen and examined on 06/25/2023   Brief hospital course: 82 year old female with a history of chronic back pain status post fusion, chronic lower extremity edema, coronary artery disease, HFmrEF (50-55%), permanent atrial fibrillation, hypertension, hyperlipidemia, restless leg syndrome, esophageal stricture, paraesophageal hernia status postsurgical repair, depression/anxiety presenting with increasing difficulty swallowing over the past month, worsening over the past 2 days.    Notably, the patient had a paraesophageal hernia repair, Heller myotomy, and gastropexy at [facility] on 02/17/22.  The patient had a complex postoperative history.  She began experiencing dysphagia again at the end of March 2023.  She was readmitted back to [facility] from 03/17/2022 to 04/13/2022.  She had a prolonged hospitalization which was complicated by development of atrial fibrillation with RVR and aspiration pneumonia.  She required intubation because of respiratory failure from her aspiration.  Subsequently, she underwent EGD on 03/27/2022 which showed severe postoperative esophageal narrowing at the GE junction dilated with a 10 to 12 mm CRE dilatation.  There is food noted in the stomach with obstruction views of the cardia.  After the EGD, the patient experienced pharyngoesophageal deficits impacting her swallowing.   After goals of care discussion with the patient's family they elected to proceed with a gastrostomy tube placement which was performed on 04/07/2022.  She was started on enteral feedings which she tolerated and ultimately discharged to a skilled nursing facility on 04/13/2022.   Thereafter, pt was ultimate evaluated by ENT and underwent FEES on 06/16/22 which suggested pt could begin liquid diet.  She continued to follow up with speech therapy with serial MBS studies and her diet was gradually  advanced.  Gastrostomy tube was removed in December 2023, and the patient has been eating by mouth since that period of time.  However over the next several months, she has noted increasing dysphagia type symptoms.  This has been worsening despite crushing her pills and eating softer food.  She has had to basically tolerate what amounts to a full liquid and soft diet.  However in the past few days prior to admission she has had increasing regurgitation, hypersalivation, and dry heaving.  She has had difficulty tolerating her pills.  She has had increasing shortness of breath over the past few days.  She denies any frank chest pain, fevers, chills, diarrhea, hematochezia, melena.  She has had some urinary frequency but denies any frank dysuria.  There is no hematemesis. Because she has had increasing difficulty tolerating pills she has not been able to take her cardiac medications or pain medications. In the ED, the patient was afebrile, but tachycardic in the 140s.  She was hemodynamically stable.  Oxygen saturation was 87% on 2 L.  She was placed on nasal cannula.  WBC 6.5, hemoglobin 9.6, platelets 171.  Sodium 131, potassium 3.7, bicarbonate 24, serum creatinine 0.50.  AST 216, ALT 210, alk phos 50-50, total bili 0.8.  EKG showed atrial fibrillation with RVR.  There is no ST-T wave changes.  CT chest and abdomen showed multiple airspace opacities bilaterally consistent with multifocal pneumonia.  There is moderate renal atrophy.  There is pancreatic atrophy.  There is a periumbilical hernia.  Stomach was within normal limits.  There is no bowel obstruction.  She was started on diltiazem drip, ceftriaxone, azithromycin.  Patient was diagnosed with aspiration pneumonia.  She was placed on IV antibiotic therapy. Unfortunately patient not  improving and very debilitated from her comorbid medical conditions.  Her family has decided to transition to comfort care.    Assessment and Plan: * Permanent atrial  fibrillation (HCC) Uncontrolled atrial fibrillation, currently no apparent symptoms from tachycardia. Continue comfort care.   Aspiration pneumonia (HCC) Acute hypoxemic respiratory failure with hypoxemia.   Patient with poor prognosis, very deconditioned.  She was treated with IV zosyn and supplemental 02 per [place].   Plan to continue supplemental 02 per [place] for comfort, she has been transitioned to comfort care.  IV hydromorphone to control dyspnea and pain.  Discontinue antibiotic therapy.  ESOPHAGEAL STRICTURE Dysphagia. Complex paraesophageal hernia sp repair 01/2022.  Patient not candidate for repeat invasive procedures due to risk of worsening respiratory failure.  Continue with comfort care.   Chronic diastolic CHF (congestive heart failure) (HCC) No clinical signs of decompensation.  Currently off diuretic therapy. She has been transitioned to comfort care.   Essential hypertension Blood pressure has been stable.  Holding antihypertensive agents, risk of hypotension.   Severe protein-calorie malnutrition (HCC) Poor prognosis, po intake as tolerated.  Aspiration precautions.   Major depression, recurrent, chronic (HCC) Chronic pain syndrome.   Patient on fentanyl at home, along with gabapentin.  She has been transitioned to hydromorphone IV infusion for comfort care.   CAD (coronary artery disease) No chest pain or evidence of acute coronary syndrome.   Hyponatremia Hypokalemia.   Very poor oral intake, no further blood work, patient under comfort care.         Subjective: Patient continue under comfort care, her family is at the bedside   Physical Exam: Vitals:   07/19/2023 0900 07/05/2023 1500 06/28/2023 2031 06/24/23 1100  BP:   (!) 123/96 (!) 158/145  Pulse: 90 (!) 112 92 (!) 52  Resp: 13 10 14 12   Temp:   99.1 F (37.3 C) 99.2 F (37.3 C)  TempSrc:   Oral Axillary  SpO2: 96%  94% 96%  Weight:       Patient sedated and not responsive Not  interactive at the time of my visit No signs of pain or respiratory distress.  Data Reviewed:    Family Communication: I spoke with patient's family at the bedside, we talked in detail about patient's condition, plan of care and prognosis and all questions were addressed.   Disposition: Status is: Inpatient Remains inpatient appropriate because: patient under comfort care, imminent death   Planned Discharge Destination:  imminent death     Author: Coralie Keens, MD 06/25/2023 5:24 PM  For on call review [email or web address].

## 2023-06-25 NOTE — Progress Notes (Signed)
Patient seemed comfortable to this nurse and patients family this shift. Family requested to no move patient unless needed to clean her. Fentanyl given twice this shift per daughter request for patient beginning to seem uncomfortable. New IV Dilaudid hung with second verifying nurse [place]. Continued to monitor patient and offer comfort to family.

## 2023-06-26 DIAGNOSIS — K222 Esophageal obstruction: Secondary | ICD-10-CM

## 2023-06-26 DIAGNOSIS — I10 Essential (primary) hypertension: Secondary | ICD-10-CM | POA: Diagnosis not present

## 2023-06-26 DIAGNOSIS — I5032 Chronic diastolic (congestive) heart failure: Secondary | ICD-10-CM | POA: Diagnosis not present

## 2023-06-26 DIAGNOSIS — I4821 Permanent atrial fibrillation: Secondary | ICD-10-CM | POA: Diagnosis not present

## 2023-06-26 NOTE — Progress Notes (Signed)
Family reported increase in moaning/ groaning and facial grimacing. PRN Fentanyl given with PAINAD score of 4. PAINAD score remained 4 after pain reassessment, Dilaudid dose/ rate changed to 3 mg/ hr. Pt has rested comfortably with family at bedside the remainder of the night.

## 2023-06-26 NOTE — Plan of Care (Signed)

## 2023-06-26 NOTE — Progress Notes (Signed)
Progress Note   Patient: [name] [name] ZDG:[id number] DOB: 1941-06-06 DOA: 06/07/2023     8 DOS: the patient was seen and examined on 06/26/2023   Brief hospital course: 82 year old female with a history of chronic back pain status post fusion, chronic lower extremity edema, coronary artery disease, HFmrEF (50-55%), permanent atrial fibrillation, hypertension, hyperlipidemia, restless leg syndrome, esophageal stricture, paraesophageal hernia status postsurgical repair, depression/anxiety presenting with increasing difficulty swallowing over the past month, worsening over the past 2 days.    Notably, the patient had a paraesophageal hernia repair, Heller myotomy, and gastropexy at [facility] on 02/17/22.  The patient had a complex postoperative history.  She began experiencing dysphagia again at the end of March 2023.  She was readmitted back to [facility] from 03/17/2022 to 04/13/2022.  She had a prolonged hospitalization which was complicated by development of atrial fibrillation with RVR and aspiration pneumonia.  She required intubation because of respiratory failure from her aspiration.  Subsequently, she underwent EGD on 03/27/2022 which showed severe postoperative esophageal narrowing at the GE junction dilated with a 10 to 12 mm CRE dilatation.  There is food noted in the stomach with obstruction views of the cardia.  After the EGD, the patient experienced pharyngoesophageal deficits impacting her swallowing.   After goals of care discussion with the patient's family they elected to proceed with a gastrostomy tube placement which was performed on 04/07/2022.  She was started on enteral feedings which she tolerated and ultimately discharged to a skilled nursing facility on 04/13/2022.   Thereafter, pt was ultimate evaluated by ENT and underwent FEES on 06/16/22 which suggested pt could begin liquid diet.  She continued to follow up with speech therapy with serial MBS studies and her diet was gradually  advanced.  Gastrostomy tube was removed in December 2023, and the patient has been eating by mouth since that period of time.  However over the next several months, she has noted increasing dysphagia type symptoms.  This has been worsening despite crushing her pills and eating softer food.  She has had to basically tolerate what amounts to a full liquid and soft diet.  However in the past few days prior to admission she has had increasing regurgitation, hypersalivation, and dry heaving.  She has had difficulty tolerating her pills.  She has had increasing shortness of breath over the past few days.  She denies any frank chest pain, fevers, chills, diarrhea, hematochezia, melena.  She has had some urinary frequency but denies any frank dysuria.  There is no hematemesis. Because she has had increasing difficulty tolerating pills she has not been able to take her cardiac medications or pain medications. In the ED, the patient was afebrile, but tachycardic in the 140s.  She was hemodynamically stable.  Oxygen saturation was 87% on 2 L.  She was placed on nasal cannula.  WBC 6.5, hemoglobin 9.6, platelets 171.  Sodium 131, potassium 3.7, bicarbonate 24, serum creatinine 0.50.  AST 216, ALT 210, alk phos 50-50, total bili 0.8.  EKG showed atrial fibrillation with RVR.  There is no ST-T wave changes.  CT chest and abdomen showed multiple airspace opacities bilaterally consistent with multifocal pneumonia.  There is moderate renal atrophy.  There is pancreatic atrophy.  There is a periumbilical hernia.  Stomach was within normal limits.  There is no bowel obstruction.  She was started on diltiazem drip, ceftriaxone, azithromycin.  Patient was diagnosed with aspiration pneumonia.  She was placed on IV antibiotic therapy. Unfortunately patient not  improving and very debilitated from her comorbid medical conditions.  Her family has decided to transition to comfort care.    Assessment and Plan: * Permanent atrial  fibrillation (HCC) Uncontrolled atrial fibrillation, currently no apparent symptoms from tachycardia. Continue comfort care.   Aspiration pneumonia (HCC) Acute hypoxemic respiratory failure with hypoxemia.   Patient with poor prognosis, very deconditioned.  She was treated with IV zosyn and supplemental 02 per [place].   Plan to continue supplemental 02 per [place] for comfort, she has been transitioned to comfort care.  IV hydromorphone to control dyspnea and pain.  Discontinue antibiotic therapy.  ESOPHAGEAL STRICTURE Dysphagia. Complex paraesophageal hernia sp repair 01/2022.  Patient not candidate for repeat invasive procedures due to risk of worsening respiratory failure.  Continue with comfort care.   Chronic diastolic CHF (congestive heart failure) (HCC) No clinical signs of decompensation.  Currently off diuretic therapy. She has been transitioned to comfort care.   Essential hypertension Blood pressure has been stable.  Holding antihypertensive agents, risk of hypotension.   Severe protein-calorie malnutrition (HCC) Poor prognosis, po intake as tolerated.  Aspiration precautions.   Major depression, recurrent, chronic (HCC) Chronic pain syndrome.   Patient on fentanyl at home, along with gabapentin.  She has been transitioned to hydromorphone IV infusion for comfort care.   CAD (coronary artery disease) No chest pain or evidence of acute coronary syndrome.   Hyponatremia Hypokalemia.   Very poor oral intake, no further blood work, patient under comfort care.    Subjective: Family at bedside; no eating, not drinking, not following commands or answering any questions.  Intermittent moaning and agonal breathing appreciated.  Physical Exam: Vitals:   06/22/2023 0900 07/18/2023 1500 06/21/2023 2031 06/24/23 1100  BP:   (!) 123/96 (!) 158/145  Pulse: 90 (!) 112 92 (!) 52  Resp: 13 10 14 12   Temp:   99.1 F (37.3 C) 99.2 F (37.3 C)  TempSrc:   Oral Axillary  SpO2: 96%   94% 96%  Weight:       General exam: Currently nonresponsive; intermittent moaning and agonal breathing appreciated. Respiratory system: Comfort oxygen supplementation in place; positive rhonchi appreciated bilaterally.  Agonal breathing appreciated on examination. Cardiovascular system: No rubs, no gallops, no JVD. Gastrointestinal system: Abdomen is nondistended, soft and nontender.  Positive sounds. Central nervous system: Alert and oriented. No focal neurological deficits. Extremities: No cyanosis or clubbing. Skin: No petechiae.  Data Reviewed: No further blood work; patient is full comfort.   Family Communication: I spoke with patient's family at the bedside, we talked in detail about patient's condition, plan of care and prognosis and all questions were addressed.   Disposition: Status is: Inpatient Remains inpatient appropriate because: patient under comfort care, imminent death.  Continue full comfort care.   Planned Discharge Destination:  imminent death   Author: Vassie Loll, MD 06/26/2023 7:38 PM  For on call review [email or web address].

## 2023-06-27 DIAGNOSIS — I4821 Permanent atrial fibrillation: Secondary | ICD-10-CM | POA: Diagnosis not present

## 2023-06-27 DIAGNOSIS — K222 Esophageal obstruction: Secondary | ICD-10-CM | POA: Diagnosis not present

## 2023-06-27 DIAGNOSIS — I10 Essential (primary) hypertension: Secondary | ICD-10-CM | POA: Diagnosis not present

## 2023-06-27 DIAGNOSIS — I5032 Chronic diastolic (congestive) heart failure: Secondary | ICD-10-CM | POA: Diagnosis not present

## 2023-06-27 NOTE — Progress Notes (Signed)
Pt rested comfortably during the night with family present at bedside. Emotional support given to family.

## 2023-06-27 NOTE — Progress Notes (Signed)
Family at bedside throughout the day. Patient with irregular breathing patterns and at times moaning. Dilaudid increase to 4mg /hr and bolus given X2 this shift. At the request of the family O2 was decreased to 4l. I have spent time with family to comfort and educate on the patients status and end of life process.

## 2023-06-27 NOTE — Progress Notes (Signed)
Bolus given at family request for signs of discomfort. Patient moaning when I entered the room.

## 2023-06-27 NOTE — Progress Notes (Signed)
Progress Note   Patient: [name] [name] JWJ:[id number] DOB: 1941/06/30 DOA: 06/04/2023     9 DOS: the patient was seen and examined on 06/27/2023   Brief hospital course: 82 year old female with a history of chronic back pain status post fusion, chronic lower extremity edema, coronary artery disease, HFmrEF (50-55%), permanent atrial fibrillation, hypertension, hyperlipidemia, restless leg syndrome, esophageal stricture, paraesophageal hernia status postsurgical repair, depression/anxiety presenting with increasing difficulty swallowing over the past month, worsening over the past 2 days.    Notably, the patient had a paraesophageal hernia repair, Heller myotomy, and gastropexy at [facility] on 02/17/22.  The patient had a complex postoperative history.  She began experiencing dysphagia again at the end of March 2023.  She was readmitted back to [facility] from 03/17/2022 to 04/13/2022.  She had a prolonged hospitalization which was complicated by development of atrial fibrillation with RVR and aspiration pneumonia.  She required intubation because of respiratory failure from her aspiration.  Subsequently, she underwent EGD on 03/27/2022 which showed severe postoperative esophageal narrowing at the GE junction dilated with a 10 to 12 mm CRE dilatation.  There is food noted in the stomach with obstruction views of the cardia.  After the EGD, the patient experienced pharyngoesophageal deficits impacting her swallowing.   After goals of care discussion with the patient's family they elected to proceed with a gastrostomy tube placement which was performed on 04/07/2022.  She was started on enteral feedings which she tolerated and ultimately discharged to a skilled nursing facility on 04/13/2022.   Thereafter, pt was ultimate evaluated by ENT and underwent FEES on 06/16/22 which suggested pt could begin liquid diet.  She continued to follow up with speech therapy with serial MBS studies and her diet was gradually  advanced.  Gastrostomy tube was removed in December 2023, and the patient has been eating by mouth since that period of time.  However over the next several months, she has noted increasing dysphagia type symptoms.  This has been worsening despite crushing her pills and eating softer food.  She has had to basically tolerate what amounts to a full liquid and soft diet.  However in the past few days prior to admission she has had increasing regurgitation, hypersalivation, and dry heaving.  She has had difficulty tolerating her pills.  She has had increasing shortness of breath over the past few days.  She denies any frank chest pain, fevers, chills, diarrhea, hematochezia, melena.  She has had some urinary frequency but denies any frank dysuria.  There is no hematemesis. Because she has had increasing difficulty tolerating pills she has not been able to take her cardiac medications or pain medications. In the ED, the patient was afebrile, but tachycardic in the 140s.  She was hemodynamically stable.  Oxygen saturation was 87% on 2 L.  She was placed on nasal cannula.  WBC 6.5, hemoglobin 9.6, platelets 171.  Sodium 131, potassium 3.7, bicarbonate 24, serum creatinine 0.50.  AST 216, ALT 210, alk phos 50-50, total bili 0.8.  EKG showed atrial fibrillation with RVR.  There is no ST-T wave changes.  CT chest and abdomen showed multiple airspace opacities bilaterally consistent with multifocal pneumonia.  There is moderate renal atrophy.  There is pancreatic atrophy.  There is a periumbilical hernia.  Stomach was within normal limits.  There is no bowel obstruction.  She was started on diltiazem drip, ceftriaxone, azithromycin.  Patient was diagnosed with aspiration pneumonia.  She was placed on IV antibiotic therapy. Unfortunately patient not  improving and very debilitated from her comorbid medical conditions.  Her family has decided to transition to comfort care.    Assessment and Plan: * Permanent atrial  fibrillation (HCC) Uncontrolled atrial fibrillation, currently no apparent symptoms from tachycardia. Continue comfort care.   Aspiration pneumonia (HCC) Acute hypoxemic respiratory failure with hypoxemia.   Patient with poor prognosis, very deconditioned.  She was treated with IV zosyn and supplemental 02 per [place].   Plan to continue supplemental 02 per [place] for comfort, she has been transitioned to comfort care.  IV hydromorphone to control dyspnea and pain.  Discontinue antibiotic therapy.  ESOPHAGEAL STRICTURE Dysphagia. Complex paraesophageal hernia sp repair 01/2022.  Patient not candidate for repeat invasive procedures due to risk of worsening respiratory failure.  Continue with comfort care.   Chronic diastolic CHF (congestive heart failure) (HCC) No clinical signs of decompensation.  Currently off diuretic therapy. She has been transitioned to comfort care.   Essential hypertension Blood pressure has been stable.  Holding antihypertensive agents, risk of hypotension.   Severe protein-calorie malnutrition (HCC) Poor prognosis, po intake as tolerated.  Aspiration precautions.   Major depression, recurrent, chronic (HCC) Chronic pain syndrome.   Patient on fentanyl at home, along with gabapentin.  She has been transitioned to hydromorphone IV infusion for comfort care.   CAD (coronary artery disease) No chest pain or evidence of acute coronary syndrome.   Hyponatremia Hypokalemia.   Very poor oral intake, no further blood work, patient under comfort care.    Subjective: No overnight events; remains unresponsive, no following commands, no eating, not drinking.  In no acute distress and appears to be comfortable.  Physical Exam: Vitals:   07/16/2023 0900 06/27/2023 1500 06/29/2023 2031 06/24/23 1100  BP:   (!) 123/96 (!) 158/145  Pulse: 90 (!) 112 92 (!) 52  Resp: 13 10 14 12   Temp:   99.1 F (37.3 C) 99.2 F (37.3 C)  TempSrc:   Oral Axillary  SpO2: 96%  94%  96%  Weight:       Patient rested comfortably during the night with family at bedside; remains nonresponsive, no eating, not drinking and demonstrating intermittent abdominal breathing events.  Afebrile and appears to be in no acute distress.  Data Reviewed: No further blood work; patient is full comfort.   Family Communication: I spoke with patient's family at the bedside, we talked in detail about patient's condition, plan of care and prognosis and all questions were addressed.   Disposition: Status is: Inpatient Remains inpatient appropriate because: patient under comfort care, imminent death.  Continue full comfort care.   Planned Discharge Destination:  imminent death   Author: Vassie Loll, MD 06/27/2023 10:44 AM  For on call review [email or web address].

## 2023-06-28 DIAGNOSIS — I10 Essential (primary) hypertension: Secondary | ICD-10-CM | POA: Diagnosis not present

## 2023-06-28 DIAGNOSIS — I5032 Chronic diastolic (congestive) heart failure: Secondary | ICD-10-CM | POA: Diagnosis not present

## 2023-06-28 DIAGNOSIS — I4821 Permanent atrial fibrillation: Secondary | ICD-10-CM | POA: Diagnosis not present

## 2023-06-28 DIAGNOSIS — K222 Esophageal obstruction: Secondary | ICD-10-CM | POA: Diagnosis not present

## 2023-06-28 NOTE — Progress Notes (Signed)
Palliative: Chart review completed.  Mrs. Isaza has been transitioned to full comfort care.  End-of-life order set in place.  No needs identified at this time.  Conference with attending, bedside nursing staff, transition of care team related to patient condition, needs goals of care, disposition.  Plan: Full comfort care.  End-of-life order set in place.  Anticipate in-hospital death.  No charge Lillia Carmel, NP Palliative medicine team Team phone [phone] Greater than 50% of this time was spent counseling and coordinating care related to the above assessment and plan.

## 2023-06-28 NOTE — Progress Notes (Signed)
Patient is resting in bed at this time. Patient daughter and son remain at bedside.

## 2023-06-28 NOTE — Progress Notes (Signed)
   06/28/23 1456  Spiritual Encounters  Type of Visit Follow up  Care provided to: Patient;Family  Conversation partners present during encounter Nurse  Referral source IDT Rounds  Reason for visit End-of-life  OnCall Visit No  Spiritual Framework  Presenting Themes Meaning/purpose/sources of inspiration;Impactful experiences and emotions;Courage hope and growth;Values and beliefs;Significant life change  Community/Connection Family;Friend(s);Faith community  Patient Stress Factors Major life changes  Family Stress Factors Loss  Interventions  Spiritual Care Interventions Made Compassionate presence;Reflective listening;Narrative/life review;Meaning making;Bereavement/grief support;Prayer;Supported grief process  Intervention Outcomes  Outcomes Connection to spiritual care;Reduced anxiety  Spiritual Care Plan  Spiritual Care Issues Still Outstanding Chaplain will continue to follow   Referred to patient today during IDT rounds. Ms. Meek is an 82 year old mother of two children--a son and daughter both of whom have been present most of her hospitalization. She is not awake or alert and is actively dying. She exhibits no signs of pain or of being uncomfortable. Chaplain engaged family in life review around her upbringing, spiritual heritage, relationships, and illness story. Provided compassionate present, opportunity for meaning making prayer and supported family in their grief process today. They are coping well at this time. Will continue to visit in order to provide spiritual support and to assess for spiritual need.   Rev. Jolyn Lent, M.Div Chaplain

## 2023-06-28 NOTE — Progress Notes (Signed)
Progress Note   Patient: [name] [name] Datta [id number]:[id number] DOB: Jun 29, 1941 DOA: 05/27/2023     10 DOS: the patient was seen and examined on 06/28/2023   Brief hospital course: 82 year old female with a history of chronic back pain status post fusion, chronic lower extremity edema, coronary artery disease, HFmrEF (50-55%), permanent atrial fibrillation, hypertension, hyperlipidemia, restless leg syndrome, esophageal stricture, paraesophageal hernia status postsurgical repair, depression/anxiety presenting with increasing difficulty swallowing over the past month, worsening over the past 2 days.    Notably, the patient had a paraesophageal hernia repair, Heller myotomy, and gastropexy at [facility] on 02/17/22.  The patient had a complex postoperative history.  She began experiencing dysphagia again at the end of March 2023.  She was readmitted back to [facility] from 03/17/2022 to 04/13/2022.  She had a prolonged hospitalization which was complicated by development of atrial fibrillation with RVR and aspiration pneumonia.  She required intubation because of respiratory failure from her aspiration.  Subsequently, she underwent EGD on 03/27/2022 which showed severe postoperative esophageal narrowing at the GE junction dilated with a 10 to 12 mm CRE dilatation.  There is food noted in the stomach with obstruction views of the cardia.  After the EGD, the patient experienced pharyngoesophageal deficits impacting her swallowing.   After goals of care discussion with the patient's family they elected to proceed with a gastrostomy tube placement which was performed on 04/07/2022.  She was started on enteral feedings which she tolerated and ultimately discharged to a skilled nursing facility on 04/13/2022.   Thereafter, pt was ultimate evaluated by ENT and underwent FEES on 06/16/22 which suggested pt could begin liquid diet.  She continued to follow up with speech therapy with serial MBS studies and her diet was gradually  advanced.  Gastrostomy tube was removed in December 2023, and the patient has been eating by mouth since that period of time.  However over the next several months, she has noted increasing dysphagia type symptoms.  This has been worsening despite crushing her pills and eating softer food.  She has had to basically tolerate what amounts to a full liquid and soft diet.  However in the past few days prior to admission she has had increasing regurgitation, hypersalivation, and dry heaving.  She has had difficulty tolerating her pills.  She has had increasing shortness of breath over the past few days.  She denies any frank chest pain, fevers, chills, diarrhea, hematochezia, melena.  She has had some urinary frequency but denies any frank dysuria.  There is no hematemesis. Because she has had increasing difficulty tolerating pills she has not been able to take her cardiac medications or pain medications. In the ED, the patient was afebrile, but tachycardic in the 140s.  She was hemodynamically stable.  Oxygen saturation was 87% on 2 L.  She was placed on nasal cannula.  WBC 6.5, hemoglobin 9.6, platelets 171.  Sodium 131, potassium 3.7, bicarbonate 24, serum creatinine 0.50.  AST 216, ALT 210, alk phos 50-50, total bili 0.8.  EKG showed atrial fibrillation with RVR.  There is no ST-T wave changes.  CT chest and abdomen showed multiple airspace opacities bilaterally consistent with multifocal pneumonia.  There is moderate renal atrophy.  There is pancreatic atrophy.  There is a periumbilical hernia.  Stomach was within normal limits.  There is no bowel obstruction.  She was started on diltiazem drip, ceftriaxone, azithromycin.  Patient was diagnosed with aspiration pneumonia.  She was placed on IV antibiotic therapy. Unfortunately patient not  improving and very debilitated from her comorbid medical conditions.  Her family has decided to transition to comfort care.    Assessment and Plan: * Permanent atrial  fibrillation (HCC) Uncontrolled atrial fibrillation, currently no apparent symptoms from tachycardia. Continue comfort care.   Aspiration pneumonia (HCC) Acute hypoxemic respiratory failure with hypoxemia.   Patient with poor prognosis, very deconditioned.  She was treated with IV zosyn and supplemental 02 per [place].   Plan to continue supplemental 02 per [place] for comfort, she has been transitioned to comfort care.  IV hydromorphone to control dyspnea and pain.  All antibiotics have been discontinued.  ESOPHAGEAL STRICTURE Dysphagia. Complex paraesophageal hernia sp repair 01/2022.  Patient not candidate for repeat invasive procedures due to risk of worsening respiratory failure.  Continue with comfort care.   Chronic diastolic CHF (congestive heart failure) (HCC) No clinical signs of decompensation.  Currently off diuretic therapy. She has been transitioned to full comfort care.   Essential hypertension Blood pressure has been stable.  Holding antihypertensive agents, risk of hypotension.   Severe protein-calorie malnutrition (HCC) Poor prognosis, currently not eating -patient in comfort care.   Major depression, recurrent, chronic (HCC) Chronic pain syndrome.   Patient on fentanyl at home, along with gabapentin.  She has been transitioned to hydromorphone IV infusion for comfort care.   CAD (coronary artery disease) No chest pain or evidence of acute coronary syndrome.   Hyponatremia and Hypokalemia.  -previously repleted; now focusing on comfort care only -no further blood draws.  Subjective: Afebrile, no nausea or vomiting. Appears comfortable. No following commands currently and not eating.  Physical Exam: Vitals:   06/26/2023 0900 07/02/2023 1500 07/05/2023 2031 06/24/23 1100  BP:   (!) 123/96 (!) 158/145  Pulse: 90 (!) 112 92 (!) 52  Resp: 13 10 14 12   Temp:   99.1 F (37.3 C) 99.2 F (37.3 C)  TempSrc:   Oral Axillary  SpO2: 96%  94% 96%  Weight:       No  overnight events reported; patient has remained comfortable and resting for the most part. Family at bedside updated. No major distress appreciated.  Data Reviewed: No further blood work; patient is full comfort.   Family Communication: I spoke with patient's family at the bedside, we talked in detail about patient's condition, plan of care and prognosis and all questions were addressed.   Disposition: Status is: Inpatient Remains inpatient appropriate because: patient under comfort care, imminent death.  Continue full comfort care.   Planned Discharge Destination:  imminent death   Author: Vassie Loll, MD 06/28/2023 8:30 AM  For on call review [email or web address].

## 2023-06-28 NOTE — Progress Notes (Signed)
At families request contacted provider and increased Dilaudid to 5mg /hr. Patient was moaning and family was concerned that she may be in pain. Family at bedside throughout day.

## 2023-06-29 DIAGNOSIS — K222 Esophageal obstruction: Secondary | ICD-10-CM | POA: Diagnosis not present

## 2023-06-29 DIAGNOSIS — I4821 Permanent atrial fibrillation: Secondary | ICD-10-CM | POA: Diagnosis not present

## 2023-06-29 DIAGNOSIS — I5032 Chronic diastolic (congestive) heart failure: Secondary | ICD-10-CM | POA: Diagnosis not present

## 2023-06-29 DIAGNOSIS — I10 Essential (primary) hypertension: Secondary | ICD-10-CM | POA: Diagnosis not present

## 2023-06-29 MED ORDER — LORAZEPAM 2 MG/ML IJ SOLN
1.0000 mg | INTRAMUSCULAR | Status: DC | PRN
  Administered 2023-06-29 (×2): 1 mg via INTRAVENOUS
  Filled 2023-06-29 (×2): qty 1

## 2023-06-29 MED ORDER — DIAZEPAM 5 MG/ML IJ SOLN
2.5000 mg | Freq: Four times a day (QID) | INTRAMUSCULAR | Status: DC
  Administered 2023-06-29 – 2023-06-30 (×3): 2.5 mg via INTRAVENOUS
  Filled 2023-06-29 (×3): qty 2

## 2023-06-29 MED ORDER — SCOPOLAMINE 1 MG/3DAYS TD PT72
1.0000 | MEDICATED_PATCH | TRANSDERMAL | Status: DC
  Administered 2023-06-29: 1.5 mg via TRANSDERMAL
  Filled 2023-06-29: qty 1

## 2023-06-29 NOTE — Progress Notes (Signed)
Progress Note   Patient: [name] [name] ZOX:[id number] DOB: Sep 01, 1941 DOA: 06/13/2023     11 DOS: the patient was seen and examined on 06/29/2023   Brief hospital course: 82 year old female with a history of chronic back pain status post fusion, chronic lower extremity edema, coronary artery disease, HFmrEF (50-55%), permanent atrial fibrillation, hypertension, hyperlipidemia, restless leg syndrome, esophageal stricture, paraesophageal hernia status postsurgical repair, depression/anxiety presenting with increasing difficulty swallowing over the past month, worsening over the past 2 days.    Notably, the patient had a paraesophageal hernia repair, Heller myotomy, and gastropexy at [facility] on 02/17/22.  The patient had a complex postoperative history.  She began experiencing dysphagia again at the end of March 2023.  She was readmitted back to [facility] from 03/17/2022 to 04/13/2022.  She had a prolonged hospitalization which was complicated by development of atrial fibrillation with RVR and aspiration pneumonia.  She required intubation because of respiratory failure from her aspiration.  Subsequently, she underwent EGD on 03/27/2022 which showed severe postoperative esophageal narrowing at the GE junction dilated with a 10 to 12 mm CRE dilatation.  There is food noted in the stomach with obstruction views of the cardia.  After the EGD, the patient experienced pharyngoesophageal deficits impacting her swallowing.   After goals of care discussion with the patient's family they elected to proceed with a gastrostomy tube placement which was performed on 04/07/2022.  She was started on enteral feedings which she tolerated and ultimately discharged to a skilled nursing facility on 04/13/2022.   Thereafter, pt was ultimate evaluated by ENT and underwent FEES on 06/16/22 which suggested pt could begin liquid diet.  She continued to follow up with speech therapy with serial MBS studies and her diet was gradually  advanced.  Gastrostomy tube was removed in December 2023, and the patient has been eating by mouth since that period of time.  However over the next several months, she has noted increasing dysphagia type symptoms.  This has been worsening despite crushing her pills and eating softer food.  She has had to basically tolerate what amounts to a full liquid and soft diet.  However in the past few days prior to admission she has had increasing regurgitation, hypersalivation, and dry heaving.  She has had difficulty tolerating her pills.  She has had increasing shortness of breath over the past few days.  She denies any frank chest pain, fevers, chills, diarrhea, hematochezia, melena.  She has had some urinary frequency but denies any frank dysuria.  There is no hematemesis. Because she has had increasing difficulty tolerating pills she has not been able to take her cardiac medications or pain medications. In the ED, the patient was afebrile, but tachycardic in the 140s.  She was hemodynamically stable.  Oxygen saturation was 87% on 2 L.  She was placed on nasal cannula.  WBC 6.5, hemoglobin 9.6, platelets 171.  Sodium 131, potassium 3.7, bicarbonate 24, serum creatinine 0.50.  AST 216, ALT 210, alk phos 50-50, total bili 0.8.  EKG showed atrial fibrillation with RVR.  There is no ST-T wave changes.  CT chest and abdomen showed multiple airspace opacities bilaterally consistent with multifocal pneumonia.  There is moderate renal atrophy.  There is pancreatic atrophy.  There is a periumbilical hernia.  Stomach was within normal limits.  There is no bowel obstruction.  She was started on diltiazem drip, ceftriaxone, azithromycin.  Patient was diagnosed with aspiration pneumonia.  She was placed on IV antibiotic therapy. Unfortunately patient not  improving and very debilitated from her comorbid medical conditions.  Her family has decided to transition to comfort care.   Patient Dilaudid drip and a scheduled Valium  dose has been adjusted to further improve her hunger and distress symptoms.  Will continue with full comfort care.  Assessment and Plan: * Permanent atrial fibrillation (HCC) Uncontrolled atrial fibrillation, currently no apparent symptoms from tachycardia. Continue comfort care.   Aspiration pneumonia (HCC) Acute hypoxemic respiratory failure with hypoxemia.   Patient with poor prognosis, very deconditioned.  She was treated with IV zosyn and supplemental 02 per [place].   Plan to continue supplemental 02 per  for comfort, she has been transitioned to comfort care.  IV hydromorphone to control dyspnea and pain.  All antibiotics have been discontinued.  ESOPHAGEAL STRICTURE Dysphagia. Complex paraesophageal hernia sp repair 01/2022.  Patient not candidate for repeat invasive procedures due to risk of worsening respiratory failure.  Continue with comfort care.   Chronic diastolic CHF (congestive heart failure) (HCC) No clinical signs of decompensation.  Currently off diuretic therapy. She has been transitioned to full comfort care.   Essential hypertension Blood pressure has been stable.  Holding antihypertensive agents, risk of hypotension.   Severe protein-calorie malnutrition (HCC) Poor prognosis, currently not eating -patient in comfort care.   Major depression, recurrent, chronic (HCC) Chronic pain syndrome.   Patient on fentanyl at home, along with gabapentin.  She has been transitioned to hydromorphone IV infusion for comfort care.   CAD (coronary artery disease) No chest pain or evidence of acute coronary syndrome.   Hyponatremia and Hypokalemia.  -previously repleted; now focusing on comfort care only -no further blood draws.  Subjective: No fever, no nausea, no vomiting.  Restless, with increased abdominal breathing and demonstrating distress.  Physical Exam: Vitals:   07/09/2023 1500 06/21/2023 2031 06/24/23 1100 06/28/23 2202  BP:  (!) 123/96 (!) 158/145  118/77  Pulse: (!) 112 92 (!) 52 80  Resp: 10 14 12 16   Temp:  99.1 F (37.3 C) 99.2 F (37.3 C) (!) 100.7 F (38.2 C)  TempSrc:  Oral Axillary Oral  SpO2:  94% 96% 92%  Weight:       Patient was restless, demonstrating distress and ongoing anxiety.  Increased air hunger and agonal breathing appreciated.  Family at bedside updated.    Data Reviewed: No further blood work; patient is full comfort.   Family Communication: I spoke with patient's family at the bedside, we talked in detail about patient's condition, plan of care and prognosis and all questions were addressed.   Disposition: Status is: Inpatient Remains inpatient appropriate because: patient under comfort care, imminent death.  Continue full comfort care.   Planned Discharge Destination:  imminent death   Author: Vassie Loll, MD 06/29/2023 12:35 PM  For on call review [email or web address].

## 2023-06-29 NOTE — Progress Notes (Signed)
   06/29/23 1652  Spiritual Encounters  Type of Visit Follow up  Care provided to: [facility] present during encounter Nurse  Referral source IDT Rounds  Reason for visit End-of-life  OnCall Visit No  Spiritual Framework  Presenting Themes Meaning/purpose/sources of inspiration;Courage hope and growth;Significant life change  Community/Connection Family;Friend(s)  Patient Stress Factors None identified  Family Stress Factors Loss  Interventions  Spiritual Care Interventions Made Compassionate presence;Reflective listening;Normalization of emotions;Narrative/life review;Bereavement/grief support;Prayer  Intervention Outcomes  Outcomes Connection to spiritual care;Awareness of support;Reduced anxiety;Reduced fear  Spiritual Care Plan  Spiritual Care Issues Still Outstanding Chaplain will continue to follow   Found patient comfortable in hospital bed with daughter bedside. Daughter became tearful upon this writers entry into the room and asked for prayer. She shared that holding vigil is very hard for her emotionally. We talked about the process of dying and this Clinical research associate read her a few pages from a pamphlet called "Gone From My Sight". Daughter shared her understanding after education given and also stated that she appreciated the space to reflect and to unload her emotions today. Chaplain provided prayer to close the visit. Will remain available in order to provide spiritual support and to assess for spiritual need.   Rev. Jolyn Lent, M.Div Chaplain

## 2023-06-29 NOTE — Progress Notes (Signed)
Patient rested most of the night. Family remains at bedside. Given 1 prn medication during this shift. Comfort care. Plan of care ongoing.

## 2023-06-29 NOTE — Progress Notes (Signed)
Palliative: Mrs. Mounsey is full comfort care.  She is surrounded by her loving family.   Symptom management needs discussed with bedside nursing staff.  Antianxiety medications added.  No charge Lillia Carmel, NP Palliative medicine team Team phone [phone] Greater than 50% of this time was spent counseling and coordinating care related to the above assessment and plan.

## 2023-06-30 DIAGNOSIS — I5032 Chronic diastolic (congestive) heart failure: Secondary | ICD-10-CM | POA: Diagnosis not present

## 2023-06-30 DIAGNOSIS — J690 Pneumonitis due to inhalation of food and vomit: Secondary | ICD-10-CM | POA: Diagnosis not present

## 2023-06-30 DIAGNOSIS — I10 Essential (primary) hypertension: Secondary | ICD-10-CM | POA: Diagnosis not present

## 2023-06-30 DIAGNOSIS — I4821 Permanent atrial fibrillation: Secondary | ICD-10-CM | POA: Diagnosis not present

## 2023-06-30 MED ORDER — DIPHENHYDRAMINE HCL 50 MG/ML IJ SOLN
25.0000 mg | Freq: Once | INTRAMUSCULAR | Status: AC
  Administered 2023-06-30: 25 mg via INTRAVENOUS
  Filled 2023-06-30: qty 1

## 2023-06-30 MED ORDER — MORPHINE SULFATE (PF) 4 MG/ML IV SOLN
4.0000 mg | Freq: Once | INTRAVENOUS | Status: AC
  Administered 2023-06-30: 4 mg via INTRAVENOUS
  Filled 2023-06-30: qty 1

## 2023-07-22 NOTE — Progress Notes (Signed)
Patient is on a Dilaudid continuous drip @ 6mg /hr. Patient has received PRN Ativan and Dilaudid via bolus. Per the family neither of these seem to of been effective. Patient is continuously moaning out. Patient having increased respirations, 46-48. MD Zierle-Ghosh notified. Received order for one time dose of Benadryl and one time dose of Morphine. Medications given.

## 2023-07-22 NOTE — Progress Notes (Signed)
Patient passed away at 0402. Dilaudid continuous drip stopped, 58 cc wasted. Second verified by Roque Lias, RN.

## 2023-07-22 DEATH — deceased

## 2023-08-22 NOTE — Death Summary Note (Signed)
DEATH SUMMARY   Patient Details  Name: [name] [name] MRN: [id number] DOB: July 15, 1941 QIO:NGEXB, [name] Sicilian, NP Admission/Discharge Information   Admit Date:  02-Jul-2023  Date of Death: Date of Death: 07/14/2023  Time of Death: Time of Death: 0402  Length of Stay: 08/16/23   Principle Cause of death: aspiration pneumonia.  Hospital Diagnoses: Principal Problem:   Permanent atrial fibrillation (HCC) Active Problems:   Aspiration pneumonia (HCC)   ESOPHAGEAL STRICTURE   Chronic diastolic CHF (congestive heart failure) (HCC)   Essential hypertension   Severe protein-calorie malnutrition (HCC)   Major depression, recurrent, chronic (HCC)   CAD (coronary artery disease)   Hyponatremia Chronic pain syndrome Hypokalemia  Hospital Course: 82 year old female with a history of chronic back pain status post fusion, chronic lower extremity edema, coronary artery disease, HFmrEF (50-55%), permanent atrial fibrillation, hypertension, hyperlipidemia, restless leg syndrome, esophageal stricture, paraesophageal hernia status postsurgical repair, depression/anxiety presenting with increasing difficulty swallowing over the past month, worsening over the past 2 days.    Notably, the patient had a paraesophageal hernia repair, Heller myotomy, and gastropexy at [facility] on 02/17/22.  The patient had a complex postoperative history.  She began experiencing dysphagia again at the end of March 2023.  She was readmitted back to [facility] from 03/17/2022 to 04/13/2022.  She had a prolonged hospitalization which was complicated by development of atrial fibrillation with RVR and aspiration pneumonia.  She required intubation because of respiratory failure from her aspiration.  Subsequently, she underwent EGD on 03/27/2022 which showed severe postoperative esophageal narrowing at the GE junction dilated with a 10 to 12 mm CRE dilatation.  There is food noted in the stomach with obstruction views of the cardia.  After the EGD, the  patient experienced pharyngoesophageal deficits impacting her swallowing.   After goals of care discussion with the patient's family they elected to proceed with a gastrostomy tube placement which was performed on 04/07/2022.  She was started on enteral feedings which she tolerated and ultimately discharged to a skilled nursing facility on 04/13/2022.   Thereafter, pt was ultimate evaluated by ENT and underwent FEES on 06/16/22 which suggested pt could begin liquid diet.  She continued to follow up with speech therapy with serial MBS studies and her diet was gradually advanced.  Gastrostomy tube was removed in December 2023, and the patient has been eating by mouth since that period of time.  However over the next several months, she has noted increasing dysphagia type symptoms.  This has been worsening despite crushing her pills and eating softer food.  She has had to basically tolerate what amounts to a full liquid and soft diet.  However in the past few days prior to admission she has had increasing regurgitation, hypersalivation, and dry heaving.  She has had difficulty tolerating her pills.  She has had increasing shortness of breath over the past few days.  She denies any frank chest pain, fevers, chills, diarrhea, hematochezia, melena.  She has had some urinary frequency but denies any frank dysuria.  There is no hematemesis. Because she has had increasing difficulty tolerating pills she has not been able to take her cardiac medications or pain medications. In the ED, the patient was afebrile, but tachycardic in the 140s.  She was hemodynamically stable.  Oxygen saturation was 87% on 2 L.  She was placed on nasal cannula.  WBC 6.5, hemoglobin 9.6, platelets 171.  Sodium 131, potassium 3.7, bicarbonate 24, serum creatinine 0.50.  AST 216, ALT 210, alk  phos 50-50, total bili 0.8.  EKG showed atrial fibrillation with RVR.  There is no ST-T wave changes.  CT chest and abdomen showed multiple airspace opacities  bilaterally consistent with multifocal pneumonia.  There is moderate renal atrophy.  There is pancreatic atrophy.  There is a periumbilical hernia.  Stomach was within normal limits.  There is no bowel obstruction.  She was started on diltiazem drip, ceftriaxone, azithromycin.  Patient was diagnosed with aspiration pneumonia.  She was placed on IV antibiotic therapy. Unfortunately patient not improving and very debilitated from her comorbid medical conditions.  Her family has decided to transition to comfort care. After adequate treatment/comfort measures initiated patient peacefully expired on 06/26/2023 at 4:02 AM.  Procedures: please review x-ray reports for details (CXR, esophagram/upper GI series) and 2-Decho.  Consultations: GI, palliative care  The results of significant diagnostics from this hospitalization (including imaging, microbiology, ancillary and laboratory) are listed below for reference.   Significant Diagnostic Studies: No results found.  Microbiology: No results found for this or any previous visit (from the past 240 hour(s)).  Time spent: 25 minutes  Signed: Vassie Loll, MD 07/16/2023

## 2023-12-03 ENCOUNTER — Ambulatory Visit: Payer: Medicare HMO | Admitting: Cardiovascular Disease
